# Patient Record
Sex: Male | Born: 1945 | Race: Black or African American | Hispanic: No | Marital: Married | Smoking: Former smoker
Health system: Southern US, Community
[De-identification: ages and names within clinical notes are randomized; demographics above are authoritative.]

## PROBLEM LIST (undated history)

## (undated) DIAGNOSIS — Z5189 Encounter for other specified aftercare: Secondary | ICD-10-CM

## (undated) DIAGNOSIS — Z8619 Personal history of other infectious and parasitic diseases: Secondary | ICD-10-CM

## (undated) DIAGNOSIS — Z940 Kidney transplant status: Secondary | ICD-10-CM

## (undated) DIAGNOSIS — Z7409 Other reduced mobility: Secondary | ICD-10-CM

## (undated) DIAGNOSIS — I219 Acute myocardial infarction, unspecified: Secondary | ICD-10-CM

## (undated) DIAGNOSIS — Z86718 Personal history of other venous thrombosis and embolism: Secondary | ICD-10-CM

## (undated) DIAGNOSIS — IMO0001 Reserved for inherently not codable concepts without codable children: Secondary | ICD-10-CM

## (undated) DIAGNOSIS — N186 End stage renal disease: Secondary | ICD-10-CM

## (undated) DIAGNOSIS — T86290 Cardiac allograft vasculopathy: Secondary | ICD-10-CM

## (undated) DIAGNOSIS — I255 Ischemic cardiomyopathy: Secondary | ICD-10-CM

## (undated) DIAGNOSIS — I739 Peripheral vascular disease, unspecified: Secondary | ICD-10-CM

## (undated) DIAGNOSIS — E119 Type 2 diabetes mellitus without complications: Secondary | ICD-10-CM

## (undated) DIAGNOSIS — D649 Anemia, unspecified: Secondary | ICD-10-CM

## (undated) DIAGNOSIS — Z941 Heart transplant status: Secondary | ICD-10-CM

## (undated) DIAGNOSIS — I10 Essential (primary) hypertension: Secondary | ICD-10-CM

## (undated) HISTORY — DX: Peripheral vascular disease, unspecified: I73.9

## (undated) HISTORY — PX: AV FISTULA REPAIR: SHX563

## (undated) HISTORY — PX: CORONARY ANGIOPLASTY WITH STENT PLACEMENT: SHX49

## (undated) HISTORY — PX: AV FISTULA PLACEMENT: SHX1204

---

## 1988-05-27 HISTORY — PX: CORONARY ARTERY BYPASS GRAFT: SHX141

## 1997-10-11 ENCOUNTER — Encounter: Admission: RE | Admit: 1997-10-11 | Discharge: 1998-01-09 | Payer: Self-pay | Admitting: Family Medicine

## 1997-11-08 ENCOUNTER — Emergency Department (HOSPITAL_COMMUNITY): Admission: EM | Admit: 1997-11-08 | Discharge: 1997-11-08 | Payer: Self-pay | Admitting: Emergency Medicine

## 1998-01-01 ENCOUNTER — Inpatient Hospital Stay (HOSPITAL_COMMUNITY): Admission: EM | Admit: 1998-01-01 | Discharge: 1998-01-02 | Payer: Self-pay | Admitting: Emergency Medicine

## 1998-05-16 ENCOUNTER — Encounter: Payer: Self-pay | Admitting: Emergency Medicine

## 1998-05-16 ENCOUNTER — Observation Stay (HOSPITAL_COMMUNITY): Admission: EM | Admit: 1998-05-16 | Discharge: 1998-05-17 | Payer: Self-pay | Admitting: Emergency Medicine

## 1998-08-16 ENCOUNTER — Encounter: Payer: Self-pay | Admitting: Emergency Medicine

## 1998-08-16 ENCOUNTER — Emergency Department (HOSPITAL_COMMUNITY): Admission: EM | Admit: 1998-08-16 | Discharge: 1998-08-16 | Payer: Self-pay | Admitting: Emergency Medicine

## 1998-10-23 ENCOUNTER — Observation Stay (HOSPITAL_COMMUNITY): Admission: EM | Admit: 1998-10-23 | Discharge: 1998-10-23 | Payer: Self-pay | Admitting: *Deleted

## 1998-10-23 ENCOUNTER — Encounter: Payer: Self-pay | Admitting: *Deleted

## 1998-11-06 ENCOUNTER — Ambulatory Visit (HOSPITAL_COMMUNITY): Admission: RE | Admit: 1998-11-06 | Discharge: 1998-11-06 | Payer: Self-pay | Admitting: Interventional Cardiology

## 1999-01-22 ENCOUNTER — Encounter: Payer: Self-pay | Admitting: *Deleted

## 1999-01-22 ENCOUNTER — Emergency Department (HOSPITAL_COMMUNITY): Admission: EM | Admit: 1999-01-22 | Discharge: 1999-01-22 | Payer: Self-pay | Admitting: Emergency Medicine

## 1999-06-28 HISTORY — PX: HEART TRANSPLANT: SHX268

## 1999-07-26 ENCOUNTER — Ambulatory Visit (HOSPITAL_COMMUNITY): Admission: RE | Admit: 1999-07-26 | Discharge: 1999-07-26 | Payer: Self-pay | Admitting: Interventional Cardiology

## 2003-02-02 ENCOUNTER — Emergency Department (HOSPITAL_COMMUNITY): Admission: EM | Admit: 2003-02-02 | Discharge: 2003-02-02 | Payer: Self-pay | Admitting: Emergency Medicine

## 2003-02-04 ENCOUNTER — Emergency Department (HOSPITAL_COMMUNITY): Admission: EM | Admit: 2003-02-04 | Discharge: 2003-02-04 | Payer: Self-pay | Admitting: Emergency Medicine

## 2003-02-04 ENCOUNTER — Encounter: Payer: Self-pay | Admitting: Emergency Medicine

## 2003-05-13 ENCOUNTER — Emergency Department (HOSPITAL_COMMUNITY): Admission: EM | Admit: 2003-05-13 | Discharge: 2003-05-13 | Payer: Self-pay | Admitting: Emergency Medicine

## 2003-05-14 ENCOUNTER — Inpatient Hospital Stay (HOSPITAL_COMMUNITY): Admission: EM | Admit: 2003-05-14 | Discharge: 2003-05-23 | Payer: Self-pay

## 2003-06-09 ENCOUNTER — Inpatient Hospital Stay (HOSPITAL_COMMUNITY): Admission: EM | Admit: 2003-06-09 | Discharge: 2003-06-14 | Payer: Self-pay | Admitting: Emergency Medicine

## 2003-06-16 ENCOUNTER — Encounter: Admission: RE | Admit: 2003-06-16 | Discharge: 2003-06-16 | Payer: Self-pay | Admitting: Family Medicine

## 2003-07-15 ENCOUNTER — Ambulatory Visit (HOSPITAL_COMMUNITY): Admission: RE | Admit: 2003-07-15 | Discharge: 2003-07-15 | Payer: Self-pay | Admitting: Nephrology

## 2003-07-21 ENCOUNTER — Emergency Department (HOSPITAL_COMMUNITY): Admission: EM | Admit: 2003-07-21 | Discharge: 2003-07-22 | Payer: Self-pay | Admitting: Emergency Medicine

## 2003-07-23 ENCOUNTER — Inpatient Hospital Stay (HOSPITAL_COMMUNITY): Admission: EM | Admit: 2003-07-23 | Discharge: 2003-07-25 | Payer: Self-pay | Admitting: Emergency Medicine

## 2003-08-17 ENCOUNTER — Ambulatory Visit (HOSPITAL_COMMUNITY): Admission: RE | Admit: 2003-08-17 | Discharge: 2003-08-17 | Payer: Self-pay | Admitting: Nephrology

## 2003-11-23 ENCOUNTER — Ambulatory Visit (HOSPITAL_COMMUNITY): Admission: RE | Admit: 2003-11-23 | Discharge: 2003-11-23 | Payer: Self-pay | Admitting: Nephrology

## 2003-11-29 ENCOUNTER — Ambulatory Visit (HOSPITAL_COMMUNITY): Admission: RE | Admit: 2003-11-29 | Discharge: 2003-11-29 | Payer: Self-pay | Admitting: Vascular Surgery

## 2004-05-16 ENCOUNTER — Ambulatory Visit (HOSPITAL_COMMUNITY): Admission: RE | Admit: 2004-05-16 | Discharge: 2004-05-16 | Payer: Self-pay | Admitting: Nephrology

## 2004-08-20 ENCOUNTER — Ambulatory Visit (HOSPITAL_COMMUNITY): Admission: RE | Admit: 2004-08-20 | Discharge: 2004-08-20 | Payer: Self-pay | Admitting: Nephrology

## 2004-09-12 ENCOUNTER — Ambulatory Visit (HOSPITAL_COMMUNITY): Admission: RE | Admit: 2004-09-12 | Discharge: 2004-09-12 | Payer: Self-pay | Admitting: Vascular Surgery

## 2004-09-12 ENCOUNTER — Emergency Department (HOSPITAL_COMMUNITY): Admission: EM | Admit: 2004-09-12 | Discharge: 2004-09-12 | Payer: Self-pay | Admitting: Emergency Medicine

## 2004-10-24 ENCOUNTER — Ambulatory Visit (HOSPITAL_COMMUNITY): Admission: RE | Admit: 2004-10-24 | Discharge: 2004-10-24 | Payer: Self-pay | Admitting: Nephrology

## 2004-11-23 ENCOUNTER — Ambulatory Visit (HOSPITAL_COMMUNITY): Admission: RE | Admit: 2004-11-23 | Discharge: 2004-11-23 | Payer: Self-pay | Admitting: Nephrology

## 2005-05-02 ENCOUNTER — Ambulatory Visit (HOSPITAL_COMMUNITY): Admission: RE | Admit: 2005-05-02 | Discharge: 2005-05-02 | Payer: Self-pay | Admitting: Nephrology

## 2005-09-17 ENCOUNTER — Ambulatory Visit (HOSPITAL_COMMUNITY): Admission: RE | Admit: 2005-09-17 | Discharge: 2005-09-17 | Payer: Self-pay | Admitting: *Deleted

## 2005-11-04 ENCOUNTER — Ambulatory Visit (HOSPITAL_COMMUNITY): Admission: RE | Admit: 2005-11-04 | Discharge: 2005-11-04 | Payer: Self-pay | Admitting: Nephrology

## 2006-05-21 ENCOUNTER — Emergency Department (HOSPITAL_COMMUNITY): Admission: EM | Admit: 2006-05-21 | Discharge: 2006-05-21 | Payer: Self-pay | Admitting: Emergency Medicine

## 2006-05-27 HISTORY — PX: NEPHRECTOMY TRANSPLANTED ORGAN: SUR880

## 2006-05-29 ENCOUNTER — Ambulatory Visit (HOSPITAL_COMMUNITY): Admission: RE | Admit: 2006-05-29 | Discharge: 2006-05-29 | Payer: Self-pay | Admitting: Nephrology

## 2006-11-18 ENCOUNTER — Ambulatory Visit: Payer: Self-pay | Admitting: Vascular Surgery

## 2006-11-18 ENCOUNTER — Ambulatory Visit: Admission: RE | Admit: 2006-11-18 | Discharge: 2006-11-18 | Payer: Self-pay | Admitting: Internal Medicine

## 2007-01-02 ENCOUNTER — Ambulatory Visit (HOSPITAL_COMMUNITY): Admission: RE | Admit: 2007-01-02 | Discharge: 2007-01-02 | Payer: Self-pay | Admitting: Internal Medicine

## 2007-05-18 ENCOUNTER — Emergency Department (HOSPITAL_COMMUNITY): Admission: EM | Admit: 2007-05-18 | Discharge: 2007-05-18 | Payer: Self-pay | Admitting: Emergency Medicine

## 2008-03-24 ENCOUNTER — Ambulatory Visit (HOSPITAL_COMMUNITY): Admission: RE | Admit: 2008-03-24 | Discharge: 2008-03-24 | Payer: Self-pay | Admitting: Internal Medicine

## 2008-06-02 ENCOUNTER — Ambulatory Visit: Payer: Self-pay | Admitting: Internal Medicine

## 2008-06-02 ENCOUNTER — Inpatient Hospital Stay (HOSPITAL_COMMUNITY): Admission: EM | Admit: 2008-06-02 | Discharge: 2008-06-04 | Payer: Self-pay | Admitting: Emergency Medicine

## 2008-08-27 ENCOUNTER — Emergency Department (HOSPITAL_COMMUNITY): Admission: EM | Admit: 2008-08-27 | Discharge: 2008-08-27 | Payer: Self-pay | Admitting: Emergency Medicine

## 2009-09-27 ENCOUNTER — Inpatient Hospital Stay (HOSPITAL_COMMUNITY): Admission: EM | Admit: 2009-09-27 | Discharge: 2009-10-04 | Payer: Self-pay | Admitting: Emergency Medicine

## 2009-09-27 ENCOUNTER — Ambulatory Visit: Payer: Self-pay | Admitting: Cardiology

## 2009-09-28 ENCOUNTER — Encounter (INDEPENDENT_AMBULATORY_CARE_PROVIDER_SITE_OTHER): Payer: Self-pay | Admitting: Internal Medicine

## 2009-09-29 ENCOUNTER — Ambulatory Visit: Payer: Self-pay | Admitting: Vascular Surgery

## 2010-02-15 ENCOUNTER — Ambulatory Visit: Payer: Self-pay | Admitting: Vascular Surgery

## 2010-02-21 ENCOUNTER — Ambulatory Visit: Payer: Self-pay | Admitting: Vascular Surgery

## 2010-02-21 ENCOUNTER — Ambulatory Visit (HOSPITAL_COMMUNITY): Admission: RE | Admit: 2010-02-21 | Discharge: 2010-02-21 | Payer: Self-pay | Admitting: Vascular Surgery

## 2010-03-08 ENCOUNTER — Ambulatory Visit: Payer: Self-pay | Admitting: Vascular Surgery

## 2010-06-14 ENCOUNTER — Ambulatory Visit
Admission: RE | Admit: 2010-06-14 | Discharge: 2010-06-14 | Payer: Self-pay | Source: Home / Self Care | Attending: Vascular Surgery | Admitting: Vascular Surgery

## 2010-06-14 ENCOUNTER — Ambulatory Visit: Admit: 2010-06-14 | Payer: Self-pay | Admitting: Vascular Surgery

## 2010-06-15 NOTE — Assessment & Plan Note (Signed)
OFFICE VISIT  Brandon Sharp, Brandon Sharp DOB:  01-17-1946                                       06/14/2010 [id number]#:[id number]  The patient returns for followup today.  He was last seen in October 2011.  He previously had ligation of the left upper arm graft for venous hypertension.  He had creation of a right brachiocephalic AV fistula with patch angioplasty of the cephalic vein on February 21, 2010.  He returns today for further followup.  He is currently not on dialysis. He denies any swelling or numbness or tingling in his right hand associated with the fistula.  REVIEW OF SYSTEMS:  He denies any shortness of breath or chest pain.  PHYSICAL EXAM:  Vital signs:  Blood pressure is 190/87 in the left arm, heart rate 68 and regular, respirations 18.  He has an easily palpable thrill in the right upper arm AV fistula.  It is dilated proximally but not as dilated distally.  There is an audible bruit throughout the fistula.  He had a graft duplex scan today which shows a high-grade stenosis with increased velocity, approximately 3-5 fingerbreadths above the antecubital fossa.  Velocities were 6.97 meters per second.  The fistula has increased in diameter throughout most of its course and is between 6-8 mm in diameter.  In summary, I believe the patient needs revision of his right arm AV fistula for a possible narrowing to prevent occlusion of this.  The fistula seems to be maturing throughout most of its course and hopefully by eliminating this stenosis, it would be a good usable access for him if he requires it in the future.  This would also be done to prevent occlusion of the fistula in the near term.  All this was explained to the patient today as well as risks, benefits, possible complications of procedure including but not limited to bleeding, infection, fistula thrombosis, non-maturation of the fistula.  He understands and agrees to proceed.    Janetta Hora.  Fields, MD Electronically Signed  CEF/MEDQ  D:  06/14/2010  T:  06/15/2010  Job:  [id number]  cc:   Garnetta Buddy, M.D.

## 2010-06-22 NOTE — Procedures (Unsigned)
VASCULAR LAB EXAM  INDICATION:  Follow up right arteriovenous fistula.  HISTORY: Diabetes:  No. Cardiac:  No. Hypertension:  Yes.  EXAM:  IMPRESSION: 1. Patent right brachiocephalic Cimino fistula with stenosis 3-5     fingers above the antecubital fossa, measuring 0.44 cm with     velocities at 6.97 m/s. 2. Multiple branches off the cephalic vein were visualized.  Discussed findings with Dr. Darrick Penna.  ___________________________________________ Janetta Hora Fields, MD  OD/MEDQ  D:  06/14/2010  T:  06/14/2010  Job:  [id number]

## 2010-07-02 ENCOUNTER — Ambulatory Visit (HOSPITAL_COMMUNITY): Admission: RE | Admit: 2010-07-02 | Payer: Self-pay | Source: Ambulatory Visit | Admitting: Vascular Surgery

## 2010-07-09 ENCOUNTER — Ambulatory Visit (HOSPITAL_COMMUNITY)
Admission: RE | Admit: 2010-07-09 | Discharge: 2010-07-09 | Disposition: A | Discharge status: Home / Self Care | Payer: Medicare Other | Source: Ambulatory Visit | Attending: Vascular Surgery | Admitting: Vascular Surgery

## 2010-07-09 DIAGNOSIS — N186 End stage renal disease: Secondary | ICD-10-CM

## 2010-07-09 DIAGNOSIS — T82898A Other specified complication of vascular prosthetic devices, implants and grafts, initial encounter: Secondary | ICD-10-CM

## 2010-07-09 DIAGNOSIS — Z794 Long term (current) use of insulin: Secondary | ICD-10-CM | POA: Insufficient documentation

## 2010-07-09 DIAGNOSIS — Y849 Medical procedure, unspecified as the cause of abnormal reaction of the patient, or of later complication, without mention of misadventure at the time of the procedure: Secondary | ICD-10-CM | POA: Insufficient documentation

## 2010-07-09 DIAGNOSIS — E119 Type 2 diabetes mellitus without complications: Secondary | ICD-10-CM | POA: Insufficient documentation

## 2010-07-09 DIAGNOSIS — I120 Hypertensive chronic kidney disease with stage 5 chronic kidney disease or end stage renal disease: Secondary | ICD-10-CM

## 2010-07-09 DIAGNOSIS — T82598A Other mechanical complication of other cardiac and vascular devices and implants, initial encounter: Secondary | ICD-10-CM | POA: Insufficient documentation

## 2010-07-09 LAB — GLUCOSE, CAPILLARY
Glucose-Capillary: 57 mg/dL — ABNORMAL LOW (ref 70–99)
Glucose-Capillary: 139 mg/dL — ABNORMAL HIGH (ref 70–99)

## 2010-07-09 LAB — POCT I-STAT 4, (NA,K, GLUC, HGB,HCT)
HCT: 31 % — ABNORMAL LOW (ref 39.0–52.0)
Hemoglobin: 10.5 g/dL — ABNORMAL LOW (ref 13.0–17.0)

## 2010-07-11 LAB — GLUCOSE, CAPILLARY: Glucose-Capillary: 87 mg/dL (ref 70–99)

## 2010-07-18 NOTE — Op Note (Signed)
NAME:  Brandon Sharp, Brandon Sharp              ACCOUNT NO.:  [id number]  MEDICAL RECORD NO.:  [id number]           PATIENT TYPE:  O  LOCATION:  [facility]                         FACILITY:  [facility]  PHYSICIAN:  Aariel Ems E. Zachory Mangual, MD  DATE OF BIRTH:  May 16, 1946  DATE OF PROCEDURE:  07/09/2010 DATE OF DISCHARGE:  07/09/2010                              OPERATIVE REPORT   PREOPERATIVE DIAGNOSIS:  Non-maturing right arm arteriovenous fistula.  POSTOPERATIVE DIAGNOSIS:  Non-maturing right arm arteriovenous fistula.  ANESTHESIA:  Local with IV sedation.  SURGEON:  Janetta Hora. Arayah Krouse, MD  ASSISTANT:  Pecola Leisure, PA-C.  PROCEDURE:  Patch angioplasty of right arm arteriovenous fistula with ligation of multiple side branches.  OPERATIVE FINDINGS:  Bovine pericardial patch.  OPERATIVE DETAILS:  After obtaining informed consent, the patient was taken to the operating room.  The patient was placed in supine position on the operating table.  After adequate sedation, the patient's entire right upper extremity was prepped and draped in the usual sterile fashion.  Local anesthesia was infiltrated at a preexisting longitudinal scar several centimeters above the antecubital crease.  Incision was reopened and carried down through the subcutaneous tissues down the level of a preexisting brachiocephalic AV fistula.  There had been a previous patch angioplasty of the more proximal segment of this.  This appeared to be widely patent and ultrasound also confirmed that there was a narrowing more distal to this.  Therefore, the incision was extended more longitudinally up the arm for several centimeters.  Total length of the incision was approximately 5 cm.  There was an additional narrowed spot at about 4 cm above the previous patch.  This appeared to be an area of previous needle stick or hypertrophied valve.  Vein was dissected free circumferentially at this level.  There were also 2 very large side branches  coming off the fistula and these were ligated and divided between silk ties.  The patient was given 5000 units of intravenous heparin.  The fistula was clamped proximally and distally above the area of narrowing.  A longitudinal opening was made in the narrowing.  There was thickening of the vein as well as a hypertrophied valve and some synechiae.  This segment of vein was resected approximately 1 cm.  The posterior wall was then repaired using a running 6-0 Prolene suture.  A bovine pericardial patch was then brought up in the operative field and sewn on the anterior wall as patch angioplasty using running 6-0 Prolene suture.  Just prior to completion of the anastomosis, everything was forebled, backbled, and thoroughly flushed.  Anastomosis was secured.  Clamps were released.  There was good palpable thrill in the fistula immediately.  Hemostasis was obtained with additional repair stitch.  The patient was also given 50 mg of protamine.  Subcutaneous tissues were reapproximated using running 3-0 Vicryl sutures.  Skin was closed with 4-0 Vicryl subcuticular stitch.  The patient tolerated the procedure well and there were no complications. Instrument, sponge, and needle counts were correct at the end of the case.  The patient was taken to the recovery room in stable condition.  Janetta Hora. Zenda Herskowitz, MD     CEF/MEDQ  D:  07/09/2010  T:  07/10/2010  Job:  [id number]  Electronically Signed by Fabienne Bruns MD on 07/18/2010 03:09:02 PM

## 2010-07-19 ENCOUNTER — Emergency Department (HOSPITAL_COMMUNITY): Payer: Medicare Other

## 2010-07-19 ENCOUNTER — Observation Stay (HOSPITAL_COMMUNITY)
Admission: EM | Admit: 2010-07-19 | Discharge: 2010-07-23 | Disposition: A | Discharge status: Home / Self Care | Payer: Medicare Other | Attending: Family Medicine | Admitting: Family Medicine

## 2010-07-19 DIAGNOSIS — A630 Anogenital (venereal) warts: Secondary | ICD-10-CM | POA: Insufficient documentation

## 2010-07-19 DIAGNOSIS — E1169 Type 2 diabetes mellitus with other specified complication: Principal | ICD-10-CM | POA: Insufficient documentation

## 2010-07-19 DIAGNOSIS — R4182 Altered mental status, unspecified: Secondary | ICD-10-CM | POA: Insufficient documentation

## 2010-07-19 DIAGNOSIS — I5022 Chronic systolic (congestive) heart failure: Secondary | ICD-10-CM | POA: Insufficient documentation

## 2010-07-19 DIAGNOSIS — X31XXXA Exposure to excessive natural cold, initial encounter: Secondary | ICD-10-CM | POA: Insufficient documentation

## 2010-07-19 DIAGNOSIS — I129 Hypertensive chronic kidney disease with stage 1 through stage 4 chronic kidney disease, or unspecified chronic kidney disease: Secondary | ICD-10-CM | POA: Insufficient documentation

## 2010-07-19 DIAGNOSIS — D696 Thrombocytopenia, unspecified: Secondary | ICD-10-CM | POA: Insufficient documentation

## 2010-07-19 DIAGNOSIS — Z941 Heart transplant status: Secondary | ICD-10-CM | POA: Insufficient documentation

## 2010-07-19 DIAGNOSIS — T68XXXA Hypothermia, initial encounter: Secondary | ICD-10-CM | POA: Insufficient documentation

## 2010-07-19 DIAGNOSIS — D649 Anemia, unspecified: Secondary | ICD-10-CM | POA: Insufficient documentation

## 2010-07-19 DIAGNOSIS — K5900 Constipation, unspecified: Secondary | ICD-10-CM | POA: Insufficient documentation

## 2010-07-19 DIAGNOSIS — Z794 Long term (current) use of insulin: Secondary | ICD-10-CM | POA: Insufficient documentation

## 2010-07-19 DIAGNOSIS — N184 Chronic kidney disease, stage 4 (severe): Secondary | ICD-10-CM | POA: Insufficient documentation

## 2010-07-19 LAB — POCT I-STAT, CHEM 8
Chloride: 110 mEq/L (ref 96–112)
Creatinine, Ser: 4.9 mg/dL — ABNORMAL HIGH (ref 0.4–1.5)
Glucose, Bld: 79 mg/dL (ref 70–99)
HCT: 31 % — ABNORMAL LOW (ref 39.0–52.0)
Hemoglobin: 10.5 g/dL — ABNORMAL LOW (ref 13.0–17.0)
Potassium: 4.1 mEq/L (ref 3.5–5.1)
Sodium: 145 mEq/L (ref 135–145)
TCO2: 27 mmol/L (ref 0–100)

## 2010-07-19 LAB — GLUCOSE, CAPILLARY
Glucose-Capillary: 162 mg/dL — ABNORMAL HIGH (ref 70–99)
Glucose-Capillary: 168 mg/dL — ABNORMAL HIGH (ref 70–99)
Glucose-Capillary: 187 mg/dL — ABNORMAL HIGH (ref 70–99)

## 2010-07-19 LAB — PROCALCITONIN: Procalcitonin: 0.12 ng/mL

## 2010-07-19 LAB — CBC
MCV: 99.4 fL (ref 78.0–100.0)
Platelets: 108 10*3/uL — ABNORMAL LOW (ref 150–400)

## 2010-07-19 LAB — LACTIC ACID, PLASMA: Lactic Acid, Venous: 0.9 mmol/L (ref 0.5–2.2)

## 2010-07-19 LAB — DIFFERENTIAL
Basophils Absolute: 0 10*3/uL (ref 0.0–0.1)
Basophils Relative: 0 % (ref 0–1)

## 2010-07-20 LAB — URINALYSIS, MICROSCOPIC ONLY
Hgb urine dipstick: NEGATIVE
Ketones, ur: NEGATIVE mg/dL
Protein, ur: 100 mg/dL — AB
Urobilinogen, UA: 0.2 mg/dL (ref 0.0–1.0)

## 2010-07-20 LAB — GLUCOSE, CAPILLARY
Glucose-Capillary: 45 mg/dL — ABNORMAL LOW (ref 70–99)
Glucose-Capillary: 77 mg/dL (ref 70–99)
Glucose-Capillary: 218 mg/dL — ABNORMAL HIGH (ref 70–99)

## 2010-07-21 LAB — BASIC METABOLIC PANEL
CO2: 27 mEq/L (ref 19–32)
Chloride: 107 mEq/L (ref 96–112)
GFR calc Af Amer: 16 mL/min — ABNORMAL LOW (ref >60)
Potassium: 3.8 mEq/L (ref 3.5–5.1)
Sodium: 144 mEq/L (ref 135–145)

## 2010-07-21 LAB — CBC
Hemoglobin: 10 g/dL — ABNORMAL LOW (ref 13.0–17.0)
MCH: 30.2 pg (ref 26.0–34.0)
RBC: 3.31 MIL/uL — ABNORMAL LOW (ref 4.22–5.81)
WBC: 4.3 10*3/uL (ref 4.0–10.5)

## 2010-07-21 LAB — GLUCOSE, CAPILLARY: Glucose-Capillary: 128 mg/dL — ABNORMAL HIGH (ref 70–99)

## 2010-07-22 LAB — URINE CULTURE: Colony Count: 10000

## 2010-07-22 LAB — BASIC METABOLIC PANEL
CO2: 28 mEq/L (ref 19–32)
Calcium: 8.5 mg/dL (ref 8.4–10.5)
Chloride: 104 mEq/L (ref 96–112)
Creatinine, Ser: 4.28 mg/dL — ABNORMAL HIGH (ref 0.4–1.5)
GFR calc Af Amer: 17 mL/min — ABNORMAL LOW (ref >60)
Sodium: 140 mEq/L (ref 135–145)

## 2010-07-22 LAB — GLUCOSE, CAPILLARY
Glucose-Capillary: 109 mg/dL — ABNORMAL HIGH (ref 70–99)
Glucose-Capillary: 241 mg/dL — ABNORMAL HIGH (ref 70–99)
Glucose-Capillary: 195 mg/dL — ABNORMAL HIGH (ref 70–99)

## 2010-07-23 LAB — BASIC METABOLIC PANEL
Calcium: 8.9 mg/dL (ref 8.4–10.5)
GFR calc Af Amer: 17 mL/min — ABNORMAL LOW (ref >60)
GFR calc non Af Amer: 14 mL/min — ABNORMAL LOW (ref >60)
Glucose, Bld: 125 mg/dL — ABNORMAL HIGH (ref 70–99)
Potassium: 4 mEq/L (ref 3.5–5.1)
Sodium: 144 mEq/L (ref 135–145)

## 2010-07-25 ENCOUNTER — Encounter (HOSPITAL_COMMUNITY): Payer: Medicare Other | Attending: Urology

## 2010-07-25 DIAGNOSIS — Z139 Encounter for screening, unspecified: Secondary | ICD-10-CM | POA: Insufficient documentation

## 2010-07-26 ENCOUNTER — Ambulatory Visit (INDEPENDENT_AMBULATORY_CARE_PROVIDER_SITE_OTHER): Payer: Medicare Other

## 2010-07-26 DIAGNOSIS — N186 End stage renal disease: Secondary | ICD-10-CM

## 2010-07-26 LAB — CULTURE, BLOOD (ROUTINE X 2)
Culture  Setup Time: 201202240124
Culture: NO GROWTH

## 2010-07-26 NOTE — Assessment & Plan Note (Signed)
OFFICE VISIT  Brandon Sharp, Brandon Sharp DOB:  April 28, 1946                                       07/26/2010 [id number]#:[id number]  DATE OF SURGERY:  07/09/2010.  The patient presents today for follow-up of patch angioplasty of right arm, AV fistula with ligation of multiple side branches.  Procedure was performed in order to aid with maturation of his right upper arm AV fistula.  The patient is doing well and is without complaint.  He denies signs and symptoms of steal.  The patient is not on dialysis at this time.  PHYSICAL EXAMINATION:  There are 2+ radial and ulnar pulses present in the right upper extremity.  There is a great thrill in the fistula.  The incision is clean, dry and intact and healing well.  Hand is warm.  Cap refill is good.  Motor and sensation are intact in the right upper extremity.  ASSESSMENT/PLAN:  Nonmaturing right arm AV fistula status post patch angioplasty with bovine pericardial patch and ligation of multiple side branches.  PLAN:  The patient is doing well.  The fistula has an excellent thrill but at this time has not matured fully.  The patient will follow up in 3 months with Dr. Darrick Penna with a graft ultrasound to reassess status.  The patient is not on hemodialysis, and if he were to require initiation prior to his follow-up, the nephrology office will contact us and a Diatek catheter will be placed at that time.  Pecola Leisure, PA  Charles E. Fields, MD Electronically Signed  AY/MEDQ  D:  07/26/2010  T:  07/26/2010  Job:  [id number]

## 2010-07-29 LAB — FUNGUS CULTURE, BLOOD: Culture: NO GROWTH

## 2010-08-02 ENCOUNTER — Ambulatory Visit (HOSPITAL_COMMUNITY)
Admission: RE | Admit: 2010-08-02 | Discharge: 2010-08-02 | Disposition: A | Discharge status: Home / Self Care | Payer: Medicare Other | Source: Ambulatory Visit | Attending: Urology | Admitting: Urology

## 2010-08-02 ENCOUNTER — Other Ambulatory Visit: Payer: Self-pay | Admitting: Urology

## 2010-08-02 DIAGNOSIS — N186 End stage renal disease: Secondary | ICD-10-CM | POA: Insufficient documentation

## 2010-08-02 DIAGNOSIS — E119 Type 2 diabetes mellitus without complications: Secondary | ICD-10-CM | POA: Insufficient documentation

## 2010-08-02 DIAGNOSIS — Z940 Kidney transplant status: Secondary | ICD-10-CM | POA: Insufficient documentation

## 2010-08-02 DIAGNOSIS — C632 Malignant neoplasm of scrotum: Secondary | ICD-10-CM | POA: Insufficient documentation

## 2010-08-02 DIAGNOSIS — A630 Anogenital (venereal) warts: Secondary | ICD-10-CM | POA: Insufficient documentation

## 2010-08-02 DIAGNOSIS — I120 Hypertensive chronic kidney disease with stage 5 chronic kidney disease or end stage renal disease: Secondary | ICD-10-CM | POA: Insufficient documentation

## 2010-08-02 DIAGNOSIS — Z941 Heart transplant status: Secondary | ICD-10-CM | POA: Insufficient documentation

## 2010-08-02 DIAGNOSIS — I252 Old myocardial infarction: Secondary | ICD-10-CM | POA: Insufficient documentation

## 2010-08-02 DIAGNOSIS — Z01812 Encounter for preprocedural laboratory examination: Secondary | ICD-10-CM | POA: Insufficient documentation

## 2010-08-02 LAB — BASIC METABOLIC PANEL
CO2: 28 mEq/L (ref 19–32)
Chloride: 105 mEq/L (ref 96–112)
GFR calc Af Amer: 14 mL/min — ABNORMAL LOW (ref >60)
Potassium: 4.2 mEq/L (ref 3.5–5.1)
Sodium: 142 mEq/L (ref 135–145)

## 2010-08-02 NOTE — Discharge Summary (Signed)
NAME:  Brandon Sharp, WEINREB NO.:  [id number]  MEDICAL RECORD NO.:  [id number]           PATIENT TYPE:  I  LOCATION:  4504                         FACILITY:  [facility]  PHYSICIAN:  Brendia Sacks, MD    DATE OF BIRTH:  April 11, 1946  DATE OF ADMISSION:  07/19/2010 DATE OF DISCHARGE:  07/23/2010                              DISCHARGE SUMMARY   PRIMARY CARE PHYSICIAN:  Dr. Bayard Beaver.  CONDITION ON DISCHARGE:  Improved.  DISCHARGE DIAGNOSES: 1. Hypoglycemia and hypothermia, resolved. 2. Constipation, resolved. 3. Diabetes mellitus, insulin requiring. 4. 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 5. Hypertension, stable. 6. Chronic anemia and thrombocytopenia, stable.  HISTORY OF PRESENT ILLNESS:  A 65 year old man, who presented to the emergency room with decreased responsiveness.  His wife found him to be hypoglycemic in the afternoon, and he was noted to be hypothermic. Given his history of kidney and heart transplant, he was admitted for an infectious workup and placed on empiric antibiotic therapy.  HOSPITAL COURSE: 1. Hypoglycemia and hypothermia.  This rapidly resolved.  Blood     cultures remain negative.  There was no evidence to suggest     infection.  He has no localizing symptoms.  His hypoglycemia is     resolved, and he is now stable for discharge. 2. Constipation.  This resolved with the usual treatment. 3. Diabetes mellitus, insulin requiring.  The patient has not required     insulin during his hospital stay here; however, he appears to be     quite insulin resistant.  He tells me that usually in the morning,     his blood sugars are between 60 and 70, and he takes his 50 units     of Lantus at that time.  He then covers himself with 2 units of     sliding scale in the morning.  He then uses a sliding scale for     lunch and dinner.  He reports his blood sugars usually run in the     low 100s.  I suspect at this point that he can resume  his Lantus     even though he has not been on it here.  He is likely quite insulin     resistant.  I have asked him to resume a half-dose tomorrow and     follow his  blood sugars and then titrate back up to his usual     dose.  He believes his hypoglycemic event was secondary to not     eating. 4. Genital warts.  The patient will be having this removed soon.  He     will be given a pain prescription to take in the interim. 5. 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  The patient follows     with Vascular Surgery and Nephrology.  His baseline creatinine last     data point was in September 2011, when his baseline creatinine was     approximately 3.5.  He has been as high as mid 5.  His creatinine     on admission was 4.9 and continues  to improve at 4.28 on discharge.     BUN continues to trend downwards.  He has been maintained on his     diuretic therapy during this hospitalization.  Urine output has     been excellent. 6. Hypertension.  This appears to be stable.  He continues on his     chronic medications.  I have reviewed the patient's records in e-     chart, and his blood pressure seems to be at baseline at this     point.  Therefore, we will make no changes. 7. Chronic anemia and thrombocytopenia.  This remained stable.  CONSULTATIONS:  None.  PROCEDURES:  None.  IMAGING: 1. Chest x-ray on July 19, 2010:  Stable cardiomegaly and postop     changes. 2. CT of the head on July 19, 2010:  No acute abnormality.  MICROBIOLOGY: 1. Blood cultures x2 on July 19, 2010; no growth to date. 2. Fungal blood cultures x2 on July 20, 2010:  No growth to date. 3. Urine culture on July 20, 2010:  10,000 colonies, multiple     bacterial morphotypes present, none predominant.  PERTINENT LABORATORY STUDIES: 1. CBC notable for hemoglobin 10.0 which is stable and platelet count     of 118, which is stable. 2. Capillary blood sugars stable without hypoglycemia. 3. BUN  78 on discharge, creatinine 4.28 on discharge.  On admission     creatinine was 4.9 and BUN was 96.  PHYSICAL EXAMINATION:  GENERAL:  On discharge, the patient is feeling well.  He has no complaints. VITAL SIGNS:  Temperature is 97.7, pulse 74, respirations 18, blood pressure 164/80, sat 92% on room air. CARDIOVASCULAR:  Regular rate and rhythm. RESPIRATORY:  Clear to auscultation bilaterally.  No wheezes, rales, or rhonchi. PSYCHIATRIC:  Affect is bright.  DISCHARGE INSTRUCTIONS:  The patient will be discharged home today. Diet is diabetic, heart-healthy diet.  Activity as tolerated.  Followup with Dr. Antony Haste in approximately 1 week.  DISCHARGE MEDICATIONS: 1. Lortab 5/325 one tablet every 4 hours as needed for pain, #20, no     refills. 2. Famotidine has been changed to 20 mg p.o. daily given the patient's     kidney function. 3. Atorvastatin 40 mg p.o. at bedtime. 4. Azathioprine 50 mg 1/2 tablet by mouth daily. 5. Carvedilol 25 mg 2 tablets p.o. b.i.d. 6. Doxazosin 4 mg p.o. b.i.d. 7. Iron sulfate 324 mg p.o. every evening. 8. Humalog 48 units subcutaneous 3 times a day, sliding scale as     directed. 9. Hydralazine 50 mg 2 tablets by mouth every 8 hours. 10.Isordil 20 mg 1 tablet every 8 hours. 11.Klor-Con 20 mEq 2 tablets p.o. b.i.d. 12.Lantus take 25 units subcu on July 24, 2010.  If blood sugar     remains high, increase to 35 units subcu on July 25, 2010.  If     blood sugar continues to remain high, resume usual dose of 50 units     daily.  If blood sugar remains low, call your primary care     physician. 13.Metolazone 5 mg for shortness of breath or 3 pounds weight gain for     24 hours daily as needed. 14.Multivitamin p.o. daily. 15.Prednisone 5 mg 2 tablets p.o. daily. 16.Prograf 1 capsule p.o. every 12 hours. 17.Sodium bicarbonate 650 mg p.o. b.i.d. 18.Torsemide 100 mg p.o. b.i.d. 19.WelChol 625 mg 6 tablets p.o. at bedtime. 20.Zemplar 1 mcg 1  capsule every Monday, Wednesday, and Friday.  TIME COORDINATING DISCHARGE:  Thirty minutes.     Brendia Sacks, MD     DG/MEDQ  D:  07/23/2010  T:  07/24/2010  Job:  [id number]  cc:   Cindee Lame Dr. Cyndia Bent  Electronically Signed by Brendia Sacks  on 08/01/2010 09:09:56 PM

## 2010-08-02 NOTE — H&P (Signed)
NAME:  Brandon Sharp, STANFORTH NO.:  [id number]  MEDICAL RECORD NO.:  [id number]           PATIENT TYPE:  E  LOCATION:  [facility]                         FACILITY:  [facility]  PHYSICIAN:  Calvert Cantor, M.D.     DATE OF BIRTH:  04-05-1946  DATE OF ADMISSION:  07/19/2010 DATE OF DISCHARGE:                             HISTORY & PHYSICAL   PRIMARY CARE PHYSICIAN:  Bayard Beaver, MD, [facility]  PRESENTING COMPLIANT:  Decreased responsiveness.  HISTORY OF PRESENT ILLNESS:  This is a 65 year old male who is status post cardiac transplant and renal transplant with chronic kidney disease, diabetes mellitus, hypertension, and chronic thrombocytopenia.  The patient usually takes Lantus in the morning and NovoLog with each meal.  He took his usual dose of Lantus 50 units this morning and took 2 units of NovoLog with his breakfast which is not uncommon for him.  His wife states he usually sleeps in the afternoon but she went to wake him up around 3 o'clock this afternoon and he was not waking.  He eventually woke up slightly and was mumbling and was confused.  He was found to be hypoglycemic.  EMS gave him glucagon.  He was given D50 in the ER and blood sugars have improved.  It was also noted that he was hypothermic on arrival with a temperature of 93.7 which is improved with [employer].  Wife states that it is not unusual for the patient to miss lunch occasionally and he has not gotten hypoglycemic in the past when he has missed lunch.  He had a normal breakfast this morning similar to his daily breakfast.  The patient has not felt any fever, chills, or sweats recently.  No upper respiratory tract infection.  No burning, micturition, dysuria, or increased frequency of micturition.  No nausea, vomiting, or diarrhea. He did have some mid abdominal pain earlier today but this has resolved. This is not uncommon for him.  No new rash.  I am unable to find  any other symptoms of possible infection.  PAST MEDICAL HISTORY: 1. Ischemic cardiomyopathy with a resultant heart transplant in 2001. 2. 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  For some reason, a fistula was placed in his     arm this past fall.  He had a revision of this fistula about a week     ago.  This is in anticipation of dialysis. 3. Diabetes mellitus, insulin requiring. 4. Hyperlipidemia. 5. Hypertension. 6. Chronic thrombocytopenia. 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 8. Immunosuppression. 9. The patient has chronic systolic heart failure with EF of 40-45%,     diffuse hypokinesis, mild-to-moderate aortic regurgitation, and     moderate mitral valve regurgitation.  ALLERGIES: 1. PENICILLIN causes a rash. 2. LISINOPRIL causes swelling of his tongue. 3. NORVASC causes itching.  MEDICATIONS: 1. Azathioprine 50 mg 1.5 tablet daily. 2. Carvedilol 25 mg 2 tablets twice a day. 3. Famotidine 20 mg 1 tablet every 12 hours. 4. Hydralazine 50 mg 2 tablets q.8 h. 5. Klor-Con M 20 mEq 2  tablets twice a day. 6. Prednisone 5 mg 2 tablets daily. 7. Prograf 1 mg 1 capsule every 12 hours. 8. Doxazosin 4 mg 1 tablet twice a day. 9. Welchol 625 mg 6 tablets every evening. 10.Isordil 40 mg 1 tablet q.8 h. 11.Torsemide 100 mg 1 tablet twice a day. 12.Atorvastatin 40 mg at bedtime. 13.Multivitamin 1 tablet daily. 14.Metolazone 5 mg daily as needed for shortness of breath or weight     gain. 15.Lantus 50 units every morning. 16.Humalog, he takes anywhere from 4-8 units based on a sliding scale     3 times a day with meals. 17.Ferrous sulfate 325 mg 1 tablet every evening. 18.Sodium bicarbonate 650 mg 1 tablet twice a day. 19.Zemplar 1 mcg every Monday, Wednesday, and Friday.  REVIEW OF SYSTEMS:  CONSTITUTIONAL:  No recent weight loss or weight gain.  HEENT:  He does have frequent headaches.  No sore  throat, sinus trouble, or earache.  RESPIRATORY:  No shortness of breath or cough.  No chest pain or palpitations.  EXTREMITIES:  He has chronic pedal edema in the right leg.  GI:  No nausea, vomiting, diarrhea, or constipation.  He occasionally has diarrhea and mid abdominal pain but has not had any diarrhea recently.  GU:  No dysuria, hematuria, or incontinence. HEMATOLOGIC:  No easy bruising.  SKIN:  He has a mole, no new rashes. NEUROLOGIC:  No focal numbness, weakness, or tingling.  No history of strokes or seizures.  PSYCHOLOGIC:  No anxiety or depression.  PHYSICAL EXAMINATION:  VITAL SIGNS:  Blood pressure 170/74, pulse 54, respiratory rate 18, temperature 93.7, and oxygen saturation 97% on room air. HEENT:  Pupils equal, round, and reactive to light.  Extraocular movements are intact.  Conjunctivae is pink.  No scleral icterus.  Oral mucosa is moist.  Oropharynx clear. NECK:  Supple.  No thyromegaly, lymphadenopathy, or carotid bruits. HEART:  Regular rate and rhythm.  No murmurs, rubs, or gallops. LUNGS:  Clear bilaterally.  Normal respiratory effort.  No use of accessory muscles. ABDOMEN:  Soft, nontender, and nondistended.  Bowel sounds positive.  No organomegaly. EXTREMITIES:  No cyanosis or clubbing.  There is 2+ pitting edema of the left leg. NEUROLOGIC:  Cranial nerves II-XII are intact.  Strength is intact in all 4 extremities. PSYCHOLOGIC:  Awake, alert, and oriented x3.  Mood and affect normal. SKIN:  Warm and dry.  No rash or bruising.  LABORATORY DATA:  Blood work:  Lactic acid is normal at 0.9. Procalcitonin is negative at 0.12.  CBC reveals anemia with hemoglobin of 10.1, hematocrit 32.3, and platelets are slightly low at 108. Metabolic panel reveals a BUN of 96 and a creatinine of 4.9 which is only mildly elevated from September 2011 when it was 61 and 3.41.  CT scan of the head without contrast does not reveal any acute abnormality.  Chest x-ray portable  reveals stable cardiomegaly.  ASSESSMENT AND PLAN: 1. Hypoglycemia.  It appears that this has corrected and I am unsure     of the inciting event.  There is a concern that he may have been     septic which is why he is on antibiotics.  I would agree with this     and would recommend following up his blood cultures.  Obviously, if     they are negative, antibiotics can be discontinued. 2. Hypothermia.  Again, possibly related to sepsis versus prolonged     hypoglycemia.  This has resolved as well. 3. Chronic kidney  disease, appears to be stage IV. 4. Insulin-dependent diabetes mellitus.  We will cut back on his     Lantus to 40 units in the morning and hold off on placing him on a     sliding scale.  We will continue carb-controlled diet however. 5. Chronic systolic heart failure. 6. Hypertension.  The patient wants to be a limited code.  He does want CPR but does not want intubation.  TIME ON ADMISSION:  60 minutes.     Calvert Cantor, M.D.     SR/MEDQ  D:  07/19/2010  T:  07/19/2010  Job:  [id number]  cc:   Bayard Beaver, M.D.  Electronically Signed by Calvert Cantor M.D. on 08/01/2010 12:26:09 PM

## 2010-08-09 LAB — CBC
Hemoglobin: 10.7 g/dL — ABNORMAL LOW (ref 13.0–17.0)
MCH: 29.5 pg (ref 26.0–34.0)
MCV: 93.4 fL (ref 78.0–100.0)
RBC: 3.63 MIL/uL — ABNORMAL LOW (ref 4.22–5.81)

## 2010-08-09 LAB — COMPREHENSIVE METABOLIC PANEL
AST: 18 U/L (ref 0–37)
BUN: 61 mg/dL — ABNORMAL HIGH (ref 6–23)
CO2: 23 mEq/L (ref 19–32)
Chloride: 111 mEq/L (ref 96–112)
Creatinine, Ser: 3.41 mg/dL — ABNORMAL HIGH (ref 0.4–1.5)
GFR calc Af Amer: 22 mL/min — ABNORMAL LOW (ref >60)
GFR calc non Af Amer: 18 mL/min — ABNORMAL LOW (ref >60)
Total Bilirubin: 0.6 mg/dL (ref 0.3–1.2)

## 2010-08-09 LAB — GLUCOSE, CAPILLARY: Glucose-Capillary: 94 mg/dL (ref 70–99)

## 2010-08-13 ENCOUNTER — Other Ambulatory Visit (HOSPITAL_COMMUNITY): Payer: Self-pay | Admitting: Urology

## 2010-08-13 DIAGNOSIS — A630 Anogenital (venereal) warts: Secondary | ICD-10-CM

## 2010-08-13 DIAGNOSIS — C632 Malignant neoplasm of scrotum: Secondary | ICD-10-CM

## 2010-08-13 NOTE — Op Note (Signed)
  NAME:  Brandon Sharp, GROESBECK              ACCOUNT NO.:  [id number]  MEDICAL RECORD NO.:  [id number]           PATIENT TYPE:  O  LOCATION:  DAYL                         FACILITY[facility]  PHYSICIAN:  Bettie Swavely I. Patsi Sears, M.D.DATE OF BIRTH:  07-05-1945  DATE OF PROCEDURE:  08/02/2010 DATE OF DISCHARGE:                              OPERATIVE REPORT   PREOPERATIVE DIAGNOSIS:  Multiple giant scrotal and inguinal condyloma.  POSTOPERATIVE DIAGNOSIS:  Multiple giant scrotal and inguinal condyloma.  OPERATION:  Excision of multiple giant scrotal and inguinal condyloma.  SURGEON:  Leeandre Nordling I. Patsi Sears, M.D.  ANESTHESIA:  General LMA.  PREPARATION:  After appropriate preanesthesia, the patient was brought to the operating room, placed on the operating room in dorsal supine position where general LMA anesthesia was introduced.  He was then replaced in dorsal lithotomy position where the pubis was prepped with Betadine solution and draped in usual fashion.  BRIEF HISTORY:  This 65 year old male was referred by Dr. Cyndia Bent for examination of large scrotal mass, which is fraying for 1 month.  He is status post heart and kidney transplant at [facility] and in [place].  The patient has chosen to be taken care of in [place] and is cared for by Dr. Daphine Deutscher wrap.  Note, has past history of hypoglycemia, diabetes, chronic kidney disease and hypertension.  DESCRIPTION OF PROCEDURE:  Examination of the scrotum and penis shows multiple warts identified, with giant condyloma noted in the left inguinal canal with multiple satellite lesions.  The small warts are cauterized, the medium warts are excised, and the giant condyloma are excised with using the electrosurgical unit.  All bleeding is electrocoagulated.  Double suction is used during the case to exhaust any wart fumes.  The wound is irrigated with double antibiotic irrigation and subcutaneous tissue was closed with 3-0 Vicryl suture.   A giant condyloma excision is closed in the left inguinal area with 3-0 running PDS suture and the smaller excisions are closed with 4-0 Monocryl suture.  The patient tolerated the procedure well.  Note that he had blood pressure issues during the surgery, which were corrected by anesthesia.  The patient was awakened and taken to recovery in good condition.     Raiden Haydu I. Patsi Sears, M.D.     SIT/MEDQ  D:  08/02/2010  T:  08/03/2010  Job:  [id number]  cc:   Dr. Glenna Fellows, M.D. Fax: [phone]  Electronically Signed by Jethro Bolus M.D. on 08/13/2010 12:35:32 PM

## 2010-08-14 LAB — BASIC METABOLIC PANEL WITH GFR
BUN: 47 mg/dL — ABNORMAL HIGH (ref 6–23)
BUN: 50 mg/dL — ABNORMAL HIGH (ref 6–23)
BUN: 52 mg/dL — ABNORMAL HIGH (ref 6–23)
CO2: 23 meq/L (ref 19–32)
CO2: 25 meq/L (ref 19–32)
CO2: 25 meq/L (ref 19–32)
Calcium: 8.5 mg/dL (ref 8.4–10.5)
Calcium: 8.5 mg/dL (ref 8.4–10.5)
Calcium: 8.8 mg/dL (ref 8.4–10.5)
Chloride: 109 meq/L (ref 96–112)
Chloride: 111 meq/L (ref 96–112)
Chloride: 112 meq/L (ref 96–112)
Creatinine, Ser: 3.45 mg/dL — ABNORMAL HIGH (ref 0.4–1.5)
Creatinine, Ser: 3.46 mg/dL — ABNORMAL HIGH (ref 0.4–1.5)
Creatinine, Ser: 3.48 mg/dL — ABNORMAL HIGH (ref 0.4–1.5)
GFR calc non Af Amer: 18 mL/min — ABNORMAL LOW
GFR calc non Af Amer: 18 mL/min — ABNORMAL LOW
GFR calc non Af Amer: 18 mL/min — ABNORMAL LOW
Glucose, Bld: 119 mg/dL — ABNORMAL HIGH (ref 70–99)
Glucose, Bld: 124 mg/dL — ABNORMAL HIGH (ref 70–99)
Glucose, Bld: 131 mg/dL — ABNORMAL HIGH (ref 70–99)
Potassium: 4.1 meq/L (ref 3.5–5.1)
Potassium: 4.3 meq/L (ref 3.5–5.1)
Potassium: 4.6 meq/L (ref 3.5–5.1)
Sodium: 141 meq/L (ref 135–145)
Sodium: 141 meq/L (ref 135–145)
Sodium: 142 meq/L (ref 135–145)

## 2010-08-14 LAB — CBC
HCT: 31.6 % — ABNORMAL LOW (ref 39.0–52.0)
HCT: 36.8 % — ABNORMAL LOW (ref 39.0–52.0)
Hemoglobin: 10.6 g/dL — ABNORMAL LOW (ref 13.0–17.0)
Hemoglobin: 12.5 g/dL — ABNORMAL LOW (ref 13.0–17.0)
MCHC: 33.5 g/dL (ref 30.0–36.0)
MCHC: 34.1 g/dL (ref 30.0–36.0)
MCV: 90 fL (ref 78.0–100.0)
MCV: 90.1 fL (ref 78.0–100.0)
Platelets: 100 10*3/uL — ABNORMAL LOW (ref 150–400)
RBC: 3.51 MIL/uL — ABNORMAL LOW (ref 4.22–5.81)
RDW: 16.6 % — ABNORMAL HIGH (ref 11.5–15.5)
RDW: 16.8 % — ABNORMAL HIGH (ref 11.5–15.5)
WBC: 4.7 10*3/uL (ref 4.0–10.5)

## 2010-08-14 LAB — BASIC METABOLIC PANEL
BUN: 42 mg/dL — ABNORMAL HIGH (ref 6–23)
BUN: 44 mg/dL — ABNORMAL HIGH (ref 6–23)
CO2: 24 mEq/L (ref 19–32)
CO2: 25 mEq/L (ref 19–32)
Calcium: 8.4 mg/dL (ref 8.4–10.5)
Calcium: 8.8 mg/dL (ref 8.4–10.5)
Calcium: 8.9 mg/dL (ref 8.4–10.5)
Chloride: 110 mEq/L (ref 96–112)
Chloride: 110 mEq/L (ref 96–112)
Creatinine, Ser: 3.3 mg/dL — ABNORMAL HIGH (ref 0.4–1.5)
GFR calc Af Amer: 22 mL/min — ABNORMAL LOW (ref >60)
GFR calc Af Amer: 24 mL/min — ABNORMAL LOW (ref >60)
GFR calc non Af Amer: 18 mL/min — ABNORMAL LOW (ref >60)
GFR calc non Af Amer: 19 mL/min — ABNORMAL LOW (ref >60)
Glucose, Bld: 83 mg/dL (ref 70–99)
Potassium: 3.9 mEq/L (ref 3.5–5.1)
Sodium: 139 mEq/L (ref 135–145)
Sodium: 143 mEq/L (ref 135–145)
Sodium: 144 mEq/L (ref 135–145)
Sodium: 144 mEq/L (ref 135–145)

## 2010-08-14 LAB — GLUCOSE, CAPILLARY
Glucose-Capillary: 101 mg/dL — ABNORMAL HIGH (ref 70–99)
Glucose-Capillary: 107 mg/dL — ABNORMAL HIGH (ref 70–99)
Glucose-Capillary: 108 mg/dL — ABNORMAL HIGH (ref 70–99)
Glucose-Capillary: 113 mg/dL — ABNORMAL HIGH (ref 70–99)
Glucose-Capillary: 114 mg/dL — ABNORMAL HIGH (ref 70–99)
Glucose-Capillary: 120 mg/dL — ABNORMAL HIGH (ref 70–99)
Glucose-Capillary: 122 mg/dL — ABNORMAL HIGH (ref 70–99)
Glucose-Capillary: 123 mg/dL — ABNORMAL HIGH (ref 70–99)
Glucose-Capillary: 138 mg/dL — ABNORMAL HIGH (ref 70–99)
Glucose-Capillary: 139 mg/dL — ABNORMAL HIGH (ref 70–99)
Glucose-Capillary: 149 mg/dL — ABNORMAL HIGH (ref 70–99)
Glucose-Capillary: 161 mg/dL — ABNORMAL HIGH (ref 70–99)
Glucose-Capillary: 169 mg/dL — ABNORMAL HIGH (ref 70–99)
Glucose-Capillary: 177 mg/dL — ABNORMAL HIGH (ref 70–99)
Glucose-Capillary: 178 mg/dL — ABNORMAL HIGH (ref 70–99)
Glucose-Capillary: 179 mg/dL — ABNORMAL HIGH (ref 70–99)
Glucose-Capillary: 197 mg/dL — ABNORMAL HIGH (ref 70–99)
Glucose-Capillary: 202 mg/dL — ABNORMAL HIGH (ref 70–99)
Glucose-Capillary: 207 mg/dL — ABNORMAL HIGH (ref 70–99)
Glucose-Capillary: 241 mg/dL — ABNORMAL HIGH (ref 70–99)
Glucose-Capillary: 84 mg/dL (ref 70–99)
Glucose-Capillary: 92 mg/dL (ref 70–99)

## 2010-08-14 LAB — CK TOTAL AND CKMB (NOT AT ARMC): Relative Index: 3.4 — ABNORMAL HIGH (ref 0.0–2.5)

## 2010-08-14 LAB — TACROLIMUS LEVEL: Tacrolimus (FK506) - LabCorp: 5.7 ng/mL

## 2010-08-14 LAB — CARDIAC PANEL(CRET KIN+CKTOT+MB+TROPI)
CK, MB: 4.8 ng/mL — ABNORMAL HIGH (ref 0.3–4.0)
Relative Index: 3.2 — ABNORMAL HIGH (ref 0.0–2.5)
Total CK: 133 U/L (ref 7–232)
Troponin I: 0.1 ng/mL — ABNORMAL HIGH (ref 0.00–0.06)

## 2010-08-14 LAB — URINALYSIS, ROUTINE W REFLEX MICROSCOPIC
Glucose, UA: NEGATIVE mg/dL
Hgb urine dipstick: NEGATIVE
Protein, ur: >300 mg/dL — AB
pH: 5.5 (ref 5.0–8.0)

## 2010-08-14 LAB — URINE CULTURE

## 2010-08-14 LAB — COMPREHENSIVE METABOLIC PANEL
BUN: 46 mg/dL — ABNORMAL HIGH (ref 6–23)
CO2: 24 mEq/L (ref 19–32)
Calcium: 9 mg/dL (ref 8.4–10.5)
GFR calc non Af Amer: 17 mL/min — ABNORMAL LOW (ref >60)
Glucose, Bld: 134 mg/dL — ABNORMAL HIGH (ref 70–99)
Total Protein: 6.6 g/dL (ref 6.0–8.3)

## 2010-08-14 LAB — POCT CARDIAC MARKERS
CKMB, poc: 3.5 ng/mL (ref 1.0–8.0)
Myoglobin, poc: 304 ng/mL (ref 12–200)
Troponin i, poc: <0.05 ng/mL (ref 0.00–0.09)

## 2010-08-14 LAB — POCT I-STAT, CHEM 8
Chloride: 115 mEq/L — ABNORMAL HIGH (ref 96–112)
Creatinine, Ser: 3.6 mg/dL — ABNORMAL HIGH (ref 0.4–1.5)
Glucose, Bld: 129 mg/dL — ABNORMAL HIGH (ref 70–99)
HCT: 40 % (ref 39.0–52.0)
Potassium: 4.7 mEq/L (ref 3.5–5.1)
Sodium: 145 mEq/L (ref 135–145)

## 2010-08-14 LAB — DIFFERENTIAL
Basophils Relative: 0 % (ref 0–1)
Lymphs Abs: 0.8 10*3/uL (ref 0.7–4.0)
Monocytes Relative: 5 % (ref 3–12)
Neutro Abs: 4.8 10*3/uL (ref 1.7–7.7)
Neutrophils Relative %: 82 % — ABNORMAL HIGH (ref 43–77)

## 2010-08-14 LAB — URINE MICROSCOPIC-ADD ON

## 2010-08-14 LAB — TROPONIN I
Troponin I: 0.09 ng/mL — ABNORMAL HIGH (ref 0.00–0.06)
Troponin I: 0.14 ng/mL — ABNORMAL HIGH (ref 0.00–0.06)

## 2010-08-17 ENCOUNTER — Ambulatory Visit (HOSPITAL_COMMUNITY): Payer: Medicare Other | Attending: Nephrology

## 2010-08-17 DIAGNOSIS — D509 Iron deficiency anemia, unspecified: Secondary | ICD-10-CM | POA: Insufficient documentation

## 2010-09-05 LAB — CBC
MCHC: 32.2 g/dL (ref 30.0–36.0)
MCV: 88.5 fL (ref 78.0–100.0)
Platelets: 178 10*3/uL (ref 150–400)
RDW: 15.9 % — ABNORMAL HIGH (ref 11.5–15.5)

## 2010-09-05 LAB — BASIC METABOLIC PANEL
BUN: 34 mg/dL — ABNORMAL HIGH (ref 6–23)
CO2: 30 mEq/L (ref 19–32)
Calcium: 9.2 mg/dL (ref 8.4–10.5)
Chloride: 107 mEq/L (ref 96–112)
Creatinine, Ser: 1.95 mg/dL — ABNORMAL HIGH (ref 0.4–1.5)
Glucose, Bld: 78 mg/dL (ref 70–99)

## 2010-09-05 LAB — DIFFERENTIAL
Basophils Absolute: 0 10*3/uL (ref 0.0–0.1)
Basophils Relative: 0 % (ref 0–1)
Eosinophils Absolute: 0 10*3/uL (ref 0.0–0.7)
Monocytes Relative: 13 % — ABNORMAL HIGH (ref 3–12)
Neutrophils Relative %: 74 % (ref 43–77)

## 2010-09-05 LAB — URINALYSIS, ROUTINE W REFLEX MICROSCOPIC
Hgb urine dipstick: NEGATIVE
Nitrite: NEGATIVE
Protein, ur: NEGATIVE mg/dL
Urobilinogen, UA: 0.2 mg/dL (ref 0.0–1.0)

## 2010-09-10 LAB — GLUCOSE, CAPILLARY
Glucose-Capillary: 104 mg/dL — ABNORMAL HIGH (ref 70–99)
Glucose-Capillary: 108 mg/dL — ABNORMAL HIGH (ref 70–99)
Glucose-Capillary: 176 mg/dL — ABNORMAL HIGH (ref 70–99)
Glucose-Capillary: 191 mg/dL — ABNORMAL HIGH (ref 70–99)

## 2010-09-10 LAB — CBC
MCHC: 31.8 g/dL (ref 30.0–36.0)
MCHC: 33.1 g/dL (ref 30.0–36.0)
MCV: 90.4 fL (ref 78.0–100.0)
MCV: 91.5 fL (ref 78.0–100.0)
MCV: 92.6 fL (ref 78.0–100.0)
Platelets: 170 10*3/uL (ref 150–400)
Platelets: 180 10*3/uL (ref 150–400)
Platelets: 213 10*3/uL (ref 150–400)
RBC: 2.8 MIL/uL — ABNORMAL LOW (ref 4.22–5.81)
RDW: 18.5 % — ABNORMAL HIGH (ref 11.5–15.5)
RDW: 18.9 % — ABNORMAL HIGH (ref 11.5–15.5)
WBC: 2.7 10*3/uL — ABNORMAL LOW (ref 4.0–10.5)

## 2010-09-10 LAB — DIFFERENTIAL
Basophils Absolute: 0 10*3/uL (ref 0.0–0.1)
Eosinophils Absolute: 0 10*3/uL (ref 0.0–0.7)
Eosinophils Relative: 0 % (ref 0–5)
Lymphs Abs: 0.2 10*3/uL — ABNORMAL LOW (ref 0.7–4.0)
Monocytes Absolute: 0.6 10*3/uL (ref 0.1–1.0)
Neutrophils Relative %: 69 % (ref 43–77)

## 2010-09-10 LAB — CLOSTRIDIUM DIFFICILE EIA
C difficile Toxins A+B, EIA: NEGATIVE
C difficile Toxins A+B, EIA: NEGATIVE

## 2010-09-10 LAB — COMPREHENSIVE METABOLIC PANEL
ALT: 14 U/L (ref 0–53)
AST: 20 U/L (ref 0–37)
Albumin: 2.8 g/dL — ABNORMAL LOW (ref 3.5–5.2)
Alkaline Phosphatase: 77 U/L (ref 39–117)
BUN: 50 mg/dL — ABNORMAL HIGH (ref 6–23)
BUN: 69 mg/dL — ABNORMAL HIGH (ref 6–23)
Calcium: 8.1 mg/dL — ABNORMAL LOW (ref 8.4–10.5)
Chloride: 102 mEq/L (ref 96–112)
Creatinine, Ser: 2.01 mg/dL — ABNORMAL HIGH (ref 0.4–1.5)
GFR calc Af Amer: 25 mL/min — ABNORMAL LOW (ref >60)
GFR calc Af Amer: 41 mL/min — ABNORMAL LOW (ref >60)
GFR calc non Af Amer: 21 mL/min — ABNORMAL LOW (ref >60)
Glucose, Bld: 130 mg/dL — ABNORMAL HIGH (ref 70–99)
Total Bilirubin: 0.9 mg/dL (ref 0.3–1.2)
Total Protein: 6 g/dL (ref 6.0–8.3)

## 2010-09-10 LAB — URINALYSIS, ROUTINE W REFLEX MICROSCOPIC
Bilirubin Urine: NEGATIVE
Glucose, UA: NEGATIVE mg/dL
Hgb urine dipstick: NEGATIVE
Ketones, ur: NEGATIVE mg/dL
Protein, ur: NEGATIVE mg/dL
pH: 5.5 (ref 5.0–8.0)

## 2010-09-10 LAB — BASIC METABOLIC PANEL
BUN: 37 mg/dL — ABNORMAL HIGH (ref 6–23)
CO2: 19 mEq/L (ref 19–32)
Calcium: 7.8 mg/dL — ABNORMAL LOW (ref 8.4–10.5)
Chloride: 110 mEq/L (ref 96–112)
Creatinine, Ser: 1.68 mg/dL — ABNORMAL HIGH (ref 0.4–1.5)
GFR calc Af Amer: 50 mL/min — ABNORMAL LOW (ref >60)

## 2010-09-10 LAB — STOOL CULTURE

## 2010-09-10 LAB — LIPASE, BLOOD: Lipase: 42 U/L (ref 11–59)

## 2010-09-27 ENCOUNTER — Other Ambulatory Visit (HOSPITAL_COMMUNITY): Payer: Self-pay | Admitting: Urology

## 2010-09-27 ENCOUNTER — Encounter (HOSPITAL_COMMUNITY)
Admission: RE | Admit: 2010-09-27 | Discharge: 2010-09-27 | Disposition: A | Discharge status: Home / Self Care | Payer: Medicare Other | Source: Ambulatory Visit | Attending: Urology | Admitting: Urology

## 2010-09-27 DIAGNOSIS — A630 Anogenital (venereal) warts: Secondary | ICD-10-CM

## 2010-09-27 DIAGNOSIS — C632 Malignant neoplasm of scrotum: Secondary | ICD-10-CM

## 2010-09-27 MED ORDER — FLUDEOXYGLUCOSE F - 18 (FDG) INJECTION
16.7000 | Freq: Once | INTRAVENOUS | Status: AC | PRN
  Administered 2010-09-27: 16.7 via INTRAVENOUS

## 2010-10-09 NOTE — Procedures (Signed)
CEPHALIC VEIN MAPPING   INDICATION:  Preop for an AV fistula placement.   HISTORY:  Chronic kidney disease, stage 4.   EXAM:  The right cephalic vein is mostly compressible with a mildly  occlusive chronic thrombus noted at a valve leaflet of the mid/distal  brachium level.  Diameter measurements range from 0.14 to 0.56 cm.   The right basilic vein is compressible with diameter measurements  ranging from 0.23 to 0.61 cm.   See attached worksheet for all measurements.   IMPRESSION:  Patent right cephalic and basilic veins, as described above  and on the attached work sheet.   ___________________________________________  Janetta Hora. Fields, MD   CH/MEDQ  D:  02/15/2010  T:  02/15/2010  Job:  [id number]

## 2010-10-09 NOTE — H&P (Signed)
NAME:  Brandon Sharp, Brandon Sharp NO.:  [id number]   MEDICAL RECORD NO.:  [id number]          PATIENT TYPE:  INP   LOCATION:  1839                         FACILITY:  [facility]   PHYSICIAN:  Vania Rea, M.D. DATE OF BIRTH:  February 06, 1946   DATE OF ADMISSION:  06/01/2008  DATE OF DISCHARGE:                              HISTORY & PHYSICAL   PRIMARY CARE PHYSICIAN:  Unassigned.   PULMONOLOGIST:  Dr. Gretel Acre at [facility].   INFECTIOUS DISEASE PHYSICIAN:  Dr. Lynnette Caffey at [facility].   NEPHROLOGIST:  [facility] at [facility].   CARDIOLOGIST:  [facility].   CHIEF COMPLAINT:  Diarrhea and weakness for 1-1/2 weeks.   HISTORY OF PRESENT ILLNESS:  This is a 65 year old African American  gentleman who is status post cardiac transplant, status post renal  transplant.  Has a history of hypertension, diabetes, and for the past  few months has been treated for Aspergillosis with Vfend, who 1-1/2  weeks ago has developed persistent watery diarrhea and has become  progressively weak, dehydrated, and fatigued.  Eventually, because of  feeling so sick and lousy, he called his primary physicians, who advised  him to come to the nearest emergency room, and he presented to [facility] for assistance.   Patient denies any fever.  He does have a chronic cough since the summer  when he caught Aspergillosis but has been improving on Vfend.  He has  also been experiencing progressive weight loss and dyspnea on exertion  related to this diarrhea.   PAST MEDICAL HISTORY:  1. Coronary artery disease, status post multiple MIs, until coronary      transplant in February, 2001.  2. End-stage renal disease related to cyclosporin toxicity, status      post renal transplant in 2008.  3. Diabetes type 2.  4. Hyperlipidemia.  5. Coronary artery disease.  6. Chronic immunosuppression.  7. Recent diagnosis of  Aspergillosis since summer.  Has been on Vfend      since November.   MEDICATIONS:  1. Allopurinol 100 mg daily.  2. Aspirin 81 mg daily.  3. BiDil 20/37.5 1 tablet 3 times daily.  4. CellCept 500 mg twice daily.  5. Coreg 25 mg twice daily.  6. Famotidine 20 mg every 12 hours.  7. Lantus 25 units twice daily.  8. Lipitor 40 mg at bedtime.  9. Loratadine, new since November, 10 mg daily.  10.Metolazone 5 mg 1 tablet daily p.r.n. for weight 3 pounds above dry      weight.  11.Multivitamin 1 daily.  12.Protonix 40 mg daily.  13.Potassium chloride 20 mEq 2 tablets twice daily.  14.Prednisone 10 mg twice daily.  15.Prograf liquid 0.3 mg twice daily.  16.Torsemide 100 mg 1 tablet twice daily.  17.Valcyte 450 mg daily.  18.Vfend 200 mg twice daily.   ALLERGIES:  1. PENICILLIN.  2. LISINOPRIL.  3. NIACIN.   SOCIAL HISTORY:  A retired Hydrographic surveyor.  No alcohol or illicit drug use.  Quit tobacco use in 1989.  Married with 2 children.   FAMILY  HISTORY:  Significant for mother and brother with heart disease.  Brother with diabetes.   REVIEW OF SYSTEMS:  Other than noted above, a 10-point review of systems  unremarkable.   PHYSICAL EXAMINATION:  A middle-aged African American gentleman lying in  the stretcher, ill-looking, coughing frequently.  VITALS:  Temperature 97.2, pulse 80, blood pressure 134/73, saturating  at 99% on 2 liters.  Pain level is 2/10.  Pupils are round and equal.  Mucous membranes pink, anicteric.  He is  dehydrated.  No cervical lymphadenopathy or thyromegaly.  CHEST:  He has bronchial breathing in the upper chest bilaterally.  CARDIOVASCULAR:  Regular rhythm.  No murmur heard.  ABDOMEN:  Somewhat distended but soft, nontender.  Hyperactive bowel  sounds.  No masses felt.  EXTREMITIES:  He has wasting of the muscles.  Skin is dry and scaly.  He  has no edema.  Dorsalis pedis pulses 1+ bilaterally.  He has a coarse  tremor of the feet.  CENTRAL NERVOUS  SYSTEM:  Cranial nerves II-XII are grossly intact.  He  has no lateralizing signs.   LABS:  White count is 2.7, hemoglobin 11, platelets 213.  His urinalysis  is unremarkable.  His serum chemistry significant for a potassium of  3.4, glucose 130, chloride 102, CO2 20, BUN 69, creatinine 3.04.  He  describes his baseline as 2.7.  His calcium is 9.3.  Total protein 6,  albumin 3.6.  Liver functions are unremarkable.  His lipase is 42.   CT scan of the abdomen and pelvis without IV contrast shows colitis  involving a short segment of the ascending colon.  No evidence of  involvement of the remainder of the colon.  Thickening of the wall of  the distal esophagus and gastric cardia.  Query reflux disease.  Atrophic kidneys.  No acute abnormalities in the pelvis.  Sigmoid  diverticulosis without diverticulitis.  Unremarkable appearance of the  right pelvic renal transplant.  Mild-to-moderate median lobe prostate  enlargement.   ASSESSMENT:  1. Subacute diarrhea.  2. Dehydration.  3. Hypokalemia.  4. History of Aspergillosis infection, on chronic antifungal.  5. Chronic immunosuppressive state.  6. Hypertension, controlled.  7. History of diabetes.  8. Status post cardiac and renal transplant.   PLAN:  1. Will admit this gentleman for hydration.  2. Will consult gastroenterologist for assistance with management of      his colitis.  3. Will go ahead and culture, although we doubt any serious infectious      process.  4. Will consider consulting infectious disease and nephrology service.  5. Other plans as per orders.      Vania Rea, M.D.  Electronically Signed     LC/MEDQ  D:  06/02/2008  T:  06/02/2008  Job:  [id number]   cc:   Dr. Gretel Acre, [facility]  Dr. Lynnette Caffey, [facility]  [facility], [facility]

## 2010-10-09 NOTE — Assessment & Plan Note (Signed)
OFFICE VISIT   ANTWION, CARPENTER  DOB:  May 10, 1946                                       02/15/2010  ZOXWR#:[id number]   The patient is referred for left upper extremity swelling in the face of  a left upper arm AV graft.  He states that the left forearm started to  swell approximately 2-3 weeks ago.  He has previously had an angioplasty  of his innominate vein in May 2011.  He has also previously had left  subclavian vein stenting.  He also previously had a pacemaker or  defibrillator in his left subclavian vein in the past which has been  removed.  He currently is not on dialysis, but is nearing need for that.  He was on dialysis in the past and received a kidney transplant.  His  kidney is now failing.  Of note, he has had a heart transplant in the  past as well.  This is functioning well.   REVIEW OF SYSTEMS:  He denies any fever or chills.  He has no shortness  of breath or chest pain.   PHYSICAL EXAM:  Blood pressure is 171/81 in the right arm, heart rate 79  regular.  Temperature is 97.6.  In the left upper extremity there is  swelling extending from the dorsum of the hand all the way up to level  of the shoulder.  There are collateral veins across the left chest wall.  There is a well-healed left infraclavicular scar.  He has a 2+ radial  and brachial pulse in the left hand.  Right upper extremity has a 2+  brachial and radial pulse and a palpable cephalic vein at the  antecubital level.   He had a vein mapping ultrasound today which I reviewed and interpreted  which shows the cephalic vein in the upper arm is between 30 and 70 mm  in diameter.  There is one area of narrowing in the proximal third of  the vein probably from the previous IV or previous thrombus that is  recanalized.  However, this did not appear to be exclusive of placing a  fistula.  Of note, the basilic vein was also of reasonable caliber, but  slightly smaller.   I reviewed the  most recent central venogram from [facility]  dated Sep 30, 2009.  This shows a long stent which is patent throughout  the entire left subclavian vein and the area of angioplasty was at the  innominate SVC junction.   Also the left upper arm graft has been revised on two prior occasions.   I believe the best option at this point for the patient would be  ligation of the left upper arm AV graft as I believe any angioplasty  stenting procedures would be of limited durability with early recurrence  of swelling.  The graft basically is going to have a limited durability  due to his venous outflow problems.  I believe the best option at this  point would be to place a right brachiocephalic AV fistula that  hopefully will be ready if he requires dialysis in the near future and  ligating his left upper arm AV graft for symptomatic relief of swelling  from central vein stenosis and venous hypertension.  I discussed all  this with the patient today and procedure details, risks, benefits were  discussed with the patient.  He understands, agrees to proceed.  This is  scheduled for Wednesday, February 21, 2010.     Janetta Hora. Fields, MD  Electronically Signed   CEF/MEDQ  D:  02/15/2010  T:  02/16/2010  Job:  [id number]   cc:   Garnetta Buddy, M.D.

## 2010-10-09 NOTE — Discharge Summary (Signed)
NAME:  Brandon Sharp, Brandon Sharp              ACCOUNT NO.:  [id number]   MEDICAL RECORD NO.:  [id number]          PATIENT TYPE:  INP   LOCATION:  4153                         FACILITY:  [facility]   PHYSICIAN:  Beckey Rutter, MD  DATE OF BIRTH:  [birth year]   DATE OF ADMISSION:  06/01/2008  DATE OF DISCHARGE:  06/04/2008                               DISCHARGE SUMMARY   PRIMARY CARE PHYSICIAN:  Unassigned [facility].   CHIEF COMPLAINT:  Diarrhea and weakness for 1 day prior to presentation.   HOSPITAL COURSE:  During hospital, the patient was found to have  subacute area.  The CT scan was showing colitis involving a short  segment of ascending colon.  No evidence of involvement of the reminder  of the colon.  The patient then was evaluated by Gastroenterology  Service, which felt the patient is stable for discharge today.  The  patient has no diarrhea for the last 24 hours.  He had one consistent  bowel movement.  No intervention felt necessary at this time.  Nevertheless, the patient was encouraged to follow up with his primary  physician for further management.   DISCHARGE MEDICATIONS:  The patient will be discharged on:  1. Allopurinol 100 mg daily.  2. Aspirin 81 mg daily.  3. BiDil 20/37.5 mg tablets three times a day.  4. CellCept 500 mg twice a day.  5. Coreg 25 mg twice a day.  6. Pepcid 20 mg q.12 h.  7. Lantus 25 units twice a day.  8. Lipitor 40 mg at bedtime.  9. Loratadine 10 mg daily.  10.Multivitamin daily.  11.Potassium chloride 20 mEq 2 tablets daily.  12.Prednisone 10 mg twice a day.  13.Prograf liquid 0.3 mg twice daily.  14.Torsemide 100 mg 1 tablet twice a day.  15.Valcyte 450 mg daily.  16.VFEND 200 mg twice a day.   HOSPITAL PROCEDURES:  CT scan was showing impression of short segment of  the ascending colon colitis.   HOSPITAL CONSULTATION:  Gastroenterology Services, [place].   LABORATORY DATA:  Recent lab test is showing stool for C. diff is  negative.   Stool culture is pending negative as of now.  White blood  count is 1.7, hemoglobin is 8.5, hematocrit is 25.6, and platelet count  is 180.  Sodium 136, potassium 3.5, chloride is 110, bicarb 19, glucose  132, BUN is 37, and creatinine 1.68.   Please note the patient came with creatinine of 3.04.  The patient's  renal function improved with hydration and the patient was not seen by  Nephrology Service as it felt not necessary.   DISCHARGE DIAGNOSES:  1. Ascending colitis.  2. Diarrhea.  3. Dehydration.  4. Chronic kidney disease with elevation of creatinine felt secondary      to severe dehydration.  5. Coronary artery disease, status post multiple myocardial      infarctions, the patient is status post a transplant in February      2001.  6. 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  7. Diabetes, type 2.  8. Hyperlipidemia.  9. Chronic immunosuppression.  10.Recent diagnosis with aspergillosis since 2009 summer.  The patient      has been on VFEND since November 2009.   DISCHARGE PLAN:  The patient is stable for discharge today.  He was  advised to follow up with his pulmonologist, with his Infectious Disease  doctor, with his primary nephrologist, and with the cardiologist, which  he will all see in [place] [address], [place], metropolitan area.  The  patient is aware and agreeable to the discharge plan.      Beckey Rutter, MD  Electronically Signed     EME/MEDQ  D:  06/04/2008  T:  06/04/2008  Job:  [id number]

## 2010-10-09 NOTE — Assessment & Plan Note (Signed)
OFFICE VISIT   FLOYD, WADE  DOB:  [birth year]                                       03/08/2010  EAVWU#:[id number]   Patient returns for follow-up today.  He had ligation of his left upper  arm AV graft for venous hypertension.  He had placement of a right  brachiocephalic AV fistula with patch angioplasty of the right cephalic  vein on September 28.  He returns today for further follow-up.  He  states that the swelling in his left arm has improved slightly but still  remains.  He has now developed new-onset swelling in the right upper  extremity.  This is not very debilitating to him, and although the arm  is edematous, it is not as tight as the left arm was.   On physical exam today, blood pressure is 176/111 in the left calf.  Heart rate is 82 and regular.  Temperature is 97.8.  He has an audible  bruit and easily palpable thrill in the right upper arm AV fistula.  He  does have edema throughout both upper extremities.  Right is slightly  worse than left.  There is even edema in the dorsal aspect of the right  hand.   Patient currently has a patent AV fistula.  It is not mature at this  point.  However, he fortunately is not on dialysis yet.  We will see him  back in January 2012 for further evaluation of his fistula.  We will  perform a duplex ultrasound at that time if the fistula is not mature to  see if there is anything we can do to improve this over time.  If his  arm edema does not improve, we may need further evaluation of this to  make sure he does not have a central vein stenosis on the right side as  well.     Janetta Hora. Fields, MD  Electronically Signed   CEF/MEDQ  D:  03/08/2010  T:  03/08/2010  Job:  [id number]   cc:   Garnetta Buddy, M.D.

## 2010-10-11 ENCOUNTER — Ambulatory Visit: Payer: Medicare Other | Admitting: Vascular Surgery

## 2010-10-12 NOTE — H&P (Signed)
NAME:  Brandon Sharp, Brandon Sharp                          ACCOUNT NO.:  [id number]   MEDICAL RECORD NO.:  [id number]                   PATIENT TYPE:  INP   LOCATION:  5508                                 FACILITY:  [facility]   PHYSICIAN:  Leighton Roach McDiarmid, M.D.             DATE OF BIRTH:  December 03, 1945   DATE OF ADMISSION:  06/09/2003  DATE OF DISCHARGE:  06/14/2003                                HISTORY & PHYSICAL   CHIEF COMPLAINT:  Two days of malaise, belly pain, poor appetite and nausea.   HISTORY OF PRESENT ILLNESS:  A 65 year old African-American male with  chronic renal insufficiency, baseline creatinine of 3.5 to 4, presents with  2-3 days of worsening malaise, weakness, belly pain, poor appetite and  chills.  The belly pain is periumbilical and is characterized as a constant,  dull, boring pain, not exacerbated by eating.  Patient denies any diarrhea  or vomiting.  He denies any bright red blood per rectum, melena, and has no  history of ulcers, etc.  He denies any dysuria, hematuria, or urgency.  He  denies cough, chest pain, shortness of breath, or pleuritic pain.  Patient  states that his chronic renal failure/insufficiency is secondary to  cyclosporin.  Patient reports that he has had multiple episodes like this  over the past six months.   PAST MEDICAL HISTORY:  1. Hypertension.  2. Chronic renal insufficiency with baseline creatinine of 3.5 to 4.  3. Coronary artery disease, status post heart transplant in February, 2001.  4. History of MI in 1986, 1989, and 1999.  5. Diabetes mellitus 2.  6. Hyperlipidemia.  7. Was recently admitted on May 14, 2003 for hypertensive emergency and     seizures secondary to hypertension.   ALLERGIES:  1. PENICILLIN.  2. LISINOPRIL.   MEDICATIONS:  1. Norvasc 10 mg p.o. q.d.  2. Clonidine 0.4 mg p.o. b.i.d.  3. Cyclosporin 50 mg p.o. b.i.d.  4. Lasix 40 mg p.o. q.d.  5. Hydralazine 50 mg p.o. q.i.d.  6. Amaryl 8 mg p.o. q.d.  7. Niacin  5 mg p.o. q.d.  8. Labetalol 200 mg p.o. b.i.d.  9. Prednisone 3 mg p.o. q.d.  10.      Rapamune 1 mg p.o. q.d.  11.      Aspirin 81 mg p.o. q.d.  12.      Os-Cal 500 mg p.o. b.i.d.  13.      Pravachol 20 mg p.o. q.h.s.  14.      K-Dur 20 mEq p.o. q.d.   REVIEW OF SYSTEMS:  Noncontributory.  See HPI for pertinent positives.   FAMILY HISTORY:  Noncontributory.   SOCIAL HISTORY:  Patient is married and has two grown children who live in  [place].  Is a retired Hydrographic surveyor.  Denies any previous smoking,  tobacco, any alcohol use, or also any illicit drug use.   PHYSICAL EXAMINATION:  VITAL SIGNS:  On  admission, temperature 98.1, blood  pressure 160/100, respiratory rate 16-18, satting at 99%, pulse 104 to 115.  GENERAL:  No apparent distress.  Alert and oriented x 3.  HEENT:  Head is normocephalic and atraumatic.  Eyes:  Pupils are equal,  round and reactive to light.  Extraocular movements are intact.  Nose:  No  rhinorrhea or congestion.  Throat:  Moist mucous membranes.  No erythema in  the posterior oropharynx.  No lymphadenopathy.  No thyromegaly.  NECK:  LUNGS:  Clear to auscultation bilaterally.  No wheezes, rhonchi, or  crackles.  HEART:  Regular rate and rhythm.  Normal S1 and S2.  No murmurs, rubs or  thrills.  ABDOMEN:  Soft, minimally tender to palpation, superior to the umbilicus.  Nondistended.  Positive bowel sounds.  No rebound or guarding.  NEUROLOGIC:  Cranial nerves II-XII are grossly intact.  Muscle strength is  5/5, equal and symmetric bilaterally.  Reflexes are 2+ at the patella and  brachioradialis.  No asterixis.  Positive resting tremor.   LABS:  Sodium 130, potassium 4, chloride 101, bicarb 25.4, BUN 41,  creatinine 5, glucose 176.  White count 4.3, hemoglobin 15.9, hematocrit  47.1,  platelet count 327.  RDW is 17.3, MCV 80.  Venous blood gas:  PH  7.433, pCO2 38, bicarb 25.4.   ASSESSMENT/PLAN:  A 65 year old African-American male with nausea,  malaise,  belly pain, poor appetite, and fatigue.  Differential diagnosis includes  uremia, acute-on-chronic renal failure, insufficiency, peptic ulcer disease,  gastroenteritis, rule out acute causes of abdominal pain, such as the  pancreas and gallbladder or a viral upper respiratory infection.  Problem #1:  Abdominal pain:  We will obtain complete metabolic panel,  including lipase and abdominal series.  Also a urinalysis.  Problem #2:  Cardiovascular:  Hypertension.  Resume home meds.  Norvasc,  clonidine, and labetalol.  Heart transplant:  Continue Rapamune, cyclosporin, and prednisone.  Problem #3:  Pulmonary:  Chest x-ray to evaluate for fluid overload and  evaluation for infectious process.  Problem #4:  Gastrointestinal:  Renal diet as tolerated.  Protonix for  prophylaxis.  Follow up on Hemoccult stools.  Problem #5:  Renal:  Will check calcium, magnesium, and phosphorus in  addition to the CMET.  We will consult with [name] with [facility] regarding possible dialysis.  Problem #6:  Fluids, electrolytes, nutrition:  Begin normal saline at 75  cc/hr for general rehydration.  Sodium 130, hyponatremia secondary to  nephrosis.  Renal diet.  Problem #7:  Hematology:  No acute issues.  Problem #8:  Endocrine:  Diabetes mellitus 2.  Continue Amaryl with q.a.c.  and q.h.s. CBGs.  Cover with insulin sliding scale.      Silas Sacramento, M.D.                         Etta Grandchild, M.D.    Jearld Pies  D:  06/14/2003  T:  06/14/2003  Job:  [id number]

## 2010-10-12 NOTE — Op Note (Signed)
NAME:  Brandon Sharp, Brandon Sharp                          ACCOUNT NO.:  [id number]   MEDICAL RECORD NO.:  [id number]                   PATIENT TYPE:  INP   LOCATION:  5508                                 FACILITY:  [facility]   PHYSICIAN:  Quita Skye. Hart Rochester, M.D.               DATE OF BIRTH:  1945-08-20   DATE OF PROCEDURE:  06/10/2003  DATE OF DISCHARGE:                                 OPERATIVE REPORT   PREOPERATIVE DIAGNOSIS:  End-stage renal disease.   POSTOPERATIVE DIAGNOSIS:  End-stage renal disease.   OPERATION:  1. Insertion of a left forearm AV Gore-Tex graft (4 mm - 7 mm stretch),     brachial artery to brachial vein.  2. Bilateral ultrasound localization of internal jugular veins.  3. Insertion Diatek catheter, right internal jugular vein.   SURGEON:  Quita Skye. Hart Rochester, M.D.   FIRST ASSISTANT:  Coral Ceo, P.A.   ANESTHESIA:  Local.   DESCRIPTION OF PROCEDURE:  The patient was taken to the operating room and  placed in the supine position, at which time the left upper extremity was  prepped with Betadine scrub and solution, draped in routine sterile manner.  After infiltration of 1% Xylocaine, transverse incision was made in the  antecubital area, antecubital vein explored.  Both the cephalic and the  basilic branches were small and sclerotic and not suitable for grating.  The  brachial artery was exposed beneath the fascia, encircled with Vesi-loops,  had an excellent pulse.  There was an adjacent brachial vein which was 4 mm  in size which was the largest vein available.  A loop-shape tunnel was  created in the forearm after infiltrating with 1% Xylocaine and 3000 units  of heparin given intravenously.  Brachial artery was occluded proximally and  distally with Vesi-loops.  Brachial artery was opened with the 15 blade,  extended with the 5 scissors, 4 mm end of the graft was anastomosed end-to-  side with 6-0 Prolene.  The 7 mm end of the graft was then anastomosed to  the  brachial vein with 6-0 Prolene.  Clamp is then released, and there was  an excellent pulse and thrill in the graft.  No protamine was given.  The  wound was irrigated with saline, closed in layers with Vicryl in a  subcuticular fashion with Steri-Strips and sterile dressing applied and  attention was turned to the upper chest and neck.  Both internal jugular  veins were then imaged using the B-mode ultrasound and both were noted to be  patent.  After prepping the upper chest and neck in a routine fashion, the  right internal jugular vein was entered using a supraclavicular approach,  guidewire passed into the right atrium.  The tract was appropriately  dilated, and a 24 cm Diatek catheter passed through a peel-away-sheath,  positioned in the right atrium.  The catheter was then tunneled peripherally  and secured with a nylon  suture and the wound closed with Vicryl in a  subcuticular fashion.  Sterile dressing applied.  The patient taken to the  recovery room in satisfactory condition.                                               Quita Skye Hart Rochester, M.D.    JDL/MEDQ  D:  06/10/2003  T:  06/11/2003  Job:  [id number]

## 2010-10-12 NOTE — Consult Note (Signed)
NAME:  Brandon Sharp, Brandon Sharp NO.:  [id number]   MEDICAL RECORD NO.:  [id number]                   PATIENT TYPE:  EMS   LOCATION:  MAJO                                 FACILITY:  [facility]   PHYSICIAN:  Abigail Miyamoto, M.D.              DATE OF BIRTH:  12/19/1945   DATE OF CONSULTATION:  02/04/2003  DATE OF DISCHARGE:  02/04/2003                                   CONSULTATION   CHIEF COMPLAINT:  Generalized malaise.   HISTORY OF PRESENT ILLNESS:  This is a 65 year old gentleman who is status  post a heart transplant approximately three years ago at [facility] who reports  that he did develop some mild periumbilical abdominal pain and generalized  malaise on Monday of this week.  He had some episode of diarrhea but since  then has had normal bowel movements.  He has had no nausea with this and has  had decreased appetite but has been able to eat.  He has had no emesis.  He  reports he did have some pain in his periumbilical area but none into the  right lower quadrant.  He is currently without pain right now.  He reports  today he is complaining with how fatigue he was very quickly.  He had no  fevers or chills.  He had no chest pain or shortness of breath.  He was last  seen at [facility] in March regarding his heart transplant and he has been doing  well according to him, but he has had no jaundice.  He has had no dysuria.   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  He has no previous  other surgical history.   MEDICATIONS:  Cyclosporin, Norvasc, Amaryl, prednisone, Os-Cal, and  vitamins.  He does have some chronic renal failure secondary to the use of  medications.  He also takes aspirin.   ALLERGIES:  PENICILLIN.   SOCIAL HISTORY:  He does not smoke, does not drink alcohol.   PHYSICAL EXAMINATION:  GENERAL:  Well-developed, well-nourished gentleman in  no acute distress.  VITAL  SIGNS:  Blood pressure 113/73, temperature 99.2, pulse 125,  respirations 20.  EYES:  He is anicteric.  Pupils are reactive bilaterally.  EARS, NOSE, MOUTH AND THROAT:  External ears and nose are normal.  Hearing  is normal.  Oropharynx is clear.  NECK:  Supple.  There is no cervical adenopathy.  There is no thyromegaly.  LUNGS:  Normal effort, clear to auscultation bilaterally.  CARDIAC:  Mildly tachycardic with regular rhythm.  ABDOMEN:  Soft.  There is a well-healed upper midline incision.  There is no  tenderness in any of the parts of the abdomen.  There is no guarding.  There  is a questionable small hernia in the lower part of the incision.  EXTREMITIES:  Well perfused.   LABORATORY DATA:  The patient has a normal white blood cell count at 10,  hemoglobin 16.5, platelets 454.  He has 72% neutrophils.  Amylase and lipase  are normal.  Urinalysis negative.  CT scan of the abdomen and pelvis was  performed with oral contrast and shows a small defect at his umbilicus  containing __________ with a small incisional hernia.  There is no bowel  contained in this.  There is a normal-appearing appendix.  However, there is  some question of some stranding in the right lower quadrant.   ASSESSMENT AND PLAN:  This is a patient with generalized malaise of  uncertain etiology.  Given his abdominal exam, the duration of his illness  and the CT scan findings, I seriously doubt appendicitis.  I do not suspect  that his chronic medications and steroids are suppressing his physical  findings.  I suspect he may have some form of gastroenteritis.  I also doubt  any chronic rejection.   At this point we discussed options including expected management with  hospital admission and IV fluids versus discharge home on oral antibiotics  and vigorous rehydration.  At this point he does not desire admission to the  hospital.  I am going to send him home on Cipro 500 mg p.o. b.i.d.  If he  feels any worsening  of his symptoms, he is to return to the emergency  department or he will call our office.  We may need to consider repeat CT  scan of may need to consult physicians at [facility] should his condition change.                                                Abigail Miyamoto, M.D.    DB/MEDQ  D:  02/04/2003  T:  02/06/2003  Job:  [id number]

## 2010-10-12 NOTE — Op Note (Signed)
NAME:  Brandon Sharp, Brandon Sharp              ACCOUNT NO.:  [id number]   MEDICAL RECORD NO.:  [id number]          PATIENT TYPE:  AMB   LOCATION:  SDS                          FACILITY:  [facility]   PHYSICIAN:  Janetta Hora. Fields, MD  DATE OF BIRTH:  13-Jul-1945   DATE OF PROCEDURE:  09/17/2005  DATE OF DISCHARGE:                                 OPERATIVE REPORT   PROCEDURE:  Thrombectomy and revision of left upper arm arteriovenous graft.   PREOPERATIVE DIAGNOSIS:  Thrombus, left upper arm arteriovenous graft.   POSTOPERATIVE DIAGNOSES:  Thrombus, left upper arm arteriovenous graft.   SURGEON:  Janetta Hora. Fields, MD   ASSISTANT:  Nurse.   ANESTHESIA:  Local with IV sedation.   OPERATIVE FINDINGS:  1.  Intimal thickening and hypertrophied valve of axillary vein.  2.  Conversion of end-to-side to end-to-end with 6-mm interposition graft.   OPERATIVE DETAILS:  After obtaining informed consent, the patient was taken  to the operating room.  The patient was placed in a supine position on the  operating table.  After adequate sedation, the patient's entire left upper  extremity was prepped and draped in the usual sterile fashion.  Local  anesthesia was infiltrated at a preexisting longitudinal scar up near the  axilla.  Incision was reopened and carried down through the subcutaneous  tissues down to the level of the graft.  Dissection was carried down to the  level of the graft to the venous anastomosis; this had been previously  placed end-to-side.  Dissection proceeded around the proximal portion of the  vein and this was dissected free circumferentially and ligated with 2-0 silk  tie.  The vein was dissected free above the level of the anastomosis and the  venous anastomosis was taken down.  There was vein thickening and  hypertrophy of a venous valve at the area of the anastomosis.  This was all  debrided away.  A #4 Fogarty catheter was passed up the more proximal vein  and all thrombotic  material was removed.  There were several areas of  thickening throughout the vein on withdrawing the Fogarty catheter.  There  was venous backbleeding, however.   The vein was spatulated.  Next, a 6-mm interposition graft was brought up on  the operative field.  The patient had been previously given 5000 units of  intravenous heparin.  The graft was beveled and sewn end-to-end to the vein  using a running 6-0 Prolene suture.  After completion of the anastomosis,  this was thoroughly flushed with heparinized saline and then clamped on the  level of the graft.  Next, a #4 Fogarty catheter was passed down the  arterial limb of the graft.  All thrombotic material was removed and there  was good arterial inflow.  Graft was then clamped proximally.  The new  interposition graft was cut to length and then sewn end-to-end to the old  graft using a 6-0 Prolene suture.  just prior to completion of the  anastomosis, this was fore-bled, back-bled and thoroughly flushed.  The  anastomosis was secured, clamps were released and there was  a palpable  thrill in the graft immediately.  The patient was given 30 mg of protamine.  Hemostasis was obtained with thrombin and Gelfoam.  Subcutaneous tissues  were reapproximated using a running 3-0 Vicryl suture.  Skin was closed with  a 4-0 Vicryl  subcuticular stitch.  The patient had a palpable radial pulse at the end of  the case.  The patient tolerated the procedure well and there were no  immediate complications.  Instrument, sponge and needle count was correct at  the end of the case.  The patient was taken to the recovery room in stable  condition.           ______________________________  Janetta Hora Fields, MD     CEF/MEDQ  D:  09/17/2005  T:  09/18/2005  Job:  [id number]

## 2010-10-12 NOTE — Consult Note (Signed)
Brandon Sharp, Brandon Sharp                          ACCOUNT NO.:  [id number]   MEDICAL RECORD NO.:  [id number]                   PATIENT TYPE:  INP   LOCATION:  2113                                 FACILITY:  [facility]   PHYSICIAN:  Maree Krabbe, M.D.             DATE OF BIRTH:  Feb 03, 1946   DATE OF CONSULTATION:  05/14/2003  DATE OF DISCHARGE:                                   CONSULTATION   REASON FOR ADMISSION:  Renal failure and metabolic acidosis.   HISTORY OF PRESENT ILLNESS:  This is a 65 year old African-American male  with a history of orthotopic heart transplant in 2001, also known chronic  renal failure with history of cyclosporin nephrotoxicity as well as long  standing hypertension, presenting with a 2-day illness characterized by  chills, nausea, vomiting, some abdominal pain off and on, and some diarrhea.  He denies any bloody stools, cough, shortness of breath, or measured fever  at home.  He has had a significant renal history including a baseline  creatinine followed at [facility] of around 3 to 3.5.  He has had at least two admissions at [facility] for acute renal  failure and the most recent one we have details of was in October of this  year (two months ago) where he was admitted with a creatinine of 5.2 and an  elevated cyclosporin level treated in reduction of cyclosporin dose and  intravenous fluids.  At discharge was 3.6 which was considered his baseline  at that time.  He was then referred to Dr. Elvis Coil of [facility] who did an MRA of his native renal arteries which was negative  for renal artery stenosis, and was treating him with Lasix for his nephrotic  syndrome and lower extremity edema.  The patient has been considered to have  progressive renal failure; his creatinine last week was up to the mid  4 range and now he presents with a creatinine of 5.7 as noted below.   PAST MEDICAL HISTORY:  1. 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  2. Chronic renal failure as below.  3. Hypertension.  4. Diabetes mellitus type 2.  5. Hyperlipidemia.  6. History of a seizure in the past.   MEDICATIONS:  1. Rapamune 1 mg daily.  2. Prednisone 3 mg daily.  3. Norvasc 10 daily.  4. Clonidine 0.2 b.i.d.  5. Pravachol 20 daily.  6. Aspirin 81 mg daily.  7. Amaryl 8 mg daily.  8. Cyclosporin 50 b.i.d.  9. Protonix 40 daily.   ALLERGIES:  PENICILLIN.   SOCIAL HISTORY:  The patient is married and is accompanied by his wife.  No  tobacco or alcohol use.  No drug use.   REVIEW OF SYSTEMS:  GENERAL:  Denies fever or weight loss.  He has been  having chills.  HEENT:  Denies hearing loss or change, no sore throat or  difficulty  swallowing.  He has had some mild headaches.  CARDIORESPIRATORY:  Denies anterior chest pain, orthopnea, paroxysmal nocturnal dyspnea, dyspnea  on exertion recently.  GASTROINTESTINAL:  As above.  GENITOURINARY:  Denies  dysuria, hematuria, foul odor to urine or change in voiding habits recently.  MUSCULOSKELETAL:  Denies arthralgia, myalgia, severe joint pain.  NEUROLOGIC:  Denies focal numbness or weakness, no history of stroke,  transient ischemic attack.  He does apparently have a history of a seizure  in the past.  PSYCHE:  Denies any hallucinations, symptoms of depression or anxiety.   LABORATORY DATA:  White blood count is 13,000, hemoglobin 16, platelets  139,000.  Sodium 137, potassium 4.0, CO2 is 11, chloride 102, anion gap is  24.  BUN 60, creatinine 5.7, AST 33, ALT less than 8, alkaline phosphatase  54, total bilirubin 0.6, CPK 6, albumin 2.5, calcium 9.3.  Portable chest x-  ray which is rotated shows an obscured right heart border, possible  infiltrate.  ABG is 7.24/22/76.  Urinalysis:  greater than 300 protein,  leukocyte esterase negative, nitrite negative, 3-6 rbc's, no white blood  cells.   IMPRESSION:  1. 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  Primary problem of late is     probably cyclosporin effect.  2. Severe metabolic acidosis with chills and elevated lactic acid levels     suggestive of sepsis of no clear source.  He may have a bacterial     pneumonia; urine does not look grossly infected on the urinalysis, the     abdomen is not impressive on exam and appears benign.  3. Orthotopic heart transplant with cyclosporin, Rapamune and prednisone as     immunosuppressants.  4. Diabetes mellitus type 2.  5. History of nephrotic syndrome in the recent past.  6. Uncontrolled hypertension.   RECOMMENDATIONS:  1. Intravenous sodium bicarbonate drip and follow urine output.  2. Treat presumed infection as you are doing.  3. He will likely need dialysis soon, will discuss with family and patient.                                               Maree Krabbe, M.D.    RDS/MEDQ  D:  05/14/2003  T:  05/16/2003  Job:  [id number]

## 2010-10-12 NOTE — Discharge Summary (Signed)
NAME:  Brandon Sharp, Brandon Sharp                          ACCOUNT NO.:  [id number]   MEDICAL RECORD NO.:  [id number]                   PATIENT TYPE:  INP   LOCATION:  5508                                 FACILITY:  [facility]   PHYSICIAN:  Rebecca L. Jolinda Croak, M.D.          DATE OF BIRTH:  04/23/1946   DATE OF ADMISSION:  06/09/2003  DATE OF DISCHARGE:  06/14/2003                                 DISCHARGE SUMMARY   FINAL DIAGNOSES:  1. Acute renal failure on chronic renal insufficiency.  2. Hypertension.  3. Diabetes mellitus type 2.  4. Coronary artery disease status post heart transplant in 2001.  5. Hyperlipidemia.  6. Anemia secondary to iron deficiency and chronic disease.   PHYSICIANS:  1. Primary care physician is [name] with [facility].  2. Consulting physician, [name] with [facility].  3. Consulting physician #2, Dr. Josephina Gip with [facility].   PROCEDURE:  1. Chest x-ray June 09, 2003 showed no air space disease.  2. Two-view of the abdomen June 09, 2003 showed normal bowel gas pattern.  3. Right internal jugular vein Port-A-Cath placement June 10, 2003.  4. Left forearm AV Gortex graft placement June 10, 2003.  5. Chest x-ray June 10, 2003 showed right central venous catheter     placement without pneumothorax.   LABORATORY DATA:  Admission labs:  Sodium 130, potassium 4, chloride 101,  bicarb 25.4, BUN 41, creatinine 5, glucose 176. White count 4.3, hemoglobin  15.9, hematocrit 47.1, platelets 227, RDW 17.3 MCV 80. Venous blood gas of  7.43, pCO2 38, bicarb 25.4.   prior to discharge labs:  Sodium 134, potassium 3.8, chloride 97, bicarb 28,  BUN 38, creatinine 4.1, glucose 56. White count 5.4, hemoglobin 10.3,  hematocrit 30.5, platelet count 133. Calcium 8.4. Phosphorous 5.8.   Iron studies during the hospital stay:  Iron 29 low, total iron binding  capacity 217, percent saturation 13, ferritin 220.   Parathyroid hormone is 250.9.  Hemoglobin A1c is 6.0.   HISTORY AND PHYSICAL:  Please see dictated H&P for more information. In  short, the patient is a 65 year old African-American male with chronic renal  insufficiency secondary to cyclosporin toxicity. Presented with two to three  days of worsening malaise, weakness, belly pain, decreased appetite, and  chills consistent with uremia picture. Admission BUN was 41 and creatinine  12.0, above his baseline of creatinine of 3.5.   HOSPITAL COURSE:  1. Acute renal failure in the face of chronic renal insufficiency. Hospital     day #2, the patient received a right internal jugular vein Port-A-Cath     and a left forearm AV graft. The patient began hemodialysis treatments in     house, completing his fourth day by the time of discharge.     Symptomatically, the patient improved throughout stay. BUN and creatinine     on discharge 38/4.1. Followup with [name] at the [facility] with hemodialysis planned for Tuesdays, Thursdays, and Saturdays.  2. Hypertension. The patient's blood pressure gradually improved throughout     hospital course secondary to hemodialysis treatment. He was gradually     titrated off his home medications, having decreased hydralazine to 25 mg     q.i.d. and discontinuing the clonidine entirely. Current medications on     discharge include Norvasc, hydralazine, and labetalol.  3. Diabetes mellitus type 2. Resume home medications, Amaryl 8 mg p.o. q.d.     The patient's CBGs were monitored q.a.c. and h.s. and covered with     sliding scale insulin throughout. Hemoglobin A1c obtained during     admission was 6.0 indicating the patient is under good control.  4. Anemia. Iron studies indicated iron-deficiency anemia in conjunction with     anemia of chronic disease. Begin Venofer 100 mg IV in his hemodialysate     on Tuesdays, Thursdays, and Saturdays.  5. Secondary hyperparathyroidism. A PTH obtained during the hospital stay     was  250.9, felt to be secondary to his chronic renal insufficiency. The     patient was started on Zemplar 4 mEq IV in his hemodialysate on Tuesdays,     Thursdays, and Saturdays for his treatment of hyperparathyroidism. Will     follow up with [name].  6. Coronary artery disease status post heart transplant 2001. Continue his     home immunosuppressant regimen including Rapamune, prednisone, and     cyclosporin. The patient was erroneously begun on prednisone 30 mg p.o.     q.d. His home dose is 3 mg p.o. q.d. The patient was discharged home on a     prednisone taper of 20 mg p.o. q.d. for 2 days, 10 mg p.o. q.d. for 3     days, 5 mg p.o. q.d. for 3 days, then resume his normal medication dose     of 3 mg p.o. q.d.   FOLLOW UP:  The patient was instructed to follow up with [facility] on Tuesdays, Thursdays, and Saturdays at 6:45 a.m. for hemodialysis  with [name].   DISCHARGE INSTRUCTIONS:  Resume normal activity as tolerated. Consume a  renal diet with low salt and fluid restriction.   WOUND CARE:  Keep surgical sites clean and dry for 48 hours, then wash with  water and pat dry. No submerging in water for up to two weeks.   DISCHARGE MEDICATIONS:  1. Norvasc 10 mg one p.o. q.d.  2. Aspirin 81 mg one p.o. q.d.  3. Calcium carbonate 500 mg one p.o. q.i.d.  4. Cyclosporin 50 mg one p.o. b.i.d.  5. Amaryl 8 mg one p.o. q.d.  6. Hydralazine 25 mg one p.o. q.i.d.  7. Labetalol 200 mg one p.o. b.i.d.  8. Protonix 40 mg one p.o. q.d.  9. Pravachol 20 mg one p.o. q.d.  10.      Nephro-Vite one p.o. q.d.  11.      Rapamune 1 mg p.o. q.d.  12.      Prednisone taper 20 mg q.d. for 2 days, 10 mg p.o. q.d. for 3 days,     5 mg p.o. q.d. for 3 days, then resume his normal prednisone dose of 3 mg     p.o. q.d. indefinitely.     Silas Sacramento, M.D.                         Randon Goldsmith. Jolinda Croak, M.D.  JG/MEDQ  D:  06/14/2003  T:  06/15/2003  Job:  [id number]

## 2010-10-12 NOTE — Consult Note (Signed)
NAMEROBEN, SCHLIEP                          ACCOUNT NO.:  [id number]   MEDICAL RECORD NO.:  [id number]                   PATIENT TYPE:  INP   LOCATION:  2113                                 FACILITY:  [facility]   PHYSICIAN:  Pramod P. Pearlean Brownie, MD                 DATE OF BIRTH:  [birth year]   DATE OF CONSULTATION:  DATE OF DISCHARGE:                                   CONSULTATION   REFERRING PHYSICIAN:  Dr. __________.   CLINICAL HISTORY, REASON FOR REFERRAL:  Seizure.   HISTORY OF PRESENT ILLNESS:  The patient is a young African-American  gentleman who was brought to the emergency room by his wife for evaluation  of possible seizure.  The patient is unable to provide history, which is  provided by his wife who is present at the bedside.  The patient apparently  was not feeling well yesterday and complained of some abdominal pain and  nausea.  He was seen in the emergency room here and thought to have a  urinary tract infection and discharged with a prescription of ciprofloxacin.  However, the patient did not take this.  This morning, they found him to be  confused and disoriented, stating that his right hand did not move well.  The wife was concerned and called EMS.  The EMS found him in the bed,  confused, with right-sided gaze preference and right-sided paralysis.  He  subsequently had a generalized tonic-clonic seizure.  His blood glucose was  found to be 230 mg percent.  His vital signs at that time revealed a heart  rate of 130 and blood pressure of 230/110.  The patient had an airway  inserted and was subsequently found to be combative and confused.  He has  subsequently calmed down since he has come to the emergency room.  His vital  signs have remained stable, and blood pressure has come down without  specific treatment.  He has no prior history of documented seizure activity  as per his wife.  He has no _________complaints or neurological illness.   PAST MEDICAL HISTORY:  1.  Congestive heart failure.  He has had cardiac transplantation and is     followed at [facility] for the same.  2. He has renal failure related to cyclosporine.  Cyclosporine dose has been     gradually reduced.  He sees [name], nephrologist, in [place].   CURRENT MEDICATIONS:  Rapamune.  Prednisone.  Norvasc.  Pravachol.  Amaryl.  Niaspan.  Clonidine.  Potassium.  Aspirin.   MEDICATION ALLERGIES:  PENICILLIN and LISINOPRIL.   SOCIAL HISTORY:  The patient is married, living with his wife in  [place].  He does not smoke or drink.   REVIEW OF SYSTEMS:  Significant for possible UTI, as stated earlier.  No  other chest pain, shortness of breath, cough, or diarrhea.   PHYSICAL EXAM:  GENERAL:  Young African-American gentleman who  is restless  in bed.  VITAL SIGNS:  He is febrile, with pulse rate of 110 per minute, blood  pressure now 150/65, respiratory rate of 20 per minute.  HEENT:  Head is nontraumatic.  Pupils are 4 mm, equal, reactive.  He had  some right gaze preference, but he is able to voluntarily look to the left  and has full range of horizontal eye movements.  He has subtle facial  asymmetry when he smiles, with some right nasolabial fold weakness.  Tongue  is midline.  Palate moves normally.  NECK:  Supple.  Without any stiffness.  NEUROLOGIC:  The patient is lethargic.  He can be aroused.  Follows commands  but goes back to sleep right away.  He moves all four extremities  spontaneously though he tends to move the right upper extremity less.  He  has some weakness of the right upper extremity, grade 3/5.  He is able to  hold up his right upper extremity against gravity, but it takes a lot of  prompting and effort on his part to do so.  Moves lower extremities both  symmetrically.  Right plantar is upgoing, left is downgoing.  Deep tendon  reflexes are 2+, symmetric.  _________.  He withdraws to noxious stimuli  with all extremities.  Gait and coordination were not  tested.   Laboratory data reviewed.  CT scan of the head noncontrast done today shows  bilateral generalized cerebral atrophy.  There is mild frontal ________  density collection in asymmetric fashion, most likely due to atrophy.  No  active subdural hematoma or infarction is noted.   Admission laboratories are pending.   IMPRESSION:  Young male with likely two episodes of partial-onset seizures  with ________with postictal confusion.  Etiology of the seizure perhaps  cyclosporine toxicity versus metabolic derangement from his renal failure.  However, given his immunocompromised state, structural ________ brain  lesions need to be ruled out.   PLAN:  I would recommend treating him symptomatically for his seizures with  IV Depacon 2 g loading dose now followed by 500 mg q.6h.  Check Depacon  level in the morning, and aim for a level of 80-100 mg percent.  If he has  any breakthrough seizures, check an immediate level, and may need additional  bolus doses to aim for a higher level.  I would also recommend MRI scan of  the brain with contrast to rule out any structural brain lesions.  Check  cyclosporine levels as well as UA and blood chemistries.  EEG also to look  for encephalopathy or _______ seizures.  I had a long discussion with the  patient's wife in regards to planed evaluation and treatment and answered  questions.  Kindly call for questions.                                               Pramod P. Pearlean Brownie, MD    PPS/MEDQ  D:  05/14/2003  T:  05/15/2003  Job:  [id number]

## 2010-10-12 NOTE — H&P (Signed)
NAME:  DEMARQUS, JOCSON                          ACCOUNT NO.:  [id number]   MEDICAL RECORD NO.:  [id number]                   PATIENT TYPE:  INP   LOCATION:  5524                                 FACILITY:  [facility]   PHYSICIAN:  Maree Krabbe, M.D.             DATE OF BIRTH:  1946-02-01   DATE OF ADMISSION:  07/22/2003  DATE OF DISCHARGE:                                HISTORY & PHYSICAL   CHIEF COMPLAINT:  Shortness of breath.   HISTORY OF PRESENT ILLNESS:  A 65 year old black male with past medical  history significant for hypertension, end-stage renal disease, coronary  artery disease, status post transplant in February of 2001, who comes to the  ER with increasing shortness of breath for about two to three days.  The  patient has been for regular dialysis, last one being in Thursday.  Also  complains of a cough with sputum production, not any particular color for  the past two to three days.  Also had one episode of vomiting in the ER  prior to admission.  No diarrhea, no abdominal pain, no sick contacts.   ALLERGIES:  PENICILLIN produces a rash.  LISINOPRIL causes swelling.   PAST MEDICAL HISTORY:  1. Hypertension.  2. Renal insufficiency secondary to diabetes.  3. History of acute on chronic renal failure secondary to cyclosporin     toxicity.  4. Coronary artery disease, status post heart transplant in February of 2001     at [facility] with grade II rejection.  5. Myocardial infarction in 1986, 1989, and 1999.  6. Diabetes type 2.  7. Hyperlipidemia.  8. History of seizures secondary to hypertension.  9. Anemia of chronic disease.  10.      Access; has a left forearm AV graft.  11.      History of ventricular tachycardia with automatic implantable     cardioverter-defibrillator placement in 1996 and then removal in 2001.  12.      Status post CABG in 1990.  13.      History of colon polyps.  14.      MRA of kidney which __________ of renal artery stenosis.   MEDICATIONS:  1. Norvasc 10 mg one p.o. daily q.h.s.  2. Labetalol 200 mg one p.o. q.h.s.  3. Phenergan q.6h p.r.n.  4. Prednisone 3 mg one p.o. daily.  5. Rapamune 1 mg p.o. daily.  6. Cyclosporin 50 mg one p.o. b.i.d.  7. Aspirin 81 mg one p.o. daily.  8. Nephro-Vite one p.o. daily.  9. Pravachol 20 mg one p.o. daily.  10.      Protonix 40 mg one p.o. daily.  11.      Amaryl 8 mg one p.o. daily.  12.      Calcium carbonate 500 mg one p.o. q.i.d.  13.      Epogen 4500 units q. hemodialysis.  14.      InFeD 50 mg IV  q. Thursday with dialysis.  15.      Calcitrol 1 mcg q. hemodialysis IV.   SOCIAL HISTORY:  He is married and has two children.  He is a retired  Hydrographic surveyor.  He does not drink alcohol or do any illicit drugs.  History of  tobacco use, quit in 1989.   FAMILY HISTORY:  Mother and brother had heart disease, brother with  diabetes.   REVIEW OF SYSTEMS:  No diarrhea, no loss of consciousness, no palpitations.  No left arm radiation of pain.   PHYSICAL EXAMINATION:  VITAL SIGNS:  Pulse 115 per minute, blood pressure  125/111, temperature 97, respiratory rate 20 per minute, O2 saturation 91%  on 2 liters.  CBG's 150, 139.  Weight 70.9 dropped to 68.1 after dialysis.  HEENT:  Normocephalic and atraumatic.  Pupils equal, round, and reactive to  light and accommodation.  Extraocular movements intact. Nose; normal nasal  turbinates.  Throat with no exudate.  NECK:  No JVD.  HEART:  Regular rhythm, no murmurs, rubs, or gallops.  Tachycardic.  LUNGS:  Clear to auscultation bilaterally.  Basal rhonchi present in the  right side.  ABDOMEN:  Soft.  Bowel sounds are heard.  EXTREMITIES:  No edema, cyanosis, or clubbing.  NEUROLOGY:  Intact.   LABORATORY DATA:  Sodium 133, potassium 4.8, chloride 91, bicarb 27, BUN 24,  creatinine 6.5, serum glucose of 130. Total bilirubin 0.9, alkaline  phosphatase 57, AST 21, ALT 11, total protein 6.6, albumin 2.1.  BNP greater  than 3200.  ABG's;  7.56/31/54/28/98%. White count 9.2, hemoglobin 13.9,  hematocrit 41.9, platelets 297.  CK 62, MB 3.0, troponin I 0.04.  Second set  28, 1.0, and 0.06.   Chest x-ray showed vascular congestion with questionable early consolidation  of right lower lobe.   ASSESSMENT:  1. Shortness of breath with chest x-ray showing mild congestive heart     failure and a questionable pneumonia.  Will go ahead and dialyze now and     start on p.o. Avelox 400 to cover wide spectrum as the patient is     immunosuppressed due to use of prednisone, cyclosporin, and Rapamune.     Will check sputum cultures.  If he spikes a temperature, we will also     check blood cultures.  2. End-stage renal disease.  Hemodialysis on Tuesday/Thursday/Saturday, will     dialyze now.  3. Hypertension.  Will put back on home blood pressure medications and     hemodialysis will probably decrease pressures further.  4. Diabetes.  Will continue on Amaryl 8 mg p.o. daily.  Check CBG's q.a.c.     and q.h.s. and use sliding scale insulin if needed.  5. Coronary artery disease.  Will check CK-MB, troponin I q.8h x3 and have     an EKG in the morning.  6. Hyperlipidemia.  Will put back on Pravachol.  7. Anemia.  Will put back on EPO and iron.     Adonis Housekeeper, M.D.                         Maree Krabbe, M.D.   TS/MEDQ  D:  07/23/2003  T:  07/23/2003  Job:  [id number]

## 2010-10-12 NOTE — Procedures (Signed)
ELECTROENCEPHALOGRAM:  #[id number].   PROCEDURE:  Electroencephalogram.   OPERATOR:  Catherine A. Orlin Hilding, M.D.   INDICATIONS FOR PROCEDURE:  [name] with a history of  hypertensive encephalopathy and Tourette's syndrome.   DESCRIPTION OF PROCEDURE:  This is a 17-channel electroencephalogram with  one channel devoted to electrocardiogram, utilizing the International 10-20  lead placement system.  The patient is described as clinically being awake  and asleep with some snoring noted.  Electrographically there is not much  detention.  The background consists of an admixture of theta, delta and  alpha activity with theta predominating.  Occasional alpha range rhythms are  seen, but they are not well-sustained.  No interhemispheric asymmetry is  identified, and no epileptiform discharges are seen.  This is a portable  study.  No activation procedures were performed.  The electrocardiogram monitor reveals a relatively regular rhythm with a  heart rate of 90 beats per minute.   CONCLUSION:  Abnormal electroencephalogram, demonstrating mild to moderate  generalized slowing without seizure activity or focal abnormalities seen  during the course of today's recording.  This is a nonspecific finding.  Would be compatible with a moderate degree of diffuse neuronal dysfunction,  such as is seen with toxic anoxic metabolic encephalopathies.  Clinical correlation is recommended.    Brandon Sharp, M.D.   OZH:YQMV  D:  20/04/2003 16:21:47  T:  20/04/2003 17:22:23  Job #:  [id number]

## 2010-10-12 NOTE — Op Note (Signed)
NAME:  Brandon Sharp, Brandon Sharp              ACCOUNT NO.:  [id number]   MEDICAL RECORD NO.:  [id number]          PATIENT TYPE:  OIB   LOCATION:  2899                         FACILITY:  [facility]   PHYSICIAN:  Janetta Hora. Fields, MD  DATE OF BIRTH:  1946-05-13   DATE OF PROCEDURE:  09/12/2004  DATE OF DISCHARGE:                                 OPERATIVE REPORT   PROCEDURE:  Left upper arm AV graft.   PREOPERATIVE DIAGNOSIS:  End-stage renal disease.   POSTOPERATIVE DIAGNOSIS:  End-stage renal disease.   SURGEON:  Janetta Hora. Darrick Penna, M.D.   ANESTHESIA:  Local with IV sedation.   ASSISTANT:  Vena Austria __________ , RNFA.   FINDINGS:  1.  Small axillary vein with bifurcated system 3-4 mm in diameter.  2.  6-mm PTFE graft end-to-side to vein.   PROCEDURE IN DETAIL:  After obtaining informed consent, the patient was  taken to the operating.  The patient was placed in the supine position on  the operating table.  After adequate sedation, the patient's left upper  extremity was prepped and draped in the usual sterile fashion.  Local  anesthesia was infiltrated just above the antecubital crease.  A  longitudinal incision was made in this location and carried down through the  subcutaneous tissues and an old segment of forearm graft was found in this  location which had been previously anastomosed to the deep brachial vein.  Approximately 4 cm of this was excised to assist in exposing the brachial  artery.  The brachial artery was then controlled proximally and distally  with vessel loops.  Next, attention was turned to the upper arm.  Local  anesthesia was infiltrated up near the axilla. A longitudinal incision was  made in this location and carried down through the subcutaneous tissues.  Median and ulnar nerves were identified and protected from harm's way.  The  axillary vein was dissected free just adjacent to the brachial artery.  It  had a duplicated system and was approximately 3-4 mm in  diameter.  A large  side branch was controlled with a vessel loop.  It was then controlled  proximally and distally with fine bulldog clamps.  A subcutaneous tunnel was  used to connect the lower incision to the upper arm incision and a 6-mm PTFE  graft was brought through this.  The patient was then given 5000 units of  intravenous heparin.  With vascular control of the axillary vein, a  longitudinal venotomy was made, the graft was beveled and sewn end of vein  to her end of graft to side of vein, using a running 6-0 Prolene suture.  At  completion of anastomosis, this was thoroughly flushed with heparinized  saline. The graft was then clamped just above the level of the anastomosis.  The graft was pulled taut to length and beveled on the arterial limb.  Vessel loops were used to control the artery.  A longitudinal arteriotomy  was made and the graft was sewn end to graft to side of artery, using a  running 6-0 Prolene suture.  Just prior completion of the anastomosis  __________ and thoroughly flushed.  The anastomosis was secured, clamps were  released and there was palpable thrill in the graft immediately.  Next,  hemostasis was obtained.  Deep layers of both incisions were then closed  with a running 3-0 Vicryl suture.  The sin of both incisions  was closed with a 4-0 Vicryl subcuticular stitch.  The patient tolerated the  procedure well and there were no complications.  Instrument, sponge, and  needle counts were correct at the end of the case.  The patient was taken to  recovery in stable condition.      CEF/MEDQ  D:  09/12/2004  T:  09/12/2004  Job:  [id number]

## 2010-10-12 NOTE — Op Note (Signed)
NAME:  Brandon Sharp, Brandon Sharp                          ACCOUNT NO.:  [id number]   MEDICAL RECORD NO.:  [id number]                   PATIENT TYPE:  OIB   LOCATION:  2854                                 FACILITY:  [facility]   PHYSICIAN:  Larina Earthly, M.D.                 DATE OF BIRTH:  09-09-1945   DATE OF PROCEDURE:  11/29/2003  DATE OF DISCHARGE:  11/29/2003                                 OPERATIVE REPORT   PREOPERATIVE DIAGNOSIS:  End-stage renal disease with occluded left forearm  loop arteriovenous Gore-Tex graft.   POSTOPERATIVE DIAGNOSIS:  End-stage renal disease with occluded left forearm  loop arteriovenous Gore-Tex graft.   PROCEDURE:  Thrombectomy and interposition jump graft revision of venous  anastomosis of left forearm loop arteriovenous Gore-Tex graft.   SURGEON:  Larina Earthly, M.D.   ASSISTANT:  Coral Ceo, P.A.   ANESTHESIA:  MAC.   COMPLICATIONS:  None.   DISPOSITION:  To recovery room stable.   PROCEDURE IN DETAIL:  The patient was taken to the operating room and placed  in the supine position, where the area of the left arm was prepped and  draped in the usual sterile fashion.  An incision made through the prior  incision at the antecubital space, carried down to isolate the arterial and  venous anastomosis.  The graft was opened near the venous anastomosis  transversely.  There was a moderate stenosis at the venous anastomosis.  The  catheter passed into the central system.  The graft itself was  thrombectomized, the arterialized plug was removed, and excellent inflow was  encountered.  This flushed up nicely and then reoccluded.  The vein was  exposed further proximally and the vein was occluded further proximally.  The old venous anastomosis was excised and a new interposition graft of 7 mm  Gore-Tex was brought onto the field, spatulated, and sewn end-to-end to the  vein with a running 6-0 Prolene suture and then end-to-end to the old graft  with a running  6-0 Prolene suture.  Clamps were removed and a thrill was  noted.  The patient had had a recent thrombolysis and angioplasty throughout  the brachial vein.  A long dilator had been passed with a 5 dilator meeting  no resistance throughout the brachial vein.  On removing the Fogarty  catheter, there was some evidence of sclerosis in the vein.  The wounds were  irrigated with saline and hemostased with electrocautery.  The wounds were  closed with 3-0 Vicryl in the subcutaneous and subcuticular tissue and  Benzoin and Steri-Strips were applied.                                               Larina Earthly, M.D.    TFE/MEDQ  D:  11/29/2003  T:  11/30/2003  Job:  [id number]

## 2010-10-12 NOTE — Discharge Summary (Signed)
NAME:  Brandon Sharp, Brandon Sharp                          ACCOUNT NO.:  [id number]   MEDICAL RECORD NO.:  [id number]                   PATIENT TYPE:  INP   LOCATION:  3027                                 FACILITY:  [facility]   PHYSICIAN:  Madaline Guthrie, M.D.                 DATE OF BIRTH:  04/21/1946   DATE OF ADMISSION:  05/14/2003  DATE OF DISCHARGE:  05/23/2003                                 DISCHARGE SUMMARY   DISCHARGE DIAGNOSES:  1. Hypertension with emergency with PRES syndrome.  2. Seizure secondary to hypertension.  3. Acute renal failure on chronic renal failure.  4. 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  5. Hypertension.  6. Diabetes type 2.  7. Dyslipidemia.  8. Diarrhea/thrombocytopenia.   DISCHARGE MEDICATIONS:  1. Norvasc 10 mg p.o. b.i.d.  2. Clonidine 0.4 mg p.o. b.i.d.  3. Cyclosporin 50 mg p.o. b.i.d.  4. Lasix 40 mg p.o. q.d.  5. Hydralazine 50 mg p.o. q.i.d.  6. Amaryl 8 mg p.o. q.d.  7. Niacin 500 mg p.o. q.d.  8. Labetalol 200 mg p.o. b.i.d.  9. Prednisone 30 mg p.o. q.d.  10.      Flagyl 500 mg p.o. t.i.d.  11.      Rapamune 1 mg p.o. q.d.  12.      Aspirin 81 mg p.o. q.d.  13.      OS-Cal 500 mg p.o. b.i.d.  14.      Pravachol 20 mg p.o. q.h.s.  15.      Potassium chloride 20 mg p.o. q.d.   DISPOSITION:  Improved.   PROCEDURE:  1. Head CT on May 14, 2003 showed negative for bleed or any acute     intracranial process with mild parenchymal atrophy.  2. Portable chest x-ray on May 14, 2003 showed mild right base     atelectasis.  3. Abdominal and pelvic CT on May 14, 2003 showed ascites, hepatic     cyst, thickening of the cecal wall possibly due to ischemic or infectious     colitis. Also thickening of the cecum, which may be due to infectious or     ischemic colitis.  4. On May 15, 2003, the patient had a catheter placement which in     followup chest x-ray showed no pneumothorax following central  line     insertion. Right lower lobe atelectasis, cardiomegaly, vascular     congestion status post coronary artery bypass grafting.  5. May 16, 2003 the patient had a portable chest x-ray which showed     pulmonary vascular congestion, extensive right lower lobe     atelectasis/consolidation with pleural effusion again noted without     improvement and the fact that there might have been some progression.  6. On May 16, 2003 MRI of the brain limited without contrast and MRA of     head without contrast showed bilateral relatively  symmetric temporal     occipital hyperintensity, which is mildly positive and diffuse weighted     images consistent with cyclosporine toxicity. They also saw 1 cm area of     T2 shortening in the right posterior terminal region. MRA of the     intracranial circulation states it is a limited study, grossly negative     MRA of the intracranial circulation.  7. May 18, 2003 a portable chest x-ray showed no interval change.  8. May 19, 2003 a portable chest x-ray showed mild pulmonary vascular     congestion. No overt congestive heart failure findings. Small pleural     effusion, larger on the right. Overall intervally improved aeration of     the lungs, however, right lower lobe atelectasis/consolidation with     effusion persists.  9. A 2-view chest x-ray done on May 19, 2003 which showed stable     appearance of the chest. This examination is not diagnostic and     eliminates the possibility of pulmonary embolus. A chest CT or a     ventilation perfusion lung scan or pulmonary angiography would be test of     choice for that indication.  10.      May 08, 2003 a chest x-ray showed bilateral effusions with     progressive bibasilar atelectasis.   CONSULTATIONS:  1. Delano Metz, M.D. Nephrology.  2. Delia Heady, M.D.  3. Marcelino Freestone, M.D.   HISTORY OF PRESENT ILLNESS:  Please see the full H&P in the patient's  medical  record but in short, Brandon Sharp is a 65 year old African-  American male with extensive coronary artery disease status post heart  transplant in February of 2001 who is here secondary to altered mental  status. The morning of admission, per his wife, he had been having some  abdominal pain x3 days. He came to the emergency department the night before  and was given Ciprofloxacin and Phenergan, which he never took. The morning  of admission, wife notes that the patient was confused. He was unable to  move the right side of his body. He was initially communicative with the  wife but later, was unable to move the right arm. Later, he was noted to  have generalized tonic-clonic seizure observed by EMS. Per EMS, he had some  right sided weakness noted first and then later, had this generalized tonic-  clonic activity. His blood pressure at that time was 230/130. His pulse was  140. His respiratory rate was 24.   PHYSICAL EXAMINATION:  VITAL SIGNS:  On admission I do not have his  temperature but his pulse was 132, respiratory rate 24, and his blood  pressure was 179/86. He was sating 98% on 2 liters of oxygen.  GENERAL:  He was unresponsive and confused.  HEENT:  Eyes, showed equally round and reactive to light and accommodation.  ENT noncontributory.  NECK:  Supple without lymphadenopathy.  RESPIRATORY:  No wheezes or rhonchi. Good air movement bilaterally.  CARDIOVASCULAR:  He was tachycardiac but no murmur, rub, or gallop noted.  GASTROINTESTINAL:  His stomach was flat with positive bowel sounds.  EXTREMITIES:  No clubbing, cyanosis, or edema. He was able to move all  extremities.  SKIN:  Warm with multiple scars on the chest and abdomen.  NEUROLOGIC:  He was confused and unresponsive. Mild weakness noted in the  right upper extremity and there was marked right facial asymmetry.   LABORATORY DATA:  Sodium 137, potassium  4, chloride 102, CO2 11, BUN 60, creatinine 5.7, glucose  317. White blood cell count was 13.6. Hemoglobin  16.5. Platelet count 139,000. ANC 10, MCV 79, anion gap was 24, bilirubin  0.6, AST 33, ALT less than 8. Protein 5.2, albumin 2.5, calcium 9.3. ABG  showed pH of 7.24, a CO2 of 22, a pO2 of 76, and bicarb of 9.5 with  saturation 93% on 2 liters. His PT was 12.6, INR 0.9, platelet count was 21.   HOSPITAL COURSE:  1. Hypertensive emergency:  The patient was admitted to the Intensive Care     Unit with blood pressure's in the 200's over 100's and an anion gap of     24, showing some end organ damage with a BUN and creatinine of 60 and 5.7     respectively. His blood pressure began to improve. The goal blood     pressure was 130's over 150's. The ideal regimen for his control was     Clonidine 0.4 mg b.i.d., Norvasc 10 mg b.i.d., Lasix 40 mg q.d.,     Labetalol 200 mg b.i.d. and Hydralazine 50 mg q.i.d. Neurology was     consulted and their recommendations were followed for the ideal systolic     blood pressure being between 130's to 150's. He was initially treated     with Clonidine 0.2 mg q. 4 hours p.r.n. in addition to the other blood     pressure regimen. However, over the hospital course, his need for p.r.n.     blood pressure medicines decreased. In 3 days before discharge, he was     not requiring any p.r.n. blood pressure medicines.   1. Seizure:  This was thought to be secondary to problem #1. On admission,     the patient was treated with Depacon. It was thought that his altered     mental status was secondary to his being post ictal confusion. After 1     day, his altered mental status resolved. The patient also had a metabolic     acidosis with a high lactic acid level and that was attributed to the     seizure that he had that was witnessed by EMS. All other causes for his     altered mental status were, however, ruled out. His head CT was done and     was noted to have the results mentioned above. His head MRI again showed      the results that were thought to be consistent with posterior reversible     encephalopathic syndrome that would correlate with problem #1. An     electroencephalogram was done which showed diffuse generalized slowing     without seizure that was recommended by neurology. His urine drug screen     was negative. His cyclosporin levels were all therapeutic without showing     any toxicity. Salicylate levels did not show signs of toxicity. His UA     did not show any signs of infection. His TSH was normal. His RPR and B12     were normal as well. It was recommended by neurology that he started     taking scheduled Depakote but on May 20, 2003 he developed diarrhea.     Because the diarrhea was listed as a side effect of the medication the     patient had not been observed to have any more seizures since admission     and with normal mentation, it was agreed by neurology  that the patient     could stop the Depakote and thus, this medication was discontinued. The    patient, prior to being discharged, was observed 2 days without the     Depakote and did not have any further seizures. It was recommended by     neurology that the patient had a followup MRI in 1 to 2 weeks post     discharge and it was agreed that Dr. Riley Kill would see him on post     discharge as well.   1. Acute renal failure on chronic renal insufficiency:  The patient was     admitted with a creatinine of 4.5 which peaked to 5.7 on May 14, 2003 and then slowly decreased back to baseline with creatinine of 3.3 at     discharge. Nephrology was consulted on admission. He was given a bicarb     drip for the acidosis. Over time, his renal function improved throughout     the course and it was found that he did not require hemodialysis for that     reason. Because his cyclosporin levels were continuously non-toxic, it     was thought that the cause of his acute renal failure was from a     different source. The  etiology of his acute renal failure is still     unknown at this time and it was thought that he would likely need a     biopsy in the future. [name] has agreed to see the patient for a     hospital followup to further investigate this problem.   1. Fever:  On admission, the patient was found to have a fever, lactic     acidosis, altered mental status and then was started on empiric Avelox     IV. During the course, the patient improved. However, resolution of all     of his symptoms including fever, lactic acidosis and altered mental     status very quickly. On May 19, 2003 the patient was found to be     hypoxic with O2 saturations in the 60's on room air. Chest x-ray was done     which read as bibasilar atelectasis. However, in reviewing with the team     showed that the chest x-ray seemed unchanged. This information plus the     patient's clinical improvement and oxygenation greater than 92% on room     air suggested that the patient was better. Therefore, given that he     showed no signs of infection and continued improvement, his Avelox was     stopped after 1 week of therapy.   1. Guaiac positive stools:  On admission the patient was found to have a     hemoglobin of 15.5. Throughout the hospital course, the patient's     hemoglobin slowly decreased with a hemoglobin on May 17, 2003 of     12.5 and progressively lowering and stabilizing to a hemoglobin of     approximately 12. On admission, a Hemoccult test did find the patient to     have guaiac positive stools. Because of the urgency of his other     problems, it was decided that the patient would probably benefit from an     outpatient workup of his anemia with the recommendation that this be     continued on an outpatient basis.   1. Immune suppression for heart transplant:  During the course the  patient's     regimen was held except for the steroids until May 19, 2003. It was     then decided that the  patient had improved markedly and therefore, his    immuno suppressive therapy should be resumed. The patient was placed back     on Rapamune 1 mg daily and Cyclosporin 50 mg daily. He tolerated this     treatment well.   1. Diarrhea:  The patient was felt to be C-difficile positive on April 20, 2003. Because he was having diarrhea, the patient was started on p.o.     Flagyl and was given a prescription for a total of 10 days. During     hospitalization, the patient's diarrhea symptoms began to resolve.   LABORATORY DATA:  On discharge his white blood cell count was 11.7.  Hemoglobin 11, hematocrit 32.5, platelet count 107,000. Sodium 134,  potassium 3.5, chloride 100, CO2 26, glucose 176, BUN 38, creatinine 3.3,  calcium 7.8.   On April 19, 2003 the patient was noted to have a decreased platelet  count of 97,000. This was found after the patient had resumed his  cyclosporin. Therefore, the patient's discharge was delayed for 1 day to see  if this thrombocytopenia would correct and it in fact did and he was  discharged with platelet count of 107,000 and therefore, it was not  concerning that he was suffering from any adverse effects after resuming his  immuno suppressive therapy.   FOLLOW UP:  1. The patient is to have a followup appointment with [name] on June 07, 2003.  2. The patient is to followup with myself on June 10, 2003.  3. Also the patient was instructed, and this was reiterated with him and his     wife that this was the most important part, that he be seen by his     transplant physician, Dr. Aundria Rud of [facility].   I have a hard time talking to anyone at the transplant center and the  patient stated that the main way to talk to anyone was to talk to his  transplant nurse, whose name is Misty Stanley. I encouraged him to do that. I  scheduled a hospital followup with him, just to make sure that he does in  fact, see his transplant  specialist. Basically, I would like to make sure  that he is on the correct immuno suppressive therapy and that the physician  is aware of the changes that we have made with his medications. I urged him  to see his transplant physician sooner, rather than later.   Again, the patient is to followup with Dr. Aundria Rud, [name], Dr. Pearlean Brownie and  myself, to make sure that all of his needs are taken care of.   Labs when I see him in the outpatient clinic, if not already done so by  either of these physicians. I would like a basic metabolic panel done to see  how these medicines are affecting his renal function as well as his  potassium. If either physician, who sees him first, has these labs done in  his office, I would appreciate a copy.      Foye Clock, MD                         Madaline Guthrie, M.D.    JH/MEDQ  D:  05/24/2003  T:  05/25/2003  Job:  [id number]   cc:   Vergia Alcon, M.D.   [facility]   Maree Krabbe, M.D.  Fax: [phone]   Pramod P. Pearlean Brownie, MD  Fax: [phone]  Gustavus Messing. Orlin Hilding, M.D.  [address]  [address]  [place]  [place] [address]  Fax: [phone]   Garnetta Buddy, M.D.  [address]  [place]  [place] [address]  Fax: [phone]   Dr. Gray Bernhardt NO# [phone]

## 2010-10-12 NOTE — Discharge Summary (Signed)
NAME:  Brandon Sharp, Brandon Sharp                          ACCOUNT NO.:  [id number]   MEDICAL RECORD NO.:  [id number]                   PATIENT TYPE:  INP   LOCATION:  5525                                 FACILITY:  [facility]   PHYSICIAN:  Adonis Housekeeper, M.D.                   DATE OF BIRTH:  1945-10-06   DATE OF ADMISSION:  07/22/2003  DATE OF DISCHARGE:  07/25/2003                                 DISCHARGE SUMMARY   DISCHARGE DIAGNOSES:  1. Shortness of breath secondary to pulmonary edema.  2. Hypertension.  3. Diabetes mellitus.  4. End-stage renal disease on hemodialysis on Tuesday, Thursday, and     Saturday at [facility].  5. Coronary artery disease.  6. Hyperlipidemia.  7. Anemia.  8. History of colon polyps.   DISCHARGE MEDICATIONS:  1. Prednisone 3 mg 1 p.o. q.d.  2. Norvasc 10 mg 1 p.o. q.h.s.  3. Labetalol 200 mg 1 p.o. q.h.s.  4. Rapamune 1 mg 1 p.o. q.d.  5. _____________ 50 mg 1 p.o. b.i.d.  6. Aspirin 81 mg 1 p.o. q.d.  7. Nephro-Vite 1 tablet 1 tablet 1 q.d.  8. Pravachol 20 mg 1 p.o. q.d.  9. Protonix 40 mg 1 p.o. q.d.  10.      Amaryl 8 mg 1 p.o. q.d.  11.      Calcium carbonate 500 mg 1 p.o. q.i.d.  12.      Phenergan 12.5 mg 1 p.o. q.6h. p.r.n.  13.      Epogen 4500 unit every hemodialysis.  14.      InFeD 50 mg IV every Thursday.  15.      Calcitrol 1 mcg IV every hemodialysis.   PROCEDURE:  Chest x-ray initial done on July 23, 2003 showed heart  failure with early ____________ pulmonary edema.  Later on that day showed  internal resolution asymmetric pulmonary edema on the right following  hemodialysis, no acute findings.   HISTORY OF PRESENT ILLNESS:  A 65 year old black male with past medical  history significant for hypertension, end-stage renal disease, coronary  disease, status post transplant in February.  He comes in to the ER with  increased shortness of breath for the past two to three days.  Also comes in  with some cough with some sputum production,  not any particular color.  Had  one episode of vomiting.  No diarrhea and no abdominal pain.  No sick  contacts.   ALLERGIES:  PENICILLIN causes rash.  LISINOPRIL causes swelling.   PAST MEDICAL HISTORY:  As discharge diagnoses above.   MEDICATIONS:  As medications above.   SOCIAL HISTORY:  He is married.  He has two children.  Retired Hydrographic surveyor.  No alcohol or illicit drug use.  History of tobacco use, though quit in  1989.   FAMILY HISTORY:  Mother and brother have heart disease and brother had  diabetes.   REVIEW OF SYMPTOMS:  No diarrhea, loss of consciousness, or palpitations.  No left arm radiation or pain.   PHYSICAL EXAMINATION:  VITAL SIGNS:  Pulse of 115 per minute, temperature  97, respiratory rate 20 per minute, blood pressure 125/111.  O2 saturation  91% on two liters.  CBGs of 150 to 139.  Weight 70.9 down 58.1 after  dialysis.  HEENT:  Head:  Normocephalic and atraumatic.  Eyes:  PERRLA.  Extraocular  movements intact.  Nose:  Normal nasal turbinates.  Throat:  No exudates.  NECK:  No JVD.  CARDIOVASCULAR:  Regular rate and rhythm.  Slightly tachycardic.  LUNGS:  Clear to auscultation bilaterally.  No wheezes.  Basilar rhonchi  present on the right side.  ABDOMEN:  Soft.  Bowel sounds are heard.  EXTREMITIES:  No clubbing, cyanosis, or edema.  NEUROLOGICAL:  Grossly intact.   LABORATORY DATA:  His labs on the day of admission showed a sodium 133,  potassium 4.5, chloride 91, bicarbonate 27, BUN 24, creatinine 6.5, and  serum glucose of 130.  BNP was greater than 3200.  Total bilirubin 0.9.  Alkaline phosphate 57, AST 21, ALT 11, total protein 6.6, and albumin 2.1.  Calcium of 10.  White count 9.2, hemoglobin 13.9, hematocrit 41.9, platelets  of 287,000.  CK 62, 3.0, 0.04.  Troponin I 28, 1.0, 0.06.   Chest x-ray shows vascular congestion with questionable early consolidation  of right lower lobe.   HOSPITAL COURSE:  Problem 1:  SHORTNESS OF BREATH WITH  INITIAL CHEST X-RAY  SHOWING PULMONARY EDEMA:  He was immediately dialyzed and with a  questionable pneumonia.  He was put on p.o. Avelox 100 mg once a day.  After  dialysis on the first day, he was again dialyzed on Monday, July 25, 2003, developed marked fluid loss.  His estimated dry weight was 89 to 67  kg.   As his repeat x-rays showed a decrease in the left lower lobe pneumonia,  Avelox was stopped and he would go home today with dialysis on July 26, 2003.   Problem 2:  END-STAGE RENAL DISEASE:  Hemodialysis on Tuesday, Thursday, and  Saturday.  Will dialyze tomorrow at [facility].   Problem 3:  HYPERTENSION:  Blood pressure was pretty well controlled on his  home medications.   Problem 4:  DIABETES:  Will continue monitoring in the hospital and his CBGs  were under good control.   Problem 5:  CORONARY ARTERY DISEASE:  CPK-MB and troponin I was checked x 3  which were all negative.  EKG showed no changes.   Problem 6:  STATUS POST HEART TRANSPLANT:  We continued his prednisone,  imipramine, and cyclosporin throughout his hospitalization.  Also a 2-D  echocardiogram was checked in house.  The patient will be followed up as an  outpatient.   Problem 7:  HYPERLIPIDEMIA:  He was put on Pravachol throughout his stay.   Problem 8:  ANEMIA:  Was on Epogen and iron with hemodialysis.   DISCHARGE LABORATORY DATA:  On the day of discharge, labs were sodium 134,  potassium 3.8, chloride 89, bicarbonate 25, BUN 27, creatinine 7.5, serum  glucose 55.  White count 7.1, hemoglobin 12.6, hematocrit 38.6, and  platelets of 276,000.  Albumin of 2.8, calcium 9.2, phosphorus 2.9.  These  labs were all drawn before dialysis.   DISPOSITION:  He was to go back tomorrow to [facility].  His  estimated dry weight is 67 kg.  A 2-D echocardiogram  was performed in hospital on July 25, 2003.  The results are not available at the time of  discharge.                                                 Adonis Housekeeper, M.D.    TS/MEDQ  D:  07/25/2003  T:  07/25/2003  Job:  [id number]   cc:   [facility]

## 2010-10-12 NOTE — Consult Note (Signed)
NAME:  Brandon Sharp, Brandon Sharp NO.:  [id number]   MEDICAL RECORD NO.:  [id number]                   PATIENT TYPE:  INP   LOCATION:  5508                                 FACILITY:  [facility]   PHYSICIAN:  Garnetta Buddy, M.D.                DATE OF BIRTH:  04-Oct-1945   DATE OF CONSULTATION:  06/09/2003  DATE OF DISCHARGE:                                   CONSULTATION   REFERRING PHYSICIAN:  Dr. Worthy Flank.   REASON FOR CONSULTATION:  [age]-year-old African American male, status  post orthotopic heart transplant for ischemic cardiomyopathy, February 2001.  Increased urine creatinine in April 2004, went to [facility] and  discharged with the same creatinine of 3.6, estimated creatinine clearance  of 20 mL per minute, 4 g proteinuria, history of diabetes mellitus, type 2,  x5 years.  He was admitted to [facility], May 04, 2003 to May 23, 2003, with hypertensive urgency.   He presents with nausea and vomiting x1 day, cold chills, weakness x48 hours  and increasing ankle edema and increasing blood pressure.  He complains of  dull central abdominal pain, grade 6/10, with no evidence of any radiation.  Denies any diarrhea.  He has been vomiting.  Denies any fever, chest pain,  shortness of breath, cough or diarrhea.   PAST MEDICAL HISTORY:  1. Status post orthotopic heart transplant, February 2001, [facility].  2. Ischemic cardiomyopathy.  3. History of hypertension.  4. History of coronary artery disease, status post myocardial infarction x3     in 1986, 1989 and 1999.  5. History of ventricular fibrillation, status post AICD placement in 1996     with removal of AICD in 2001.  6. Diabetes mellitus, type 2, x5 years.  7. Hyperlipidemia.  8. Status post CABG, June 1990.  9. History of colon polyps, status post colonoscopy 2 years ago.   MEDICATIONS:  1. Rapamune 1 mg daily.  2. Cyclosporine 50 mg b.i.d.  3.  Prednisone 3 mg daily.  4. Niaspan 500 mg daily.  5. Os-Cal D 500 mg daily.  6. Clonidine 0.4 mg b.i.d.  7. Norvasc 10 mg daily.  8. Amaryl 8 mg daily.  9. Pravachol 20 mg daily.  10.      Nephro-Vite 1 daily.  11.      Lasix 40 mg daily.  12.      Hydralazine 10 mg t.i.d.   ALLERGIES:  PENICILLIN and LISINOPRIL.   SOCIAL HISTORY:  Married and lives with wife Britta Mccreedy.  He has 1 son named  Gery Pray, 73.  He quit smoking in 1989.  He is a Fish farm manager.   FAMILY HISTORY:  His mother died at 29 of cardiovascular disease.  His  father died at 37 of COPD.  He has 3 brothers.   REVIEW OF SYSTEMS:  GENERAL:  He complains of  no fever/chills but just  states he feels cold.  Denies any night sweats.  Denies any weight loss.  EYES:  Denies visual complaints.  Retinal evaluation 1 month ago -- no  evidence for retinopathy.  Wears glasses.  EARS, NOSE AND THROAT:  No  hearing loss.  No sinusitis.  No cough.  No soreness in throat.  CARDIOVASCULAR:  Denies anginal chest pain, orthopnea, PND, palpitations, or  blackout spells.  RESPIRATORY:  Denies cough, wheezes, hemoptysis and is  currently a nonsmoker.  ABDOMINAL SYSTEM:  He admits to a 25-pound weight  loss and diminished appetite during episode of acute renal failure.  He has  had a 3-pound increase in weight when seen on April 14, 2003.  He states  he has abdominal pain and had undergone colonoscopy 2 years ago with  polypectomy at [facility].  He has nausea and vomiting.  Denies any hematochezia or bright red blood per stool.  UROGENITAL:  He  urinates 4 to 5 times per night.  Denies any difficulty.  He has no history  of urgency or dysuria.  Denies any hematuria.  Admits to swelling in his  legs over the past 1 month with foaminess and frothiness in his urine.  He  denies any history of renal calculi.  NEUROLOGIC:  He denies any history of  stroke or seizures.  Denies numbness, tingling, pins and needles or  weakness  in his upper and lower extremities.  Denies double vision, blurred vision,  difficulty with hearing, difficulty with speech, difficulty with swallowing.  ENDOCRINE:  He has a history of diabetes and familial hyperlipidemia and is  currently under therapy.  He has no history of obesity.  HEMATOLOGIC/ONCOLOGIC:  He denies any history of anemia.  No history of DVT.  No history of pulmonary embolus.  No history of cancer.  DERMATOLOGIC:  He  denies any history of skin rashes.  MUSCULOSKELETAL:  He denies muscle pain  or muscle weakness, any joint swelling.  He does not take over-the-counter  nonsteroidal anti-inflammatory drugs.   PHYSICAL EXAMINATION:  GENERAL:  Alert, pleasant gentleman in no obvious  distress, resting comfortably in bed.  VITAL SIGNS:  Blood pressure 160/100, pulse of 104, temperature 98.1.  HEAD AND EYES:  Normocephalic, atraumatic.  No icterus.  No pallor.  Extraocular movements are intact.  Pupils are round, equal and reactive.  EARS, NOSE, MOUTH AND THROAT:  External appearance was normal.  Oropharynx  was clear.  NECK:  Neck was supple with no thyromegaly, no adenopathy.  JVD not  elevated.  CARDIOVASCULAR:  Regular rate and rhythm.  No heaves, no thrills, no rubs,  no gallops.  RESPIRATORY:  Lung fields were clear to auscultation with no wheezes, no  rales.  Percussion note was resonant throughout.  ABDOMINAL SYSTEM:  Abdomen was soft and nontender.  Bowel sounds were  present.  No hepatosplenomegaly palpable.  Hernial orifices were normal.  GENITALIA:  He was uncircumcised with no evidence of testicular swelling.  RECTAL:  Deferred.  PERIPHERAL VASCULAR:  He had dorsalis pedis pulses and palpable femorals  with no femoral bruits.  NEUROLOGIC:  Cranial nerves II-XII were intact with normal reflex, 2+,  symmetric, upper and lower.  No clonus.  He had mild muscle twitching with no fasciculations.  Sensory:  He had normal sensation to vibration and light   touch in the lower extremities.  Cerebellar:  No evidence of cerebellar  ataxia.  EXTREMITIES:  No cyanosis or clubbing.  He had 3 to 4+ pitting edema to the  knees.   LABORATORY DATA:  Most recent labs:  WBC 4.3, hemoglobin 15.9, platelets  227,000.  Sodium 130, potassium 4, chloride 101, BUN 41, creatinine 5,  glucose 116.   ASSESSMENT AND PLAN:  1. Status post heart transplant, stable.  2. Questionable renal insufficiency secondary to diabetic nephropathy and     nephrotoxicity.  The patient appears uremic.  I will go ahead and rule     out infection with blood cultures x2, urinalysis, chest x-ray.  Discuss     with Dr. Balinda Quails.  Will make n.p.o. after midnight and Diatek     catheter in the morning.  3. Diabetes mellitus, type 2.  Sliding-scale coverage.  4. Hyperlipidemia.  Pravachol 20 mg daily.  5. Hypertension.  Add hydralazine 10 mg t.i.d. and Lasix 80 mg b.i.d.                                               Garnetta Buddy, M.D.    MWW/MEDQ  D:  06/09/2003  T:  06/10/2003  Job:  [id number]

## 2010-11-01 ENCOUNTER — Ambulatory Visit: Payer: Medicare Other | Admitting: Vascular Surgery

## 2010-11-14 ENCOUNTER — Encounter (INDEPENDENT_AMBULATORY_CARE_PROVIDER_SITE_OTHER): Payer: Medicare Other

## 2010-11-14 ENCOUNTER — Ambulatory Visit (INDEPENDENT_AMBULATORY_CARE_PROVIDER_SITE_OTHER): Payer: Medicare Other

## 2010-11-14 DIAGNOSIS — T82598A Other mechanical complication of other cardiac and vascular devices and implants, initial encounter: Secondary | ICD-10-CM

## 2010-11-14 DIAGNOSIS — N186 End stage renal disease: Secondary | ICD-10-CM

## 2010-11-14 NOTE — Assessment & Plan Note (Signed)
OFFICE VISIT  EREK, KOWAL DOB:  1945/10/24                                       11/14/2010 ZOXWR#:[id number]  CHIEF COMPLAINT:  Patient is a 65 year old gentleman who is not yet on hemodialysis who had a patch angioplasty of the right arteriovenous fistula with ligation of multiple side branches on 07/09/2010 for nonmaturing AV fistula.  Patient denies any pain, numbness, or tingling in his hand.  He can use his hand well with good grip and sensation.  He had a duplex scan of the fistula today which showed some elevated velocities in the distal portion, questionably due to changes in the vessel diameter.  He had a good thrill and a bruit.  The fistula was very easily palpable for 2/3 of the upper arm.  PHYSICAL EXAMINATION:  This is a well-developed, well-nourished gentleman in no acute distress.  He had good and equal strength in bilateral upper extremities.  He had a good thrill and bruit in the right upper arm AV fistula.  His wounds were well-healed.  His heart rate was 78.  His sats were 100%.  His respiratory rate was 12.  He had 2+ radial and ulnar pulses.  ASSESSMENT:  Maturing easily palpable right upper extremity arteriovenous fistula, brachiocephalic.  This fistula is ready to use at any time when needed for hemodialysis.  Della Goo, PA-C  Quita Skye. Hart Rochester, M.D. Electronically Signed  RR/MEDQ  D:  11/14/2010  T:  11/14/2010  Job:  [id number]

## 2010-11-23 NOTE — Procedures (Unsigned)
VASCULAR LAB EXAM  INDICATION:  Non-maturing fistula.  HISTORY:  EXAM:  Right brachiocephalic AV fistula duplex.  IMPRESSION: 1. Patent right brachiocephalic arteriovenous fistula with a velocity     of 763 cm/sec. noted in the distal brachium level outflow vein.     Marked tortuosity of the outflow vein noted at the right     antecubital fossa level. 2. Depth, diameter, and velocity measurements are noted at the     attached worksheet.  ___________________________________________ Janetta Hora. Fields, MD  CH/MEDQ  D:  11/15/2010  T:  11/15/2010  Job:  [id number]

## 2010-11-27 ENCOUNTER — Emergency Department (HOSPITAL_COMMUNITY)
Admission: EM | Admit: 2010-11-27 | Discharge: 2010-11-27 | Disposition: A | Discharge status: Home / Self Care | Payer: Medicare Other | Attending: Emergency Medicine | Admitting: Emergency Medicine

## 2010-11-27 DIAGNOSIS — I10 Essential (primary) hypertension: Secondary | ICD-10-CM | POA: Insufficient documentation

## 2010-11-27 DIAGNOSIS — E119 Type 2 diabetes mellitus without complications: Secondary | ICD-10-CM | POA: Insufficient documentation

## 2010-11-27 DIAGNOSIS — Z940 Kidney transplant status: Secondary | ICD-10-CM | POA: Insufficient documentation

## 2010-11-27 DIAGNOSIS — I824Z9 Acute embolism and thrombosis of unspecified deep veins of unspecified distal lower extremity: Secondary | ICD-10-CM | POA: Insufficient documentation

## 2010-11-27 DIAGNOSIS — I2510 Atherosclerotic heart disease of native coronary artery without angina pectoris: Secondary | ICD-10-CM | POA: Insufficient documentation

## 2010-11-27 DIAGNOSIS — M7989 Other specified soft tissue disorders: Secondary | ICD-10-CM

## 2011-03-01 LAB — POCT I-STAT 3, ART BLOOD GAS (G3+)
Bicarbonate: 23.4
O2 Saturation: 95
TCO2: 24
pCO2 arterial: 31.2 — ABNORMAL LOW
pH, Arterial: 7.483 — ABNORMAL HIGH
pO2, Arterial: 69 — ABNORMAL LOW

## 2011-03-01 LAB — URINALYSIS, ROUTINE W REFLEX MICROSCOPIC
Nitrite: NEGATIVE
Specific Gravity, Urine: 1.018
Urobilinogen, UA: 1
pH: 5.5

## 2011-03-01 LAB — I-STAT 8, (EC8 V) (CONVERTED LAB)
BUN: 53 — ABNORMAL HIGH
BUN: 54 — ABNORMAL HIGH
Bicarbonate: 25.6 — ABNORMAL HIGH
Chloride: 104
Glucose, Bld: 182 — ABNORMAL HIGH
HCT: 48
Hemoglobin: 15.6
Hemoglobin: 16.3
Operator id: 294511
Sodium: 136
Sodium: 137
TCO2: 27
pH, Ven: 7.405 — ABNORMAL HIGH

## 2011-03-01 LAB — DIFFERENTIAL
Basophils Relative: 1
Eosinophils Absolute: 0 — ABNORMAL LOW
Lymphs Abs: 0.2 — ABNORMAL LOW
Monocytes Absolute: 0.3
Monocytes Relative: 4
Neutrophils Relative %: 92 — ABNORMAL HIGH

## 2011-03-01 LAB — CBC
HCT: 41.8
Hemoglobin: 13.7
MCHC: 32.9
MCV: 91.4
RBC: 4.57
WBC: 7.2

## 2011-03-01 LAB — POCT CARDIAC MARKERS
CKMB, poc: 2.4
Myoglobin, poc: 344

## 2011-03-01 LAB — URINE MICROSCOPIC-ADD ON

## 2011-03-11 ENCOUNTER — Emergency Department (HOSPITAL_COMMUNITY)
Admission: EM | Admit: 2011-03-11 | Discharge: 2011-03-11 | Disposition: A | Discharge status: Home / Self Care | Payer: Medicare Other | Attending: Emergency Medicine | Admitting: Emergency Medicine

## 2011-03-11 DIAGNOSIS — K5641 Fecal impaction: Secondary | ICD-10-CM | POA: Insufficient documentation

## 2011-03-11 DIAGNOSIS — R10819 Abdominal tenderness, unspecified site: Secondary | ICD-10-CM | POA: Insufficient documentation

## 2011-03-11 DIAGNOSIS — Z794 Long term (current) use of insulin: Secondary | ICD-10-CM | POA: Insufficient documentation

## 2011-03-11 DIAGNOSIS — I2510 Atherosclerotic heart disease of native coronary artery without angina pectoris: Secondary | ICD-10-CM | POA: Insufficient documentation

## 2011-03-11 DIAGNOSIS — Z7982 Long term (current) use of aspirin: Secondary | ICD-10-CM | POA: Insufficient documentation

## 2011-03-11 DIAGNOSIS — Z940 Kidney transplant status: Secondary | ICD-10-CM | POA: Insufficient documentation

## 2011-03-11 DIAGNOSIS — Z79899 Other long term (current) drug therapy: Secondary | ICD-10-CM | POA: Insufficient documentation

## 2011-03-11 DIAGNOSIS — E119 Type 2 diabetes mellitus without complications: Secondary | ICD-10-CM | POA: Insufficient documentation

## 2011-03-11 DIAGNOSIS — I10 Essential (primary) hypertension: Secondary | ICD-10-CM | POA: Insufficient documentation

## 2011-04-11 ENCOUNTER — Encounter: Payer: Self-pay | Admitting: Nurse Practitioner

## 2011-04-11 ENCOUNTER — Other Ambulatory Visit: Payer: Self-pay

## 2011-04-11 ENCOUNTER — Emergency Department (HOSPITAL_COMMUNITY): Payer: Medicare Other

## 2011-04-11 ENCOUNTER — Emergency Department (HOSPITAL_COMMUNITY)
Admission: EM | Admit: 2011-04-11 | Discharge: 2011-04-11 | Disposition: A | Discharge status: Home / Self Care | Payer: Medicare Other | Attending: Emergency Medicine | Admitting: Emergency Medicine

## 2011-04-11 DIAGNOSIS — N189 Chronic kidney disease, unspecified: Secondary | ICD-10-CM

## 2011-04-11 DIAGNOSIS — R0602 Shortness of breath: Secondary | ICD-10-CM | POA: Insufficient documentation

## 2011-04-11 DIAGNOSIS — M79609 Pain in unspecified limb: Secondary | ICD-10-CM

## 2011-04-11 DIAGNOSIS — Z7982 Long term (current) use of aspirin: Secondary | ICD-10-CM | POA: Insufficient documentation

## 2011-04-11 DIAGNOSIS — E872 Acidosis, unspecified: Secondary | ICD-10-CM | POA: Insufficient documentation

## 2011-04-11 DIAGNOSIS — R609 Edema, unspecified: Secondary | ICD-10-CM | POA: Insufficient documentation

## 2011-04-11 DIAGNOSIS — R5381 Other malaise: Secondary | ICD-10-CM | POA: Insufficient documentation

## 2011-04-11 DIAGNOSIS — I129 Hypertensive chronic kidney disease with stage 1 through stage 4 chronic kidney disease, or unspecified chronic kidney disease: Secondary | ICD-10-CM | POA: Insufficient documentation

## 2011-04-11 DIAGNOSIS — Z7901 Long term (current) use of anticoagulants: Secondary | ICD-10-CM | POA: Insufficient documentation

## 2011-04-11 DIAGNOSIS — R630 Anorexia: Secondary | ICD-10-CM | POA: Insufficient documentation

## 2011-04-11 DIAGNOSIS — M7989 Other specified soft tissue disorders: Secondary | ICD-10-CM | POA: Insufficient documentation

## 2011-04-11 DIAGNOSIS — E119 Type 2 diabetes mellitus without complications: Secondary | ICD-10-CM | POA: Insufficient documentation

## 2011-04-11 DIAGNOSIS — Z79899 Other long term (current) drug therapy: Secondary | ICD-10-CM | POA: Insufficient documentation

## 2011-04-11 DIAGNOSIS — I252 Old myocardial infarction: Secondary | ICD-10-CM | POA: Insufficient documentation

## 2011-04-11 HISTORY — DX: Acute myocardial infarction, unspecified: I21.9

## 2011-04-11 LAB — COMPREHENSIVE METABOLIC PANEL
AST: 14 U/L (ref 0–37)
BUN: 87 mg/dL — ABNORMAL HIGH (ref 6–23)
CO2: 17 mEq/L — ABNORMAL LOW (ref 19–32)
Calcium: 8.8 mg/dL (ref 8.4–10.5)
Chloride: 115 mEq/L — ABNORMAL HIGH (ref 96–112)
Creatinine, Ser: 4.96 mg/dL — ABNORMAL HIGH (ref 0.50–1.35)
GFR calc Af Amer: 13 mL/min — ABNORMAL LOW (ref >90)
GFR calc non Af Amer: 11 mL/min — ABNORMAL LOW (ref >90)
Total Bilirubin: 0.3 mg/dL (ref 0.3–1.2)

## 2011-04-11 LAB — CBC
HCT: 28.3 % — ABNORMAL LOW (ref 39.0–52.0)
Hemoglobin: 8.9 g/dL — ABNORMAL LOW (ref 13.0–17.0)
MCHC: 31.4 g/dL (ref 30.0–36.0)
MCV: 99 fL (ref 78.0–100.0)
RDW: 16.8 % — ABNORMAL HIGH (ref 11.5–15.5)
WBC: 5.1 10*3/uL (ref 4.0–10.5)

## 2011-04-11 LAB — DIFFERENTIAL
Basophils Absolute: 0 10*3/uL (ref 0.0–0.1)
Eosinophils Relative: 1 % (ref 0–5)
Lymphocytes Relative: 12 % (ref 12–46)
Monocytes Absolute: 0.3 10*3/uL (ref 0.1–1.0)
Monocytes Relative: 6 % (ref 3–12)

## 2011-04-11 LAB — URINALYSIS, ROUTINE W REFLEX MICROSCOPIC
Bilirubin Urine: NEGATIVE
Ketones, ur: NEGATIVE mg/dL
Leukocytes, UA: NEGATIVE
Nitrite: NEGATIVE
Protein, ur: NEGATIVE mg/dL
Urobilinogen, UA: 0.2 mg/dL (ref 0.0–1.0)

## 2011-04-11 LAB — PROTIME-INR
INR: 3.93 — ABNORMAL HIGH (ref 0.00–1.49)
Prothrombin Time: 39 seconds — ABNORMAL HIGH (ref 11.6–15.2)

## 2011-04-11 MED ORDER — SODIUM CHLORIDE 0.9 % IJ SOLN: 3.0000 mL | Freq: Two times a day (BID) | INTRAMUSCULAR | Status: DC

## 2011-04-11 MED ORDER — SODIUM BICARBONATE 650 MG PO TABS: 1300.0000 mg | ORAL_TABLET | Freq: Two times a day (BID) | ORAL | Status: DC

## 2011-04-11 MED ORDER — SODIUM CHLORIDE 0.9 % IJ SOLN: 3.0000 mL | INTRAMUSCULAR | Status: DC | PRN

## 2011-04-11 MED ORDER — TORSEMIDE 100 MG PO TABS: 100.0000 mg | ORAL_TABLET | Freq: Three times a day (TID) | ORAL | Status: DC

## 2011-04-11 NOTE — ED Provider Notes (Signed)
History     CSN: [id number] Arrival date & time: 04/11/2011 11:13 AM   First MD Initiated Contact with Patient 04/11/11 1136      Chief Complaint  Patient presents with  . Weakness    (Consider location/radiation/quality/duration/timing/severity/associated sxs/prior treatment) HPI Patient presents to the emergency room with complaints of weakness, loss of appetite, and extremity swelling. He has also been having trouble with shortness of breath over the past week. Patient denies any pain anywhere. Is not having any chest pain, abdominal pain, extremity pain. He's not had any focal weakness. There has not been any trouble with his speech or balance. Patient states he has not had much of an appetite and is just been feeling overall weak. There has been no vomiting and he denies diarrhea although he has had some loose stools. Patient has significant history of cardiac and renal transplant. Past Medical History  Diagnosis Date  . Renal failure   . Myocardial infarction   . Diabetes mellitus   . Hypertension     Past Surgical History  Procedure Date  . Nephrectomy transplanted organ     History reviewed. No pertinent family history.  History  Substance Use Topics  . Smoking status: Former Smoker    Quit date: 05/27/1988  . Smokeless tobacco: Not on file  . Alcohol Use: No      Review of Systems  All other systems reviewed and are negative.    Allergies  Lisinopril; Norvasc; and Penicillins  Home Medications   Current Outpatient Rx  Name Route Sig Dispense Refill  . ASPIRIN EC 81 MG PO TBEC Oral Take 81 mg by mouth daily.      . ATORVASTATIN CALCIUM 40 MG PO TABS Oral Take 40 mg by mouth daily.      . AZATHIOPRINE 50 MG PO TABS Oral Take 75 mg by mouth daily.      Marland Kitchen VITAMIN D PO Oral Take 1 capsule by mouth daily.      . COLESEVELAM HCL 625 MG PO TABS Oral Take 3,750 mg by mouth daily. Takes 6 tabs daily     . DOXAZOSIN MESYLATE 8 MG PO TABS Oral Take 8 mg by  mouth 2 (two) times daily.      Marland Kitchen FAMOTIDINE 20 MG PO TABS Oral Take 20 mg by mouth daily.      Marland Kitchen HYDRALAZINE HCL 25 MG PO TABS Oral Take 75 mg by mouth 3 (three) times daily.      . ISOSORBIDE DINITRATE 40 MG PO TABS Oral Take 40 mg by mouth 3 (three) times daily.      Marland Kitchen PARICALCITOL 1 MCG PO CAPS Oral Take 1 mcg by mouth See admin instructions. Takes on Mon, Wed & Fri     . PREDNISONE 10 MG PO TABS Oral Take 10 mg by mouth daily.      . SODIUM BICARBONATE 650 MG PO TABS Oral Take 650 mg by mouth 2 (two) times daily.      Marland Kitchen TACROLIMUS 1 MG PO CAPS Oral Take 1 mg by mouth 2 (two) times daily.      . WARFARIN SODIUM 2.5 MG PO TABS Oral Take 2.5 mg by mouth daily.        BP 151/92  Pulse 70  Temp(Src) 97.6 F (36.4 C) (Oral)  Resp 15  SpO2 96%  Physical Exam  Nursing note and vitals reviewed. Constitutional: He appears well-developed and well-nourished. No distress.  HENT:  Head: Normocephalic and atraumatic.  Right Ear:  External ear normal.  Left Ear: External ear normal.  Eyes: Conjunctivae are normal. Right eye exhibits no discharge. Left eye exhibits no discharge. No scleral icterus.       No pallor  Neck: Neck supple. No tracheal deviation present.  Cardiovascular: Normal rate, regular rhythm and intact distal pulses.   Pulmonary/Chest: Effort normal and breath sounds normal. No stridor. No respiratory distress. He has no wheezes. He has no rales.  Abdominal: Soft. Bowel sounds are normal. He exhibits no distension. There is no tenderness. There is no rebound and no guarding.  Musculoskeletal: He exhibits edema. He exhibits no tenderness.       Edema left upper extremity, history of AV fistula that arm; edema of the left lower extremity greater than the right, no erythema or increased pain  Neurological: He is alert. He has normal strength. No sensory deficit. Cranial nerve deficit:  no gross defecits noted. He exhibits normal muscle tone. He displays no seizure activity.  Coordination normal.  Skin: Skin is warm and dry. No rash noted.  Psychiatric: He has a normal mood and affect.    ED Course  Procedures (including critical care time)  Date: 04/11/2011  Rate: 70  Rhythm: normal sinus rhythm  QRS Axis: left  Intervals: normal  ST/T Wave abnormalities: normal  Conduction Disutrbances:Left ventricular hypertrophy  Narrative Interpretation:   Old EKG Reviewed: unchanged   Labs Reviewed  CBC - Abnormal; Notable for the following:    RBC 2.86 (*)    Hemoglobin 8.9 (*)    HCT 28.3 (*)    RDW 16.8 (*)    Platelets 132 (*)    All other components within normal limits  DIFFERENTIAL - Abnormal; Notable for the following:    Neutrophils Relative 81 (*)    Lymphs Abs 0.6 (*)    All other components within normal limits  COMPREHENSIVE METABOLIC PANEL - Abnormal; Notable for the following:    Chloride 115 (*)    CO2 17 (*)    Glucose, Bld 64 (*)    BUN 87 (*)    Creatinine, Ser 4.96 (*)    Total Protein 5.4 (*)    Albumin 2.5 (*)    GFR calc non Af Amer 11 (*)    GFR calc Af Amer 13 (*)    All other components within normal limits  PROTIME-INR - Abnormal; Notable for the following:    Prothrombin Time 39.0 (*)    INR 3.93 (*)    All other components within normal limits  PRO B NATRIURETIC PEPTIDE - Abnormal; Notable for the following:    BNP, POC >70000.0 (*)    All other components within normal limits  URINALYSIS, ROUTINE W REFLEX MICROSCOPIC  I-STAT TROPONIN I   Dg Chest 2 View  04/11/2011  *RADIOLOGY REPORT*  Clinical Data: Weakness, shortness of breath  CHEST - 2 VIEW  Comparison: Portable chest x-ray of 07/19/2010  Findings: There are prominent interstitial markings with prominent pulmonary vascularity most consistent with interstitial edema in this patient with COPD.  Moderate cardiomegaly is present.  Median sternotomy sutures are noted from prior CABG.  No acute bony abnormality is seen.  IMPRESSION:   Probable interstitial edema in  patient with COPD.  Cardiomegaly.  Original Report Authenticated By: Juline Patch, M.D.     Diagnosis: Chronic renal failure #2 metabolic acidosis   MDM  2:21 PM Pt  with history renal failure presents with worsening metabolic acidosis. Patient also appears to have a component of  pulmonary edema. Patient has history of renal transplant. I'm concerned about the possibility of rejection.  I will consult nephrology at this point  2:36 PM I've spoken with Dr. Darrick Penna. He compared the patient's results today from old ones on file at the nephrology center and they actually are not too different than usual. He suspects that the patient is losing his ability to excrete hydrogen with his worsening renal insufficiency. Patient is supposed to be taking torsemide 100 mg by mouth twice a day. He recommends increasing up to 3 times a day. We will also increase his sodium bicarbonate 22 tablets twice a day. I've spoken to the patient and explained the results. He does feel comfortable going home.        Celene Kras, MD 04/11/11 [phone]

## 2011-04-11 NOTE — ED Notes (Signed)
Pt c/o weakness, loss of appetite, extremity swelling, occassional SOB over past week. No pain. A&Ox4, resp e/u

## 2011-04-11 NOTE — Progress Notes (Signed)
*  PRELIMINARY RESULTS*  Left Lower Extremity Venous Duplex has been performed. No obvious DVT seen in Left leg. Farrel Demark 04/11/2011, 1:14 PM

## 2011-04-11 NOTE — ED Notes (Signed)
Vascular lab tech at bedside to take pt for test.

## 2011-04-15 ENCOUNTER — Inpatient Hospital Stay (HOSPITAL_COMMUNITY): Payer: Medicare Other

## 2011-04-15 ENCOUNTER — Emergency Department (HOSPITAL_COMMUNITY): Payer: Medicare Other

## 2011-04-15 ENCOUNTER — Inpatient Hospital Stay (HOSPITAL_COMMUNITY)
Admission: EM | Admit: 2011-04-15 | Discharge: 2011-04-19 | DRG: 682 | Disposition: A | Discharge status: Home / Self Care | Payer: Medicare Other | Attending: Internal Medicine | Admitting: Internal Medicine

## 2011-04-15 ENCOUNTER — Encounter (HOSPITAL_COMMUNITY): Payer: Self-pay

## 2011-04-15 DIAGNOSIS — Z87891 Personal history of nicotine dependence: Secondary | ICD-10-CM

## 2011-04-15 DIAGNOSIS — E118 Type 2 diabetes mellitus with unspecified complications: Secondary | ICD-10-CM

## 2011-04-15 DIAGNOSIS — I5022 Chronic systolic (congestive) heart failure: Secondary | ICD-10-CM | POA: Diagnosis present

## 2011-04-15 DIAGNOSIS — I252 Old myocardial infarction: Secondary | ICD-10-CM

## 2011-04-15 DIAGNOSIS — N185 Chronic kidney disease, stage 5: Secondary | ICD-10-CM | POA: Diagnosis present

## 2011-04-15 DIAGNOSIS — Z7901 Long term (current) use of anticoagulants: Secondary | ICD-10-CM

## 2011-04-15 DIAGNOSIS — T8611 Kidney transplant rejection: Secondary | ICD-10-CM | POA: Diagnosis present

## 2011-04-15 DIAGNOSIS — D649 Anemia, unspecified: Secondary | ICD-10-CM | POA: Diagnosis present

## 2011-04-15 DIAGNOSIS — Z880 Allergy status to penicillin: Secondary | ICD-10-CM

## 2011-04-15 DIAGNOSIS — E785 Hyperlipidemia, unspecified: Secondary | ICD-10-CM | POA: Diagnosis present

## 2011-04-15 DIAGNOSIS — E1129 Type 2 diabetes mellitus with other diabetic kidney complication: Secondary | ICD-10-CM | POA: Diagnosis present

## 2011-04-15 DIAGNOSIS — Z23 Encounter for immunization: Secondary | ICD-10-CM

## 2011-04-15 DIAGNOSIS — Z79899 Other long term (current) drug therapy: Secondary | ICD-10-CM

## 2011-04-15 DIAGNOSIS — Z7982 Long term (current) use of aspirin: Secondary | ICD-10-CM

## 2011-04-15 DIAGNOSIS — I10 Essential (primary) hypertension: Secondary | ICD-10-CM | POA: Diagnosis present

## 2011-04-15 DIAGNOSIS — R791 Abnormal coagulation profile: Secondary | ICD-10-CM | POA: Diagnosis present

## 2011-04-15 DIAGNOSIS — Z794 Long term (current) use of insulin: Secondary | ICD-10-CM

## 2011-04-15 DIAGNOSIS — T45511A Poisoning by anticoagulants, accidental (unintentional), initial encounter: Secondary | ICD-10-CM

## 2011-04-15 DIAGNOSIS — Z940 Kidney transplant status: Secondary | ICD-10-CM

## 2011-04-15 DIAGNOSIS — I509 Heart failure, unspecified: Secondary | ICD-10-CM | POA: Diagnosis present

## 2011-04-15 DIAGNOSIS — T45515A Adverse effect of anticoagulants, initial encounter: Secondary | ICD-10-CM | POA: Diagnosis present

## 2011-04-15 DIAGNOSIS — N184 Chronic kidney disease, stage 4 (severe): Secondary | ICD-10-CM

## 2011-04-15 DIAGNOSIS — Z951 Presence of aortocoronary bypass graft: Secondary | ICD-10-CM

## 2011-04-15 DIAGNOSIS — Z941 Heart transplant status: Secondary | ICD-10-CM

## 2011-04-15 DIAGNOSIS — D689 Coagulation defect, unspecified: Secondary | ICD-10-CM | POA: Diagnosis present

## 2011-04-15 DIAGNOSIS — I82409 Acute embolism and thrombosis of unspecified deep veins of unspecified lower extremity: Secondary | ICD-10-CM | POA: Diagnosis present

## 2011-04-15 DIAGNOSIS — I4901 Ventricular fibrillation: Secondary | ICD-10-CM | POA: Insufficient documentation

## 2011-04-15 DIAGNOSIS — R609 Edema, unspecified: Secondary | ICD-10-CM | POA: Diagnosis present

## 2011-04-15 DIAGNOSIS — N186 End stage renal disease: Secondary | ICD-10-CM | POA: Diagnosis present

## 2011-04-15 DIAGNOSIS — I120 Hypertensive chronic kidney disease with stage 5 chronic kidney disease or end stage renal disease: Principal | ICD-10-CM | POA: Diagnosis present

## 2011-04-15 DIAGNOSIS — IMO0002 Reserved for concepts with insufficient information to code with codable children: Secondary | ICD-10-CM

## 2011-04-15 DIAGNOSIS — R5381 Other malaise: Secondary | ICD-10-CM | POA: Diagnosis present

## 2011-04-15 DIAGNOSIS — R509 Fever, unspecified: Secondary | ICD-10-CM | POA: Diagnosis present

## 2011-04-15 DIAGNOSIS — T8612 Kidney transplant failure: Secondary | ICD-10-CM

## 2011-04-15 DIAGNOSIS — N19 Unspecified kidney failure: Secondary | ICD-10-CM

## 2011-04-15 DIAGNOSIS — Z86718 Personal history of other venous thrombosis and embolism: Secondary | ICD-10-CM

## 2011-04-15 HISTORY — DX: Anemia, unspecified: D64.9

## 2011-04-15 HISTORY — DX: Encounter for other specified aftercare: Z51.89

## 2011-04-15 HISTORY — DX: Reserved for inherently not codable concepts without codable children: IMO0001

## 2011-04-15 LAB — CBC
HCT: 29.3 % — ABNORMAL LOW (ref 39.0–52.0)
Hemoglobin: 9.4 g/dL — ABNORMAL LOW (ref 13.0–17.0)
MCH: 32 pg (ref 26.0–34.0)
MCHC: 32.1 g/dL (ref 30.0–36.0)
MCV: 99.7 fL (ref 78.0–100.0)
RBC: 2.94 MIL/uL — ABNORMAL LOW (ref 4.22–5.81)

## 2011-04-15 LAB — BASIC METABOLIC PANEL
BUN: 80 mg/dL — ABNORMAL HIGH (ref 6–23)
CO2: 17 mEq/L — ABNORMAL LOW (ref 19–32)
Chloride: 115 mEq/L — ABNORMAL HIGH (ref 96–112)
Glucose, Bld: 90 mg/dL (ref 70–99)
Potassium: 4.2 mEq/L (ref 3.5–5.1)
Sodium: 146 mEq/L — ABNORMAL HIGH (ref 135–145)

## 2011-04-15 LAB — IRON AND TIBC
Saturation Ratios: 17 % — ABNORMAL LOW (ref 20–55)
TIBC: 196 ug/dL — ABNORMAL LOW (ref 215–435)
UIBC: 162 ug/dL (ref 125–400)

## 2011-04-15 MED ORDER — INSULIN ASPART 100 UNIT/ML ~~LOC~~ SOLN
0.0000 [IU] | Freq: Three times a day (TID) | SUBCUTANEOUS | Status: DC
  Administered 2011-04-16: 2 [IU] via SUBCUTANEOUS
  Administered 2011-04-17: 1 [IU] via SUBCUTANEOUS
  Administered 2011-04-18 (×2): 2 [IU] via SUBCUTANEOUS
  Filled 2011-04-15: qty 3

## 2011-04-15 MED ORDER — NEPRO/CARBSTEADY PO LIQD: 237.0000 mL | ORAL | Status: DC | PRN

## 2011-04-15 MED ORDER — AZATHIOPRINE 50 MG PO TABS
75.0000 mg | ORAL_TABLET | Freq: Every day | ORAL | Status: DC
  Administered 2011-04-16 – 2011-04-19 (×4): 75 mg via ORAL
  Filled 2011-04-15 (×6): qty 2

## 2011-04-15 MED ORDER — CARVEDILOL 25 MG PO TABS
50.0000 mg | ORAL_TABLET | Freq: Two times a day (BID) | ORAL | Status: DC
  Administered 2011-04-16 – 2011-04-19 (×8): 50 mg via ORAL
  Filled 2011-04-15 (×11): qty 2

## 2011-04-15 MED ORDER — TACROLIMUS 1 MG PO CAPS
1.0000 mg | ORAL_CAPSULE | Freq: Two times a day (BID) | ORAL | Status: DC
  Administered 2011-04-16 – 2011-04-19 (×8): 1 mg via ORAL
  Filled 2011-04-15 (×10): qty 1

## 2011-04-15 MED ORDER — FAMOTIDINE 20 MG PO TABS
20.0000 mg | ORAL_TABLET | Freq: Every day | ORAL | Status: DC
  Administered 2011-04-16 – 2011-04-19 (×5): 20 mg via ORAL
  Filled 2011-04-15 (×5): qty 1

## 2011-04-15 MED ORDER — DOXAZOSIN MESYLATE 8 MG PO TABS
8.0000 mg | ORAL_TABLET | Freq: Two times a day (BID) | ORAL | Status: DC
  Filled 2011-04-15 (×3): qty 1

## 2011-04-15 MED ORDER — PREDNISONE 10 MG PO TABS
10.0000 mg | ORAL_TABLET | Freq: Every day | ORAL | Status: DC
  Administered 2011-04-16 – 2011-04-18 (×3): 10 mg via ORAL
  Filled 2011-04-15 (×6): qty 1

## 2011-04-15 MED ORDER — AZATHIOPRINE 50 MG PO TABS
75.0000 mg | ORAL_TABLET | Freq: Every day | ORAL | Status: DC
  Administered 2011-04-16: 75 mg via ORAL
  Filled 2011-04-15 (×2): qty 2

## 2011-04-15 MED ORDER — INFLUENZA VIRUS VACC SPLIT PF IM SUSP
0.5000 mL | INTRAMUSCULAR | Status: AC
Start: 2011-04-16 — End: 2011-04-17
  Administered 2011-04-17: 0.5 mL via INTRAMUSCULAR
  Filled 2011-04-15: qty 0.5

## 2011-04-15 MED ORDER — HYDRALAZINE HCL 50 MG PO TABS
75.0000 mg | ORAL_TABLET | Freq: Three times a day (TID) | ORAL | Status: DC
  Administered 2011-04-16 (×2): 75 mg via ORAL
  Filled 2011-04-15 (×4): qty 1

## 2011-04-15 MED ORDER — INSULIN ASPART 100 UNIT/ML ~~LOC~~ SOLN: 0.0000 [IU] | Freq: Every day | SUBCUTANEOUS | Status: DC

## 2011-04-15 MED ORDER — ISOSORBIDE DINITRATE 20 MG PO TABS: 40.0000 mg | ORAL_TABLET | Freq: Three times a day (TID) | ORAL | Status: DC

## 2011-04-15 MED ORDER — PARICALCITOL 5 MCG/ML IV SOLN
INTRAVENOUS | Status: AC
  Administered 2011-04-15: 1 ug via INTRAVENOUS
  Filled 2011-04-15: qty 1

## 2011-04-15 MED ORDER — HEPARIN SODIUM (PORCINE) 1000 UNIT/ML DIALYSIS
1000.0000 [IU] | INTRAMUSCULAR | Status: DC | PRN
  Filled 2011-04-15: qty 1

## 2011-04-15 MED ORDER — TORSEMIDE 100 MG PO TABS
100.0000 mg | ORAL_TABLET | Freq: Three times a day (TID) | ORAL | Status: DC
  Administered 2011-04-16 – 2011-04-18 (×8): 100 mg via ORAL
  Filled 2011-04-15 (×14): qty 1

## 2011-04-15 MED ORDER — ALTEPLASE 2 MG IJ SOLR
2.0000 mg | Freq: Once | INTRAMUSCULAR | Status: AC | PRN
  Filled 2011-04-15: qty 2

## 2011-04-15 MED ORDER — TACROLIMUS 1 MG PO CAPS
1.0000 mg | ORAL_CAPSULE | Freq: Two times a day (BID) | ORAL | Status: DC
  Administered 2011-04-16: 1 mg via ORAL
  Filled 2011-04-15 (×3): qty 1

## 2011-04-15 MED ORDER — COLESEVELAM HCL 625 MG PO TABS
3750.0000 mg | ORAL_TABLET | Freq: Every day | ORAL | Status: DC
  Administered 2011-04-16 – 2011-04-19 (×4): 3750 mg via ORAL
  Filled 2011-04-15 (×5): qty 6

## 2011-04-15 MED ORDER — LIDOCAINE HCL (PF) 1 % IJ SOLN
5.0000 mL | INTRAMUSCULAR | Status: DC | PRN
  Filled 2011-04-15: qty 5

## 2011-04-15 MED ORDER — PREDNISONE 10 MG PO TABS
10.0000 mg | ORAL_TABLET | Freq: Every day | ORAL | Status: DC
  Administered 2011-04-16: 10 mg via ORAL
  Filled 2011-04-15 (×2): qty 1

## 2011-04-15 MED ORDER — ISOSORBIDE DINITRATE 20 MG PO TABS
40.0000 mg | ORAL_TABLET | Freq: Two times a day (BID) | ORAL | Status: DC
  Administered 2011-04-16 – 2011-04-19 (×8): 40 mg via ORAL
  Filled 2011-04-15 (×10): qty 2

## 2011-04-15 MED ORDER — PARICALCITOL 1 MCG PO CAPS
1.0000 ug | ORAL_CAPSULE | ORAL | Status: DC
  Filled 2011-04-15: qty 1

## 2011-04-15 MED ORDER — ASPIRIN 81 MG PO CHEW: 81.0000 mg | CHEWABLE_TABLET | Freq: Every day | ORAL | Status: DC

## 2011-04-15 MED ORDER — DOXAZOSIN MESYLATE 8 MG PO TABS
8.0000 mg | ORAL_TABLET | Freq: Two times a day (BID) | ORAL | Status: DC
  Administered 2011-04-16 – 2011-04-19 (×8): 8 mg via ORAL
  Filled 2011-04-15 (×9): qty 1

## 2011-04-15 MED ORDER — DARBEPOETIN ALFA-POLYSORBATE 100 MCG/0.5ML IJ SOLN
100.0000 ug | INTRAMUSCULAR | Status: DC
  Administered 2011-04-15: 100 ug via INTRAVENOUS
  Filled 2011-04-15: qty 0.5

## 2011-04-15 MED ORDER — SODIUM CHLORIDE 0.9 % IV SOLN
100.0000 mL | INTRAVENOUS | Status: DC | PRN
  Administered 2011-04-16: 100 mL via INTRAVENOUS

## 2011-04-15 MED ORDER — ROSUVASTATIN CALCIUM 40 MG PO TABS
40.0000 mg | ORAL_TABLET | Freq: Every day | ORAL | Status: DC
  Administered 2011-04-16: 40 mg via ORAL
  Filled 2011-04-15 (×2): qty 1

## 2011-04-15 MED ORDER — SIMVASTATIN 20 MG PO TABS
20.0000 mg | ORAL_TABLET | Freq: Every day | ORAL | Status: DC
  Administered 2011-04-16: 20 mg via ORAL
  Filled 2011-04-15 (×2): qty 1

## 2011-04-15 MED ORDER — LIDOCAINE-PRILOCAINE 2.5-2.5 % EX CREA: 1.0000 "application " | TOPICAL_CREAM | CUTANEOUS | Status: DC | PRN

## 2011-04-15 MED ORDER — CLONIDINE HCL 0.1 MG PO TABS
0.1000 mg | ORAL_TABLET | Freq: Three times a day (TID) | ORAL | Status: DC | PRN
  Filled 2011-04-15: qty 1

## 2011-04-15 MED ORDER — PARICALCITOL 5 MCG/ML IV SOLN
1.0000 ug | INTRAVENOUS | Status: DC
  Administered 2011-04-15 – 2011-04-19 (×3): 1 ug via INTRAVENOUS
  Filled 2011-04-15 (×3): qty 0.2

## 2011-04-15 MED ORDER — PENTAFLUOROPROP-TETRAFLUOROETH EX AERO
1.0000 "application " | INHALATION_SPRAY | CUTANEOUS | Status: DC | PRN
  Filled 2011-04-15: qty 103.5

## 2011-04-15 MED ORDER — PARICALCITOL 1 MCG PO CAPS: 1.0000 ug | ORAL_CAPSULE | ORAL | Status: DC

## 2011-04-15 MED ORDER — ASPIRIN EC 81 MG PO TBEC
81.0000 mg | DELAYED_RELEASE_TABLET | Freq: Every day | ORAL | Status: DC
  Administered 2011-04-16 – 2011-04-19 (×5): 81 mg via ORAL
  Filled 2011-04-15 (×5): qty 1

## 2011-04-15 MED ORDER — SODIUM BICARBONATE 650 MG PO TABS
1300.0000 mg | ORAL_TABLET | Freq: Two times a day (BID) | ORAL | Status: DC
  Administered 2011-04-16 (×3): 1300 mg via ORAL
  Filled 2011-04-15 (×6): qty 2

## 2011-04-15 MED ORDER — SODIUM CHLORIDE 0.9 % IV SOLN: 100.0000 mL | INTRAVENOUS | Status: DC | PRN

## 2011-04-15 MED ORDER — DARBEPOETIN ALFA-POLYSORBATE 100 MCG/0.5ML IJ SOLN
INTRAMUSCULAR | Status: AC
  Administered 2011-04-15: 100 ug via INTRAVENOUS
  Filled 2011-04-15: qty 0.5

## 2011-04-15 MED ORDER — INSULIN GLARGINE 100 UNIT/ML ~~LOC~~ SOLN
25.0000 [IU] | Freq: Every day | SUBCUTANEOUS | Status: DC
  Administered 2011-04-16 – 2011-04-19 (×4): 25 [IU] via SUBCUTANEOUS
  Filled 2011-04-15 (×2): qty 3

## 2011-04-15 NOTE — ED Notes (Signed)
Attempt to call report x2  

## 2011-04-15 NOTE — ED Notes (Signed)
[name] sent pt. To [place] for admission by the Hospitalist due to pt.s kidney failure,  Pt. Will need to begin dialysis. [name] would like pt. To be seen by [place] first pt. Denies any pain or discomfort. Pt. Reports that he is retaining fluid

## 2011-04-15 NOTE — ED Notes (Signed)
Patient had a kidney transplant years ago. Prior to that he was on dialysis and had 2 different fistulas that went bad on his left arm. He currently has a fistula to the right lower forearm which is positive for thrill/bruit. Pt states that for the past couple of days he has been having increasing generalized swelling and 2 plus pitting edema to the left upper arm. Breath sounds are clear and bowel sounds are present. Iv started and labs obtained. Pt is supposed to be seen by nephrology for possible initiation of dialysis once again. Alert and oriented. Nursing to continue to monitor.

## 2011-04-15 NOTE — ED Provider Notes (Addendum)
History     CSN: [id number] Arrival date & time: 04/15/2011 10:37 AM   First MD Initiated Contact with Patient 04/15/11 1041      Chief Complaint  Patient presents with  . Acute Renal Failure   chief complaint weak  (Consider location/radiation/quality/duration/timing/severity/associated sxs/prior treatment) HPI Complains of generalized weakness for several weeks with worsening peripheral edema in arms and leg patient sent here presumably to get dialysis treatment. Denies pain anywhere. Nothing makes symptoms better or worse. No Shortness of breath no chest pain. Past Medical History  Diagnosis Date  . Renal failure   . Myocardial infarction   . Diabetes mellitus   . Hypertension     Past Surgical History  Procedure Date  . Nephrectomy transplanted organ   . Av fistula repair     rt. a=fore arm fistula    No family history on file.  History  Substance Use Topics  . Smoking status: Former Smoker    Quit date: 05/27/1988  . Smokeless tobacco: Not on file  . Alcohol Use: No      Review of Systems  Constitutional: Positive for fatigue.  HENT: Negative.   Respiratory: Negative.   Cardiovascular: Positive for leg swelling.  Gastrointestinal: Negative.   Musculoskeletal: Negative.   Skin: Negative.   Neurological: Negative.   Hematological: Negative.   Psychiatric/Behavioral: Negative.   All other systems reviewed and are negative.    Allergies  Lisinopril; Norvasc; and Penicillins  Home Medications   Current Outpatient Rx  Name Route Sig Dispense Refill  . ASPIRIN EC 81 MG PO TBEC Oral Take 81 mg by mouth daily.      . ATORVASTATIN CALCIUM 40 MG PO TABS Oral Take 40 mg by mouth daily.      . AZATHIOPRINE 50 MG PO TABS Oral Take 75 mg by mouth daily.      Marland Kitchen VITAMIN D PO Oral Take 1 capsule by mouth daily.      . COLESEVELAM HCL 625 MG PO TABS Oral Take 3,750 mg by mouth daily. Takes 6 tabs daily    . DOXAZOSIN MESYLATE 8 MG PO TABS Oral Take 8 mg by  mouth 2 (two) times daily.      Marland Kitchen FAMOTIDINE 20 MG PO TABS Oral Take 20 mg by mouth daily.      Marland Kitchen HYDRALAZINE HCL 25 MG PO TABS Oral Take 75 mg by mouth 3 (three) times daily.      . ISOSORBIDE DINITRATE 40 MG PO TABS Oral Take 40 mg by mouth 3 (three) times daily.      Marland Kitchen PARICALCITOL 1 MCG PO CAPS Oral Take 1 mcg by mouth See admin instructions. Takes on Mon, Wed & Fri     . PREDNISONE 10 MG PO TABS Oral Take 10 mg by mouth daily.      . SODIUM BICARBONATE 650 MG PO TABS Oral Take 2 tablets (1,300 mg total) by mouth 2 (two) times daily. 30 tablet 0  . TACROLIMUS 1 MG PO CAPS Oral Take 1 mg by mouth 2 (two) times daily.      . TORSEMIDE 100 MG PO TABS Oral Take 1 tablet (100 mg total) by mouth 3 (three) times daily. 30 tablet 0  . WARFARIN SODIUM 2.5 MG PO TABS Oral Take 2.5 mg by mouth daily.       BP 175/83  Pulse 73  Temp(Src) 97.7 F (36.5 C) (Oral)  Resp 20  Ht 6' (1.829 m)  SpO2 97%  Physical Exam  Constitutional: He appears well-developed and well-nourished.  HENT:  Head: Normocephalic and atraumatic.  Eyes: Pupils are equal, round, and reactive to light.       Small cell conjunctival hemorrhage right eye  Neck: Neck supple. No tracheal deviation present. No thyromegaly present.  Cardiovascular: Normal rate and regular rhythm.   No murmur heard. Pulmonary/Chest: Effort normal and breath sounds normal.  Abdominal: Soft. Bowel sounds are normal. He exhibits no distension. There is no tenderness.  Musculoskeletal: Normal range of motion. He exhibits no edema and no tenderness.       Dialysis fistula right upper extremity with good thrill .all 4 extremities edematous  Neurological: He is alert. Coordination normal.  Skin: Skin is warm and dry. No rash noted.  Psychiatric: He has a normal mood and affect.    ED Course  Procedures (including critical care time)  Labs Reviewed - No data to display No results found.   No diagnosis found.  Spoke with Dr.Powell who states  the patient is to be admitted by [facility] hospitalist for further evaluation . They're to consult [facility] for hemodialysis Spoke with [facility] hospitalist plan admit telemetry MDM  Plan admit. Withhold Coumadin Diagnosis renal failure #2 weakness #3 peripheral edema  #4 Coumadin toxicity      Doug Sou, MD 04/15/11 1155  Doug Sou, MD 04/15/11 1330  Doug Sou, MD 04/15/11 [phone]

## 2011-04-15 NOTE — Consult Note (Addendum)
Reason for Consult:Renal failure Referring Physician: [name]    HPI: 65 year old male with a history of heart transplant and renal transplant followed by [name] referred to ER today due to worsening appetite, malaise, and difficulty sleeping. In addition patient has generalized weakness and fatigue going on for past few weeks. Patient saw [name] today and he recommended hospital admission for possible dialysis. Patient had renal transplant and his creatinine has stayed around 4.2 for the past 6 months. He has noticed gradual swelling of both lower extremities as well left upper extremity. He has AV fistula in both the arms. The right arm fistula has positive thrill/bruit and was placed earlier this year. Patient denies any nausea, vomiting, no shortness of breath no chest pain. Patient has been followed at [facility] for transplant management.  He recently was diagnosed with left lower extremity DVT and has been on Coumadin.   Past Medical History  Diagnosis Date  . Renal failure   . Diabetes mellitus   . Hypertension   . Myocardial infarction 1985; 1990  . Angina   . Coronary artery disease   . Dysrhythmia   . CHF (congestive heart failure)   . DVT (deep venous thrombosis) ~ 12/2010    LLE  . Anemia   . Blood transfusion 06/1999    post heart transplant    Past Surgical History  Procedure Date  . Nephrectomy transplanted organ   . Av fistula repair     rt. a=fore arm fistula  . Av fistula placement ~ 01/2010    "this was my 2nd fistula placement"  . Heart transplant 06/1999  . Coronary angioplasty with stent placement 1985; 1990  . Coronary artery bypass graft 1990    CABG X3    History reviewed. No pertinent family history.  Social History:  reports that he quit smoking about 22 years ago. His smoking use included Cigarettes. He has a 20 pack-year smoking history. He has never used smokeless tobacco. He reports that he drinks alcohol. He reports that he does not  use illicit drugs.  Allergies:  Allergies  Allergen Reactions  . Lisinopril Swelling    Lips and tongue swell  . Norvasc (Amlodipine Besylate) Rash    Flushing  . Penicillins Rash    Medications: I have reviewed the patient's current medications.  Results for orders placed during the hospital encounter of 04/15/11 (from the past 48 hour(s))  BASIC METABOLIC PANEL     Status: Abnormal   Collection Time   04/15/11 11:12 AM      Component Value Range Comment   Sodium 146 (*) 135 - 145 (mEq/L)    Potassium 4.2  3.5 - 5.1 (mEq/L)    Chloride 115 (*) 96 - 112 (mEq/L)    CO2 17 (*) 19 - 32 (mEq/L)    Glucose, Bld 90  70 - 99 (mg/dL)    BUN 80 (*) 6 - 23 (mg/dL)    Creatinine, Ser 1.61 (*) 0.50 - 1.35 (mg/dL)    Calcium 8.6  8.4 - 10.5 (mg/dL)    GFR calc non Af Amer 12 (*) >90 (mL/min)    GFR calc Af Amer 14 (*) >90 (mL/min)   CBC     Status: Abnormal   Collection Time   04/15/11 11:12 AM      Component Value Range Comment   WBC 3.6 (*) 4.0 - 10.5 (K/uL)    RBC 2.94 (*) 4.22 - 5.81 (MIL/uL)    Hemoglobin 9.4 (*) 13.0 -  17.0 (g/dL)    HCT 16.1 (*) 09.6 - 52.0 (%)    MCV 99.7  78.0 - 100.0 (fL)    MCH 32.0  26.0 - 34.0 (pg)    MCHC 32.1  30.0 - 36.0 (g/dL)    RDW 04.5 (*) 40.9 - 15.5 (%)    Platelets 135 (*) 150 - 400 (K/uL)   PROTIME-INR     Status: Abnormal   Collection Time   04/15/11 11:12 AM      Component Value Range Comment   Prothrombin Time 44.5 (*) 11.6 - 15.2 (seconds)    INR 4.65 (*) 0.00 - 1.49      Dg Chest 2 View  04/15/2011  *RADIOLOGY REPORT*  Clinical Data: Increasing weakness and  fatigue.  Crackles of the lung bases.  History of heart and renal transplants.  Lower extremity swelling.  CHEST - 2 VIEW  Comparison: 04/11/2011  Findings: Chronic cardiomegaly.  Pulmonary vascular congestion. Interstitial edema has improved.  Tiny bilateral effusions. Increased fluid along the fissures on the right as well as fluid loculated laterally at the right base.   IMPRESSION: Improved pulmonary edema.  However, the patient now has loculated fluid at the right base laterally and along the fissures.  Original Report Authenticated By: [name], M.D.    Review of Systems  Constitutional: Negative.   HENT: Negative.   Eyes: Negative.   Respiratory: Positive for shortness of breath.   Gastrointestinal: Negative.        Dark stools without melana  Genitourinary: Negative.   Musculoskeletal: Negative.   Skin: Negative.   Neurological: Negative.   Psychiatric/Behavioral: Negative.    Blood pressure 150/66, pulse 84, temperature 98.4 F (36.9 C), temperature source Oral, resp. rate 18, height 6' (1.829 m), SpO2 91.00%. Physical Exam  Constitutional: He is oriented to person, place, and time. He appears well-developed and well-nourished.  HENT:  Head: Normocephalic.  Eyes: Conjunctivae and EOM are normal. Pupils are equal, round, and reactive to light.  Neck: Normal range of motion.  Cardiovascular: Normal rate.        No rub  Respiratory: Effort normal. He has rales.       1/3 down bilateral  GI: Soft.  Musculoskeletal: He exhibits edema (2+).  Neurological: He is alert and oriented to person, place, and time.  Skin: Skin is warm.  Psychiatric: His behavior is normal.    Assessment: Uremic syndrome S/P Failed renal Transplant S/P Orthotopic KidneryTransplant Anemia Volume Overload DVT Coagulopathy Plan: Hemodialysis tonight, no heparin  Begin erythropoeitin  Manley Fason C 04/15/2011, 5:17 PM

## 2011-04-15 NOTE — H&P (Signed)
PCP:   Garnetta Buddy, MD, MD   Chief Complaint:  Generalized weakness  HPI: 65 year old male with a history of heart transplant, renal transplant, hypertension, diabetes mellitus who came to the hospital for worsening generalized weakness and fatigue going on for past few weeks. Patient says that today he visited [name] the nephrologist, at his office and he recommended hospital admission for possible dialysis. Patient had renal transplant and his creatinine has stayed around 4.2 for the past 6 months. He has noticed gradual swelling of both lower extremities as well left upper extremity. He has AV fistula in both the arms. The right arm fistula has positive thrill/bruit. Patient denies any nausea vomiting no shortness of breath no chest pain. He recently was diagnosed with left lower extremity DVT and has been on Coumadin.  Allergies:   Allergies  Allergen Reactions  . Lisinopril Swelling    Lips and tongue swell  . Norvasc (Amlodipine Besylate) Rash    Flushing  . Penicillins Rash      Past Medical History  Diagnosis Date  . Renal failure   . Myocardial infarction   . Diabetes mellitus   . Hypertension     Past Surgical History  Procedure Date  . Nephrectomy transplanted organ   . Av fistula repair     rt. a=fore arm fistula    Prior to Admission medications   Medication Sig Start Date End Date Taking? Authorizing Provider  aspirin EC 81 MG tablet Take 81 mg by mouth daily.     Yes Historical Provider, MD  atorvastatin (LIPITOR) 40 MG tablet Take 40 mg by mouth daily.     Yes Historical Provider, MD  azaTHIOprine (IMURAN) 50 MG tablet Take 75 mg by mouth daily.     Yes Historical Provider, MD  Cholecalciferol (VITAMIN D PO) Take 1 capsule by mouth daily.     Yes Historical Provider, MD  colesevelam (WELCHOL) 625 MG tablet Take 3,750 mg by mouth daily. Takes 6 tabs daily   Yes Historical Provider, MD  doxazosin (CARDURA) 8 MG tablet Take 8 mg by mouth 2 (two) times  daily.     Yes Historical Provider, MD  famotidine (PEPCID) 20 MG tablet Take 20 mg by mouth daily.     Yes Historical Provider, MD  hydrALAZINE (APRESOLINE) 25 MG tablet Take 75 mg by mouth 3 (three) times daily.     Yes Historical Provider, MD  isosorbide dinitrate (ISORDIL) 40 MG tablet Take 40 mg by mouth 3 (three) times daily.     Yes Historical Provider, MD  paricalcitol (ZEMPLAR) 1 MCG capsule Take 1 mcg by mouth See admin instructions. Takes on Mon, Wed & Fri    Yes Historical Provider, MD  predniSONE (DELTASONE) 10 MG tablet Take 10 mg by mouth daily.     Yes Historical Provider, MD  sodium bicarbonate 650 MG tablet Take 2 tablets (1,300 mg total) by mouth 2 (two) times daily. 04/11/11  Yes Celene Kras, MD  tacrolimus (PROGRAF) 1 MG capsule Take 1 mg by mouth 2 (two) times daily.     Yes Historical Provider, MD  torsemide (DEMADEX) 100 MG tablet Take 1 tablet (100 mg total) by mouth 3 (three) times daily. 04/11/11  Yes Celene Kras, MD  warfarin (COUMADIN) 2.5 MG tablet Take 2.5 mg by mouth daily.    Yes Historical Provider, MD    Social History:  reports that he quit smoking about 22 years ago. He does not have any smokeless tobacco  history on file. He reports that he does not drink alcohol or use illicit drugs.   Review of Systems:  Constitutional: Denies fever, chills, diaphoresis, appetite change and fatigue.  HEENT: Denies photophobia,  hearing loss, ear pain, congestion, sore throat, rhinorrhea, sneezing, mouth sores. Respiratory: Denies SOB, DOE, cough, chest tightness,  and wheezing.   Cardiovascular: Denies chest pain Gastrointestinal: Denies nausea, vomiting, abdominal pain, diarrhea, constipation, blood in stool and abdominal distention.  Genitourinary: Denies dysuria, urgency, frequency, Skin: Denies pallor, rash and wound.  Neurological: Denies dizziness, seizures, syncope, weakness, light-headedness, numbness and headaches.  Hematological: Patient on  coumadin  Physical Exam: Blood pressure 181/75, pulse 77, temperature 97.5 F (36.4 C), temperature source Oral, resp. rate 22, height 6' (1.829 m), SpO2 92.00%. Constitutional: Vital signs reviewed.  Patient is a well-developed, in no acute distress and cooperative with exam. Alert and oriented x3.  Head: Normocephalic and atraumatic Mouth: no erythema or exudates, MMM Eyes: PERRL, EOMI, Rt subconjunctival hemorrhage, No scleral icterus.  Neck: Supple, Trachea midline normal ROM, Cardiovascular: RRR, S1 normal, S2 normal, no MRG, pulses symmetric and intact bilaterally Pulmonary/Chest: Bilateral crackles Abdominal: Soft. Non-tender, non-distended, bowel sounds are normal, no masses, organomegaly, or guarding present.  GU: no CVA tenderness Musculoskeletal: No joint deformities, erythema, or stiffness, ROM full and no nontender.  Neurological: A&O x3, Strenght is normal and symmetric bilaterally, cranial nerve II-XII are grossly intact, no focal motor deficit, sensory intact to light touch bilaterally.  Extremities: 2+ edema in left lower extremity, and left upper extremity, 1+ edema in right lower extremity.  Psychiatric: Normal mood and affect. speech and behavior is normal. Judgment and thought content normal. Cognition and memory are normal.    Labs on Admission:  Results for orders placed during the hospital encounter of 04/15/11 (from the past 48 hour(s))  BASIC METABOLIC PANEL     Status: Abnormal   Collection Time   04/15/11 11:12 AM      Component Value Range Comment   Sodium 146 (*) 135 - 145 (mEq/L)    Potassium 4.2  3.5 - 5.1 (mEq/L)    Chloride 115 (*) 96 - 112 (mEq/L)    CO2 17 (*) 19 - 32 (mEq/L)    Glucose, Bld 90  70 - 99 (mg/dL)    BUN 80 (*) 6 - 23 (mg/dL)    Creatinine, Ser 9.14 (*) 0.50 - 1.35 (mg/dL)    Calcium 8.6  8.4 - 10.5 (mg/dL)    GFR calc non Af Amer 12 (*) >90 (mL/min)    GFR calc Af Amer 14 (*) >90 (mL/min)   CBC     Status: Abnormal   Collection  Time   04/15/11 11:12 AM      Component Value Range Comment   WBC 3.6 (*) 4.0 - 10.5 (K/uL)    RBC 2.94 (*) 4.22 - 5.81 (MIL/uL)    Hemoglobin 9.4 (*) 13.0 - 17.0 (g/dL)    HCT 78.2 (*) 95.6 - 52.0 (%)    MCV 99.7  78.0 - 100.0 (fL)    MCH 32.0  26.0 - 34.0 (pg)    MCHC 32.1  30.0 - 36.0 (g/dL)    RDW 21.3 (*) 08.6 - 15.5 (%)    Platelets 135 (*) 150 - 400 (K/uL)   PROTIME-INR     Status: Abnormal   Collection Time   04/15/11 11:12 AM      Component Value Range Comment   Prothrombin Time 44.5 (*) 11.6 - 15.2 (seconds)  INR 4.65 (*) 0.00 - 1.49        Assessment/Plan Active Problems:  Chronic kidney disease (CKD), stage IV (severe) Patient has C KD, and normal require hemodialysis due to fluid overload. Patient will be started on torsemide at his usual home dose. I have consulted Dr. Lowell Guitar, who will see the patient and adjust medications as needed. Patient is still making urine.  HTN (hypertension) Patient will be started on hydralazine and Cardura, I will also add clonidine 0.1 mg by mouth Q8 hours when necessary.  DVT (deep venous thrombosis) Patient has a recent diagnosis of DVT and has been on Coumadin  due to coagulopathy, I will hold the Coumadin at this time.  Coagulopathy Will hold the Coumadin at this time. Pharmacy will adjust the patient's PT/INR.  Heart transplanted Will obtain a 2-D echocardiogram as patient has a history of heart transplant and now he has generalized swelling.  Renal transplant failure and rejection Will continue the patient's medication for renal transplant including Imuran and prednisone. Further management as per nephrology. Diabetes mellitus We'll start the patient on sliding-scale insulin.   Time Spent on Admission: 50 minutes  Samaj Wessells S 04/15/2011, 2:27 PM

## 2011-04-15 NOTE — ED Notes (Signed)
Dr Lowell Guitar at bedside

## 2011-04-15 NOTE — ED Notes (Signed)
Patient states he has been experiencing increased weakness over the past few weeks. States was here in the ed a week ago and was referred to his nephrologist. States he saw them this morning and his renal function is poor so they sent him here to be admitted for dialysis. States the kidneys he has now are donated and they have not functioned "quite right" since the transplant. Denies pain . Room temp adjusted. Pt has finished eating lunch. Consult doctor has been in to do admission evaluation

## 2011-04-15 NOTE — Progress Notes (Signed)
ANTICOAGULATION CONSULT NOTE - Initial Consult  Pharmacy Consult for Coumadin Indication: subacute LLE DVT  Allergies  Allergen Reactions  . Lisinopril Swelling    Lips and tongue swell  . Norvasc (Amlodipine Besylate) Rash    Flushing  . Penicillins Rash    Patient Measurements: Height: 6' (182.9 cm) IBW/kg (Calculated) : 77.6    Vital Signs: Temp: 97.9 F (36.6 C) (11/19 1815) Temp src: Oral (11/19 1815) BP: 195/96 mmHg (11/19 1815) Pulse Rate: 82  (11/19 1815)  Labs:  Basename 04/15/11 1112  HGB 9.4*  HCT 29.3*  PLT 135*  APTT --  LABPROT 44.5*  INR 4.65*  HEPARINUNFRC --  CREATININE 4.68*  CKTOTAL --  CKMB --  TROPONINI --   The CrCl is unknown because both a height and weight (above a minimum accepted value) are required for this calculation.  Medical History: Past Medical History  Diagnosis Date  . Renal failure   . Diabetes mellitus   . Hypertension   . Myocardial infarction 1985; 1990  . Angina   . Coronary artery disease   . Dysrhythmia   . CHF (congestive heart failure)   . DVT (deep venous thrombosis) ~ 12/2010    LLE  . Anemia   . Blood transfusion 06/1999    post heart transplant    Medications:  Prescriptions prior to admission  Medication Sig Dispense Refill  . aspirin EC 81 MG tablet Take 81 mg by mouth daily.        Marland Kitchen atorvastatin (LIPITOR) 40 MG tablet Take 40 mg by mouth daily.        Marland Kitchen azaTHIOprine (IMURAN) 50 MG tablet Take 75 mg by mouth daily.        . Cholecalciferol (VITAMIN D PO) Take 1 capsule by mouth daily.        . colesevelam (WELCHOL) 625 MG tablet Take 3,750 mg by mouth daily. Takes 6 tabs daily      . doxazosin (CARDURA) 8 MG tablet Take 8 mg by mouth 2 (two) times daily.        . famotidine (PEPCID) 20 MG tablet Take 20 mg by mouth daily.        . hydrALAZINE (APRESOLINE) 25 MG tablet Take 75 mg by mouth 3 (three) times daily.        . isosorbide dinitrate (ISORDIL) 40 MG tablet Take 40 mg by mouth 3 (three)  times daily.        . paricalcitol (ZEMPLAR) 1 MCG capsule Take 1 mcg by mouth See admin instructions. Takes on Mon, Wed & Fri       . predniSONE (DELTASONE) 10 MG tablet Take 10 mg by mouth daily.        . sodium bicarbonate 650 MG tablet Take 2 tablets (1,300 mg total) by mouth 2 (two) times daily.  30 tablet  0  . tacrolimus (PROGRAF) 1 MG capsule Take 1 mg by mouth 2 (two) times daily.        Marland Kitchen torsemide (DEMADEX) 100 MG tablet Take 1 tablet (100 mg total) by mouth 3 (three) times daily.  30 tablet  0  . warfarin (COUMADIN) 2.5 MG tablet Take 2.5 mg by mouth daily.         Assessment: 65 y/o male patient on coumadin for h/o subacute LLE DVT. INR currently supratherapeutic, no bleeding reported.  Goal of Therapy:  INR 2-3   Plan:  No Coumadin today. F/u daily protime.  Verlene Mayer, PharmD, BCPS Pager [phone]  04/15/2011,7:15 PM

## 2011-04-16 ENCOUNTER — Inpatient Hospital Stay (HOSPITAL_COMMUNITY): Payer: Medicare Other

## 2011-04-16 DIAGNOSIS — N185 Chronic kidney disease, stage 5: Secondary | ICD-10-CM | POA: Diagnosis present

## 2011-04-16 DIAGNOSIS — R509 Fever, unspecified: Secondary | ICD-10-CM | POA: Clinically undetermined

## 2011-04-16 DIAGNOSIS — E1129 Type 2 diabetes mellitus with other diabetic kidney complication: Secondary | ICD-10-CM | POA: Diagnosis present

## 2011-04-16 DIAGNOSIS — I10 Essential (primary) hypertension: Secondary | ICD-10-CM | POA: Diagnosis present

## 2011-04-16 LAB — GLUCOSE, CAPILLARY
Glucose-Capillary: 128 mg/dL — ABNORMAL HIGH (ref 70–99)
Glucose-Capillary: 177 mg/dL — ABNORMAL HIGH (ref 70–99)
Glucose-Capillary: 109 mg/dL — ABNORMAL HIGH (ref 70–99)

## 2011-04-16 LAB — CBC
HCT: 28.5 % — ABNORMAL LOW (ref 39.0–52.0)
Hemoglobin: 9.1 g/dL — ABNORMAL LOW (ref 13.0–17.0)
MCH: 31.3 pg (ref 26.0–34.0)
MCHC: 31.9 g/dL (ref 30.0–36.0)
RDW: 17.1 % — ABNORMAL HIGH (ref 11.5–15.5)

## 2011-04-16 LAB — RENAL FUNCTION PANEL
BUN: 49 mg/dL — ABNORMAL HIGH (ref 6–23)
Calcium: 8.3 mg/dL — ABNORMAL LOW (ref 8.4–10.5)
Creatinine, Ser: 3.6 mg/dL — ABNORMAL HIGH (ref 0.50–1.35)
Glucose, Bld: 162 mg/dL — ABNORMAL HIGH (ref 70–99)
Phosphorus: 3.1 mg/dL (ref 2.3–4.6)

## 2011-04-16 LAB — HEPATITIS B SURFACE ANTIGEN: Hepatitis B Surface Ag: NEGATIVE

## 2011-04-16 LAB — HEPATITIS B CORE ANTIBODY, TOTAL: Hep B Core Total Ab: NEGATIVE

## 2011-04-16 MED ORDER — HYDRALAZINE HCL 25 MG PO TABS
25.0000 mg | ORAL_TABLET | Freq: Three times a day (TID) | ORAL | Status: DC
  Administered 2011-04-16 – 2011-04-18 (×6): 25 mg via ORAL
  Filled 2011-04-16 (×11): qty 1

## 2011-04-16 MED ORDER — ATORVASTATIN CALCIUM 40 MG PO TABS
40.0000 mg | ORAL_TABLET | Freq: Every day | ORAL | Status: DC
  Administered 2011-04-16 – 2011-04-18 (×3): 40 mg via ORAL
  Filled 2011-04-16 (×4): qty 1

## 2011-04-16 MED ORDER — DEXTROSE 50 % IV SOLN
INTRAVENOUS | Status: AC
  Administered 2011-04-16: 25 mL via INTRAVENOUS
  Filled 2011-04-16: qty 50

## 2011-04-16 NOTE — Progress Notes (Signed)
  Echocardiogram 2D Echocardiogram has been performed.  Brandon Sharp 04/16/2011, 11:24 AM

## 2011-04-16 NOTE — Progress Notes (Addendum)
Subjective: Feeling well today.  Breathing is improved.  No specific complaints.  Objective: Vital signs in last 24 hours: Filed Vitals:   04/16/11 0513 04/16/11 0926 04/16/11 0956 04/16/11 1400  BP: 172/76 167/74 195/76 170/82  Pulse: 96 98 89 84  Temp: 101.6 F (38.7 C)  98.6 F (37 C) 97.9 F (36.6 C)  TempSrc: Oral  Oral Oral  Resp: 19  20 20   Height:      Weight:      SpO2: 92%  93% 94%    Weight change:   Intake/Output Summary (Last 24 hours) at 04/16/11 1448 Last data filed at 04/16/11 1300  Gross per 24 hour  Intake    750 ml  Output   2512 ml  Net  -1762 ml   Physical Exam: General: Awake, Oriented, No acute distress. HEENT: EOMI. Neck: Supple CV: S1 and S2 Lungs: Clear to ascultation bilaterally Abdomen: Soft, Nontender, Nondistended, +bowel sounds. Ext: Good pulses. 2+ LE edema.  Lab Results:  [facility] 04/16/11 0920 04/15/11 1112  NA 141 146*  K 3.6 4.2  CL 109 115*  CO2 20 17*  GLUCOSE 162* 90  BUN 49* 80*  CREATININE 3.60* 4.68*  CALCIUM 8.3* 8.6  MG -- --  PHOS 3.1 --    Basename 04/16/11 0920 04/15/11 2001  AST -- --  ALT -- 9  ALKPHOS -- --  BILITOT -- --  PROT -- --  ALBUMIN 2.4* --   No results found for this basename: LIPASE:2,AMYLASE:2 in the last 72 hours  Basename 04/16/11 0920 04/15/11 1112  WBC 4.3 3.6*  NEUTROABS -- --  HGB 9.1* 9.4*  HCT 28.5* 29.3*  MCV 97.9 99.7  PLT 105* 135*   No results found for this basename: CKTOTAL:3,CKMB:3,CKMBINDEX:3,TROPONINI:3 in the last 72 hours No results found for this basename: POCBNP:3 in the last 72 hours No results found for this basename: DDIMER:2 in the last 72 hours No results found for this basename: HGBA1C:2 in the last 72 hours No results found for this basename: CHOL:2,HDL:2,LDLCALC:2,TRIG:2,CHOLHDL:2,LDLDIRECT:2 in the last 72 hours No results found for this basename: TSH,T4TOTAL,FREET3,T3FREE,THYROIDAB in the last 72 hours  Basename 04/15/11 2001  VITAMINB12 --    FOLATE --  FERRITIN --  TIBC 196*  IRON 34*  RETICCTPCT --    Micro Results: No results found for this or any previous visit (from the past 240 hour(s)).  Studies/Results: Dg Chest 2 View  04/15/2011  *RADIOLOGY REPORT*  Clinical Data: Increasing weakness and  fatigue.  Crackles of the lung bases.  History of heart and renal transplants.  Lower extremity swelling.  CHEST - 2 VIEW  Comparison: 04/11/2011  Findings: Chronic cardiomegaly.  Pulmonary vascular congestion. Interstitial edema has improved.  Tiny bilateral effusions. Increased fluid along the fissures on the right as well as fluid loculated laterally at the right base.  IMPRESSION: Improved pulmonary edema.  However, the patient now has loculated fluid at the right base laterally and along the fissures.  Original Report Authenticated By: [name], M.D.   Dg Chest 2 View  04/11/2011  *RADIOLOGY REPORT*  Clinical Data: Weakness, shortness of breath  CHEST - 2 VIEW  Comparison: Portable chest x-ray of 07/19/2010  Findings: There are prominent interstitial markings with prominent pulmonary vascularity most consistent with interstitial edema in this patient with COPD.  Moderate cardiomegaly is present.  Median sternotomy sutures are noted from prior CABG.  No acute bony abnormality is seen.  IMPRESSION:   Probable interstitial edema in patient  with COPD.  Cardiomegaly.  Original Report Authenticated By: Juline Patch, M.D.    Medications: I have reviewed the patient's current medications. Scheduled Meds:   . aspirin EC  81 mg Oral Daily  . atorvastatin  40 mg Oral q1800  . azaTHIOprine  75 mg Oral Daily  . carvedilol  50 mg Oral BID WC  . colesevelam  3,750 mg Oral Daily  . darbepoetin (ARANESP) injection - DIALYSIS  100 mcg Intravenous Q Mon-HD  . dextrose      . doxazosin  8 mg Oral Q12H  . famotidine  20 mg Oral Daily  . hydrALAZINE  25 mg Oral TID  . influenza  inactive virus vaccine  0.5 mL Intramuscular  Tomorrow-1000  . insulin aspart  0-5 Units Subcutaneous QHS  . insulin aspart  0-9 Units Subcutaneous TID WC  . insulin glargine  25 Units Subcutaneous Daily  . isosorbide dinitrate  40 mg Oral BID  . paricalcitol  1 mcg Intravenous 3 times weekly  . predniSONE  10 mg Oral QAC breakfast  . sodium bicarbonate  1,300 mg Oral BID  . tacrolimus  1 mg Oral BID  . torsemide  100 mg Oral TID  . DISCONTD: aspirin  81 mg Oral Daily  . DISCONTD: azaTHIOprine  75 mg Oral Daily  . DISCONTD: doxazosin  8 mg Oral BID  . DISCONTD: hydrALAZINE  75 mg Oral TID  . DISCONTD: isosorbide dinitrate  40 mg Oral TID  . DISCONTD: paricalcitol  1 mcg Oral Q M,W,F  . DISCONTD: paricalcitol  1 mcg Oral 3 times weekly  . DISCONTD: predniSONE  10 mg Oral Daily  . DISCONTD: rosuvastatin  40 mg Oral QHS  . DISCONTD: simvastatin  20 mg Oral q1800  . DISCONTD: tacrolimus  1 mg Oral BID   Continuous Infusions:  PRN Meds:.sodium chloride, sodium chloride, alteplase, cloNIDine, feeding supplement (NEPRO CARB STEADY), heparin, lidocaine, lidocaine-prilocaine, pentafluoroprop-tetrafluoroeth  Assessment/Plan: 1. 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  Started on dialysis by renal.  2. Status post heart transplant.  Continue home medications.  3. History of DVT.  Continue Coumadin INR therapeutic.  4. Generalized deconditioning.  Physical therapy consultation.  5. Fever, in immunocompromised patient.  Will send for a UA and an a repeat chest x-ray in the morning.  Hold off on starting the patient on antibiotics until infectious workup is completed.  If the patient has another episode of fever low threshold to start antibiotics.  Chest x-ray showed loculated fluid in the right base and along the fissure.  6. Dyslipidemia stable.  Continue the patient on atorvastatin.  7. Uncontrolled hypertension.  Monitor for now.  Patient is on dialysis.  Consider starting the  patient on clonidine scheduled if the patient continues to have elevated blood pressure.  Has an allergy to amlodipine and lisinopril.  8. Type 2 diabetes.  Continue insulin regimen and sliding scale.  9. Prophylaxis.  INR is therapeutic.  10. Disposition pending per renal with hemodialysis and improvement in fever.   LOS: 1 day  Brandon Sharp A, MD 04/16/2011, 2:48 PM  Addendum: 2-D echocardiogram from this admission is pending.  Based on 2-D echocardiogram results on May of 2012 patient had EF of 40-45%.  Patient is status post cardiac transplant.  Likely the patient has acute on chronic systolic heart failure on top of chronic kidney disease stage V (status post renal transplant failure and rejection).  Patient has allergy to ACE  inhibitors.

## 2011-04-16 NOTE — Progress Notes (Signed)
Subjective: Interval History: Dialyzed yesterday 1.2 liters off.  Feels less dyspneic.  .  Objective: Vital signs in last 24 hours: Temp:  [97.3 F (36.3 C)-101.6 F (38.7 C)] 98.6 F (37 C) (11/20 0956) Pulse Rate:  [81-98] 89  (11/20 0956) Resp:  [18-24] 20  (11/20 0956) BP: (139-195)/(66-98) 195/76 mmHg (11/20 0956) SpO2:  [91 %-96 %] 93 % (11/20 0956) Weight:  [79 kg (174 lb 2.6 oz)-79.1 kg (174 lb 6.1 oz)] 174 lb 2.6 oz (79 kg) (11/19 2226) Weight change:   Intake/Output from previous day: 11/19 0701 - 11/20 0700 In: 270 [P.O.:240; I.V.:30] Out: 2511  Intake/Output this shift: Total I/O In: 240 [P.O.:240] Out: 1 [Urine:1]  General appearance: alert Resp: clear to auscultation bilaterally Cardio: regular rate and rhythm, S1, S2 normal, no murmur, click, rub or gallop Extremities: edema left leg edematoue 3+, right leg -  Lab Results:  Basename 04/16/11 0920 04/15/11 1112  WBC 4.3 3.6*  HGB 9.1* 9.4*  HCT 28.5* 29.3*  PLT 105* 135*   BMET:  Basename 04/16/11 0920 04/15/11 1112  NA 141 146*  K 3.6 4.2  CL 109 115*  CO2 20 17*  GLUCOSE 162* 90  BUN 49* 80*  CREATININE 3.60* 4.68*  CALCIUM 8.3* 8.6   No results found for this basename: PTH:2 in the last 72 hours Iron Studies:  Basename 04/15/11 2001  IRON 34*  TIBC 196*  TRANSFERRIN --  FERRITIN --   Studies/Results: Dg Chest 2 View  04/15/2011  *RADIOLOGY REPORT*  Clinical Data: Increasing weakness and  fatigue.  Crackles of the lung bases.  History of heart and renal transplants.  Lower extremity swelling.  CHEST - 2 VIEW  Comparison: 04/11/2011  Findings: Chronic cardiomegaly.  Pulmonary vascular congestion. Interstitial edema has improved.  Tiny bilateral effusions. Increased fluid along the fissures on the right as well as fluid loculated laterally at the right base.  IMPRESSION: Improved pulmonary edema.  However, the patient now has loculated fluid at the right base laterally and along the fissures.   Original Report Authenticated By: [name], M.D.    I have reviewed the patient's current medications.  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

## 2011-04-16 NOTE — Progress Notes (Signed)
04/16/2011 Brandon Sharp [facility] Case Management Note [phone]       Utilization review completed.  

## 2011-04-16 NOTE — Clinical Documentation Improvement (Signed)
CHF DOCUMENTATION CLARIFICATION QUERY                                This QUERY is not a part of the permanent medical record.  TO RESPOND TO THE THIS QUERY, FOLLOW THE INSTRUCTIONS BELOW:  1. If needed, update documentation for the patient's encounter via the notes activity.  2. Access this query again and click edit on the [employer].  3. After updating, or not, click F2 to complete all highlighted (required) fields concerning your review. Select "additional documentation in the medical record" OR "no additional documentation provided". 4. Click Sign note button.  5. The deficiency will fall out of your InBasket *Please let us know if you are not able to compete this workflow by phone or e-mail (listed above).  Please update your documentation within the medical record to reflect your response to this query.                                                                                     04/16/11  Dear Dr. Betti Cruz / Associates,  In a better effort to capture your patient's severity of illness, reflect appropriate length of stay and utilization of resources, a review of the patient medical record has revealed the following indicators the diagnosis of Heart Failure.    Based on your clinical judgment, please clarify and document in a progress note and/or discharge summary the clinical condition associated with the following supporting information:  In responding to this query please exercise your independent judgment.  The fact that a query is asked, does not imply that any particular answer is desired or expected.  Possible Clinical Conditions?  Acute Systolic Congestive Heart Failure Acute Diastolic Congestive Heart Failure Acute Systolic & Diastolic Congestive Heart Failure Acute on Chronic Systolic Congestive Heart Failure Acute on Chronic Diastolic Congestive Heart Failure Acute on Chronic Systolic & Diastolic  Congestive Heart Failure Chronic Systolic Congestive Heart  Failure Chronic Diastolic Congestive Heart Failure Chronic Systolic & Diastolic Congestive Heart FailureOther Condition________________________________________ Cannot Clinically Determine  Supporting Information:  Risk Factors:(As per H&P) pt has "heart transplant " & "kidney transplant", "with failure & rejection"  Signs & Symptoms (As per consult note bu Dr. Lowell Guitar): "Uremic syndrome & Volume Overload"  Diagnostics: Lab: BUN: 04-15-11 = 80 CREATININE; 04-15-11 = 4.68 BNP:04-11-2011 = >70000.0 (H)   Echo results: AWAITING RESULTS OF NEW ECHO ORDERED 09-28-09 Study Conclusions - Left ventricle: The cavity size was normal. Wall thickness was increased in a pattern of moderate LVH. Systolic function was mildly to moderately reduced. The estimated ejection fraction was in the range of 40% to 45%. Diffuse hypokinesis. - Aortic valve: Possibly bicuspid; moderately calcified leaflets. There was mild stenosis. Mild to moderate regurgitation. Valve area: 1.61cm^2(VTI). Valve area: 1.43cm^2 (Vmax). - Mitral valve: Moderate regurgitation. - Left atrium: The atrium was moderately dilated. - Right atrium: The atrium was mildly dilated. Impressions: - Compared to 07/25/03 LV systolic function has improved. Mitral insuffciency and aortic insufficiency are worse.  Radiology:CXR ON 04-11-11 IMPRESSION: Probable interstitial edema in patient with COPD. Cardiomegaly. 04-15-11 IMPRESSION:Improved pulmonary edema. However,  the patient now has loculated fluid at the right base laterally and along the fissures.  Treatment:(AS PER DR POWELL'S NOTE ON 04-15-11) Plan: Hemodialysis tonight    Reviewed: additional documentation in the medical record  Thank You,  Sincerely, Joanette Gula Delk RN, BSN Clinical Documentation Specialist: [phone] Pager  Health Information Management [place]  Clinical Documentation Specialist: Pager    Health Information Management

## 2011-04-16 NOTE — Progress Notes (Signed)
ANTICOAGULATION CONSULT NOTE - Follow Up Consult  Pharmacy Consult for Warfarin Indication: History of recent LLE DVT  Allergies  Allergen Reactions  . Lisinopril Swelling    Lips and tongue swell  . Norvasc (Amlodipine Besylate) Rash    Flushing  . Penicillins Rash    Patient Measurements: Height: 6' (182.9 cm) Weight: 174 lb 2.6 oz (79 kg) IBW/kg (Calculated) : 77.6    Vital Signs: Temp: 98.6 F (37 C) (11/20 0956) Temp src: Oral (11/20 0956) BP: 195/76 mmHg (11/20 0956) Pulse Rate: 89  (11/20 0956)  Labs:  Basename 04/16/11 0920 04/16/11 0615 04/15/11 1112  HGB 9.1* -- 9.4*  HCT 28.5* -- 29.3*  PLT PENDING -- 135*  APTT -- -- --  LABPROT -- 39.1* 44.5*  INR -- 3.94* 4.65*  HEPARINUNFRC -- -- --  CREATININE -- -- 4.68*  CKTOTAL -- -- --  CKMB -- -- --  TROPONINI -- -- --   Estimated Creatinine Clearance: 17 ml/min (by C-G formula based on Cr of 4.68).   Medications:  Prescriptions prior to admission  Medication Sig Dispense Refill  . aspirin EC 81 MG tablet Take 81 mg by mouth daily.        Marland Kitchen atorvastatin (LIPITOR) 40 MG tablet Take 40 mg by mouth daily.        Marland Kitchen azaTHIOprine (IMURAN) 50 MG tablet Take 75 mg by mouth daily.        . Cholecalciferol (VITAMIN D PO) Take 1 capsule by mouth daily.        . colesevelam (WELCHOL) 625 MG tablet Take 3,750 mg by mouth daily. Takes 6 tabs daily      . doxazosin (CARDURA) 8 MG tablet Take 8 mg by mouth 2 (two) times daily.        . famotidine (PEPCID) 20 MG tablet Take 20 mg by mouth daily.        . hydrALAZINE (APRESOLINE) 25 MG tablet Take 75 mg by mouth 3 (three) times daily.        . isosorbide dinitrate (ISORDIL) 40 MG tablet Take 40 mg by mouth 3 (three) times daily.        . paricalcitol (ZEMPLAR) 1 MCG capsule Take 1 mcg by mouth See admin instructions. Takes on Mon, Wed & Fri       . predniSONE (DELTASONE) 10 MG tablet Take 10 mg by mouth daily.        . sodium bicarbonate 650 MG tablet Take 2 tablets  (1,300 mg total) by mouth 2 (two) times daily.  30 tablet  0  . tacrolimus (PROGRAF) 1 MG capsule Take 1 mg by mouth 2 (two) times daily.        Marland Kitchen torsemide (DEMADEX) 100 MG tablet Take 1 tablet (100 mg total) by mouth 3 (three) times daily.  30 tablet  0  . warfarin (COUMADIN) 2.5 MG tablet Take 2.5 mg by mouth daily.        Scheduled:    . aspirin EC  81 mg Oral Daily  . atorvastatin  40 mg Oral q1800  . azaTHIOprine  75 mg Oral Daily  . carvedilol  50 mg Oral BID WC  . colesevelam  3,750 mg Oral Daily  . darbepoetin (ARANESP) injection - DIALYSIS  100 mcg Intravenous Q Mon-HD  . dextrose      . doxazosin  8 mg Oral Q12H  . famotidine  20 mg Oral Daily  . hydrALAZINE  75 mg Oral TID  . influenza  inactive virus vaccine  0.5 mL Intramuscular Tomorrow-1000  . insulin aspart  0-5 Units Subcutaneous QHS  . insulin aspart  0-9 Units Subcutaneous TID WC  . insulin glargine  25 Units Subcutaneous Daily  . isosorbide dinitrate  40 mg Oral BID  . paricalcitol  1 mcg Intravenous 3 times weekly  . predniSONE  10 mg Oral QAC breakfast  . sodium bicarbonate  1,300 mg Oral BID  . tacrolimus  1 mg Oral BID  . torsemide  100 mg Oral TID  . DISCONTD: aspirin  81 mg Oral Daily  . DISCONTD: azaTHIOprine  75 mg Oral Daily  . DISCONTD: doxazosin  8 mg Oral BID  . DISCONTD: isosorbide dinitrate  40 mg Oral TID  . DISCONTD: paricalcitol  1 mcg Oral Q M,W,F  . DISCONTD: paricalcitol  1 mcg Oral 3 times weekly  . DISCONTD: predniSONE  10 mg Oral Daily  . DISCONTD: rosuvastatin  40 mg Oral QHS  . DISCONTD: simvastatin  20 mg Oral q1800  . DISCONTD: tacrolimus  1 mg Oral BID   Warfarin History: PTA dose--2.5 mg daily, admitted with INR of 4.65 11/19: No warfarin (INR 4.65 >> 3.94)  Assessment: 65 y.o. M on warfarin for recent hx/o LLE DVT with a SUPRAtherapeutic INR this a.m. though trending down. No s/sx of bleeding noted. Per home med reconciliation patient was taking warfarin 2.5 mg daily  PTA.  Goal of Therapy:  INR 2-3   Plan:  1. No coumadin today 2. Will continue to monitor for any signs/symptoms of bleeding and will follow up with PT/INR in the a.m.   Georgina Pillion Ann 04/16/2011,10:24 AM

## 2011-04-17 ENCOUNTER — Inpatient Hospital Stay (HOSPITAL_COMMUNITY): Payer: Medicare Other

## 2011-04-17 LAB — PROTIME-INR: Prothrombin Time: 32.8 seconds — ABNORMAL HIGH (ref 11.6–15.2)

## 2011-04-17 LAB — GLUCOSE, CAPILLARY

## 2011-04-17 LAB — URINALYSIS, ROUTINE W REFLEX MICROSCOPIC
Bilirubin Urine: NEGATIVE
Glucose, UA: NEGATIVE mg/dL
Hgb urine dipstick: NEGATIVE
Ketones, ur: NEGATIVE mg/dL
Leukocytes, UA: NEGATIVE
pH: 5 (ref 5.0–8.0)

## 2011-04-17 LAB — CBC
MCH: 31 pg (ref 26.0–34.0)
Platelets: 113 10*3/uL — ABNORMAL LOW (ref 150–400)
RBC: 3.06 MIL/uL — ABNORMAL LOW (ref 4.22–5.81)

## 2011-04-17 LAB — BASIC METABOLIC PANEL
CO2: 29 mEq/L (ref 19–32)
Calcium: 8.4 mg/dL (ref 8.4–10.5)
GFR calc non Af Amer: 21 mL/min — ABNORMAL LOW (ref >90)
Sodium: 141 mEq/L (ref 135–145)

## 2011-04-17 LAB — URINE MICROSCOPIC-ADD ON

## 2011-04-17 MED ORDER — WARFARIN SODIUM 1 MG PO TABS
1.0000 mg | ORAL_TABLET | Freq: Once | ORAL | Status: AC
  Administered 2011-04-17: 1 mg via ORAL
  Filled 2011-04-17: qty 1

## 2011-04-17 MED ORDER — PARICALCITOL 5 MCG/ML IV SOLN
INTRAVENOUS | Status: AC
  Filled 2011-04-17: qty 1

## 2011-04-17 MED ORDER — BIOTENE DRY MOUTH MT LIQD
15.0000 mL | Freq: Two times a day (BID) | OROMUCOSAL | Status: DC
  Administered 2011-04-17 – 2011-04-18 (×4): 15 mL via OROMUCOSAL

## 2011-04-17 NOTE — Progress Notes (Signed)
Patient ID: Brandon Sharp, male   DOB: [birth year], 65 y.o.   MRN: [id number]  S:Reports to have had a restful night after coming off dialysis last night   O: BP 152/68  Pulse 72  Temp(Src) 98.1 F (36.7 C) (Oral)  Resp 20  Ht 6' (1.829 m)  Wt 73.9 kg (162 lb 14.7 oz)  BMI 22.10 kg/m2  SpO2 98%  NWG:NFAOZH B/Fast in bed, comfortable YQM:VHQIO RRR, Normal S1 and S2 sounds Resp:Coarse BS bilaterally, No rales NGE:XBMW, Obese, NT, BS normal Ext:2+ upper extremity edema. 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      Marland Kitchen antiseptic oral rinse  15 mL Mouth Rinse BID  . aspirin EC  81 mg Oral Daily  . atorvastatin  40 mg Oral q1800  . azaTHIOprine  75 mg Oral Daily  . carvedilol  50 mg Oral BID WC  . colesevelam  3,750 mg Oral Daily  . darbepoetin (ARANESP) injection - DIALYSIS  100 mcg Intravenous Q Mon-HD  . doxazosin  8 mg Oral Q12H  . famotidine  20 mg Oral Daily  . hydrALAZINE  25 mg Oral TID  . influenza  inactive virus vaccine  0.5 mL Intramuscular Tomorrow-1000  . insulin aspart  0-5 Units Subcutaneous QHS  . insulin aspart  0-9 Units Subcutaneous TID WC  . insulin glargine  25 Units Subcutaneous Daily  . isosorbide dinitrate  40 mg Oral BID  . paricalcitol  1 mcg Intravenous 3 times weekly  . predniSONE  10 mg Oral QAC breakfast  . sodium bicarbonate  1,300 mg Oral BID  . tacrolimus  1 mg Oral BID  . torsemide  100 mg Oral TID  . DISCONTD: aspirin  81 mg Oral Daily  . DISCONTD: azaTHIOprine  75 mg Oral Daily  . DISCONTD: doxazosin  8 mg Oral BID  . DISCONTD: hydrALAZINE  75 mg Oral TID  . DISCONTD: paricalcitol  1 mcg Oral Q M,W,F  . DISCONTD: predniSONE  10 mg Oral Daily  . DISCONTD: rosuvastatin  40 mg Oral QHS  . DISCONTD: simvastatin  20 mg Oral q1800    BMET  Lab 04/16/11 0920 04/15/11 1112 04/11/11 1217  NA 141 146* 145  K 3.6 4.2 4.9  CL 109 115* 115*  CO2 20 17* 17*  GLUCOSE 162* 90 64*  BUN 49* 80* 87*  CREATININE 3.60* 4.68*  4.96*  ALB -- -- --  CALCIUM 8.3* 8.6 8.8  PHOS 3.1 -- --   CBC  Lab 04/16/11 0920 04/15/11 1112 04/11/11 1217  WBC 4.3 3.6* 5.1  NEUTROABS -- -- 4.1  HGB 9.1* 9.4* 8.9*  HCT 28.5* 29.3* 28.3*  MCV 97.9 99.7 99.0  PLT 105* 135* 132*    @IMGRELPRIORS @  Assessment/Plan: 1.Volume overload/Anasarca: Improving on dialysis. Plan for dialysis again today. 2.ESRD: Plan for HD today and began process for out-patient dialysis unit placement yesterday. Should likely hear back from placement office on Friday to allow DC then. No problems using RUA AVF. Will stop sodium bicarbonate. 3. Anemia:On Aranesp. No overt losses. 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 5. Nutrition:Monitor and start ONS if albumin <3.5. 6. Hypertension:Fair control, anticipate to improve with HD/UF.  Brandon Sharp K.

## 2011-04-17 NOTE — Progress Notes (Signed)
ANTICOAGULATION CONSULT NOTE - Follow Up Consult  Pharmacy Consult for Warfarin Indication: History of recent LLE DVT  Allergies  Allergen Reactions  . Lisinopril Swelling    Lips and tongue swell  . Norvasc (Amlodipine Besylate) Rash    Flushing  . Penicillins Rash    Patient Measurements: Height: 6' (182.9 cm) Weight: 163 lb 9.3 oz (74.2 kg) IBW/kg (Calculated) : 77.6    Vital Signs: Temp: 97.3 F (36.3 C) (11/21 1042) Temp src: Oral (11/21 1042) BP: 137/72 mmHg (11/21 1230) Pulse Rate: 66  (11/21 1230)  Labs:  Basename 04/17/11 0933 04/16/11 0920 04/16/11 0615 04/15/11 1112  HGB 9.5* 9.1* -- --  HCT 30.2* 28.5* -- 29.3*  PLT 113* 105* -- 135*  APTT -- -- -- --  LABPROT 32.8* -- 39.1* 44.5*  INR 3.15* -- 3.94* 4.65*  HEPARINUNFRC -- -- -- --  CREATININE 2.96* 3.60* -- 4.68*  CKTOTAL -- -- -- --  CKMB -- -- -- --  TROPONINI -- -- -- --   Estimated Creatinine Clearance: 25.8 ml/min (by C-G formula based on Cr of 2.96).   Medications:  Prescriptions prior to admission  Medication Sig Dispense Refill  . aspirin EC 81 MG tablet Take 81 mg by mouth daily.        Marland Kitchen atorvastatin (LIPITOR) 40 MG tablet Take 40 mg by mouth daily.        Marland Kitchen azaTHIOprine (IMURAN) 50 MG tablet Take 75 mg by mouth daily.        . Cholecalciferol (VITAMIN D PO) Take 1 capsule by mouth daily.        . colesevelam (WELCHOL) 625 MG tablet Take 3,750 mg by mouth daily. Takes 6 tabs daily      . doxazosin (CARDURA) 8 MG tablet Take 8 mg by mouth 2 (two) times daily.        . famotidine (PEPCID) 20 MG tablet Take 20 mg by mouth daily.        . hydrALAZINE (APRESOLINE) 25 MG tablet Take 75 mg by mouth 3 (three) times daily.        . isosorbide dinitrate (ISORDIL) 40 MG tablet Take 40 mg by mouth 3 (three) times daily.        . paricalcitol (ZEMPLAR) 1 MCG capsule Take 1 mcg by mouth See admin instructions. Takes on Mon, Wed & Fri       . predniSONE (DELTASONE) 10 MG tablet Take 10 mg by mouth  daily.        . sodium bicarbonate 650 MG tablet Take 2 tablets (1,300 mg total) by mouth 2 (two) times daily.  30 tablet  0  . tacrolimus (PROGRAF) 1 MG capsule Take 1 mg by mouth 2 (two) times daily.        Marland Kitchen torsemide (DEMADEX) 100 MG tablet Take 1 tablet (100 mg total) by mouth 3 (three) times daily.  30 tablet  0  . warfarin (COUMADIN) 2.5 MG tablet Take 2.5 mg by mouth daily.        Scheduled:     . antiseptic oral rinse  15 mL Mouth Rinse BID  . aspirin EC  81 mg Oral Daily  . atorvastatin  40 mg Oral q1800  . azaTHIOprine  75 mg Oral Daily  . carvedilol  50 mg Oral BID WC  . colesevelam  3,750 mg Oral Daily  . darbepoetin (ARANESP) injection - DIALYSIS  100 mcg Intravenous Q Mon-HD  . doxazosin  8 mg Oral Q12H  .  famotidine  20 mg Oral Daily  . hydrALAZINE  25 mg Oral TID  . influenza  inactive virus vaccine  0.5 mL Intramuscular Tomorrow-1000  . insulin aspart  0-5 Units Subcutaneous QHS  . insulin aspart  0-9 Units Subcutaneous TID WC  . insulin glargine  25 Units Subcutaneous Daily  . isosorbide dinitrate  40 mg Oral BID  . paricalcitol      . paricalcitol  1 mcg Intravenous 3 times weekly  . predniSONE  10 mg Oral QAC breakfast  . tacrolimus  1 mg Oral BID  . torsemide  100 mg Oral TID  . DISCONTD: hydrALAZINE  75 mg Oral TID  . DISCONTD: sodium bicarbonate  1,300 mg Oral BID   Warfarin History: PTA dose--2.5 mg daily, admitted with INR of 4.65 11/19: No warfarin (INR 4.65 >> 3.94) 11/20: No warfarin (INR 3.94 >> 3.15)  Assessment: 65 y.o. M on warfarin for recent hx/o LLE DVT with a slightly SUPRAtherapeutic INR this a.m. though trending down. No s/sx of bleeding noted. Per home med reconciliation patient was taking warfarin 2.5 mg daily PTA. Will resume low-dose of warfarin tonight.  Goal of Therapy:  INR 2-3   Plan:  1. Warfarin 1 mg x 1 dose at 1800 today 2. Will continue to monitor for any signs/symptoms of bleeding and will follow up with PT/INR in the  a.m.   Georgina Pillion Ann 04/17/2011,1:00 PM

## 2011-04-17 NOTE — Progress Notes (Signed)
Physical Therapy Treatment Patient Details Name: Brandon Sharp MRN: [id number] DOB: 07/05/1945 Today's Date: 04/17/2011  Pt still in HD.  Will F/U Friday.    Sunny Schlein, [place] [phone] 04/17/2011, 1:56 PM

## 2011-04-17 NOTE — Procedures (Signed)
Patient seen on Hemodialysis. Voices no complaints at this time.   Blood Flow rate 280  BP 143/74  UF goal 3.8L   Danasha Melman K.

## 2011-04-17 NOTE — Progress Notes (Signed)
Subjective: No current complaints.  Objective: Vital signs in last 24 hours: Temp:  [97.1 F (36.2 C)-98.5 F (36.9 C)] 97.3 F (36.3 C) (11/21 1042) Pulse Rate:  [67-84] 70  (11/21 1042) Resp:  [16-20] 18  (11/21 1042) BP: (149-180)/(68-85) 165/74 mmHg (11/21 1042) SpO2:  [94 %-100 %] 95 % (11/21 1042) Weight:  [73.9 kg (162 lb 14.7 oz)-76.2 kg (167 lb 15.9 oz)] 163 lb 9.3 oz (74.2 kg) (11/21 1042) Weight change: -2.9 kg (-6 lb 6.3 oz) Last BM Date: 04/15/11  Intake/Output from previous day: 11/20 0701 - 11/21 0700 In: 840 [P.O.:840] Out: 2001 [Urine:1] Total I/O In: 240 [P.O.:240] Out: 1 [Urine:1]   Physical Exam: General: Alert, awake, oriented x3, in no acute distress. HEENT: No bruits, no goiter. Heart: Regular rate and rhythm, without murmurs, rubs, gallops. Lungs: Clear to auscultation bilaterally. Abdomen: Soft, nontender, nondistended, positive bowel sounds. Extremities: 2+ upper extremity edema. Left leg lymphedema. Neuro: Grossly intact, nonfocal.    Lab Results: Basic Metabolic Panel:  Basename 04/17/11 0933 04/16/11 0920  NA 141 141  K 3.3* 3.6  CL 104 109  CO2 29 20  GLUCOSE 92 162*  BUN 31* 49*  CREATININE 2.96* 3.60*  CALCIUM 8.4 8.3*  MG -- --  PHOS -- 3.1   Liver Function Tests:  Basename 04/16/11 0920 04/15/11 2001  AST -- --  ALT -- 9  ALKPHOS -- --  BILITOT -- --  PROT -- --  ALBUMIN 2.4* --   CBC:  Basename 04/17/11 0933 04/16/11 0920  WBC 4.2 4.3  NEUTROABS -- --  HGB 9.5* 9.1*  HCT 30.2* 28.5*  MCV 98.7 97.9  PLT 113* 105*   CBG:  Basename 04/17/11 0756 04/16/11 2129 04/16/11 1630 04/16/11 1142 04/16/11 0752 04/16/11 0056  GLUCAP 76 109* 200* 177* 115* 128*   Anemia Panel:  Basename 04/15/11 2001  VITAMINB12 --  FOLATE --  FERRITIN --  TIBC 196*  IRON 34*  RETICCTPCT --   Coagulation:  Basename 04/17/11 0933 04/16/11 0615  LABPROT 32.8* 39.1*  INR 3.15* 3.94*    Studies/Results: Dg Chest 2  View  04/17/2011  *RADIOLOGY REPORT*  Clinical Data: Fever and loculated effusions.  CHEST - 2 VIEW  Comparison: 04/15/2011  Findings: Two views of the chest were obtained.   Heart size remains upper limits of normal.  Again noted is a vascular stents in the left upper chest.  There is decreased fluid in the lung fissures. There is decreased pleural based density or fluid along the lateral right lung base.  There is persistent blunting at the costophrenic angle suggesting pleural fluid or pleural thickening.  IMPRESSION: Decreased pleural fluid in the right lung.  Persistent pleural and parenchymal densities at the lung bases.  Original Report Authenticated By: Richarda Overlie, M.D.   Dg Chest 2 View  04/15/2011  *RADIOLOGY REPORT*  Clinical Data: Increasing weakness and  fatigue.  Crackles of the lung bases.  History of heart and renal transplants.  Lower extremity swelling.  CHEST - 2 VIEW  Comparison: 04/11/2011  Findings: Chronic cardiomegaly.  Pulmonary vascular congestion. Interstitial edema has improved.  Tiny bilateral effusions. Increased fluid along the fissures on the right as well as fluid loculated laterally at the right base.  IMPRESSION: Improved pulmonary edema.  However, the patient now has loculated fluid at the right base laterally and along the fissures.  Original Report Authenticated By: [name], M.D.    Medications: Scheduled Meds:   . antiseptic oral rinse  15 mL Mouth Rinse BID  . aspirin EC  81 mg Oral Daily  . atorvastatin  40 mg Oral q1800  . azaTHIOprine  75 mg Oral Daily  . carvedilol  50 mg Oral BID WC  . colesevelam  3,750 mg Oral Daily  . darbepoetin (ARANESP) injection - DIALYSIS  100 mcg Intravenous Q Mon-HD  . doxazosin  8 mg Oral Q12H  . famotidine  20 mg Oral Daily  . hydrALAZINE  25 mg Oral TID  . influenza  inactive virus vaccine  0.5 mL Intramuscular Tomorrow-1000  . insulin aspart  0-5 Units Subcutaneous QHS  . insulin aspart  0-9 Units Subcutaneous  TID WC  . insulin glargine  25 Units Subcutaneous Daily  . isosorbide dinitrate  40 mg Oral BID  . paricalcitol  1 mcg Intravenous 3 times weekly  . predniSONE  10 mg Oral QAC breakfast  . tacrolimus  1 mg Oral BID  . torsemide  100 mg Oral TID  . DISCONTD: hydrALAZINE  75 mg Oral TID  . DISCONTD: sodium bicarbonate  1,300 mg Oral BID   Continuous Infusions:  PRN Meds:.sodium chloride, sodium chloride, cloNIDine, feeding supplement (NEPRO CARB STEADY), heparin, lidocaine, lidocaine-prilocaine, pentafluoroprop-tetrafluoroeth  Assessment/Plan:  Principal Problem:  *Uremia syndrome Active Problems:  DVT (deep venous thrombosis)  Coagulopathy  Heart transplanted  Renal transplant failure and rejection  Dyslipidemia  Fever  HTN (hypertension), malignant  DM type 2 causing complication  CKD (chronic kidney disease), stage V  #1 chronic kidney disease stage V: Newly started this admission on hemodialysis having failed renal transplantation in 2008. Outpatient clipping process is in process. We can discharge home once this is complete.  #2 history of DVT: He maintains on chronic anticoagulation with Coumadin. His INR is supratherapeutic at this time. Pharmacy is dosing.  #3 heart transplant: Continue immune suppression drugs.  #4 fever: He is currently afebrile. Urine culture is still pending. Currently not on antibiotics.  #5 hypertension: This is improving with hemodialysis.  #6 type 2 diabetes mellitus: Well controlled.  #7 disposition: Discharge home once clipping process is complete.  #8 chronic systolic CHF: Ejection fraction 40-45%. He is status post a heart transplantation.   LOS: 2 days   HERNANDEZ ACOSTA,ESTELA 04/17/2011, 10:51 AM

## 2011-04-17 NOTE — Progress Notes (Deleted)
Patient ID: Brandon Sharp, male   DOB: [birth year], 65 y.o.   MRN: [id number]  Patient seen on Hemodialysis. Voices no complaints at this time. Blood Flow rate 280   BP 143/74 UF goal 3.8L  [name]

## 2011-04-17 NOTE — Progress Notes (Signed)
Physical Therapy Patient Details Name: Brandon Sharp MRN: [id number] DOB: [birth year] Today's Date: 04/17/2011  Pt currently at HD.  Will try another time.    Sunny Schlein, [place] [phone] 04/17/2011, 10:46 AM

## 2011-04-18 LAB — RENAL FUNCTION PANEL
Albumin: 2.4 g/dL — ABNORMAL LOW (ref 3.5–5.2)
CO2: 28 mEq/L (ref 19–32)
Calcium: 7.7 mg/dL — ABNORMAL LOW (ref 8.4–10.5)
Chloride: 101 mEq/L (ref 96–112)
GFR calc Af Amer: 28 mL/min — ABNORMAL LOW (ref >90)
GFR calc non Af Amer: 24 mL/min — ABNORMAL LOW (ref >90)
Sodium: 139 mEq/L (ref 135–145)

## 2011-04-18 LAB — GLUCOSE, CAPILLARY
Glucose-Capillary: 85 mg/dL (ref 70–99)
Glucose-Capillary: 171 mg/dL — ABNORMAL HIGH (ref 70–99)

## 2011-04-18 LAB — URINE CULTURE: Culture  Setup Time: 201211211007

## 2011-04-18 MED ORDER — WARFARIN SODIUM 2.5 MG PO TABS
2.5000 mg | ORAL_TABLET | Freq: Once | ORAL | Status: AC
  Administered 2011-04-18: 2.5 mg via ORAL
  Filled 2011-04-18: qty 1

## 2011-04-18 MED ORDER — PROMETHAZINE HCL 25 MG PO TABS: 12.5000 mg | ORAL_TABLET | Freq: Four times a day (QID) | ORAL | Status: DC | PRN

## 2011-04-18 MED ORDER — CAMPHOR-MENTHOL 0.5-0.5 % EX LOTN
1.0000 "application " | TOPICAL_LOTION | Freq: Three times a day (TID) | CUTANEOUS | Status: DC | PRN
  Filled 2011-04-18: qty 222

## 2011-04-18 MED ORDER — PROMETHAZINE HCL 25 MG RE SUPP: 25.0000 mg | Freq: Two times a day (BID) | RECTAL | Status: DC | PRN

## 2011-04-18 MED ORDER — CALCIUM CARBONATE 1250 MG/5ML PO SUSP: 500.0000 mg | Freq: Four times a day (QID) | ORAL | Status: DC | PRN

## 2011-04-18 MED ORDER — ACETAMINOPHEN 650 MG RE SUPP: 650.0000 mg | Freq: Four times a day (QID) | RECTAL | Status: DC | PRN

## 2011-04-18 MED ORDER — SORBITOL 70 % SOLN: 30.0000 mL | Freq: Every day | Status: DC | PRN

## 2011-04-18 MED ORDER — ZOLPIDEM TARTRATE 5 MG PO TABS: 5.0000 mg | ORAL_TABLET | Freq: Every evening | ORAL | Status: DC | PRN

## 2011-04-18 MED ORDER — DOCUSATE SODIUM 283 MG RE ENEM: 1.0000 | ENEMA | Freq: Every day | RECTAL | Status: DC | PRN

## 2011-04-18 MED ORDER — HEPARIN SODIUM (PORCINE) 1000 UNIT/ML DIALYSIS
40.0000 [IU]/kg | INTRAMUSCULAR | Status: DC | PRN
  Filled 2011-04-18: qty 3

## 2011-04-18 MED ORDER — HYDROXYZINE HCL 25 MG PO TABS: 25.0000 mg | ORAL_TABLET | Freq: Three times a day (TID) | ORAL | Status: DC | PRN

## 2011-04-18 MED ORDER — NEPRO/CARBSTEADY PO LIQD: 237.0000 mL | Freq: Three times a day (TID) | ORAL | Status: DC | PRN

## 2011-04-18 MED ORDER — ACETAMINOPHEN 325 MG PO TABS: 650.0000 mg | ORAL_TABLET | Freq: Four times a day (QID) | ORAL | Status: DC | PRN

## 2011-04-18 NOTE — Progress Notes (Signed)
Subjective: No current complaints.  Objective: Vital signs in last 24 hours: Temp:  [97 F (36.1 C)-98.6 F (37 C)] 97.4 F (36.3 C) (11/22 0945) Pulse Rate:  [66-74] 71  (11/22 0945) Resp:  [19-20] 20  (11/22 0945) BP: (128-165)/(70-82) 165/81 mmHg (11/22 0945) SpO2:  [92 %-98 %] 96 % (11/22 0945) Weight:  [71.1 kg (156 lb 12 oz)-72.3 kg (159 lb 6.3 oz)] 159 lb 6.3 oz (72.3 kg) (11/21 2140) Weight change: -2 kg (-4 lb 6.6 oz) Last BM Date: 04/15/11  Intake/Output from previous day: 11/21 0701 - 11/22 0700 In: 480 [P.O.:480] Out: 3451 [Urine:451] Total I/O In: 120 [P.O.:120] Out: -    Physical Exam: General: Alert, awake, oriented x3, in no acute distress. HEENT: No bruits, no goiter. Heart: Regular rate and rhythm, without murmurs, rubs, gallops. Lungs: Clear to auscultation bilaterally. Abdomen: Soft, nontender, nondistended, positive bowel sounds. Extremities: 1+ upper extremity edema. Neuro: Grossly intact, nonfocal.    Lab Results: Basic Metabolic Panel:  Basename 04/18/11 0535 04/17/11 0933 04/16/11 0920  NA 139 141 --  K 3.5 3.3* --  CL 101 104 --  CO2 28 29 --  GLUCOSE 88 92 --  BUN 21 31* --  CREATININE 2.61* 2.96* --  CALCIUM 7.7* 8.4 --  MG -- -- --  PHOS 2.5 -- 3.1   Liver Function Tests:  Basename 04/18/11 0535 04/16/11 0920 04/15/11 2001  AST -- -- --  ALT -- -- 9  ALKPHOS -- -- --  BILITOT -- -- --  PROT -- -- --  ALBUMIN 2.4* 2.4* --   CBC:  Basename 04/17/11 0933 04/16/11 0920  WBC 4.2 4.3  NEUTROABS -- --  HGB 9.5* 9.1*  HCT 30.2* 28.5*  MCV 98.7 97.9  PLT 113* 105*   CBG:  Basename 04/18/11 1216 04/18/11 0738 04/17/11 2127 04/17/11 1642 04/17/11 0756 04/16/11 2129  GLUCAP 167* 85 163* 147* 76 109*   Anemia Panel:  Basename 04/15/11 2001  VITAMINB12 --  FOLATE --  FERRITIN --  TIBC 196*  IRON 34*  RETICCTPCT --   Coagulation:  Basename 04/18/11 0535 04/17/11 0933  LABPROT 29.2* 32.8*  INR 2.71* 3.15*     Studies/Results: Dg Chest 2 View  04/17/2011  *RADIOLOGY REPORT*  Clinical Data: Fever and loculated effusions.  CHEST - 2 VIEW  Comparison: 04/15/2011  Findings: Two views of the chest were obtained.   Heart size remains upper limits of normal.  Again noted is a vascular stents in the left upper chest.  There is decreased fluid in the lung fissures. There is decreased pleural based density or fluid along the lateral right lung base.  There is persistent blunting at the costophrenic angle suggesting pleural fluid or pleural thickening.  IMPRESSION: Decreased pleural fluid in the right lung.  Persistent pleural and parenchymal densities at the lung bases.  Original Report Authenticated By: Richarda Overlie, M.D.    Medications: Scheduled Meds:    . antiseptic oral rinse  15 mL Mouth Rinse BID  . aspirin EC  81 mg Oral Daily  . atorvastatin  40 mg Oral q1800  . azaTHIOprine  75 mg Oral Daily  . carvedilol  50 mg Oral BID WC  . colesevelam  3,750 mg Oral Daily  . darbepoetin (ARANESP) injection - DIALYSIS  100 mcg Intravenous Q Mon-HD  . doxazosin  8 mg Oral Q12H  . famotidine  20 mg Oral Daily  . hydrALAZINE  25 mg Oral TID  . insulin aspart  0-5  Units Subcutaneous QHS  . insulin aspart  0-9 Units Subcutaneous TID WC  . insulin glargine  25 Units Subcutaneous Daily  . isosorbide dinitrate  40 mg Oral BID  . paricalcitol      . paricalcitol  1 mcg Intravenous 3 times weekly  . predniSONE  10 mg Oral QAC breakfast  . tacrolimus  1 mg Oral BID  . torsemide  100 mg Oral TID  . warfarin  1 mg Oral ONCE-1800   Continuous Infusions:  PRN Meds:.sodium chloride, sodium chloride, acetaminophen, acetaminophen, calcium carbonate (dosed in mg elemental calcium), camphor-menthol, cloNIDine, docusate sodium, feeding supplement (NEPRO CARB STEADY), feeding supplement (NEPRO CARB STEADY), heparin, heparin, hydrOXYzine, lidocaine, lidocaine-prilocaine, pentafluoroprop-tetrafluoroeth, promethazine,  promethazine, sorbitol, zolpidem  Assessment/Plan:  Principal Problem:  *Uremia syndrome Active Problems:  DVT (deep venous thrombosis)  Coagulopathy  Heart transplanted  Renal transplant failure and rejection  Dyslipidemia  Fever  HTN (hypertension), malignant  DM type 2 causing complication  CKD (chronic kidney disease), stage V  #1 chronic kidney disease stage V: Newly started this admission on hemodialysis having failed renal transplantation in 2008. Outpatient clipping process is in process. We can discharge home once this is complete. Nephrology is planning on dialysis again tomorrow.  #2 history of DVT: He maintains on chronic anticoagulation with Coumadin. His INR is supratherapeutic at this time. Pharmacy is dosing.  #3 heart transplant: Continue immune suppression drugs.  #4 fever: He is currently afebrile. Urine culture is still pending. Currently not on antibiotics.  #5 hypertension: This is improving with hemodialysis.  #6 type 2 diabetes mellitus: Well controlled.  #7 disposition: Discharge home once clipping process is complete.  #8 chronic systolic CHF: Ejection fraction 40-45%. He is status post a heart transplantation.   LOS: 3 days   HERNANDEZ ACOSTA,ESTELA 04/18/2011, 12:47 PM

## 2011-04-18 NOTE — Progress Notes (Signed)
Patient ID: Brandon Sharp, male   DOB: 12-06-1945, 65 y.o.   MRN: [id number]  S:Reports to be feeling better with improvement in pedal/arm swelling   O: BP 165/81  Pulse 71  Temp(Src) 97.4 F (36.3 C) (Oral)  Resp 20  Ht 6' (1.829 m)  Wt 72.3 kg (159 lb 6.3 oz)  BMI 21.62 kg/m2  SpO2 96%  NWG:NFAOZHYQMVH resting in bed (Laying flat) QIO:NGEXB RRR, S1 and S2 with ESM Resp:Decreased BS left base, otherwise CTA MWU:XLKG, flat, NT, BS normal MWN:UUVOZ pedal edema, 2+ UE edema      . antiseptic oral rinse  15 mL Mouth Rinse BID  . aspirin EC  81 mg Oral Daily  . atorvastatin  40 mg Oral q1800  . azaTHIOprine  75 mg Oral Daily  . carvedilol  50 mg Oral BID WC  . colesevelam  3,750 mg Oral Daily  . darbepoetin (ARANESP) injection - DIALYSIS  100 mcg Intravenous Q Mon-HD  . doxazosin  8 mg Oral Q12H  . famotidine  20 mg Oral Daily  . hydrALAZINE  25 mg Oral TID  . insulin aspart  0-5 Units Subcutaneous QHS  . insulin aspart  0-9 Units Subcutaneous TID WC  . insulin glargine  25 Units Subcutaneous Daily  . isosorbide dinitrate  40 mg Oral BID  . paricalcitol      . paricalcitol  1 mcg Intravenous 3 times weekly  . predniSONE  10 mg Oral QAC breakfast  . tacrolimus  1 mg Oral BID  . torsemide  100 mg Oral TID  . warfarin  1 mg Oral ONCE-1800    BMET  Lab 04/18/11 0535 04/17/11 0933 04/16/11 0920 04/15/11 1112 04/11/11 1217  NA 139 141 141 146* 145  K 3.5 3.3* 3.6 4.2 4.9  CL 101 104 109 115* 115*  CO2 28 29 20  17* 17*  GLUCOSE 88 92 162* 90 64*  BUN 21 31* 49* 80* 87*  CREATININE 2.61* 2.96* 3.60* 4.68* 4.96*  ALB -- -- -- -- --  CALCIUM 7.7* 8.4 8.3* 8.6 8.8  PHOS 2.5 -- 3.1 -- --   CBC  Lab 04/17/11 0933 04/16/11 0920 04/15/11 1112 04/11/11 1217  WBC 4.2 4.3 3.6* 5.1  NEUTROABS -- -- -- 4.1  HGB 9.5* 9.1* 9.4* 8.9*  HCT 30.2* 28.5* 29.3* 28.3*  MCV 98.7 97.9 99.7 99.0  PLT 113* 105* 135* 132*    @IMGRELPRIORS @  Assessment/Plan: 1.Volume  overload/Anasarca: Improving on dialysis. Plan for dialysis again tomorrow.  2.ESRD: Plan for HD tomorrow with anticipated DC thereafter if we know his dialysis schedule/placement. No problems using RUA AVF.  3. Anemia:On Aranesp. No overt losses.  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  5. Nutrition:Monitor and start ONS if albumin <3.5.  6. Hypertension:Fair control, anticipate to improve with HD/UF.  Dailah Opperman K.

## 2011-04-18 NOTE — Progress Notes (Signed)
ANTICOAGULATION CONSULT NOTE - Follow Up Consult  Pharmacy Consult for Warfarin Indication: History of recent LLE DVT  Allergies  Allergen Reactions  . Lisinopril Swelling    Lips and tongue swell  . Norvasc (Amlodipine Besylate) Rash    Flushing  . Penicillins Rash    Patient Measurements: Height: 6' (182.9 cm) Weight: 159 lb 6.3 oz (72.3 kg) IBW/kg (Calculated) : 77.6    Vital Signs: Temp: 97.4 F (36.3 C) (11/22 0945) Temp src: Oral (11/22 0945) BP: 165/81 mmHg (11/22 0945) Pulse Rate: 71  (11/22 0945)  Labs:  Basename 04/18/11 0535 04/17/11 0933 04/16/11 0920 04/16/11 0615  HGB -- 9.5* 9.1* --  HCT -- 30.2* 28.5* --  PLT -- 113* 105* --  APTT -- -- -- --  LABPROT 29.2* 32.8* -- 39.1*  INR 2.71* 3.15* -- 3.94*  HEPARINUNFRC -- -- -- --  CREATININE 2.61* 2.96* 3.60* --  CKTOTAL -- -- -- --  CKMB -- -- -- --  TROPONINI -- -- -- --   Estimated Creatinine Clearance: 28.5 ml/min (by C-G formula based on Cr of 2.61).   Medications:  Prescriptions prior to admission  Medication Sig Dispense Refill  . aspirin EC 81 MG tablet Take 81 mg by mouth daily.        Marland Kitchen atorvastatin (LIPITOR) 40 MG tablet Take 40 mg by mouth daily.        Marland Kitchen azaTHIOprine (IMURAN) 50 MG tablet Take 75 mg by mouth daily.        . Cholecalciferol (VITAMIN D PO) Take 1 capsule by mouth daily.        . colesevelam (WELCHOL) 625 MG tablet Take 3,750 mg by mouth daily. Takes 6 tabs daily      . doxazosin (CARDURA) 8 MG tablet Take 8 mg by mouth 2 (two) times daily.        . famotidine (PEPCID) 20 MG tablet Take 20 mg by mouth daily.        . hydrALAZINE (APRESOLINE) 25 MG tablet Take 75 mg by mouth 3 (three) times daily.        . isosorbide dinitrate (ISORDIL) 40 MG tablet Take 40 mg by mouth 3 (three) times daily.        . paricalcitol (ZEMPLAR) 1 MCG capsule Take 1 mcg by mouth See admin instructions. Takes on Mon, Wed & Fri       . predniSONE (DELTASONE) 10 MG tablet Take 10 mg by mouth daily.         . sodium bicarbonate 650 MG tablet Take 2 tablets (1,300 mg total) by mouth 2 (two) times daily.  30 tablet  0  . tacrolimus (PROGRAF) 1 MG capsule Take 1 mg by mouth 2 (two) times daily.        Marland Kitchen torsemide (DEMADEX) 100 MG tablet Take 1 tablet (100 mg total) by mouth 3 (three) times daily.  30 tablet  0  . warfarin (COUMADIN) 2.5 MG tablet Take 2.5 mg by mouth daily.        Scheduled:     . antiseptic oral rinse  15 mL Mouth Rinse BID  . aspirin EC  81 mg Oral Daily  . atorvastatin  40 mg Oral q1800  . azaTHIOprine  75 mg Oral Daily  . carvedilol  50 mg Oral BID WC  . colesevelam  3,750 mg Oral Daily  . darbepoetin (ARANESP) injection - DIALYSIS  100 mcg Intravenous Q Mon-HD  . doxazosin  8 mg Oral Q12H  .  famotidine  20 mg Oral Daily  . hydrALAZINE  25 mg Oral TID  . insulin aspart  0-5 Units Subcutaneous QHS  . insulin aspart  0-9 Units Subcutaneous TID WC  . insulin glargine  25 Units Subcutaneous Daily  . isosorbide dinitrate  40 mg Oral BID  . paricalcitol      . paricalcitol  1 mcg Intravenous 3 times weekly  . predniSONE  10 mg Oral QAC breakfast  . tacrolimus  1 mg Oral BID  . torsemide  100 mg Oral TID  . warfarin  1 mg Oral ONCE-1800   Warfarin History: PTA dose--2.5 mg daily, admitted with INR of 4.65 11/19: No warfarin (INR 4.65 >> 3.94) 11/20: No warfarin (INR 3.94 >> 3.15)  Assessment: 65 y.o. M on warfarin for recent hx/o LLE DVT with a therapeutic INR this a.m.  No s/sx of bleeding noted. Per home med reconciliation patient was taking warfarin 2.5 mg daily PTA. Will resume pt home dose of 2.5 mg.  Goal of Therapy:  INR 2-3   Plan:  1. Warfarin 2.5 mg x 1 dose at 1800 today 2. Will continue to monitor for any signs/symptoms of bleeding and will follow up with PT/INR in the a.m.   Eugene Garnet 04/18/2011,1:38 PM

## 2011-04-19 ENCOUNTER — Inpatient Hospital Stay (HOSPITAL_COMMUNITY): Payer: Medicare Other

## 2011-04-19 LAB — RENAL FUNCTION PANEL
Albumin: 2.4 g/dL — ABNORMAL LOW (ref 3.5–5.2)
BUN: 30 mg/dL — ABNORMAL HIGH (ref 6–23)
Chloride: 104 mEq/L (ref 96–112)
GFR calc Af Amer: 19 mL/min — ABNORMAL LOW (ref >90)
Phosphorus: 2.9 mg/dL (ref 2.3–4.6)
Potassium: 3.3 mEq/L — ABNORMAL LOW (ref 3.5–5.1)
Sodium: 138 mEq/L (ref 135–145)

## 2011-04-19 LAB — CBC
HCT: 27.3 % — ABNORMAL LOW (ref 39.0–52.0)
Hemoglobin: 8.6 g/dL — ABNORMAL LOW (ref 13.0–17.0)
RDW: 16.8 % — ABNORMAL HIGH (ref 11.5–15.5)
WBC: 3.6 10*3/uL — ABNORMAL LOW (ref 4.0–10.5)

## 2011-04-19 LAB — GLUCOSE, CAPILLARY
Glucose-Capillary: 101 mg/dL — ABNORMAL HIGH (ref 70–99)
Glucose-Capillary: 67 mg/dL — ABNORMAL LOW (ref 70–99)
Glucose-Capillary: 89 mg/dL (ref 70–99)

## 2011-04-19 MED ORDER — INSULIN GLARGINE 100 UNIT/ML ~~LOC~~ SOLN: 25.0000 [IU] | Freq: Every day | SUBCUTANEOUS | Status: DC

## 2011-04-19 MED ORDER — WARFARIN SODIUM 5 MG PO TABS
2.5000 mg | ORAL_TABLET | Freq: Once | ORAL | Status: DC
  Filled 2011-04-19: qty 1

## 2011-04-19 MED ORDER — PARICALCITOL 5 MCG/ML IV SOLN
INTRAVENOUS | Status: AC
  Filled 2011-04-19: qty 1

## 2011-04-19 MED ORDER — HEPARIN SODIUM (PORCINE) 1000 UNIT/ML DIALYSIS
20.0000 [IU]/kg | INTRAMUSCULAR | Status: DC | PRN
  Filled 2011-04-19: qty 2

## 2011-04-19 MED ORDER — CARVEDILOL 25 MG PO TABS: 50.0000 mg | ORAL_TABLET | Freq: Two times a day (BID) | ORAL | Status: DC

## 2011-04-19 NOTE — Progress Notes (Signed)
04/19/11 nsg. 1644 Pt cbg 65 after hd pt did not eat lunch except for juice and fruits cbg recheck 89. md notified she said it is ok for him to go home. Pt sent with two lantus pen per pharmacy he can take it home; pt claims he has the same pen at home and know how to give his lantus.

## 2011-04-19 NOTE — Progress Notes (Signed)
PT/OT/SLP Cancellation Note  Treatment cancelled today due to patient receiving procedure or test pt in dialysis.  Brandon Sharp L. Ronell Boldin DPT [phone]

## 2011-04-19 NOTE — Progress Notes (Signed)
ANTICOAGULATION CONSULT NOTE - Follow Up Consult  Pharmacy Consult for warfarin Indication: Recent DVT  Allergies  Allergen Reactions  . Lisinopril Swelling    Lips and tongue swell  . Norvasc (Amlodipine Besylate) Rash    Flushing  . Penicillins Rash    Patient Measurements: Height: 6' (182.9 cm) Weight: 164 lb 0.4 oz (74.4 kg) IBW/kg (Calculated) : 77.6  Vital Signs: Temp: 97.4 F (36.3 C) (11/23 0832) Temp src: Oral (11/23 0832) BP: 149/76 mmHg (11/23 1300) Pulse Rate: 75  (11/23 1300)  Labs:  Basename 04/19/11 1030 04/19/11 0930 04/18/11 0535 04/17/11 0933  HGB -- 8.6* -- 9.5*  HCT -- 27.3* -- 30.2*  PLT -- 115* -- 113*  APTT -- -- -- --  LABPROT 25.3* -- 29.2* 32.8*  INR 2.26* -- 2.71* 3.15*  HEPARINUNFRC -- -- -- --  CREATININE -- 3.50* 2.61* 2.96*  CKTOTAL -- -- -- --  CKMB -- -- -- --  TROPONINI -- -- -- --   Estimated Creatinine Clearance: 21.8 ml/min (by C-G formula based on Cr of 3.5).   Medications:  Prescriptions prior to admission  Medication Sig Dispense Refill  . aspirin EC 81 MG tablet Take 81 mg by mouth daily.        Marland Kitchen atorvastatin (LIPITOR) 40 MG tablet Take 40 mg by mouth daily.        Marland Kitchen azaTHIOprine (IMURAN) 50 MG tablet Take 75 mg by mouth daily.        . Cholecalciferol (VITAMIN D PO) Take 1 capsule by mouth daily.        . colesevelam (WELCHOL) 625 MG tablet Take 3,750 mg by mouth daily. Takes 6 tabs daily      . doxazosin (CARDURA) 8 MG tablet Take 8 mg by mouth 2 (two) times daily.        . famotidine (PEPCID) 20 MG tablet Take 20 mg by mouth daily.        . hydrALAZINE (APRESOLINE) 25 MG tablet Take 75 mg by mouth 3 (three) times daily.        . isosorbide dinitrate (ISORDIL) 40 MG tablet Take 40 mg by mouth 3 (three) times daily.        . paricalcitol (ZEMPLAR) 1 MCG capsule Take 1 mcg by mouth See admin instructions. Takes on Mon, Wed & Fri       . predniSONE (DELTASONE) 10 MG tablet Take 10 mg by mouth daily.        . sodium  bicarbonate 650 MG tablet Take 2 tablets (1,300 mg total) by mouth 2 (two) times daily.  30 tablet  0  . tacrolimus (PROGRAF) 1 MG capsule Take 1 mg by mouth 2 (two) times daily.        Marland Kitchen torsemide (DEMADEX) 100 MG tablet Take 1 tablet (100 mg total) by mouth 3 (three) times daily.  30 tablet  0  . warfarin (COUMADIN) 2.5 MG tablet Take 2.5 mg by mouth daily.        Scheduled:    . antiseptic oral rinse  15 mL Mouth Rinse BID  . aspirin EC  81 mg Oral Daily  . atorvastatin  40 mg Oral q1800  . azaTHIOprine  75 mg Oral Daily  . carvedilol  50 mg Oral BID WC  . colesevelam  3,750 mg Oral Daily  . darbepoetin (ARANESP) injection - DIALYSIS  100 mcg Intravenous Q Mon-HD  . doxazosin  8 mg Oral Q12H  . famotidine  20 mg Oral Daily  .  insulin aspart  0-5 Units Subcutaneous QHS  . insulin aspart  0-9 Units Subcutaneous TID WC  . insulin glargine  25 Units Subcutaneous Daily  . isosorbide dinitrate  40 mg Oral BID  . paricalcitol      . paricalcitol  1 mcg Intravenous 3 times weekly  . predniSONE  10 mg Oral QAC breakfast  . tacrolimus  1 mg Oral BID  . warfarin  2.5 mg Oral ONCE-1800  . DISCONTD: hydrALAZINE  25 mg Oral TID  . DISCONTD: torsemide  100 mg Oral TID    Assessment: 65 yo M admitted 11/19 with increasing weakness, failure of renal transplant with HD restart this admission.  He has a recently diagnoses LLE DVT treated with warfarin   Pharmacy Problem List: DVT: Warfarin 2.5 mg daily PTA, INR at goal Renal Transplant: Azathioprine, Tacrolimus CAD s/p CABG/HTN/HLD: ASA, ISORDIL, hydralazine, doxazosin, atorvastatin, coreg, clonidine, colesevelam DM: SSI, Lantus  Goal of Therapy:  INR 2-3   Plan:  1. Warfarin 2.5 mg x 1, INR in am.  Recommend 2.5 daily as discharge dose as was his home regimen PTA.  Lovenia Kim Pharm.D., BCPS 04/19/2011 1:43 PM [phone]    Duilio Heritage Merrily Brittle 04/19/2011,1:33 PM

## 2011-04-19 NOTE — Progress Notes (Signed)
Subjective: No current complaints.  Objective: Vital signs in last 24 hours: Temp:  [97.4 F (36.3 C)-98.7 F (37.1 C)] 97.4 F (36.3 C) (11/23 0832) Pulse Rate:  [64-80] 73  (11/23 1000) Resp:  [16-20] 18  (11/23 1000) BP: (137-174)/(70-90) 165/84 mmHg (11/23 1000) SpO2:  [91 %-97 %] 95 % (11/23 0832) Weight:  [72.8 kg (160 lb 7.9 oz)-74.4 kg (164 lb 0.4 oz)] 164 lb 0.4 oz (74.4 kg) (11/23 0832) Weight change: -1.4 kg (-3 lb 1.4 oz) Last BM Date: 04/15/11  Intake/Output from previous day: 11/22 0701 - 11/23 0700 In: 600 [P.O.:600] Out: -      Physical Exam: General: Alert, awake, oriented x3, in no acute distress. HEENT: No bruits, no goiter. Heart: Regular rate and rhythm, without murmurs, rubs, gallops. Lungs: Clear to auscultation bilaterally. Abdomen: Soft, nontender, nondistended, positive bowel sounds. Extremities: 1+ upper extremity edema. Neuro: Grossly intact, nonfocal.    Lab Results: Basic Metabolic Panel:  Basename 04/19/11 0930 04/18/11 0535  NA 138 139  K 3.3* 3.5  CL 104 101  CO2 25 28  GLUCOSE 138* 88  BUN 30* 21  CREATININE 3.50* 2.61*  CALCIUM 8.2* 7.7*  MG -- --  PHOS 2.9 2.5   Liver Function Tests:  Basename 04/19/11 0930 04/18/11 0535  AST -- --  ALT -- --  ALKPHOS -- --  BILITOT -- --  PROT -- --  ALBUMIN 2.4* 2.4*   CBC:  Basename 04/19/11 0930 04/17/11 0933  WBC 3.6* 4.2  NEUTROABS -- --  HGB 8.6* 9.5*  HCT 27.3* 30.2*  MCV 99.6 98.7  PLT 115* 113*   CBG:  Basename 04/19/11 0743 04/18/11 2136 04/18/11 1746 04/18/11 1216 04/18/11 0738 04/17/11 2127  GLUCAP 101* 171* 145* 167* 85 163*   Coagulation:  Basename 04/18/11 0535 04/17/11 0933  LABPROT 29.2* 32.8*  INR 2.71* 3.15*    Studies/Results: No results found.  Medications: Scheduled Meds:    . antiseptic oral rinse  15 mL Mouth Rinse BID  . aspirin EC  81 mg Oral Daily  . atorvastatin  40 mg Oral q1800  . azaTHIOprine  75 mg Oral Daily  . carvedilol   50 mg Oral BID WC  . colesevelam  3,750 mg Oral Daily  . darbepoetin (ARANESP) injection - DIALYSIS  100 mcg Intravenous Q Mon-HD  . doxazosin  8 mg Oral Q12H  . famotidine  20 mg Oral Daily  . insulin aspart  0-5 Units Subcutaneous QHS  . insulin aspart  0-9 Units Subcutaneous TID WC  . insulin glargine  25 Units Subcutaneous Daily  . isosorbide dinitrate  40 mg Oral BID  . paricalcitol      . paricalcitol  1 mcg Intravenous 3 times weekly  . predniSONE  10 mg Oral QAC breakfast  . tacrolimus  1 mg Oral BID  . warfarin  2.5 mg Oral ONCE-1800  . DISCONTD: hydrALAZINE  25 mg Oral TID  . DISCONTD: torsemide  100 mg Oral TID   Continuous Infusions:  PRN Meds:.sodium chloride, sodium chloride, acetaminophen, acetaminophen, calcium carbonate (dosed in mg elemental calcium), camphor-menthol, cloNIDine, docusate sodium, feeding supplement (NEPRO CARB STEADY), feeding supplement (NEPRO CARB STEADY), heparin, heparin, heparin, hydrOXYzine, lidocaine, lidocaine-prilocaine, pentafluoroprop-tetrafluoroeth, promethazine, promethazine, sorbitol, zolpidem  Assessment/Plan:  Principal Problem:  *Uremia syndrome Active Problems:  DVT (deep venous thrombosis)  Coagulopathy  Heart transplanted  Renal transplant failure and rejection  Dyslipidemia  Fever  HTN (hypertension), malignant  DM type 2 causing complication  CKD (chronic  kidney disease), stage V  #1 chronic kidney disease stage V: Newly started this admission on hemodialysis having failed renal transplantation in 2008. Outpatient clipping process complete. Per nephrology can discharge tomorrow after hemodialysis session in the morning.  #2 history of DVT: He maintains on chronic anticoagulation with Coumadin. His INR is therapeutic at this time. Pharmacy is dosing.  #3 heart transplant: Continue immune suppression drugs.  #4 fever: He is currently afebrile. Urine culture is negative. Currently not on antibiotics.  #5 hypertension:  This is improving with hemodialysis.  #6 type 2 diabetes mellitus: Well controlled.  #7 disposition: Discharge home in the morning after dialysis.  #8 chronic systolic CHF: Ejection fraction 40-45%. He is status post a heart transplantation.   LOS: 4 days   HERNANDEZ ACOSTA,ESTELA 04/19/2011, 10:53 AM

## 2011-04-19 NOTE — Discharge Summary (Signed)
Physician Discharge Summary  Patient ID: Brandon Sharp MRN: [id number] DOB/AGE: 1946-03-20 65 y.o.  Admit date: 04/15/2011 Discharge date: 04/19/2011  Primary Care Physician:  Eartha Inch, MD   Discharge Diagnoses:    Principal Problem:  *Uremia syndrome Active Problems:  DVT (deep venous thrombosis)  Coagulopathy  Heart transplanted  Renal transplant failure and rejection  Dyslipidemia  Fever  HTN (hypertension), malignant  DM type 2 causing complication  CKD (chronic kidney disease), stage V    Current Discharge Medication List    START taking these medications   Details  carvedilol (COREG) 25 MG tablet Take 2 tablets (50 mg total) by mouth 2 (two) times daily with a meal. Qty: 120 tablet, Refills: 1    insulin glargine (LANTUS) 100 UNIT/ML injection Inject 25 Units into the skin daily. Qty: 10 mL, Refills: 2      CONTINUE these medications which have NOT CHANGED   Details  aspirin EC 81 MG tablet Take 81 mg by mouth daily.      atorvastatin (LIPITOR) 40 MG tablet Take 40 mg by mouth daily.      azaTHIOprine (IMURAN) 50 MG tablet Take 75 mg by mouth daily.      Cholecalciferol (VITAMIN D PO) Take 1 capsule by mouth daily.      colesevelam (WELCHOL) 625 MG tablet Take 3,750 mg by mouth daily. Takes 6 tabs daily    doxazosin (CARDURA) 8 MG tablet Take 8 mg by mouth 2 (two) times daily.      famotidine (PEPCID) 20 MG tablet Take 20 mg by mouth daily.      hydrALAZINE (APRESOLINE) 25 MG tablet Take 75 mg by mouth 3 (three) times daily.      isosorbide dinitrate (ISORDIL) 40 MG tablet Take 40 mg by mouth 3 (three) times daily.      paricalcitol (ZEMPLAR) 1 MCG capsule Take 1 mcg by mouth See admin instructions. Takes on Mon, Wed & Fri     predniSONE (DELTASONE) 10 MG tablet Take 10 mg by mouth daily.      tacrolimus (PROGRAF) 1 MG capsule Take 1 mg by mouth 2 (two) times daily.      warfarin (COUMADIN) 2.5 MG tablet Take 2.5 mg by mouth daily.       STOP taking these medications     sodium bicarbonate 650 MG tablet      torsemide (DEMADEX) 100 MG tablet          Disposition and Follow-up:  Patient will be discharged home today in stable and improved condition. He has been started on hemodialysis and will be visiting the [facility] dialysis unit on Mondays, Wednesdays and Fridays.  Consults:  nephrology    Significant Diagnostic Studies:  Dg Chest 2 View  04/15/2011  *RADIOLOGY REPORT*  Clinical Data: Increasing weakness and  fatigue.  Crackles of the lung bases.  History of heart and renal transplants.  Lower extremity swelling.  CHEST - 2 VIEW  Comparison: 04/11/2011  Findings: Chronic cardiomegaly.  Pulmonary vascular congestion. Interstitial edema has improved.  Tiny bilateral effusions. Increased fluid along the fissures on the right as well as fluid loculated laterally at the right base.  IMPRESSION: Improved pulmonary edema.  However, the patient now has loculated fluid at the right base laterally and along the fissures.  Original Report Authenticated By: [name], M.D.   Brief H and P: For complete details please refer to admission H and P, but in brief Brandon Sharp is a very pleasant 65 year old  black male in who has a history of a heart and renal transplant hypertension and diabetes who came to the hospital after being seen at Dr. Marland Mcalpine office for generalized weakness and fatigue that have been worsening over the past few weeks, he also had increased lower extremity edema. [name] believe that it was probably time to go ahead and start dialysis. Because of this we are asked to admit him for further evaluation and management.    Hospital Course:  Principal Problem:  *Uremia syndrome Active Problems:  DVT (deep venous thrombosis)  Coagulopathy  Heart transplanted  Renal transplant failure and rejection  Dyslipidemia  Fever  HTN (hypertension), malignant  DM type 2 causing complication  CKD (chronic kidney  disease), stage V  #1 chronic kidney disease stage V now end-stage renal disease with hemodialysis on Mondays Wednesdays and Fridays. Of note patient had a renal transplant in 2008 that has failed and has been placed back on dialysis this admission. He has done well with dialysis and nephrology has been following him in the hospital. He has now been clipped and has a slot for outpatient dialysis.  #2 history of DVT: He has been maintained on chronic anticoagulation with Coumadin. His INR is therapeutic at time of discharge.  #3 heart transplant: Continue immune suppressant drugs.  #4 hypertension: This is improving with hemodialysis.  #5 diabetes mellitus: Well controlled.  #6 chronic systolic CHF: Ejection fraction of 40-45%. He is status post a heart transplantation in 2001.  Time spent on Discharge: Greater than 30 minutes.  SignedChaya Jan 04/19/2011, 2:11 PM

## 2011-04-19 NOTE — Progress Notes (Signed)
Received a call from [facility] they can start pt on Monday 04/22/11 at 1130am. Pt  Given instructions to be there. Pt verbalizes understanding. Call placed to Dr. Lowell Guitar to let him know they have a spot for him on M-W-F.Call placed to attending to let them know about this change.

## 2011-04-19 NOTE — Progress Notes (Signed)
S:Feels better O:BP 163/78  Pulse 74  Temp(Src) 97.4 F (36.3 C) (Oral)  Resp 20  Ht 6' (1.829 m)  Wt 74.4 kg (164 lb 0.4 oz)  BMI 22.25 kg/m2  SpO2 95%  Intake/Output Summary (Last 24 hours) at 04/19/11 0850 Last data filed at 04/18/11 1800  Gross per 24 hour  Intake    600 ml  Output      0 ml  Net    600 ml   Weight change: -1.4 kg (-3 lb 1.4 oz) ZOX:WRUEA better CVS:RRR Resp:clear VWU:JWJX BJY:NWG9+,FAO1    . antiseptic oral rinse  15 mL Mouth Rinse BID  . aspirin EC  81 mg Oral Daily  . atorvastatin  40 mg Oral q1800  . azaTHIOprine  75 mg Oral Daily  . carvedilol  50 mg Oral BID WC  . colesevelam  3,750 mg Oral Daily  . darbepoetin (ARANESP) injection - DIALYSIS  100 mcg Intravenous Q Mon-HD  . doxazosin  8 mg Oral Q12H  . famotidine  20 mg Oral Daily  . hydrALAZINE  25 mg Oral TID  . insulin aspart  0-5 Units Subcutaneous QHS  . insulin aspart  0-9 Units Subcutaneous TID WC  . insulin glargine  25 Units Subcutaneous Daily  . isosorbide dinitrate  40 mg Oral BID  . paricalcitol  1 mcg Intravenous 3 times weekly  . predniSONE  10 mg Oral QAC breakfast  . tacrolimus  1 mg Oral BID  . torsemide  100 mg Oral TID  . warfarin  2.5 mg Oral ONCE-1800   No results found. BMET  Lab 04/18/11 0535 04/17/11 0933 04/16/11 0920 04/15/11 1112  NA 139 141 141 146*  K 3.5 3.3* 3.6 4.2  CL 101 104 109 115*  CO2 28 29 20  17*  GLUCOSE 88 92 162* 90  BUN 21 31* 49* 80*  CREATININE 2.61* 2.96* 3.60* 4.68*  ALB -- -- -- --  CALCIUM 7.7* 8.4 8.3* 8.6  PHOS 2.5 -- 3.1 --   CBC  Lab 04/17/11 0933 04/16/11 0920 04/15/11 1112  WBC 4.2 4.3 3.6*  NEUTROABS -- -- --  HGB 9.5* 9.1* 9.4*  HCT 30.2* 28.5* 29.3*  MCV 98.7 97.9 99.7  PLT 113* 105* 135*  Assessment/Plan:  1.Volume overload/Anasarca: Improving on dialysis. Plan for dialysis again tomorrow.  2.ESRD:Hemodialysis in progress.  Need outpatient schedule regarding treatment. 3. Anemia:On Aranesp. No overt losses.    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  5.Hypoalbuminemic 6. Hypertension:Fair control, anticipate to improve with HD/UF 7.Orthopic Heart transplant 8. DVT  Plan:1)   I spoke with [facility] office today.  Out patient HD status=  Hemodialysis to be done at [facility] on TTS second shift. Arrive at 11 am for paper work 2 Stop Hydralazine 3 He will need to get inpatient hemodialysis early Sat and then discharge   Jordyan Hardiman C

## 2011-05-03 ENCOUNTER — Other Ambulatory Visit (HOSPITAL_COMMUNITY): Payer: Self-pay | Admitting: Nephrology

## 2011-05-03 DIAGNOSIS — N186 End stage renal disease: Secondary | ICD-10-CM

## 2011-05-04 ENCOUNTER — Other Ambulatory Visit (HOSPITAL_COMMUNITY): Payer: Self-pay | Admitting: Nephrology

## 2011-05-04 ENCOUNTER — Encounter (HOSPITAL_COMMUNITY): Payer: Self-pay

## 2011-05-04 ENCOUNTER — Ambulatory Visit (HOSPITAL_COMMUNITY)
Admission: RE | Admit: 2011-05-04 | Discharge: 2011-05-04 | Disposition: A | Discharge status: Home / Self Care | Payer: Medicare Other | Source: Ambulatory Visit | Attending: Nephrology | Admitting: Nephrology

## 2011-05-04 VITALS — BP 160/60 | HR 72 | Resp 14

## 2011-05-04 DIAGNOSIS — N186 End stage renal disease: Secondary | ICD-10-CM | POA: Insufficient documentation

## 2011-05-04 DIAGNOSIS — Z992 Dependence on renal dialysis: Secondary | ICD-10-CM | POA: Insufficient documentation

## 2011-05-04 DIAGNOSIS — E119 Type 2 diabetes mellitus without complications: Secondary | ICD-10-CM | POA: Insufficient documentation

## 2011-05-04 DIAGNOSIS — I2510 Atherosclerotic heart disease of native coronary artery without angina pectoris: Secondary | ICD-10-CM | POA: Insufficient documentation

## 2011-05-04 DIAGNOSIS — T82898A Other specified complication of vascular prosthetic devices, implants and grafts, initial encounter: Secondary | ICD-10-CM | POA: Insufficient documentation

## 2011-05-04 DIAGNOSIS — I509 Heart failure, unspecified: Secondary | ICD-10-CM | POA: Insufficient documentation

## 2011-05-04 DIAGNOSIS — I120 Hypertensive chronic kidney disease with stage 5 chronic kidney disease or end stage renal disease: Secondary | ICD-10-CM | POA: Insufficient documentation

## 2011-05-04 DIAGNOSIS — Y849 Medical procedure, unspecified as the cause of abnormal reaction of the patient, or of later complication, without mention of misadventure at the time of the procedure: Secondary | ICD-10-CM | POA: Insufficient documentation

## 2011-05-04 MED ORDER — MIDAZOLAM HCL 2 MG/2ML IJ SOLN
INTRAMUSCULAR | Status: AC
  Filled 2011-05-04: qty 4

## 2011-05-04 MED ORDER — MIDAZOLAM HCL 2 MG/2ML IJ SOLN
1.0000 mg | INTRAMUSCULAR | Status: DC | PRN
  Administered 2011-05-04: 2 mg via INTRAVENOUS
  Administered 2011-05-04: 0.5 mg via INTRAVENOUS
  Filled 2011-05-04: qty 10

## 2011-05-04 MED ORDER — FENTANYL CITRATE 0.05 MG/ML IJ SOLN
25.0000 ug | INTRAMUSCULAR | Status: DC | PRN
  Administered 2011-05-04: 25 ug via INTRAVENOUS
  Administered 2011-05-04: 100 ug via INTRAVENOUS
  Administered 2011-05-04: 25 ug via INTRAVENOUS

## 2011-05-04 MED ORDER — IOHEXOL 300 MG/ML  SOLN: 80.0000 mL | Freq: Once | INTRAMUSCULAR | Status: AC | PRN

## 2011-05-04 MED ORDER — FENTANYL CITRATE 0.05 MG/ML IJ SOLN
INTRAMUSCULAR | Status: AC
  Filled 2011-05-04: qty 4

## 2011-05-04 MED ORDER — HEPARIN SODIUM (PORCINE) 1000 UNIT/ML IJ SOLN
INTRAMUSCULAR | Status: AC
  Filled 2011-05-04: qty 1

## 2011-05-04 NOTE — H&P (Signed)
Brandon Sharp is an 65 y.o. male.   Chief Complaint: Right arm graft stenosis HPI: Rt arm graft angioplasty/stent placement With possible dialysis catheter placement  Past Medical History  Diagnosis Date  . Renal failure   . Diabetes mellitus   . Hypertension   . Myocardial infarction 1985; 1990  . Angina   . Coronary artery disease   . Dysrhythmia   . CHF (congestive heart failure)   . DVT (deep venous thrombosis) ~ 12/2010    LLE  . Anemia   . Blood transfusion 06/1999    post heart transplant    Past Surgical History  Procedure Date  . Nephrectomy transplanted organ   . Av fistula repair     rt. a=fore arm fistula  . Av fistula placement ~ 01/2010    "this was my 2nd fistula placement"  . Heart transplant 06/1999  . Coronary angioplasty with stent placement 1985; 1990  . Coronary artery bypass graft 1990    CABG X3    No family history on file. Social History:  reports that he quit smoking about 22 years ago. His smoking use included Cigarettes. He has a 20 pack-year smoking history. He has never used smokeless tobacco. He reports that he drinks alcohol. He reports that he does not use illicit drugs.  Allergies:  Allergies  Allergen Reactions  . Lisinopril Swelling    Lips and tongue swell  . Norvasc (Amlodipine Besylate) Rash    Flushing  . Penicillins Rash    Medications Prior to Admission  Medication Sig Dispense Refill  . aspirin EC 81 MG tablet Take 81 mg by mouth daily.        Marland Kitchen atorvastatin (LIPITOR) 40 MG tablet Take 40 mg by mouth daily.        Marland Kitchen azaTHIOprine (IMURAN) 50 MG tablet Take 75 mg by mouth daily.        . carvedilol (COREG) 25 MG tablet Take 2 tablets (50 mg total) by mouth 2 (two) times daily with a meal.  120 tablet  1  . Cholecalciferol (VITAMIN D PO) Take 1 capsule by mouth daily.        . colesevelam (WELCHOL) 625 MG tablet Take 3,750 mg by mouth daily. Takes 6 tabs daily      . doxazosin (CARDURA) 8 MG tablet Take 8 mg by mouth 2 (two)  times daily.        . famotidine (PEPCID) 20 MG tablet Take 20 mg by mouth daily.        . hydrALAZINE (APRESOLINE) 25 MG tablet Take 75 mg by mouth 3 (three) times daily.        . insulin glargine (LANTUS) 100 UNIT/ML injection Inject 25 Units into the skin daily.  10 mL  2  . isosorbide dinitrate (ISORDIL) 40 MG tablet Take 40 mg by mouth 3 (three) times daily.        . paricalcitol (ZEMPLAR) 1 MCG capsule Take 1 mcg by mouth See admin instructions. Takes on Mon, Wed & Fri       . predniSONE (DELTASONE) 10 MG tablet Take 10 mg by mouth daily.        . tacrolimus (PROGRAF) 1 MG capsule Take 1 mg by mouth 2 (two) times daily.        Marland Kitchen warfarin (COUMADIN) 2.5 MG tablet Take 2.5 mg by mouth daily.        No current facility-administered medications on file as of 05/04/2011.    No results found  for this or any previous visit (from the past 48 hour(s)). No results found.  Review of Systems  Constitutional: Negative for fever.  Respiratory: Negative for cough.   Cardiovascular: Negative for chest pain.  Gastrointestinal: Negative for nausea, vomiting, abdominal pain and diarrhea.  Neurological: Negative for headaches.    There were no vitals taken for this visit. Physical Exam  Constitutional: He is oriented to person, place, and time. He appears well-developed and well-nourished.  HENT:  Head: Normocephalic.  Eyes: EOM are normal.  Neck: Normal range of motion.  Cardiovascular: Normal rate, regular rhythm and normal heart sounds.   No murmur heard. Respiratory: Effort normal and breath sounds normal. He has no wheezes.  GI: Soft. Bowel sounds are normal. He exhibits no mass. There is no tenderness.  Musculoskeletal: Normal range of motion.       Uses cane   Neurological: He is alert and oriented to person, place, and time.  Skin: Skin is warm and dry.     Assessment/Plan Rt arm graft stenosis for pta/stent  And/or dialysis catheter placement Pt aware of procedure benefits and  risks and agreeable to proceed. Consent signed.  Dlisa Barnwell A 05/04/2011, 10:35 AM

## 2011-05-06 ENCOUNTER — Ambulatory Visit (HOSPITAL_COMMUNITY): Payer: Medicare Other

## 2011-06-19 ENCOUNTER — Other Ambulatory Visit: Payer: Self-pay | Admitting: Internal Medicine

## 2011-07-25 ENCOUNTER — Ambulatory Visit
Admission: RE | Admit: 2011-07-25 | Discharge: 2011-07-25 | Disposition: A | Discharge status: Home / Self Care | Payer: Medicare Other | Source: Ambulatory Visit | Attending: Nephrology | Admitting: Nephrology

## 2011-07-25 ENCOUNTER — Other Ambulatory Visit: Payer: Self-pay | Admitting: Nephrology

## 2011-07-25 DIAGNOSIS — R52 Pain, unspecified: Secondary | ICD-10-CM

## 2011-08-02 ENCOUNTER — Encounter (HOSPITAL_COMMUNITY): Payer: Self-pay | Admitting: Emergency Medicine

## 2011-08-02 ENCOUNTER — Emergency Department (HOSPITAL_COMMUNITY)
Admission: EM | Admit: 2011-08-02 | Discharge: 2011-08-02 | Disposition: A | Discharge status: Home / Self Care | Payer: Medicare Other | Attending: Emergency Medicine | Admitting: Emergency Medicine

## 2011-08-02 DIAGNOSIS — Z9861 Coronary angioplasty status: Secondary | ICD-10-CM | POA: Insufficient documentation

## 2011-08-02 DIAGNOSIS — I10 Essential (primary) hypertension: Secondary | ICD-10-CM | POA: Insufficient documentation

## 2011-08-02 DIAGNOSIS — R233 Spontaneous ecchymoses: Secondary | ICD-10-CM | POA: Insufficient documentation

## 2011-08-02 DIAGNOSIS — Z7901 Long term (current) use of anticoagulants: Secondary | ICD-10-CM | POA: Insufficient documentation

## 2011-08-02 DIAGNOSIS — Z87891 Personal history of nicotine dependence: Secondary | ICD-10-CM | POA: Insufficient documentation

## 2011-08-02 DIAGNOSIS — Y841 Kidney dialysis as the cause of abnormal reaction of the patient, or of later complication, without mention of misadventure at the time of the procedure: Secondary | ICD-10-CM | POA: Insufficient documentation

## 2011-08-02 DIAGNOSIS — Z940 Kidney transplant status: Secondary | ICD-10-CM | POA: Insufficient documentation

## 2011-08-02 DIAGNOSIS — I509 Heart failure, unspecified: Secondary | ICD-10-CM | POA: Insufficient documentation

## 2011-08-02 DIAGNOSIS — T792XXA Traumatic secondary and recurrent hemorrhage and seroma, initial encounter: Secondary | ICD-10-CM | POA: Insufficient documentation

## 2011-08-02 DIAGNOSIS — E119 Type 2 diabetes mellitus without complications: Secondary | ICD-10-CM | POA: Insufficient documentation

## 2011-08-02 DIAGNOSIS — I2510 Atherosclerotic heart disease of native coronary artery without angina pectoris: Secondary | ICD-10-CM | POA: Insufficient documentation

## 2011-08-02 DIAGNOSIS — T148XXA Other injury of unspecified body region, initial encounter: Secondary | ICD-10-CM

## 2011-08-02 DIAGNOSIS — Z794 Long term (current) use of insulin: Secondary | ICD-10-CM | POA: Insufficient documentation

## 2011-08-02 DIAGNOSIS — Z941 Heart transplant status: Secondary | ICD-10-CM | POA: Insufficient documentation

## 2011-08-02 NOTE — ED Notes (Addendum)
Pt sent to ER from dialysis center after right arm fistula would not stop bleeding today after dialysis. Venous site bleed for 1 hour, gel foam applied at 1635. Received full tx of dialysis.

## 2011-08-02 NOTE — ED Provider Notes (Signed)
History     CSN: [id number]  Arrival date & time 08/02/11  Brandon Sharp   First MD Initiated Contact with Patient 08/02/11 1835      Chief Complaint  Patient presents with  . Vascular Access Problem    (Consider location/radiation/quality/duration/timing/severity/associated sxs/prior treatment) HPI Comments: Patient presents today from diet his dialysis Center where he received his normal course of dialysis but had some persistent bleeding once they removed the needles.  Patient is not on Coumadin at this time.  He otherwise feels well and has no lightheadedness or dizziness.  He does note that he normally takes him about 20 minutes to stop bleeding.  The bleeding started at approximately 3:30.  They sent him over here approximately 6:30 and had put a new dressing in place at 5:30 with a pressure device.  Patient has not seen any new signs of bleeding since his transfer over here.  No other specific inciting or relieving factors.  Patient is a 66 y.o. male presenting with wound check. The history is provided by the patient. No language interpreter was used.  Wound Check     Past Medical History  Diagnosis Date  . Renal failure   . Diabetes mellitus   . Hypertension   . Myocardial infarction 1985; 1990  . Angina   . Coronary artery disease   . Dysrhythmia   . CHF (congestive heart failure)   . DVT (deep venous thrombosis) ~ 12/2010    LLE  . Anemia   . Blood transfusion 06/1999    post heart transplant    Past Surgical History  Procedure Date  . Nephrectomy transplanted organ   . Av fistula repair     rt. a=fore arm fistula  . Av fistula placement ~ 01/2010    "this was my 2nd fistula placement"  . Heart transplant 06/1999  . Coronary angioplasty with stent placement 1985; 1990  . Coronary artery bypass graft 1990    CABG X3    History reviewed. No pertinent family history.  History  Substance Use Topics  . Smoking status: Former Smoker -- 1.0 packs/day for 20 years   Types: Cigarettes    Quit date: 05/27/1988  . Smokeless tobacco: Never Used  . Alcohol Use: Yes     "Holidays"      Review of Systems  Constitutional: Negative.  Negative for fever and chills.  HENT: Negative.   Eyes: Negative.  Negative for discharge and redness.  Respiratory: Negative.  Negative for cough and shortness of breath.   Cardiovascular: Negative.  Negative for chest pain.  Gastrointestinal: Negative.  Negative for nausea, vomiting and abdominal pain.  Genitourinary: Negative.  Negative for hematuria.  Musculoskeletal: Negative.  Negative for back pain.  Skin: Negative.  Negative for color change and rash.  Neurological: Negative for syncope and headaches.  Hematological: Negative for adenopathy. Bruises/bleeds easily.  Psychiatric/Behavioral: Negative.  Negative for confusion.  All other systems reviewed and are negative.    Allergies  Lisinopril; Norvasc; and Penicillins  Home Medications   Current Outpatient Rx  Name Route Sig Dispense Refill  . ASPIRIN EC 81 MG PO TBEC Oral Take 81 mg by mouth daily.      . ATORVASTATIN CALCIUM 40 MG PO TABS Oral Take 40 mg by mouth daily.      . AZATHIOPRINE 50 MG PO TABS Oral Take 75 mg by mouth daily.      Marland Kitchen CARVEDILOL 25 MG PO TABS Oral Take 50 mg by mouth 2 (two) times  daily with a meal.    . VITAMIN D PO Oral Take 1 capsule by mouth daily.      . COLESEVELAM HCL 625 MG PO TABS Oral Take 3,750 mg by mouth daily. Takes 6 tabs daily    . FAMOTIDINE 20 MG PO TABS Oral Take 20 mg by mouth daily.      . INSULIN GLARGINE 100 UNIT/ML [place] SOLN Subcutaneous Inject 25 Units into the skin daily.    . ISOSORBIDE DINITRATE 40 MG PO TABS Oral Take 40 mg by mouth 3 (three) times daily.      Marland Kitchen PREDNISONE 10 MG PO TABS Oral Take 10 mg by mouth daily.      Marland Kitchen TACROLIMUS 1 MG PO CAPS Oral Take 1 mg by mouth 2 (two) times daily.        BP 136/62  Pulse 76  Temp(Src) 96.9 F (36.1 C) (Oral)  Resp 18  SpO2 100%  Physical Exam    Nursing note and vitals reviewed. Constitutional: He is oriented to person, place, and time. He appears well-developed and well-nourished.  Non-toxic appearance. He does not have a sickly appearance.  HENT:  Head: Normocephalic and atraumatic.  Eyes: Conjunctivae, EOM and lids are normal. Pupils are equal, round, and reactive to light.  Neck: Trachea normal, normal range of motion and full passive range of motion without pain. Neck supple.  Cardiovascular: Normal rate, regular rhythm and normal heart sounds.   Pulmonary/Chest: Effort normal and breath sounds normal. No respiratory distress.  Abdominal: Soft. Normal appearance. He exhibits no distension. There is no tenderness. There is no rebound and no CVA tenderness.  Musculoskeletal: Normal range of motion.       Right upper arm has a fistula present.  Palpable thrill present.  Dressings are in place with no signs of bleeding.  Palpable radial pulse.  Capillary refill in his fingertips less than 2 seconds.  Neurological: He is alert and oriented to person, place, and time. He has normal strength.  Skin: Skin is warm, dry and intact. No rash noted.  Psychiatric: He has a normal mood and affect. His behavior is normal. Judgment and thought content normal.    ED Course  Procedures (including critical care time)  Labs Reviewed - No data to display No results found.   1. Bleeding from wound       MDM  Patient with bleeding at his dialysis site that his remained controlled for over an hour here in the emergency department.  Patient feels comfortable going home at this point in time.  He understands to put pressure on the wound if he has any recurrent bleeding.  He knows to keep the dressing in place until tomorrow as well.        Nat Christen, MD 08/02/11 [phone]

## 2011-08-02 NOTE — Discharge Instructions (Signed)
Please continue to keep the dressing in place until tomorrow.  Return for any further worsening problems with bleeding.  If you experience bleeding at home your first action should be to apply pressure without stopping for least 15 minutes.

## 2011-08-02 NOTE — ED Notes (Signed)
Patient given discharge paperwork; went over discharge instructions with patient.  Patient instructed to follow up with Dr. Cyndia Bent as directed, to keep dressing in place for 24 hours, to apply pressure for 15 minutes if bleeding occurs again, and to return to the ED for new, worsening, or concerning symptoms.

## 2011-08-30 ENCOUNTER — Other Ambulatory Visit (HOSPITAL_COMMUNITY): Payer: Self-pay | Admitting: Nephrology

## 2011-08-30 DIAGNOSIS — N186 End stage renal disease: Secondary | ICD-10-CM

## 2011-09-02 ENCOUNTER — Encounter (HOSPITAL_COMMUNITY): Payer: Self-pay | Admitting: Pharmacy Technician

## 2011-09-03 ENCOUNTER — Ambulatory Visit (HOSPITAL_COMMUNITY)
Admission: RE | Admit: 2011-09-03 | Discharge: 2011-09-03 | Disposition: A | Discharge status: Home / Self Care | Payer: Medicare Other | Source: Ambulatory Visit | Attending: Nephrology | Admitting: Nephrology

## 2011-09-03 ENCOUNTER — Encounter (HOSPITAL_COMMUNITY): Payer: Self-pay

## 2011-09-03 ENCOUNTER — Other Ambulatory Visit (HOSPITAL_COMMUNITY): Payer: Self-pay | Admitting: Nephrology

## 2011-09-03 DIAGNOSIS — I509 Heart failure, unspecified: Secondary | ICD-10-CM | POA: Insufficient documentation

## 2011-09-03 DIAGNOSIS — E119 Type 2 diabetes mellitus without complications: Secondary | ICD-10-CM | POA: Insufficient documentation

## 2011-09-03 DIAGNOSIS — N186 End stage renal disease: Secondary | ICD-10-CM

## 2011-09-03 DIAGNOSIS — Z941 Heart transplant status: Secondary | ICD-10-CM | POA: Insufficient documentation

## 2011-09-03 DIAGNOSIS — I871 Compression of vein: Secondary | ICD-10-CM | POA: Insufficient documentation

## 2011-09-03 DIAGNOSIS — I129 Hypertensive chronic kidney disease with stage 1 through stage 4 chronic kidney disease, or unspecified chronic kidney disease: Secondary | ICD-10-CM | POA: Insufficient documentation

## 2011-09-03 DIAGNOSIS — Z86718 Personal history of other venous thrombosis and embolism: Secondary | ICD-10-CM | POA: Insufficient documentation

## 2011-09-03 DIAGNOSIS — N19 Unspecified kidney failure: Secondary | ICD-10-CM | POA: Insufficient documentation

## 2011-09-03 DIAGNOSIS — Y832 Surgical operation with anastomosis, bypass or graft as the cause of abnormal reaction of the patient, or of later complication, without mention of misadventure at the time of the procedure: Secondary | ICD-10-CM | POA: Insufficient documentation

## 2011-09-03 DIAGNOSIS — T82898A Other specified complication of vascular prosthetic devices, implants and grafts, initial encounter: Secondary | ICD-10-CM | POA: Insufficient documentation

## 2011-09-03 MED ORDER — IOHEXOL 300 MG/ML  SOLN
100.0000 mL | Freq: Once | INTRAMUSCULAR | Status: AC | PRN
  Administered 2011-09-03: 60 mL via INTRAVENOUS

## 2011-09-03 MED ORDER — HEPARIN SODIUM (PORCINE) 1000 UNIT/ML IJ SOLN
INTRAMUSCULAR | Status: AC
  Filled 2011-09-03: qty 1

## 2011-09-03 MED ORDER — MIDAZOLAM HCL 5 MG/5ML IJ SOLN
INTRAMUSCULAR | Status: DC | PRN
  Administered 2011-09-03 (×2): 1 mg via INTRAVENOUS

## 2011-09-03 MED ORDER — FENTANYL CITRATE 0.05 MG/ML IJ SOLN
INTRAMUSCULAR | Status: DC | PRN
  Administered 2011-09-03 (×2): 50 ug via INTRAVENOUS

## 2011-09-03 MED ORDER — FENTANYL CITRATE 0.05 MG/ML IJ SOLN
INTRAMUSCULAR | Status: AC
  Filled 2011-09-03: qty 2

## 2011-09-03 MED ORDER — MIDAZOLAM HCL 2 MG/2ML IJ SOLN
INTRAMUSCULAR | Status: AC
  Filled 2011-09-03: qty 2

## 2011-09-03 NOTE — H&P (Signed)
Brandon Sharp is an 66 y.o. male.   Chief Complaint: Rt arm graft stenosis per shuntogram HPI: scheduled now for graft angioplasty/stent placement. Possible dialysis catheter placement if needed.   Past Medical History  Diagnosis Date  . Renal failure   . Diabetes mellitus   . Hypertension   . Myocardial infarction 1985; 1990  . Angina   . Coronary artery disease   . Dysrhythmia   . CHF (congestive heart failure)   . DVT (deep venous thrombosis) ~ 12/2010    LLE  . Anemia   . Blood transfusion 06/1999    post heart transplant    Past Surgical History  Procedure Date  . Nephrectomy transplanted organ   . Av fistula repair     rt. a=fore arm fistula  . Av fistula placement ~ 01/2010    "this was my 2nd fistula placement"  . Heart transplant 06/1999  . Coronary angioplasty with stent placement 1985; 1990  . Coronary artery bypass graft 1990    CABG X3    No family history on file. Social History:  reports that he quit smoking about 23 years ago. His smoking use included Cigarettes. He has a 20 pack-year smoking history. He has never used smokeless tobacco. He reports that he drinks alcohol. He reports that he does not use illicit drugs.  Allergies:  Allergies  Allergen Reactions  . Lisinopril Swelling    Lips and tongue swell  . Norvasc (Amlodipine Besylate) Rash    Flushing  . Penicillins Rash    Medications Prior to Admission  Medication Sig Dispense Refill  . aspirin EC 81 MG tablet Take 81 mg by mouth daily.        Marland Kitchen atorvastatin (LIPITOR) 40 MG tablet Take 40 mg by mouth daily.        Marland Kitchen azaTHIOprine (IMURAN) 50 MG tablet Take 75 mg by mouth daily.        . carvedilol (COREG) 25 MG tablet Take 50 mg by mouth 2 (two) times daily with a meal.      . Cholecalciferol (VITAMIN D PO) Take 1 capsule by mouth daily.        . colesevelam (WELCHOL) 625 MG tablet Take 3,750 mg by mouth daily. Takes 6 tabs daily      . famotidine (PEPCID) 20 MG tablet Take 20 mg by mouth  daily.        . insulin glargine (LANTUS) 100 UNIT/ML injection Inject 25 Units into the skin daily.      . isosorbide dinitrate (ISORDIL) 40 MG tablet Take 40 mg by mouth 3 (three) times daily.        . predniSONE (DELTASONE) 10 MG tablet Take 10 mg by mouth daily.        . tacrolimus (PROGRAF) 1 MG capsule Take 1 mg by mouth 2 (two) times daily.        Marland Kitchen DISCONTD: doxazosin (CARDURA) 8 MG tablet Take 8 mg by mouth 2 (two) times daily.        Marland Kitchen DISCONTD: hydrALAZINE (APRESOLINE) 25 MG tablet Take 75 mg by mouth 3 (three) times daily.        Marland Kitchen DISCONTD: warfarin (COUMADIN) 2.5 MG tablet Take 2.5 mg by mouth daily.        No current facility-administered medications on file as of 09/03/2011.    No results found for this or any previous visit (from the past 48 hour(s)). No results found.  Review of Systems  Constitutional: Negative for  fever.  Cardiovascular: Negative for chest pain.  Gastrointestinal: Negative for nausea and vomiting.    There were no vitals taken for this visit. Physical Exam  Constitutional: He is oriented to person, place, and time. He appears well-developed.  Cardiovascular: Normal rate and regular rhythm.   Respiratory: Effort normal and breath sounds normal. He has no wheezes.  GI: Soft. Bowel sounds are normal. There is no tenderness.  Musculoskeletal: Normal range of motion.  Neurological: He is alert and oriented to person, place, and time.  Skin: Skin is warm.     Assessment/Plan Rt arm graft stenosis Scheduled for angioplasty/stent placement. Possible dialysis catheter placement if necessary.  Pt aware of procedure benefits and risks and agreeable to proceed. Consent signed.  Glorie Dowlen A 09/03/2011, 8:17 AM

## 2011-09-03 NOTE — Procedures (Signed)
PTA venous 7mm and 10 mm No comp

## 2011-09-04 ENCOUNTER — Telehealth (HOSPITAL_COMMUNITY): Payer: Self-pay | Admitting: *Deleted

## 2011-09-04 NOTE — Telephone Encounter (Signed)
Doing well no problems per wife.  At dialysis now.

## 2011-11-01 ENCOUNTER — Telehealth: Payer: Self-pay | Admitting: Oncology

## 2011-11-01 NOTE — Telephone Encounter (Signed)
called pts home lmomv to rtn call to schedule appt

## 2011-11-05 ENCOUNTER — Telehealth: Payer: Self-pay | Admitting: Oncology

## 2011-11-05 NOTE — Telephone Encounter (Signed)
called pt lmovm to rtn call to schedule appt 

## 2011-11-06 ENCOUNTER — Telehealth: Payer: Self-pay | Admitting: Oncology

## 2011-11-06 NOTE — Telephone Encounter (Signed)
S/w Irving Burton @ [facility]. Due to this office not being able to reach pt for new pt appt Irving Burton called while pt was in office. Pt can only come on Tuesday and Thursday because he has dialysis on Monday, Wednesday and Friday. The wait for HH's next Tuesday or Thursday appt was too long so pt was scheduled with FS for 6/20 @ 1:30 pm. Irving Burton will contact pt with this appt d/t/location, my name and direct phone number. Irving Burton will stress to the patient that if for any reason he cannot keep this appt he will call me to r/s ASAP.

## 2011-11-12 ENCOUNTER — Telehealth: Payer: Self-pay | Admitting: Oncology

## 2011-11-12 NOTE — Telephone Encounter (Signed)
Referred by Dr. Camille Bal Dx- Mild Thrombocytopenia

## 2011-11-13 ENCOUNTER — Other Ambulatory Visit: Payer: Self-pay | Admitting: Oncology

## 2011-11-13 DIAGNOSIS — D689 Coagulation defect, unspecified: Secondary | ICD-10-CM

## 2011-11-14 ENCOUNTER — Other Ambulatory Visit (HOSPITAL_BASED_OUTPATIENT_CLINIC_OR_DEPARTMENT_OTHER): Payer: Medicare Other | Admitting: Lab

## 2011-11-14 ENCOUNTER — Telehealth: Payer: Self-pay | Admitting: Oncology

## 2011-11-14 ENCOUNTER — Ambulatory Visit (HOSPITAL_BASED_OUTPATIENT_CLINIC_OR_DEPARTMENT_OTHER): Payer: Medicare Other | Admitting: Oncology

## 2011-11-14 ENCOUNTER — Ambulatory Visit: Payer: Medicare Other

## 2011-11-14 VITALS — BP 110/61 | HR 81 | Temp 97.0°F | Ht 72.0 in | Wt 159.0 lb

## 2011-11-14 DIAGNOSIS — D7589 Other specified diseases of blood and blood-forming organs: Secondary | ICD-10-CM

## 2011-11-14 DIAGNOSIS — D689 Coagulation defect, unspecified: Secondary | ICD-10-CM

## 2011-11-14 DIAGNOSIS — D696 Thrombocytopenia, unspecified: Secondary | ICD-10-CM

## 2011-11-14 LAB — COMPREHENSIVE METABOLIC PANEL
Albumin: 3.5 g/dL (ref 3.5–5.2)
Alkaline Phosphatase: 47 U/L (ref 39–117)
Calcium: 9.4 mg/dL (ref 8.4–10.5)
Chloride: 99 mEq/L (ref 96–112)
Glucose, Bld: 114 mg/dL — ABNORMAL HIGH (ref 70–99)
Potassium: 4.1 mEq/L (ref 3.5–5.3)
Sodium: 143 mEq/L (ref 135–145)
Total Protein: 5.9 g/dL — ABNORMAL LOW (ref 6.0–8.3)

## 2011-11-14 LAB — CBC WITH DIFFERENTIAL/PLATELET
Basophils Absolute: 0 10*3/uL (ref 0.0–0.1)
EOS%: 1.1 % (ref 0.0–7.0)
Eosinophils Absolute: 0.1 10*3/uL (ref 0.0–0.5)
HGB: 10.2 g/dL — ABNORMAL LOW (ref 13.0–17.1)
MCH: 33.7 pg — ABNORMAL HIGH (ref 27.2–33.4)
MONO#: 0.6 10*3/uL (ref 0.1–0.9)
NEUT#: 3.1 10*3/uL (ref 1.5–6.5)
RDW: 16.1 % — ABNORMAL HIGH (ref 11.0–14.6)
WBC: 4.8 10*3/uL (ref 4.0–10.3)
lymph#: 1 10*3/uL (ref 0.9–3.3)

## 2011-11-14 LAB — TECHNOLOGIST REVIEW

## 2011-11-14 NOTE — Progress Notes (Signed)
CC:   Jamey Reas, MD Duke Salvia Eliott Nine, M.D.  REASON FOR CONSULTATION:  Thrombocytopenia.  HISTORY OF PRESENT ILLNESS:  Mr. Barretta is a pleasant 66 year old African American gentleman currently of [place] but lived in multiple locations.  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  He is a gentleman with a significant history of coronary artery disease, status post myocardial infarctions that to heart failure.  Ended up receiving a heart transplant in 2001.  He had been on an immune rejections including Prograf as well as initially cyclosporine and also as long history of diabetes that has led into end-stage renal failure.  Four years ago he received a kidney transplant in [place], part of the [facility].  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  He is currently on both Imuran as well as tacrolimus for immune suppression.  He is also on prednisone.  He was noted after a dialysis session to have rather profuse bleeding at the dialysis site and required emergency department visit.  That was back in March of 2013.  Since that time, he has noticed some excessive mild bleeding after each dialysis session.  His laboratory data indicate that he had mild thrombocytopenia dating back at least 2008.  His platelet count at that time was around 125 and it fluctuated as high up as 213 in 2011, was 100 in May of 2011.  That went up to 135 in 2012, and then November of 2012 was as low was 105.  Patient referred to me for evaluation for his thrombocytopenia.  Clinically, otherwise he is asymptomatic.  He is still rather functional.  He is ambulating without any major difficulty although recently has twisted his ankle and he is using a cane.  He still drives.  He had not had any other bleeding,  has not had any epistaxis, has not had any GI or GU bleeding.  REVIEW OF SYSTEMS:  He does not report any headaches, blurry vision, double vision.  He does not report any motor or sensory neuropathy.  Did not report any alteration in mental status.  Did not report any psychiatric issues, depression.  Did not report any fever, chills, sweats.  He does not report any cough, hemoptysis, hematemesis.  No nausea or vomiting.  No abdominal pain, hematochezia, melena.  No genitourinary complaints.  Rest of the review of systems is unremarkable.  PAST MEDICAL HISTORY:  Significant for coronary artery disease, status post heart transplant.  He has history of hypertension, diabetes, end- stage renal disease.  He had a history of a deep vein thrombosis.  He was on Coumadin on but he is no longer on that.  MEDICATIONS:  He is on aspirin, atorvastatin, Imuran, carvedilol, vitamin D. Pepcid, insulin. Isosorbide dinitrate, prednisone and tacrolimus.  ALLERGIES:  Allergic to lisinopril, Norvasc and penicillin.  SOCIAL HISTORY:  He is married.  He has 3 children.  Has remote smoking history, currently quit.  Does not drink any alcohol.  FAMILY HISTORY:  Significant for coronary artery disease.  Father had lung troubles.  No history of cancer or any malignancy in his immediate family.  PHYSICAL EXAMINATION:  Alert,  awake gentleman.  Appeared in really no active distress today.  His blood pressure is 110/61, pulse 81, respiration 18, temperature is 97, weight is 159 pounds.  ECOG performance status is 1.  Head:  Normocephalic, atraumatic.  Pupils equal and round, reactive to light.  Oral mucosa moist and pink.  Neck: Supple without adenopathy.  Heart:  Regular rate, S1, S2.  Lungs:  Clear to auscultation.  No rhonchi, wheeze, dullness to percussion.  Abdomen: Soft, nontender.  No hepatosplenomegaly.  Extremities:  No clubbing, cyanosis, or edema.  Neurologic:  Intact motor, sensory and deep  tendon reflexes.  Did not appreciate any petechiae or any bruises.  LABORATORY DATA:  Hemoglobin of 10, white count of 4.8, platelet count of 92,000.  He had normal differential.  Peripheral smear was personally reviewed and showed some macrocytosis.  The platelets appeared abundant in number, enlarged in size.  There are few teardrop cells and 1 nucleated red cell.  ASSESSMENT AND PLAN:  This is a pleasant 66 year old gentleman with the following issues: 1. Thrombocytopenia.  The differential diagnosis today discussed in     detail with Mr. Moes.  I believe the most likely offending agent     at this time with the medication, he is on both immunosuppressive     agents that are known to cause hematological toxicity including     Imuran and tacrolimus.  Both carry a risk about 10% to 25% of     thrombocytopenia and given the mild nature of his platelet drop as     well as the chronic nature of his platelet drop makes me think that     this is most likely related to that.  Other etiologies including     other medication such as certainly his Pepcid, which could     definitely be replaced with other anti-acid medication,  certainly     would be helpful in this particular situation.  Other conditions     such as myelodysplastic syndrome are definitely a possibility given     his age, multiple comorbid conditions among other things.  He     certainly could have developed a myelodysplasia.  I did not really     see any evidence of the dysplastic neutrophils today on his     peripheral smear, but definitely that is something for [place] to     monitor down the line.  If he developed any other cytopenias or     dysplasias, then we can definitely do a bone marrow biopsy at that     time.  For the time being, I think conservative management and     followup at this time would be the way to go. 2. Macrocytosis.  Again, I doubt this is really in signs of     myelodysplasia, but definitely it is  something to keep an eye on.     Again, immunosuppressive agents, both Imuran and Prograf, are known     to cause macrocytosis, and that definitely could be less likely the     case.  I will check his counts again in 3 months.  I will review     his peripheral smear 1 more, and again I have discussed with him     the potential of getting a bone marrow biopsy if his counts show     any further drop or any signs of a dysplasia.    ______________________________ Benjiman Core, M.D. FNS/MEDQ  D:  11/14/2011  T:  11/14/2011  Job:  [id number]

## 2011-11-14 NOTE — Progress Notes (Signed)
Note dictated

## 2011-11-14 NOTE — Telephone Encounter (Signed)
appts made and printed for pt aom °

## 2011-11-27 ENCOUNTER — Other Ambulatory Visit (HOSPITAL_COMMUNITY): Payer: Self-pay | Admitting: Nephrology

## 2011-11-27 DIAGNOSIS — N186 End stage renal disease: Secondary | ICD-10-CM

## 2011-12-03 ENCOUNTER — Ambulatory Visit (HOSPITAL_COMMUNITY): Payer: Medicare Other

## 2011-12-05 ENCOUNTER — Other Ambulatory Visit (HOSPITAL_COMMUNITY): Payer: Self-pay | Admitting: Nephrology

## 2011-12-05 ENCOUNTER — Ambulatory Visit (HOSPITAL_COMMUNITY)
Admission: RE | Admit: 2011-12-05 | Discharge: 2011-12-05 | Disposition: A | Discharge status: Home / Self Care | Payer: Medicare Other | Source: Ambulatory Visit | Attending: Nephrology | Admitting: Nephrology

## 2011-12-05 DIAGNOSIS — I120 Hypertensive chronic kidney disease with stage 5 chronic kidney disease or end stage renal disease: Secondary | ICD-10-CM | POA: Insufficient documentation

## 2011-12-05 DIAGNOSIS — E119 Type 2 diabetes mellitus without complications: Secondary | ICD-10-CM | POA: Insufficient documentation

## 2011-12-05 DIAGNOSIS — Z86718 Personal history of other venous thrombosis and embolism: Secondary | ICD-10-CM | POA: Insufficient documentation

## 2011-12-05 DIAGNOSIS — T82898A Other specified complication of vascular prosthetic devices, implants and grafts, initial encounter: Secondary | ICD-10-CM | POA: Insufficient documentation

## 2011-12-05 DIAGNOSIS — Y832 Surgical operation with anastomosis, bypass or graft as the cause of abnormal reaction of the patient, or of later complication, without mention of misadventure at the time of the procedure: Secondary | ICD-10-CM | POA: Insufficient documentation

## 2011-12-05 DIAGNOSIS — I509 Heart failure, unspecified: Secondary | ICD-10-CM | POA: Insufficient documentation

## 2011-12-05 DIAGNOSIS — I871 Compression of vein: Secondary | ICD-10-CM | POA: Insufficient documentation

## 2011-12-05 DIAGNOSIS — N186 End stage renal disease: Secondary | ICD-10-CM

## 2011-12-05 DIAGNOSIS — Z992 Dependence on renal dialysis: Secondary | ICD-10-CM | POA: Insufficient documentation

## 2011-12-05 MED ORDER — IOHEXOL 300 MG/ML  SOLN
150.0000 mL | Freq: Once | INTRAMUSCULAR | Status: AC | PRN
  Administered 2011-12-05: 120 mL via INTRAVENOUS

## 2011-12-05 MED ORDER — FENTANYL CITRATE 0.05 MG/ML IJ SOLN
INTRAMUSCULAR | Status: AC
  Filled 2011-12-05: qty 4

## 2011-12-05 MED ORDER — FENTANYL CITRATE 0.05 MG/ML IJ SOLN
INTRAMUSCULAR | Status: AC | PRN
  Administered 2011-12-05: 50 ug via INTRAVENOUS

## 2011-12-05 MED ORDER — MIDAZOLAM HCL 2 MG/2ML IJ SOLN
INTRAMUSCULAR | Status: AC
  Filled 2011-12-05: qty 4

## 2011-12-05 MED ORDER — MIDAZOLAM HCL 5 MG/5ML IJ SOLN
INTRAMUSCULAR | Status: AC | PRN
  Administered 2011-12-05: 1 mg via INTRAVENOUS

## 2011-12-05 NOTE — ED Notes (Signed)
In nurses station. Tolerating po fluids. VS stable and no pain.

## 2011-12-05 NOTE — H&P (Signed)
Brandon Sharp is an 66 y.o. male.   Chief Complaint: Decreased flows at dialysis HPI: 66 y/o M with ESRD on HD, now with decreased flows at dialysis.  Initial shutogram demonstrates recurrent stenosis/near occlusion of the central aspect of the right innominate with high grade irregular narrowing of the cephalic arch, both amendable to repeat intervention with likely stent placement.  In baseline state of health.  No CP, SOB or fever/chills.  Last dialysis yesterday.   Past Medical History  Diagnosis Date  . Renal failure   . Diabetes mellitus   . Hypertension   . Myocardial infarction 1985; 1990  . Angina   . Coronary artery disease   . Dysrhythmia   . CHF (congestive heart failure)   . DVT (deep venous thrombosis) ~ 12/2010    LLE  . Anemia   . Blood transfusion 06/1999    post heart transplant    Past Surgical History  Procedure Date  . Nephrectomy transplanted organ   . Av fistula repair     rt. a=fore arm fistula  . Av fistula placement ~ 01/2010    "this was my 2nd fistula placement"  . Heart transplant 06/1999  . Coronary angioplasty with stent placement 1985; 1990  . Coronary artery bypass graft 1990    CABG X3    No family history on file. Social History:  reports that he quit smoking about 23 years ago. His smoking use included Cigarettes. He has a 20 pack-year smoking history. He has never used smokeless tobacco. He reports that he drinks alcohol. He reports that he does not use illicit drugs.  Allergies:  Allergies  Allergen Reactions  . Lisinopril Swelling    Lips and tongue swell  . Norvasc (Amlodipine Besylate) Rash    Flushing  . Penicillins Rash     (Not in a hospital admission)  No results found for this or any previous visit (from the past 48 hour(s)). No results found.  Review of Systems  Constitutional: Negative.   Respiratory: Negative.   Cardiovascular: Negative.     There were no vitals taken for this visit. Physical Exam    Constitutional: He appears well-developed and well-nourished.  Cardiovascular: Normal rate, regular rhythm and normal heart sounds.   Respiratory: Effort normal and breath sounds normal.  Psychiatric: He has a normal mood and affect.     Assessment/Plan 66 y/o M with ESRD on HD, now with decreased flows at dialysis.  Initial shutogram demonstrates recurrent stenosis/near occlusion of the central aspect of the right innominate with high grade irregular narrowing of the cephalic arch, both amendable to repeat intervention with likely stent placement.   Informed consent obtained after discussion of the benefits and risks (including but not limited to bleeding, fistula occlusion).  Patient is in agreement with purposed POC.  Simonne Come 12/05/2011, 1:46 PM

## 2011-12-05 NOTE — ED Notes (Signed)
Patient awake but states is comfortable.

## 2011-12-05 NOTE — ED Notes (Signed)
H and P done per Dr. Grace Isaac.

## 2011-12-05 NOTE — Procedures (Signed)
Technically successful fistulogram with angioplasty and overlapping right subclavian and innominate vein stent placement.  No immediate complications.

## 2012-02-13 ENCOUNTER — Other Ambulatory Visit (HOSPITAL_BASED_OUTPATIENT_CLINIC_OR_DEPARTMENT_OTHER): Payer: Medicare Other

## 2012-02-13 ENCOUNTER — Telehealth: Payer: Self-pay | Admitting: Oncology

## 2012-02-13 ENCOUNTER — Ambulatory Visit (HOSPITAL_BASED_OUTPATIENT_CLINIC_OR_DEPARTMENT_OTHER): Payer: Medicare Other | Admitting: Oncology

## 2012-02-13 VITALS — BP 129/58 | HR 77 | Temp 97.0°F | Resp 20 | Ht 72.0 in | Wt 160.2 lb

## 2012-02-13 DIAGNOSIS — D689 Coagulation defect, unspecified: Secondary | ICD-10-CM

## 2012-02-13 DIAGNOSIS — Z940 Kidney transplant status: Secondary | ICD-10-CM

## 2012-02-13 DIAGNOSIS — Z941 Heart transplant status: Secondary | ICD-10-CM

## 2012-02-13 DIAGNOSIS — D7589 Other specified diseases of blood and blood-forming organs: Secondary | ICD-10-CM

## 2012-02-13 DIAGNOSIS — D696 Thrombocytopenia, unspecified: Secondary | ICD-10-CM

## 2012-02-13 LAB — CBC WITH DIFFERENTIAL/PLATELET
BASO%: 0.4 % (ref 0.0–2.0)
EOS%: 2.1 % (ref 0.0–7.0)
HCT: 36.1 % — ABNORMAL LOW (ref 38.4–49.9)
LYMPH%: 17 % (ref 14.0–49.0)
MCH: 36.3 pg — ABNORMAL HIGH (ref 27.2–33.4)
MCHC: 32.3 g/dL (ref 32.0–36.0)
NEUT%: 67.6 % (ref 39.0–75.0)
Platelets: 76 10*3/uL — ABNORMAL LOW (ref 140–400)
lymph#: 0.8 10*3/uL — ABNORMAL LOW (ref 0.9–3.3)

## 2012-02-13 NOTE — Telephone Encounter (Signed)
gv pt appt schedule for December. °

## 2012-02-13 NOTE — Progress Notes (Signed)
Hematology and Oncology Follow Up Visit  Brandon Sharp [id number] 1945/07/13 66 y.o. 02/13/2012 1:40 PM   Principle Diagnosis: 66 year old with thrombocytopenia diagnosed in in 10/2011. Etiology likely related to immunosuppressive agents. Other etilogies are still under consideration.   Secondary diagnosis: He is S/P heart and kidney transplant. He on Imuran and Prograf for rejection.   Interim History:  66 year old man with above history presents for a follow up visit after I have seen him for the first time in 10/2011. Since his last visit, he is not reporting any problems. He has not noticed any bleeding. No recent illness or hospitalizations. Clinically, otherwise he is asymptomatic. He is still rather functional. He is ambulating without any major difficulty although recently has twisted his ankle and he is using a cane. He still drives. He had not had any other bleeding, has  not had any epistaxis, has not had any GI or GU bleeding.   Medications: I have reviewed the patient's current medications. Current outpatient prescriptions:aspirin EC 81 MG tablet, Take 81 mg by mouth daily.  , Disp: , Rfl: ;  atorvastatin (LIPITOR) 40 MG tablet, Take 40 mg by mouth daily.  , Disp: , Rfl: ;  azaTHIOprine (IMURAN) 50 MG tablet, Take 75 mg by mouth daily.  , Disp: , Rfl: ;  carvedilol (COREG) 25 MG tablet, Take 50 mg by mouth 2 (two) times daily with a meal. Takes 50mg  on Tue thurs sat and sun Takes 25 mg on mon wed and fri, Disp: , Rfl:  Cholecalciferol (VITAMIN D PO), Take 1 capsule by mouth daily.  , Disp: , Rfl: ;  colesevelam (WELCHOL) 625 MG tablet, Take 3,750 mg by mouth daily. Takes 6 tabs daily, Disp: , Rfl: ;  famotidine (PEPCID) 20 MG tablet, Take 20 mg by mouth daily.  , Disp: , Rfl: ;  insulin glargine (LANTUS) 100 UNIT/ML injection, Inject 25 Units into the skin daily., Disp: , Rfl:  isosorbide dinitrate (ISORDIL) 40 MG tablet, Take 40 mg by mouth 3 (three) times daily.  , Disp: , Rfl: ;   predniSONE (DELTASONE) 10 MG tablet, Take 10 mg by mouth daily.  , Disp: , Rfl: ;  tacrolimus (PROGRAF) 1 MG capsule, Take 1 mg by mouth 2 (two) times daily.  , Disp: , Rfl: ;  DISCONTD: doxazosin (CARDURA) 8 MG tablet, Take 8 mg by mouth 2 (two) times daily.  , Disp: , Rfl:  DISCONTD: hydrALAZINE (APRESOLINE) 25 MG tablet, Take 75 mg by mouth 3 (three) times daily.  , Disp: , Rfl: ;  DISCONTD: warfarin (COUMADIN) 2.5 MG tablet, Take 2.5 mg by mouth daily. , Disp: , Rfl:   Allergies:  Allergies  Allergen Reactions  . Lisinopril Swelling    Lips and tongue swell  . Norvasc (Amlodipine Besylate) Rash    Flushing  . Penicillins Rash    Past Medical History, Surgical history, Social history, and Family History were reviewed and updated.  Review of Systems: Constitutional:  Negative for fever, chills, night sweats, anorexia, weight loss, pain. Cardiovascular: no chest pain or dyspnea on exertion Respiratory: negative Neurological: negative Dermatological: negative ENT: negative Skin: Negative. Gastrointestinal: negative Genito-Urinary: negative Hematological and Lymphatic: negative Breast: negative Musculoskeletal: negative Remaining ROS negative. Physical Exam: Blood pressure 129/58, pulse 77, temperature 97 F (36.1 C), temperature source Oral, resp. rate 20, height 6' (1.829 m), weight 160 lb 3.2 oz (72.666 kg). ECOG: 1 General appearance: alert Head: Normocephalic, without obvious abnormality, atraumatic Neck: no  adenopathy, no carotid bruit, no JVD, supple, symmetrical, trachea midline and thyroid not enlarged, symmetric, no tenderness/mass/nodules Lymph nodes: Cervical, supraclavicular, and axillary nodes normal. Heart:regular rate and rhythm, S1, S2 normal, no murmur, click, rub or gallop Lung:chest clear, no wheezing, rales, normal symmetric air entry Abdomin: soft, non-tender, without masses or organomegaly EXT:no erythema, induration, or nodules   Lab Results: Lab  Results  Component Value Date   WBC 4.5 02/13/2012   HGB 11.6* 02/13/2012   HCT 36.1* 02/13/2012   MCV 112.6* 02/13/2012   PLT 76* 02/13/2012     Chemistry      Component Value Date/Time   NA 143 11/14/2011 1332   K 4.1 11/14/2011 1332   CL 99 11/14/2011 1332   CO2 30 11/14/2011 1332   BUN 30* 11/14/2011 1332   CREATININE 6.14* 11/14/2011 1332      Component Value Date/Time   CALCIUM 9.4 11/14/2011 1332   ALKPHOS 47 11/14/2011 1332   AST 17 11/14/2011 1332   ALT 9 11/14/2011 1332   BILITOT 0.5 11/14/2011 1332      Impression and Plan:  This is a pleasant 66 year old gentleman with the  following issues:  1. Thrombocytopenia. The differential diagnosis today discussed with Mr. Strey. Both immunosuppressive agents that are known to cause hematological toxicity including Imuran and tacrolimus. Both carry a risk about 10% to 25% of thrombocytopenia and given the mild nature of his platelet drop as  well as the chronic nature of his platelet drop makes me think it is most likely related to that. Other etiologies including  Other conditions  such as myelodysplastic syndrome are definitely a possibility given his age. If he developed any other cytopenias or dysplasias, then we can definitely do a bone marrow biopsy at that time. For the time being, I think conservative management and followup at this time would be the way to go.  No bleeding noted and he does not need any transfusion.   2. Macrocytosis. immunosuppressive agents, both Imuran and Prograf, are known to cause macrocytosis, and that definitely could be less likely the  case. We will continue to monitor.   Eli Hose, MD 9/19/20131:40 PM

## 2012-03-17 ENCOUNTER — Encounter (HOSPITAL_COMMUNITY): Payer: Self-pay | Admitting: Gastroenterology

## 2012-03-17 ENCOUNTER — Ambulatory Visit (HOSPITAL_COMMUNITY)
Admission: RE | Admit: 2012-03-17 | Discharge: 2012-03-17 | Disposition: A | Discharge status: Home / Self Care | Payer: Medicare Other | Source: Ambulatory Visit | Attending: Gastroenterology | Admitting: Gastroenterology

## 2012-03-17 ENCOUNTER — Encounter (HOSPITAL_COMMUNITY)
Admission: RE | Disposition: A | Discharge status: Home / Self Care | Payer: Self-pay | Source: Ambulatory Visit | Attending: Gastroenterology

## 2012-03-17 DIAGNOSIS — D126 Benign neoplasm of colon, unspecified: Secondary | ICD-10-CM | POA: Insufficient documentation

## 2012-03-17 DIAGNOSIS — Z1211 Encounter for screening for malignant neoplasm of colon: Secondary | ICD-10-CM | POA: Insufficient documentation

## 2012-03-17 HISTORY — PX: COLONOSCOPY: SHX5424

## 2012-03-17 SURGERY — COLONOSCOPY
Anesthesia: Moderate Sedation

## 2012-03-17 MED ORDER — MIDAZOLAM HCL 10 MG/2ML IJ SOLN
INTRAMUSCULAR | Status: AC
  Filled 2012-03-17: qty 2

## 2012-03-17 MED ORDER — FENTANYL CITRATE 0.05 MG/ML IJ SOLN
INTRAMUSCULAR | Status: AC
  Filled 2012-03-17: qty 2

## 2012-03-17 MED ORDER — SODIUM CHLORIDE 0.9 % IV SOLN
INTRAVENOUS | Status: DC
  Administered 2012-03-17: 250 mL via INTRAVENOUS

## 2012-03-17 MED ORDER — FENTANYL CITRATE 0.05 MG/ML IJ SOLN
INTRAMUSCULAR | Status: DC | PRN
  Administered 2012-03-17 (×3): 25 ug via INTRAVENOUS

## 2012-03-17 MED ORDER — MIDAZOLAM HCL 5 MG/5ML IJ SOLN
INTRAMUSCULAR | Status: DC | PRN
  Administered 2012-03-17: 1 mg via INTRAVENOUS
  Administered 2012-03-17 (×2): 2 mg via INTRAVENOUS

## 2012-03-17 MED ORDER — SODIUM CHLORIDE 0.9 % IV SOLN: INTRAVENOUS | Status: DC

## 2012-03-17 NOTE — Interval H&P Note (Signed)
History and Physical Interval Note:  03/17/2012 11:43 AM  Brandon Sharp  has presented today for surgery, with the diagnosis of screening  The various methods of treatment have been discussed with the patient and family. After consideration of risks, benefits and other options for treatment, the patient has consented to  Procedure(s) (LRB) with comments: COLONOSCOPY (N/A) as a surgical intervention .  The patient's history has been reviewed, patient examined, no change in status, stable for surgery.  I have reviewed the patient's chart and labs.  Questions were answered to the patient's satisfaction.     Kolbey Teichert C.

## 2012-03-17 NOTE — Op Note (Signed)
[facility] [address] [place], [address]   COLONOSCOPY PROCEDURE REPORT  PATIENT: Brandon Sharp, Brandon Sharp  MR#: [id number] BIRTHDATE: January 07, 1946 , 66  yrs. old GENDER: Male ENDOSCOPIST: Charlott Rakes, MD REFERRED [id number] Eliott Nine, M.D. PROCEDURE DATE:  03/17/2012 PROCEDURE:   Colonoscopy with snare polypectomy ASA CLASS:   Class III INDICATIONS:average risk patient for colon cancer. MEDICATIONS: Fentanyl 75 mcg IV and Versed 5 mg IV  DESCRIPTION OF PROCEDURE:   After the risks benefits and alternatives of the procedure were thoroughly explained, informed consent was obtained.  The Pentax Ped Colon [id number]  endoscope was introduced through the anus and advanced to the cecum, which was identified by both the appendix and ileocecal valve , limited by No adverse events experienced.   The quality of the prep was fair. . The instrument was then slowly withdrawn as the colon was fully examined.     FINDINGS:  Rectal exam unremarkable.  Pediatric colonoscope inserted into the colon and advanced to the cecum, where the appendiceal orifice and ileocecal valve were identified.  In the cecum there were 3 sessile polyps seen ranging from 3 mm to 5 mm in size. These polyps were nodular and initially they were biopsied with a cold biopsy forceps and then they were removed completely with snare cautery. upon further inspection of the cecum a 6 mm semi-sessile polyp was removed with snare cautery an 8 mm sessile polyp was removed with snare cautery. On careful withdrawal of the colonoscope a 1.2 cm pedunculated polyp was removed in the proximal ascending colon with snare cautery. A 6mm sessile polyp was removed from the ascending colon with snare cautery. Two sessile polyps ranging from 1cm to 1.2 cm were removed from the mid-ascending colon with snare cautery.   Multiple diverticulae were seen in sigmoid and descending colon. Retroflexion revealed small  internal hemorrhoids. Technical difficulties prevented photodocumentation of the rectum.  COMPLICATIONS: None  IMPRESSION:     1. Multiple (9) polyps removed as stated above 2. Left-sided diverticulosis 3. Internal hemorrhoids   RECOMMENDATIONS: F/U on path; No aspirin products for 2 weeks    ______________________________ eSigned:  Charlott Rakes, MD 03/17/2012 1:26 PM   [id number] Eliott Nine, MD  PATIENT NAME:  Brandon Sharp, Brandon Sharp MR#: [id number]

## 2012-03-17 NOTE — H&P (Signed)
  Date of Initial H&P: 03/11/12  History reviewed, patient examined, no change in status, stable for surgery. Screening colonoscopy.

## 2012-03-18 ENCOUNTER — Encounter (HOSPITAL_COMMUNITY): Payer: Self-pay

## 2012-03-18 ENCOUNTER — Encounter (HOSPITAL_COMMUNITY): Payer: Self-pay | Admitting: Gastroenterology

## 2012-03-23 ENCOUNTER — Other Ambulatory Visit (HOSPITAL_COMMUNITY): Payer: Self-pay | Admitting: Nephrology

## 2012-03-23 DIAGNOSIS — N186 End stage renal disease: Secondary | ICD-10-CM

## 2012-03-26 ENCOUNTER — Ambulatory Visit (HOSPITAL_COMMUNITY)
Admission: RE | Admit: 2012-03-26 | Discharge: 2012-03-26 | Disposition: A | Discharge status: Home / Self Care | Payer: Medicare Other | Source: Ambulatory Visit | Attending: Nephrology | Admitting: Nephrology

## 2012-03-26 ENCOUNTER — Other Ambulatory Visit (HOSPITAL_COMMUNITY): Payer: Self-pay | Admitting: Nephrology

## 2012-03-26 DIAGNOSIS — N186 End stage renal disease: Secondary | ICD-10-CM

## 2012-03-26 DIAGNOSIS — Z941 Heart transplant status: Secondary | ICD-10-CM | POA: Insufficient documentation

## 2012-03-26 DIAGNOSIS — I871 Compression of vein: Secondary | ICD-10-CM | POA: Insufficient documentation

## 2012-03-26 DIAGNOSIS — Z992 Dependence on renal dialysis: Secondary | ICD-10-CM | POA: Insufficient documentation

## 2012-03-26 DIAGNOSIS — Z940 Kidney transplant status: Secondary | ICD-10-CM | POA: Insufficient documentation

## 2012-03-26 DIAGNOSIS — Y832 Surgical operation with anastomosis, bypass or graft as the cause of abnormal reaction of the patient, or of later complication, without mention of misadventure at the time of the procedure: Secondary | ICD-10-CM | POA: Insufficient documentation

## 2012-03-26 DIAGNOSIS — T82898A Other specified complication of vascular prosthetic devices, implants and grafts, initial encounter: Secondary | ICD-10-CM | POA: Insufficient documentation

## 2012-03-26 DIAGNOSIS — E119 Type 2 diabetes mellitus without complications: Secondary | ICD-10-CM | POA: Insufficient documentation

## 2012-03-26 DIAGNOSIS — I120 Hypertensive chronic kidney disease with stage 5 chronic kidney disease or end stage renal disease: Secondary | ICD-10-CM | POA: Insufficient documentation

## 2012-03-26 MED ORDER — MIDAZOLAM HCL 2 MG/2ML IJ SOLN
INTRAMUSCULAR | Status: AC
  Filled 2012-03-26: qty 2

## 2012-03-26 MED ORDER — HEPARIN SODIUM (PORCINE) 1000 UNIT/ML IJ SOLN
INTRAMUSCULAR | Status: AC
  Filled 2012-03-26: qty 1

## 2012-03-26 MED ORDER — IOHEXOL 300 MG/ML  SOLN
100.0000 mL | Freq: Once | INTRAMUSCULAR | Status: AC | PRN
  Administered 2012-03-26: 60 mL via INTRAVENOUS

## 2012-03-26 MED ORDER — FENTANYL CITRATE 0.05 MG/ML IJ SOLN
INTRAMUSCULAR | Status: AC
  Filled 2012-03-26: qty 2

## 2012-03-26 MED ORDER — MIDAZOLAM HCL 2 MG/2ML IJ SOLN
INTRAMUSCULAR | Status: DC | PRN
  Administered 2012-03-26: 1 mg via INTRAVENOUS

## 2012-03-26 MED ORDER — FENTANYL CITRATE 0.05 MG/ML IJ SOLN
INTRAMUSCULAR | Status: DC | PRN
  Administered 2012-03-26: 50 ug via INTRAVENOUS

## 2012-03-26 NOTE — H&P (Signed)
Agree 

## 2012-03-26 NOTE — ED Notes (Signed)
O2 2L/ started prior to case 

## 2012-03-26 NOTE — Procedures (Signed)
Procedure:  Right fistulogram with venous angioplasty Cephalic and right subclavian vein angioplasty performed with good result.  See report.

## 2012-03-26 NOTE — H&P (Signed)
Brandon Sharp is an 66 y.o. male.   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  Past Medical History  Diagnosis Date  . Diabetes mellitus   . Hypertension   . Myocardial infarction 1985; 1990  . Angina   . Coronary artery disease   . Dysrhythmia   . CHF (congestive heart failure)   . DVT (deep venous thrombosis) ~ 12/2010    LLE  . Anemia   . Blood transfusion 06/1999    post heart transplant  . Renal failure     Hemodialysis MWF    Past Surgical History  Procedure Date  . Nephrectomy transplanted organ   . Av fistula repair     rt. a=fore arm fistula  . Av fistula placement ~ 01/2010    "this was my 2nd fistula placement"  . Heart transplant 06/1999  . Coronary angioplasty with stent placement 1985; 1990  . Coronary artery bypass graft 1990    CABG X3  . Colonoscopy 03/17/2012    Procedure: COLONOSCOPY;  Surgeon: Shirley Friar, MD;  Location: [facility] END[facility];  Service: Endoscopy;  Laterality: N/A;    No family history on file. Social History:  reports that he quit smoking about 23 years ago. His smoking use included Cigarettes. He has a 20 pack-year smoking history. He has never used smokeless tobacco. He reports that he drinks alcohol. He reports that he does not use illicit drugs.  Allergies:  Allergies  Allergen Reactions  . Lisinopril Swelling    Lips and tongue swell  . Norvasc (Amlodipine Besylate) Rash    Flushing  . Penicillins Rash    Current outpatient prescriptions:aspirin EC 81 MG tablet, Take 81 mg by mouth daily.  , Disp: , Rfl: ;  atorvastatin (LIPITOR) 40 MG tablet, Take 40 mg by mouth daily.  , Disp: , Rfl: ;  azaTHIOprine (IMURAN) 50 MG tablet, Take 75 mg by mouth daily.  , Disp: , Rfl: ;  carvedilol (COREG) 25 MG tablet, Take 50  mg by mouth 2 (two) times daily with a meal. Takes 50mg  on Tue thurs sat and sun Takes 25 mg on mon wed and fri, Disp: , Rfl:  Cholecalciferol (VITAMIN D PO), Take 1 capsule by mouth daily.  , Disp: , Rfl: ;  colesevelam (WELCHOL) 625 MG tablet, Take 3,750 mg by mouth daily. Takes 6 tabs daily, Disp: , Rfl: ;  famotidine (PEPCID) 20 MG tablet, Take 20 mg by mouth daily.  , Disp: , Rfl: ;  insulin glargine (LANTUS) 100 UNIT/ML injection, Inject 25 Units into the skin daily., Disp: , Rfl:  isosorbide dinitrate (ISORDIL) 40 MG tablet, Take 40 mg by mouth 3 (three) times daily.  , Disp: , Rfl: ;  predniSONE (DELTASONE) 10 MG tablet, Take 10 mg by mouth daily.  , Disp: , Rfl: ;  tacrolimus (PROGRAF) 1 MG capsule, Take 1 mg by mouth 2 (two) times daily.  , Disp: , Rfl: ;  DISCONTD: doxazosin (CARDURA) 8 MG tablet, Take 8 mg by mouth 2 (two) times daily.  , Disp: , Rfl:  DISCONTD: hydrALAZINE (APRESOLINE) 25 MG tablet, Take 75 mg by mouth 3 (three) times daily.  , Disp: , Rfl: ;  DISCONTD: warfarin (COUMADIN) 2.5 MG tablet, Take 2.5 mg by mouth daily. , Disp: , Rfl:  No results found for this or any previous visit (from the past 48 hour(s)). No results found.  Review of Systems  Constitutional: Negative for fever and chills.  Respiratory: Negative for cough and shortness of breath.   Cardiovascular: Negative for chest pain.  Gastrointestinal: Negative for nausea, vomiting and abdominal pain.  Musculoskeletal: Negative for back pain.  Neurological: Negative for headaches.  Endo/Heme/Allergies: Does not bruise/bleed easily.   Vitals: HR 63  O2 SATS 98%RA    BP132/64 Physical Exam  Constitutional: He is oriented to person, place, and time. He appears well-developed and well-nourished.  Cardiovascular: Normal rate and regular rhythm.        Rt arm AVF with positive thrill/bruit  Respiratory: Effort normal and breath sounds normal.  GI: Soft. Bowel sounds are normal.  Musculoskeletal: Normal range of  motion. He exhibits no edema.  Neurological: He is alert and oriented to person, place, and time.     Assessment/Plan Pt with ESRD and venous stenoses along right arm AVF . Plan is for angioplasty/possible stenting of stenoses or placement of new catheter if needed. Details of above d/w pt with his understanding and consent.  Lily Velasquez,D KEVIN 03/26/2012, 1:09 PM

## 2012-03-26 NOTE — ED Notes (Signed)
O2 d/c'd 

## 2012-03-26 NOTE — ED Notes (Signed)
Meds to be given by Dr Fredia Sorrow, no iv access.

## 2012-05-14 ENCOUNTER — Telehealth: Payer: Self-pay | Admitting: Oncology

## 2012-05-14 ENCOUNTER — Other Ambulatory Visit: Payer: Medicare Other | Admitting: Lab

## 2012-05-14 ENCOUNTER — Ambulatory Visit: Payer: Medicare Other | Admitting: Oncology

## 2012-05-14 NOTE — Telephone Encounter (Signed)
pt called and wanted to cancel todays appt and did not want to r/s any other...he stated that he will call to get an appt when he gets some thing together.Marland KitchenMarland KitchenMarland Kitchen

## 2012-09-28 ENCOUNTER — Other Ambulatory Visit (HOSPITAL_COMMUNITY): Payer: Self-pay | Admitting: Nephrology

## 2012-09-28 DIAGNOSIS — N186 End stage renal disease: Secondary | ICD-10-CM

## 2012-10-01 ENCOUNTER — Other Ambulatory Visit (HOSPITAL_COMMUNITY): Payer: Self-pay | Admitting: Nephrology

## 2012-10-01 ENCOUNTER — Encounter (HOSPITAL_COMMUNITY): Payer: Self-pay | Admitting: Nurse Practitioner

## 2012-10-01 ENCOUNTER — Emergency Department (HOSPITAL_COMMUNITY)
Admission: EM | Admit: 2012-10-01 | Discharge: 2012-10-01 | Disposition: A | Discharge status: Home / Self Care | Payer: Medicare Other | Attending: Emergency Medicine | Admitting: Emergency Medicine

## 2012-10-01 ENCOUNTER — Ambulatory Visit (HOSPITAL_COMMUNITY)
Admission: RE | Admit: 2012-10-01 | Discharge: 2012-10-01 | Disposition: A | Discharge status: Home / Self Care | Payer: Medicare Other | Source: Ambulatory Visit | Attending: Nephrology | Admitting: Nephrology

## 2012-10-01 ENCOUNTER — Encounter (HOSPITAL_COMMUNITY): Payer: Self-pay

## 2012-10-01 VITALS — BP 116/54 | HR 64 | Resp 18

## 2012-10-01 DIAGNOSIS — Z941 Heart transplant status: Secondary | ICD-10-CM | POA: Insufficient documentation

## 2012-10-01 DIAGNOSIS — Z880 Allergy status to penicillin: Secondary | ICD-10-CM | POA: Insufficient documentation

## 2012-10-01 DIAGNOSIS — Z940 Kidney transplant status: Secondary | ICD-10-CM | POA: Insufficient documentation

## 2012-10-01 DIAGNOSIS — I2510 Atherosclerotic heart disease of native coronary artery without angina pectoris: Secondary | ICD-10-CM | POA: Insufficient documentation

## 2012-10-01 DIAGNOSIS — M79609 Pain in unspecified limb: Secondary | ICD-10-CM

## 2012-10-01 DIAGNOSIS — N186 End stage renal disease: Secondary | ICD-10-CM

## 2012-10-01 DIAGNOSIS — Z87891 Personal history of nicotine dependence: Secondary | ICD-10-CM | POA: Insufficient documentation

## 2012-10-01 DIAGNOSIS — I252 Old myocardial infarction: Secondary | ICD-10-CM | POA: Insufficient documentation

## 2012-10-01 DIAGNOSIS — I209 Angina pectoris, unspecified: Secondary | ICD-10-CM | POA: Insufficient documentation

## 2012-10-01 DIAGNOSIS — Y832 Surgical operation with anastomosis, bypass or graft as the cause of abnormal reaction of the patient, or of later complication, without mention of misadventure at the time of the procedure: Secondary | ICD-10-CM | POA: Insufficient documentation

## 2012-10-01 DIAGNOSIS — E119 Type 2 diabetes mellitus without complications: Secondary | ICD-10-CM | POA: Insufficient documentation

## 2012-10-01 DIAGNOSIS — I120 Hypertensive chronic kidney disease with stage 5 chronic kidney disease or end stage renal disease: Secondary | ICD-10-CM | POA: Insufficient documentation

## 2012-10-01 DIAGNOSIS — Z9861 Coronary angioplasty status: Secondary | ICD-10-CM | POA: Insufficient documentation

## 2012-10-01 DIAGNOSIS — I509 Heart failure, unspecified: Secondary | ICD-10-CM | POA: Insufficient documentation

## 2012-10-01 DIAGNOSIS — Z888 Allergy status to other drugs, medicaments and biological substances status: Secondary | ICD-10-CM | POA: Insufficient documentation

## 2012-10-01 DIAGNOSIS — I871 Compression of vein: Secondary | ICD-10-CM | POA: Insufficient documentation

## 2012-10-01 DIAGNOSIS — Z862 Personal history of diseases of the blood and blood-forming organs and certain disorders involving the immune mechanism: Secondary | ICD-10-CM | POA: Insufficient documentation

## 2012-10-01 DIAGNOSIS — I70219 Atherosclerosis of native arteries of extremities with intermittent claudication, unspecified extremity: Secondary | ICD-10-CM | POA: Insufficient documentation

## 2012-10-01 DIAGNOSIS — I70212 Atherosclerosis of native arteries of extremities with intermittent claudication, left leg: Secondary | ICD-10-CM

## 2012-10-01 DIAGNOSIS — D649 Anemia, unspecified: Secondary | ICD-10-CM | POA: Insufficient documentation

## 2012-10-01 DIAGNOSIS — Z951 Presence of aortocoronary bypass graft: Secondary | ICD-10-CM | POA: Insufficient documentation

## 2012-10-01 DIAGNOSIS — Z992 Dependence on renal dialysis: Secondary | ICD-10-CM | POA: Insufficient documentation

## 2012-10-01 DIAGNOSIS — Z7982 Long term (current) use of aspirin: Secondary | ICD-10-CM | POA: Insufficient documentation

## 2012-10-01 DIAGNOSIS — Z794 Long term (current) use of insulin: Secondary | ICD-10-CM | POA: Insufficient documentation

## 2012-10-01 DIAGNOSIS — T82898A Other specified complication of vascular prosthetic devices, implants and grafts, initial encounter: Secondary | ICD-10-CM | POA: Insufficient documentation

## 2012-10-01 DIAGNOSIS — Z86718 Personal history of other venous thrombosis and embolism: Secondary | ICD-10-CM | POA: Insufficient documentation

## 2012-10-01 DIAGNOSIS — Z8679 Personal history of other diseases of the circulatory system: Secondary | ICD-10-CM | POA: Insufficient documentation

## 2012-10-01 DIAGNOSIS — Z79899 Other long term (current) drug therapy: Secondary | ICD-10-CM | POA: Insufficient documentation

## 2012-10-01 DIAGNOSIS — IMO0002 Reserved for concepts with insufficient information to code with codable children: Secondary | ICD-10-CM | POA: Insufficient documentation

## 2012-10-01 LAB — PROTIME-INR
INR: 1.05 (ref 0.00–1.49)
Prothrombin Time: 13.6 seconds (ref 11.6–15.2)

## 2012-10-01 LAB — POCT I-STAT, CHEM 8
Calcium, Ion: 1.16 mmol/L (ref 1.13–1.30)
Creatinine, Ser: 5 mg/dL — ABNORMAL HIGH (ref 0.50–1.35)
Glucose, Bld: 192 mg/dL — ABNORMAL HIGH (ref 70–99)
HCT: 33 % — ABNORMAL LOW (ref 39.0–52.0)
Hemoglobin: 11.2 g/dL — ABNORMAL LOW (ref 13.0–17.0)
Potassium: 3.7 mEq/L (ref 3.5–5.1)

## 2012-10-01 LAB — CBC WITH DIFFERENTIAL/PLATELET
Basophils Absolute: 0 10*3/uL (ref 0.0–0.1)
Eosinophils Absolute: 0 10*3/uL (ref 0.0–0.7)
Hemoglobin: 10.6 g/dL — ABNORMAL LOW (ref 13.0–17.0)
Lymphocytes Relative: 23 % (ref 12–46)
MCH: 34.8 pg — ABNORMAL HIGH (ref 26.0–34.0)
MCHC: 32.9 g/dL (ref 30.0–36.0)
Monocytes Absolute: 0.3 10*3/uL (ref 0.1–1.0)
Neutrophils Relative %: 69 % (ref 43–77)
Platelets: 78 10*3/uL — ABNORMAL LOW (ref 150–400)
RDW: 15.1 % (ref 11.5–15.5)

## 2012-10-01 MED ORDER — IOHEXOL 300 MG/ML  SOLN
100.0000 mL | Freq: Once | INTRAMUSCULAR | Status: AC | PRN
  Administered 2012-10-01: 50 mL via INTRAVENOUS

## 2012-10-01 MED ORDER — MIDAZOLAM HCL 2 MG/2ML IJ SOLN
INTRAMUSCULAR | Status: DC | PRN
  Administered 2012-10-01: 1 mg via INTRAVENOUS

## 2012-10-01 MED ORDER — MIDAZOLAM HCL 2 MG/2ML IJ SOLN
INTRAMUSCULAR | Status: AC
  Filled 2012-10-01: qty 4

## 2012-10-01 MED ORDER — HYDROCODONE-ACETAMINOPHEN 5-325 MG PO TABS: 1.0000 | ORAL_TABLET | ORAL | Status: DC | PRN

## 2012-10-01 MED ORDER — FENTANYL CITRATE 0.05 MG/ML IJ SOLN
INTRAMUSCULAR | Status: AC
  Filled 2012-10-01: qty 4

## 2012-10-01 MED ORDER — FENTANYL CITRATE 0.05 MG/ML IJ SOLN
INTRAMUSCULAR | Status: DC | PRN
  Administered 2012-10-01: 50 ug via INTRAVENOUS

## 2012-10-01 NOTE — ED Notes (Signed)
MD at bedside. Prepping pt.

## 2012-10-01 NOTE — H&P (Signed)
Brandon Sharp is an 67 y.o. male.   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 Diabetes mellitus   . Hypertension   . Myocardial infarction 1985; 1990  . Angina   . Coronary artery disease   . Dysrhythmia   . CHF (congestive heart failure)   . DVT (deep venous thrombosis) ~ 12/2010    LLE  . Anemia   . Blood transfusion 06/1999    post heart transplant  . Renal failure     Hemodialysis MWF    Past Surgical History  Procedure Laterality Date  . Nephrectomy transplanted organ    . Av fistula repair      rt. Sharp=fore arm fistula  . Av fistula placement  ~ 01/2010    "this was my 2nd fistula placement"  . Heart transplant  06/1999  . Coronary angioplasty with stent placement  1985; 1990  . Coronary artery bypass graft  1990    CABG X3  . Colonoscopy  03/17/2012    Procedure: COLONOSCOPY;  Surgeon: Shirley Friar, MD;  Location: [facility];  Service: Endoscopy;  Laterality: N/Sharp;    No family history on file. Social History:  reports that he quit smoking about 24 years ago. His smoking use included Cigarettes. He has Sharp 20 pack-year smoking history. He has never used smokeless tobacco. He reports that  drinks alcohol. He reports that he does not use illicit drugs.  Allergies:  Allergies  Allergen Reactions  . Lisinopril Swelling    Lips and tongue swell  . Norvasc (Amlodipine Besylate) Rash    Flushing  . Penicillins Rash     (Not in Sharp hospital admission)  No results found for this or any previous visit (from the past 48 hour(s)). No results found.  Review of Systems  Constitutional: Negative for fever.  Respiratory: Negative for shortness of breath.   Cardiovascular: Negative for chest pain.  Gastrointestinal: Negative for nausea and vomiting.  Musculoskeletal: Positive for joint pain.       Left leg pain today   Neurological: Positive for weakness.    Blood pressure 111/57, pulse 67, resp. rate 20, SpO2 99.00%. Physical Exam  Constitutional: He is oriented to person, place, and time. He appears well-developed and well-nourished.  Cardiovascular: Normal rate, regular rhythm and normal heart sounds.   No murmur heard. Respiratory: Effort normal and breath sounds normal. He has no wheezes.  GI: Soft. Bowel sounds are normal. There is no tenderness.  Musculoskeletal: Normal range of motion.  Left leg pain this am Using wc now Does use cane for unsteadiness normally  Neurological: He is alert and oriented to person, place, and time.  Psychiatric: He has Sharp normal mood and affect. His behavior is normal. Judgment and thought content normal.     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

## 2012-10-01 NOTE — ED Notes (Signed)
O2 2L/[place] started 

## 2012-10-01 NOTE — Progress Notes (Signed)
VASCULAR LAB PRELIMINARY  PRELIMINARY  PRELIMINARY  PRELIMINARY  Left lower extremity venous Doppler completed.    Preliminary report:  There is no DVT or SVT noted in the left lower extremity.  Vrinda Heckstall, RVT 10/01/2012, 12:12 PM

## 2012-10-01 NOTE — ED Notes (Addendum)
States his L leg started to hurt yesterday, pain is really bad in upper leg when he tries to walk. Has been using old crutches to ambulate due to pain. Denies any injuries, redness or swelling, but states "it feels like when i had a dvt."

## 2012-10-01 NOTE — H&P (Signed)
Agree 

## 2012-10-01 NOTE — ED Notes (Signed)
Pressure held by Dulce Sellar, RT.  Vpad used

## 2012-10-01 NOTE — ED Notes (Addendum)
Pt states he began having left leg pain yesterday. Pt states he only has pain with ambulation. Pt also states he has a hx of blood clots, and takes a baby aspirin daily. Pt A&O, denies any sob. Dr. Ignacia Palma at bedside.

## 2012-10-01 NOTE — ED Notes (Signed)
Pt reports glucose was 110 this am

## 2012-10-01 NOTE — ED Notes (Signed)
Discharge instructions reviewed. Pt verbalized understanding.  

## 2012-10-01 NOTE — Procedures (Signed)
Procedure:  Right arm fistulogram with cephalic and central venous angioplasty Cephalic stenosis in arm treated with 8 mm angioplasty.  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  Good results.

## 2012-10-01 NOTE — ED Provider Notes (Signed)
History     CSN: [id number]  Arrival date & time 10/01/12  1016   First MD Initiated Contact with Patient 10/01/12 1111      Chief Complaint  Patient presents with  . Leg Pain    (Consider location/radiation/quality/duration/timing/severity/associated sxs/prior treatment) HPI Comments: Patient is a 67 year old man who is on hemodialysis for end-stage renal failure. He had been seen earlier today in the interventional radiology suite to have his AV shunt dilated. After that, he came to the ED because he has had pain in his left side starting yesterday. He feels the pain in his left lateral thigh, knee and is worse when he tries to walk. This morning he could not walk. When he is at rest he is pain-free, but when he tries to walk the pain goes up to a 10. He has a prior history of deep venous thrombosis in this leg about 18 months ago.  Patient is a 67 y.o. male presenting with leg pain. The history is provided by the patient and medical records. No language interpreter was used.  Leg Pain Location:  Leg Time since incident:  1 day Injury: no   Leg location:  L upper leg Pain details:    Quality:  Aching   Radiates to:  Does not radiate   Severity:  Severe   Onset quality:  Gradual   Duration:  1 day   Timing:  Intermittent   Progression:  Worsening Chronicity:  New Dislocation: no   Foreign body present:  No foreign bodies Prior injury to area:  No Relieved by:  Nothing Worsened by:  Nothing tried Ineffective treatments:  None tried Associated symptoms: no fever     Past Medical History  Diagnosis Date  . Diabetes mellitus   . Hypertension   . Myocardial infarction 1985; 1990  . Angina   . Coronary artery disease   . Dysrhythmia   . CHF (congestive heart failure)   . DVT (deep venous thrombosis) ~ 12/2010    LLE  . Anemia   . Blood transfusion 06/1999    post heart transplant  . Renal failure     Hemodialysis MWF    Past Surgical History  Procedure Laterality  Date  . Nephrectomy transplanted organ    . Av fistula repair      rt. a=fore arm fistula  . Av fistula placement  ~ 01/2010    "this was my 2nd fistula placement"  . Heart transplant  06/1999  . Coronary angioplasty with stent placement  1985; 1990  . Coronary artery bypass graft  1990    CABG X3  . Colonoscopy  03/17/2012    Procedure: COLONOSCOPY;  Surgeon: Shirley Friar, MD;  Location: [facility];  Service: Endoscopy;  Laterality: N/A;    History reviewed. No pertinent family history.  History  Substance Use Topics  . Smoking status: Former Smoker -- 1.00 packs/day for 20 years    Types: Cigarettes    Quit date: 05/27/1988  . Smokeless tobacco: Never Used  . Alcohol Use: Yes     Comment: "Holidays"      Review of Systems  Constitutional: Negative for fever and chills.  HENT: Negative.   Eyes: Negative.   Respiratory: Negative.   Cardiovascular: Negative.   Gastrointestinal: Negative.   Genitourinary: Negative.   Musculoskeletal:       Pain in his left thigh.  Skin: Negative.   Neurological: Negative.   Psychiatric/Behavioral: Negative.     Allergies  Lisinopril; Norvasc;  and Penicillins  Home Medications   Current Outpatient Rx  Name  Route  Sig  Dispense  Refill  . aspirin EC 81 MG tablet   Oral   Take 81 mg by mouth daily.           Marland Kitchen atorvastatin (LIPITOR) 40 MG tablet   Oral   Take 40 mg by mouth daily.           Marland Kitchen azaTHIOprine (IMURAN) 50 MG tablet   Oral   Take 75 mg by mouth daily.           Marland Kitchen EXPIRED: carvedilol (COREG) 25 MG tablet   Oral   Take 25-50 mg by mouth 2 (two) times daily with a meal. Takes 50mg  on Tue thurs sat and sun Takes 25 mg on mon wed and fri         . carvedilol (COREG) 25 MG tablet   Oral   Take 25 mg by mouth 2 (two) times daily with a meal. 50 mg on Tues Thurs Sat and Sun.  Take 25 mg on Mon Wed and Fri.         . colesevelam (WELCHOL) 625 MG tablet   Oral   Take 3,750 mg by mouth daily. Takes  6 tabs daily         . EXPIRED: insulin glargine (LANTUS) 100 UNIT/ML injection   Subcutaneous   Inject 25 Units into the skin daily.         . insulin glargine (LANTUS) 100 UNIT/ML injection   Subcutaneous   Inject 20 Units into the skin at bedtime.         . isosorbide dinitrate (ISORDIL) 40 MG tablet   Oral   Take 40 mg by mouth 3 (three) times daily.           . predniSONE (DELTASONE) 10 MG tablet   Oral   Take 10 mg by mouth daily.           . tacrolimus (PROGRAF) 1 MG capsule   Oral   Take 1 mg by mouth 2 (two) times daily.             BP 108/45  Pulse 67  Temp(Src) 97.6 F (36.4 C) (Oral)  Resp 16  SpO2 100%  Physical Exam  Nursing note and vitals reviewed. Constitutional: He is oriented to person, place, and time. He appears well-developed and well-nourished. No distress.  Pleasant elderly man in no distress at rest.  HENT:  Head: Normocephalic and atraumatic.  Right Ear: External ear normal.  Left Ear: External ear normal.  Mouth/Throat: Oropharynx is clear and moist.  Eyes: Conjunctivae and EOM are normal. Pupils are equal, round, and reactive to light.  Neck: Normal range of motion.  Cardiovascular: Normal rate, regular rhythm and normal heart sounds.   Pulmonary/Chest: Effort normal and breath sounds normal.  Abdominal: Soft. Bowel sounds are normal.  Musculoskeletal:  He localizes pain to the lateral aspect of the left side. It runs down to the lateral aspect of his left knee. There is no palpable deformity. He has no tenderness along the course of the saphenous were deep femoral vein. There is no calf tenderness. I could feel a posterior part but could not feel a dorsalis pedis pulse. His lower legs are very thin and show muscle wasting. The skin on his feet is normal, with good skin color and warmth.  Neurological: He is alert and oriented to person, place, and time.  No sensory  or motor deficit.  Skin: Skin is warm and dry.  Psychiatric:  He has a normal mood and affect. His behavior is normal.    ED Course  Procedures (including critical care time)  Labs Reviewed  CBC WITH DIFFERENTIAL  URINALYSIS, ROUTINE W REFLEX MICROSCOPIC  PROTIME-INR  APTT   Ir Pta Venous Right  10/01/2012  *RADIOLOGY REPORT*  Clinical Data:  Decreased flow rates via right arm dialysis AV fistula.  Status post prior central venous stent placement and angioplasty procedures.  1.  DIALYSIS AV FISTULOGRAM 2.  ANGIOPLASTY OF RIGHT CEPHALIC VEIN 3.  ADDITIONAL CENTRAL VENOUS ANGIOPLASTY AT THE LEVEL OF THE RIGHT SUBCLAVIAN AND INNOMINATE VEINS  Comparison:  Most recent intervention is dated 03/26/2012.  Contrast:  50 ml Omnipaque-300  Fluoroscopy Time: 3 minutes and 36 seconds  Procedure:  The procedure, risks, benefits, and alternatives were explained to the patient.  Questions regarding the procedure were encouraged and answered.  The patient understands and consents to the procedure.  The right arm dialysis fistula was prepped with Betadine in a sterile fashion, and a sterile drape was applied covering the operative field.  A diagnostic fistulogram was performed via an 18 gauge angiocatheter introduced into venous outflow.  Venous drainage was assessed to the level of the central veins in the chest.  Proximal fistula was studied by reflux maneuver with temporary compression of venous outflow.  The angiocath was removed and replaced with a 7-French sheath over a guidewire.  Balloon angioplasty of the right cephalic vein was then performed with a 8 mm x 4 cm Mustang balloon.  Balloon angioplasty was followed by additional angiography.  Additional angioplasty was performed at the level of the right subclavian and innominate veins with a 10 mm x 4 cm Conquest balloon.  The sheath was removed and hemostasis obtained with manual compression.  Complications: None  Findings:  Previously treated segment of narrowing in the arm at the level of the cephalic vein shows no  recurrent stenosis.  There is a new area of significant stenosis higher up in the arm at the level of the cephalic vein with narrowing approaching 80% in estimated caliber.  The restenosis is also present of the lateral subclavian vein which extends into a previously placed stent at the level of the subclavian vein that extends into the innominate vein. Narrowing throughout this segment is 80% in maximal caliber.  The anastomosis between the brachial artery and cephalic vein remains normally patent.  Other central veins show stable and normal patency including the central aspect of the previously stented innominate vein and the entire SVC.  Right upper arm cephalic stenosis showed significant improvement after 8 mm angioplasty with mild residual 25 - 30% narrowing remaining.  Central venous stenosis responded well to 10 mm balloon angioplasty with no significant residual stenosis identified.  IMPRESSION: New area of cephalic vein stenosis in the upper arm treated with 8 mm balloon angioplasty.  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  This responded very well to 10 mm balloon angioplasty.  Access Management: The fistula and venous outflow remain amenable to percutaneous intervention.   Original Report Authenticated By: Irish Lack, M.D.    Ir Pta Venous Right  10/01/2012  *RADIOLOGY REPORT*  Clinical Data:  Decreased flow rates via right arm dialysis AV fistula.  Status post prior central venous stent placement and angioplasty procedures.  1.  DIALYSIS AV FISTULOGRAM 2.  ANGIOPLASTY OF RIGHT CEPHALIC VEIN 3.  ADDITIONAL  CENTRAL VENOUS ANGIOPLASTY AT THE LEVEL OF THE RIGHT SUBCLAVIAN AND INNOMINATE VEINS  Comparison:  Most recent intervention is dated 03/26/2012.  Contrast:  50 ml Omnipaque-300  Fluoroscopy Time: 3 minutes and 36 seconds  Procedure:  The procedure, risks, benefits, and alternatives were explained to the patient.  Questions regarding  the procedure were encouraged and answered.  The patient understands and consents to the procedure.  The right arm dialysis fistula was prepped with Betadine in a sterile fashion, and a sterile drape was applied covering the operative field.  A diagnostic fistulogram was performed via an 18 gauge angiocatheter introduced into venous outflow.  Venous drainage was assessed to the level of the central veins in the chest.  Proximal fistula was studied by reflux maneuver with temporary compression of venous outflow.  The angiocath was removed and replaced with a 7-French sheath over a guidewire.  Balloon angioplasty of the right cephalic vein was then performed with a 8 mm x 4 cm Mustang balloon.  Balloon angioplasty was followed by additional angiography.  Additional angioplasty was performed at the level of the right subclavian and innominate veins with a 10 mm x 4 cm Conquest balloon.  The sheath was removed and hemostasis obtained with manual compression.  Complications: None  Findings:  Previously treated segment of narrowing in the arm at the level of the cephalic vein shows no recurrent stenosis.  There is a new area of significant stenosis higher up in the arm at the level of the cephalic vein with narrowing approaching 80% in estimated caliber.  The restenosis is also present of the lateral subclavian vein which extends into a previously placed stent at the level of the subclavian vein that extends into the innominate vein. Narrowing throughout this segment is 80% in maximal caliber.  The anastomosis between the brachial artery and cephalic vein remains normally patent.  Other central veins show stable and normal patency including the central aspect of the previously stented innominate vein and the entire SVC.  Right upper arm cephalic stenosis showed significant improvement after 8 mm angioplasty with mild residual 25 - 30% narrowing remaining.  Central venous stenosis responded well to 10 mm balloon  angioplasty with no significant residual stenosis identified.  IMPRESSION: New area of cephalic vein stenosis in the upper arm treated with 8 mm balloon angioplasty.  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  This responded very well to 10 mm balloon angioplasty.  Access Management: The fistula and venous outflow remain amenable to percutaneous intervention.   Original Report Authenticated By: Irish Lack, M.D.    Ir Shuntogram/fistulagram Right  10/01/2012  *RADIOLOGY REPORT*  Clinical Data:  Decreased flow rates via right arm dialysis AV fistula.  Status post prior central venous stent placement and angioplasty procedures.  1.  DIALYSIS AV FISTULOGRAM 2.  ANGIOPLASTY OF RIGHT CEPHALIC VEIN 3.  ADDITIONAL CENTRAL VENOUS ANGIOPLASTY AT THE LEVEL OF THE RIGHT SUBCLAVIAN AND INNOMINATE VEINS  Comparison:  Most recent intervention is dated 03/26/2012.  Contrast:  50 ml Omnipaque-300  Fluoroscopy Time: 3 minutes and 36 seconds  Procedure:  The procedure, risks, benefits, and alternatives were explained to the patient.  Questions regarding the procedure were encouraged and answered.  The patient understands and consents to the procedure.  The right arm dialysis fistula was prepped with Betadine in a sterile fashion, and a sterile drape was applied covering the operative field.  A diagnostic fistulogram was performed via an 18 gauge angiocatheter  introduced into venous outflow.  Venous drainage was assessed to the level of the central veins in the chest.  Proximal fistula was studied by reflux maneuver with temporary compression of venous outflow.  The angiocath was removed and replaced with a 7-French sheath over a guidewire.  Balloon angioplasty of the right cephalic vein was then performed with a 8 mm x 4 cm Mustang balloon.  Balloon angioplasty was followed by additional angiography.  Additional angioplasty was performed at the level of the right  subclavian and innominate veins with a 10 mm x 4 cm Conquest balloon.  The sheath was removed and hemostasis obtained with manual compression.  Complications: None  Findings:  Previously treated segment of narrowing in the arm at the level of the cephalic vein shows no recurrent stenosis.  There is a new area of significant stenosis higher up in the arm at the level of the cephalic vein with narrowing approaching 80% in estimated caliber.  The restenosis is also present of the lateral subclavian vein which extends into a previously placed stent at the level of the subclavian vein that extends into the innominate vein. Narrowing throughout this segment is 80% in maximal caliber.  The anastomosis between the brachial artery and cephalic vein remains normally patent.  Other central veins show stable and normal patency including the central aspect of the previously stented innominate vein and the entire SVC.  Right upper arm cephalic stenosis showed significant improvement after 8 mm angioplasty with mild residual 25 - 30% narrowing remaining.  Central venous stenosis responded well to 10 mm balloon angioplasty with no significant residual stenosis identified.  IMPRESSION: New area of cephalic vein stenosis in the upper arm treated with 8 mm balloon angioplasty.  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  This responded very well to 10 mm balloon angioplasty.  Access Management: The fistula and venous outflow remain amenable to percutaneous intervention.   Original Report Authenticated By: Irish Lack, M.D.    11:36 AM Patient was seen and had physical exam. Laboratory tests, ABIs, and venous Doppler of the left leg were ordered. Patient did not need pain medicine at present his he rated his pain as a 0 at present.   1:09 PM Results for orders placed during the hospital encounter of 10/01/12  POCT I-STAT, CHEM 8      Result Value Range   Sodium 139   135 - 145 mEq/L   Potassium 3.7  3.5 - 5.1 mEq/L   Chloride 98  96 - 112 mEq/L   BUN 33 (*) 6 - 23 mg/dL   Creatinine, Ser 1.61 (*) 0.50 - 1.35 mg/dL   Glucose, Bld 096 (*) 70 - 99 mg/dL   Calcium, Ion 0.45  4.09 - 1.30 mmol/L   TCO2 32  0 - 100 mmol/L   Hemoglobin 11.2 (*) 13.0 - 17.0 g/dL   HCT 81.1 (*) 91.4 - 78.2 %   VASCULAR LAB  PRELIMINARY PRELIMINARY PRELIMINARY PRELIMINARY  VASCULAR LAB  PRELIMINARY  ARTERIAL  ABI completed: Adaquate blood flow. Calcified vessels noted.   RIGHT    LEFT     PRESSURE  WAVEFORM   PRESSURE  WAVEFORM   BRACHIAL  fistula   BRACHIAL  140  T   DP    DP     AT  215  DM  AT  91  M   PT  160  M  PT  213  M   PER  PER     GREAT TOE   NA  GREAT TOE   NA     RIGHT  LEFT   ABI  1.14  0.65   KANADY, CANDACE, RVT  10/01/2012, 12:15 PM  VASCULAR LAB  PRELIMINARY PRELIMINARY PRELIMINARY PRELIMINARY  Left lower extremity venous Doppler completed.  Preliminary report: There is no DVT or SVT noted in the left lower extremity.  KANADY, CANDACE, RVT  10/01/2012, 12:12 PM   Lab tests showed renal failure, mild hyperglycemia.  ABIs were 1.14 on right, and 0.65 on left.  There was no DVT.  Results reported to pt.   Call to Dr. Waverly Ferrari, vascular surgeon, who advised that pt could be seen in the office, he should call for an appointment.  1:59 PM Did trial of walking.  He was able to walk all right between me and the nurse tech.  Rx hydrocodone-acetaminophen for pain.  Advised to call the [facility] office to be checked for intermittent claudication in the left leg.   1. Atherosclerosis of native artery of left lower extremity with intermittent claudication         Carleene Cooper III, MD 10/02/12 [id number]

## 2012-10-01 NOTE — ED Notes (Signed)
O2 d/c'd 

## 2012-10-01 NOTE — Progress Notes (Addendum)
VASCULAR LAB PRELIMINARY  PRELIMINARY  PRELIMINARY  PRELIMINARY VASCULAR LAB PRELIMINARY  ARTERIAL  ABI completed: Adaquate blood flow.  Calcified vessels noted.     RIGHT    LEFT    PRESSURE WAVEFORM  PRESSURE WAVEFORM  BRACHIAL fistula  BRACHIAL 140 T  DP   DP    AT 215 DM AT 91 M  PT 160 M PT 213 M  PER   PER    GREAT TOE  NA GREAT TOE  NA    RIGHT LEFT  ABI 1.14 0.65     Skylie Hiott, RVT 10/01/2012, 12:15 PM  Rebbie Lauricella, RVT 10/01/2012, 12:13 PM

## 2012-11-20 ENCOUNTER — Encounter: Payer: Medicare Other | Admitting: Vascular Surgery

## 2012-11-23 ENCOUNTER — Encounter: Payer: Self-pay | Admitting: Vascular Surgery

## 2012-11-23 ENCOUNTER — Other Ambulatory Visit: Payer: Self-pay | Admitting: *Deleted

## 2012-11-23 DIAGNOSIS — I70219 Atherosclerosis of native arteries of extremities with intermittent claudication, unspecified extremity: Secondary | ICD-10-CM

## 2012-11-24 ENCOUNTER — Encounter (INDEPENDENT_AMBULATORY_CARE_PROVIDER_SITE_OTHER): Payer: Medicare Other | Admitting: *Deleted

## 2012-11-24 ENCOUNTER — Encounter: Payer: Self-pay | Admitting: Vascular Surgery

## 2012-11-24 ENCOUNTER — Ambulatory Visit (INDEPENDENT_AMBULATORY_CARE_PROVIDER_SITE_OTHER): Payer: Medicare Other | Admitting: Vascular Surgery

## 2012-11-24 VITALS — BP 94/60 | HR 76 | Resp 16 | Ht 72.0 in | Wt 162.0 lb

## 2012-11-24 DIAGNOSIS — I739 Peripheral vascular disease, unspecified: Secondary | ICD-10-CM

## 2012-11-24 DIAGNOSIS — I70219 Atherosclerosis of native arteries of extremities with intermittent claudication, unspecified extremity: Secondary | ICD-10-CM

## 2012-11-24 DIAGNOSIS — M79609 Pain in unspecified limb: Secondary | ICD-10-CM | POA: Insufficient documentation

## 2012-11-24 NOTE — Progress Notes (Signed)
Subjective:     Patient ID: Brandon Sharp, male   DOB: 1946/05/13, 67 y.o.   MRN: [id number]  HPI this 67 year old male is referred for left leg claudication. This patient has type 1 diabetes mellitus. 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 He is currently dialyzed through a right upper arm AV fistula. He denies rest pain or history of infection gangrene and nonhealing ulcers in the extremities. His left calf causes discomfort with short distances. He is able to ambulate with a cane. He has no symptoms in the right leg. His saphenous vein in the left has been previously removed for coronary artery bypass grafting prior to his heart transplant.  Past Medical History  Diagnosis Date  . Diabetes mellitus   . Hypertension   . Myocardial infarction 1985; 1990  . Angina   . Coronary artery disease   . Dysrhythmia   . CHF (congestive heart failure)   . DVT (deep venous thrombosis) ~ 12/2010    LLE  . Anemia   . Blood transfusion 06/1999    post heart transplant  . Renal failure     Hemodialysis MWF  . Peripheral vascular disease     History  Substance Use Topics  . Smoking status: Former Smoker -- 1.00 packs/day for 20 years    Types: Cigarettes    Quit date: 05/27/1988  . Smokeless tobacco: Never Used  . Alcohol Use: Yes     Comment: "Holidays"    Family History  Problem Relation Age of Onset  . Heart disease Mother   . Hyperlipidemia Mother   . Hypertension Mother   . Hyperlipidemia Father   . Hypertension Father   . Diabetes Brother   . Heart disease Brother     Heart Disease before age 57  . Hyperlipidemia Brother   . Hypertension Brother     Allergies  Allergen Reactions  . Lisinopril Swelling    Lips and tongue swell  . Norvasc (Amlodipine Besylate) Rash    Flushing  . Penicillins Rash    Current outpatient prescriptions:aspirin EC 81 MG tablet, Take 81 mg by mouth daily.  , Disp: , Rfl: ;   atorvastatin (LIPITOR) 40 MG tablet, Take 40 mg by mouth daily.  , Disp: , Rfl: ;  azaTHIOprine (IMURAN) 50 MG tablet, Take 75 mg by mouth daily.  , Disp: , Rfl: ;  calcium acetate (PHOSLO) 667 MG capsule, daily., Disp: , Rfl:  carvedilol (COREG) 25 MG tablet, Take 25 mg by mouth 2 (two) times daily with a meal. 50 mg on Tues Thurs Sat and Sun.  Take 25 mg on Mon Wed and Fri., Disp: , Rfl: ;  colesevelam (WELCHOL) 625 MG tablet, Take 3,750 mg by mouth daily. Takes 6 tabs daily, Disp: , Rfl: ;  HUMALOG KWIKPEN 100 UNIT/ML SOPN, , Disp: , Rfl: ;  insulin glargine (LANTUS) 100 UNIT/ML injection, Inject 20 Units into the skin at bedtime., Disp: , Rfl:  isosorbide dinitrate (ISORDIL) 40 MG tablet, Take 40 mg by mouth 3 (three) times daily.  , Disp: , Rfl: ;  predniSONE (DELTASONE) 10 MG tablet, Take 10 mg by mouth daily.  , Disp: , Rfl: ;  tacrolimus (PROGRAF) 1 MG capsule, Take 1.5 mg by mouth 2 (two) times daily. , Disp: , Rfl:  carvedilol (COREG) 25 MG tablet, Take 25-50 mg by mouth 2 (two) times daily with a meal. Takes 50mg  on Tue thurs sat and  sun Takes 25 mg on mon wed and fri, Disp: , Rfl: ;  HYDROcodone-acetaminophen (NORCO/VICODIN) 5-325 MG per tablet, Take 1 tablet by mouth every 4 (four) hours as needed for pain., Disp: 20 tablet, Rfl: 0;  ILEVRO 0.3 % SUSP, , Disp: , Rfl:  insulin glargine (LANTUS) 100 UNIT/ML injection, Inject 25 Units into the skin daily., Disp: , Rfl: ;  [DISCONTINUED] doxazosin (CARDURA) 8 MG tablet, Take 8 mg by mouth 2 (two) times daily.  , Disp: , Rfl: ;  [DISCONTINUED] hydrALAZINE (APRESOLINE) 25 MG tablet, Take 75 mg by mouth 3 (three) times daily.  , Disp: , Rfl: ;  [DISCONTINUED] warfarin (COUMADIN) 2.5 MG tablet, Take 2.5 mg by mouth daily. , Disp: , Rfl:   BP 94/60  Pulse 76  Resp 16  Ht 6' (1.829 m)  Wt 162 lb (73.483 kg)  BMI 21.97 kg/m2  SpO2 99%  Body mass index is 21.97 kg/(m^2).           Review of Systems denies active chest pain or dyspnea on  exertion. Does have weakness in his legs and arms and pain with walking short distances in the left leg as noted in history and physical exam present illness. Denies lateralizing weakness, aphasia, amaurosis fugax, diplopia, blurred vision, or syncope. Other systems negative and complete review of systems    Objective:   Physical Exam blood pressure 94/60 heart rate 76 respirations 16 Gen.-alert and oriented x3 in no apparent distress HEENT normal for age Lungs no rhonchi or wheezing Cardiovascular regular rhythm no murmurs carotid pulses 3+ palpable no bruits audible Abdomen soft nontender no palpable masses Musculoskeletal free of  major deformities Skin clear -no rashes Neurologic normal Lower extremities femoral pulses 2+ bilaterally. No popliteal or distal pulses palpable bilaterally. Both feet adequately perfused with no evidence of infection, gangrene, or fluctuance or drainage.  Recent ABIs checked at the hospital was 1.14 on the right and 0.65 on the left. Vessels were noted to be very calcified. Today I ordered a duplex scan of both lower extremities which are reviewed and interpreted. There is diffusely calcified vessels throughout both lower extremities       Assessment:     Severely calcified distal vessels with left leg claudication -- diabetes mellitus    Plan:     Would not consider lower extremity revascularization unless patient was in danger of limb loss-gangrene, cellulitis, nonhealing ulcer etc. Lower extremities are well-perfused at this time and he is tolerating situation well will see  again on when necessary basis

## 2013-07-21 ENCOUNTER — Other Ambulatory Visit: Payer: Self-pay | Admitting: Gastroenterology

## 2013-07-22 NOTE — Addendum Note (Signed)
Addended by: Carol Loftin on: 07/22/2013 01:17 PM   Modules accepted: Orders  

## 2013-07-24 ENCOUNTER — Ambulatory Visit (HOSPITAL_COMMUNITY): Payer: Medicare Other

## 2013-07-24 ENCOUNTER — Encounter (HOSPITAL_COMMUNITY): Payer: Self-pay | Admitting: Emergency Medicine

## 2013-07-24 ENCOUNTER — Inpatient Hospital Stay (HOSPITAL_COMMUNITY)
Admission: EM | Admit: 2013-07-24 | Discharge: 2013-07-29 | DRG: 193 | Disposition: A | Discharge status: Home Health | Payer: Medicare Other | Attending: Family Medicine | Admitting: Family Medicine

## 2013-07-24 DIAGNOSIS — Z880 Allergy status to penicillin: Secondary | ICD-10-CM

## 2013-07-24 DIAGNOSIS — M79609 Pain in unspecified limb: Secondary | ICD-10-CM

## 2013-07-24 DIAGNOSIS — Z79899 Other long term (current) drug therapy: Secondary | ICD-10-CM

## 2013-07-24 DIAGNOSIS — I739 Peripheral vascular disease, unspecified: Secondary | ICD-10-CM | POA: Diagnosis present

## 2013-07-24 DIAGNOSIS — D631 Anemia in chronic kidney disease: Secondary | ICD-10-CM | POA: Diagnosis present

## 2013-07-24 DIAGNOSIS — N039 Chronic nephritic syndrome with unspecified morphologic changes: Secondary | ICD-10-CM

## 2013-07-24 DIAGNOSIS — I498 Other specified cardiac arrhythmias: Secondary | ICD-10-CM | POA: Diagnosis present

## 2013-07-24 DIAGNOSIS — R197 Diarrhea, unspecified: Secondary | ICD-10-CM | POA: Diagnosis present

## 2013-07-24 DIAGNOSIS — E118 Type 2 diabetes mellitus with unspecified complications: Secondary | ICD-10-CM

## 2013-07-24 DIAGNOSIS — R0902 Hypoxemia: Secondary | ICD-10-CM | POA: Diagnosis present

## 2013-07-24 DIAGNOSIS — Z992 Dependence on renal dialysis: Secondary | ICD-10-CM

## 2013-07-24 DIAGNOSIS — I70219 Atherosclerosis of native arteries of extremities with intermittent claudication, unspecified extremity: Secondary | ICD-10-CM

## 2013-07-24 DIAGNOSIS — Z87891 Personal history of nicotine dependence: Secondary | ICD-10-CM

## 2013-07-24 DIAGNOSIS — E785 Hyperlipidemia, unspecified: Secondary | ICD-10-CM | POA: Diagnosis present

## 2013-07-24 DIAGNOSIS — Z8249 Family history of ischemic heart disease and other diseases of the circulatory system: Secondary | ICD-10-CM

## 2013-07-24 DIAGNOSIS — Z86718 Personal history of other venous thrombosis and embolism: Secondary | ICD-10-CM

## 2013-07-24 DIAGNOSIS — Z905 Acquired absence of kidney: Secondary | ICD-10-CM

## 2013-07-24 DIAGNOSIS — D689 Coagulation defect, unspecified: Secondary | ICD-10-CM

## 2013-07-24 DIAGNOSIS — J1100 Influenza due to unidentified influenza virus with unspecified type of pneumonia: Principal | ICD-10-CM | POA: Diagnosis present

## 2013-07-24 DIAGNOSIS — J09X2 Influenza due to identified novel influenza A virus with other respiratory manifestations: Secondary | ICD-10-CM

## 2013-07-24 DIAGNOSIS — Z951 Presence of aortocoronary bypass graft: Secondary | ICD-10-CM

## 2013-07-24 DIAGNOSIS — E119 Type 2 diabetes mellitus without complications: Secondary | ICD-10-CM | POA: Diagnosis present

## 2013-07-24 DIAGNOSIS — IMO0002 Reserved for concepts with insufficient information to code with codable children: Secondary | ICD-10-CM

## 2013-07-24 DIAGNOSIS — Z9861 Coronary angioplasty status: Secondary | ICD-10-CM

## 2013-07-24 DIAGNOSIS — K5730 Diverticulosis of large intestine without perforation or abscess without bleeding: Secondary | ICD-10-CM | POA: Diagnosis present

## 2013-07-24 DIAGNOSIS — N185 Chronic kidney disease, stage 5: Secondary | ICD-10-CM

## 2013-07-24 DIAGNOSIS — I959 Hypotension, unspecified: Secondary | ICD-10-CM | POA: Diagnosis present

## 2013-07-24 DIAGNOSIS — I252 Old myocardial infarction: Secondary | ICD-10-CM

## 2013-07-24 DIAGNOSIS — Z794 Long term (current) use of insulin: Secondary | ICD-10-CM

## 2013-07-24 DIAGNOSIS — Z7982 Long term (current) use of aspirin: Secondary | ICD-10-CM

## 2013-07-24 DIAGNOSIS — R531 Weakness: Secondary | ICD-10-CM | POA: Diagnosis present

## 2013-07-24 DIAGNOSIS — Z941 Heart transplant status: Secondary | ICD-10-CM

## 2013-07-24 DIAGNOSIS — I120 Hypertensive chronic kidney disease with stage 5 chronic kidney disease or end stage renal disease: Secondary | ICD-10-CM | POA: Diagnosis present

## 2013-07-24 DIAGNOSIS — N184 Chronic kidney disease, stage 4 (severe): Secondary | ICD-10-CM

## 2013-07-24 DIAGNOSIS — Z888 Allergy status to other drugs, medicaments and biological substances status: Secondary | ICD-10-CM

## 2013-07-24 DIAGNOSIS — J189 Pneumonia, unspecified organism: Secondary | ICD-10-CM | POA: Diagnosis present

## 2013-07-24 DIAGNOSIS — E860 Dehydration: Secondary | ICD-10-CM | POA: Diagnosis present

## 2013-07-24 DIAGNOSIS — A0472 Enterocolitis due to Clostridium difficile, not specified as recurrent: Secondary | ICD-10-CM

## 2013-07-24 DIAGNOSIS — E1129 Type 2 diabetes mellitus with other diabetic kidney complication: Secondary | ICD-10-CM | POA: Diagnosis present

## 2013-07-24 DIAGNOSIS — Z833 Family history of diabetes mellitus: Secondary | ICD-10-CM

## 2013-07-24 DIAGNOSIS — I509 Heart failure, unspecified: Secondary | ICD-10-CM | POA: Diagnosis present

## 2013-07-24 DIAGNOSIS — I2510 Atherosclerotic heart disease of native coronary artery without angina pectoris: Secondary | ICD-10-CM | POA: Diagnosis present

## 2013-07-24 DIAGNOSIS — J9819 Other pulmonary collapse: Secondary | ICD-10-CM | POA: Diagnosis present

## 2013-07-24 DIAGNOSIS — N186 End stage renal disease: Secondary | ICD-10-CM

## 2013-07-24 DIAGNOSIS — N2581 Secondary hyperparathyroidism of renal origin: Secondary | ICD-10-CM | POA: Diagnosis present

## 2013-07-24 LAB — CREATININE, SERUM
CREATININE: 5.32 mg/dL — AB (ref 0.50–1.35)
GFR calc Af Amer: 12 mL/min — ABNORMAL LOW (ref >90)
GFR, EST NON AFRICAN AMERICAN: 10 mL/min — AB (ref >90)

## 2013-07-24 LAB — GLUCOSE, CAPILLARY
Glucose-Capillary: 99 mg/dL (ref 70–99)
Glucose-Capillary: 145 mg/dL — ABNORMAL HIGH (ref 70–99)

## 2013-07-24 LAB — CBC
HCT: 34.5 % — ABNORMAL LOW (ref 39.0–52.0)
Hemoglobin: 11.2 g/dL — ABNORMAL LOW (ref 13.0–17.0)
MCH: 35.1 pg — AB (ref 26.0–34.0)
MCHC: 32.5 g/dL (ref 30.0–36.0)
MCV: 108.2 fL — ABNORMAL HIGH (ref 78.0–100.0)
PLATELETS: 57 10*3/uL — AB (ref 150–400)
RBC: 3.19 MIL/uL — AB (ref 4.22–5.81)
RDW: 15.2 % (ref 11.5–15.5)
WBC: 4.6 10*3/uL (ref 4.0–10.5)

## 2013-07-24 LAB — CBC WITH DIFFERENTIAL/PLATELET
BASOS PCT: 0 % (ref 0–1)
Basophils Absolute: 0 10*3/uL (ref 0.0–0.1)
EOS ABS: 0 10*3/uL (ref 0.0–0.7)
Eosinophils Relative: 0 % (ref 0–5)
HEMATOCRIT: 35.4 % — AB (ref 39.0–52.0)
HEMOGLOBIN: 11.2 g/dL — AB (ref 13.0–17.0)
Lymphocytes Relative: 20 % (ref 12–46)
Lymphs Abs: 1.1 10*3/uL (ref 0.7–4.0)
MCH: 34.5 pg — AB (ref 26.0–34.0)
MCHC: 31.6 g/dL (ref 30.0–36.0)
MCV: 108.9 fL — ABNORMAL HIGH (ref 78.0–100.0)
MONO ABS: 0.4 10*3/uL (ref 0.1–1.0)
MONOS PCT: 7 % (ref 3–12)
NEUTROS PCT: 73 % (ref 43–77)
Neutro Abs: 4.1 10*3/uL (ref 1.7–7.7)
Platelets: 64 10*3/uL — ABNORMAL LOW (ref 150–400)
RBC: 3.25 MIL/uL — ABNORMAL LOW (ref 4.22–5.81)
RDW: 15 % (ref 11.5–15.5)
WBC: 5.7 10*3/uL (ref 4.0–10.5)

## 2013-07-24 LAB — EXPECTORATED SPUTUM ASSESSMENT W REFEX TO RESP CULTURE

## 2013-07-24 LAB — BASIC METABOLIC PANEL
BUN: 18 mg/dL (ref 6–23)
CALCIUM: 9 mg/dL (ref 8.4–10.5)
CO2: 34 mEq/L — ABNORMAL HIGH (ref 19–32)
CREATININE: 4.64 mg/dL — AB (ref 0.50–1.35)
Chloride: 98 mEq/L (ref 96–112)
GFR, EST AFRICAN AMERICAN: 14 mL/min — AB (ref >90)
GFR, EST NON AFRICAN AMERICAN: 12 mL/min — AB (ref >90)
Glucose, Bld: 102 mg/dL — ABNORMAL HIGH (ref 70–99)
Potassium: 3.7 mEq/L (ref 3.7–5.3)
Sodium: 145 mEq/L (ref 137–147)

## 2013-07-24 LAB — MRSA PCR SCREENING: MRSA BY PCR: NEGATIVE

## 2013-07-24 LAB — LACTIC ACID, PLASMA: Lactic Acid, Venous: 2.6 mmol/L — ABNORMAL HIGH (ref 0.5–2.2)

## 2013-07-24 LAB — INFLUENZA PANEL BY PCR (TYPE A & B)
H1N1 flu by pcr: NOT DETECTED
INFLAPCR: POSITIVE — AB
Influenza B By PCR: NEGATIVE

## 2013-07-24 LAB — HEMOGLOBIN A1C
Hgb A1c MFr Bld: 6.4 % — ABNORMAL HIGH (ref <5.7)
Mean Plasma Glucose: 137 mg/dL — ABNORMAL HIGH (ref <117)

## 2013-07-24 MED ORDER — ASPIRIN EC 81 MG PO TBEC
81.0000 mg | DELAYED_RELEASE_TABLET | Freq: Every day | ORAL | Status: DC
  Administered 2013-07-24 – 2013-07-29 (×6): 81 mg via ORAL
  Filled 2013-07-24 (×6): qty 1

## 2013-07-24 MED ORDER — SACCHAROMYCES BOULARDII 250 MG PO CAPS
250.0000 mg | ORAL_CAPSULE | Freq: Every day | ORAL | Status: DC
  Administered 2013-07-24 – 2013-07-29 (×6): 250 mg via ORAL
  Filled 2013-07-24 (×6): qty 1

## 2013-07-24 MED ORDER — DEXTROSE 5 % IV SOLN: 2.0000 g | Freq: Three times a day (TID) | INTRAVENOUS | Status: DC

## 2013-07-24 MED ORDER — INSULIN GLARGINE 100 UNIT/ML ~~LOC~~ SOLN
20.0000 [IU] | Freq: Every day | SUBCUTANEOUS | Status: DC
  Administered 2013-07-24 – 2013-07-27 (×4): 20 [IU] via SUBCUTANEOUS
  Filled 2013-07-24 (×6): qty 0.2

## 2013-07-24 MED ORDER — DEXTROSE 5 % IV SOLN
2.0000 g | Freq: Once | INTRAVENOUS | Status: AC
  Administered 2013-07-24: 2 g via INTRAVENOUS
  Filled 2013-07-24: qty 2

## 2013-07-24 MED ORDER — INSULIN ASPART 100 UNIT/ML ~~LOC~~ SOLN: 0.0000 [IU] | Freq: Every day | SUBCUTANEOUS | Status: DC

## 2013-07-24 MED ORDER — GUAIFENESIN-CODEINE 100-10 MG/5ML PO SOLN
5.0000 mL | Freq: Four times a day (QID) | ORAL | Status: DC | PRN
  Administered 2013-07-24 – 2013-07-25 (×2): 5 mL via ORAL
  Filled 2013-07-24 (×2): qty 5

## 2013-07-24 MED ORDER — AZATHIOPRINE 50 MG PO TABS
75.0000 mg | ORAL_TABLET | Freq: Every day | ORAL | Status: DC
  Administered 2013-07-24 – 2013-07-29 (×6): 75 mg via ORAL
  Filled 2013-07-24 (×6): qty 2

## 2013-07-24 MED ORDER — CALCIUM ACETATE 667 MG PO CAPS
667.0000 mg | ORAL_CAPSULE | Freq: Three times a day (TID) | ORAL | Status: DC
  Administered 2013-07-24 – 2013-07-29 (×12): 667 mg via ORAL
  Filled 2013-07-24 (×17): qty 1

## 2013-07-24 MED ORDER — PNEUMOCOCCAL VAC POLYVALENT 25 MCG/0.5ML IJ INJ
0.5000 mL | INJECTION | INTRAMUSCULAR | Status: AC
  Administered 2013-07-25: 0.5 mL via INTRAMUSCULAR
  Filled 2013-07-24: qty 0.5

## 2013-07-24 MED ORDER — ALBUTEROL SULFATE (2.5 MG/3ML) 0.083% IN NEBU
5.0000 mg | INHALATION_SOLUTION | Freq: Once | RESPIRATORY_TRACT | Status: AC
  Administered 2013-07-24: 5 mg via RESPIRATORY_TRACT
  Filled 2013-07-24: qty 6

## 2013-07-24 MED ORDER — VANCOMYCIN HCL IN DEXTROSE 750-5 MG/150ML-% IV SOLN
750.0000 mg | INTRAVENOUS | Status: DC
  Administered 2013-07-26 – 2013-07-28 (×2): 750 mg via INTRAVENOUS
  Filled 2013-07-24 (×4): qty 150

## 2013-07-24 MED ORDER — ATORVASTATIN CALCIUM 40 MG PO TABS
40.0000 mg | ORAL_TABLET | Freq: Every day | ORAL | Status: DC
  Administered 2013-07-24 – 2013-07-29 (×6): 40 mg via ORAL
  Filled 2013-07-24 (×6): qty 1

## 2013-07-24 MED ORDER — VANCOMYCIN HCL IN DEXTROSE 1-5 GM/200ML-% IV SOLN
1000.0000 mg | Freq: Once | INTRAVENOUS | Status: AC
  Administered 2013-07-24: 1000 mg via INTRAVENOUS
  Filled 2013-07-24: qty 200

## 2013-07-24 MED ORDER — INSULIN ASPART 100 UNIT/ML ~~LOC~~ SOLN
0.0000 [IU] | Freq: Three times a day (TID) | SUBCUTANEOUS | Status: DC
  Administered 2013-07-25: 1 [IU] via SUBCUTANEOUS
  Administered 2013-07-26: 2 [IU] via SUBCUTANEOUS
  Administered 2013-07-27 – 2013-07-28 (×3): 1 [IU] via SUBCUTANEOUS

## 2013-07-24 MED ORDER — TACROLIMUS 1 MG PO CAPS
1.5000 mg | ORAL_CAPSULE | Freq: Two times a day (BID) | ORAL | Status: DC
  Administered 2013-07-24 – 2013-07-29 (×10): 1.5 mg via ORAL
  Filled 2013-07-24 (×11): qty 1

## 2013-07-24 MED ORDER — PREDNISONE 10 MG PO TABS
10.0000 mg | ORAL_TABLET | Freq: Every day | ORAL | Status: DC
  Administered 2013-07-24 – 2013-07-29 (×6): 10 mg via ORAL
  Filled 2013-07-24 (×6): qty 1

## 2013-07-24 MED ORDER — ENOXAPARIN SODIUM 30 MG/0.3ML ~~LOC~~ SOLN
30.0000 mg | SUBCUTANEOUS | Status: DC
  Administered 2013-07-24 – 2013-07-28 (×5): 30 mg via SUBCUTANEOUS
  Filled 2013-07-24 (×6): qty 0.3

## 2013-07-24 MED ORDER — VANCOMYCIN HCL 10 G IV SOLR: 1500.0000 mg | Freq: Once | INTRAVENOUS | Status: DC

## 2013-07-24 MED ORDER — AZTREONAM 1 G IJ SOLR
1.0000 g | INTRAMUSCULAR | Status: DC
  Administered 2013-07-24 – 2013-07-26 (×3): 1 g via INTRAVENOUS
  Filled 2013-07-24 (×6): qty 1

## 2013-07-24 MED ORDER — VANCOMYCIN HCL 10 G IV SOLR
1600.0000 mg | Freq: Once | INTRAVENOUS | Status: DC
  Filled 2013-07-24: qty 1600

## 2013-07-24 MED ORDER — IPRATROPIUM-ALBUTEROL 0.5-2.5 (3) MG/3ML IN SOLN
3.0000 mL | Freq: Four times a day (QID) | RESPIRATORY_TRACT | Status: DC
  Administered 2013-07-24 – 2013-07-27 (×9): 3 mL via RESPIRATORY_TRACT
  Filled 2013-07-24 (×11): qty 3

## 2013-07-24 NOTE — ED Notes (Signed)
Dialysis pt c/o congestion in his chest, productive cough, poor appetite, diarrhea over past 1 1/2 weeks off and on.  Reports worse over past week.  Was on z pak, took last pill yesterday Dialysis cath in right arm on M, W, F.  Last dialysis yesterday.  Currently feeling nausea, attempting to vomit.

## 2013-07-24 NOTE — Progress Notes (Addendum)
ANTIBIOTIC CONSULT NOTE - INITIAL  Pharmacy Consult for Vancomycin Indication: pneumonia  Allergies  Allergen Reactions  . Lisinopril Swelling    Lips and tongue swell  . Norvasc [Amlodipine Besylate] Rash    Flushing  . Penicillins Rash    Patient Measurements:  Patient weight per RN is 72.14kg Adjusted Body Weight:   Vital Signs: Temp: 97.6 F (36.4 C) (02/28 1355) Temp src: Oral (02/28 0444) BP: 104/47 mmHg (02/28 1355) Pulse Rate: 56 (02/28 1355) Intake/Output from previous day:   Intake/Output from this shift:    Labs:  Recent Labs  07/24/13 0830  WBC 5.7  HGB 11.2*  PLT 64*  CREATININE 4.64*   The CrCl is unknown because both a height and weight (above a minimum accepted value) are required for this calculation. No results found for this basename: VANCOTROUGH, VANCOPEAK, VANCORANDOM, GENTTROUGH, GENTPEAK, GENTRANDOM, TOBRATROUGH, TOBRAPEAK, TOBRARND, AMIKACINPEAK, AMIKACINTROU, AMIKACIN,  in the last 72 hours   Microbiology: No results found for this or any previous visit (from the past 720 hour(s)).  Medical History: Past Medical History  Diagnosis Date  . Diabetes mellitus   . Hypertension   . Myocardial infarction 1985; 1990  . Angina   . Coronary artery disease   . Dysrhythmia   . CHF (congestive heart failure)   . DVT (deep venous thrombosis) ~ 12/2010    LLE  . Anemia   . Blood transfusion 06/1999    post heart transplant  . Renal failure     Hemodialysis MWF  . Peripheral vascular disease     Medications:  Prescriptions prior to admission  Medication Sig Dispense Refill  . aspirin EC 81 MG tablet Take 81 mg by mouth daily.        Marland Kitchen atorvastatin (LIPITOR) 40 MG tablet Take 40 mg by mouth daily.        Marland Kitchen azaTHIOprine (IMURAN) 50 MG tablet Take 75 mg by mouth daily.        Marland Kitchen azithromycin (ZITHROMAX) 250 MG tablet Take 250-500 mg by mouth daily. Last dose was 07/23/13      . calcium acetate (PHOSLO) 667 MG capsule Take 667 mg by mouth 3  (three) times daily with meals.       . colesevelam (WELCHOL) 625 MG tablet Take 3,750 mg by mouth daily. Takes 6 tabs daily      . HUMALOG KWIKPEN 100 UNIT/ML SOPN Inject 2-9 Units into the skin 3 (three) times daily. Per sliding scale      . insulin glargine (LANTUS) 100 UNIT/ML injection Inject 20 Units into the skin every morning.       . isosorbide dinitrate (ISORDIL) 40 MG tablet Take 40 mg by mouth 3 (three) times daily.        . predniSONE (DELTASONE) 10 MG tablet Take 10 mg by mouth daily.        . tacrolimus (PROGRAF) 1 MG capsule Take 1.5 mg by mouth 2 (two) times daily.       . carvedilol (COREG) 25 MG tablet Take 25-50 mg by mouth 2 (two) times daily with a meal. Takes 50mg  on Tue thurs sat and sun Takes 25 mg on mon wed and fri      . carvedilol (COREG) 25 MG tablet Take 25 mg by mouth 2 (two) times daily with a meal. 50 mg on Tues Thurs Sat and Sun.  Take 25 mg on Mon Wed and Fri.       Assessment: 68 yo M with ESRD on  HD MWF and DM has been having increasing cough and congestion x 2 weeks. Patient completed Zithromax outpatient by his physician on Monday, 5 days ago but his symptoms have not been improving. Patient also has subjective fevers and myalgias. He also endorsed having diarrhea which started on Monday. He had one episode of vomiting today. Patient feels loss of appetite in increasing generalized weakness. He had his last dialysis yesterday on Friday. Chest x-ray in the ER showed COPD, bibasilar airspace opacifications reflecting atelectasis or pneumonia  Infectious Disease: PNA; afebrile, wbc wnl  Vanc 2/28>> Aztreonam 2/28 (rec 2g x1)>> -dose adjusted for aztreonam to 1g IV q24h post HD  2/28 bld cx:  Goal of Therapy:  Vancomycin trough level 15-20 mcg/ml  Plan:  Measure antibiotic drug levels at steady state -dose adjusted aztreonam for HD dosing.  Changed from 2g q8h to 1g q24h post-HD -Load Vanc 1500 mg IV x1 -Then Vanc 750 mg IV post-HD on MWF -Monitor  fever curve, WBC, resolution of infection, pre-HD Vanc levels as appropriate -F/up HD schedule/plans  Jeronimo Norma, PharmD Clinical Pharmacist Resident Pager: [phone]  Jeronimo Norma 07/24/2013,2:45 PM

## 2013-07-24 NOTE — ED Notes (Signed)
The pt has a cold  Coughing with diarrhea since  Sunday.  He was given a z-pac by his doctor on Monday.  Dialysis  Pt rt upper arm last dialyzed today.

## 2013-07-24 NOTE — Progress Notes (Signed)
Pt coughed up sputum sample.  Sample in sterile cup, labeled, and sent to the lab w/ requisition.

## 2013-07-24 NOTE — ED Provider Notes (Signed)
CSN: [id number]     Arrival date & time 07/24/13  1761 History   First MD Initiated Contact with Patient 07/24/13 [phone]     Chief Complaint  Patient presents with  . Diarrhea      HPI Patient presents emergency department with increasing cough and congestion over the past 2 weeks.  Patient is end-stage renal disease and dialyzes Monday Wednesday Friday.  He presents with several days of diarrhea.  He was started on azithromycin by his physicians for suspected pneumonia 5 days ago and was placed on azithromycin at that time.  The patient is now completed his antibiotics and continues to feel some shortness of breath.  Family reports that he has labored breathing.  No recent hospitalization.  He states his diarrhea was present on Monday and then again today.  No vomiting.  No rash.  Altered metal status.  Patient reports that at dialysis he has been taken back down to his dry weight.   Past Medical History  Diagnosis Date  . Diabetes mellitus   . Hypertension   . Myocardial infarction 1985; 1990  . Angina   . Coronary artery disease   . Dysrhythmia   . CHF (congestive heart failure)   . DVT (deep venous thrombosis) ~ 12/2010    LLE  . Anemia   . Blood transfusion 06/1999    post heart transplant  . Renal failure     Hemodialysis MWF  . Peripheral vascular disease    Past Surgical History  Procedure Laterality Date  . Nephrectomy transplanted organ    . Av fistula repair      rt. a=fore arm fistula  . Av fistula placement  ~ 01/2010    "this was my 2nd fistula placement"  . Heart transplant  06/1999  . Coronary angioplasty with stent placement  1985; 1990  . Colonoscopy  03/17/2012    Procedure: COLONOSCOPY;  Surgeon: Shirley Friar, MD;  Location: [facility];  Service: Endoscopy;  Laterality: N/A;  . Coronary artery bypass graft  1990    CABG X3   Family History  Problem Relation Age of Onset  . Heart disease Mother   . Hyperlipidemia Mother   . Hypertension Mother    . Hyperlipidemia Father   . Hypertension Father   . Diabetes Brother   . Heart disease Brother     Heart Disease before age 35  . Hyperlipidemia Brother   . Hypertension Brother    History  Substance Use Topics  . Smoking status: Former Smoker -- 1.00 packs/day for 20 years    Types: Cigarettes    Quit date: 05/27/1988  . Smokeless tobacco: Never Used  . Alcohol Use: Yes     Comment: "Holidays"    Review of Systems  All other systems reviewed and are negative.      Allergies  Lisinopril; Norvasc; and Penicillins  Home Medications   Current Outpatient Rx  Name  Route  Sig  Dispense  Refill  . aspirin EC 81 MG tablet   Oral   Take 81 mg by mouth daily.           Marland Kitchen atorvastatin (LIPITOR) 40 MG tablet   Oral   Take 40 mg by mouth daily.           Marland Kitchen azaTHIOprine (IMURAN) 50 MG tablet   Oral   Take 75 mg by mouth daily.           Marland Kitchen azithromycin (ZITHROMAX) 250 MG tablet  Oral   Take 250-500 mg by mouth daily. Last dose was 07/23/13         . calcium acetate (PHOSLO) 667 MG capsule   Oral   Take 667 mg by mouth 3 (three) times daily with meals.          . colesevelam (WELCHOL) 625 MG tablet   Oral   Take 3,750 mg by mouth daily. Takes 6 tabs daily         . HUMALOG KWIKPEN 100 UNIT/ML SOPN   Subcutaneous   Inject 2-9 Units into the skin 3 (three) times daily. Per sliding scale         . insulin glargine (LANTUS) 100 UNIT/ML injection   Subcutaneous   Inject 20 Units into the skin every morning.          . isosorbide dinitrate (ISORDIL) 40 MG tablet   Oral   Take 40 mg by mouth 3 (three) times daily.           . predniSONE (DELTASONE) 10 MG tablet   Oral   Take 10 mg by mouth daily.           . tacrolimus (PROGRAF) 1 MG capsule   Oral   Take 1.5 mg by mouth 2 (two) times daily.          Marland Kitchen EXPIRED: carvedilol (COREG) 25 MG tablet   Oral   Take 25-50 mg by mouth 2 (two) times daily with a meal. Takes 50mg  on Tue thurs sat and  sun Takes 25 mg on mon wed and fri         . carvedilol (COREG) 25 MG tablet   Oral   Take 25 mg by mouth 2 (two) times daily with a meal. 50 mg on Tues Thurs Sat and Sun.  Take 25 mg on Mon Wed and Fri.          BP 92/44  Pulse 62  Temp(Src) 97.6 F (36.4 C) (Oral)  Resp 18  SpO2 94% Physical Exam  Nursing note and vitals reviewed. Constitutional: He is oriented to person, place, and time. He appears well-developed and well-nourished.  HENT:  Head: Normocephalic and atraumatic.  Eyes: EOM are normal.  Neck: Normal range of motion.  Cardiovascular: Normal rate, regular rhythm, normal heart sounds and intact distal pulses.   Pulmonary/Chest: Effort normal. No respiratory distress.  Rhonchi bilateral bases  Abdominal: Soft. He exhibits no distension. There is no tenderness.  Musculoskeletal: Normal range of motion.  Neurological: He is alert and oriented to person, place, and time.  Skin: Skin is warm and dry.  Psychiatric: He has a normal mood and affect. Judgment normal.    ED Course  Procedures (including critical care time) Labs Review Labs Reviewed  CULTURE, BLOOD (ROUTINE X 2)  CULTURE, BLOOD (ROUTINE X 2)  CBC WITH DIFFERENTIAL  BASIC METABOLIC PANEL  LACTIC ACID, PLASMA   Imaging Review Dg Chest 2 View  07/24/2013   CLINICAL DATA:  Diarrhea for 3 days.  EXAM: CHEST  2 VIEW  COMPARISON:  None.  FINDINGS: The lungs are hyperexpanded, with flattening of the hemidiaphragms, compatible with COPD. Bibasilar airspace opacification may reflect atelectasis or pneumonia. No pleural effusion or pneumothorax is seen.  The heart is borderline normal in size. The patient is status post median sternotomy. There are fractures of the three superiormost sternal wires. No acute osseous abnormalities are seen.  IMPRESSION: Findings of COPD. Bibasilar airspace opacification may reflect atelectasis or pneumonia.  Electronically Signed   By: Garald Balding M.D.   On: 07/24/2013 05:44   I personally reviewed the imaging tests through PACS system I reviewed available ER/hospitalization records through the EMR    EKG Interpretation None      MDM   Final diagnoses:  None    Bibasilar pneumonia.  Patient be treated for healthcare associated pneumonia given his dialysis status as well as failed outpatient antibiotics with azithromycin.  The significant hypoxia this time.  Individual benefit from hospitalization.  Patient be placed on appropriate healthcare associated pneumonia antibiotics.    Hoy Morn, MD 07/24/13 [phone]

## 2013-07-24 NOTE — H&P (Signed)
History and Physical       Hospital Admission Note Date: 07/24/2013  Patient name: Brandon Sharp Medical record number: [id number] Date of birth: December 26, 1945 Age: 68 y.o. Gender: male  PCP: [name], MD    Chief Complaint:  Coughing with productive phlegm and diarrhea   HPI: Patient is a 68 year old male with ESRD on HD MWF, diabetes has been having increasing cough and congestion over the last 2 weeks. Patient reported that he was placed on Zithromax by his physician on Monday, 5 days ago but his symptoms have not been improving. Patient has completed the Zithromax, felt congested still, productive cough with yellowish to greenish sputum, subjective fevers and myalgias. He also endorsed having diarrhea which started on Monday. He had one episode of vomiting today. Patient feels loss of appetite in increasing generalized weakness. He had his last dialysis yesterday on Friday. Chest x-ray in the ER showed COPD, bibasilar airspace opacifications reflecting atelectasis or pneumonia  Review of Systems:  Constitutional: +fever, chills, diaphoresis, poor appetite and fatigue.  HEENT: Denies photophobia, eye pain, redness, hearing loss, ear pain, congestion, sore throat, rhinorrhea, sneezing, mouth sores, trouble swallowing, neck pain, neck stiffness and tinnitus.   Respiratory:  see history of present illness  Cardiovascular: Denies chest pain, palpitations and leg swelling.  Gastrointestinal:  see history of present illness  Genitourinary: Denies dysuria, urgency, frequency, hematuria, flank pain and difficulty urinating.  Musculoskeletal: + myalgias,  denies back pain, joint swelling, arthralgias and gait problem.  Skin: Denies pallor, rash and wound.  Neurological: Denies dizziness, seizures, syncope, + generalized weakness,  denies light-headedness, numbness and headaches.  Hematological: Denies adenopathy. Easy bruising,  personal or family bleeding history  Psychiatric/Behavioral: Denies suicidal ideation, mood changes, confusion, nervousness, sleep disturbance and agitation  Past Medical History: Past Medical History  Diagnosis Date  . Diabetes mellitus   . Hypertension   . Myocardial infarction 1985; 1990  . Angina   . Coronary artery disease   . Dysrhythmia   . CHF (congestive heart failure)   . DVT (deep venous thrombosis) ~ 12/2010    LLE  . Anemia   . Blood transfusion 06/1999    post heart transplant  . Renal failure     Hemodialysis MWF  . Peripheral vascular disease    Past Surgical History  Procedure Laterality Date  . Nephrectomy transplanted organ    . Av fistula repair      rt. a=fore arm fistula  . Av fistula placement  ~ 01/2010    "this was my 2nd fistula placement"  . Heart transplant  06/1999  . Coronary angioplasty with stent placement  1985; 1990  . Colonoscopy  03/17/2012    Procedure: COLONOSCOPY;  Surgeon: Lear Ng, MD;  Location: [facility];  Service: Endoscopy;  Laterality: N/A;  . Coronary artery bypass graft  1990    CABG X3    Medications: Prior to Admission medications   Medication Sig Start Date End Date Taking? Authorizing Provider  aspirin EC 81 MG tablet Take 81 mg by mouth daily.     Yes Historical Provider, MD  atorvastatin (LIPITOR) 40 MG tablet Take 40 mg by mouth daily.     Yes Historical Provider, MD  azaTHIOprine (IMURAN) 50 MG tablet Take 75 mg by mouth daily.     Yes Historical Provider, MD  azithromycin (ZITHROMAX) 250 MG tablet Take 250-500 mg by mouth daily. Last dose was 07/23/13   Yes Historical Provider, MD  calcium acetate (PHOSLO) 667  MG capsule Take 667 mg by mouth 3 (three) times daily with meals.  11/01/12  Yes Historical Provider, MD  colesevelam (WELCHOL) 625 MG tablet Take 3,750 mg by mouth daily. Takes 6 tabs daily   Yes Historical Provider, MD  HUMALOG KWIKPEN 100 UNIT/ML SOPN Inject 2-9 Units into the skin 3 (three) times  daily. Per sliding scale 11/17/12  Yes Historical Provider, MD  insulin glargine (LANTUS) 100 UNIT/ML injection Inject 20 Units into the skin every morning.    Yes Historical Provider, MD  isosorbide dinitrate (ISORDIL) 40 MG tablet Take 40 mg by mouth 3 (three) times daily.     Yes Historical Provider, MD  predniSONE (DELTASONE) 10 MG tablet Take 10 mg by mouth daily.     Yes Historical Provider, MD  tacrolimus (PROGRAF) 1 MG capsule Take 1.5 mg by mouth 2 (two) times daily.    Yes Historical Provider, MD  carvedilol (COREG) 25 MG tablet Take 25-50 mg by mouth 2 (two) times daily with a meal. Takes 50mg  on Tue thurs sat and sun Takes 25 mg on mon wed and fri 04/19/11 04/18/12  Erline Hau, MD  carvedilol (COREG) 25 MG tablet Take 25 mg by mouth 2 (two) times daily with a meal. 50 mg on Tues Thurs Sat and Sun.  Take 25 mg on Mon Wed and Fri.    Historical Provider, MD    Allergies:   Allergies  Allergen Reactions  . Lisinopril Swelling    Lips and tongue swell  . Norvasc [Amlodipine Besylate] Rash    Flushing  . Penicillins Rash    Social History:  reports that he quit smoking about 25 years ago. His smoking use included Cigarettes. He has a 20 pack-year smoking history. He has never used smokeless tobacco. He reports that he drinks alcohol. He reports that he does not use illicit drugs.  Family History: Family History  Problem Relation Age of Onset  . Heart disease Mother   . Hyperlipidemia Mother   . Hypertension Mother   . Hyperlipidemia Father   . Hypertension Father   . Diabetes Brother   . Heart disease Brother     Heart Disease before age 63  . Hyperlipidemia Brother   . Hypertension Brother     Physical Exam: Blood pressure 97/52, pulse 59, temperature 97.6 F (36.4 C), temperature source Oral, resp. rate 17, SpO2 95.00%. General: Alert, awake, oriented x3, in no acute distress. HEENT: normocephalic, atraumatic, anicteric sclera, pink conjunctiva,  pupils equal and reactive to light and accomodation, oropharynx clear Neck: supple, no masses or lymphadenopathy, no goiter, no bruits  Heart: Regular rate and rhythm, without murmurs, rubs or gallops. Lungs:  decreased breath sound at the bases with rhonchi  Abdomen: Soft, nontender, nondistended, positive bowel sounds, no masses. Extremities: No clubbing, cyanosis or edema with positive pedal pulses. Neuro: Grossly intact, no focal neurological deficits, strength 5/5 upper and lower extremities bilaterally Psych: alert and oriented x 3, normal mood and affect Skin: no rashes or lesions, warm and dry   LABS on Admission:  Basic Metabolic Panel:  Recent Labs Lab 07/24/13 0830  NA 145  K 3.7  CL 98  CO2 34*  GLUCOSE 102*  BUN 18  CREATININE 4.64*  CALCIUM 9.0   Liver Function Tests: No results found for this basename: AST, ALT, ALKPHOS, BILITOT, PROT, ALBUMIN,  in the last 168 hours No results found for this basename: LIPASE, AMYLASE,  in the last 168 hours No results found  for this basename: AMMONIA,  in the last 168 hours CBC:  Recent Labs Lab 07/24/13 0830  WBC 5.7  NEUTROABS 4.1  HGB 11.2*  HCT 35.4*  MCV 108.9*  PLT 64*   Cardiac Enzymes: No results found for this basename: CKTOTAL, CKMB, CKMBINDEX, TROPONINI,  in the last 168 hours BNP: No components found with this basename: POCBNP,  CBG: No results found for this basename: GLUCAP,  in the last 168 hours   Radiological Exams on Admission: Dg Chest 2 View  07/24/2013   CLINICAL DATA:  Diarrhea for 3 days.  EXAM: CHEST  2 VIEW  COMPARISON:  None.  FINDINGS: The lungs are hyperexpanded, with flattening of the hemidiaphragms, compatible with COPD. Bibasilar airspace opacification may reflect atelectasis or pneumonia. No pleural effusion or pneumothorax is seen.  The heart is borderline normal in size. The patient is status post median sternotomy. There are fractures of the three superiormost sternal wires. No  acute osseous abnormalities are seen.  IMPRESSION: Findings of COPD. Bibasilar airspace opacification may reflect atelectasis or pneumonia.   Electronically Signed   By: Garald Balding M.D.   On: 07/24/2013 05:44    Assessment/Plan Principal Problem:   HCAP (healthcare-associated pneumonia): Outpatient failure to treatment with Zithromax - Patient will be admitted for IV antibiotics, place on vancomycin and aztreonam, scheduled bronchodilators, O2 via nasal cannula,   supportive management  - Obtain flu PCR, urine legionella antigen, urine strep antigen, sputum culture, blood cultures   Active Problems:  Diarrhea and dehydration - Patient reports diarrhea prior to starting the Zithromax however we will still check C. difficile PCR and GI pathogen panel. Placed on probiotic. He states that is improving but appears to be dehydrated. - Will hold off on Coreg due to borderline hypotension    DM type 2 causing complication - Placed on sliding scale insulin     ESRD on hemodialysis MWF - Renal (Dr Jonnie Finner) has been notified, patient will need next hemodialysis on Monday     Generalized weakness - PTOT evaluation   DVT prophylaxis:  Lovenox   CODE STATUS:  full code   Family Communication: Admission, patients condition and plan of care including tests being ordered have been discussed with the patient who indicates understanding and agree with the plan and Code Status   Further plan will depend as patient's clinical course evolves and further radiologic and laboratory data become available.   Time Spent on Admission: 1 hour  Brenon Antosh M.D. [facility] 07/24/2013, 12:25 PM Pager: [phone]  If 7PM-7AM, please contact night-coverage [email or web address] Password [email or web address]

## 2013-07-24 NOTE — Progress Notes (Signed)
07/24/13 1300 nsg To unit 6E alert and oriented patient on contact for respiratory and r/o cdiff. Oriented to unit set up by Charge RN;  Needs attended to no skin issues noted. Continued to monitor.

## 2013-07-25 DIAGNOSIS — J09X2 Influenza due to identified novel influenza A virus with other respiratory manifestations: Secondary | ICD-10-CM

## 2013-07-25 DIAGNOSIS — E118 Type 2 diabetes mellitus with unspecified complications: Secondary | ICD-10-CM

## 2013-07-25 DIAGNOSIS — N186 End stage renal disease: Secondary | ICD-10-CM

## 2013-07-25 DIAGNOSIS — Z992 Dependence on renal dialysis: Secondary | ICD-10-CM

## 2013-07-25 DIAGNOSIS — A0472 Enterocolitis due to Clostridium difficile, not specified as recurrent: Secondary | ICD-10-CM

## 2013-07-25 LAB — BASIC METABOLIC PANEL
BUN: 29 mg/dL — AB (ref 6–23)
CO2: 27 mEq/L (ref 19–32)
Calcium: 8.6 mg/dL (ref 8.4–10.5)
Chloride: 96 mEq/L (ref 96–112)
Creatinine, Ser: 6.41 mg/dL — ABNORMAL HIGH (ref 0.50–1.35)
GFR calc non Af Amer: 8 mL/min — ABNORMAL LOW (ref >90)
GFR, EST AFRICAN AMERICAN: 9 mL/min — AB (ref >90)
Glucose, Bld: 73 mg/dL (ref 70–99)
POTASSIUM: 4.4 meq/L (ref 3.7–5.3)
Sodium: 140 mEq/L (ref 137–147)

## 2013-07-25 LAB — CBC
HCT: 34.9 % — ABNORMAL LOW (ref 39.0–52.0)
HEMOGLOBIN: 11.1 g/dL — AB (ref 13.0–17.0)
MCH: 33.9 pg (ref 26.0–34.0)
MCHC: 31.8 g/dL (ref 30.0–36.0)
MCV: 106.7 fL — ABNORMAL HIGH (ref 78.0–100.0)
Platelets: 69 10*3/uL — ABNORMAL LOW (ref 150–400)
RBC: 3.27 MIL/uL — ABNORMAL LOW (ref 4.22–5.81)
RDW: 15.2 % (ref 11.5–15.5)
WBC: 5 10*3/uL (ref 4.0–10.5)

## 2013-07-25 LAB — TROPONIN I: Troponin I: <0.3 ng/mL (ref <0.30)

## 2013-07-25 LAB — GLUCOSE, CAPILLARY
GLUCOSE-CAPILLARY: 145 mg/dL — AB (ref 70–99)
GLUCOSE-CAPILLARY: 150 mg/dL — AB (ref 70–99)
Glucose-Capillary: 73 mg/dL (ref 70–99)
Glucose-Capillary: 106 mg/dL — ABNORMAL HIGH (ref 70–99)

## 2013-07-25 LAB — CLOSTRIDIUM DIFFICILE BY PCR: CDIFFPCR: POSITIVE — AB

## 2013-07-25 MED ORDER — OSELTAMIVIR PHOSPHATE 75 MG PO CAPS: 75.0000 mg | ORAL_CAPSULE | Freq: Two times a day (BID) | ORAL | Status: DC

## 2013-07-25 MED ORDER — OSELTAMIVIR PHOSPHATE 30 MG PO CAPS
30.0000 mg | ORAL_CAPSULE | Freq: Once | ORAL | Status: AC
  Administered 2013-07-25: 30 mg via ORAL
  Filled 2013-07-25: qty 1

## 2013-07-25 MED ORDER — BUDESONIDE 0.25 MG/2ML IN SUSP
0.2500 mg | Freq: Two times a day (BID) | RESPIRATORY_TRACT | Status: DC
  Administered 2013-07-25 – 2013-07-29 (×8): 0.25 mg via RESPIRATORY_TRACT
  Filled 2013-07-25 (×11): qty 2

## 2013-07-25 MED ORDER — NEPRO/CARBSTEADY PO LIQD
237.0000 mL | Freq: Two times a day (BID) | ORAL | Status: DC
  Administered 2013-07-25: 237 mL via ORAL

## 2013-07-25 MED ORDER — OSELTAMIVIR PHOSPHATE 30 MG PO CAPS
30.0000 mg | ORAL_CAPSULE | ORAL | Status: DC
  Administered 2013-07-26 – 2013-07-28 (×2): 30 mg via ORAL
  Filled 2013-07-25 (×2): qty 1

## 2013-07-25 MED ORDER — VANCOMYCIN HCL 10 G IV SOLR
1500.0000 mg | Freq: Once | INTRAVENOUS | Status: AC
  Administered 2013-07-25: 1500 mg via INTRAVENOUS
  Filled 2013-07-25: qty 1500

## 2013-07-25 MED ORDER — ALBUTEROL SULFATE (2.5 MG/3ML) 0.083% IN NEBU
2.5000 mg | INHALATION_SOLUTION | RESPIRATORY_TRACT | Status: DC | PRN
  Administered 2013-07-25: 2.5 mg via RESPIRATORY_TRACT
  Filled 2013-07-25: qty 3

## 2013-07-25 MED ORDER — METRONIDAZOLE 500 MG PO TABS
500.0000 mg | ORAL_TABLET | Freq: Three times a day (TID) | ORAL | Status: DC
  Administered 2013-07-25 – 2013-07-29 (×12): 500 mg via ORAL
  Filled 2013-07-25 (×15): qty 1

## 2013-07-25 MED ORDER — ACETAMINOPHEN 325 MG PO TABS
650.0000 mg | ORAL_TABLET | Freq: Four times a day (QID) | ORAL | Status: DC | PRN
  Administered 2013-07-25: 650 mg via ORAL
  Filled 2013-07-25: qty 2

## 2013-07-25 MED ORDER — RENA-VITE PO TABS
1.0000 | ORAL_TABLET | Freq: Every day | ORAL | Status: DC
  Administered 2013-07-25: 21:00:00 via ORAL
  Administered 2013-07-26 – 2013-07-28 (×3): 1 via ORAL
  Filled 2013-07-25 (×5): qty 1

## 2013-07-25 NOTE — Progress Notes (Signed)
[facility] PROGRESS NOTE  Brandon Sharp Brandon Sharp [id number] DOB: 12-Feb-1946 DOA: 07/24/2013 PCP: Eartha InchBADGER,[name], MD  Assessment/Plan: 1-SOB/cough/fever: secondary to HCAP and influenza -will continue broad spectrum antibiotics -start tamiflu and pulmicort -continue supportive care -PRN antitussives and antipyretics   2-Sharp. Diff enteritis: -started on flagyl -patient on contact precautions  3-DM type 2: -last A1C 6.4 (2/28) -continue SSI and lantus  4-ESRD: renal service contacted to continue HD while inpatient. -will follow rec's  5-Generalized weakness and deconditioning: due to #1. -PT has evaluated patient and is recommending HHPT at discharge. Will follow progress and response; patient currently declining any services.  6-HLD: continue statin  7-CAD and bradycardia: status post PCI 2 months ago. -will repeat 2-d echo, cycle troponin, check TSH and hold coreg at this moment  DVT: lovenox  Code Status: Full Family Communication: no family at bedside  Disposition Plan: home when medically stable (currently declining HHPT)   Consultants:  Renal service  Procedures:  See below for x-ray reports  Antibiotics:  Aztreonam 2/28  Vancomycin 2/28  Flagyl 3/01  tamiflu 3/01  HPI/Subjective: Still with diarrhea and fever; feeling slightly better. Positive influenza A.  Objective: Filed Vitals:   07/25/13 1141  BP:   Pulse: 82  Temp:   Resp:     Intake/Output Summary (Last 24 hours) at 07/25/13 1348 Last data filed at 07/25/13 1300  Gross per 24 hour  Intake    360 ml  Output      0 ml  Net    360 ml   Filed Weights   07/24/13 1500  Weight: 72.14 kg (159 lb 0.6 oz)    Exam:   General:  Positive fever overnight, feeling slightly better; still with diarrhea  Cardiovascular:S1 and S2, no rubs or gallops  Respiratory: diffuse rhonchi, decrease air movement, mild exp wheezing   Abdomen: soft, ND, positive BS  Musculoskeletal: no joint  swelling or erythema  Data Reviewed: Basic Metabolic Panel:  Recent Labs Lab 07/24/13 0830 07/24/13 1700 07/25/13 0636  NA 145  --  140  K 3.7  --  4.4  CL 98  --  96  CO2 34*  --  27  GLUCOSE 102*  --  73  BUN 18  --  29*  CREATININE 4.64* 5.32* 6.41*  CALCIUM 9.0  --  8.6   CBC:  Recent Labs Lab 07/24/13 0830 07/24/13 1700 07/25/13 0636  WBC 5.7 4.6 5.0  NEUTROABS 4.1  --   --   HGB 11.2* 11.2* 11.1*  HCT 35.4* 34.5* 34.9*  MCV 108.9* 108.2* 106.7*  PLT 64* 57* 69*   Cardiac Enzymes:  Recent Labs Lab 07/25/13 1030  TROPONINI <0.30   CBG:  Recent Labs Lab 07/24/13 1712 07/24/13 2108 07/25/13 0832 07/25/13 1204  GLUCAP 99 145* 73 106*    Recent Results (from the past 240 hour(s))  CULTURE, BLOOD (ROUTINE X 2)     Status: None   Collection Time    07/24/13  8:40 AM      Result Value Ref Range Status   Specimen Description BLOOD LEFT ARM   Final   Special Requests BOTTLES DRAWN AEROBIC AND ANAEROBIC 10CC   Final   Culture  Setup Time     Final   Value: 07/24/2013 16:43     Performed at [facility]   Culture     Final   Value:        BLOOD CULTURE RECEIVED NO GROWTH TO DATE CULTURE WILL BE HELD  FOR 5 DAYS BEFORE ISSUING A FINAL NEGATIVE REPORT     Performed at [facility]   Report Status PENDING   Incomplete  CULTURE, EXPECTORATED SPUTUM-ASSESSMENT     Status: None   Collection Time    07/24/13  3:38 PM      Result Value Ref Range Status   Specimen Description SPUTUM   Final   Special Requests NONE   Final   Sputum evaluation     Final   Value: THIS SPECIMEN IS ACCEPTABLE. RESPIRATORY CULTURE REPORT TO FOLLOW.   Report Status 07/24/2013 FINAL   Final  CULTURE, RESPIRATORY (NON-EXPECTORATED)     Status: None   Collection Time    07/24/13  3:38 PM      Result Value Ref Range Status   Specimen Description SPUTUM   Final   Special Requests NONE   Final   Gram Stain     Final   Value: ABUNDANT WBC PRESENT, PREDOMINANTLY PMN      FEW SQUAMOUS EPITHELIAL CELLS PRESENT     ABUNDANT GRAM POSITIVE RODS     FEW GRAM POSITIVE COCCI IN PAIRS     IN CHAINS FEW GRAM NEGATIVE RODS   Culture     Final   Value: Culture reincubated for better growth     Performed at [facility]   Report Status PENDING   Incomplete  CULTURE, BLOOD (ROUTINE X 2)     Status: None   Collection Time    07/24/13  5:00 PM      Result Value Ref Range Status   Specimen Description BLOOD LEFT HAND   Final   Special Requests BOTTLES DRAWN AEROBIC AND ANAEROBIC 10CC   Final   Culture  Setup Time     Final   Value: 07/24/2013 21:30     Performed at [facility]   Culture     Final   Value:        BLOOD CULTURE RECEIVED NO GROWTH TO DATE CULTURE WILL BE HELD FOR 5 DAYS BEFORE ISSUING A FINAL NEGATIVE REPORT     Performed at [facility]   Report Status PENDING   Incomplete  CLOSTRIDIUM DIFFICILE BY PCR     Status: Abnormal   Collection Time    07/24/13  6:56 PM      Result Value Ref Range Status   Sharp difficile by pcr POSITIVE (*) NEGATIVE Final   Comment: CRITICAL RESULT CALLED TO, READ BACK BY AND VERIFIED WITH:     A LEONAR,RN 1105 07/25/13 SCALES H  MRSA PCR SCREENING     Status: None   Collection Time    07/24/13  8:25 PM      Result Value Ref Range Status   MRSA by PCR NEGATIVE  NEGATIVE Final   Comment:            The GeneXpert MRSA Assay (FDA     approved for NASAL specimens     only), is one component of a     comprehensive MRSA colonization     surveillance program. It is not     intended to diagnose MRSA     infection nor to guide or     monitor treatment for     MRSA infections.     Studies: Dg Chest 2 View  07/24/2013   CLINICAL DATA:  Diarrhea for 3 days.  EXAM: CHEST  2 VIEW  COMPARISON:  None.  FINDINGS: The lungs are hyperexpanded, with flattening  of the hemidiaphragms, compatible with COPD. Bibasilar airspace opacification may reflect atelectasis or pneumonia. No pleural effusion or pneumothorax is  seen.  The heart is borderline normal in size. The patient is status post median sternotomy. There are fractures of the three superiormost sternal wires. No acute osseous abnormalities are seen.  IMPRESSION: Findings of COPD. Bibasilar airspace opacification may reflect atelectasis or pneumonia.   Electronically Signed   By: Garald Balding M.D.   On: 07/24/2013 05:44    Scheduled Meds: . aspirin EC  81 mg Oral Daily  . atorvastatin  40 mg Oral Daily  . azaTHIOprine  75 mg Oral Daily  . aztreonam  1 g Intravenous Q24H  . budesonide (PULMICORT) nebulizer solution  0.25 mg Nebulization BID  . calcium acetate  667 mg Oral TID WC  . enoxaparin (LOVENOX) injection  30 mg Subcutaneous Q24H  . feeding supplement (NEPRO CARB STEADY)  237 mL Oral BID BM  . insulin aspart  0-5 Units Subcutaneous QHS  . insulin aspart  0-9 Units Subcutaneous TID WC  . insulin glargine  20 Units Subcutaneous Daily  . ipratropium-albuterol  3 mL Nebulization QID  . metroNIDAZOLE  500 mg Oral 3 times per day  . multivitamin  1 tablet Oral QHS  . [START ON 07/26/2013] oseltamivir  30 mg Oral Q M,W,F-HD  . predniSONE  10 mg Oral Daily  . saccharomyces boulardii  250 mg Oral Daily  . tacrolimus  1.5 mg Oral BID  . vancomycin  1,500 mg Intravenous Once  . [START ON 07/26/2013] vancomycin  750 mg Intravenous Q M,W,F-HD    Time spent: >30 minutes   Brandon Sharp  [facility] Pager [phone]. If 7PM-7AM, please contact night-coverage at [email or web address], password [facility] 07/25/2013, 1:48 PM  LOS: 1 day

## 2013-07-25 NOTE — Progress Notes (Signed)
00:30 patient's HR 20's-30's, O2 92-97% on ra, complain of hard to breath, lungs diminished and rhonchi.  MD notified. Order placed for prn breathing treatment.  Will continue to monitor.

## 2013-07-25 NOTE — Consult Note (Signed)
Indication for Consultation:  Management of ESRD/hemodialysis; anemia, hypertension/volume and secondary hyperparathyroidism  HPI: WADELL CRADDOCK is a 68 y.o. male who presented to the ED Friday night for increased SOB, chest congestion and productive cough. He receives HD MWF @ [facility], history of DM, HTN, CAD, Heart transplant. He began experiencing symptoms of sob, and cough about 2 weeks ago and was given a Zpak last Monday which he completed the course but had no improvement. Symptoms were progressing and he felt chills and was nauseous so he came to the ED. Also on Monday he began to develop diarrhea(which started before antibiotic) with loss of appetite, without abdominal pain, which has improved; though he is Cdiff positive. He was admitted for workup, will receive HD tomorrow.  Past Medical History  Diagnosis Date  . Diabetes mellitus   . Hypertension   . Myocardial infarction 1985; 1990  . Angina   . Coronary artery disease   . Dysrhythmia   . CHF (congestive heart failure)   . DVT (deep venous thrombosis) ~ 12/2010    LLE  . Anemia   . Blood transfusion 06/1999    post heart transplant  . Renal failure     Hemodialysis MWF  . Peripheral vascular disease    Past Surgical History  Procedure Laterality Date  . Nephrectomy transplanted organ    . Av fistula repair      rt. a=fore arm fistula  . Av fistula placement  ~ 01/2010    "this was my 2nd fistula placement"  . Heart transplant  06/1999  . Coronary angioplasty with stent placement  1985; 1990  . Colonoscopy  03/17/2012    Procedure: COLONOSCOPY;  Surgeon: Shirley Friar, MD;  Location: [facility];  Service: Endoscopy;  Laterality: N/A;  . Coronary artery bypass graft  1990    CABG X3   Family History  Problem Relation Age of Onset  . Heart disease Mother   . Hyperlipidemia Mother   . Hypertension Mother   . Hyperlipidemia Father   . Hypertension Father   . Diabetes Brother   . Heart disease Brother    Heart Disease before age 58  . Hyperlipidemia Brother   . Hypertension Brother    Social History:  reports that he quit smoking about 25 years ago. His smoking use included Cigarettes. He has a 20 pack-year smoking history. He has never used smokeless tobacco. He reports that he drinks alcohol. He reports that he does not use illicit drugs. Allergies  Allergen Reactions  . Lisinopril Swelling    Lips and tongue swell  . Norvasc [Amlodipine Besylate] Rash    Flushing  . Penicillins Rash   Prior to Admission medications   Medication Sig Start Date End Date Taking? Authorizing Provider  aspirin EC 81 MG tablet Take 81 mg by mouth daily.     Yes Historical Provider, MD  atorvastatin (LIPITOR) 40 MG tablet Take 40 mg by mouth daily.     Yes Historical Provider, MD  azaTHIOprine (IMURAN) 50 MG tablet Take 75 mg by mouth daily.     Yes Historical Provider, MD  azithromycin (ZITHROMAX) 250 MG tablet Take 250-500 mg by mouth daily. Last dose was 07/23/13   Yes Historical Provider, MD  calcium acetate (PHOSLO) 667 MG capsule Take 667 mg by mouth 3 (three) times daily with meals.  11/01/12  Yes Historical Provider, MD  colesevelam (WELCHOL) 625 MG tablet Take 3,750 mg by mouth daily. Takes 6 tabs daily   Yes  Historical Provider, MD  HUMALOG KWIKPEN 100 UNIT/ML SOPN Inject 2-9 Units into the skin 3 (three) times daily. Per sliding scale 11/17/12  Yes Historical Provider, MD  insulin glargine (LANTUS) 100 UNIT/ML injection Inject 20 Units into the skin every morning.    Yes Historical Provider, MD  isosorbide dinitrate (ISORDIL) 40 MG tablet Take 40 mg by mouth 3 (three) times daily.     Yes Historical Provider, MD  predniSONE (DELTASONE) 10 MG tablet Take 10 mg by mouth daily.     Yes Historical Provider, MD  tacrolimus (PROGRAF) 1 MG capsule Take 1.5 mg by mouth 2 (two) times daily.    Yes Historical Provider, MD  carvedilol (COREG) 25 MG tablet Take 25-50 mg by mouth 2 (two) times daily with a meal.  Takes 50mg  on Tue thurs sat and sun Takes 25 mg on mon wed and fri 04/19/11 04/18/12  Henderson CloudEstela Y Hernandez Acosta, MD  carvedilol (COREG) 25 MG tablet Take 25 mg by mouth 2 (two) times daily with a meal. 50 mg on Tues Thurs Sat and Sun.  Take 25 mg on Mon Wed and Fri.    Historical Provider, MD   Current Facility-Administered Medications  Medication Dose Route Frequency Provider Last Rate Last Dose  . acetaminophen (TYLENOL) tablet 650 mg  650 mg Oral Q6H PRN Vassie Lollarlos Madera, MD   650 mg at 07/25/13 1034  . albuterol (PROVENTIL) (2.5 MG/3ML) 0.083% nebulizer solution 2.5 mg  2.5 mg Nebulization Q4H PRN Rolan Lipahomas Michael Callahan, NP   2.5 mg at 07/25/13 0054  . aspirin EC tablet 81 mg  81 mg Oral Daily Ripudeep Jenna LuoK Rai, MD   81 mg at 07/25/13 1035  . atorvastatin (LIPITOR) tablet 40 mg  40 mg Oral Daily Ripudeep Jenna LuoK Rai, MD   40 mg at 07/25/13 1035  . azaTHIOprine (IMURAN) tablet 75 mg  75 mg Oral Daily Ripudeep Jenna LuoK Rai, MD   75 mg at 07/25/13 1039  . aztreonam (AZACTAM) 1 g in dextrose 5 % 50 mL IVPB  1 g Intravenous Q24H Anabel BeneSendra Yang, RPH   1 g at 07/24/13 1608  . budesonide (PULMICORT) nebulizer solution 0.25 mg  0.25 mg Nebulization BID Vassie Lollarlos Madera, MD   0.25 mg at 07/25/13 1020  . calcium acetate (PHOSLO) capsule 667 mg  667 mg Oral TID WC Ripudeep Jenna LuoK Rai, MD   667 mg at 07/25/13 0842  . enoxaparin (LOVENOX) injection 30 mg  30 mg Subcutaneous Q24H Ripudeep K Rai, MD   30 mg at 07/24/13 1620  . guaiFENesin-codeine 100-10 MG/5ML solution 5 mL  5 mL Oral Q6H PRN Ripudeep Jenna LuoK Rai, MD   5 mL at 07/24/13 2123  . insulin aspart (novoLOG) injection 0-5 Units  0-5 Units Subcutaneous QHS Ripudeep K Rai, MD      . insulin aspart (novoLOG) injection 0-9 Units  0-9 Units Subcutaneous TID WC Ripudeep K Rai, MD      . insulin glargine (LANTUS) injection 20 Units  20 Units Subcutaneous Daily Ripudeep Jenna LuoK Rai, MD   20 Units at 07/25/13 1042  . ipratropium-albuterol (DUONEB) 0.5-2.5 (3) MG/3ML nebulizer solution 3 mL  3 mL  Nebulization QID Ripudeep K Rai, MD   3 mL at 07/25/13 1020  . [START ON 07/26/2013] oseltamivir (TAMIFLU) capsule 30 mg  30 mg Oral Q M,W,F-HD Vassie Lollarlos Madera, MD      . predniSONE (DELTASONE) tablet 10 mg  10 mg Oral Daily Ripudeep Jenna LuoK Rai, MD   10 mg at 07/25/13 1037  .  saccharomyces boulardii (FLORASTOR) capsule 250 mg  250 mg Oral Daily Ripudeep K Rai, MD   250 mg at 07/25/13 1038  . tacrolimus (PROGRAF) capsule 1.5 mg  1.5 mg Oral BID Ripudeep K Rai, MD   1.5 mg at 07/25/13 1035  . vancomycin (VANCOCIN) 1,500 mg in sodium chloride 0.9 % 500 mL IVPB  1,500 mg Intravenous Once Jeronimo Norma, [facility]      . [START ON 07/26/2013] vancomycin (VANCOCIN) IVPB 750 mg/150 ml premix  750 mg Intravenous Q M,W,F-HD Jeronimo Norma, [facility]       Labs: Basic Metabolic Panel:  Recent Labs Lab 07/24/13 0830 07/24/13 1700 07/25/13 0636  NA 145  --  140  K 3.7  --  4.4  CL 98  --  96  CO2 34*  --  27  GLUCOSE 102*  --  73  BUN 18  --  29*  CREATININE 4.64* 5.32* 6.41*  CALCIUM 9.0  --  8.6   Liver Function Tests: No results found for this basename: AST, ALT, ALKPHOS, BILITOT, PROT, ALBUMIN,  in the last 168 hours No results found for this basename: LIPASE, AMYLASE,  in the last 168 hours No results found for this basename: AMMONIA,  in the last 168 hours CBC:  Recent Labs Lab 07/24/13 0830 07/24/13 1700 07/25/13 0636  WBC 5.7 4.6 5.0  NEUTROABS 4.1  --   --   HGB 11.2* 11.2* 11.1*  HCT 35.4* 34.5* 34.9*  MCV 108.9* 108.2* 106.7*  PLT 64* 57* 69*   Cardiac Enzymes: No results found for this basename: CKTOTAL, CKMB, CKMBINDEX, TROPONINI,  in the last 168 hours CBG:  Recent Labs Lab 07/24/13 1712 07/24/13 2108 07/25/13 0832  GLUCAP 99 145* 73   Iron Studies: No results found for this basename: IRON, TIBC, TRANSFERRIN, FERRITIN,  in the last 72 hours Studies/Results: Dg Chest 2 View  07/24/2013   CLINICAL DATA:  Diarrhea for 3 days.  EXAM: CHEST  2 VIEW  COMPARISON:  None.  FINDINGS: The lungs  are hyperexpanded, with flattening of the hemidiaphragms, compatible with COPD. Bibasilar airspace opacification may reflect atelectasis or pneumonia. No pleural effusion or pneumothorax is seen.  The heart is borderline normal in size. The patient is status post median sternotomy. There are fractures of the three superiormost sternal wires. No acute osseous abnormalities are seen.  IMPRESSION: Findings of COPD. Bibasilar airspace opacification may reflect atelectasis or pneumonia.   Electronically Signed   By: Garald Balding M.D.   On: 07/24/2013 05:44    Review of Systems: Gen: Feeling better now. Denies any fever, had chills but not anymore.  denies sweats, anorexia, fatigue, Reports some weakness, appetite improving, thinks he lost some weight HEENT: No visual complaints, No history of Retinopathy. Normal external appearance No Epistaxis or Sore throat. No sinusitis.   CV: Denies chest pain, angina, palpitations, syncope, orthopnea, PND, peripheral edema, and claudication. Resp: Reports dyspnea at rest, dyspnea with exercise, cough, sputum- symptoms have improved since admission. Denies wheezing, coughing up blood, and pleurisy. GI: Reports diarrhea- x1 today, denies abd pain. Denies vomiting blood, jaundice, and fecal incontinence.   Denies dysphagia or odynophagia. GU : Denies urinary burning, blood in urine, urinary frequency, urinary hesitancy, nocturnal urination, and urinary incontinence.  No renal calculi. MS: Denies joint pain, limitation of movement, and swelling, stiffness, low back pain, extremity pain. Denies muscle weakness, cramps, atrophy.  No use of non steroidal antiinflammatory drugs. Derm: Denies rash, itching, dry skin, hives, moles, warts, or  unhealing ulcers.  Psych: Denies depression, anxiety, memory loss, suicidal ideation, hallucinations, paranoia, and confusion. Heme: Denies bruising, bleeding, and enlarged lymph nodes. Neuro: No headache.  No diplopia. No dysarthria.  No  dysphasia.  No history of CVA.  No Seizures. No paresthesias.  No weakness. Endocrine  No Thyroid disease.  No Adrenal disease.  Physical Exam: Filed Vitals:   07/25/13 0019 07/25/13 0054 07/25/13 0425 07/25/13 1022  BP: 151/85  115/54 142/50  Pulse: 69  60 69  Temp: 97.8 F (36.6 C)  98 F (36.7 C) 97.4 F (36.3 C)  TempSrc: Tympanic  Oral Oral  Resp: 28  20 19   Height:      Weight:      SpO2: 93% 96% 93% 95%     General: Well developed, well nourished, in no acute distress. Head: Normocephalic, atraumatic, sclera non-icteric, mucus membranes are moist Neck: Supple. JVD not elevated. Lungs: Dim bases with rhonchi. Breathing is unlabored. Heart: RRR with S1 S2. No murmurs, rubs, or gallops appreciated. Abdomen: Soft, non-tender, non-distended with normoactive bowel sounds. No rebound/guarding. No obvious abdominal masses. M-S:  Strength and tone appear normal for age. Lower extremities:without edema or ischemic changes, no open wounds  Neuro: Alert and oriented X 3. Moves all extremities spontaneously. Psych:  Responds to questions appropriately with a normal affect. Dialysis Access: R AVF   Dialysis Orders: MWF @ [facility] 4 hr  74kg   2K/2.25Ca 3000Heparin 400/1.5  R AVF hectorol 17mcg IV/HD Epogen 3600 Units IV/HD  Venofer  50/wk    Assessment/Plan:  1.   SOB- work up per primary- was given zpak 2/23- failed treatment. chest xray- PNA; vanc per pharm.  Influenza A+ tamilfu. Echo results pending. WBC 5. Afebrile. Blood cultures pending, symptomatic treatment 2. Diarrhea- Cdiff +, per primary 3. ESRD -   MWF @[facility]  K+4.4. HD pending  for tomorrow. EDW is 74, current wt 72, will need dec at DC 4.  Hypertension/volume  - 142/50, watch BP restart coreg as needed 5.  Anemia  - hgb 11.1 watch CBC hold ESA and iron (last t sat 49) 6.  Metabolic bone disease -  Ca+ 8.6, cont hectorol. Phos 5.4 phoslo with meals. PTH 149 7.  Nutrition - Alb 3.2 renal diet, multivit, will add nepro 8. DM-  per primary 9. History of heart transplant in 2001- on imuran and prograf  Shelle Iron, NP [facility] [phone] 07/25/2013, 11:43 AM   Pt seen, examined and agree w A/P as above.  Kelly Splinter MD pager [phone]    cell [phone] 07/25/2013, 6:10 PM

## 2013-07-25 NOTE — Evaluation (Signed)
Physical Therapy Evaluation Patient Details Name: Brandon Sharp MRN: [id number] DOB: [birth year] Today's Date: 07/25/2013 Time: 0350-0938 PT Time Calculation (min): 13 min  PT Assessment / Plan / Recommendation History of Present Illness  Patient is a 68 year old male with ESRD on HD MWF, diabetes has been having increasing cough and congestion over the last 2 weeks. Admitted with HCAP, diarrhea, dehydration, and bradycardia.  Clinical Impression  Patient presents with problems listed below.  Will benefit from acute PT to maximize independence prior to discharge home with wife.  Patient with decreased balance in gait due to weakness - patient declining HHPT.  Recommend patient use RW for safety/balance.  Will need RW for home.    PT Assessment  Patient needs continued PT services    Follow Up Recommendations  Home health PT;Supervision/Assistance - 24 hour (Patient declining HHPT)    Does the patient have the potential to tolerate intense rehabilitation      Barriers to Discharge        Equipment Recommendations  Rolling walker with 5" wheels    Recommendations for Other Services     Frequency Min 3X/week    Precautions / Restrictions Precautions Precautions: Fall Restrictions Weight Bearing Restrictions: No   Pertinent Vitals/Pain HR at 65 prior to mobility, and increased to 82 during ambulation.  No bradycardia noted during session.      Mobility  Bed Mobility Overal bed mobility: Modified Independent General bed mobility comments: Patient able to move to sitting with use of bedrail.  Once upright, patient with good sitting balance. Transfers Overall transfer level: Needs assistance Equipment used: Straight cane Transfers: Sit to/from Stand Sit to Stand: Min guard General transfer comment: Assist for safety.  Stance slightly unsteady.  Patient with flexed posture with forward head. Ambulation/Gait Ambulation/Gait assistance: Min assist Ambulation Distance (Feet):  24 Feet Assistive device: Straight cane Gait Pattern/deviations: Step-through pattern;Decreased step length - right;Decreased step length - left;Decreased stride length;Shuffle;Trunk flexed Gait velocity: Slow gait speed Gait velocity interpretation: Below normal speed for age/gender General Gait Details: Patient demonstrates safe use of cane.  Balance decreased during gait - unsteady.  Patient with flexed posture with forward head, looking down at floor.  Verbal cues to stand upright and look up during gait.    Exercises     PT Diagnosis: Abnormality of gait;Difficulty walking;Generalized weakness  PT Problem List: Decreased strength;Decreased activity tolerance;Decreased balance;Decreased mobility;Cardiopulmonary status limiting activity PT Treatment Interventions: DME instruction;Gait training;Stair training;Functional mobility training;Balance training;Patient/family education     PT Goals(Current goals can be found in the care plan section) Acute Rehab PT Goals Patient Stated Goal: To go home PT Goal Formulation: With patient Time For Goal Achievement: 08/01/13 Potential to Achieve Goals: Good  Visit Information  Last PT Received On: 07/25/13 Assistance Needed: +1 History of Present Illness: Patient is a 68 year old male with ESRD on HD MWF, diabetes has been having increasing cough and congestion over the last 2 weeks. Admitted with HCAP, diarrhea, dehydration, and bradycardia.       Prior [place] expects to be discharged to:: Private residence Living Arrangements: Spouse/significant other Available Help at Discharge: Family;Available 24 hours/day Type of Home: House Home Access: Stairs to enter [employer] of Steps: 3 Entrance Stairs-Rails: None Home Layout: One level Home Equipment: Cane - single point;Shower seat Prior Function Level of Independence: Independent with assistive device(s) Comments: Patient drives himself to  HD. Communication Communication: No difficulties    Cognition  Cognition Arousal/Alertness: Awake/alert Behavior During Therapy:  WFL for tasks assessed/performed Overall Cognitive Status: Within Functional Limits for tasks assessed    Extremity/Trunk Assessment Upper Extremity Assessment Upper Extremity Assessment: Generalized weakness Lower Extremity Assessment Lower Extremity Assessment: Generalized weakness Cervical / Trunk Assessment Cervical / Trunk Assessment: Kyphotic   Balance Balance Overall balance assessment: Needs assistance Sitting-balance support: No upper extremity supported;Feet supported Sitting balance-Leahy Scale: Good Standing balance support: Single extremity supported Standing balance-Leahy Scale: Fair High level balance activites: Head turns High Level Balance Comments: Decreased balance when turning head to talk with PT.  End of Session PT - End of Session Equipment Utilized During Treatment: Gait belt Activity Tolerance: Patient limited by fatigue Patient left: in bed;with call bell/phone within reach Nurse Communication: Mobility status  GP     Despina Pole 07/25/2013, 12:57 PM Carita Pian. Sanjuana Kava, [place] Pager [phone]

## 2013-07-26 DIAGNOSIS — Z941 Heart transplant status: Secondary | ICD-10-CM

## 2013-07-26 DIAGNOSIS — E785 Hyperlipidemia, unspecified: Secondary | ICD-10-CM

## 2013-07-26 LAB — CBC
HCT: 31.6 % — ABNORMAL LOW (ref 39.0–52.0)
Hemoglobin: 10.4 g/dL — ABNORMAL LOW (ref 13.0–17.0)
MCH: 34.1 pg — ABNORMAL HIGH (ref 26.0–34.0)
MCHC: 32.9 g/dL (ref 30.0–36.0)
MCV: 103.6 fL — ABNORMAL HIGH (ref 78.0–100.0)
PLATELETS: 92 10*3/uL — AB (ref 150–400)
RBC: 3.05 MIL/uL — ABNORMAL LOW (ref 4.22–5.81)
RDW: 14.8 % (ref 11.5–15.5)
WBC: 4.5 10*3/uL (ref 4.0–10.5)

## 2013-07-26 LAB — RENAL FUNCTION PANEL
ALBUMIN: 2.3 g/dL — AB (ref 3.5–5.2)
BUN: 50 mg/dL — AB (ref 6–23)
CHLORIDE: 94 meq/L — AB (ref 96–112)
CO2: 25 mEq/L (ref 19–32)
CREATININE: 8.22 mg/dL — AB (ref 0.50–1.35)
Calcium: 8.8 mg/dL (ref 8.4–10.5)
GFR calc Af Amer: 7 mL/min — ABNORMAL LOW (ref >90)
GFR, EST NON AFRICAN AMERICAN: 6 mL/min — AB (ref >90)
Glucose, Bld: 69 mg/dL — ABNORMAL LOW (ref 70–99)
POTASSIUM: 3.7 meq/L (ref 3.7–5.3)
Phosphorus: 4.6 mg/dL (ref 2.3–4.6)
Sodium: 136 mEq/L — ABNORMAL LOW (ref 137–147)

## 2013-07-26 LAB — HEPATITIS B SURFACE ANTIGEN: HEP B S AG: NEGATIVE

## 2013-07-26 LAB — CULTURE, RESPIRATORY W GRAM STAIN: Culture: NORMAL

## 2013-07-26 LAB — CULTURE, RESPIRATORY

## 2013-07-26 LAB — GLUCOSE, CAPILLARY
GLUCOSE-CAPILLARY: 186 mg/dL — AB (ref 70–99)
Glucose-Capillary: 73 mg/dL (ref 70–99)
Glucose-Capillary: 147 mg/dL — ABNORMAL HIGH (ref 70–99)

## 2013-07-26 LAB — TSH: TSH: 2.189 u[IU]/mL (ref 0.350–4.500)

## 2013-07-26 MED ORDER — HEPARIN SODIUM (PORCINE) 1000 UNIT/ML DIALYSIS
1000.0000 [IU] | INTRAMUSCULAR | Status: DC | PRN
  Filled 2013-07-26: qty 1

## 2013-07-26 MED ORDER — NEPRO/CARBSTEADY PO LIQD: 237.0000 mL | ORAL | Status: DC | PRN

## 2013-07-26 MED ORDER — DOXERCALCIFEROL 4 MCG/2ML IV SOLN
2.0000 ug | INTRAVENOUS | Status: DC
  Administered 2013-07-26: 2 ug via INTRAVENOUS
  Filled 2013-07-26 (×2): qty 2

## 2013-07-26 MED ORDER — LIDOCAINE HCL (PF) 1 % IJ SOLN: 5.0000 mL | INTRAMUSCULAR | Status: DC | PRN

## 2013-07-26 MED ORDER — SODIUM CHLORIDE 0.9 % IV SOLN: 100.0000 mL | INTRAVENOUS | Status: DC | PRN

## 2013-07-26 MED ORDER — HEPARIN SODIUM (PORCINE) 1000 UNIT/ML DIALYSIS
3000.0000 [IU] | Freq: Once | INTRAMUSCULAR | Status: AC
  Administered 2013-07-26: 3000 [IU] via INTRAVENOUS_CENTRAL
  Filled 2013-07-26: qty 3

## 2013-07-26 MED ORDER — DOXERCALCIFEROL 4 MCG/2ML IV SOLN
INTRAVENOUS | Status: AC
  Administered 2013-07-26: 2 ug via INTRAVENOUS
  Filled 2013-07-26: qty 2

## 2013-07-26 MED ORDER — PENTAFLUOROPROP-TETRAFLUOROETH EX AERO: 1.0000 "application " | INHALATION_SPRAY | CUTANEOUS | Status: DC | PRN

## 2013-07-26 MED ORDER — ALTEPLASE 2 MG IJ SOLR
2.0000 mg | Freq: Once | INTRAMUSCULAR | Status: AC | PRN
  Filled 2013-07-26: qty 2

## 2013-07-26 MED ORDER — LIDOCAINE-PRILOCAINE 2.5-2.5 % EX CREA: 1.0000 "application " | TOPICAL_CREAM | CUTANEOUS | Status: DC | PRN

## 2013-07-26 NOTE — Progress Notes (Signed)
Physical Therapy Treatment Patient Details Name: Brandon Sharp MRN: [id number] DOB: 09/10/1945 Today's Date: 07/26/2013 Time: 7628-3151 PT Time Calculation (min): 26 min  PT Assessment / Plan / Recommendation  History of Present Illness Patient is a 68 year old male with ESRD on HD MWF, diabetes has been having increasing cough and congestion over the last 2 weeks. Admitted with HCAP, diarrhea, dehydration, and bradycardia.   PT Comments   Patient progressing with endurance with ambulation and able to maneuver with walker with cues.  Feel he will need walker due to weakness for safety and decreased fall risk.  Needs further education on fall risk reduction and walker safety.  Follow Up Recommendations  Home health PT;Supervision/Assistance - 24 hour           Equipment Recommendations  Rolling walker with 5" wheels    Recommendations for Other Services  OT  Frequency Min 3X/week   Progress towards PT Goals Progress towards PT goals: Progressing toward goals  Plan Current plan remains appropriate    Precautions / Restrictions Precautions Precautions: Fall Restrictions Weight Bearing Restrictions: No   Pertinent Vitals/Pain Denies pain; HR 99 with ambulation    Mobility  Bed Mobility Overal bed mobility: Modified Independent Transfers Overall transfer level: Needs assistance Equipment used: Rolling walker (2 wheeled);None Transfers: Sit to/from Stand Sit to Stand: Min assist General transfer comment: performed to walk and then x 5 for LE strength with legs braced on chair and increased assist due posterior bias Ambulation/Gait Ambulation/Gait assistance: Min guard Ambulation Distance (Feet): 100 Feet (and 80') Assistive device: Rolling walker (2 wheeled) Gait Pattern/deviations: Step-through pattern;Trunk flexed;Wide base of support;Decreased stride length General Gait Details: heavy reliance on walker and needs cues for safety negotiating with walker in room     Exercises General Exercises - Lower Extremity Long Arc Quad: AROM;Both;10 reps;Seated Hip Flexion/Marching: AROM;Both;10 reps;Seated Toe Raises: AROM;Both;10 reps;Seated Heel Raises: Both;10 reps;Seated     PT Goals (current goals can now be found in the care plan section)    Visit Information  Last PT Received On: 07/26/13 Assistance Needed: +1 History of Present Illness: Patient is a 68 year old male with ESRD on HD MWF, diabetes has been having increasing cough and congestion over the last 2 weeks. Admitted with HCAP, diarrhea, dehydration, and bradycardia.    Subjective Data      Cognition  Cognition Arousal/Alertness: Awake/alert Behavior During Therapy: WFL for tasks assessed/performed Overall Cognitive Status: Within Functional Limits for tasks assessed    Balance  Balance Standing balance-Leahy Scale: Poor High Level Balance Comments: heavy UE reliance  End of Session PT - End of Session Equipment Utilized During Treatment: Gait belt Activity Tolerance: Patient tolerated treatment well Patient left: in chair;with call bell/phone within reach   GP     [facility] 07/26/2013, 2:32 PM [place], [place] 07/26/2013

## 2013-07-26 NOTE — Procedures (Signed)
Patient was seen on dialysis and the procedure was supervised.  BFR 400  Via AVF BP is  132/66.   Patient appears to be tolerating treatment well  Prescilla Monger A 07/26/2013

## 2013-07-26 NOTE — Progress Notes (Signed)
[facility] PROGRESS NOTE  Brandon Sharp:[id number] DOB: February 18, 1946 DOA: 07/24/2013 PCP: [name], MD  Assessment/Plan: 1-SOB/cough/fever: secondary to HCAP and influenza -will continue broad spectrum antibiotics at least X 1-2 more days -continue tamiflu and pulmicort -continue supportive care -PRN antitussives and antipyretics  -repeat CXR in am  2-C. Diff enteritis: -continue on flagyl -patient on contact precautions -no significant diarrhea according to patient  3-DM type 2: -last A1C 6.4 (2/28) -continue SSI and lantus  4-ESRD: renal service contacted to continue HD while inpatient. -will follow rec's  5-Generalized weakness and deconditioning: due to #1. -PT has evaluated patient and is recommending HHPT at discharge. Will follow progress and response; patient currently declining any services.  6-HLD: continue statin  7-CAD and bradycardia: status post PCI 2 months ago. -will follow 2-d echo, troponin negative and TSH WNL  -will continue holding coreg at this moment -HR stable but borderline low  (initially experiencing bradycardia)  8-Hx of heart transplant: -continue prograf and tacrolimus   DVT: lovenox  Code Status: Full Family Communication: no family at bedside  Disposition Plan: home when medically stable (currently declining HHPT)   Consultants:  Renal service  Procedures:  See below for x-ray reports  Antibiotics:  Aztreonam 2/28  Vancomycin 2/28  Flagyl 3/01  tamiflu 3/01  HPI/Subjective: Feeling a lot better. Denies CP or significant SOB. Reports diarrhea is significant better (just 1 episode a day)  Objective: Filed Vitals:   07/26/13 1649  BP: 102/52  Pulse: 61  Temp: 98.7 F (37.1 C)  Resp: 20    Intake/Output Summary (Last 24 hours) at 07/26/13 1930 Last data filed at 07/26/13 1800  Gross per 24 hour  Intake    680 ml  Output   1301 ml  Net   -621 ml   Filed Weights   07/24/13 1500 07/25/13  2100 07/26/13 0810  Weight: 72.14 kg (159 lb 0.6 oz) 74.3 kg (163 lb 12.8 oz) 73.2 kg (161 lb 6 oz)    Exam:   General:  No fever in last 20 hours, feeling better; diarrhea significantly better.  Cardiovascular:S1 and S2, no rubs or gallops  Respiratory: improved air movement, scattered rhonchi, no exp wheezing   Abdomen: soft, ND, positive BS  Musculoskeletal: no joint swelling or erythema  Data Reviewed: Basic Metabolic Panel:  Recent Labs Lab 07/24/13 0830 07/24/13 1700 07/25/13 0636 07/26/13 0827  NA 145  --  140 136*  K 3.7  --  4.4 3.7  CL 98  --  96 94*  CO2 34*  --  27 25  GLUCOSE 102*  --  73 69*  BUN 18  --  29* 50*  CREATININE 4.64* 5.32* 6.41* 8.22*  CALCIUM 9.0  --  8.6 8.8  PHOS  --   --   --  4.6   CBC:  Recent Labs Lab 07/24/13 0830 07/24/13 1700 07/25/13 0636 07/26/13 0827  WBC 5.7 4.6 5.0 4.5  NEUTROABS 4.1  --   --   --   HGB 11.2* 11.2* 11.1* 10.4*  HCT 35.4* 34.5* 34.9* 31.6*  MCV 108.9* 108.2* 106.7* 103.6*  PLT 64* 57* 69* 92*   Cardiac Enzymes:  Recent Labs Lab 07/25/13 1030 07/25/13 1350 07/25/13 2005  TROPONINI <0.30 <0.30 <0.30   CBG:  Recent Labs Lab 07/25/13 1204 07/25/13 1649 07/25/13 2124 07/26/13 1236 07/26/13 1638  GLUCAP 106* 145* 150* 73 186*    Recent Results (from the past 240 hour(s))  CULTURE, BLOOD (ROUTINE X 2)  Status: None   Collection Time    07/24/13  8:40 AM      Result Value Ref Range Status   Specimen Description BLOOD LEFT ARM   Final   Special Requests BOTTLES DRAWN AEROBIC AND ANAEROBIC 10CC   Final   Culture  Setup Time     Final   Value: 07/24/2013 16:43     Performed at [facility]   Culture     Final   Value:        BLOOD CULTURE RECEIVED NO GROWTH TO DATE CULTURE WILL BE HELD FOR 5 DAYS BEFORE ISSUING A FINAL NEGATIVE REPORT     Performed at [facility]   Report Status PENDING   Incomplete  CULTURE, EXPECTORATED SPUTUM-ASSESSMENT     Status: None    Collection Time    07/24/13  3:38 PM      Result Value Ref Range Status   Specimen Description SPUTUM   Final   Special Requests NONE   Final   Sputum evaluation     Final   Value: THIS SPECIMEN IS ACCEPTABLE. RESPIRATORY CULTURE REPORT TO FOLLOW.   Report Status 07/24/2013 FINAL   Final  CULTURE, RESPIRATORY (NON-EXPECTORATED)     Status: None   Collection Time    07/24/13  3:38 PM      Result Value Ref Range Status   Specimen Description SPUTUM   Final   Special Requests NONE   Final   Gram Stain     Final   Value: ABUNDANT WBC PRESENT, PREDOMINANTLY PMN     FEW SQUAMOUS EPITHELIAL CELLS PRESENT     ABUNDANT GRAM POSITIVE RODS     FEW GRAM POSITIVE COCCI IN PAIRS     IN CHAINS FEW GRAM NEGATIVE RODS   Culture     Final   Value: NORMAL OROPHARYNGEAL FLORA     Performed at [facility]   Report Status 07/26/2013 FINAL   Final  CULTURE, BLOOD (ROUTINE X 2)     Status: None   Collection Time    07/24/13  5:00 PM      Result Value Ref Range Status   Specimen Description BLOOD LEFT HAND   Final   Special Requests BOTTLES DRAWN AEROBIC AND ANAEROBIC 10CC   Final   Culture  Setup Time     Final   Value: 07/24/2013 21:30     Performed at [facility]   Culture     Final   Value:        BLOOD CULTURE RECEIVED NO GROWTH TO DATE CULTURE WILL BE HELD FOR 5 DAYS BEFORE ISSUING A FINAL NEGATIVE REPORT     Performed at [facility]   Report Status PENDING   Incomplete  CLOSTRIDIUM DIFFICILE BY PCR     Status: Abnormal   Collection Time    07/24/13  6:56 PM      Result Value Ref Range Status   C difficile by pcr POSITIVE (*) NEGATIVE Final   Comment: CRITICAL RESULT CALLED TO, READ BACK BY AND VERIFIED WITH:     A LEONAR,RN 1105 07/25/13 SCALES H  MRSA PCR SCREENING     Status: None   Collection Time    07/24/13  8:25 PM      Result Value Ref Range Status   MRSA by PCR NEGATIVE  NEGATIVE Final   Comment:            The GeneXpert MRSA Assay (FDA  approved  for NASAL specimens     only), is one component of a     comprehensive MRSA colonization     surveillance program. It is not     intended to diagnose MRSA     infection nor to guide or     monitor treatment for     MRSA infections.     Studies: No results found.  Scheduled Meds: . aspirin EC  81 mg Oral Daily  . atorvastatin  40 mg Oral Daily  . azaTHIOprine  75 mg Oral Daily  . aztreonam  1 g Intravenous Q24H  . budesonide (PULMICORT) nebulizer solution  0.25 mg Nebulization BID  . calcium acetate  667 mg Oral TID WC  . doxercalciferol  2 mcg Intravenous Q M,W,F-HD  . enoxaparin (LOVENOX) injection  30 mg Subcutaneous Q24H  . feeding supplement (NEPRO CARB STEADY)  237 mL Oral BID BM  . insulin aspart  0-5 Units Subcutaneous QHS  . insulin aspart  0-9 Units Subcutaneous TID WC  . insulin glargine  20 Units Subcutaneous Daily  . ipratropium-albuterol  3 mL Nebulization QID  . metroNIDAZOLE  500 mg Oral 3 times per day  . multivitamin  1 tablet Oral QHS  . oseltamivir  30 mg Oral Q M,W,F-HD  . predniSONE  10 mg Oral Daily  . saccharomyces boulardii  250 mg Oral Daily  . tacrolimus  1.5 mg Oral BID  . vancomycin  750 mg Intravenous Q M,W,F-HD    Time spent: >30 minutes   Duaine Radin  [facility] Pager [phone]. If 7PM-7AM, please contact night-coverage at [email or web address], password [facility] 07/26/2013, 7:30 PM  LOS: 2 days

## 2013-07-26 NOTE — Progress Notes (Signed)
Subjective:  Seen on dialysis actually looks good- says he feels much better Objective Vital signs in last 24 hours: Filed Vitals:   07/26/13 0554 07/26/13 0810 07/26/13 0815 07/26/13 0852  BP: 109/57 132/66 122/61 132/66  Pulse: 71 62 67 67  Temp: 98.2 F (36.8 C) 98.3 F (36.8 C)    TempSrc: Oral Oral    Resp: 18 19    Height:      Weight:  73.2 kg (161 lb 6 oz)    SpO2: 98% 96%     Weight change: 2.16 kg (4 lb 12.2 oz)  Intake/Output Summary (Last 24 hours) at 07/26/13 0910 Last data filed at 07/25/13 1725  Gross per 24 hour  Intake    960 ml  Output      0 ml  Net    960 ml   Dialysis Orders: MWF @ [facility]  4 hr 74kg 2K/2.25Ca 3000Heparin 400/1.5 R AVF  hectorol 90mcg IV/HD Epogen 3600 Units IV/HD Venofer 50/wk     Assessment/ Plan: Pt is a 68 y.o. yo male who was admitted on 07/24/2013 with  Fever/SOB/cough  Assessment/Plan: 1. Fever/cough/PNA/flu +- much improved. On azactam /tamiflu/vanc and nebs- seems much improved, just a little cough- no O2 req 2. ESRD - normally MWF at [facility] via AVF- have kept on schedule 3. Anemia- hgb over 11, no ESA 4. Secondary hyperparathyroidism- on phoslo/hect 5. HTN/volume- seems fine, no BP meds 6. C diff- on flagyl- diarrhea controlled 7. S/p heart tx- on imuran/prograf and pred 8. Dispo- looking good, per primary team- possibly change to oral meds to look to discharge in 1-2 days    Teliah Buffalo A    Labs: Basic Metabolic Panel:  Recent Labs Lab 07/24/13 0830 07/24/13 1700 07/25/13 0636  NA 145  --  140  K 3.7  --  4.4  CL 98  --  96  CO2 34*  --  27  GLUCOSE 102*  --  73  BUN 18  --  29*  CREATININE 4.64* 5.32* 6.41*  CALCIUM 9.0  --  8.6   Liver Function Tests: No results found for this basename: AST, ALT, ALKPHOS, BILITOT, PROT, ALBUMIN,  in the last 168 hours No results found for this basename: LIPASE, AMYLASE,  in the last 168 hours No results found for this basename: AMMONIA,  in the last 168  hours CBC:  Recent Labs Lab 07/24/13 0830 07/24/13 1700 07/25/13 0636  WBC 5.7 4.6 5.0  NEUTROABS 4.1  --   --   HGB 11.2* 11.2* 11.1*  HCT 35.4* 34.5* 34.9*  MCV 108.9* 108.2* 106.7*  PLT 64* 57* 69*   Cardiac Enzymes:  Recent Labs Lab 07/25/13 1030 07/25/13 1350 07/25/13 2005  TROPONINI <0.30 <0.30 <0.30   CBG:  Recent Labs Lab 07/24/13 2108 07/25/13 0832 07/25/13 1204 07/25/13 1649 07/25/13 2124  GLUCAP 145* 73 106* 145* 150*    Iron Studies: No results found for this basename: IRON, TIBC, TRANSFERRIN, FERRITIN,  in the last 72 hours Studies/Results: No results found. Medications: Infusions:    Scheduled Medications: . aspirin EC  81 mg Oral Daily  . atorvastatin  40 mg Oral Daily  . azaTHIOprine  75 mg Oral Daily  . aztreonam  1 g Intravenous Q24H  . budesonide (PULMICORT) nebulizer solution  0.25 mg Nebulization BID  . calcium acetate  667 mg Oral TID WC  . doxercalciferol  2 mcg Intravenous Q M,W,F-HD  . enoxaparin (LOVENOX) injection  30 mg Subcutaneous Q24H  .  feeding supplement (NEPRO CARB STEADY)  237 mL Oral BID BM  . heparin  3,000 Units Dialysis Once in dialysis  . insulin aspart  0-5 Units Subcutaneous QHS  . insulin aspart  0-9 Units Subcutaneous TID WC  . insulin glargine  20 Units Subcutaneous Daily  . ipratropium-albuterol  3 mL Nebulization QID  . metroNIDAZOLE  500 mg Oral 3 times per day  . multivitamin  1 tablet Oral QHS  . oseltamivir  30 mg Oral Q M,W,F-HD  . predniSONE  10 mg Oral Daily  . saccharomyces boulardii  250 mg Oral Daily  . tacrolimus  1.5 mg Oral BID  . vancomycin  750 mg Intravenous Q M,W,F-HD    have reviewed scheduled and prn medications.  Physical Exam: General: looks good Heart: RRR Lungs: clear Abdomen: soft, non tender Extremities: no edema Dialysis Access:  R AVF accessed     07/26/2013,9:10 AM  LOS: 2 days

## 2013-07-27 ENCOUNTER — Encounter (HOSPITAL_COMMUNITY): Payer: Self-pay

## 2013-07-27 ENCOUNTER — Ambulatory Visit (HOSPITAL_COMMUNITY): Admit: 2013-07-27 | Payer: Medicare Other | Admitting: Gastroenterology

## 2013-07-27 DIAGNOSIS — R5381 Other malaise: Secondary | ICD-10-CM

## 2013-07-27 DIAGNOSIS — I359 Nonrheumatic aortic valve disorder, unspecified: Secondary | ICD-10-CM

## 2013-07-27 DIAGNOSIS — R5383 Other fatigue: Secondary | ICD-10-CM

## 2013-07-27 LAB — GLUCOSE, CAPILLARY
Glucose-Capillary: 66 mg/dL — ABNORMAL LOW (ref 70–99)
Glucose-Capillary: 70 mg/dL (ref 70–99)
Glucose-Capillary: 121 mg/dL — ABNORMAL HIGH (ref 70–99)
Glucose-Capillary: 131 mg/dL — ABNORMAL HIGH (ref 70–99)
Glucose-Capillary: 103 mg/dL — ABNORMAL HIGH (ref 70–99)

## 2013-07-27 SURGERY — COLONOSCOPY
Anesthesia: Moderate Sedation

## 2013-07-27 MED ORDER — CARVEDILOL 3.125 MG PO TABS
3.1250 mg | ORAL_TABLET | Freq: Two times a day (BID) | ORAL | Status: DC
  Administered 2013-07-27 – 2013-07-29 (×4): 3.125 mg via ORAL
  Filled 2013-07-27 (×6): qty 1

## 2013-07-27 MED ORDER — GUAIFENESIN ER 600 MG PO TB12
600.0000 mg | ORAL_TABLET | Freq: Two times a day (BID) | ORAL | Status: DC
  Administered 2013-07-27 – 2013-07-29 (×5): 600 mg via ORAL
  Filled 2013-07-27 (×6): qty 1

## 2013-07-27 MED ORDER — IPRATROPIUM-ALBUTEROL 0.5-2.5 (3) MG/3ML IN SOLN
3.0000 mL | Freq: Three times a day (TID) | RESPIRATORY_TRACT | Status: DC
  Administered 2013-07-27 – 2013-07-28 (×2): 3 mL via RESPIRATORY_TRACT
  Filled 2013-07-27 (×2): qty 3

## 2013-07-27 NOTE — Progress Notes (Signed)
Inpatient Diabetes Program Recommendations  AACE/ADA: New Consensus Statement on Inpatient Glycemic Control (2013)  Target Ranges:  Prepandial:   less than 140 mg/dL      Peak postprandial:   less than 180 mg/dL (1-2 hours)      Critically ill patients:  140 - 180 mg/dL     Results for TAAJ, HURLBUT (MRN [id number]) as of 07/27/2013 10:46  Ref. Range 07/27/2013 07:27 07/27/2013 08:25  Glucose-Capillary Latest Range: 70-99 mg/dL 66 (L) 70    **Mild hypoglycemia this morning after 20 units Lantus the day before.   **MD- Please consider decreasing Lantus to 15 units daily if patient continues to have early AM hypoglycemia     Will follow. Wyn Quaker RN, MSN, CDE Diabetes Coordinator Inpatient Diabetes Program Team Pager: [phone] (8a-10p)

## 2013-07-27 NOTE — Progress Notes (Signed)
Subjective:   Feeling much better, appetite improved. May go home tomorrow.  Objective Filed Vitals:   07/26/13 2000 07/26/13 2026 07/27/13 0550 07/27/13 0742  BP: 111/55  121/57   Pulse: 75  71   Temp: 98.1 F (36.7 C)  98.4 F (36.9 C)   TempSrc: Oral  Oral   Resp: 21  16   Height:      Weight:   71.9 kg (158 lb 8.2 oz)   SpO2: 92% 92% 98% 98%   Physical Exam General: alert and oriented. No acute distress. Heart: RRR no murmur Lungs: CTA. unlabored Abdomen: soft, nontender. +BS Extremities: No edema Dialysis Access:  R AVF +bruit/thrill   Dialysis Orders: MWF @ [facility]  4 hr 74kg 2K/2.25Ca 3000Heparin 400/1.5 R AVF  hectorol 21mcg IV/HD Epogen 3600 Units IV/HD Venofer 50/wk   Assessment/Plan:  1. SOB- work up per primary- was given zpak 2/23- failed treatment. chest xray- PNA; vanc per pharm. Influenza A+ tamilfu.  WBC 4.5. Afebrile. Blood cultures NGTD- still on azactam and vanc ? Would change to PO abx 2. Diarrhea- Cdiff +,flagyl- per primary 3. ESRD - MWF @[facility]  K+3.7 HD pending for tomorrow. EDW is 74, current wt 72, will need dec at DC 4. Hypertension/volume - 117/60, watch BP Coreg on hold  5. Anemia - hgb 10.4 watch CBC restart ESA, hold iron (last t sat 49)  6. Metabolic bone disease - Ca+ 8.8, cont hectorol. Phos 4.6 phoslo with meals. PTH 149 7. Nutrition - Alb 2.3 appetite improving renal diet, multivit, will add nepro 8. DM- per primary 9. History of heart transplant in 2001- on imuran and prograf   Shelle Iron, NP [place] [phone] 07/27/2013,9:13 AM  LOS: 3 days   Patient seen and examined, agree with above note with above modifications. He looks great, seems to think he is going home tomorrow after HD.  Will plan for first shift Corliss Parish, MD 07/27/2013      Additional Objective Labs: Basic Metabolic Panel:  Recent Labs Lab 07/24/13 0830 07/24/13 1700 07/25/13 0636 07/26/13 0827  NA 145  --  140 136*  K 3.7   --  4.4 3.7  CL 98  --  96 94*  CO2 34*  --  27 25  GLUCOSE 102*  --  73 69*  BUN 18  --  29* 50*  CREATININE 4.64* 5.32* 6.41* 8.22*  CALCIUM 9.0  --  8.6 8.8  PHOS  --   --   --  4.6   Liver Function Tests:  Recent Labs Lab 07/26/13 0827  ALBUMIN 2.3*   No results found for this basename: LIPASE, AMYLASE,  in the last 168 hours CBC:  Recent Labs Lab 07/24/13 0830 07/24/13 1700 07/25/13 0636 07/26/13 0827  WBC 5.7 4.6 5.0 4.5  NEUTROABS 4.1  --   --   --   HGB 11.2* 11.2* 11.1* 10.4*  HCT 35.4* 34.5* 34.9* 31.6*  MCV 108.9* 108.2* 106.7* 103.6*  PLT 64* 57* 69* 92*   Blood Culture    Component Value Date/Time   SDES BLOOD LEFT HAND 07/24/2013 1700   SPECREQUEST BOTTLES DRAWN AEROBIC AND ANAEROBIC 10CC 07/24/2013 1700   CULT  Value:        BLOOD CULTURE RECEIVED NO GROWTH TO DATE CULTURE WILL BE HELD FOR 5 DAYS BEFORE ISSUING A FINAL NEGATIVE REPORT Performed at [facility] 07/24/2013 1700   REPTSTATUS PENDING 07/24/2013 1700    Cardiac Enzymes:  Recent Labs Lab  07/25/13 1030 07/25/13 1350 07/25/13 2005  TROPONINI <0.30 <0.30 <0.30   CBG:  Recent Labs Lab 07/26/13 1236 07/26/13 1638 07/26/13 2124 07/27/13 0727 07/27/13 0825  GLUCAP 73 186* 147* 66* 70   Iron Studies: No results found for this basename: IRON, TIBC, TRANSFERRIN, FERRITIN,  in the last 72 hours @lablastinr3 @ Studies/Results: No results found. Medications:   . aspirin EC  81 mg Oral Daily  . atorvastatin  40 mg Oral Daily  . azaTHIOprine  75 mg Oral Daily  . aztreonam  1 g Intravenous Q24H  . budesonide (PULMICORT) nebulizer solution  0.25 mg Nebulization BID  . calcium acetate  667 mg Oral TID WC  . doxercalciferol  2 mcg Intravenous Q M,W,F-HD  . enoxaparin (LOVENOX) injection  30 mg Subcutaneous Q24H  . feeding supplement (NEPRO CARB STEADY)  237 mL Oral BID BM  . insulin aspart  0-5 Units Subcutaneous QHS  . insulin aspart  0-9 Units Subcutaneous TID WC  . insulin  glargine  20 Units Subcutaneous Daily  . ipratropium-albuterol  3 mL Nebulization TID  . metroNIDAZOLE  500 mg Oral 3 times per day  . multivitamin  1 tablet Oral QHS  . oseltamivir  30 mg Oral Q M,W,F-HD  . predniSONE  10 mg Oral Daily  . saccharomyces boulardii  250 mg Oral Daily  . tacrolimus  1.5 mg Oral BID  . vancomycin  750 mg Intravenous Q M,W,F-HD

## 2013-07-27 NOTE — Progress Notes (Signed)
ANTIBIOTIC CONSULT NOTE - FOLLOW UP  Pharmacy Consult for Vancomycin and Aztreonam Indication: pneumonia  Allergies  Allergen Reactions  . Lisinopril Swelling    Lips and tongue swell  . Norvasc [Amlodipine Besylate] Rash    Flushing  . Penicillins Rash    Patient Measurements: Height: 6' 0.05" (183 cm) Weight: 158 lb 8.2 oz (71.9 kg) IBW/kg (Calculated) : 77.72  Vital Signs: Temp: 98.2 F (36.8 C) (03/03 1415) Temp src: Oral (03/03 1415) BP: 110/54 mmHg (03/03 1415) Pulse Rate: 62 (03/03 1415) Intake/Output from previous day: 03/02 0701 - 03/03 0700 In: 680 [P.O.:480; IV Piggyback:200] Out: 1301  Intake/Output from this shift: Total I/O In: 480 [P.O.:480] Out: 0   Labs:  Recent Labs  07/24/13 1700 07/25/13 0636 07/26/13 0827  WBC 4.6 5.0 4.5  HGB 11.2* 11.1* 10.4*  PLT 57* 69* 92*  CREATININE 5.32* 6.41* 8.22*   Estimated Creatinine Clearance: 8.7 ml/min (by C-G formula based on Cr of 8.22). No results found for this basename: VANCOTROUGH, Corlis Leak, VANCORANDOM, [place], [place], [place], [place], TOBRAPEAK, TOBRARND, AMIKACINPEAK, AMIKACINTROU, AMIKACIN,  in the last 72 hours   Microbiology: Recent Results (from the past 720 hour(s))  CULTURE, BLOOD (ROUTINE X 2)     Status: None   Collection Time    07/24/13  8:40 AM      Result Value Ref Range Status   Specimen Description BLOOD LEFT ARM   Final   Special Requests BOTTLES DRAWN AEROBIC AND ANAEROBIC 10CC   Final   Culture  Setup Time     Final   Value: 07/24/2013 16:43     Performed at [facility]   Culture     Final   Value:        BLOOD CULTURE RECEIVED NO GROWTH TO DATE CULTURE WILL BE HELD FOR 5 DAYS BEFORE ISSUING A FINAL NEGATIVE REPORT     Performed at [facility]   Report Status PENDING   Incomplete  CULTURE, EXPECTORATED SPUTUM-ASSESSMENT     Status: None   Collection Time    07/24/13  3:38 PM      Result Value Ref Range Status   Specimen Description SPUTUM    Final   Special Requests NONE   Final   Sputum evaluation     Final   Value: THIS SPECIMEN IS ACCEPTABLE. RESPIRATORY CULTURE REPORT TO FOLLOW.   Report Status 07/24/2013 FINAL   Final  CULTURE, RESPIRATORY (NON-EXPECTORATED)     Status: None   Collection Time    07/24/13  3:38 PM      Result Value Ref Range Status   Specimen Description SPUTUM   Final   Special Requests NONE   Final   Gram Stain     Final   Value: ABUNDANT WBC PRESENT, PREDOMINANTLY PMN     FEW SQUAMOUS EPITHELIAL CELLS PRESENT     ABUNDANT GRAM POSITIVE RODS     FEW GRAM POSITIVE COCCI IN PAIRS     IN CHAINS FEW GRAM NEGATIVE RODS   Culture     Final   Value: NORMAL OROPHARYNGEAL FLORA     Performed at [facility]   Report Status 07/26/2013 FINAL   Final  CULTURE, BLOOD (ROUTINE X 2)     Status: None   Collection Time    07/24/13  5:00 PM      Result Value Ref Range Status   Specimen Description BLOOD LEFT HAND   Final   Special Requests BOTTLES DRAWN AEROBIC AND ANAEROBIC  10CC   Final   Culture  Setup Time     Final   Value: 07/24/2013 21:30     Performed at [facility]   Culture     Final   Value:        BLOOD CULTURE RECEIVED NO GROWTH TO DATE CULTURE WILL BE HELD FOR 5 DAYS BEFORE ISSUING A FINAL NEGATIVE REPORT     Performed at [facility]   Report Status PENDING   Incomplete  CLOSTRIDIUM DIFFICILE BY PCR     Status: Abnormal   Collection Time    07/24/13  6:56 PM      Result Value Ref Range Status   C difficile by pcr POSITIVE (*) NEGATIVE Final   Comment: CRITICAL RESULT CALLED TO, READ BACK BY AND VERIFIED WITH:     A LEONAR,RN 1105 07/25/13 SCALES H  MRSA PCR SCREENING     Status: None   Collection Time    07/24/13  8:25 PM      Result Value Ref Range Status   MRSA by PCR NEGATIVE  NEGATIVE Final   Comment:            The GeneXpert MRSA Assay (FDA     approved for NASAL specimens     only), is one component of a     comprehensive MRSA colonization      surveillance program. It is not     intended to diagnose MRSA     infection nor to guide or     monitor treatment for     MRSA infections.    Anti-infectives   Start     Dose/Rate Route Frequency Ordered Stop   07/26/13 1200  vancomycin (VANCOCIN) IVPB 750 mg/150 ml premix     750 mg 150 mL/hr over 60 Minutes Intravenous Every M-W-F (Hemodialysis) 07/24/13 1529     07/26/13 1200  oseltamivir (TAMIFLU) capsule 30 mg     30 mg Oral Every M-W-F (Hemodialysis) 07/25/13 0834 08/02/13 1159   07/25/13 1400  metroNIDAZOLE (FLAGYL) tablet 500 mg     500 mg Oral 3 times per day 07/25/13 1346     07/25/13 1245  vancomycin (VANCOCIN) 1,500 mg in sodium chloride 0.9 % 500 mL IVPB     1,500 mg 250 mL/hr over 120 Minutes Intravenous  Once 07/25/13 1238 07/25/13 1709   07/25/13 1000  oseltamivir (TAMIFLU) capsule 75 mg  Status:  Discontinued     75 mg Oral 2 times daily 07/25/13 0827 07/25/13 0833   07/25/13 0845  oseltamivir (TAMIFLU) capsule 30 mg     30 mg Oral  Once 07/25/13 0834 07/25/13 1034   07/24/13 1800  aztreonam (AZACTAM) 1 g in dextrose 5 % 50 mL IVPB     1 g 100 mL/hr over 30 Minutes Intravenous Every 24 hours 07/24/13 1455     07/24/13 1530  vancomycin (VANCOCIN) 1,600 mg in sodium chloride 0.9 % 500 mL IVPB  Status:  Discontinued     1,600 mg 250 mL/hr over 120 Minutes Intravenous  Once 07/24/13 1527 07/24/13 1534   07/24/13 1445  aztreonam (AZACTAM) 2 g in dextrose 5 % 50 mL IVPB  Status:  Discontinued     2 g 100 mL/hr over 30 Minutes Intravenous 3 times per day 07/24/13 1434 07/24/13 1455   07/24/13 1400  vancomycin (VANCOCIN) 1,500 mg in sodium chloride 0.9 % 500 mL IVPB  Status:  Discontinued     1,500 mg 250 mL/hr over 120  Minutes Intravenous  Once 07/24/13 1534 07/25/13 1238   07/24/13 0745  vancomycin (VANCOCIN) IVPB 1000 mg/200 mL premix     1,000 mg 200 mL/hr over 60 Minutes Intravenous  Once 07/24/13 0739 07/24/13 1117   07/24/13 0730  aztreonam (AZACTAM) 2 g in  dextrose 5 % 50 mL IVPB     2 g 100 mL/hr over 30 Minutes Intravenous  Once 07/24/13 0093 07/24/13 0955      Assessment: 68 yo M continues on day #4 of antibiotic therapy for presumed HCAP.  Pt also +Flu and +Cdiff for which he continues on Tamiflu and Metronidazole as well.  Blood cx ngtd, sputum cx nl flora.  Pt is afebrile, WBC wnl, clinically improving.  Noted possible plans for d/c home tomorrow.  Goal of Therapy:  Vancomycin pre-HD 15-25 mcg/ml  Plan:  Continue aztreonam 1g q24h and give post-HD on HD days MWF Continue Vanc 750 mg IV post-HD on MWF Follow up plans to change to oral antibiotics tomorrow?   [employer], Pharm.D., BCPS Clinical Pharmacist Pager [phone] 07/27/2013 3:40 PM

## 2013-07-27 NOTE — Progress Notes (Signed)
  Echocardiogram 2D Echocardiogram has been performed.  Brandon Sharp 07/27/2013, 3:18 PM

## 2013-07-27 NOTE — Progress Notes (Signed)
[facility] PROGRESS NOTE  Brandon Sharp EML:[id number] DOB: [birth year] DOA: 07/24/2013 PCP: Eartha Inch, MD  Assessment/Plan: 1-SOB/cough/fever: secondary to HCAP and influenza -will continue broad spectrum antibiotics at least X 1 more day -continue tamiflu and pulmicort -continue supportive care -PRN antitussives and antipyretics  -repeat CXR in am  2-C. Diff enteritis: -continue on flagyl -patient on contact precautions -no significant diarrhea according to patient  3-DM type 2: -last A1C 6.4 (2/28) -continue SSI and lantus  4-ESRD: renal service contacted to continue HD while inpatient. -will follow rec's -next HD tomorrow.  5-Generalized weakness and deconditioning: due to #1. -PT has evaluated patient and is recommending HHPT at discharge. Will follow progress and response; patient currently declining any services.  6-HLD: continue statin  7-CAD and bradycardia: status post PCI 2 months ago. -will follow 2-d echo, troponin negative and TSH WNL  -will resume coreg low dose -HR stable   8-Hx of heart transplant: -continue prograf and tacrolimus   DVT: lovenox  Code Status: Full Family Communication: no family at bedside  Disposition Plan: home when medically stable (currently declining HHPT)   Consultants:  Renal service  Procedures:  See below for x-ray reports  Antibiotics:  Aztreonam 2/28  Vancomycin 2/28  Flagyl 3/01  tamiflu 3/01  HPI/Subjective: Feeling a lot better. Denies CP or significant SOB. Reports diarrhea has stopped  Objective: Filed Vitals:   07/27/13 0915  BP: 117/60  Pulse: 74  Temp: 98.1 F (36.7 C)  Resp: 16    Intake/Output Summary (Last 24 hours) at 07/27/13 1221 Last data filed at 07/27/13 7121  Gross per 24 hour  Intake    770 ml  Output      0 ml  Net    770 ml   Filed Weights   07/25/13 2100 07/26/13 0810 07/27/13 0550  Weight: 74.3 kg (163 lb 12.8 oz) 73.2 kg (161 lb 6 oz) 71.9 kg (158  lb 8.2 oz)    Exam:   General:  Remains afebrile, feeling better; no diarrhea in last 24 hours.  Cardiovascular:S1 and S2, no rubs or gallops  Respiratory: improved air movement, scattered rhonchi, no exp wheezing   Abdomen: soft, ND, positive BS  Musculoskeletal: no joint swelling or erythema  Data Reviewed: Basic Metabolic Panel:  Recent Labs Lab 07/24/13 0830 07/24/13 1700 07/25/13 0636 07/26/13 0827  NA 145  --  140 136*  K 3.7  --  4.4 3.7  CL 98  --  96 94*  CO2 34*  --  27 25  GLUCOSE 102*  --  73 69*  BUN 18  --  29* 50*  CREATININE 4.64* 5.32* 6.41* 8.22*  CALCIUM 9.0  --  8.6 8.8  PHOS  --   --   --  4.6   CBC:  Recent Labs Lab 07/24/13 0830 07/24/13 1700 07/25/13 0636 07/26/13 0827  WBC 5.7 4.6 5.0 4.5  NEUTROABS 4.1  --   --   --   HGB 11.2* 11.2* 11.1* 10.4*  HCT 35.4* 34.5* 34.9* 31.6*  MCV 108.9* 108.2* 106.7* 103.6*  PLT 64* 57* 69* 92*   Cardiac Enzymes:  Recent Labs Lab 07/25/13 1030 07/25/13 1350 07/25/13 2005  TROPONINI <0.30 <0.30 <0.30   CBG:  Recent Labs Lab 07/26/13 1236 07/26/13 1638 07/26/13 2124 07/27/13 0727 07/27/13 0825  GLUCAP 73 186* 147* 66* 70    Recent Results (from the past 240 hour(s))  CULTURE, BLOOD (ROUTINE X 2)     Status: None  Collection Time    07/24/13  8:40 AM      Result Value Ref Range Status   Specimen Description BLOOD LEFT ARM   Final   Special Requests BOTTLES DRAWN AEROBIC AND ANAEROBIC 10CC   Final   Culture  Setup Time     Final   Value: 07/24/2013 16:43     Performed at [facility]   Culture     Final   Value:        BLOOD CULTURE RECEIVED NO GROWTH TO DATE CULTURE WILL BE HELD FOR 5 DAYS BEFORE ISSUING A FINAL NEGATIVE REPORT     Performed at [facility]   Report Status PENDING   Incomplete  CULTURE, EXPECTORATED SPUTUM-ASSESSMENT     Status: None   Collection Time    07/24/13  3:38 PM      Result Value Ref Range Status   Specimen Description SPUTUM    Final   Special Requests NONE   Final   Sputum evaluation     Final   Value: THIS SPECIMEN IS ACCEPTABLE. RESPIRATORY CULTURE REPORT TO FOLLOW.   Report Status 07/24/2013 FINAL   Final  CULTURE, RESPIRATORY (NON-EXPECTORATED)     Status: None   Collection Time    07/24/13  3:38 PM      Result Value Ref Range Status   Specimen Description SPUTUM   Final   Special Requests NONE   Final   Gram Stain     Final   Value: ABUNDANT WBC PRESENT, PREDOMINANTLY PMN     FEW SQUAMOUS EPITHELIAL CELLS PRESENT     ABUNDANT GRAM POSITIVE RODS     FEW GRAM POSITIVE COCCI IN PAIRS     IN CHAINS FEW GRAM NEGATIVE RODS   Culture     Final   Value: NORMAL OROPHARYNGEAL FLORA     Performed at [facility]   Report Status 07/26/2013 FINAL   Final  CULTURE, BLOOD (ROUTINE X 2)     Status: None   Collection Time    07/24/13  5:00 PM      Result Value Ref Range Status   Specimen Description BLOOD LEFT HAND   Final   Special Requests BOTTLES DRAWN AEROBIC AND ANAEROBIC 10CC   Final   Culture  Setup Time     Final   Value: 07/24/2013 21:30     Performed at [facility]   Culture     Final   Value:        BLOOD CULTURE RECEIVED NO GROWTH TO DATE CULTURE WILL BE HELD FOR 5 DAYS BEFORE ISSUING A FINAL NEGATIVE REPORT     Performed at [facility]   Report Status PENDING   Incomplete  CLOSTRIDIUM DIFFICILE BY PCR     Status: Abnormal   Collection Time    07/24/13  6:56 PM      Result Value Ref Range Status   C difficile by pcr POSITIVE (*) NEGATIVE Final   Comment: CRITICAL RESULT CALLED TO, READ BACK BY AND VERIFIED WITH:     A LEONAR,RN 1105 07/25/13 SCALES H  MRSA PCR SCREENING     Status: None   Collection Time    07/24/13  8:25 PM      Result Value Ref Range Status   MRSA by PCR NEGATIVE  NEGATIVE Final   Comment:            The GeneXpert MRSA Assay (FDA     approved for NASAL  specimens     only), is one component of a     comprehensive MRSA colonization     surveillance  program. It is not     intended to diagnose MRSA     infection nor to guide or     monitor treatment for     MRSA infections.     Studies: No results found.  Scheduled Meds: . aspirin EC  81 mg Oral Daily  . atorvastatin  40 mg Oral Daily  . azaTHIOprine  75 mg Oral Daily  . aztreonam  1 g Intravenous Q24H  . budesonide (PULMICORT) nebulizer solution  0.25 mg Nebulization BID  . calcium acetate  667 mg Oral TID WC  . doxercalciferol  2 mcg Intravenous Q M,W,F-HD  . enoxaparin (LOVENOX) injection  30 mg Subcutaneous Q24H  . feeding supplement (NEPRO CARB STEADY)  237 mL Oral BID BM  . insulin aspart  0-5 Units Subcutaneous QHS  . insulin aspart  0-9 Units Subcutaneous TID WC  . insulin glargine  20 Units Subcutaneous Daily  . ipratropium-albuterol  3 mL Nebulization TID  . metroNIDAZOLE  500 mg Oral 3 times per day  . multivitamin  1 tablet Oral QHS  . oseltamivir  30 mg Oral Q M,W,F-HD  . predniSONE  10 mg Oral Daily  . saccharomyces boulardii  250 mg Oral Daily  . tacrolimus  1.5 mg Oral BID  . vancomycin  750 mg Intravenous Q M,W,F-HD    Time spent: >30 minutes   Lasalle Abee  [facility] Pager [phone]. If 7PM-7AM, please contact night-coverage at [email or web address], password [facility] 07/27/2013, 12:21 PM  LOS: 3 days

## 2013-07-27 NOTE — Progress Notes (Signed)
Utilization review completed.  

## 2013-07-28 ENCOUNTER — Inpatient Hospital Stay (HOSPITAL_COMMUNITY): Payer: Medicare Other

## 2013-07-28 LAB — RENAL FUNCTION PANEL
Albumin: 2.3 g/dL — ABNORMAL LOW (ref 3.5–5.2)
BUN: 50 mg/dL — ABNORMAL HIGH (ref 6–23)
CO2: 24 meq/L (ref 19–32)
CREATININE: 7.99 mg/dL — AB (ref 0.50–1.35)
Calcium: 8.5 mg/dL (ref 8.4–10.5)
Chloride: 98 mEq/L (ref 96–112)
GFR calc Af Amer: 7 mL/min — ABNORMAL LOW (ref >90)
GFR calc non Af Amer: 6 mL/min — ABNORMAL LOW (ref >90)
GLUCOSE: 63 mg/dL — AB (ref 70–99)
Phosphorus: 3.6 mg/dL (ref 2.3–4.6)
Potassium: 4.5 mEq/L (ref 3.7–5.3)
Sodium: 140 mEq/L (ref 137–147)

## 2013-07-28 LAB — CBC
HEMATOCRIT: 36.6 % — AB (ref 39.0–52.0)
HEMOGLOBIN: 12.1 g/dL — AB (ref 13.0–17.0)
MCH: 34.3 pg — ABNORMAL HIGH (ref 26.0–34.0)
MCHC: 33.1 g/dL (ref 30.0–36.0)
MCV: 103.7 fL — AB (ref 78.0–100.0)
Platelets: 95 10*3/uL — ABNORMAL LOW (ref 150–400)
RBC: 3.53 MIL/uL — AB (ref 4.22–5.81)
RDW: 14.6 % (ref 11.5–15.5)
WBC: 4.3 10*3/uL (ref 4.0–10.5)

## 2013-07-28 LAB — GLUCOSE, CAPILLARY
GLUCOSE-CAPILLARY: 75 mg/dL (ref 70–99)
GLUCOSE-CAPILLARY: 146 mg/dL — AB (ref 70–99)
GLUCOSE-CAPILLARY: 155 mg/dL — AB (ref 70–99)
Glucose-Capillary: 58 mg/dL — ABNORMAL LOW (ref 70–99)

## 2013-07-28 MED ORDER — DOXERCALCIFEROL 4 MCG/2ML IV SOLN
INTRAVENOUS | Status: AC
  Administered 2013-07-28: 2 ug
  Filled 2013-07-28: qty 2

## 2013-07-28 MED ORDER — DARBEPOETIN ALFA-POLYSORBATE 40 MCG/0.4ML IJ SOLN
INTRAMUSCULAR | Status: AC
  Administered 2013-07-28: 40 ug
  Filled 2013-07-28: qty 0.4

## 2013-07-28 MED ORDER — DOXYCYCLINE HYCLATE 100 MG PO TABS
100.0000 mg | ORAL_TABLET | Freq: Two times a day (BID) | ORAL | Status: DC
  Administered 2013-07-28 – 2013-07-29 (×2): 100 mg via ORAL
  Filled 2013-07-28 (×3): qty 1

## 2013-07-28 MED ORDER — IPRATROPIUM-ALBUTEROL 0.5-2.5 (3) MG/3ML IN SOLN
3.0000 mL | Freq: Two times a day (BID) | RESPIRATORY_TRACT | Status: DC
  Administered 2013-07-29: 3 mL via RESPIRATORY_TRACT
  Filled 2013-07-28: qty 3

## 2013-07-28 MED ORDER — INSULIN GLARGINE 100 UNIT/ML ~~LOC~~ SOLN
10.0000 [IU] | Freq: Every day | SUBCUTANEOUS | Status: DC
  Administered 2013-07-28 – 2013-07-29 (×2): 10 [IU] via SUBCUTANEOUS
  Filled 2013-07-28 (×2): qty 0.1

## 2013-07-28 MED ORDER — PENTAFLUOROPROP-TETRAFLUOROETH EX AERO: 1.0000 "application " | INHALATION_SPRAY | CUTANEOUS | Status: DC | PRN

## 2013-07-28 MED ORDER — LIDOCAINE-PRILOCAINE 2.5-2.5 % EX CREA: 1.0000 "application " | TOPICAL_CREAM | CUTANEOUS | Status: DC | PRN

## 2013-07-28 MED ORDER — HEPARIN SODIUM (PORCINE) 1000 UNIT/ML DIALYSIS: 1000.0000 [IU] | INTRAMUSCULAR | Status: DC | PRN

## 2013-07-28 MED ORDER — ALTEPLASE 2 MG IJ SOLR
2.0000 mg | Freq: Once | INTRAMUSCULAR | Status: DC | PRN
  Filled 2013-07-28: qty 2

## 2013-07-28 MED ORDER — DARBEPOETIN ALFA-POLYSORBATE 40 MCG/0.4ML IJ SOLN
40.0000 ug | INTRAMUSCULAR | Status: DC
  Filled 2013-07-28: qty 0.4

## 2013-07-28 MED ORDER — SODIUM CHLORIDE 0.9 % IV SOLN
100.0000 mL | INTRAVENOUS | Status: DC | PRN
Start: 2013-07-28 — End: 2013-07-28

## 2013-07-28 MED ORDER — NEPRO/CARBSTEADY PO LIQD: 237.0000 mL | ORAL | Status: DC | PRN

## 2013-07-28 MED ORDER — LIDOCAINE HCL (PF) 1 % IJ SOLN: 5.0000 mL | INTRAMUSCULAR | Status: DC | PRN

## 2013-07-28 MED ORDER — SODIUM CHLORIDE 0.9 % IV SOLN: 100.0000 mL | INTRAVENOUS | Status: DC | PRN

## 2013-07-28 MED ORDER — HEPARIN SODIUM (PORCINE) 1000 UNIT/ML DIALYSIS
3000.0000 [IU] | Freq: Once | INTRAMUSCULAR | Status: AC
  Administered 2013-07-28: 3000 [IU] via INTRAVENOUS_CENTRAL
  Filled 2013-07-28: qty 3

## 2013-07-28 NOTE — Procedures (Signed)
Patient was seen on dialysis and the procedure was supervised.  BFR 400  Via AVF BP is  108/66.   Patient appears to be tolerating treatment well  Brandon Sharp A 07/28/2013

## 2013-07-28 NOTE — Progress Notes (Deleted)
Pt's status discussed with son Chalmers Cater and Dr. Verlon Au. Chalmers Cater wants to ensure that we are doing nothing to prolong his dad's life. Pt is currently on a nonrebreather mask, and decision was made between myself, Elbert and Dr. Verlon Au to transition pt from nonrebreather to a nasal canula. Chalmers Cater is in support of this decision.

## 2013-07-28 NOTE — Progress Notes (Signed)
Physical Therapy Treatment Patient Details Name: Brandon Sharp MRN: [id number] DOB: 19-Jun-1945 Today's Date: 07/28/2013 Time: 2951-8841 PT Time Calculation (min): 26 min  PT Assessment / Plan / Recommendation  History of Present Illness Patient is a 68 year old male with ESRD on HD MWF, diabetes has been having increasing cough and congestion over the last 2 weeks. Admitted with HCAP, diarrhea, dehydration, and bradycardia.   PT Comments   Session focused on increasing mobility and addressing stair management. Pt very hopeful to D/C home today. Requires min guard for stair management and cues for safety. Pt continues to fatigue quickly and required multiple sitting rest breaks during session. Cues for deep breathing throughout session. Will cont to follow per POC. HHPT recommended upon D/c.   Follow Up Recommendations  Home health PT;Supervision/Assistance - 24 hour     Does the patient have the potential to tolerate intense rehabilitation     Barriers to Discharge        Equipment Recommendations  Rolling walker with 5" wheels    Recommendations for Other Services    Frequency Min 3X/week   Progress towards PT Goals Progress towards PT goals: Progressing toward goals  Plan Current plan remains appropriate    Precautions / Restrictions Precautions Precautions: Fall Restrictions Weight Bearing Restrictions: No   Pertinent Vitals/Pain Denies any pain. Fatigued quickly.    Mobility  Bed Mobility General bed mobility comments: Pt sitting in chair and returned to chair  Transfers Overall transfer level: Needs assistance Equipment used: Straight cane Transfers: Sit to/from Stand Sit to Stand: Min guard General transfer comment: min guard intially for sit to stand from recliner; performed sit to stand x2 from chair in hallway at supervision for min cues for safety only; continues to have flexed posture with standing  Ambulation/Gait Ambulation/Gait assistance:  Supervision Ambulation Distance (Feet): 200 Feet (with 2 standing rest breaks ) Assistive device: Straight cane Gait Pattern/deviations: Decreased stance time - right;Decreased step length - left;Wide base of support;Trunk flexed;Drifts right/left Gait velocity: Slow gait speed Gait velocity interpretation: Below normal speed for age/gender General Gait Details: pt requesting to ambulate with SPC today; slightly unsteady with cane and fatigues quickly; cues for safe sequencing and upright posture; pt requried rest breaks due to fatigue and at end of session was reaching for support in other UE; encouraged to ambulate with RW to improve balance and help incr activity tolerance.  Stairs: Yes Stairs assistance: Min guard Stair Management: One rail Right;Step to pattern;Forwards Number of Stairs: 3 General stair comments: cues for sequencing and technique; pt relied heavily on handrail; reports he has "something" on his Rt hand side to support him with stair management; min guard to steady and for safety     Exercises General Exercises - Lower Extremity Ankle Circles/Pumps: AROM;Both;Strengthening;10 reps;Seated Long Arc Quad: AROM;Both;10 reps;Seated Hip ABduction/ADduction: AROM;Strengthening;Both;10 reps;Seated Hip Flexion/Marching: AROM;Both;10 reps;Seated;Strengthening   PT Diagnosis:    PT Problem List:   PT Treatment Interventions:     PT Goals (current goals can now be found in the care plan section) Acute Rehab PT Goals Patient Stated Goal: home today! PT Goal Formulation: With patient Time For Goal Achievement: 08/01/13 Potential to Achieve Goals: Good  Visit Information  Last PT Received On: 07/28/13 Assistance Needed: +1 History of Present Illness: Patient is a 68 year old male with ESRD on HD MWF, diabetes has been having increasing cough and congestion over the last 2 weeks. Admitted with HCAP, diarrhea, dehydration, and bradycardia.    Subjective Data  Subjective:  pt  sitting in chair "i thought i was going home" agreeable to attempt to ambulate steps today in case he is D/C home  Patient Stated Goal: home today!   Cognition  Cognition Arousal/Alertness: Awake/alert Behavior During Therapy: WFL for tasks assessed/performed Overall Cognitive Status: Within Functional Limits for tasks assessed    Balance  Balance Overall balance assessment: Needs assistance Sitting-balance support: Feet supported;No upper extremity supported Sitting balance-Leahy Scale: Good Standing balance support: Single extremity supported Standing balance-Leahy Scale: Fair Standing balance comment: has difficulty with weightshifting and balance activities with single UE supported   End of Session PT - End of Session Equipment Utilized During Treatment: Gait belt Activity Tolerance: Patient tolerated treatment well Patient left: in chair;with call bell/phone within reach Nurse Communication: Mobility status   GP     [name], [place] 07/28/2013, 5:03 PM

## 2013-07-28 NOTE — Progress Notes (Signed)
Hypoglycemic Event  CBG: 58  Treatment: 15 GM carbohydrate snack  Symptoms: None  Follow-up CBG: Time: 12:19 CBG Result: 75  Possible Reasons for Event: Inadequate meal intake  Comments/MD notified: MD Samtani notified    Tawni Melkonian L  Remember to initiate Hypoglycemia Order Set & complete

## 2013-07-28 NOTE — Progress Notes (Signed)
PT Cancellation Note  Patient Details Name: Brandon Sharp MRN: [id number] DOB: [birth year]   Cancelled Treatment:     Pt off floor at hemodialysis. Will re-attempt to see pt when he returns to the floor.    Elie Confer [place], [place] 07/28/2013, 9:31 AM

## 2013-07-28 NOTE — Progress Notes (Signed)
Subjective:   Feels great, ready to go home  Objective Filed Vitals:   07/28/13 0726 07/28/13 0800 07/28/13 0830 07/28/13 0900  BP: 135/73 132/70 113/65 120/65  Pulse: 70 78 74 76  Temp:      TempSrc:      Resp:      Height:      Weight:      SpO2:       Physical Exam General: alert and oriented. No acute distress. On HD Heart: RRR, no murmur Lungs: CTA, unlabored. Abdomen: soft, nontender. +BS Extremities: No Le edema Dialysis Access: R AVF  Dialysis Orders: MWF @ [facility]  4 hr 74kg 2K/2.25Ca 3000Heparin 400/1.5 R AVF  hectorol 61mcg IV/HD Epogen 3600 Units IV/HD Venofer 50/wk   Assessment/Plan:  1. SOB- work up per primary- was given zpak 2/23- failed treatment. chest xray- PNA; vanc per pharm. Influenza A+ tamilfu. WBC 4.5. Afebrile. Blood cultures NGTD-needs po antibx for DC 2. Diarrhea- Cdiff +,flagyl- per primary 3. ESRD - MWF @[facility]  K+4.5 HD today uf goal 69, old green. will need dec edw at DC 4. Hypertension/volume - 120/65,  Coreg restarted last night 5. Anemia - hgb 12.1 watch CBC, received ESA, hold iron (last t sat 49)  6. Metabolic bone disease - Ca+ 8.5, cont hectorol. Phos 4.6 phoslo with meals. PTH 149 7. Nutrition - Alb 2.3 appetite improving renal diet, multivit, will add nepro 8. DM- per primary 9. History of heart transplant in 2001- on imuran and prograf  Shelle Iron, NP [place] [phone] 07/28/2013,9:20 AM  LOS: 4 days   Patient seen and examined, agree with above note with above modifications.  Looks great- we think plan is for him to go home today- PO ABX per primary team- next HD will be at OP unit on Friday  Corliss Parish, MD 07/28/2013     Additional Objective Labs: Basic Metabolic Panel:  Recent Labs Lab 07/25/13 0636 07/26/13 0827 07/28/13 0730  NA 140 136* 140  K 4.4 3.7 4.5  CL 96 94* 98  CO2 27 25 24   GLUCOSE 73 69* 63*  BUN 29* 50* 50*  CREATININE 6.41* 8.22* 7.99*  CALCIUM 8.6 8.8 8.5  PHOS  --   4.6 3.6   Liver Function Tests:  Recent Labs Lab 07/26/13 0827 07/28/13 0730  ALBUMIN 2.3* 2.3*   No results found for this basename: LIPASE, AMYLASE,  in the last 168 hours CBC:  Recent Labs Lab 07/24/13 0830 07/24/13 1700 07/25/13 0636 07/26/13 0827 07/28/13 0730  WBC 5.7 4.6 5.0 4.5 4.3  NEUTROABS 4.1  --   --   --   --   HGB 11.2* 11.2* 11.1* 10.4* 12.1*  HCT 35.4* 34.5* 34.9* 31.6* 36.6*  MCV 108.9* 108.2* 106.7* 103.6* 103.7*  PLT 64* 57* 69* 92* 95*   Blood Culture    Component Value Date/Time   SDES BLOOD LEFT HAND 07/24/2013 1700   SPECREQUEST BOTTLES DRAWN AEROBIC AND ANAEROBIC 10CC 07/24/2013 1700   CULT  Value:        BLOOD CULTURE RECEIVED NO GROWTH TO DATE CULTURE WILL BE HELD FOR 5 DAYS BEFORE ISSUING A FINAL NEGATIVE REPORT Performed at [place] 07/24/2013 1700   REPTSTATUS PENDING 07/24/2013 1700    Cardiac Enzymes:  Recent Labs Lab 07/25/13 1030 07/25/13 1350 07/25/13 2005  TROPONINI <0.30 <0.30 <0.30   CBG:  Recent Labs Lab 07/27/13 0727 07/27/13 0825 07/27/13 1226 07/27/13 1654 07/27/13 2213  GLUCAP 66* 70 121* 131*  103*   Iron Studies: No results found for this basename: IRON, TIBC, TRANSFERRIN, FERRITIN,  in the last 72 hours @lablastinr3 @ Studies/Results: No results found. Medications:   . aspirin EC  81 mg Oral Daily  . atorvastatin  40 mg Oral Daily  . azaTHIOprine  75 mg Oral Daily  . aztreonam  1 g Intravenous Q24H  . budesonide (PULMICORT) nebulizer solution  0.25 mg Nebulization BID  . calcium acetate  667 mg Oral TID WC  . carvedilol  3.125 mg Oral BID WC  . darbepoetin (ARANESP) injection - DIALYSIS  40 mcg Intravenous Q Wed-HD  . doxercalciferol  2 mcg Intravenous Q M,W,F-HD  . enoxaparin (LOVENOX) injection  30 mg Subcutaneous Q24H  . feeding supplement (NEPRO CARB STEADY)  237 mL Oral BID BM  . guaiFENesin  600 mg Oral BID  . heparin  3,000 Units Dialysis Once in dialysis  . insulin aspart  0-5 Units  Subcutaneous QHS  . insulin aspart  0-9 Units Subcutaneous TID WC  . insulin glargine  20 Units Subcutaneous Daily  . ipratropium-albuterol  3 mL Nebulization TID  . metroNIDAZOLE  500 mg Oral 3 times per day  . multivitamin  1 tablet Oral QHS  . oseltamivir  30 mg Oral Q M,W,F-HD  . predniSONE  10 mg Oral Daily  . saccharomyces boulardii  250 mg Oral Daily  . tacrolimus  1.5 mg Oral BID  . vancomycin  750 mg Intravenous Q M,W,F-HD

## 2013-07-28 NOTE — Progress Notes (Signed)
[facility] PROGRESS NOTE  Brandon Sharp GNF:[id number] DOB: [birth year] DOA: 07/24/2013 PCP: [name], MD  Summary  68 y/o ?, known ESRD Sep 24, 2003, s/p Transplant [deceased 2007]then needing eventual M/W/F HD since Sep 24, 2006 [2/2 to cyclosporine for Heart transplant] @ [place] [facility], DM ty 2,CAD s/p Heart transplant 09-24-99 on Immunosuppressants, H/o DVT admitted with HCAP, h/o Mutiple [9] polpys c L sided Diverticulosis [Dr. Michail Sermon 03/17/12], LLE claudication vol Depletion on 07/24/13-Was recently place on PO abx 2/23 for Upper respiratory issues.  Was noted to have also diarrhea on admit that eventually was found to be Cdiff +.  Assessment/Plan: 1-SOB/cough/fever:  secondary to HCAP and influenza -Narrowed Aztreonam and Vanc 3/4-->doxycycline BID 100 -continue tamiflu till 3/5 -Cont pulmicort -PRN antitussives and antipyretics   2-C. Diff enteritis: -continue on flagyl 500 po tid-stop date=08/08/13 -patient on contact precautions  3-DM type 2: -last A1C 6.4 (2/28) -continue SSI and lantus  4-ESRD: renal service contacted to continue HD while inpatient. -will follow rec's -reports dry weight ~74 kg  5-Generalized weakness and deconditioning: due to #1. -PT has evaluated patient and is recommending HHPT at discharge +RW + 5" wheels -Will follow progress and response; patient currently declining any services.  6-HLD: continue statin  7-CAD and bradycardia: status post PCI 2 months ago. -Echo=EF 55-60%, mild concentric Hypertrophy - troponin negative and TSH WNL  -will continue holding coreg at this moment -HR stable but borderline low  (initially experiencing bradycardia)  8-Hx of heart transplant: -continue prograf and tacrolimus  -consider monitoring with levesl -needs longer course of abx due to realtive immunosuppresion  DVT: lovenox  Code Status: Full Family Communication: no family at bedside  Disposition Plan: home when medically stable  (currently declining HHPT)   Consultants:  Renal service  Procedures:  See below for x-ray reports  Antibiotics:  Aztreonam 2/28-3/4  Vancomycin 2/28-3/4  Flagyl 3/01---->08/08/13  tamiflu 3/01--->3/5  Doxycycline 07/28/13---> 08/02/13  HPI/Subjective: Feeling a lot better NO soft stool in over 36 hrs Tol diet and no n/v   Objective: Filed Vitals:   07/28/13 1154  BP: 119/63  Pulse: 92  Temp: 97.4 F (36.3 C)  Resp: 19    Intake/Output Summary (Last 24 hours) at 07/28/13 1533 Last data filed at 07/28/13 1121  Gross per 24 hour  Intake    240 ml  Output   2800 ml  Net  -2560 ml   Filed Weights   07/27/13 09/24/14 07/28/13 0700 07/28/13 1121  Weight: 72.6 kg (160 lb 0.9 oz) 74 kg (163 lb 2.3 oz) 71.2 kg (156 lb 15.5 oz)    Exam:   General:  No fever in last 20 hours, feeling better; diarrhea significantly better.  Cardiovascular:S1 and S2, no rubs or gallops  Respiratory: improved air movement, scattered rhonchi, no exp wheezing   Abdomen: soft, ND, positive BS  Musculoskeletal: no joint swelling or erythema  Data Reviewed: Basic Metabolic Panel:  Recent Labs Lab 07/24/13 0830 07/24/13 1700 07/25/13 0636 07/26/13 0827 07/28/13 0730  NA 145  --  140 136* 140  K 3.7  --  4.4 3.7 4.5  CL 98  --  96 94* 98  CO2 34*  --  27 25 24   GLUCOSE 102*  --  73 69* 63*  BUN 18  --  29* 50* 50*  CREATININE 4.64* 5.32* 6.41* 8.22* 7.99*  CALCIUM 9.0  --  8.6 8.8 8.5  PHOS  --   --   --  4.6 3.6   CBC:  Recent Labs Lab 07/24/13 0830 07/24/13 1700 07/25/13 0636 07/26/13 0827 07/28/13 0730  WBC 5.7 4.6 5.0 4.5 4.3  NEUTROABS 4.1  --   --   --   --   HGB 11.2* 11.2* 11.1* 10.4* 12.1*  HCT 35.4* 34.5* 34.9* 31.6* 36.6*  MCV 108.9* 108.2* 106.7* 103.6* 103.7*  PLT 64* 57* 69* 92* 95*   Cardiac Enzymes:  Recent Labs Lab 07/25/13 1030 07/25/13 1350 07/25/13 2005  TROPONINI <0.30 <0.30 <0.30   CBG:  Recent Labs Lab 07/27/13 1226  07/27/13 1654 07/27/13 2213 07/28/13 1150 07/28/13 1219  GLUCAP 121* 131* 103* 58* 75    Recent Results (from the past 240 hour(s))  CULTURE, BLOOD (ROUTINE X 2)     Status: None   Collection Time    07/24/13  8:40 AM      Result Value Ref Range Status   Specimen Description BLOOD LEFT ARM   Final   Special Requests BOTTLES DRAWN AEROBIC AND ANAEROBIC 10CC   Final   Culture  Setup Time     Final   Value: 07/24/2013 16:43     Performed at [facility]   Culture     Final   Value:        BLOOD CULTURE RECEIVED NO GROWTH TO DATE CULTURE WILL BE HELD FOR 5 DAYS BEFORE ISSUING A FINAL NEGATIVE REPORT     Performed at [facility]   Report Status PENDING   Incomplete  CULTURE, EXPECTORATED SPUTUM-ASSESSMENT     Status: None   Collection Time    07/24/13  3:38 PM      Result Value Ref Range Status   Specimen Description SPUTUM   Final   Special Requests NONE   Final   Sputum evaluation     Final   Value: THIS SPECIMEN IS ACCEPTABLE. RESPIRATORY CULTURE REPORT TO FOLLOW.   Report Status 07/24/2013 FINAL   Final  CULTURE, RESPIRATORY (NON-EXPECTORATED)     Status: None   Collection Time    07/24/13  3:38 PM      Result Value Ref Range Status   Specimen Description SPUTUM   Final   Special Requests NONE   Final   Gram Stain     Final   Value: ABUNDANT WBC PRESENT, PREDOMINANTLY PMN     FEW SQUAMOUS EPITHELIAL CELLS PRESENT     ABUNDANT GRAM POSITIVE RODS     FEW GRAM POSITIVE COCCI IN PAIRS     IN CHAINS FEW GRAM NEGATIVE RODS   Culture     Final   Value: NORMAL OROPHARYNGEAL FLORA     Performed at [facility]   Report Status 07/26/2013 FINAL   Final  CULTURE, BLOOD (ROUTINE X 2)     Status: None   Collection Time    07/24/13  5:00 PM      Result Value Ref Range Status   Specimen Description BLOOD LEFT HAND   Final   Special Requests BOTTLES DRAWN AEROBIC AND ANAEROBIC 10CC   Final   Culture  Setup Time     Final   Value: 07/24/2013 21:30      Performed at [facility]   Culture     Final   Value:        BLOOD CULTURE RECEIVED NO GROWTH TO DATE CULTURE WILL BE HELD FOR 5 DAYS BEFORE ISSUING A FINAL NEGATIVE REPORT     Performed at [facility]   Report Status PENDING   Incomplete  CLOSTRIDIUM DIFFICILE BY PCR     Status: Abnormal   Collection Time    07/24/13  6:56 PM      Result Value Ref Range Status   C difficile by pcr POSITIVE (*) NEGATIVE Final   Comment: CRITICAL RESULT CALLED TO, READ BACK BY AND VERIFIED WITH:     A LEONAR,RN 1105 07/25/13 SCALES H  MRSA PCR SCREENING     Status: None   Collection Time    07/24/13  8:25 PM      Result Value Ref Range Status   MRSA by PCR NEGATIVE  NEGATIVE Final   Comment:            The GeneXpert MRSA Assay (FDA     approved for NASAL specimens     only), is one component of a     comprehensive MRSA colonization     surveillance program. It is not     intended to diagnose MRSA     infection nor to guide or     monitor treatment for     MRSA infections.     Studies: No results found.  Scheduled Meds: . aspirin EC  81 mg Oral Daily  . atorvastatin  40 mg Oral Daily  . azaTHIOprine  75 mg Oral Daily  . aztreonam  1 g Intravenous Q24H  . budesonide (PULMICORT) nebulizer solution  0.25 mg Nebulization BID  . calcium acetate  667 mg Oral TID WC  . carvedilol  3.125 mg Oral BID WC  . enoxaparin (LOVENOX) injection  30 mg Subcutaneous Q24H  . feeding supplement (NEPRO CARB STEADY)  237 mL Oral BID BM  . guaiFENesin  600 mg Oral BID  . insulin aspart  0-9 Units Subcutaneous TID WC  . insulin glargine  10 Units Subcutaneous Daily  . ipratropium-albuterol  3 mL Nebulization TID  . metroNIDAZOLE  500 mg Oral 3 times per day  . multivitamin  1 tablet Oral QHS  . oseltamivir  30 mg Oral Q M,W,F-HD  . predniSONE  10 mg Oral Daily  . saccharomyces boulardii  250 mg Oral Daily  . tacrolimus  1.5 mg Oral BID  . vancomycin  750 mg Intravenous Q M,W,F-HD    Time  spent: >30 minutes   Verneita Griffes, MD [facility] [phone]

## 2013-07-29 DIAGNOSIS — M7989 Other specified soft tissue disorders: Secondary | ICD-10-CM

## 2013-07-29 LAB — CBC
HCT: 35.3 % — ABNORMAL LOW (ref 39.0–52.0)
Hemoglobin: 11.8 g/dL — ABNORMAL LOW (ref 13.0–17.0)
MCH: 35 pg — ABNORMAL HIGH (ref 26.0–34.0)
MCHC: 33.4 g/dL (ref 30.0–36.0)
MCV: 104.7 fL — ABNORMAL HIGH (ref 78.0–100.0)
Platelets: 141 10*3/uL — ABNORMAL LOW (ref 150–400)
RBC: 3.37 MIL/uL — AB (ref 4.22–5.81)
RDW: 14.9 % (ref 11.5–15.5)
WBC: 7 10*3/uL (ref 4.0–10.5)

## 2013-07-29 LAB — GLUCOSE, CAPILLARY: GLUCOSE-CAPILLARY: 73 mg/dL (ref 70–99)

## 2013-07-29 MED ORDER — CARVEDILOL 3.125 MG PO TABS: 25.0000 mg | ORAL_TABLET | Freq: Two times a day (BID) | ORAL | Status: DC

## 2013-07-29 MED ORDER — DOXYCYCLINE HYCLATE 100 MG PO TABS: 100.0000 mg | ORAL_TABLET | Freq: Two times a day (BID) | ORAL | Status: DC

## 2013-07-29 MED ORDER — INSULIN GLARGINE 100 UNIT/ML ~~LOC~~ SOLN: 10.0000 [IU] | Freq: Every morning | SUBCUTANEOUS | Status: DC

## 2013-07-29 MED ORDER — NEPRO/CARBSTEADY PO LIQD: 237.0000 mL | Freq: Two times a day (BID) | ORAL | Status: DC

## 2013-07-29 MED ORDER — METRONIDAZOLE 500 MG PO TABS: 500.0000 mg | ORAL_TABLET | Freq: Three times a day (TID) | ORAL | Status: DC

## 2013-07-29 NOTE — Discharge Summary (Signed)
Physician Discharge Summary  ZEDEKIAH KROENKE [id number] DOB: 11/18/1945 DOA: 07/24/2013  PCP: [name], MD  Admit date: 07/24/2013 Discharge date: 07/29/2013  Time spent: 40 minutes  Recommendations for Outpatient Follow-up:  1. Complete Doxycylcine and Flagyl as per below. 2. recommend OP follow up @ [facility] as had PCI 2 months ago-has h/o Cardiac transplant. 3. Follow with Dr. Meridee Score to be gettign another kidney transplant 4. Labs renal panel and CBC 1 week 5. Consider rpt ABI's and Colonoscopy as per PMD 6. Note forwarded to [facility] and PCP  Discharge Diagnoses:  Principal Problem:   HCAP (healthcare-associated pneumonia) Active Problems:   DM type 2 causing complication   ESRD on hemodialysis   Diarrhea   Generalized weakness   Discharge Condition: Good  Diet recommendation: Renal, diabetic  Filed Weights   07/28/13 0700 07/28/13 1121 07/28/13 2230  Weight: 74 kg (163 lb 2.3 oz) 71.2 kg (156 lb 15.5 oz) 72.2 kg (159 lb 2.8 oz)    History of present illness:  68 y/o ?, known ESRD 09-07-03, s/p Transplant [deceased 2007]then needing eventual M/W/F HD since 07-Sep-2006 [2/2 to cyclosporine for Heart transplant] @ [facility], DM ty 2,CAD s/p Heart transplant 07-Sep-1999 on Immunosuppressants, H/o DVT admitted with HCAP, h/o Mutiple [9] polpys c L sided Diverticulosis [Dr. Michail Sermon 03/17/12], LLE claudication vol Depletion on 07/24/13-Was recently place on PO abx 2/23 for Upper respiratory issues. Was noted to have also diarrhea on admit that eventually was found to be Cdiff +.   Antibiotics:  Aztreonam 2/28-3/4  Vancomycin 2/28-3/4  Flagyl 3/01---->08/08/13  tamiflu 3/01--->3/5  Doxycycline 07/28/13---> 08/02/13  Hospital Course:   1-SOB/cough/fever:  secondary to HCAP and influenza  -Narrowed Aztreonam and Vanc 3/4-->doxycycline BID 100 stop date 3/9 [total ABx duration was 10 days] -continue tamiflu till 3/5  -Cont pulmicort  2-C. Diff enteritis:   -continue on flagyl 500 po tid-stop date=08/08/13 MR:[id number] days] -patient on contact precautions  3-DM type 2:  -last A1C 6.4 (2/28)  -continue SSI and lantus  -was slighlty hypolycemic ?cut back lantus rito 10 U on d/c 4-ESRD: renal service contacted to continue HD while inpatient.  -will follow rec's  -reports dry weight ~74 kg  5-Generalized weakness and deconditioning: due to #1.  -PT has evaluated patient and is recommending HHPT at discharge +RW + 5" wheels  -Will follow progress and response; patient currently declining any services.  6-HLD: continue statin  7-CAD and bradycardia: status post PCI 2 months ago.  -Echo=EF 55-60%, mild concentric Hypertrophy  - troponin negative and TSH WNL  -will continue holding coreg at this moment-re-implemented on non-dialysis days for cardioprotection -HR stable but borderline low (initially experiencing bradycardia)  8-Hx of heart transplant:  -continue prograf and tacrolimus  -consider monitoring with levesl  -needs longer course of abx due to realtive immunosuppresion 9- Anemia of renal disease -received ESA this admit  Consultants:  Renal service Procedures:  See below for x-ray reports   Discharge Exam: Filed Vitals:   07/29/13 0700  BP:   Pulse:   Temp: 89 F (31.7 C)  Resp:    pleasant, EOMI, NO diarrheea, no chills or fever  General: see above  Cardiovascular: s1 s2 no m/r/g Respiratory: clear  Discharge Instructions  Discharge Orders   Future Orders Complete By Expires   Diet - low sodium heart healthy  As directed    Discharge instructions  As directed    Comments:     Complete doxycycline for Pneumonia 08/02/13 Complete Flagyl  for Cdiff diarrhea 08/08/13 Some changes have been made to your insulin-you willo nee less Lantus~10 units only.  Continue the kwik pen as is Your Coreg dosage has changed from 25 to 3.125 Good luck and god bless  You were cared for by a hospitalist during your hospital stay. If you have  any questions about your discharge medications or the care you received while you were in the hospital after you are discharged, you can call the unit and asked to speak with the hospitalist on call if the hospitalist that took care of you is not available. Once you are discharged, your primary care physician will handle any further medical issues. Please note that NO REFILLS for any discharge medications will be authorized once you are discharged, as it is imperative that you return to your primary care physician (or establish a relationship with a primary care physician if you do not have one) for your aftercare needs so that they can reassess your need for medications and monitor your lab values. If you do not have a primary care physician, you can call [phone] for a physician referral.   Increase activity slowly  As directed        Medication List    STOP taking these medications       azithromycin 250 MG tablet  Commonly known as:  ZITHROMAX      TAKE these medications       aspirin EC 81 MG tablet  Take 81 mg by mouth daily.     atorvastatin 40 MG tablet  Commonly known as:  LIPITOR  Take 40 mg by mouth daily.     azaTHIOprine 50 MG tablet  Commonly known as:  IMURAN  Take 75 mg by mouth daily.     calcium acetate 667 MG capsule  Commonly known as:  PHOSLO  Take 667 mg by mouth 3 (three) times daily with meals.     carvedilol 3.125 MG tablet  Commonly known as:  COREG  Take 8 tablets (25 mg total) by mouth 2 (two) times daily with a meal. 50 mg on Tues Thurs Sat and Sun.  Take 25 mg on Mon Wed and Fri.     colesevelam 625 MG tablet  Commonly known as:  WELCHOL  Take 3,750 mg by mouth daily. Takes 6 tabs daily     doxycycline 100 MG tablet  Commonly known as:  VIBRA-TABS  Take 1 tablet (100 mg total) by mouth every 12 (twelve) hours.     feeding supplement (NEPRO CARB STEADY) Liqd  Take 237 mLs by mouth 2 (two) times daily between meals.     HUMALOG KWIKPEN 100  UNIT/ML KiwkPen  Generic drug:  insulin lispro  Inject 2-9 Units into the skin 3 (three) times daily. Per sliding scale     insulin glargine 100 UNIT/ML injection  Commonly known as:  LANTUS  Inject 0.1 mLs (10 Units total) into the skin every morning.     isosorbide dinitrate 40 MG tablet  Commonly known as:  ISORDIL  Take 40 mg by mouth 3 (three) times daily.     metroNIDAZOLE 500 MG tablet  Commonly known as:  FLAGYL  Take 1 tablet (500 mg total) by mouth every 8 (eight) hours.     predniSONE 10 MG tablet  Commonly known as:  DELTASONE  Take 10 mg by mouth daily.     tacrolimus 1 MG capsule  Commonly known as:  PROGRAF  Take 1.5 mg by mouth 2 (  two) times daily.       Allergies  Allergen Reactions  . Lisinopril Swelling    Lips and tongue swell  . Norvasc [Amlodipine Besylate] Rash    Flushing  . Penicillins Rash       Follow-up Information   Follow up with [name],[name], MD In 1 week. ([facility] OP follow up in ~ 1 week)    Specialty:  Family Medicine   Contact information:   [place] [address] [phone]       Follow up with Sherril Croon, MD. (see him soon)    Specialty:  Nephrology   Contact information:   [place] [address] [phone]        The results of significant diagnostics from this hospitalization (including imaging, microbiology, ancillary and laboratory) are listed below for reference.    Significant Diagnostic Studies: Dg Chest 2 View  07/28/2013   CLINICAL DATA:  Pneumonia, followup  EXAM: CHEST  2 VIEW  COMPARISON:  Chest x-ray of 07/24/2013  FINDINGS: There are still prominent markings particularly at the left lung base suspicious for pneumonia possibly involving the left lower lobe and/or lingula. No definite effusion is seen. Mediastinal contours are stable. 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 No acute bony abnormality is seen.  IMPRESSION:  Prominent markings remain at the lung bases, left greater than right, suspicious for pneumonia. Recommend continued followup chest x-ray.   Electronically Signed   By: Ivar Drape M.D.   On: 07/28/2013 17:17   Dg Chest 2 View  07/24/2013   CLINICAL DATA:  Diarrhea for 3 days.  EXAM: CHEST  2 VIEW  COMPARISON:  None.  FINDINGS: The lungs are hyperexpanded, with flattening of the hemidiaphragms, compatible with COPD. Bibasilar airspace opacification may reflect atelectasis or pneumonia. No pleural effusion or pneumothorax is seen.  The heart is borderline normal in size. The patient is status post median sternotomy. There are fractures of the three superiormost sternal wires. No acute osseous abnormalities are seen.  IMPRESSION: Findings of COPD. Bibasilar airspace opacification may reflect atelectasis or pneumonia.   Electronically Signed   By: Garald Balding M.D.   On: 07/24/2013 05:44    Microbiology: Recent Results (from the past 240 hour(s))  CULTURE, BLOOD (ROUTINE X 2)     Status: None   Collection Time    07/24/13  8:40 AM      Result Value Ref Range Status   Specimen Description BLOOD LEFT ARM   Final   Special Requests BOTTLES DRAWN AEROBIC AND ANAEROBIC 10CC   Final   Culture  Setup Time     Final   Value: 07/24/2013 16:43     Performed at [facility]   Culture     Final   Value:        BLOOD CULTURE RECEIVED NO GROWTH TO DATE CULTURE WILL BE HELD FOR 5 DAYS BEFORE ISSUING A FINAL NEGATIVE REPORT     Performed at [facility]   Report Status PENDING   Incomplete  CULTURE, EXPECTORATED SPUTUM-ASSESSMENT     Status: None   Collection Time    07/24/13  3:38 PM      Result Value Ref Range Status   Specimen Description SPUTUM   Final   Special Requests NONE   Final   Sputum evaluation     Final   Value: THIS SPECIMEN IS ACCEPTABLE. RESPIRATORY CULTURE REPORT TO FOLLOW.  Report Status 07/24/2013 FINAL   Final  CULTURE, RESPIRATORY (NON-EXPECTORATED)     Status: None    Collection Time    07/24/13  3:38 PM      Result Value Ref Range Status   Specimen Description SPUTUM   Final   Special Requests NONE   Final   Gram Stain     Final   Value: ABUNDANT WBC PRESENT, PREDOMINANTLY PMN     FEW SQUAMOUS EPITHELIAL CELLS PRESENT     ABUNDANT GRAM POSITIVE RODS     FEW GRAM POSITIVE COCCI IN PAIRS     IN CHAINS FEW GRAM NEGATIVE RODS   Culture     Final   Value: NORMAL OROPHARYNGEAL FLORA     Performed at [facility]   Report Status 07/26/2013 FINAL   Final  CULTURE, BLOOD (ROUTINE X 2)     Status: None   Collection Time    07/24/13  5:00 PM      Result Value Ref Range Status   Specimen Description BLOOD LEFT HAND   Final   Special Requests BOTTLES DRAWN AEROBIC AND ANAEROBIC 10CC   Final   Culture  Setup Time     Final   Value: 07/24/2013 21:30     Performed at [facility]   Culture     Final   Value:        BLOOD CULTURE RECEIVED NO GROWTH TO DATE CULTURE WILL BE HELD FOR 5 DAYS BEFORE ISSUING A FINAL NEGATIVE REPORT     Performed at [facility]   Report Status PENDING   Incomplete  CLOSTRIDIUM DIFFICILE BY PCR     Status: Abnormal   Collection Time    07/24/13  6:56 PM      Result Value Ref Range Status   C difficile by pcr POSITIVE (*) NEGATIVE Final   Comment: CRITICAL RESULT CALLED TO, READ BACK BY AND VERIFIED WITH:     A LEONAR,RN 1105 07/25/13 SCALES H  MRSA PCR SCREENING     Status: None   Collection Time    07/24/13  8:25 PM      Result Value Ref Range Status   MRSA by PCR NEGATIVE  NEGATIVE Final   Comment:            The GeneXpert MRSA Assay (FDA     approved for NASAL specimens     only), is one component of a     comprehensive MRSA colonization     surveillance program. It is not     intended to diagnose MRSA     infection nor to guide or     monitor treatment for     MRSA infections.     Labs: Basic Metabolic Panel:  Recent Labs Lab 07/24/13 0830 07/24/13 1700 07/25/13 0636 07/26/13 0827  07/28/13 0730  NA 145  --  140 136* 140  K 3.7  --  4.4 3.7 4.5  CL 98  --  96 94* 98  CO2 34*  --  27 25 24   GLUCOSE 102*  --  73 69* 63*  BUN 18  --  29* 50* 50*  CREATININE 4.64* 5.32* 6.41* 8.22* 7.99*  CALCIUM 9.0  --  8.6 8.8 8.5  PHOS  --   --   --  4.6 3.6   Liver Function Tests:  Recent Labs Lab 07/26/13 0827 07/28/13 0730  ALBUMIN 2.3* 2.3*   No results found for this basename: LIPASE, AMYLASE,  in  the last 168 hours No results found for this basename: AMMONIA,  in the last 168 hours CBC:  Recent Labs Lab 07/24/13 0830 07/24/13 1700 07/25/13 0636 07/26/13 0827 07/28/13 0730  WBC 5.7 4.6 5.0 4.5 4.3  NEUTROABS 4.1  --   --   --   --   HGB 11.2* 11.2* 11.1* 10.4* 12.1*  HCT 35.4* 34.5* 34.9* 31.6* 36.6*  MCV 108.9* 108.2* 106.7* 103.6* 103.7*  PLT 64* 57* 69* 92* 95*   Cardiac Enzymes:  Recent Labs Lab 07/25/13 1030 07/25/13 1350 07/25/13 2005  TROPONINI <0.30 <0.30 <0.30   BNP: BNP (last 3 results) No results found for this basename: PROBNP,  in the last 8760 hours CBG:  Recent Labs Lab 07/28/13 1150 07/28/13 1219 07/28/13 1732 07/28/13 2222 07/29/13 0814  GLUCAP 58* 75 146* 155* 73       Signed:  Yan Okray, [id number]  [facility] 07/29/2013, 9:42 AM

## 2013-07-29 NOTE — Progress Notes (Signed)
Subjective:   Feeling well, on oral antibiotics now, if afebrile will be discharged. D/C  summary in the system  Objective Filed Vitals:   07/28/13 2230 07/29/13 0526 07/29/13 0700 07/29/13 0848  BP: 96/52 110/56    Pulse: 80 74    Temp: 98.7 F (37.1 C) 98.6 F (37 C) 89 F (31.7 C)   TempSrc: Oral Oral    Resp: 18 18    Height:      Weight: 72.2 kg (159 lb 2.8 oz)     SpO2: 97% 98%  96%   Physical Exam General: Alert and oriented. No acute distress.  Heart: RRR, no murmur Lungs: CTA, unlabored Abdomen: soft nontender +BS Extremities:No LE edema Dialysis Access: R AVF +bruit/thrill  Dialysis Orders: MWF @ [facility]  4 hr 74kg 2K/2.25Ca 3000Heparin 400/1.5 R AVF  hectorol 78mcg IV/HD Epogen 3600 Units IV/HD Venofer 50/wk  Assessment/Plan:  1. SOB- work up per primary- was given zpak 2/23- failed treatment. chest xray- PNA;  Influenza A+ tamilfu. WBC 4.5. Afebrile. Blood cultures NGTD-now on po doxy 2. Diarrhea- Cdiff +,flagyl- per primary 3. ESRD - MWF @[facility]  K+4.5 HD tomorrow at [place]- will need dec edw at [facility] 4. Hypertension/volume - 110/56, Coreg restarted last night 5. Anemia - hgb 12.1 watch CBC, received ESA, hold iron (last t sat 49)  6. Metabolic bone disease - Ca+ 8.5, cont hectorol. Phos 4.6 phoslo with meals. PTH 149 7. Nutrition - Alb 2.3 appetite improving renal diet, multivit, will add nepro 8. DM- per primary 9. History of heart transplant in 2001- on imuran and prograf   Shelle Iron, NP [place] [phone] 07/29/2013,9:26 AM  LOS: 5 days    Patient seen and examined, agree with above note with above modifications. Pt looking good , ready for discharge- on doxy, flagyl and tamiflu.  Next dialysis tomorrow at [place] with lower EDW  Corliss Parish, MD 07/29/2013     Additional Objective Labs: Basic Metabolic Panel:  Recent Labs Lab 07/25/13 0636 07/26/13 0827 07/28/13 0730  NA 140 136* 140  K 4.4 3.7 4.5  CL 96 94* 98  CO2 27 25  24   GLUCOSE 73 69* 63*  BUN 29* 50* 50*  CREATININE 6.41* 8.22* 7.99*  CALCIUM 8.6 8.8 8.5  PHOS  --  4.6 3.6   Liver Function Tests:  Recent Labs Lab 07/26/13 0827 07/28/13 0730  ALBUMIN 2.3* 2.3*   No results found for this basename: LIPASE, AMYLASE,  in the last 168 hours CBC:  Recent Labs Lab 07/24/13 0830 07/24/13 1700 07/25/13 0636 07/26/13 0827 07/28/13 0730  WBC 5.7 4.6 5.0 4.5 4.3  NEUTROABS 4.1  --   --   --   --   HGB 11.2* 11.2* 11.1* 10.4* 12.1*  HCT 35.4* 34.5* 34.9* 31.6* 36.6*  MCV 108.9* 108.2* 106.7* 103.6* 103.7*  PLT 64* 57* 69* 92* 95*   Blood Culture    Component Value Date/Time   SDES BLOOD LEFT HAND 07/24/2013 1700   SPECREQUEST BOTTLES DRAWN AEROBIC AND ANAEROBIC 10CC 07/24/2013 1700   CULT  Value:        BLOOD CULTURE RECEIVED NO GROWTH TO DATE CULTURE WILL BE HELD FOR 5 DAYS BEFORE ISSUING A FINAL NEGATIVE REPORT Performed at [place] 07/24/2013 1700   REPTSTATUS PENDING 07/24/2013 1700    Cardiac Enzymes:  Recent Labs Lab 07/25/13 1030 07/25/13 1350 07/25/13 2005  TROPONINI <0.30 <0.30 <0.30   CBG:  Recent Labs Lab 07/28/13 1150 07/28/13 1219  07/28/13 1732 07/28/13 2222 07/29/13 0814  GLUCAP 58* 75 146* 155* 73   Iron Studies: No results found for this basename: IRON, TIBC, TRANSFERRIN, FERRITIN,  in the last 72 hours @lablastinr3 @ Studies/Results: Dg Chest 2 View  07/28/2013   CLINICAL DATA:  Pneumonia, followup  EXAM: CHEST  2 VIEW  COMPARISON:  Chest x-ray of 07/24/2013  FINDINGS: There are still prominent markings particularly at the left lung base suspicious for pneumonia possibly involving the left lower lobe and/or lingula. No definite effusion is seen. Mediastinal contours are stable. 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 No acute bony abnormality is seen.  IMPRESSION: Prominent markings remain at the lung bases, left greater than right, suspicious for pneumonia.  Recommend continued followup chest x-ray.   Electronically Signed   By: Ivar Drape M.D.   On: 07/28/2013 17:17   Medications:   . aspirin EC  81 mg Oral Daily  . atorvastatin  40 mg Oral Daily  . azaTHIOprine  75 mg Oral Daily  . budesonide (PULMICORT) nebulizer solution  0.25 mg Nebulization BID  . calcium acetate  667 mg Oral TID WC  . carvedilol  3.125 mg Oral BID WC  . doxycycline  100 mg Oral Q12H  . enoxaparin (LOVENOX) injection  30 mg Subcutaneous Q24H  . feeding supplement (NEPRO CARB STEADY)  237 mL Oral BID BM  . guaiFENesin  600 mg Oral BID  . insulin aspart  0-9 Units Subcutaneous TID WC  . insulin glargine  10 Units Subcutaneous Daily  . ipratropium-albuterol  3 mL Nebulization BID  . metroNIDAZOLE  500 mg Oral 3 times per day  . multivitamin  1 tablet Oral QHS  . oseltamivir  30 mg Oral Q M,W,F-HD  . predniSONE  10 mg Oral Daily  . saccharomyces boulardii  250 mg Oral Daily  . tacrolimus  1.5 mg Oral BID

## 2013-07-29 NOTE — Care Management Note (Signed)
   CARE MANAGEMENT NOTE 07/29/2013  Patient:  Brandon Sharp, Brandon Sharp   Account Number:  [id number]  Date Initiated:  07/27/2013  Documentation initiated by:  Lizabeth Leyden  Subjective/Objective Assessment:   admitted with increasing SOB, COPD,  +Flu  medical hx: ESRD/ HD on MWF     Action/Plan:   PT evaluation  NCM to follow for discharge needs  3/5 referral for [facility], met with pt who selected [facility], pt declined rolling walker, [facility] notified.   Anticipated DC Date:  07/29/2013   Anticipated DC Plan:  [place]         Choice offered to / List presented to:          [facility] arranged  [place].   Status of service:  Completed, signed off Medicare Important Message given?   (If response is "NO", the following Medicare IM given date fields will be blank) Date Medicare IM given:   Date Additional Medicare IM given:    Discharge Disposition:  [place]  Per UR Regulation:    If discussed at Long Length of Stay Meetings, dates discussed:    Comments:

## 2013-07-29 NOTE — Progress Notes (Signed)
VASCULAR LAB PRELIMINARY  PRELIMINARY  PRELIMINARY  PRELIMINARY  Left upper extremity venous Doppler completed.    Preliminary report:  There is no obvious evidence of DVT noted in the visualized veins of the left upper extremity.  There is superficial thrombosis noted in the left basilic vein.  Morio Widen, RVT 07/29/2013, 9:05 AM

## 2013-07-30 LAB — CULTURE, BLOOD (ROUTINE X 2)
Culture: NO GROWTH
Culture: NO GROWTH

## 2013-11-23 ENCOUNTER — Encounter (HOSPITAL_COMMUNITY): Payer: Self-pay | Admitting: Pharmacy Technician

## 2013-12-06 ENCOUNTER — Encounter (HOSPITAL_COMMUNITY): Payer: Self-pay | Admitting: *Deleted

## 2013-12-10 ENCOUNTER — Other Ambulatory Visit: Payer: Self-pay | Admitting: Gastroenterology

## 2013-12-13 NOTE — Addendum Note (Signed)
Addended by: Wilford Corner on: 12/13/2013 06:43 AM   Modules accepted: Orders

## 2013-12-14 ENCOUNTER — Encounter (HOSPITAL_COMMUNITY): Payer: Medicare Other | Admitting: Anesthesiology

## 2013-12-14 ENCOUNTER — Encounter (HOSPITAL_COMMUNITY): Payer: Self-pay | Admitting: *Deleted

## 2013-12-14 ENCOUNTER — Ambulatory Visit (HOSPITAL_COMMUNITY)
Admission: RE | Admit: 2013-12-14 | Discharge: 2013-12-14 | Disposition: A | Discharge status: Home / Self Care | Payer: Medicare Other | Source: Ambulatory Visit | Attending: Gastroenterology | Admitting: Gastroenterology

## 2013-12-14 ENCOUNTER — Ambulatory Visit (HOSPITAL_COMMUNITY): Payer: Medicare Other | Admitting: Anesthesiology

## 2013-12-14 ENCOUNTER — Encounter (HOSPITAL_COMMUNITY)
Admission: RE | Disposition: A | Discharge status: Home / Self Care | Payer: Self-pay | Source: Ambulatory Visit | Attending: Gastroenterology

## 2013-12-14 DIAGNOSIS — Z1211 Encounter for screening for malignant neoplasm of colon: Secondary | ICD-10-CM | POA: Insufficient documentation

## 2013-12-14 DIAGNOSIS — I4901 Ventricular fibrillation: Secondary | ICD-10-CM | POA: Insufficient documentation

## 2013-12-14 DIAGNOSIS — Z860100 Personal history of colon polyps, unspecified: Secondary | ICD-10-CM | POA: Insufficient documentation

## 2013-12-14 DIAGNOSIS — K648 Other hemorrhoids: Secondary | ICD-10-CM | POA: Diagnosis not present

## 2013-12-14 DIAGNOSIS — I2510 Atherosclerotic heart disease of native coronary artery without angina pectoris: Secondary | ICD-10-CM | POA: Insufficient documentation

## 2013-12-14 DIAGNOSIS — E119 Type 2 diabetes mellitus without complications: Secondary | ICD-10-CM | POA: Diagnosis not present

## 2013-12-14 DIAGNOSIS — Z9861 Coronary angioplasty status: Secondary | ICD-10-CM | POA: Insufficient documentation

## 2013-12-14 DIAGNOSIS — Z794 Long term (current) use of insulin: Secondary | ICD-10-CM | POA: Insufficient documentation

## 2013-12-14 DIAGNOSIS — D126 Benign neoplasm of colon, unspecified: Secondary | ICD-10-CM | POA: Insufficient documentation

## 2013-12-14 DIAGNOSIS — Z87891 Personal history of nicotine dependence: Secondary | ICD-10-CM | POA: Insufficient documentation

## 2013-12-14 DIAGNOSIS — I252 Old myocardial infarction: Secondary | ICD-10-CM | POA: Insufficient documentation

## 2013-12-14 DIAGNOSIS — Z8701 Personal history of pneumonia (recurrent): Secondary | ICD-10-CM | POA: Diagnosis not present

## 2013-12-14 DIAGNOSIS — Z79899 Other long term (current) drug therapy: Secondary | ICD-10-CM | POA: Insufficient documentation

## 2013-12-14 DIAGNOSIS — N189 Chronic kidney disease, unspecified: Secondary | ICD-10-CM | POA: Diagnosis not present

## 2013-12-14 DIAGNOSIS — Z992 Dependence on renal dialysis: Secondary | ICD-10-CM | POA: Insufficient documentation

## 2013-12-14 DIAGNOSIS — K5730 Diverticulosis of large intestine without perforation or abscess without bleeding: Secondary | ICD-10-CM | POA: Insufficient documentation

## 2013-12-14 DIAGNOSIS — I129 Hypertensive chronic kidney disease with stage 1 through stage 4 chronic kidney disease, or unspecified chronic kidney disease: Secondary | ICD-10-CM | POA: Diagnosis not present

## 2013-12-14 DIAGNOSIS — I509 Heart failure, unspecified: Secondary | ICD-10-CM | POA: Diagnosis not present

## 2013-12-14 HISTORY — PX: COLONOSCOPY WITH PROPOFOL: SHX5780

## 2013-12-14 SURGERY — COLONOSCOPY WITH PROPOFOL
Anesthesia: Monitor Anesthesia Care

## 2013-12-14 MED ORDER — LACTATED RINGERS IV SOLN: INTRAVENOUS | Status: DC

## 2013-12-14 MED ORDER — PROPOFOL 10 MG/ML IV BOLUS
INTRAVENOUS | Status: AC
  Filled 2013-12-14: qty 20

## 2013-12-14 MED ORDER — EPHEDRINE SULFATE 50 MG/ML IJ SOLN
INTRAMUSCULAR | Status: DC | PRN
  Administered 2013-12-14: 10 mg via INTRAVENOUS

## 2013-12-14 MED ORDER — EPHEDRINE SULFATE 50 MG/ML IJ SOLN
INTRAMUSCULAR | Status: AC
Start: 2013-12-14 — End: 2013-12-14
  Filled 2013-12-14: qty 1

## 2013-12-14 MED ORDER — PHENYLEPHRINE HCL 10 MG/ML IJ SOLN
INTRAMUSCULAR | Status: DC | PRN
  Administered 2013-12-14 (×7): 40 ug via INTRAVENOUS

## 2013-12-14 MED ORDER — PROPOFOL INFUSION 10 MG/ML OPTIME
INTRAVENOUS | Status: DC | PRN
  Administered 2013-12-14: 300 ug/kg/min via INTRAVENOUS

## 2013-12-14 MED ORDER — PHENYLEPHRINE 40 MCG/ML (10ML) SYRINGE FOR IV PUSH (FOR BLOOD PRESSURE SUPPORT)
PREFILLED_SYRINGE | INTRAVENOUS | Status: AC
  Filled 2013-12-14: qty 10

## 2013-12-14 MED ORDER — SODIUM CHLORIDE 0.9 % IJ SOLN
INTRAMUSCULAR | Status: AC
  Filled 2013-12-14: qty 10

## 2013-12-14 MED ORDER — SODIUM CHLORIDE 0.9 % IV SOLN
INTRAVENOUS | Status: DC | PRN
  Administered 2013-12-14: 08:00:00 via INTRAVENOUS

## 2013-12-14 MED ORDER — PROMETHAZINE HCL 25 MG/ML IJ SOLN: 6.2500 mg | INTRAMUSCULAR | Status: DC | PRN

## 2013-12-14 SURGICAL SUPPLY — 22 items

## 2013-12-14 NOTE — Interval H&P Note (Signed)
History and Physical Interval Note:  12/14/2013 8:43 AM  Brandon Sharp  has presented today for surgery, with the diagnosis of hx. of colon polyp  The various methods of treatment have been discussed with the patient and family. After consideration of risks, benefits and other options for treatment, the patient has consented to  Procedure(s): COLONOSCOPY WITH PROPOFOL (N/A) as a surgical intervention .  The patient's history has been reviewed, patient examined, no change in status, stable for surgery.  I have reviewed the patient's chart and labs.  Questions were answered to the patient's satisfaction.     Garrison C.

## 2013-12-14 NOTE — Op Note (Signed)
[facility] [place], [address]   COLONOSCOPY PROCEDURE REPORT  PATIENT: Brandon Sharp, Brandon Sharp  MR#: [id number] BIRTHDATE: 22-May-1946 , 67  yrs. old GENDER: Male ENDOSCOPIST: Wilford Corner, MD REFERRED BY: PROCEDURE DATE:  12/14/2013 PROCEDURE:   Colonoscopy with snare polypectomy ASA CLASS:   Class IV INDICATIONS:personal history of adenomatous colon polyps. MEDICATIONS: See Anesthesia Report.  DESCRIPTION OF PROCEDURE:   After the risks benefits and alternatives of the procedure were thoroughly explained, informed consent was obtained.  The Pentax Ped Colon [id number]  endoscope was introduced through the anus and advanced to the cecum, which was identified by both the appendix and ileocecal valve , limited by No adverse events experienced.   The quality of the prep was good. . The instrument was then slowly withdrawn as the colon was fully examined.     FINDINGS:  Rectal exam unremarkable.  Pediatric colonoscope inserted into the colon and advanced to the cecum, where the appendiceal orifice and ileocecal valve were identified. Multiple cecal polyps were seen that range in size from 3 mm - 1 cm and were removed with snare cautery. A portion of the cecal base was nodular and a biopsy was taken to send for histology. On careful withdrawal of the colonoscope  2 sessile polyps were noted in the ascending colon that were 1.2 cm and 1.8 cm in size and were removed with a snare cautery. The largest of these polyps was retrieved with the Jabier Mutton net. Diffuse diverticulosis was noted.  Retroflexion revealed small internal hemorrhoids.  COMPLICATIONS: None  IMPRESSION:     Multiple polyps (10 polyps) removed -see above Diffuse diverticulosis Small internal hemorrhoids  RECOMMENDATIONS: F/U on path; Avoid aspirin and other NSAIDs for 2 weeks; High fiber diet    ______________________________ eSignedWilford Corner, MD 12/14/2013 10:25  AM   CC:  PATIENT NAME:  [name], [name] MR#: [id number]

## 2013-12-14 NOTE — Anesthesia Preprocedure Evaluation (Addendum)
Anesthesia Evaluation  Patient identified by MRN, date of birth, ID band Patient awake    Reviewed: Allergy & Precautions, H&P , NPO status , Patient's Chart, lab work & pertinent test results  Airway Mallampati: II TM Distance: >3 FB Neck ROM: Full    Dental  (+) Caps, Dental Advisory Given   Pulmonary pneumonia -, former smoker,  breath sounds clear to auscultation        Cardiovascular hypertension, Pt. on home beta blockers and Pt. on medications + angina + CAD, + Past MI, + Cardiac Stents, + Peripheral Vascular Disease and +CHF + dysrhythmias Ventricular Fibrillation Rhythm:Regular Rate:Normal  Cardiac transplant   Neuro/Psych negative neurological ROS  negative psych ROS   GI/Hepatic negative GI ROS, Neg liver ROS,   Endo/Other  diabetes, Type 1, Insulin Dependent  Renal/GU Dialysis and CRFRenal disease  negative genitourinary   Musculoskeletal negative musculoskeletal ROS (+)   Abdominal   Peds negative pediatric ROS (+)  Hematology negative hematology ROS (+) anemia ,   Anesthesia Other Findings   Reproductive/Obstetrics                         Anesthesia Physical Anesthesia Plan  ASA: IV  Anesthesia Plan: MAC   Post-op Pain Management:    Induction: Intravenous  Airway Management Planned: Simple Face Mask  Additional Equipment:   Intra-op Plan:   Post-operative Plan: Extubation in OR  Informed Consent: I have reviewed the patients History and Physical, chart, labs and discussed the procedure including the risks, benefits and alternatives for the proposed anesthesia with the patient or authorized representative who has indicated his/her understanding and acceptance.   Dental advisory given  Plan Discussed with: CRNA  Anesthesia Plan Comments:         Anesthesia Quick Evaluation

## 2013-12-14 NOTE — Discharge Instructions (Addendum)
NO aspirin or ibuprofen products for 2 weeks.   Colonoscopy, Care After These instructions give you information on caring for yourself after your procedure. Your doctor may also give you more specific instructions. Call your doctor if you have any problems or questions after your procedure. HOME CARE  Do not drive for 24 hours.  Do not sign important papers or use machinery for 24 hours.  You may shower.  You may go back to your usual activities, but go slower for the first 24 hours.  Take rest breaks often during the first 24 hours.  Walk around or use warm packs on your belly (abdomen) if you have belly cramping or gas.  Drink enough fluids to keep your pee (urine) clear or pale yellow.  Resume your normal diet. Avoid heavy or fried foods.  Avoid drinking alcohol for 24 hours or as told by your doctor.  Only take medicines as told by your doctor. If a tissue sample (biopsy) was taken during the procedure:   Do not take aspirin or blood thinners for 7 days, or as told by your doctor.  Do not drink alcohol for 7 days, or as told by your doctor.  Eat soft foods for the first 24 hours. GET HELP IF: You still have a small amount of blood in your poop (stool) 2-3 days after the procedure. GET HELP RIGHT AWAY IF:  You have more than a small amount of blood in your poop.  You see clumps of tissue (blood clots) in your poop.  Your belly is puffy (swollen).  You feel sick to your stomach (nauseous) or throw up (vomit).  You have a fever.  You have belly pain that gets worse and medicine does not help. MAKE SURE YOU:  Understand these instructions.  Will watch your condition.  Will get help right away if you are not doing well or get worse. Document Released: 06/15/2010 Document Revised: 05/18/2013 Document Reviewed: 01/18/2013 [facility] Patient Information 2015 [place], [place]. This information is not intended to replace advice given to you by your health care provider.  Make sure you discuss any questions you have with your health care provider.   Monitored Anesthesia Care  Monitored anesthesia care is an anesthesia service for a medical procedure. Anesthesia is the loss of the ability to feel pain. It is produced by medications called anesthetics. It may affect a small area of your body (local anesthesia), a large area of your body (regional anesthesia), or your entire body (general anesthesia). The need for monitored anesthesia care depends your procedure, your condition, and the potential need for regional or general anesthesia. It is often provided during procedures where:   General anesthesia may be needed if there are complications. This is because you need special care when you are under general anesthesia.   You will be under local or regional anesthesia. This is so that you are able to have higher levels of anesthesia if needed.   You will receive calming medications (sedatives). This is especially the case if sedatives are given to put you in a semi-conscious state of relaxation (deep sedation). This is because the amount of sedative needed to produce this state can be hard to predict. Too much of a sedative can produce general anesthesia. Monitored anesthesia care is performed by one or more caregivers who have special training in all types of anesthesia. You will need to meet with these caregivers before your procedure. During this meeting, they will ask you about your medical history.  They will also give you instructions to follow. (For example, you will need to stop eating and drinking before your procedure. You may also need to stop or change medications you are taking.) During your procedure, your caregivers will stay with you. They will:   Watch your condition. This includes watching you blood pressure, breathing, and level of pain.   Diagnose and treat problems that occur.   Give medications if they are needed. These may include calming  medications (sedatives) and anesthetics.   Make sure you are comfortable.  Having monitored anesthesia care does not necessarily mean that you will be under anesthesia. It does mean that your caregivers will be able to manage anesthesia if you need it or if it occurs. It also means that you will be able to have a different type of anesthesia than you are having if you need it. When your procedure is complete, your caregivers will continue to watch your condition. They will make sure any medications wear off before you are allowed to go home.  Document Released: 02/06/2005 Document Revised: 09/07/2012 Document Reviewed: 06/24/2012 [facility] Patient Information 2015 [place], [place]. This information is not intended to replace advice given to you by your health care provider. Make sure you discuss any questions you have with your health care provider.

## 2013-12-14 NOTE — Anesthesia Postprocedure Evaluation (Signed)
Anesthesia Post Note  Patient: Brandon Sharp  Procedure(s) Performed: Procedure(s) (LRB): COLONOSCOPY WITH PROPOFOL (N/A)  Anesthesia type: MAC  Patient location: PACU  Post pain: Pain level controlled  Post assessment: Post-op Vital signs reviewed  Last Vitals:  Filed Vitals:   12/14/13 1034  BP: 129/47  Pulse:   Temp:   Resp: 97    Post vital signs: Reviewed  Level of consciousness: sedated  Complications: No apparent anesthesia complications

## 2013-12-14 NOTE — H&P (Signed)
  Date of Initial H&P: 12/07/13  History reviewed, patient examined, no change in status, stable for surgery.

## 2013-12-14 NOTE — Transfer of Care (Signed)
Immediate Anesthesia Transfer of Care Note  Patient: Brandon Sharp  Procedure(s) Performed: Procedure(s): COLONOSCOPY WITH PROPOFOL (N/A)  Patient Location: PACU and Endoscopy Unit  Anesthesia Type:MAC  Level of Consciousness: awake and patient cooperative  Airway & Oxygen Therapy: Patient Spontanous Breathing and Patient connected to face mask oxygen  Post-op Assessment: Report given to PACU RN and Post -op Vital signs reviewed and stable  Post vital signs: Reviewed and stable  Complications: No apparent anesthesia complications

## 2013-12-15 ENCOUNTER — Encounter (HOSPITAL_COMMUNITY): Payer: Self-pay | Admitting: Gastroenterology

## 2013-12-15 LAB — POCT I-STAT 4, (NA,K, GLUC, HGB,HCT)
Glucose, Bld: 76 mg/dL (ref 70–99)
HCT: 33 % — ABNORMAL LOW (ref 39.0–52.0)
HEMOGLOBIN: 11.2 g/dL — AB (ref 13.0–17.0)
POTASSIUM: 4.5 meq/L (ref 3.7–5.3)
Sodium: 138 mEq/L (ref 137–147)

## 2014-03-27 ENCOUNTER — Inpatient Hospital Stay (HOSPITAL_COMMUNITY)
Admission: EM | Admit: 2014-03-27 | Discharge: 2014-03-31 | DRG: 871 | Disposition: A | Discharge status: Home / Self Care | Payer: Medicare Other | Attending: Internal Medicine | Admitting: Internal Medicine

## 2014-03-27 ENCOUNTER — Encounter (HOSPITAL_COMMUNITY): Payer: Self-pay

## 2014-03-27 ENCOUNTER — Emergency Department (HOSPITAL_COMMUNITY): Payer: Medicare Other

## 2014-03-27 DIAGNOSIS — Z86718 Personal history of other venous thrombosis and embolism: Secondary | ICD-10-CM | POA: Diagnosis not present

## 2014-03-27 DIAGNOSIS — A419 Sepsis, unspecified organism: Secondary | ICD-10-CM | POA: Diagnosis present

## 2014-03-27 DIAGNOSIS — Z79899 Other long term (current) drug therapy: Secondary | ICD-10-CM

## 2014-03-27 DIAGNOSIS — D6959 Other secondary thrombocytopenia: Secondary | ICD-10-CM | POA: Diagnosis present

## 2014-03-27 DIAGNOSIS — N2581 Secondary hyperparathyroidism of renal origin: Secondary | ICD-10-CM | POA: Diagnosis present

## 2014-03-27 DIAGNOSIS — R509 Fever, unspecified: Secondary | ICD-10-CM | POA: Diagnosis present

## 2014-03-27 DIAGNOSIS — I252 Old myocardial infarction: Secondary | ICD-10-CM

## 2014-03-27 DIAGNOSIS — T451X5A Adverse effect of antineoplastic and immunosuppressive drugs, initial encounter: Secondary | ICD-10-CM | POA: Diagnosis present

## 2014-03-27 DIAGNOSIS — Z941 Heart transplant status: Secondary | ICD-10-CM

## 2014-03-27 DIAGNOSIS — E118 Type 2 diabetes mellitus with unspecified complications: Secondary | ICD-10-CM

## 2014-03-27 DIAGNOSIS — Z992 Dependence on renal dialysis: Secondary | ICD-10-CM | POA: Diagnosis not present

## 2014-03-27 DIAGNOSIS — I120 Hypertensive chronic kidney disease with stage 5 chronic kidney disease or end stage renal disease: Secondary | ICD-10-CM | POA: Diagnosis present

## 2014-03-27 DIAGNOSIS — D631 Anemia in chronic kidney disease: Secondary | ICD-10-CM | POA: Diagnosis present

## 2014-03-27 DIAGNOSIS — J189 Pneumonia, unspecified organism: Secondary | ICD-10-CM | POA: Diagnosis present

## 2014-03-27 DIAGNOSIS — Z794 Long term (current) use of insulin: Secondary | ICD-10-CM | POA: Diagnosis not present

## 2014-03-27 DIAGNOSIS — J181 Lobar pneumonia, unspecified organism: Secondary | ICD-10-CM

## 2014-03-27 DIAGNOSIS — N184 Chronic kidney disease, stage 4 (severe): Secondary | ICD-10-CM

## 2014-03-27 DIAGNOSIS — N186 End stage renal disease: Secondary | ICD-10-CM | POA: Diagnosis present

## 2014-03-27 DIAGNOSIS — E119 Type 2 diabetes mellitus without complications: Secondary | ICD-10-CM | POA: Diagnosis present

## 2014-03-27 DIAGNOSIS — T8612 Kidney transplant failure: Secondary | ICD-10-CM

## 2014-03-27 DIAGNOSIS — T8611 Kidney transplant rejection: Secondary | ICD-10-CM

## 2014-03-27 DIAGNOSIS — N185 Chronic kidney disease, stage 5: Secondary | ICD-10-CM

## 2014-03-27 DIAGNOSIS — D689 Coagulation defect, unspecified: Secondary | ICD-10-CM

## 2014-03-27 DIAGNOSIS — R531 Weakness: Secondary | ICD-10-CM

## 2014-03-27 DIAGNOSIS — I4901 Ventricular fibrillation: Secondary | ICD-10-CM

## 2014-03-27 DIAGNOSIS — I10 Essential (primary) hypertension: Secondary | ICD-10-CM

## 2014-03-27 DIAGNOSIS — E785 Hyperlipidemia, unspecified: Secondary | ICD-10-CM | POA: Diagnosis present

## 2014-03-27 DIAGNOSIS — Z87891 Personal history of nicotine dependence: Secondary | ICD-10-CM

## 2014-03-27 DIAGNOSIS — N19 Unspecified kidney failure: Secondary | ICD-10-CM

## 2014-03-27 DIAGNOSIS — Z7952 Long term (current) use of systemic steroids: Secondary | ICD-10-CM

## 2014-03-27 DIAGNOSIS — R197 Diarrhea, unspecified: Secondary | ICD-10-CM

## 2014-03-27 DIAGNOSIS — Z951 Presence of aortocoronary bypass graft: Secondary | ICD-10-CM

## 2014-03-27 DIAGNOSIS — Z7982 Long term (current) use of aspirin: Secondary | ICD-10-CM

## 2014-03-27 LAB — I-STAT TROPONIN, ED: Troponin i, poc: 0.1 ng/mL (ref 0.00–0.08)

## 2014-03-27 LAB — BASIC METABOLIC PANEL
Anion gap: 19 — ABNORMAL HIGH (ref 5–15)
BUN: 39 mg/dL — AB (ref 6–23)
CHLORIDE: 94 meq/L — AB (ref 96–112)
CO2: 25 meq/L (ref 19–32)
CREATININE: 8.5 mg/dL — AB (ref 0.50–1.35)
Calcium: 9.4 mg/dL (ref 8.4–10.5)
GFR calc Af Amer: 7 mL/min — ABNORMAL LOW (ref >90)
GFR calc non Af Amer: 6 mL/min — ABNORMAL LOW (ref >90)
GLUCOSE: 116 mg/dL — AB (ref 70–99)
Potassium: 3.8 mEq/L (ref 3.7–5.3)
Sodium: 138 mEq/L (ref 137–147)

## 2014-03-27 LAB — I-STAT CHEM 8, ED
BUN: 38 mg/dL — ABNORMAL HIGH (ref 6–23)
Calcium, Ion: 1.07 mmol/L — ABNORMAL LOW (ref 1.13–1.30)
Chloride: 98 mEq/L (ref 96–112)
Creatinine, Ser: 9.2 mg/dL — ABNORMAL HIGH (ref 0.50–1.35)
GLUCOSE: 115 mg/dL — AB (ref 70–99)
HEMATOCRIT: 28 % — AB (ref 39.0–52.0)
HEMOGLOBIN: 9.5 g/dL — AB (ref 13.0–17.0)
POTASSIUM: 3.6 meq/L — AB (ref 3.7–5.3)
Sodium: 134 mEq/L — ABNORMAL LOW (ref 137–147)
TCO2: 26 mmol/L (ref 0–100)

## 2014-03-27 LAB — PRO B NATRIURETIC PEPTIDE: PRO B NATRI PEPTIDE: 37853 pg/mL — AB (ref 0–125)

## 2014-03-27 LAB — HEPATIC FUNCTION PANEL
ALT: 10 U/L (ref 0–53)
AST: 23 U/L (ref 0–37)
Albumin: 2.8 g/dL — ABNORMAL LOW (ref 3.5–5.2)
Alkaline Phosphatase: 54 U/L (ref 39–117)
BILIRUBIN TOTAL: 0.3 mg/dL (ref 0.3–1.2)
Bilirubin, Direct: <0.2 mg/dL (ref 0.0–0.3)
Total Protein: 6.4 g/dL (ref 6.0–8.3)

## 2014-03-27 LAB — CBC
HEMATOCRIT: 28.1 % — AB (ref 39.0–52.0)
HEMOGLOBIN: 9.2 g/dL — AB (ref 13.0–17.0)
MCH: 32.4 pg (ref 26.0–34.0)
MCHC: 32.7 g/dL (ref 30.0–36.0)
MCV: 98.9 fL (ref 78.0–100.0)
Platelets: 121 10*3/uL — ABNORMAL LOW (ref 150–400)
RBC: 2.84 MIL/uL — ABNORMAL LOW (ref 4.22–5.81)
RDW: 14.6 % (ref 11.5–15.5)
WBC: 6.7 10*3/uL (ref 4.0–10.5)

## 2014-03-27 LAB — PROTIME-INR
INR: 1.01 (ref 0.00–1.49)
Prothrombin Time: 13.4 seconds (ref 11.6–15.2)

## 2014-03-27 LAB — DIFFERENTIAL
BASOS PCT: 0 % (ref 0–1)
Basophils Absolute: 0 10*3/uL (ref 0.0–0.1)
Eosinophils Absolute: 0 10*3/uL (ref 0.0–0.7)
Eosinophils Relative: 0 % (ref 0–5)
Lymphocytes Relative: 21 % (ref 12–46)
Lymphs Abs: 1.4 10*3/uL (ref 0.7–4.0)
MONO ABS: 0.5 10*3/uL (ref 0.1–1.0)
Monocytes Relative: 7 % (ref 3–12)
Neutro Abs: 4.8 10*3/uL (ref 1.7–7.7)
Neutrophils Relative %: 71 % (ref 43–77)

## 2014-03-27 LAB — TROPONIN I: Troponin I: <0.3 ng/mL (ref <0.30)

## 2014-03-27 LAB — GLUCOSE, CAPILLARY: GLUCOSE-CAPILLARY: 204 mg/dL — AB (ref 70–99)

## 2014-03-27 LAB — I-STAT CG4 LACTIC ACID, ED: Lactic Acid, Venous: 2.06 mmol/L (ref 0.5–2.2)

## 2014-03-27 MED ORDER — TACROLIMUS 1 MG PO CAPS
1.5000 mg | ORAL_CAPSULE | Freq: Two times a day (BID) | ORAL | Status: DC
  Filled 2014-03-27 (×3): qty 1

## 2014-03-27 MED ORDER — SIROLIMUS 1 MG PO TABS
2.0000 mg | ORAL_TABLET | Freq: Every day | ORAL | Status: DC
  Administered 2014-03-28 – 2014-03-31 (×4): 2 mg via ORAL
  Filled 2014-03-27 (×4): qty 2

## 2014-03-27 MED ORDER — INSULIN ASPART 100 UNIT/ML ~~LOC~~ SOLN: 0.0000 [IU] | Freq: Every day | SUBCUTANEOUS | Status: DC

## 2014-03-27 MED ORDER — CARVEDILOL 25 MG PO TABS
25.0000 mg | ORAL_TABLET | Freq: Every day | ORAL | Status: DC
  Administered 2014-03-27 – 2014-03-29 (×3): 25 mg via ORAL
  Filled 2014-03-27 (×4): qty 1

## 2014-03-27 MED ORDER — SODIUM CHLORIDE 0.9 % IV SOLN: 250.0000 mL | INTRAVENOUS | Status: DC | PRN

## 2014-03-27 MED ORDER — INSULIN GLARGINE 100 UNIT/ML ~~LOC~~ SOLN
20.0000 [IU] | Freq: Every morning | SUBCUTANEOUS | Status: DC
  Administered 2014-03-28 – 2014-03-31 (×4): 20 [IU] via SUBCUTANEOUS
  Filled 2014-03-27 (×4): qty 0.2

## 2014-03-27 MED ORDER — INSULIN ASPART 100 UNIT/ML ~~LOC~~ SOLN
0.0000 [IU] | Freq: Three times a day (TID) | SUBCUTANEOUS | Status: DC
  Administered 2014-03-28 (×2): 1 [IU] via SUBCUTANEOUS
  Administered 2014-03-29: 3 [IU] via SUBCUTANEOUS
  Administered 2014-03-29: 2 [IU] via SUBCUTANEOUS
  Administered 2014-03-31: 3 [IU] via SUBCUTANEOUS

## 2014-03-27 MED ORDER — ALBUTEROL SULFATE (2.5 MG/3ML) 0.083% IN NEBU
2.5000 mg | INHALATION_SOLUTION | Freq: Four times a day (QID) | RESPIRATORY_TRACT | Status: DC
Start: 2014-03-27 — End: 2014-03-28
  Administered 2014-03-27: 2.5 mg via RESPIRATORY_TRACT
  Filled 2014-03-27: qty 3

## 2014-03-27 MED ORDER — ASPIRIN EC 81 MG PO TBEC
81.0000 mg | DELAYED_RELEASE_TABLET | Freq: Every day | ORAL | Status: DC
  Administered 2014-03-28 – 2014-03-31 (×4): 81 mg via ORAL
  Filled 2014-03-27 (×4): qty 1

## 2014-03-27 MED ORDER — MORPHINE SULFATE 2 MG/ML IJ SOLN: 2.0000 mg | INTRAMUSCULAR | Status: DC | PRN

## 2014-03-27 MED ORDER — CALCIUM ACETATE 667 MG PO CAPS
667.0000 mg | ORAL_CAPSULE | Freq: Three times a day (TID) | ORAL | Status: DC
  Administered 2014-03-28: 667 mg via ORAL
  Filled 2014-03-27 (×4): qty 1

## 2014-03-27 MED ORDER — VANCOMYCIN HCL IN DEXTROSE 750-5 MG/150ML-% IV SOLN
750.0000 mg | INTRAVENOUS | Status: DC
  Administered 2014-03-28 – 2014-03-30 (×2): 750 mg via INTRAVENOUS
  Filled 2014-03-27 (×3): qty 150

## 2014-03-27 MED ORDER — ISOSORBIDE DINITRATE 20 MG PO TABS
40.0000 mg | ORAL_TABLET | Freq: Three times a day (TID) | ORAL | Status: DC
  Administered 2014-03-27 – 2014-03-30 (×8): 40 mg via ORAL
  Filled 2014-03-27 (×11): qty 2

## 2014-03-27 MED ORDER — COLESEVELAM HCL 625 MG PO TABS
3750.0000 mg | ORAL_TABLET | Freq: Every day | ORAL | Status: DC
  Administered 2014-03-28 – 2014-03-31 (×4): 3750 mg via ORAL
  Filled 2014-03-27 (×4): qty 6

## 2014-03-27 MED ORDER — DEXTROSE 5 % IV SOLN
2.0000 g | Freq: Once | INTRAVENOUS | Status: AC
  Administered 2014-03-27: 2 g via INTRAVENOUS
  Filled 2014-03-27: qty 2

## 2014-03-27 MED ORDER — HEPARIN SODIUM (PORCINE) 5000 UNIT/ML IJ SOLN
5000.0000 [IU] | Freq: Three times a day (TID) | INTRAMUSCULAR | Status: DC
  Administered 2014-03-27 – 2014-03-30 (×7): 5000 [IU] via SUBCUTANEOUS
  Filled 2014-03-27 (×13): qty 1

## 2014-03-27 MED ORDER — ACETAMINOPHEN 325 MG PO TABS
650.0000 mg | ORAL_TABLET | Freq: Once | ORAL | Status: AC
  Administered 2014-03-27: 650 mg via ORAL
  Filled 2014-03-27: qty 2

## 2014-03-27 MED ORDER — ALBUTEROL SULFATE (2.5 MG/3ML) 0.083% IN NEBU: 2.5000 mg | INHALATION_SOLUTION | RESPIRATORY_TRACT | Status: DC | PRN

## 2014-03-27 MED ORDER — ACETAMINOPHEN 325 MG PO TABS
650.0000 mg | ORAL_TABLET | Freq: Four times a day (QID) | ORAL | Status: DC | PRN
  Administered 2014-03-30: 650 mg via ORAL
  Filled 2014-03-27: qty 2

## 2014-03-27 MED ORDER — SODIUM CHLORIDE 0.9 % IV BOLUS (SEPSIS)
250.0000 mL | Freq: Once | INTRAVENOUS | Status: AC
  Administered 2014-03-27: 250 mL via INTRAVENOUS

## 2014-03-27 MED ORDER — SODIUM CHLORIDE 0.9 % IJ SOLN: 3.0000 mL | INTRAMUSCULAR | Status: DC | PRN

## 2014-03-27 MED ORDER — CEFEPIME HCL 2 G IJ SOLR
2.0000 g | INTRAMUSCULAR | Status: DC
  Filled 2014-03-27 (×2): qty 2

## 2014-03-27 MED ORDER — HYDROCODONE-ACETAMINOPHEN 5-325 MG PO TABS: 1.0000 | ORAL_TABLET | ORAL | Status: DC | PRN

## 2014-03-27 MED ORDER — TACROLIMUS 1 MG PO CAPS: 1.5000 mg | ORAL_CAPSULE | Freq: Two times a day (BID) | ORAL | Status: DC

## 2014-03-27 MED ORDER — AZATHIOPRINE 50 MG PO TABS
75.0000 mg | ORAL_TABLET | Freq: Every morning | ORAL | Status: DC
  Administered 2014-03-28 – 2014-03-31 (×4): 75 mg via ORAL
  Filled 2014-03-27 (×4): qty 2

## 2014-03-27 MED ORDER — ATORVASTATIN CALCIUM 40 MG PO TABS
40.0000 mg | ORAL_TABLET | Freq: Every day | ORAL | Status: DC
  Administered 2014-03-28 – 2014-03-31 (×4): 40 mg via ORAL
  Filled 2014-03-27 (×4): qty 1

## 2014-03-27 MED ORDER — SODIUM CHLORIDE 0.9 % IJ SOLN
3.0000 mL | Freq: Two times a day (BID) | INTRAMUSCULAR | Status: DC
  Administered 2014-03-28 – 2014-03-31 (×7): 3 mL via INTRAVENOUS

## 2014-03-27 MED ORDER — ACETAMINOPHEN 650 MG RE SUPP: 650.0000 mg | Freq: Four times a day (QID) | RECTAL | Status: DC | PRN

## 2014-03-27 MED ORDER — VANCOMYCIN HCL 10 G IV SOLR
1500.0000 mg | Freq: Once | INTRAVENOUS | Status: AC
  Administered 2014-03-27: 1500 mg via INTRAVENOUS
  Filled 2014-03-27: qty 1500

## 2014-03-27 MED ORDER — PREDNISONE 10 MG PO TABS
10.0000 mg | ORAL_TABLET | Freq: Every day | ORAL | Status: DC
  Administered 2014-03-28 – 2014-03-31 (×4): 10 mg via ORAL
  Filled 2014-03-27 (×5): qty 1

## 2014-03-27 MED ORDER — IPRATROPIUM BROMIDE 0.02 % IN SOLN
0.5000 mg | Freq: Four times a day (QID) | RESPIRATORY_TRACT | Status: DC
  Administered 2014-03-27: 0.5 mg via RESPIRATORY_TRACT
  Filled 2014-03-27: qty 2.5

## 2014-03-27 NOTE — ED Notes (Signed)
Report attempted 

## 2014-03-27 NOTE — ED Notes (Addendum)
Pt reports generalized weakness and fatigue x 2 weeks. States he hasn't had an appetite and "no energy to do anything." Denies any CP or SOB. Denies fever/chills or cough. Pt is AO x4. Neuro intact. VSS. Hx: heart and kidney transplant. Dialysis MWF

## 2014-03-27 NOTE — ED Provider Notes (Addendum)
TIME SEEN: 5:47 PM  CHIEF COMPLAINT: generalized weakness, fever, chills  HPI: Patient is a 68 year old male with a history of hypertension, diabetes, coronary artery disease, prior DVT, history of CHF requiring a heart transplant at [facility], kidney transplant after being on cyclosporine in 2008 in [place] with subsequent renal failure now back on hemodialysis on Monday, Wednesday and Friday with his last dialysis on Friday 2 days ago who presents emergency department with 2-3 weeks of generalized weakness, fatigue and decreased appetite. He is also had occasional vomiting and a dry cough. He noted chills today and is febrile in the emergency department. Reports his wife recently had a sinus infection but no other sick contacts or recent travel. Has not missed any of his immunosuppressants. He denies any headache currently, neck pain or neck stiffness, chest pain or shortness of breath, abdominal pain, diarrhea, rash. He has had an influenza vaccination.  ROS: See HPI Constitutional:  fever  Eyes: no drainage  ENT: no runny nose   Cardiovascular:  no chest pain  Resp: no SOB  GI: vomiting GU: no dysuria Integumentary: no rash  Allergy: no hives  Musculoskeletal: no leg swelling  Neurological: no slurred speech ROS otherwise negative  PAST MEDICAL HISTORY/PAST SURGICAL HISTORY:  Past Medical History  Diagnosis Date  . Diabetes mellitus   . Hypertension   . Myocardial infarction 1985; 1990  . Angina   . Coronary artery disease   . Dysrhythmia   . DVT (deep venous thrombosis) ~ 12/2010    LLE  . Anemia   . Blood transfusion 06/1999    post heart transplant  . Peripheral vascular disease   . CHF (congestive heart failure) 2001    beffore transplant  . Renal failure     Hemodialysis MWF last 2 years, sse dr [name] nephrology, goes to [facility]    MEDICATIONS:  Prior to Admission medications   Medication Sig Start Date End Date Taking? Authorizing  Provider  aspirin EC 81 MG tablet Take 81 mg by mouth daily.      Historical Provider, MD  atorvastatin (LIPITOR) 40 MG tablet Take 40 mg by mouth daily.      Historical Provider, MD  azaTHIOprine (IMURAN) 50 MG tablet Take 75 mg by mouth every morning.     Historical Provider, MD  calcium acetate (PHOSLO) 667 MG capsule Take 667 mg by mouth 3 (three) times daily with meals.  11/01/12   Historical Provider, MD  carvedilol (COREG) 25 MG tablet Take 25 mg by mouth at bedtime.    Historical Provider, MD  colesevelam (WELCHOL) 625 MG tablet Take 3,750 mg by mouth daily. Takes 6 tabs daily    Historical Provider, MD  HUMALOG KWIKPEN 100 UNIT/ML SOPN Inject 2-8 Units into the skin 2 (two) times daily. Per sliding scale 11/17/12   Historical Provider, MD  insulin glargine (LANTUS) 100 UNIT/ML injection Inject 20 Units into the skin every morning.    Historical Provider, MD  isosorbide dinitrate (ISORDIL) 40 MG tablet Take 40 mg by mouth 3 (three) times daily.      Historical Provider, MD  predniSONE (DELTASONE) 10 MG tablet Take 10 mg by mouth daily.      Historical Provider, MD  tacrolimus (PROGRAF) 0.5 MG capsule Take 1.5 mg by mouth 2 (two) times daily.    Historical Provider, MD  tacrolimus (PROGRAF) 1 MG capsule Take 1.5 mg by mouth 2 (two) times daily.    Historical Provider, MD  ALLERGIES:  Allergies  Allergen Reactions  . Lisinopril Swelling    Lips and tongue swell  . Norvasc [Amlodipine Besylate] Rash    Flushing  . Penicillins Rash    SOCIAL HISTORY:  History  Substance Use Topics  . Smoking status: Former Smoker -- 1.00 packs/day for 20 years    Types: Cigarettes    Quit date: 05/27/1988  . Smokeless tobacco: Never Used  . Alcohol Use: Yes     Comment: "Holidays"    FAMILY HISTORY: Family History  Problem Relation Age of Onset  . Heart disease Mother   . Hyperlipidemia Mother   . Hypertension Mother   . Hyperlipidemia Father   . Hypertension Father   . Diabetes Brother    . Heart disease Brother     Heart Disease before age 42  . Hyperlipidemia Brother   . Hypertension Brother     EXAM: BP 126/60 mmHg  Pulse 88  Temp(Src) 100.1 F (37.8 C) (Oral)  Resp 18  SpO2 95% CONSTITUTIONAL: Alert and oriented and responds appropriately to questions. Well-appearing; well-nourished HEAD: Normocephalic EYES: Conjunctivae clear, PERRL ENT: normal nose; no rhinorrhea; moist mucous membranes; pharynx without lesions noted NECK: Supple, no meningismus, no LAD  CARD: RRR; S1 and S2 appreciated; no murmurs, no clicks, no rubs, no gallops RESP: Normal chest excursion without splinting or tachypnea; breath sounds clear and equal bilaterally; no wheezes, no rhonchi, no rales,  ABD/GI: Normal bowel sounds; non-distended; soft, non-tender, no rebound, no guarding BACK:  The back appears normal and is non-tender to palpation, there is no CVA tenderness EXT: Normal ROM in all joints; non-tender to palpation; no edema; normal capillary refill; no cyanosis; dialysis graft in right upper extremity with good thrill and bruit with no erythema or warmth or tenderness    SKIN: Normal color for age and race; warm NEURO: Moves all extremities equally; sensation to light touch intact diffusely, cranial nerves II through XII intact PSYCH: The patient's mood and manner are appropriate. Grooming and personal hygiene are appropriate.  MEDICAL DECISION MAKING: Patient here with fever and intermittent tachycardia. He is otherwise hemodynamically stable and in no distress. He is on immunosuppressants and has a history of heart and kidney transplant. He is also history of dialysis with last dialysis 2 days ago. No sign of volume overload currently. He has not missed dialysis. Concern for possible sepsis. Will give broad-spectrum antibiotics and obtain cultures, chest x-ray. He no longer makes urine.  Patient will need admission. Discuss this with patient and wife are comfortable with this plan.  PCP is Dr. Anastasia Pall.  ED PROGRESS: Patient's labs show slightly elevated troponin but this is in the setting of end-stage renal disease and possible sepsis. Lactate normal. BNP is 37,000 but this is less than previous. Chest x-ray shows no edema or infiltrate.  EKG shows no significant changes.  Blood cultures pending. Concern for possible bacteremia. We'll discuss with medicine for admission.   7:57 PM  Discussed with Dr. Laren Everts for admission to medical, inpt bed.     EKG Interpretation  Date/Time:  Sunday March 27 2014 17:10:57 EST Ventricular Rate:  95 PR Interval:  162 QRS Duration: 158 QT Interval:  416 QTC Calculation: 522 R Axis:   -60 Text Interpretation:  Normal sinus rhythm Right atrial enlargement Right bundle branch block Left anterior fascicular block Left ventricular hypertrophy with repolarization abnormality Abnormal ECG Artifact Confirmed by Tanessa Tidd,  DO, Mateja Dier ([id number]) on 03/27/2014 5:47:32 PM  CRITICAL CARE Performed by: Nyra Jabs   Total critical care time: 30 minutes  Critical care time was exclusive of separately billable procedures and treating other patients.  Critical care was necessary to treat or prevent imminent or life-threatening deterioration.  Critical care was time spent personally by me on the following activities: development of treatment plan with patient and/or surrogate as well as nursing, discussions with consultants, evaluation of patient's response to treatment, examination of patient, obtaining history from patient or surrogate, ordering and performing treatments and interventions, ordering and review of laboratory studies, ordering and review of radiographic studies, pulse oximetry and re-evaluation of patient's condition.    [place], DO 03/27/14 [place], DO 03/27/14 1958

## 2014-03-27 NOTE — Progress Notes (Signed)
ANTIBIOTIC CONSULT NOTE - INITIAL  Pharmacy Consult for cefepime and vancomycin Indication: rule out sepsis  Allergies  Allergen Reactions  . Lisinopril Swelling    Lips and tongue swell  . Norvasc [Amlodipine Besylate] Rash    Flushing  . Penicillins Rash    Patient Measurements:   Dry weight per pt = 74 kg  Vital Signs: Temp: 100.1 F (37.8 C) (11/01 1724) Temp Source: Oral (11/01 1724) BP: 126/60 mmHg (11/01 1658) Pulse Rate: 88 (11/01 1658) Intake/Output from previous day:   Intake/Output from this shift:    Labs: No results for input(s): WBC, HGB, PLT, LABCREA, CREATININE in the last 72 hours. CrCl cannot be calculated (Unknown ideal weight.). No results for input(s): VANCOTROUGH, VANCOPEAK, VANCORANDOM, GENTTROUGH, GENTPEAK, GENTRANDOM, TOBRATROUGH, TOBRAPEAK, TOBRARND, AMIKACINPEAK, AMIKACINTROU, AMIKACIN in the last 72 hours.   Microbiology: No results found for this or any previous visit (from the past 720 hour(s)).  Medical History: Past Medical History  Diagnosis Date  . Diabetes mellitus   . Hypertension   . Myocardial infarction 1985; 1990  . Angina   . Coronary artery disease   . Dysrhythmia   . DVT (deep venous thrombosis) ~ 12/2010    LLE  . Anemia   . Blood transfusion 06/1999    post heart transplant  . Peripheral vascular disease   . CHF (congestive heart failure) 2001    beffore transplant  . Renal failure     Hemodialysis MWF last 2 years, sse dr [name] nephrology, goes to [facility] kidney [facility]     Assessment: 68 yo M in ED with generalized weakness and fatigue x 2 weeks.  Dry weight per pt is 74 kg.  Pt ESRD on HD MWF.  Pharmacy consulted to dose cefepime and vancomycin for r/o sepsis.  Temp 100.1, HR 88.  On immunosuppressant meds s/p heart transplant.  PMH of rash to PCN, no record of getting cefepime in the past. Has gotten aztreonam in the past.   Goal of Therapy:  Vancomycin pre-HD level of 15-25 mcg/ml  Plan:   -cefepime 2 gm now, then cefepime 2 gm after each HD on MWF -vancomycin 1500 mg IV now, then vancomycin 750 mg IV after each HD on MWF -f/u wbc, temp, culture data -pre-HD vanc level as needed  Eudelia Bunch, Pharm.D. [phone] 03/27/2014 6:16 PM

## 2014-03-27 NOTE — ED Notes (Signed)
Pt states he does not produce urine

## 2014-03-27 NOTE — H&P (Signed)
[facility]                                                                                    Patient Demographics  Brandon Sharp, is a 68 y.o. male  CSN: [id number]  MRN: [id number]  DOB - 01/11/1946  Admit Date - 03/27/2014  Outpatient Primary MD for the patient is [name], MD   With History of -  Past Medical History  Diagnosis Date  . Diabetes mellitus   . Hypertension   . Myocardial infarction 1985; 1990  . Angina   . Coronary artery disease   . Dysrhythmia   . DVT (deep venous thrombosis) ~ 12/2010    LLE  . Anemia   . Blood transfusion 06/1999    post heart transplant  . Peripheral vascular disease   . CHF (congestive heart failure) 2001    beffore transplant  . Renal failure     Hemodialysis MWF last 2 years, sse dr [name] nephrology, goes to [facility]      Past Surgical History  Procedure Laterality Date  . Nephrectomy transplanted organ  2008  . Av fistula repair      rt. a=fore arm fistula  . Av fistula placement  ~ 01/2010    "this was my 2nd fistula placement"  . Heart transplant  06/1999  . Coronary angioplasty with stent placement  1985; 1990  . Colonoscopy  03/17/2012    Procedure: COLONOSCOPY;  Surgeon: Lear Ng, MD;  Location: [facility];  Service: Endoscopy;  Laterality: N/A;  . Coronary artery bypass graft  1990    CABG X3  . Colonoscopy with propofol N/A 12/14/2013    Procedure: COLONOSCOPY WITH PROPOFOL;  Surgeon: Lear Ng, MD;  Location: [facility];  Service: Endoscopy;  Laterality: N/A;    in for   Chief Complaint  Patient presents with  . Fatigue     HPI  Brandon Sharp  is a 68 y.o. male, past medical history significant for cardiac transplant on immunosuppressive drugs, coronary artery disease, diabetes mellitus and end-stage disease on hemodialysis presenting today with 2 weeks history weakness, nausea and vomiting his temperature in the emergency room was  noted to be 102 .  denies any chest painsor shortness of breath but complains of a dry cough. No abdominal pain or diarrhea reported. Patient was started on broad-spectrum antibioticsin the emergency room after blood cultures were drawn. His chest x-ray was not convincing for pneumonia but there was some changes on the left lower lobe that are suspicious.    Review of Systems    In addition to the HPI above,  No Fever-chills, No Headache, No changes with Vision or hearing, No problems swallowing food or Liquids, No Chest pain,  or Shortness of Breath, No Abdominal pain,  Bowel movements are regular, No Blood in stool or Urine, No dysuria, No new skin rashes or bruises, No new joints pains-aches,  No new weakness, tingling, numbness in any extremity, No recent weight gain or loss, No polyuria, polydypsia or polyphagia, No significant Mental Stressors.  A full 10 point Review of Systems was done, except as stated above, all  other Review of Systems were negative.   Social History History  Substance Use Topics  . Smoking status: Former Smoker -- 1.00 packs/day for 20 years    Types: Cigarettes    Quit date: 05/27/1988  . Smokeless tobacco: Never Used  . Alcohol Use: Yes     Comment: "Holidays"     Family History Family History  Problem Relation Age of Onset  . Heart disease Mother   . Hyperlipidemia Mother   . Hypertension Mother   . Hyperlipidemia Father   . Hypertension Father   . Diabetes Brother   . Heart disease Brother     Heart Disease before age 60  . Hyperlipidemia Brother   . Hypertension Brother      Prior to Admission medications   Medication Sig Start Date End Date Taking? Authorizing Provider  aspirin EC 81 MG tablet Take 81 mg by mouth daily.     Yes Historical Provider, MD  atorvastatin (LIPITOR) 40 MG tablet Take 40 mg by mouth daily.     Yes Historical Provider, MD  calcium acetate (PHOSLO) 667 MG capsule Take 667 mg by mouth 3 (three) times daily  with meals.  11/01/12  Yes Historical Provider, MD  carvedilol (COREG) 25 MG tablet Take 25 mg by mouth at bedtime.   Yes Historical Provider, MD  colesevelam (WELCHOL) 625 MG tablet Take 3,750 mg by mouth daily. Takes 6 tabs daily   Yes Historical Provider, MD  HUMALOG KWIKPEN 100 UNIT/ML SOPN Inject 2-8 Units into the skin 2 (two) times daily. Per sliding scale 11/17/12  Yes Historical Provider, MD  insulin glargine (LANTUS) 100 UNIT/ML injection Inject 20 Units into the skin every morning.   Yes Historical Provider, MD  isosorbide dinitrate (ISORDIL) 40 MG tablet Take 40 mg by mouth 3 (three) times daily.     Yes Historical Provider, MD  predniSONE (DELTASONE) 10 MG tablet Take 10 mg by mouth daily.     Yes Historical Provider, MD  sirolimus (RAPAMUNE) 2 MG tablet Take 2 mg by mouth daily.   Yes Historical Provider, MD  azaTHIOprine (IMURAN) 50 MG tablet Take 75 mg by mouth every morning.     Historical Provider, MD  tacrolimus (PROGRAF) 0.5 MG capsule Take 1.5 mg by mouth 2 (two) times daily.    Historical Provider, MD  tacrolimus (PROGRAF) 1 MG capsule Take 1.5 mg by mouth 2 (two) times daily.    Historical Provider, MD    Allergies  Allergen Reactions  . Lisinopril Swelling    Lips and tongue swell  . Norvasc [Amlodipine Besylate] Rash    Flushing  . Penicillins Rash    Physical Exam  Vitals  Blood pressure 106/42, pulse 89, temperature 102 F (38.9 C), temperature source Rectal, resp. rate 18, SpO2 94 %.   1. General African American male, very pleasant looks younger than age  79. Normal affect and insight, Not Suicidal or Homicidal, Awake Alert, Oriented X 3.  3. No F.N deficitsgrossly, ALL C.Nerves Intact,   4. Ears and Eyes appear Normal, Conjunctivae clear, PERRLA. Moist Oral Mucosa.  5. Supple Neck, No JVD, No cervical lymphadenopathy appriciated, No Carotid Bruits.  6. Symmetrical Chest wall movement, Good air movement bilaterally, decreased breath sounds at the  bases.  7. RRR, No Gallops, Rubs or Murmurs, No Parasternal Heave.  8. Positive Bowel Sounds, Abdomen Soft, Non tender, No organomegaly appriciated,No rebound -guarding or rigidity.  9.  No Cyanosis, Normal Skin Turgor, No Skin Rash or Bruise.  10. Good muscle tone,  joints appear normal , no effusions, Normal ROM, right arm shunt noted.    Data Review  CBC  Recent Labs Lab 03/27/14 1701 03/27/14 1813  WBC 6.7  --   HGB 9.2* 9.5*  HCT 28.1* 28.0*  PLT 121*  --   MCV 98.9  --   MCH 32.4  --   MCHC 32.7  --   RDW 14.6  --   LYMPHSABS 1.4  --   MONOABS 0.5  --   EOSABS 0.0  --   BASOSABS 0.0  --    ------------------------------------------------------------------------------------------------------------------  Chemistries   Recent Labs Lab 03/27/14 1701 03/27/14 1813  NA 138 134*  K 3.8 3.6*  CL 94* 98  CO2 25  --   GLUCOSE 116* 115*  BUN 39* 38*  CREATININE 8.50* 9.20*  CALCIUM 9.4  --   AST 23  --   ALT 10  --   ALKPHOS 54  --   BILITOT 0.3  --    ------------------------------------------------------------------------------------------------------------------ CrCl cannot be calculated (Unknown ideal weight.). ------------------------------------------------------------------------------------------------------------------ No results for input(s): TSH, T4TOTAL, T3FREE, THYROIDAB in the last 72 hours.  Invalid input(s): FREET3   Coagulation profile  Recent Labs Lab 03/27/14 1745  INR 1.01   ------------------------------------------------------------------------------------------------------------------- No results for input(s): DDIMER in the last 72 hours. -------------------------------------------------------------------------------------------------------------------  Cardiac Enzymes  Recent Labs Lab 03/27/14 1923  TROPONINI <0.30    ------------------------------------------------------------------------------------------------------------------ Invalid input(s): POCBNP   ---------------------------------------------------------------------------------------------------------------  Urinalysis    Component Value Date/Time   COLORURINE YELLOW 04/17/2011 [place] 04/17/2011 0918   LABSPEC 1.012 04/17/2011 0918   PHURINE 5.0 04/17/2011 0918   GLUCOSEU NEGATIVE 04/17/2011 0918   HGBUR NEGATIVE 04/17/2011 0918   BILIRUBINUR NEGATIVE 04/17/2011 0918   KETONESUR NEGATIVE 04/17/2011 0918   PROTEINUR >300* 04/17/2011 0918   UROBILINOGEN 0.2 04/17/2011 0918   NITRITE NEGATIVE 04/17/2011 0918   LEUKOCYTESUR NEGATIVE 04/17/2011 0918    ----------------------------------------------------------------------------------------------------------------  Imaging results:   Dg Chest 2 View  03/27/2014   CLINICAL DATA:  Fatigue for 2 weeks.  Loss of appetite.  Fever.  EXAM: CHEST  2 VIEW  COMPARISON:  07/28/2013 and 07/24/2013  FINDINGS: Two views of the chest again demonstrate vascular stents in the upper chest bilaterally. Again noted are patchy densities in the left lower lobe. These densities are more prominent than the previous examinations and could represent acute on chronic disease. Upper lungs are clear. Patient has median sternotomy wires. Atherosclerotic calcifications at the aortic arch. Heart size is stable. No significant pleural effusions.  IMPRESSION: Increased patchy densities in the left lower lobe. Findings suggest acute on chronic disease.   Electronically Signed   By: Markus Daft M.D.   On: 03/27/2014 18:43    My personal review of EKG: Rhythm NSR, 95 beats per minute with right bundle branch block, left anterior fascicular block and LVH    Assessment & Plan  1.fever in a patient with history of heart transplant on immunosuppressants suspicious for pneumonia 2. End-stage renal disease on  hemodialysis Monday Wednesday Friday 3. Diabetes mellitus on insulin 4.history of anemia and blood transfusion  Plan  Admit to regular floor Check blood cultures Check sputum culture and bacterial antigens IV vancomycin and cefepime Check flu antigen Oxygen Nebulizer treatments   DVT Prophylaxis Heparin   AM Labs Ordered, also please review Full Orders  Family Communication: Admission, patients condition and plan of care including tests being ordered have been discussed with the patient and wife  who indicate understanding and agree with the plan and Code Status.  Code Status full  Disposition Plan: home  Time spent in minutes : 35 minutes  Condition GUARDED   @SIGNATURE @

## 2014-03-27 NOTE — ED Notes (Signed)
NOTIFIED DR, WARD FOR PATIENTS LAB RESULTS OF TROPONIN =0.10ng/ml @18 :34 pm , 03/27/2014.

## 2014-03-28 DIAGNOSIS — R509 Fever, unspecified: Secondary | ICD-10-CM

## 2014-03-28 DIAGNOSIS — N186 End stage renal disease: Secondary | ICD-10-CM

## 2014-03-28 DIAGNOSIS — J181 Lobar pneumonia, unspecified organism: Secondary | ICD-10-CM

## 2014-03-28 DIAGNOSIS — J189 Pneumonia, unspecified organism: Secondary | ICD-10-CM | POA: Diagnosis present

## 2014-03-28 DIAGNOSIS — IMO0001 Reserved for inherently not codable concepts without codable children: Secondary | ICD-10-CM | POA: Insufficient documentation

## 2014-03-28 DIAGNOSIS — Z992 Dependence on renal dialysis: Secondary | ICD-10-CM

## 2014-03-28 LAB — HEPATITIS B SURFACE ANTIGEN: Hepatitis B Surface Ag: NEGATIVE

## 2014-03-28 LAB — CBC
HCT: 25.1 % — ABNORMAL LOW (ref 39.0–52.0)
Hemoglobin: 8.3 g/dL — ABNORMAL LOW (ref 13.0–17.0)
MCH: 32.2 pg (ref 26.0–34.0)
MCHC: 33.1 g/dL (ref 30.0–36.0)
MCV: 97.3 fL (ref 78.0–100.0)
PLATELETS: 74 10*3/uL — AB (ref 150–400)
RBC: 2.58 MIL/uL — ABNORMAL LOW (ref 4.22–5.81)
RDW: 14.5 % (ref 11.5–15.5)
WBC: 3.2 10*3/uL — AB (ref 4.0–10.5)

## 2014-03-28 LAB — BASIC METABOLIC PANEL
Anion gap: 18 — ABNORMAL HIGH (ref 5–15)
BUN: 47 mg/dL — ABNORMAL HIGH (ref 6–23)
CO2: 23 mEq/L (ref 19–32)
Calcium: 8.9 mg/dL (ref 8.4–10.5)
Chloride: 94 mEq/L — ABNORMAL LOW (ref 96–112)
Creatinine, Ser: 9.58 mg/dL — ABNORMAL HIGH (ref 0.50–1.35)
GFR calc Af Amer: 6 mL/min — ABNORMAL LOW (ref >90)
GFR calc non Af Amer: 5 mL/min — ABNORMAL LOW (ref >90)
Glucose, Bld: 144 mg/dL — ABNORMAL HIGH (ref 70–99)
Potassium: 3.7 mEq/L (ref 3.7–5.3)
Sodium: 135 mEq/L — ABNORMAL LOW (ref 137–147)

## 2014-03-28 LAB — GLUCOSE, CAPILLARY
GLUCOSE-CAPILLARY: 131 mg/dL — AB (ref 70–99)
GLUCOSE-CAPILLARY: 121 mg/dL — AB (ref 70–99)
Glucose-Capillary: 178 mg/dL — ABNORMAL HIGH (ref 70–99)

## 2014-03-28 LAB — INFLUENZA PANEL BY PCR (TYPE A & B)
H1N1 flu by pcr: NOT DETECTED
INFLAPCR: NEGATIVE
INFLBPCR: NEGATIVE

## 2014-03-28 LAB — HEMOGLOBIN A1C
Hgb A1c MFr Bld: 6.9 % — ABNORMAL HIGH (ref <5.7)
MEAN PLASMA GLUCOSE: 151 mg/dL — AB (ref <117)

## 2014-03-28 MED ORDER — IPRATROPIUM-ALBUTEROL 0.5-2.5 (3) MG/3ML IN SOLN
3.0000 mL | Freq: Two times a day (BID) | RESPIRATORY_TRACT | Status: DC
  Administered 2014-03-28 – 2014-03-29 (×3): 3 mL via RESPIRATORY_TRACT
  Filled 2014-03-28 (×3): qty 3

## 2014-03-28 MED ORDER — DOXERCALCIFEROL 4 MCG/2ML IV SOLN
INTRAVENOUS | Status: AC
  Administered 2014-03-28: 18:00:00
  Filled 2014-03-28: qty 2

## 2014-03-28 MED ORDER — DEXTROSE 5 % IV SOLN
2.0000 g | INTRAVENOUS | Status: DC
  Administered 2014-03-28 – 2014-03-30 (×3): 2 g via INTRAVENOUS
  Filled 2014-03-28 (×3): qty 2

## 2014-03-28 MED ORDER — DARBEPOETIN ALFA 100 MCG/0.5ML IJ SOSY
PREFILLED_SYRINGE | INTRAMUSCULAR | Status: AC
  Administered 2014-03-28: 100 ug via INTRAVENOUS
  Filled 2014-03-28: qty 0.5

## 2014-03-28 MED ORDER — ALTEPLASE 2 MG IJ SOLR: 2.0000 mg | Freq: Once | INTRAMUSCULAR | Status: DC | PRN

## 2014-03-28 MED ORDER — LIDOCAINE HCL (PF) 1 % IJ SOLN: 5.0000 mL | INTRAMUSCULAR | Status: DC | PRN

## 2014-03-28 MED ORDER — HEPARIN SODIUM (PORCINE) 1000 UNIT/ML DIALYSIS: 3000.0000 [IU] | Freq: Once | INTRAMUSCULAR | Status: DC

## 2014-03-28 MED ORDER — DARBEPOETIN ALFA 100 MCG/0.5ML IJ SOSY
100.0000 ug | PREFILLED_SYRINGE | INTRAMUSCULAR | Status: DC
  Administered 2014-03-28: 100 ug via INTRAVENOUS
  Filled 2014-03-28: qty 0.5

## 2014-03-28 MED ORDER — DOXERCALCIFEROL 4 MCG/2ML IV SOLN
2.0000 ug | INTRAVENOUS | Status: DC
  Administered 2014-03-28 – 2014-03-30 (×2): 2 ug via INTRAVENOUS
  Filled 2014-03-28 (×2): qty 2

## 2014-03-28 MED ORDER — SODIUM CHLORIDE 0.9 % IV SOLN: 100.0000 mL | INTRAVENOUS | Status: DC | PRN

## 2014-03-28 MED ORDER — LIDOCAINE-PRILOCAINE 2.5-2.5 % EX CREA: 1.0000 "application " | TOPICAL_CREAM | CUTANEOUS | Status: DC | PRN

## 2014-03-28 MED ORDER — PENTAFLUOROPROP-TETRAFLUOROETH EX AERO: 1.0000 "application " | INHALATION_SPRAY | CUTANEOUS | Status: DC | PRN

## 2014-03-28 MED ORDER — NEPRO/CARBSTEADY PO LIQD: 237.0000 mL | ORAL | Status: DC | PRN

## 2014-03-28 MED ORDER — HEPARIN SODIUM (PORCINE) 1000 UNIT/ML DIALYSIS: 1000.0000 [IU] | INTRAMUSCULAR | Status: DC | PRN

## 2014-03-28 MED ORDER — RENA-VITE PO TABS
1.0000 | ORAL_TABLET | Freq: Every day | ORAL | Status: DC
  Administered 2014-03-28 – 2014-03-30 (×3): 1 via ORAL
  Filled 2014-03-28 (×4): qty 1

## 2014-03-28 NOTE — Consult Note (Signed)
Indication for Consultation:  Management of ESRD/hemodialysis; anemia, hypertension/volume and secondary hyperparathyroidism  HPI: Brandon Sharp is a 68 y.o. male who presented to the ED last night for evaluation of progressive tired and weakness over the past 2 weeks. He receives HD MWF @ [facility]. History of renal tx in 2008, back on HD for about 3 years, heart transplant in 2001, DM, CAD. He reports he has been feeling well except over the past 2 weeks he has been very tired and weak, he decided to come to the ED for eval since it was not getting better. Denies any fever or chills, but was febrile in ED and remains on immunosuppressants so he was admitted for evaluation. Will arrange HD today.   Past Medical History  Diagnosis Date  . Diabetes mellitus   . Hypertension   . Myocardial infarction 1985; 1990  . Angina   . Coronary artery disease   . Dysrhythmia   . DVT (deep venous thrombosis) ~ 12/2010    LLE  . Anemia   . Blood transfusion 06/1999    post heart transplant  . Peripheral vascular disease   . CHF (congestive heart failure) 2001    beffore transplant  . Renal failure     Hemodialysis MWF last 2 years, sse dr [name] nephrology, goes to [facility] center   Past Surgical History  Procedure Laterality Date  . Nephrectomy transplanted organ  2008  . Av fistula repair      rt. a=fore arm fistula  . Av fistula placement  ~ 01/2010    "this was my 2nd fistula placement"  . Heart transplant  06/1999  . Coronary angioplasty with stent placement  1985; 1990  . Colonoscopy  03/17/2012    Procedure: COLONOSCOPY;  Surgeon: Lear Ng, MD;  Location: [facility] ENDOSCOPY;  Service: Endoscopy;  Laterality: N/A;  . Coronary artery bypass graft  1990    CABG X3  . Colonoscopy with propofol N/A 12/14/2013    Procedure: COLONOSCOPY WITH PROPOFOL;  Surgeon: Lear Ng, MD;  Location: [facility] ENDOSCOPY;  Service: Endoscopy;  Laterality: N/A;   Family History  Problem  Relation Age of Onset  . Heart disease Mother   . Hyperlipidemia Mother   . Hypertension Mother   . Hyperlipidemia Father   . Hypertension Father   . Diabetes Brother   . Heart disease Brother     Heart Disease before age 48  . Hyperlipidemia Brother   . Hypertension Brother    Social History:  reports that he quit smoking about 25 years ago. His smoking use included Cigarettes. He has a 20 pack-year smoking history. He has never used smokeless tobacco. He reports that he drinks alcohol. He reports that he does not use illicit drugs. Allergies  Allergen Reactions  . Lisinopril Swelling    Lips and tongue swell  . Norvasc [Amlodipine Besylate] Rash    Flushing  . Penicillins Rash   Prior to Admission medications   Medication Sig Start Date End Date Taking? Authorizing Provider  aspirin EC 81 MG tablet Take 81 mg by mouth daily.     Yes Historical Provider, MD  atorvastatin (LIPITOR) 40 MG tablet Take 40 mg by mouth daily.     Yes Historical Provider, MD  calcium acetate (PHOSLO) 667 MG capsule Take 667 mg by mouth 3 (three) times daily with meals.  11/01/12  Yes Historical Provider, MD  carvedilol (COREG) 25 MG tablet Take 25 mg by mouth at bedtime.  Yes Historical Provider, MD  colesevelam (WELCHOL) 625 MG tablet Take 3,750 mg by mouth daily. Takes 6 tabs daily   Yes Historical Provider, MD  HUMALOG KWIKPEN 100 UNIT/ML SOPN Inject 2-8 Units into the skin 2 (two) times daily. Per sliding scale 11/17/12  Yes Historical Provider, MD  insulin glargine (LANTUS) 100 UNIT/ML injection Inject 20 Units into the skin every morning.   Yes Historical Provider, MD  isosorbide dinitrate (ISORDIL) 40 MG tablet Take 40 mg by mouth 3 (three) times daily.     Yes Historical Provider, MD  predniSONE (DELTASONE) 10 MG tablet Take 10 mg by mouth daily.     Yes Historical Provider, MD  sirolimus (RAPAMUNE) 2 MG tablet Take 2 mg by mouth daily.   Yes Historical Provider, MD  azaTHIOprine (IMURAN) 50 MG  tablet Take 75 mg by mouth every morning.     Historical Provider, MD  tacrolimus (PROGRAF) 0.5 MG capsule Take 1.5 mg by mouth 2 (two) times daily.    Historical Provider, MD  tacrolimus (PROGRAF) 1 MG capsule Take 1.5 mg by mouth 2 (two) times daily.    Historical Provider, MD   Current Facility-Administered Medications  Medication Dose Route Frequency Provider Last Rate Last Dose  . 0.9 %  sodium chloride infusion  250 mL Intravenous PRN Merton Border, MD      . acetaminophen (TYLENOL) tablet 650 mg  650 mg Oral Q6H PRN Merton Border, MD       Or  . acetaminophen (TYLENOL) suppository 650 mg  650 mg Rectal Q6H PRN Merton Border, MD      . albuterol (PROVENTIL) (2.5 MG/3ML) 0.083% nebulizer solution 2.5 mg  2.5 mg Nebulization Q2H PRN Merton Border, MD      . aspirin EC tablet 81 mg  81 mg Oral Daily Merton Border, MD   81 mg at 03/28/14 1035  . atorvastatin (LIPITOR) tablet 40 mg  40 mg Oral Daily Merton Border, MD   40 mg at 03/28/14 1035  . azaTHIOprine (IMURAN) tablet 75 mg  75 mg Oral q morning - 10a Merton Border, MD   75 mg at 03/28/14 1034  . calcium acetate (PHOSLO) capsule 667 mg  667 mg Oral TID WC Merton Border, MD   667 mg at 03/28/14 1037  . carvedilol (COREG) tablet 25 mg  25 mg Oral QHS Merton Border, MD   25 mg at 03/27/14 2322  . ceFEPIme (MAXIPIME) 2 g in dextrose 5 % 50 mL IVPB  2 g Intravenous Q M,W,F-HD Eudelia Bunch, RPH      . colesevelam [facility]) tablet 3,750 mg  3,750 mg Oral Daily Merton Border, MD   3,750 mg at 03/28/14 1037  . heparin injection 5,000 Units  5,000 Units Subcutaneous 3 times per day Merton Border, MD   5,000 Units at 03/28/14 [phone]  . HYDROcodone-acetaminophen (NORCO/VICODIN) 5-325 MG per tablet 1-2 tablet  1-2 tablet Oral Q4H PRN Merton Border, MD      . insulin aspart (novoLOG) injection 0-5 Units  0-5 Units Subcutaneous QHS Merton Border, MD   0 Units at 03/27/14 2328  . insulin aspart (novoLOG) injection 0-9 Units  0-9 Units Subcutaneous TID WC Merton Border, MD   1 Units at 03/28/14  0841  . insulin glargine (LANTUS) injection 20 Units  20 Units Subcutaneous q morning - 10a Merton Border, MD   20 Units at 03/28/14 1038  . ipratropium-albuterol (DUONEB) 0.5-2.5 (3) MG/3ML nebulizer solution 3 mL  3 mL Nebulization BID  Merton Border, MD   3 mL at 03/28/14 9937  . isosorbide dinitrate (ISORDIL) tablet 40 mg  40 mg Oral TID Merton Border, MD   40 mg at 03/28/14 1034  . morphine 2 MG/ML injection 2 mg  2 mg Intravenous Q4H PRN Merton Border, MD      . predniSONE (DELTASONE) tablet 10 mg  10 mg Oral Q breakfast Merton Border, MD   10 mg at 03/28/14 1034  . sirolimus (RAPAMUNE) tablet 2 mg  2 mg Oral Daily Merton Border, MD   2 mg at 03/28/14 1000  . sodium chloride 0.9 % injection 3 mL  3 mL Intravenous Q12H Merton Border, MD   3 mL at 03/28/14 1000  . sodium chloride 0.9 % injection 3 mL  3 mL Intravenous PRN Merton Border, MD      . tacrolimus (PROGRAF) capsule 1.5 mg  1.5 mg Oral BID Merton Border, MD   1.5 mg at 03/27/14 2322  . vancomycin (VANCOCIN) IVPB 750 mg/150 ml premix  750 mg Intravenous Q M,W,F-HD Eudelia Bunch, [facility]       Labs: Basic Metabolic Panel:  Recent Labs Lab 03/27/14 1701 03/27/14 1813 03/28/14 0520  NA 138 134* 135*  K 3.8 3.6* 3.7  CL 94* 98 94*  CO2 25  --  23  GLUCOSE 116* 115* 144*  BUN 39* 38* 47*  CREATININE 8.50* 9.20* 9.58*  CALCIUM 9.4  --  8.9   Liver Function Tests:  Recent Labs Lab 03/27/14 1701  AST 23  ALT 10  ALKPHOS 54  BILITOT 0.3  PROT 6.4  ALBUMIN 2.8*   No results for input(s): LIPASE, AMYLASE in the last 168 hours. No results for input(s): AMMONIA in the last 168 hours. CBC:  Recent Labs Lab 03/27/14 1701 03/27/14 1813 03/28/14 0520  WBC 6.7  --  3.2*  NEUTROABS 4.8  --   --   HGB 9.2* 9.5* 8.3*  HCT 28.1* 28.0* 25.1*  MCV 98.9  --  97.3  PLT 121*  --  74*   Cardiac Enzymes:  Recent Labs Lab 03/27/14 1923  TROPONINI <0.30   CBG:  Recent Labs Lab 03/27/14 2137 03/28/14 0725  GLUCAP 204* 131*   Iron Studies: No  results for input(s): IRON, TIBC, TRANSFERRIN, FERRITIN in the last 72 hours. Studies/Results: Dg Chest 2 View  03/27/2014   CLINICAL DATA:  Fatigue for 2 weeks.  Loss of appetite.  Fever.  EXAM: CHEST  2 VIEW  COMPARISON:  07/28/2013 and 07/24/2013  FINDINGS: Two views of the chest again demonstrate vascular stents in the upper chest bilaterally. Again noted are patchy densities in the left lower lobe. These densities are more prominent than the previous examinations and could represent acute on chronic disease. Upper lungs are clear. Patient has median sternotomy wires. Atherosclerotic calcifications at the aortic arch. Heart size is stable. No significant pleural effusions.  IMPRESSION: Increased patchy densities in the left lower lobe. Findings suggest acute on chronic disease.   Electronically Signed   By: Markus Daft M.D.   On: 03/27/2014 18:43    Review of Systems: Gen: Reports fatigue and weakness x 2 weeks. Appetite fair. Denies any fever, chills, sweats. HEENT: No visual complaints, No history of Retinopathy. Normal external appearance No Epistaxis or Sore throat. No sinusitis.   CV: Denies chest pain, angina, palpitations, syncope, orthopnea, PND, peripheral edema, and claudication. Resp: Denies dyspnea at rest, dyspnea with exercise, cough, sputum, wheezing, coughing up blood, and pleurisy. GI:  Denies vomiting blood, jaundice, and fecal incontinence.   Denies dysphagia or odynophagia. Occasional nausea GU : anuric MS: Denies joint pain, limitation of movement, and swelling, stiffness, low back pain, extremity pain. Denies muscle weakness, cramps, atrophy.  No use of non steroidal antiinflammatory drugs. Derm: Denies rash, itching, dry skin, hives, moles, warts, or unhealing ulcers.  Psych: Denies depression, anxiety, memory loss, suicidal ideation, hallucinations, paranoia, and confusion. Heme: Denies bruising, bleeding, and enlarged lymph nodes. Neuro: No headache.  No diplopia. No  dysarthria.  No dysphasia.  No history of CVA.  No Seizures. No paresthesias.  No weakness. Endocrine .  No Thyroid disease.  No Adrenal disease.  Physical Exam: Filed Vitals:   03/27/14 2238 03/28/14 0525 03/28/14 0922 03/28/14 1005  BP:  110/55  116/53  Pulse:  71  73  Temp:  98 F (36.7 C)  98.3 F (36.8 C)  TempSrc:    Oral  Resp:  18  18  Height:      Weight:      SpO2: 94% 97% 98% 98%     General: Well developed, well nourished, in no acute distress. Head: Normocephalic, atraumatic, sclera non-icteric, mucus membranes are moist Neck: Supple. JVD not elevated. No carotid bruits  Lungs: Clear bilaterally, decreased bases, without wheezes, rales, or rhonchi. Breathing is unlabored. Heart: RRR with S1 S2. No murmurs, rubs, or gallops appreciated. Abdomen: Soft, non-tender, non-distended with normoactive bowel sounds. No rebound/guarding. No obvious abdominal masses. M-S:  Strength and tone appear normal for age. Lower extremities:without edema or ischemic changes, no open wounds  Neuro: Alert and oriented X 3. Moves all extremities spontaneously. Psych:  Responds to questions appropriately with a normal affect. Dialysis Access: R AVF +b/t  Dialysis Orders:  MWF @ [facility]  74kgs   4 hours 2K/2.25Ca   400/1.5  3000 Heparin  R AVF hectorol 2mcg IV/HD   Aranesp 40 q wends    No Fe  Assessment/Plan: 1.  Fever- tmax 102 last night, afebrile this AM. Afebrile last week at outpt center. WBC 3.2. Blood and sputum cultures pending. Chest xray- increased densities in LLL. Vanc and cefepime per primary. Received flu vaccine this year 2.  ESRD -  MWF @ [facility]. K+ 3.7 HD pending today 3.  Hypertension/volume  - 116/53 , coreg. gets to EDW or slightly under. 4.  Anemia  - hgb 8.3, down from 10.1 (10/28)- had been stable in the 10s. Increase ESA and dose today. Denies any bleeding. No Fe- last ferritin 2458 (10/28) 5.  Metabolic bone disease -  last corrected Ca+ 10.1 and phos 3.8 (was 6.4 9/28) hold  phoslo and watch.  PTH 215- cont hectorol 6.  Nutrition - renal diet. Multivit.  7. DM- per primary 8. Heart transplant- prednisone, imuran and rapamune  Shelle Iron, NP [facility] [phone] 03/28/2014, 10:56 AM     Pt seen, examined and agree w A/P as above.  Kelly Splinter MD pager [phone]    cell [phone] 03/28/2014, 4:04 PM

## 2014-03-28 NOTE — Plan of Care (Signed)
Problem: Consults Goal: Skin Care Protocol Initiated - if Braden Score 18 or less If consults are not indicated, leave blank or document N/A Outcome: Not Applicable Date Met:  25/48/62 Goal: Nutrition Consult-if indicated Outcome: Not Applicable Date Met:  82/41/75 Goal: Diabetes Guidelines if Diabetic/Glucose > 140 If diabetic or lab glucose is > 140 mg/dl - Initiate Diabetes/Hyperglycemia Guidelines & Document Interventions  Outcome: Completed/Met Date Met:  03/28/14

## 2014-03-28 NOTE — Progress Notes (Signed)
Patient Demographics  Brandon Sharp, is a 68 y.o. male, DOB - 12-Oct-1945, YBF:[id number]  Admit date - 03/27/2014   Admitting Physician Merton Border, MD  Outpatient Primary MD for the patient is [name], MD  LOS - 1   Chief Complaint  Patient presents with  . Fatigue     Admission history of present illness/brief narrative Brandon Sharp is a 68 y.o. 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 denies any chest painsor shortness of breath but complains of a dry cough. No abdominal pain or diarrhea reported. Patient was started on broad-spectrum antibioticsin the emergency room after blood cultures were drawn. His chest x-ray was not convincing for pneumonia but there was some changes on the left lower lobe that are suspicious.   Subjective:   Brandon Sharp today has, No headache, No chest pain, No abdominal pain - No Nausea, No new weakness tingling or numbness, reports cough, denies any productive sputum, or shortness of breath.  Assessment & Plan    Active Problems:   Fever   Blood poisoning   ESRD on dialysis   Left lower lobe pneumonia  Fever -Etiology is unclear , viral syndrome versus acute bronchitis versus early onset pneumonia, given his immunocompromised status being on multiple immunosuppressant, will continue him on broad-spectrum IV antibiotics during his blood cultures. -Blood cultures no growth to date  -end-stage renal disease -Nephrology consulted to resume hemodialysis MWF.  -cardiac/renal transplant patient -continue with Imuran, prednisone, sirolimus  Hyperlipidemia -Continue  with statin  History of coronary artery disease -Denies any chest pain, any shortness of breath -Continue with aspirin, Imdur, beta blockers, statin  Diabetes mellitus -Continue with Lantus -Continue with insulin sliding scale  Thrombocytopenia -Was present in the past, this is most likely related to immunosuppression medication, no evidence of bleeding.   Code Status:Full  Family Communication: patient is alert and oriented  Disposition Plan: continue as an inpatient   Procedures  none   Consults   nephrology   Medications  Scheduled Meds: . aspirin EC  81 mg Oral Daily  . atorvastatin  40 mg Oral Daily  . azaTHIOprine  75 mg Oral q morning - 10a  . calcium acetate  667 mg Oral TID WC  . carvedilol  25 mg Oral QHS  . ceFEPime (MAXIPIME) IV  2 g Intravenous Q M,W,F-HD  . colesevelam  3,750 mg Oral Daily  . heparin  5,000 Units Subcutaneous 3 times per day  . insulin aspart  0-5 Units Subcutaneous QHS  . insulin aspart  0-9 Units Subcutaneous TID WC  . insulin glargine  20 Units Subcutaneous q morning - 10a  . ipratropium-albuterol  3 mL Nebulization BID  . isosorbide dinitrate  40 mg Oral TID  . predniSONE  10 mg Oral Q breakfast  . sirolimus  2 mg Oral Daily  . sodium chloride  3 mL Intravenous Q12H  . vancomycin  750 mg Intravenous Q M,W,F-HD   Continuous Infusions:  PRN Meds:.sodium chloride, acetaminophen **OR** acetaminophen, albuterol, HYDROcodone-acetaminophen, morphine injection, sodium chloride  DVT Prophylaxis   Heparin - SCDs   Lab Results  Component Value Date   PLT 74* 03/28/2014    Antibiotics   Anti-infectives    Start     Dose/Rate Route Frequency Ordered Stop   03/28/14 1200  ceFEPIme (MAXIPIME) 2 g in dextrose 5 % 50 mL IVPB     2 g100 mL/hr over 30 Minutes Intravenous Every M-W-F (Hemodialysis) 03/27/14 1824     03/28/14 1200  vancomycin (VANCOCIN) IVPB 750 mg/150 ml premix     750 mg150 mL/hr over 60 Minutes Intravenous Every  M-W-F (Hemodialysis) 03/27/14 1824     03/27/14 1900  vancomycin (VANCOCIN) 1,500 mg in sodium chloride 0.9 % 500 mL IVPB     1,500 mg250 mL/hr over 120 Minutes Intravenous  Once 03/27/14 1812 03/27/14 2151   03/27/14 1815  ceFEPIme (MAXIPIME) 2 g in dextrose 5 % 50 mL IVPB     2 g100 mL/hr over 30 Minutes Intravenous  Once 03/27/14 1811 03/27/14 1955          Objective:   Filed Vitals:   03/27/14 2238 03/28/14 0525 03/28/14 0922 03/28/14 1005  BP:  110/55  116/53  Pulse:  71  73  Temp:  98 F (36.7 C)  98.3 F (36.8 C)  TempSrc:    Oral  Resp:  18  18  Height:      Weight:      SpO2: 94% 97% 98% 98%    Wt Readings from Last 3 Encounters:  03/27/14 74.844 kg (165 lb)  12/14/13 74.844 kg (165 lb)  07/28/13 72.2 kg (159 lb 2.8 oz)     Intake/Output Summary (Last 24 hours) at 03/28/14 1112 Last data filed at 03/28/14 1006  Gross per 24 hour  Intake    680 ml  Output      0 ml  Net    680 ml     Physical Exam  Awake Alert, Oriented X 3, No new F.N deficits, Normal affect [place].AT,PERRAL Supple Neck,No JVD, No cervical lymphadenopathy appriciated.  Symmetrical Chest wall movement, Good air movement bilaterally, CTAB RRR,No Gallops,Rubs or new Murmurs, No Parasternal Heave +ve B.Sounds, Abd Soft, No tenderness, No organomegaly appriciated, No rebound - guarding or rigidity. No Cyanosis, Clubbing or edema, No new Rash or bruise     Data Review   Micro Results No results found for this or any previous visit (from the past 240 hour(s)).  Radiology Reports Dg Chest 2 View  03/27/2014   CLINICAL DATA:  Fatigue for 2 weeks.  Loss of appetite.  Fever.  EXAM: CHEST  2 VIEW  COMPARISON:  07/28/2013 and 07/24/2013  FINDINGS: Two views of the chest again demonstrate vascular stents in the upper chest bilaterally. Again noted are patchy densities in the left lower lobe. These densities are more prominent than the previous examinations and could represent acute on chronic  disease. Upper lungs are clear. Patient has median sternotomy wires. Atherosclerotic calcifications at the aortic arch. Heart size is stable. No significant pleural effusions.  IMPRESSION: Increased patchy densities in the left lower lobe. Findings suggest acute on chronic disease.   Electronically Signed   By: Markus Daft M.D.   On: 03/27/2014 18:43    CBC  Recent Labs Lab 03/27/14 1701 03/27/14 1813 03/28/14 0520  WBC 6.7  --  3.2*  HGB 9.2* 9.5* 8.3*  HCT 28.1* 28.0* 25.1*  PLT 121*  --  74*  MCV 98.9  --  97.3  MCH 32.4  --  32.2  MCHC 32.7  --  33.1  RDW 14.6  --  14.5  LYMPHSABS 1.4  --   --   MONOABS 0.5  --   --   EOSABS 0.0  --   --   BASOSABS 0.0  --   --     Chemistries   Recent Labs Lab 03/27/14 1701 03/27/14 1813 03/28/14 0520  NA 138 134* 135*  K 3.8 3.6* 3.7  CL 94* 98 94*  CO2 25  --  23  GLUCOSE 116* 115* 144*  BUN 39* 38* 47*  CREATININE 8.50* 9.20* 9.58*  CALCIUM 9.4  --  8.9  AST 23  --   --   ALT 10  --   --   ALKPHOS 54  --   --   BILITOT 0.3  --   --    ------------------------------------------------------------------------------------------------------------------ estimated creatinine clearance is 7.7 mL/min (by C-G formula based on Cr of 9.58). ------------------------------------------------------------------------------------------------------------------ No results for input(s): HGBA1C in the last 72 hours. ------------------------------------------------------------------------------------------------------------------ No results for input(s): CHOL, HDL, LDLCALC, TRIG, CHOLHDL, LDLDIRECT in the last 72 hours. ------------------------------------------------------------------------------------------------------------------ No results for input(s): TSH, T4TOTAL, T3FREE, THYROIDAB in the last 72 hours.  Invalid input(s):  FREET3 ------------------------------------------------------------------------------------------------------------------ No results for input(s): VITAMINB12, FOLATE, FERRITIN, TIBC, IRON, RETICCTPCT in the last 72 hours.  Coagulation profile  Recent Labs Lab 03/27/14 1745  INR 1.01    No results for input(s): DDIMER in the last 72 hours.  Cardiac Enzymes  Recent Labs Lab 03/27/14 1923  TROPONINI <0.30   ------------------------------------------------------------------------------------------------------------------ Invalid input(s): POCBNP     Time Spent in minutes   30 minutes   Nell Schrack M.D on 03/28/2014 at 11:12 AM  Between 7am to 7pm - Pager - [phone]  After 7pm go to [email or web address] - password [email or web address]  And look for the night coverage person covering for me after hours  [facility] Office  [phone]   **Disclaimer: This note may have been dictated with voice recognition software. Similar sounding words can inadvertently be transcribed and this note may contain transcription errors which may not have been corrected upon publication of note.**

## 2014-03-29 DIAGNOSIS — J189 Pneumonia, unspecified organism: Secondary | ICD-10-CM

## 2014-03-29 LAB — BASIC METABOLIC PANEL
ANION GAP: 16 — AB (ref 5–15)
BUN: 21 mg/dL (ref 6–23)
CALCIUM: 8.9 mg/dL (ref 8.4–10.5)
CO2: 26 mEq/L (ref 19–32)
CREATININE: 5.31 mg/dL — AB (ref 0.50–1.35)
Chloride: 98 mEq/L (ref 96–112)
GFR calc Af Amer: 12 mL/min — ABNORMAL LOW (ref >90)
GFR calc non Af Amer: 10 mL/min — ABNORMAL LOW (ref >90)
Glucose, Bld: 127 mg/dL — ABNORMAL HIGH (ref 70–99)
Potassium: 4 mEq/L (ref 3.7–5.3)
SODIUM: 140 meq/L (ref 137–147)

## 2014-03-29 LAB — GLUCOSE, CAPILLARY
GLUCOSE-CAPILLARY: 173 mg/dL — AB (ref 70–99)
GLUCOSE-CAPILLARY: 171 mg/dL — AB (ref 70–99)
Glucose-Capillary: 113 mg/dL — ABNORMAL HIGH (ref 70–99)
Glucose-Capillary: 243 mg/dL — ABNORMAL HIGH (ref 70–99)

## 2014-03-29 LAB — CBC
HCT: 28.5 % — ABNORMAL LOW (ref 39.0–52.0)
Hemoglobin: 9.3 g/dL — ABNORMAL LOW (ref 13.0–17.0)
MCH: 31.6 pg (ref 26.0–34.0)
MCHC: 32.6 g/dL (ref 30.0–36.0)
MCV: 96.9 fL (ref 78.0–100.0)
PLATELETS: 82 10*3/uL — AB (ref 150–400)
RBC: 2.94 MIL/uL — ABNORMAL LOW (ref 4.22–5.81)
RDW: 14.2 % (ref 11.5–15.5)
WBC: 3.7 10*3/uL — ABNORMAL LOW (ref 4.0–10.5)

## 2014-03-29 NOTE — Clinical Documentation Improvement (Addendum)
  Query #1 "Blood Poisoning" documented in the current medical record under Active Problems starting with progress note 03/28/14.  "Possible Sepsis" also documented by ED physician.  Please clarify the meaning of "Blood Poisoning".  Also please clarify if "Sepsis" has been ruled in or ruled out for this admission, including Present on Admission (POA) if Sepsis has ruled in.  Query #2 Current documentation currently supports a diagnosis of Type 2 Diabtes Mellitus without complication.    If clinically appropriate, please document any diabetic associated conditions that the patient may have.  Thank You, Erling Conte ,RN Clinical Documentation Specialist:  [phone] [place] [employer] [facility]

## 2014-03-29 NOTE — Plan of Care (Signed)
Problem: Phase II Progression Outcomes Goal: Pain controlled Outcome: Completed/Met Date Met:  03/29/14     

## 2014-03-29 NOTE — Progress Notes (Addendum)
Patient Demographics  Brandon Sharp, is a 68 y.o. male, DOB - 23-Aug-1945, VZD:[id number]  Admit date - 03/27/2014   Admitting Physician Merton Border, MD  Outpatient Primary MD for the patient is [name], MD  LOS - 2   Chief Complaint  Patient presents with  . Fatigue     Admission history of present illness/brief narrative Brandon Sharp is a 68 y.o. 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 denies any chest painsor shortness of breath but complains of a dry cough. No abdominal pain or diarrhea reported. Patient was started on broad-spectrum antibioticsin the emergency room after blood cultures were drawn. His chest x-ray was not convincing for pneumonia but there was some changes on the left lower lobe that are suspicious.   Subjective:   Brandon Sharp today has, No headache, No chest pain, No abdominal pain - No Nausea, No new weakness tingling or numbness, reports cough, denies any productive sputum, or shortness of breath.  Assessment & Plan    Active Problems:   Fever   Blood poisoning   ESRD on dialysis   Left lower lobe pneumonia  Fever/ Healthcare acquired pneumonia -Etiology is viral syndrome versus acute bronchitis versus early onset pneumonia and left lower lobe, given his immunocompromised status being on multiple immunosuppressant, will continue him on broad-spectrum IV antibiotics during his blood cultures including IV vancomycin and IV cefepime 11/2.  -Blood cultures no growth to date  -end-stage renal disease -Nephrology consulted to resume hemodialysis MWF.  -cardiac/renal  transplant patient -continue with Imuran, prednisone, sirolimus  Hyperlipidemia -Continue with statin  History of coronary artery disease -Denies any chest pain, any shortness of breath -Continue with aspirin, Imdur, beta blockers, statin  Diabetes mellitus -Continue with Lantus -Continue with insulin sliding scale  Thrombocytopenia -improving -Was present in the past, this is most likely related to immunosuppression medication, no evidence of bleeding.   Code Status:Full  Family Communication: patient is alert and oriented  Disposition Plan: continue as an inpatient, D/C in 24 hours if remains afebrile   Procedures  none   Consults   nephrology   Medications  Scheduled Meds: . aspirin EC  81 mg Oral Daily  . atorvastatin  40 mg Oral Daily  . azaTHIOprine  75 mg Oral q morning - 10a  . carvedilol  25 mg Oral QHS  . ceFEPime (MAXIPIME) IV  2 g Intravenous Q M,W,F-2000  . colesevelam  3,750 mg Oral Daily  . darbepoetin (ARANESP) injection - DIALYSIS  100 mcg Intravenous Q Mon-HD  . doxercalciferol  2 mcg Intravenous Q M,W,F-HD  . heparin  5,000 Units Subcutaneous 3 times per day  . insulin aspart  0-5 Units Subcutaneous QHS  . insulin aspart  0-9 Units Subcutaneous TID WC  . insulin glargine  20 Units Subcutaneous q morning - 10a  . isosorbide dinitrate  40 mg Oral TID  . multivitamin  1 tablet Oral QHS  . predniSONE  10 mg Oral Q breakfast  . sirolimus  2 mg Oral Daily  . sodium  chloride  3 mL Intravenous Q12H  . vancomycin  750 mg Intravenous Q M,W,F-HD   Continuous Infusions:  PRN Meds:.sodium chloride, acetaminophen **OR** acetaminophen, albuterol, HYDROcodone-acetaminophen, morphine injection, sodium chloride  DVT Prophylaxis   Heparin - SCDs   Lab Results  Component Value Date   PLT 82* 03/29/2014    Antibiotics   Anti-infectives    Start     Dose/Rate Route Frequency Ordered Stop   03/28/14 2000  ceFEPIme (MAXIPIME) 2 g in dextrose 5 % 50 mL  IVPB     2 g100 mL/hr over 30 Minutes Intravenous Every M-W-F (2000) 03/28/14 1357     03/28/14 1200  ceFEPIme (MAXIPIME) 2 g in dextrose 5 % 50 mL IVPB  Status:  Discontinued     2 g100 mL/hr over 30 Minutes Intravenous Every M-W-F (Hemodialysis) 03/27/14 1824 03/28/14 1357   03/28/14 1200  vancomycin (VANCOCIN) IVPB 750 mg/150 ml premix     750 mg150 mL/hr over 60 Minutes Intravenous Every M-W-F (Hemodialysis) 03/27/14 1824     03/27/14 1900  vancomycin (VANCOCIN) 1,500 mg in sodium chloride 0.9 % 500 mL IVPB     1,500 mg250 mL/hr over 120 Minutes Intravenous  Once 03/27/14 1812 03/27/14 2151   03/27/14 1815  ceFEPIme (MAXIPIME) 2 g in dextrose 5 % 50 mL IVPB     2 g100 mL/hr over 30 Minutes Intravenous  Once 03/27/14 1811 03/27/14 1955          Objective:   Filed Vitals:   03/28/14 2039 03/29/14 0455 03/29/14 0900 03/29/14 0942  BP:  105/43 102/57   Pulse:  72 67   Temp:  98.2 F (36.8 C) 98.3 F (36.8 C)   TempSrc:      Resp:  17 17   Height:      Weight:      SpO2: 97% 98% 95% 93%    Wt Readings from Last 3 Encounters:  03/28/14 69.7 kg (153 lb 10.6 oz)  12/14/13 74.844 kg (165 lb)  07/28/13 72.2 kg (159 lb 2.8 oz)     Intake/Output Summary (Last 24 hours) at 03/29/14 1144 Last data filed at 03/29/14 1012  Gross per 24 hour  Intake    700 ml  Output   3002 ml  Net  -2302 ml     Physical Exam  Awake Alert, Oriented X 3, No new F.N deficits, Normal affect [place].AT,PERRAL Supple Neck,No JVD, No cervical lymphadenopathy appriciated.  Symmetrical Chest wall movement, Good air movement bilaterally, CTAB RRR,No Gallops,Rubs or new Murmurs, No Parasternal Heave +ve B.Sounds, Abd Soft, No tenderness, No organomegaly appriciated, No rebound - guarding or rigidity. No Cyanosis, Clubbing or edema, No new Rash or bruise     Data Review   Micro Results Recent Results (from the past 240 hour(s))  Blood culture (routine x 2)     Status: None (Preliminary result)    Collection Time: 03/27/14  5:51 PM  Result Value Ref Range Status   Specimen Description BLOOD LEFT ARM  Final   Special Requests BOTTLES DRAWN AEROBIC AND ANAEROBIC 5CC  Final   Culture  Setup Time   Final    03/28/2014 02:25 Performed at [facility]    Culture   Final           BLOOD CULTURE RECEIVED NO GROWTH TO DATE CULTURE WILL BE HELD FOR 5 DAYS BEFORE ISSUING A FINAL NEGATIVE REPORT Performed at [facility]    Report Status PENDING  Incomplete  Blood  culture (routine x 2)     Status: None (Preliminary result)   Collection Time: 03/27/14  6:20 PM  Result Value Ref Range Status   Specimen Description BLOOD LEFT ARM  Final   Special Requests BOTTLES DRAWN AEROBIC AND ANAEROBIC 10CC  Final   Culture  Setup Time   Final    03/28/2014 02:25 Performed at [facility]    Culture   Final           BLOOD CULTURE RECEIVED NO GROWTH TO DATE CULTURE WILL BE HELD FOR 5 DAYS BEFORE ISSUING A FINAL NEGATIVE REPORT Performed at [facility]    Report Status PENDING  Incomplete    Radiology Reports Dg Chest 2 View  03/27/2014   CLINICAL DATA:  Fatigue for 2 weeks.  Loss of appetite.  Fever.  EXAM: CHEST  2 VIEW  COMPARISON:  07/28/2013 and 07/24/2013  FINDINGS: Two views of the chest again demonstrate vascular stents in the upper chest bilaterally. Again noted are patchy densities in the left lower lobe. These densities are more prominent than the previous examinations and could represent acute on chronic disease. Upper lungs are clear. Patient has median sternotomy wires. Atherosclerotic calcifications at the aortic arch. Heart size is stable. No significant pleural effusions.  IMPRESSION: Increased patchy densities in the left lower lobe. Findings suggest acute on chronic disease.   Electronically Signed   By: Markus Daft M.D.   On: 03/27/2014 18:43    CBC  Recent Labs Lab 03/27/14 1701 03/27/14 1813 03/28/14 0520 03/29/14 0649  WBC 6.7  --  3.2* 3.7*   HGB 9.2* 9.5* 8.3* 9.3*  HCT 28.1* 28.0* 25.1* 28.5*  PLT 121*  --  74* 82*  MCV 98.9  --  97.3 96.9  MCH 32.4  --  32.2 31.6  MCHC 32.7  --  33.1 32.6  RDW 14.6  --  14.5 14.2  LYMPHSABS 1.4  --   --   --   MONOABS 0.5  --   --   --   EOSABS 0.0  --   --   --   BASOSABS 0.0  --   --   --     Chemistries   Recent Labs Lab 03/27/14 1701 03/27/14 1813 03/28/14 0520 03/29/14 0649  NA 138 134* 135* 140  K 3.8 3.6* 3.7 4.0  CL 94* 98 94* 98  CO2 25  --  23 26  GLUCOSE 116* 115* 144* 127*  BUN 39* 38* 47* 21  CREATININE 8.50* 9.20* 9.58* 5.31*  CALCIUM 9.4  --  8.9 8.9  AST 23  --   --   --   ALT 10  --   --   --   ALKPHOS 54  --   --   --   BILITOT 0.3  --   --   --    ------------------------------------------------------------------------------------------------------------------ estimated creatinine clearance is 12.9 mL/min (by C-G formula based on Cr of 5.31). ------------------------------------------------------------------------------------------------------------------  Recent Labs  03/27/14 1701  HGBA1C 6.9*   ------------------------------------------------------------------------------------------------------------------ No results for input(s): CHOL, HDL, LDLCALC, TRIG, CHOLHDL, LDLDIRECT in the last 72 hours. ------------------------------------------------------------------------------------------------------------------ No results for input(s): TSH, T4TOTAL, T3FREE, THYROIDAB in the last 72 hours.  Invalid input(s): FREET3 ------------------------------------------------------------------------------------------------------------------ No results for input(s): VITAMINB12, FOLATE, FERRITIN, TIBC, IRON, RETICCTPCT in the last 72 hours.  Coagulation profile  Recent Labs Lab 03/27/14 1745  INR 1.01    No results for input(s): DDIMER in the last 72 hours.  Cardiac Enzymes  Recent Labs Lab 03/27/14 1923  TROPONINI <0.30    ------------------------------------------------------------------------------------------------------------------ Invalid input(s): POCBNP     Time Spent in minutes   30 minutes   Akeelah Seppala M.D on 03/29/2014 at 11:44 AM  Between 7am to 7pm - Pager - [phone]  After 7pm go to [email or web address] - password [email or web address]  And look for the night coverage person covering for me after hours  [facility] Office  [phone]   **Disclaimer: This note may have been dictated with voice recognition software. Similar sounding words can inadvertently be transcribed and this note may contain transcription errors which may not have been corrected upon publication of note.**

## 2014-03-29 NOTE — Plan of Care (Signed)
Problem: Phase I Progression Outcomes Goal: OOB as tolerated unless otherwise ordered Outcome: Completed/Met Date Met:  03/29/14     

## 2014-03-29 NOTE — Plan of Care (Signed)
Problem: Phase II Progression Outcomes Goal: Progress activity as tolerated unless otherwise ordered Outcome: Completed/Met Date Met:  03/29/14 Goal: Discharge plan established Outcome: Completed/Met Date Met:  03/29/14 Goal: Tolerating diet Outcome: Completed/Met Date Met:  03/29/14  Problem: Phase III Progression Outcomes Goal: O2 sats > or equal to 93% on room air Outcome: Completed/Met Date Met:  03/29/14 Goal: Activity at appropriate level-compared to baseline (UP IN CHAIR FOR HEMODIALYSIS)  Outcome: Completed/Met Date Met:  03/29/14 Goal: Tolerating diet Outcome: Completed/Met Date Met:  03/29/14

## 2014-03-29 NOTE — Progress Notes (Signed)
Subjective:   Feels well, not as tired or weak. Hopes to go home tomorrow.   Objective Filed Vitals:   03/28/14 1620 03/28/14 1827 03/28/14 2039 03/29/14 0455  BP: 98/57 115/61  105/43  Pulse: 76 92  72  Temp: 97.6 F (36.4 C) 98 F (36.7 C)  98.2 F (36.8 C)  TempSrc: Oral Oral    Resp: 13 15  17   Height:      Weight: 69.7 kg (153 lb 10.6 oz)     SpO2: 98% 94% 97% 98%   Physical Exam General: alert and oriented.  Heart: RRR Lungs: CTA. Unlabored.  Abdomen: soft, nontender +BS Extremities: no edema Dialysis Access:  R AVF +b/t  Dialysis Orders: MWF @ [facility]  74kgs 4 hours 2K/2.25Ca 400/1.5 3000 Heparin R AVF hectorol 84mcg IV/HD Aranesp 40 q wends No Fe  Assessment/Plan: 1. Fever- afebrile x 24 hours. WBC 3.7. Blood  Cultures no growth to date. Chest xray- increased densities in LLL. Vanc and cefepime per primary. Received flu vaccine this year 2. ESRD - MWF @ [facility]. K+ 4. Hd tomorrow, first shift.  3. Hypertension/volume - 105/43 , coreg. 4kgs under edw per wts here 4. Anemia - hgb 9.3, down from 10.1 (10/28)- had been stable in the 10s. Increase ESA and dose today. Denies any bleeding. No Fe- last ferritin 2458 (10/28) 5. Metabolic bone disease - last corrected Ca+ 10.1 and phos 3.8 (was 6.4 9/28) hold phoslo and watch. PTH 215- cont hectorol 6. Nutrition - renal diet. Multivit.  7. DM- per primary 8. Heart transplant- prednisone, imuran and rapamune 9. Hx aspergillosis 2009 - treated by [place], NP [place] [phone] 03/29/2014,9:42 AM  LOS: 2 days   Pt seen, examined and agree w A/P as above. Unclear cause of febrile syndrome, but getting better. Has low plts but this is chronic. Blood cx's negative. LFT's ok. Will check CMV PCR if not done already.   Kelly Splinter MD pager [phone]    cell [phone] 03/29/2014, 11:26 AM    Additional Objective Labs: Basic Metabolic Panel:  Recent Labs Lab  03/27/14 1701 03/27/14 1813 03/28/14 0520 03/29/14 0649  NA 138 134* 135* 140  K 3.8 3.6* 3.7 4.0  CL 94* 98 94* 98  CO2 25  --  23 26  GLUCOSE 116* 115* 144* 127*  BUN 39* 38* 47* 21  CREATININE 8.50* 9.20* 9.58* 5.31*  CALCIUM 9.4  --  8.9 8.9   Liver Function Tests:  Recent Labs Lab 03/27/14 1701  AST 23  ALT 10  ALKPHOS 54  BILITOT 0.3  PROT 6.4  ALBUMIN 2.8*   No results for input(s): LIPASE, AMYLASE in the last 168 hours. CBC:  Recent Labs Lab 03/27/14 1701 03/27/14 1813 03/28/14 0520 03/29/14 0649  WBC 6.7  --  3.2* 3.7*  NEUTROABS 4.8  --   --   --   HGB 9.2* 9.5* 8.3* 9.3*  HCT 28.1* 28.0* 25.1* 28.5*  MCV 98.9  --  97.3 96.9  PLT 121*  --  74* 82*   Blood Culture    Component Value Date/Time   SDES BLOOD LEFT ARM 03/27/2014 1820   SPECREQUEST BOTTLES DRAWN AEROBIC AND ANAEROBIC 10CC 03/27/2014 1820   CULT  03/27/2014 1820           BLOOD CULTURE RECEIVED NO GROWTH TO DATE CULTURE WILL BE HELD FOR 5 DAYS BEFORE ISSUING A FINAL NEGATIVE REPORT Performed at [facility]  REPTSTATUS PENDING 03/27/2014 1820    Cardiac Enzymes:  Recent Labs Lab 03/27/14 1923  TROPONINI <0.30   CBG:  Recent Labs Lab 03/27/14 2137 03/28/14 0725 03/28/14 1717 03/28/14 2019  GLUCAP 204* 131* 121* 178*   Iron Studies: No results for input(s): IRON, TIBC, TRANSFERRIN, FERRITIN in the last 72 hours. @lablastinr3 @ Studies/Results: Dg Chest 2 View  03/27/2014   CLINICAL DATA:  Fatigue for 2 weeks.  Loss of appetite.  Fever.  EXAM: CHEST  2 VIEW  COMPARISON:  07/28/2013 and 07/24/2013  FINDINGS: Two views of the chest again demonstrate vascular stents in the upper chest bilaterally. Again noted are patchy densities in the left lower lobe. These densities are more prominent than the previous examinations and could represent acute on chronic disease. Upper lungs are clear. Patient has median sternotomy wires. Atherosclerotic calcifications at the aortic  arch. Heart size is stable. No significant pleural effusions.  IMPRESSION: Increased patchy densities in the left lower lobe. Findings suggest acute on chronic disease.   Electronically Signed   By: Markus Daft M.D.   On: 03/27/2014 18:43   Medications:   . aspirin EC  81 mg Oral Daily  . atorvastatin  40 mg Oral Daily  . azaTHIOprine  75 mg Oral q morning - 10a  . carvedilol  25 mg Oral QHS  . ceFEPime (MAXIPIME) IV  2 g Intravenous Q M,W,F-2000  . colesevelam  3,750 mg Oral Daily  . darbepoetin (ARANESP) injection - DIALYSIS  100 mcg Intravenous Q Mon-HD  . doxercalciferol  2 mcg Intravenous Q M,W,F-HD  . heparin  5,000 Units Subcutaneous 3 times per day  . insulin aspart  0-5 Units Subcutaneous QHS  . insulin aspart  0-9 Units Subcutaneous TID WC  . insulin glargine  20 Units Subcutaneous q morning - 10a  . ipratropium-albuterol  3 mL Nebulization BID  . isosorbide dinitrate  40 mg Oral TID  . multivitamin  1 tablet Oral QHS  . predniSONE  10 mg Oral Q breakfast  . sirolimus  2 mg Oral Daily  . sodium chloride  3 mL Intravenous Q12H  . vancomycin  750 mg Intravenous Q M,W,F-HD

## 2014-03-29 NOTE — Plan of Care (Signed)
Problem: Phase I Progression Outcomes Goal: Pain controlled with appropriate interventions Outcome: Completed/Met Date Met:  03/29/14     

## 2014-03-29 NOTE — Plan of Care (Signed)
Problem: Phase I Progression Outcomes Goal: Voiding-avoid urinary catheter unless indicated Outcome: Completed/Met Date Met:  03/29/14

## 2014-03-29 NOTE — Plan of Care (Signed)
Problem: Phase II Progression Outcomes Goal: Wean O2 if indicated Outcome: Completed/Met Date Met:  03/29/14

## 2014-03-29 NOTE — Plan of Care (Signed)
Problem: Phase I Progression Outcomes Goal: Dyspnea controlled at rest Outcome: Completed/Met Date Met:  03/29/14     

## 2014-03-30 DIAGNOSIS — A419 Sepsis, unspecified organism: Principal | ICD-10-CM | POA: Diagnosis present

## 2014-03-30 LAB — BASIC METABOLIC PANEL
Anion gap: 21 — ABNORMAL HIGH (ref 5–15)
BUN: 37 mg/dL — AB (ref 6–23)
CO2: 22 meq/L (ref 19–32)
Calcium: 8.9 mg/dL (ref 8.4–10.5)
Chloride: 98 mEq/L (ref 96–112)
Creatinine, Ser: 7.8 mg/dL — ABNORMAL HIGH (ref 0.50–1.35)
GFR calc Af Amer: 7 mL/min — ABNORMAL LOW (ref >90)
GFR, EST NON AFRICAN AMERICAN: 6 mL/min — AB (ref >90)
Glucose, Bld: 101 mg/dL — ABNORMAL HIGH (ref 70–99)
Potassium: 3.7 mEq/L (ref 3.7–5.3)
Sodium: 141 mEq/L (ref 137–147)

## 2014-03-30 LAB — CBC
HEMATOCRIT: 26.1 % — AB (ref 39.0–52.0)
HEMOGLOBIN: 8.4 g/dL — AB (ref 13.0–17.0)
MCH: 31 pg (ref 26.0–34.0)
MCHC: 32.2 g/dL (ref 30.0–36.0)
MCV: 96.3 fL (ref 78.0–100.0)
Platelets: 74 10*3/uL — ABNORMAL LOW (ref 150–400)
RBC: 2.71 MIL/uL — AB (ref 4.22–5.81)
RDW: 14.3 % (ref 11.5–15.5)
WBC: 3.1 10*3/uL — ABNORMAL LOW (ref 4.0–10.5)

## 2014-03-30 LAB — GLUCOSE, CAPILLARY
GLUCOSE-CAPILLARY: 98 mg/dL (ref 70–99)
GLUCOSE-CAPILLARY: 86 mg/dL (ref 70–99)
Glucose-Capillary: 93 mg/dL (ref 70–99)

## 2014-03-30 MED ORDER — HEPARIN SODIUM (PORCINE) 1000 UNIT/ML DIALYSIS: 3000.0000 [IU] | Freq: Once | INTRAMUSCULAR | Status: DC

## 2014-03-30 MED ORDER — NEPRO/CARBSTEADY PO LIQD
237.0000 mL | ORAL | Status: DC | PRN
  Filled 2014-03-30: qty 237

## 2014-03-30 MED ORDER — CEFUROXIME AXETIL 500 MG PO TABS: 500.0000 mg | ORAL_TABLET | Freq: Every day | ORAL | Status: DC

## 2014-03-30 MED ORDER — SODIUM CHLORIDE 0.9 % IV SOLN: 100.0000 mL | INTRAVENOUS | Status: DC | PRN

## 2014-03-30 MED ORDER — ALTEPLASE 2 MG IJ SOLR
2.0000 mg | Freq: Once | INTRAMUSCULAR | Status: DC | PRN
  Filled 2014-03-30: qty 2

## 2014-03-30 MED ORDER — DOXYCYCLINE HYCLATE 100 MG PO CAPS: 100.0000 mg | ORAL_CAPSULE | Freq: Two times a day (BID) | ORAL | Status: DC

## 2014-03-30 MED ORDER — ONDANSETRON HCL 4 MG/2ML IJ SOLN: 4.0000 mg | Freq: Once | INTRAMUSCULAR | Status: DC

## 2014-03-30 MED ORDER — DOXERCALCIFEROL 4 MCG/2ML IV SOLN
INTRAVENOUS | Status: AC
  Filled 2014-03-30: qty 2

## 2014-03-30 MED ORDER — LIDOCAINE HCL (PF) 1 % IJ SOLN: 5.0000 mL | INTRAMUSCULAR | Status: DC | PRN

## 2014-03-30 MED ORDER — HEPARIN SODIUM (PORCINE) 1000 UNIT/ML DIALYSIS: 1000.0000 [IU] | INTRAMUSCULAR | Status: DC | PRN

## 2014-03-30 MED ORDER — LIDOCAINE-PRILOCAINE 2.5-2.5 % EX CREA
1.0000 "application " | TOPICAL_CREAM | CUTANEOUS | Status: DC | PRN
  Filled 2014-03-30: qty 5

## 2014-03-30 MED ORDER — PENTAFLUOROPROP-TETRAFLUOROETH EX AERO: 1.0000 "application " | INHALATION_SPRAY | CUTANEOUS | Status: DC | PRN

## 2014-03-30 MED ORDER — SODIUM CHLORIDE 0.9 % IV BOLUS (SEPSIS)
250.0000 mL | Freq: Once | INTRAVENOUS | Status: AC
  Administered 2014-03-30: 250 mL via INTRAVENOUS

## 2014-03-30 NOTE — Procedures (Signed)
I was present at this dialysis session, have reviewed the session itself and made  appropriate changes  Kelly Splinter MD (pgr) [phone]    ([phone] 03/30/2014, 10:43 AM

## 2014-03-30 NOTE — Discharge Summary (Signed)
Physician Discharge Summary  Patient ID: JUNO BOZARD MRN: [id number] DOB/AGE: May 03, 1946 68 y.o.  Admit date: 03/27/2014 Discharge date: 03/30/2014  Primary Care Physician:  [name], MD  Discharge Diagnoses:    . Fever . ESRD on dialysis . Left lower lobe pneumonia . Sepsis  Consults: Nephrology  Recommendations for Outpatient Follow-up:    Allergies:   Allergies  Allergen Reactions  . Lisinopril Swelling    Lips and tongue swell  . Norvasc [Amlodipine Besylate] Rash    Flushing  . Penicillins Rash     Discharge Medications:   Medication List    STOP taking these medications        tacrolimus 0.5 MG capsule  Commonly known as:  PROGRAF     tacrolimus 1 MG capsule  Commonly known as:  PROGRAF      TAKE these medications        aspirin EC 81 MG tablet  Take 81 mg by mouth daily.     atorvastatin 40 MG tablet  Commonly known as:  LIPITOR  Take 40 mg by mouth daily.     azaTHIOprine 50 MG tablet  Commonly known as:  IMURAN  Take 75 mg by mouth every morning.     calcium acetate 667 MG capsule  Commonly known as:  PHOSLO  Take 667 mg by mouth 3 (three) times daily with meals.     carvedilol 25 MG tablet  Commonly known as:  COREG  Take 25 mg by mouth at bedtime.     cefUROXime 500 MG tablet  Commonly known as:  CEFTIN  Take 1 tablet (500 mg total) by mouth daily. X 7days     colesevelam 625 MG tablet  Commonly known as:  WELCHOL  Take 3,750 mg by mouth daily. Takes 6 tabs daily     doxycycline 100 MG capsule  Commonly known as:  VIBRAMYCIN  Take 1 capsule (100 mg total) by mouth 2 (two) times daily. X 7days     HUMALOG KWIKPEN 100 UNIT/ML KiwkPen  Generic drug:  insulin lispro  Inject 2-8 Units into the skin 2 (two) times daily. Per sliding scale     insulin glargine 100 UNIT/ML injection  Commonly known as:  LANTUS  Inject 20 Units into the skin every morning.     isosorbide dinitrate 40 MG tablet  Commonly known as:   ISORDIL  Take 40 mg by mouth 3 (three) times daily.     predniSONE 10 MG tablet  Commonly known as:  DELTASONE  Take 10 mg by mouth daily.     sirolimus 2 MG tablet  Commonly known as:  RAPAMUNE  Take 2 mg by mouth daily.         Brief H and P: For complete details please refer to admission H and P, but in brief patient is a 68 year old male with history of cardiac transplant on immunosuppressive medications, CAD, and the diabetes, ESRD on hemodialysis presented with 2 weeks history of weakness, nausea and vomiting. Patient had a fever of 102 in the ED. He otherwise denied any chest pain or shortness of breath, complained of a dry cough.  Hospital Course:  Fever with left lung pneumonia, sepsis/viral syndrome Patient was admitted, given his immunocompromise status, patient was placed on broad-spectrum IV antibiotics. Blood cultures had shown no growth. Patient is allergic to penicillins and QTC is in 500's, hence patient was placed on doxycycline and Ceftin for 7 more days. He has remained afebrile and no leukocytosis.  ESRD Nephrology was consulted and patient was continued on hemodialysis MWF, patient is receiving hemodialysis prior to DC.  Cardiac/renal transplant patient He was continued on immunosuppressants  Hyperlipidemia: patient was continued on statin  Thrombocytopenia Most likely related to immunosuppressant medications, no evidence of bleeding   Day of Discharge BP 139/63 mmHg  Pulse 97  Temp(Src) 97.8 F (36.6 C) (Oral)  Resp 16  Ht 6' (1.829 m)  Wt 69.8 kg (153 lb 14.1 oz)  BMI 20.87 kg/m2  SpO2 94%  Physical Exam: General: Alert and awake oriented x3 not in any acute distress. CVS: S1-S2 clear no murmur rubs or gallops Chest: clear to auscultation bilaterally, no wheezing rales or rhonchi Abdomen: soft nontender, nondistended, normal bowel sounds Extremities: no cyanosis, clubbing or edema noted bilaterally Neuro: Cranial nerves II-XII intact, no  focal neurological deficits   The results of significant diagnostics from this hospitalization (including imaging, microbiology, ancillary and laboratory) are listed below for reference.    LAB RESULTS: Basic Metabolic Panel:  Recent Labs Lab 03/29/14 0649 03/30/14 0500  NA 140 141  K 4.0 3.7  CL 98 98  CO2 26 22  GLUCOSE 127* 101*  BUN 21 37*  CREATININE 5.31* 7.80*  CALCIUM 8.9 8.9   Liver Function Tests:  Recent Labs Lab 03/27/14 1701  AST 23  ALT 10  ALKPHOS 54  BILITOT 0.3  PROT 6.4  ALBUMIN 2.8*   No results for input(s): LIPASE, AMYLASE in the last 168 hours. No results for input(s): AMMONIA in the last 168 hours. CBC:  Recent Labs Lab 03/27/14 1701  03/29/14 0649 03/30/14 0500  WBC 6.7  < > 3.7* 3.1*  NEUTROABS 4.8  --   --   --   HGB 9.2*  < > 9.3* 8.4*  HCT 28.1*  < > 28.5* 26.1*  MCV 98.9  < > 96.9 96.3  PLT 121*  < > 82* 74*  < > = values in this interval not displayed. Cardiac Enzymes:  Recent Labs Lab 03/27/14 1923  TROPONINI <0.30   BNP: Invalid input(s): POCBNP CBG:  Recent Labs Lab 03/29/14 1619 03/29/14 2112  GLUCAP 173* 171*    Significant Diagnostic Studies:  Dg Chest 2 View  03/27/2014   CLINICAL DATA:  Fatigue for 2 weeks.  Loss of appetite.  Fever.  EXAM: CHEST  2 VIEW  COMPARISON:  07/28/2013 and 07/24/2013  FINDINGS: Two views of the chest again demonstrate vascular stents in the upper chest bilaterally. Again noted are patchy densities in the left lower lobe. These densities are more prominent than the previous examinations and could represent acute on chronic disease. Upper lungs are clear. Patient has median sternotomy wires. Atherosclerotic calcifications at the aortic arch. Heart size is stable. No significant pleural effusions.  IMPRESSION: Increased patchy densities in the left lower lobe. Findings suggest acute on chronic disease.   Electronically Signed   By: Markus Daft M.D.   On: 03/27/2014 18:43        Disposition and Follow-up: Discharge Instructions    Diet Carb Modified    Complete by:  As directed      Increase activity slowly    Complete by:  As directed             DISPOSITIONhome  DIET: carb modified diet    DISCHARGE FOLLOW-UP Follow-up Information    Follow up with [name],[name], MD. Schedule an appointment as soon as possible for a visit in 10 days.   Specialty:  Family  Medicine   Why:  for hospital follow-up   Contact information:   [place] [address] [phone]       Time spent on Discharge: 45 mins  Signed:   RAI,RIPUDEEP M.D. [facility] 03/30/2014, 11:54 AM Pager: [phone]

## 2014-03-30 NOTE — Plan of Care (Signed)
Problem: Phase III Progression Outcomes Goal: Pain controlled on oral analgesia Outcome: Progressing     

## 2014-03-30 NOTE — Plan of Care (Signed)
Problem: Phase I Progression Outcomes Goal: Initial discharge plan identified Outcome: Completed/Met Date Met:  03/30/14     

## 2014-03-31 LAB — BASIC METABOLIC PANEL
Anion gap: 18 — ABNORMAL HIGH (ref 5–15)
BUN: 22 mg/dL (ref 6–23)
CALCIUM: 9.3 mg/dL (ref 8.4–10.5)
CO2: 25 meq/L (ref 19–32)
CREATININE: 5.23 mg/dL — AB (ref 0.50–1.35)
Chloride: 97 mEq/L (ref 96–112)
GFR, EST AFRICAN AMERICAN: 12 mL/min — AB (ref >90)
GFR, EST NON AFRICAN AMERICAN: 10 mL/min — AB (ref >90)
Glucose, Bld: 77 mg/dL (ref 70–99)
Potassium: 4.2 mEq/L (ref 3.7–5.3)
SODIUM: 140 meq/L (ref 137–147)

## 2014-03-31 LAB — GLUCOSE, CAPILLARY
GLUCOSE-CAPILLARY: 233 mg/dL — AB (ref 70–99)
Glucose-Capillary: 63 mg/dL — ABNORMAL LOW (ref 70–99)
Glucose-Capillary: 90 mg/dL (ref 70–99)

## 2014-03-31 LAB — CBC
HCT: 30 % — ABNORMAL LOW (ref 39.0–52.0)
Hemoglobin: 9.7 g/dL — ABNORMAL LOW (ref 13.0–17.0)
MCH: 31.5 pg (ref 26.0–34.0)
MCHC: 32.3 g/dL (ref 30.0–36.0)
MCV: 97.4 fL (ref 78.0–100.0)
PLATELETS: 101 10*3/uL — AB (ref 150–400)
RBC: 3.08 MIL/uL — ABNORMAL LOW (ref 4.22–5.81)
RDW: 14.6 % (ref 11.5–15.5)
WBC: 5.5 10*3/uL (ref 4.0–10.5)

## 2014-03-31 MED ORDER — DEXTROSE 5 % IV SOLN: 2.0000 g | INTRAVENOUS | Status: DC

## 2014-03-31 MED ORDER — VANCOMYCIN HCL IN DEXTROSE 750-5 MG/150ML-% IV SOLN: 750.0000 mg | INTRAVENOUS | Status: DC

## 2014-03-31 NOTE — Discharge Summary (Signed)
Physician Discharge Summary  Patient ID: Brandon Sharp MRN: [id number] DOB/AGE: [birth year] 68 y.o.  Admit date: 03/27/2014 Discharge date: 03/31/2014  Primary Care Physician:  [name], MD  Discharge Diagnoses:    . Fever . ESRD on dialysis . Left lower lobe pneumonia . Sepsis  Consults: Nephrology  Recommendations for Outpatient Follow-up:    Allergies:   Allergies  Allergen Reactions  . Lisinopril Swelling    Lips and tongue swell  . Norvasc [Amlodipine Besylate] Rash    Flushing  . Penicillins Rash     Discharge Medications:   Medication List    STOP taking these medications        tacrolimus 0.5 MG capsule  Commonly known as:  PROGRAF     tacrolimus 1 MG capsule  Commonly known as:  PROGRAF      TAKE these medications        aspirin EC 81 MG tablet  Take 81 mg by mouth daily.     atorvastatin 40 MG tablet  Commonly known as:  LIPITOR  Take 40 mg by mouth daily.     azaTHIOprine 50 MG tablet  Commonly known as:  IMURAN  Take 75 mg by mouth every morning.     calcium acetate 667 MG capsule  Commonly known as:  PHOSLO  Take 667 mg by mouth 3 (three) times daily with meals.     carvedilol 25 MG tablet  Commonly known as:  COREG  Take 25 mg by mouth at bedtime.     cefTAZidime 2 g in dextrose 5 % 50 mL  Inject 2 g into the vein every Monday, Wednesday, and Friday with hemodialysis. X 1 week  Start taking on:  04/01/2014     colesevelam 625 MG tablet  Commonly known as:  WELCHOL  Take 3,750 mg by mouth daily. Takes 6 tabs daily     HUMALOG KWIKPEN 100 UNIT/ML KiwkPen  Generic drug:  insulin lispro  Inject 2-8 Units into the skin 2 (two) times daily. Per sliding scale     insulin glargine 100 UNIT/ML injection  Commonly known as:  LANTUS  Inject 20 Units into the skin every morning.     isosorbide dinitrate 40 MG tablet  Commonly known as:  ISORDIL  Take 40 mg by mouth 3 (three) times daily.     predniSONE 10 MG tablet   Commonly known as:  DELTASONE  Take 10 mg by mouth daily.     sirolimus 2 MG tablet  Commonly known as:  RAPAMUNE  Take 2 mg by mouth daily.     Vancomycin 750 MG/150ML Soln  Commonly known as:  VANCOCIN  Inject 150 mLs (750 mg total) into the vein every Monday, Wednesday, and Friday with hemodialysis. 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 Patient had a fever of 102 in the ED. He otherwise denied any chest pain or shortness of breath, complained of a dry cough.  Hospital Course:  Fever with left lung pneumonia, sepsis/viral syndrome Patient was admitted, given his immunocompromise status, patient was placed on broad-spectrum IV antibiotics. Blood cultures had shown no growth. Patient was initially planning to be discharged on 11/4 however after hemodialysis, patient spiked fever  102. Blood cultures were sent, has been negative so far. After discussion with nephrology, patient will continue IV vancomycin and Fortaz for 1 week with dialysis after discharge, nephrology will follow up on blood cultures. Patient feels back to his baseline and wants to be discharged home.  ESRD Nephrology was consulted and patient was continued on hemodialysis MWF, patient will continue hemodialysis and antibiotics  Cardiac/renal transplant patient He was continued on immunosuppressants  Hyperlipidemia: patient was continued on statin  Thrombocytopenia Most likely related to immunosuppressant medications, no evidence of bleeding   Day of Discharge BP 94/51 mmHg  Pulse 83  Temp(Src) 98.3 F (36.8 C) (Oral)  Resp 20  Ht 6' (1.829 m)  Wt 69.8 kg (153 lb 14.1 oz)  BMI 20.87 kg/m2  SpO2 99%  Physical Exam: General: Alert and  awake oriented x3 not in any acute distress. CVS: S1-S2 clear no murmur rubs or gallops Chest: clear to auscultation bilaterally, no wheezing rales or rhonchi Abdomen: soft nontender, nondistended, normal bowel sounds Extremities: no cyanosis, clubbing or edema noted bilaterally Neuro: Cranial nerves II-XII intact, no focal neurological deficits   The results of significant diagnostics from this hospitalization (including imaging, microbiology, ancillary and laboratory) are listed below for reference.    LAB RESULTS: Basic Metabolic Panel:  Recent Labs Lab 03/30/14 0500 03/31/14 0500  NA 141 140  K 3.7 4.2  CL 98 97  CO2 22 25  GLUCOSE 101* 77  BUN 37* 22  CREATININE 7.80* 5.23*  CALCIUM 8.9 9.3   Liver Function Tests:  Recent Labs Lab 03/27/14 1701  AST 23  ALT 10  ALKPHOS 54  BILITOT 0.3  PROT 6.4  ALBUMIN 2.8*   No results for input(s): LIPASE, AMYLASE in the last 168 hours. No results for input(s): AMMONIA in the last 168 hours. CBC:  Recent Labs Lab 03/27/14 1701  03/30/14 0500 03/31/14 0500  WBC 6.7  < > 3.1* 5.5  NEUTROABS 4.8  --   --   --   HGB 9.2*  < > 8.4* 9.7*  HCT 28.1*  < > 26.1* 30.0*  MCV 98.9  < > 96.3 97.4  PLT 121*  < > 74* 101*  < > = values in this interval not displayed. Cardiac Enzymes:  Recent Labs Lab 03/27/14 1923  TROPONINI <0.30   BNP: Invalid input(s): POCBNP CBG:  Recent Labs Lab 03/31/14 0904 03/31/14 1137  GLUCAP 90 233*    Significant Diagnostic Studies:  Dg Chest 2 View  03/27/2014   CLINICAL DATA:  Fatigue for 2 weeks.  Loss of appetite.  Fever.  EXAM: CHEST  2 VIEW  COMPARISON:  07/28/2013 and 07/24/2013  FINDINGS: Two views of the chest again demonstrate vascular stents in the upper chest bilaterally. Again noted are patchy densities in the left lower lobe. These densities are more prominent than the previous examinations and could represent acute on chronic disease. Upper lungs are clear. Patient has  median sternotomy wires. Atherosclerotic calcifications at the aortic arch. Heart size is stable. No significant pleural effusions.  IMPRESSION: Increased patchy densities in the left lower lobe. Findings suggest acute on chronic disease.   Electronically Signed   By: Markus Daft M.D.   On: 03/27/2014 18:43       Disposition and Follow-up: Discharge Instructions    Diet Carb Modified    Complete by:  As directed      Increase activity slowly    Complete by:  As directed  DISPOSITIONhome  DIET: carb modified diet    DISCHARGE FOLLOW-UP Follow-up Information    Follow up with [name],[name], MD. Schedule an appointment as soon as possible for a visit in 10 days.   Specialty:  Family Medicine   Why:  for hospital follow-up   Contact information:   [place] [address] [phone]       Time spent on Discharge: 28 mins  Signed:   RAI,RIPUDEEP M.D. [facility] 03/31/2014, 12:01 PM Pager: [phone]

## 2014-03-31 NOTE — Progress Notes (Signed)
Patient's CBG was 63 while eating breakfast. He was asymptomatic. Rechecked 15 minutes later and CBG was 90. Will continue to monitor. Tyna Jaksch, RN

## 2014-03-31 NOTE — Progress Notes (Signed)
ANTIBIOTIC CONSULT NOTE -follow up Pharmacy Consult for ceftazidime and vancomycin Indication: rule out sepsis  Allergies  Allergen Reactions  . Lisinopril Swelling    Lips and tongue swell  . Norvasc [Amlodipine Besylate] Rash    Flushing  . Penicillins Rash    Patient Measurements: Height: 6' (182.9 cm) Weight: 153 lb 14.1 oz (69.8 kg) (standing) IBW/kg (Calculated) : 77.6 Dry weight per pt = 74 kg  Vital Signs: Temp: 98.3 F (36.8 C) (11/05 0604) Temp Source: Oral (11/05 0604) BP: 94/51 mmHg (11/05 0604) Pulse Rate: 83 (11/05 0604) Intake/Output from previous day: 11/04 0701 - 11/05 0700 In: 360 [P.O.:360] Out: 2317  Intake/Output from this shift:    Labs:  Recent Labs  03/29/14 0649 03/30/14 0500 03/31/14 0500  WBC 3.7* 3.1* 5.5  HGB 9.3* 8.4* 9.7*  PLT 82* 74* 101*  CREATININE 5.31* 7.80* 5.23*   Estimated Creatinine Clearance: 13.2 mL/min (by C-G formula based on Cr of 5.23). No results for input(s): VANCOTROUGH, VANCOPEAK, VANCORANDOM, GENTTROUGH, GENTPEAK, GENTRANDOM, TOBRATROUGH, TOBRAPEAK, TOBRARND, AMIKACINPEAK, AMIKACINTROU, AMIKACIN in the last 72 hours.   Microbiology: Recent Results (from the past 720 hour(s))  Blood culture (routine x 2)     Status: None (Preliminary result)   Collection Time: 03/27/14  5:51 PM  Result Value Ref Range Status   Specimen Description BLOOD LEFT ARM  Final   Special Requests BOTTLES DRAWN AEROBIC AND ANAEROBIC 5CC  Final   Culture  Setup Time   Final    03/28/2014 02:25 Performed at [facility]    Culture   Final           BLOOD CULTURE RECEIVED NO GROWTH TO DATE CULTURE WILL BE HELD FOR 5 DAYS BEFORE ISSUING A FINAL NEGATIVE REPORT Performed at [facility]    Report Status PENDING  Incomplete  Blood culture (routine x 2)     Status: None (Preliminary result)   Collection Time: 03/27/14  6:20 PM  Result Value Ref Range Status   Specimen Description BLOOD LEFT ARM  Final   Special  Requests BOTTLES DRAWN AEROBIC AND ANAEROBIC 10CC  Final   Culture  Setup Time   Final    03/28/2014 02:25 Performed at [facility]    Culture   Final           BLOOD CULTURE RECEIVED NO GROWTH TO DATE CULTURE WILL BE HELD FOR 5 DAYS BEFORE ISSUING A FINAL NEGATIVE REPORT Performed at [facility]    Report Status PENDING  Incomplete  Culture, blood (routine x 2)     Status: None (Preliminary result)   Collection Time: 03/30/14  5:00 PM  Result Value Ref Range Status   Specimen Description BLOOD LEFT HAND  Final   Special Requests BOTTLES DRAWN AEROBIC ONLY 2CC  Final   Culture  Setup Time   Final    03/30/2014 22:26 Performed at [facility]    Culture   Final           BLOOD CULTURE RECEIVED NO GROWTH TO DATE CULTURE WILL BE HELD FOR 5 DAYS BEFORE ISSUING A FINAL NEGATIVE REPORT Performed at [facility]    Report Status PENDING  Incomplete  Culture, blood (routine x 2)     Status: None (Preliminary result)   Collection Time: 03/30/14  5:06 PM  Result Value Ref Range Status   Specimen Description BLOOD LEFT ARM  Final   Special Requests BOTTLES DRAWN AEROBIC ONLY 3.5CC  Final  Culture  Setup Time   Final    03/30/2014 22:20 Performed at [facility]    Culture   Final           BLOOD CULTURE RECEIVED NO GROWTH TO DATE CULTURE WILL BE HELD FOR 5 DAYS BEFORE ISSUING A FINAL NEGATIVE REPORT Performed at [facility]    Report Status PENDING  Incomplete      Assessment: 68 yo M in ED with generalized weakness and fatigue x 2 weeks.  Dry weight per pt is 74 kg.  Pt ESRD on HD MWF.  Pharmacy consulted to dose cefepime and vancomycin for r/o sepsis on 11/1.  Now to transition to ceftazidime and continue vancomycin at HD center for LLL PNA.   Goal of Therapy:  Vancomycin pre-HD level of 15-25 mcg/ml  Plan:  -dc cefepime -ceftazidime 2 gm after each HD on MWF -continue vancomycin 750 mg IV after each HD on MWF  Eudelia Bunch, Pharm.D. [phone] 03/31/2014 10:41 AM

## 2014-04-04 LAB — CULTURE, BLOOD (ROUTINE X 2)
CULTURE: NO GROWTH
CULTURE: NO GROWTH

## 2014-04-05 LAB — CULTURE, BLOOD (ROUTINE X 2)
CULTURE: NO GROWTH
Culture: NO GROWTH

## 2014-04-06 ENCOUNTER — Emergency Department (HOSPITAL_COMMUNITY): Payer: Medicare Other

## 2014-04-06 ENCOUNTER — Encounter (HOSPITAL_COMMUNITY): Payer: Self-pay

## 2014-04-06 ENCOUNTER — Inpatient Hospital Stay (HOSPITAL_COMMUNITY)
Admission: EM | Admit: 2014-04-06 | Discharge: 2014-04-10 | DRG: 193 | Disposition: A | Discharge status: Home / Self Care | Payer: Medicare Other | Attending: Internal Medicine | Admitting: Internal Medicine

## 2014-04-06 DIAGNOSIS — Z992 Dependence on renal dialysis: Secondary | ICD-10-CM | POA: Diagnosis not present

## 2014-04-06 DIAGNOSIS — Z951 Presence of aortocoronary bypass graft: Secondary | ICD-10-CM

## 2014-04-06 DIAGNOSIS — Z87891 Personal history of nicotine dependence: Secondary | ICD-10-CM | POA: Diagnosis not present

## 2014-04-06 DIAGNOSIS — D649 Anemia, unspecified: Secondary | ICD-10-CM | POA: Diagnosis present

## 2014-04-06 DIAGNOSIS — E785 Hyperlipidemia, unspecified: Secondary | ICD-10-CM | POA: Diagnosis present

## 2014-04-06 DIAGNOSIS — Z86718 Personal history of other venous thrombosis and embolism: Secondary | ICD-10-CM | POA: Diagnosis not present

## 2014-04-06 DIAGNOSIS — I739 Peripheral vascular disease, unspecified: Secondary | ICD-10-CM | POA: Diagnosis present

## 2014-04-06 DIAGNOSIS — Z7952 Long term (current) use of systemic steroids: Secondary | ICD-10-CM | POA: Diagnosis not present

## 2014-04-06 DIAGNOSIS — N185 Chronic kidney disease, stage 5: Secondary | ICD-10-CM | POA: Diagnosis present

## 2014-04-06 DIAGNOSIS — I120 Hypertensive chronic kidney disease with stage 5 chronic kidney disease or end stage renal disease: Secondary | ICD-10-CM | POA: Diagnosis present

## 2014-04-06 DIAGNOSIS — N186 End stage renal disease: Secondary | ICD-10-CM | POA: Diagnosis present

## 2014-04-06 DIAGNOSIS — J189 Pneumonia, unspecified organism: Principal | ICD-10-CM | POA: Diagnosis present

## 2014-04-06 DIAGNOSIS — E2740 Unspecified adrenocortical insufficiency: Secondary | ICD-10-CM | POA: Diagnosis present

## 2014-04-06 DIAGNOSIS — Z941 Heart transplant status: Secondary | ICD-10-CM

## 2014-04-06 DIAGNOSIS — I25811 Atherosclerosis of native coronary artery of transplanted heart without angina pectoris: Secondary | ICD-10-CM | POA: Diagnosis present

## 2014-04-06 DIAGNOSIS — T8612 Kidney transplant failure: Secondary | ICD-10-CM | POA: Insufficient documentation

## 2014-04-06 DIAGNOSIS — R509 Fever, unspecified: Secondary | ICD-10-CM | POA: Diagnosis present

## 2014-04-06 DIAGNOSIS — Z7982 Long term (current) use of aspirin: Secondary | ICD-10-CM

## 2014-04-06 DIAGNOSIS — H43399 Other vitreous opacities, unspecified eye: Secondary | ICD-10-CM | POA: Insufficient documentation

## 2014-04-06 DIAGNOSIS — Z955 Presence of coronary angioplasty implant and graft: Secondary | ICD-10-CM

## 2014-04-06 DIAGNOSIS — E876 Hypokalemia: Secondary | ICD-10-CM | POA: Diagnosis not present

## 2014-04-06 DIAGNOSIS — Z794 Long term (current) use of insulin: Secondary | ICD-10-CM | POA: Diagnosis not present

## 2014-04-06 DIAGNOSIS — N2581 Secondary hyperparathyroidism of renal origin: Secondary | ICD-10-CM | POA: Diagnosis present

## 2014-04-06 DIAGNOSIS — E119 Type 2 diabetes mellitus without complications: Secondary | ICD-10-CM | POA: Diagnosis present

## 2014-04-06 DIAGNOSIS — D899 Disorder involving the immune mechanism, unspecified: Secondary | ICD-10-CM | POA: Diagnosis present

## 2014-04-06 DIAGNOSIS — E1129 Type 2 diabetes mellitus with other diabetic kidney complication: Secondary | ICD-10-CM | POA: Diagnosis present

## 2014-04-06 DIAGNOSIS — Y830 Surgical operation with transplant of whole organ as the cause of abnormal reaction of the patient, or of later complication, without mention of misadventure at the time of the procedure: Secondary | ICD-10-CM | POA: Diagnosis present

## 2014-04-06 DIAGNOSIS — D849 Immunodeficiency, unspecified: Secondary | ICD-10-CM

## 2014-04-06 DIAGNOSIS — I6529 Occlusion and stenosis of unspecified carotid artery: Secondary | ICD-10-CM | POA: Diagnosis present

## 2014-04-06 LAB — I-STAT CG4 LACTIC ACID, ED: Lactic Acid, Venous: 3.34 mmol/L — ABNORMAL HIGH (ref 0.5–2.2)

## 2014-04-06 LAB — BASIC METABOLIC PANEL
ANION GAP: 21 — AB (ref 5–15)
BUN: 16 mg/dL (ref 6–23)
CHLORIDE: 90 meq/L — AB (ref 96–112)
CO2: 28 mEq/L (ref 19–32)
Calcium: 9.3 mg/dL (ref 8.4–10.5)
Creatinine, Ser: 3.9 mg/dL — ABNORMAL HIGH (ref 0.50–1.35)
GFR calc Af Amer: 17 mL/min — ABNORMAL LOW (ref >90)
GFR calc non Af Amer: 14 mL/min — ABNORMAL LOW (ref >90)
Glucose, Bld: 75 mg/dL (ref 70–99)
Potassium: 3.5 mEq/L — ABNORMAL LOW (ref 3.7–5.3)
Sodium: 139 mEq/L (ref 137–147)

## 2014-04-06 LAB — CBC
HCT: 31.4 % — ABNORMAL LOW (ref 39.0–52.0)
Hemoglobin: 10.3 g/dL — ABNORMAL LOW (ref 13.0–17.0)
MCH: 32 pg (ref 26.0–34.0)
MCHC: 32.8 g/dL (ref 30.0–36.0)
MCV: 97.5 fL (ref 78.0–100.0)
Platelets: 125 10*3/uL — ABNORMAL LOW (ref 150–400)
RBC: 3.22 MIL/uL — ABNORMAL LOW (ref 4.22–5.81)
RDW: 14.9 % (ref 11.5–15.5)
WBC: 5.2 10*3/uL (ref 4.0–10.5)

## 2014-04-06 LAB — HEPATIC FUNCTION PANEL
ALT: 17 U/L (ref 0–53)
AST: 47 U/L — ABNORMAL HIGH (ref 0–37)
Albumin: 2.6 g/dL — ABNORMAL LOW (ref 3.5–5.2)
Alkaline Phosphatase: 62 U/L (ref 39–117)
Total Bilirubin: 0.3 mg/dL (ref 0.3–1.2)
Total Protein: 7.1 g/dL (ref 6.0–8.3)

## 2014-04-06 LAB — CBG MONITORING, ED: Glucose-Capillary: 69 mg/dL — ABNORMAL LOW (ref 70–99)

## 2014-04-06 LAB — I-STAT TROPONIN, ED: Troponin i, poc: 0.14 ng/mL (ref 0.00–0.08)

## 2014-04-06 LAB — TROPONIN I: Troponin I: <0.3 ng/mL (ref <0.30)

## 2014-04-06 MED ORDER — VANCOMYCIN HCL IN DEXTROSE 1-5 GM/200ML-% IV SOLN
1000.0000 mg | Freq: Once | INTRAVENOUS | Status: AC
  Administered 2014-04-06: 1000 mg via INTRAVENOUS
  Filled 2014-04-06: qty 200

## 2014-04-06 MED ORDER — ACETAMINOPHEN 325 MG PO TABS
650.0000 mg | ORAL_TABLET | Freq: Four times a day (QID) | ORAL | Status: DC | PRN
  Administered 2014-04-06: 650 mg via ORAL
  Filled 2014-04-06: qty 2

## 2014-04-06 MED ORDER — SODIUM CHLORIDE 0.9 % IV BOLUS (SEPSIS)
500.0000 mL | Freq: Once | INTRAVENOUS | Status: AC
  Administered 2014-04-06: 500 mL via INTRAVENOUS

## 2014-04-06 MED ORDER — PIPERACILLIN-TAZOBACTAM 3.375 G IVPB 30 MIN
3.3750 g | Freq: Once | INTRAVENOUS | Status: AC
  Administered 2014-04-06: 3.375 g via INTRAVENOUS
  Filled 2014-04-06: qty 50

## 2014-04-06 NOTE — ED Notes (Signed)
MD made aware of patient's temperature, HR, and Altered Mental Status. MD made aware that the patient was triaged as a code sepsis. EKG given to MD immediately.

## 2014-04-06 NOTE — ED Notes (Signed)
Admitting Physician at the bedside.  

## 2014-04-06 NOTE — H&P (Signed)
[facility] History and Physical  Brandon Sharp XBM:[id number] DOB: 1946-02-08    PCP:   [name], MD   Chief Complaint:  Weakness and confusion.  HPI: Brandon Sharp is an 68 y.o. 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  Wife stated that he has had low grade fever since a month ago, Tx with Z pak, then IV van/Zosyn in the hospital.  He has little coughs, no HA or diarrhea, no distant travel and no ill contact.  His most complaint was that of generalized fatigue and lost of appetite.  Evalaution in the ER showed no leukocytosis, but he had fever of 101. And CXR showed LLL persistent infiltrate.  BC was obtained and he was given again Van/Zosyn, and hospitalist was asked to admit him for HCAP, again. His Cr was 3.9 and his K was 3.5.  Rewiew of Systems:  Constitutional:  No significant weight loss or weight gain Eyes: Negative for eye pain, redness and discharge, diplopia, visual changes, or flashes of light. ENMT: Negative for ear pain, hoarseness, nasal congestion, sinus pressure and sore throat. No headaches; tinnitus, drooling, or problem swallowing. Cardiovascular: Negative for chest pain, palpitations, diaphoresis, dyspnea and peripheral edema. ; No orthopnea, PND Respiratory: Negative for cough, hemoptysis, wheezing and stridor. No pleuritic chestpain. Gastrointestinal: Negative for nausea, vomiting, diarrhea, constipation, abdominal pain, melena, blood in stool, hematemesis, jaundice and rectal bleeding.    Genitourinary: Negative for frequency, dysuria, incontinence,flank pain and  hematuria; Musculoskeletal: Negative for back pain and neck pain. Negative for swelling and trauma.;  Skin: . Negative for pruritus, rash, abrasions, bruising and skin lesion.; ulcerations Neuro: Negative for headache, lightheadedness and neck stiffness. Negative for weakness, altered level of consciousness , altered mental status, extremity weakness, burning feet, involuntary movement, seizure and syncope.  Psych: negative for anxiety, depression, insomnia, tearfulness, panic attacks, hallucinations, paranoia, suicidal or homicidal ideation    Past Medical History  Diagnosis Date  . Diabetes mellitus   . Hypertension   . Myocardial infarction 1985; 1990  . Angina   . Coronary artery disease   . Dysrhythmia   . DVT (deep venous thrombosis) ~ 12/2010    LLE  . Anemia   . Blood transfusion 06/1999    post heart transplant  . Peripheral vascular disease   . CHF (congestive heart failure) 2001    beffore transplant  . Renal failure     Hemodialysis MWF last 2 years, sse dr [name] nephrology, goes to [facility]    Past Surgical History  Procedure Laterality Date  . Nephrectomy transplanted organ  2008  . Av fistula repair      rt. a=fore arm fistula  . Av fistula placement  ~ 01/2010    "this was my 2nd fistula placement"  . Heart transplant  06/1999  . Coronary angioplasty with stent placement  1985; 1990  . Colonoscopy  03/17/2012    Procedure: COLONOSCOPY;  Surgeon: Lear Ng, MD;  Location: [facility];  Service: Endoscopy;  Laterality: N/A;  . Coronary artery bypass graft  1990    CABG X3  . Colonoscopy with propofol  N/A 12/14/2013    Procedure: COLONOSCOPY WITH PROPOFOL;  Surgeon: Lear Ng, MD;  Location: [facility];  Service: Endoscopy;  Laterality: N/A;    Medications:  HOME MEDS: Prior to Admission medications   Medication Sig Start Date End Date Taking? Authorizing Provider  aspirin EC 81 MG tablet Take 81 mg by mouth  daily.     Yes Historical Provider, MD  atorvastatin (LIPITOR) 40 MG tablet Take 40 mg by mouth daily.     Yes Historical Provider, MD  azaTHIOprine (IMURAN) 50 MG tablet Take 75 mg by mouth every morning.    Yes Historical Provider, MD  b complex-vitamin c-folic acid (NEPHRO-VITE) 0.8 MG TABS tablet Take 1 tablet by mouth at bedtime.   Yes Historical Provider, MD  calcium acetate (PHOSLO) 667 MG capsule Take 667 mg by mouth 3 (three) times daily with meals.  11/01/12  Yes Historical Provider, MD  carvedilol (COREG) 25 MG tablet Take 25 mg by mouth at bedtime.   Yes Historical Provider, MD  carvedilol (COREG) 25 MG tablet Take 50 mg by mouth 2 (two) times daily with a meal.   Yes Historical Provider, MD  colesevelam (WELCHOL) 625 MG tablet Take 3,750 mg by mouth daily. Takes 6 tabs daily   Yes Historical Provider, MD  famotidine (PEPCID AC) 10 MG chewable tablet Chew 10 mg by mouth daily as needed for heartburn.   Yes Historical Provider, MD  HUMALOG KWIKPEN 100 UNIT/ML SOPN Inject 6-10 Units into the skin 2 (two) times daily. Per sliding scale 11/17/12  Yes Historical Provider, MD  insulin glargine (LANTUS) 100 UNIT/ML injection Inject 20 Units into the skin every morning.   Yes Historical Provider, MD  isosorbide dinitrate (ISORDIL) 40 MG tablet Take 40 mg by mouth 3 (three) times daily.     Yes Historical Provider, MD  predniSONE (DELTASONE) 10 MG tablet Take 10 mg by mouth daily.     Yes Historical Provider, MD  sirolimus (RAPAMUNE) 2 MG tablet Take 2 mg by mouth daily.   Yes Historical Provider, MD  tacrolimus (PROGRAF) 0.5 MG capsule Take 0.5 mg by mouth 2 (two) times daily. Take with 1 mg to equal a total dose of 1.5 mg twice daily   Yes Historical Provider, MD  tacrolimus (PROGRAF) 1 MG capsule Take 1 mg by mouth 2 (two) times daily. Patient to take with 0.5 mg to equal a total dose of 1.5 mg twice daily   Yes Historical Provider, MD  cefTAZidime 2 g in dextrose 5 % 50 mL Inject 2 g into the  vein every Monday, Wednesday, and Friday with hemodialysis. X 1 week 04/01/14   Ripudeep Krystal Eaton, MD  Vancomycin (VANCOCIN) 750 MG/150ML SOLN Inject 150 mLs (750 mg total) into the vein every Monday, Wednesday, and Friday with hemodialysis. X 1 week 03/31/14   Ripudeep Krystal Eaton, MD     Allergies:  Allergies  Allergen Reactions  . Lisinopril Swelling    Lips and tongue swell  . Norvasc [Amlodipine Besylate] Rash    Flushing  . Penicillins Rash    Social History:   reports that he quit smoking about 25 years ago. His smoking use included Cigarettes. He has a 20 pack-year smoking history. He has never used smokeless tobacco. He reports that he drinks alcohol. He reports that he does not use illicit drugs.  Family History: Family History  Problem Relation Age of Onset  . Heart disease Mother   . Hyperlipidemia Mother   . Hypertension  Mother   . Hyperlipidemia Father   . Hypertension Father   . Diabetes Brother   . Heart disease Brother     Heart Disease before age 83  . Hyperlipidemia Brother   . Hypertension Brother      Physical Exam: Filed Vitals:   04/06/14 2224 04/06/14 2230 04/06/14 2300 04/06/14 2315  BP:  115/54 104/48 96/44  Pulse:  85 87   Temp: 99.2 F (37.3 C)     TempSrc: Oral     Resp:  18 20 20   Height:      SpO2:   98%    Blood pressure 96/44, pulse 87, temperature 99.2 F (37.3 C), temperature source Oral, resp. rate 20, height 6' (1.829 m), SpO2 98 %.  GEN:  Pleasant  patient lying in the stretcher in no acute distress; cooperative with exam. PSYCH:  alert and oriented x4; does not appear anxious or depressed; affect is appropriate. HEENT: Mucous membranes pink and anicteric; PERRLA; EOM intact; no cervical lymphadenopathy nor thyromegaly or carotid bruit; no JVD; There were no stridor. Neck is very supple. Breasts:: Not examined CHEST WALL: No tenderness CHEST: Normal respiration, clear to auscultation bilaterally.  HEART: Regular rate and rhythm.  There  are no murmur, rub, or gallops.   BACK: No kyphosis or scoliosis; no CVA tenderness ABDOMEN: soft and non-tender; no masses, no organomegaly, normal abdominal bowel sounds; no pannus; no intertriginous candida. There is no rebound and no distention. Rectal Exam: Not done EXTREMITIES: No bone or joint deformity; age-appropriate arthropathy of the hands and knees; no edema; no ulcerations.  There is no calf tenderness. Fistula with thrill on right upper arm. Genitalia: not examined PULSES: 2+ and symmetric SKIN: Normal hydration no rash or ulceration CNS: Cranial nerves 2-12 grossly intact no focal lateralizing neurologic deficit.  Speech is fluent; uvula elevated with phonation, facial symmetry and tongue midline. DTR are normal bilaterally, cerebella exam is intact, barbinski is negative and strengths are equaled bilaterally.  No sensory loss.   Labs on Admission:  Basic Metabolic Panel:  Recent Labs Lab 03/31/14 0500 04/06/14 1900  NA 140 139  K 4.2 3.5*  CL 97 90*  CO2 25 28  GLUCOSE 77 75  BUN 22 16  CREATININE 5.23* 3.90*  CALCIUM 9.3 9.3   Liver Function Tests:  Recent Labs Lab 04/06/14 1855  AST 47*  ALT 17  ALKPHOS 62  BILITOT 0.3  PROT 7.1  ALBUMIN 2.6*   CBC:  Recent Labs Lab 03/31/14 0500 04/06/14 1900  WBC 5.5 5.2  HGB 9.7* 10.3*  HCT 30.0* 31.4*  MCV 97.4 97.5  PLT 101* 125*   Cardiac Enzymes:  Recent Labs Lab 04/06/14 1855  TROPONINI <0.30    CBG:  Recent Labs Lab 03/31/14 0835 03/31/14 0904 03/31/14 1137 04/06/14 1849  GLUCAP 63* 90 233* 69*     Radiological Exams on Admission: Dg Chest 2 View  04/06/2014   CLINICAL DATA:  Fever for 1 week.  EXAM: CHEST  2 VIEW  COMPARISON:  03/27/2014  FINDINGS: Dense airspace disease at the left base. In the setting of fever this is most consistent with pneumonia. No effusion or cavitation. There may also be mild airspace opacity at the peripheral right base. There is cardiomegaly which is  stable from previous. Negative aortic and hilar contours. Status post central venous stenting.  IMPRESSION: Left lower lobe pneumonia.   Electronically Signed   By: Jorje Guild M.D.   On: 04/06/2014 20:14   Ct  Head Wo Contrast  04/06/2014   CLINICAL DATA:  Headache beginning today.  Fever.  EXAM: CT HEAD WITHOUT CONTRAST  TECHNIQUE: Contiguous axial images were obtained from the base of the skull through the vertex without intravenous contrast.  COMPARISON:  Head CT scan 07/19/2010.  FINDINGS: There is some cortical atrophy which is unchanged in appearance. No evidence of acute intracranial abnormality including infarct, hemorrhage, mass lesion, mass effect, midline shift or abnormal extra-axial fluid collection is identified. There is no hydrocephalus or pneumocephalus. The calvarium is intact. There is near complete opacification of left maxillary sinus. Ethmoid air cell disease is also seen on the left. Carotid atherosclerosis is noted.  IMPRESSION: No acute intracranial abnormality.  Near-complete opacification of the left maxillary sinus and mild left ethmoid air cell disease.   Electronically Signed   By: Inge Rise M.D.   On: 04/06/2014 21:07    Assessment/Plan Present on Admission:  . Fever, unknown origin . DM type 2 causing complication . Dyslipidemia . CKD (chronic kidney disease), stage V . Fever of undetermined origin  PLAN:  Will admit this patient for persistent fever of unclear source.  I don't think HCAP is the sole culprit as he had an infiltrate on his LLL area, and the Xray may be chronic as well (dated back since 3/15).  Given that he had "solid organ transplant", I would also be concerned about atypical infections, like CMV, EBV, nocardia, fungal infection etc... For now, will obtain [facility], and continue Van/Cefepime.  Will check CMV PCR and HIV.  Please consult infectious disease tomorrow for further recommendation.  I will continue his suppressive meds, including  steroids, and will give stress doses with IV solucortef x 3.   He will need HD on Friday, so please consult nephrologist as well.  Thank you.  Other plans as per orders.  Code Status: FULL Haskel Khan, MD. [facility] Pager [phone] 7pm to 7am.  04/06/2014, 11:39 PM

## 2014-04-06 NOTE — ED Notes (Signed)
MD at the bedside placing EJ. RNs attempted IV x4. MD successful.

## 2014-04-06 NOTE — ED Notes (Signed)
Dr Colin Rhein given a copy of lactic acid results 3.34

## 2014-04-06 NOTE — ED Notes (Signed)
Per EMS, Patient came from home with feverish and sick feeling for about a week. Patient has been taking Tylenol to control the fever. Patient reports starting of abdominal pain during transport. EMS noted diminished right sided lung sounds. Patient is a dialysis patient and did not complete treatment today due to fever. Patient alert and oriented x4. Vitals per EMS: 128/66, 109 HR, 18 RR, 73 CBG. 4/10 Abdominal Pain.

## 2014-04-06 NOTE — ED Notes (Signed)
Dr Colin Rhein given result of troponin .[place]

## 2014-04-06 NOTE — ED Provider Notes (Signed)
CSN: [id number]     Arrival date & time 04/06/14  1842 History   First MD Initiated Contact with Patient 04/06/14 1843     Chief Complaint  Patient presents with  . Fever  . Abdominal Pain     (Consider location/radiation/quality/duration/timing/severity/associated sxs/prior Treatment) Patient is a 68 y.o. male presenting with altered mental status.  Altered Mental Status Presenting symptoms: confusion   Severity:  Severe Most recent episode:  Today Episode history:  Continuous Duration:  1 day Timing:  Constant Progression:  Unchanged Chronicity:  New Context: not alcohol use   Context comment:  ESRD, recent DC for PNA Associated symptoms: no abdominal pain, no fever, no headaches, no light-headedness, no nausea, no rash and no vomiting     Past Medical History  Diagnosis Date  . Diabetes mellitus   . Hypertension   . Myocardial infarction 1985; 1990  . Angina   . Coronary artery disease   . Dysrhythmia   . DVT (deep venous thrombosis) ~ 12/2010    LLE  . Anemia   . Blood transfusion 06/1999    post heart transplant  . Peripheral vascular disease   . CHF (congestive heart failure) 2001    beffore transplant  . Renal failure     Hemodialysis MWF last 2 years, sse dr [name] nephrology, goes to [facility]  . Renal insufficiency    Past Surgical History  Procedure Laterality Date  . Nephrectomy transplanted organ  2008  . Av fistula repair      rt. a=fore arm fistula  . Av fistula placement  ~ 01/2010    "this was my 2nd fistula placement"  . Heart transplant  06/1999  . Coronary angioplasty with stent placement  1985; 1990  . Colonoscopy  03/17/2012    Procedure: COLONOSCOPY;  Surgeon: Lear Ng, MD;  Location: [facility];  Service: Endoscopy;  Laterality: N/A;  . Coronary artery bypass graft  1990    CABG X3  . Colonoscopy with propofol N/A 12/14/2013    Procedure: COLONOSCOPY WITH PROPOFOL;  Surgeon: Lear Ng, MD;   Location: [facility];  Service: Endoscopy;  Laterality: N/A;   Family History  Problem Relation Age of Onset  . Heart disease Mother   . Hyperlipidemia Mother   . Hypertension Mother   . Hyperlipidemia Father   . Hypertension Father   . Diabetes Brother   . Heart disease Brother     Heart Disease before age 48  . Hyperlipidemia Brother   . Hypertension Brother    History  Substance Use Topics  . Smoking status: Former Smoker -- 1.00 packs/day for 20 years    Types: Cigarettes    Quit date: 05/27/1988  . Smokeless tobacco: Never Used  . Alcohol Use: Yes     Comment: "Holidays"    Review of Systems  Constitutional: Negative for fever.  Gastrointestinal: Negative for nausea, vomiting and abdominal pain.  Skin: Negative for rash.  Neurological: Negative for light-headedness and headaches.  Psychiatric/Behavioral: Positive for confusion.  All other systems reviewed and are negative.     Allergies  Lisinopril; Norvasc; and Penicillins  Home Medications   Prior to Admission medications   Medication Sig Start Date End Date Taking? Authorizing Provider  aspirin EC 81 MG tablet Take 81 mg by mouth daily.     Yes Historical Provider, MD  atorvastatin (LIPITOR) 40 MG tablet Take 40 mg by mouth daily.     Yes Historical Provider, MD  azaTHIOprine (  IMURAN) 50 MG tablet Take 75 mg by mouth every morning.    Yes Historical Provider, MD  b complex-vitamin c-folic acid (NEPHRO-VITE) 0.8 MG TABS tablet Take 1 tablet by mouth at bedtime.   Yes Historical Provider, MD  calcium acetate (PHOSLO) 667 MG capsule Take 667 mg by mouth 3 (three) times daily with meals.  11/01/12  Yes Historical Provider, MD  carvedilol (COREG) 25 MG tablet Take 25 mg by mouth at bedtime.   Yes Historical Provider, MD  colesevelam (WELCHOL) 625 MG tablet Take 3,750 mg by mouth daily. Takes 6 tabs daily   Yes Historical Provider, MD  famotidine (PEPCID AC) 10 MG chewable tablet Chew 10 mg by mouth at bedtime.     Yes Historical Provider, MD  HUMALOG KWIKPEN 100 UNIT/ML SOPN Inject 6-10 Units into the skin 2 (two) times daily. Per sliding scale 11/17/12  Yes Historical Provider, MD  insulin glargine (LANTUS) 100 UNIT/ML injection Inject 20 Units into the skin every morning.   Yes Historical Provider, MD  isosorbide dinitrate (ISORDIL) 40 MG tablet Take 40 mg by mouth 3 (three) times daily.     Yes Historical Provider, MD  predniSONE (DELTASONE) 1 MG tablet Take 2 mg by mouth daily with breakfast.   Yes Historical Provider, MD  sirolimus (RAPAMUNE) 2 MG tablet Take 2 mg by mouth daily.   Yes Historical Provider, MD  cefTAZidime 2 g in dextrose 5 % 50 mL Inject 2 g into the vein every Monday, Wednesday, and Friday with hemodialysis. X 1 week 04/01/14   Ripudeep Krystal Eaton, MD  Vancomycin (VANCOCIN) 750 MG/150ML SOLN Inject 150 mLs (750 mg total) into the vein every Monday, Wednesday, and Friday with hemodialysis. X 1 week 03/31/14   Ripudeep K Rai, MD   BP 116/54 mmHg  Pulse 77  Temp(Src) 97.9 F (36.6 C) (Oral)  Resp 12  Ht 6' (1.829 m)  Wt 152 lb 5.4 oz (69.1 kg)  BMI 20.66 kg/m2  SpO2 96% Physical Exam  Constitutional: He is oriented to person, place, and time. He appears well-developed and well-nourished.  HENT:  Head: Normocephalic and atraumatic.  Eyes: Conjunctivae and EOM are normal.  Neck: Normal range of motion. Neck supple.  Cardiovascular: Normal rate, regular rhythm and normal heart sounds.   Pulmonary/Chest: Effort normal and breath sounds normal. No respiratory distress.  Abdominal: He exhibits no distension. There is no tenderness. There is no rebound and no guarding.  Musculoskeletal: Normal range of motion.  Neurological: He is alert and oriented to person, place, and time. GCS eye subscore is 4. GCS verbal subscore is 4. GCS motor subscore is 6.  No focal neuro deficits  Skin: Skin is warm and dry.  Vitals reviewed.   ED Course  Procedures (including critical care time) Labs  Review Labs Reviewed  CBC - Abnormal; Notable for the following:    RBC 3.22 (*)    Hemoglobin 10.3 (*)    HCT 31.4 (*)    Platelets 125 (*)    All other components within normal limits  BASIC METABOLIC PANEL - Abnormal; Notable for the following:    Potassium 3.5 (*)    Chloride 90 (*)    Creatinine, Ser 3.90 (*)    GFR calc non Af Amer 14 (*)    GFR calc Af Amer 17 (*)    Anion gap 21 (*)    All other components within normal limits  HEPATIC FUNCTION PANEL - Abnormal; Notable for the following:  Albumin 2.6 (*)    AST 47 (*)    All other components within normal limits  GLUCOSE, CAPILLARY - Abnormal; Notable for the following:    Glucose-Capillary 63 (*)    All other components within normal limits  CBC - Abnormal; Notable for the following:    RBC 2.83 (*)    Hemoglobin 8.8 (*)    HCT 28.1 (*)    Platelets 109 (*)    All other components within normal limits  CREATININE, SERUM - Abnormal; Notable for the following:    Creatinine, Ser 5.03 (*)    GFR calc non Af Amer 11 (*)    GFR calc Af Amer 12 (*)    All other components within normal limits  EBV AB TO VIRAL CAPSID AG PNL, IGG+IGM - Abnormal; Notable for the following:    EBV VCA IgG 191.0 (*)    All other components within normal limits  GLUCOSE, CAPILLARY - Abnormal; Notable for the following:    Glucose-Capillary 132 (*)    All other components within normal limits  GLUCOSE, CAPILLARY - Abnormal; Notable for the following:    Glucose-Capillary 351 (*)    All other components within normal limits  GLUCOSE, CAPILLARY - Abnormal; Notable for the following:    Glucose-Capillary 322 (*)    All other components within normal limits  GLUCOSE, CAPILLARY - Abnormal; Notable for the following:    Glucose-Capillary 199 (*)    All other components within normal limits  CBG MONITORING, ED - Abnormal; Notable for the following:    Glucose-Capillary 69 (*)    All other components within normal limits  I-STAT CG4 LACTIC  ACID, ED - Abnormal; Notable for the following:    Lactic Acid, Venous 3.34 (*)    All other components within normal limits  I-STAT TROPOININ, ED - Abnormal; Notable for the following:    Troponin i, poc 0.14 (*)    All other components within normal limits  I-STAT TROPOININ, ED - Abnormal; Notable for the following:    Troponin i, poc 0.32 (*)    All other components within normal limits  MRSA PCR SCREENING  CULTURE, BLOOD (ROUTINE X 2)  CULTURE, BLOOD (ROUTINE X 2)  TROPONIN I  TSH  HIV ANTIBODY (ROUTINE TESTING)  CMV (CYTOMEGALOVIRUS) DNA ULTRAQUANT, PCR  CBC  BASIC METABOLIC PANEL  LEGIONELLA ANTIGEN, URINE  HISTOPLASMA ANTIGEN, URINE  LACTIC ACID, PLASMA  CRYPTOCOCCAL ANTIGEN  RPR  HEPATITIS PANEL, ACUTE  SEDIMENTATION RATE  C-REACTIVE PROTEIN    Imaging Review Dg Chest 2 View  04/06/2014   CLINICAL DATA:  Fever for 1 week.  EXAM: CHEST  2 VIEW  COMPARISON:  03/27/2014  FINDINGS: Dense airspace disease at the left base. In the setting of fever this is most consistent with pneumonia. No effusion or cavitation. There may also be mild airspace opacity at the peripheral right base. There is cardiomegaly which is stable from previous. Negative aortic and hilar contours. Status post central venous stenting.  IMPRESSION: Left lower lobe pneumonia.   Electronically Signed   By: Jorje Guild M.D.   On: 04/06/2014 20:14   Ct Head Wo Contrast  04/06/2014   CLINICAL DATA:  Headache beginning today.  Fever.  EXAM: CT HEAD WITHOUT CONTRAST  TECHNIQUE: Contiguous axial images were obtained from the base of the skull through the vertex without intravenous contrast.  COMPARISON:  Head CT scan 07/19/2010.  FINDINGS: There is some cortical atrophy which is unchanged in appearance. No evidence of  acute intracranial abnormality including infarct, hemorrhage, mass lesion, mass effect, midline shift or abnormal extra-axial fluid collection is identified. There is no hydrocephalus or  pneumocephalus. The calvarium is intact. There is near complete opacification of left maxillary sinus. Ethmoid air cell disease is also seen on the left. Carotid atherosclerosis is noted.  IMPRESSION: No acute intracranial abnormality.  Near-complete opacification of the left maxillary sinus and mild left ethmoid air cell disease.   Electronically Signed   By: Inge Rise M.D.   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  EXAM: CT CHEST, ABDOMEN, AND PELVIS WITH CONTRAST  TECHNIQUE: Multidetector CT imaging of the chest, abdomen and pelvis was performed following the standard protocol during bolus administration of intravenous contrast. Sagittal and coronal MPR images reconstructed from axial data set.  CONTRAST:  26mL OMNIPAQUE IOHEXOL 300 MG/ML SOLN IV. Dilute oral contrast.  COMPARISON:  PET-CT 09/27/2010, CT abdomen and pelvis 06/01/2008, CT chest 03/24/2008  FINDINGS: CT CHEST FINDINGS  Extensive atherosclerotic calcification of the aorta and coronary arteries with evidence of prior CABG and coronary stenting.  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  Enlargement of cardiac chambers.  No thoracic adenopathy.  Pulmonary infiltrates are identified within the lingula and BILATERAL lower lobes question pneumonia.  Remaining lungs clear.  No definite pleural effusion or pneumothorax.  Diffuse osseous demineralization.  CT ABDOMEN AND PELVIS FINDINGS  Atrophic polycystic appearing kidneys.  Intermediate attenuation cyst at inferior pole LEFT kidney image 83, 14 x 12 mm.  Several hepatic cysts up to 2.2 x 2.1 cm.  Low-attenuation lesion posterior lateral upper to mid spleen, 2.2 x 1.6 cm image 65, new.   Remainder of liver, spleen, pancreas, and adrenal glands normal.  Transplant kidney RIGHT iliac fossa without gross mass or hydronephrosis.  Scattered colonic diverticulosis without evidence of diverticulitis.  Stomach and bowel loops otherwise normal appearance.  No mass, adenopathy, free fluid or inflammatory process.  Scattered atherosclerotic calcifications.  No acute osseous findings.  IMPRESSION: Extensive atherosclerotic disease with aneurysmal dilatation of the ascending thoracic aorta 4.2 x 4.3 cm.  Post CABG and coronary artery stenting.  BILATERAL lower lobe and lingular infiltrates question pneumonia.  Cystic disease of the liver and native kidneys with unremarkable transplant kidney at RIGHT iliac fossa.  Colonic diverticulosis.  Nonspecific 2.2 x 1.6 cm diameter low-attenuation lesion within the spleen; this has a peripheral base and fills in with contrast on incomplete delayed imaging.  Differential diagnosis would include perfusion artifact, hemangioma, and mass lesion.  Remainder of exam unremarkable without identification of an acute inflammatory process.   Electronically Signed   By: Lavonia Dana M.D.   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  EXAM: CT CHEST, ABDOMEN, AND PELVIS WITH CONTRAST  TECHNIQUE: Multidetector CT imaging of the chest, abdomen and pelvis was performed following the standard protocol during bolus administration of intravenous contrast. Sagittal and coronal MPR images reconstructed from axial data set.  CONTRAST:  22mL OMNIPAQUE IOHEXOL 300 MG/ML  SOLN IV. Dilute oral contrast.  COMPARISON:  PET-CT 09/27/2010, CT abdomen and pelvis 06/01/2008, CT chest 03/24/2008  FINDINGS: CT CHEST FINDINGS   Extensive atherosclerotic calcification of the aorta and coronary arteries with evidence of prior CABG and coronary stenting.  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  Enlargement of cardiac chambers.  No thoracic adenopathy.  Pulmonary infiltrates are identified within the lingula and BILATERAL lower lobes question pneumonia.  Remaining lungs clear.  No definite pleural effusion or pneumothorax.  Diffuse osseous demineralization.  CT ABDOMEN AND PELVIS FINDINGS  Atrophic polycystic appearing kidneys.  Intermediate attenuation cyst at inferior pole LEFT kidney image 83, 14 x 12 mm.  Several hepatic cysts up to 2.2 x 2.1 cm.  Low-attenuation lesion posterior lateral upper to mid spleen, 2.2 x 1.6 cm image 65, new.  Remainder of liver, spleen, pancreas, and adrenal glands normal.  Transplant kidney RIGHT iliac fossa without gross mass or hydronephrosis.  Scattered colonic diverticulosis without evidence of diverticulitis.  Stomach and bowel loops otherwise normal appearance.  No mass, adenopathy, free fluid or inflammatory process.  Scattered atherosclerotic calcifications.  No acute osseous findings.  IMPRESSION: Extensive atherosclerotic disease with aneurysmal dilatation of the ascending thoracic aorta 4.2 x 4.3 cm.  Post CABG and coronary artery stenting.  BILATERAL lower lobe and lingular infiltrates question pneumonia.  Cystic disease of the liver and native kidneys with unremarkable transplant kidney at RIGHT iliac fossa.  Colonic diverticulosis.  Nonspecific 2.2 x 1.6 cm diameter low-attenuation lesion within the spleen; this has a peripheral base and fills in with contrast on incomplete delayed imaging.  Differential diagnosis would include perfusion artifact, hemangioma, and mass lesion.  Remainder of exam unremarkable without identification of an acute inflammatory process.   Electronically Signed   By: Lavonia Dana M.D.   On: 04/07/2014 17:32     EKG  Interpretation   Date/Time:  Wednesday April 06 2014 18:47:04 EST Ventricular Rate:  111 PR Interval:  120 QRS Duration: 158 QT Interval:  415 QTC Calculation: 564 R Axis:   -67 Text Interpretation:  Sinus or ectopic atrial tachycardia Right bundle  branch block LVH with IVCD and secondary repol abnrm Probable inferior  infarct, acute No significant change since last tracing Confirmed by  Debby Freiberg [id number]) on 04/06/2014 6:52:59 PM      MDM   Final diagnoses:  Fever    68 y.o. male with pertinent PMH of ESRD on dialysis MWF, recent PNA, prior heart and lung transplant presents with altered mental status as described above.  Pt had partial dialysis today, stopped for malaise.  On arrival vitals and physical exam as above.  Pt febrile.  No focal neuro deficits, oriented to name alone.  No abd tenderness.  Labs and imaging as above obtained.  Continued LLL opacity.  Vanc/zosyn given.   Initially withheld fluids secondary to normal pressure and ESRD status.  Pt then became hypotensive.  He responded to 500cc.  Initial trop positive, but identical to prior.  Likely stress given fever and overall status without chest pain, so no heparin started.  Consulted hospitalist for stepdown admission.   1. Fever   2. Immunosuppressed status         Debby Freiberg, MD 04/07/14 [phone]

## 2014-04-07 ENCOUNTER — Encounter (HOSPITAL_COMMUNITY): Payer: Self-pay | Admitting: *Deleted

## 2014-04-07 ENCOUNTER — Inpatient Hospital Stay (HOSPITAL_COMMUNITY): Payer: Medicare Other

## 2014-04-07 DIAGNOSIS — E118 Type 2 diabetes mellitus with unspecified complications: Secondary | ICD-10-CM

## 2014-04-07 DIAGNOSIS — N185 Chronic kidney disease, stage 5: Secondary | ICD-10-CM

## 2014-04-07 DIAGNOSIS — Z992 Dependence on renal dialysis: Secondary | ICD-10-CM

## 2014-04-07 DIAGNOSIS — T8612 Kidney transplant failure: Secondary | ICD-10-CM | POA: Insufficient documentation

## 2014-04-07 DIAGNOSIS — D849 Immunodeficiency, unspecified: Secondary | ICD-10-CM | POA: Insufficient documentation

## 2014-04-07 DIAGNOSIS — E785 Hyperlipidemia, unspecified: Secondary | ICD-10-CM

## 2014-04-07 DIAGNOSIS — H43399 Other vitreous opacities, unspecified eye: Secondary | ICD-10-CM | POA: Insufficient documentation

## 2014-04-07 DIAGNOSIS — N186 End stage renal disease: Secondary | ICD-10-CM

## 2014-04-07 DIAGNOSIS — Z941 Heart transplant status: Secondary | ICD-10-CM

## 2014-04-07 DIAGNOSIS — D899 Disorder involving the immune mechanism, unspecified: Secondary | ICD-10-CM

## 2014-04-07 LAB — GLUCOSE, CAPILLARY
GLUCOSE-CAPILLARY: 132 mg/dL — AB (ref 70–99)
GLUCOSE-CAPILLARY: 182 mg/dL — AB (ref 70–99)
Glucose-Capillary: 63 mg/dL — ABNORMAL LOW (ref 70–99)
Glucose-Capillary: 351 mg/dL — ABNORMAL HIGH (ref 70–99)
Glucose-Capillary: 322 mg/dL — ABNORMAL HIGH (ref 70–99)
Glucose-Capillary: 199 mg/dL — ABNORMAL HIGH (ref 70–99)

## 2014-04-07 LAB — TSH: TSH: 1.96 u[IU]/mL (ref 0.350–4.500)

## 2014-04-07 LAB — MRSA PCR SCREENING: MRSA by PCR: NEGATIVE

## 2014-04-07 LAB — CBC
HCT: 28.1 % — ABNORMAL LOW (ref 39.0–52.0)
Hemoglobin: 8.8 g/dL — ABNORMAL LOW (ref 13.0–17.0)
MCH: 31.1 pg (ref 26.0–34.0)
MCHC: 31.3 g/dL (ref 30.0–36.0)
MCV: 99.3 fL (ref 78.0–100.0)
Platelets: 109 10*3/uL — ABNORMAL LOW (ref 150–400)
RBC: 2.83 MIL/uL — ABNORMAL LOW (ref 4.22–5.81)
RDW: 14.9 % (ref 11.5–15.5)
WBC: 4.9 10*3/uL (ref 4.0–10.5)

## 2014-04-07 LAB — I-STAT TROPONIN, ED: Troponin i, poc: 0.32 ng/mL (ref 0.00–0.08)

## 2014-04-07 LAB — CREATININE, SERUM
Creatinine, Ser: 5.03 mg/dL — ABNORMAL HIGH (ref 0.50–1.35)
GFR calc Af Amer: 12 mL/min — ABNORMAL LOW (ref >90)
GFR calc non Af Amer: 11 mL/min — ABNORMAL LOW (ref >90)

## 2014-04-07 LAB — EBV AB TO VIRAL CAPSID AG PNL, IGG+IGM: EBV VCA IgG: 191 U/mL — ABNORMAL HIGH (ref <18.0)

## 2014-04-07 LAB — HIV ANTIBODY (ROUTINE TESTING W REFLEX): HIV 1&2 Ab, 4th Generation: NONREACTIVE

## 2014-04-07 MED ORDER — AZATHIOPRINE 50 MG PO TABS
75.0000 mg | ORAL_TABLET | Freq: Every morning | ORAL | Status: DC
  Administered 2014-04-07 – 2014-04-10 (×4): 75 mg via ORAL
  Filled 2014-04-07 (×4): qty 2

## 2014-04-07 MED ORDER — PREDNISONE 10 MG PO TABS
10.0000 mg | ORAL_TABLET | Freq: Every day | ORAL | Status: DC
  Administered 2014-04-08 – 2014-04-10 (×3): 10 mg via ORAL
  Filled 2014-04-07 (×3): qty 1

## 2014-04-07 MED ORDER — DARBEPOETIN ALFA 100 MCG/0.5ML IJ SOSY: 100.0000 ug | PREFILLED_SYRINGE | INTRAMUSCULAR | Status: DC

## 2014-04-07 MED ORDER — FAMOTIDINE 10 MG PO TABS
10.0000 mg | ORAL_TABLET | Freq: Every day | ORAL | Status: DC | PRN
  Filled 2014-04-07: qty 1

## 2014-04-07 MED ORDER — IOHEXOL 300 MG/ML  SOLN
25.0000 mL | INTRAMUSCULAR | Status: AC
  Administered 2014-04-07 (×2): 25 mL via ORAL

## 2014-04-07 MED ORDER — SODIUM CHLORIDE 0.9 % IJ SOLN
3.0000 mL | Freq: Two times a day (BID) | INTRAMUSCULAR | Status: DC
  Administered 2014-04-07 – 2014-04-09 (×7): 3 mL via INTRAVENOUS

## 2014-04-07 MED ORDER — INSULIN ASPART 100 UNIT/ML ~~LOC~~ SOLN
0.0000 [IU] | Freq: Three times a day (TID) | SUBCUTANEOUS | Status: DC
  Administered 2014-04-07: 3 [IU] via SUBCUTANEOUS
  Administered 2014-04-07: 15 [IU] via SUBCUTANEOUS
  Administered 2014-04-08: 2 [IU] via SUBCUTANEOUS
  Administered 2014-04-08 – 2014-04-09 (×3): 3 [IU] via SUBCUTANEOUS

## 2014-04-07 MED ORDER — HEPARIN SODIUM (PORCINE) 5000 UNIT/ML IJ SOLN
5000.0000 [IU] | Freq: Three times a day (TID) | INTRAMUSCULAR | Status: DC
  Administered 2014-04-07 – 2014-04-10 (×10): 5000 [IU] via SUBCUTANEOUS
  Filled 2014-04-07 (×13): qty 1

## 2014-04-07 MED ORDER — TACROLIMUS 1 MG PO CAPS: 1.0000 mg | ORAL_CAPSULE | Freq: Two times a day (BID) | ORAL | Status: DC

## 2014-04-07 MED ORDER — INSULIN GLARGINE 100 UNIT/ML ~~LOC~~ SOLN
20.0000 [IU] | Freq: Every morning | SUBCUTANEOUS | Status: DC
  Administered 2014-04-07 – 2014-04-10 (×4): 20 [IU] via SUBCUTANEOUS
  Filled 2014-04-07 (×4): qty 0.2

## 2014-04-07 MED ORDER — VANCOMYCIN HCL IN DEXTROSE 750-5 MG/150ML-% IV SOLN
750.0000 mg | INTRAVENOUS | Status: AC
  Administered 2014-04-08: 750 mg via INTRAVENOUS
  Filled 2014-04-07 (×2): qty 150

## 2014-04-07 MED ORDER — DOXERCALCIFEROL 4 MCG/2ML IV SOLN
2.0000 ug | INTRAVENOUS | Status: DC
  Administered 2014-04-08: 2 ug via INTRAVENOUS

## 2014-04-07 MED ORDER — ASPIRIN EC 81 MG PO TBEC
81.0000 mg | DELAYED_RELEASE_TABLET | Freq: Every day | ORAL | Status: DC
  Administered 2014-04-07 – 2014-04-10 (×4): 81 mg via ORAL
  Filled 2014-04-07 (×4): qty 1

## 2014-04-07 MED ORDER — INSULIN ASPART 100 UNIT/ML ~~LOC~~ SOLN
0.0000 [IU] | Freq: Every day | SUBCUTANEOUS | Status: DC
  Administered 2014-04-09: 2 [IU] via SUBCUTANEOUS

## 2014-04-07 MED ORDER — HYDROCORTISONE NA SUCCINATE PF 100 MG IJ SOLR
50.0000 mg | Freq: Three times a day (TID) | INTRAMUSCULAR | Status: AC
  Administered 2014-04-07 (×3): 50 mg via INTRAVENOUS
  Filled 2014-04-07: qty 1
  Filled 2014-04-07: qty 2
  Filled 2014-04-07: qty 1

## 2014-04-07 MED ORDER — SIROLIMUS 1 MG PO TABS
2.0000 mg | ORAL_TABLET | Freq: Every day | ORAL | Status: DC
  Administered 2014-04-07 – 2014-04-10 (×4): 2 mg via ORAL
  Filled 2014-04-07 (×4): qty 2

## 2014-04-07 MED ORDER — RENA-VITE PO TABS
1.0000 | ORAL_TABLET | Freq: Every day | ORAL | Status: DC
  Administered 2014-04-07 – 2014-04-09 (×3): 1 via ORAL
  Filled 2014-04-07 (×4): qty 1

## 2014-04-07 MED ORDER — IOHEXOL 300 MG/ML  SOLN
80.0000 mL | Freq: Once | INTRAMUSCULAR | Status: AC | PRN
  Administered 2014-04-07: 80 mL via INTRAVENOUS

## 2014-04-07 MED ORDER — VANCOMYCIN HCL 500 MG IV SOLR
500.0000 mg | Freq: Once | INTRAVENOUS | Status: DC
  Filled 2014-04-07: qty 500

## 2014-04-07 MED ORDER — DEXTROSE 5 % IV SOLN
2.0000 g | INTRAVENOUS | Status: AC
  Administered 2014-04-08: 2 g via INTRAVENOUS
  Filled 2014-04-07 (×2): qty 2

## 2014-04-07 MED ORDER — ATORVASTATIN CALCIUM 40 MG PO TABS
40.0000 mg | ORAL_TABLET | Freq: Every day | ORAL | Status: DC
  Administered 2014-04-07 – 2014-04-10 (×4): 40 mg via ORAL
  Filled 2014-04-07 (×4): qty 1

## 2014-04-07 MED ORDER — DEXTROSE 5 % IV SOLN: 1.0000 g | Freq: Two times a day (BID) | INTRAVENOUS | Status: DC

## 2014-04-07 MED ORDER — CALCIUM ACETATE 667 MG PO CAPS
667.0000 mg | ORAL_CAPSULE | Freq: Three times a day (TID) | ORAL | Status: DC
  Administered 2014-04-07 – 2014-04-10 (×10): 667 mg via ORAL
  Filled 2014-04-07 (×13): qty 1

## 2014-04-07 MED ORDER — COLESEVELAM HCL 625 MG PO TABS
3750.0000 mg | ORAL_TABLET | Freq: Every day | ORAL | Status: DC
  Administered 2014-04-07 – 2014-04-10 (×4): 3750 mg via ORAL
  Filled 2014-04-07 (×4): qty 6

## 2014-04-07 NOTE — Progress Notes (Addendum)
PROGRESS NOTE  Brandon Sharp GGY:[id number] DOB: 02-Dec-1945 DOA: 04/06/2014 PCP: [name], MD  Brandon Sharp is an 68 y.o. 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 Wife stated that he has had low grade fever since a month ago, Tx with Z pak, then IV van/Zosyn in the hospital. He has little coughs, no HA or diarrhea, no distant travel and no ill contact. His most complaint was that of generalized fatigue and lost of appetite. Evalaution in the ER showed no leukocytosis, but he had fever of 101. And CXR showed LLL persistent infiltrate. BC was obtained and he was given again Van/Zosyn, and hospitalist was asked to admit him for HCAP, again. His Cr was 3.9 and his K was 3.5   Assessment/Plan: Fever- ? Source- recent PNA and given abx at d/c 1 week ago: ID consult, blood cultures pending: concerned about atypical infections, like CMV, EBV, nocardia, fungal infection   ESRD: nephrology consult M/W/F   Sepsis- monitor cultures, ID consult, repeat lactic acid -central line placed in ER  s/p heart transplant- will ask pharmacy to verify immunosupressants  DM- SSI  -spoke with Dr. Tommy Medal who will see that patient- would like CT scan chest/abd/pelvis    Code Status: full Family Communication: patient Disposition Plan: admit   Consultants:  Renal  ID  Procedures:     HPI/Subjective: Feeling better since being admitted No overnight events  Objective: Filed Vitals:   04/07/14 0700  BP:   Pulse:   Temp: 98.6 F (37 C)  Resp:    No intake or output data in the 24 hours ending  04/07/14 0806 Filed Weights   04/07/14 0101  Weight: 69.1 kg (152 lb 5.4 oz)    Exam:   General:  A+Ox3, NAD  Cardiovascular: rrr  Respiratory: clear  Abdomen: +BS, soft  Musculoskeletal: no edema  Data Reviewed: Basic Metabolic Panel:  Recent Labs Lab 04/06/14 1900 04/07/14 0300  NA 139  --   K 3.5*  --   CL 90*  --   CO2 28  --   GLUCOSE 75  --   BUN 16  --   CREATININE 3.90* 5.03*  CALCIUM 9.3  --    Liver Function Tests:  Recent Labs Lab 04/06/14 1855  AST 47*  ALT 17  ALKPHOS 62  BILITOT 0.3  PROT 7.1  ALBUMIN 2.6*   No results for input(s): LIPASE, AMYLASE in the last 168 hours. No results for input(s): AMMONIA in the last 168 hours. CBC:  Recent Labs Lab 04/06/14 1900 04/07/14 0300  WBC 5.2 4.9  HGB 10.3* 8.8*  HCT 31.4* 28.1*  MCV 97.5 99.3  PLT 125* 109*   Cardiac Enzymes:  Recent Labs Lab 04/06/14 1855  TROPONINI <0.30   BNP (last 3 results)  Recent Labs  03/27/14 1747  PROBNP 37853.0*   CBG:  Recent Labs Lab 03/31/14 0904 03/31/14 1137 04/06/14 1849 04/07/14 0106 04/07/14 0159  GLUCAP 90 233* 69* 63* 132*    Recent Results (from the past 240 hour(s))  Culture, blood (routine x 2)     Status: None   Collection Time:  03/30/14  5:00 PM  Result Value Ref Range Status   Specimen Description BLOOD LEFT HAND  Final   Special Requests BOTTLES DRAWN AEROBIC ONLY 2CC  Final   Culture  Setup Time   Final    03/30/2014 22:26 Performed at [place]   Final    NO GROWTH 5 DAYS Performed at [facility]    Report Status 04/05/2014 FINAL  Final  Culture, blood (routine x 2)     Status: None   Collection Time: 03/30/14  5:06 PM  Result Value Ref Range Status   Specimen Description BLOOD LEFT ARM  Final   Special Requests BOTTLES DRAWN AEROBIC ONLY 3.5CC  Final   Culture  Setup Time   Final    03/30/2014 22:20 Performed at [place]   Final    NO GROWTH 5  DAYS Performed at [facility]    Report Status 04/05/2014 FINAL  Final     Studies: Dg Chest 2 View  04/06/2014   CLINICAL DATA:  Fever for 1 week.  EXAM: CHEST  2 VIEW  COMPARISON:  03/27/2014  FINDINGS: Dense airspace disease at the left base. In the setting of fever this is most consistent with pneumonia. No effusion or cavitation. There may also be mild airspace opacity at the peripheral right base. There is cardiomegaly which is stable from previous. Negative aortic and hilar contours. Status post central venous stenting.  IMPRESSION: Left lower lobe pneumonia.   Electronically Signed   By: Jorje Guild M.D.   On: 04/06/2014 20:14   Ct Head Wo Contrast  04/06/2014   CLINICAL DATA:  Headache beginning today.  Fever.  EXAM: CT HEAD WITHOUT CONTRAST  TECHNIQUE: Contiguous axial images were obtained from the base of the skull through the vertex without intravenous contrast.  COMPARISON:  Head CT scan 07/19/2010.  FINDINGS: There is some cortical atrophy which is unchanged in appearance. No evidence of acute intracranial abnormality including infarct, hemorrhage, mass lesion, mass effect, midline shift or abnormal extra-axial fluid collection is identified. There is no hydrocephalus or pneumocephalus. The calvarium is intact. There is near complete opacification of left maxillary sinus. Ethmoid air cell disease is also seen on the left. Carotid atherosclerosis is noted.  IMPRESSION: No acute intracranial abnormality.  Near-complete opacification of the left maxillary sinus and mild left ethmoid air cell disease.   Electronically Signed   By: Inge Rise M.D.   On: 04/06/2014 21:07    Scheduled Meds: . aspirin EC  81 mg Oral Daily  . atorvastatin  40 mg Oral Daily  . azaTHIOprine  75 mg Oral q morning - 10a  . calcium acetate  667 mg Oral TID WC  . [START ON 04/08/2014] ceftAZIDime (FORTAZ) IVPB 2 gram/50 mL D5W (Pyxis)  2 g Intravenous Q M,W,F-HD  . colesevelam  3,750 mg Oral  Daily  . heparin  5,000 Units Subcutaneous 3 times per day  . hydrocortisone sod succinate (SOLU-CORTEF) inj  50 mg Intravenous Q8H  . multivitamin  1 tablet Oral QHS  . [START ON 04/08/2014] predniSONE  10 mg Oral Daily  . sodium chloride  3 mL Intravenous Q12H  . tacrolimus  1 mg Oral BID  . [START ON 04/08/2014] Vancomycin  750 mg Intravenous Q M,W,F-HD   Continuous Infusions:  Antibiotics Given (last 72 hours)    None      Principal Problem:   Fever, unknown origin Active Problems:  Heart transplanted   Dyslipidemia   DM type 2 causing complication   CKD (chronic kidney disease), stage V   ESRD on dialysis   Fever of undetermined origin    Time spent: 35 min    Armiyah Capron DO  [facility] Pager [phone]. If 7PM-7AM, please contact night-coverage at [email or web address], password [facility] 04/07/2014, 8:06 AM  LOS: 1 day

## 2014-04-07 NOTE — ED Notes (Signed)
Patient taken upstairs by Starleen Blue, RN

## 2014-04-07 NOTE — ED Notes (Signed)
Dr Betsey Holiday given a copy of troponin results .[place]

## 2014-04-07 NOTE — Consult Note (Signed)
[facility] Renal Consultation Note  Indication for Consultation:  Management of ESRD/hemodialysis; anemia, hypertension/volume and secondary hyperparathyroidism  HPI: Brandon Sharp is a 68 y.o. male admitted with LL PNA. He attended HD yesterday [facility] on M[facility] schedule/ seen by Dr. Lorrene Reid  With EDW ^ , (had prior admit with PNA [facility] nov 1- Mar 31 2014 dc was on op HD Vanco/ [place] pending BC drawn at [facility]) drove himself home felt weak/confused EMS called. In er febrile/ Persistent infiltrate on cxr admitted with ID consult planned. 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 Now feels better this am.      Past Medical History  Diagnosis Date  . Diabetes mellitus   . Hypertension   . Myocardial infarction 1985; 1990  . Angina   . Coronary artery disease   . Dysrhythmia   . DVT (deep venous thrombosis) ~ 12/2010    LLE  . Anemia   . Blood transfusion 06/1999    post heart transplant  . Peripheral vascular disease   . CHF (congestive heart failure) 2001    beffore transplant  . Renal failure     Hemodialysis MWF last 2 years, sse dr [name] nephrology, goes to [facility] center    Past Surgical History  Procedure Laterality Date  . Nephrectomy transplanted organ  2008  . Av fistula repair      rt. a=fore arm fistula  . Av fistula placement  ~ 01/2010    "this was my 2nd fistula placement"  . Heart transplant  06/1999  . Coronary angioplasty with stent placement  1985; 1990  . Colonoscopy  03/17/2012    Procedure: COLONOSCOPY;  Surgeon: Lear Ng, MD;  Location: [facility];  Service: Endoscopy;  Laterality: N/A;  . Coronary artery bypass graft  1990    CABG X3  . Colonoscopy with propofol N/A 12/14/2013    Procedure: COLONOSCOPY WITH PROPOFOL;  Surgeon: Lear Ng, MD;  Location: [facility];  Service: Endoscopy;  Laterality: N/A;      Family History  Problem Relation Age of Onset  . Heart disease Mother   . Hyperlipidemia Mother   . Hypertension Mother   . Hyperlipidemia Father   . Hypertension Father   . Diabetes Brother   . Heart disease Brother     Heart Disease before age 99  . Hyperlipidemia Brother   . Hypertension Brother   social = lives at home at wife    reports that he quit smoking about 25 years ago. His smoking use included Cigarettes. He has a 20 pack-year smoking history. He has never used smokeless tobacco. He reports that he drinks alcohol. He reports that he does not use illicit drugs.   Allergies  Allergen Reactions  . Lisinopril Swelling    Lips and tongue swell  . Norvasc [Amlodipine Besylate] Rash    Flushing  . Penicillins Rash    Prior to Admission medications   Medication Sig Start Date End Date Taking? Authorizing Provider  aspirin EC 81 MG tablet Take 81 mg by mouth daily.     Yes Historical Provider, MD  atorvastatin (LIPITOR) 40 MG tablet Take 40 mg by mouth daily.     Yes Historical Provider, MD  azaTHIOprine (IMURAN) 50 MG tablet Take 75 mg by  mouth every morning.    Yes Historical Provider, MD  b complex-vitamin c-folic acid (NEPHRO-VITE) 0.8 MG TABS tablet Take 1 tablet by mouth at bedtime.   Yes Historical Provider, MD  calcium acetate (PHOSLO) 667 MG capsule Take 667 mg by mouth 3 (three) times daily with meals.  11/01/12  Yes Historical Provider, MD  carvedilol (COREG) 25 MG tablet Take 25 mg by mouth at bedtime.   Yes Historical Provider, MD  colesevelam (WELCHOL) 625 MG tablet Take 3,750 mg by mouth daily. Takes 6 tabs daily   Yes Historical Provider, MD  famotidine (PEPCID AC) 10 MG chewable tablet Chew 10 mg by mouth at bedtime.    Yes Historical Provider, MD  HUMALOG KWIKPEN 100 UNIT/ML SOPN Inject 6-10 Units into the skin 2 (two) times daily. Per sliding scale 11/17/12  Yes Historical  Provider, MD  insulin glargine (LANTUS) 100 UNIT/ML injection Inject 20 Units into the skin every morning.   Yes Historical Provider, MD  isosorbide dinitrate (ISORDIL) 40 MG tablet Take 40 mg by mouth 3 (three) times daily.     Yes Historical Provider, MD  predniSONE (DELTASONE) 1 MG tablet Take 2 mg by mouth daily with breakfast.   Yes Historical Provider, MD  sirolimus (RAPAMUNE) 2 MG tablet Take 2 mg by mouth daily.   Yes Historical Provider, MD  cefTAZidime 2 g in dextrose 5 % 50 mL Inject 2 g into the vein every Monday, Wednesday, and Friday with hemodialysis. X 1 week 04/01/14   Ripudeep Krystal Eaton, MD  Vancomycin (VANCOCIN) 750 MG/150ML SOLN Inject 150 mLs (750 mg total) into the vein every Monday, Wednesday, and Friday with hemodialysis. X 1 week 03/31/14   Ripudeep Krystal Eaton, MD     Anti-infectives    Start     Dose/Rate Route Frequency Ordered Stop   04/08/14 1200  cefTAZidime (FORTAZ) 2 g in dextrose 5 % 50 mL IVPB     2 g100 mL/hr over 30 Minutes Intravenous Every M-W-F (Hemodialysis) 04/07/14 0123     04/08/14 1200  vancomycin (VANCOCIN) IVPB 750 mg/150 ml premix     750 mg150 mL/hr over 60 Minutes Intravenous Every M-W-F (Hemodialysis) 04/07/14 0123     04/07/14 0145  vancomycin (VANCOCIN) 500 mg in sodium chloride 0.9 % 100 mL IVPB  Status:  Discontinued     500 mg100 mL/hr over 60 Minutes Intravenous  Once 04/07/14 0132 04/07/14 0133   04/07/14 0123  ceFEPIme (MAXIPIME) 1 g in dextrose 5 % 50 mL IVPB  Status:  Discontinued     1 g100 mL/hr over 30 Minutes Intravenous Every 12 hours 04/07/14 0123 04/07/14 0138   04/06/14 2015  vancomycin (VANCOCIN) IVPB 1000 mg/200 mL premix     1,000 mg200 mL/hr over 60 Minutes Intravenous  Once 04/06/14 2011 04/06/14 2204   04/06/14 2015  piperacillin-tazobactam (ZOSYN) IVPB 3.375 g     3.375 g100 mL/hr over 30 Minutes Intravenous  Once 04/06/14 2011 04/06/14 2105      Results for orders placed or performed during the hospital encounter of 04/06/14  (from the past 48 hour(s))  CBG monitoring, ED     Status: Abnormal   Collection Time: 04/06/14  6:49 PM  Result Value Ref Range   Glucose-Capillary 69 (L) 70 - 99 mg/dL   Comment 1 Notify RN   Hepatic function panel     Status: Abnormal   Collection Time: 04/06/14  6:55 PM  Result Value Ref Range   Total Protein 7.1 6.0 -  8.3 g/dL   Albumin 2.6 (L) 3.5 - 5.2 g/dL   AST 47 (H) 0 - 37 U/L    Comment: HEMOLYSIS AT THIS LEVEL MAY AFFECT RESULT   ALT 17 0 - 53 U/L    Comment: HEMOLYSIS AT THIS LEVEL MAY AFFECT RESULT   Alkaline Phosphatase 62 39 - 117 U/L   Total Bilirubin 0.3 0.3 - 1.2 mg/dL   Bilirubin, Direct <0.2 0.0 - 0.3 mg/dL   Indirect Bilirubin NOT CALCULATED 0.3 - 0.9 mg/dL  Troponin I     Status: None   Collection Time: 04/06/14  6:55 PM  Result Value Ref Range   Troponin I <0.30 <0.30 ng/mL    Comment:        Due to the release kinetics of cTnI, a negative result within the first hours of the onset of symptoms does not rule out myocardial infarction with certainty. If myocardial infarction is still suspected, repeat the test at appropriate intervals.   CBC     Status: Abnormal   Collection Time: 04/06/14  7:00 PM  Result Value Ref Range   WBC 5.2 4.0 - 10.5 K/uL   RBC 3.22 (L) 4.22 - 5.81 MIL/uL   Hemoglobin 10.3 (L) 13.0 - 17.0 g/dL   HCT 31.4 (L) 39.0 - 52.0 %   MCV 97.5 78.0 - 100.0 fL   MCH 32.0 26.0 - 34.0 pg   MCHC 32.8 30.0 - 36.0 g/dL   RDW 14.9 11.5 - 15.5 %   Platelets 125 (L) 150 - 400 K/uL  Basic metabolic panel     Status: Abnormal   Collection Time: 04/06/14  7:00 PM  Result Value Ref Range   Sodium 139 137 - 147 mEq/L   Potassium 3.5 (L) 3.7 - 5.3 mEq/L    Comment: HEMOLYSIS AT THIS LEVEL MAY AFFECT RESULT   Chloride 90 (L) 96 - 112 mEq/L   CO2 28 19 - 32 mEq/L   Glucose, Bld 75 70 - 99 mg/dL   BUN 16 6 - 23 mg/dL   Creatinine, Ser 3.90 (H) 0.50 - 1.35 mg/dL   Calcium 9.3 8.4 - 10.5 mg/dL   GFR calc non Af Amer 14 (L) >90 mL/min   GFR  calc Af Amer 17 (L) >90 mL/min    Comment: (NOTE) The eGFR has been calculated using the CKD EPI equation. This calculation has not been validated in all clinical situations. eGFR's persistently <90 mL/min signify possible Chronic Kidney Disease.    Anion gap 21 (H) 5 - 15  I-Stat CG4 Lactic Acid, ED     Status: Abnormal   Collection Time: 04/06/14  7:06 PM  Result Value Ref Range   Lactic Acid, Venous 3.34 (H) 0.5 - 2.2 mmol/L  I-stat troponin, ED (not at [facility])     Status: Abnormal   Collection Time: 04/06/14  7:22 PM  Result Value Ref Range   Troponin i, poc 0.14 (HH) 0.00 - 0.08 ng/mL   Comment NOTIFIED PHYSICIAN    Comment 3            Comment: Due to the release kinetics of cTnI, a negative result within the first hours of the onset of symptoms does not rule out myocardial infarction with certainty. If myocardial infarction is still suspected, repeat the test at appropriate intervals.   I-stat troponin, ED     Status: Abnormal   Collection Time: 04/07/14 12:30 AM  Result Value Ref Range   Troponin i, poc 0.32 (HH) 0.00 - 0.08  ng/mL   Comment NOTIFIED PHYSICIAN    Comment 3            Comment: Due to the release kinetics of cTnI, a negative result within the first hours of the onset of symptoms does not rule out myocardial infarction with certainty. If myocardial infarction is still suspected, repeat the test at appropriate intervals.   Glucose, capillary     Status: Abnormal   Collection Time: 04/07/14  1:06 AM  Result Value Ref Range   Glucose-Capillary 63 (L) 70 - 99 mg/dL  Glucose, capillary     Status: Abnormal   Collection Time: 04/07/14  1:59 AM  Result Value Ref Range   Glucose-Capillary 132 (H) 70 - 99 mg/dL  CBC     Status: Abnormal   Collection Time: 04/07/14  3:00 AM  Result Value Ref Range   WBC 4.9 4.0 - 10.5 K/uL   RBC 2.83 (L) 4.22 - 5.81 MIL/uL   Hemoglobin 8.8 (L) 13.0 - 17.0 g/dL   HCT 28.1 (L) 39.0 - 52.0 %   MCV 99.3 78.0 - 100.0 fL   MCH  31.1 26.0 - 34.0 pg   MCHC 31.3 30.0 - 36.0 g/dL   RDW 14.9 11.5 - 15.5 %   Platelets 109 (L) 150 - 400 K/uL    Comment: REPEATED TO VERIFY SPECIMEN CHECKED FOR CLOTS PLATELET COUNT CONFIRMED BY SMEAR   Creatinine, serum     Status: Abnormal   Collection Time: 04/07/14  3:00 AM  Result Value Ref Range   Creatinine, Ser 5.03 (H) 0.50 - 1.35 mg/dL   GFR calc non Af Amer 11 (L) >90 mL/min   GFR calc Af Amer 12 (L) >90 mL/min    Comment: (NOTE) The eGFR has been calculated using the CKD EPI equation. This calculation has not been validated in all clinical situations. eGFR's persistently <90 mL/min signify possible Chronic Kidney Disease.   TSH     Status: None   Collection Time: 04/07/14  3:00 AM  Result Value Ref Range   TSH 1.960 0.350 - 4.500 uIU/mL  MRSA PCR Screening     Status: None   Collection Time: 04/07/14  7:02 AM  Result Value Ref Range   MRSA by PCR NEGATIVE NEGATIVE    Comment:        The GeneXpert MRSA Assay (FDA approved for NASAL specimens only), is one component of a comprehensive MRSA colonization surveillance program. It is not intended to diagnose MRSA infection nor to guide or monitor treatment for MRSA infections.   Glucose, capillary     Status: Abnormal   Collection Time: 04/07/14  7:52 AM  Result Value Ref Range   Glucose-Capillary 351 (H) 70 - 99 mg/dL   Comment 1 Documented in Chart    Comment 2 Notify RN      ROS: See hpi for positives  Physical Exam: Filed Vitals:   04/07/14 0750  BP: 112/54  Pulse:   Temp:   Resp: 12     General: Alert pleasant adult  BM NAD Appropriate/ OX 4 now HEENT: Laplace nonicteric, MMM Neck: no jvd Heart: RRR no rub, mur , or gallop Lungs: CTA nonlabored breathing  Abdomen: BS + , SOFT, nt, nd Extremities: no pedal edema Skin: no overt rash or pedal ulcers Neuro: Alert/ OX 4, moves all extrem , No acute focal gross deficits Dialysis Access: Pos bruit RUA AVF  Dialysis Orders: [facility]: [facility]  on MWF . EDW  71.5 kg  HD [employer]  2.0 k, 2.25 ca  Time 4hr Heparin 3000. Access  RUA AVF BFR 400 DFR 800    Hectorol 2 mcg IV/HD  Aranesp 58mg   Units IV/HD  Venofer  0  Other  Ipth 215/ ca 10.1/phos 3.8 op labs  03/23/14 hgb 9.8 03/30/14  Assessment/Plan 1. Recurrent PNA in Cardiac Tx / Esrd pt= admit team rx on Vanc/ Zosyn/ fortaz / BC on admit    ID to see 2. ESRD -  Continue MWF HD 3. Hypertension/volume  - Stable bp  112/54/  Yesterday  edw ^ from prior admit no excess vol on exam /  4. Anemia  - contiue esa hgb8.8  Was 625m op will ^ 100 5. Metabolic bone disease -  Phos lo binders / iv vit d on hd 6. Nutrition - renal vit / renal carb mod diet 7. Cardiac TX (2001)- stable  Continue  meds 8. IDDM- per admit  DaErnest HaberPA-C [id number]/04/2014, 10:07 AM

## 2014-04-07 NOTE — Progress Notes (Signed)
Utilization review completed.  

## 2014-04-07 NOTE — Progress Notes (Signed)
Results for THEOPHILE, HARVIE (MRN [id number]) as of 04/07/2014 09:29  Ref. Range 04/07/2014 01:06 04/07/2014 01:59 04/07/2014 03:00 04/07/2014 07:02 04/07/2014 07:52  Glucose-Capillary Latest Range: 70-99 mg/dL 63 (L) 132 (H)   351 (H)  CBGs elevated this am.  Recommend starting Lantus 10-15 units daily (Lantus 20 units daily is patient's home dose) and Novolog SENSITIVE correction scale TID & HS.  Will continue to follow while in hospital.  Harvel Ricks RN BSN CDE

## 2014-04-07 NOTE — Plan of Care (Signed)
Problem: Phase I Progression Outcomes Goal: Dyspnea controlled at rest Outcome: Completed/Met Date Met:  04/07/14 Goal: Pain controlled with appropriate interventions Outcome: Completed/Met Date Met:  04/07/14 Goal: Confirm chest x-ray completed Outcome: Completed/Met Date Met:  04/07/14 Goal: Hemodynamically stable Outcome: Completed/Met Date Met:  04/07/14

## 2014-04-07 NOTE — Progress Notes (Signed)
Hypoglycemic Event  CBG: 63  Treatment: 15 GM carbohydrate snack  Symptoms: Hungry, confusion  Follow-up CBG: Time:0200 CBG Result: 132  Possible Reasons for Event: Inadequate meal intake  Comments/MD notified:    Lizzette Carbonell  Remember to initiate Hypoglycemia Order Set & complete

## 2014-04-07 NOTE — Consult Note (Signed)
[facility] for Infectious Disease    Date of Admission:  04/06/2014  Date of Consult:  04/07/2014  Reason for Consult: fever of unknown origin Referring Physician: Dr. Eliseo Squires   HPI: Brandon Sharp is an 68 y.o. male whose past medical history is significant for heart transplantation in 2001, failed kidney transplant in 2008 on chronic immunosuppressive drugs with a past history of complications of his transplant including invasive aspergillosis of the lung in 2009 was treated with voriconazole.  The patient had been admitted on November 1 with symptoms of a weakness that been going on for 2 weeks as well as nausea vomiting and a fever with a temperature up to 102. He had blood cultures taken and a chest x-ray performed which it showed a few patchy opacities of the left lower lobe. He had received already azithromycin from his primary care physician but was continued to feel worse. After mission on the first he was treated with vancomycin and cefepime from November 1 through November 5. Interestingly towards the end of his hospital course he became febrile again to 101.9 and 102.1 on November 4. NO set of blood cultures were performed that day the fourth before his discharge on the fifth. He was then changed over to vancomycin and cefepime to vancomycin and Ceptaz edema which she continued on hemodialysis. Patient states that he did not feel well enough to go home when he did go home. He then improved slightly but then again began to feel poorly. Specifically again began to complain of severe fatigue particularly during dialysis along with subjective fevers he has had a light nonproductive cough but is unchanged in nature he denies abdominal pain nausea vomiting myalgias joint pains. He makes minimal urine and has had no suprapubic pain.  He denies diarrhea he denies trouble swallowing.  He denies headaches but has noted transient spots in his left visual field superiorly and left temporal  field x 3 weeks. He that yesterday during dialysis he said he just felt really really bad" he had hemodialysis terminated early by 40 minutes and also he came to the hospital. Chest x-ray done in the [facility] department showed him to have a left lower lobe and persistent infiltrate. Blood cultures were drawn and the patient has again been given vancomycin and Zosyn, now returned to vancomycin and cefepime.       Past Medical History  Diagnosis Date  . Diabetes mellitus   . Hypertension   . Myocardial infarction 1985; 1990  . Angina   . Coronary artery disease   . Dysrhythmia   . DVT (deep venous thrombosis) ~ 12/2010    LLE  . Anemia   . Blood transfusion 06/1999    post heart transplant  . Peripheral vascular disease   . CHF (congestive heart failure) 2001    beffore transplant  . Renal failure     Hemodialysis MWF last 2 years, sse dr [name] nephrology, goes to [facility]    Past Surgical History  Procedure Laterality Date  . Nephrectomy transplanted organ  2008  . Av fistula repair      rt. a=fore arm fistula  . Av fistula placement  ~ 01/2010    "this was my 2nd fistula placement"  . Heart transplant  06/1999  . Coronary angioplasty with stent placement  1985; 1990  . Colonoscopy  03/17/2012    Procedure: COLONOSCOPY;  Surgeon: Lear Ng, MD;  Location: [facility];  Service: Endoscopy;  Laterality:  N/A;  . Coronary artery bypass graft  1990    CABG X3  . Colonoscopy with propofol N/A 12/14/2013    Procedure: COLONOSCOPY WITH PROPOFOL;  Surgeon: Lear Ng, MD;  Location: [facility];  Service: Endoscopy;  Laterality: N/A;  ergies:   Allergies  Allergen Reactions  . Lisinopril Swelling    Lips and tongue swell  . Norvasc [Amlodipine Besylate] Rash    Flushing  . Penicillins Rash     Medications: I have reviewed patients current medications as documented in Epic Anti-infectives    Start     Dose/Rate Route Frequency Ordered  Stop   04/08/14 1200  cefTAZidime (FORTAZ) 2 g in dextrose 5 % 50 mL IVPB     2 g100 mL/hr over 30 Minutes Intravenous Every M-W-F (Hemodialysis) 04/07/14 0123     04/08/14 1200  vancomycin (VANCOCIN) IVPB 750 mg/150 ml premix     750 mg150 mL/hr over 60 Minutes Intravenous Every M-W-F (Hemodialysis) 04/07/14 0123     04/07/14 0145  vancomycin (VANCOCIN) 500 mg in sodium chloride 0.9 % 100 mL IVPB  Status:  Discontinued     500 mg100 mL/hr over 60 Minutes Intravenous  Once 04/07/14 0132 04/07/14 0133   04/07/14 0123  ceFEPIme (MAXIPIME) 1 g in dextrose 5 % 50 mL IVPB  Status:  Discontinued     1 g100 mL/hr over 30 Minutes Intravenous Every 12 hours 04/07/14 0123 04/07/14 0138   04/06/14 2015  vancomycin (VANCOCIN) IVPB 1000 mg/200 mL premix     1,000 mg200 mL/hr over 60 Minutes Intravenous  Once 04/06/14 2011 04/06/14 2204   04/06/14 2015  piperacillin-tazobactam (ZOSYN) IVPB 3.375 g     3.375 g100 mL/hr over 30 Minutes Intravenous  Once 04/06/14 2011 04/06/14 2105      Social History:  reports that he quit smoking about 25 years ago. His smoking use included Cigarettes. He has a 20 pack-year smoking history. He has never used smokeless tobacco. He reports that he drinks alcohol. He reports that he does not use illicit drugs.  Family History  Problem Relation Age of Onset  . Heart disease Mother   . Hyperlipidemia Mother   . Hypertension Mother   . Hyperlipidemia Father   . Hypertension Father   . Diabetes Brother   . Heart disease Brother     Heart Disease before age 77  . Hyperlipidemia Brother   . Hypertension Brother     As in HPI and primary teams notes otherwise 12 point review of systems is negative  Blood pressure 112/54, pulse 83, temperature 98 F (36.7 C), temperature source Oral, resp. rate 12, height 6' (1.829 m), weight 152 lb 5.4 oz (69.1 kg), SpO2 96 %. General: Alert and awake, oriented x3, not in any acute distress. HEENT: anicteric sclera, pupils reactive to  light and accommodation, EOMI, oropharynx clear and without exudate no thrush CVS regular rate, normal r,  no murmur rubs or gallops Chest: Crackles at the bases bilaterally but otherwise clearly clear Abdomen: soft nontender, nondistended, normal bowel sounds, Extremities: His AV fistula site is clean and without any evidence of purulence or erythema thrills palpable Skin: no rashes Neuro: nonfocal, strength and sensation intact   Results for orders placed or performed during the hospital encounter of 04/06/14 (from the past 48 hour(s))  CBG monitoring, ED     Status: Abnormal   Collection Time: 04/06/14  6:49 PM  Result Value Ref Range   Glucose-Capillary 69 (L) 70 - 99 mg/dL  Comment 1 Notify RN   Hepatic function panel     Status: Abnormal   Collection Time: 04/06/14  6:55 PM  Result Value Ref Range   Total Protein 7.1 6.0 - 8.3 g/dL   Albumin 2.6 (L) 3.5 - 5.2 g/dL   AST 47 (H) 0 - 37 U/L    Comment: HEMOLYSIS AT THIS LEVEL MAY AFFECT RESULT   ALT 17 0 - 53 U/L    Comment: HEMOLYSIS AT THIS LEVEL MAY AFFECT RESULT   Alkaline Phosphatase 62 39 - 117 U/L   Total Bilirubin 0.3 0.3 - 1.2 mg/dL   Bilirubin, Direct <0.2 0.0 - 0.3 mg/dL   Indirect Bilirubin NOT CALCULATED 0.3 - 0.9 mg/dL  Troponin I     Status: None   Collection Time: 04/06/14  6:55 PM  Result Value Ref Range   Troponin I <0.30 <0.30 ng/mL    Comment:        Due to the release kinetics of cTnI, a negative result within the first hours of the onset of symptoms does not rule out myocardial infarction with certainty. If myocardial infarction is still suspected, repeat the test at appropriate intervals.   CBC     Status: Abnormal   Collection Time: 04/06/14  7:00 PM  Result Value Ref Range   WBC 5.2 4.0 - 10.5 K/uL   RBC 3.22 (L) 4.22 - 5.81 MIL/uL   Hemoglobin 10.3 (L) 13.0 - 17.0 g/dL   HCT 31.4 (L) 39.0 - 52.0 %   MCV 97.5 78.0 - 100.0 fL   MCH 32.0 26.0 - 34.0 pg   MCHC 32.8 30.0 - 36.0 g/dL   RDW  14.9 11.5 - 15.5 %   Platelets 125 (L) 150 - 400 K/uL  Basic metabolic panel     Status: Abnormal   Collection Time: 04/06/14  7:00 PM  Result Value Ref Range   Sodium 139 137 - 147 mEq/L   Potassium 3.5 (L) 3.7 - 5.3 mEq/L    Comment: HEMOLYSIS AT THIS LEVEL MAY AFFECT RESULT   Chloride 90 (L) 96 - 112 mEq/L   CO2 28 19 - 32 mEq/L   Glucose, Bld 75 70 - 99 mg/dL   BUN 16 6 - 23 mg/dL   Creatinine, Ser 3.90 (H) 0.50 - 1.35 mg/dL   Calcium 9.3 8.4 - 10.5 mg/dL   GFR calc non Af Amer 14 (L) >90 mL/min   GFR calc Af Amer 17 (L) >90 mL/min    Comment: (NOTE) The eGFR has been calculated using the CKD EPI equation. This calculation has not been validated in all clinical situations. eGFR's persistently <90 mL/min signify possible Chronic Kidney Disease.    Anion gap 21 (H) 5 - 15  I-Stat CG4 Lactic Acid, ED     Status: Abnormal   Collection Time: 04/06/14  7:06 PM  Result Value Ref Range   Lactic Acid, Venous 3.34 (H) 0.5 - 2.2 mmol/L  I-stat troponin, ED (not at [facility])     Status: Abnormal   Collection Time: 04/06/14  7:22 PM  Result Value Ref Range   Troponin i, poc 0.14 (HH) 0.00 - 0.08 ng/mL   Comment NOTIFIED PHYSICIAN    Comment 3            Comment: Due to the release kinetics of cTnI, a negative result within the first hours of the onset of symptoms does not rule out myocardial infarction with certainty. If myocardial infarction is still suspected, repeat the test at  appropriate intervals.   I-stat troponin, ED     Status: Abnormal   Collection Time: 04/07/14 12:30 AM  Result Value Ref Range   Troponin i, poc 0.32 (HH) 0.00 - 0.08 ng/mL   Comment NOTIFIED PHYSICIAN    Comment 3            Comment: Due to the release kinetics of cTnI, a negative result within the first hours of the onset of symptoms does not rule out myocardial infarction with certainty. If myocardial infarction is still suspected, repeat the test at appropriate intervals.   Glucose, capillary      Status: Abnormal   Collection Time: 04/07/14  1:06 AM  Result Value Ref Range   Glucose-Capillary 63 (L) 70 - 99 mg/dL  Glucose, capillary     Status: Abnormal   Collection Time: 04/07/14  1:59 AM  Result Value Ref Range   Glucose-Capillary 132 (H) 70 - 99 mg/dL  CBC     Status: Abnormal   Collection Time: 04/07/14  3:00 AM  Result Value Ref Range   WBC 4.9 4.0 - 10.5 K/uL   RBC 2.83 (L) 4.22 - 5.81 MIL/uL   Hemoglobin 8.8 (L) 13.0 - 17.0 g/dL   HCT 28.1 (L) 39.0 - 52.0 %   MCV 99.3 78.0 - 100.0 fL   MCH 31.1 26.0 - 34.0 pg   MCHC 31.3 30.0 - 36.0 g/dL   RDW 14.9 11.5 - 15.5 %   Platelets 109 (L) 150 - 400 K/uL    Comment: REPEATED TO VERIFY SPECIMEN CHECKED FOR CLOTS PLATELET COUNT CONFIRMED BY SMEAR   Creatinine, serum     Status: Abnormal   Collection Time: 04/07/14  3:00 AM  Result Value Ref Range   Creatinine, Ser 5.03 (H) 0.50 - 1.35 mg/dL   GFR calc non Af Amer 11 (L) >90 mL/min   GFR calc Af Amer 12 (L) >90 mL/min    Comment: (NOTE) The eGFR has been calculated using the CKD EPI equation. This calculation has not been validated in all clinical situations. eGFR's persistently <90 mL/min signify possible Chronic Kidney Disease.   TSH     Status: None   Collection Time: 04/07/14  3:00 AM  Result Value Ref Range   TSH 1.960 0.350 - 4.500 uIU/mL  HIV antibody     Status: None   Collection Time: 04/07/14  3:00 AM  Result Value Ref Range   HIV 1&2 Ab, 4th Generation NONREACTIVE NONREACTIVE    Comment: (NOTE) A NONREACTIVE HIV Ag/Ab result does not exclude HIV infection since the time frame for seroconversion is variable. If acute HIV infection is suspected, a HIV-1 RNA Qualitative TMA test is recommended. HIV-1/2 Antibody Diff         Not indicated. HIV-1 RNA, Qual TMA           Not indicated. PLEASE NOTE: This information has been disclosed to you from records whose confidentiality may be protected by state law. If your state requires such protection, then the  state law prohibits you from making any further disclosure of the information without the specific written consent of the person to whom it pertains, or as otherwise permitted by law. A general authorization for the release of medical or other information is NOT sufficient for this purpose. The performance of this assay has not been clinically validated in patients less than 30 years old. Performed at [facility]   EBV ab to viral capsid ag pnl, IgG+IgM  Status: Abnormal   Collection Time: 04/07/14  3:00 AM  Result Value Ref Range   EBV VCA IgG 191.0 (H) <18.0 U/mL    Comment: (NOTE) Reference Range:       <18.0 U/mL = Negative                   18.0-21.9 U/mL = Equivocal                      >=22.0 U/mL = Positive    EBV VCA IgM <10.0 <36.0 U/mL    Comment: (NOTE) Reference Range:       <36.0 U/mL = Negative                   36.0-43.9 U/mL = Equivocal                      >=44.0 U/mL = Positive       Clinical Stage            VCA IgG   VCA IgM      EA    EBV NA       Susceptibility               -         -         -       -       Very Early Infection        +/-       +/-        -       -       Established Infection        +         +        +/-      -       Recent Infection             +         +        +/-     +/-       Past Infection               +         -        +/-      +                                                                             +/- means positive or negative (not weak) High persisting antibody levels may be present in Burkitt's lymphoma and nasopharyngeal carcinoma. Performed at [place] PCR Screening     Status: None   Collection Time: 04/07/14  7:02 AM  Result Value Ref Range   MRSA by PCR NEGATIVE NEGATIVE    Comment:        The GeneXpert MRSA Assay (FDA approved for NASAL specimens only), is one component of a comprehensive MRSA colonization surveillance program. It is not intended to diagnose MRSA infection  nor to guide or monitor treatment for MRSA infections.   Glucose, capillary  Status: Abnormal   Collection Time: 04/07/14  7:52 AM  Result Value Ref Range   Glucose-Capillary 351 (H) 70 - 99 mg/dL   Comment 1 Documented in Chart    Comment 2 Notify RN   Glucose, capillary     Status: Abnormal   Collection Time: 04/07/14 11:59 AM  Result Value Ref Range   Glucose-Capillary 322 (H) 70 - 99 mg/dL   Comment 1 Documented in Chart    Comment 2 Notify RN    @[email or web address] (sdes,specrequest,cult,reptstatus)   ) Recent Results (from the past 720 hour(s))  Blood culture (routine x 2)     Status: None   Collection Time: 03/27/14  5:51 PM  Result Value Ref Range Status   Specimen Description BLOOD LEFT ARM  Final   Special Requests BOTTLES DRAWN AEROBIC AND ANAEROBIC 5CC  Final   Culture  Setup Time   Final    03/28/2014 02:25 Performed at [place]   Final    NO GROWTH 5 DAYS Performed at [facility]    Report Status 04/04/2014 FINAL  Final  Blood culture (routine x 2)     Status: None   Collection Time: 03/27/14  6:20 PM  Result Value Ref Range Status   Specimen Description BLOOD LEFT ARM  Final   Special Requests BOTTLES DRAWN AEROBIC AND ANAEROBIC 10CC  Final   Culture  Setup Time   Final    03/28/2014 02:25 Performed at [place]   Final    NO GROWTH 5 DAYS Performed at [facility]    Report Status 04/04/2014 FINAL  Final  Culture, blood (routine x 2)     Status: None   Collection Time: 03/30/14  5:00 PM  Result Value Ref Range Status   Specimen Description BLOOD LEFT HAND  Final   Special Requests BOTTLES DRAWN AEROBIC ONLY 2CC  Final   Culture  Setup Time   Final    03/30/2014 22:26 Performed at [facility]    Culture   Final    NO GROWTH 5 DAYS Performed at [facility]    Report Status 04/05/2014 FINAL  Final  Culture, blood (routine x 2)     Status: None   Collection Time:  03/30/14  5:06 PM  Result Value Ref Range Status   Specimen Description BLOOD LEFT ARM  Final   Special Requests BOTTLES DRAWN AEROBIC ONLY 3.5CC  Final   Culture  Setup Time   Final    03/30/2014 22:20 Performed at [place]   Final    NO GROWTH 5 DAYS Performed at [facility]    Report Status 04/05/2014 FINAL  Final  MRSA PCR Screening     Status: None   Collection Time: 04/07/14  7:02 AM  Result Value Ref Range Status   MRSA by PCR NEGATIVE NEGATIVE Final    Comment:        The GeneXpert MRSA Assay (FDA approved for NASAL specimens only), is one component of a comprehensive MRSA colonization surveillance program. It is not intended to diagnose MRSA infection nor to guide or monitor treatment for MRSA infections.      Impression/Recommendation  Principal Problem:   Fever, unknown origin Active Problems:   Heart transplanted   Dyslipidemia   DM type 2 causing complication   CKD (chronic kidney disease), stage V   ESRD on dialysis   Fever of undetermined  origin   SENAY SISTRUNK is a 68 y.o. male with history of heart transplantation in 2001 kidney transplant in 2008 that failed currently on hemodialysis Monday Wednesday Friday with continued immunosuppressive therapy with sirolimus, azathioprine, and prednisone who returns to the hospital with fever and malaise and similar symptoms once he presented on November 1 despite being treated with fairly broad-spectrum healthcare associated pneumonia antibiotics in the form of vancomycin and ceftazidime. His influenza PCR was negative on the 1st.   #1 Fever of unknown origin in immunocompromised patient:  --CT chest abdomen and pelvis with oral and IV Address as you're doing to look for occult infection as well as other potential causes of fever including transplant rejection --Follow-up blood cultures --Check CMV quantitative PCR in blood --if he does have significant pulmonary disease he  will likely need a bronchoscopy from Critical care medicine with deep cultures sent for fungal cultures, cultures for Nocardia as well as nontuberculous mycobacteria and Legionella and bacterial cultures and a respiratory virus panel  --we can check a serum cryptococcal antigen --If he has enough urine we can check a urine Legionella antigen along with a urine histoplasma antigen  #2 Floaters in visual fields:  ---Given the fact that he is severely immunocompromised and that no one has examined his eyes I would recommend a formal ophthalmologic evaluation to ensure he does not have CMV retinitis for example     04/07/2014, 4:00 PM   Thank you so much for this interesting consult  [facility] [place] [phone] (pager) [phone] (office) 04/07/2014, 4:00 PM  [place] 04/07/2014, 4:00 PM

## 2014-04-07 NOTE — Progress Notes (Signed)
ANTIBIOTIC CONSULT NOTE - INITIAL  Pharmacy Consult for vancomycin Indication: pneumonia  Allergies  Allergen Reactions  . Lisinopril Swelling    Lips and tongue swell  . Norvasc [Amlodipine Besylate] Rash    Flushing  . Penicillins Rash    Patient Measurements: Height: 6' (182.9 cm) Weight: 152 lb 5.4 oz (69.1 kg) IBW/kg (Calculated) : 77.6  Vital Signs: Temp: 98.7 F (37.1 C) (11/12 0101) Temp Source: Oral (11/12 0101) BP: 145/67 mmHg (11/12 0101) Pulse Rate: 91 (11/12 0015)  Labs:  Recent Labs  04/06/14 1900  WBC 5.2  HGB 10.3*  PLT 125*  CREATININE 3.90*   Estimated Creatinine Clearance: 17.5 mL/min (by C-G formula based on Cr of 3.9).   Microbiology: Recent Results (from the past 720 hour(s))  Blood culture (routine x 2)     Status: None   Collection Time: 03/27/14  5:51 PM  Result Value Ref Range Status   Specimen Description BLOOD LEFT ARM  Final   Special Requests BOTTLES DRAWN AEROBIC AND ANAEROBIC 5CC  Final   Culture  Setup Time   Final    03/28/2014 02:25 Performed at [facility]    Culture   Final    NO GROWTH 5 DAYS Performed at [facility]    Report Status 04/04/2014 FINAL  Final  Blood culture (routine x 2)     Status: None   Collection Time: 03/27/14  6:20 PM  Result Value Ref Range Status   Specimen Description BLOOD LEFT ARM  Final   Special Requests BOTTLES DRAWN AEROBIC AND ANAEROBIC 10CC  Final   Culture  Setup Time   Final    03/28/2014 02:25 Performed at [facility]   Final    NO GROWTH 5 DAYS Performed at [facility]    Report Status 04/04/2014 FINAL  Final  Culture, blood (routine x 2)     Status: None   Collection Time: 03/30/14  5:00 PM  Result Value Ref Range Status   Specimen Description BLOOD LEFT HAND  Final   Special Requests BOTTLES DRAWN AEROBIC ONLY 2CC  Final   Culture  Setup Time   Final    03/30/2014 22:26 Performed at [facility]    Culture    Final    NO GROWTH 5 DAYS Performed at [facility]    Report Status 04/05/2014 FINAL  Final  Culture, blood (routine x 2)     Status: None   Collection Time: 03/30/14  5:06 PM  Result Value Ref Range Status   Specimen Description BLOOD LEFT ARM  Final   Special Requests BOTTLES DRAWN AEROBIC ONLY 3.5CC  Final   Culture  Setup Time   Final    03/30/2014 22:20 Performed at [facility]   Final    NO GROWTH 5 DAYS Performed at [facility]    Report Status 04/05/2014 FINAL  Final    Medical History: Past Medical History  Diagnosis Date  . Diabetes mellitus   . Hypertension   . Myocardial infarction 1985; 1990  . Angina   . Coronary artery disease   . Dysrhythmia   . DVT (deep venous thrombosis) ~ 12/2010    LLE  . Anemia   . Blood transfusion 06/1999    post heart transplant  . Peripheral vascular disease   . CHF (congestive heart failure) 2001    beffore transplant  . Renal failure  Hemodialysis MWF last 2 years, sse dr [name] nephrology, [facility] to [facility]    Medications:  Prescriptions prior to admission  Medication Sig Dispense Refill Last Dose  . aspirin EC 81 MG tablet Take 81 mg by mouth daily.     04/05/2014 at Unknown time  . atorvastatin (LIPITOR) 40 MG tablet Take 40 mg by mouth daily.     04/05/2014 at Unknown time  . azaTHIOprine (IMURAN) 50 MG tablet Take 75 mg by mouth every morning.    04/05/2014 at Unknown time  . b complex-vitamin c-folic acid (NEPHRO-VITE) 0.8 MG TABS tablet Take 1 tablet by mouth at bedtime.   04/05/2014 at Unknown time  . calcium acetate (PHOSLO) 667 MG capsule Take 667 mg by mouth 3 (three) times daily with meals.    04/05/2014 at Unknown time  . carvedilol (COREG) 25 MG tablet Take 25 mg by mouth at bedtime.   04/05/2014 at 7a  . carvedilol (COREG) 25 MG tablet Take 50 mg by mouth 2 (two) times daily with a meal.   04/05/2014 at Unknown time  . colesevelam (WELCHOL) 625  MG tablet Take 3,750 mg by mouth daily. Takes 6 tabs daily   04/05/2014 at Unknown time  . famotidine (PEPCID AC) 10 MG chewable tablet Chew 10 mg by mouth daily as needed for heartburn.   04/05/2014 at Unknown time  . HUMALOG KWIKPEN 100 UNIT/ML SOPN Inject 6-10 Units into the skin 2 (two) times daily. Per sliding scale   04/06/2014 at Unknown time  . insulin glargine (LANTUS) 100 UNIT/ML injection Inject 20 Units into the skin every morning.   04/06/2014 at Unknown time  . isosorbide dinitrate (ISORDIL) 40 MG tablet Take 40 mg by mouth 3 (three) times daily.     04/05/2014 at Unknown time  . predniSONE (DELTASONE) 10 MG tablet Take 10 mg by mouth daily.     04/05/2014 at Unknown time  . sirolimus (RAPAMUNE) 2 MG tablet Take 2 mg by mouth daily.   04/05/2014 at Unknown time  . tacrolimus (PROGRAF) 0.5 MG capsule Take 0.5 mg by mouth 2 (two) times daily. Take with 1 mg to equal a total dose of 1.5 mg twice daily   04/05/2014 at Unknown time  . tacrolimus (PROGRAF) 1 MG capsule Take 1 mg by mouth 2 (two) times daily. Patient to take with 0.5 mg to equal a total dose of 1.5 mg twice daily   04/05/2014 at Unknown time  . cefTAZidime 2 g in dextrose 5 % 50 mL Inject 2 g into the vein every Monday, Wednesday, and Friday with hemodialysis. X 1 week     . Vancomycin (VANCOCIN) 750 MG/150ML SOLN Inject 150 mLs (750 mg total) into the vein every Monday, Wednesday, and Friday with hemodialysis. X 1 week      Scheduled:  . aspirin EC  81 mg Oral Daily  . atorvastatin  40 mg Oral Daily  . azaTHIOprine  75 mg Oral q morning - 10a  . calcium acetate  667 mg Oral TID WC  . ceFEPime (MAXIPIME) IV  1 g Intravenous Q12H  . [START ON 04/08/2014] ceftAZIDime (FORTAZ) IVPB 2 gram/50 mL D5W (Pyxis)  2 g Intravenous Q M,W,F-HD  . colesevelam  3,750 mg Oral Daily  . heparin  5,000 Units Subcutaneous 3 times per day  . hydrocortisone sod succinate (SOLU-CORTEF) inj  50 mg Intravenous Q8H  . multivitamin  1 tablet Oral  QHS  . predniSONE  10 mg Oral  Daily  . sodium chloride  3 mL Intravenous Q12H  . tacrolimus  1 mg Oral BID  . [START ON 04/08/2014] Vancomycin  750 mg Intravenous Q M,W,F-HD    Assessment: 68yo male c/o fevers and feeling ill x1wk, could not finish HD Wed for fever, CXR shows PNA, already on IV ABX as outpt at HD, to continue while admitted.  Goal of Therapy:  Pre-HD vanc level 15-25  Plan:  Rec's vanc 750mg  after each HD though unclear whether pt rec'd at HD on Wed 2/2 shortened session, also got vanc 1g in ED; will continue with vancomycin 750mg  IV after each HD and monitor CBC, Cx, levels prn.  Wynona Neat, PharmD, BCPS  04/07/2014,1:30 AM

## 2014-04-07 NOTE — Progress Notes (Signed)
Pt transferred from the ED around 0100, admitted to Rm/3s03. Pt comes from home with spouse who is at bedside. He is alert and oriented x2-3, delayed responses. No skin breakdown noted. Placed on telemetry, currently NSR. Oriented to room, instructed to call for assistance before getting out of bed. Resting comfortably at this time, will continue to monitor

## 2014-04-08 DIAGNOSIS — T8612 Kidney transplant failure: Secondary | ICD-10-CM

## 2014-04-08 LAB — CBC
HEMATOCRIT: 24.2 % — AB (ref 39.0–52.0)
Hemoglobin: 8 g/dL — ABNORMAL LOW (ref 13.0–17.0)
MCH: 31.3 pg (ref 26.0–34.0)
MCHC: 33.1 g/dL (ref 30.0–36.0)
MCV: 94.5 fL (ref 78.0–100.0)
Platelets: 98 10*3/uL — ABNORMAL LOW (ref 150–400)
RBC: 2.56 MIL/uL — ABNORMAL LOW (ref 4.22–5.81)
RDW: 14.5 % (ref 11.5–15.5)
WBC: 3.7 10*3/uL — ABNORMAL LOW (ref 4.0–10.5)

## 2014-04-08 LAB — RPR

## 2014-04-08 LAB — GLUCOSE, CAPILLARY
GLUCOSE-CAPILLARY: 137 mg/dL — AB (ref 70–99)
GLUCOSE-CAPILLARY: 170 mg/dL — AB (ref 70–99)
Glucose-Capillary: 116 mg/dL — ABNORMAL HIGH (ref 70–99)
Glucose-Capillary: 157 mg/dL — ABNORMAL HIGH (ref 70–99)

## 2014-04-08 LAB — BASIC METABOLIC PANEL
Anion gap: 19 — ABNORMAL HIGH (ref 5–15)
BUN: 40 mg/dL — AB (ref 6–23)
CO2: 25 mEq/L (ref 19–32)
CREATININE: 6.95 mg/dL — AB (ref 0.50–1.35)
Calcium: 8.7 mg/dL (ref 8.4–10.5)
Chloride: 87 mEq/L — ABNORMAL LOW (ref 96–112)
GFR, EST AFRICAN AMERICAN: 8 mL/min — AB (ref >90)
GFR, EST NON AFRICAN AMERICAN: 7 mL/min — AB (ref >90)
Glucose, Bld: 132 mg/dL — ABNORMAL HIGH (ref 70–99)
Potassium: 3.3 mEq/L — ABNORMAL LOW (ref 3.7–5.3)
Sodium: 131 mEq/L — ABNORMAL LOW (ref 137–147)

## 2014-04-08 LAB — CRYPTOCOCCAL ANTIGEN: Crypto Ag: NEGATIVE

## 2014-04-08 LAB — C-REACTIVE PROTEIN: CRP: 19.4 mg/dL — AB (ref <0.60)

## 2014-04-08 LAB — HEPATITIS PANEL, ACUTE
HCV Ab: NEGATIVE
HEP B C IGM: NONREACTIVE
Hep A IgM: NONREACTIVE
Hepatitis B Surface Ag: NEGATIVE

## 2014-04-08 LAB — SEDIMENTATION RATE: Sed Rate: 110 mm/hr — ABNORMAL HIGH (ref 0–16)

## 2014-04-08 LAB — LACTIC ACID, PLASMA: Lactic Acid, Venous: 1.6 mmol/L (ref 0.5–2.2)

## 2014-04-08 MED ORDER — DOXERCALCIFEROL 4 MCG/2ML IV SOLN
INTRAVENOUS | Status: AC
  Administered 2014-04-08: 2 ug via INTRAVENOUS
  Filled 2014-04-08: qty 2

## 2014-04-08 NOTE — Clinical Documentation Improvement (Addendum)
"  Sepsis" documented in hospitalist progress note 11.12.15.  Please document is the diagnosis of "Sepsis" was:   - Present on Admission (POA), including clinical indicators   - Not Present on Admission   - The diagnosis of Sepsis is not applicable to this admission   - Unable to Clinically Determine  Thank You, Erling Conte ,RN Clinical Documentation Specialist:  [facility]

## 2014-04-08 NOTE — Progress Notes (Signed)
Report called pt will be transferring to 6E04 once done lunch

## 2014-04-08 NOTE — Progress Notes (Signed)
[facility] for Infectious Disease    Subjective: Feels better   Antibiotics:  Anti-infectives    Start     Dose/Rate Route Frequency Ordered Stop   04/08/14 1200  cefTAZidime (FORTAZ) 2 g in dextrose 5 % 50 mL IVPB     2 g100 mL/hr over 30 Minutes Intravenous Every M-W-F (Hemodialysis) 04/07/14 0123     04/08/14 1200  vancomycin (VANCOCIN) IVPB 750 mg/150 ml premix     750 mg150 mL/hr over 60 Minutes Intravenous Every M-W-F (Hemodialysis) 04/07/14 0123     04/07/14 0145  vancomycin (VANCOCIN) 500 mg in sodium chloride 0.9 % 100 mL IVPB  Status:  Discontinued     500 mg100 mL/hr over 60 Minutes Intravenous  Once 04/07/14 0132 04/07/14 0133   04/07/14 0123  ceFEPIme (MAXIPIME) 1 g in dextrose 5 % 50 mL IVPB  Status:  Discontinued     1 g100 mL/hr over 30 Minutes Intravenous Every 12 hours 04/07/14 0123 04/07/14 0138   04/06/14 2015  vancomycin (VANCOCIN) IVPB 1000 mg/200 mL premix     1,000 mg200 mL/hr over 60 Minutes Intravenous  Once 04/06/14 2011 04/06/14 2204   04/06/14 2015  piperacillin-tazobactam (ZOSYN) IVPB 3.375 g     3.375 g100 mL/hr over 30 Minutes Intravenous  Once 04/06/14 2011 04/06/14 2105      Medications: Scheduled Meds: . aspirin EC  81 mg Oral Daily  . atorvastatin  40 mg Oral Daily  . azaTHIOprine  75 mg Oral q morning - 10a  . calcium acetate  667 mg Oral TID WC  . ceftAZIDime (FORTAZ) IVPB 2 gram/50 mL D5W (Pyxis)  2 g Intravenous Q M,W,F-HD  . colesevelam  3,750 mg Oral Daily  . [START ON 04/13/2014] darbepoetin (ARANESP) injection - DIALYSIS  100 mcg Intravenous Q Wed-HD  . doxercalciferol      . doxercalciferol  2 mcg Intravenous Q M,W,F-HD  . heparin  5,000 Units Subcutaneous 3 times per day  . insulin aspart  0-15 Units Subcutaneous TID WC  . insulin aspart  0-5 Units Subcutaneous QHS  . insulin glargine  20 Units Subcutaneous q morning - 10a  . multivitamin  1 tablet Oral QHS  . predniSONE  10 mg Oral Daily  . sirolimus  2 mg Oral Daily    . sodium chloride  3 mL Intravenous Q12H  . Vancomycin  750 mg Intravenous Q M,W,F-HD   Continuous Infusions:  PRN Meds:.acetaminophen, famotidine    Objective: Weight change:   Intake/Output Summary (Last 24 hours) at 04/08/14 1536 Last data filed at 04/08/14 1200  Gross per 24 hour  Intake    480 ml  Output      0 ml  Net    480 ml   Blood pressure 131/64, pulse 75, temperature 97.9 F (36.6 C), temperature source Oral, resp. rate 16, height 6' (1.829 m), weight 160 lb 7.9 oz (72.8 kg), SpO2 95 %. Temp:  [97.9 F (36.6 C)-98.1 F (36.7 C)] 97.9 F (36.6 C) (11/13 1349) Pulse Rate:  [70-77] 75 (11/13 1500) Resp:  [15-22] 16 (11/13 1358) BP: (111-139)/(50-73) 131/64 mmHg (11/13 1500) SpO2:  [92 %-95 %] 95 % (11/13 1358) Weight:  [160 lb 7.9 oz (72.8 kg)] 160 lb 7.9 oz (72.8 kg) (11/13 1349)  Physical Exam: General: Alert and awake, oriented x3, not in any acute distress. HEENT: anicteric sclera, pupils reactive to light and accommodation, EOMI, oropharynx clear and without exudate no thrush CVS regular rate, normal r, no  murmur rubs or gallops Chest: Crackles at the bases bilaterally but otherwise clearly clear Abdomen: soft nontender, nondistended, normal bowel sounds, Extremities: His AV fistula site is clean and without any evidence of purulence or erythema thrills palpable Skin: no rashes Neuro: nonfocal, strength and sensation intact  CBC:  Recent Labs Lab 04/06/14 1900 04/07/14 0300 04/08/14 0326  HGB 10.3* 8.8* 8.0*  HCT 31.4* 28.1* 24.2*  PLT 125* 109* 98*     BMET  Recent Labs  04/06/14 1900 04/07/14 0300 04/08/14 0326  NA 139  --  131*  K 3.5*  --  3.3*  CL 90*  --  87*  CO2 28  --  25  GLUCOSE 75  --  132*  BUN 16  --  40*  CREATININE 3.90* 5.03* 6.95*  CALCIUM 9.3  --  8.7     Liver Panel   Recent Labs  04/06/14 1855  PROT 7.1  ALBUMIN 2.6*  AST 47*  ALT 17  ALKPHOS 62  BILITOT 0.3  BILIDIR <0.2  IBILI NOT CALCULATED        Sedimentation Rate  Recent Labs  04/08/14 0326  ESRSEDRATE 110*   C-Reactive Protein  Recent Labs  04/08/14 0326  CRP 19.4*    Micro Results: Recent Results (from the past 720 hour(s))  Blood culture (routine x 2)     Status: None   Collection Time: 03/27/14  5:51 PM  Result Value Ref Range Status   Specimen Description BLOOD LEFT ARM  Final   Special Requests BOTTLES DRAWN AEROBIC AND ANAEROBIC 5CC  Final   Culture  Setup Time   Final    03/28/2014 02:25 Performed at [facility]   Final    NO GROWTH 5 DAYS Performed at [facility]    Report Status 04/04/2014 FINAL  Final  Blood culture (routine x 2)     Status: None   Collection Time: 03/27/14  6:20 PM  Result Value Ref Range Status   Specimen Description BLOOD LEFT ARM  Final   Special Requests BOTTLES DRAWN AEROBIC AND ANAEROBIC 10CC  Final   Culture  Setup Time   Final    03/28/2014 02:25 Performed at [facility]   Final    NO GROWTH 5 DAYS Performed at [facility]    Report Status 04/04/2014 FINAL  Final  Culture, blood (routine x 2)     Status: None   Collection Time: 03/30/14  5:00 PM  Result Value Ref Range Status   Specimen Description BLOOD LEFT HAND  Final   Special Requests BOTTLES DRAWN AEROBIC ONLY 2CC  Final   Culture  Setup Time   Final    03/30/2014 22:26 Performed at [facility]   Final    NO GROWTH 5 DAYS Performed at [facility]    Report Status 04/05/2014 FINAL  Final  Culture, blood (routine x 2)     Status: None   Collection Time: 03/30/14  5:06 PM  Result Value Ref Range Status   Specimen Description BLOOD LEFT ARM  Final   Special Requests BOTTLES DRAWN AEROBIC ONLY 3.5CC  Final   Culture  Setup Time   Final    03/30/2014 22:20 Performed at [facility]   Final    NO GROWTH 5 DAYS Performed at [facility]    Report Status 04/05/2014 FINAL  Final  Blood culture (routine x 2)     Status: None (Preliminary result)   Collection Time: 04/06/14  6:55 PM  Result Value Ref Range Status   Specimen Description BLOOD RIGHT ARM  Final   Special Requests BOTTLES DRAWN AEROBIC AND ANAEROBIC 5ML  Final   Culture  Setup Time   Final    04/07/2014 01:07 Performed at [facility]    Culture   Final           BLOOD CULTURE RECEIVED NO GROWTH TO DATE CULTURE WILL BE HELD FOR 5 DAYS BEFORE ISSUING A FINAL NEGATIVE REPORT Performed at [facility]    Report Status PENDING  Incomplete  Blood culture (routine x 2)     Status: None (Preliminary result)   Collection Time: 04/06/14  7:12 PM  Result Value Ref Range Status   Specimen Description BLOOD RIGHT ARM  Final   Special Requests BOTTLES DRAWN AEROBIC AND ANAEROBIC 5ML  Final   Culture  Setup Time   Final    04/07/2014 01:07 Performed at [facility]    Culture   Final           BLOOD CULTURE RECEIVED NO GROWTH TO DATE CULTURE WILL BE HELD FOR 5 DAYS BEFORE ISSUING A FINAL NEGATIVE REPORT Performed at [facility]    Report Status PENDING  Incomplete  MRSA PCR Screening     Status: None   Collection Time: 04/07/14  7:02 AM  Result Value Ref Range Status   MRSA by PCR NEGATIVE NEGATIVE Final    Comment:        The GeneXpert MRSA Assay (FDA approved for NASAL specimens only), is one component of a comprehensive MRSA colonization surveillance program. It is not intended to diagnose MRSA infection nor to guide or monitor treatment for MRSA infections.     Studies/Results: Dg Chest 2 View  04/06/2014   CLINICAL DATA:  Fever for 1 week.  EXAM: CHEST  2 VIEW  COMPARISON:  03/27/2014  FINDINGS: Dense airspace disease at the left base. In the setting of fever this is most consistent with pneumonia. No effusion or cavitation. There may also be mild airspace opacity at the peripheral right base. There is cardiomegaly which is stable from previous. Negative  aortic and hilar contours. Status post central venous stenting.  IMPRESSION: Left lower lobe pneumonia.   Electronically Signed   By: Jorje Guild M.D.   On: 04/06/2014 20:14   Ct Head Wo Contrast  04/06/2014   CLINICAL DATA:  Headache beginning today.  Fever.  EXAM: CT HEAD WITHOUT CONTRAST  TECHNIQUE: Contiguous axial images were obtained from the base of the skull through the vertex without intravenous contrast.  COMPARISON:  Head CT scan 07/19/2010.  FINDINGS: There is some cortical atrophy which is unchanged in appearance. No evidence of acute intracranial abnormality including infarct, hemorrhage, mass lesion, mass effect, midline shift or abnormal extra-axial fluid collection is identified. There is no hydrocephalus or pneumocephalus. The calvarium is intact. There is near complete opacification of left maxillary sinus. Ethmoid air cell disease is also seen on the left. Carotid atherosclerosis is noted.  IMPRESSION: No acute intracranial abnormality.  Near-complete opacification of the left maxillary sinus and mild left ethmoid air cell disease.   Electronically Signed   By: Inge Rise M.D.   On: 04/06/2014 21:07   Ct Chest W Contrast  04/07/2014   CLINICAL DATA:  Fever of unknown origin with fatigue for several weeks, immunosuppressed status,  personal history of diabetes mellitus, hypertension, coronary artery disease post MI and CABG, CHF, renal failure post renal transplant and transplant rejection.  EXAM: CT CHEST, ABDOMEN, AND PELVIS WITH CONTRAST  TECHNIQUE: Multidetector CT imaging of the chest, abdomen and pelvis was performed following the standard protocol during bolus administration of intravenous contrast. Sagittal and coronal MPR images reconstructed from axial data set.  CONTRAST:  40mL OMNIPAQUE IOHEXOL 300 MG/ML SOLN IV. Dilute oral contrast.  COMPARISON:  PET-CT 09/27/2010, CT abdomen and pelvis 06/01/2008, CT chest 03/24/2008  FINDINGS: CT CHEST FINDINGS  Extensive  atherosclerotic calcification of the aorta and coronary arteries with evidence of prior CABG and coronary stenting.  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  Enlargement of cardiac chambers.  No thoracic adenopathy.  Pulmonary infiltrates are identified within the lingula and BILATERAL lower lobes question pneumonia.  Remaining lungs clear.  No definite pleural effusion or pneumothorax.  Diffuse osseous demineralization.  CT ABDOMEN AND PELVIS FINDINGS  Atrophic polycystic appearing kidneys.  Intermediate attenuation cyst at inferior pole LEFT kidney image 83, 14 x 12 mm.  Several hepatic cysts up to 2.2 x 2.1 cm.  Low-attenuation lesion posterior lateral upper to mid spleen, 2.2 x 1.6 cm image 65, new.  Remainder of liver, spleen, pancreas, and adrenal glands normal.  Transplant kidney RIGHT iliac fossa without gross mass or hydronephrosis.  Scattered colonic diverticulosis without evidence of diverticulitis.  Stomach and bowel loops otherwise normal appearance.  No mass, adenopathy, free fluid or inflammatory process.  Scattered atherosclerotic calcifications.  No acute osseous findings.  IMPRESSION: Extensive atherosclerotic disease with aneurysmal dilatation of the ascending thoracic aorta 4.2 x 4.3 cm.  Post CABG and coronary artery stenting.  BILATERAL lower lobe and lingular infiltrates question pneumonia.  Cystic disease of the liver and native kidneys with unremarkable transplant kidney at RIGHT iliac fossa.  Colonic diverticulosis.  Nonspecific 2.2 x 1.6 cm diameter low-attenuation lesion within the spleen; this has a peripheral base and fills in with contrast on incomplete delayed imaging.  Differential diagnosis would include perfusion artifact, hemangioma, and mass lesion.  Remainder of exam unremarkable without identification of an acute inflammatory process.   Electronically Signed   By: Lavonia Dana M.D.   On: 04/07/2014 17:32   Ct Abdomen Pelvis W  Contrast  04/07/2014   CLINICAL DATA:  Fever of unknown origin with fatigue for several weeks, immunosuppressed status, personal history of diabetes mellitus, hypertension, coronary artery disease post MI and CABG, CHF, renal failure post renal transplant and transplant rejection.  EXAM: CT CHEST, ABDOMEN, AND PELVIS WITH CONTRAST  TECHNIQUE: Multidetector CT imaging of the chest, abdomen and pelvis was performed following the standard protocol during bolus administration of intravenous contrast. Sagittal and coronal MPR images reconstructed from axial data set.  CONTRAST:  75mL OMNIPAQUE IOHEXOL 300 MG/ML SOLN IV. Dilute oral contrast.  COMPARISON:  PET-CT 09/27/2010, CT abdomen and pelvis 06/01/2008, CT chest 03/24/2008  FINDINGS: CT CHEST FINDINGS  Extensive atherosclerotic calcification of the aorta and coronary arteries with evidence of prior CABG and coronary stenting.  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  Enlargement of cardiac chambers.  No thoracic adenopathy.  Pulmonary infiltrates are identified within the lingula and BILATERAL lower lobes question pneumonia.  Remaining lungs clear.  No definite pleural effusion or pneumothorax.  Diffuse osseous demineralization.  CT ABDOMEN AND PELVIS FINDINGS  Atrophic polycystic appearing kidneys.  Intermediate attenuation cyst at inferior pole LEFT kidney image 83,  14 x 12 mm.  Several hepatic cysts up to 2.2 x 2.1 cm.  Low-attenuation lesion posterior lateral upper to mid spleen, 2.2 x 1.6 cm image 65, new.  Remainder of liver, spleen, pancreas, and adrenal glands normal.  Transplant kidney RIGHT iliac fossa without gross mass or hydronephrosis.  Scattered colonic diverticulosis without evidence of diverticulitis.  Stomach and bowel loops otherwise normal appearance.  No mass, adenopathy, free fluid or inflammatory process.  Scattered atherosclerotic calcifications.  No acute osseous findings.  IMPRESSION: Extensive atherosclerotic  disease with aneurysmal dilatation of the ascending thoracic aorta 4.2 x 4.3 cm.  Post CABG and coronary artery stenting.  BILATERAL lower lobe and lingular infiltrates question pneumonia.  Cystic disease of the liver and native kidneys with unremarkable transplant kidney at RIGHT iliac fossa.  Colonic diverticulosis.  Nonspecific 2.2 x 1.6 cm diameter low-attenuation lesion within the spleen; this has a peripheral base and fills in with contrast on incomplete delayed imaging.  Differential diagnosis would include perfusion artifact, hemangioma, and mass lesion.  Remainder of exam unremarkable without identification of an acute inflammatory process.   Electronically Signed   By: Lavonia Dana M.D.   On: 04/07/2014 17:32      Assessment/Plan:  Principal Problem:   Fever, unknown origin Active Problems:   Heart transplanted   Dyslipidemia   DM type 2 causing complication   CKD (chronic kidney disease), stage V   ESRD on dialysis   Fever of undetermined origin   Immunosuppressed status   Kidney transplant failure   Floaters in visual field    Brandon Sharp is a 68 y.o. male history of heart transplantation in 2001 kidney transplant in 2008 that failed currently on hemodialysis Monday Wednesday Friday with continued immunosuppressive therapy with sirolimus, azathioprine, and prednisone who returns to the hospital with fever and malaise and similar symptoms once he presented on November 1 despite being treated with fairly broad-spectrum healthcare associated pneumonia antibiotics in the form of vancomycin and ceftazidim which is currently still on. CT chest abdomen and pelvis failed to show any new clear-cut pathology would indicate a different infection than the one he was already being treated for. HIV, Hep Panel negative, CMV VL in blood pending.  #1 Fever of unknown origin in immunocompromised patient:  CT scan showed evidence of pneumonia in the lower lobes and lingula which I suspect was  the pneumonia that we are already treating and not representative of a new infection  There is a mass in the spleen but its significance is unclear and it does not seem to be an infectious source  Overnight he has continued to do well and is afebrile and feeling better  I wonder if a component of his feeling bad is due to his adrenal insufficiency. He apparently does not know to increase his corticosteroid dose when he is suffering from infection or other stressor  I will stop his vancomycin and Ceftazidime after todays doses (which will last through next HD)  I would observe him OFF antibiotics and follow him very closely clinicallly  Follow-up CMV PCR blood,    #2 Floaters in visual fields:  ---Given the fact that he is severely immunocompromised and that no one has examined his eyes I would recommend a formal ophthalmologic evaluation to ensure he does not have CMV retinitis for example. Since he seems stable could do this as outpatient  I will sign off for now please call us back if he becomes febrile again or there are other  questions.    LOS: 2 days   Alcide Evener 04/08/2014, 3:36 PM

## 2014-04-08 NOTE — Procedures (Signed)
Pt seen on HD.  Ap 90 Vp 150  BFR 400.  SBP 135.  Tolerating HD well so far.

## 2014-04-08 NOTE — Progress Notes (Signed)
Called wife to let her know of new room number [id number] and that pt going to dialysis at this time

## 2014-04-08 NOTE — Progress Notes (Signed)
Admission note:  Arrival Method: Patient transferred from 3S and came to unit from dialysis. Mental Orientation:  Alert and oriented x 4. Telemetry: Not on Telemetry. Assessment: See doc flow sheets. Skin: Warm, dry and intact. IV:  IV left Upper arm, saline locked. Pain: Denies any pain. Fall Prevention Safety Plan: Patient educated on fall prevention safety plan, understood and acknowledged. Admission Screening: Completed. 6700 Orientation: Patient has been oriented to the unit, staff and to the room.

## 2014-04-08 NOTE — Plan of Care (Signed)
Problem: Phase I Progression Outcomes Goal: Consider Infectious Disease Consult Outcome: Completed/Met Date Met:  04/08/14 Goal: Voiding-avoid urinary catheter unless indicated Outcome: Completed/Met Date Met:  04/08/14  Problem: Phase II Progression Outcomes Goal: Encourage coughing & deep breathing Outcome: Progressing Goal: Wean O2 if indicated Outcome: Completed/Met Date Met:  04/08/14 Goal: Pain controlled Outcome: Completed/Met Date Met:  04/08/14 Goal: Progress activity as tolerated unless otherwise ordered Outcome: Progressing Goal: Tolerating diet Outcome: Completed/Met Date Met:  04/08/14  Problem: Phase III Progression Outcomes Goal: O2 sats > or equal to 93% on room air Outcome: Completed/Met Date Met:  04/08/14 Goal: Pain controlled on oral analgesia Outcome: Completed/Met Date Met:  04/08/14 Goal: Activity at appropriate level-compared to baseline (UP IN CHAIR FOR HEMODIALYSIS)  Outcome: Progressing Goal: Tolerating diet Outcome: Completed/Met Date Met:  04/08/14

## 2014-04-08 NOTE — Progress Notes (Signed)
PROGRESS NOTE  Brandon Sharp IHK:[id number] DOB: [birth year] DOA: 04/06/2014 PCP: [name], MD  Brandon Sharp is an 68 y.o. 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 Wife stated that he has had low grade fever since a month ago, Tx with Z pak, then IV van/Zosyn in the hospital. He has little coughs, no HA or diarrhea, no distant travel and no ill contact. His most complaint was that of generalized fatigue and lost of appetite. Evalaution in the ER showed no leukocytosis, but he had fever of 101. And CXR showed LLL persistent infiltrate. BC was obtained and he was given again Van/Zosyn, and hospitalist was asked to admit him for HCAP, again. His Cr was 3.9 and his K was 3.5   Assessment/Plan: Fever- ? Source- recent PNA and given abx at d/c 1 week ago: ID consult, blood cultures pending -much improved yest/today from admission Chest CT shows b/l pna - had eye exam in June- goes to [facility] on [address]  ESRD: nephrology consult M/W/F   Sepsis- monitor cultures -lactic acid resolved -central line placed in ER  s/p heart transplant-  - immunosupressants  DM- SSI      Code Status: full Family Communication: patient Disposition Plan: tx to med surge floor   Consultants:  Renal  ID  Procedures:     HPI/Subjective: Up brushing teeth No SOB, no CP Thinks his illness was related to his blood sugars   Objective: Filed Vitals:   04/08/14 0700  BP: 114/53  Pulse:   Temp:   Resp: 15    Intake/Output Summary (Last 24 hours) at 04/08/14 0817 Last data filed at  04/07/14 1530  Gross per 24 hour  Intake   1200 ml  Output      0 ml  Net   1200 ml   Filed Weights   04/07/14 0101  Weight: 69.1 kg (152 lb 5.4 oz)    Exam:   General:  A+Ox3, NAD  Cardiovascular: rrr  Respiratory: clear, mild decrease at bases  Abdomen: +BS, soft  Musculoskeletal: no edema  Data Reviewed: Basic Metabolic Panel:  Recent Labs Lab 04/06/14 1900 04/07/14 0300 04/08/14 0326  NA 139  --  131*  K 3.5*  --  3.3*  CL 90*  --  87*  CO2 28  --  25  GLUCOSE 75  --  132*  BUN 16  --  40*  CREATININE 3.90* 5.03* 6.95*  CALCIUM 9.3  --  8.7   Liver Function Tests:  Recent Labs Lab 04/06/14 1855  AST 47*  ALT 17  ALKPHOS 62  BILITOT 0.3  PROT 7.1  ALBUMIN 2.6*   No results for input(s): LIPASE, AMYLASE in the last 168 hours. No results for input(s): AMMONIA in the last 168 hours. CBC:  Recent Labs Lab 04/06/14 1900 04/07/14 0300 04/08/14 0326  WBC 5.2 4.9 3.7*  HGB 10.3* 8.8* 8.0*  HCT 31.4* 28.1* 24.2*  MCV 97.5 99.3 94.5  PLT 125* 109* 98*   Cardiac Enzymes:  Recent Labs Lab 04/06/14 1855  TROPONINI <0.30   BNP (last 3 results)  Recent Labs  03/27/14  [place] [address].0*   CBG:  Recent Labs Lab 04/07/14 0752 04/07/14 1159 04/07/14 1559 04/07/14 2204 04/08/14 0806  GLUCAP 351* 322* 199* 182* 116*    Recent Results (from the past 240 hour(s))  Culture, blood (routine x 2)     Status: None   Collection Time: 03/30/14  5:00 PM  Result Value Ref Range Status   Specimen Description BLOOD LEFT HAND  Final   Special Requests BOTTLES DRAWN AEROBIC ONLY 2CC  Final   Culture  Setup Time   Final    03/30/2014 22:26 Performed at [facility]    Culture   Final    NO GROWTH 5 DAYS Performed at [facility]    Report Status 04/05/2014 FINAL  Final  Culture, blood (routine x 2)     Status: None   Collection Time: 03/30/14  5:06 PM  Result Value Ref Range Status   Specimen Description BLOOD LEFT ARM   Final   Special Requests BOTTLES DRAWN AEROBIC ONLY 3.5CC  Final   Culture  Setup Time   Final    03/30/2014 22:20 Performed at [facility]    Culture   Final    NO GROWTH 5 DAYS Performed at [facility]    Report Status 04/05/2014 FINAL  Final  Blood culture (routine x 2)     Status: None (Preliminary result)   Collection Time: 04/06/14  6:55 PM  Result Value Ref Range Status   Specimen Description BLOOD RIGHT ARM  Final   Special Requests BOTTLES DRAWN AEROBIC AND ANAEROBIC 5ML  Final   Culture  Setup Time   Final    04/07/2014 01:07 Performed at [facility]    Culture   Final           BLOOD CULTURE RECEIVED NO GROWTH TO DATE CULTURE WILL BE HELD FOR 5 DAYS BEFORE ISSUING A FINAL NEGATIVE REPORT Performed at [facility]    Report Status PENDING  Incomplete  Blood culture (routine x 2)     Status: None (Preliminary result)   Collection Time: 04/06/14  7:12 PM  Result Value Ref Range Status   Specimen Description BLOOD RIGHT ARM  Final   Special Requests BOTTLES DRAWN AEROBIC AND ANAEROBIC 5ML  Final   Culture  Setup Time   Final    04/07/2014 01:07 Performed at [facility]    Culture   Final           BLOOD CULTURE RECEIVED NO GROWTH TO DATE CULTURE WILL BE HELD FOR 5 DAYS BEFORE ISSUING A FINAL NEGATIVE REPORT Performed at [facility]    Report Status PENDING  Incomplete  MRSA PCR Screening     Status: None   Collection Time: 04/07/14  7:02 AM  Result Value Ref Range Status   MRSA by PCR NEGATIVE NEGATIVE Final    Comment:        The GeneXpert MRSA Assay (FDA approved for NASAL specimens only), is one component of a comprehensive MRSA colonization surveillance program. It is not intended to diagnose MRSA infection nor to guide or monitor treatment for MRSA infections.      Studies: Dg Chest 2 View  04/06/2014   CLINICAL DATA:  Fever for 1 week.  EXAM: CHEST  2 VIEW  COMPARISON:  03/27/2014  FINDINGS:  Dense airspace disease at the left base. In the setting of fever this is most consistent with pneumonia. No effusion or cavitation. There may also  be mild airspace opacity at the peripheral right base. There is cardiomegaly which is stable from previous. Negative aortic and hilar contours. Status post central venous stenting.  IMPRESSION: Left lower lobe pneumonia.   Electronically Signed   By: Jorje Guild M.D.   On: 04/06/2014 20:14   Ct Head Wo Contrast  04/06/2014   CLINICAL DATA:  Headache beginning today.  Fever.  EXAM: CT HEAD WITHOUT CONTRAST  TECHNIQUE: Contiguous axial images were obtained from the base of the skull through the vertex without intravenous contrast.  COMPARISON:  Head CT scan 07/19/2010.  FINDINGS: There is some cortical atrophy which is unchanged in appearance. No evidence of acute intracranial abnormality including infarct, hemorrhage, mass lesion, mass effect, midline shift or abnormal extra-axial fluid collection is identified. There is no hydrocephalus or pneumocephalus. The calvarium is intact. There is near complete opacification of left maxillary sinus. Ethmoid air cell disease is also seen on the left. Carotid atherosclerosis is noted.  IMPRESSION: No acute intracranial abnormality.  Near-complete opacification of the left maxillary sinus and mild left ethmoid air cell disease.   Electronically Signed   By: Inge Rise M.D.   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  EXAM: CT CHEST, ABDOMEN, AND PELVIS WITH CONTRAST  TECHNIQUE: Multidetector CT imaging of the chest, abdomen and pelvis was performed following the standard protocol during bolus administration of intravenous contrast. Sagittal and  coronal MPR images reconstructed from axial data set.  CONTRAST:  19mL OMNIPAQUE IOHEXOL 300 MG/ML SOLN IV. Dilute oral contrast.  COMPARISON:  PET-CT 09/27/2010, CT abdomen and pelvis 06/01/2008, CT chest 03/24/2008  FINDINGS: CT CHEST FINDINGS  Extensive atherosclerotic calcification of the aorta and coronary arteries with evidence of prior CABG and coronary stenting.  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  Enlargement of cardiac chambers.  No thoracic adenopathy.  Pulmonary infiltrates are identified within the lingula and BILATERAL lower lobes question pneumonia.  Remaining lungs clear.  No definite pleural effusion or pneumothorax.  Diffuse osseous demineralization.  CT ABDOMEN AND PELVIS FINDINGS  Atrophic polycystic appearing kidneys.  Intermediate attenuation cyst at inferior pole LEFT kidney image 83, 14 x 12 mm.  Several hepatic cysts up to 2.2 x 2.1 cm.  Low-attenuation lesion posterior lateral upper to mid spleen, 2.2 x 1.6 cm image 65, new.  Remainder of liver, spleen, pancreas, and adrenal glands normal.  Transplant kidney RIGHT iliac fossa without gross mass or hydronephrosis.  Scattered colonic diverticulosis without evidence of diverticulitis.  Stomach and bowel loops otherwise normal appearance.  No mass, adenopathy, free fluid or inflammatory process.  Scattered atherosclerotic calcifications.  No acute osseous findings.  IMPRESSION: Extensive atherosclerotic disease with aneurysmal dilatation of the ascending thoracic aorta 4.2 x 4.3 cm.  Post CABG and coronary artery stenting.  BILATERAL lower lobe and lingular infiltrates question pneumonia.  Cystic disease of the liver and native kidneys with unremarkable transplant kidney at RIGHT iliac fossa.  Colonic diverticulosis.  Nonspecific 2.2 x 1.6 cm diameter low-attenuation lesion within the spleen; this has a peripheral base and fills in with contrast on incomplete delayed imaging.  Differential diagnosis would  include perfusion artifact, hemangioma, and mass lesion.  Remainder of exam unremarkable without identification of an acute inflammatory  process.   Electronically Signed   By: Lavonia Dana M.D.   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  EXAM: CT CHEST, ABDOMEN, AND PELVIS WITH CONTRAST  TECHNIQUE: Multidetector CT imaging of the chest, abdomen and pelvis was performed following the standard protocol during bolus administration of intravenous contrast. Sagittal and coronal MPR images reconstructed from axial data set.  CONTRAST:  48mL OMNIPAQUE IOHEXOL 300 MG/ML SOLN IV. Dilute oral contrast.  COMPARISON:  PET-CT 09/27/2010, CT abdomen and pelvis 06/01/2008, CT chest 03/24/2008  FINDINGS: CT CHEST FINDINGS  Extensive atherosclerotic calcification of the aorta and coronary arteries with evidence of prior CABG and coronary stenting.  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  Enlargement of cardiac chambers.  No thoracic adenopathy.  Pulmonary infiltrates are identified within the lingula and BILATERAL lower lobes question pneumonia.  Remaining lungs clear.  No definite pleural effusion or pneumothorax.  Diffuse osseous demineralization.  CT ABDOMEN AND PELVIS FINDINGS  Atrophic polycystic appearing kidneys.  Intermediate attenuation cyst at inferior pole LEFT kidney image 83, 14 x 12 mm.  Several hepatic cysts up to 2.2 x 2.1 cm.  Low-attenuation lesion posterior lateral upper to mid spleen, 2.2 x 1.6 cm image 65, new.  Remainder of liver, spleen, pancreas, and adrenal glands normal.  Transplant kidney RIGHT iliac fossa without gross mass or hydronephrosis.  Scattered colonic diverticulosis without evidence  of diverticulitis.  Stomach and bowel loops otherwise normal appearance.  No mass, adenopathy, free fluid or inflammatory process.  Scattered atherosclerotic calcifications.  No acute osseous findings.  IMPRESSION: Extensive atherosclerotic disease with aneurysmal dilatation of the ascending thoracic aorta 4.2 x 4.3 cm.  Post CABG and coronary artery stenting.  BILATERAL lower lobe and lingular infiltrates question pneumonia.  Cystic disease of the liver and native kidneys with unremarkable transplant kidney at RIGHT iliac fossa.  Colonic diverticulosis.  Nonspecific 2.2 x 1.6 cm diameter low-attenuation lesion within the spleen; this has a peripheral base and fills in with contrast on incomplete delayed imaging.  Differential diagnosis would include perfusion artifact, hemangioma, and mass lesion.  Remainder of exam unremarkable without identification of an acute inflammatory process.   Electronically Signed   By: Lavonia Dana M.D.   On: 04/07/2014 17:32    Scheduled Meds: . aspirin EC  81 mg Oral Daily  . atorvastatin  40 mg Oral Daily  . azaTHIOprine  75 mg Oral q morning - 10a  . calcium acetate  667 mg Oral TID WC  . ceftAZIDime (FORTAZ) IVPB 2 gram/50 mL D5W (Pyxis)  2 g Intravenous Q M,W,F-HD  . colesevelam  3,750 mg Oral Daily  . [START ON 04/13/2014] darbepoetin (ARANESP) injection - DIALYSIS  100 mcg Intravenous Q Wed-HD  . doxercalciferol  2 mcg Intravenous Q M,W,F-HD  . heparin  5,000 Units Subcutaneous 3 times per day  . insulin aspart  0-15 Units Subcutaneous TID WC  . insulin aspart  0-5 Units Subcutaneous QHS  . insulin glargine  20 Units Subcutaneous q morning - 10a  . multivitamin  1 tablet Oral QHS  . predniSONE  10 mg Oral Daily  . sirolimus  2 mg Oral Daily  . sodium chloride  3 mL Intravenous Q12H  . Vancomycin  750 mg Intravenous Q M,W,F-HD   Continuous Infusions:  Antibiotics Given (last 72 hours)    None      Principal Problem:   Fever, unknown origin Active  Problems:   Heart transplanted   Dyslipidemia   DM type 2 causing complication   CKD (chronic kidney disease), stage V   ESRD on dialysis   Fever of undetermined origin   Immunosuppressed status   Kidney transplant failure   Floaters in visual field    Time spent: 35 min    Said Rueb DO  [facility] Pager [phone]. If 7PM-7AM, please contact night-coverage at [email or web address], password [email or web address] 04/08/2014, 8:17 AM  LOS: 2 days

## 2014-04-08 NOTE — Plan of Care (Signed)
Problem: Phase I Progression Outcomes Goal: Initial discharge plan identified Outcome: Completed/Met Date Met:  04/08/14

## 2014-04-08 NOTE — Progress Notes (Signed)
S: Feels well.  No specific CO.  Appetite good O:BP 125/52 mmHg  Pulse 77  Temp(Src) 98 F (36.7 C) (Oral)  Resp 17  Ht 6' (1.829 m)  Wt 69.1 kg (152 lb 5.4 oz)  BMI 20.66 kg/m2  SpO2 92%  Intake/Output Summary (Last 24 hours) at 04/08/14 0744 Last data filed at 04/07/14 1530  Gross per 24 hour  Intake   1200 ml  Output      0 ml  Net   1200 ml   Weight change:  KDT:OIZTI and alert CVS:RRR Resp:decreased BS Lt base Abd:+ BS NTND Ext:no edema  Rt AVF + bruit NEURO:CNI OX3 no asterixis   . aspirin EC  81 mg Oral Daily  . atorvastatin  40 mg Oral Daily  . azaTHIOprine  75 mg Oral q morning - 10a  . calcium acetate  667 mg Oral TID WC  . ceftAZIDime (FORTAZ) IVPB 2 gram/50 mL D5W (Pyxis)  2 g Intravenous Q M,W,F-HD  . colesevelam  3,750 mg Oral Daily  . [START ON 04/13/2014] darbepoetin (ARANESP) injection - DIALYSIS  100 mcg Intravenous Q Wed-HD  . doxercalciferol  2 mcg Intravenous Q M,W,F-HD  . heparin  5,000 Units Subcutaneous 3 times per day  . insulin aspart  0-15 Units Subcutaneous TID WC  . insulin aspart  0-5 Units Subcutaneous QHS  . insulin glargine  20 Units Subcutaneous q morning - 10a  . multivitamin  1 tablet Oral QHS  . predniSONE  10 mg Oral Daily  . sirolimus  2 mg Oral Daily  . sodium chloride  3 mL Intravenous Q12H  . Vancomycin  750 mg Intravenous Q M,W,F-HD   Dg Chest 2 View  04/06/2014   CLINICAL DATA:  Fever for 1 week.  EXAM: CHEST  2 VIEW  COMPARISON:  03/27/2014  FINDINGS: Dense airspace disease at the left base. In the setting of fever this is most consistent with pneumonia. No effusion or cavitation. There may also be mild airspace opacity at the peripheral right base. There is cardiomegaly which is stable from previous. Negative aortic and hilar contours. Status post central venous stenting.  IMPRESSION: Left lower lobe pneumonia.   Electronically Signed   By: Jorje Guild M.D.   On: 04/06/2014 20:14   Ct Head Wo Contrast  04/06/2014    CLINICAL DATA:  Headache beginning today.  Fever.  EXAM: CT HEAD WITHOUT CONTRAST  TECHNIQUE: Contiguous axial images were obtained from the base of the skull through the vertex without intravenous contrast.  COMPARISON:  Head CT scan 07/19/2010.  FINDINGS: There is some cortical atrophy which is unchanged in appearance. No evidence of acute intracranial abnormality including infarct, hemorrhage, mass lesion, mass effect, midline shift or abnormal extra-axial fluid collection is identified. There is no hydrocephalus or pneumocephalus. The calvarium is intact. There is near complete opacification of left maxillary sinus. Ethmoid air cell disease is also seen on the left. Carotid atherosclerosis is noted.  IMPRESSION: No acute intracranial abnormality.  Near-complete opacification of the left maxillary sinus and mild left ethmoid air cell disease.   Electronically Signed   By: Inge Rise M.D.   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  EXAM: CT CHEST, ABDOMEN, AND PELVIS WITH CONTRAST  TECHNIQUE: Multidetector CT imaging of the chest, abdomen and pelvis was performed following the standard protocol during bolus administration of intravenous contrast. Sagittal and coronal MPR images reconstructed from axial data set.  CONTRAST:  54mL OMNIPAQUE IOHEXOL 300 MG/ML SOLN IV. Dilute oral contrast.  COMPARISON:  PET-CT 09/27/2010, CT abdomen and pelvis 06/01/2008, CT chest 03/24/2008  FINDINGS: CT CHEST FINDINGS  Extensive atherosclerotic calcification of the aorta and coronary arteries with evidence of prior CABG and coronary stenting.  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  Enlargement of cardiac chambers.  No  thoracic adenopathy.  Pulmonary infiltrates are identified within the lingula and BILATERAL lower lobes question pneumonia.  Remaining lungs clear.  No definite pleural effusion or pneumothorax.  Diffuse osseous demineralization.  CT ABDOMEN AND PELVIS FINDINGS  Atrophic polycystic appearing kidneys.  Intermediate attenuation cyst at inferior pole LEFT kidney image 83, 14 x 12 mm.  Several hepatic cysts up to 2.2 x 2.1 cm.  Low-attenuation lesion posterior lateral upper to mid spleen, 2.2 x 1.6 cm image 65, new.  Remainder of liver, spleen, pancreas, and adrenal glands normal.  Transplant kidney RIGHT iliac fossa without gross mass or hydronephrosis.  Scattered colonic diverticulosis without evidence of diverticulitis.  Stomach and bowel loops otherwise normal appearance.  No mass, adenopathy, free fluid or inflammatory process.  Scattered atherosclerotic calcifications.  No acute osseous findings.  IMPRESSION: Extensive atherosclerotic disease with aneurysmal dilatation of the ascending thoracic aorta 4.2 x 4.3 cm.  Post CABG and coronary artery stenting.  BILATERAL lower lobe and lingular infiltrates question pneumonia.  Cystic disease of the liver and native kidneys with unremarkable transplant kidney at RIGHT iliac fossa.  Colonic diverticulosis.  Nonspecific 2.2 x 1.6 cm diameter low-attenuation lesion within the spleen; this has a peripheral base and fills in with contrast on incomplete delayed imaging.  Differential diagnosis would include perfusion artifact, hemangioma, and mass lesion.  Remainder of exam unremarkable without identification of an acute inflammatory process.   Electronically Signed   By: Lavonia Dana M.D.   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  EXAM: CT CHEST, ABDOMEN, AND PELVIS WITH CONTRAST  TECHNIQUE: Multidetector CT imaging of the chest, abdomen and pelvis was performed following the standard protocol during bolus administration of intravenous contrast. Sagittal and coronal MPR images reconstructed from axial data set.  CONTRAST:  52mL OMNIPAQUE IOHEXOL 300 MG/ML SOLN IV. Dilute oral contrast.  COMPARISON:  PET-CT 09/27/2010, CT abdomen and pelvis 06/01/2008, CT chest 03/24/2008  FINDINGS: CT CHEST FINDINGS  Extensive atherosclerotic calcification of the aorta and coronary arteries with evidence of prior CABG and coronary stenting.  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  Enlargement of cardiac chambers.  No thoracic adenopathy.  Pulmonary infiltrates are identified within the lingula and BILATERAL lower lobes question pneumonia.  Remaining lungs clear.  No definite pleural effusion or pneumothorax.  Diffuse osseous demineralization.  CT ABDOMEN AND PELVIS FINDINGS  Atrophic polycystic appearing kidneys.  Intermediate attenuation cyst at inferior pole LEFT kidney image 83, 14 x 12 mm.  Several hepatic cysts up to 2.2 x 2.1 cm.  Low-attenuation lesion posterior lateral upper to mid spleen, 2.2 x 1.6 cm image 65, new.  Remainder  of liver, spleen, pancreas, and adrenal glands normal.  Transplant kidney RIGHT iliac fossa without gross mass or hydronephrosis.  Scattered colonic diverticulosis without evidence of diverticulitis.  Stomach and bowel loops otherwise normal appearance.  No mass, adenopathy, free fluid or inflammatory process.  Scattered atherosclerotic calcifications.  No acute osseous findings.  IMPRESSION: Extensive atherosclerotic disease with aneurysmal dilatation of the ascending thoracic aorta 4.2 x 4.3 cm.  Post CABG and coronary artery stenting.  BILATERAL lower lobe and lingular infiltrates question pneumonia.  Cystic disease of the liver and native kidneys with  unremarkable transplant kidney at RIGHT iliac fossa.  Colonic diverticulosis.  Nonspecific 2.2 x 1.6 cm diameter low-attenuation lesion within the spleen; this has a peripheral base and fills in with contrast on incomplete delayed imaging.  Differential diagnosis would include perfusion artifact, hemangioma, and mass lesion.  Remainder of exam unremarkable without identification of an acute inflammatory process.   Electronically Signed   By: Lavonia Dana M.D.   On: 04/07/2014 17:32   BMET    Component Value Date/Time   NA 131* 04/08/2014 0326   K 3.3* 04/08/2014 0326   CL 87* 04/08/2014 0326   CO2 25 04/08/2014 0326   GLUCOSE 132* 04/08/2014 0326   BUN 40* 04/08/2014 0326   CREATININE 6.95* 04/08/2014 0326   CALCIUM 8.7 04/08/2014 0326   GFRNONAA 7* 04/08/2014 0326   GFRAA 8* 04/08/2014 0326   CBC    Component Value Date/Time   WBC 3.7* 04/08/2014 0326   WBC 4.5 02/13/2012 1314   RBC 2.56* 04/08/2014 0326   RBC 3.21* 02/13/2012 1314   HGB 8.0* 04/08/2014 0326   HGB 11.6* 02/13/2012 1314   HCT 24.2* 04/08/2014 0326   HCT 36.1* 02/13/2012 1314   PLT 98* 04/08/2014 0326   PLT 76* 02/13/2012 1314   MCV 94.5 04/08/2014 0326   MCV 112.6* 02/13/2012 1314   MCH 31.3 04/08/2014 0326   MCH 36.3* 02/13/2012 1314   MCHC 33.1 04/08/2014 0326   MCHC 32.3 02/13/2012 1314   RDW 14.5 04/08/2014 0326   RDW 15.7* 02/13/2012 1314   LYMPHSABS 1.4 03/27/2014 1701   LYMPHSABS 0.8* 02/13/2012 1314   MONOABS 0.5 03/27/2014 1701   MONOABS 0.6 02/13/2012 1314   EOSABS 0.0 03/27/2014 1701   EOSABS 0.1 02/13/2012 1314   BASOSABS 0.0 03/27/2014 1701   BASOSABS 0.0 02/13/2012 1314     Assessment: 1. Fever with lower lobe infiltrates, currently afebrile 2. Anemia on aranesp 3. Sec HPTH on hectorol 4. DM 5. Cardiac transplant  Plan: 1. HD today 2.  Presently he looks too good to have an untreated opportunistic infection.  Fever seem to have    Brandon Sharp T

## 2014-04-09 DIAGNOSIS — D899 Disorder involving the immune mechanism, unspecified: Secondary | ICD-10-CM

## 2014-04-09 LAB — GLUCOSE, CAPILLARY
GLUCOSE-CAPILLARY: 103 mg/dL — AB (ref 70–99)
GLUCOSE-CAPILLARY: 190 mg/dL — AB (ref 70–99)
Glucose-Capillary: 176 mg/dL — ABNORMAL HIGH (ref 70–99)
Glucose-Capillary: 214 mg/dL — ABNORMAL HIGH (ref 70–99)

## 2014-04-09 NOTE — Progress Notes (Addendum)
PROGRESS NOTE  Brandon Sharp IRW:[id number] DOB: 26-Jun-1945 DOA: 04/06/2014 PCP: [name], MD  Brandon Sharp is an 68 y.o. 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 Wife stated that he has had low grade fever since a month ago, Tx with Z pak, then IV van/Zosyn in the hospital. He has little coughs, no HA or diarrhea, no distant travel and no ill contact. His most complaint was that of generalized fatigue and lost of appetite. Evalaution in the ER showed no leukocytosis, but he had fever of 101. And CXR showed LLL persistent infiltrate. BC was obtained and he was given again Van/Zosyn, and hospitalist was asked to admit him for HCAP  Seen by ID, who thinks a component of his feeling bad is due to his adrenal insufficiency. He apparently does not know to increase his corticosteroid dose when he is suffering from infection or other stressor.     Assessment/Plan: Fever (resolved)- ? Source- recent PNA and given abx at d/c 1 week ago: ID consult, blood cultures NGTD -much improved from admission Chest CT shows b/l pna - had eye exam in June- goes to [facility] on [address]- will need to be evaluated there again for possible CMV retinitis --- ABX d/c'd 11/13-- will watch for 24-48 hours off for fever re-occurrence -patient anxious to go home  ESRD: nephrology consult M/W/F   Sepsis- monitor cultures -lactic acid resolved -central line placed in ER  s/p heart transplant-  - immunosupressants  DM- SSI      Code Status: full Family Communication: patient Disposition Plan: if afebrile,  suspect d/c in AM   Consultants:  Renal  ID  Procedures:     HPI/Subjective: Anxious to go home No CP, no SOB   Objective: Filed Vitals:   04/08/14 2046  BP: 139/71  Pulse: 78  Temp: 98.5 F (36.9 C)  Resp: 16    Intake/Output Summary (Last 24 hours) at 04/09/14 0910 Last data filed at 04/09/14 0542  Gross per 24 hour  Intake    480 ml  Output   1575 ml  Net  -1095 ml   Filed Weights   04/08/14 1349 04/08/14 1750 04/08/14 2046  Weight: 72.8 kg (160 lb 7.9 oz) 71.5 kg (157 lb 10.1 oz) 72.349 kg (159 lb 8 oz)    Exam:   General:  A+Ox3, NAD  Cardiovascular: rrr  Respiratory: clear, mild decrease at bases  Abdomen: +BS, soft  Musculoskeletal: no edema  Data Reviewed: Basic Metabolic Panel:  Recent Labs Lab 04/06/14 1900 04/07/14 0300 04/08/14 0326  NA 139  --  131*  K 3.5*  --  3.3*  CL 90*  --  87*  CO2 28  --  25  GLUCOSE 75  --  132*  BUN 16  --  40*  CREATININE 3.90* 5.03* 6.95*  CALCIUM 9.3  --  8.7   Liver Function Tests:  Recent Labs Lab 04/06/14 1855  AST 47*  ALT 17  ALKPHOS 62  BILITOT 0.3  PROT 7.1  ALBUMIN 2.6*   No results for input(s): LIPASE, AMYLASE in the last 168 hours. No results for  input(s): AMMONIA in the last 168 hours. CBC:  Recent Labs Lab 04/06/14 1900 04/07/14 0300 04/08/14 0326  WBC 5.2 4.9 3.7*  HGB 10.3* 8.8* 8.0*  HCT 31.4* 28.1* 24.2*  MCV 97.5 99.3 94.5  PLT 125* 109* 98*   Cardiac Enzymes:  Recent Labs Lab 04/06/14 1855  TROPONINI <0.30   BNP (last 3 results)  Recent Labs  03/27/14 1747  PROBNP 37853.0*   CBG:  Recent Labs Lab 04/08/14 0806 04/08/14 1156 04/08/14 1839 04/08/14 2046 04/09/14 0755  GLUCAP 116* 157* 137* 170* 103*    Recent Results (from the past 240 hour(s))  Culture, blood (routine x 2)     Status: None   Collection Time: 03/30/14  5:00 PM  Result Value Ref Range Status   Specimen Description BLOOD LEFT HAND  Final   Special Requests BOTTLES  DRAWN AEROBIC ONLY 2CC  Final   Culture  Setup Time   Final    03/30/2014 22:26 Performed at [facility]   Final    NO GROWTH 5 DAYS Performed at [facility]    Report Status 04/05/2014 FINAL  Final  Culture, blood (routine x 2)     Status: None   Collection Time: 03/30/14  5:06 PM  Result Value Ref Range Status   Specimen Description BLOOD LEFT ARM  Final   Special Requests BOTTLES DRAWN AEROBIC ONLY 3.5CC  Final   Culture  Setup Time   Final    03/30/2014 22:20 Performed at [facility]    Culture   Final    NO GROWTH 5 DAYS Performed at [facility]    Report Status 04/05/2014 FINAL  Final  Blood culture (routine x 2)     Status: None (Preliminary result)   Collection Time: 04/06/14  6:55 PM  Result Value Ref Range Status   Specimen Description BLOOD RIGHT ARM  Final   Special Requests BOTTLES DRAWN AEROBIC AND ANAEROBIC 5ML  Final   Culture  Setup Time   Final    04/07/2014 01:07 Performed at [facility]    Culture   Final           BLOOD CULTURE RECEIVED NO GROWTH TO DATE CULTURE WILL BE HELD FOR 5 DAYS BEFORE ISSUING A FINAL NEGATIVE REPORT Performed at [facility]    Report Status PENDING  Incomplete  Blood culture (routine x 2)     Status: None (Preliminary result)   Collection Time: 04/06/14  7:12 PM  Result Value Ref Range Status   Specimen Description BLOOD RIGHT ARM  Final   Special Requests BOTTLES DRAWN AEROBIC AND ANAEROBIC 5ML  Final   Culture  Setup Time   Final    04/07/2014 01:07 Performed at [facility]    Culture   Final           BLOOD CULTURE RECEIVED NO GROWTH TO DATE CULTURE WILL BE HELD FOR 5 DAYS BEFORE ISSUING A FINAL NEGATIVE REPORT Performed at [facility]    Report Status PENDING  Incomplete  MRSA PCR Screening     Status: None   Collection Time: 04/07/14  7:02 AM  Result Value Ref Range Status   MRSA by PCR NEGATIVE NEGATIVE Final    Comment:         The GeneXpert MRSA Assay (FDA approved for NASAL specimens only), is one component of a comprehensive MRSA colonization surveillance program. It is not intended to diagnose MRSA infection  nor to guide or monitor treatment for MRSA infections.      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  EXAM: CT CHEST, ABDOMEN, AND PELVIS WITH CONTRAST  TECHNIQUE: Multidetector CT imaging of the chest, abdomen and pelvis was performed following the standard protocol during bolus administration of intravenous contrast. Sagittal and coronal MPR images reconstructed from axial data set.  CONTRAST:  61mL OMNIPAQUE IOHEXOL 300 MG/ML SOLN IV. Dilute oral contrast.  COMPARISON:  PET-CT 09/27/2010, CT abdomen and pelvis 06/01/2008, CT chest 03/24/2008  FINDINGS: CT CHEST FINDINGS  Extensive atherosclerotic calcification of the aorta and coronary arteries with evidence of prior CABG and coronary stenting.  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  Enlargement of cardiac chambers.  No thoracic adenopathy.  Pulmonary infiltrates are identified within the lingula and BILATERAL lower lobes question pneumonia.  Remaining lungs clear.  No definite pleural effusion or pneumothorax.  Diffuse osseous demineralization.  CT ABDOMEN AND PELVIS FINDINGS  Atrophic polycystic appearing kidneys.  Intermediate attenuation cyst at inferior pole LEFT kidney image 83, 14 x 12 mm.  Several hepatic cysts up to 2.2 x 2.1 cm.  Low-attenuation lesion posterior lateral upper to mid spleen, 2.2 x 1.6 cm image 65, new.  Remainder of liver, spleen, pancreas, and adrenal glands normal.  Transplant kidney RIGHT iliac fossa without gross mass or hydronephrosis.   Scattered colonic diverticulosis without evidence of diverticulitis.  Stomach and bowel loops otherwise normal appearance.  No mass, adenopathy, free fluid or inflammatory process.  Scattered atherosclerotic calcifications.  No acute osseous findings.  IMPRESSION: Extensive atherosclerotic disease with aneurysmal dilatation of the ascending thoracic aorta 4.2 x 4.3 cm.  Post CABG and coronary artery stenting.  BILATERAL lower lobe and lingular infiltrates question pneumonia.  Cystic disease of the liver and native kidneys with unremarkable transplant kidney at RIGHT iliac fossa.  Colonic diverticulosis.  Nonspecific 2.2 x 1.6 cm diameter low-attenuation lesion within the spleen; this has a peripheral base and fills in with contrast on incomplete delayed imaging.  Differential diagnosis would include perfusion artifact, hemangioma, and mass lesion.  Remainder of exam unremarkable without identification of an acute inflammatory process.   Electronically Signed   By: Lavonia Dana M.D.   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  EXAM: CT CHEST, ABDOMEN, AND PELVIS WITH CONTRAST  TECHNIQUE: Multidetector CT imaging of the chest, abdomen and pelvis was performed following the standard protocol during bolus administration of intravenous contrast. Sagittal and coronal MPR images reconstructed from axial data set.  CONTRAST:  27mL OMNIPAQUE IOHEXOL 300 MG/ML SOLN IV. Dilute oral contrast.  COMPARISON:  PET-CT 09/27/2010, CT abdomen and pelvis 06/01/2008, CT chest 03/24/2008  FINDINGS: CT CHEST FINDINGS  Extensive atherosclerotic calcification of the aorta and coronary arteries with evidence of prior CABG and coronary stenting.  Aneurysmal  dilatation ascending thoracic aorta 4.2 x 4.3 cm image 32.  Enlargement of cardiac chambers.  No thoracic adenopathy.  Pulmonary infiltrates are identified within the lingula and BILATERAL lower lobes question pneumonia.  Remaining lungs clear.  No definite pleural effusion or pneumothorax.  Diffuse osseous demineralization.  CT ABDOMEN AND PELVIS FINDINGS  Atrophic polycystic appearing kidneys.  Intermediate attenuation cyst at inferior pole LEFT kidney image 83, 14 x 12 mm.  Several hepatic cysts up to 2.2 x 2.1 cm.  Low-attenuation lesion posterior lateral upper to mid spleen, 2.2 x 1.6 cm image 65, new.  Remainder of liver, spleen, pancreas, and adrenal glands normal.  Transplant kidney RIGHT iliac fossa without gross mass or hydronephrosis.  Scattered colonic diverticulosis without evidence of diverticulitis.  Stomach and bowel loops otherwise normal appearance.  No mass, adenopathy, free fluid or inflammatory process.  Scattered atherosclerotic calcifications.  No acute osseous findings.  IMPRESSION: Extensive atherosclerotic disease with aneurysmal dilatation of the ascending thoracic aorta 4.2 x 4.3 cm.  Post CABG and coronary artery stenting.  BILATERAL lower lobe and lingular infiltrates question pneumonia.  Cystic disease of the liver and native kidneys with unremarkable transplant kidney at RIGHT iliac fossa.  Colonic diverticulosis.  Nonspecific 2.2 x 1.6 cm diameter low-attenuation lesion within the spleen; this has a peripheral base and fills in with contrast on incomplete delayed imaging.  Differential diagnosis would include perfusion artifact, hemangioma, and mass lesion.  Remainder of exam unremarkable without identification of an acute inflammatory process.   Electronically Signed   By: Lavonia Dana M.D.   On: 04/07/2014 17:32    Scheduled Meds: . aspirin EC  81 mg Oral Daily  . atorvastatin  40 mg Oral Daily  . azaTHIOprine  75 mg Oral q morning - 10a  . calcium acetate  667 mg Oral TID WC   . colesevelam  3,750 mg Oral Daily  . [START ON 04/13/2014] darbepoetin (ARANESP) injection - DIALYSIS  100 mcg Intravenous Q Wed-HD  . doxercalciferol  2 mcg Intravenous Q M,W,F-HD  . heparin  5,000 Units Subcutaneous 3 times per day  . insulin aspart  0-15 Units Subcutaneous TID WC  . insulin aspart  0-5 Units Subcutaneous QHS  . insulin glargine  20 Units Subcutaneous q morning - 10a  . multivitamin  1 tablet Oral QHS  . predniSONE  10 mg Oral Daily  . sirolimus  2 mg Oral Daily  . sodium chloride  3 mL Intravenous Q12H   Continuous Infusions:  Antibiotics Given (last 72 hours)    Date/Time Action Medication Dose Rate   04/08/14 1633 Given   vancomycin (VANCOCIN) IVPB 750 mg/150 ml premix 750 mg 150 mL/hr   04/08/14 1739 Given   cefTAZidime (FORTAZ) 2 g in dextrose 5 % 50 mL IVPB 2 g 100 mL/hr      Principal Problem:   Fever, unknown origin Active Problems:   Heart transplanted   Dyslipidemia   DM type 2 causing complication   CKD (chronic kidney disease), stage V   ESRD on dialysis   Fever of undetermined origin   Immunosuppressed status   Kidney transplant failure   Floaters in visual field    Time spent: 25 min    Jazzelle Zhang DO  [facility] Pager [phone]. If 7PM-7AM, please contact night-coverage at [email or web address], password [facility] 04/09/2014, 9:10 AM  LOS: 3 days

## 2014-04-09 NOTE — Progress Notes (Signed)
Subjective:  No complaints, feeling much better, no dyspnea, no cough  Objective: Vital signs in last 24 hours: Temp:  [97.9 F (36.6 C)-98.5 F (36.9 C)] 98.5 F (36.9 C) (11/13 2046) Pulse Rate:  [70-78] 78 (11/13 2046) Resp:  [16-17] 16 (11/13 2046) BP: (108-139)/(60-73) 139/71 mmHg (11/13 2046) SpO2:  [94 %-98 %] 98 % (11/13 2046) Weight:  [71.5 kg (157 lb 10.1 oz)-72.8 kg (160 lb 7.9 oz)] 72.349 kg (159 lb 8 oz) (11/13 2046) Weight change:   Intake/Output from previous day: 11/13 0701 - 11/14 0700 In: 480 [P.O.:480] Out: 1575  Intake/Output this shift:   EXAM: General appearance: Alert, in no apparent distress Resp:  Decreased BS at bases; otherwise, clear Cardio:  RRR without murmur or rub GI:  + BS, soft and nontender Extremities:  No edema Access:  AVF @ LUA with + bruit  Lab Results:  Recent Labs  04/07/14 0300 04/08/14 0326  WBC 4.9 3.7*  HGB 8.8* 8.0*  HCT 28.1* 24.2*  PLT 109* 98*   BMET:  Recent Labs  04/06/14 1855  04/06/14 1900 04/07/14 0300 04/08/14 0326  NA  --   --  139  --  131*  K  --   --  3.5*  --  3.3*  CL  --   --  90*  --  87*  CO2  --   --  28  --  25  GLUCOSE  --   --  75  --  132*  BUN  --   --  16  --  40*  CREATININE  --   < > 3.90* 5.03* 6.95*  CALCIUM  --   --  9.3  --  8.7  ALBUMIN 2.6*  --   --   --   --   < > = values in this interval not displayed. No results for input(s): PTH in the last 72 hours. Iron Studies: No results for input(s): IRON, TIBC, TRANSFERRIN, FERRITIN in the last 72 hours.  Studies/Results: No results found.   Dialysis Orders: Center: [facility] on MWF . EDW 71.5 kg HD Bath 2.0 k, 2.25 ca Time 4hr Heparin 3000. Access RUA AVF BFR 400 DFR 800  Hectorol 2 mcg IV/HD Aranesp 50mcg Units IV/HD Venofer 0   Assessment/Plan: 1. Fever - likely PNA with B LL infiltrates per CXR, s/p antibiotics. 2. ESRD - HD on MWF @ [facility], K 3.3.  Next HD 11/16. 3. HTN/Volume - BP 139/71, no meds; wt 72.4 kg s/p net  UF 1.6 L yesterday. 4. Anemia - Hgb down to 8, Aranesp 100 mcg on Wed. 5. Sec HPT - Ca 8.7; Hectorol 2 mcg, Phoslo with meals. 6. Nutrition - carb-mod renal diet, vitamin. 7. Hx cardiac transplant - in 2001, Sirolimus & Prednisone. 8. IDDM - insulin per primary.    LOS: 3 days   LYLES,CHARLES 04/09/2014,7:14 AM  I have seen and examined this patient and agree with plan per Ramiro Harvest.  Feels well, Afebrile.  Off AB.  He would like to go in Am if no further fevers. Kierrah Kilbride T,MD 04/09/2014 8:47 AM

## 2014-04-10 LAB — CBC
HEMATOCRIT: 27.7 % — AB (ref 39.0–52.0)
Hemoglobin: 8.9 g/dL — ABNORMAL LOW (ref 13.0–17.0)
MCH: 31.9 pg (ref 26.0–34.0)
MCHC: 32.1 g/dL (ref 30.0–36.0)
MCV: 99.3 fL (ref 78.0–100.0)
PLATELETS: 138 10*3/uL — AB (ref 150–400)
RBC: 2.79 MIL/uL — ABNORMAL LOW (ref 4.22–5.81)
RDW: 14.5 % (ref 11.5–15.5)
WBC: 3.4 10*3/uL — ABNORMAL LOW (ref 4.0–10.5)

## 2014-04-10 LAB — GLUCOSE, CAPILLARY: Glucose-Capillary: 104 mg/dL — ABNORMAL HIGH (ref 70–99)

## 2014-04-10 NOTE — Progress Notes (Signed)
Discharge instructions and medications discussed with patient.  All questions answered.  

## 2014-04-10 NOTE — Progress Notes (Signed)
Subjective:  No complaints, hoping for discharge today.  Objective: Vital signs in last 24 hours: Temp:  [97.9 F (36.6 C)-98.8 F (37.1 C)] 98 F (36.7 C) (11/15 0516) Pulse Rate:  [75-79] 77 (11/15 0516) Resp:  [16-18] 18 (11/15 0516) BP: (118-163)/(55-65) 163/65 mmHg (11/15 0516) SpO2:  [96 %-98 %] 97 % (11/15 0516) Weight:  [72.984 kg (160 lb 14.4 oz)] 72.984 kg (160 lb 14.4 oz) (11/14 2134) Weight change: 0.184 kg (6.5 oz)  Intake/Output from previous day: 11/14 0701 - 11/15 0700 In: 920 [P.O.:920] Out: -  Intake/Output this shift:   Lab Results:  Recent Labs  04/08/14 0326  WBC 3.7*  HGB 8.0*  HCT 24.2*  PLT 98*   BMET:  Recent Labs  04/08/14 0326  NA 131*  K 3.3*  CL 87*  CO2 25  GLUCOSE 132*  BUN 40*  CREATININE 6.95*  CALCIUM 8.7   No results for input(s): PTH in the last 72 hours. Iron Studies: No results for input(s): IRON, TIBC, TRANSFERRIN, FERRITIN in the last 72 hours.  Studies/Results: No results found.   EXAM: General appearance: Alert, in no apparent distress Resp: Decreased BS at bases; otherwise, clear Cardio: RRR without murmur or rub GI: + BS, soft and nontender Extremities: No edema Access: AVF @ LUA with + bruit  Dialysis Orders: Center: [facility] on MWF . EDW 71.5 kg HD Bath 2.0 k, 2.25 ca Time 4hr Heparin 3000. Access RUA AVF BFR 400 DFR 800  Hectorol 2 mcg IV/HD Aranesp 77mcg Units IV/HD Venofer 0   Assessment/Plan: 1. Fever - likely PNA with B LL infiltrates per CXR, s/p antibiotics. 2. ESRD - HD on MWF @ [facility], K 3.3. Next HD tomorrow. 3. HTN/Volume - BP 139/71, no meds; wt 73 kg with no signs or symptoms of fluid overload.. 4. Anemia - Hgb down to 8, Aranesp 100 mcg on Wed. 5. Sec HPT - Ca 8.7; Hectorol 2 mcg, Phoslo with meals. 6. Nutrition - carb-mod renal diet, vitamin. 7. Hx cardiac transplant - in 2001, Sirolimus & Prednisone. 8. IDDM - insulin per primary.   LOS: 4 days    LYLES,CHARLES 04/10/2014,7:49 AM I have seen and examined this patient and agree with plan per Ramiro Harvest.   Remains afebrile.  Hopefully home today. Latiffany Harwick T,M 04/10/2014 8:51 AM

## 2014-04-10 NOTE — Discharge Summary (Addendum)
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 Wife stated that he has had low grade fever since a month ago, Tx with Z pak, then IV van/Zosyn in the hospital. He has little coughs, no HA or diarrhea, no distant travel and no ill contact. His most complaint was that of generalized fatigue and lost of appetite. Evalaution in the ER showed no leukocytosis, but he had fever of 101. And CXR showed LLL persistent infiltrate. BC was obtained and he was given again Van/Zosyn, and hospitalist was asked to admit him for HCAP, again. His Cr was 3.9 and his K was 3.5.   Hospital Course:  Empiric IV antibiotics continued. ID and nephrology consulted. Persistent infiltrate not felt to be new pneumonia. Cultures, UA, HIV, EBV, cryptococcal ag, acute hepatitis panel negative.  CT  abd pelvis unrevealing. Antibiotics stopped and prednisone transiently increased to 10 mg daily. No further fevers, so stable for discharge. Malaise may have been related to relative adrenal insufficiency.  Procedures: none  Consultations:  ID   nephrology  Discharge Exam: Filed Vitals:   04/10/14 0516  BP: 163/65  Pulse: 77  Temp: 98 F (36.7 C)  Resp: 18    General: nontoxic Cardiovascular: RRR Respiratory: S, NT, ND   Discharge Instructions    Discharge instructions    Complete by:  As directed   Stop IV antibiotics at dialysis (cefepime and vancomycin).  Double prednisone whenever you are ill.  Renal diet     Increase activity slowly    Complete by:  As directed           Current Discharge Medication List    CONTINUE these medications which have NOT CHANGED   Details  aspirin EC 81 MG tablet Take 81 mg by mouth daily.      atorvastatin (LIPITOR) 40 MG tablet Take 40 mg by mouth daily.      azaTHIOprine (IMURAN) 50 MG tablet Take 75 mg by mouth every morning.     b complex-vitamin c-folic acid (NEPHRO-VITE) 0.8 MG TABS tablet Take 1 tablet by mouth at bedtime.    calcium acetate (PHOSLO) 667 MG capsule Take 667 mg by mouth 3 (three) times daily with meals.     carvedilol (COREG) 25 MG tablet Take 25 mg by mouth at bedtime.    colesevelam (WELCHOL) 625 MG  tablet Take 3,750 mg by mouth daily. Takes 6 tabs daily    famotidine (PEPCID AC) 10 MG chewable tablet Chew 10 mg by mouth at bedtime.     HUMALOG KWIKPEN 100 UNIT/ML SOPN Inject 6-10 Units into the skin 2 (two) times daily. Per sliding scale    insulin glargine (LANTUS) 100 UNIT/ML injection Inject 20 Units into the skin every morning.    isosorbide dinitrate (ISORDIL) 40 MG tablet Take 40 mg by mouth 3 (three) times daily.      predniSONE (DELTASONE) 1 MG tablet Take 2 mg by mouth daily with breakfast.    sirolimus (RAPAMUNE) 2 MG tablet Take 2 mg by mouth daily.      STOP taking these  medications     cefTAZidime 2 g in dextrose 5 % 50 mL      Vancomycin (VANCOCIN) 750 MG/150ML SOLN        Allergies  Allergen Reactions  . Lisinopril Swelling    Lips and tongue swell  . Norvasc [Amlodipine Besylate] Rash    Flushing  . Penicillins Rash   Follow-up Information    Follow up with [name], MD.   Specialty:  Family Medicine   Why:  If symptoms worsen   Contact information:   [place] [id number] [phone]        The results of significant diagnostics from this hospitalization (including imaging, microbiology, ancillary and laboratory) are listed below for reference.    Significant Diagnostic Studies: Dg Chest 2 View  04/06/2014   CLINICAL DATA:  Fever for 1 week.  EXAM: CHEST  2 VIEW  COMPARISON:  03/27/2014  FINDINGS: Dense airspace disease at the left base. In the setting of fever this is most consistent with pneumonia. No effusion or cavitation. There may also be mild airspace opacity at the peripheral right base. There is cardiomegaly which is stable from previous. Negative aortic and hilar contours. Status post central venous stenting.  IMPRESSION: Left lower lobe pneumonia.   Electronically Signed   By: Jorje Guild M.D.   On: 04/06/2014 20:14   Dg Chest 2 View  03/27/2014   CLINICAL DATA:  Fatigue for 2 weeks.  Loss of appetite.  Fever.  EXAM: CHEST  2 VIEW  COMPARISON:  07/28/2013 and 07/24/2013  FINDINGS: Two views of the chest again demonstrate vascular stents in the upper chest bilaterally. Again noted are patchy densities in the left lower lobe. These densities are more prominent than the previous examinations and could represent acute on chronic disease. Upper lungs are clear. Patient has median sternotomy wires. Atherosclerotic calcifications at the aortic arch. Heart size is stable. No significant pleural effusions.  IMPRESSION: Increased patchy densities in the left lower lobe. Findings suggest acute on chronic  disease.   Electronically Signed   By: Markus Daft M.D.   On: 03/27/2014 18:43   Ct Head Wo Contrast  04/06/2014   CLINICAL DATA:  Headache beginning today.  Fever.  EXAM: CT HEAD WITHOUT CONTRAST  TECHNIQUE: Contiguous axial images were obtained from the base of the skull through the vertex without intravenous contrast.  COMPARISON:  Head CT scan 07/19/2010.  FINDINGS: There is some cortical atrophy which is unchanged in appearance. No evidence of acute intracranial abnormality including infarct, hemorrhage, mass lesion, mass effect, midline shift or abnormal extra-axial fluid collection is identified. There is no hydrocephalus or pneumocephalus. The calvarium is intact. There is near complete opacification of left maxillary sinus. Ethmoid air cell disease is  also seen on the left. Carotid atherosclerosis is noted.  IMPRESSION: No acute intracranial abnormality.  Near-complete opacification of the left maxillary sinus and mild left ethmoid air cell disease.   Electronically Signed   By: Inge Rise M.D.   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  EXAM: CT CHEST, ABDOMEN, AND PELVIS WITH CONTRAST  TECHNIQUE: Multidetector CT imaging of the chest, abdomen and pelvis was performed following the standard protocol during bolus administration of intravenous contrast. Sagittal and coronal MPR images reconstructed from axial data set.  CONTRAST:  66mL OMNIPAQUE IOHEXOL 300 MG/ML SOLN IV. Dilute oral contrast.  COMPARISON:  PET-CT 09/27/2010, CT abdomen and pelvis 06/01/2008, CT chest 03/24/2008  FINDINGS: CT CHEST FINDINGS  Extensive atherosclerotic calcification of the aorta and coronary arteries with evidence of prior CABG and coronary  stenting.  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  Enlargement of cardiac chambers.  No thoracic adenopathy.  Pulmonary infiltrates are identified within the lingula and BILATERAL lower lobes question pneumonia.  Remaining lungs clear.  No definite pleural effusion or pneumothorax.  Diffuse osseous demineralization.  CT ABDOMEN AND PELVIS FINDINGS  Atrophic polycystic appearing kidneys.  Intermediate attenuation cyst at inferior pole LEFT kidney image 83, 14 x 12 mm.  Several hepatic cysts up to 2.2 x 2.1 cm.  Low-attenuation lesion posterior lateral upper to mid spleen, 2.2 x 1.6 cm image 65, new.  Remainder of liver, spleen, pancreas, and adrenal glands normal.  Transplant kidney RIGHT iliac fossa without gross mass or hydronephrosis.  Scattered colonic diverticulosis without evidence of diverticulitis.  Stomach and bowel loops otherwise normal appearance.  No mass, adenopathy, free fluid or inflammatory process.  Scattered atherosclerotic calcifications.  No acute osseous findings.  IMPRESSION: Extensive atherosclerotic disease with aneurysmal dilatation of the ascending thoracic aorta 4.2 x 4.3 cm.  Post CABG and coronary artery stenting.  BILATERAL lower lobe and lingular infiltrates question pneumonia.  Cystic disease of the liver and native kidneys with unremarkable transplant kidney at RIGHT iliac fossa.  Colonic diverticulosis.  Nonspecific 2.2 x 1.6 cm diameter low-attenuation lesion within the spleen; this has a peripheral base and fills in with contrast on incomplete delayed imaging.  Differential diagnosis would include perfusion artifact, hemangioma, and mass lesion.  Remainder of exam unremarkable without identification of an acute inflammatory process.   Electronically Signed   By: Lavonia Dana M.D.   On: 04/07/2014 17:32   Ct Abdomen Pelvis W Contrast  04/07/2014   CLINICAL DATA:  Fever of unknown origin with fatigue for several weeks, immunosuppressed status,  personal history of diabetes mellitus, hypertension, coronary artery disease post MI and CABG, CHF, renal failure post renal transplant and transplant rejection.  EXAM: CT CHEST, ABDOMEN, AND PELVIS WITH CONTRAST  TECHNIQUE: Multidetector CT imaging of the chest, abdomen and pelvis was performed following the standard protocol during bolus administration of intravenous contrast. Sagittal and coronal MPR images reconstructed from axial data set.  CONTRAST:  3mL OMNIPAQUE IOHEXOL 300 MG/ML SOLN IV. Dilute oral contrast.  COMPARISON:  PET-CT 09/27/2010, CT abdomen and pelvis 06/01/2008, CT chest 03/24/2008  FINDINGS: CT CHEST FINDINGS  Extensive atherosclerotic calcification of the aorta and coronary arteries with evidence of prior CABG and coronary stenting.  Aneurysmal  dilatation ascending thoracic aorta 4.2 x 4.3 cm image 32.  Enlargement of cardiac chambers.  No thoracic adenopathy.  Pulmonary infiltrates are identified within the lingula and BILATERAL lower lobes question pneumonia.  Remaining lungs clear.  No definite pleural effusion or pneumothorax.  Diffuse osseous demineralization.  CT ABDOMEN AND PELVIS FINDINGS  Atrophic polycystic appearing kidneys.  Intermediate attenuation cyst at inferior pole LEFT kidney image 83, 14 x 12 mm.  Several hepatic cysts up to 2.2 x 2.1 cm.  Low-attenuation lesion posterior lateral upper to mid spleen, 2.2 x 1.6 cm image 65, new.  Remainder of liver, spleen, pancreas, and adrenal glands normal.  Transplant kidney RIGHT iliac fossa without gross mass or hydronephrosis.  Scattered colonic diverticulosis without evidence of diverticulitis.  Stomach and bowel loops otherwise normal appearance.  No mass, adenopathy, free fluid or inflammatory process.  Scattered atherosclerotic calcifications.  No acute osseous findings.  IMPRESSION: Extensive atherosclerotic disease with aneurysmal dilatation of the ascending thoracic aorta 4.2 x 4.3 cm.  Post CABG and coronary artery stenting.   BILATERAL lower lobe and lingular infiltrates question pneumonia.  Cystic disease of the liver and native kidneys with unremarkable transplant kidney at RIGHT iliac fossa.  Colonic diverticulosis.  Nonspecific 2.2 x 1.6 cm diameter low-attenuation lesion within the spleen; this has a peripheral base and fills in with contrast on incomplete delayed imaging.  Differential diagnosis would include perfusion artifact, hemangioma, and mass lesion.  Remainder of exam unremarkable without identification of an acute inflammatory process.   Electronically Signed   By: Lavonia Dana M.D.   On: 04/07/2014 17:32    Microbiology: Recent Results (from the past 240 hour(s))  Blood culture (routine x 2)     Status: None (Preliminary result)   Collection Time: 04/06/14  6:55 PM  Result Value Ref Range Status   Specimen Description BLOOD RIGHT ARM  Final   Special Requests BOTTLES DRAWN AEROBIC AND ANAEROBIC 5ML  Final   Culture  Setup Time   Final    04/07/2014 01:07 Performed at [facility]    Culture   Final           BLOOD CULTURE RECEIVED NO GROWTH TO DATE CULTURE WILL BE HELD FOR 5 DAYS BEFORE ISSUING A FINAL NEGATIVE REPORT Performed at [facility]    Report Status PENDING  Incomplete  Blood culture (routine x 2)     Status: None (Preliminary result)   Collection Time: 04/06/14  7:12 PM  Result Value Ref Range Status   Specimen Description BLOOD RIGHT ARM  Final   Special Requests BOTTLES DRAWN AEROBIC AND ANAEROBIC 5ML  Final   Culture  Setup Time   Final    04/07/2014 01:07 Performed at [facility]    Culture   Final           BLOOD CULTURE RECEIVED NO GROWTH TO DATE CULTURE WILL BE HELD FOR 5 DAYS BEFORE ISSUING A FINAL NEGATIVE REPORT Performed at [facility]    Report Status PENDING  Incomplete  MRSA PCR Screening     Status: None   Collection Time: 04/07/14  7:02 AM  Result Value Ref Range Status   MRSA by PCR NEGATIVE NEGATIVE Final    Comment:         The GeneXpert MRSA Assay (FDA approved for NASAL specimens only), is one component of a comprehensive MRSA colonization surveillance program. It is not intended to diagnose MRSA infection nor to guide or monitor treatment for  MRSA infections.      Labs:   Sodium  139 131        Potassium  3.5 3.3       Chloride  90 87       CO2  28 25       BUN  16 40       Creatinine, Ser  5.03 6.95       Calcium  9.3 8.7       GFR calc non Af Amer  11 7       GFR calc Af Amer  12 8       Glucose, Bld  75 132       Anion gap  21 19       Alkaline Phosphatase  62        Albumin  2.6        AST  47        ALT  17        Total Protein  7.1        Bilirubin, Direct  <0.2        Indirect Bilirubin  NOT CA...        Total Bilirubin  0.3        CARDIAC PROFILE   Troponin I  <0.30        Troponin i, poc  0.32        OTHER CHEM   CRP   19.4       Lactic Acid, Venous  3.34 1.6       CBC   WBC  4.9 3.7   3.4    RBC  2.83 2.56   2.79    Hemoglobin  8.8 8.0   8.9    HCT  28.1 24.2   27.7    MCV  99.3 94.5   99.3    MCH  31.1 31.3   31.9    MCHC  31.3 33.1   32.1    RDW  14.9 14.5   14.5    Platelets  109 98   138    OTHER HEMATOLOGY   Sed Rate   110       DIABETES   Glucose, Bld  75 132       THYROID   TSH  1.960        BACTERIAL TESTS   RPR   NON REAC       VIRAL TESTS   EBV VCA IgG  =22.0 U/mL = Positive " style="width: 50px; text-align: right; display: inline-block;">191.0=22.0 U/mL = Positive " style="border: currentColor; width: 5px; height: 10px;" [email or web address]">        EBV VCA IgM  =44.0 U/mL = Positive Clinical Stage VCA IgG VCA IgM EA EBV NA Susceptibility - - - - Very Early Infection +/- +/- - - Established Infection + + +/- - Recent Infection + + +/- +/- Pas..." style="width: 50px; text-align: right; display: inline-block;"><10.0=44.0 U/mL = Positive Clinical  Stage VCA IgG VCA IgM EA EBV NA Susceptibility - - - - Very Early Infection +/- +/- - - Established Infection + + +/- - Recent Infection + + +/- +/- Pas..." style="border: currentColor; width: 5px; height: 10px;" src="[email or web address]        HEPATITIS A   Hep A IgM   NON RE...       HEPATITIS B   Hepatitis B Surface Ag   NEGATIVE       Hep B C IgM   NON RE.Marland KitchenMarland Kitchen  HEPATITIS C   HCV Ab   NEGATIVE       FUNGAL TESTS   Crypto Ag   NEGATIVE       Cryptococcal Ag Titer   NOT IN...       HIV TESTS   HIV 1&2 Ab, 4th Generation  NONREA      Signed:  Jeremie Abdelaziz L  [facility] 04/10/2014, 8:36 AM

## 2014-04-11 ENCOUNTER — Inpatient Hospital Stay (HOSPITAL_COMMUNITY)
Admission: EM | Admit: 2014-04-11 | Discharge: 2014-04-15 | DRG: 166 | Disposition: A | Discharge status: Home / Self Care | Payer: Medicare Other | Attending: Internal Medicine | Admitting: Internal Medicine

## 2014-04-11 ENCOUNTER — Encounter (HOSPITAL_COMMUNITY): Payer: Self-pay | Admitting: Adult Health

## 2014-04-11 ENCOUNTER — Emergency Department (HOSPITAL_COMMUNITY): Payer: Medicare Other

## 2014-04-11 DIAGNOSIS — Z7982 Long term (current) use of aspirin: Secondary | ICD-10-CM | POA: Diagnosis not present

## 2014-04-11 DIAGNOSIS — Y95 Nosocomial condition: Secondary | ICD-10-CM | POA: Diagnosis present

## 2014-04-11 DIAGNOSIS — I4901 Ventricular fibrillation: Secondary | ICD-10-CM

## 2014-04-11 DIAGNOSIS — J188 Other pneumonia, unspecified organism: Secondary | ICD-10-CM | POA: Diagnosis present

## 2014-04-11 DIAGNOSIS — J159 Unspecified bacterial pneumonia: Principal | ICD-10-CM | POA: Diagnosis present

## 2014-04-11 DIAGNOSIS — T8612 Kidney transplant failure: Secondary | ICD-10-CM

## 2014-04-11 DIAGNOSIS — Z880 Allergy status to penicillin: Secondary | ICD-10-CM

## 2014-04-11 DIAGNOSIS — D631 Anemia in chronic kidney disease: Secondary | ICD-10-CM | POA: Diagnosis present

## 2014-04-11 DIAGNOSIS — K5790 Diverticulosis of intestine, part unspecified, without perforation or abscess without bleeding: Secondary | ICD-10-CM | POA: Diagnosis present

## 2014-04-11 DIAGNOSIS — I2510 Atherosclerotic heart disease of native coronary artery without angina pectoris: Secondary | ICD-10-CM | POA: Diagnosis present

## 2014-04-11 DIAGNOSIS — Z8249 Family history of ischemic heart disease and other diseases of the circulatory system: Secondary | ICD-10-CM

## 2014-04-11 DIAGNOSIS — I252 Old myocardial infarction: Secondary | ICD-10-CM | POA: Diagnosis not present

## 2014-04-11 DIAGNOSIS — T8611 Kidney transplant rejection: Secondary | ICD-10-CM

## 2014-04-11 DIAGNOSIS — Z87891 Personal history of nicotine dependence: Secondary | ICD-10-CM | POA: Diagnosis not present

## 2014-04-11 DIAGNOSIS — Z951 Presence of aortocoronary bypass graft: Secondary | ICD-10-CM

## 2014-04-11 DIAGNOSIS — R197 Diarrhea, unspecified: Secondary | ICD-10-CM

## 2014-04-11 DIAGNOSIS — J189 Pneumonia, unspecified organism: Secondary | ICD-10-CM

## 2014-04-11 DIAGNOSIS — N185 Chronic kidney disease, stage 5: Secondary | ICD-10-CM

## 2014-04-11 DIAGNOSIS — J181 Lobar pneumonia, unspecified organism: Secondary | ICD-10-CM

## 2014-04-11 DIAGNOSIS — E1129 Type 2 diabetes mellitus with other diabetic kidney complication: Secondary | ICD-10-CM | POA: Diagnosis present

## 2014-04-11 DIAGNOSIS — I120 Hypertensive chronic kidney disease with stage 5 chronic kidney disease or end stage renal disease: Secondary | ICD-10-CM | POA: Diagnosis present

## 2014-04-11 DIAGNOSIS — R531 Weakness: Secondary | ICD-10-CM

## 2014-04-11 DIAGNOSIS — Z833 Family history of diabetes mellitus: Secondary | ICD-10-CM | POA: Diagnosis not present

## 2014-04-11 DIAGNOSIS — Z888 Allergy status to other drugs, medicaments and biological substances status: Secondary | ICD-10-CM

## 2014-04-11 DIAGNOSIS — R509 Fever, unspecified: Secondary | ICD-10-CM

## 2014-04-11 DIAGNOSIS — I739 Peripheral vascular disease, unspecified: Secondary | ICD-10-CM | POA: Diagnosis present

## 2014-04-11 DIAGNOSIS — N186 End stage renal disease: Secondary | ICD-10-CM | POA: Diagnosis present

## 2014-04-11 DIAGNOSIS — Z941 Heart transplant status: Secondary | ICD-10-CM | POA: Diagnosis not present

## 2014-04-11 DIAGNOSIS — E2740 Unspecified adrenocortical insufficiency: Secondary | ICD-10-CM | POA: Diagnosis present

## 2014-04-11 DIAGNOSIS — E8889 Other specified metabolic disorders: Secondary | ICD-10-CM | POA: Diagnosis present

## 2014-04-11 DIAGNOSIS — E785 Hyperlipidemia, unspecified: Secondary | ICD-10-CM | POA: Diagnosis present

## 2014-04-11 DIAGNOSIS — N289 Disorder of kidney and ureter, unspecified: Secondary | ICD-10-CM | POA: Diagnosis present

## 2014-04-11 DIAGNOSIS — Z9225 Personal history of immunosupression therapy: Secondary | ICD-10-CM

## 2014-04-11 DIAGNOSIS — Z992 Dependence on renal dialysis: Secondary | ICD-10-CM | POA: Diagnosis not present

## 2014-04-11 DIAGNOSIS — Z9889 Other specified postprocedural states: Secondary | ICD-10-CM | POA: Insufficient documentation

## 2014-04-11 DIAGNOSIS — Z955 Presence of coronary angioplasty implant and graft: Secondary | ICD-10-CM | POA: Diagnosis not present

## 2014-04-11 DIAGNOSIS — D849 Immunodeficiency, unspecified: Secondary | ICD-10-CM | POA: Diagnosis present

## 2014-04-11 DIAGNOSIS — D899 Disorder involving the immune mechanism, unspecified: Secondary | ICD-10-CM

## 2014-04-11 DIAGNOSIS — I10 Essential (primary) hypertension: Secondary | ICD-10-CM

## 2014-04-11 DIAGNOSIS — D689 Coagulation defect, unspecified: Secondary | ICD-10-CM

## 2014-04-11 LAB — I-STAT ARTERIAL BLOOD GAS, ED
Acid-Base Excess: 9 mmol/L — ABNORMAL HIGH (ref 0.0–2.0)
Bicarbonate: 30.3 mEq/L — ABNORMAL HIGH (ref 20.0–24.0)
O2 Saturation: 96 %
PCO2 ART: 28.6 mmHg — AB (ref 35.0–45.0)
PO2 ART: 69 mmHg — AB (ref 80.0–100.0)
Patient temperature: 100.1
TCO2: 31 mmol/L (ref 0–100)
pH, Arterial: 7.636 (ref 7.350–7.450)

## 2014-04-11 LAB — COMPREHENSIVE METABOLIC PANEL
ALT: 21 U/L (ref 0–53)
AST: 31 U/L (ref 0–37)
Albumin: 2.3 g/dL — ABNORMAL LOW (ref 3.5–5.2)
Alkaline Phosphatase: 54 U/L (ref 39–117)
Anion gap: 17 — ABNORMAL HIGH (ref 5–15)
BUN: 16 mg/dL (ref 6–23)
CO2: 27 mEq/L (ref 19–32)
Calcium: 8.7 mg/dL (ref 8.4–10.5)
Chloride: 93 mEq/L — ABNORMAL LOW (ref 96–112)
Creatinine, Ser: 3.84 mg/dL — ABNORMAL HIGH (ref 0.50–1.35)
GFR calc Af Amer: 17 mL/min — ABNORMAL LOW (ref >90)
GFR calc non Af Amer: 15 mL/min — ABNORMAL LOW (ref >90)
Glucose, Bld: 62 mg/dL — ABNORMAL LOW (ref 70–99)
Potassium: 3.3 mEq/L — ABNORMAL LOW (ref 3.7–5.3)
Sodium: 137 mEq/L (ref 137–147)
Total Bilirubin: 0.3 mg/dL (ref 0.3–1.2)
Total Protein: 6.4 g/dL (ref 6.0–8.3)

## 2014-04-11 LAB — CBC WITH DIFFERENTIAL/PLATELET
Basophils Absolute: 0 10*3/uL (ref 0.0–0.1)
Basophils Relative: 0 % (ref 0–1)
Eosinophils Absolute: 0 10*3/uL (ref 0.0–0.7)
Eosinophils Relative: 0 % (ref 0–5)
HCT: 27.1 % — ABNORMAL LOW (ref 39.0–52.0)
Hemoglobin: 8.6 g/dL — ABNORMAL LOW (ref 13.0–17.0)
Lymphocytes Relative: 20 % (ref 12–46)
Lymphs Abs: 1.1 10*3/uL (ref 0.7–4.0)
MCH: 30.4 pg (ref 26.0–34.0)
MCHC: 31.7 g/dL (ref 30.0–36.0)
MCV: 95.8 fL (ref 78.0–100.0)
Monocytes Absolute: 0.8 10*3/uL (ref 0.1–1.0)
Monocytes Relative: 14 % — ABNORMAL HIGH (ref 3–12)
Neutro Abs: 3.7 10*3/uL (ref 1.7–7.7)
Neutrophils Relative %: 66 % (ref 43–77)
Platelets: 140 10*3/uL — ABNORMAL LOW (ref 150–400)
RBC: 2.83 MIL/uL — ABNORMAL LOW (ref 4.22–5.81)
RDW: 15 % (ref 11.5–15.5)
WBC: 5.6 10*3/uL (ref 4.0–10.5)

## 2014-04-11 LAB — I-STAT CG4 LACTIC ACID, ED: Lactic Acid, Venous: 2.04 mmol/L (ref 0.5–2.2)

## 2014-04-11 MED ORDER — ACETAMINOPHEN 325 MG PO TABS
650.0000 mg | ORAL_TABLET | Freq: Four times a day (QID) | ORAL | Status: DC | PRN
  Administered 2014-04-11: 650 mg via ORAL
  Filled 2014-04-11: qty 2

## 2014-04-11 MED ORDER — RENA-VITE PO TABS
1.0000 | ORAL_TABLET | Freq: Every day | ORAL | Status: DC
  Administered 2014-04-12 – 2014-04-14 (×4): 1 via ORAL
  Filled 2014-04-11 (×5): qty 1

## 2014-04-11 MED ORDER — HEPARIN SODIUM (PORCINE) 5000 UNIT/ML IJ SOLN
5000.0000 [IU] | Freq: Three times a day (TID) | INTRAMUSCULAR | Status: DC
  Administered 2014-04-12 – 2014-04-15 (×9): 5000 [IU] via SUBCUTANEOUS
  Filled 2014-04-11 (×13): qty 1

## 2014-04-11 MED ORDER — INSULIN GLARGINE 100 UNIT/ML ~~LOC~~ SOLN
20.0000 [IU] | Freq: Every morning | SUBCUTANEOUS | Status: DC
  Administered 2014-04-12 – 2014-04-15 (×4): 20 [IU] via SUBCUTANEOUS
  Filled 2014-04-11 (×4): qty 0.2

## 2014-04-11 MED ORDER — VANCOMYCIN HCL IN DEXTROSE 750-5 MG/150ML-% IV SOLN
750.0000 mg | INTRAVENOUS | Status: DC
  Administered 2014-04-13: 750 mg via INTRAVENOUS
  Filled 2014-04-11: qty 150

## 2014-04-11 MED ORDER — ISOSORBIDE DINITRATE 20 MG PO TABS
40.0000 mg | ORAL_TABLET | Freq: Three times a day (TID) | ORAL | Status: DC
  Administered 2014-04-12 – 2014-04-15 (×10): 40 mg via ORAL
  Filled 2014-04-11 (×13): qty 2

## 2014-04-11 MED ORDER — VANCOMYCIN HCL 10 G IV SOLR
1500.0000 mg | Freq: Once | INTRAVENOUS | Status: DC
  Filled 2014-04-11: qty 1500

## 2014-04-11 MED ORDER — SODIUM CHLORIDE 0.9 % IV BOLUS (SEPSIS)
500.0000 mL | Freq: Once | INTRAVENOUS | Status: AC
Start: 2014-04-11 — End: 2014-04-11
  Administered 2014-04-11: 500 mL via INTRAVENOUS

## 2014-04-11 MED ORDER — COLESEVELAM HCL 625 MG PO TABS
3750.0000 mg | ORAL_TABLET | Freq: Every day | ORAL | Status: DC
  Administered 2014-04-12 – 2014-04-15 (×4): 3750 mg via ORAL
  Filled 2014-04-11 (×4): qty 6

## 2014-04-11 MED ORDER — DEXTROSE 5 % IV SOLN
2.0000 g | INTRAVENOUS | Status: DC
  Filled 2014-04-11: qty 2

## 2014-04-11 MED ORDER — DEXTROSE 5 % IV SOLN
2.0000 g | Freq: Once | INTRAVENOUS | Status: AC
  Administered 2014-04-11: 2 g via INTRAVENOUS
  Filled 2014-04-11: qty 2

## 2014-04-11 MED ORDER — VANCOMYCIN HCL 10 G IV SOLR
1500.0000 mg | Freq: Once | INTRAVENOUS | Status: AC
  Administered 2014-04-11: 1500 mg via INTRAVENOUS
  Filled 2014-04-11: qty 1500

## 2014-04-11 MED ORDER — AZATHIOPRINE 50 MG PO TABS
75.0000 mg | ORAL_TABLET | Freq: Every morning | ORAL | Status: DC
  Administered 2014-04-12 – 2014-04-15 (×4): 75 mg via ORAL
  Filled 2014-04-11 (×4): qty 2

## 2014-04-11 MED ORDER — FAMOTIDINE 10 MG PO TABS
10.0000 mg | ORAL_TABLET | Freq: Every day | ORAL | Status: DC
  Administered 2014-04-12 – 2014-04-14 (×4): 10 mg via ORAL
  Filled 2014-04-11 (×6): qty 1

## 2014-04-11 MED ORDER — ATORVASTATIN CALCIUM 40 MG PO TABS
40.0000 mg | ORAL_TABLET | Freq: Every day | ORAL | Status: DC
  Administered 2014-04-12 – 2014-04-15 (×4): 40 mg via ORAL
  Filled 2014-04-11 (×4): qty 1

## 2014-04-11 MED ORDER — INSULIN ASPART 100 UNIT/ML ~~LOC~~ SOLN
0.0000 [IU] | SUBCUTANEOUS | Status: DC
  Administered 2014-04-12: 2 [IU] via SUBCUTANEOUS
  Administered 2014-04-12: 11 [IU] via SUBCUTANEOUS
  Administered 2014-04-13: 2 [IU] via SUBCUTANEOUS
  Administered 2014-04-13: 3 [IU] via SUBCUTANEOUS
  Administered 2014-04-13: 5 [IU] via SUBCUTANEOUS
  Administered 2014-04-14: 2 [IU] via SUBCUTANEOUS
  Administered 2014-04-14 – 2014-04-15 (×2): 3 [IU] via SUBCUTANEOUS

## 2014-04-11 MED ORDER — PREDNISONE 50 MG PO TABS
50.0000 mg | ORAL_TABLET | Freq: Every day | ORAL | Status: DC
  Administered 2014-04-12: 50 mg via ORAL
  Filled 2014-04-11 (×3): qty 1

## 2014-04-11 MED ORDER — SIROLIMUS 1 MG PO TABS
2.0000 mg | ORAL_TABLET | Freq: Every day | ORAL | Status: DC
  Administered 2014-04-12 – 2014-04-15 (×4): 2 mg via ORAL
  Filled 2014-04-11 (×4): qty 2

## 2014-04-11 MED ORDER — DEXTROSE 5 % IV SOLN
1.0000 g | Freq: Three times a day (TID) | INTRAVENOUS | Status: DC
  Filled 2014-04-11: qty 1

## 2014-04-11 MED ORDER — ASPIRIN EC 81 MG PO TBEC
81.0000 mg | DELAYED_RELEASE_TABLET | Freq: Every day | ORAL | Status: DC
  Administered 2014-04-12 – 2014-04-15 (×4): 81 mg via ORAL
  Filled 2014-04-11 (×4): qty 1

## 2014-04-11 NOTE — ED Notes (Signed)
I Stat Lactic Acid results shown to Dr. Benita Stabile

## 2014-04-11 NOTE — ED Provider Notes (Signed)
CSN: [id number]     Arrival date & time 04/11/14  2008 History   First MD Initiated Contact with Patient 04/11/14 2016     Chief Complaint  Patient presents with  . Fever     (Consider location/radiation/quality/duration/timing/severity/associated sxs/prior Treatment) Patient is a 68 y.o. male presenting with fever. The history is provided by the patient. No language interpreter was used.  Fever Max temp prior to arrival:  101.8 Temp source:  Oral Severity:  Moderate Onset quality:  Gradual Duration:  5 days Timing:  Constant Progression:  Worsening Chronicity:  New Relieved by:  Acetaminophen Worsened by:  Nothing tried Ineffective treatments:  None tried Associated symptoms: confusion   Associated symptoms: no chest pain, no congestion, no cough, no diarrhea, no dysuria, no headaches, no myalgias, no nausea, no rash, no rhinorrhea, no sore throat and no vomiting   Risk factors: immunosuppression (kidney (2008 [facility]) and heart (2001 [facility]))     Past Medical History  Diagnosis Date  . Diabetes mellitus   . Hypertension   . Myocardial infarction 1985; 1990  . Angina   . Coronary artery disease   . Dysrhythmia   . DVT (deep venous thrombosis) ~ 12/2010    LLE  . Anemia   . Blood transfusion 06/1999    post heart transplant  . Peripheral vascular disease   . CHF (congestive heart failure) 2001    beffore transplant  . Renal failure     Hemodialysis MWF last 2 years, sse dr [name] nephrology, goes to [facility]  . Renal insufficiency    Past Surgical History  Procedure Laterality Date  . Nephrectomy transplanted organ  2008  . Av fistula repair      rt. a=fore arm fistula  . Av fistula placement  ~ 01/2010    "this was my 2nd fistula placement"  . Heart transplant  06/1999  . Coronary angioplasty with stent placement  1985; 1990  . Colonoscopy  03/17/2012    Procedure: COLONOSCOPY;  Surgeon: Lear Ng, MD;  Location: [facility];   Service: Endoscopy;  Laterality: N/A;  . Coronary artery bypass graft  1990    CABG X3  . Colonoscopy with propofol N/A 12/14/2013    Procedure: COLONOSCOPY WITH PROPOFOL;  Surgeon: Lear Ng, MD;  Location: [facility];  Service: Endoscopy;  Laterality: N/A;   Family History  Problem Relation Age of Onset  . Heart disease Mother   . Hyperlipidemia Mother   . Hypertension Mother   . Hyperlipidemia Father   . Hypertension Father   . Diabetes Brother   . Heart disease Brother     Heart Disease before age 78  . Hyperlipidemia Brother   . Hypertension Brother    History  Substance Use Topics  . Smoking status: Former Smoker -- 1.00 packs/day for 20 years    Types: Cigarettes    Quit date: 05/27/1988  . Smokeless tobacco: Never Used  . Alcohol Use: Yes     Comment: "Holidays"    Review of Systems  Constitutional: Positive for fever.  HENT: Negative for congestion, rhinorrhea and sore throat.   Respiratory: Negative for cough and shortness of breath.   Cardiovascular: Negative for chest pain.  Gastrointestinal: Negative for nausea, vomiting, abdominal pain and diarrhea.  Genitourinary: Negative for dysuria and hematuria.  Musculoskeletal: Negative for myalgias.  Skin: Negative for rash.  Neurological: Negative for syncope, light-headedness and headaches.  Psychiatric/Behavioral: Positive for confusion.  All other systems reviewed and are  negative.     Allergies  Lisinopril; Norvasc; and Penicillins  Home Medications   Prior to Admission medications   Medication Sig Start Date End Date Taking? Authorizing Provider  aspirin EC 81 MG tablet Take 81 mg by mouth daily.      Historical Provider, MD  atorvastatin (LIPITOR) 40 MG tablet Take 40 mg by mouth daily.      Historical Provider, MD  azaTHIOprine (IMURAN) 50 MG tablet Take 75 mg by mouth every morning.     Historical Provider, MD  b complex-vitamin c-folic acid (NEPHRO-VITE) 0.8 MG TABS tablet Take 1 tablet  by mouth at bedtime.    Historical Provider, MD  calcium acetate (PHOSLO) 667 MG capsule Take 667 mg by mouth 3 (three) times daily with meals.  11/01/12   Historical Provider, MD  carvedilol (COREG) 25 MG tablet Take 25 mg by mouth at bedtime.    Historical Provider, MD  colesevelam (WELCHOL) 625 MG tablet Take 3,750 mg by mouth daily. Takes 6 tabs daily    Historical Provider, MD  famotidine (PEPCID AC) 10 MG chewable tablet Chew 10 mg by mouth at bedtime.     Historical Provider, MD  HUMALOG KWIKPEN 100 UNIT/ML SOPN Inject 6-10 Units into the skin 2 (two) times daily. Per sliding scale 11/17/12   Historical Provider, MD  insulin glargine (LANTUS) 100 UNIT/ML injection Inject 20 Units into the skin every morning.    Historical Provider, MD  isosorbide dinitrate (ISORDIL) 40 MG tablet Take 40 mg by mouth 3 (three) times daily.      Historical Provider, MD  predniSONE (DELTASONE) 1 MG tablet Take 2 mg by mouth daily with breakfast.    Historical Provider, MD  sirolimus (RAPAMUNE) 2 MG tablet Take 2 mg by mouth daily.    Historical Provider, MD   Pulse 106  Temp(Src) 100.1 F (37.8 C) (Oral)  Resp 22  SpO2 98% Physical Exam  ED Course  Procedures (including critical care time) Labs Review Labs Reviewed  CBC WITH DIFFERENTIAL - Abnormal; Notable for the following:    RBC 2.83 (*)    Hemoglobin 8.6 (*)    HCT 27.1 (*)    Platelets 140 (*)    Monocytes Relative 14 (*)    All other components within normal limits  COMPREHENSIVE METABOLIC PANEL - Abnormal; Notable for the following:    Potassium 3.3 (*)    Chloride 93 (*)    Glucose, Bld 62 (*)    Creatinine, Ser 3.84 (*)    Albumin 2.3 (*)    GFR calc non Af Amer 15 (*)    GFR calc Af Amer 17 (*)    Anion gap 17 (*)    All other components within normal limits  I-STAT ARTERIAL BLOOD GAS, ED - Abnormal; Notable for the following:    pH, Arterial 7.636 (*)    pCO2 arterial 28.6 (*)    pO2, Arterial 69.0 (*)    Bicarbonate 30.3 (*)     Acid-Base Excess 9.0 (*)    All other components within normal limits  CULTURE, BLOOD (ROUTINE X 2)  CULTURE, BLOOD (ROUTINE X 2)  CULTURE, EXPECTORATED SPUTUM-ASSESSMENT  GRAM STAIN  GLUCOSE, CAPILLARY  BLOOD GAS, ARTERIAL  LEGIONELLA ANTIGEN, URINE  STREP PNEUMONIAE URINARY ANTIGEN  CBC WITH DIFFERENTIAL  COMPREHENSIVE METABOLIC PANEL  HEMOGLOBIN A1C  HIV ANTIBODY (ROUTINE TESTING)  PRO B NATRIURETIC PEPTIDE  I-STAT CG4 LACTIC ACID, ED    Imaging Review Dg Chest Port 1 View  04/11/2014  CLINICAL DATA:  Fever and chills.  EXAM: PORTABLE CHEST - 1 VIEW  COMPARISON:  Chest CT, 04/07/2014.  Chest radiographs, 04/06/2014.  FINDINGS: Patchy areas of left mid and lower lung consolidation and milder right lung base consolidation, this similar to the prior studies allowing for differences in technique and inspiratory volumes. No new areas of lung consolidation. No convincing pulmonary edema. No pleural effusion or pneumothorax.  Changes from cardiac surgery are stable. Cardiac silhouette is mildly enlarged. No mediastinal or hilar masses. Stable right subclavian vein stent.  IMPRESSION: Persist consolidation in the left mid and lower lung and in the right lung base similar to the prior studies. This is most suggestive of multifocal pneumonia. No new abnormalities.   Electronically Signed   By: Lajean Manes M.D.   On: 04/11/2014 20:46     EKG Interpretation None      MDM   Final diagnoses:  Fever  Immunosuppressed status  Healthcare associated bacterial pneumonia    8:18 PM Pt is a 68 y.o. male with pertinent PMHX of DM, HTN, previous MI, CAD, CHF who presents to the ED with fever mild confusion. Recent admission for pneumonia. Fever as high as 101.8 today. No symptoms. Denies cough, shortness of breath. No urinary symptoms no sick contacts. Heart transplant at [facility] in 2001. Had kidney transplant 2008 at [place]. Is immunosuppressed on prednisone, azathioprine and sirolimus.    On exam: mild confusion, febrile, diminished globally.  Abdomen soft, NT, ND does not make urine. Concern for pneumonia or other infectious symptoms. Plan for CBC, CMP, lactic acid, CXR AP portable. Blood cultures  CXr AP portable for fever showed multifocal pneumonia. Will start Vanc and Cefepime. Patient has pcn allergy   Review of labs: CBC: no leukocytosis, H&H 8.6/27.1 CMP: hypokalemia, hypochloremia. No elevated LFTs. Cr 3.84 ABg: 7.636/28.6/69.0/30.3 istat lactic acid: 2.04  Plan for admission to hospitalist for multifocal pneumonia. Plan for admission to [facility]. Mental status improving. Safe for floor and telemetry  Labs, EKG and imaging reviewed by myself and considered in medical decision making if ordered.  Imaging interpreted by radiology. Pt was discussed with my attending, Dr. Walden Field, MD 04/12/14 1517  Mariea Clonts, MD 04/13/14 [phone]

## 2014-04-11 NOTE — ED Notes (Signed)
Kidney and heart transplant recipient, recently D/C from hospital with fever. Developed fever today of 101.8, told to come back. Pt is shivering, denies cough, nausea, vomiting and pain. Slightly confused

## 2014-04-11 NOTE — ED Notes (Signed)
Pt does not make urine.

## 2014-04-11 NOTE — Progress Notes (Signed)
ANTIBIOTIC CONSULT NOTE - INITIAL  Pharmacy Consult for Vancomycin/Cefepime Indication: rule out sepsis  Allergies  Allergen Reactions  . Lisinopril Swelling    Lips and tongue swell  . Norvasc [Amlodipine Besylate] Rash    Flushing  . Penicillins Rash    Patient Measurements:  Actual body weight: 73 kg  Vital Signs: Temp: 100.1 F (37.8 C) (11/16 2013) Temp Source: Oral (11/16 2013) BP: 115/56 mmHg (11/16 2100) Pulse Rate: 105 (11/16 2100) Intake/Output from previous day:   Intake/Output from this shift:    Labs:  Recent Labs  04/10/14 0840 04/11/14 2108  WBC 3.4* 5.6  HGB 8.9* 8.6*  PLT 138* 140*   Estimated Creatinine Clearance: 10.4 mL/min (by C-G formula based on Cr of 6.95). No results for input(s): VANCOTROUGH, VANCOPEAK, VANCORANDOM, GENTTROUGH, GENTPEAK, GENTRANDOM, TOBRATROUGH, TOBRAPEAK, TOBRARND, AMIKACINPEAK, AMIKACINTROU, AMIKACIN in the last 72 hours.   Microbiology: Recent Results (from the past 720 hour(s))  Blood culture (routine x 2)     Status: None   Collection Time: 03/27/14  5:51 PM  Result Value Ref Range Status   Specimen Description BLOOD LEFT ARM  Final   Special Requests BOTTLES DRAWN AEROBIC AND ANAEROBIC 5CC  Final   Culture  Setup Time   Final    03/28/2014 02:25 Performed at [facility]    Culture   Final    NO GROWTH 5 DAYS Performed at [facility]    Report Status 04/04/2014 FINAL  Final  Blood culture (routine x 2)     Status: None   Collection Time: 03/27/14  6:20 PM  Result Value Ref Range Status   Specimen Description BLOOD LEFT ARM  Final   Special Requests BOTTLES DRAWN AEROBIC AND ANAEROBIC 10CC  Final   Culture  Setup Time   Final    03/28/2014 02:25 Performed at [place]   Final    NO GROWTH 5 DAYS Performed at [facility]    Report Status 04/04/2014 FINAL  Final  Culture, blood (routine x 2)     Status: None   Collection Time: 03/30/14  5:00 PM  Result  Value Ref Range Status   Specimen Description BLOOD LEFT HAND  Final   Special Requests BOTTLES DRAWN AEROBIC ONLY 2CC  Final   Culture  Setup Time   Final    03/30/2014 22:26 Performed at [place]   Final    NO GROWTH 5 DAYS Performed at [facility]    Report Status 04/05/2014 FINAL  Final  Culture, blood (routine x 2)     Status: None   Collection Time: 03/30/14  5:06 PM  Result Value Ref Range Status   Specimen Description BLOOD LEFT ARM  Final   Special Requests BOTTLES DRAWN AEROBIC ONLY 3.5CC  Final   Culture  Setup Time   Final    03/30/2014 22:20 Performed at [place]   Final    NO GROWTH 5 DAYS Performed at [facility]    Report Status 04/05/2014 FINAL  Final  Blood culture (routine x 2)     Status: None (Preliminary result)   Collection Time: 04/06/14  6:55 PM  Result Value Ref Range Status   Specimen Description BLOOD RIGHT ARM  Final   Special Requests BOTTLES DRAWN AEROBIC AND ANAEROBIC 5ML  Final   Culture  Setup Time   Final    04/07/2014 01:07 Performed at  [facility]   Final           BLOOD CULTURE RECEIVED NO GROWTH TO DATE CULTURE WILL BE HELD FOR 5 DAYS BEFORE ISSUING A FINAL NEGATIVE REPORT Performed at [facility]    Report Status PENDING  Incomplete  Blood culture (routine x 2)     Status: None (Preliminary result)   Collection Time: 04/06/14  7:12 PM  Result Value Ref Range Status   Specimen Description BLOOD RIGHT ARM  Final   Special Requests BOTTLES DRAWN AEROBIC AND ANAEROBIC 5ML  Final   Culture  Setup Time   Final    04/07/2014 01:07 Performed at [facility]    Culture   Final           BLOOD CULTURE RECEIVED NO GROWTH TO DATE CULTURE WILL BE HELD FOR 5 DAYS BEFORE ISSUING A FINAL NEGATIVE REPORT Performed at [facility]    Report Status PENDING  Incomplete  MRSA PCR Screening     Status: None   Collection Time: 04/07/14   7:02 AM  Result Value Ref Range Status   MRSA by PCR NEGATIVE NEGATIVE Final    Comment:        The GeneXpert MRSA Assay (FDA approved for NASAL specimens only), is one component of a comprehensive MRSA colonization surveillance program. It is not intended to diagnose MRSA infection nor to guide or monitor treatment for MRSA infections.     Medical History: Past Medical History  Diagnosis Date  . Diabetes mellitus   . Hypertension   . Myocardial infarction 1985; 1990  . Angina   . Coronary artery disease   . Dysrhythmia   . DVT (deep venous thrombosis) ~ 12/2010    LLE  . Anemia   . Blood transfusion 06/1999    post heart transplant  . Peripheral vascular disease   . CHF (congestive heart failure) 2001    beffore transplant  . Renal failure     Hemodialysis MWF last 2 years, sse dr [name] nephrology, goes to [facility]  . Renal insufficiency     Medications:  Scheduled:  . [START ON 04/13/2014] vancomycin  750 mg Intravenous Q M,W,F-HD   Assessment: 68 yo m who presented to the ED on 11/16 after being discharged yesterday for fever.  Patient is a heart and kidney transplant on HD MWF.  Patient has an allergy to penicillins, but cefepime was given during recent hospital admission with no reaction, so MD wanted to give cefepime instead of aztreonam.  Wbc 3.4, tmax 100.1, CrCl ~10 ml/min.  Goal of Therapy:  Pre-HD level 15-25 mcg/ml Post-HD vancomycin level 5-15 mcg/ml  Plan:  Vancomycin 1500 mg IV x 1 Followed by vancomycin 750 mg IV qMWF with HD Cefepime 2gm IV x 1 Followed by cefepime 2gm IV qMWF  Monitor CBC, temperature curve, C&S, clinical course  Cassie L. Nicole Kindred, PharmD Clinical Pharmacy Resident Pager: [phone] 04/11/2014 9:45 PM

## 2014-04-11 NOTE — Progress Notes (Signed)
New Admission Note:   Arrival Method: Stretcher from ED Mental Orientation: alert and oriented to person and place Telemetry: N/A Assessment: Completed Skin: Intact IV: L shoulder Pain: Denies Tubes: N/A Safety Measures: Safety Fall Prevention Plan has been given, discussed. Admission: Completed [facility] [place] Orientation: Patient has been orientated to the room, unit and staff.  Family: Spouse at bedside  Orders have been reviewed and implemented. Will continue to monitor the patient. Call light has been placed within reach and bed alarm has been activated.   [employer], RN-BC Phone number: [phone]

## 2014-04-11 NOTE — H&P (Addendum)
Hospitalist Admission History and Physical  Patient name: Brandon Sharp Medical record number: [id number] Date of birth: October 25, 1945 Age: 68 y.o. Gender: male  Primary Care Provider: [name], MD  Chief Complaint: recurrent HCAP  History of Present Illness:This is a 68 y.o. 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 Pt noted to have been admitted multiple times over past 1-2 months including 11/11-11/15/2015, 11/1-11/09/2013 for HCAP.  ID consulted. Workup including blood and resp cultures, HIV, EBV, cryptococcal ag, acute hepatitis panel. C abd and pel negative. Abx were stopped and prednisone increased to 10 mg. Per the wife, pt has been having intermittent malaise, fatigue, weakness at home. Went to HD today. Had temp during HD that required ? Abx. Pt was sent home with instructions that if fever returned to call EMS. Per wife, pt had temp around 101-102 at home and EMS was subsequently called.  On presentation to ER, tmax 100.1, HR 90s-100s, resp 10s-20s, BP 100s, satting in mid 90s on RA, desats to upper 80s on movement/ambulation. WBC 5.6, hgb 8.6, Cr 3.84, K 3.3, glu 62. CXR shows persistent consolidation in the left mid and lower lung and in the right lung base similar to the prior studies. Pt started on vanc and cefepime for empiric coverage. Lactate WNL. ABG pending.  Wife does report remote hx/o fungal PNA in the past.  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  Assessment and Plan: Brandon Sharp is a 68 y.o. year old male presenting with recurrent HCAP     Active Problems:   HCAP (healthcare-associated pneumonia)   Healthcare associated bacterial pneumonia   1- Recurrent HCAP  -vanc and cefepime per pharmacy  -blood, sputum cultures -urine strep and legionella  -recent extensive w/u by ID  -ID consult in am  -may benefit from bronchoscopy via pulm as this has been a recurrent issue in setting of immunosuppression and ? Hx/o fungal PNA in the past.  -supplemental O2 prn  -follow   2- ESRD  - s/p HD today  -renal consult in am   3- CAD s/p CABG to heart transplant/HTN -no active CP currently  -tele bed  -check 2D ECHO r/o cardiac component of sxs though less likely  -noted LLN BPs in setting of above  -s/p HD -hold BB  -stress dose prednisne   4- immunosuppression/adrenal insufficiency  -cont immunosuppresant meds  -stress dose steroids given above  -prednisone 10-->60 mg daily    5- IDDM -cont lantus  -q4 CBGs -SSI -A1C  -follow   FEN/GI: renal/carb modified diet  Prophylaxis: sub q heparin  Disposition: pending further evaluation  Code Status:Full Code    Patient Active Problem List   Diagnosis Date Noted  . Healthcare associated bacterial pneumonia 04/11/2014  . Immunosuppressed status   . Kidney transplant failure   . Floaters in visual field   . Fever, unknown origin 04/06/2014  . Fever of undetermined origin 04/06/2014  . Sepsis 03/30/2014  . Blood poisoning   . ESRD on dialysis   . Left lower lobe pneumonia   . Personal history of colonic polyps 12/14/2013  . HCAP (healthcare-associated pneumonia) 07/24/2013  . ESRD on hemodialysis 07/24/2013  .  Diarrhea 07/24/2013  . Generalized weakness 07/24/2013  . Pain in limb- Left leg 11/24/2012  . Atherosclerosis of native arteries of the extremities with intermittent claudication 11/24/2012  . Fever 04/16/2011  . HTN (hypertension), malignant 04/16/2011  . DM type 2 causing complication 34/19/3790  . CKD (chronic kidney disease), stage V 04/16/2011  .  Chronic kidney disease (CKD), stage IV (severe) 04/15/2011  . DVT (deep venous thrombosis) 04/15/2011  . Coagulopathy 04/15/2011  . Heart transplanted 04/15/2011  . Renal transplant failure and rejection 04/15/2011  . Ventricular fibrillation 04/15/2011    Class: History of  . S/P CABG (coronary artery bypass graft) 04/15/2011    Class: History of  . Dyslipidemia 04/15/2011  . Uremia syndrome 04/15/2011    Class: Present on Admission   Past Medical History: Past Medical History  Diagnosis Date  . Diabetes mellitus   . Hypertension   . Myocardial infarction 1985; 1990  . Angina   . Coronary artery disease   . Dysrhythmia   . DVT (deep venous thrombosis) ~ 12/2010    LLE  . Anemia   . Blood transfusion 06/1999    post heart transplant  . Peripheral vascular disease   . CHF (congestive heart failure) 2001    beffore transplant  . Renal failure     Hemodialysis MWF last 2 years, sse dr [name] nephrology, goes to [place] [facility]  . Renal insufficiency     Past Surgical History: Past Surgical History  Procedure Laterality Date  . Nephrectomy transplanted organ  2008  . Av fistula repair      rt. a=fore arm fistula  . Av fistula placement  ~ 01/2010    "this was my 2nd fistula placement"  . Heart transplant  06/1999  . Coronary angioplasty with stent placement  1985; 1990  . Colonoscopy  03/17/2012    Procedure: COLONOSCOPY;  Surgeon: Lear Ng, MD;  Location: [facility];  Service: Endoscopy;  Laterality: N/A;  . Coronary artery bypass graft  1990    CABG X3  . Colonoscopy with propofol N/A 12/14/2013    Procedure: COLONOSCOPY WITH PROPOFOL;  Surgeon: Lear Ng, MD;  Location: [facility];  Service: Endoscopy;  Laterality: N/A;    Social History: History   Social History  . Marital Status: Married    Spouse Name: N/A    Number of Children: N/A  . Years of Education: N/A   Social History Main Topics  . Smoking status: Former  Smoker -- 1.00 packs/day for 20 years    Types: Cigarettes    Quit date: 05/27/1988  . Smokeless tobacco: Never Used  . Alcohol Use: Yes     Comment: "Holidays"  . Drug Use: No  . Sexual Activity: None   Other Topics Concern  . None   Social History Narrative    Family History: Family History  Problem Relation Age of Onset  . Heart disease Mother   . Hyperlipidemia Mother   . Hypertension Mother   . Hyperlipidemia Father   . Hypertension Father   . Diabetes Brother   . Heart disease Brother     Heart Disease before age 69  . Hyperlipidemia Brother   . Hypertension Brother     Allergies: Allergies  Allergen Reactions  . Lisinopril Swelling    Lips and tongue swell  . Niacin And Related Other (See Comments)    unknown  . Norvasc [Amlodipine Besylate] Rash    Flushing  . Penicillins Rash  Current Facility-Administered Medications  Medication Dose Route Frequency Provider Last Rate Last Dose  . acetaminophen (TYLENOL) tablet 650 mg  650 mg Oral Q6H PRN Joanell Rising, MD   650 mg at 04/11/14 2041  . [START ON 04/12/2014] aspirin EC tablet 81 mg  81 mg Oral Daily Shanda Howells, MD      . Derrill Memo ON 04/12/2014] atorvastatin (LIPITOR) tablet 40 mg  40 mg Oral Daily Shanda Howells, MD      . Derrill Memo ON 04/12/2014] azaTHIOprine (IMURAN) tablet 75 mg  75 mg Oral q morning - 10a Shanda Howells, MD      . ceFEPIme (MAXIPIME) 1 g in dextrose 5 % 50 mL IVPB  1 g Intravenous 3 times per day Shanda Howells, MD      . Derrill Memo ON 04/13/2014] ceFEPIme (MAXIPIME) 2 g in dextrose 5 % 50 mL IVPB  2 g Intravenous Q M,W,F-1800 Cassie Jodean Lima, RPH      . [START ON 04/12/2014] colesevelam [facility]) tablet 3,750 mg  3,750 mg Oral Daily Shanda Howells, MD      . famotidine (PEPCID AC) chewable tablet 10 mg  10 mg Oral QHS Shanda Howells, MD      . heparin injection 5,000 Units  5,000 Units Subcutaneous 3 times per day Shanda Howells, MD      . Derrill Memo ON 04/12/2014] insulin glargine (LANTUS)  injection 20 Units  20 Units Subcutaneous q morning - 10a Shanda Howells, MD      . isosorbide dinitrate (ISORDIL) tablet 40 mg  40 mg Oral TID Shanda Howells, MD      . multivitamin (RENA-VIT) tablet 1 tablet  1 tablet Oral QHS Shanda Howells, MD      . Derrill Memo ON 04/12/2014] sirolimus (RAPAMUNE) tablet 2 mg  2 mg Oral Daily Shanda Howells, MD      . vancomycin (VANCOCIN) 1,500 mg in sodium chloride 0.9 % 500 mL IVPB  1,500 mg Intravenous Once Cassie Jodean Lima, RPH 250 mL/hr at 04/11/14 2242 1,500 mg at 04/11/14 2242   Followed by  . [START ON 04/13/2014] vancomycin (VANCOCIN) IVPB 750 mg/150 ml premix  750 mg Intravenous Q M,W,F-HD Horatio Pel, [facility]       Current Outpatient Prescriptions  Medication Sig Dispense Refill  . aspirin EC 81 MG tablet Take 81 mg by mouth daily.      Marland Kitchen atorvastatin (LIPITOR) 40 MG tablet Take 40 mg by mouth daily.      Marland Kitchen azaTHIOprine (IMURAN) 50 MG tablet Take 75 mg by mouth every morning.     Marland Kitchen b complex-vitamin c-folic acid (NEPHRO-VITE) 0.8 MG TABS tablet Take 1 tablet by mouth at bedtime.    . calcium acetate (PHOSLO) 667 MG capsule Take 667 mg by mouth 3 (three) times daily with meals.     . carvedilol (COREG) 25 MG tablet Take 25 mg by mouth at bedtime.    . colesevelam (WELCHOL) 625 MG tablet Take 3,750 mg by mouth daily. Takes 6 tabs daily    . famotidine (PEPCID AC) 10 MG chewable tablet Chew 10 mg by mouth at bedtime.     Marland Kitchen HUMALOG KWIKPEN 100 UNIT/ML SOPN Inject 6-10 Units into the skin 2 (two) times daily. Per sliding scale    . insulin glargine (LANTUS) 100 UNIT/ML injection Inject 20 Units into the skin every morning.    . isosorbide dinitrate (ISORDIL) 40 MG tablet Take 40 mg by mouth 3 (three) times daily.      . predniSONE (  DELTASONE) 1 MG tablet Take 2 mg by mouth daily with breakfast.    . sirolimus (RAPAMUNE) 2 MG tablet Take 2 mg by mouth daily.    . [DISCONTINUED] doxazosin (CARDURA) 8 MG tablet Take 8 mg by mouth 2 (two) times daily.      .  [DISCONTINUED] hydrALAZINE (APRESOLINE) 25 MG tablet Take 75 mg by mouth 3 (three) times daily.      . [DISCONTINUED] warfarin (COUMADIN) 2.5 MG tablet Take 2.5 mg by mouth daily.      Review Of Systems: 12 point ROS negative except as noted above in HPI.  Physical Exam: Filed Vitals:   04/11/14 2230  BP: 115/99  Pulse: 101  Temp:   Resp: 12    General: alert and cooperative HEENT: PERRLA and extra ocular movement intact Heart: S1, S2 normal, no murmur, rub or gallop, regular rate and rhythm Lungs: clear to auscultation, no wheezes or rales and unlabored breathing Abdomen: abdomen is soft without significant tenderness, masses, organomegaly or guarding Extremities: extremities normal, atraumatic, no cyanosis or edema Skin:no rashes Neurology: normal without focal findings  Labs and Imaging: Lab Results  Component Value Date/Time   NA 137 04/11/2014 09:08 PM   K 3.3* 04/11/2014 09:08 PM   CL 93* 04/11/2014 09:08 PM   CO2 27 04/11/2014 09:08 PM   BUN 16 04/11/2014 09:08 PM   CREATININE 3.84* 04/11/2014 09:08 PM   GLUCOSE 62* 04/11/2014 09:08 PM   Lab Results  Component Value Date   WBC 5.6 04/11/2014   HGB 8.6* 04/11/2014   HCT 27.1* 04/11/2014   MCV 95.8 04/11/2014   PLT 140* 04/11/2014    Dg Chest Port 1 View  04/11/2014   CLINICAL DATA:  Fever and chills.  EXAM: PORTABLE CHEST - 1 VIEW  COMPARISON:  Chest CT, 04/07/2014.  Chest radiographs, 04/06/2014.  FINDINGS: Patchy areas of left mid and lower lung consolidation and milder right lung base consolidation, this similar to the prior studies allowing for differences in technique and inspiratory volumes. No new areas of lung consolidation. No convincing pulmonary edema. No pleural effusion or pneumothorax.  Changes from cardiac surgery are stable. Cardiac silhouette is mildly enlarged. No mediastinal or hilar masses. Stable right subclavian vein stent.  IMPRESSION: Persist consolidation in the left mid and lower lung and  in the right lung base similar to the prior studies. This is most suggestive of multifocal pneumonia. No new abnormalities.   Electronically Signed   By: Lajean Manes M.D.   On: 04/11/2014 20:46           Shanda Howells MD  Pager: [phone]

## 2014-04-12 ENCOUNTER — Encounter (HOSPITAL_COMMUNITY): Payer: Self-pay | Admitting: *Deleted

## 2014-04-12 DIAGNOSIS — D899 Disorder involving the immune mechanism, unspecified: Secondary | ICD-10-CM

## 2014-04-12 DIAGNOSIS — J159 Unspecified bacterial pneumonia: Principal | ICD-10-CM

## 2014-04-12 DIAGNOSIS — T8612 Kidney transplant failure: Secondary | ICD-10-CM

## 2014-04-12 DIAGNOSIS — E1129 Type 2 diabetes mellitus with other diabetic kidney complication: Secondary | ICD-10-CM

## 2014-04-12 DIAGNOSIS — Z941 Heart transplant status: Secondary | ICD-10-CM

## 2014-04-12 DIAGNOSIS — Z992 Dependence on renal dialysis: Secondary | ICD-10-CM

## 2014-04-12 DIAGNOSIS — R509 Fever, unspecified: Secondary | ICD-10-CM | POA: Insufficient documentation

## 2014-04-12 LAB — COMPREHENSIVE METABOLIC PANEL
ALK PHOS: 48 U/L (ref 39–117)
ALT: 18 U/L (ref 0–53)
AST: 26 U/L (ref 0–37)
Albumin: 2.2 g/dL — ABNORMAL LOW (ref 3.5–5.2)
Anion gap: 16 — ABNORMAL HIGH (ref 5–15)
BUN: 20 mg/dL (ref 6–23)
CO2: 28 mEq/L (ref 19–32)
Calcium: 8.1 mg/dL — ABNORMAL LOW (ref 8.4–10.5)
Chloride: 96 mEq/L (ref 96–112)
Creatinine, Ser: 4.31 mg/dL — ABNORMAL HIGH (ref 0.50–1.35)
GFR, EST AFRICAN AMERICAN: 15 mL/min — AB (ref >90)
GFR, EST NON AFRICAN AMERICAN: 13 mL/min — AB (ref >90)
GLUCOSE: 88 mg/dL (ref 70–99)
POTASSIUM: 3.5 meq/L — AB (ref 3.7–5.3)
Sodium: 140 mEq/L (ref 137–147)
Total Bilirubin: 0.3 mg/dL (ref 0.3–1.2)
Total Protein: 5.7 g/dL — ABNORMAL LOW (ref 6.0–8.3)

## 2014-04-12 LAB — GLUCOSE, CAPILLARY
Glucose-Capillary: 81 mg/dL (ref 70–99)
Glucose-Capillary: 91 mg/dL (ref 70–99)
Glucose-Capillary: 80 mg/dL (ref 70–99)
Glucose-Capillary: 138 mg/dL — ABNORMAL HIGH (ref 70–99)
Glucose-Capillary: 261 mg/dL — ABNORMAL HIGH (ref 70–99)
Glucose-Capillary: 318 mg/dL — ABNORMAL HIGH (ref 70–99)

## 2014-04-12 LAB — CBC WITH DIFFERENTIAL/PLATELET
Basophils Absolute: 0 10*3/uL (ref 0.0–0.1)
Basophils Relative: 0 % (ref 0–1)
EOS PCT: 1 % (ref 0–5)
Eosinophils Absolute: 0 10*3/uL (ref 0.0–0.7)
HCT: 25.7 % — ABNORMAL LOW (ref 39.0–52.0)
Hemoglobin: 8.1 g/dL — ABNORMAL LOW (ref 13.0–17.0)
LYMPHS ABS: 1.1 10*3/uL (ref 0.7–4.0)
LYMPHS PCT: 22 % (ref 12–46)
MCH: 30.3 pg (ref 26.0–34.0)
MCHC: 31.5 g/dL (ref 30.0–36.0)
MCV: 96.3 fL (ref 78.0–100.0)
Monocytes Absolute: 0.7 10*3/uL (ref 0.1–1.0)
Monocytes Relative: 14 % — ABNORMAL HIGH (ref 3–12)
Neutro Abs: 3.1 10*3/uL (ref 1.7–7.7)
Neutrophils Relative %: 63 % (ref 43–77)
PLATELETS: 127 10*3/uL — AB (ref 150–400)
RBC: 2.67 MIL/uL — AB (ref 4.22–5.81)
RDW: 15.1 % (ref 11.5–15.5)
WBC: 4.9 10*3/uL (ref 4.0–10.5)

## 2014-04-12 LAB — HEMOGLOBIN A1C
HEMOGLOBIN A1C: 7.2 % — AB (ref <5.7)
Mean Plasma Glucose: 160 mg/dL — ABNORMAL HIGH (ref <117)

## 2014-04-12 LAB — PRO B NATRIURETIC PEPTIDE: Pro B Natriuretic peptide (BNP): 45045 pg/mL — ABNORMAL HIGH (ref 0–125)

## 2014-04-12 LAB — HIV ANTIBODY (ROUTINE TESTING W REFLEX): HIV 1&2 Ab, 4th Generation: NONREACTIVE

## 2014-04-12 LAB — SEDIMENTATION RATE: SED RATE: 130 mm/h — AB (ref 0–16)

## 2014-04-12 MED ORDER — DOXERCALCIFEROL 4 MCG/2ML IV SOLN
2.0000 ug | INTRAVENOUS | Status: DC
  Administered 2014-04-13 – 2014-04-15 (×2): 2 ug via INTRAVENOUS
  Filled 2014-04-12 (×2): qty 2

## 2014-04-12 MED ORDER — CALCIUM ACETATE 667 MG PO CAPS
667.0000 mg | ORAL_CAPSULE | Freq: Three times a day (TID) | ORAL | Status: DC
  Administered 2014-04-12 – 2014-04-15 (×5): 667 mg via ORAL
  Filled 2014-04-12 (×11): qty 1

## 2014-04-12 MED ORDER — DARBEPOETIN ALFA 100 MCG/0.5ML IJ SOSY
100.0000 ug | PREFILLED_SYRINGE | INTRAMUSCULAR | Status: DC
  Administered 2014-04-13: 100 ug via INTRAVENOUS
  Filled 2014-04-12: qty 0.5

## 2014-04-12 MED ORDER — PREDNISONE 10 MG PO TABS
10.0000 mg | ORAL_TABLET | Freq: Every day | ORAL | Status: DC
  Administered 2014-04-13 – 2014-04-15 (×3): 10 mg via ORAL
  Filled 2014-04-12 (×4): qty 1

## 2014-04-12 MED ORDER — CARVEDILOL 6.25 MG PO TABS
6.2500 mg | ORAL_TABLET | Freq: Every day | ORAL | Status: DC
  Administered 2014-04-12 – 2014-04-14 (×3): 6.25 mg via ORAL
  Filled 2014-04-12 (×4): qty 1

## 2014-04-12 NOTE — Consult Note (Signed)
Indication for Consultation:  Management of ESRD/hemodialysis; anemia, hypertension/volume and secondary hyperparathyroidism  HPI: Brandon Sharp is a 68 y.o. male who presented to the ED last night for eval of fever and weakness. He receives HD MWF @ [facility]. History of renal tx in 2008, back on HD for about 3 years, heart transplant in 2001, DM, CAD.  He as recently admitted twice in the last month 11/2-11/5 and 11/12-11/15 with fever and PNA. He was at HD yesterday and began having chills, blood cultures were drawn and pt was given vanc and fortaz. When he got home he had temp of 101 so he came to the ED for evaluation. He has otherwise been feeling well with no other complaints.   Past Medical History  Diagnosis Date  . Diabetes mellitus   . Hypertension   . Myocardial infarction 1985; 1990  . Angina   . Coronary artery disease   . Dysrhythmia   . DVT (deep venous thrombosis) ~ 12/2010    LLE  . Anemia   . Blood transfusion 06/1999    post heart transplant  . Peripheral vascular disease   . CHF (congestive heart failure) 2001    beffore transplant  . Renal failure     Hemodialysis MWF last 2 years, sse dr [name] nephrology, goes to [place] [facility]  . Renal insufficiency    Past Surgical History  Procedure Laterality Date  . Nephrectomy transplanted organ  2008  . Av fistula repair      rt. a=fore arm fistula  . Av fistula placement  ~ 01/2010    "this was my 2nd fistula placement"  . Heart transplant  06/1999  . Coronary angioplasty with stent placement  1985; 1990  . Colonoscopy  03/17/2012    Procedure: COLONOSCOPY;  Surgeon: Lear Ng, MD;  Location: [facility];  Service: Endoscopy;  Laterality: N/A;  . Coronary artery bypass graft  1990    CABG X3  . Colonoscopy with propofol N/A 12/14/2013    Procedure: COLONOSCOPY WITH PROPOFOL;  Surgeon: Lear Ng, MD;  Location: [facility];  Service: Endoscopy;  Laterality: N/A;   Family History   Problem Relation Age of Onset  . Heart disease Mother   . Hyperlipidemia Mother   . Hypertension Mother   . Hyperlipidemia Father   . Hypertension Father   . Diabetes Brother   . Heart disease Brother     Heart Disease before age 24  . Hyperlipidemia Brother   . Hypertension Brother    Social History:  reports that he quit smoking about 25 years ago. His smoking use included Cigarettes. He has a 20 pack-year smoking history. He has never used smokeless tobacco. He reports that he drinks alcohol. He reports that he does not use illicit drugs. Allergies  Allergen Reactions  . Lisinopril Swelling    Lips and tongue swell  . Niacin And Related Other (See Comments)    unknown  . Norvasc [Amlodipine Besylate] Rash    Flushing  . Penicillins Rash   Prior to Admission medications   Medication Sig Start Date End Date Taking? Authorizing Provider  aspirin EC 81 MG tablet Take 81 mg by mouth daily.     Yes Historical Provider, MD  atorvastatin (LIPITOR) 40 MG tablet Take 40 mg by mouth daily.     Yes Historical Provider, MD  azaTHIOprine (IMURAN) 50 MG tablet Take 75 mg by mouth every morning.    Yes Historical Provider, MD  b complex-vitamin  c-folic acid (NEPHRO-VITE) 0.8 MG TABS tablet Take 1 tablet by mouth at bedtime.   Yes Historical Provider, MD  calcium acetate (PHOSLO) 667 MG capsule Take 667 mg by mouth 3 (three) times daily with meals.  11/01/12  Yes Historical Provider, MD  carvedilol (COREG) 25 MG tablet Take 25 mg by mouth at bedtime.   Yes Historical Provider, MD  colesevelam (WELCHOL) 625 MG tablet Take 3,750 mg by mouth daily. Takes 6 tabs daily   Yes Historical Provider, MD  famotidine (PEPCID AC) 10 MG chewable tablet Chew 10 mg by mouth at bedtime.    Yes Historical Provider, MD  HUMALOG KWIKPEN 100 UNIT/ML SOPN Inject 6-10 Units into the skin 2 (two) times daily. Per sliding scale 11/17/12  Yes Historical Provider, MD  insulin glargine (LANTUS) 100 UNIT/ML injection Inject  20 Units into the skin every morning.   Yes Historical Provider, MD  isosorbide dinitrate (ISORDIL) 40 MG tablet Take 40 mg by mouth 3 (three) times daily.     Yes Historical Provider, MD  predniSONE (DELTASONE) 1 MG tablet Take 2 mg by mouth daily with breakfast.   Yes Historical Provider, MD  sirolimus (RAPAMUNE) 2 MG tablet Take 2 mg by mouth daily.   Yes Historical Provider, MD   Current Facility-Administered Medications  Medication Dose Route Frequency Provider Last Rate Last Dose  . acetaminophen (TYLENOL) tablet 650 mg  650 mg Oral Q6H PRN Joanell Rising, MD   650 mg at 04/11/14 2041  . aspirin EC tablet 81 mg  81 mg Oral Daily Shanda Howells, MD   81 mg at 04/12/14 0957  . atorvastatin (LIPITOR) tablet 40 mg  40 mg Oral Daily Shanda Howells, MD   40 mg at 04/12/14 0957  . azaTHIOprine (IMURAN) tablet 75 mg  75 mg Oral q morning - 10a Shanda Howells, MD   75 mg at 04/12/14 0957  . [START ON 04/13/2014] ceFEPIme (MAXIPIME) 2 g in dextrose 5 % 50 mL IVPB  2 g Intravenous Q M,W,F-1800 Cassie L Stewart, RPH      . colesevelam [facility]) tablet 3,750 mg  3,750 mg Oral Daily Shanda Howells, MD   3,750 mg at 04/12/14 0958  . famotidine (PEPCID) tablet 10 mg  10 mg Oral QHS Shanda Howells, MD   10 mg at 04/12/14 0112  . heparin injection 5,000 Units  5,000 Units Subcutaneous 3 times per day Shanda Howells, MD   5,000 Units at 04/12/14 [phone]  . insulin aspart (novoLOG) injection 0-15 Units  0-15 Units Subcutaneous 6 times per day Shanda Howells, MD   0 Units at 04/12/14 0113  . insulin glargine (LANTUS) injection 20 Units  20 Units Subcutaneous q morning - 10a Shanda Howells, MD   20 Units at 04/12/14 [phone]  . isosorbide dinitrate (ISORDIL) tablet 40 mg  40 mg Oral TID Shanda Howells, MD   40 mg at 04/12/14 0958  . multivitamin (RENA-VIT) tablet 1 tablet  1 tablet Oral QHS Shanda Howells, MD   1 tablet at 04/12/14 0115  . predniSONE (DELTASONE) tablet 50 mg  50 mg Oral Q breakfast Shanda Howells, MD   50 mg at  04/12/14 0957  . sirolimus (RAPAMUNE) tablet 2 mg  2 mg Oral Daily Shanda Howells, MD   2 mg at 04/12/14 0958  . [START ON 04/13/2014] vancomycin (VANCOCIN) IVPB 750 mg/150 ml premix  750 mg Intravenous Q M,W,F-HD Horatio Pel, [facility]       Labs: Basic Metabolic Panel:  Recent  Labs Lab 04/08/14 0326 04/11/14 2108 04/12/14 0105  NA 131* 137 140  K 3.3* 3.3* 3.5*  CL 87* 93* 96  CO2 25 27 28   GLUCOSE 132* 62* 88  BUN 40* 16 20  CREATININE 6.95* 3.84* 4.31*  CALCIUM 8.7 8.7 8.1*   Liver Function Tests:  Recent Labs Lab 04/06/14 1855 04/11/14 2108 04/12/14 0105  AST 47* 31 26  ALT 17 21 18   ALKPHOS 62 54 48  BILITOT 0.3 0.3 0.3  PROT 7.1 6.4 5.7*  ALBUMIN 2.6* 2.3* 2.2*   No results for input(s): LIPASE, AMYLASE in the last 168 hours. No results for input(s): AMMONIA in the last 168 hours. CBC:  Recent Labs Lab 04/07/14 0300 04/08/14 0326 04/10/14 0840 04/11/14 2108 04/12/14 0105  WBC 4.9 3.7* 3.4* 5.6 4.9  NEUTROABS  --   --   --  3.7 3.1  HGB 8.8* 8.0* 8.9* 8.6* 8.1*  HCT 28.1* 24.2* 27.7* 27.1* 25.7*  MCV 99.3 94.5 99.3 95.8 96.3  PLT 109* 98* 138* 140* 127*   Cardiac Enzymes:  Recent Labs Lab 04/06/14 1855  TROPONINI <0.30   CBG:  Recent Labs Lab 04/09/14 2133 04/10/14 0801 04/11/14 2326 04/12/14 0412 04/12/14 0744  GLUCAP 214* 104* 81 91 80   Iron Studies: No results for input(s): IRON, TIBC, TRANSFERRIN, FERRITIN in the last 72 hours. Studies/Results: Dg Chest Port 1 View  04/11/2014   CLINICAL DATA:  Fever and chills.  EXAM: PORTABLE CHEST - 1 VIEW  COMPARISON:  Chest CT, 04/07/2014.  Chest radiographs, 04/06/2014.  FINDINGS: Patchy areas of left mid and lower lung consolidation and milder right lung base consolidation, this similar to the prior studies allowing for differences in technique and inspiratory volumes. No new areas of lung consolidation. No convincing pulmonary edema. No pleural effusion or pneumothorax.  Changes from cardiac  surgery are stable. Cardiac silhouette is mildly enlarged. No mediastinal or hilar masses. Stable right subclavian vein stent.  IMPRESSION: Persist consolidation in the left mid and lower lung and in the right lung base similar to the prior studies. This is most suggestive of multifocal pneumonia. No new abnormalities.   Electronically Signed   By: Lajean Manes M.D.   On: 04/11/2014 20:46     Review of Systems: Gen: Positive fever, chills and weakness HEENT: No visual complaints, No history of Retinopathy. Normal external appearance No Epistaxis or Sore throat. No sinusitis.   CV: Denies chest pain, angina, palpitations, syncope, orthopnea, PND, peripheral edema, and claudication. Resp: Denies dyspnea at rest, dyspnea with exercise, cough, sputum, wheezing, coughing up blood, and pleurisy. GI: Denies vomiting blood, jaundice, and fecal incontinence.   Denies dysphagia or odynophagia. Eating well  GU : anuric MS: Denies joint pain, limitation of movement, and swelling, stiffness, low back pain, extremity pain. Denies muscle weakness, cramps, atrophy.  No use of non steroidal antiinflammatory drugs. Derm: Denies rash, itching, dry skin, hives, moles, warts, or unhealing ulcers.  Psych: Denies depression, anxiety, memory loss, suicidal ideation, hallucinations, paranoia, and confusion. Heme: Denies bruising, bleeding, and enlarged lymph nodes. Neuro: No headache.  No diplopia. No dysarthria.  No dysphasia.  No history of CVA.  No Seizures. No paresthesias.  No weakness. Endocrine No DM.  No Thyroid disease.  No Adrenal disease.  Physical Exam: Filed Vitals:   04/11/14 2230 04/11/14 2328 04/12/14 0413 04/12/14 0927  BP: 115/99 141/63 134/59 132/74  Pulse: 101 99 103 101  Temp:  99.2 F (37.3 C) 100 F (37.8 C) 100.4  F (38 C)  TempSrc:  Oral Oral Oral  Resp: 12 14 14 18   Height:  6' (1.829 m)    Weight:  72.235 kg (159 lb 4 oz)    SpO2: 96% 100% 99% 97%     General: Well developed, well  nourished, in no acute distress. Head: Normocephalic, atraumatic, sclera non-icteric, mucus membranes are moist Neck: Supple. JVD not elevated. Lungs: Clear, decreased bases bilaterally to auscultation without wheezes, rales, or rhonchi. Breathing is unlabored. Heart: RRR with S1 S2. No murmurs, rubs, or gallops appreciated. Abdomen: Soft, non-tender, non-distended with normoactive bowel sounds. No rebound/guarding. No obvious abdominal masses. M-S:  Strength and tone appear normal for age. Lower extremities:without edema or ischemic changes, no open wounds  Neuro: Alert and oriented X 3. Moves all extremities spontaneously. Psych:  Responds to questions appropriately with a normal affect. Dialysis Access: R AVF +bruit  Dialysis Orders:  MWF @ [facility] 4 hr  71.5kgs 2K/2.25ca   400/1.5   3000 Heparin   R AVF hectorol 27mcg IV/HD   Aranesp 100   Venofer- none   Assessment/Plan: 1.  Recurrent PNA- tmax 100.4 LML/LLL consolidation. blood cultures drawn 11/16 at outpt center and in ED- pending. Vanc and fortaz given outpt. Vanc and cefepime started. ID to follow. Sputum and urine cx pending 2.  ESRD -  MWF @ [facility]. K+ 3.5. HD tomorrow.  3.  Hypertension/volume  - 132/74. Home coreg on hold 4.  Anemia  - hgb 8.1 cont Aranesp 100. No Fe, last tsat 29 5.  Metabolic bone disease -  Ca+ 8.1 cont hectorol and phoslo. Last PTH 215. 6.  Nutrition - alb 2.2. Renal diet. multivit 7. DM- per primary 8. Heart transplant- imuran, Rapamune, prednisone  Shelle Iron, NP [employer] [phone] 04/12/2014, 10:26 AM   Pt seen, examined and agree w A/P as above. ESRD patient with hx failed renal transplant, active heart transplant on IS medication, and takes HD on MWF, readmitted for recurrent fevers with persistent pulm infiltrates. Antibiotics resumed, primary team evaluating.  Stable from renal standpoint, no vol excess, lytes stable. Plan HD tomorrow Kelly Splinter MD pager [phone]    cell  [phone] 04/12/2014, 1:12 PM

## 2014-04-12 NOTE — Consult Note (Signed)
[facility] for Infectious Disease  Total days of antibiotics 2        Day 2 vanco        Day 2 cefepime               Reason for Consult: FUO in immunocompromised host    Referring Physician: Conley Canal  Active Problems:   HCAP (healthcare-associated pneumonia)   Healthcare associated bacterial pneumonia    HPI: Brandon Sharp is a 68 y.o. 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 Past hx of treatment with voriconazole for invasive pulmonary aspergillus. He and his wife report that he has been feeling poorly over the last 6 wks with symptoms of fever and cough treated with azithromycin in HD, then intermittent fever, malaise, and encephalopathy He has had repeated admission in the last month for fever, possible HCAP.   The patient had been admitted on November 1-5th with symptoms of a weakness x 2 weeks as well as nausea vomiting and a fever of 102. He had blood cultures taken and a chest x-ray performed which it showed a few patchy opacities of the left lower lobe. he was treated with vancomycin and cefepime from November 1 through November 5, but still had intermittent fevers while on antibiotics up to 101.9 and 102.1 on November 4 but wife remembers patient being without food for 2 meals, dialyzed ? hypoglycemic. He discharged on 11/5 to receive 1 addn week of over to  vancomycin and Ceftaz at hemodialysis for LLL PNA.   He was readmitted from 11/11-11/15, Patient states that he did not feel well again, with complain of severe fatigue particularly during dialysis along with subjective chills. He was brought to the ED since his wife found him acute altered, did not know how to call his wife, outside of house unable to go inside, requiring neighbor to assist him. He recalls not eating his breakfast that morning, going to dialysis and terminating early due to feeling poorly ?  Hypoglycemia, drove home ? Then was found altered by his neighbor. He was febrile when she saw him thus brought in on the 11/11 for evaluation. He was admitted started on vancomycin and cefepime again. He had ID consultation for FUO work -up,    Past Medical History  Diagnosis Date  . Diabetes mellitus   . Hypertension   . Myocardial infarction 1985; 1990  . Angina   . Coronary artery disease   . Dysrhythmia   . DVT (deep venous thrombosis) ~ 12/2010    LLE  . Anemia   . Blood transfusion 06/1999    post heart transplant  . Peripheral vascular disease   . CHF (congestive heart failure) 2001    beffore transplant  . Renal failure     Hemodialysis MWF last 2 years, [facility], [facility]  . Renal insufficiency     Allergies:  Allergies  Allergen Reactions  . Lisinopril Swelling    Lips and tongue swell  . Niacin And Related Other (See Comments)    unknown  . Norvasc [Amlodipine Besylate] Rash    Flushing  . Penicillins Rash    MEDICATIONS: . aspirin EC  81 mg Oral Daily  . atorvastatin  40 mg Oral Daily  . azaTHIOprine  75 mg Oral q morning - 10a  . [START ON 04/13/2014] ceFEPime (  MAXIPIME) IV  2 g Intravenous Q M,W,F-1800  . colesevelam  3,750 mg Oral Daily  . [START ON 04/13/2014] darbepoetin (ARANESP) injection - DIALYSIS  100 mcg Intravenous Q Wed-HD  . [START ON 04/13/2014] doxercalciferol  2 mcg Intravenous Q M,W,F-HD  . famotidine  10 mg Oral QHS  . heparin  5,000 Units Subcutaneous 3 times per day  . insulin aspart  0-15 Units Subcutaneous 6 times per day  . insulin glargine  20 Units Subcutaneous q morning - 10a  . isosorbide dinitrate  40 mg Oral TID  . multivitamin  1 tablet Oral QHS  . predniSONE  50 mg Oral Q breakfast  . sirolimus  2 mg Oral Daily  . [START ON 04/13/2014] vancomycin  750 mg Intravenous Q M,W,F-HD    History  Substance Use Topics  . Smoking status: Former Smoker -- 1.00 packs/day for 20 years     Types: Cigarettes    Quit date: 05/27/1988  . Smokeless tobacco: Never Used  . Alcohol Use: Yes     Comment: "Holidays"    Family History  Problem Relation Age of Onset  . Heart disease Mother   . Hyperlipidemia Mother   . Hypertension Mother   . Hyperlipidemia Father   . Hypertension Father   . Diabetes Brother   . Heart disease Brother     Heart Disease before age 24  . Hyperlipidemia Brother   . Hypertension Brother    Review of Systems  Constitutional: positive for fever, chills, diaphoresis, activity change, appetite change, fatigue and unexpected weight change.  HENT: Negative for congestion, sore throat, rhinorrhea, sneezing, trouble swallowing and sinus pressure.  Eyes: Negative for photophobia and visual disturbance.  Respiratory: Negative for cough, chest tightness, shortness of breath, wheezing and stridor.  Cardiovascular: Negative for chest pain, palpitations and leg swelling.  Gastrointestinal: Negative for nausea, vomiting, abdominal pain, diarrhea, constipation, blood in stool, abdominal distention and anal bleeding.  Genitourinary: Negative for dysuria, hematuria, flank pain and difficulty urinating.  Musculoskeletal: Negative for myalgias, back pain, joint swelling, arthralgias and gait problem.  Skin: Negative for color change, pallor, rash and wound.  Neurological: Negative for dizziness, tremors, weakness and light-headedness.  Hematological: Negative for adenopathy. Does not bruise/bleed easily.  Psychiatric/Behavioral: Negative for behavioral problems, confusion, sleep disturbance, dysphoric mood, decreased concentration and agitation.      OBJECTIVE: Temp:  [99.2 F (37.3 C)-100.4 F (38 C)] 100.4 F (38 C) (11/17 0927) Pulse Rate:  [95-119] 101 (11/17 0927) Resp:  [12-22] 18 (11/17 0927) BP: (110-141)/(47-99) 132/74 mmHg (11/17 0927) SpO2:  [77 %-100 %] 97 % (11/17 0927) Weight:  [159 lb 4 oz (72.235 kg)] 159 lb 4 oz (72.235 kg) (11/16  2328) Physical Exam  Constitutional: He is oriented to person, place, and time. He appears well-developed and well-nourished. No distress.  HENT:  Mouth/Throat: Oropharynx is clear and moist. No oropharyngeal exudate.  Cardiovascular: Normal rate, regular rhythm and normal heart sounds. Exam reveals no gallop and no friction rub.  No murmur heard.  Pulmonary/Chest: Effort normal and breath sounds normal. No respiratory distress. He has no wheezes.  Abdominal: Soft. Bowel sounds are normal. He exhibits no distension. There is no tenderness.  Lymphadenopathy:  He has no cervical adenopathy.  Neurological: He is alert and oriented to person, place, and time.  Skin: Skin is warm and dry. No rash noted. No erythema.  Psychiatric: He has a normal mood and affect. His behavior is normal.    LABS: Results for orders  placed or performed during the hospital encounter of 04/11/14 (from the past 48 hour(s))  I-Stat CG4 Lactic Acid, ED     Status: None   Collection Time: 04/11/14  8:45 PM  Result Value Ref Range   Lactic Acid, Venous 2.04 0.5 - 2.2 mmol/L  CBC WITH DIFFERENTIAL     Status: Abnormal   Collection Time: 04/11/14  9:08 PM  Result Value Ref Range   WBC 5.6 4.0 - 10.5 K/uL   RBC 2.83 (L) 4.22 - 5.81 MIL/uL   Hemoglobin 8.6 (L) 13.0 - 17.0 g/dL   HCT 27.1 (L) 39.0 - 52.0 %   MCV 95.8 78.0 - 100.0 fL   MCH 30.4 26.0 - 34.0 pg   MCHC 31.7 30.0 - 36.0 g/dL   RDW 15.0 11.5 - 15.5 %   Platelets 140 (L) 150 - 400 K/uL   Neutrophils Relative % 66 43 - 77 %   Neutro Abs 3.7 1.7 - 7.7 K/uL   Lymphocytes Relative 20 12 - 46 %   Lymphs Abs 1.1 0.7 - 4.0 K/uL   Monocytes Relative 14 (H) 3 - 12 %   Monocytes Absolute 0.8 0.1 - 1.0 K/uL   Eosinophils Relative 0 0 - 5 %   Eosinophils Absolute 0.0 0.0 - 0.7 K/uL   Basophils Relative 0 0 - 1 %   Basophils Absolute 0.0 0.0 - 0.1 K/uL  Comprehensive metabolic panel     Status: Abnormal   Collection Time: 04/11/14  9:08 PM  Result Value Ref  Range   Sodium 137 137 - 147 mEq/L   Potassium 3.3 (L) 3.7 - 5.3 mEq/L   Chloride 93 (L) 96 - 112 mEq/L   CO2 27 19 - 32 mEq/L   Glucose, Bld 62 (L) 70 - 99 mg/dL   BUN 16 6 - 23 mg/dL   Creatinine, Ser 3.84 (H) 0.50 - 1.35 mg/dL   Calcium 8.7 8.4 - 10.5 mg/dL   Total Protein 6.4 6.0 - 8.3 g/dL   Albumin 2.3 (L) 3.5 - 5.2 g/dL   AST 31 0 - 37 U/L   ALT 21 0 - 53 U/L   Alkaline Phosphatase 54 39 - 117 U/L   Total Bilirubin 0.3 0.3 - 1.2 mg/dL   GFR calc non Af Amer 15 (L) >90 mL/min   GFR calc Af Amer 17 (L) >90 mL/min    Comment: (NOTE) The eGFR has been calculated using the CKD EPI equation. This calculation has not been validated in all clinical situations. eGFR's persistently <90 mL/min signify possible Chronic Kidney Disease.    Anion gap 17 (H) 5 - 15  I-Stat arterial blood gas, ED     Status: Abnormal   Collection Time: 04/11/14 11:00 PM  Result Value Ref Range   pH, Arterial 7.636 (HH) 7.350 - 7.450   pCO2 arterial 28.6 (L) 35.0 - 45.0 mmHg   pO2, Arterial 69.0 (L) 80.0 - 100.0 mmHg   Bicarbonate 30.3 (H) 20.0 - 24.0 mEq/L   TCO2 31 0 - 100 mmol/L   O2 Saturation 96.0 %   Acid-Base Excess 9.0 (H) 0.0 - 2.0 mmol/L   Patient temperature 100.1 F    Collection site RADIAL, ALLEN'S TEST ACCEPTABLE    Drawn by RT    Sample type ARTERIAL    Comment NOTIFIED PHYSICIAN   Glucose, capillary     Status: None   Collection Time: 04/11/14 11:26 PM  Result Value Ref Range   Glucose-Capillary 81 70 - 99 mg/dL  CBC WITH DIFFERENTIAL     Status: Abnormal   Collection Time: 04/12/14  1:05 AM  Result Value Ref Range   WBC 4.9 4.0 - 10.5 K/uL   RBC 2.67 (L) 4.22 - 5.81 MIL/uL   Hemoglobin 8.1 (L) 13.0 - 17.0 g/dL   HCT 25.7 (L) 39.0 - 52.0 %   MCV 96.3 78.0 - 100.0 fL   MCH 30.3 26.0 - 34.0 pg   MCHC 31.5 30.0 - 36.0 g/dL   RDW 15.1 11.5 - 15.5 %   Platelets 127 (L) 150 - 400 K/uL   Neutrophils Relative % 63 43 - 77 %   Neutro Abs 3.1 1.7 - 7.7 K/uL   Lymphocytes Relative  22 12 - 46 %   Lymphs Abs 1.1 0.7 - 4.0 K/uL   Monocytes Relative 14 (H) 3 - 12 %   Monocytes Absolute 0.7 0.1 - 1.0 K/uL   Eosinophils Relative 1 0 - 5 %   Eosinophils Absolute 0.0 0.0 - 0.7 K/uL   Basophils Relative 0 0 - 1 %   Basophils Absolute 0.0 0.0 - 0.1 K/uL  Comprehensive metabolic panel     Status: Abnormal   Collection Time: 04/12/14  1:05 AM  Result Value Ref Range   Sodium 140 137 - 147 mEq/L   Potassium 3.5 (L) 3.7 - 5.3 mEq/L   Chloride 96 96 - 112 mEq/L   CO2 28 19 - 32 mEq/L   Glucose, Bld 88 70 - 99 mg/dL   BUN 20 6 - 23 mg/dL   Creatinine, Ser 4.31 (H) 0.50 - 1.35 mg/dL   Calcium 8.1 (L) 8.4 - 10.5 mg/dL   Total Protein 5.7 (L) 6.0 - 8.3 g/dL   Albumin 2.2 (L) 3.5 - 5.2 g/dL   AST 26 0 - 37 U/L   ALT 18 0 - 53 U/L   Alkaline Phosphatase 48 39 - 117 U/L   Total Bilirubin 0.3 0.3 - 1.2 mg/dL   GFR calc non Af Amer 13 (L) >90 mL/min   GFR calc Af Amer 15 (L) >90 mL/min    Comment: (NOTE) The eGFR has been calculated using the CKD EPI equation. This calculation has not been validated in all clinical situations. eGFR's persistently <90 mL/min signify possible Chronic Kidney Disease.    Anion gap 16 (H) 5 - 15  Hemoglobin A1c     Status: Abnormal   Collection Time: 04/12/14  1:05 AM  Result Value Ref Range   Hgb A1c MFr Bld 7.2 (H) <5.7 %    Comment: (NOTE)                                                                       According to the ADA Clinical Practice Recommendations for 2011, when HbA1c is used as a screening test:  >=6.5%   Diagnostic of Diabetes Mellitus           (if abnormal result is confirmed) 5.7-6.4%   Increased risk of developing Diabetes Mellitus References:Diagnosis and Classification of Diabetes Mellitus,Diabetes QAST,4196,22(WLNLG 1):S62-S69 and Standards of Medical Care in         Diabetes - 2011,Diabetes Care,2011,34 (Suppl 1):S11-S61.    Mean Plasma Glucose 160 (H) <117 mg/dL    Comment: Performed  at [facility]   HIV antibody     Status: None   Collection Time: 04/12/14  1:05 AM  Result Value Ref Range   HIV 1&2 Ab, 4th Generation NONREACTIVE NONREACTIVE    Comment: (NOTE) A NONREACTIVE HIV Ag/Ab result does not exclude HIV infection since the time frame for seroconversion is variable. If acute HIV infection is suspected, a HIV-1 RNA Qualitative TMA test is recommended. HIV-1/2 Antibody Diff         Not indicated. HIV-1 RNA, Qual TMA           Not indicated. PLEASE NOTE: This information has been disclosed to you from records whose confidentiality may be protected by state law. If your state requires such protection, then the state law prohibits you from making any further disclosure of the information without the specific written consent of the person to whom it pertains, or as otherwise permitted by law. A general authorization for the release of medical or other information is NOT sufficient for this purpose. The performance of this assay has not been clinically validated in patients less than 68 years old. Performed at [facility] b natriuretic peptide     Status: Abnormal   Collection Time: 04/12/14  1:05 AM  Result Value Ref Range   Pro B Natriuretic peptide (BNP) 45045.0 (H) 0 - 125 pg/mL  Glucose, capillary     Status: None   Collection Time: 04/12/14  4:12 AM  Result Value Ref Range   Glucose-Capillary 91 70 - 99 mg/dL  Glucose, capillary     Status: None   Collection Time: 04/12/14  7:44 AM  Result Value Ref Range   Glucose-Capillary 80 70 - 99 mg/dL  Glucose, capillary     Status: Abnormal   Collection Time: 04/12/14 11:17 AM  Result Value Ref Range   Glucose-Capillary 138 (H) 70 - 99 mg/dL    MICRO: 11/16 blood cx ngtd IMAGING: Dg Chest Port 1 View  04/11/2014   CLINICAL DATA:  Fever and chills.  EXAM: PORTABLE CHEST - 1 VIEW  COMPARISON:  Chest CT, 04/07/2014.  Chest radiographs, 04/06/2014.  FINDINGS: Patchy areas of left mid and lower lung  consolidation and milder right lung base consolidation, this similar to the prior studies allowing for differences in technique and inspiratory volumes. No new areas of lung consolidation. No convincing pulmonary edema. No pleural effusion or pneumothorax.  Changes from cardiac surgery are stable. Cardiac silhouette is mildly enlarged. No mediastinal or hilar masses. Stable right subclavian vein stent.  IMPRESSION: Persist consolidation in the left mid and lower lung and in the right lung base similar to the prior studies. This is most suggestive of multifocal pneumonia. No new abnormalities.   Electronically Signed   By: Lajean Manes M.D.   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  Post CABG and coronary artery stenting.  BILATERAL lower lobe and lingular infiltrates question pneumonia.  Cystic disease of the liver and native kidneys with unremarkable transplant kidney at RIGHT iliac fossa.  Colonic diverticulosis.  Nonspecific 2.2 x 1.6 cm diameter low-attenuation lesion within the spleen; this has a peripheral base and fills in with contrast on incomplete delayed imaging.  Differential diagnosis would include  perfusion artifact, hemangioma, and mass lesion.  Remainder of exam unremarkable without identification of an acute inflammatory process.  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 [place] [facility] [phone]

## 2014-04-12 NOTE — Plan of Care (Signed)
Problem: Phase I Progression Outcomes Goal: Dyspnea controlled at rest Outcome: Completed/Met Date Met:  04/12/14

## 2014-04-12 NOTE — Progress Notes (Signed)
Page infectious disease MD, to see if pt. needs to be in a negative pressure room.  Paged MD again ~18:15.  Dr. Baxter Flattery returned the page.  Patient does not need to be in a negative pressure room.   Jillyn Ledger, MBA, BS, RN

## 2014-04-12 NOTE — Progress Notes (Signed)
[facility] PROGRESS NOTE  Brandon Sharp TDH:[id number] DOB: Aug 29, 1945 DOA: 04/11/2014 PCP: [name], MD  Assessment/Plan:  Principal Problem:   Fever of unknown origin (FUO) Active Problems:   Heart transplanted   DM (diabetes mellitus), type 2 with renal complications   HCAP (healthcare-associated pneumonia): treated. Doubt causing continued fever   ESRD on dialysis   Immunosuppressed status   Healthcare associated bacterial pneumonia  Consult ID. Continue current abx for now.   Code Status:  full Family Communication:   Disposition Plan:  home  HPI/Subjective: Feels better. No change in cough. No n/v/d, rash, myalgia  Objective: Filed Vitals:   04/12/14 1836  BP: 109/59  Pulse: 99  Temp: 98 F (36.7 C)  Resp: 17    Intake/Output Summary (Last 24 hours) at 04/12/14 1855 Last data filed at 04/12/14 1700  Gross per 24 hour  Intake    840 ml  Output      0 ml  Net    840 ml   Filed Weights   04/11/14 2328  Weight: 72.235 kg (159 lb 4 oz)    Exam:   General:  Nontoxic, comfortable in chair. A and o  Cardiovascular: RRR without MGR  Respiratory: CTA without MGR  Abdomen: S, nt, nd  Ext: no CCE  Basic Metabolic Panel:  Recent Labs Lab 04/06/14 1900 04/07/14 0300 04/08/14 0326 04/11/14 2108 04/12/14 0105  NA 139  --  131* 137 140  K 3.5*  --  3.3* 3.3* 3.5*  CL 90*  --  87* 93* 96  CO2 28  --  25 27 28   GLUCOSE 75  --  132* 62* 88  BUN 16  --  40* 16 20  CREATININE 3.90* 5.03* 6.95* 3.84* 4.31*  CALCIUM 9.3  --  8.7 8.7 8.1*   Liver Function Tests:  Recent Labs Lab 04/06/14 1855 04/11/14 2108 04/12/14 0105  AST 47* 31 26  ALT 17 21 18   ALKPHOS 62 54 48  BILITOT 0.3 0.3 0.3  PROT 7.1 6.4 5.7*  ALBUMIN 2.6* 2.3* 2.2*   No results for input(s): LIPASE, AMYLASE in the last 168 hours. No results for input(s): AMMONIA in the last 168 hours. CBC:  Recent Labs Lab 04/07/14 0300 04/08/14 0326 04/10/14 0840  04/11/14 2108 04/12/14 0105  WBC 4.9 3.7* 3.4* 5.6 4.9  NEUTROABS  --   --   --  3.7 3.1  HGB 8.8* 8.0* 8.9* 8.6* 8.1*  HCT 28.1* 24.2* 27.7* 27.1* 25.7*  MCV 99.3 94.5 99.3 95.8 96.3  PLT 109* 98* 138* 140* 127*   Cardiac Enzymes:  Recent Labs Lab 04/06/14 1855  TROPONINI <0.30   BNP (last 3 results)  Recent Labs  03/27/14 1747 04/12/14 0105  PROBNP 37853.0* 45045.0*   CBG:  Recent Labs Lab 04/11/14 2326 04/12/14 0412 04/12/14 0744 04/12/14 1117 04/12/14 1705  GLUCAP 81 91 80 138* 261*    Recent Results (from the past 240 hour(s))  Blood culture (routine x 2)     Status: None (Preliminary result)   Collection Time: 04/06/14  6:55 PM  Result Value Ref Range Status   Specimen Description BLOOD RIGHT ARM  Final   Special Requests BOTTLES DRAWN AEROBIC AND ANAEROBIC 5ML  Final   Culture  Setup Time   Final    04/07/2014 01:07 Performed at [facility]    Culture   Final           BLOOD CULTURE RECEIVED NO GROWTH TO DATE CULTURE  WILL BE HELD FOR 5 DAYS BEFORE ISSUING A FINAL NEGATIVE REPORT Performed at [facility]    Report Status PENDING  Incomplete  Blood culture (routine x 2)     Status: None (Preliminary result)   Collection Time: 04/06/14  7:12 PM  Result Value Ref Range Status   Specimen Description BLOOD RIGHT ARM  Final   Special Requests BOTTLES DRAWN AEROBIC AND ANAEROBIC 5ML  Final   Culture  Setup Time   Final    04/07/2014 01:07 Performed at [facility]    Culture   Final           BLOOD CULTURE RECEIVED NO GROWTH TO DATE CULTURE WILL BE HELD FOR 5 DAYS BEFORE ISSUING A FINAL NEGATIVE REPORT Performed at [facility]    Report Status PENDING  Incomplete  MRSA PCR Screening     Status: None   Collection Time: 04/07/14  7:02 AM  Result Value Ref Range Status   MRSA by PCR NEGATIVE NEGATIVE Final    Comment:        The GeneXpert MRSA Assay (FDA approved for NASAL specimens only), is one component of  a comprehensive MRSA colonization surveillance program. It is not intended to diagnose MRSA infection nor to guide or monitor treatment for MRSA infections.      Studies: Dg Chest Port 1 View  04/11/2014   CLINICAL DATA:  Fever and chills.  EXAM: PORTABLE CHEST - 1 VIEW  COMPARISON:  Chest CT, 04/07/2014.  Chest radiographs, 04/06/2014.  FINDINGS: Patchy areas of left mid and lower lung consolidation and milder right lung base consolidation, this similar to the prior studies allowing for differences in technique and inspiratory volumes. No new areas of lung consolidation. No convincing pulmonary edema. No pleural effusion or pneumothorax.  Changes from cardiac surgery are stable. Cardiac silhouette is mildly enlarged. No mediastinal or hilar masses. Stable right subclavian vein stent.  IMPRESSION: Persist consolidation in the left mid and lower lung and in the right lung base similar to the prior studies. This is most suggestive of multifocal pneumonia. No new abnormalities.   Electronically Signed   By: Lajean Manes M.D.   On: 04/11/2014 20:46    Scheduled Meds: . aspirin EC  81 mg Oral Daily  . atorvastatin  40 mg Oral Daily  . azaTHIOprine  75 mg Oral q morning - 10a  . [START ON 04/13/2014] ceFEPime (MAXIPIME) IV  2 g Intravenous Q M,W,F-1800  . colesevelam  3,750 mg Oral Daily  . [START ON 04/13/2014] darbepoetin (ARANESP) injection - DIALYSIS  100 mcg Intravenous Q Wed-HD  . [START ON 04/13/2014] doxercalciferol  2 mcg Intravenous Q M,W,F-HD  . famotidine  10 mg Oral QHS  . heparin  5,000 Units Subcutaneous 3 times per day  . insulin aspart  0-15 Units Subcutaneous 6 times per day  . insulin glargine  20 Units Subcutaneous q morning - 10a  . isosorbide dinitrate  40 mg Oral TID  . multivitamin  1 tablet Oral QHS  . predniSONE  50 mg Oral Q breakfast  . sirolimus  2 mg Oral Daily  . [START ON 04/13/2014] vancomycin  750 mg Intravenous Q M,W,F-HD   Continuous Infusions:    Time spent: 25 minutes  [facility] Pager [phone]. If 7PM-7AM, please contact night-coverage at [email or web address], password [facility] 04/12/2014, 6:55 PM  LOS: 1 day

## 2014-04-12 NOTE — Plan of Care (Signed)
Problem: Phase I Progression Outcomes Goal: First antibiotic given within 6hrs of admit Outcome: Completed/Met Date Met:  04/12/14 Goal: Confirm chest x-ray completed Outcome: Completed/Met Date Met:  04/12/14

## 2014-04-13 ENCOUNTER — Encounter (HOSPITAL_COMMUNITY): Payer: Self-pay

## 2014-04-13 ENCOUNTER — Inpatient Hospital Stay (HOSPITAL_COMMUNITY): Payer: Medicare Other

## 2014-04-13 DIAGNOSIS — E1122 Type 2 diabetes mellitus with diabetic chronic kidney disease: Secondary | ICD-10-CM

## 2014-04-13 DIAGNOSIS — N189 Chronic kidney disease, unspecified: Secondary | ICD-10-CM

## 2014-04-13 LAB — CBC
HCT: 20.9 % — ABNORMAL LOW (ref 39.0–52.0)
Hemoglobin: 6.8 g/dL — CL (ref 13.0–17.0)
MCH: 30.6 pg (ref 26.0–34.0)
MCHC: 32.5 g/dL (ref 30.0–36.0)
MCV: 94.1 fL (ref 78.0–100.0)
Platelets: 128 K/uL — ABNORMAL LOW (ref 150–400)
RBC: 2.22 MIL/uL — ABNORMAL LOW (ref 4.22–5.81)
RDW: 14.8 % (ref 11.5–15.5)
WBC: 3.7 K/uL — ABNORMAL LOW (ref 4.0–10.5)

## 2014-04-13 LAB — CULTURE, BLOOD (ROUTINE X 2)
Culture: NO GROWTH
Culture: NO GROWTH

## 2014-04-13 LAB — RENAL FUNCTION PANEL
Albumin: 2.1 g/dL — ABNORMAL LOW (ref 3.5–5.2)
Anion gap: 19 — ABNORMAL HIGH (ref 5–15)
BUN: 44 mg/dL — ABNORMAL HIGH (ref 6–23)
CO2: 23 meq/L (ref 19–32)
Calcium: 8.6 mg/dL (ref 8.4–10.5)
Chloride: 95 meq/L — ABNORMAL LOW (ref 96–112)
Creatinine, Ser: 7.08 mg/dL — ABNORMAL HIGH (ref 0.50–1.35)
GFR calc Af Amer: 8 mL/min — ABNORMAL LOW
GFR calc non Af Amer: 7 mL/min — ABNORMAL LOW
Glucose, Bld: 120 mg/dL — ABNORMAL HIGH (ref 70–99)
Phosphorus: 4.4 mg/dL (ref 2.3–4.6)
Potassium: 3.7 meq/L (ref 3.7–5.3)
Sodium: 137 meq/L (ref 137–147)

## 2014-04-13 LAB — GLUCOSE, CAPILLARY
GLUCOSE-CAPILLARY: 210 mg/dL — AB (ref 70–99)
Glucose-Capillary: 159 mg/dL — ABNORMAL HIGH (ref 70–99)
Glucose-Capillary: 107 mg/dL — ABNORMAL HIGH (ref 70–99)
Glucose-Capillary: 143 mg/dL — ABNORMAL HIGH (ref 70–99)
Glucose-Capillary: 210 mg/dL — ABNORMAL HIGH (ref 70–99)

## 2014-04-13 LAB — ANA: ANA: NEGATIVE

## 2014-04-13 LAB — RHEUMATOID FACTOR: RHEUMATOID FACTOR: 17 [IU]/mL — AB (ref <=14)

## 2014-04-13 LAB — ABO/RH: ABO/RH(D): O POS

## 2014-04-13 LAB — PREPARE RBC (CROSSMATCH)

## 2014-04-13 LAB — C-REACTIVE PROTEIN: CRP: 18.3 mg/dL — ABNORMAL HIGH

## 2014-04-13 MED ORDER — DARBEPOETIN ALFA 100 MCG/0.5ML IJ SOSY
PREFILLED_SYRINGE | INTRAMUSCULAR | Status: AC
  Filled 2014-04-13: qty 0.5

## 2014-04-13 MED ORDER — SODIUM CHLORIDE 0.9 % IV SOLN: Freq: Once | INTRAVENOUS | Status: DC

## 2014-04-13 MED ORDER — DIPHENHYDRAMINE HCL 25 MG PO CAPS
25.0000 mg | ORAL_CAPSULE | Freq: Once | ORAL | Status: AC
  Administered 2014-04-13: 25 mg via ORAL

## 2014-04-13 MED ORDER — DOXERCALCIFEROL 4 MCG/2ML IV SOLN
INTRAVENOUS | Status: AC
  Filled 2014-04-13: qty 2

## 2014-04-13 MED ORDER — DIPHENHYDRAMINE HCL 25 MG PO CAPS
ORAL_CAPSULE | ORAL | Status: AC
  Filled 2014-04-13: qty 1

## 2014-04-13 MED ORDER — ACETAMINOPHEN 325 MG PO TABS
ORAL_TABLET | ORAL | Status: AC
  Filled 2014-04-13: qty 2

## 2014-04-13 MED ORDER — ACETAMINOPHEN 325 MG PO TABS
650.0000 mg | ORAL_TABLET | Freq: Once | ORAL | Status: AC
  Administered 2014-04-13: 650 mg via ORAL

## 2014-04-13 NOTE — Plan of Care (Signed)
Problem: Phase I Progression Outcomes Goal: OOB as tolerated unless otherwise ordered Outcome: Completed/Met Date Met:  04/13/14 Goal: Code status addressed with pt/family Outcome: Completed/Met Date Met:  04/13/14 Goal: Initial discharge plan identified Outcome: Completed/Met Date Met:  04/13/14 Goal: Voiding-avoid urinary catheter unless indicated Outcome: Completed/Met Date Met:  04/13/14 Goal: Hemodynamically stable Outcome: Completed/Met Date Met:  04/13/14 Goal: Other Phase I Outcomes/Goals Outcome: Not Applicable Date Met:  76/19/50  Problem: Phase II Progression Outcomes Goal: Pain controlled Outcome: Completed/Met Date Met:  04/13/14 Goal: Tolerating diet Outcome: Completed/Met Date Met:  04/13/14  Problem: Phase III Progression Outcomes Goal: O2 sats > or equal to 93% on room air Outcome: Completed/Met Date Met:  04/13/14 Goal: Pain controlled on oral analgesia Outcome: Completed/Met Date Met:  04/13/14 Goal: Tolerating diet Outcome: Completed/Met Date Met:  04/13/14

## 2014-04-13 NOTE — Progress Notes (Signed)
Subjective:   Feeling a little better. Dizzy/lightheaded when he stands up  Objective Filed Vitals:   04/13/14 0830 04/13/14 0900 04/13/14 0930 04/13/14 1000  BP: 111/56 123/59 121/63 131/66  Pulse: 77 75 75 73  Temp:      TempSrc:      Resp: 13 12 13 14   Height:      Weight:      SpO2:       Physical Exam General: alert and oriented, no acute distress on HD. Heart: RRR Lungs: CTA, unlabored.  Abdomen: soft, nontender +BS  Extremities: no edema  Dialysis Access: R AVF patent on HD  Dialysis Orders: MWF @ [facility] 4 hr 71.5kgs 2K/2.25ca 400/1.5 3000 Heparin R AVF hectorol 45mcg IV/HD Aranesp 100 Venofer- none  Assessment/Plan: 1. Recurrent PNA / pulm infiltrates / fever- immunocompromised. Afebrile x24 hours. LML/LLL nodular infiltrates. blood cultures drawn 11/16 at outpt center and in ED- no growth. Vanc and cefepime.  ID to follow. Sputum and urine cx pending 2. ESRD - MWF @ [facility]. K+ 3.7. HD today 3. Hypertension/volume - 115/59. Home coreg  4. Anemia - hgb 8.6-> 8.1->6.8 cont Aranesp 100. Transfuse 2u RBC today. Stool cards pending. No Fe, last tsat 29 5. Metabolic bone disease - Ca+ 8.6 cont hectorol and phoslo. Last PTH 215. 6. Nutrition - alb 2.1. Renal diet. multivit 7. DM- per primary 8. Heart transplant - imuran, Rapamune, prednisone  Brandon Iron, NP [place] [phone] 04/13/2014,10:16 AM  LOS: 2 days   Pt seen, examined and agree w A/P as above.  Kelly Splinter MD pager [phone]    cell [phone] 04/13/2014, 11:23 AM    Additional Objective Labs: Basic Metabolic Panel:  Recent Labs Lab 04/11/14 2108 04/12/14 0105 04/13/14 0720  NA 137 140 137  K 3.3* 3.5* 3.7  CL 93* 96 95*  CO2 27 28 23   GLUCOSE 62* 88 120*  BUN 16 20 44*  CREATININE 3.84* 4.31* 7.08*  CALCIUM 8.7 8.1* 8.6  PHOS  --   --  4.4   Liver Function Tests:  Recent Labs Lab 04/06/14 1855 04/11/14 2108 04/12/14 0105 04/13/14 0720  AST 47*  31 26  --   ALT 17 21 18   --   ALKPHOS 62 54 48  --   BILITOT 0.3 0.3 0.3  --   PROT 7.1 6.4 5.7*  --   ALBUMIN 2.6* 2.3* 2.2* 2.1*   No results for input(s): LIPASE, AMYLASE in the last 168 hours. CBC:  Recent Labs Lab 04/08/14 0326 04/10/14 0840 04/11/14 2108 04/12/14 0105 04/13/14 0720  WBC 3.7* 3.4* 5.6 4.9 3.7*  NEUTROABS  --   --  3.7 3.1  --   HGB 8.0* 8.9* 8.6* 8.1* 6.8*  HCT 24.2* 27.7* 27.1* 25.7* 20.9*  MCV 94.5 99.3 95.8 96.3 94.1  PLT 98* 138* 140* 127* 128*   Blood Culture    Component Value Date/Time   SDES BLOOD PORTA CATH 04/11/2014 2107   SPECREQUEST BOTTLES DRAWN AEROBIC ONLY 3CC 04/11/2014 2107   CULT  04/11/2014 2107           BLOOD CULTURE RECEIVED NO GROWTH TO DATE CULTURE WILL BE HELD FOR 5 DAYS BEFORE ISSUING A FINAL NEGATIVE REPORT Performed at [facility] PENDING 04/11/2014 2107    Cardiac Enzymes:  Recent Labs Lab 04/06/14 1855  TROPONINI <0.30   CBG:  Recent Labs Lab 04/12/14 1117 04/12/14 1705 04/12/14 1955 04/13/14 0005 04/13/14 0421  GLUCAP  138* 261* 318* 210* 159*   Sharp Studies: No results for input(s): Sharp, TIBC, TRANSFERRIN, FERRITIN in the last 72 hours. @lablastinr3 @  Medications:   . sodium chloride   Intravenous Once  . acetaminophen      . aspirin EC  81 mg Oral Daily  . atorvastatin  40 mg Oral Daily  . azaTHIOprine  75 mg Oral q morning - 10a  . calcium acetate  667 mg Oral TID WC  . carvedilol  6.25 mg Oral QHS  . ceFEPime (MAXIPIME) IV  2 g Intravenous Q M,W,F-1800  . colesevelam  3,750 mg Oral Daily  . Darbepoetin Alfa      . darbepoetin (ARANESP) injection - DIALYSIS  100 mcg Intravenous Q Wed-HD  . diphenhydrAMINE      . doxercalciferol      . doxercalciferol  2 mcg Intravenous Q M,W,F-HD  . famotidine  10 mg Oral QHS  . heparin  5,000 Units Subcutaneous 3 times per day  . insulin aspart  0-15 Units Subcutaneous 6 times per day  . insulin glargine  20 Units Subcutaneous  q morning - 10a  . isosorbide dinitrate  40 mg Oral TID  . multivitamin  1 tablet Oral QHS  . predniSONE  10 mg Oral Q breakfast  . sirolimus  2 mg Oral Daily  . vancomycin  750 mg Intravenous Q M,W,F-HD

## 2014-04-13 NOTE — Plan of Care (Signed)
Problem: Phase II Progression Outcomes Goal: Wean O2 if indicated Outcome: Not Applicable Date Met:  10/40/45

## 2014-04-13 NOTE — Progress Notes (Signed)
Inpatient Diabetes Program Recommendations  AACE/ADA: New Consensus Statement on Inpatient Glycemic Control (2013)  Target Ranges:  Prepandial:   less than 140 mg/dL      Peak postprandial:   less than 180 mg/dL (1-2 hours)      Critically ill patients:  140 - 180 mg/dL   Results for Brandon Sharp, Brandon Sharp (MRN [id number]) as of 04/13/2014 13:13  Ref. Range 04/12/2014 04:12 04/12/2014 07:44 04/12/2014 11:17 04/12/2014 17:05 04/12/2014 19:55 04/13/2014 00:05 04/13/2014 04:21  Glucose-Capillary Latest Range: 70-99 mg/dL 91 80 138 (H) 261 (H) 318 (H) 210 (H) 159 (H)   Outpatient Diabetes medications: Lantus 20 units QAM, Humalog 6-10 units BID with meals Current orders for Inpatient glycemic control: Lantus 20 units QAM, Novolog 0-15 units Q4H  Inpatient Diabetes Program Recommendations Correction (SSI): In reviewing chart, noted patient refused Novolog correction at 00:40 today. NURSING: please encourage patient to take insulin as prescribed. If patient is eating, MD may want to consider changing frequency of CBGs and Novolog to ACHS. Insulin - Meal Coverage: Please consider ordering Novolog 4 units TID with meals for meal coverage if patient is eating at least 50% of meal.   Thanks, Barnie Alderman, RN, MSN, CCRN, CDE Diabetes Coordinator Inpatient Diabetes Program [phone] (Team Pager) [phone] ([facility] office) [phone] [facility] office)

## 2014-04-13 NOTE — Consult Note (Signed)
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  Has had multiple admits in last 6 months for fever and presumed HCAP.  Work-up has been negative thus far and PCCM called to assist and ?FOB.    SIGNIFICANT EVENTS  07/24/13 - 07/29/13: Admit for HCAP/Influenza A - dc with doxy and flagyl 03/27/14 - 03/30/14: Admit for fever, sepsis, LLL HCAP and ESRD on dialysis - d/c with doxycycline and ceftin 04/06/14 - 04/10/14 : AMS, weakness, fever of unknown origin, LLL HCAP - Abx d/c and increased prednisone for AI 04/11/14: Admitted for fever of unknown origin in immunocompromised pt, recurrent/persistant HCAP  STUDIES:  CT chest 11/18>>> persistent bilat LL consolidation but overall improved lung volumes.     HISTORY OF PRESENT ILLNESS:  Brandon Sharp is a 68 y/o male with past medical history significant for heart transplant (2001) with CAD and s/p CABG, failed kidney transplant for ESRD, currently on HD MWF, insulin-dependent DM, HTN, CHF, and chronic immunosuppressive meds for transplants including prednisone. Pt has been admitted several times in the past 6 months, with a noted history of LLL infiltrate/HCAP dating back to march 2015. Most recent admissions are 11/1 - 11/5 for HCAP, fever, weakness, nausea and vomiting, 11/11 - 11/15 for HCAP, fevers, fatigue and acute encephalopathy, and pt returned to ED 11/16 with increased fever after admission. ID has consulted on past and current admissions and to date, all work-ups have had negative results for flu, Hep A, B, C, crypto Ag, HIV, RPR, currently pending is urine strep Ag, Urine legionella, AFB, sputum culture, fungal culture and BC x2.    Per pt - biggest  c/o has been fatigue, weakness and fever.  Denies SOB, chest pain, cough, purulent sputum, hemoptysis.  Former smoker - quit 25 years ago.  Worked as Environmental education officer.    PAST MEDICAL HISTORY :   has a past medical history of Diabetes mellitus; Hypertension; Myocardial infarction (1985; 1990); Angina; Coronary artery disease; Dysrhythmia; DVT (deep venous thrombosis) (~ 12/2010); Anemia; Blood transfusion (06/1999); Peripheral vascular disease; CHF (congestive heart failure) (2001); Renal failure; and Renal insufficiency.  has past surgical history that includes Nephrectomy transplanted organ (2008); AV fistula repair; AV fistula placement (~ 01/2010); Heart transplant (06/1999); Coronary angioplasty with stent (1985; 1990); Colonoscopy (03/17/2012); Coronary artery bypass graft (1990); and Colonoscopy with propofol (N/A, 12/14/2013). Prior to Admission medications   Medication Sig Start Date End Date Taking? Authorizing Provider  aspirin EC 81 MG tablet Take 81 mg by mouth daily.     Yes Historical Provider, MD  atorvastatin (LIPITOR) 40 MG tablet Take 40 mg by mouth daily.     Yes Historical Provider, MD  azaTHIOprine (IMURAN) 50 MG tablet Take 75 mg by mouth every morning.    Yes Historical Provider, MD  b complex-vitamin c-folic acid (NEPHRO-VITE) 0.8 MG TABS tablet Take 1 tablet by mouth at bedtime.   Yes Historical Provider, MD  calcium acetate (PHOSLO) 667 MG capsule Take 667 mg by mouth 3 (three) times daily with meals.  11/01/12  Yes Historical Provider, MD  carvedilol (COREG) 25 MG tablet Take 25 mg by mouth at bedtime.   Yes Historical Provider, MD  colesevelam (WELCHOL) 625 MG tablet Take  3,750 mg by mouth daily. Takes 6 tabs daily   Yes Historical Provider, MD  famotidine (PEPCID AC) 10 MG chewable tablet Chew 10 mg by mouth at bedtime.    Yes Historical Provider, MD  HUMALOG KWIKPEN 100 UNIT/ML SOPN Inject 6-10 Units into the skin 2 (two) times daily. Per sliding scale 11/17/12  Yes Historical  Provider, MD  insulin glargine (LANTUS) 100 UNIT/ML injection Inject 20 Units into the skin every morning.   Yes Historical Provider, MD  isosorbide dinitrate (ISORDIL) 40 MG tablet Take 40 mg by mouth 3 (three) times daily.     Yes Historical Provider, MD  predniSONE (DELTASONE) 1 MG tablet Take 2 mg by mouth daily with breakfast.   Yes Historical Provider, MD  sirolimus (RAPAMUNE) 2 MG tablet Take 2 mg by mouth daily.   Yes Historical Provider, MD   Allergies  Allergen Reactions  . Lisinopril Swelling    Lips and tongue swell  . Niacin And Related Other (See Comments)    unknown  . Norvasc [Amlodipine Besylate] Rash    Flushing  . Penicillins Rash    FAMILY HISTORY:  family history includes Diabetes in his brother; Heart disease in his brother and mother; Hyperlipidemia in his brother, father, and mother; Hypertension in his brother, father, and mother. SOCIAL HISTORY:  reports that he quit smoking about 25 years ago. His smoking use included Cigarettes. He has a 20 pack-year smoking history. He has never used smokeless tobacco. He reports that he drinks alcohol. He reports that he does not use illicit drugs.  REVIEW OF SYSTEMS:   As per HPI - All other systems reviewed and were neg.    SUBJECTIVE:   VITAL SIGNS: Temp:  [97.2 F (36.2 C)-98.9 F (37.2 C)] 97.5 F (36.4 C) (11/18 1144) Pulse Rate:  [72-99] 75 (11/18 1144) Resp:  [12-18] 16 (11/18 1144) BP: (109-171)/(56-78) 171/65 mmHg (11/18 1144) SpO2:  [95 %-100 %] 100 % (11/18 1144) Weight:  [158 lb 4.6 oz (71.8 kg)-161 lb 13.1 oz (73.4 kg)] 158 lb 4.6 oz (71.8 kg) (11/18 1126)  PHYSICAL EXAMINATION: General:  Pleasant male, NAD on RA Neuro:  Awake, alert, appropriate, MAE  HEENT:  Mm moist, no JVD  Cardiovascular:  s1s2 rrr Lungs:  resps even, non labored, few scattered rhonchi L>R  Abdomen:  Soft, +bs Musculoskeletal:  Warm and dry, no edema   Recent Labs Lab 04/11/14 2108 04/12/14 0105 04/13/14 0720  NA  137 140 137  K 3.3* 3.5* 3.7  CL 93* 96 95*  CO2 27 28 23   BUN 16 20 44*  CREATININE 3.84* 4.31* 7.08*  GLUCOSE 62* 88 120*    Recent Labs Lab 04/11/14 2108 04/12/14 0105 04/13/14 0720  HGB 8.6* 8.1* 6.8*  HCT 27.1* 25.7* 20.9*  WBC 5.6 4.9 3.7*  PLT 140* 127* 128*   Ct Chest Wo Contrast  04/13/2014   CLINICAL DATA:  68 year old immunocompromised male with intermittent fever. Initial encounter. Current history of renal failure status post renal transplant and transplant project.  EXAM: CT CHEST WITHOUT CONTRAST  TECHNIQUE: Multidetector CT imaging of the chest was performed following the standard protocol without IV contrast.  COMPARISON:  Chest radiograph 04/11/2014 and earlier. Chest CT 04/07/2014  FINDINGS: Improved lung volumes. Decreased dependent atelectasis in both lungs, however, confluent peribronchovascular opacity persists in both lower lobes in the lingula, with central air bronchograms. See series 3. The right middle lobe is spared. The right upper lobe is spared. The superior aspect of the  left upper lobe is spared.  Only trace associated left pleural fluid. No pericardial effusion. Stable cardiomegaly. No hilar or mediastinal lymphadenopathy. No axillary lymphadenopathy.  Gynecomastia. Stable visualized upper abdominal viscera. Aortic ectasia and calcified atherosclerosis re- identified. Coronary artery stents and atherosclerosis. Sequelae of median sternotomy. Right subclavian venous vascular stent re- identified.  No acute osseous abnormality identified.  IMPRESSION: 1. Improved lung volumes with resolved dependent atelectasis, but persistent confluent bilateral lower lobe and lingula peribronchovascular consolidation. Favor bronchopneumonia in this setting. Noninfectious considerations such as post transplant lymphoproliferative disorder are felt less likely. 2. Trace left pleural fluid. 3. Otherwise stable chest and upper abdominal viscera compared to 04/07/2014.    Electronically Signed   By: Lars Pinks M.D.   On: 04/13/2014 06:00   Dg Chest Port 1 View  04/11/2014   CLINICAL DATA:  Fever and chills.  EXAM: PORTABLE CHEST - 1 VIEW  COMPARISON:  Chest CT, 04/07/2014.  Chest radiographs, 04/06/2014.  FINDINGS: Patchy areas of left mid and lower lung consolidation and milder right lung base consolidation, this similar to the prior studies allowing for differences in technique and inspiratory volumes. No new areas of lung consolidation. No convincing pulmonary edema. No pleural effusion or pneumothorax.  Changes from cardiac surgery are stable. Cardiac silhouette is mildly enlarged. No mediastinal or hilar masses. Stable right subclavian vein stent.  IMPRESSION: Persist consolidation in the left mid and lower lung and in the right lung base similar to the prior studies. This is most suggestive of multifocal pneumonia. No new abnormalities.   Electronically Signed   By: Lajean Manes M.D.   On: 04/11/2014 20:46    ASSESSMENT / PLAN:  Recurrent/Persistent (?) HCAP - persistent infiltrates, fever.  -  Multiple possible etiologies in immunocompromised pt with persistent infiltrates/HCAP including atypical infection, PCP, MAC/MAI, Fungal, TB, etc. Has hx fungal PNA in past.  Previous w/u (by ID) has been negative for HIV, EBV, CMV, RPR, flu, Hepatitis, crytpo. Also need to consider non-infectious causes of persistent infiltrates such as autoimmune (especially in the setting of elevated sed rate with chronic steroid use), pneumonitis and malignancy.   PLAN -  Urine strep, legionella pending ANA, RF pending  Continue current abx  Consider addition full RVP (flu PCR neg)  Plan for FOB 11/19 at 11am for BAL and possible biopsy  NPO after MN May need to consider further w/u for other etiologies - ie ?f/u 2D echo, etc with continued fever and relative lack of respiratory s/s  Nickolas Madrid, NP 04/13/2014  3:55 PM Pager: [phone] or [phone]  Attending  Note I have examined pt and reviewed his films and labs. I agree with K Whiteheart's plans and note as amended above.  He has persistent B LL and lingular infiltrates on 11/18 that have not cleared fully - they are somewhat better than 04/07/14 CT scan. Must consider a post-transplant lymphoproliferative disorder and all atypical infections. I will plan for FOB this am with cx data, TBBx's for pathology.   Baltazar Apo, MD, PhD 04/14/2014, 8:51 AM [facility] [phone] or if no answer [phone]

## 2014-04-13 NOTE — Consult Note (Signed)
Name: Brandon Sharp MRN: [id number] DOB: May 10, 1946    ADMISSION DATE:  04/11/2014 CONSULTATION DATE:  04/13/2014  REFERRING MD :  Conley Canal ([facility]) CHIEF COMPLAINT:  Recurrent/persistent HCAP?  BRIEF PATIENT DESCRIPTION:   68 y/o male with history of heart transplant, CAD with CABG, failed kidney transplant, ESRD on MWF dialysis, and chronic immunosuppression.  Multiple admits since March 2015 with persistent fever, malaise, and progressive weakness with persisting lower lobe infiltrates with negative workups thus far.  PCCM was consulted to assist.  SIGNIFICANT EVENTS:  07/24/13 - 07/29/13:  Admit for HCAP/Influenza A - dc with doxy and flagyl 03/27/14 - 03/30/14:  Admit for fever, sepsis, LLL HCAP and ESRD on dialysis - d/c with doxycycline and ceftin 04/06/14 - 04/10/14:  AMS, weakness, fever of unknown origin, LLL HCAP - Abx d/c and increased prednisone for AI 04/11/14:  Admitted for fever of unknown origin in immunocompromised pt, recurrent/persistant HCAP  STUDIES:   2/28 CXR - bibasilar airspace opacification 3/4 CXR - bibasilar opacification/prominent markings L>R 11/1 CXR - increased patchy density LLL 11/12 CT chest - bilateral lower lobe and lingular infiltrates 11/18 CT chest - improved lung volumes and decreased dependent atelectasis bilaterally with persistent bilateral LL and lingula opacity with central air bronchograms   HISTORY OF PRESENT ILLNESS:    Brandon Sharp is a 68 y/o male with past medical history significant for heart transplant (2001) with CAD and s/p CABG, failed kidney transplant for ESRD, currently on HD MWF, insulin-dependent DM, HTN, CHF, and chronic immunosuppressive meds for transplants including prednisone.  Pt has been admitted several times in the past 6 months, with a noted history of LLL infiltrate/HCAP dating back to march 2015.  Most recent admissions are 11/1 - 11/5 for HCAP, fever, weakness, nausea and vomiting, 11/11 - 11/15 for HCAP,  fevers, fatigue and acute encephalopathy.  ID has consulted on past and current admissions and to date, all work-ups have had negative results, and last discharge antibiotics were discontinued. Pt returned to ED 11/16 with increased fever after discharge and persistent symptoms of malaise, fatigue and weakness, and no associated chest pain, congestion, cough or shortness of breath. Pt was admitted and PCCM was consulted for recurrent HCAP/pulmonary infiltrates.  PAST MEDICAL HISTORY :   has a past medical history of Diabetes mellitus; Hypertension; Myocardial infarction (1985; 1990); Angina; Coronary artery disease; Dysrhythmia; DVT (deep venous thrombosis) (~ 12/2010); Anemia; Blood transfusion (06/1999); Peripheral vascular disease; CHF (congestive heart failure) (2001); Renal failure; and Renal insufficiency.  has past surgical history that includes Nephrectomy transplanted organ (2008); AV fistula repair; AV fistula placement (~ 01/2010); Heart transplant (06/1999); Coronary angioplasty with stent (1985; 1990); Colonoscopy (03/17/2012); Coronary artery bypass graft (1990); and Colonoscopy with propofol (N/A, 12/14/2013). Prior to Admission medications   Medication Sig Start Date End Date Taking? Authorizing Provider  aspirin EC 81 MG tablet Take 81 mg by mouth daily.     Yes Historical Provider, MD  atorvastatin (LIPITOR) 40 MG tablet Take 40 mg by mouth daily.     Yes Historical Provider, MD  azaTHIOprine (IMURAN) 50 MG tablet Take 75 mg by mouth every morning.    Yes Historical Provider, MD  b complex-vitamin c-folic acid (NEPHRO-VITE) 0.8 MG TABS tablet Take 1 tablet by mouth at bedtime.   Yes Historical Provider, MD  calcium acetate (PHOSLO) 667 MG capsule Take 667 mg by mouth 3 (three) times daily with meals.  11/01/12  Yes Historical Provider, MD  carvedilol (COREG) 25 MG tablet Take  25 mg by mouth at bedtime.   Yes Historical Provider, MD  colesevelam (WELCHOL) 625 MG tablet Take 3,750 mg by mouth  daily. Takes 6 tabs daily   Yes Historical Provider, MD  famotidine (PEPCID AC) 10 MG chewable tablet Chew 10 mg by mouth at bedtime.    Yes Historical Provider, MD  HUMALOG KWIKPEN 100 UNIT/ML SOPN Inject 6-10 Units into the skin 2 (two) times daily. Per sliding scale 11/17/12  Yes Historical Provider, MD  insulin glargine (LANTUS) 100 UNIT/ML injection Inject 20 Units into the skin every morning.   Yes Historical Provider, MD  isosorbide dinitrate (ISORDIL) 40 MG tablet Take 40 mg by mouth 3 (three) times daily.     Yes Historical Provider, MD  predniSONE (DELTASONE) 1 MG tablet Take 2 mg by mouth daily with breakfast.   Yes Historical Provider, MD  sirolimus (RAPAMUNE) 2 MG tablet Take 2 mg by mouth daily.   Yes Historical Provider, MD   Allergies  Allergen Reactions  . Lisinopril Swelling    Lips and tongue swell  . Niacin And Related Other (See Comments)    unknown  . Norvasc [Amlodipine Besylate] Rash    Flushing  . Penicillins Rash    FAMILY HISTORY:  family history includes Diabetes in his brother; Heart disease in his brother and mother; Hyperlipidemia in his brother, father, and mother; Hypertension in his brother, father, and mother. SOCIAL HISTORY:  reports that he quit smoking about 25 years ago. His smoking use included Cigarettes. He has a 20 pack-year smoking history. He has never used smokeless tobacco. He reports that he drinks alcohol. He reports that he does not use illicit drugs.  REVIEW OF SYSTEMS:    Constitutional: Fever, malaise, fatigue, anorexia, Negative for chills and diaphoresis.  HENT: Negative for hearing loss, ear pain, nosebleeds, congestion, sore throat, neck pain, tinnitus and ear discharge.   Eyes: Negative for blurred vision, double vision, photophobia, pain, discharge and redness.  Respiratory: Negative for cough, hemoptysis, sputum production, shortness of breath, wheezing and stridor.   Cardiovascular: Negative for chest pain, palpitations,  orthopnea, claudication, leg swelling and PND.  Gastrointestinal: Negative for heartburn, nausea, vomiting, abdominal pain, diarrhea, constipation, blood in stool and melena.  Genitourinary: Negative for dysuria, urgency, frequency, hematuria and flank pain.  Musculoskeletal: Negative for myalgias, back pain, joint pain and falls.  Skin: Negative for itching and rash.  Neurological:  Generalized LE weakness, Negative for dizziness, tingling, tremors, sensory change, speech change, seizures, loss of consciousness, no loss of bladder/bowel function Endo/Heme/Allergies: Negative for environmental allergies and polydipsia. Does not bruise/bleed easily.  SUBJECTIVE:   VITAL SIGNS: Temp:  [97.2 F (36.2 C)-98.9 F (37.2 C)] 97.5 F (36.4 C) (11/18 1144) Pulse Rate:  [72-99] 75 (11/18 1144) Resp:  [12-18] 16 (11/18 1144) BP: (109-171)/(56-78) 171/65 mmHg (11/18 1144) SpO2:  [95 %-100 %] 100 % (11/18 1144) Weight:  [158 lb 4.6 oz (71.8 kg)-161 lb 13.1 oz (73.4 kg)] 158 lb 4.6 oz (71.8 kg) (11/18 1126)  PHYSICAL EXAMINATION: General:  Non-toxic and undernourished male, in nad, comfortable in bed Neuro:  A&O x3, grossly intact HEENT:  NCAT,  Cardiovascular:  RRR Lungs:  Good bs bilaterally with crackles at the bases, no wheeze or rhonchi Abdomen:  Soft, non-tender, non-distended Musculoskeletal:  Bilateral LE atrophy R>L   Skin: warm, dry, no pitting edema   Recent Labs Lab 04/11/14 2108 04/12/14 0105 04/13/14 0720  NA 137 140 137  K 3.3* 3.5* 3.7  CL 93* 96  95*  CO2 27 28 23   BUN 16 20 44*  CREATININE 3.84* 4.31* 7.08*  GLUCOSE 62* 88 120*    Recent Labs Lab 04/11/14 2108 04/12/14 0105 04/13/14 0720  HGB 8.6* 8.1* 6.8*  HCT 27.1* 25.7* 20.9*  WBC 5.6 4.9 3.7*  PLT 140* 127* 128*   Ct Chest Wo Contrast  04/13/2014   CLINICAL DATA:  68 year old immunocompromised male with intermittent fever. Initial encounter. Current history of renal failure status post renal  transplant and transplant project.  EXAM: CT CHEST WITHOUT CONTRAST  TECHNIQUE: Multidetector CT imaging of the chest was performed following the standard protocol without IV contrast.  COMPARISON:  Chest radiograph 04/11/2014 and earlier. Chest CT 04/07/2014  FINDINGS: Improved lung volumes. Decreased dependent atelectasis in both lungs, however, confluent peribronchovascular opacity persists in both lower lobes in the lingula, with central air bronchograms. See series 3. The right middle lobe is spared. The right upper lobe is spared. The superior aspect of the left upper lobe is spared.  Only trace associated left pleural fluid. No pericardial effusion. Stable cardiomegaly. No hilar or mediastinal lymphadenopathy. No axillary lymphadenopathy.  Gynecomastia. Stable visualized upper abdominal viscera. Aortic ectasia and calcified atherosclerosis re- identified. Coronary artery stents and atherosclerosis. Sequelae of median sternotomy. Right subclavian venous vascular stent re- identified.  No acute osseous abnormality identified.  IMPRESSION: 1. Improved lung volumes with resolved dependent atelectasis, but persistent confluent bilateral lower lobe and lingula peribronchovascular consolidation. Favor bronchopneumonia in this setting. Noninfectious considerations such as post transplant lymphoproliferative disorder are felt less likely. 2. Trace left pleural fluid. 3. Otherwise stable chest and upper abdominal viscera compared to 04/07/2014.   Electronically Signed   By: Lars Pinks M.D.   On: 04/13/2014 06:00   Dg Chest Port 1 View  04/11/2014   CLINICAL DATA:  Fever and chills.  EXAM: PORTABLE CHEST - 1 VIEW  COMPARISON:  Chest CT, 04/07/2014.  Chest radiographs, 04/06/2014.  FINDINGS: Patchy areas of left mid and lower lung consolidation and milder right lung base consolidation, this similar to the prior studies allowing for differences in technique and inspiratory volumes. No new areas of lung consolidation.  No convincing pulmonary edema. No pleural effusion or pneumothorax.  Changes from cardiac surgery are stable. Cardiac silhouette is mildly enlarged. No mediastinal or hilar masses. Stable right subclavian vein stent.  IMPRESSION: Persist consolidation in the left mid and lower lung and in the right lung base similar to the prior studies. This is most suggestive of multifocal pneumonia. No new abnormalities.   Electronically Signed   By: Lajean Manes M.D.   On: 04/11/2014 20:46    ASSESSMENT / PLAN:  Recurrent/Persistent (?) HCAP - Bilateral lobe and lingula infiltrate - improved lung volumes, atelectasis and areas of infiltrate (11/18 CT Chest vs 11/12)  Possible sources of infection in immunocompromised pt with persistent infiltrates/HCAP consider etiologies including CAP microbes, atypicals (legionella), pseudomonas, PCP, CMV, MAC, Fungal, TB, etc.  Also in the Ddx is non-infectious causes of persistent infiltrates, such as pulmonary edema, autoimmune (especially in the setting of elevated sed rate with chronic steroid use), pneumonitis and malignancy.    Continue current antibiotic therapy and agree with current pending workup and may consider adding full respiratory viral panel (Flu PCR was neg 03/27/14), LDH for PCP and CMV PCR.  Plan for Bronch 11/19 for BAL with possible biopsy.  Depending on results of bronch, and in the setting of continued fever without pulmonary symptoms (inspiratory chest pain, cough, SOB, dyspnea), consider  further workup for other infectious etiologies.  Delsa Grana PA-S2  Student note for educational purposes only - please see PCCM rounding note.  04/13/2014, 1:55 PM

## 2014-04-13 NOTE — Plan of Care (Signed)
Problem: Phase I Progression Outcomes Goal: Pain controlled with appropriate interventions Outcome: Completed/Met Date Met:  23-Sep-2013

## 2014-04-13 NOTE — Clinical Documentation Improvement (Signed)
Please clarify anemia. Thank you.  . Documentation of Anemia should include the type of anemia: --Nutritional --Hemolytic --Due to blood loss --Other (please specify) . Include in documentation if Anemia is due to nutrition or mineral deficits, resulting in a nutritional anemia . Document any "cause-and-effect" relationship between the intervention and the blood or immune disorder . Document the specific drug if anemia is drug-induced . Link any laboratory findings to a related diagnosis (if appropriate) . Document any associated diagnoses/conditions  Supporting Information:  History of Anemia and renal failure Indication for Consultation: Management of ESRD/hemodialysis; anemia, hypertension/volume and secondary hyperparathyroidism   Component     Latest Ref Rng 04/11/2014 04/12/2014 04/13/2014            Hemoglobin     13.0 - 17.0 g/dL 8.6 (L) 8.1 (L) 6.8 (LL)  HCT     39.0 - 52.0 % 27.1 (L) 25.7 (L) 20.9 (L)   Treatment 11/18 Transfuse 2 units PRBC Pre and post HD CBC Aranesp 152mcg every Wednesday during HD  Thank You, Jakeia Carreras T. Pricilla Handler, MSN, MBA/MHA Clinical Documentation Specialist [email or web address] Office # [phone]

## 2014-04-13 NOTE — Progress Notes (Signed)
Discussed with Drs Baxter Flattery and [facility]   [facility] PROGRESS NOTE  Brandon Sharp KZS:[id number] DOB: 12/22/1945 DOA: 04/11/2014 PCP: [name], MD  Assessment/Plan:  Principal Problem:   Fever of unknown origin (FUO): appreciate ID. Have consulted pulmonary for bronch. Agree with monitoring off abx. RF slightly elevated. Clinical significance not clear, but has done better each time prednisone increased transiently Active Problems:   Heart transplanted   DM (diabetes mellitus), type 2 with renal complications   ESRD on dialysis   Immunosuppressed status  Code Status:  full Family Communication:   Disposition Plan:  home  HPI/Subjective: No complaints  Objective: Filed Vitals:   04/13/14 1144  BP: 171/65  Pulse: 75  Temp: 97.5 F (36.4 C)  Resp: 16    Intake/Output Summary (Last 24 hours) at 04/13/14 1746 Last data filed at 04/13/14 1300  Gross per 24 hour  Intake    720 ml  Output   1900 ml  Net  -1180 ml   Filed Weights   04/12/14 1949 04/13/14 0708 04/13/14 1126  Weight: 72.235 kg (159 lb 4 oz) 73.4 kg (161 lb 13.1 oz) 71.8 kg (158 lb 4.6 oz)    Exam:   General:  Nontoxic, comfortable in chair. A and o  Cardiovascular: RRR without MGR  Respiratory: CTA without MGR  Abdomen: S, nt, nd  Ext: no CCE  Basic Metabolic Panel:  Recent Labs Lab 04/06/14 1900 04/07/14 0300 04/08/14 0326 04/11/14 2108 04/12/14 0105 04/13/14 0720  NA 139  --  131* 137 140 137  K 3.5*  --  3.3* 3.3* 3.5* 3.7  CL 90*  --  87* 93* 96 95*  CO2 28  --  25 27 28 23   GLUCOSE 75  --  132* 62* 88 120*  BUN 16  --  40* 16 20 44*  CREATININE 3.90* 5.03* 6.95* 3.84* 4.31* 7.08*  CALCIUM 9.3  --  8.7 8.7 8.1* 8.6  PHOS  --   --   --   --   --  4.4   Liver Function Tests:  Recent Labs Lab 04/06/14 1855 04/11/14 2108 04/12/14 0105 04/13/14 0720  AST 47* 31 26  --   ALT 17 21 18   --   ALKPHOS 62 54 48  --   BILITOT 0.3 0.3 0.3  --   PROT 7.1 6.4 5.7*   --   ALBUMIN 2.6* 2.3* 2.2* 2.1*   No results for input(s): LIPASE, AMYLASE in the last 168 hours. No results for input(s): AMMONIA in the last 168 hours. CBC:  Recent Labs Lab 04/08/14 0326 04/10/14 0840 04/11/14 2108 04/12/14 0105 04/13/14 0720  WBC 3.7* 3.4* 5.6 4.9 3.7*  NEUTROABS  --   --  3.7 3.1  --   HGB 8.0* 8.9* 8.6* 8.1* 6.8*  HCT 24.2* 27.7* 27.1* 25.7* 20.9*  MCV 94.5 99.3 95.8 96.3 94.1  PLT 98* 138* 140* 127* 128*   Cardiac Enzymes:  Recent Labs Lab 04/06/14 1855  TROPONINI <0.30   BNP (last 3 results)  Recent Labs  03/27/14 1747 04/12/14 0105  PROBNP 37853.0* 45045.0*   CBG:  Recent Labs Lab 04/12/14 1955 04/13/14 0005 04/13/14 0421 04/13/14 1314 04/13/14 1631  GLUCAP 318* 210* 159* 107* 143*    Recent Results (from the past 240 hour(s))  Blood culture (routine x 2)     Status: None   Collection Time: 04/06/14  6:55 PM  Result Value Ref Range Status   Specimen Description BLOOD RIGHT ARM  Final   Special Requests BOTTLES DRAWN AEROBIC AND ANAEROBIC 5ML  Final   Culture  Setup Time   Final    04/07/2014 01:07 Performed at [facility]   Final    NO GROWTH 5 DAYS Performed at [facility]    Report Status 04/13/2014 FINAL  Final  Blood culture (routine x 2)     Status: None   Collection Time: 04/06/14  7:12 PM  Result Value Ref Range Status   Specimen Description BLOOD RIGHT ARM  Final   Special Requests BOTTLES DRAWN AEROBIC AND ANAEROBIC 5ML  Final   Culture  Setup Time   Final    04/07/2014 01:07 Performed at [facility]    Culture   Final    NO GROWTH 5 DAYS Performed at [facility]    Report Status 04/13/2014 FINAL  Final  MRSA PCR Screening     Status: None   Collection Time: 04/07/14  7:02 AM  Result Value Ref Range Status   MRSA by PCR NEGATIVE NEGATIVE Final    Comment:        The GeneXpert MRSA Assay (FDA approved for NASAL specimens only), is one component of  a comprehensive MRSA colonization surveillance program. It is not intended to diagnose MRSA infection nor to guide or monitor treatment for MRSA infections.   Culture, blood (routine x 2)     Status: None (Preliminary result)   Collection Time: 04/11/14  9:01 PM  Result Value Ref Range Status   Specimen Description BLOOD LEFT ARM  Final   Special Requests BOTTLES DRAWN AEROBIC AND ANAEROBIC 10CC EACH  Final   Culture  Setup Time   Final    04/12/2014 01:15 Performed at [facility]    Culture   Final           BLOOD CULTURE RECEIVED NO GROWTH TO DATE CULTURE WILL BE HELD FOR 5 DAYS BEFORE ISSUING A FINAL NEGATIVE REPORT Performed at [facility]    Report Status PENDING  Incomplete  Fungus culture, blood     Status: None (Preliminary result)   Collection Time: 04/11/14  9:01 PM  Result Value Ref Range Status   Specimen Description BLOOD LEFT ARM  Final   Special Requests   Final    BOTTLES DRAWN AEROBIC AND ANAEROBIC 10CC ADDED ON 04/12/14 AT 1700   Culture   Final    NO FUNGUS ISOLATED;CULTURE IN PROGRESS FOR 7 DAYS Performed at [facility]    Report Status PENDING  Incomplete  Culture, blood (routine x 2)     Status: None (Preliminary result)   Collection Time: 04/11/14  9:07 PM  Result Value Ref Range Status   Specimen Description BLOOD PORTA CATH  Final   Special Requests BOTTLES DRAWN AEROBIC ONLY 3CC  Final   Culture  Setup Time   Final    04/12/2014 01:15 Performed at [facility]    Culture   Final           BLOOD CULTURE RECEIVED NO GROWTH TO DATE CULTURE WILL BE HELD FOR 5 DAYS BEFORE ISSUING A FINAL NEGATIVE REPORT Performed at [facility]    Report Status PENDING  Incomplete     Studies: Ct Chest Wo Contrast  04/13/2014   CLINICAL DATA:  68 year old immunocompromised male with intermittent fever. Initial encounter. Current history of renal failure status post renal transplant and transplant project.  EXAM: CT  CHEST WITHOUT  CONTRAST  TECHNIQUE: Multidetector CT imaging of the chest was performed following the standard protocol without IV contrast.  COMPARISON:  Chest radiograph 04/11/2014 and earlier. Chest CT 04/07/2014  FINDINGS: Improved lung volumes. Decreased dependent atelectasis in both lungs, however, confluent peribronchovascular opacity persists in both lower lobes in the lingula, with central air bronchograms. See series 3. The right middle lobe is spared. The right upper lobe is spared. The superior aspect of the left upper lobe is spared.  Only trace associated left pleural fluid. No pericardial effusion. Stable cardiomegaly. No hilar or mediastinal lymphadenopathy. No axillary lymphadenopathy.  Gynecomastia. Stable visualized upper abdominal viscera. Aortic ectasia and calcified atherosclerosis re- identified. Coronary artery stents and atherosclerosis. Sequelae of median sternotomy. Right subclavian venous vascular stent re- identified.  No acute osseous abnormality identified.  IMPRESSION: 1. Improved lung volumes with resolved dependent atelectasis, but persistent confluent bilateral lower lobe and lingula peribronchovascular consolidation. Favor bronchopneumonia in this setting. Noninfectious considerations such as post transplant lymphoproliferative disorder are felt less likely. 2. Trace left pleural fluid. 3. Otherwise stable chest and upper abdominal viscera compared to 04/07/2014.   Electronically Signed   By: Lars Pinks M.D.   On: 04/13/2014 06:00   Dg Chest Port 1 View  04/11/2014   CLINICAL DATA:  Fever and chills.  EXAM: PORTABLE CHEST - 1 VIEW  COMPARISON:  Chest CT, 04/07/2014.  Chest radiographs, 04/06/2014.  FINDINGS: Patchy areas of left mid and lower lung consolidation and milder right lung base consolidation, this similar to the prior studies allowing for differences in technique and inspiratory volumes. No new areas of lung consolidation. No convincing pulmonary edema. No pleural  effusion or pneumothorax.  Changes from cardiac surgery are stable. Cardiac silhouette is mildly enlarged. No mediastinal or hilar masses. Stable right subclavian vein stent.  IMPRESSION: Persist consolidation in the left mid and lower lung and in the right lung base similar to the prior studies. This is most suggestive of multifocal pneumonia. No new abnormalities.   Electronically Signed   By: Lajean Manes M.D.   On: 04/11/2014 20:46    Scheduled Meds: . sodium chloride   Intravenous Once  . aspirin EC  81 mg Oral Daily  . atorvastatin  40 mg Oral Daily  . azaTHIOprine  75 mg Oral q morning - 10a  . calcium acetate  667 mg Oral TID WC  . carvedilol  6.25 mg Oral QHS  . colesevelam  3,750 mg Oral Daily  . darbepoetin (ARANESP) injection - DIALYSIS  100 mcg Intravenous Q Wed-HD  . doxercalciferol  2 mcg Intravenous Q M,W,F-HD  . famotidine  10 mg Oral QHS  . heparin  5,000 Units Subcutaneous 3 times per day  . insulin aspart  0-15 Units Subcutaneous 6 times per day  . insulin glargine  20 Units Subcutaneous q morning - 10a  . isosorbide dinitrate  40 mg Oral TID  . multivitamin  1 tablet Oral QHS  . predniSONE  10 mg Oral Q breakfast  . sirolimus  2 mg Oral Daily   Continuous Infusions:   Time spent: 25 minutes  [facility] Pager [phone]. If 7PM-7AM, please contact night-coverage at [email or web address], password [facility] 04/13/2014, 5:46 PM  LOS: 2 days

## 2014-04-13 NOTE — Procedures (Signed)
I was present at this dialysis session, have reviewed the session itself and made  appropriate changes  Kelly Splinter MD (pgr) [phone]    ([phone] 04/13/2014, 11:23 AM

## 2014-04-14 ENCOUNTER — Encounter (HOSPITAL_COMMUNITY)
Admission: EM | Disposition: A | Discharge status: Home / Self Care | Payer: Self-pay | Source: Home / Self Care | Attending: Internal Medicine

## 2014-04-14 ENCOUNTER — Inpatient Hospital Stay (HOSPITAL_COMMUNITY): Payer: Medicare Other

## 2014-04-14 HISTORY — PX: VIDEO BRONCHOSCOPY: SHX5072

## 2014-04-14 LAB — TYPE AND SCREEN
ABO/RH(D): O POS
Antibody Screen: NEGATIVE
UNIT DIVISION: 0
Unit division: 0

## 2014-04-14 LAB — PROTEIN ELECTROPHORESIS, SERUM
ALPHA-1-GLOBULIN: 10.2 % — AB (ref 2.9–4.9)
ALPHA-2-GLOBULIN: 19.1 % — AB (ref 7.1–11.8)
Albumin ELP: 46.5 % — ABNORMAL LOW (ref 55.8–66.1)
Beta 2: 11.4 % — ABNORMAL HIGH (ref 3.2–6.5)
Beta Globulin: 4.8 % (ref 4.7–7.2)
GAMMA GLOBULIN: 8 % — AB (ref 11.1–18.8)
M-SPIKE, %: 0.25 g/dL
Total Protein ELP: 5.3 g/dL — ABNORMAL LOW (ref 6.0–8.3)

## 2014-04-14 LAB — QUANTIFERON TB GOLD ASSAY (BLOOD)
Interferon Gamma Release Assay: NEGATIVE
MITOGEN VALUE: 1.16 [IU]/mL
Quantiferon Nil Value: 0.09 IU/mL
TB AG VALUE: 0.09 [IU]/mL
TB Antigen Minus Nil Value: 0 IU/mL

## 2014-04-14 LAB — CBC
HEMATOCRIT: 30.2 % — AB (ref 39.0–52.0)
HEMOGLOBIN: 9.7 g/dL — AB (ref 13.0–17.0)
MCH: 30.2 pg (ref 26.0–34.0)
MCHC: 32.1 g/dL (ref 30.0–36.0)
MCV: 94.1 fL (ref 78.0–100.0)
Platelets: 133 10*3/uL — ABNORMAL LOW (ref 150–400)
RBC: 3.21 MIL/uL — ABNORMAL LOW (ref 4.22–5.81)
RDW: 17.9 % — ABNORMAL HIGH (ref 11.5–15.5)
WBC: 3 10*3/uL — ABNORMAL LOW (ref 4.0–10.5)

## 2014-04-14 LAB — GLUCOSE, CAPILLARY
GLUCOSE-CAPILLARY: 85 mg/dL (ref 70–99)
GLUCOSE-CAPILLARY: 120 mg/dL — AB (ref 70–99)
Glucose-Capillary: 122 mg/dL — ABNORMAL HIGH (ref 70–99)
Glucose-Capillary: 98 mg/dL (ref 70–99)
Glucose-Capillary: 110 mg/dL — ABNORMAL HIGH (ref 70–99)

## 2014-04-14 LAB — CYCLIC CITRUL PEPTIDE ANTIBODY, IGG

## 2014-04-14 SURGERY — BRONCHOSCOPY, WITH FLUOROSCOPY
Anesthesia: Moderate Sedation | Laterality: Bilateral

## 2014-04-14 MED ORDER — LIDOCAINE HCL 2 % EX GEL
CUTANEOUS | Status: DC | PRN
  Administered 2014-04-14: 1

## 2014-04-14 MED ORDER — FENTANYL CITRATE 0.05 MG/ML IJ SOLN
INTRAMUSCULAR | Status: AC
  Filled 2014-04-14: qty 4

## 2014-04-14 MED ORDER — MIDAZOLAM HCL 10 MG/2ML IJ SOLN
INTRAMUSCULAR | Status: DC | PRN
  Administered 2014-04-14: 3 mg via INTRAVENOUS
  Administered 2014-04-14: 1 mg via INTRAVENOUS

## 2014-04-14 MED ORDER — HYDRALAZINE HCL 20 MG/ML IJ SOLN
INTRAMUSCULAR | Status: AC
  Filled 2014-04-14: qty 1

## 2014-04-14 MED ORDER — MIDAZOLAM HCL 5 MG/ML IJ SOLN
INTRAMUSCULAR | Status: AC
  Filled 2014-04-14: qty 2

## 2014-04-14 MED ORDER — PHENYLEPHRINE HCL 0.25 % NA SOLN
NASAL | Status: DC | PRN
  Administered 2014-04-14: 2 via NASAL

## 2014-04-14 MED ORDER — SODIUM CHLORIDE 0.9 % IV SOLN
Freq: Once | INTRAVENOUS | Status: AC
  Administered 2014-04-14: 10:00:00 via INTRAVENOUS

## 2014-04-14 MED ORDER — FENTANYL CITRATE 0.05 MG/ML IJ SOLN
INTRAMUSCULAR | Status: DC | PRN
  Administered 2014-04-14 (×3): 50 ug via INTRAVENOUS

## 2014-04-14 MED ORDER — LIDOCAINE HCL (PF) 1 % IJ SOLN
INTRAMUSCULAR | Status: DC | PRN
  Administered 2014-04-14: 6 mL

## 2014-04-14 MED ORDER — HYDRALAZINE HCL 20 MG/ML IJ SOLN
20.0000 mg | Freq: Once | INTRAMUSCULAR | Status: AC
  Administered 2014-04-14: 20 mg via INTRAVENOUS

## 2014-04-14 NOTE — Progress Notes (Signed)
Video bronchoscopy with washing intervention, biospy intervention. Report was called back to [place], at 1245, pt. Going to MRI for testing. Pt. Did well thru procedure.  Baltazar Apo, MD, PhD 04/15/2014, 1:22 PM [facility] [phone] or if no answer [phone]

## 2014-04-14 NOTE — Progress Notes (Signed)
Subjective:  Feeling stronger / Today for 10 am Bronchoscopy/ tolerated HD yesterday No shaking chills or rigors as when I saw him at OP HD wed  Objective Vital signs in last 24 hours: Filed Vitals:   04/13/14 1126 04/13/14 1144 04/13/14 2137 04/14/14 0416  BP: 157/76 171/65 112/56 126/59  Pulse: 72 75 80 70  Temp: 97.2 F (36.2 C) 97.5 F (36.4 C) 98.5 F (36.9 C) 98.2 F (36.8 C)  TempSrc: Oral Oral Oral Oral  Resp: 14 16 16 16   Height:   6' (1.829 m)   Weight: 71.8 kg (158 lb 4.6 oz)  72 kg (158 lb 11.7 oz)   SpO2: 100% 100% 94% 97%   Weight change: 1.165 kg (2 lb 9.1 oz)  Physical Exam: General: Alert, NAD OX3, Appropriate Heart: RRR Lungs: CTA bilat. Abdomen: BS =, soft , NT, ND Extremities: No pedal edema Dialysis Access: Pos bruit R UA AVF  Dialysis Orders: MWF @ [facility] 4 hr 71.5kgs 2K/2.25ca 400/1.5 3000 Heparin R AVF hectorol 59mcg IV/HD Aranesp 100 Venofer- none  Problem/Plan: 1. Recurrent PNA / pulm infiltrates / fever-  Immunocompromised on Meds for Heart TX/  48 hr Afebrile LML/LLL nodular infiltrates.n ID and Pulm  Following = For Bronch. OP  Bld cul. At [facility] cent.04/11/14  Neg . So far this am and in ED- no growth.  Currently offVanc and cefepime  2. ESRD - MWF @ [facility]. K+ 3.7. HD on schedule 3. Hypertension/volume -  Am bp =126/59. On coreg 6.25mg  hs 4. Anemia - hgb 8.6-> 8.1->6.8yesterday cont Aranesp 100. Transfused 2u RBC yesterday. Stool cards pending. No Fe, last tsat 29 5. Metabolic bone disease - EV+03.5 corrected/phos 4.4 ,continue phoslo and  Hold 31mcg hectorol  On hd with sl ^ ca . Last PTH 215. 6. Nutrition - alb 2.1. Renal diet. multivit 7. DM- per primary 8. Heart transplant - imuran, Rapamune, prednisone  Ernest Haber, PA-C [facility] Beeper [phone] 04/14/2014,8:23 AM  LOS: 3 days    Pt seen, examined and agree w A/P as above. Had bronchoscopy this am with biopsies, cultures and cytology sent.  Stable from  renal standpoint.  HD tomorrow.  Kelly Splinter MD pager [phone]    cell [phone] 04/14/2014, 1:40 PM    Labs: Basic Metabolic Panel:  Recent Labs Lab 04/11/14 2108 04/12/14 0105 04/13/14 0720  NA 137 140 137  K 3.3* 3.5* 3.7  CL 93* 96 95*  CO2 27 28 23   GLUCOSE 62* 88 120*  BUN 16 20 44*  CREATININE 3.84* 4.31* 7.08*  CALCIUM 8.7 8.1* 8.6  PHOS  --   --  4.4   Liver Function Tests:  Recent Labs Lab 04/11/14 2108 04/12/14 0105 04/13/14 0720  AST 31 26  --   ALT 21 18  --   ALKPHOS 54 48  --   BILITOT 0.3 0.3  --   PROT 6.4 5.7*  --   ALBUMIN 2.3* 2.2* 2.1*   No results for input(s): LIPASE, AMYLASE in the last 168 hours. No results for input(s): AMMONIA in the last 168 hours. CBC:  Recent Labs Lab 04/08/14 0326 04/10/14 0840 04/11/14 2108 04/12/14 0105 04/13/14 0720  WBC 3.7* 3.4* 5.6 4.9 3.7*  NEUTROABS  --   --  3.7 3.1  --   HGB 8.0* 8.9* 8.6* 8.1* 6.8*  HCT 24.2* 27.7* 27.1* 25.7* 20.9*  MCV 94.5 99.3 95.8 96.3 94.1  PLT 98* 138* 140* 127* 128*   CBG:  Recent  Labs Lab 04/13/14 1314 04/13/14 1631 04/13/14 2003 04/14/14 0006 04/14/14 0412  GLUCAP 107* 143* 210* 122* 98   Medications:   . sodium chloride   Intravenous Once  . aspirin EC  81 mg Oral Daily  . atorvastatin  40 mg Oral Daily  . azaTHIOprine  75 mg Oral q morning - 10a  . calcium acetate  667 mg Oral TID WC  . carvedilol  6.25 mg Oral QHS  . colesevelam  3,750 mg Oral Daily  . darbepoetin (ARANESP) injection - DIALYSIS  100 mcg Intravenous Q Wed-HD  . doxercalciferol  2 mcg Intravenous Q M,W,F-HD  . famotidine  10 mg Oral QHS  . heparin  5,000 Units Subcutaneous 3 times per day  . insulin aspart  0-15 Units Subcutaneous 6 times per day  . insulin glargine  20 Units Subcutaneous q morning - 10a  . isosorbide dinitrate  40 mg Oral TID  . multivitamin  1 tablet Oral QHS  . predniSONE  10 mg Oral Q breakfast  . sirolimus  2 mg Oral Daily

## 2014-04-14 NOTE — Op Note (Signed)
Video Bronchoscopy Procedure Note  Date of Operation: 04/14/2014  Pre-op Diagnosis: B infiltrates  Post-op Diagnosis: Same  Surgeon: Baltazar Apo  Assistants: none  Anesthesia: conscious sedation, moderate sedation  Meds Given: fentanyl 153mcg, versed 4mg  in divided doses, 1% lidocaine 20cc total  Operation: Flexible video fiberoptic bronchoscopy and biopsies.  Estimated Blood Loss: 73-73GK  Complications: none noted  Indications and History: Brandon Sharp is 68 y.o. with history of ESRD, cardiac transplant with immunosuppression noted to have B LL infiltrates.  Recommendation was to perform video fiberoptic bronchoscopy with BAL and biopsies. The risks, benefits, complications, treatment options and expected outcomes were discussed with the patient.  The possibilities of pneumothorax, pneumonia, reaction to medication, pulmonary aspiration, perforation of a viscus, bleeding, failure to diagnose a condition and creating a complication requiring transfusion or operation were discussed with the patient who freely signed the consent.    Description of Procedure: The patient was seen in the Preoperative Area, was examined and was deemed appropriate to proceed.  The patient was taken to [facility], identified as Brandon Sharp and the procedure verified as Flexible Video Fiberoptic Bronchoscopy.  A Time Out was held and the above information confirmed.   Conscious sedation was initiated as indicated above. The video fiberoptic bronchoscope was introduced via the L nare and a general inspection was performed which showed normal cords, normal trachea, normal main carina. The R sided airways were inspected and showed normal RUL, BI, RML and RLL. The L side was then inspected. The Lingular and LUL airways were normal. The LLL airways were erythematous and edematous. There was a small hypopigmented area of edematous mucosa in the basilar segment that was biopsied. Under fluoroscopic  guidance transbronchial biopsies were collected from the LLL for pathology. Finally, BAL was performed in the lingula for culture and cytology. There was some oozing from the endobronchial biopsy site in the LLL with good hemostasis by the end of the case. The patient tolerated the procedure well. The bronchoscope was removed. There were no obvious complications.   Samples: 1. Endobronchial biopsies from LLL basilar segment 2. Transbronchial biopsies from the LLL 3. Bronchial washings from Lingula  Plans:  We will review the cytology, pathology and microbiology results with the patient when they become available.  Outpatient followup can be with Dr Lamonte Sakai.    Baltazar Apo, MD, PhD 04/14/2014, 11:53 AM [facility] [phone] or if no answer [phone]

## 2014-04-14 NOTE — Progress Notes (Addendum)
[facility] PROGRESS NOTE  Brandon Sharp LFY:[id number] DOB: 1945/08/05 DOA: 04/11/2014 PCP: [name], MD Summary 68 yo male with heart transplant, ESRD, DM treated for hcap with 3 admissions this month for persistent fevers. last admission Extensively worked up with Cultures, UA, HIV, EBV, cryptococcal ag, acute hepatitis panel  CT abd pelvis \  Assessment/Plan:  Principal Problem:   Fever of unknown origin (FUO): appreciate ID, pulmonary. S/P bronch/bx, washings. RF slightly elevated. Clinical significance not clear, but has done better each time prednisone increased transiently. 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 Suspect yesterday's hgb of 6.8 erroneous, as after 1 unit prbc, up to 9.7 today  Code Status:  full Family Communication:   Disposition Plan:  home  HPI/Subjective: No complaints  Objective: Filed Vitals:   04/14/14 1700  BP: 140/60  Pulse: 80  Temp: 98.2 F (36.8 C)  Resp: 20    Intake/Output Summary (Last 24 hours) at 04/14/14 1807 Last data filed at 04/14/14 1300  Gross per 24 hour  Intake      0 ml  Output      0 ml  Net      0 ml   Filed Weights   04/13/14 0708 04/13/14 1126 04/13/14 2137  Weight: 73.4 kg (161 lb 13.1 oz) 71.8 kg (158 lb 4.6 oz) 72 kg (158 lb 11.7 oz)    Exam:   General:  Nontoxic, comfortable in chair. A and o  Cardiovascular: RRR without MGR  Respiratory: CTA without MGR  Abdomen: S, nt, nd  Ext: no CCE  Basic Metabolic Panel:  Recent Labs Lab 04/08/14 0326 04/11/14 2108 04/12/14 0105 04/13/14 0720  NA 131* 137 140 137  K 3.3* 3.3* 3.5* 3.7  CL 87* 93* 96 95*  CO2 25 27 28 23   GLUCOSE 132* 62* 88 120*  BUN 40* 16 20 44*  CREATININE 6.95* 3.84* 4.31* 7.08*  CALCIUM 8.7 8.7 8.1* 8.6  PHOS  --   --   --  4.4    Liver Function Tests:  Recent Labs Lab 04/11/14 2108 04/12/14 0105 04/13/14 0720  AST 31 26  --   ALT 21 18  --   ALKPHOS 54 48  --   BILITOT 0.3 0.3  --   PROT 6.4 5.7*  --   ALBUMIN 2.3* 2.2* 2.1*   No results for input(s): LIPASE, AMYLASE in the last 168 hours. No results for input(s): AMMONIA in the last 168 hours. CBC:  Recent Labs Lab 04/10/14 0840 04/11/14 2108 04/12/14 0105 04/13/14 0720 04/14/14 0850  WBC 3.4* 5.6 4.9 3.7* 3.0*  NEUTROABS  --  3.7 3.1  --   --   HGB 8.9* 8.6* 8.1* 6.8* 9.7*  HCT 27.7* 27.1* 25.7* 20.9* 30.2*  MCV 99.3 95.8 96.3 94.1 94.1  PLT 138* 140* 127* 128* 133*   Cardiac Enzymes: No results for input(s): CKTOTAL, CKMB, CKMBINDEX, TROPONINI in the last 168 hours. BNP (last 3 results)  Recent Labs  03/27/14 1747 04/12/14 0105  PROBNP 37853.0* 45045.0*   CBG:  Recent Labs Lab 04/14/14 0006 04/14/14 0412 04/14/14 0808 04/14/14 1434 04/14/14 1713  GLUCAP 122* 98 110* 85 120*    Recent Results (from the past 240 hour(s))  Blood culture (routine x 2)     Status: None   Collection Time: 04/06/14  6:55 PM  Result Value Ref Range Status   Specimen Description BLOOD RIGHT ARM  Final   Special Requests BOTTLES DRAWN AEROBIC AND ANAEROBIC 5ML  Final   Culture  Setup Time   Final    04/07/2014 01:07 Performed at [place]   Final    NO GROWTH 5 DAYS Performed at [facility]    Report Status 04/13/2014 FINAL  Final  Blood culture (routine x 2)     Status: None   Collection Time: 04/06/14  7:12 PM  Result Value Ref Range Status   Specimen Description BLOOD RIGHT ARM  Final   Special Requests BOTTLES DRAWN AEROBIC AND ANAEROBIC 5ML  Final   Culture  Setup Time   Final    04/07/2014 01:07 Performed at [facility]    Culture   Final    NO GROWTH 5 DAYS Performed at [facility]    Report Status 04/13/2014 FINAL  Final  MRSA PCR Screening     Status: None   Collection  Time: 04/07/14  7:02 AM  Result Value Ref Range Status   MRSA by PCR NEGATIVE NEGATIVE Final    Comment:        The GeneXpert MRSA Assay (FDA approved for NASAL specimens only), is one component of a comprehensive MRSA colonization surveillance program. It is not intended to diagnose MRSA infection nor to guide or monitor treatment for MRSA infections.   Culture, blood (routine x 2)     Status: None (Preliminary result)   Collection Time: 04/11/14  9:01 PM  Result Value Ref Range Status   Specimen Description BLOOD LEFT ARM  Final   Special Requests BOTTLES DRAWN AEROBIC AND ANAEROBIC 10CC EACH  Final   Culture  Setup Time   Final    04/12/2014 01:15 Performed at [facility]    Culture   Final           BLOOD CULTURE RECEIVED NO GROWTH TO DATE CULTURE WILL BE HELD FOR 5 DAYS BEFORE ISSUING A FINAL NEGATIVE REPORT Performed at [facility]    Report Status PENDING  Incomplete  Fungus culture, blood     Status: None (Preliminary result)   Collection Time: 04/11/14  9:01 PM  Result Value Ref Range Status   Specimen Description BLOOD LEFT ARM  Final   Special Requests   Final    BOTTLES DRAWN AEROBIC AND ANAEROBIC 10CC ADDED ON 04/12/14 AT 1700   Culture   Final    NO FUNGUS ISOLATED;CULTURE IN PROGRESS FOR 7 DAYS Performed at [facility]    Report Status PENDING  Incomplete  Culture, blood (routine x 2)     Status: None (Preliminary result)   Collection Time: 04/11/14  9:07 PM  Result Value Ref Range Status   Specimen Description BLOOD PORTA CATH  Final   Special Requests BOTTLES DRAWN AEROBIC ONLY 3CC  Final   Culture  Setup Time   Final    04/12/2014 01:15 Performed at [facility]    Culture   Final           BLOOD CULTURE RECEIVED NO GROWTH TO DATE CULTURE WILL BE HELD FOR 5 DAYS BEFORE ISSUING A FINAL NEGATIVE REPORT Performed at [facility]    Report Status PENDING  Incomplete     Studies: Ct Chest Wo  Contrast  04/13/2014   CLINICAL DATA:  68 year old immunocompromised male with intermittent fever. Initial encounter. Current  history of renal failure status post renal transplant and transplant project.  EXAM: CT CHEST WITHOUT CONTRAST  TECHNIQUE: Multidetector CT imaging of the chest was performed following the standard protocol without IV contrast.  COMPARISON:  Chest radiograph 04/11/2014 and earlier. Chest CT 04/07/2014  FINDINGS: Improved lung volumes. Decreased dependent atelectasis in both lungs, however, confluent peribronchovascular opacity persists in both lower lobes in the lingula, with central air bronchograms. See series 3. The right middle lobe is spared. The right upper lobe is spared. The superior aspect of the left upper lobe is spared.  Only trace associated left pleural fluid. No pericardial effusion. Stable cardiomegaly. No hilar or mediastinal lymphadenopathy. No axillary lymphadenopathy.  Gynecomastia. Stable visualized upper abdominal viscera. Aortic ectasia and calcified atherosclerosis re- identified. Coronary artery stents and atherosclerosis. Sequelae of median sternotomy. Right subclavian venous vascular stent re- identified.  No acute osseous abnormality identified.  IMPRESSION: 1. Improved lung volumes with resolved dependent atelectasis, but persistent confluent bilateral lower lobe and lingula peribronchovascular consolidation. Favor bronchopneumonia in this setting. Noninfectious considerations such as post transplant lymphoproliferative disorder are felt less likely. 2. Trace left pleural fluid. 3. Otherwise stable chest and upper abdominal viscera compared to 04/07/2014.   Electronically Signed   By: Lars Pinks M.D.   On: 04/13/2014 06:00   Mr Brain Wo Contrast  04/14/2014   CLINICAL DATA:  68 year old male with immunosuppression following organ transplantation presents with symptoms of fever, malaise, and encephalopathy. Initial encounter.  EXAM: MRI HEAD WITHOUT CONTRAST   TECHNIQUE: Multiplanar, multiecho pulse sequences of the brain and surrounding structures were obtained without intravenous contrast.  COMPARISON:  CT head 04/06/2014.  MR head 05/15/2003.  FINDINGS: There is a 1 cm sized area of diffusion restriction in the RIGHT occipital subcortical white matter with slight associated T2 and FLAIR prolongation. No other similar lesions. No acute hemorrhage is present, although is small focus chronic hemorrhage was noted in 2004 in the RIGHT posterior temporal subcortical white matter, with additional tiny foci of chronic hemorrhage in the RIGHT temporal and LEFT frontal cortex.  Generalized atrophy. Multiple BILATERAL chronic cerebellar infarcts. BILATERAL deep nuclei chronic lacunar infarcts. Minor changes of subcortical and periventricular white matter signal abnormality, likely chronic microvascular ischemic change.  Normal pituitary and cerebellar tonsils. Advanced cervical spondylosis with suspected slight cord compression.  Flow voids are maintained in the carotid, basilar, and vertebral arteries.  Abnormalities of the extracranial soft tissues include significant fluid accumulation with probable retention cyst formation the LEFT maxillary sinus causing expansion, and BILATERAL T2 hyperintense fluid in the mastoid air cells, favored to represent benign effusions. No sinus air-fluid level. Negative orbits. Extracranial soft tissues unremarkable.  No contrast was given in this patient renal failure, but there are no obvious features of pachymeningeal thickening or sulcal FLAIR hyperintensity to suggest meningitis.  IMPRESSION: Nonspecific 1 cm focus of restricted diffusion in the RIGHT occipital subcortical white matter. The patient has evidence for multiple remote areas of infarction and certainly this is a leading differential consideration.  In the setting of immunosuppression, with the patient on Imuran, neurotoxicity secondary to this drug is associated with abnormal  diffusion signal in the posterior circulation, including occipital cortex and white matter. Similar findings were seen on prior MR from 2004, but were more extensive at that time.  Cerebritis or brain abscess, well also associated with restricted diffusion, is not favored based on lesion morphology and lack of surrounding vasogenic edema. Intracranial infection is not favored as a cause of the patient's  fever. In addition, LEFT maxillary sinus disease and BILATERAL mastoid effusions are likely non-worrisome.   Electronically Signed   By: Rolla Flatten M.D.   On: 04/14/2014 15:44   Dg Chest Port 1 View  04/14/2014   CLINICAL DATA:  Status post left bronchoscopy lung biopsy.  EXAM: PORTABLE CHEST - 1 VIEW  COMPARISON:  04/11/2014  FINDINGS: Persistent bibasilar pulmonary airspace opacity again seen, left side greater than right. No evidence of pneumothorax. Cardiomegaly is stable. Bilateral subclavian stents again seen in place. Patient has undergone previous median sternotomy.  IMPRESSION: No significant change in bibasilar airspace disease and cardiomegaly. No pneumothorax identified.   Electronically Signed   By: Earle Gell M.D.   On: 04/14/2014 12:32    Scheduled Meds: . sodium chloride   Intravenous Once  . aspirin EC  81 mg Oral Daily  . atorvastatin  40 mg Oral Daily  . azaTHIOprine  75 mg Oral q morning - 10a  . calcium acetate  667 mg Oral TID WC  . carvedilol  6.25 mg Oral QHS  . colesevelam  3,750 mg Oral Daily  . darbepoetin (ARANESP) injection - DIALYSIS  100 mcg Intravenous Q Wed-HD  . doxercalciferol  2 mcg Intravenous Q M,W,F-HD  . famotidine  10 mg Oral QHS  . heparin  5,000 Units Subcutaneous 3 times per day  . insulin aspart  0-15 Units Subcutaneous 6 times per day  . insulin glargine  20 Units Subcutaneous q morning - 10a  . isosorbide dinitrate  40 mg Oral TID  . multivitamin  1 tablet Oral QHS  . predniSONE  10 mg Oral Q breakfast  . sirolimus  2 mg Oral Daily    Continuous Infusions:   Time spent: 25 minutes  [facility] Pager [phone]. If 7PM-7AM, please contact night-coverage at [email or web address], password [facility] 04/14/2014, 6:07 PM  LOS: 3 days

## 2014-04-14 NOTE — Progress Notes (Signed)
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  Has had multiple admits in last 6 months for fever and presumed HCAP.  Work-up has been negative thus far and PCCM called to assist and ?FOB.    SIGNIFICANT EVENTS  07/24/13 - 07/29/13: Admit for HCAP/Influenza A - dc with doxy and flagyl 03/27/14 - 03/30/14: Admit for fever, sepsis, LLL HCAP and ESRD on dialysis - d/c with doxycycline and ceftin 04/06/14 - 04/10/14 : AMS, weakness, fever of unknown origin, LLL HCAP - Abx d/c and increased prednisone for AI 04/11/14: Admitted for fever of unknown origin in immunocompromised pt, recurrent/persistant HCAP  STUDIES:  CT chest 11/18>>> persistent bilat LL consolidation but overall improved lung volumes.     SUBJECTIVE:   VITAL SIGNS: Temp:  [97.2 F (36.2 C)-98.5 F (36.9 C)] 98.2 F (36.8 C) (11/19 0416) Pulse Rate:  [70-80] 70 (11/19 0416) Resp:  [12-16] 16 (11/19 0416) BP: (111-171)/(56-78) 126/59 mmHg (11/19 0416) SpO2:  [94 %-100 %] 97 % (11/19 0416) Weight:  [71.8 kg (158 lb 4.6 oz)-72 kg (158 lb 11.7 oz)] 72 kg (158 lb 11.7 oz) (11/18 2137)  PHYSICAL EXAMINATION: General:  Pleasant male, NAD on RA Neuro:  Awake, alert, appropriate, MAE  HEENT:  Mm moist, no JVD  Cardiovascular:  s1s2 rrr Lungs:  resps even, non labored, few scattered rhonchi L>R  Abdomen:  Soft, +bs Musculoskeletal:  Warm and dry, no edema   Recent Labs Lab 04/11/14 2108 04/12/14 0105 04/13/14 0720  NA 137 140 137  K 3.3* 3.5* 3.7  CL 93* 96 95*  CO2 27 28 23   BUN 16 20 44*  CREATININE 3.84* 4.31* 7.08*  GLUCOSE 62* 88 120*    Recent Labs Lab 04/11/14 2108 04/12/14 0105 04/13/14 0720  HGB 8.6* 8.1*  6.8*  HCT 27.1* 25.7* 20.9*  WBC 5.6 4.9 3.7*  PLT 140* 127* 128*   Ct Chest Wo Contrast  04/13/2014   CLINICAL DATA:  68 year old immunocompromised male with intermittent fever. Initial encounter. Current history of renal failure status post renal transplant and transplant project.  EXAM: CT CHEST WITHOUT CONTRAST  TECHNIQUE: Multidetector CT imaging of the chest was performed following the standard protocol without IV contrast.  COMPARISON:  Chest radiograph 04/11/2014 and earlier. Chest CT 04/07/2014  FINDINGS: Improved lung volumes. Decreased dependent atelectasis in both lungs, however, confluent peribronchovascular opacity persists in both lower lobes in the lingula, with central air bronchograms. See series 3. The right middle lobe is spared. The right upper lobe is spared. The superior aspect of the left upper lobe is spared.  Only trace associated left pleural fluid. No pericardial effusion. Stable cardiomegaly. No hilar or mediastinal lymphadenopathy. No axillary lymphadenopathy.  Gynecomastia. Stable visualized upper abdominal viscera. Aortic ectasia and calcified atherosclerosis re- identified. Coronary artery stents and atherosclerosis. Sequelae of median sternotomy. Right subclavian venous vascular stent re- identified.  No acute osseous abnormality identified.  IMPRESSION: 1. Improved lung volumes with resolved dependent atelectasis, but persistent confluent bilateral lower lobe and lingula peribronchovascular consolidation. Favor bronchopneumonia in this setting. Noninfectious considerations such as post transplant lymphoproliferative disorder are felt less likely. 2. Trace left pleural fluid. 3. Otherwise stable chest and upper abdominal viscera compared to  04/07/2014.   Electronically Signed   By: Lars Pinks M.D.   On: 04/13/2014 06:00    ASSESSMENT / PLAN:  Recurrent/Persistent (?) HCAP - persistent infiltrates, fever.  -  Multiple possible etiologies in immunocompromised pt with  persistent infiltrates/HCAP including atypical infection, PCP, MAC/MAI, Fungal, TB, etc. Has hx fungal PNA in past.  Previous w/u (by ID) has been negative for HIV, EBV, CMV, RPR, flu, Hepatitis, crytpo. Also need to consider non-infectious causes of persistent infiltrates such as autoimmune (especially in the setting of elevated sed rate with chronic steroid use), pneumonitis and malignancy.  >ANA negative >RF 17 (elevated) >CRP elevated   PLAN -  Urine strep, legionella pending ANA, RF pending  Consider addition full RVP (flu PCR neg)  Plan for FOB today at 11am for BAL and possible biopsy  May need to consider further w/u for other etiologies - ie ?f/u 2D echo, etc with continued fever and relative lack of respiratory s/s   Baltazar Apo, MD, PhD 04/14/2014, 8:59 AM [facility] [phone] or if no answer [phone]

## 2014-04-15 ENCOUNTER — Encounter (HOSPITAL_COMMUNITY): Payer: Self-pay | Admitting: Emergency Medicine

## 2014-04-15 DIAGNOSIS — Z940 Kidney transplant status: Secondary | ICD-10-CM

## 2014-04-15 DIAGNOSIS — Z9889 Other specified postprocedural states: Secondary | ICD-10-CM | POA: Insufficient documentation

## 2014-04-15 DIAGNOSIS — N184 Chronic kidney disease, stage 4 (severe): Secondary | ICD-10-CM

## 2014-04-15 LAB — GLUCOSE, CAPILLARY
GLUCOSE-CAPILLARY: 70 mg/dL (ref 70–99)
Glucose-Capillary: 114 mg/dL — ABNORMAL HIGH (ref 70–99)
Glucose-Capillary: 166 mg/dL — ABNORMAL HIGH (ref 70–99)
Glucose-Capillary: 180 mg/dL — ABNORMAL HIGH (ref 70–99)

## 2014-04-15 LAB — RENAL FUNCTION PANEL
ALBUMIN: 2.1 g/dL — AB (ref 3.5–5.2)
Anion gap: 16 — ABNORMAL HIGH (ref 5–15)
BUN: 29 mg/dL — AB (ref 6–23)
CO2: 23 meq/L (ref 19–32)
CREATININE: 6.03 mg/dL — AB (ref 0.50–1.35)
Calcium: 8.8 mg/dL (ref 8.4–10.5)
Chloride: 99 mEq/L (ref 96–112)
GFR calc Af Amer: 10 mL/min — ABNORMAL LOW (ref >90)
GFR calc non Af Amer: 9 mL/min — ABNORMAL LOW (ref >90)
Glucose, Bld: 67 mg/dL — ABNORMAL LOW (ref 70–99)
Phosphorus: 2.3 mg/dL (ref 2.3–4.6)
Potassium: 4.1 mEq/L (ref 3.7–5.3)
Sodium: 138 mEq/L (ref 137–147)

## 2014-04-15 LAB — CBC
HCT: 29.6 % — ABNORMAL LOW (ref 39.0–52.0)
Hemoglobin: 9.4 g/dL — ABNORMAL LOW (ref 13.0–17.0)
MCH: 29.2 pg (ref 26.0–34.0)
MCHC: 31.8 g/dL (ref 30.0–36.0)
MCV: 91.9 fL (ref 78.0–100.0)
Platelets: 163 10*3/uL (ref 150–400)
RBC: 3.22 MIL/uL — ABNORMAL LOW (ref 4.22–5.81)
RDW: 17.2 % — AB (ref 11.5–15.5)
WBC: 4 10*3/uL (ref 4.0–10.5)

## 2014-04-15 MED ORDER — PREDNISONE 10 MG PO TABS: 10.0000 mg | ORAL_TABLET | Freq: Every day | ORAL | Status: DC

## 2014-04-15 MED ORDER — DOXERCALCIFEROL 4 MCG/2ML IV SOLN
INTRAVENOUS | Status: AC
  Filled 2014-04-15: qty 2

## 2014-04-15 MED ORDER — CARVEDILOL 6.25 MG PO TABS: 6.2500 mg | ORAL_TABLET | Freq: Every day | ORAL | Status: AC

## 2014-04-15 NOTE — Care Management Note (Signed)
CARE MANAGEMENT NOTE 04/15/2014  Patient:  Brandon Sharp, Brandon Sharp   Account Number:  [id number]  Date Initiated:  04/15/2014  Documentation initiated by:  Alejo Beamer  Subjective/Objective Assessment:   CM following for progression and d/c planning. Pt lives at home with his wife     Action/Plan:   04/15/2014 Await bronch results, MRI done.   Anticipated DC Date:  04/20/2014   Anticipated DC Plan:  [place]         Choice offered to / List presented to:             Status of service:  In process, will continue to follow Medicare Important Message given?  YES (If response is "NO", the following Medicare IM given date fields will be blank) Date Medicare IM given:  04/15/2014 Medicare IM given by:  Keturah Yerby Date Additional Medicare IM given:   Additional Medicare IM given by:    Discharge Disposition:    Per UR Regulation:    If discussed at Long Length of Stay Meetings, dates discussed:    Comments:

## 2014-04-15 NOTE — Progress Notes (Signed)
Subjective:   Feels well, wants to go home.   Objective Filed Vitals:   04/15/14 0800 04/15/14 0830 04/15/14 0900 04/15/14 1000  BP: 154/60 130/98 120/43 132/47  Pulse: 85 79 83 79  Temp:      TempSrc:      Resp: 13 14 13 12   Height:      Weight:      SpO2:       Physical Exam General: alert and oriented. No acute distress. On HD Heart: RRR Lungs: CTA, unlabored Abdomen: soft, nontender +BS  Extremities: no edema  Dialysis Access: R AVF patent on HD  Dialysis Orders: MWF @ [facility] 4 hr 71.5kgs 2K/2.25ca 400/1.5 3000 Heparin R AVF hectorol 59mcg IV/HD Aranesp 100 Venofer- none  Assessment/Plan: 1. Recurrent PNA / pulm infiltrates / fever- Immunocompromised on Meds for Heart TX/ Afebrile LML/LLL nodular infiltrates. ID and Pulm Following. Bronch 11/19- biopsy pending. OP Bld cul. At [facility] cent.04/11/14 Neg. and in ED- no growth. Currently off Vanc and cefepime  2. ESRD - MWF @ [facility]. K+ 4.1 HD on schedule 3. Hypertension/volume - Am bp =132/47. On coreg 6.25mg  hs 4. Anemia - hgb 9.4 cont Aranesp 100. Transfused 2u RBC 11/18 (hgb 6.8). Stool cards pending. No Fe, last tsat 29 5. Metabolic bone disease - BW+62.0 corrected/phos 4.4 ,continue phoslo and Hold 54mcg hectorol . Last PTH 215. 6. Nutrition - alb 2.1. Renal diet. multivit 7. DM- per primary 8. Heart transplant - imuran, [place]amune, prednisone  Shelle Iron, NP [facility] Beeper [phone] 04/15/2014,10:24 AM  LOS: 4 days   Pt seen, examined and agree w A/P as above.  Kelly Splinter MD pager [phone]    cell [phone] 04/15/2014, 11:40 AM    Additional Objective Labs: Basic Metabolic Panel:  Recent Labs Lab 04/12/14 0105 04/13/14 0720 04/15/14 0750  NA 140 137 138  K 3.5* 3.7 4.1  CL 96 95* 99  CO2 28 23 23   GLUCOSE 88 120* 67*  BUN 20 44* 29*  CREATININE 4.31* 7.08* 6.03*  CALCIUM 8.1* 8.6 8.8  PHOS  --  4.4 2.3   Liver Function Tests:  Recent Labs Lab  04/11/14 2108 04/12/14 0105 04/13/14 0720 04/15/14 0750  AST 31 26  --   --   ALT 21 18  --   --   ALKPHOS 54 48  --   --   BILITOT 0.3 0.3  --   --   PROT 6.4 5.7*  --   --   ALBUMIN 2.3* 2.2* 2.1* 2.1*   No results for input(s): LIPASE, AMYLASE in the last 168 hours. CBC:  Recent Labs Lab 04/11/14 2108 04/12/14 0105 04/13/14 0720 04/14/14 0850 04/15/14 0750  WBC 5.6 4.9 3.7* 3.0* 4.0  NEUTROABS 3.7 3.1  --   --   --   HGB 8.6* 8.1* 6.8* 9.7* 9.4*  HCT 27.1* 25.7* 20.9* 30.2* 29.6*  MCV 95.8 96.3 94.1 94.1 91.9  PLT 140* 127* 128* 133* 163   Blood Culture    Component Value Date/Time   SDES BRONCHIAL ALVEOLAR LAVAGE 04/14/2014 1201   SPECREQUEST Immunocompromised 04/14/2014 1201   CULT  04/14/2014 1201    Culture reincubated for better growth Performed at [facility] PENDING 04/14/2014 1201    Cardiac Enzymes: No results for input(s): CKTOTAL, CKMB, CKMBINDEX, TROPONINI in the last 168 hours. CBG:  Recent Labs Lab 04/14/14 1434 04/14/14 1713 04/14/14 2021 04/15/14 0016 04/15/14 0418  GLUCAP 85 120* 166* 180*  114*   Iron Studies: No results for input(s): IRON, TIBC, TRANSFERRIN, FERRITIN in the last 72 hours. @lablastinr3 @ Studies/Results: Mr Brain Wo Contrast  04/14/2014   CLINICAL DATA:  68 year old male with immunosuppression following organ transplantation presents with symptoms of fever, malaise, and encephalopathy. Initial encounter.  EXAM: MRI HEAD WITHOUT CONTRAST  TECHNIQUE: Multiplanar, multiecho pulse sequences of the brain and surrounding structures were obtained without intravenous contrast.  COMPARISON:  CT head 04/06/2014.  MR head 05/15/2003.  FINDINGS: There is a 1 cm sized area of diffusion restriction in the RIGHT occipital subcortical white matter with slight associated T2 and FLAIR prolongation. No other similar lesions. No acute hemorrhage is present, although is small focus chronic hemorrhage was noted in 2004 in  the RIGHT posterior temporal subcortical white matter, with additional tiny foci of chronic hemorrhage in the RIGHT temporal and LEFT frontal cortex.  Generalized atrophy. Multiple BILATERAL chronic cerebellar infarcts. BILATERAL deep nuclei chronic lacunar infarcts. Minor changes of subcortical and periventricular white matter signal abnormality, likely chronic microvascular ischemic change.  Normal pituitary and cerebellar tonsils. Advanced cervical spondylosis with suspected slight cord compression.  Flow voids are maintained in the carotid, basilar, and vertebral arteries.  Abnormalities of the extracranial soft tissues include significant fluid accumulation with probable retention cyst formation the LEFT maxillary sinus causing expansion, and BILATERAL T2 hyperintense fluid in the mastoid air cells, favored to represent benign effusions. No sinus air-fluid level. Negative orbits. Extracranial soft tissues unremarkable.  No contrast was given in this patient renal failure, but there are no obvious features of pachymeningeal thickening or sulcal FLAIR hyperintensity to suggest meningitis.  IMPRESSION: Nonspecific 1 cm focus of restricted diffusion in the RIGHT occipital subcortical white matter. The patient has evidence for multiple remote areas of infarction and certainly this is a leading differential consideration.  In the setting of immunosuppression, with the patient on Imuran, neurotoxicity secondary to this drug is associated with abnormal diffusion signal in the posterior circulation, including occipital cortex and white matter. Similar findings were seen on prior MR from 2004, but were more extensive at that time.  Cerebritis or brain abscess, well also associated with restricted diffusion, is not favored based on lesion morphology and lack of surrounding vasogenic edema. Intracranial infection is not favored as a cause of the patient's fever. In addition, LEFT maxillary sinus disease and BILATERAL  mastoid effusions are likely non-worrisome.   Electronically Signed   By: Rolla Flatten M.D.   On: 04/14/2014 15:44   Dg Chest Port 1 View  04/14/2014   CLINICAL DATA:  Status post left bronchoscopy lung biopsy.  EXAM: PORTABLE CHEST - 1 VIEW  COMPARISON:  04/11/2014  FINDINGS: Persistent bibasilar pulmonary airspace opacity again seen, left side greater than right. No evidence of pneumothorax. Cardiomegaly is stable. Bilateral subclavian stents again seen in place. Patient has undergone previous median sternotomy.  IMPRESSION: No significant change in bibasilar airspace disease and cardiomegaly. No pneumothorax identified.   Electronically Signed   By: Earle Gell M.D.   On: 04/14/2014 12:32   Dg C-arm Bronchoscopy  04/14/2014   CLINICAL DATA:    C-ARM BRONCHOSCOPY  Fluoroscopy was utilized by the requesting physician.  No radiographic  interpretation.    Medications:   . sodium chloride   Intravenous Once  . aspirin EC  81 mg Oral Daily  . atorvastatin  40 mg Oral Daily  . azaTHIOprine  75 mg Oral q morning - 10a  . calcium acetate  667 mg Oral TID  WC  . carvedilol  6.25 mg Oral QHS  . colesevelam  3,750 mg Oral Daily  . darbepoetin (ARANESP) injection - DIALYSIS  100 mcg Intravenous Q Wed-HD  . doxercalciferol  2 mcg Intravenous Q M,W,F-HD  . famotidine  10 mg Oral QHS  . heparin  5,000 Units Subcutaneous 3 times per day  . insulin aspart  0-15 Units Subcutaneous 6 times per day  . insulin glargine  20 Units Subcutaneous q morning - 10a  . isosorbide dinitrate  40 mg Oral TID  . multivitamin  1 tablet Oral QHS  . predniSONE  10 mg Oral Q breakfast  . sirolimus  2 mg Oral Daily

## 2014-04-15 NOTE — Progress Notes (Signed)
PCCM Interval Note  Pt s/p FOB, BAL and biopsies. Studies are all pending.  Will follow up with him on 12//1/15 at 12:00  Baltazar Apo, MD, PhD 04/15/2014, 1:27 PM [facility] [phone] or if no answer [phone]

## 2014-04-15 NOTE — Progress Notes (Signed)
[facility]    Date of Admission:  04/11/2014   Total days of antibiotics 4           ID: Brandon [facility] is a 68 y.o. 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 With FUO, repeated hospital admissions treated for HCAP. Chest CT showing persistent confluent bilateral lower lobe and lingula consolidation  Principal Problem:   Fever of unknown origin (FUO) Active Problems:   Heart transplanted   DM (diabetes mellitus), type 2 with renal complications   HCAP (healthcare-associated pneumonia)   ESRD on dialysis   Immunosuppressed status   Healthcare associated bacterial pneumonia   Fever    Subjective: Afebrile x 3 days, initially started on vanco and cefepime. Underwent bronchoscopy and brain mri yesterday  Medications:  . sodium chloride   Intravenous Once  . aspirin EC  81 mg Oral Daily  . atorvastatin  40 mg Oral Daily  . azaTHIOprine  75 mg Oral q morning - 10a  . calcium acetate  667 mg Oral TID WC  . carvedilol  6.25 mg Oral QHS  . colesevelam  3,750 mg Oral Daily  . darbepoetin (ARANESP) injection - DIALYSIS  100 mcg Intravenous Q Wed-HD  . doxercalciferol  2 mcg Intravenous Q M,W,F-HD  . famotidine  10 mg Oral QHS  . heparin  5,000 Units Subcutaneous 3 times per day  . insulin aspart  0-15 Units Subcutaneous 6 times per day  . insulin glargine  20 Units Subcutaneous q morning - 10a  . isosorbide dinitrate  40 mg Oral TID  . multivitamin  1 tablet Oral QHS  . predniSONE  10 mg Oral Q breakfast  . sirolimus  2 mg Oral Daily    Objective: Vital signs in last 24 hours: Temp:  [97.4 F (36.3 C)-99.8 F (37.7 C)] 97.9 F (36.6 C) (11/20 1208) Pulse Rate:  [76-90] 86 (11/20 1208) Resp:  [10-20] 13 (11/20 1208) BP: (106-154)/(42-98) 139/63 mmHg (11/20 1208) SpO2:  [95 %-98 %] 98 % (11/20 1208) Weight:  [157 lb 3 oz (71.3  kg)-162 lb 11.2 oz (73.8 kg)] 157 lb 3 oz (71.3 kg) (11/20 1208) Did not examine  Lab Results  Recent Labs  04/13/14 0720 04/14/14 0850 04/15/14 0750  WBC 3.7* 3.0* 4.0  HGB 6.8* 9.7* 9.4*  HCT 20.9* 30.2* 29.6*  NA 137  --  138  K 3.7  --  4.1  CL 95*  --  99  CO2 23  --  23  BUN 44*  --  29*  CREATININE 7.08*  --  6.03*   Liver Panel  Recent Labs  04/13/14 0720 04/15/14 0750  ALBUMIN 2.1* 2.1*   Sedimentation Rate  Recent Labs  04/12/14 1710  ESRSEDRATE 130*   C-Reactive Protein  Recent Labs  04/12/14 1710  CRP 18.3*    Microbiology: 11/16 blood cx ngtd 11/19 BAL cx pending  Studies/Results: Mr Brain Wo Contrast  04/14/2014   CLINICAL DATA:  68 year old male with immunosuppression following organ transplantation presents with symptoms of fever, malaise, and encephalopathy. Initial encounter.  EXAM: MRI HEAD WITHOUT CONTRAST  TECHNIQUE: Multiplanar, multiecho pulse sequences of the brain and surrounding structures were obtained without intravenous contrast.  COMPARISON:  CT head 04/06/2014.  MR head 05/15/2003.  FINDINGS: There is a 1 cm sized area of diffusion restriction  in the RIGHT occipital subcortical white matter with slight associated T2 and FLAIR prolongation. No other similar lesions. No acute hemorrhage is present, although is small focus chronic hemorrhage was noted in 2004 in the RIGHT posterior temporal subcortical white matter, with additional tiny foci of chronic hemorrhage in the RIGHT temporal and LEFT frontal cortex.  Generalized atrophy. Multiple BILATERAL chronic cerebellar infarcts. BILATERAL deep nuclei chronic lacunar infarcts. Minor changes of subcortical and periventricular white matter signal abnormality, likely chronic microvascular ischemic change.  Normal pituitary and cerebellar tonsils. Advanced cervical spondylosis with suspected slight cord compression.  Flow voids are maintained in the carotid, basilar, and vertebral arteries.   Abnormalities of the extracranial soft tissues include significant fluid accumulation with probable retention cyst formation the LEFT maxillary sinus causing expansion, and BILATERAL T2 hyperintense fluid in the mastoid air cells, favored to represent benign effusions. No sinus air-fluid level. Negative orbits. Extracranial soft tissues unremarkable.  No contrast was given in this patient renal failure, but there are no obvious features of pachymeningeal thickening or sulcal FLAIR hyperintensity to suggest meningitis.  IMPRESSION: Nonspecific 1 cm focus of restricted diffusion in the RIGHT occipital subcortical white matter. The patient has evidence for multiple remote areas of infarction and certainly this is a leading differential consideration.  In the setting of immunosuppression, with the patient on Imuran, neurotoxicity secondary to this drug is associated with abnormal diffusion signal in the posterior circulation, including occipital cortex and white matter. Similar findings were seen on prior MR from 2004, but were more extensive at that time.  Cerebritis or brain abscess, well also associated with restricted diffusion, is not favored based on lesion morphology and lack of surrounding vasogenic edema. Intracranial infection is not favored as a cause of the patient's fever. In addition, LEFT maxillary sinus disease and BILATERAL mastoid effusions are likely non-worrisome.   Electronically Signed   By: Rolla Flatten M.D.   On: 04/14/2014 15:44   Dg Chest Port 1 View  04/14/2014   CLINICAL DATA:  Status post left bronchoscopy lung biopsy.  EXAM: PORTABLE CHEST - 1 VIEW  COMPARISON:  04/11/2014  FINDINGS: Persistent bibasilar pulmonary airspace opacity again seen, left side greater than right. No evidence of pneumothorax. Cardiomegaly is stable. Bilateral subclavian stents again seen in place. Patient has undergone previous median sternotomy.  IMPRESSION: No significant change in bibasilar airspace disease  and cardiomegaly. No pneumothorax identified.   Electronically Signed   By: Earle Gell M.D.   On: 04/14/2014 12:32   Dg C-arm Bronchoscopy  04/14/2014   CLINICAL DATA:    C-ARM BRONCHOSCOPY  Fluoroscopy was utilized by the requesting physician.  No radiographic  interpretation.      Assessment/Plan: 68yo M immunocompromised host due to heart transplant and kidney transplant, failed now on HD with intermittent fevers. He was initially placed on vancomycin and cefepime which have been discontinued and still remains afebrile  - found to have elevated inflammatory markers, prelim auto immune work up is non revealing - brain mri found occipital changes that could be consistent with with matter toxicity associated with imuran vs. PML, which would need CSF analysis for JC virus. Not necessarily think that PML is associated with fever - underwent bronch but studies still pending. - agree with primary team to discharge home and have him follow up in pulmonary and ID in 2 wk   [place], [facility] Cell: [phone] Pager: [phone]  04/15/2014, 1:05 PM

## 2014-04-15 NOTE — Discharge Summary (Addendum)
Physician Discharge Summary  Brandon Sharp NUU:[id number] DOB: 12-09-1945 DOA: 04/11/2014  PCP: Brandon Noon, MD  Admit date: 04/11/2014 Discharge date: 04/15/2014  Time spent: 35 minutes  Recommendations for Outpatient Follow-up:  1. Follow up for bronch results 2. D/c'd on higher steroid dose-- taper as tolerated  Discharge Diagnoses:  Principal Problem:   Fever of unknown origin (FUO) Active Problems:   Heart transplanted   DM (diabetes mellitus), type 2 with renal complications   HCAP (healthcare-associated pneumonia)   ESRD on dialysis   Immunosuppressed status   Healthcare associated bacterial pneumonia   Fever   S/P bronchoscopy with biopsy   Discharge Condition: improved  Diet recommendation: cardiac  Filed Weights   04/14/14 2024 04/15/14 0745 04/15/14 1208  Weight: 73.8 kg (162 lb 11.2 oz) 73 kg (160 lb 15 oz) 71.3 kg (157 lb 3 oz)    History of present illness:  This is a 68 y.o. 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 Pt noted to have been admitted multiple times over past 1-2 months including 11/11-11/15/2015, 11/1-11/09/2013 for HCAP. ID consulted. Workup including blood and resp cultures, HIV, EBV, cryptococcal ag, acute hepatitis panel. C abd and pel negative. Abx were stopped and prednisone increased to 10 mg. Per the wife, pt has been having intermittent malaise, fatigue, weakness at home. Went to HD today. Had temp during HD that required ? Abx. Pt was sent home with instructions that if fever returned to call EMS. Per wife, pt had temp around 101-102 at home and EMS was subsequently called.  On presentation to ER, tmax 100.1, HR 90s-100s, resp 10s-20s, BP 100s, satting in mid 90s on RA, desats to upper 80s on movement/ambulation. WBC 5.6,  hgb 8.6, Cr 3.84, K 3.3, glu 62. CXR shows persistent consolidation in the left mid and lower lung and in the right lung base similar to the prior studies. Pt started on vanc and cefepime for empiric coverage. Lactate WNL. ABG pending.  Wife does report remote hx/o fungal PNA in the past.  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  Hospital Course:  Fever of unknown origin (FUO): appreciate ID, pulmonary. S/P bronch/bx, washings. RF slightly elevated. Clinical significance not clear, but has done better each time prednisone increased transiently- will increase to 10 mg. 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 Suspect yesterday's hgb of 6.8 erroneous, as after 1 unit prbc, up to 9.7 today  Procedures:  bronchoscopy  Consultations:  pulm  ID  Discharge Exam: Filed Vitals:   04/15/14 1208  BP: 139/63  Pulse: 86  Temp: 97.9 F (36.6 C)  Resp: 13    General: A+Ox3, NAD Cardiovascular: rrr Respiratory: clear  Discharge Instructions You were cared for by a hospitalist during your hospital stay. If you have any questions about your discharge medications or the care you received while you were in the hospital after you are discharged, you can call the unit and asked to speak with the hospitalist on call if the hospitalist that took care of  you is not available. Once you are discharged, your primary care physician will handle any further medical issues. Please note that NO REFILLS for any discharge medications will be authorized once you are discharged, as it is imperative that you return to your primary care physician (or establish  a relationship with a primary care physician if you do not have one) for your aftercare needs so that they can reassess your need for medications and monitor your lab values.  Discharge Instructions    Diet - low sodium heart healthy    Complete by:  As directed      Diet Carb Modified    Complete by:  As directed      Increase activity slowly    Complete by:  As directed           Current Discharge Medication List    CONTINUE these medications which have CHANGED   Details  carvedilol (COREG) 6.25 MG tablet Take 1 tablet (6.25 mg total) by mouth at bedtime. Qty: 30 tablet, Refills: 0    predniSONE (DELTASONE) 10 MG tablet Take 1 tablet (10 mg total) by mouth daily with breakfast. Qty: 30 tablet, Refills: 0      CONTINUE these medications which have NOT CHANGED   Details  aspirin EC 81 MG tablet Take 81 mg by mouth daily.      atorvastatin (LIPITOR) 40 MG tablet Take 40 mg by mouth daily.      azaTHIOprine (IMURAN) 50 MG tablet Take 75 mg by mouth every morning.     b complex-vitamin c-folic acid (NEPHRO-VITE) 0.8 MG TABS tablet Take 1 tablet by mouth at bedtime.    calcium acetate (PHOSLO) 667 MG capsule Take 667 mg by mouth 3 (three) times daily with meals.     colesevelam (WELCHOL) 625 MG tablet Take 3,750 mg by mouth daily. Takes 6 tabs daily    famotidine (PEPCID AC) 10 MG chewable tablet Chew 10 mg by mouth at bedtime.     HUMALOG KWIKPEN 100 UNIT/ML SOPN Inject 6-10 Units into the skin 2 (two) times daily. Per sliding scale    insulin glargine (LANTUS) 100 UNIT/ML injection Inject 20 Units into the skin every morning.    isosorbide dinitrate (ISORDIL) 40 MG tablet Take 40 mg by mouth 3 (three) times daily.      sirolimus (RAPAMUNE) 2 MG tablet Take 2 mg by mouth daily.       Allergies  Allergen Reactions  . Lisinopril Swelling    Lips and tongue swell  . Niacin And Related Other (See Comments)    unknown  . Norvasc [Amlodipine Besylate] Rash    Flushing   . Penicillins Rash   Follow-up Information    Follow up with [name],[name], MD In 1 week.   Specialty:  Family Medicine   Contact information:   [place] [address] [phone]       Please follow up.   Why:  HD M/W/F      Follow up with Carlyle Basques, MD In 2 weeks.   Specialty:  Infectious Diseases   Contact information:   [address] [place] [address] [phone]       Follow up with Collene Gobble., MD On 04/26/2014.   Specialty:  Pulmonary Disease   Why:  12:00 pm   Contact information:   [address] [address] [phone]        The results of significant diagnostics from  this hospitalization (including imaging, microbiology, ancillary and laboratory) are listed below for reference.    Significant Diagnostic Studies: Dg Chest 2 View  04/06/2014   CLINICAL DATA:  Fever for 1 week.  EXAM: CHEST  2 VIEW  COMPARISON:  03/27/2014  FINDINGS: Dense airspace disease at the left base. In the setting of fever this is most consistent with pneumonia. No effusion or cavitation. There may also be mild airspace opacity at the peripheral right base. There is cardiomegaly which is stable from previous. Negative aortic and hilar contours. Status post central venous stenting.  IMPRESSION: Left lower lobe pneumonia.   Electronically Signed   By: Jorje Guild M.D.   On: 04/06/2014 20:14   Dg Chest 2 View  03/27/2014   CLINICAL DATA:  Fatigue for 2 weeks.  Loss of appetite.  Fever.  EXAM: CHEST  2 VIEW  COMPARISON:  07/28/2013 and 07/24/2013  FINDINGS: Two views of the chest again demonstrate vascular stents in the upper chest bilaterally. Again noted are patchy densities in the left lower lobe. These densities are more prominent than the previous examinations and could represent acute on chronic disease. Upper lungs are clear. Patient has median sternotomy wires. Atherosclerotic calcifications at the aortic arch. Heart  size is stable. No significant pleural effusions.  IMPRESSION: Increased patchy densities in the left lower lobe. Findings suggest acute on chronic disease.   Electronically Signed   By: Markus Daft M.D.   On: 03/27/2014 18:43   Ct Head Wo Contrast  04/06/2014   CLINICAL DATA:  Headache beginning today.  Fever.  EXAM: CT HEAD WITHOUT CONTRAST  TECHNIQUE: Contiguous axial images were obtained from the base of the skull through the vertex without intravenous contrast.  COMPARISON:  Head CT scan 07/19/2010.  FINDINGS: There is some cortical atrophy which is unchanged in appearance. No evidence of acute intracranial abnormality including infarct, hemorrhage, mass lesion, mass effect, midline shift or abnormal extra-axial fluid collection is identified. There is no hydrocephalus or pneumocephalus. The calvarium is intact. There is near complete opacification of left maxillary sinus. Ethmoid air cell disease is also seen on the left. Carotid atherosclerosis is noted.  IMPRESSION: No acute intracranial abnormality.  Near-complete opacification of the left maxillary sinus and mild left ethmoid air cell disease.   Electronically Signed   By: Inge Rise M.D.   On: 04/06/2014 21:07   Ct Chest Wo Contrast  04/13/2014   CLINICAL DATA:  68 year old immunocompromised male with intermittent fever. Initial encounter. Current history of renal failure status post renal transplant and transplant project.  EXAM: CT CHEST WITHOUT CONTRAST  TECHNIQUE: Multidetector CT imaging of the chest was performed following the standard protocol without IV contrast.  COMPARISON:  Chest radiograph 04/11/2014 and earlier. Chest CT 04/07/2014  FINDINGS: Improved lung volumes. Decreased dependent atelectasis in both lungs, however, confluent peribronchovascular opacity persists in both lower lobes in the lingula, with central air bronchograms. See series 3. The right middle lobe is spared. The right upper lobe is spared. The superior aspect  of the left upper lobe is spared.  Only trace associated left pleural fluid. No pericardial effusion. Stable cardiomegaly. No hilar or mediastinal lymphadenopathy. No axillary lymphadenopathy.  Gynecomastia. Stable visualized upper abdominal viscera. Aortic ectasia and calcified atherosclerosis re- identified. Coronary artery stents and atherosclerosis. Sequelae of median sternotomy. Right subclavian venous vascular stent re- identified.  No acute osseous abnormality identified.  IMPRESSION: 1. Improved lung volumes with resolved dependent atelectasis, but persistent confluent bilateral lower lobe and  lingula peribronchovascular consolidation. Favor bronchopneumonia in this setting. Noninfectious considerations such as post transplant lymphoproliferative disorder are felt less likely. 2. Trace left pleural fluid. 3. Otherwise stable chest and upper abdominal viscera compared to 04/07/2014.   Electronically Signed   By: Lars Pinks M.D.   On: 04/13/2014 06:00   Ct Chest W Contrast  04/07/2014   CLINICAL DATA:  Fever of unknown origin with fatigue for several weeks, immunosuppressed status, personal history of diabetes mellitus, hypertension, coronary artery disease post MI and CABG, CHF, renal failure post renal transplant and transplant rejection.  EXAM: CT CHEST, ABDOMEN, AND PELVIS WITH CONTRAST  TECHNIQUE: Multidetector CT imaging of the chest, abdomen and pelvis was performed following the standard protocol during bolus administration of intravenous contrast. Sagittal and coronal MPR images reconstructed from axial data set.  CONTRAST:  10mL OMNIPAQUE IOHEXOL 300 MG/ML SOLN IV. Dilute oral contrast.  COMPARISON:  PET-CT 09/27/2010, CT abdomen and pelvis 06/01/2008, CT chest 03/24/2008  FINDINGS: CT CHEST FINDINGS  Extensive atherosclerotic calcification of the aorta and coronary arteries with evidence of prior CABG and coronary stenting.  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   Enlargement of cardiac chambers.  No thoracic adenopathy.  Pulmonary infiltrates are identified within the lingula and BILATERAL lower lobes question pneumonia.  Remaining lungs clear.  No definite pleural effusion or pneumothorax.  Diffuse osseous demineralization.  CT ABDOMEN AND PELVIS FINDINGS  Atrophic polycystic appearing kidneys.  Intermediate attenuation cyst at inferior pole LEFT kidney image 83, 14 x 12 mm.  Several hepatic cysts up to 2.2 x 2.1 cm.  Low-attenuation lesion posterior lateral upper to mid spleen, 2.2 x 1.6 cm image 65, new.  Remainder of liver, spleen, pancreas, and adrenal glands normal.  Transplant kidney RIGHT iliac fossa without gross mass or hydronephrosis.  Scattered colonic diverticulosis without evidence of diverticulitis.  Stomach and bowel loops otherwise normal appearance.  No mass, adenopathy, free fluid or inflammatory process.  Scattered atherosclerotic calcifications.  No acute osseous findings.  IMPRESSION: Extensive atherosclerotic disease with aneurysmal dilatation of the ascending thoracic aorta 4.2 x 4.3 cm.  Post CABG and coronary artery stenting.  BILATERAL lower lobe and lingular infiltrates question pneumonia.  Cystic disease of the liver and native kidneys with unremarkable transplant kidney at RIGHT iliac fossa.  Colonic diverticulosis.  Nonspecific 2.2 x 1.6 cm diameter low-attenuation lesion within the spleen; this has a peripheral base and fills in with contrast on incomplete delayed imaging.  Differential diagnosis would include perfusion artifact, hemangioma, and mass lesion.  Remainder of exam unremarkable without identification of an acute inflammatory process.   Electronically Signed   By: Lavonia Dana M.D.   On: 04/07/2014 17:32   Mr Brain Wo Contrast  04/14/2014   CLINICAL DATA:  68 year old male with immunosuppression following organ transplantation presents with symptoms of fever, malaise, and encephalopathy. Initial encounter.  EXAM: MRI HEAD WITHOUT  CONTRAST  TECHNIQUE: Multiplanar, multiecho pulse sequences of the brain and surrounding structures were obtained without intravenous contrast.  COMPARISON:  CT head 04/06/2014.  MR head 05/15/2003.  FINDINGS: There is a 1 cm sized area of diffusion restriction in the RIGHT occipital subcortical white matter with slight associated T2 and FLAIR prolongation. No other similar lesions. No acute hemorrhage is present, although is small focus chronic hemorrhage was noted in 2004 in the RIGHT posterior temporal subcortical white matter, with additional tiny foci of chronic hemorrhage in the RIGHT temporal and LEFT frontal cortex.  Generalized atrophy.  Multiple BILATERAL chronic cerebellar infarcts. BILATERAL deep nuclei chronic lacunar infarcts. Minor changes of subcortical and periventricular white matter signal abnormality, likely chronic microvascular ischemic change.  Normal pituitary and cerebellar tonsils. Advanced cervical spondylosis with suspected slight cord compression.  Flow voids are maintained in the carotid, basilar, and vertebral arteries.  Abnormalities of the extracranial soft tissues include significant fluid accumulation with probable retention cyst formation the LEFT maxillary sinus causing expansion, and BILATERAL T2 hyperintense fluid in the mastoid air cells, favored to represent benign effusions. No sinus air-fluid level. Negative orbits. Extracranial soft tissues unremarkable.  No contrast was given in this patient renal failure, but there are no obvious features of pachymeningeal thickening or sulcal FLAIR hyperintensity to suggest meningitis.  IMPRESSION: Nonspecific 1 cm focus of restricted diffusion in the RIGHT occipital subcortical white matter. The patient has evidence for multiple remote areas of infarction and certainly this is a leading differential consideration.  In the setting of immunosuppression, with the patient on Imuran, neurotoxicity secondary to this drug is associated with  abnormal diffusion signal in the posterior circulation, including occipital cortex and white matter. Similar findings were seen on prior MR from 2004, but were more extensive at that time.  Cerebritis or brain abscess, well also associated with restricted diffusion, is not favored based on lesion morphology and lack of surrounding vasogenic edema. Intracranial infection is not favored as a cause of the patient's fever. In addition, LEFT maxillary sinus disease and BILATERAL mastoid effusions are likely non-worrisome.   Electronically Signed   By: Rolla Flatten M.D.   On: 04/14/2014 15:44   Ct Abdomen Pelvis W Contrast  04/07/2014   CLINICAL DATA:  Fever of unknown origin with fatigue for several weeks, immunosuppressed status, personal history of diabetes mellitus, hypertension, coronary artery disease post MI and CABG, CHF, renal failure post renal transplant and transplant rejection.  EXAM: CT CHEST, ABDOMEN, AND PELVIS WITH CONTRAST  TECHNIQUE: Multidetector CT imaging of the chest, abdomen and pelvis was performed following the standard protocol during bolus administration of intravenous contrast. Sagittal and coronal MPR images reconstructed from axial data set.  CONTRAST:  17mL OMNIPAQUE IOHEXOL 300 MG/ML SOLN IV. Dilute oral contrast.  COMPARISON:  PET-CT 09/27/2010, CT abdomen and pelvis 06/01/2008, CT chest 03/24/2008  FINDINGS: CT CHEST FINDINGS  Extensive atherosclerotic calcification of the aorta and coronary arteries with evidence of prior CABG and coronary stenting.  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  Enlargement of cardiac chambers.  No thoracic adenopathy.  Pulmonary infiltrates are identified within the lingula and BILATERAL lower lobes question pneumonia.  Remaining lungs clear.  No definite pleural effusion or pneumothorax.  Diffuse osseous demineralization.  CT ABDOMEN AND PELVIS FINDINGS  Atrophic polycystic appearing kidneys.  Intermediate attenuation cyst  at inferior pole LEFT kidney image 83, 14 x 12 mm.  Several hepatic cysts up to 2.2 x 2.1 cm.  Low-attenuation lesion posterior lateral upper to mid spleen, 2.2 x 1.6 cm image 65, new.  Remainder of liver, spleen, pancreas, and adrenal glands normal.  Transplant kidney RIGHT iliac fossa without gross mass or hydronephrosis.  Scattered colonic diverticulosis without evidence of diverticulitis.  Stomach and bowel loops otherwise normal appearance.  No mass, adenopathy, free fluid or inflammatory process.  Scattered atherosclerotic calcifications.  No acute osseous findings.  IMPRESSION: Extensive atherosclerotic disease with aneurysmal dilatation of the ascending thoracic aorta 4.2 x 4.3 cm.  Post CABG and coronary artery stenting.  BILATERAL lower lobe and lingular infiltrates  question pneumonia.  Cystic disease of the liver and native kidneys with unremarkable transplant kidney at RIGHT iliac fossa.  Colonic diverticulosis.  Nonspecific 2.2 x 1.6 cm diameter low-attenuation lesion within the spleen; this has a peripheral base and fills in with contrast on incomplete delayed imaging.  Differential diagnosis would include perfusion artifact, hemangioma, and mass lesion.  Remainder of exam unremarkable without identification of an acute inflammatory process.   Electronically Signed   By: Lavonia Dana M.D.   On: 04/07/2014 17:32   Dg Chest Port 1 View  04/14/2014   CLINICAL DATA:  Status post left bronchoscopy lung biopsy.  EXAM: PORTABLE CHEST - 1 VIEW  COMPARISON:  04/11/2014  FINDINGS: Persistent bibasilar pulmonary airspace opacity again seen, left side greater than right. No evidence of pneumothorax. Cardiomegaly is stable. Bilateral subclavian stents again seen in place. Patient has undergone previous median sternotomy.  IMPRESSION: No significant change in bibasilar airspace disease and cardiomegaly. No pneumothorax identified.   Electronically Signed   By: Earle Gell M.D.   On: 04/14/2014 12:32   Dg Chest  Port 1 View  04/11/2014   CLINICAL DATA:  Fever and chills.  EXAM: PORTABLE CHEST - 1 VIEW  COMPARISON:  Chest CT, 04/07/2014.  Chest radiographs, 04/06/2014.  FINDINGS: Patchy areas of left mid and lower lung consolidation and milder right lung base consolidation, this similar to the prior studies allowing for differences in technique and inspiratory volumes. No new areas of lung consolidation. No convincing pulmonary edema. No pleural effusion or pneumothorax.  Changes from cardiac surgery are stable. Cardiac silhouette is mildly enlarged. No mediastinal or hilar masses. Stable right subclavian vein stent.  IMPRESSION: Persist consolidation in the left mid and lower lung and in the right lung base similar to the prior studies. This is most suggestive of multifocal pneumonia. No new abnormalities.   Electronically Signed   By: Lajean Manes M.D.   On: 04/11/2014 20:46   Dg C-arm Bronchoscopy  04/14/2014   CLINICAL DATA:    C-ARM BRONCHOSCOPY  Fluoroscopy was utilized by the requesting physician.  No radiographic  interpretation.     Microbiology: Recent Results (from the past 240 hour(s))  Blood culture (routine x 2)     Status: None   Collection Time: 04/06/14  6:55 PM  Result Value Ref Range Status   Specimen Description BLOOD RIGHT ARM  Final   Special Requests BOTTLES DRAWN AEROBIC AND ANAEROBIC 5ML  Final   Culture  Setup Time   Final    04/07/2014 01:07 Performed at [facility]   Final    NO GROWTH 5 DAYS Performed at [facility]    Report Status 04/13/2014 FINAL  Final  Blood culture (routine x 2)     Status: None   Collection Time: 04/06/14  7:12 PM  Result Value Ref Range Status   Specimen Description BLOOD RIGHT ARM  Final   Special Requests BOTTLES DRAWN AEROBIC AND ANAEROBIC 5ML  Final   Culture  Setup Time   Final    04/07/2014 01:07 Performed at [facility]   Final    NO GROWTH 5 DAYS Performed at [facility]    Report Status 04/13/2014 FINAL  Final  MRSA PCR Screening     Status: None   Collection Time: 04/07/14  7:02 AM  Result Value Ref Range Status   MRSA by PCR NEGATIVE NEGATIVE Final    Comment:  The GeneXpert MRSA Assay (FDA approved for NASAL specimens only), is one component of a comprehensive MRSA colonization surveillance program. It is not intended to diagnose MRSA infection nor to guide or monitor treatment for MRSA infections.   Culture, blood (routine x 2)     Status: None (Preliminary result)   Collection Time: 04/11/14  9:01 PM  Result Value Ref Range Status   Specimen Description BLOOD LEFT ARM  Final   Special Requests BOTTLES DRAWN AEROBIC AND ANAEROBIC 10CC EACH  Final   Culture  Setup Time   Final    04/12/2014 01:15 Performed at [facility]    Culture   Final           BLOOD CULTURE RECEIVED NO GROWTH TO DATE CULTURE WILL BE HELD FOR 5 DAYS BEFORE ISSUING A FINAL NEGATIVE REPORT Performed at [facility]    Report Status PENDING  Incomplete  Fungus culture, blood     Status: None (Preliminary result)   Collection Time: 04/11/14  9:01 PM  Result Value Ref Range Status   Specimen Description BLOOD LEFT ARM  Final   Special Requests   Final    BOTTLES DRAWN AEROBIC AND ANAEROBIC 10CC ADDED ON 04/12/14 AT 1700   Culture   Final    NO FUNGUS ISOLATED;CULTURE IN PROGRESS FOR 7 DAYS Performed at [facility]    Report Status PENDING  Incomplete  Culture, blood (routine x 2)     Status: None (Preliminary result)   Collection Time: 04/11/14  9:07 PM  Result Value Ref Range Status   Specimen Description BLOOD PORTA CATH  Final   Special Requests BOTTLES DRAWN AEROBIC ONLY 3CC  Final   Culture  Setup Time   Final    04/12/2014 01:15 Performed at [facility]    Culture   Final           BLOOD CULTURE RECEIVED NO GROWTH TO DATE CULTURE WILL BE HELD FOR 5 DAYS BEFORE ISSUING A FINAL NEGATIVE REPORT Performed at  [facility]    Report Status PENDING  Incomplete  Culture, bal-quantitative     Status: None (Preliminary result)   Collection Time: 04/14/14 12:01 PM  Result Value Ref Range Status   Specimen Description BRONCHIAL ALVEOLAR LAVAGE  Final   Special Requests Immunocompromised  Final   Gram Stain   Final    FEW WBC PRESENT, PREDOMINANTLY PMN RARE SQUAMOUS EPITHELIAL CELLS PRESENT RARE GRAM POSITIVE COCCI IN PAIRS Performed at [facility] PENDING  Incomplete   Culture   Final    Culture reincubated for better growth Performed at [facility]    Report Status PENDING  Incomplete  Fungus Culture with Smear     Status: None (Preliminary result)   Collection Time: 04/14/14 12:01 PM  Result Value Ref Range Status   Specimen Description BRONCHIAL ALVEOLAR LAVAGE  Final   Special Requests Immunocompromised  Final   Fungal Smear   Final    NO YEAST OR FUNGAL ELEMENTS SEEN Performed at [facility]    Culture   Final    CULTURE IN PROGRESS FOR FOUR WEEKS Performed at [facility]    Report Status PENDING  Incomplete     Labs: Basic Metabolic Panel:  Recent Labs Lab 04/11/14 2108 04/12/14 0105 04/13/14 0720 04/15/14 0750  NA 137 140 137 138  K 3.3* 3.5* 3.7 4.1  CL 93* 96 95* 99  CO2 27  28 23 23   GLUCOSE 62* 88 120* 67*  BUN 16 20 44* 29*  CREATININE 3.84* 4.31* 7.08* 6.03*  CALCIUM 8.7 8.1* 8.6 8.8  PHOS  --   --  4.4 2.3   Liver Function Tests:  Recent Labs Lab 04/11/14 2108 04/12/14 0105 04/13/14 0720 04/15/14 0750  AST 31 26  --   --   ALT 21 18  --   --   ALKPHOS 54 48  --   --   BILITOT 0.3 0.3  --   --   PROT 6.4 5.7*  --   --   ALBUMIN 2.3* 2.2* 2.1* 2.1*   No results for input(s): LIPASE, AMYLASE in the last 168 hours. No results for input(s): AMMONIA in the last 168 hours. CBC:  Recent Labs Lab 04/11/14 2108 04/12/14 0105 04/13/14 0720 04/14/14 0850 04/15/14 0750  WBC 5.6 4.9 3.7*  3.0* 4.0  NEUTROABS 3.7 3.1  --   --   --   HGB 8.6* 8.1* 6.8* 9.7* 9.4*  HCT 27.1* 25.7* 20.9* 30.2* 29.6*  MCV 95.8 96.3 94.1 94.1 91.9  PLT 140* 127* 128* 133* 163   Cardiac Enzymes: No results for input(s): CKTOTAL, CKMB, CKMBINDEX, TROPONINI in the last 168 hours. BNP: BNP (last 3 results)  Recent Labs  03/27/14 1747 04/12/14 0105  PROBNP 37853.0* 45045.0*   CBG:  Recent Labs Lab 04/14/14 1713 04/14/14 2021 04/15/14 0016 04/15/14 0418 04/15/14 1303  GLUCAP 120* 166* 180* 114* 70       Signed:  Zaneta Lightcap  [facility] 04/15/2014, 1:41 PM

## 2014-04-15 NOTE — Progress Notes (Signed)
Patient discharge teaching given, including activity, diet, follow-up appoints, and medications. Patient verbalized understanding of all discharge instructions. IV access was d/c'd. Vitals are stable. Skin is intact except as charted in most recent assessments. Pt to be escorted out by RN, to be driven home by family.  Latina Frank, MBA, BS, RN 

## 2014-04-16 LAB — CULTURE, BAL-QUANTITATIVE

## 2014-04-16 LAB — CMV (CYTOMEGALOVIRUS) DNA ULTRAQUANT, PCR: CMV DNA Quant: <200 copies/mL (ref <200)

## 2014-04-16 LAB — CULTURE, BAL-QUANTITATIVE W GRAM STAIN: Colony Count: >=100000

## 2014-04-18 LAB — CULTURE, BLOOD (ROUTINE X 2)
Culture: NO GROWTH
Culture: NO GROWTH

## 2014-04-20 LAB — FUNGUS CULTURE, BLOOD: Culture: NO GROWTH

## 2014-04-26 ENCOUNTER — Ambulatory Visit (INDEPENDENT_AMBULATORY_CARE_PROVIDER_SITE_OTHER): Payer: Medicare Other | Admitting: Emergency Medicine

## 2014-04-26 ENCOUNTER — Encounter: Payer: Self-pay | Admitting: Emergency Medicine

## 2014-04-26 VITALS — BP 106/58 | HR 105 | Ht 72.0 in | Wt 163.0 lb

## 2014-04-26 DIAGNOSIS — I70219 Atherosclerosis of native arteries of extremities with intermittent claudication, unspecified extremity: Secondary | ICD-10-CM

## 2014-04-26 DIAGNOSIS — J189 Pneumonia, unspecified organism: Secondary | ICD-10-CM

## 2014-04-26 NOTE — Progress Notes (Signed)
HPI:  Mr. 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 Pt has been admitted several times in the past 6 months, with a noted history of LLL infiltrate/HCAP dating back to march 2015. Most recent admissions are 11/1 - 11/5 for HCAP, fever, weakness, nausea and vomiting, 11/11 - 11/15 for HCAP, fevers, fatigue and acute encephalopathy, and pt returned to ED 11/16 with increased fever after admission. ID has consulted on past and current admissions and to date, all work-ups have had negative results for flu, Hep A, B, C, crypto Ag, HIV, RPR, currently pending is urine strep Ag, Urine legionella, AFB, sputum culture, fungal culture and BC x2.   We performed FOB on 03/14/14 to evaluate his infiltrates.  Results > AFB, fungal negative, bacterial normal flora, biopsies negative, cytology negative  He feels better, starting to recover form the hospitalization. He has had fever - 99.8-100.8. He is cold, has chills. He is going to HD per usual schedule. His transplant meds are managed at [facility].   Past Medical History  Diagnosis Date  . Diabetes mellitus   . Hypertension   . Myocardial infarction 1985; 1990  . Angina   . Coronary artery disease   . Dysrhythmia   . DVT (deep venous thrombosis) ~ 12/2010    LLE  . Anemia   . Blood transfusion 06/1999    post heart transplant  . Peripheral vascular disease   . CHF (congestive heart failure) 2001    beffore transplant  . Renal failure     Hemodialysis MWF last 2 years, sse dr [name] nephrology, goes to [facility]  . Renal insufficiency      Family History  Problem Relation Age of Onset  . Heart disease Mother   . Hyperlipidemia Mother   . Hypertension Mother   . Hyperlipidemia Father   .  Hypertension Father   . Diabetes Brother   . Heart disease Brother     Heart Disease before age 16  . Hyperlipidemia Brother   . Hypertension Brother      History   Social History  . Marital Status: Married    Spouse Name: N/A    Number of Children: N/A  . Years of Education: N/A   Occupational History  . Not on file.   Social History Main Topics  . Smoking status: Former Smoker -- 1.00 packs/day for 20 years    Types: Cigarettes    Quit date: 05/27/1988  . Smokeless tobacco: Never Used  . Alcohol Use: Yes     Comment: "Holidays"  . Drug Use: No  . Sexual Activity: Not on file   Other Topics Concern  . Not on file   Social History Narrative     Allergies  Allergen Reactions  . Lisinopril Swelling    Lips and tongue swell  . Niacin And Related Other (See Comments)    unknown  . Norvasc [Amlodipine Besylate] Rash    Flushing  . Penicillins Rash     Outpatient Prescriptions Prior to Visit  Medication Sig Dispense Refill  . aspirin EC 81 MG tablet Take 81 mg by mouth daily.      Marland Kitchen atorvastatin (LIPITOR) 40 MG tablet Take 40 mg by mouth daily.      Marland Kitchen azaTHIOprine (IMURAN) 50 MG tablet Take  75 mg by mouth every morning.     Marland Kitchen b complex-vitamin c-folic acid (NEPHRO-VITE) 0.8 MG TABS tablet Take 1 tablet by mouth at bedtime.    . calcium acetate (PHOSLO) 667 MG capsule Take 667 mg by mouth 3 (three) times daily with meals.     . carvedilol (COREG) 6.25 MG tablet Take 1 tablet (6.25 mg total) by mouth at bedtime. 30 tablet 0  . colesevelam (WELCHOL) 625 MG tablet Take 3,750 mg by mouth daily. Takes 6 tabs daily    . famotidine (PEPCID AC) 10 MG chewable tablet Chew 10 mg by mouth at bedtime.     Marland Kitchen HUMALOG KWIKPEN 100 UNIT/ML SOPN Inject 6-10 Units into the skin 2 (two) times daily. Per sliding scale    . insulin glargine (LANTUS) 100 UNIT/ML injection Inject 20 Units into the skin every morning.    . isosorbide dinitrate (ISORDIL) 40 MG tablet Take 40 mg by mouth 3  (three) times daily.      . predniSONE (DELTASONE) 10 MG tablet Take 1 tablet (10 mg total) by mouth daily with breakfast. 30 tablet 0  . sirolimus (RAPAMUNE) 2 MG tablet Take 2 mg by mouth daily.     No facility-administered medications prior to visit.    Filed Vitals:   04/26/14 1212  BP: 106/58  Pulse: 105  Height: 6' (1.829 m)  Weight: 163 lb (73.936 kg)  SpO2: 90%   Gen: Pleasant, well-nourished, in no distress,  normal affect  ENT: No lesions,  mouth clear,  oropharynx clear, no postnasal drip  Neck: No JVD, no TMG, no carotid bruits  Lungs: No use of accessory muscles, no dullness to percussion, clear without rales or rhonchi  Cardiovascular: RRR, heart sounds normal, no murmur or gallops, no peripheral edema  Abdomen: soft and NT, no HSM,  BS normal  Musculoskeletal: No deformities, no cyanosis or clubbing  Neuro: alert, non focal  Skin: Warm, no lesions or rashes   CT chest 04/13/14 --  COMPARISON: Chest radiograph 04/11/2014 and earlier. Chest CT 04/07/2014  FINDINGS: Improved lung volumes. Decreased dependent atelectasis in both lungs, however, confluent peribronchovascular opacity persists in both lower lobes in the lingula, with central air bronchograms. See series 3. The right middle lobe is spared. The right upper lobe is spared. The superior aspect of the left upper lobe is spared.  Only trace associated left pleural fluid. No pericardial effusion. Stable cardiomegaly. No hilar or mediastinal lymphadenopathy. No axillary lymphadenopathy.  Gynecomastia. Stable visualized upper abdominal viscera. Aortic ectasia and calcified atherosclerosis re- identified. Coronary artery stents and atherosclerosis. Sequelae of median sternotomy. Right subclavian venous vascular stent re- identified.  No acute osseous abnormality identified.  IMPRESSION: 1. Improved lung volumes with resolved dependent atelectasis, but persistent confluent bilateral lower  lobe and lingula peribronchovascular consolidation. Favor bronchopneumonia in this setting. Noninfectious considerations such as post transplant lymphoproliferative disorder are felt less likely. 2. Trace left pleural fluid. 3. Otherwise stable chest and upper abdominal viscera compared to 04/07/2014.   HCAP (healthcare-associated pneumonia) Bilateral infiltrates on hospitalization November 2015 felt to be bacterial pneumonia in an immunosuppressed patient. Bronchoscopy negative on micro-and pathology. We will follow these to completion Plan to repeat his CT scan of the chest in January to look for interval improvement.

## 2014-04-26 NOTE — Patient Instructions (Signed)
We will plan to repeat your CT scan of the chest in January 2016 Follow with Dr. Lamonte Sakai in January after the CT to review

## 2014-04-26 NOTE — Assessment & Plan Note (Addendum)
Bilateral infiltrates on hospitalization November 2015 felt to be bacterial pneumonia in an immunosuppressed patient. Bronchoscopy negative on micro-and pathology. We will follow these to completion Plan to repeat his CT scan of the chest in January to look for interval improvement.

## 2014-05-03 ENCOUNTER — Encounter: Payer: Self-pay | Admitting: Internal Medicine

## 2014-05-03 ENCOUNTER — Ambulatory Visit (INDEPENDENT_AMBULATORY_CARE_PROVIDER_SITE_OTHER): Payer: Medicare Other | Admitting: Internal Medicine

## 2014-05-03 VITALS — BP 137/73 | HR 108 | Temp 98.8°F | Ht 72.0 in | Wt 158.5 lb

## 2014-05-03 DIAGNOSIS — J189 Pneumonia, unspecified organism: Secondary | ICD-10-CM

## 2014-05-03 DIAGNOSIS — I70219 Atherosclerosis of native arteries of extremities with intermittent claudication, unspecified extremity: Secondary | ICD-10-CM

## 2014-05-03 NOTE — Progress Notes (Signed)
Patient ID: Brandon Sharp, male   DOB: January 02, 1946, 68 y.o.   MRN: [id number]         [facility]  Patient Active Problem List   Diagnosis Date Noted  . S/P bronchoscopy with biopsy   . Fever   . Healthcare associated bacterial pneumonia 04/11/2014  . Immunosuppressed status   . Kidney transplant failure   . Floaters in visual field   . Fever, unknown origin 04/06/2014  . Fever of undetermined origin 04/06/2014  . Sepsis 03/30/2014  . Blood poisoning   . ESRD on dialysis   . Left lower lobe pneumonia   . Personal history of colonic polyps 12/14/2013  . HCAP (healthcare-associated pneumonia) 07/24/2013  . ESRD on hemodialysis 07/24/2013  . Diarrhea 07/24/2013  . Generalized weakness 07/24/2013  . Pain in limb- Left leg 11/24/2012  . Atherosclerosis of native arteries of the extremities with intermittent claudication 11/24/2012  . Fever of unknown origin (FUO) 04/16/2011  . HTN (hypertension), malignant 04/16/2011  . DM (diabetes mellitus), type 2 with renal complications 93/81/0175  . CKD (chronic kidney disease), stage V 04/16/2011  . DVT (deep venous thrombosis) 04/15/2011  . Coagulopathy 04/15/2011  . Heart transplanted 04/15/2011  . Renal transplant failure and rejection 04/15/2011  . Ventricular fibrillation 04/15/2011    Class: History of  . S/P CABG (coronary artery bypass graft) 04/15/2011    Class: History of  . Dyslipidemia 04/15/2011    Patient's Medications  New Prescriptions   No medications on file  Previous Medications   ASPIRIN EC 81 MG TABLET    Take 81 mg by mouth daily.     ATORVASTATIN (LIPITOR) 40 MG TABLET    Take 40 mg by mouth daily.     AZATHIOPRINE (IMURAN) 50 MG TABLET    Take 75 mg by mouth every morning.    B COMPLEX-VITAMIN C-FOLIC ACID (NEPHRO-VITE) 0.8 MG TABS TABLET    Take 1 tablet by mouth at bedtime.   CALCIUM ACETATE (PHOSLO) 667 MG CAPSULE    Take 667 mg by mouth 3 (three) times daily with meals.    CARVEDILOL (COREG) 6.25 MG TABLET    Take 1 tablet (6.25 mg total) by mouth at bedtime.   COLESEVELAM (WELCHOL) 625 MG TABLET    Take 3,750 mg by mouth daily. Takes 6 tabs daily   FAMOTIDINE (PEPCID AC) 10 MG CHEWABLE TABLET    Chew 10 mg by mouth at bedtime.    HUMALOG KWIKPEN 100 UNIT/ML SOPN    Inject 6-10 Units into the skin 2 (two) times daily. Per sliding scale   INSULIN GLARGINE (LANTUS) 100 UNIT/ML INJECTION    Inject 20 Units into the skin every morning.   ISOSORBIDE DINITRATE (ISORDIL) 40 MG TABLET    Take 40 mg by mouth 3 (three) times daily.     PREDNISONE (DELTASONE) 10 MG TABLET    Take 1 tablet (10 mg total) by mouth daily with breakfast.   SIROLIMUS (RAPAMUNE) 2 MG TABLET    Take 2 mg by mouth daily.  Modified Medications   No medications on file  Discontinued Medications   No medications on file    Subjective: Brandon Sharp is in with his wife today for his hospital follow-up visit. He has a history of a heart transplant in 2001 and is on prednisone 10 mg daily, Imuran 75 mg daily and sirolimus 2 mg daily. He has end-stage renal disease and is on hemodialysis. He was hospitalized 3 times recently  with presumed HCAP and treated with empiric broad-spectrum antibiotics. He underwent bronchoscopy on November 19. Gram stain revealed gram-positive cocci in pairs but his culture grew only normal oral flora. AFB and fungal smears were negative and AFB and fungal cultures are negative to date. His cytology was negative. CMV DNA PCR was negative. His immune gamma release assay for latent tuberculosis was negative. HIV antibody was negative as well.  He defervesced during his last hospitalization in mid November and a CT scan at that time showed his bibasilar infiltrates were improving. He denies having any fever since discharge but his wife thinks that he had some fever shortly after discharge that resolved spontaneously. He was not discharged on any antibiotics. He states that he has a little  more energy recently. He was very sedentary at baseline before entering the hospital. He is due to start physical therapy this week.  Review of Systems: Pertinent items are noted in HPI.  Past Medical History  Diagnosis Date  . Diabetes mellitus   . Hypertension   . Myocardial infarction 1985; 1990  . Angina   . Coronary artery disease   . Dysrhythmia   . DVT (deep venous thrombosis) ~ 12/2010    LLE  . Anemia   . Blood transfusion 06/1999    post heart transplant  . Peripheral vascular disease   . CHF (congestive heart failure) 2001    beffore transplant  . Renal failure     Hemodialysis MWF last 2 years, sse dr [name] nephrology, goes to [facility]  . Renal insufficiency     History  Substance Use Topics  . Smoking status: Former Smoker -- 1.00 packs/day for 20 years    Types: Cigarettes    Quit date: 05/27/1988  . Smokeless tobacco: Never Used  . Alcohol Use: Yes     Comment: "Holidays"    Family History  Problem Relation Age of Onset  . Heart disease Mother   . Hyperlipidemia Mother   . Hypertension Mother   . Hyperlipidemia Father   . Hypertension Father   . Diabetes Brother   . Heart disease Brother     Heart Disease before age 60  . Hyperlipidemia Brother   . Hypertension Brother     Allergies  Allergen Reactions  . Lisinopril Swelling    Lips and tongue swell  . Niacin And Related Other (See Comments)    unknown  . Norvasc [Amlodipine Besylate] Rash    Flushing  . Penicillins Rash    Objective: Temp: 98.8 F (37.1 C) (12/08 1405) Temp Source: Oral (12/08 1405) BP: 137/73 mmHg (12/08 1405) Pulse Rate: 108 (12/08 1405)  General: He is pleasant and in no distress seated in his wheelchair Skin: No rash Lungs: Clear Cor: Tachycardic but regular S1 and S2 with a early 1/6 systolic murmur   Assessment: He appears to be improving slowly after therapy for HCAP. He is scheduled for follow-up CT scan in early  January.  Plan: 1. Continue observation off of antibiotics 2. Follow-up in 4 weeks   Michel Bickers, MD [facility] for [place] [phone] pager   [phone] cell 05/03/2014, 2:38 PM

## 2014-05-05 ENCOUNTER — Emergency Department (HOSPITAL_COMMUNITY): Payer: Medicare Other

## 2014-05-05 ENCOUNTER — Encounter (HOSPITAL_COMMUNITY): Payer: Self-pay | Admitting: Cardiology

## 2014-05-05 ENCOUNTER — Inpatient Hospital Stay (HOSPITAL_COMMUNITY)
Admission: EM | Admit: 2014-05-05 | Discharge: 2014-05-12 | DRG: 252 | Disposition: A | Discharge status: Home / Self Care | Payer: Medicare Other | Attending: Internal Medicine | Admitting: Internal Medicine

## 2014-05-05 DIAGNOSIS — E1121 Type 2 diabetes mellitus with diabetic nephropathy: Secondary | ICD-10-CM

## 2014-05-05 DIAGNOSIS — I25811 Atherosclerosis of native coronary artery of transplanted heart without angina pectoris: Secondary | ICD-10-CM | POA: Diagnosis present

## 2014-05-05 DIAGNOSIS — I871 Compression of vein: Secondary | ICD-10-CM | POA: Diagnosis present

## 2014-05-05 DIAGNOSIS — Z880 Allergy status to penicillin: Secondary | ICD-10-CM

## 2014-05-05 DIAGNOSIS — Y830 Surgical operation with transplant of whole organ as the cause of abnormal reaction of the patient, or of later complication, without mention of misadventure at the time of the procedure: Secondary | ICD-10-CM | POA: Diagnosis present

## 2014-05-05 DIAGNOSIS — Z86718 Personal history of other venous thrombosis and embolism: Secondary | ICD-10-CM | POA: Diagnosis not present

## 2014-05-05 DIAGNOSIS — R05 Cough: Secondary | ICD-10-CM

## 2014-05-05 DIAGNOSIS — M7989 Other specified soft tissue disorders: Secondary | ICD-10-CM | POA: Diagnosis present

## 2014-05-05 DIAGNOSIS — D631 Anemia in chronic kidney disease: Secondary | ICD-10-CM | POA: Diagnosis present

## 2014-05-05 DIAGNOSIS — D696 Thrombocytopenia, unspecified: Secondary | ICD-10-CM

## 2014-05-05 DIAGNOSIS — R0902 Hypoxemia: Secondary | ICD-10-CM

## 2014-05-05 DIAGNOSIS — Z955 Presence of coronary angioplasty implant and graft: Secondary | ICD-10-CM

## 2014-05-05 DIAGNOSIS — I739 Peripheral vascular disease, unspecified: Secondary | ICD-10-CM | POA: Diagnosis present

## 2014-05-05 DIAGNOSIS — I809 Phlebitis and thrombophlebitis of unspecified site: Secondary | ICD-10-CM | POA: Diagnosis present

## 2014-05-05 DIAGNOSIS — Z87891 Personal history of nicotine dependence: Secondary | ICD-10-CM

## 2014-05-05 DIAGNOSIS — E43 Unspecified severe protein-calorie malnutrition: Secondary | ICD-10-CM | POA: Diagnosis present

## 2014-05-05 DIAGNOSIS — E1129 Type 2 diabetes mellitus with other diabetic kidney complication: Secondary | ICD-10-CM

## 2014-05-05 DIAGNOSIS — E785 Hyperlipidemia, unspecified: Secondary | ICD-10-CM | POA: Diagnosis present

## 2014-05-05 DIAGNOSIS — Z992 Dependence on renal dialysis: Secondary | ICD-10-CM | POA: Diagnosis not present

## 2014-05-05 DIAGNOSIS — T8612 Kidney transplant failure: Secondary | ICD-10-CM | POA: Diagnosis present

## 2014-05-05 DIAGNOSIS — R911 Solitary pulmonary nodule: Secondary | ICD-10-CM | POA: Diagnosis present

## 2014-05-05 DIAGNOSIS — G9340 Encephalopathy, unspecified: Secondary | ICD-10-CM | POA: Diagnosis present

## 2014-05-05 DIAGNOSIS — R509 Fever, unspecified: Secondary | ICD-10-CM

## 2014-05-05 DIAGNOSIS — I252 Old myocardial infarction: Secondary | ICD-10-CM | POA: Diagnosis not present

## 2014-05-05 DIAGNOSIS — R059 Cough, unspecified: Secondary | ICD-10-CM

## 2014-05-05 DIAGNOSIS — N2581 Secondary hyperparathyroidism of renal origin: Secondary | ICD-10-CM | POA: Diagnosis present

## 2014-05-05 DIAGNOSIS — Z888 Allergy status to other drugs, medicaments and biological substances status: Secondary | ICD-10-CM

## 2014-05-05 DIAGNOSIS — Z79899 Other long term (current) drug therapy: Secondary | ICD-10-CM | POA: Diagnosis not present

## 2014-05-05 DIAGNOSIS — Z7982 Long term (current) use of aspirin: Secondary | ICD-10-CM | POA: Diagnosis not present

## 2014-05-05 DIAGNOSIS — N186 End stage renal disease: Secondary | ICD-10-CM

## 2014-05-05 DIAGNOSIS — Z941 Heart transplant status: Secondary | ICD-10-CM | POA: Diagnosis not present

## 2014-05-05 DIAGNOSIS — E1122 Type 2 diabetes mellitus with diabetic chronic kidney disease: Secondary | ICD-10-CM | POA: Diagnosis present

## 2014-05-05 DIAGNOSIS — M79629 Pain in unspecified upper arm: Secondary | ICD-10-CM

## 2014-05-05 DIAGNOSIS — Z951 Presence of aortocoronary bypass graft: Secondary | ICD-10-CM

## 2014-05-05 DIAGNOSIS — J159 Unspecified bacterial pneumonia: Secondary | ICD-10-CM | POA: Diagnosis present

## 2014-05-05 DIAGNOSIS — T82858A Stenosis of vascular prosthetic devices, implants and grafts, initial encounter: Secondary | ICD-10-CM | POA: Diagnosis present

## 2014-05-05 DIAGNOSIS — M79606 Pain in leg, unspecified: Secondary | ICD-10-CM

## 2014-05-05 DIAGNOSIS — I120 Hypertensive chronic kidney disease with stage 5 chronic kidney disease or end stage renal disease: Secondary | ICD-10-CM | POA: Diagnosis present

## 2014-05-05 DIAGNOSIS — R410 Disorientation, unspecified: Secondary | ICD-10-CM

## 2014-05-05 DIAGNOSIS — I82C11 Acute embolism and thrombosis of right internal jugular vein: Secondary | ICD-10-CM | POA: Diagnosis present

## 2014-05-05 LAB — CBC WITH DIFFERENTIAL/PLATELET
Basophils Absolute: 0 10*3/uL (ref 0.0–0.1)
Basophils Relative: 0 % (ref 0–1)
Eosinophils Absolute: 0.1 10*3/uL (ref 0.0–0.7)
Eosinophils Relative: 2 % (ref 0–5)
HCT: 29.8 % — ABNORMAL LOW (ref 39.0–52.0)
HEMOGLOBIN: 9.1 g/dL — AB (ref 13.0–17.0)
Lymphocytes Relative: 32 % (ref 12–46)
Lymphs Abs: 1.4 10*3/uL (ref 0.7–4.0)
MCH: 28.3 pg (ref 26.0–34.0)
MCHC: 30.5 g/dL (ref 30.0–36.0)
MCV: 92.5 fL (ref 78.0–100.0)
MONOS PCT: 8 % (ref 3–12)
Monocytes Absolute: 0.3 10*3/uL (ref 0.1–1.0)
NEUTROS ABS: 2.6 10*3/uL (ref 1.7–7.7)
NEUTROS PCT: 58 % (ref 43–77)
Platelets: 121 10*3/uL — ABNORMAL LOW (ref 150–400)
RBC: 3.22 MIL/uL — AB (ref 4.22–5.81)
RDW: 16.5 % — ABNORMAL HIGH (ref 11.5–15.5)
WBC: 4.5 10*3/uL (ref 4.0–10.5)

## 2014-05-05 LAB — COMPREHENSIVE METABOLIC PANEL
ALT: 9 U/L (ref 0–53)
ANION GAP: 14 (ref 5–15)
AST: 28 U/L (ref 0–37)
Albumin: 2.3 g/dL — ABNORMAL LOW (ref 3.5–5.2)
Alkaline Phosphatase: 56 U/L (ref 39–117)
BILIRUBIN TOTAL: 0.4 mg/dL (ref 0.3–1.2)
BUN: 17 mg/dL (ref 6–23)
CO2: 30 mEq/L (ref 19–32)
Calcium: 8.6 mg/dL (ref 8.4–10.5)
Chloride: 93 mEq/L — ABNORMAL LOW (ref 96–112)
Creatinine, Ser: 4.92 mg/dL — ABNORMAL HIGH (ref 0.50–1.35)
GFR calc Af Amer: 13 mL/min — ABNORMAL LOW (ref >90)
GFR calc non Af Amer: 11 mL/min — ABNORMAL LOW (ref >90)
GLUCOSE: 143 mg/dL — AB (ref 70–99)
Potassium: 2.9 mEq/L — CL (ref 3.7–5.3)
Sodium: 137 mEq/L (ref 137–147)
Total Protein: 5.9 g/dL — ABNORMAL LOW (ref 6.0–8.3)

## 2014-05-05 LAB — I-STAT CG4 LACTIC ACID, ED: LACTIC ACID, VENOUS: 2.24 mmol/L — AB (ref 0.5–2.2)

## 2014-05-05 MED ORDER — AZATHIOPRINE 50 MG PO TABS
75.0000 mg | ORAL_TABLET | Freq: Every morning | ORAL | Status: DC
  Administered 2014-05-06 – 2014-05-12 (×7): 75 mg via ORAL
  Filled 2014-05-05 (×7): qty 2

## 2014-05-05 MED ORDER — ATORVASTATIN CALCIUM 40 MG PO TABS
40.0000 mg | ORAL_TABLET | Freq: Every day | ORAL | Status: DC
  Administered 2014-05-06 – 2014-05-12 (×7): 40 mg via ORAL
  Filled 2014-05-05 (×7): qty 1

## 2014-05-05 MED ORDER — CALCIUM ACETATE 667 MG PO CAPS
667.0000 mg | ORAL_CAPSULE | Freq: Three times a day (TID) | ORAL | Status: DC
  Administered 2014-05-06 – 2014-05-12 (×15): 667 mg via ORAL
  Filled 2014-05-05 (×23): qty 1

## 2014-05-05 MED ORDER — CARVEDILOL 6.25 MG PO TABS
6.2500 mg | ORAL_TABLET | Freq: Every day | ORAL | Status: DC
  Administered 2014-05-06 – 2014-05-11 (×7): 6.25 mg via ORAL
  Filled 2014-05-05 (×9): qty 1

## 2014-05-05 MED ORDER — SIROLIMUS 1 MG PO TABS
2.0000 mg | ORAL_TABLET | Freq: Every day | ORAL | Status: DC
  Administered 2014-05-06 – 2014-05-12 (×7): 2 mg via ORAL
  Filled 2014-05-05 (×7): qty 2

## 2014-05-05 MED ORDER — PREDNISONE 10 MG PO TABS
10.0000 mg | ORAL_TABLET | Freq: Every day | ORAL | Status: DC
  Administered 2014-05-06 – 2014-05-12 (×7): 10 mg via ORAL
  Filled 2014-05-05 (×10): qty 1

## 2014-05-05 MED ORDER — POTASSIUM CHLORIDE CRYS ER 20 MEQ PO TBCR
40.0000 meq | EXTENDED_RELEASE_TABLET | Freq: Once | ORAL | Status: AC
  Administered 2014-05-06: 40 meq via ORAL
  Filled 2014-05-05: qty 2

## 2014-05-05 MED ORDER — ACETAMINOPHEN 325 MG PO TABS
650.0000 mg | ORAL_TABLET | Freq: Four times a day (QID) | ORAL | Status: DC | PRN
  Administered 2014-05-06: 650 mg via ORAL
  Filled 2014-05-05 (×2): qty 2

## 2014-05-05 MED ORDER — ACETAMINOPHEN 650 MG RE SUPP: 650.0000 mg | Freq: Four times a day (QID) | RECTAL | Status: DC | PRN

## 2014-05-05 MED ORDER — ASPIRIN EC 81 MG PO TBEC
81.0000 mg | DELAYED_RELEASE_TABLET | Freq: Every day | ORAL | Status: DC
Start: 2014-05-06 — End: 2014-05-12
  Administered 2014-05-06 – 2014-05-12 (×7): 81 mg via ORAL
  Filled 2014-05-05 (×7): qty 1

## 2014-05-05 MED ORDER — HEPARIN SODIUM (PORCINE) 5000 UNIT/ML IJ SOLN
5000.0000 [IU] | Freq: Three times a day (TID) | INTRAMUSCULAR | Status: DC
  Filled 2014-05-05 (×4): qty 1

## 2014-05-05 MED ORDER — FAMOTIDINE 10 MG PO TABS
10.0000 mg | ORAL_TABLET | Freq: Every day | ORAL | Status: DC
  Administered 2014-05-06 – 2014-05-11 (×7): 10 mg via ORAL
  Filled 2014-05-05 (×9): qty 1

## 2014-05-05 MED ORDER — ISOSORBIDE DINITRATE 20 MG PO TABS
40.0000 mg | ORAL_TABLET | Freq: Three times a day (TID) | ORAL | Status: DC
Start: 2014-05-06 — End: 2014-05-12
  Administered 2014-05-06 – 2014-05-12 (×15): 40 mg via ORAL
  Filled 2014-05-05 (×23): qty 2

## 2014-05-05 MED ORDER — RENA-VITE PO TABS
1.0000 | ORAL_TABLET | Freq: Every day | ORAL | Status: DC
  Administered 2014-05-06 – 2014-05-11 (×7): 1 via ORAL
  Filled 2014-05-05 (×9): qty 1

## 2014-05-05 MED ORDER — COLESEVELAM HCL 625 MG PO TABS
1875.0000 mg | ORAL_TABLET | Freq: Two times a day (BID) | ORAL | Status: DC
  Administered 2014-05-06 – 2014-05-12 (×12): 1875 mg via ORAL
  Filled 2014-05-05 (×17): qty 3

## 2014-05-05 NOTE — H&P (Signed)
[facility] History and Physical  Brandon Sharp TKP:[id number] DOB: Sep 20, 1945    PCP:   [name], MD   Chief Complaint: Another spike of temperature of 102.  HPI: Brandon Sharp is an 68 y.o. 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  His prior work up also included blood and resp cultures, HIV, EBV, cryptococcal ag, acute hepatitis panel. C.  Work up [employer] included no leukocytosis, CXR with persistent though improved infiltrates, and K of 2.9 with Cr of 4.9.  Hospitalist was asked to admit him for persistent FUO.  Rewiew of Systems:  Constitutional: Negative for malaise, fever and chills. No significant weight loss or weight gain Eyes: Negative for eye pain, redness and discharge, diplopia, visual changes, or flashes of light. ENMT: Negative for ear pain, hoarseness, nasal congestion, sinus pressure and sore throat. No headaches; tinnitus, drooling, or problem swallowing. Cardiovascular: Negative for chest pain, palpitations, diaphoresis, dyspnea and peripheral edema. ; No orthopnea, PND Respiratory: Negative for cough, hemoptysis, wheezing and stridor. No pleuritic chestpain. Gastrointestinal: Negative for nausea, vomiting, diarrhea, constipation, abdominal pain, melena, blood in stool, hematemesis, jaundice and rectal bleeding.    Genitourinary: Negative for frequency, dysuria, incontinence,flank pain and hematuria; Musculoskeletal: Negative for back pain and neck pain. Negative for swelling and trauma.;  Skin: . Negative for pruritus, rash, abrasions,  bruising and skin lesion.; ulcerations Neuro: Negative for headache, lightheadedness and neck stiffness. Negative for weakness, altered level of consciousness , altered mental status, extremity weakness, burning feet, involuntary movement, seizure and syncope.  Psych: negative for anxiety, depression, insomnia, tearfulness, panic attacks, hallucinations, paranoia, suicidal or homicidal ideation    Past Medical History  Diagnosis Date  . Diabetes mellitus   . Hypertension   . Myocardial infarction 1985; 1990  . Angina   . Coronary artery disease   . Dysrhythmia   . DVT (deep venous thrombosis) ~ 12/2010    LLE  . Anemia   . Blood transfusion 06/1999    post heart transplant  . Peripheral vascular disease   . CHF (congestive heart failure) 2001    beffore transplant  . Renal failure     Hemodialysis MWF last 2 years, sse dr [name] nephrology, goes to [facility]  . Renal insufficiency     Past Surgical History  Procedure Laterality Date  . Nephrectomy transplanted organ  2008  . Av fistula repair      rt. a=fore arm fistula  . Av fistula placement  ~ 01/2010    "this was my 2nd fistula placement"  . Heart transplant  06/1999  . Coronary angioplasty with stent placement  1985; 1990  . Colonoscopy  03/17/2012    Procedure: COLONOSCOPY;  Surgeon: Lear Ng, MD;  Location: [facility];  Service: Endoscopy;  Laterality: N/A;  . Coronary artery bypass graft  1990    CABG X3  . Colonoscopy with propofol N/A 12/14/2013    Procedure: COLONOSCOPY WITH PROPOFOL;  Surgeon: Lear Ng, MD;  Location: [facility];  Service: Endoscopy;  Laterality: N/A;  . Video bronchoscopy Bilateral 04/14/2014  Procedure: VIDEO BRONCHOSCOPY WITH FLUORO;  Surgeon: Collene Gobble, MD;  Location: [facility];  Service: Cardiopulmonary;  Laterality: Bilateral;    Medications:  HOME MEDS: Prior to Admission medications   Medication Sig Start Date End Date Taking?  Authorizing Provider  aspirin EC 81 MG tablet Take 81 mg by mouth daily.     Yes Historical Provider, MD  atorvastatin (LIPITOR) 40 MG tablet Take 40 mg by mouth daily.     Yes Historical Provider, MD  azaTHIOprine (IMURAN) 50 MG tablet Take 75 mg by mouth every morning.    Yes Historical Provider, MD  b complex-vitamin c-folic acid (NEPHRO-VITE) 0.8 MG TABS tablet Take 1 tablet by mouth at bedtime.   Yes Historical Provider, MD  calcium acetate (PHOSLO) 667 MG capsule Take 667 mg by mouth 3 (three) times daily with meals.  11/01/12  Yes Historical Provider, MD  carvedilol (COREG) 6.25 MG tablet Take 1 tablet (6.25 mg total) by mouth at bedtime. 04/15/14  Yes Geradine Girt, DO  colesevelam (WELCHOL) 625 MG tablet Take 3,750 mg by mouth daily. Takes 6 tabs daily   Yes Historical Provider, MD  famotidine (PEPCID AC) 10 MG chewable tablet Chew 10 mg by mouth at bedtime.    Yes Historical Provider, MD  HUMALOG KWIKPEN 100 UNIT/ML SOPN Inject 6-10 Units into the skin 2 (two) times daily. Per sliding scale 11/17/12  Yes Historical Provider, MD  insulin glargine (LANTUS) 100 UNIT/ML injection Inject 20 Units into the skin every morning.   Yes Historical Provider, MD  isosorbide dinitrate (ISORDIL) 40 MG tablet Take 40 mg by mouth 3 (three) times daily.     Yes Historical Provider, MD  predniSONE (DELTASONE) 10 MG tablet Take 1 tablet (10 mg total) by mouth daily with breakfast. 04/15/14  Yes Geradine Girt, DO  sirolimus (RAPAMUNE) 2 MG tablet Take 2 mg by mouth daily.   Yes Historical Provider, MD     Allergies:  Allergies  Allergen Reactions  . Lisinopril Swelling    Lips and tongue swell  . Niacin And Related Other (See Comments)    unknown  . Norvasc [Amlodipine Besylate] Rash    Flushing  . Penicillins Rash    Social History:   reports that he quit smoking about 25 years ago. His smoking use included Cigarettes. He has a 20 pack-year smoking history. He has never used smokeless tobacco. He  reports that he drinks alcohol. He reports that he does not use illicit drugs.  Family History: Family History  Problem Relation Age of Onset  . Heart disease Mother   . Hyperlipidemia Mother   . Hypertension Mother   . Hyperlipidemia Father   . Hypertension Father   . Diabetes Brother   . Heart disease Brother     Heart Disease before age 41  . Hyperlipidemia Brother   . Hypertension Brother      Physical Exam: Filed Vitals:   05/05/14 2015 05/05/14 2043 05/05/14 2045 05/05/14 2130  BP: 147/63 139/62 139/62 131/50  Pulse: 95 92 92 92  Temp:      TempSrc:      Resp: 16 12 13 16   Height:      Weight:      SpO2: 97% 100% 98% 99%   Blood pressure 131/50, pulse 92, temperature 100.5 F (38.1 C), temperature source Oral, resp. rate 16, height 6' (1.829 m), weight 74.844 kg (165 lb), SpO2 99 %.  GEN:  Pleasant  patient lying in the stretcher  in no acute distress; cooperative with exam. PSYCH:  alert and oriented x4; does not appear anxious or depressed; affect is appropriate. HEENT: Mucous membranes pink and anicteric; PERRLA; EOM intact; no cervical lymphadenopathy nor thyromegaly or carotid bruit; no JVD; There were no stridor. Neck is very supple. Breasts:: Not examined CHEST WALL: No tenderness CHEST: Normal respiration, clear to auscultation bilaterally.  HEART: Regular rate and rhythm.  There are no murmur, rub, or gallops.   BACK: No kyphosis or scoliosis; no CVA tenderness ABDOMEN: soft and non-tender; no masses, no organomegaly, normal abdominal bowel sounds; no pannus; no intertriginous candida. There is no rebound and no distention. Rectal Exam: Not done EXTREMITIES: No bone or joint deformity; age-appropriate arthropathy of the hands and knees; no edema; no ulcerations.  There is no calf tenderness. Fistula on right arm. Genitalia: not examined PULSES: 2+ and symmetric SKIN: Normal hydration no rash or ulceration CNS: Cranial nerves 2-12 grossly intact no focal  lateralizing neurologic deficit.  Speech is fluent; uvula elevated with phonation, facial symmetry and tongue midline. DTR are normal bilaterally, cerebella exam is intact, barbinski is negative and strengths are equaled bilaterally.  No sensory loss.   Labs on Admission:  Basic Metabolic Panel:  Recent Labs Lab 05/05/14 1726  NA 137  K 2.9*  CL 93*  CO2 30  GLUCOSE 143*  BUN 17  CREATININE 4.92*  CALCIUM 8.6   Liver Function Tests:  Recent Labs Lab 05/05/14 1726  AST 28  ALT 9  ALKPHOS 56  BILITOT 0.4  PROT 5.9*  ALBUMIN 2.3*    Recent Labs Lab 05/05/14 1726  WBC 4.5  NEUTROABS 2.6  HGB 9.1*  HCT 29.8*  MCV 92.5  PLT 121*    Radiological Exams on Admission: Dg Chest 2 View  05/05/2014   CLINICAL DATA:  Fever of 102. Vomiting started today. Cough for 3 months. History of heart transplant in 2001. Diabetes, hypertension, CHF. Dialysis. CABG x3.  EXAM: CHEST  2 VIEW  COMPARISON:  04/14/2014  FINDINGS: Status post median sternotomy. Heart is enlarged. Stents are identified in the upper chest. There has been some improvement in aeration of the lung bases. Patchy densities persist however. No definite pleural effusions. No pulmonary edema.  IMPRESSION: Cardiomegaly. Persistent bilateral airspace filling opacities with some improvement in aeration since prior studies.   Electronically Signed   By: Shon Hale M.D.   On: 05/05/2014 21:00   Assessment/Plan Present on Admission:  . Fever of unknown origin (FUO) . DM (diabetes mellitus), type 2 with renal complications . Healthcare associated bacterial pneumonia . FUO (fever of unknown origin)  PLAN:  Will admit him for further investigation of FUO.  His prior work up was extensive and was negative.  It is possible that his fever is autoimmune rather than infectious (adult Still's disease) or atypical infections though CMV and fungal tests were negative.  Since he is stable and has no specific symptoms, will hold off on any  antibiotics tonight.  I will obtain CT of the chest with contrast and CT of the abd/pelvic with contrast (looking for inflammed bowels or micro-abscesses).  Will obtain legionella antigen, repeat blood and urine culture.  Check ferritin.  Please consult ID tomorrow.  He will also need dialysis tomorrow as well.  Thank you for allowing me to participate in his care.   Other plans as per orders.  Code Status: FULL Haskel Khan, MD. [facility] Pager [phone] 7pm to 7am.  05/05/2014, 10:07 PM

## 2014-05-05 NOTE — ED Notes (Signed)
Pt placed on 2L O2 

## 2014-05-05 NOTE — ED Notes (Signed)
MD at bedside. 

## 2014-05-05 NOTE — ED Notes (Signed)
Floor nurse inquiring about pts flu shot, states he has had his flu vaccine this year.

## 2014-05-05 NOTE — ED Provider Notes (Signed)
CSN: [id number]     Arrival date & time 05/05/14  1709 History   First MD Initiated Contact with Patient 05/05/14 1846     Chief Complaint  Patient presents with  . Fever     (Consider location/radiation/quality/duration/timing/severity/associated sxs/prior Treatment) HPI  68 year old male with a history of end-stage renal disease on hemodialysis, as well as heart transplant at [facility] in 2001 who presents complaining of fever. He has had several admissions recently for fever, and a recent treatment for pneumonia. He has had ongoing workup from pulmonology including bronchoscopy and biopsy, concern for fungal pneumonia, but all studies negative to date. He has also been seeing infectious disease as well.  He states that he has been feeling ill for some time, and been running low-grade fevers for several days, but no fever greater than 100.1 until today. This morning he spiked a fever to 102, but denies any coughing, does report some mild nausea, but no abdominal pain. He describes chills and muscle aches. He does not produce any urine. He has had no sputum production. He has been taking Tylenol which has reduced his fever and makes him feel a little bit better.  Past Medical History  Diagnosis Date  . Diabetes mellitus   . Hypertension   . Myocardial infarction 1985; 1990  . Angina   . Coronary artery disease   . Dysrhythmia   . DVT (deep venous thrombosis) ~ 12/2010    LLE  . Anemia   . Blood transfusion 06/1999    post heart transplant  . Peripheral vascular disease   . CHF (congestive heart failure) 2001    beffore transplant  . Renal failure     Hemodialysis MWF last 2 years, sse dr [name] nephrology, goes to [facility]  . Renal insufficiency    Past Surgical History  Procedure Laterality Date  . Nephrectomy transplanted organ  2008  . Av fistula repair      rt. a=fore arm fistula  . Av fistula placement  ~ 01/2010    "this was my 2nd fistula placement"   . Heart transplant  06/1999  . Coronary angioplasty with stent placement  1985; 1990  . Colonoscopy  03/17/2012    Procedure: COLONOSCOPY;  Surgeon: Lear Ng, MD;  Location: [facility];  Service: Endoscopy;  Laterality: N/A;  . Coronary artery bypass graft  1990    CABG X3  . Colonoscopy with propofol N/A 12/14/2013    Procedure: COLONOSCOPY WITH PROPOFOL;  Surgeon: Lear Ng, MD;  Location: [facility];  Service: Endoscopy;  Laterality: N/A;  . Video bronchoscopy Bilateral 04/14/2014    Procedure: VIDEO BRONCHOSCOPY WITH FLUORO;  Surgeon: Collene Gobble, MD;  Location: [facility];  Service: Cardiopulmonary;  Laterality: Bilateral;   Family History  Problem Relation Age of Onset  . Heart disease Mother   . Hyperlipidemia Mother   . Hypertension Mother   . Hyperlipidemia Father   . Hypertension Father   . Diabetes Brother   . Heart disease Brother     Heart Disease before age 20  . Hyperlipidemia Brother   . Hypertension Brother    History  Substance Use Topics  . Smoking status: Former Smoker -- 1.00 packs/day for 20 years    Types: Cigarettes    Quit date: 05/27/1988  . Smokeless tobacco: Never Used  . Alcohol Use: Yes     Comment: "Holidays"    Review of Systems  Respiratory: Positive for cough. Negative for shortness of  breath.   Gastrointestinal: Positive for nausea. Negative for vomiting, abdominal pain and diarrhea.  All other systems reviewed and are negative.     Allergies  Lisinopril; Niacin and related; Norvasc; and Penicillins  Home Medications   Prior to Admission medications   Medication Sig Start Date End Date Taking? Authorizing Provider  aspirin EC 81 MG tablet Take 81 mg by mouth daily.     Yes Historical Provider, MD  atorvastatin (LIPITOR) 40 MG tablet Take 40 mg by mouth daily.     Yes Historical Provider, MD  azaTHIOprine (IMURAN) 50 MG tablet Take 75 mg by mouth every morning.    Yes Historical Provider, MD  b  complex-vitamin c-folic acid (NEPHRO-VITE) 0.8 MG TABS tablet Take 1 tablet by mouth at bedtime.   Yes Historical Provider, MD  calcium acetate (PHOSLO) 667 MG capsule Take 667 mg by mouth 3 (three) times daily with meals.  11/01/12  Yes Historical Provider, MD  carvedilol (COREG) 6.25 MG tablet Take 1 tablet (6.25 mg total) by mouth at bedtime. 04/15/14  Yes Geradine Girt, DO  colesevelam (WELCHOL) 625 MG tablet Take 3,750 mg by mouth daily. Takes 6 tabs daily   Yes Historical Provider, MD  famotidine (PEPCID AC) 10 MG chewable tablet Chew 10 mg by mouth at bedtime.    Yes Historical Provider, MD  HUMALOG KWIKPEN 100 UNIT/ML SOPN Inject 6-10 Units into the skin 2 (two) times daily. Per sliding scale 11/17/12  Yes Historical Provider, MD  insulin glargine (LANTUS) 100 UNIT/ML injection Inject 20 Units into the skin every morning.   Yes Historical Provider, MD  isosorbide dinitrate (ISORDIL) 40 MG tablet Take 40 mg by mouth 3 (three) times daily.     Yes Historical Provider, MD  predniSONE (DELTASONE) 10 MG tablet Take 1 tablet (10 mg total) by mouth daily with breakfast. 04/15/14  Yes Geradine Girt, DO  sirolimus (RAPAMUNE) 2 MG tablet Take 2 mg by mouth daily.   Yes Historical Provider, MD   BP 148/63 mmHg  Pulse 93  Temp(Src) 100.5 F (38.1 C) (Oral)  Resp 15  Ht 6' (1.829 m)  Wt 165 lb (74.844 kg)  BMI 22.37 kg/m2  SpO2 97% Physical Exam  Constitutional: He is oriented to person, place, and time. He appears well-developed and well-nourished. No distress.  HENT:  Head: Normocephalic.  Eyes: Pupils are equal, round, and reactive to light.  Neck: Normal range of motion. Neck supple. No JVD present.  Cardiovascular: Normal rate and regular rhythm.  Exam reveals gallop.   Murmur heard. Pulmonary/Chest: Effort normal and breath sounds normal. No respiratory distress. He has no wheezes.  Abdominal: Soft. Bowel sounds are normal. He exhibits no distension. There is no tenderness.   Musculoskeletal:  AV fistula and right arm with no evidence of induration or erythema, palpable thrill  Neurological: He is alert and oriented to person, place, and time.  Skin: Skin is warm and dry.  No signs of breakdown or infection  Nursing note and vitals reviewed.   ED Course  Procedures (including critical care time) Labs Review Labs Reviewed  CBC WITH DIFFERENTIAL - Abnormal; Notable for the following:    RBC 3.22 (*)    Hemoglobin 9.1 (*)    HCT 29.8 (*)    RDW 16.5 (*)    Platelets 121 (*)    All other components within normal limits  COMPREHENSIVE METABOLIC PANEL - Abnormal; Notable for the following:    Potassium 2.9 (*)  Chloride 93 (*)    Glucose, Bld 143 (*)    Creatinine, Ser 4.92 (*)    Total Protein 5.9 (*)    Albumin 2.3 (*)    GFR calc non Af Amer 11 (*)    GFR calc Af Amer 13 (*)    All other components within normal limits  I-STAT CG4 LACTIC ACID, ED - Abnormal; Notable for the following:    Lactic Acid, Venous 2.24 (*)    All other components within normal limits    Imaging Review No results found.   EKG Interpretation None      MDM   Final diagnoses:  Cough   Patient with significant comorbidities including chronic immunosuppression status post heart transplant, as well as hemodialysis, presenting with fever. He is in no acute distress, alert and oriented with no difficulty breathing on his arrival. History and physical exam as above. Considering the patient has had multiple hospitalizations and has had pneumonia recently, high suspicion for recurrent pneumonia. Chest x-ray shows chronic bilateral interstitial edema, but improving opacities. He has a mild elevation of his lactic acid, but in the context of his renal failure and pulmonary edema, no IV fluids were given.  He is anemic, but is hemoglobin appears to be at baseline.  No identifiable source of infection on exam, but will send blood cultures, and admit for observation and  infectious disease consultation, he may benefit from broad-spectrum antibiotics should he begin to clinically worsen.  Leata Mouse, MD 05/06/14 [id number]  Blanchie Dessert, MD 05/09/14 [phone]

## 2014-05-05 NOTE — ED Notes (Signed)
Pt does dialysis MWF

## 2014-05-05 NOTE — ED Notes (Signed)
Gave pt a sprite. 

## 2014-05-05 NOTE — ED Notes (Signed)
Pt had heart transplant in 2001, on immunosuppressants.  Today pt states he felt flu like at home, checked temp at home, 102. Pt denies pain at this time, reports productive cough since September, took antibiotics for cough then was in hospital last month a few times with dx of possible pneumonia.

## 2014-05-05 NOTE — Progress Notes (Signed)
Called for report, RN states she will call to give report. Will follow up.

## 2014-05-05 NOTE — ED Notes (Signed)
Pt reports a fever at home and a fever of 102 at home. Pt reports that he has just not felt well. Pt reports some prior vomiting.

## 2014-05-06 ENCOUNTER — Inpatient Hospital Stay (HOSPITAL_COMMUNITY): Payer: Medicare Other

## 2014-05-06 DIAGNOSIS — T8612 Kidney transplant failure: Secondary | ICD-10-CM

## 2014-05-06 DIAGNOSIS — R609 Edema, unspecified: Secondary | ICD-10-CM

## 2014-05-06 DIAGNOSIS — M79609 Pain in unspecified limb: Secondary | ICD-10-CM

## 2014-05-06 DIAGNOSIS — Z940 Kidney transplant status: Secondary | ICD-10-CM

## 2014-05-06 DIAGNOSIS — E43 Unspecified severe protein-calorie malnutrition: Secondary | ICD-10-CM | POA: Insufficient documentation

## 2014-05-06 LAB — CBC
HEMATOCRIT: 28 % — AB (ref 39.0–52.0)
Hemoglobin: 8.8 g/dL — ABNORMAL LOW (ref 13.0–17.0)
MCH: 29.4 pg (ref 26.0–34.0)
MCHC: 31.4 g/dL (ref 30.0–36.0)
MCV: 93.6 fL (ref 78.0–100.0)
Platelets: 110 10*3/uL — ABNORMAL LOW (ref 150–400)
RBC: 2.99 MIL/uL — AB (ref 4.22–5.81)
RDW: 16.6 % — ABNORMAL HIGH (ref 11.5–15.5)
WBC: 4.8 10*3/uL (ref 4.0–10.5)

## 2014-05-06 LAB — RENAL FUNCTION PANEL
Albumin: 2 g/dL — ABNORMAL LOW (ref 3.5–5.2)
Anion gap: 15 (ref 5–15)
BUN: 24 mg/dL — ABNORMAL HIGH (ref 6–23)
CO2: 26 mEq/L (ref 19–32)
Calcium: 8.5 mg/dL (ref 8.4–10.5)
Chloride: 94 mEq/L — ABNORMAL LOW (ref 96–112)
Creatinine, Ser: 6.6 mg/dL — ABNORMAL HIGH (ref 0.50–1.35)
GFR calc Af Amer: 9 mL/min — ABNORMAL LOW (ref >90)
GFR calc non Af Amer: 8 mL/min — ABNORMAL LOW (ref >90)
Glucose, Bld: 169 mg/dL — ABNORMAL HIGH (ref 70–99)
Phosphorus: 2.3 mg/dL (ref 2.3–4.6)
Potassium: 3.9 mEq/L (ref 3.7–5.3)
Sodium: 135 mEq/L — ABNORMAL LOW (ref 137–147)

## 2014-05-06 LAB — APTT: aPTT: 48 seconds — ABNORMAL HIGH (ref 24–37)

## 2014-05-06 LAB — FERRITIN: Ferritin: 2490 ng/mL — ABNORMAL HIGH (ref 22–322)

## 2014-05-06 LAB — GLUCOSE, CAPILLARY
GLUCOSE-CAPILLARY: 154 mg/dL — AB (ref 70–99)
Glucose-Capillary: 169 mg/dL — ABNORMAL HIGH (ref 70–99)
Glucose-Capillary: 99 mg/dL (ref 70–99)

## 2014-05-06 LAB — PROTIME-INR
INR: 1.13 (ref 0.00–1.49)
Prothrombin Time: 14.7 seconds (ref 11.6–15.2)

## 2014-05-06 LAB — MRSA PCR SCREENING: MRSA BY PCR: NEGATIVE

## 2014-05-06 MED ORDER — INSULIN ASPART 100 UNIT/ML ~~LOC~~ SOLN
0.0000 [IU] | Freq: Three times a day (TID) | SUBCUTANEOUS | Status: DC
  Administered 2014-05-06: 2 [IU] via SUBCUTANEOUS
  Administered 2014-05-07: 1 [IU] via SUBCUTANEOUS
  Administered 2014-05-07 – 2014-05-10 (×4): 2 [IU] via SUBCUTANEOUS
  Administered 2014-05-10: 3 [IU] via SUBCUTANEOUS

## 2014-05-06 MED ORDER — DEXTROSE 5 % IV SOLN
2.0000 g | INTRAVENOUS | Status: DC
  Administered 2014-05-06 – 2014-05-09 (×2): 2 g via INTRAVENOUS
  Filled 2014-05-06 (×2): qty 2

## 2014-05-06 MED ORDER — IOHEXOL 300 MG/ML  SOLN
80.0000 mL | Freq: Once | INTRAMUSCULAR | Status: AC | PRN
  Administered 2014-05-06: 80 mL via INTRAVENOUS

## 2014-05-06 MED ORDER — DOXERCALCIFEROL 4 MCG/2ML IV SOLN
INTRAVENOUS | Status: AC
  Administered 2014-05-06: 1 ug via INTRAVENOUS
  Filled 2014-05-06: qty 2

## 2014-05-06 MED ORDER — HEPARIN (PORCINE) IN NACL 100-0.45 UNIT/ML-% IJ SOLN
1100.0000 [IU]/h | INTRAMUSCULAR | Status: DC
  Administered 2014-05-06 – 2014-05-07 (×2): 1100 [IU]/h via INTRAVENOUS
  Filled 2014-05-06 (×4): qty 250

## 2014-05-06 MED ORDER — DOXERCALCIFEROL 4 MCG/2ML IV SOLN
1.0000 ug | INTRAVENOUS | Status: DC
  Administered 2014-05-06 – 2014-05-11 (×3): 1 ug via INTRAVENOUS
  Filled 2014-05-06 (×2): qty 2

## 2014-05-06 MED ORDER — SODIUM CHLORIDE 0.9 % IV SOLN
125.0000 mg | Freq: Once | INTRAVENOUS | Status: AC
  Administered 2014-05-06: 125 mg via INTRAVENOUS
  Filled 2014-05-06 (×2): qty 10

## 2014-05-06 MED ORDER — DARBEPOETIN ALFA 60 MCG/0.3ML IJ SOSY: 60.0000 ug | PREFILLED_SYRINGE | INTRAMUSCULAR | Status: DC

## 2014-05-06 MED ORDER — VANCOMYCIN HCL 1000 MG IV SOLR
750.0000 mg | INTRAVENOUS | Status: DC
  Administered 2014-05-09: 750 mg via INTRAVENOUS
  Filled 2014-05-06: qty 750

## 2014-05-06 MED ORDER — VANCOMYCIN HCL 10 G IV SOLR
1250.0000 mg | Freq: Once | INTRAVENOUS | Status: AC
  Administered 2014-05-06: 1250 mg via INTRAVENOUS
  Filled 2014-05-06: qty 1250

## 2014-05-06 NOTE — Progress Notes (Signed)
New Admission Note:  Arrival Method: via stretcher with nurse Tech, transported with oxygen Mental Orientation: Alert and orientedx4 Telemetry: N/A Assessment: Completed Skin: clean, dry and intact Pain: Denies any pain Tubes:n/a Safety Measures: Safety Fall Prevention Plan was given, discussed and signed. Admission: initiated [facility] Orientation: Patient has been orientated to the room, unit and the staff. Family: None at bedside  Orders have been reviewed and implemented. Will continue to monitor the patient. Call light has been placed within reach and bed alarm has been activated.   Leandro Reasoner BSN, RN  Phone Number: [phone] [facility]

## 2014-05-06 NOTE — Progress Notes (Signed)
Patient returned from vascular lab - right upper extremity venous duplex noted to have an occlusive DVT in his right IJ vein. Dr. Candiss Norse notified. New orders received.  Joellen Jersey, RN.

## 2014-05-06 NOTE — Progress Notes (Signed)
INITIAL NUTRITION ASSESSMENT  Pt meets criteria for SEVERE MALNUTRITION in the context of chronic illness as evidenced by severe fat and muscle mass loss.  DOCUMENTATION CODES Per approved criteria  -Severe malnutrition in the context of chronic illness   INTERVENTION: Provide nourishment snack (turkey/ham sandwich). Ordered.  Encourage adequate PO intake.   NUTRITION DIAGNOSIS: Increased nutrient needs related to chronic illness as evidenced by estimated nutrition needs.   Goal: Pt to meet >/= 90% of their estimated nutrition needs   Monitor:  PO intake, weight trends, labs, I/O's  Reason for Assessment: MST  68 y.o. 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  Pt reports a great appetite currently and PTA at home eating 3 full meals a day. Meal completion is however 0-50%. Pt reports weight has been stable. Noted pt with severe fat and muscle mass loss. Pt declined all oral supplements, stating he has tried them all and does not like them. Pt was educated to try protein powder at home or dried milk powder to help aid in calorie and protein needs. RD to order nourishment snack (turkey/ham sandwich) as pt with inadequate PO intake. Pt was encouraged to eat his food at meals.   Nutrition Focused Physical Exam:  Subcutaneous Fat:  Orbital Region: N/A Upper Arm Region: WNL Thoracic and Lumbar Region: Severe depletion  Muscle:  Temple Region: N/A Clavicle Bone Region: WNL Clavicle and Acromion Bone Region: WNL Scapular Bone Region: N/A Dorsal Hand: WNL Patellar Region: Severe depletion Anterior Thigh Region: Severe depletion Posterior Calf  Region: Severe depletion  Edema: +1 UE edema  Labs: Low sodium, chloride, GFR. High BUN, creatinine.  Height: Ht Readings from Last 1 Encounters:  05/05/14 6' (1.829 m)    Weight: Wt Readings from Last 1 Encounters:  05/05/14 158 lb 14.4 oz (72.077 kg)    Ideal Body Weight: 178 lbs  % Ideal Body Weight: 89%  Wt Readings from Last 10 Encounters:  05/05/14 158 lb 14.4 oz (72.077 kg)  05/03/14 158 lb 8 oz (71.895 kg)  04/26/14 163 lb (73.936 kg)  04/15/14 157 lb 3 oz (71.3 kg)  04/09/14 160 lb 14.4 oz (72.984 kg)  03/30/14 153 lb 14.1 oz (69.8 kg)  12/14/13 165 lb (74.844 kg)  07/28/13 159 lb 2.8 oz (72.2 kg)  11/24/12 162 lb (73.483 kg)  03/17/12 165 lb (74.844 kg)    Usual Body Weight: 160 lbs  % Usual Body Weight: 99%  BMI:  Body mass index is 21.55 kg/(m^2).  Estimated Nutritional Needs: Kcal: 2000-2200 Protein: 95-105 grams Fluid: 1.2 L/day  Skin: +1 UE edema  Diet Order: Diet renal/carb modified with 1200 ml fluid restriction  EDUCATION NEEDS: -Education needs addressed.   Intake/Output Summary (Last 24 hours) at 05/06/14 0931 Last data filed at 05/06/14 0998  Gross per 24 hour  Intake    240 ml  Output      0 ml  Net    240 ml    Last BM: 12/10  Labs:   Recent Labs Lab 05/05/14 1726  NA 137  K 2.9*  CL 93*  CO2 30  BUN 17  CREATININE 4.92*  CALCIUM 8.6  GLUCOSE  143*    CBG (last 3)   Recent Labs  05/05/14 2327  GLUCAP 154*    Scheduled Meds: . aspirin EC  81 mg Oral Daily  . atorvastatin  40 mg Oral Daily  . azaTHIOprine  75 mg Oral q morning - 10a  . calcium acetate  667 mg Oral TID WC  . carvedilol  6.25 mg Oral QHS  . colesevelam  1,875 mg Oral BID WC  . famotidine  10 mg Oral QHS  . heparin  5,000 Units Subcutaneous 3 times per day  . insulin aspart  0-9 Units Subcutaneous TID WC  . isosorbide dinitrate  40 mg Oral TID WC  . multivitamin  1 tablet Oral QHS  . predniSONE  10 mg Oral Q breakfast  . sirolimus  2  mg Oral Daily    Continuous Infusions:   Past Medical History  Diagnosis Date  . Diabetes mellitus   . Hypertension   . Myocardial infarction 1985; 1990  . Angina   . Coronary artery disease   . Dysrhythmia   . DVT (deep venous thrombosis) ~ 12/2010    LLE  . Anemia   . Blood transfusion 06/1999    post heart transplant  . Peripheral vascular disease   . CHF (congestive heart failure) 2001    beffore transplant  . Renal failure     Hemodialysis MWF last 2 years, sse dr [name] nephrology, goes to [facility]  . Renal insufficiency     Past Surgical History  Procedure Laterality Date  . Nephrectomy transplanted organ  2008  . Av fistula repair      rt. a=fore arm fistula  . Av fistula placement  ~ 01/2010    "this was my 2nd fistula placement"  . Heart transplant  06/1999  . Coronary angioplasty with stent placement  1985; 1990  . Colonoscopy  03/17/2012    Procedure: COLONOSCOPY;  Surgeon: Lear Ng, MD;  Location: [facility];  Service: Endoscopy;  Laterality: N/A;  . Coronary artery bypass graft  1990    CABG X3  . Colonoscopy with propofol N/A 12/14/2013    Procedure: COLONOSCOPY WITH PROPOFOL;  Surgeon: Lear Ng, MD;  Location: [facility];  Service: Endoscopy;  Laterality: N/A;  . Video bronchoscopy Bilateral 04/14/2014    Procedure: VIDEO BRONCHOSCOPY WITH FLUORO;  Surgeon: Collene Gobble, MD;  Location: [place];  Service: Cardiopulmonary;  Laterality: Bilateral;    Kallie Locks, MS, RD, LDN Pager # [phone] After hours/ weekend pager # [phone]

## 2014-05-06 NOTE — Consult Note (Signed)
Indication for Consultation:  Management of ESRD/hemodialysis; anemia, hypertension/volume and secondary hyperparathyroidism  HPI: Brandon Sharp is a 68 y.o. male who presented to the ED yesterday with complaints of fever, temp 102 at home. He receives HD MWF @ [facility]. History of heart transplant, failed kidney transplant, CAD s/p CABG, HTN and DM. He remains on immunosuppression for heart transplant. He has had 2 recent admissions for fever last 11/16-20, had extensive workup and bronchoscopy 11/19 - negative TB, negative CMV, AFB and fungal smears were negative and AFB and fungal cultures are negative to date. He saw Dr. Megan Salon 12/8 and plan was to continue observing pt off antibiotics. He had been feeling well until yesterday when he had the fever. He was admitted for workup of recurrent fever.   Past Medical History  Diagnosis Date  . Diabetes mellitus   . Hypertension   . Myocardial infarction 1985; 1990  . Angina   . Coronary artery disease   . Dysrhythmia   . DVT (deep venous thrombosis) ~ 12/2010    LLE  . Anemia   . Blood transfusion 06/1999    post heart transplant  . Peripheral vascular disease   . CHF (congestive heart failure) 2001    beffore transplant  . Renal failure     Hemodialysis MWF last 2 years, sse dr [name] nephrology, goes to [place] [facility]  . Renal insufficiency    Past Surgical History  Procedure Laterality Date  . Nephrectomy transplanted organ  2008  . Av fistula repair      rt. a=fore arm fistula  . Av fistula placement  ~ 01/2010    "this was my 2nd fistula placement"  . Heart transplant  06/1999  . Coronary angioplasty with stent placement  1985; 1990  . Colonoscopy  03/17/2012    Procedure: COLONOSCOPY;  Surgeon: Lear Ng, MD;  Location: [facility];  Service: Endoscopy;  Laterality: N/A;  . Coronary artery bypass graft  1990    CABG X3  . Colonoscopy with propofol N/A 12/14/2013    Procedure: COLONOSCOPY WITH PROPOFOL;   Surgeon: Lear Ng, MD;  Location: [facility];  Service: Endoscopy;  Laterality: N/A;  . Video bronchoscopy Bilateral 04/14/2014    Procedure: VIDEO BRONCHOSCOPY WITH FLUORO;  Surgeon: Collene Gobble, MD;  Location: [place];  Service: Cardiopulmonary;  Laterality: Bilateral;   Family History  Problem Relation Age of Onset  . Heart disease Mother   . Hyperlipidemia Mother   . Hypertension Mother   . Hyperlipidemia Father   . Hypertension Father   . Diabetes Brother   . Heart disease Brother     Heart Disease before age 60  . Hyperlipidemia Brother   . Hypertension Brother    Social History:  reports that he quit smoking about 25 years ago. His smoking use included Cigarettes. He has a 20 pack-year smoking history. He has never used smokeless tobacco. He reports that he drinks alcohol. He reports that he does not use illicit drugs. Allergies  Allergen Reactions  . Lisinopril Swelling    Lips and tongue swell  . Niacin And Related Other (See Comments)    unknown  . Norvasc [Amlodipine Besylate] Rash    Flushing  . Penicillins Rash   Prior to Admission medications   Medication Sig Start Date End Date Taking? Authorizing Provider  aspirin EC 81 MG tablet Take 81 mg by mouth daily.     Yes Historical Provider, MD  atorvastatin (LIPITOR) 40 MG  tablet Take 40 mg by mouth daily.     Yes Historical Provider, MD  azaTHIOprine (IMURAN) 50 MG tablet Take 75 mg by mouth every morning.    Yes Historical Provider, MD  b complex-vitamin c-folic acid (NEPHRO-VITE) 0.8 MG TABS tablet Take 1 tablet by mouth at bedtime.   Yes Historical Provider, MD  calcium acetate (PHOSLO) 667 MG capsule Take 667 mg by mouth 3 (three) times daily with meals.  11/01/12  Yes Historical Provider, MD  carvedilol (COREG) 6.25 MG tablet Take 1 tablet (6.25 mg total) by mouth at bedtime. 04/15/14  Yes Geradine Girt, DO  colesevelam (WELCHOL) 625 MG tablet Take 3,750 mg by mouth daily. Takes 6 tabs daily    Yes Historical Provider, MD  famotidine (PEPCID AC) 10 MG chewable tablet Chew 10 mg by mouth at bedtime.    Yes Historical Provider, MD  HUMALOG KWIKPEN 100 UNIT/ML SOPN Inject 6-10 Units into the skin 2 (two) times daily. Per sliding scale 11/17/12  Yes Historical Provider, MD  insulin glargine (LANTUS) 100 UNIT/ML injection Inject 20 Units into the skin every morning.   Yes Historical Provider, MD  isosorbide dinitrate (ISORDIL) 40 MG tablet Take 40 mg by mouth 3 (three) times daily.     Yes Historical Provider, MD  predniSONE (DELTASONE) 10 MG tablet Take 1 tablet (10 mg total) by mouth daily with breakfast. 04/15/14  Yes Geradine Girt, DO  sirolimus (RAPAMUNE) 2 MG tablet Take 2 mg by mouth daily.   Yes Historical Provider, MD   Current Facility-Administered Medications  Medication Dose Route Frequency Provider Last Rate Last Dose  . acetaminophen (TYLENOL) tablet 650 mg  650 mg Oral Q6H PRN Orvan Falconer, MD   650 mg at 05/06/14 0500   Or  . acetaminophen (TYLENOL) suppository 650 mg  650 mg Rectal Q6H PRN Orvan Falconer, MD      . aspirin EC tablet 81 mg  81 mg Oral Daily Orvan Falconer, MD      . atorvastatin (LIPITOR) tablet 40 mg  40 mg Oral Daily Orvan Falconer, MD      . azaTHIOprine [facility]) tablet 75 mg  75 mg Oral q morning - 10a Orvan Falconer, MD      . calcium acetate (PHOSLO) capsule 667 mg  667 mg Oral TID WC Orvan Falconer, MD   667 mg at 05/06/14 0825  . carvedilol (COREG) tablet 6.25 mg  6.25 mg Oral QHS Orvan Falconer, MD   6.25 mg at 05/06/14 0004  . colesevelam [facility]) tablet 1,875 mg  1,875 mg Oral BID WC Orvan Falconer, MD   1,875 mg at 05/06/14 0825  . famotidine (PEPCID) tablet 10 mg  10 mg Oral QHS Orvan Falconer, MD   10 mg at 05/06/14 0004  . heparin injection 5,000 Units  5,000 Units Subcutaneous 3 times per day Orvan Falconer, MD      . insulin aspart (novoLOG) injection 0-9 Units  0-9 Units Subcutaneous TID WC Thurnell Lose, MD      . isosorbide dinitrate (ISORDIL) tablet 40 mg  40 mg Oral TID WC Orvan Falconer, MD    40 mg at 05/06/14 0827  . multivitamin (RENA-VIT) tablet 1 tablet  1 tablet Oral QHS Orvan Falconer, MD   1 tablet at 05/06/14 0003  . predniSONE (DELTASONE) tablet 10 mg  10 mg Oral Q breakfast Orvan Falconer, MD   10 mg at 05/06/14 0825  . sirolimus (RAPAMUNE) tablet 2 mg  2 mg Oral Daily  Orvan Falconer, MD       Labs: Basic Metabolic Panel:  Recent Labs Lab 05/05/14 1726  NA 137  K 2.9*  CL 93*  CO2 30  GLUCOSE 143*  BUN 17  CREATININE 4.92*  CALCIUM 8.6   Liver Function Tests:  Recent Labs Lab 05/05/14 1726  AST 28  ALT 9  ALKPHOS 56  BILITOT 0.4  PROT 5.9*  ALBUMIN 2.3*   No results for input(s): LIPASE, AMYLASE in the last 168 hours. No results for input(s): AMMONIA in the last 168 hours. CBC:  Recent Labs Lab 05/05/14 1726  WBC 4.5  NEUTROABS 2.6  HGB 9.1*  HCT 29.8*  MCV 92.5  PLT 121*   Cardiac Enzymes: No results for input(s): CKTOTAL, CKMB, CKMBINDEX, TROPONINI in the last 168 hours. CBG:  Recent Labs Lab 05/05/14 2327  GLUCAP 154*   Iron Studies:  Recent Labs  05/06/14  FERRITIN 2490*   Studies/Results: Dg Chest 2 View  05/05/2014   CLINICAL DATA:  Fever of 102. Vomiting started today. Cough for 3 months. History of heart transplant in 2001. Diabetes, hypertension, CHF. Dialysis. CABG x3.  EXAM: CHEST  2 VIEW  COMPARISON:  04/14/2014  FINDINGS: Status post median sternotomy. Heart is enlarged. Stents are identified in the upper chest. There has been some improvement in aeration of the lung bases. Patchy densities persist however. No definite pleural effusions. No pulmonary edema.  IMPRESSION: Cardiomegaly. Persistent bilateral airspace filling opacities with some improvement in aeration since prior studies.   Electronically Signed   By: Shon Hale M.D.   On: 05/05/2014 21:00   Ct Chest W Contrast  05/06/2014   CLINICAL DATA:  68 year old male with fever of unknown origin  EXAM: CT CHEST, ABDOMEN, AND PELVIS WITH CONTRAST  TECHNIQUE: Multidetector CT  imaging of the chest, abdomen and pelvis was performed following the standard protocol during bolus administration of intravenous contrast.  CONTRAST:  66mL OMNIPAQUE IOHEXOL 300 MG/ML  SOLN  COMPARISON:  04/13/2014  FINDINGS: CT CHEST FINDINGS  Heart/mediastinum: Median sternotomy wires are present. The heart is mildly enlarged. Scattered atherosclerotic aortic and coronary artery calcifications are present. There is no pericardial effusion. There is mild aneurysmal dilatation of the ascending aorta measured 3.9 cm. There is no aortic dissection. Several collateral vessels are noted within the soft tissues of the left lateral chest. An unchanged 7 mm left upper lobe pulmonary nodule is present (series 2, image 32).  There are no pathologically axillary lymph nodes.  Chest: Upper lobe predominant emphysematous changes are present. Similar appearing bibasilar and linear patchy areas of airspace consolidation is noted. Interstitial thickening is also present. Small bilateral pleural effusions are present. There is no pneumothorax upper lobe predominant centrilobular and paraseptal emphysematous changes are present. No suspicious pulmonary nodules or masses are identified. A few scattered calcified pulmonary nodules are noted.  Gynecomastia is noted.  CT ABDOMEN AND PELVIS FINDINGS  Liver: A 2.2 cm right and left hepatic lobe cysts are noted. Additional subcentimeter hypodense renal lesions are too small to fully characterize.  Gallbladder: There is no cholelithiasis or biliary sludge. There is no pericholecystic fluid. There is no abnormal gallbladder wall thickening. There is no intrahepatic or extrahepatic biliary ductal dilatation.  Spleen: Unremarkable.  Pancreas: Unremarkable.  Adrenal glands: Unremarkable.  Kidneys: Multiple hypodense lesions of varying sizes are seen noted throughout the kidneys, compatible with cysts. Renal cortical thinning is also present bilaterally. There is no hydronephrosis. Several tiny  nonobstructing calcific densities are noted bilaterally.  A right lower quadrant renal transplant is present.  Bowel/gastrointestinal tract: There is colonic diverticulosis without definite evidence for acute diverticulitis. There is no abnormal bowel wall thickening. There is no evidence of bowel obstruction  Pelvis: There is no pelvic mass. The urinary bladder is decompressed. The prostate gland is mildly enlarged and measures approximately 5.1 x 2.5 cm.  Miscellaneous: Extensive vascular calcifications are present. There is mild ectasia of the abdominal aorta.  Osseous structures: Multilevel degenerative changes of the spine are present. Degenerative changes of the hips are also noted. No acute osseous abnormality is identified.  IMPRESSION: 1. Findings remain concerning for multifocal pneumonia. 2. Colonic diverticulosis without definite evidence for acute diverticulitis. 3. Chronic medical renal disease. 4. Prostatic enlargement. 5. Aneurysmal dilatation of the ascending aorta. 6. Cardiomegaly. 7. 7 mm left upper lobe pulmonary nodule. If the patient is at high risk for bronchogenic carcinoma, follow-up chest CT at 3-50months is recommended. If the patient is at low risk for bronchogenic carcinoma, follow-up chest CT at 6-12 months is recommended. This recommendation follows the consensus statement: Guidelines for Management of Small Pulmonary Nodules Detected on CT Scans: A Statement from the [place] as published in Radiology 2005; 237:395-400. 8. Probable superimposed mild interstitial edema and trace bilateral pleural effusions.   Electronically Signed   By: Rosemarie Ax   On: 05/06/2014 08:39   Ct Abdomen Pelvis W Contrast  05/06/2014   CLINICAL DATA:  68 year old male with fever of unknown origin  EXAM: CT CHEST, ABDOMEN, AND PELVIS WITH CONTRAST  TECHNIQUE: Multidetector CT imaging of the chest, abdomen and pelvis was performed following the standard protocol during bolus administration of  intravenous contrast.  CONTRAST:  34mL OMNIPAQUE IOHEXOL 300 MG/ML  SOLN  COMPARISON:  04/13/2014  FINDINGS: CT CHEST FINDINGS  Heart/mediastinum: Median sternotomy wires are present. The heart is mildly enlarged. Scattered atherosclerotic aortic and coronary artery calcifications are present. There is no pericardial effusion. There is mild aneurysmal dilatation of the ascending aorta measured 3.9 cm. There is no aortic dissection. Several collateral vessels are noted within the soft tissues of the left lateral chest. An unchanged 7 mm left upper lobe pulmonary nodule is present (series 2, image 32).  There are no pathologically axillary lymph nodes.  Chest: Upper lobe predominant emphysematous changes are present. Similar appearing bibasilar and linear patchy areas of airspace consolidation is noted. Interstitial thickening is also present. Small bilateral pleural effusions are present. There is no pneumothorax upper lobe predominant centrilobular and paraseptal emphysematous changes are present. No suspicious pulmonary nodules or masses are identified. A few scattered calcified pulmonary nodules are noted.  Gynecomastia is noted.  CT ABDOMEN AND PELVIS FINDINGS  Liver: A 2.2 cm right and left hepatic lobe cysts are noted. Additional subcentimeter hypodense renal lesions are too small to fully characterize.  Gallbladder: There is no cholelithiasis or biliary sludge. There is no pericholecystic fluid. There is no abnormal gallbladder wall thickening. There is no intrahepatic or extrahepatic biliary ductal dilatation.  Spleen: Unremarkable.  Pancreas: Unremarkable.  Adrenal glands: Unremarkable.  Kidneys: Multiple hypodense lesions of varying sizes are seen noted throughout the kidneys, compatible with cysts. Renal cortical thinning is also present bilaterally. There is no hydronephrosis. Several tiny nonobstructing calcific densities are noted bilaterally. A right lower quadrant renal transplant is present.   Bowel/gastrointestinal tract: There is colonic diverticulosis without definite evidence for acute diverticulitis. There is no abnormal bowel wall thickening. There is no evidence of bowel obstruction  Pelvis: There is no pelvic  mass. The urinary bladder is decompressed. The prostate gland is mildly enlarged and measures approximately 5.1 x 2.5 cm.  Miscellaneous: Extensive vascular calcifications are present. There is mild ectasia of the abdominal aorta.  Osseous structures: Multilevel degenerative changes of the spine are present. Degenerative changes of the hips are also noted. No acute osseous abnormality is identified.  IMPRESSION: 1. Findings remain concerning for multifocal pneumonia. 2. Colonic diverticulosis without definite evidence for acute diverticulitis. 3. Chronic medical renal disease. 4. Prostatic enlargement. 5. Aneurysmal dilatation of the ascending aorta. 6. Cardiomegaly. 7. 7 mm left upper lobe pulmonary nodule. If the patient is at high risk for bronchogenic carcinoma, follow-up chest CT at 3-49months is recommended. If the patient is at low risk for bronchogenic carcinoma, follow-up chest CT at 6-12 months is recommended. This recommendation follows the consensus statement: Guidelines for Management of Small Pulmonary Nodules Detected on CT Scans: A Statement from the [place] as published in Radiology 2005; 237:395-400. 8. Probable superimposed mild interstitial edema and trace bilateral pleural effusions.   Electronically Signed   By: Rosemarie Ax   On: 05/06/2014 08:39    Review of Systems: Negative except for fever and mild fatigue and weakness.   Physical Exam: Filed Vitals:   05/05/14 2333 05/06/14 0454 05/06/14 0716 05/06/14 0921  BP: 130/51 132/45  115/51  Pulse: 90 106  89  Temp: 100.4 F (38 C) 103.1 F (39.5 C) 99.4 F (37.4 C) 99 F (37.2 C)  TempSrc: Oral Oral Oral Oral  Resp: 15 16  20   Height: 6' (1.829 m)     Weight: 72.077 kg (158 lb 14.4 oz)      SpO2: 100% 93%  80%     General: Well developed, well nourished, in no acute distress. Head: Normocephalic, atraumatic, sclera non-icteric, mucus membranes are moist Neck: Supple. JVD not elevated. No carotid bruit Lungs: Clear bilaterally to auscultation without wheezes, rales, or rhonchi. Breathing is unlabored. Heart: RRR with S1 S2. No murmurs, rubs, or gallops appreciated. Abdomen: Soft, non-tender, non-distended with normoactive bowel sounds. No rebound/guarding. No obvious abdominal masses. M-S:  Strength and tone appear normal for age. Lower extremities:without edema or ischemic changes, no open wounds  Neuro: Alert and oriented X 3. Moves all extremities spontaneously. Psych:  Responds to questions appropriately with a normal affect. Dialysis Access: R AVF +b/t. R arm with pitting edema. AF stable   Dialysis Orders:  MWF [facility] 71.5 kgs  2K/2.25ca  4hr 3000 Heparin  R AVF    400/1.5    hectorol 1  mcg IV/HD Aranesp 60 q wends  Venofer 100 x5- stop 12/11   Assessment/Plan: 1.  Recurrent fever- Tmax 103.1 extensive workup during previous admissions. Following with Dr. Campbell/Infection disease. Blood andur urine cultures pending. CT- concerning for PNA. LUL pulmonary nodule. LE dopplers pending- will get R arm doppler d/t swelling 2.  ESRD -  MWF @ [facility]. HD pending today K+2.9- was replaced 3.  Hypertension/volume  - 115/51. Getting to edw outpt. Cont coreg 4.  Anemia  - hgb 9.1 cont ESA on wends and last dose of Fe today. 5.  Metabolic bone disease -  Ca+ 8.6. Cont hectorol and phoslo. Last pth 10/28 215  Nutrition - alb 2.3 renal/carb diet  6. Heart transplant- on immunosuppressive therapy 7. DM per primary  Shelle Iron, NP [facility] [phone] 05/06/2014, 11:03 AM    Renal Agree with evaluation as articulated above. Romond Pipkins C

## 2014-05-06 NOTE — Procedures (Signed)
Tolerating hemodialysis without hemodynamic instability. Khadir Roam C

## 2014-05-06 NOTE — Plan of Care (Signed)
Problem: Phase I Progression Outcomes Goal: Pain controlled with appropriate interventions Outcome: Completed/Met Date Met:  05/06/14     

## 2014-05-06 NOTE — Progress Notes (Signed)
Patient going for CT of chest with contrast and CT of pelvis/abdomen with contrast. Patient started taking in contrast solution 05:20 am. Patient educated not to eat anything until procedure is complete. Will pass information on to morning RN. Will continue to monitor patient.

## 2014-05-06 NOTE — Progress Notes (Signed)
*  PRELIMINARY RESULTS* Vascular Ultrasound Right upper extremity venous duplex = Occlusive DVT involving the right internal jugular vein.   Bilateral lower extremity venous duplex = no evidence of DVT.    Landry Mellow, RDMS, RVT  05/06/2014, 5:24 PM

## 2014-05-06 NOTE — Consult Note (Signed)
[facility]  Date of Admission:  05/05/2014  Date of Consult:  05/06/2014  Reason for Consult: FUO Referring Physician: Candiss Norse  Impression/Recommendation FUO Cardiac Transplant Failed renal txp, ESRD  BCx BCx afb CMV blood EBV blood  fungal panel Lung BX? PTLD? fistulogram? Start vanco/cefepime  Comment- Very perplexing case. I am not sure as to the source of his fever and he underwent extensive w/u last month. Would expect if he had PTLD that his EBV would be positive. He has no localizing si/sx.   Thank you so much for this interesting consult,   Bobby Rumpf (pager) [phone] [email or web address]  Brandon Sharp is an 68 y.o. male.  HPI: 69 yo M with hx of cardiac transplant 2001 (prednisone 61m, imuran, sirolimus) who has been evaluated for FUO over the last 2 months. He was hospitalized in November with presumed pneumonia. His bronchoscopy, BAL and biopsy were all negative. He was treated with cefepime and vanco, d/c home on 11/20 on no anbx.  Quantiferon gold, HIV/influenza/CMV/hepatitis panel/ANA (-) 03-2014.  He returns 12-10 with return of his fever 102. He had CT chest showing multifocal pneumonia and a 769mLUL pulmonary nodule.  See ROS   Past Medical History  Diagnosis Date  . Diabetes mellitus   . Hypertension   . Myocardial infarction 1985; 1990  . Angina   . Coronary artery disease   . Dysrhythmia   . DVT (deep venous thrombosis) ~ 12/2010    LLE  . Anemia   . Blood transfusion 06/1999    post heart transplant  . Peripheral vascular disease   . CHF (congestive heart failure) 2001    beffore transplant  . Renal failure     Hemodialysis MWF last 2 years, [facility], [facility]  . Renal insufficiency     Past Surgical History  Procedure Laterality Date  . Nephrectomy transplanted organ  2008  . Av fistula repair      rt. a=fore arm fistula  . Av fistula  placement  ~ 01/2010    "this was my 2nd fistula placement"  . Heart transplant  06/1999  . Coronary angioplasty with stent placement  1985; 1990  . Colonoscopy  03/17/2012    Procedure: COLONOSCOPY;  Surgeon: ViLear NgMD;  Location: [facility];  Service: Endoscopy;  Laterality: N/A;  . Coronary artery bypass graft  1990    CABG X3  . Colonoscopy with propofol N/A 12/14/2013    Procedure: COLONOSCOPY WITH PROPOFOL;  Surgeon: ViLear NgMD;  Location: [facility];  Service: Endoscopy;  Laterality: N/A;  . Video bronchoscopy Bilateral 04/14/2014    Procedure: VIDEO BRONCHOSCOPY WITH FLUORO;  Surgeon: RoCollene GobbleMD;  [facility] Service: Card[facility]ulmonary;  Laterality: Bilateral;     Allergies  Allergen Reactions  . Lisinopril Swelling    Lips and tongue swell  . Niacin And Related Other (See Comments)    unknown  . Norvasc [Amlodipine Besylate] Rash    Flushing  . Penicillins Rash    Medications:  Scheduled: . aspirin EC  81 mg Oral Daily  . atorvastatin  40 mg Oral Daily  . azaTHIOprine  75 mg Oral q morning - 10a  . calcium acetate  667 mg Oral TID WC  . carvedilol  6.25 mg Oral QHS  . colesevelam  1,875 mg Oral BID WC  . [START ON 05/11/2014] darbepoetin (ARANESP) injection - DIALYSIS  60 mcg  Intravenous Q Wed-HD  . doxercalciferol  1 mcg Intravenous Q M,W,F-HD  . famotidine  10 mg Oral QHS  . heparin  5,000 Units Subcutaneous 3 times per day  . insulin aspart  0-9 Units Subcutaneous TID WC  . isosorbide dinitrate  40 mg Oral TID WC  . multivitamin  1 tablet Oral QHS  . predniSONE  10 mg Oral Q breakfast  . sirolimus  2 mg Oral Daily    Abtx:  Anti-infectives    None      Total days of antibiotics 0          Social History:  reports that he quit smoking about 25 years ago. His smoking use included Cigarettes. He has a 20 pack-year smoking history. He has never used smokeless tobacco. He reports that he drinks alcohol. He  reports that he does not use illicit drugs.  Family History  Problem Relation Age of Onset  . Heart disease Mother   . Hyperlipidemia Mother   . Hypertension Mother   . Hyperlipidemia Father   . Hypertension Father   . Diabetes Brother   . Heart disease Brother     Heart Disease before age 60  . Hyperlipidemia Brother   . Hypertension Brother     General ROS: no change in vision. no dysphagia. no problems with HD fistula. no diarrhea. no urine. no pain in calves. no new pets or animal exposures. see HPI.   Blood pressure 141/66, pulse 80, temperature 100.4 F (38 C), temperature source Oral, resp. rate 18, height 6' (1.829 m), weight 72.7 kg (160 lb 4.4 oz), SpO2 92 %. General appearance: alert, cooperative, mild distress and having chills on HD Eyes: negative findings: pupils equal, round, reactive to light and accomodation Throat: normal findings: oropharynx pink & moist without lesions or evidence of thrush Neck: no adenopathy and supple, symmetrical, trachea midline Lungs: clear to auscultation bilaterally Heart: regular rate and rhythm Abdomen: normal findings: bowel sounds normal and soft, non-tender Extremities: edema none, no LE cordis or tenderness.  and RUE AVF is +bruit.    Results for orders placed or performed during the hospital encounter of 05/05/14 (from the past 48 hour(s))  CBC with Differential     Status: Abnormal   Collection Time: 05/05/14  5:26 PM  Result Value Ref Range   WBC 4.5 4.0 - 10.5 K/uL   RBC 3.22 (L) 4.22 - 5.81 MIL/uL   Hemoglobin 9.1 (L) 13.0 - 17.0 g/dL   HCT 29.8 (L) 39.0 - 52.0 %   MCV 92.5 78.0 - 100.0 fL   MCH 28.3 26.0 - 34.0 pg   MCHC 30.5 30.0 - 36.0 g/dL   RDW 16.5 (H) 11.5 - 15.5 %   Platelets 121 (L) 150 - 400 K/uL   Neutrophils Relative % 58 43 - 77 %   Neutro Abs 2.6 1.7 - 7.7 K/uL   Lymphocytes Relative 32 12 - 46 %   Lymphs Abs 1.4 0.7 - 4.0 K/uL   Monocytes Relative 8 3 - 12 %   Monocytes Absolute 0.3 0.1 - 1.0 K/uL     Eosinophils Relative 2 0 - 5 %   Eosinophils Absolute 0.1 0.0 - 0.7 K/uL   Basophils Relative 0 0 - 1 %   Basophils Absolute 0.0 0.0 - 0.1 K/uL  Comprehensive metabolic panel     Status: Abnormal   Collection Time: 05/05/14  5:26 PM  Result Value Ref Range   Sodium 137 137 - 147 mEq/L  Potassium 2.9 (LL) 3.7 - 5.3 mEq/L    Comment: CRITICAL RESULT CALLED TO, READ BACK BY AND VERIFIED WITH: B CHANDLER,RN 1814 05/05/14 D BRADLEY    Chloride 93 (L) 96 - 112 mEq/L   CO2 30 19 - 32 mEq/L   Glucose, Bld 143 (H) 70 - 99 mg/dL   BUN 17 6 - 23 mg/dL   Creatinine, Ser 4.92 (H) 0.50 - 1.35 mg/dL   Calcium 8.6 8.4 - 10.5 mg/dL   Total Protein 5.9 (L) 6.0 - 8.3 g/dL   Albumin 2.3 (L) 3.5 - 5.2 g/dL   AST 28 0 - 37 U/L   ALT 9 0 - 53 U/L   Alkaline Phosphatase 56 39 - 117 U/L   Total Bilirubin 0.4 0.3 - 1.2 mg/dL   GFR calc non Af Amer 11 (L) >90 mL/min   GFR calc Af Amer 13 (L) >90 mL/min    Comment: (NOTE) The eGFR has been calculated using the CKD EPI equation. This calculation has not been validated in all clinical situations. eGFR's persistently <90 mL/min signify possible Chronic Kidney Disease.    Anion gap 14 5 - 15  I-Stat CG4 Lactic Acid, ED     Status: Abnormal   Collection Time: 05/05/14  5:44 PM  Result Value Ref Range   Lactic Acid, Venous 2.24 (H) 0.5 - 2.2 mmol/L  Glucose, capillary     Status: Abnormal   Collection Time: 05/05/14 11:27 PM  Result Value Ref Range   Glucose-Capillary 154 (H) 70 - 99 mg/dL  Ferritin     Status: Abnormal   Collection Time: 05/06/14 12:00 AM  Result Value Ref Range   Ferritin 2490 (H) 22 - 322 ng/mL    Comment: Result confirmed by automatic dilution. Performed at [facility] PCR Screening     Status: None   Collection Time: 05/06/14 12:19 AM  Result Value Ref Range   MRSA by PCR NEGATIVE NEGATIVE    Comment:        The GeneXpert MRSA Assay (FDA approved for NASAL specimens only), is one component of  a comprehensive MRSA colonization surveillance program. It is not intended to diagnose MRSA infection nor to guide or monitor treatment for MRSA infections.   Glucose, capillary     Status: Abnormal   Collection Time: 05/06/14 11:31 AM  Result Value Ref Range   Glucose-Capillary 169 (H) 70 - 99 mg/dL  Renal function panel     Status: Abnormal   Collection Time: 05/06/14 12:05 PM  Result Value Ref Range   Sodium 135 (L) 137 - 147 mEq/L   Potassium 3.9 3.7 - 5.3 mEq/L   Chloride 94 (L) 96 - 112 mEq/L   CO2 26 19 - 32 mEq/L   Glucose, Bld 169 (H) 70 - 99 mg/dL   BUN 24 (H) 6 - 23 mg/dL   Creatinine, Ser 6.60 (H) 0.50 - 1.35 mg/dL   Calcium 8.5 8.4 - 10.5 mg/dL   Phosphorus 2.3 2.3 - 4.6 mg/dL   Albumin 2.0 (L) 3.5 - 5.2 g/dL   GFR calc non Af Amer 8 (L) >90 mL/min   GFR calc Af Amer 9 (L) >90 mL/min    Comment: (NOTE) The eGFR has been calculated using the CKD EPI equation. This calculation has not been validated in all clinical situations. eGFR's persistently <90 mL/min signify possible Chronic Kidney Disease.    Anion gap 15 5 - 15      Component Value Date/Time  SDES BRONCHIAL ALVEOLAR LAVAGE 04/14/2014 1201   SDES BRONCHIAL ALVEOLAR LAVAGE 04/14/2014 1201   SDES BRONCHIAL ALVEOLAR LAVAGE 04/14/2014 1201   SPECREQUEST Immunocompromised 04/14/2014 1201   SPECREQUEST Immunocompromised 04/14/2014 1201   SPECREQUEST Immunocompromised 04/14/2014 1201   CULT  04/14/2014 1201    CULTURE WILL BE EXAMINED FOR 6 WEEKS BEFORE ISSUING A FINAL REPORT Performed at [place]  04/14/2014 1201    Non-Pathogenic Oropharyngeal-type [place]ora Isolated. Performed at [place]  04/14/2014 [place] Performed at [facility] PENDING 04/14/2014 1201   REPTSTATUS 04/16/2014 FINAL 04/14/2014 1201   REPTSTATUS PENDING 04/14/2014 1201   Dg Chest 2 View  05/05/2014   CLINICAL DATA:  Fever of 102.  Vomiting started today. Cough for 3 months. History of heart transplant in 2001. Diabetes, hypertension, CHF. Dialysis. CABG x3.  EXAM: CHEST  2 VIEW  COMPARISON:  04/14/2014  FINDINGS: Status post median sternotomy. Heart is enlarged. Stents are identified in the upper chest. There has been some improvement in aeration of the lung bases. Patchy densities persist however. No definite pleural effusions. No pulmonary edema.  IMPRESSION: Cardiomegaly. Persistent bilateral airspace filling opacities with some improvement in aeration since prior studies.   Electronically Signed   By: Shon Hale M.D.   On: 05/05/2014 21:00   Ct Chest W Contrast  05/06/2014   CLINICAL DATA:  68 year old male with fever of unknown origin  EXAM: CT CHEST, ABDOMEN, AND PELVIS WITH CONTRAST  TECHNIQUE: Multidetector CT imaging of the chest, abdomen and pelvis was performed following the standard protocol during bolus administration of intravenous contrast.  CONTRAST:  33m OMNIPAQUE IOHEXOL 300 MG/ML  SOLN  COMPARISON:  04/13/2014  FINDINGS: CT CHEST FINDINGS  Heart/mediastinum: Median sternotomy wires are present. The heart is mildly enlarged. Scattered atherosclerotic aortic and coronary artery calcifications are present. There is no pericardial effusion. There is mild aneurysmal dilatation of the ascending aorta measured 3.9 cm. There is no aortic dissection. Several collateral vessels are noted within the soft tissues of the left lateral chest. An unchanged 7 mm left upper lobe pulmonary nodule is present (series 2, image 32).  There are no pathologically axillary lymph nodes.  Chest: Upper lobe predominant emphysematous changes are present. Similar appearing bibasilar and linear patchy areas of airspace consolidation is noted. Interstitial thickening is also present. Small bilateral pleural effusions are present. There is no pneumothorax upper lobe predominant centrilobular and paraseptal emphysematous changes are present. No  suspicious pulmonary nodules or masses are identified. A few scattered calcified pulmonary nodules are noted.  Gynecomastia is noted.  CT ABDOMEN AND PELVIS FINDINGS  Liver: A 2.2 cm right and left hepatic lobe cysts are noted. Additional subcentimeter hypodense renal lesions are too small to fully characterize.  Gallbladder: There is no cholelithiasis or biliary sludge. There is no pericholecystic fluid. There is no abnormal gallbladder wall thickening. There is no intrahepatic or extrahepatic biliary ductal dilatation.  Spleen: Unremarkable.  Pancreas: Unremarkable.  Adrenal glands: Unremarkable.  Kidneys: Multiple hypodense lesions of varying sizes are seen noted throughout the kidneys, compatible with cysts. Renal cortical thinning is also present bilaterally. There is no hydronephrosis. Several tiny nonobstructing calcific densities are noted bilaterally. A right lower quadrant renal transplant is present.  Bowel/gastrointestinal tract: There is colonic diverticulosis without definite evidence for acute diverticulitis. There is no abnormal bowel wall thickening. There is no evidence of bowel  obstruction  Pelvis: There is no pelvic mass. The urinary bladder is decompressed. The prostate gland is mildly enlarged and measures approximately 5.1 x 2.5 cm.  Miscellaneous: Extensive vascular calcifications are present. There is mild ectasia of the abdominal aorta.  Osseous structures: Multilevel degenerative changes of the spine are present. Degenerative changes of the hips are also noted. No acute osseous abnormality is identified.  IMPRESSION: 1. Findings remain concerning for multifocal pneumonia. 2. Colonic diverticulosis without definite evidence for acute diverticulitis. 3. Chronic medical renal disease. 4. Prostatic enlargement. 5. Aneurysmal dilatation of the ascending aorta. 6. Cardiomegaly. 7. 7 mm left upper lobe pulmonary nodule. If the patient is at high risk for bronchogenic carcinoma, follow-up chest CT  at 3-72month is recommended. If the patient is at low risk for bronchogenic carcinoma, follow-up chest CT at 6-12 months is recommended. This recommendation follows the consensus statement: Guidelines for Management of Small Pulmonary Nodules Detected on CT Scans: A Statement from the [employer] published in Radiology 2005; 237:395-400. 8. Probable superimposed mild interstitial edema and trace bilateral pleural effusions.   Electronically Signed   By: KRosemarie Ax  On: 05/06/2014 08:39   Ct Abdomen Pelvis W Contrast  05/06/2014   CLINICAL DATA:  68year old male with fever of unknown origin  EXAM: CT CHEST, ABDOMEN, AND PELVIS WITH CONTRAST  TECHNIQUE: Multidetector CT imaging of the chest, abdomen and pelvis was performed following the standard protocol during bolus administration of intravenous contrast.  CONTRAST:  832mOMNIPAQUE IOHEXOL 300 MG/ML  SOLN  COMPARISON:  04/13/2014  FINDINGS: CT CHEST FINDINGS  Heart/mediastinum: Median sternotomy wires are present. The heart is mildly enlarged. Scattered atherosclerotic aortic and coronary artery calcifications are present. There is no pericardial effusion. There is mild aneurysmal dilatation of the ascending aorta measured 3.9 cm. There is no aortic dissection. Several collateral vessels are noted within the soft tissues of the left lateral chest. An unchanged 7 mm left upper lobe pulmonary nodule is present (series 2, image 32).  There are no pathologically axillary lymph nodes.  Chest: Upper lobe predominant emphysematous changes are present. Similar appearing bibasilar and linear patchy areas of airspace consolidation is noted. Interstitial thickening is also present. Small bilateral pleural effusions are present. There is no pneumothorax upper lobe predominant centrilobular and paraseptal emphysematous changes are present. No suspicious pulmonary nodules or masses are identified. A few scattered calcified pulmonary nodules are noted.   Gynecomastia is noted.  CT ABDOMEN AND PELVIS FINDINGS  Liver: A 2.2 cm right and left hepatic lobe cysts are noted. Additional subcentimeter hypodense renal lesions are too small to fully characterize.  Gallbladder: There is no cholelithiasis or biliary sludge. There is no pericholecystic fluid. There is no abnormal gallbladder wall thickening. There is no intrahepatic or extrahepatic biliary ductal dilatation.  Spleen: Unremarkable.  Pancreas: Unremarkable.  Adrenal glands: Unremarkable.  Kidneys: Multiple hypodense lesions of varying sizes are seen noted throughout the kidneys, compatible with cysts. Renal cortical thinning is also present bilaterally. There is no hydronephrosis. Several tiny nonobstructing calcific densities are noted bilaterally. A right lower quadrant renal transplant is present.  Bowel/gastrointestinal tract: There is colonic diverticulosis without definite evidence for acute diverticulitis. There is no abnormal bowel wall thickening. There is no evidence of bowel obstruction  Pelvis: There is no pelvic mass. The urinary bladder is decompressed. The prostate gland is mildly enlarged and measures approximately 5.1 x 2.5 cm.  Miscellaneous: Extensive vascular calcifications are present. There is mild ectasia of the abdominal aorta.  Osseous structures: Multilevel degenerative changes of the spine are present. Degenerative changes of the hips are also noted. No acute osseous abnormality is identified.  IMPRESSION: 1. Findings remain concerning for multifocal pneumonia. 2. Colonic diverticulosis without definite evidence for acute diverticulitis. 3. Chronic medical renal disease. 4. Prostatic enlargement. 5. Aneurysmal dilatation of the ascending aorta. 6. Cardiomegaly. 7. 7 mm left upper lobe pulmonary nodule. If the patient is at high risk for bronchogenic carcinoma, follow-up chest CT at 3-25month is recommended. If the patient is at low risk for bronchogenic carcinoma, follow-up chest CT at  6-12 months is recommended. This recommendation follows the consensus statement: Guidelines for Management of Small Pulmonary Nodules Detected on CT Scans: A Statement from the [employer] published in Radiology 2005; 237:395-400. 8. Probable superimposed mild interstitial edema and trace bilateral pleural effusions.   Electronically Signed   By: KRosemarie Ax  On: 05/06/2014 08:39   Recent Results (from the past 240 hour(s))  MRSA PCR Screening     Status: None   Collection Time: 05/06/14 12:19 AM  Result Value Ref Range Status   MRSA by PCR NEGATIVE NEGATIVE Final    Comment:        The GeneXpert MRSA Assay (FDA approved for NASAL specimens only), is one component of a comprehensive MRSA colonization surveillance program. It is not intended to diagnose MRSA infection nor to guide or monitor treatment for MRSA infections.       05/06/2014, 3:15 PM     LOS: 1 day

## 2014-05-06 NOTE — Progress Notes (Signed)
Patient Demographics  Brandon Sharp, is a 68 y.o. male, DOB - 03-31-1946, TDD:[id number]  Admit date - 05/05/2014   Admitting Physician Orvan Falconer, MD  Outpatient Primary MD for the patient is [name], MD  LOS - 1   Chief Complaint  Patient presents with  . Fever        Subjective:   Brandon Sharp today has, No headache, No chest pain, No abdominal pain - No Nausea, No new weakness tingling or numbness, No Cough - SOB.    Assessment & Plan    1. Fever of unknown origin (FUO) - ongoing for 4-6 weeks, so far extensive workup including blood cultures, urine cultures, bronchial with negative AFB and fungal smears so far, question multifocal pneumonia on CT scan, have requested ID to evaluate and comment. Will defer to ID for the management of this problem, will check venous duplex to rule out DVT as a cause of fevers.   2. Heart transplant, failed kidney transplant. Continue home immunosuppressants. Follow with transplant clinic post discharge.   3. ESRD. On Monday Wednesday Friday schedule. Renal consulted.   4. DM type II. On sliding scale   Lab Results  Component Value Date   HGBA1C 7.2* 04/12/2014    CBG (last 3)   Recent Labs  05/05/14 2327  GLUCAP 154*     5. Dyslipidemia. Continue home dose statin.    6. CAD. No acute issues. On Imdur continue.      Code Status: Full  Family Communication: None present  Disposition Plan: Home   Procedures CT scan chest abdomen pelvis, lower extremity venous duplex   Consults  ID, Renal   Medications  Scheduled Meds: . aspirin EC  81 mg Oral Daily  . atorvastatin  40 mg Oral Daily  . azaTHIOprine  75 mg Oral q morning - 10a  . calcium acetate  667 mg Oral TID WC  . carvedilol  6.25 mg Oral QHS  .  colesevelam  1,875 mg Oral BID WC  . famotidine  10 mg Oral QHS  . heparin  5,000 Units Subcutaneous 3 times per day  . insulin aspart  0-9 Units Subcutaneous TID WC  . isosorbide dinitrate  40 mg Oral TID WC  . multivitamin  1 tablet Oral QHS  . predniSONE  10 mg Oral Q breakfast  . sirolimus  2 mg Oral Daily   Continuous Infusions:  PRN Meds:.acetaminophen **OR** acetaminophen  DVT Prophylaxis    Heparin    Lab Results  Component Value Date   PLT 121* 05/05/2014    Antibiotics     Anti-infectives    None          Objective:   Filed Vitals:   05/05/14 2333 05/06/14 0454 05/06/14 0716 05/06/14 0921  BP: 130/51 132/45  115/51  Pulse: 90 106  89  Temp: 100.4 F (38 C) 103.1 F (39.5 C) 99.4 F (37.4 C) 99 F (37.2 C)  TempSrc: Oral Oral Oral Oral  Resp: 15 16  20   Height: 6' (1.829 m)     Weight: 72.077 kg (158 lb 14.4 oz)     SpO2: 100% 93%  80%    Wt Readings from Last 3 Encounters:  05/05/14 72.077 kg (158  lb 14.4 oz)  05/03/14 71.895 kg (158 lb 8 oz)  04/26/14 73.936 kg (163 lb)     Intake/Output Summary (Last 24 hours) at 05/06/14 1046 Last data filed at 05/06/14 5188  Gross per 24 hour  Intake    240 ml  Output      0 ml  Net    240 ml     Physical Exam  Awake Alert, Oriented X 3, No new F.N deficits, Normal affect [place].AT,PERRAL Supple Neck,No JVD, No cervical lymphadenopathy appriciated.  Symmetrical Chest wall movement, Good air movement bilaterally, CTAB RRR,No Gallops,Rubs , positive systolic Murmurs, No Parasternal Heave +ve B.Sounds, Abd Soft, No tenderness, No organomegaly appriciated, No rebound - guarding or rigidity. No Cyanosis, Clubbing or edema, No new Rash or bruise      Data Review   Micro Results Recent Results (from the past 240 hour(s))  MRSA PCR Screening     Status: None   Collection Time: 05/06/14 12:19 AM  Result Value Ref Range Status   MRSA by PCR NEGATIVE NEGATIVE Final    Comment:        The GeneXpert MRSA  Assay (FDA approved for NASAL specimens only), is one component of a comprehensive MRSA colonization surveillance program. It is not intended to diagnose MRSA infection nor to guide or monitor treatment for MRSA infections.     Radiology Reports Dg Chest 2 View  05/05/2014   CLINICAL DATA:  Fever of 102. Vomiting started today. Cough for 3 months. History of heart transplant in 2001. Diabetes, hypertension, CHF. Dialysis. CABG x3.  EXAM: CHEST  2 VIEW  COMPARISON:  04/14/2014  FINDINGS: Status post median sternotomy. Heart is enlarged. Stents are identified in the upper chest. There has been some improvement in aeration of the lung bases. Patchy densities persist however. No definite pleural effusions. No pulmonary edema.  IMPRESSION: Cardiomegaly. Persistent bilateral airspace filling opacities with some improvement in aeration since prior studies.   Electronically Signed   By: Shon Hale M.D.   On: 05/05/2014 21:00     Ct Chest W Contrast  05/06/2014   CLINICAL DATA:  68 year old male with fever of unknown origin  EXAM: CT CHEST, ABDOMEN, AND PELVIS WITH CONTRAST  TECHNIQUE: Multidetector CT imaging of the chest, abdomen and pelvis was performed following the standard protocol during bolus administration of intravenous contrast.  CONTRAST:  42mL OMNIPAQUE IOHEXOL 300 MG/ML  SOLN  COMPARISON:  04/13/2014  FINDINGS: CT CHEST FINDINGS  Heart/mediastinum: Median sternotomy wires are present. The heart is mildly enlarged. Scattered atherosclerotic aortic and coronary artery calcifications are present. There is no pericardial effusion. There is mild aneurysmal dilatation of the ascending aorta measured 3.9 cm. There is no aortic dissection. Several collateral vessels are noted within the soft tissues of the left lateral chest. An unchanged 7 mm left upper lobe pulmonary nodule is present (series 2, image 32).  There are no pathologically axillary lymph nodes.  Chest: Upper lobe predominant  emphysematous changes are present. Similar appearing bibasilar and linear patchy areas of airspace consolidation is noted. Interstitial thickening is also present. Small bilateral pleural effusions are present. There is no pneumothorax upper lobe predominant centrilobular and paraseptal emphysematous changes are present. No suspicious pulmonary nodules or masses are identified. A few scattered calcified pulmonary nodules are noted.  Gynecomastia is noted.  CT ABDOMEN AND PELVIS FINDINGS  Liver: A 2.2 cm right and left hepatic lobe cysts are noted. Additional subcentimeter hypodense renal lesions are too small to fully characterize.  Gallbladder: There is no cholelithiasis or biliary sludge. There is no pericholecystic fluid. There is no abnormal gallbladder wall thickening. There is no intrahepatic or extrahepatic biliary ductal dilatation.  Spleen: Unremarkable.  Pancreas: Unremarkable.  Adrenal glands: Unremarkable.  Kidneys: Multiple hypodense lesions of varying sizes are seen noted throughout the kidneys, compatible with cysts. Renal cortical thinning is also present bilaterally. There is no hydronephrosis. Several tiny nonobstructing calcific densities are noted bilaterally. A right lower quadrant renal transplant is present.  Bowel/gastrointestinal tract: There is colonic diverticulosis without definite evidence for acute diverticulitis. There is no abnormal bowel wall thickening. There is no evidence of bowel obstruction  Pelvis: There is no pelvic mass. The urinary bladder is decompressed. The prostate gland is mildly enlarged and measures approximately 5.1 x 2.5 cm.  Miscellaneous: Extensive vascular calcifications are present. There is mild ectasia of the abdominal aorta.  Osseous structures: Multilevel degenerative changes of the spine are present. Degenerative changes of the hips are also noted. No acute osseous abnormality is identified.  IMPRESSION: 1. Findings remain concerning for multifocal  pneumonia. 2. Colonic diverticulosis without definite evidence for acute diverticulitis. 3. Chronic medical renal disease. 4. Prostatic enlargement. 5. Aneurysmal dilatation of the ascending aorta. 6. Cardiomegaly. 7. 7 mm left upper lobe pulmonary nodule. If the patient is at high risk for bronchogenic carcinoma, follow-up chest CT at 3-54months is recommended. If the patient is at low risk for bronchogenic carcinoma, follow-up chest CT at 6-12 months is recommended. This recommendation follows the consensus statement: Guidelines for Management of Small Pulmonary Nodules Detected on CT Scans: A Statement from the [employer] as published in Radiology 2005; 237:395-400. 8. Probable superimposed mild interstitial edema and trace bilateral pleural effusions.   Electronically Signed   By: Rosemarie Ax   On: 05/06/2014 08:39         Ct Abdomen Pelvis W Contrast  05/06/2014   CLINICAL DATA:  68 year old male with fever of unknown origin  EXAM: CT CHEST, ABDOMEN, AND PELVIS WITH CONTRAST  TECHNIQUE: Multidetector CT imaging of the chest, abdomen and pelvis was performed following the standard protocol during bolus administration of intravenous contrast.  CONTRAST:  32mL OMNIPAQUE IOHEXOL 300 MG/ML  SOLN  COMPARISON:  04/13/2014  FINDINGS: CT CHEST FINDINGS  Heart/mediastinum: Median sternotomy wires are present. The heart is mildly enlarged. Scattered atherosclerotic aortic and coronary artery calcifications are present. There is no pericardial effusion. There is mild aneurysmal dilatation of the ascending aorta measured 3.9 cm. There is no aortic dissection. Several collateral vessels are noted within the soft tissues of the left lateral chest. An unchanged 7 mm left upper lobe pulmonary nodule is present (series 2, image 32).  There are no pathologically axillary lymph nodes.  Chest: Upper lobe predominant emphysematous changes are present. Similar appearing bibasilar and linear patchy areas of airspace  consolidation is noted. Interstitial thickening is also present. Small bilateral pleural effusions are present. There is no pneumothorax upper lobe predominant centrilobular and paraseptal emphysematous changes are present. No suspicious pulmonary nodules or masses are identified. A few scattered calcified pulmonary nodules are noted.  Gynecomastia is noted.  CT ABDOMEN AND PELVIS FINDINGS  Liver: A 2.2 cm right and left hepatic lobe cysts are noted. Additional subcentimeter hypodense renal lesions are too small to fully characterize.  Gallbladder: There is no cholelithiasis or biliary sludge. There is no pericholecystic fluid. There is no abnormal gallbladder wall thickening. There is no intrahepatic or extrahepatic biliary ductal dilatation.  Spleen: Unremarkable.  Pancreas: Unremarkable.  Adrenal glands: Unremarkable.  Kidneys: Multiple hypodense lesions of varying sizes are seen noted throughout the kidneys, compatible with cysts. Renal cortical thinning is also present bilaterally. There is no hydronephrosis. Several tiny nonobstructing calcific densities are noted bilaterally. A right lower quadrant renal transplant is present.  Bowel/gastrointestinal tract: There is colonic diverticulosis without definite evidence for acute diverticulitis. There is no abnormal bowel wall thickening. There is no evidence of bowel obstruction  Pelvis: There is no pelvic mass. The urinary bladder is decompressed. The prostate gland is mildly enlarged and measures approximately 5.1 x 2.5 cm.  Miscellaneous: Extensive vascular calcifications are present. There is mild ectasia of the abdominal aorta.  Osseous structures: Multilevel degenerative changes of the spine are present. Degenerative changes of the hips are also noted. No acute osseous abnormality is identified.  IMPRESSION: 1. Findings remain concerning for multifocal pneumonia. 2. Colonic diverticulosis without definite evidence for acute diverticulitis. 3. Chronic medical  renal disease. 4. Prostatic enlargement. 5. Aneurysmal dilatation of the ascending aorta. 6. Cardiomegaly. 7. 7 mm left upper lobe pulmonary nodule. If the patient is at high risk for bronchogenic carcinoma, follow-up chest CT at 3-52months is recommended. If the patient is at low risk for bronchogenic carcinoma, follow-up chest CT at 6-12 months is recommended. This recommendation follows the consensus statement: Guidelines for Management of Small Pulmonary Nodules Detected on CT Scans: A Statement from the [place] as published in Radiology 2005; 237:395-400. 8. Probable superimposed mild interstitial edema and trace bilateral pleural effusions.   Electronically Signed   By: Rosemarie Ax   On: 05/06/2014 08:39      Dg Chest Port 1 View  04/14/2014   CLINICAL DATA:  Status post left bronchoscopy lung biopsy.  EXAM: PORTABLE CHEST - 1 VIEW  COMPARISON:  04/11/2014  FINDINGS: Persistent bibasilar pulmonary airspace opacity again seen, left side greater than right. No evidence of pneumothorax. Cardiomegaly is stable. Bilateral subclavian stents again seen in place. Patient has undergone previous median sternotomy.  IMPRESSION: No significant change in bibasilar airspace disease and cardiomegaly. No pneumothorax identified.   Electronically Signed   By: Earle Gell M.D.   On: 04/14/2014 12:32   Dg Chest Port 1 View  04/11/2014   CLINICAL DATA:  Fever and chills.  EXAM: PORTABLE CHEST - 1 VIEW  COMPARISON:  Chest CT, 04/07/2014.  Chest radiographs, 04/06/2014.  FINDINGS: Patchy areas of left mid and lower lung consolidation and milder right lung base consolidation, this similar to the prior studies allowing for differences in technique and inspiratory volumes. No new areas of lung consolidation. No convincing pulmonary edema. No pleural effusion or pneumothorax.  Changes from cardiac surgery are stable. Cardiac silhouette is mildly enlarged. No mediastinal or hilar masses. Stable right subclavian  vein stent.  IMPRESSION: Persist consolidation in the left mid and lower lung and in the right lung base similar to the prior studies. This is most suggestive of multifocal pneumonia. No new abnormalities.   Electronically Signed   By: Lajean Manes M.D.   On: 04/11/2014 20:46   Dg C-arm Bronchoscopy  04/14/2014   CLINICAL DATA:    C-ARM BRONCHOSCOPY  Fluoroscopy was utilized by the requesting physician.  No radiographic  interpretation.      CBC  Recent Labs Lab 05/05/14 1726  WBC 4.5  HGB 9.1*  HCT 29.8*  PLT 121*  MCV 92.5  MCH 28.3  MCHC 30.5  RDW 16.5*  LYMPHSABS 1.4  MONOABS 0.3  EOSABS 0.1  BASOSABS 0.0  Chemistries   Recent Labs Lab 05/05/14 1726  NA 137  K 2.9*  CL 93*  CO2 30  GLUCOSE 143*  BUN 17  CREATININE 4.92*  CALCIUM 8.6  AST 28  ALT 9  ALKPHOS 56  BILITOT 0.4   ------------------------------------------------------------------------------------------------------------------ estimated creatinine clearance is 14.5 mL/min (by C-G formula based on Cr of 4.92). ------------------------------------------------------------------------------------------------------------------ No results for input(s): HGBA1C in the last 72 hours. ------------------------------------------------------------------------------------------------------------------ No results for input(s): CHOL, HDL, LDLCALC, TRIG, CHOLHDL, LDLDIRECT in the last 72 hours. ------------------------------------------------------------------------------------------------------------------ No results for input(s): TSH, T4TOTAL, T3FREE, THYROIDAB in the last 72 hours.  Invalid input(s): FREET3 ------------------------------------------------------------------------------------------------------------------  Recent Labs  05/06/14  FERRITIN 2490*    Coagulation profile No results for input(s): INR, PROTIME in the last 168 hours.  No results for input(s): DDIMER in the last 72  hours.  Cardiac Enzymes No results for input(s): CKMB, TROPONINI, MYOGLOBIN in the last 168 hours.  Invalid input(s): CK ------------------------------------------------------------------------------------------------------------------ Invalid input(s): POCBNP     Time Spent in minutes  35   SINGH,PRASHANT K M.D on 05/06/2014 at 10:46 AM  Between 7am to 7pm - Pager - [phone]  After 7pm go to [email or web address] - [facility] Office  [phone]

## 2014-05-06 NOTE — Consult Note (Signed)
PHARMACY CONSULT NOTE  Pharmacy Consult :  Heparin Indication : New Occlusive DVT of R-internal jugular vein  Allergies: Allergies  Allergen Reactions  . Lisinopril Swelling    Lips and tongue swell  . Niacin And Related Other (See Comments)    unknown  . Norvasc [Amlodipine Besylate] Rash    Flushing  . Penicillins Rash    Heparin Dosing weight : 71.5 kg  Vital Signs: BP 122/56 mmHg  Pulse 82  Temp(Src) 98.4 F (36.9 C) (Oral)  Resp 20  Ht 6' (1.829 m)  Wt 157 lb 10.1 oz (71.5 kg)  BMI 21.37 kg/m2  SpO2 96%  Active Problems: Principal Problem:   Fever of unknown origin (FUO) Active Problems:   Heart transplanted   DM (diabetes mellitus), type 2 with renal complications   ESRD on hemodialysis   Healthcare associated bacterial pneumonia   FUO (fever of unknown origin)   Protein-calorie malnutrition, severe   Labs:  Recent Labs  05/05/14 1726 05/06/14 1205 05/06/14 1830  HGB 9.1*  --  8.8*  HCT 29.8*  --  28.0*  PLT 121*  --  110*  APTT  --   --  48*  LABPROT  --   --  14.7  INR  --   --  1.13  CREATININE 4.92* 6.60*  --    Estimated Creatinine Clearance: 10.7 mL/min (by C-G formula based on Cr of 6.6).  Medical / Surgical History: Past Medical History  Diagnosis Date  . Diabetes mellitus   . Hypertension   . Myocardial infarction 1985; 1990  . Angina   . Coronary artery disease   . Dysrhythmia   . DVT (deep venous thrombosis) ~ 12/2010    LLE  . Anemia   . Blood transfusion 06/1999    post heart transplant  . Peripheral vascular disease   . CHF (congestive heart failure) 2001    beffore transplant  . Renal failure     Hemodialysis MWF last 2 years, sse dr [name] nephrology, goes to [facility]  . Renal insufficiency    Past Surgical History  Procedure Laterality Date  . Nephrectomy transplanted organ  2008  . Av fistula repair      rt. a=fore arm fistula  . Av fistula placement  ~ 01/2010    "this was my 2nd fistula  placement"  . Heart transplant  06/1999  . Coronary angioplasty with stent placement  1985; 1990  . Colonoscopy  03/17/2012    Procedure: COLONOSCOPY;  Surgeon: Lear Ng, MD;  Location: [facility];  Service: Endoscopy;  Laterality: N/A;  . Coronary artery bypass graft  1990    CABG X3  . Colonoscopy with propofol N/A 12/14/2013    Procedure: COLONOSCOPY WITH PROPOFOL;  Surgeon: Lear Ng, MD;  Location: [facility];  Service: Endoscopy;  Laterality: N/A;  . Video bronchoscopy Bilateral 04/14/2014    Procedure: VIDEO BRONCHOSCOPY WITH FLUORO;  Surgeon: Collene Gobble, MD;  Location: [facility];  Service: Cardiopulmonary;  Laterality: Bilateral;    Current Medication[s] Include: Medication PTA: Prescriptions prior to admission  Medication Sig Dispense Refill Last Dose  . aspirin EC 81 MG tablet Take 81 mg by mouth daily.     05/05/2014 at Unknown time  . atorvastatin (LIPITOR) 40 MG tablet Take 40 mg by mouth daily.     05/05/2014 at Unknown time  . azaTHIOprine (IMURAN) 50 MG tablet Take 75 mg by mouth every morning.    05/05/2014 at Unknown  time  . b complex-vitamin c-folic acid (NEPHRO-VITE) 0.8 MG TABS tablet Take 1 tablet by mouth at bedtime.   05/05/2014 at Unknown time  . calcium acetate (PHOSLO) 667 MG capsule Take 667 mg by mouth 3 (three) times daily with meals.    05/05/2014 at Unknown time  . carvedilol (COREG) 6.25 MG tablet Take 1 tablet (6.25 mg total) by mouth at bedtime. 30 tablet 0 05/04/2014 at 700 pm  . colesevelam (WELCHOL) 625 MG tablet Take 3,750 mg by mouth daily. Takes 6 tabs daily   05/05/2014 at Unknown time  . famotidine (PEPCID AC) 10 MG chewable tablet Chew 10 mg by mouth at bedtime.    05/04/2014 at Unknown time  . HUMALOG KWIKPEN 100 UNIT/ML SOPN Inject 6-10 Units into the skin 2 (two) times daily. Per sliding scale   05/05/2014 at Unknown time  . insulin glargine (LANTUS) 100 UNIT/ML injection Inject 20 Units into the skin every morning.    05/05/2014 at Unknown time  . isosorbide dinitrate (ISORDIL) 40 MG tablet Take 40 mg by mouth 3 (three) times daily.     05/05/2014 at Unknown time  . predniSONE (DELTASONE) 10 MG tablet Take 1 tablet (10 mg total) by mouth daily with breakfast. 30 tablet 0 05/04/2014 at Unknown time  . sirolimus (RAPAMUNE) 2 MG tablet Take 2 mg by mouth daily.   05/05/2014 at Unknown time    Scheduled:  Scheduled:  . aspirin EC  81 mg Oral Daily  . atorvastatin  40 mg Oral Daily  . azaTHIOprine  75 mg Oral q morning - 10a  . calcium acetate  667 mg Oral TID WC  . carvedilol  6.25 mg Oral QHS  . ceFEPIme (MAXIPIME) 2 GM IVP  2 g Intravenous Q M,W,F-2000  . colesevelam  1,875 mg Oral BID WC  . [START ON 05/11/2014] darbepoetin (ARANESP) injection - DIALYSIS  60 mcg Intravenous Q Wed-HD  . doxercalciferol  1 mcg Intravenous Q M,W,F-HD  . famotidine  10 mg Oral QHS  . insulin aspart  0-9 Units Subcutaneous TID WC  . isosorbide dinitrate  40 mg Oral TID WC  . multivitamin  1 tablet Oral QHS  . predniSONE  10 mg Oral Q breakfast  . sirolimus  2 mg Oral Daily  . [START ON 05/09/2014] vancomycin (VANCOCIN) 750 mg IVPB (Hemodialysis)  750 mg Intravenous Q M,W,F-HD   Infusion[s]: Infusions:   Antibiotic[s]: Anti-infectives    Start     Dose/Rate Route Frequency Ordered Stop   05/09/14 1200  vancomycin (VANCOCIN) 750 mg in sodium chloride 0.9 % 150 mL IVPB     750 mg150 mL/hr over 60 Minutes Intravenous Every M-W-F (Hemodialysis) 05/06/14 1639     05/06/14 2000  ceFEPIme (MAXIPIME) 2 g in dextrose 5 % 50 mL IVPB     2 g100 mL/hr over 30 Minutes Intravenous Every M-W-F (2000) 05/06/14 1641     05/06/14 1645  vancomycin (VANCOCIN) 1,250 mg in sodium chloride 0.9 % 250 mL IVPB     1,250 mg166.7 mL/hr over 90 Minutes Intravenous  Once 05/06/14 1639 05/06/14 1901      Assessment:  68 y.o.male with heart transplant, failed renal transplant who is on HD for ESRD who is admitted for FUO.  He has been started on  Cefepime and Vancomycin.  Vascular ultrasound of R-upper extremity indicates a new occlusive DVT involving the R-internal jugular vein.  Baseline coags significant for an elevated PTT of 48 seconds [likely due to just finished  HD session], Hgb of 8.8, and Platelets of 110.  Heparin ordered per pharmacy consult.  Goal of Therapy:  Heparin goal is Heparin level 0.3-0.7 units/ml.      Plan:  1. Begin Heparin infusion, No Load, at 1100 units/hr 2. Next Heparin level with the AM labs [8 hour level] 3. Daily Heparin Levels, Platelet counts, CBC.  Monitor for bleeding complications    Estelle June,  Pharm.D.. 05/06/2014,  8:10 PM

## 2014-05-06 NOTE — Consult Note (Signed)
PHARMACY CONSULT NOTE - INITIAL  Pharmacy Consult for :   Vancomycin, Cefepime Indication:  FUO in patient with heart transplant, failed renal transplant who is on HD for ESRD  Hospital Problems: Principal Problem:   Fever of unknown origin (FUO) Active Problems:   Heart transplanted   DM (diabetes mellitus), type 2 with renal complications   ESRD on hemodialysis   Healthcare associated bacterial pneumonia   FUO (fever of unknown origin)   Allergies: Allergies  Allergen Reactions  . Lisinopril Swelling    Lips and tongue swell  . Niacin And Related Other (See Comments)    unknown  . Norvasc [Amlodipine Besylate] Rash    Flushing  . Penicillins Rash    Patient Measurements: Height: 6' (182.9 cm) Weight: 157 lb 10.1 oz (71.5 kg) IBW/kg (Calculated) : 77.6  Vital Signs: BP 122/56 mmHg  Pulse 82  Temp(Src) 98.4 F (36.9 C) (Oral)  Resp 20  Ht 6' (1.829 m)  Wt 157 lb 10.1 oz (71.5 kg)  BMI 21.37 kg/m2  SpO2 96%  Labs:  Recent Labs  05/05/14 1726 05/06/14 1205  WBC 4.5  --   HGB 9.1*  --   PLT 121*  --   CREATININE 4.92* 6.60*   Estimated Creatinine Clearance: 10.7 mL/min (by C-G formula based on Cr of 6.6).   Microbiology: Recent Results (from the past 720 hour(s))  Blood culture (routine x 2)     Status: None   Collection Time: 04/06/14  6:55 PM  Result Value Ref Range Status   Specimen Description BLOOD RIGHT ARM  Final   Special Requests BOTTLES DRAWN AEROBIC AND ANAEROBIC 5ML  Final   Culture  Setup Time   Final    04/07/2014 01:07 Performed at [facility]    Culture   Final    NO GROWTH 5 DAYS Performed at [facility]    Report Status 04/13/2014 FINAL  Final  Blood culture (routine x 2)     Status: None   Collection Time: 04/06/14  7:12 PM  Result Value Ref Range Status   Specimen Description BLOOD RIGHT ARM  Final   Special Requests BOTTLES DRAWN AEROBIC AND ANAEROBIC 5ML  Final   Culture  Setup Time   Final     04/07/2014 01:07 Performed at [place]   Final    NO GROWTH 5 DAYS Performed at [facility]    Report Status 04/13/2014 FINAL  Final  MRSA PCR Screening     Status: None   Collection Time: 04/07/14  7:02 AM  Result Value Ref Range Status   MRSA by PCR NEGATIVE NEGATIVE Final    Comment:        The GeneXpert MRSA Assay (FDA approved for NASAL specimens only), is one component of a comprehensive MRSA colonization surveillance program. It is not intended to diagnose MRSA infection nor to guide or monitor treatment for MRSA infections.   Culture, blood (routine x 2)     Status: None   Collection Time: 04/11/14  9:01 PM  Result Value Ref Range Status   Specimen Description BLOOD LEFT ARM  Final   Special Requests BOTTLES DRAWN AEROBIC AND ANAEROBIC 10CC EACH  Final   Culture  Setup Time   Final    04/12/2014 01:15 Performed at [facility]   Final    NO GROWTH 5 DAYS Performed at [facility]    Report Status 04/18/2014 FINAL  Final  Fungus culture, blood     Status: None   Collection Time: 04/11/14  9:01 PM  Result Value Ref Range Status   Specimen Description BLOOD LEFT ARM  Final   Special Requests   Final    BOTTLES DRAWN AEROBIC AND ANAEROBIC 10CC ADDED ON 04/12/14 AT 1700   Culture   Final    NO GROWTH 7 DAYS Performed at [facility]    Report Status 04/20/2014 FINAL  Final  Culture, blood (routine x 2)     Status: None   Collection Time: 04/11/14  9:07 PM  Result Value Ref Range Status   Specimen Description BLOOD PORTA CATH  Final   Special Requests BOTTLES DRAWN AEROBIC ONLY 3CC  Final   Culture  Setup Time   Final    04/12/2014 01:15 Performed at [facility]    Culture   Final    NO GROWTH 5 DAYS Performed at [facility]    Report Status 04/18/2014 FINAL  Final  AFB culture with smear     Status: None (Preliminary result)   Collection Time: 04/14/14 12:01 PM   Result Value Ref Range Status   Specimen Description BRONCHIAL ALVEOLAR LAVAGE  Final   Special Requests Immunocompromised  Final   Acid Fast Smear   Final    NO ACID FAST BACILLI SEEN Performed at [facility]    Culture   Final    CULTURE WILL BE EXAMINED FOR 6 WEEKS BEFORE ISSUING A FINAL REPORT Performed at [facility]    Report Status PENDING  Incomplete  Culture, bal-quantitative     Status: None   Collection Time: 04/14/14 12:01 PM  Result Value Ref Range Status   Specimen Description BRONCHIAL ALVEOLAR LAVAGE  Final   Special Requests Immunocompromised  Final   Gram Stain   Final    FEW WBC PRESENT, PREDOMINANTLY PMN RARE SQUAMOUS EPITHELIAL CELLS PRESENT RARE GRAM POSITIVE COCCI IN PAIRS Performed at [place]   Final    >=100,000 COLONIES/ML Performed at [facility]    Culture   Final    Non-Pathogenic Oropharyngeal-type Flora Isolated. Performed at [facility]    Report Status 04/16/2014 FINAL  Final  Fungus Culture with Smear     Status: None (Preliminary result)   Collection Time: 04/14/14 12:01 PM  Result Value Ref Range Status   Specimen Description BRONCHIAL ALVEOLAR LAVAGE  Final   Special Requests Immunocompromised  Final   Fungal Smear   Final    NO YEAST OR FUNGAL ELEMENTS SEEN Performed at [facility]    Culture   Final    CULTURE IN PROGRESS FOR FOUR WEEKS Performed at [facility]    Report Status PENDING  Incomplete  MRSA PCR Screening     Status: None   Collection Time: 05/06/14 12:19 AM  Result Value Ref Range Status   MRSA by PCR NEGATIVE NEGATIVE Final    Medical/Surgical History: Past Medical History  Diagnosis Date  . Diabetes mellitus   . Hypertension   . Myocardial infarction 1985; 1990  . Angina   . Coronary artery disease   . Dysrhythmia   . DVT (deep venous thrombosis) ~ 12/2010    LLE  . Anemia   . Blood transfusion 06/1999    post heart  transplant  . Peripheral vascular disease   . CHF (congestive heart failure) 2001    beffore transplant  .  Renal failure     Hemodialysis MWF last 2 years, sse dr [name] nephrology, goes to [facility]  . Renal insufficiency    Past Surgical History  Procedure Laterality Date  . Nephrectomy transplanted organ  2008  . Av fistula repair      rt. a=fore arm fistula  . Av fistula placement  ~ 01/2010    "this was my 2nd fistula placement"  . Heart transplant  06/1999  . Coronary angioplasty with stent placement  1985; 1990  . Colonoscopy  03/17/2012    Procedure: COLONOSCOPY;  Surgeon: Lear Ng, MD;  Location: [facility];  Service: Endoscopy;  Laterality: N/A;  . Coronary artery bypass graft  1990    CABG X3  . Colonoscopy with propofol N/A 12/14/2013    Procedure: COLONOSCOPY WITH PROPOFOL;  Surgeon: Lear Ng, MD;  Location: [facility];  Service: Endoscopy;  Laterality: N/A;  . Video bronchoscopy Bilateral 04/14/2014    Procedure: VIDEO BRONCHOSCOPY WITH FLUORO;  Surgeon: Collene Gobble, MD;  Location: [facility];  Service: Cardiopulmonary;  Laterality: Bilateral;    Current Medication[s] Include: Medication PTA: Prescriptions prior to admission  Medication Sig Dispense Refill Last Dose  . aspirin EC 81 MG tablet Take 81 mg by mouth daily.     05/05/2014 at Unknown time  . atorvastatin (LIPITOR) 40 MG tablet Take 40 mg by mouth daily.     05/05/2014 at Unknown time  . azaTHIOprine (IMURAN) 50 MG tablet Take 75 mg by mouth every morning.    05/05/2014 at Unknown time  . b complex-vitamin c-folic acid (NEPHRO-VITE) 0.8 MG TABS tablet Take 1 tablet by mouth at bedtime.   05/05/2014 at Unknown time  . calcium acetate (PHOSLO) 667 MG capsule Take 667 mg by mouth 3 (three) times daily with meals.    05/05/2014 at Unknown time  . carvedilol (COREG) 6.25 MG tablet Take 1 tablet (6.25 mg total) by mouth at bedtime. 30 tablet 0 05/04/2014 at 700 pm   . colesevelam (WELCHOL) 625 MG tablet Take 3,750 mg by mouth daily. Takes 6 tabs daily   05/05/2014 at Unknown time  . famotidine (PEPCID AC) 10 MG chewable tablet Chew 10 mg by mouth at bedtime.    05/04/2014 at Unknown time  . HUMALOG KWIKPEN 100 UNIT/ML SOPN Inject 6-10 Units into the skin 2 (two) times daily. Per sliding scale   05/05/2014 at Unknown time  . insulin glargine (LANTUS) 100 UNIT/ML injection Inject 20 Units into the skin every morning.   05/05/2014 at Unknown time  . isosorbide dinitrate (ISORDIL) 40 MG tablet Take 40 mg by mouth 3 (three) times daily.     05/05/2014 at Unknown time  . predniSONE (DELTASONE) 10 MG tablet Take 1 tablet (10 mg total) by mouth daily with breakfast. 30 tablet 0 05/04/2014 at Unknown time  . sirolimus (RAPAMUNE) 2 MG tablet Take 2 mg by mouth daily.   05/05/2014 at Unknown time     Scheduled:  Scheduled:  . aspirin EC  81 mg Oral Daily  . atorvastatin  40 mg Oral Daily  . azaTHIOprine  75 mg Oral q morning - 10a  . calcium acetate  667 mg Oral TID WC  . carvedilol  6.25 mg Oral QHS  . ceFEPIme (MAXIPIME) 2 GM IVP  2 g Intravenous Q M,W,F-2000  . colesevelam  1,875 mg Oral BID WC  . [START ON 05/11/2014] darbepoetin (ARANESP) injection - DIALYSIS  60 mcg Intravenous Q Wed-HD  .  doxercalciferol  1 mcg Intravenous Q M,W,F-HD  . famotidine  10 mg Oral QHS  . heparin  5,000 Units Subcutaneous 3 times per day  . insulin aspart  0-9 Units Subcutaneous TID WC  . isosorbide dinitrate  40 mg Oral TID WC  . multivitamin  1 tablet Oral QHS  . predniSONE  10 mg Oral Q breakfast  . sirolimus  2 mg Oral Daily  . vancomycin (VANCOCIN) 1250 mg IVPB  1,250 mg Intravenous Once  . [START ON 05/09/2014] vancomycin (VANCOCIN) 750 mg IVPB (Hemodialysis)  750 mg Intravenous Q M,W,F-HD    Infusion[s]: Infusions:    Antibiotic[s]: Anti-infectives    Start     Dose/Rate Route Frequency Ordered Stop   05/09/14 1200  vancomycin (VANCOCIN) 750 mg in sodium  chloride 0.9 % 150 mL IVPB     750 mg150 mL/hr over 60 Minutes Intravenous Every M-W-F (Hemodialysis) 05/06/14 1639     05/06/14 2000  ceFEPIme (MAXIPIME) 2 g in dextrose 5 % 50 mL IVPB     2 g100 mL/hr over 30 Minutes Intravenous Every M-W-F (2000) 05/06/14 1641     05/06/14 1645  vancomycin (VANCOCIN) 1,250 mg in sodium chloride 0.9 % 250 mL IVPB     1,250 mg166.7 mL/hr over 90 Minutes Intravenous  Once 05/06/14 1639       Assessment:  68 yo M with hx of cardiac transplant 2001 (prednisone 10mg , imuran, sirolimus) with FUO over the last 2 months. S/P recent hospitalization in November with presumed pneumonia. Bronchoscopy, BAL and biopsy were all negative. He was treated with cefepime and vanco, d/c home on 11/20 on no anbx.   Now admitted with fever to 102.   CT chest showing multifocal pneumonia and a 28mm LUL pulmonary nodule  Pharmacy consulted to restart Vancomycin and Cefepime.  Patient has completed his HD session today  Goal of Therapy:   Pre-Dialysis level 15 - 25 mcg/ml Cefepime selected for infection and adjusted for renal function and dialysis schedule.   Plan: 1. Vancomycin 1250 mg IV x 1 today, then 2. Vancomycin 750 mg IV q HD on M-W-F. 3. Cefepime 2 gm IV q M-W-F.  4. Will adjust dosing pending any additional HD sessions other than usual MWF schedule. 5. Follow up HD schedule, cultures, Levels, clinical course and adjust as clinically indicated.   Avneet Ashmore, Craig Guess,  Pharm.D,    12/11/20154:42 PM

## 2014-05-07 DIAGNOSIS — E46 Unspecified protein-calorie malnutrition: Secondary | ICD-10-CM

## 2014-05-07 DIAGNOSIS — J159 Unspecified bacterial pneumonia: Secondary | ICD-10-CM

## 2014-05-07 DIAGNOSIS — N186 End stage renal disease: Secondary | ICD-10-CM

## 2014-05-07 LAB — GLUCOSE, CAPILLARY
GLUCOSE-CAPILLARY: 123 mg/dL — AB (ref 70–99)
GLUCOSE-CAPILLARY: 187 mg/dL — AB (ref 70–99)
GLUCOSE-CAPILLARY: 177 mg/dL — AB (ref 70–99)
Glucose-Capillary: 127 mg/dL — ABNORMAL HIGH (ref 70–99)
Glucose-Capillary: 106 mg/dL — ABNORMAL HIGH (ref 70–99)

## 2014-05-07 LAB — CBC
HCT: 26.9 % — ABNORMAL LOW (ref 39.0–52.0)
Hemoglobin: 8.4 g/dL — ABNORMAL LOW (ref 13.0–17.0)
MCH: 28.9 pg (ref 26.0–34.0)
MCHC: 31.2 g/dL (ref 30.0–36.0)
MCV: 92.4 fL (ref 78.0–100.0)
Platelets: 99 10*3/uL — ABNORMAL LOW (ref 150–400)
RBC: 2.91 MIL/uL — AB (ref 4.22–5.81)
RDW: 16.6 % — ABNORMAL HIGH (ref 11.5–15.5)
WBC: 4 10*3/uL (ref 4.0–10.5)

## 2014-05-07 LAB — HEPARIN LEVEL (UNFRACTIONATED)
Heparin Unfractionated: 0.47 IU/mL (ref 0.30–0.70)
Heparin Unfractionated: 0.58 IU/mL (ref 0.30–0.70)

## 2014-05-07 NOTE — Progress Notes (Signed)
Subjective:   Feeling better, glad he has some answers  Objective Filed Vitals:   05/06/14 1622 05/06/14 1641 05/06/14 2113 05/07/14 0410  BP: 138/67 122/56 124/56 114/57  Pulse: 82 82 85 84  Temp: 97.4 F (36.3 C) 98.4 F (36.9 C) 98.9 F (37.2 C) 99.2 F (37.3 C)  TempSrc: Oral Oral Oral Oral  Resp: 20 20 18 16   Height:      Weight: 71.5 kg (157 lb 10.1 oz)  71.487 kg (157 lb 9.6 oz)   SpO2: 98% 96% 99% 100%   Physical Exam General: alert and oriented. No acute distress. Sitting in chair Heart: RRR Lungs: CTA, unlabored Abdomen: soft, nontender +BS  Extremities: no LE edema Dialysis Access: R AVF +b/t R arm pitting edema   Dialysis Orders: MWF [facility] 71.5 kgs 2K/2.25ca 4hr 3000 Heparin R AVF 400/1.5  hectorol 1 mcg IV/HD Aranesp 60 q wends Venofer 100 x5- stop 12/11  Assessment/Plan: 1. Recurrent fever- Tmax 100.4  extensive workup during previous admissions. Following with Dr. Campbell/Infection disease. Blood culture no growth to date CT- concerning for PNA. LUL pulmonary nodule. No LE DVT. RUA occlusive DVT. Vanc and cefepime started 2. RUA DVT- heparin gtt 3. ESRD - MWF @ [facility]. HD pending today K+3.9- was replaced 4. Hypertension/volume - 114/57. Getting to edw outpt. Cont coreg 5. Anemia - hgb 8.4 cont ESA on wends and last dose of Fe today.- watch CBC/bleeding on hepatin gtt 6. 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 Cont hectorol and phoslo. Last pth 10/28 215 Nutrition - alb 2 renal/carb diet  6. Heart transplant- on immunosuppressive therapy 7. DM per primary  Shelle Iron, NP [place] [phone] 05/07/2014,8:59 AM  LOS: 2 days    Renal Attending: Stable. Fever wu in progress.  HD MWF [facility]    Additional Objective Labs: Basic Metabolic Panel:  Recent Labs Lab 05/05/14 1726 05/06/14 1205  NA 137 135*  K 2.9* 3.9  CL 93* 94*  CO2 30 26  GLUCOSE 143* 169*  BUN 17 24*  CREATININE 4.92* 6.60*  CALCIUM  8.6 8.5  PHOS  --  2.3   Liver Function Tests:  Recent Labs Lab 05/05/14 1726 05/06/14 1205  AST 28  --   ALT 9  --   ALKPHOS 56  --   BILITOT 0.4  --   PROT 5.9*  --   ALBUMIN 2.3* 2.0*   No results for input(s): LIPASE, AMYLASE in the last 168 hours. CBC:  Recent Labs Lab 05/05/14 1726 05/06/14 1830 05/07/14 0442  WBC 4.5 4.8 4.0  NEUTROABS 2.6  --   --   HGB 9.1* 8.8* 8.4*  HCT 29.8* 28.0* 26.9*  MCV 92.5 93.6 92.4  PLT 121* 110* 99*   Blood Culture    Component Value Date/Time   SDES BLOOD LEFT ARM 05/06/2014 1650   SPECREQUEST BOTTLES DRAWN AEROBIC AND ANAEROBIC 10CC 05/06/2014 1650   CULT  05/06/2014 1650           BLOOD CULTURE RECEIVED NO GROWTH TO DATE CULTURE WILL BE HELD FOR 5 DAYS BEFORE ISSUING A FINAL NEGATIVE REPORT Performed at [facility] PENDING 05/06/2014 1650    Cardiac Enzymes: No results for input(s): CKTOTAL, CKMB, CKMBINDEX, TROPONINI in the last 168 hours. CBG:  Recent Labs Lab 05/05/14 2327 05/06/14 1131 05/06/14 1640 05/06/14 2116 05/07/14 0749  GLUCAP 154* 169* 99 127* 106*   Iron Studies:  Recent Labs  05/06/14  FERRITIN 2490*   @  lablastinr3@ Studies/Results: Dg Chest 2 View  05/05/2014   CLINICAL DATA:  Fever of 102. Vomiting started today. Cough for 3 months. History of heart transplant in 2001. Diabetes, hypertension, CHF. Dialysis. CABG x3.  EXAM: CHEST  2 VIEW  COMPARISON:  04/14/2014  FINDINGS: Status post median sternotomy. Heart is enlarged. Stents are identified in the upper chest. There has been some improvement in aeration of the lung bases. Patchy densities persist however. No definite pleural effusions. No pulmonary edema.  IMPRESSION: Cardiomegaly. Persistent bilateral airspace filling opacities with some improvement in aeration since prior studies.   Electronically Signed   By: Shon Hale M.D.   On: 05/05/2014 21:00   Ct Chest W Contrast  05/06/2014   CLINICAL DATA:  68 year old  male with fever of unknown origin  EXAM: CT CHEST, ABDOMEN, AND PELVIS WITH CONTRAST  TECHNIQUE: Multidetector CT imaging of the chest, abdomen and pelvis was performed following the standard protocol during bolus administration of intravenous contrast.  CONTRAST:  69mL OMNIPAQUE IOHEXOL 300 MG/ML  SOLN  COMPARISON:  04/13/2014  FINDINGS: CT CHEST FINDINGS  Heart/mediastinum: Median sternotomy wires are present. The heart is mildly enlarged. Scattered atherosclerotic aortic and coronary artery calcifications are present. There is no pericardial effusion. There is mild aneurysmal dilatation of the ascending aorta measured 3.9 cm. There is no aortic dissection. Several collateral vessels are noted within the soft tissues of the left lateral chest. An unchanged 7 mm left upper lobe pulmonary nodule is present (series 2, image 32).  There are no pathologically axillary lymph nodes.  Chest: Upper lobe predominant emphysematous changes are present. Similar appearing bibasilar and linear patchy areas of airspace consolidation is noted. Interstitial thickening is also present. Small bilateral pleural effusions are present. There is no pneumothorax upper lobe predominant centrilobular and paraseptal emphysematous changes are present. No suspicious pulmonary nodules or masses are identified. A few scattered calcified pulmonary nodules are noted.  Gynecomastia is noted.  CT ABDOMEN AND PELVIS FINDINGS  Liver: A 2.2 cm right and left hepatic lobe cysts are noted. Additional subcentimeter hypodense renal lesions are too small to fully characterize.  Gallbladder: There is no cholelithiasis or biliary sludge. There is no pericholecystic fluid. There is no abnormal gallbladder wall thickening. There is no intrahepatic or extrahepatic biliary ductal dilatation.  Spleen: Unremarkable.  Pancreas: Unremarkable.  Adrenal glands: Unremarkable.  Kidneys: Multiple hypodense lesions of varying sizes are seen noted throughout the kidneys,  compatible with cysts. Renal cortical thinning is also present bilaterally. There is no hydronephrosis. Several tiny nonobstructing calcific densities are noted bilaterally. A right lower quadrant renal transplant is present.  Bowel/gastrointestinal tract: There is colonic diverticulosis without definite evidence for acute diverticulitis. There is no abnormal bowel wall thickening. There is no evidence of bowel obstruction  Pelvis: There is no pelvic mass. The urinary bladder is decompressed. The prostate gland is mildly enlarged and measures approximately 5.1 x 2.5 cm.  Miscellaneous: Extensive vascular calcifications are present. There is mild ectasia of the abdominal aorta.  Osseous structures: Multilevel degenerative changes of the spine are present. Degenerative changes of the hips are also noted. No acute osseous abnormality is identified.  IMPRESSION: 1. Findings remain concerning for multifocal pneumonia. 2. Colonic diverticulosis without definite evidence for acute diverticulitis. 3. Chronic medical renal disease. 4. Prostatic enlargement. 5. Aneurysmal dilatation of the ascending aorta. 6. Cardiomegaly. 7. 7 mm left upper lobe pulmonary nodule. If the patient is at high risk for bronchogenic carcinoma, follow-up chest CT at 3-33months is recommended.  If the patient is at low risk for bronchogenic carcinoma, follow-up chest CT at 6-12 months is recommended. This recommendation follows the consensus statement: Guidelines for Management of Small Pulmonary Nodules Detected on CT Scans: A Statement from the [place] as published in Radiology 2005; 237:395-400. 8. Probable superimposed mild interstitial edema and trace bilateral pleural effusions.   Electronically Signed   By: Rosemarie Ax   On: 05/06/2014 08:39   Ct Abdomen Pelvis W Contrast  05/06/2014   CLINICAL DATA:  68 year old male with fever of unknown origin  EXAM: CT CHEST, ABDOMEN, AND PELVIS WITH CONTRAST  TECHNIQUE: Multidetector CT  imaging of the chest, abdomen and pelvis was performed following the standard protocol during bolus administration of intravenous contrast.  CONTRAST:  92mL OMNIPAQUE IOHEXOL 300 MG/ML  SOLN  COMPARISON:  04/13/2014  FINDINGS: CT CHEST FINDINGS  Heart/mediastinum: Median sternotomy wires are present. The heart is mildly enlarged. Scattered atherosclerotic aortic and coronary artery calcifications are present. There is no pericardial effusion. There is mild aneurysmal dilatation of the ascending aorta measured 3.9 cm. There is no aortic dissection. Several collateral vessels are noted within the soft tissues of the left lateral chest. An unchanged 7 mm left upper lobe pulmonary nodule is present (series 2, image 32).  There are no pathologically axillary lymph nodes.  Chest: Upper lobe predominant emphysematous changes are present. Similar appearing bibasilar and linear patchy areas of airspace consolidation is noted. Interstitial thickening is also present. Small bilateral pleural effusions are present. There is no pneumothorax upper lobe predominant centrilobular and paraseptal emphysematous changes are present. No suspicious pulmonary nodules or masses are identified. A few scattered calcified pulmonary nodules are noted.  Gynecomastia is noted.  CT ABDOMEN AND PELVIS FINDINGS  Liver: A 2.2 cm right and left hepatic lobe cysts are noted. Additional subcentimeter hypodense renal lesions are too small to fully characterize.  Gallbladder: There is no cholelithiasis or biliary sludge. There is no pericholecystic fluid. There is no abnormal gallbladder wall thickening. There is no intrahepatic or extrahepatic biliary ductal dilatation.  Spleen: Unremarkable.  Pancreas: Unremarkable.  Adrenal glands: Unremarkable.  Kidneys: Multiple hypodense lesions of varying sizes are seen noted throughout the kidneys, compatible with cysts. Renal cortical thinning is also present bilaterally. There is no hydronephrosis. Several tiny  nonobstructing calcific densities are noted bilaterally. A right lower quadrant renal transplant is present.  Bowel/gastrointestinal tract: There is colonic diverticulosis without definite evidence for acute diverticulitis. There is no abnormal bowel wall thickening. There is no evidence of bowel obstruction  Pelvis: There is no pelvic mass. The urinary bladder is decompressed. The prostate gland is mildly enlarged and measures approximately 5.1 x 2.5 cm.  Miscellaneous: Extensive vascular calcifications are present. There is mild ectasia of the abdominal aorta.  Osseous structures: Multilevel degenerative changes of the spine are present. Degenerative changes of the hips are also noted. No acute osseous abnormality is identified.  IMPRESSION: 1. Findings remain concerning for multifocal pneumonia. 2. Colonic diverticulosis without definite evidence for acute diverticulitis. 3. Chronic medical renal disease. 4. Prostatic enlargement. 5. Aneurysmal dilatation of the ascending aorta. 6. Cardiomegaly. 7. 7 mm left upper lobe pulmonary nodule. If the patient is at high risk for bronchogenic carcinoma, follow-up chest CT at 3-63months is recommended. If the patient is at low risk for bronchogenic carcinoma, follow-up chest CT at 6-12 months is recommended. This recommendation follows the consensus statement: Guidelines for Management of Small Pulmonary Nodules Detected on CT Scans: A Statement from the [place]  as published in Radiology 2005; 237:395-400. 8. Probable superimposed mild interstitial edema and trace bilateral pleural effusions.   Electronically Signed   By: Rosemarie Ax   On: 05/06/2014 08:39   Medications: . heparin 1,100 Units/hr (05/06/14 2109)   . aspirin EC  81 mg Oral Daily  . atorvastatin  40 mg Oral Daily  . azaTHIOprine  75 mg Oral q morning - 10a  . calcium acetate  667 mg Oral TID WC  . carvedilol  6.25 mg Oral QHS  . ceFEPIme (MAXIPIME) 2 GM IVP  2 g Intravenous Q  M,W,F-2000  . colesevelam  1,875 mg Oral BID WC  . [START ON 05/11/2014] darbepoetin (ARANESP) injection - DIALYSIS  60 mcg Intravenous Q Wed-HD  . doxercalciferol  1 mcg Intravenous Q M,W,F-HD  . famotidine  10 mg Oral QHS  . insulin aspart  0-9 Units Subcutaneous TID WC  . isosorbide dinitrate  40 mg Oral TID WC  . multivitamin  1 tablet Oral QHS  . predniSONE  10 mg Oral Q breakfast  . sirolimus  2 mg Oral Daily  . [START ON 05/09/2014] vancomycin (VANCOCIN) 750 mg IVPB (Hemodialysis)  750 mg Intravenous Q M,W,F-HD

## 2014-05-07 NOTE — Progress Notes (Signed)
ANTICOAGULATION CONSULT NOTE - Follow Up Consult  Pharmacy Consult for Heparin  Indication: DVT, right internal jugular  Allergies  Allergen Reactions  . Lisinopril Swelling    Lips and tongue swell  . Niacin And Related Other (See Comments)    unknown  . Norvasc [Amlodipine Besylate] Rash    Flushing  . Penicillins Rash   Patient Measurements: Height: 6' (182.9 cm) Weight: 157 lb 9.6 oz (71.487 kg) IBW/kg (Calculated) : 77.6  Vital Signs: Temp: 99.2 F (37.3 C) (12/12 0410) Temp Source: Oral (12/12 0410) BP: 114/57 mmHg (12/12 0410) Pulse Rate: 84 (12/12 0410)  Labs:  Recent Labs  05/05/14 1726 05/06/14 1205 05/06/14 1830 05/07/14 0442  HGB 9.1*  --  8.8* 8.4*  HCT 29.8*  --  28.0* 26.9*  PLT 121*  --  110* 99*  APTT  --   --  48*  --   LABPROT  --   --  14.7  --   INR  --   --  1.13  --   HEPARINUNFRC  --   --   --  0.47  CREATININE 4.92* 6.60*  --   --     Assessment: Therapeutic heparin level x 1  Goal of Therapy:  Heparin level 0.3-0.7 units/ml Monitor platelets by anticoagulation protocol: Yes   Plan:  -Continue heparin at 1100 units/hr -1300 HL -Daily CBC/HL -Monitor for bleeding  Narda Bonds 05/07/2014,5:43 AM

## 2014-05-07 NOTE — Progress Notes (Signed)
Heparin drip started at 1100 units/hr.

## 2014-05-07 NOTE — Progress Notes (Signed)
ANTICOAGULATION CONSULT NOTE - Follow Up Consult  Pharmacy Consult for Heparin  Indication: DVT, right internal jugular  Allergies  Allergen Reactions  . Lisinopril Swelling    Lips and tongue swell  . Niacin And Related Other (See Comments)    unknown  . Norvasc [Amlodipine Besylate] Rash    Flushing  . Penicillins Rash   Patient Measurements: Height: 6' (182.9 cm) Weight: 157 lb 9.6 oz (71.487 kg) IBW/kg (Calculated) : 77.6  Vital Signs: Temp: 98.2 F (36.8 C) (12/12 0955) Temp Source: Oral (12/12 0955) BP: 151/66 mmHg (12/12 0955) Pulse Rate: 101 (12/12 0955)  Labs:  Recent Labs  05/05/14 1726 05/06/14 1205 05/06/14 1830 05/07/14 0442 05/07/14 1243  HGB 9.1*  --  8.8* 8.4*  --   HCT 29.8*  --  28.0* 26.9*  --   PLT 121*  --  110* 99*  --   APTT  --   --  48*  --   --   LABPROT  --   --  14.7  --   --   INR  --   --  1.13  --   --   HEPARINUNFRC  --   --   --  0.47 0.58  CREATININE 4.92* 6.60*  --   --   --    Medications: . heparin 1,100 Units/hr (05/06/14 2109)    Assessment: 68 y/o male with ESRD on a heparin drip for R IJ vein DVT. Heparin level is therapeutic at 0.58 on 1100 units/hr. No bleeding noted, Hb is stable, platelets have trended down to 99 - will watch closely.  Goal of Therapy:  Heparin level 0.3-0.7 units/ml Monitor platelets by anticoagulation protocol: Yes   Plan:  -Continue heparin drip at 1100 units/hr -Daily CBC/HL -Monitor for bleeding -F/u plan for long term anticoagulant  [facility], Pharm.D., BCPS Clinical Pharmacist Pager: [phone] 05/07/2014 2:21 PM

## 2014-05-07 NOTE — Progress Notes (Signed)
Patient ID: Brandon Sharp, male   DOB: [birth year], 68 y.o.   MRN: [id number]         [facility]    Date of Admission:  05/05/2014           Day 2 vancomycin         Day 2 cefepime  Principal Problem:   Fever of unknown origin (FUO) Active Problems:   Heart transplanted   DM (diabetes mellitus), type 2 with renal complications   ESRD on hemodialysis   Healthcare associated bacterial pneumonia   FUO (fever of unknown origin)   Protein-calorie malnutrition, severe   . aspirin EC  81 mg Oral Daily  . atorvastatin  40 mg Oral Daily  . azaTHIOprine  75 mg Oral q morning - 10a  . calcium acetate  667 mg Oral TID WC  . carvedilol  6.25 mg Oral QHS  . ceFEPIme (MAXIPIME) 2 GM IVP  2 g Intravenous Q M,W,F-2000  . colesevelam  1,875 mg Oral BID WC  . [START ON 05/11/2014] darbepoetin (ARANESP) injection - DIALYSIS  60 mcg Intravenous Q Wed-HD  . doxercalciferol  1 mcg Intravenous Q M,W,F-HD  . famotidine  10 mg Oral QHS  . insulin aspart  0-9 Units Subcutaneous TID WC  . isosorbide dinitrate  40 mg Oral TID WC  . multivitamin  1 tablet Oral QHS  . predniSONE  10 mg Oral Q breakfast  . sirolimus  2 mg Oral Daily  . [START ON 05/09/2014] vancomycin (VANCOCIN) 750 mg IVPB (Hemodialysis)  750 mg Intravenous Q M,W,F-HD    Subjective: He states that his fevers came back after he saw me in clinic on 05/02/2014. He states that he has not had any chills, sweats, new cough or shortness of breath. He has noted swelling of his right arm for the past few days.  Review of Systems: Pertinent items are noted in HPI.  Past Medical History  Diagnosis Date  . Diabetes mellitus   . Hypertension   . Myocardial infarction 1985; 1990  . Angina   . Coronary artery disease   . Dysrhythmia   . DVT (deep venous thrombosis) ~ 12/2010    LLE  . Anemia   . Blood transfusion 06/1999    post heart transplant  . Peripheral vascular disease   . CHF (congestive heart  failure) 2001    beffore transplant  . Renal failure     Hemodialysis MWF last 2 years, sse dr [name] nephrology, goes to [place] [facility]  . Renal insufficiency     History  Substance Use Topics  . Smoking status: Former Smoker -- 1.00 packs/day for 20 years    Types: Cigarettes    Quit date: 05/27/1988  . Smokeless tobacco: Never Used  . Alcohol Use: Yes     Comment: "Holidays"    Family History  Problem Relation Age of Onset  . Heart disease Mother   . Hyperlipidemia Mother   . Hypertension Mother   . Hyperlipidemia Father   . Hypertension Father   . Diabetes Brother   . Heart disease Brother     Heart Disease before age 24  . Hyperlipidemia Brother   . Hypertension Brother    Allergies  Allergen Reactions  . Lisinopril Swelling    Lips and tongue swell  . Niacin And Related Other (See Comments)    unknown  . Norvasc [Amlodipine Besylate] Rash    Flushing  . Penicillins Rash  OBJECTIVE: Blood pressure 151/66, pulse 101, temperature 98.2 F (36.8 C), temperature source Oral, resp. rate 18, height 6' (1.829 m), weight 157 lb 9.6 oz (71.487 kg), SpO2 91 %. General: He is in no distress watching television  Skin: No rash  Lungs: Clear  Cor: Regular S1 and S2 with early 1/6 systolic murmur  Abdomen: Soft and nontender right upper arm fistula appears normal. His arm is swollen   Lab Results Lab Results  Component Value Date   WBC 4.0 05/07/2014   HGB 8.4* 05/07/2014   HCT 26.9* 05/07/2014   MCV 92.4 05/07/2014   PLT 99* 05/07/2014    Lab Results  Component Value Date   CREATININE 6.60* 05/06/2014   BUN 24* 05/06/2014   NA 135* 05/06/2014   K 3.9 05/06/2014   CL 94* 05/06/2014   CO2 26 05/06/2014    Lab Results  Component Value Date   ALT 9 05/05/2014   AST 28 05/05/2014   ALKPHOS 56 05/05/2014   BILITOT 0.4 05/05/2014     Microbiology: Recent Results (from the past 240 hour(s))  Culture, blood (routine x 2)     Status: None  (Preliminary result)   Collection Time: 05/05/14  8:44 PM  Result Value Ref Range Status   Specimen Description BLOOD ARM LEFT  Final   Special Requests BOTTLES DRAWN AEROBIC AND ANAEROBIC 10CC  Final   Culture  Setup Time   Final    05/06/2014 00:38 Performed at [facility]    Culture   Final           BLOOD CULTURE RECEIVED NO GROWTH TO DATE CULTURE WILL BE HELD FOR 5 DAYS BEFORE ISSUING A FINAL NEGATIVE REPORT Performed at [facility]    Report Status PENDING  Incomplete  Culture, blood (routine x 2)     Status: None (Preliminary result)   Collection Time: 05/05/14  8:51 PM  Result Value Ref Range Status   Specimen Description BLOOD WRIST LEFT  Final   Special Requests BOTTLES DRAWN AEROBIC AND ANAEROBIC 5CC  Final   Culture  Setup Time   Final    05/06/2014 00:39 Performed at [facility]    Culture   Final           BLOOD CULTURE RECEIVED NO GROWTH TO DATE CULTURE WILL BE HELD FOR 5 DAYS BEFORE ISSUING A FINAL NEGATIVE REPORT Performed at [facility]    Report Status PENDING  Incomplete  MRSA PCR Screening     Status: None   Collection Time: 05/06/14 12:19 AM  Result Value Ref Range Status   MRSA by PCR NEGATIVE NEGATIVE Final    Comment:        The GeneXpert MRSA Assay (FDA approved for NASAL specimens only), is one component of a comprehensive MRSA colonization surveillance program. It is not intended to diagnose MRSA infection nor to guide or monitor treatment for MRSA infections.   Culture, blood (routine x 2)     Status: None (Preliminary result)   Collection Time: 05/06/14  4:50 PM  Result Value Ref Range Status   Specimen Description BLOOD LEFT ARM  Final   Special Requests BOTTLES DRAWN AEROBIC AND ANAEROBIC 10CC  Final   Culture  Setup Time   Final    05/06/2014 22:41 Performed at [facility]    Culture   Final           BLOOD CULTURE RECEIVED NO GROWTH TO DATE CULTURE WILL BE HELD  FOR 5 DAYS BEFORE ISSUING  A FINAL NEGATIVE REPORT Performed at [facility]    Report Status PENDING  Incomplete    Assessment: Mr. Ekholm is now been hospitalized 4 times over the past 6 weeks with fever. He seems to improve while on antibiotics. His most recent chest CT seems to show some slight worsening of his bibasilar infiltrates. His fever may be due to persistent pneumonia. However he's also been found to have an acute right internal jugular vein thrombosis. He could also have septic thrombophlebitis. His temperature seems to be down since empiric antibiotics were restarted.   Plan: Continue vancomycin and cefepime  Await final blood culture results  Michel Bickers, MD [facility] [place] [phone] pager   [phone] cell 05/07/2014, 1:08 PM

## 2014-05-07 NOTE — Consult Note (Signed)
[facility] Specialist of [facility]  Patient name: Brandon Sharp MRN: [id number] DOB: June 12, 1945 Sex: male  REASON FOR CONSULT: Right upper extremity swelling and fever  HPI: Brandon Sharp is a 68 y.o. male who was admitted on 05/05/2014 with a fever. Of note, he is on chronic immunosuppressive therapy as he has undergone a heart transplant in 2001. He has a failed kidney transplant and dialyzes using a right upper arm fistula on Monday Wednesdays and Fridays. He was noted to have swelling in his right upper extremity. A duplex was obtained. He was noted to have acute clot in the right internal jugular vein. The axillary and subclavian veins and the right did not have clot. He states that the right upper arm fistula, which is a brachial cephalic fistula, was placed approximately 3 years ago.  Currently he has no significant pain or paresthesias in the right upper extremity. He has also noted some swelling in the left upper extremity.  Past Medical History  Diagnosis Date  . Diabetes mellitus   . Hypertension   . Myocardial infarction 1985; 1990  . Angina   . Coronary artery disease   . Dysrhythmia   . DVT (deep venous thrombosis) ~ 12/2010    LLE  . Anemia   . Blood transfusion 06/1999    post heart transplant  . Peripheral vascular disease   . CHF (congestive heart failure) 2001    beffore transplant  . Renal failure     Hemodialysis MWF last 2 years, sse dr [name] nephrology, goes to [facility]  . Renal insufficiency    Family History  Problem Relation Age of Onset  . Heart disease Mother   . Hyperlipidemia Mother   . Hypertension Mother   . Hyperlipidemia Father   . Hypertension Father   . Diabetes Brother   . Heart disease Brother     Heart Disease before age 35  . Hyperlipidemia Brother   . Hypertension Brother    SOCIAL HISTORY: History  Substance Use Topics  . Smoking status: Former Smoker -- 1.00 packs/day for 20 years   Types: Cigarettes    Quit date: 05/27/1988  . Smokeless tobacco: Never Used  . Alcohol Use: Yes     Comment: "Holidays"   Allergies  Allergen Reactions  . Lisinopril Swelling    Lips and tongue swell  . Niacin And Related Other (See Comments)    unknown  . Norvasc [Amlodipine Besylate] Rash    Flushing  . Penicillins Rash   REVIEW OF SYSTEMS: Valu.Nieves ] denotes positive finding; [  ] denotes negative finding CARDIOVASCULAR:  [ ]  chest pain   [ ]  chest pressure   [ ]  palpitations   [ ]  orthopnea   [ ]  dyspnea on exertion   [ ]  claudication   [ ]  rest pain   [ ]  DVT   [ ]  phlebitis PULMONARY:   [ ]  productive cough   [ ]  asthma   [ ]  wheezing NEUROLOGIC:   [ ]  weakness  [ ]  paresthesias  [ ]  aphasia  [ ]  amaurosis  [ ]  dizziness HEMATOLOGIC:   [ ]  bleeding problems   [ ]  clotting disorders MUSCULOSKELETAL:  [ ]  joint pain   [ ]  joint swelling Valu.Nieves ] arm swelling GASTROINTESTINAL: [ ]   blood in stool  [ ]   hematemesis GENITOURINARY:  [ ]   dysuria  [ ]   hematuria PSYCHIATRIC:  [ ]  history of major depression INTEGUMENTARY:  [ ]   rashes  [ ]  ulcers CONSTITUTIONAL:  Valu.Nieves ] fever   [ ]  chills  PHYSICAL EXAM: Filed Vitals:   05/06/14 1641 05/06/14 2113 05/07/14 0410 05/07/14 0955  BP: 122/56 124/56 114/57 151/66  Pulse: 82 85 84 101  Temp: 98.4 F (36.9 C) 98.9 F (37.2 C) 99.2 F (37.3 C) 98.2 F (36.8 C)  TempSrc: Oral Oral Oral Oral  Resp: 20 18 16 18   Height:      Weight:  157 lb 9.6 oz (71.487 kg)    SpO2: 96% 99% 100% 91%   Body mass index is 21.37 kg/(m^2). GENERAL: The patient is a well-nourished male, in no acute distress. The vital signs are documented above. CARDIOVASCULAR: There is a regular rate and rhythm. He has moderate right upper extremity swelling. He has radial pulses bilaterally. He has mild left upper extremity swelling. His right upper arm brachiocephalic fistula is pulsatile. PULMONARY: There is good air exchange bilaterally without wheezing or rales. ABDOMEN:  Soft and non-tender with normal pitched bowel sounds.  MUSCULOSKELETAL: There are no major deformities or cyanosis. NEUROLOGIC: No focal weakness or paresthesias are detected. SKIN: There are no ulcers or rashes noted. PSYCHIATRIC: The patient has a normal affect.  DATA:  Lab Results  Component Value Date   WBC 4.0 05/07/2014   HGB 8.4* 05/07/2014   HCT 26.9* 05/07/2014   MCV 92.4 05/07/2014   PLT 99* 05/07/2014   Lab Results  Component Value Date   NA 135* 05/06/2014   K 3.9 05/06/2014   CL 94* 05/06/2014   CO2 26 05/06/2014   Lab Results  Component Value Date   CREATININE 6.60* 05/06/2014   Lab Results  Component Value Date   INR 1.13 05/06/2014   INR 1.01 03/27/2014   INR 1.05 10/01/2012   Lab Results  Component Value Date   HGBA1C 7.2* 04/12/2014   CBG (last 3)   Recent Labs  05/06/14 1640 05/06/14 2116 05/07/14 0749  GLUCAP 99 127* 106*   VENOUS DUPLEX: He has clot which appears to be acute in the right internal jugular vein. There is no clot in the subclavian or axillary vein on the right. He has no lower extremity DVT.  MEDICAL ISSUES:  RIGHT UPPER EXTREMITY SWELLING AND FEVER: He is currently afebrile. His upper arm access appears to be a fistula and I do not think that he has any prosthetic material in the right arm that I'm aware of. Given the swelling however, I have recommended that we proceed with a fistulogram to look for a outflow stenosis area the right upper arm fistula is pulsatile. I will schedule this for early Monday afternoon I have spoken with the dialysis center and they  Will dialyze him first thing Monday morning so that he can undergo the procedure after dialysis.  [facility] [place] Beeper: [phone]

## 2014-05-07 NOTE — Progress Notes (Signed)
Patient Demographics  Brandon Sharp, is a 68 y.o. male, DOB - 1946-04-25, VQM:[id number]  Admit date - 05/05/2014   Admitting Physician Orvan Falconer, MD  Outpatient Primary MD for the patient is [name], MD  LOS - 2   Chief Complaint  Patient presents with  . 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  He appeared nontoxic and cultures thus far negative, his right arm was swollen and be ordered venous ultrasound to rule out DVT as a source of fevers. He does have a right IJ clot. ID, renal following here. I have consulted Dr. Scot Dock vascular over the phone. For now heparin drip and monitor.    Subjective:   Con [place] today has, No headache, No chest pain, No abdominal pain - No Nausea, No new weakness tingling or numbness, No Cough - SOB.    Assessment & Plan    1. Fever of unknown origin (FUO) - ongoing for 4-6 weeks, so far extensive workup including blood cultures, urine cultures, bronchial with negative AFB and fungal smears so far, question multifocal pneumonia on CT scan, have requested ID to evaluate and comment. Will defer to ID for the management of this problem, ? If RIJ clot causing fevers. See below.   2. Heart transplant, failed kidney transplant. Continue home immunosuppressants. Follow with transplant clinic post discharge.   3. ESRD. On Monday Wednesday Friday schedule. Renal consulted.   4. DM type II. On sliding scale   Lab Results  Component Value Date   HGBA1C 7.2* 04/12/2014      CBG (last 3)   Recent Labs  05/06/14 1640 05/06/14 2116 05/07/14 0749  GLUCAP 99 127* 106*     5. Dyslipidemia. Continue home dose statin.    6. CAD. No acute issues. On Imdur continue.   7. RIJ clot - discussed with vascular surgeon Dr. Scot Dock on 05/07/2014, he will do AV fistulogram on Monday, agrees with IV heparin for now, question if he had a catheter in his right IJ in the past and question if this clot is chronic. However for now IV heparin to look at the response. If fevers subside this could be the source of his fever of unknown origin.      Code Status: Full  Family Communication: None present  Disposition Plan: Home   Procedures CT scan chest abdomen pelvis, RU extremity venous duplex   Consults  ID, Renal, Vascular Dr Scot Dock   Medications  Scheduled Meds: . aspirin EC  81 mg Oral Daily  . atorvastatin  40 mg Oral Daily  . azaTHIOprine  75 mg Oral q morning - 10a  . calcium acetate  667 mg Oral TID WC  . carvedilol  6.25 mg Oral QHS  . ceFEPIme (MAXIPIME) 2 GM IVP  2 g Intravenous Q M,W,F-2000  . colesevelam  1,875 mg Oral BID WC  . [START ON 05/11/2014] darbepoetin (ARANESP) injection - DIALYSIS  60 mcg Intravenous Q Wed-HD  . doxercalciferol  1 mcg Intravenous  Q M,W,F-HD  . famotidine  10 mg Oral QHS  . insulin aspart  0-9 Units Subcutaneous TID WC  . isosorbide dinitrate  40 mg Oral TID WC  . multivitamin  1 tablet Oral QHS  . predniSONE  10 mg Oral Q breakfast  . sirolimus  2 mg Oral Daily  . [START ON 05/09/2014] vancomycin (VANCOCIN) 750 mg IVPB (Hemodialysis)  750 mg Intravenous Q M,W,F-HD   Continuous Infusions: . heparin 1,100 Units/hr (05/06/14 2109)   PRN Meds:.acetaminophen **OR** acetaminophen  DVT Prophylaxis    Heparin    Lab Results  Component Value Date   PLT 99* 05/07/2014    Antibiotics     Anti-infectives    Start     Dose/Rate Route Frequency Ordered Stop   05/09/14 1200  vancomycin (VANCOCIN) 750 mg in  sodium chloride 0.9 % 150 mL IVPB     750 mg150 mL/hr over 60 Minutes Intravenous Every M-W-F (Hemodialysis) 05/06/14 1639     05/06/14 2000  ceFEPIme (MAXIPIME) 2 g in dextrose 5 % 50 mL IVPB     2 g100 mL/hr over 30 Minutes Intravenous Every M-W-F (2000) 05/06/14 1641     05/06/14 1645  vancomycin (VANCOCIN) 1,250 mg in sodium chloride 0.9 % 250 mL IVPB     1,250 mg166.7 mL/hr over 90 Minutes Intravenous  Once 05/06/14 1639 05/06/14 1901          Objective:   Filed Vitals:   05/06/14 1622 05/06/14 1641 05/06/14 2113 05/07/14 0410  BP: 138/67 122/56 124/56 114/57  Pulse: 82 82 85 84  Temp: 97.4 F (36.3 C) 98.4 F (36.9 C) 98.9 F (37.2 C) 99.2 F (37.3 C)  TempSrc: Oral Oral Oral Oral  Resp: 20 20 18 16   Height:      Weight: 71.5 kg (157 lb 10.1 oz)  71.487 kg (157 lb 9.6 oz)   SpO2: 98% 96% 99% 100%    Wt Readings from Last 3 Encounters:  05/06/14 71.487 kg (157 lb 9.6 oz)  05/03/14 71.895 kg (158 lb 8 oz)  04/26/14 73.936 kg (163 lb)     Intake/Output Summary (Last 24 hours) at 05/07/14 0934 Last data filed at 05/07/14 0900  Gross per 24 hour  Intake 910.35 ml  Output   1200 ml  Net -289.65 ml     Physical Exam  Awake Alert, Oriented X 3, No new F.N deficits, Normal affect [place].AT,PERRAL Supple Neck,No JVD, No cervical lymphadenopathy appriciated.  Symmetrical Chest wall movement, Good air movement bilaterally, CTAB RRR,No Gallops,Rubs , positive systolic Murmurs, No Parasternal Heave +ve B.Sounds, Abd Soft, No tenderness, No organomegaly appriciated, No rebound - guarding or rigidity. No Cyanosis, Clubbing or edema, No new Rash or bruise   , R Arm swollen   Data Review   Micro Results Recent Results (from the past 240 hour(s))  Culture, blood (routine x 2)     Status: None (Preliminary result)   Collection Time: 05/05/14  8:44 PM  Result Value Ref Range Status   Specimen Description BLOOD ARM LEFT  Final   Special Requests BOTTLES DRAWN AEROBIC AND  ANAEROBIC 10CC  Final   Culture  Setup Time   Final    05/06/2014 00:38 Performed at [facility]    Culture   Final           BLOOD CULTURE RECEIVED NO GROWTH TO DATE CULTURE WILL BE HELD FOR 5 DAYS BEFORE ISSUING A FINAL NEGATIVE REPORT Performed at [facility]  Report Status PENDING  Incomplete  Culture, blood (routine x 2)     Status: None (Preliminary result)   Collection Time: 05/05/14  8:51 PM  Result Value Ref Range Status   Specimen Description BLOOD WRIST LEFT  Final   Special Requests BOTTLES DRAWN AEROBIC AND ANAEROBIC 5CC  Final   Culture  Setup Time   Final    05/06/2014 00:39 Performed at [facility]    Culture   Final           BLOOD CULTURE RECEIVED NO GROWTH TO DATE CULTURE WILL BE HELD FOR 5 DAYS BEFORE ISSUING A FINAL NEGATIVE REPORT Performed at [facility]    Report Status PENDING  Incomplete  MRSA PCR Screening     Status: None   Collection Time: 05/06/14 12:19 AM  Result Value Ref Range Status   MRSA by PCR NEGATIVE NEGATIVE Final    Comment:        The GeneXpert MRSA Assay (FDA approved for NASAL specimens only), is one component of a comprehensive MRSA colonization surveillance program. It is not intended to diagnose MRSA infection nor to guide or monitor treatment for MRSA infections.   Culture, blood (routine x 2)     Status: None (Preliminary result)   Collection Time: 05/06/14  4:50 PM  Result Value Ref Range Status   Specimen Description BLOOD LEFT ARM  Final   Special Requests BOTTLES DRAWN AEROBIC AND ANAEROBIC 10CC  Final   Culture  Setup Time   Final    05/06/2014 22:41 Performed at [facility]    Culture   Final           BLOOD CULTURE RECEIVED NO GROWTH TO DATE CULTURE WILL BE HELD FOR 5 DAYS BEFORE ISSUING A FINAL NEGATIVE REPORT Performed at [facility]    Report Status PENDING  Incomplete    Radiology Reports Dg Chest 2 View  05/05/2014   CLINICAL DATA:  Fever of  102. Vomiting started today. Cough for 3 months. History of heart transplant in 2001. Diabetes, hypertension, CHF. Dialysis. CABG x3.  EXAM: CHEST  2 VIEW  COMPARISON:  04/14/2014  FINDINGS: Status post median sternotomy. Heart is enlarged. Stents are identified in the upper chest. There has been some improvement in aeration of the lung bases. Patchy densities persist however. No definite pleural effusions. No pulmonary edema.  IMPRESSION: Cardiomegaly. Persistent bilateral airspace filling opacities with some improvement in aeration since prior studies.   Electronically Signed   By: Shon Hale M.D.   On: 05/05/2014 21:00     Ct Chest W Contrast  05/06/2014   CLINICAL DATA:  68 year old male with fever of unknown origin  EXAM: CT CHEST, ABDOMEN, AND PELVIS WITH CONTRAST  TECHNIQUE: Multidetector CT imaging of the chest, abdomen and pelvis was performed following the standard protocol during bolus administration of intravenous contrast.  CONTRAST:  21mL OMNIPAQUE IOHEXOL 300 MG/ML  SOLN  COMPARISON:  04/13/2014  FINDINGS: CT CHEST FINDINGS  Heart/mediastinum: Median sternotomy wires are present. The heart is mildly enlarged. Scattered atherosclerotic aortic and coronary artery calcifications are present. There is no pericardial effusion. There is mild aneurysmal dilatation of the ascending aorta measured 3.9 cm. There is no aortic dissection. Several collateral vessels are noted within the soft tissues of the left lateral chest. An unchanged 7 mm left upper lobe pulmonary nodule is present (series 2, image 32).  There are no pathologically axillary lymph nodes.  Chest: Upper lobe predominant emphysematous changes  are present. Similar appearing bibasilar and linear patchy areas of airspace consolidation is noted. Interstitial thickening is also present. Small bilateral pleural effusions are present. There is no pneumothorax upper lobe predominant centrilobular and paraseptal emphysematous changes are present. No  suspicious pulmonary nodules or masses are identified. A few scattered calcified pulmonary nodules are noted.  Gynecomastia is noted.  CT ABDOMEN AND PELVIS FINDINGS  Liver: A 2.2 cm right and left hepatic lobe cysts are noted. Additional subcentimeter hypodense renal lesions are too small to fully characterize.  Gallbladder: There is no cholelithiasis or biliary sludge. There is no pericholecystic fluid. There is no abnormal gallbladder wall thickening. There is no intrahepatic or extrahepatic biliary ductal dilatation.  Spleen: Unremarkable.  Pancreas: Unremarkable.  Adrenal glands: Unremarkable.  Kidneys: Multiple hypodense lesions of varying sizes are seen noted throughout the kidneys, compatible with cysts. Renal cortical thinning is also present bilaterally. There is no hydronephrosis. Several tiny nonobstructing calcific densities are noted bilaterally. A right lower quadrant renal transplant is present.  Bowel/gastrointestinal tract: There is colonic diverticulosis without definite evidence for acute diverticulitis. There is no abnormal bowel wall thickening. There is no evidence of bowel obstruction  Pelvis: There is no pelvic mass. The urinary bladder is decompressed. The prostate gland is mildly enlarged and measures approximately 5.1 x 2.5 cm.  Miscellaneous: Extensive vascular calcifications are present. There is mild ectasia of the abdominal aorta.  Osseous structures: Multilevel degenerative changes of the spine are present. Degenerative changes of the hips are also noted. No acute osseous abnormality is identified.  IMPRESSION: 1. Findings remain concerning for multifocal pneumonia. 2. Colonic diverticulosis without definite evidence for acute diverticulitis. 3. Chronic medical renal disease. 4. Prostatic enlargement. 5. Aneurysmal dilatation of the ascending aorta. 6. Cardiomegaly. 7. 7 mm left upper lobe pulmonary nodule. If the patient is at high risk for bronchogenic carcinoma, follow-up chest CT  at 3-51months is recommended. If the patient is at low risk for bronchogenic carcinoma, follow-up chest CT at 6-12 months is recommended. This recommendation follows the consensus statement: Guidelines for Management of Small Pulmonary Nodules Detected on CT Scans: A Statement from the [place] as published in Radiology 2005; 237:395-400. 8. Probable superimposed mild interstitial edema and trace bilateral pleural effusions.   Electronically Signed   By: Rosemarie Ax   On: 05/06/2014 08:39         Ct Abdomen Pelvis W Contrast  05/06/2014   CLINICAL DATA:  68 year old male with fever of unknown origin  EXAM: CT CHEST, ABDOMEN, AND PELVIS WITH CONTRAST  TECHNIQUE: Multidetector CT imaging of the chest, abdomen and pelvis was performed following the standard protocol during bolus administration of intravenous contrast.  CONTRAST:  36mL OMNIPAQUE IOHEXOL 300 MG/ML  SOLN  COMPARISON:  04/13/2014  FINDINGS: CT CHEST FINDINGS  Heart/mediastinum: Median sternotomy wires are present. The heart is mildly enlarged. Scattered atherosclerotic aortic and coronary artery calcifications are present. There is no pericardial effusion. There is mild aneurysmal dilatation of the ascending aorta measured 3.9 cm. There is no aortic dissection. Several collateral vessels are noted within the soft tissues of the left lateral chest. An unchanged 7 mm left upper lobe pulmonary nodule is present (series 2, image 32).  There are no pathologically axillary lymph nodes.  Chest: Upper lobe predominant emphysematous changes are present. Similar appearing bibasilar and linear patchy areas of airspace consolidation is noted. Interstitial thickening is also present. Small bilateral pleural effusions are present. There is no pneumothorax upper lobe predominant centrilobular and paraseptal  emphysematous changes are present. No suspicious pulmonary nodules or masses are identified. A few scattered calcified pulmonary nodules are  noted.  Gynecomastia is noted.  CT ABDOMEN AND PELVIS FINDINGS  Liver: A 2.2 cm right and left hepatic lobe cysts are noted. Additional subcentimeter hypodense renal lesions are too small to fully characterize.  Gallbladder: There is no cholelithiasis or biliary sludge. There is no pericholecystic fluid. There is no abnormal gallbladder wall thickening. There is no intrahepatic or extrahepatic biliary ductal dilatation.  Spleen: Unremarkable.  Pancreas: Unremarkable.  Adrenal glands: Unremarkable.  Kidneys: Multiple hypodense lesions of varying sizes are seen noted throughout the kidneys, compatible with cysts. Renal cortical thinning is also present bilaterally. There is no hydronephrosis. Several tiny nonobstructing calcific densities are noted bilaterally. A right lower quadrant renal transplant is present.  Bowel/gastrointestinal tract: There is colonic diverticulosis without definite evidence for acute diverticulitis. There is no abnormal bowel wall thickening. There is no evidence of bowel obstruction  Pelvis: There is no pelvic mass. The urinary bladder is decompressed. The prostate gland is mildly enlarged and measures approximately 5.1 x 2.5 cm.  Miscellaneous: Extensive vascular calcifications are present. There is mild ectasia of the abdominal aorta.  Osseous structures: Multilevel degenerative changes of the spine are present. Degenerative changes of the hips are also noted. No acute osseous abnormality is identified.  IMPRESSION: 1. Findings remain concerning for multifocal pneumonia. 2. Colonic diverticulosis without definite evidence for acute diverticulitis. 3. Chronic medical renal disease. 4. Prostatic enlargement. 5. Aneurysmal dilatation of the ascending aorta. 6. Cardiomegaly. 7. 7 mm left upper lobe pulmonary nodule. If the patient is at high risk for bronchogenic carcinoma, follow-up chest CT at 3-24months is recommended. If the patient is at low risk for bronchogenic carcinoma, follow-up chest  CT at 6-12 months is recommended. This recommendation follows the consensus statement: Guidelines for Management of Small Pulmonary Nodules Detected on CT Scans: A Statement from the [place] as published in Radiology 2005; 237:395-400. 8. Probable superimposed mild interstitial edema and trace bilateral pleural effusions.   Electronically Signed   By: Rosemarie Ax   On: 05/06/2014 08:39      Dg Chest Port 1 View  04/14/2014   CLINICAL DATA:  Status post left bronchoscopy lung biopsy.  EXAM: PORTABLE CHEST - 1 VIEW  COMPARISON:  04/11/2014  FINDINGS: Persistent bibasilar pulmonary airspace opacity again seen, left side greater than right. No evidence of pneumothorax. Cardiomegaly is stable. Bilateral subclavian stents again seen in place. Patient has undergone previous median sternotomy.  IMPRESSION: No significant change in bibasilar airspace disease and cardiomegaly. No pneumothorax identified.   Electronically Signed   By: Earle Gell M.D.   On: 04/14/2014 12:32   Dg Chest Port 1 View  04/11/2014   CLINICAL DATA:  Fever and chills.  EXAM: PORTABLE CHEST - 1 VIEW  COMPARISON:  Chest CT, 04/07/2014.  Chest radiographs, 04/06/2014.  FINDINGS: Patchy areas of left mid and lower lung consolidation and milder right lung base consolidation, this similar to the prior studies allowing for differences in technique and inspiratory volumes. No new areas of lung consolidation. No convincing pulmonary edema. No pleural effusion or pneumothorax.  Changes from cardiac surgery are stable. Cardiac silhouette is mildly enlarged. No mediastinal or hilar masses. Stable right subclavian vein stent.  IMPRESSION: Persist consolidation in the left mid and lower lung and in the right lung base similar to the prior studies. This is most suggestive of multifocal pneumonia. No new abnormalities.   Electronically  Signed   By: Lajean Manes M.D.   On: 04/11/2014 20:46   Dg C-arm Bronchoscopy  04/14/2014   CLINICAL  DATA:    C-ARM BRONCHOSCOPY  Fluoroscopy was utilized by the requesting physician.  No radiographic  interpretation.      CBC  Recent Labs Lab 05/05/14 1726 05/06/14 1830 05/07/14 0442  WBC 4.5 4.8 4.0  HGB 9.1* 8.8* 8.4*  HCT 29.8* 28.0* 26.9*  PLT 121* 110* 99*  MCV 92.5 93.6 92.4  MCH 28.3 29.4 28.9  MCHC 30.5 31.4 31.2  RDW 16.5* 16.6* 16.6*  LYMPHSABS 1.4  --   --   MONOABS 0.3  --   --   EOSABS 0.1  --   --   BASOSABS 0.0  --   --     Chemistries   Recent Labs Lab 05/05/14 1726 05/06/14 1205  NA 137 135*  K 2.9* 3.9  CL 93* 94*  CO2 30 26  GLUCOSE 143* 169*  BUN 17 24*  CREATININE 4.92* 6.60*  CALCIUM 8.6 8.5  AST 28  --   ALT 9  --   ALKPHOS 56  --   BILITOT 0.4  --    ------------------------------------------------------------------------------------------------------------------ estimated creatinine clearance is 10.7 mL/min (by C-G formula based on Cr of 6.6). ------------------------------------------------------------------------------------------------------------------ No results for input(s): HGBA1C in the last 72 hours. ------------------------------------------------------------------------------------------------------------------ No results for input(s): CHOL, HDL, LDLCALC, TRIG, CHOLHDL, LDLDIRECT in the last 72 hours. ------------------------------------------------------------------------------------------------------------------ No results for input(s): TSH, T4TOTAL, T3FREE, THYROIDAB in the last 72 hours.  Invalid input(s): FREET3 ------------------------------------------------------------------------------------------------------------------  Recent Labs  05/06/14  FERRITIN 2490*    Coagulation profile  Recent Labs Lab 05/06/14 1830  INR 1.13    No results for input(s): DDIMER in the last 72 hours.  Cardiac Enzymes No results for input(s): CKMB, TROPONINI, MYOGLOBIN in the last 168 hours.  Invalid input(s):  CK ------------------------------------------------------------------------------------------------------------------ Invalid input(s): POCBNP     Time Spent in minutes  35   Jasneet Schobert K M.D on 05/07/2014 at 9:34 AM  Between 7am to 7pm - Pager - [phone]  After 7pm go to [email or web address] - [facility] Office  [phone]

## 2014-05-08 ENCOUNTER — Inpatient Hospital Stay (HOSPITAL_COMMUNITY): Payer: Medicare Other

## 2014-05-08 LAB — CBC
HCT: 24.5 % — ABNORMAL LOW (ref 39.0–52.0)
Hemoglobin: 7.6 g/dL — ABNORMAL LOW (ref 13.0–17.0)
MCH: 28.4 pg (ref 26.0–34.0)
MCHC: 31 g/dL (ref 30.0–36.0)
MCV: 91.4 fL (ref 78.0–100.0)
PLATELETS: 96 10*3/uL — AB (ref 150–400)
RBC: 2.68 MIL/uL — ABNORMAL LOW (ref 4.22–5.81)
RDW: 16.5 % — ABNORMAL HIGH (ref 11.5–15.5)
WBC: 2.8 10*3/uL — ABNORMAL LOW (ref 4.0–10.5)

## 2014-05-08 LAB — GLUCOSE, CAPILLARY
GLUCOSE-CAPILLARY: 120 mg/dL — AB (ref 70–99)
Glucose-Capillary: 117 mg/dL — ABNORMAL HIGH (ref 70–99)
Glucose-Capillary: 170 mg/dL — ABNORMAL HIGH (ref 70–99)
Glucose-Capillary: 145 mg/dL — ABNORMAL HIGH (ref 70–99)

## 2014-05-08 LAB — HEPARIN LEVEL (UNFRACTIONATED): Heparin Unfractionated: 0.6 IU/mL (ref 0.30–0.70)

## 2014-05-08 MED ORDER — DARBEPOETIN ALFA 200 MCG/0.4ML IJ SOSY
200.0000 ug | PREFILLED_SYRINGE | INTRAMUSCULAR | Status: DC
  Administered 2014-05-09: 200 ug via INTRAVENOUS
  Filled 2014-05-08: qty 0.4

## 2014-05-08 MED ORDER — DARBEPOETIN ALFA 150 MCG/0.3ML IJ SOSY: 150.0000 ug | PREFILLED_SYRINGE | INTRAMUSCULAR | Status: DC

## 2014-05-08 MED ORDER — HEPARIN (PORCINE) IN NACL 100-0.45 UNIT/ML-% IJ SOLN
1050.0000 [IU]/h | INTRAMUSCULAR | Status: DC
  Administered 2014-05-08: 1050 [IU]/h via INTRAVENOUS

## 2014-05-08 NOTE — Progress Notes (Signed)
   VASCULAR SURGERY ASSESSMENT & PLAN:  * I have scheduled him for a fistulogram of his right upper extremity AV fistula tomorrow and possible venoplasty if he is found to have a outflow stenosis. This is scheduled with Dr. Adele Barthel. I have discussed the procedure and potential complications with the patient who is agreeable to proceed.  SUBJECTIVE: he notes that the right upper extremity swelling has improved.  PHYSICAL EXAM: Filed Vitals:   05/07/14 0955 05/07/14 1737 05/07/14 2103 05/08/14 0435  BP: 151/66 112/60 117/54 108/51  Pulse: 101 87 84 80  Temp: 98.2 F (36.8 C) 98.7 F (37.1 C) 98.3 F (36.8 C) 98.2 F (36.8 C)  TempSrc: Oral Oral Oral Oral  Resp: 18 20 17 16   Height:      Weight:   163 lb (73.936 kg)   SpO2: 91% 96% 100% 100%   Right brachiocephalic AV fistula is pulsatile. Right upper extremity swelling is slightly improved.  LABS: Lab Results  Component Value Date   WBC 2.8* 05/08/2014   HGB 7.6* 05/08/2014   HCT 24.5* 05/08/2014   MCV 91.4 05/08/2014   PLT 96* 05/08/2014   Lab Results  Component Value Date   CREATININE 6.60* 05/06/2014   Lab Results  Component Value Date   INR 1.13 05/06/2014   CBG (last 3)   Recent Labs  05/07/14 1621 05/07/14 2103 05/08/14 0736  GLUCAP 187* 177* 117*    Principal Problem:   Fever of unknown origin (FUO) Active Problems:   Heart transplanted   DM (diabetes mellitus), type 2 with renal complications   ESRD on hemodialysis   Healthcare associated bacterial pneumonia   FUO (fever of unknown origin)   Protein-calorie malnutrition, severe   Gae Gallop Beeper: [phone] 05/08/2014

## 2014-05-08 NOTE — Progress Notes (Signed)
ANTICOAGULATION CONSULT NOTE - Follow Up Consult  Pharmacy Consult:  Heparin  Indication: DVT, right internal jugular  Allergies  Allergen Reactions  . Lisinopril Swelling    Lips and tongue swell  . Niacin And Related Other (See Comments)    unknown  . Norvasc [Amlodipine Besylate] Rash    Flushing  . Penicillins Rash   Patient Measurements: Height: 6' (182.9 cm) Weight: 163 lb (73.936 kg) IBW/kg (Calculated) : 77.6  Vital Signs: Temp: 97.5 F (36.4 C) (12/13 1026) Temp Source: Oral (12/13 1026) BP: 129/55 mmHg (12/13 1026) Pulse Rate: 84 (12/13 1026)  Labs:  Recent Labs  05/05/14 1726 05/06/14 1205 05/06/14 1830 05/07/14 0442 05/07/14 1243 05/08/14 0500  HGB 9.1*  --  8.8* 8.4*  --  7.6*  HCT 29.8*  --  28.0* 26.9*  --  24.5*  PLT 121*  --  110* 99*  --  96*  APTT  --   --  48*  --   --   --   LABPROT  --   --  14.7  --   --   --   INR  --   --  1.13  --   --   --   HEPARINUNFRC  --   --   --  0.47 0.58 0.60  CREATININE 4.92* 6.60*  --   --   --   --       Assessment: 68 y/o male with ESRD on a heparin drip for right IJ vein DVT.  Heparin level is therapeutic.  No bleeding reported but patient's hemoglobin and platelet count are drifting down.     Goal of Therapy:  Heparin level 0.3-0.7 units/ml Monitor platelets by anticoagulation protocol: Yes    Plan:  - Decrease heparin gtt slightly to 1050 units/hr - Daily CBC / HL - F/U plan for long term anticoagulant - Continue vanc 750mg  IV q-HD MWF and Cefepime 2gm IV q-HD MWF   Eden Toohey D. Mina Marble, PharmD, BCPS Pager:  [phone] 05/08/2014, 2:35 PM

## 2014-05-08 NOTE — Progress Notes (Signed)
Patient ID: Brandon Sharp, male   DOB: 09-23-1945, 68 y.o.   MRN: [id number]         [place] [facility]    Date of Admission:  05/05/2014           Day 3 vancomycin         Day 3 cefepime  Principal Problem:   Fever of unknown origin (FUO) Active Problems:   Heart transplanted   DM (diabetes mellitus), type 2 with renal complications   ESRD on hemodialysis   Healthcare associated bacterial pneumonia   FUO (fever of unknown origin)   Protein-calorie malnutrition, severe   . aspirin EC  81 mg Oral Daily  . atorvastatin  40 mg Oral Daily  . azaTHIOprine  75 mg Oral q morning - 10a  . calcium acetate  667 mg Oral TID WC  . carvedilol  6.25 mg Oral QHS  . ceFEPIme (MAXIPIME) 2 GM IVP  2 g Intravenous Q M,W,F-2000  . colesevelam  1,875 mg Oral BID WC  . [START ON 05/09/2014] darbepoetin (ARANESP) injection - DIALYSIS  200 mcg Intravenous Q Mon-HD  . doxercalciferol  1 mcg Intravenous Q M,W,F-HD  . famotidine  10 mg Oral QHS  . insulin aspart  0-9 Units Subcutaneous TID WC  . isosorbide dinitrate  40 mg Oral TID WC  . multivitamin  1 tablet Oral QHS  . predniSONE  10 mg Oral Q breakfast  . sirolimus  2 mg Oral Daily  . [START ON 05/09/2014] vancomycin (VANCOCIN) 750 mg IVPB (Hemodialysis)  750 mg Intravenous Q M,W,F-HD    Subjective: He is feeling much better.  Review of Systems: Pertinent items are noted in HPI.  Past Medical History  Diagnosis Date  . Diabetes mellitus   . Hypertension   . Myocardial infarction 1985; 1990  . Angina   . Coronary artery disease   . Dysrhythmia   . DVT (deep venous thrombosis) ~ 12/2010    LLE  . Anemia   . Blood transfusion 06/1999    post heart transplant  . Peripheral vascular disease   . CHF (congestive heart failure) 2001    beffore transplant  . Renal failure     Hemodialysis MWF last 2 years, sse dr [name] nephrology, goes to [facility]  . Renal insufficiency     History    Substance Use Topics  . Smoking status: Former Smoker -- 1.00 packs/day for 20 years    Types: Cigarettes    Quit date: 05/27/1988  . Smokeless tobacco: Never Used  . Alcohol Use: Yes     Comment: "Holidays"    Family History  Problem Relation Age of Onset  . Heart disease Mother   . Hyperlipidemia Mother   . Hypertension Mother   . Hyperlipidemia Father   . Hypertension Father   . Diabetes Brother   . Heart disease Brother     Heart Disease before age 87  . Hyperlipidemia Brother   . Hypertension Brother    Allergies  Allergen Reactions  . Lisinopril Swelling    Lips and tongue swell  . Niacin And Related Other (See Comments)    unknown  . Norvasc [Amlodipine Besylate] Rash    Flushing  . Penicillins Rash    OBJECTIVE: Blood pressure 129/55, pulse 84, temperature 97.5 F (36.4 C), temperature source Oral, resp. rate 17, height 6' (1.829 m), weight 163 lb (73.936 kg), SpO2 96 %. General: He is up in a chair  watching television  Skin: No rash  Lungs: Clear  Cor: Regular S1 and S2 with early 1/6 systolic murmur  Abdomen: Soft and nontender His arm is swollen   Lab Results Lab Results  Component Value Date   WBC 2.8* 05/08/2014   HGB 7.6* 05/08/2014   HCT 24.5* 05/08/2014   MCV 91.4 05/08/2014   PLT 96* 05/08/2014    Lab Results  Component Value Date   CREATININE 6.60* 05/06/2014   BUN 24* 05/06/2014   NA 135* 05/06/2014   K 3.9 05/06/2014   CL 94* 05/06/2014   CO2 26 05/06/2014    Lab Results  Component Value Date   ALT 9 05/05/2014   AST 28 05/05/2014   ALKPHOS 56 05/05/2014   BILITOT 0.4 05/05/2014     Microbiology: Recent Results (from the past 240 hour(s))  Culture, blood (routine x 2)     Status: None (Preliminary result)   Collection Time: 05/05/14  8:44 PM  Result Value Ref Range Status   Specimen Description BLOOD ARM LEFT  Final   Special Requests BOTTLES DRAWN AEROBIC AND ANAEROBIC 10CC  Final   Culture  Setup Time   Final     05/06/2014 00:38 Performed at [facility]    Culture   Final           BLOOD CULTURE RECEIVED NO GROWTH TO DATE CULTURE WILL BE HELD FOR 5 DAYS BEFORE ISSUING A FINAL NEGATIVE REPORT Performed at [facility]    Report Status PENDING  Incomplete  Culture, blood (routine x 2)     Status: None (Preliminary result)   Collection Time: 05/05/14  8:51 PM  Result Value Ref Range Status   Specimen Description BLOOD WRIST LEFT  Final   Special Requests BOTTLES DRAWN AEROBIC AND ANAEROBIC 5CC  Final   Culture  Setup Time   Final    05/06/2014 00:39 Performed at [facility]    Culture   Final           BLOOD CULTURE RECEIVED NO GROWTH TO DATE CULTURE WILL BE HELD FOR 5 DAYS BEFORE ISSUING A FINAL NEGATIVE REPORT Performed at [facility]    Report Status PENDING  Incomplete  MRSA PCR Screening     Status: None   Collection Time: 05/06/14 12:19 AM  Result Value Ref Range Status   MRSA by PCR NEGATIVE NEGATIVE Final    Comment:        The GeneXpert MRSA Assay (FDA approved for NASAL specimens only), is one component of a comprehensive MRSA colonization surveillance program. It is not intended to diagnose MRSA infection nor to guide or monitor treatment for MRSA infections.   Culture, blood (routine x 2)     Status: None (Preliminary result)   Collection Time: 05/06/14  4:50 PM  Result Value Ref Range Status   Specimen Description BLOOD LEFT ARM  Final   Special Requests BOTTLES DRAWN AEROBIC AND ANAEROBIC 10CC  Final   Culture  Setup Time   Final    05/06/2014 22:41 Performed at [facility]    Culture   Final           BLOOD CULTURE RECEIVED NO GROWTH TO DATE CULTURE WILL BE HELD FOR 5 DAYS BEFORE ISSUING A FINAL NEGATIVE REPORT Performed at [facility]    Report Status PENDING  Incomplete    Assessment: He has defervesced again on empiric antibiotics. I suspect that he may have septic thrombophlebitis causing his  recurrent  fevers and right arm swelling.  Plan: Continue vancomycin and cefepime    Michel Bickers, MD [facility] for [place] [phone] pager   [phone] cell 05/08/2014, 2:03 PM

## 2014-05-08 NOTE — Progress Notes (Signed)
Subjective:   Felling better, wants to go home- needs oral anticoagulant before he can be discharged.   Objective Filed Vitals:   05/07/14 0955 05/07/14 1737 05/07/14 2103 05/08/14 0435  BP: 151/66 112/60 117/54 108/51  Pulse: 101 87 84 80  Temp: 98.2 F (36.8 C) 98.7 F (37.1 C) 98.3 F (36.8 C) 98.2 F (36.8 C)  TempSrc: Oral Oral Oral Oral  Resp: 18 20 17 16   Height:      Weight:   73.936 kg (163 lb)   SpO2: 91% 96% 100% 100%   Physical Exam General: alert and oriented. No acute distress.  Heart: RRR  Lungs: faint bibasilar rhonchi, unlabored.  Abdomen:soft, nontender. +BS Extremities: no LE edema Dialysis Access: R AVF +b/t R arm pitting edema  Dialysis Orders: MWF [facility] 71.5 kgs 2K/2.25ca 4hr 3000 Heparin R AVF 400/1.5  hectorol 1 mcg IV/HD Aranesp 60 q wends Venofer 100 x5- stop 12/11  Assessment/Plan: 1. Recurrent fever- afebrile. extensive workup during previous admissions. Following with Dr. Campbell/Infection disease. Blood culture no growth to date CT- concerning for PNA. LUL pulmonary nodule. No LE DVT. RUA occlusive DVT. Vanc and cefepime started 2. RUA DVT- heparin gtt- needs oral anticoagulation. Also for fistulogram Monday w [facility] 3. ESRD - MWF @ [facility]. HD monday 4. Hypertension/volume - 108/51. Getting to [facility] outpt. Cont coreg 5. Anemia - hgb 7.6 change ESA to Monday with increased dose (273mcg) given drop in hgb- and last dose of Fe friday.- watch CBC/bleeding on hepatin gtt 6. 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 Cont hectorol and phoslo. Last pth 10/28 215 Nutrition - alb 2 renal/carb diet  6. Heart transplant- on immunosuppressive therapy 7. DM per primary  Shelle Iron, NP [place] [phone] 05/08/2014,10:17 AM  LOS: 3 days   Renal Attending: He has some dyspnea still and apperas SOB.  CXR has some infiltrates bilat and exam rales, weights around EDW so maybe need to lower.  Will plan to remove more fluid in AM  to optimize volume/pulmonary status. Hgb falling. Lynann Demetrius C   Additional Objective Labs: Basic Metabolic Panel:  Recent Labs Lab 05/05/14 1726 05/06/14 1205  NA 137 135*  K 2.9* 3.9  CL 93* 94*  CO2 30 26  GLUCOSE 143* 169*  BUN 17 24*  CREATININE 4.92* 6.60*  CALCIUM 8.6 8.5  PHOS  --  2.3   Liver Function Tests:  Recent Labs Lab 05/05/14 1726 05/06/14 1205  AST 28  --   ALT 9  --   ALKPHOS 56  --   BILITOT 0.4  --   PROT 5.9*  --   ALBUMIN 2.3* 2.0*   No results for input(s): LIPASE, AMYLASE in the last 168 hours. CBC:  Recent Labs Lab 05/05/14 1726 05/06/14 1830 05/07/14 0442 05/08/14 0500  WBC 4.5 4.8 4.0 2.8*  NEUTROABS 2.6  --   --   --   HGB 9.1* 8.8* 8.4* 7.6*  HCT 29.8* 28.0* 26.9* 24.5*  MCV 92.5 93.6 92.4 91.4  PLT 121* 110* 99* 96*   Blood Culture    Component Value Date/Time   SDES BLOOD LEFT ARM 05/06/2014 1650   SPECREQUEST BOTTLES DRAWN AEROBIC AND ANAEROBIC 10CC 05/06/2014 1650   CULT  05/06/2014 1650           BLOOD CULTURE RECEIVED NO GROWTH TO DATE CULTURE WILL BE HELD FOR 5 DAYS BEFORE ISSUING A FINAL NEGATIVE REPORT Performed at [facility] PENDING 05/06/2014 1650  Cardiac Enzymes: No results for input(s): CKTOTAL, CKMB, CKMBINDEX, TROPONINI in the last 168 hours. CBG:  Recent Labs Lab 05/07/14 0749 05/07/14 1117 05/07/14 1621 05/07/14 2103 05/08/14 0736  GLUCAP 106* 123* 187* 177* 117*   Iron Studies:  Recent Labs  05/06/14  FERRITIN 2490*   @[email or web address] @ Studies/Results: No results found. Medications: . heparin Stopped (05/08/14 0440)   . aspirin EC  81 mg Oral Daily  . atorvastatin  40 mg Oral Daily  . azaTHIOprine  75 mg Oral q morning - 10a  . calcium acetate  667 mg Oral TID WC  . carvedilol  6.25 mg Oral QHS  . ceFEPIme (MAXIPIME) 2 GM IVP  2 g Intravenous Q M,W,F-2000  . colesevelam  1,875 mg Oral BID WC  . [START ON 05/11/2014] darbepoetin (ARANESP) injection -  DIALYSIS  60 mcg Intravenous Q Wed-HD  . doxercalciferol  1 mcg Intravenous Q M,W,F-HD  . famotidine  10 mg Oral QHS  . insulin aspart  0-9 Units Subcutaneous TID WC  . isosorbide dinitrate  40 mg Oral TID WC  . multivitamin  1 tablet Oral QHS  . predniSONE  10 mg Oral Q breakfast  . sirolimus  2 mg Oral Daily  . [START ON 05/09/2014] vancomycin (VANCOCIN) 750 mg IVPB (Hemodialysis)  750 mg Intravenous Q M,W,F-HD

## 2014-05-08 NOTE — Progress Notes (Signed)
PATIENT DETAILS Name: Brandon Sharp Age: 68 y.o. Sex: male Date of Birth: 1946-01-29 Admit Date: 05/05/2014 Admitting Physician Orvan Falconer, MD WUJ:[id number],NWGNFAO C, MD  Subjective: Denies any major complaints this morning.  Assessment/Plan: Principal Problem:   Fever of unknown origin (FUO):ongoing for 4-6 weeks, so far extensive workup including blood cultures, urine cultures, bronchial with negative AFB and fungal smears so far, question multifocal pneumonia on CT scan. Further workup has demonstrated a right IJ DVT and also has significant swelling of his right arm. Fevers either from ongoing pneumonia or septic thrombophlebitis. Infectious disease following, remains on empiric antibiotics as outlined below. He remains on IV heparin for right IJ DVT.  Active Problems:  Right IJ DVT: Currently on IV heparin, given swelling of his right upper extremity-seen by vascular surgery for a fistulogram of his right upper extremity tomorrow.  Heart transplant, failed kidney transplant: Continue home immunosuppressants. Follow with transplant clinic post discharge.   ESRD: On Monday Wednesday Friday schedule. Renal consulted.   DM (diabetes mellitus), type 2 with renal complications: CBGs stable, continue with SSI.   Hypertension: Continue with Coreg, Imdur   Dyslipidemia: Continue with statin   Protein-calorie malnutrition, severe: Continue with supplements.  Disposition: Remain inpatient  Antibiotics:  See below   Anti-infectives    Start     Dose/Rate Route Frequency Ordered Stop   05/09/14 1200  vancomycin (VANCOCIN) 750 mg in sodium chloride 0.9 % 150 mL IVPB     750 mg150 mL/hr over 60 Minutes Intravenous Every M-W-F (Hemodialysis) 05/06/14 1639     05/06/14 2000  ceFEPIme (MAXIPIME) 2 g in dextrose 5 % 50 mL IVPB     2 g100 mL/hr over 30 Minutes Intravenous Every M-W-F (2000) 05/06/14 1641     05/06/14 1645  vancomycin (VANCOCIN) 1,250 mg in sodium chloride 0.9 %  250 mL IVPB     1,250 mg166.7 mL/hr over 90 Minutes Intravenous  Once 05/06/14 1639 05/06/14 1901      DVT Prophylaxis: IV Heparin  Code Status: Full code   Family Communication None at bedside  Procedures:  None  CONSULTS:  ID, nephrology and vascular surgery  Time spent 40 minutes-which includes 50% of the time with face-to-face with patient/ family and coordinating care related to the above assessment and plan.  MEDICATIONS: Scheduled Meds: . aspirin EC  81 mg Oral Daily  . atorvastatin  40 mg Oral Daily  . azaTHIOprine  75 mg Oral q morning - 10a  . calcium acetate  667 mg Oral TID WC  . carvedilol  6.25 mg Oral QHS  . ceFEPIme (MAXIPIME) 2 GM IVP  2 g Intravenous Q M,W,F-2000  . colesevelam  1,875 mg Oral BID WC  . [START ON 05/09/2014] darbepoetin (ARANESP) injection - DIALYSIS  200 mcg Intravenous Q Mon-HD  . doxercalciferol  1 mcg Intravenous Q M,W,F-HD  . famotidine  10 mg Oral QHS  . insulin aspart  0-9 Units Subcutaneous TID WC  . isosorbide dinitrate  40 mg Oral TID WC  . multivitamin  1 tablet Oral QHS  . predniSONE  10 mg Oral Q breakfast  . sirolimus  2 mg Oral Daily  . [START ON 05/09/2014] vancomycin (VANCOCIN) 750 mg IVPB (Hemodialysis)  750 mg Intravenous Q M,W,F-HD   Continuous Infusions: . heparin 1,050 Units/hr (05/08/14 1450)   PRN Meds:.acetaminophen **OR** acetaminophen    PHYSICAL EXAM: Vital signs in last 24 hours: Filed Vitals:   05/07/14 2103  05/08/14 0435 05/08/14 1026 05/08/14 1110  BP: 117/54 108/51 129/55   Pulse: 84 80 84   Temp: 98.3 F (36.8 C) 98.2 F (36.8 C) 97.5 F (36.4 C)   TempSrc: Oral Oral Oral   Resp: 17 16 17    Height:      Weight: 73.936 kg (163 lb)     SpO2: 100% 100% 87% 96%    Weight change: 1.236 kg (2 lb 11.6 oz) Filed Weights   05/06/14 1622 05/06/14 2113 05/07/14 2103  Weight: 71.5 kg (157 lb 10.1 oz) 71.487 kg (157 lb 9.6 oz) 73.936 kg (163 lb)   Body mass index is 22.1 kg/(m^2).   Gen  Exam: Awake and alert with clear speech.   Neck: Supple, No JVD.   Chest: B/L Clear.   CVS: S1 S2 Regular, no murmurs.  Abdomen: soft, BS +, non tender, non distended.  Extremities: no edema, lower extremities warm to touch.RUE swelling Neurologic: Non Focal.   Skin: No Rash.   Wounds: N/A.    Intake/Output from previous day:  Intake/Output Summary (Last 24 hours) at 05/08/14 1609 Last data filed at 05/08/14 0900  Gross per 24 hour  Intake 576.33 ml  Output      0 ml  Net 576.33 ml     LAB RESULTS: CBC  Recent Labs Lab 05/05/14 1726 05/06/14 1830 05/07/14 0442 05/08/14 0500  WBC 4.5 4.8 4.0 2.8*  HGB 9.1* 8.8* 8.4* 7.6*  HCT 29.8* 28.0* 26.9* 24.5*  PLT 121* 110* 99* 96*  MCV 92.5 93.6 92.4 91.4  MCH 28.3 29.4 28.9 28.4  MCHC 30.5 31.4 31.2 31.0  RDW 16.5* 16.6* 16.6* 16.5*  LYMPHSABS 1.4  --   --   --   MONOABS 0.3  --   --   --   EOSABS 0.1  --   --   --   BASOSABS 0.0  --   --   --     Chemistries   Recent Labs Lab 05/05/14 1726 05/06/14 1205  NA 137 135*  K 2.9* 3.9  CL 93* 94*  CO2 30 26  GLUCOSE 143* 169*  BUN 17 24*  CREATININE 4.92* 6.60*  CALCIUM 8.6 8.5    CBG:  Recent Labs Lab 05/07/14 1117 05/07/14 1621 05/07/14 2103 05/08/14 0736 05/08/14 1152  GLUCAP 123* 187* 177* 117* 120*    GFR Estimated Creatinine Clearance: 11 mL/min (by C-G formula based on Cr of 6.6).  Coagulation profile  Recent Labs Lab 05/06/14 1830  INR 1.13    Cardiac Enzymes No results for input(s): CKMB, TROPONINI, MYOGLOBIN in the last 168 hours.  Invalid input(s): CK  Invalid input(s): POCBNP No results for input(s): DDIMER in the last 72 hours. No results for input(s): HGBA1C in the last 72 hours. No results for input(s): CHOL, HDL, LDLCALC, TRIG, CHOLHDL, LDLDIRECT in the last 72 hours. No results for input(s): TSH, T4TOTAL, T3FREE, THYROIDAB in the last 72 hours.  Invalid input(s): FREET3  Recent Labs  05/06/14  FERRITIN 2490*   No  results for input(s): LIPASE, AMYLASE in the last 72 hours.  Urine Studies No results for input(s): UHGB, CRYS in the last 72 hours.  Invalid input(s): UACOL, UAPR, USPG, UPH, UTP, UGL, UKET, UBIL, UNIT, UROB, ULEU, UEPI, UWBC, URBC, UBAC, CAST, UCOM, BILUA  MICROBIOLOGY: Recent Results (from the past 240 hour(s))  Culture, blood (routine x 2)     Status: None (Preliminary result)   Collection Time: 05/05/14  8:44 PM  Result Value  Ref Range Status   Specimen Description BLOOD ARM LEFT  Final   Special Requests BOTTLES DRAWN AEROBIC AND ANAEROBIC 10CC  Final   Culture  Setup Time   Final    05/06/2014 00:38 Performed at [facility]    Culture   Final           BLOOD CULTURE RECEIVED NO GROWTH TO DATE CULTURE WILL BE HELD FOR 5 DAYS BEFORE ISSUING A FINAL NEGATIVE REPORT Performed at [facility]    Report Status PENDING  Incomplete  Culture, blood (routine x 2)     Status: None (Preliminary result)   Collection Time: 05/05/14  8:51 PM  Result Value Ref Range Status   Specimen Description BLOOD WRIST LEFT  Final   Special Requests BOTTLES DRAWN AEROBIC AND ANAEROBIC 5CC  Final   Culture  Setup Time   Final    05/06/2014 00:39 Performed at [facility]    Culture   Final           BLOOD CULTURE RECEIVED NO GROWTH TO DATE CULTURE WILL BE HELD FOR 5 DAYS BEFORE ISSUING A FINAL NEGATIVE REPORT Performed at [facility]    Report Status PENDING  Incomplete  MRSA PCR Screening     Status: None   Collection Time: 05/06/14 12:19 AM  Result Value Ref Range Status   MRSA by PCR NEGATIVE NEGATIVE Final    Comment:        The GeneXpert MRSA Assay (FDA approved for NASAL specimens only), is one component of a comprehensive MRSA colonization surveillance program. It is not intended to diagnose MRSA infection nor to guide or monitor treatment for MRSA infections.   Culture, blood (routine x 2)     Status: None (Preliminary result)   Collection  Time: 05/06/14  4:50 PM  Result Value Ref Range Status   Specimen Description BLOOD LEFT ARM  Final   Special Requests BOTTLES DRAWN AEROBIC AND ANAEROBIC 10CC  Final   Culture  Setup Time   Final    05/06/2014 22:41 Performed at [facility]    Culture   Final           BLOOD CULTURE RECEIVED NO GROWTH TO DATE CULTURE WILL BE HELD FOR 5 DAYS BEFORE ISSUING A FINAL NEGATIVE REPORT Performed at [facility]    Report Status PENDING  Incomplete    RADIOLOGY STUDIES/RESULTS: Dg Chest 2 View  05/05/2014   CLINICAL DATA:  Fever of 102. Vomiting started today. Cough for 3 months. History of heart transplant in 2001. Diabetes, hypertension, CHF. Dialysis. CABG x3.  EXAM: CHEST  2 VIEW  COMPARISON:  04/14/2014  FINDINGS: Status post median sternotomy. Heart is enlarged. Stents are identified in the upper chest. There has been some improvement in aeration of the lung bases. Patchy densities persist however. No definite pleural effusions. No pulmonary edema.  IMPRESSION: Cardiomegaly. Persistent bilateral airspace filling opacities with some improvement in aeration since prior studies.   Electronically Signed   By: Shon Hale M.D.   On: 05/05/2014 21:00   Ct Chest Wo Contrast  04/13/2014   CLINICAL DATA:  68 year old immunocompromised male with intermittent fever. Initial encounter. Current history of renal failure status post renal transplant and transplant project.  EXAM: CT CHEST WITHOUT CONTRAST  TECHNIQUE: Multidetector CT imaging of the chest was performed following the standard protocol without IV contrast.  COMPARISON:  Chest radiograph 04/11/2014 and earlier. Chest CT 04/07/2014  FINDINGS: Improved lung volumes.  Decreased dependent atelectasis in both lungs, however, confluent peribronchovascular opacity persists in both lower lobes in the lingula, with central air bronchograms. See series 3. The right middle lobe is spared. The right upper lobe is spared. The superior aspect of  the left upper lobe is spared.  Only trace associated left pleural fluid. No pericardial effusion. Stable cardiomegaly. No hilar or mediastinal lymphadenopathy. No axillary lymphadenopathy.  Gynecomastia. Stable visualized upper abdominal viscera. Aortic ectasia and calcified atherosclerosis re- identified. Coronary artery stents and atherosclerosis. Sequelae of median sternotomy. Right subclavian venous vascular stent re- identified.  No acute osseous abnormality identified.  IMPRESSION: 1. Improved lung volumes with resolved dependent atelectasis, but persistent confluent bilateral lower lobe and lingula peribronchovascular consolidation. Favor bronchopneumonia in this setting. Noninfectious considerations such as post transplant lymphoproliferative disorder are felt less likely. 2. Trace left pleural fluid. 3. Otherwise stable chest and upper abdominal viscera compared to 04/07/2014.   Electronically Signed   By: Lars Pinks M.D.   On: 04/13/2014 06:00   Ct Chest W Contrast  05/06/2014   CLINICAL DATA:  68 year old male with fever of unknown origin  EXAM: CT CHEST, ABDOMEN, AND PELVIS WITH CONTRAST  TECHNIQUE: Multidetector CT imaging of the chest, abdomen and pelvis was performed following the standard protocol during bolus administration of intravenous contrast.  CONTRAST:  34mL OMNIPAQUE IOHEXOL 300 MG/ML  SOLN  COMPARISON:  04/13/2014  FINDINGS: CT CHEST FINDINGS  Heart/mediastinum: Median sternotomy wires are present. The heart is mildly enlarged. Scattered atherosclerotic aortic and coronary artery calcifications are present. There is no pericardial effusion. There is mild aneurysmal dilatation of the ascending aorta measured 3.9 cm. There is no aortic dissection. Several collateral vessels are noted within the soft tissues of the left lateral chest. An unchanged 7 mm left upper lobe pulmonary nodule is present (series 2, image 32).  There are no pathologically axillary lymph nodes.  Chest: Upper lobe  predominant emphysematous changes are present. Similar appearing bibasilar and linear patchy areas of airspace consolidation is noted. Interstitial thickening is also present. Small bilateral pleural effusions are present. There is no pneumothorax upper lobe predominant centrilobular and paraseptal emphysematous changes are present. No suspicious pulmonary nodules or masses are identified. A few scattered calcified pulmonary nodules are noted.  Gynecomastia is noted.  CT ABDOMEN AND PELVIS FINDINGS  Liver: A 2.2 cm right and left hepatic lobe cysts are noted. Additional subcentimeter hypodense renal lesions are too small to fully characterize.  Gallbladder: There is no cholelithiasis or biliary sludge. There is no pericholecystic fluid. There is no abnormal gallbladder wall thickening. There is no intrahepatic or extrahepatic biliary ductal dilatation.  Spleen: Unremarkable.  Pancreas: Unremarkable.  Adrenal glands: Unremarkable.  Kidneys: Multiple hypodense lesions of varying sizes are seen noted throughout the kidneys, compatible with cysts. Renal cortical thinning is also present bilaterally. There is no hydronephrosis. Several tiny nonobstructing calcific densities are noted bilaterally. A right lower quadrant renal transplant is present.  Bowel/gastrointestinal tract: There is colonic diverticulosis without definite evidence for acute diverticulitis. There is no abnormal bowel wall thickening. There is no evidence of bowel obstruction  Pelvis: There is no pelvic mass. The urinary bladder is decompressed. The prostate gland is mildly enlarged and measures approximately 5.1 x 2.5 cm.  Miscellaneous: Extensive vascular calcifications are present. There is mild ectasia of the abdominal aorta.  Osseous structures: Multilevel degenerative changes of the spine are present. Degenerative changes of the hips are also noted. No acute osseous abnormality is identified.  IMPRESSION: 1.  Findings remain concerning for  multifocal pneumonia. 2. Colonic diverticulosis without definite evidence for acute diverticulitis. 3. Chronic medical renal disease. 4. Prostatic enlargement. 5. Aneurysmal dilatation of the ascending aorta. 6. Cardiomegaly. 7. 7 mm left upper lobe pulmonary nodule. If the patient is at high risk for bronchogenic carcinoma, follow-up chest CT at 3-76months is recommended. If the patient is at low risk for bronchogenic carcinoma, follow-up chest CT at 6-12 months is recommended. This recommendation follows the consensus statement: Guidelines for Management of Small Pulmonary Nodules Detected on CT Scans: A Statement from the [place] as published in Radiology 2005; 237:395-400. 8. Probable superimposed mild interstitial edema and trace bilateral pleural effusions.   Electronically Signed   By: Rosemarie Ax   On: 05/06/2014 08:39   Mr Brain Wo Contrast  04/14/2014   CLINICAL DATA:  68 year old male with immunosuppression following organ transplantation presents with symptoms of fever, malaise, and encephalopathy. Initial encounter.  EXAM: MRI HEAD WITHOUT CONTRAST  TECHNIQUE: Multiplanar, multiecho pulse sequences of the brain and surrounding structures were obtained without intravenous contrast.  COMPARISON:  CT head 04/06/2014.  MR head 05/15/2003.  FINDINGS: There is a 1 cm sized area of diffusion restriction in the RIGHT occipital subcortical white matter with slight associated T2 and FLAIR prolongation. No other similar lesions. No acute hemorrhage is present, although is small focus chronic hemorrhage was noted in 2004 in the RIGHT posterior temporal subcortical white matter, with additional tiny foci of chronic hemorrhage in the RIGHT temporal and LEFT frontal cortex.  Generalized atrophy. Multiple BILATERAL chronic cerebellar infarcts. BILATERAL deep nuclei chronic lacunar infarcts. Minor changes of subcortical and periventricular white matter signal abnormality, likely chronic microvascular  ischemic change.  Normal pituitary and cerebellar tonsils. Advanced cervical spondylosis with suspected slight cord compression.  Flow voids are maintained in the carotid, basilar, and vertebral arteries.  Abnormalities of the extracranial soft tissues include significant fluid accumulation with probable retention cyst formation the LEFT maxillary sinus causing expansion, and BILATERAL T2 hyperintense fluid in the mastoid air cells, favored to represent benign effusions. No sinus air-fluid level. Negative orbits. Extracranial soft tissues unremarkable.  No contrast was given in this patient renal failure, but there are no obvious features of pachymeningeal thickening or sulcal FLAIR hyperintensity to suggest meningitis.  IMPRESSION: Nonspecific 1 cm focus of restricted diffusion in the RIGHT occipital subcortical white matter. The patient has evidence for multiple remote areas of infarction and certainly this is a leading differential consideration.  In the setting of immunosuppression, with the patient on Imuran, neurotoxicity secondary to this drug is associated with abnormal diffusion signal in the posterior circulation, including occipital cortex and white matter. Similar findings were seen on prior MR from 2004, but were more extensive at that time.  Cerebritis or brain abscess, well also associated with restricted diffusion, is not favored based on lesion morphology and lack of surrounding vasogenic edema. Intracranial infection is not favored as a cause of the patient's fever. In addition, LEFT maxillary sinus disease and BILATERAL mastoid effusions are likely non-worrisome.   Electronically Signed   By: Rolla Flatten M.D.   On: 04/14/2014 15:44   Ct Abdomen Pelvis W Contrast  05/06/2014   CLINICAL DATA:  68 year old male with fever of unknown origin  EXAM: CT CHEST, ABDOMEN, AND PELVIS WITH CONTRAST  TECHNIQUE: Multidetector CT imaging of the chest, abdomen and pelvis was performed following the standard  protocol during bolus administration of intravenous contrast.  CONTRAST:  22mL OMNIPAQUE IOHEXOL 300 MG/ML  SOLN  COMPARISON:  04/13/2014  FINDINGS: CT CHEST FINDINGS  Heart/mediastinum: Median sternotomy wires are present. The heart is mildly enlarged. Scattered atherosclerotic aortic and coronary artery calcifications are present. There is no pericardial effusion. There is mild aneurysmal dilatation of the ascending aorta measured 3.9 cm. There is no aortic dissection. Several collateral vessels are noted within the soft tissues of the left lateral chest. An unchanged 7 mm left upper lobe pulmonary nodule is present (series 2, image 32).  There are no pathologically axillary lymph nodes.  Chest: Upper lobe predominant emphysematous changes are present. Similar appearing bibasilar and linear patchy areas of airspace consolidation is noted. Interstitial thickening is also present. Small bilateral pleural effusions are present. There is no pneumothorax upper lobe predominant centrilobular and paraseptal emphysematous changes are present. No suspicious pulmonary nodules or masses are identified. A few scattered calcified pulmonary nodules are noted.  Gynecomastia is noted.  CT ABDOMEN AND PELVIS FINDINGS  Liver: A 2.2 cm right and left hepatic lobe cysts are noted. Additional subcentimeter hypodense renal lesions are too small to fully characterize.  Gallbladder: There is no cholelithiasis or biliary sludge. There is no pericholecystic fluid. There is no abnormal gallbladder wall thickening. There is no intrahepatic or extrahepatic biliary ductal dilatation.  Spleen: Unremarkable.  Pancreas: Unremarkable.  Adrenal glands: Unremarkable.  Kidneys: Multiple hypodense lesions of varying sizes are seen noted throughout the kidneys, compatible with cysts. Renal cortical thinning is also present bilaterally. There is no hydronephrosis. Several tiny nonobstructing calcific densities are noted bilaterally. A right lower  quadrant renal transplant is present.  Bowel/gastrointestinal tract: There is colonic diverticulosis without definite evidence for acute diverticulitis. There is no abnormal bowel wall thickening. There is no evidence of bowel obstruction  Pelvis: There is no pelvic mass. The urinary bladder is decompressed. The prostate gland is mildly enlarged and measures approximately 5.1 x 2.5 cm.  Miscellaneous: Extensive vascular calcifications are present. There is mild ectasia of the abdominal aorta.  Osseous structures: Multilevel degenerative changes of the spine are present. Degenerative changes of the hips are also noted. No acute osseous abnormality is identified.  IMPRESSION: 1. Findings remain concerning for multifocal pneumonia. 2. Colonic diverticulosis without definite evidence for acute diverticulitis. 3. Chronic medical renal disease. 4. Prostatic enlargement. 5. Aneurysmal dilatation of the ascending aorta. 6. Cardiomegaly. 7. 7 mm left upper lobe pulmonary nodule. If the patient is at high risk for bronchogenic carcinoma, follow-up chest CT at 3-52months is recommended. If the patient is at low risk for bronchogenic carcinoma, follow-up chest CT at 6-12 months is recommended. This recommendation follows the consensus statement: Guidelines for Management of Small Pulmonary Nodules Detected on CT Scans: A Statement from the [place] as published in Radiology 2005; 237:395-400. 8. Probable superimposed mild interstitial edema and trace bilateral pleural effusions.   Electronically Signed   By: Rosemarie Ax   On: 05/06/2014 08:39   Dg Chest Port 1 View  04/14/2014   CLINICAL DATA:  Status post left bronchoscopy lung biopsy.  EXAM: PORTABLE CHEST - 1 VIEW  COMPARISON:  04/11/2014  FINDINGS: Persistent bibasilar pulmonary airspace opacity again seen, left side greater than right. No evidence of pneumothorax. Cardiomegaly is stable. Bilateral subclavian stents again seen in place. Patient has  undergone previous median sternotomy.  IMPRESSION: No significant change in bibasilar airspace disease and cardiomegaly. No pneumothorax identified.   Electronically Signed   By: Earle Gell M.D.   On: 04/14/2014 12:32   Dg Chest [facility]  04/11/2014   CLINICAL DATA:  Fever and chills.  EXAM: PORTABLE CHEST - 1 VIEW  COMPARISON:  Chest CT, 04/07/2014.  Chest radiographs, 04/06/2014.  FINDINGS: Patchy areas of left mid and lower lung consolidation and milder right lung base consolidation, this similar to the prior studies allowing for differences in technique and inspiratory volumes. No new areas of lung consolidation. No convincing pulmonary edema. No pleural effusion or pneumothorax.  Changes from cardiac surgery are stable. Cardiac silhouette is mildly enlarged. No mediastinal or hilar masses. Stable right subclavian vein stent.  IMPRESSION: Persist consolidation in the left mid and lower lung and in the right lung base similar to the prior studies. This is most suggestive of multifocal pneumonia. No new abnormalities.   Electronically Signed   By: Lajean Manes M.D.   On: 04/11/2014 20:46   Dg C-arm Bronchoscopy  04/14/2014   CLINICAL DATA:    C-ARM BRONCHOSCOPY  Fluoroscopy was utilized by the requesting physician.  No radiographic  interpretation.     Oren Binet, MD  [facility] Pager:[phone]  If 7PM-7AM, please contact night-coverage [email or web address] Password [email or web address] 05/08/2014, 4:09 PM   LOS: 3 days

## 2014-05-09 ENCOUNTER — Encounter (HOSPITAL_COMMUNITY)
Admission: EM | Disposition: A | Discharge status: Home / Self Care | Payer: Self-pay | Source: Home / Self Care | Attending: Internal Medicine

## 2014-05-09 ENCOUNTER — Encounter (HOSPITAL_COMMUNITY): Payer: Self-pay | Admitting: Nephrology

## 2014-05-09 DIAGNOSIS — I871 Compression of vein: Secondary | ICD-10-CM

## 2014-05-09 DIAGNOSIS — J189 Pneumonia, unspecified organism: Secondary | ICD-10-CM

## 2014-05-09 DIAGNOSIS — I82C11 Acute embolism and thrombosis of right internal jugular vein: Principal | ICD-10-CM

## 2014-05-09 HISTORY — PX: SHUNTOGRAM: SHX5510

## 2014-05-09 LAB — BASIC METABOLIC PANEL
Anion gap: 16 — ABNORMAL HIGH (ref 5–15)
BUN: 39 mg/dL — ABNORMAL HIGH (ref 6–23)
CO2: 22 mEq/L (ref 19–32)
CREATININE: 6.88 mg/dL — AB (ref 0.50–1.35)
Calcium: 8.6 mg/dL (ref 8.4–10.5)
Chloride: 99 mEq/L (ref 96–112)
GFR calc non Af Amer: 7 mL/min — ABNORMAL LOW (ref >90)
GFR, EST AFRICAN AMERICAN: 8 mL/min — AB (ref >90)
Glucose, Bld: 142 mg/dL — ABNORMAL HIGH (ref 70–99)
Potassium: 4.2 mEq/L (ref 3.7–5.3)
Sodium: 137 mEq/L (ref 137–147)

## 2014-05-09 LAB — CBC
HCT: 24.2 % — ABNORMAL LOW (ref 39.0–52.0)
Hemoglobin: 7.5 g/dL — ABNORMAL LOW (ref 13.0–17.0)
MCH: 28.1 pg (ref 26.0–34.0)
MCHC: 31 g/dL (ref 30.0–36.0)
MCV: 90.6 fL (ref 78.0–100.0)
PLATELETS: 100 10*3/uL — AB (ref 150–400)
RBC: 2.67 MIL/uL — ABNORMAL LOW (ref 4.22–5.81)
RDW: 16.3 % — AB (ref 11.5–15.5)
WBC: 3 10*3/uL — ABNORMAL LOW (ref 4.0–10.5)

## 2014-05-09 LAB — HEPARIN LEVEL (UNFRACTIONATED)
Heparin Unfractionated: 0.78 IU/mL — ABNORMAL HIGH (ref 0.30–0.70)
Heparin Unfractionated: 1.13 IU/mL — ABNORMAL HIGH (ref 0.30–0.70)

## 2014-05-09 LAB — GLUCOSE, CAPILLARY
GLUCOSE-CAPILLARY: 110 mg/dL — AB (ref 70–99)
GLUCOSE-CAPILLARY: 166 mg/dL — AB (ref 70–99)
Glucose-Capillary: 178 mg/dL — ABNORMAL HIGH (ref 70–99)

## 2014-05-09 LAB — RENAL FUNCTION PANEL
ALBUMIN: 2.1 g/dL — AB (ref 3.5–5.2)
Anion gap: 15 (ref 5–15)
BUN: 40 mg/dL — AB (ref 6–23)
CHLORIDE: 98 meq/L (ref 96–112)
CO2: 22 mEq/L (ref 19–32)
CREATININE: 6.93 mg/dL — AB (ref 0.50–1.35)
Calcium: 8.7 mg/dL (ref 8.4–10.5)
GFR calc Af Amer: 8 mL/min — ABNORMAL LOW (ref >90)
GFR calc non Af Amer: 7 mL/min — ABNORMAL LOW (ref >90)
Glucose, Bld: 141 mg/dL — ABNORMAL HIGH (ref 70–99)
Phosphorus: 2.9 mg/dL (ref 2.3–4.6)
Potassium: 4.2 mEq/L (ref 3.7–5.3)
Sodium: 135 mEq/L — ABNORMAL LOW (ref 137–147)

## 2014-05-09 SURGERY — SHUNTOGRAM
Anesthesia: LOCAL | Laterality: Right

## 2014-05-09 MED ORDER — ACETAMINOPHEN 325 MG PO TABS: 650.0000 mg | ORAL_TABLET | ORAL | Status: DC | PRN

## 2014-05-09 MED ORDER — PENTAFLUOROPROP-TETRAFLUOROETH EX AERO
INHALATION_SPRAY | CUTANEOUS | Status: AC
  Filled 2014-05-09: qty 103.5

## 2014-05-09 MED ORDER — SODIUM CHLORIDE 0.9 % IV SOLN: 250.0000 mL | INTRAVENOUS | Status: DC | PRN

## 2014-05-09 MED ORDER — DARBEPOETIN ALFA 200 MCG/0.4ML IJ SOSY
PREFILLED_SYRINGE | INTRAMUSCULAR | Status: AC
  Administered 2014-05-09: 200 ug via INTRAVENOUS
  Filled 2014-05-09: qty 0.4

## 2014-05-09 MED ORDER — HEPARIN SODIUM (PORCINE) 1000 UNIT/ML IJ SOLN
INTRAMUSCULAR | Status: AC
  Filled 2014-05-09: qty 1

## 2014-05-09 MED ORDER — HEPARIN (PORCINE) IN NACL 2-0.9 UNIT/ML-% IJ SOLN
INTRAMUSCULAR | Status: AC
  Filled 2014-05-09: qty 1000

## 2014-05-09 MED ORDER — DOXERCALCIFEROL 4 MCG/2ML IV SOLN
INTRAVENOUS | Status: AC
Start: 2014-05-09 — End: 2014-05-09
  Administered 2014-05-09: 1 ug via INTRAVENOUS
  Filled 2014-05-09: qty 2

## 2014-05-09 MED ORDER — SODIUM CHLORIDE 0.9 % IJ SOLN
3.0000 mL | INTRAMUSCULAR | Status: DC | PRN
Start: 2014-05-09 — End: 2014-05-12

## 2014-05-09 MED ORDER — HEPARIN (PORCINE) IN NACL 100-0.45 UNIT/ML-% IJ SOLN
950.0000 [IU]/h | INTRAMUSCULAR | Status: DC
  Administered 2014-05-09 – 2014-05-10 (×2): 950 [IU]/h via INTRAVENOUS
  Filled 2014-05-09 (×2): qty 250

## 2014-05-09 MED ORDER — SODIUM CHLORIDE 0.9 % IJ SOLN
3.0000 mL | Freq: Two times a day (BID) | INTRAMUSCULAR | Status: DC
  Administered 2014-05-11 – 2014-05-12 (×2): 3 mL via INTRAVENOUS

## 2014-05-09 MED ORDER — ONDANSETRON HCL 4 MG/2ML IJ SOLN: 4.0000 mg | Freq: Four times a day (QID) | INTRAMUSCULAR | Status: DC | PRN

## 2014-05-09 MED ORDER — LIDOCAINE HCL (PF) 1 % IJ SOLN
INTRAMUSCULAR | Status: AC
  Filled 2014-05-09: qty 30

## 2014-05-09 NOTE — Procedures (Signed)
I was present at this dialysis session, have reviewed the session itself and made  appropriate changes  Kelly Splinter MD (pgr) [phone]    ([phone] 05/09/2014, 9:44 AM

## 2014-05-09 NOTE — Interval H&P Note (Signed)
ASA 4.  Mallampati 3.  Adele Barthel, MD [facility] Office: [phone] Pager: [phone]  05/09/2014, 2:35 PM

## 2014-05-09 NOTE — H&P (View-Only) (Signed)
[facility]  Patient name: Brandon Sharp MRN: [id number] DOB: 1945/09/19 Sex: male  REASON FOR CONSULT: Right upper extremity swelling and fever  HPI: Brandon Sharp is a 68 y.o. male who was admitted on 05/05/2014 with a fever. Of note, he is on chronic immunosuppressive therapy as he has undergone a heart transplant in 2001. He has a failed kidney transplant and dialyzes using a right upper arm fistula on Monday Wednesdays and Fridays. He was noted to have swelling in his right upper extremity. A duplex was obtained. He was noted to have acute clot in the right internal jugular vein. The axillary and subclavian veins and the right did not have clot. He states that the right upper arm fistula, which is a brachial cephalic fistula, was placed approximately 3 years ago.  Currently he has no significant pain or paresthesias in the right upper extremity. He has also noted some swelling in the left upper extremity.  Past Medical History  Diagnosis Date  . Diabetes mellitus   . Hypertension   . Myocardial infarction 1985; 1990  . Angina   . Coronary artery disease   . Dysrhythmia   . DVT (deep venous thrombosis) ~ 12/2010    LLE  . Anemia   . Blood transfusion 06/1999    post heart transplant  . Peripheral vascular disease   . CHF (congestive heart failure) 2001    beffore transplant  . Renal failure     Hemodialysis MWF last 2 years, sse dr [name] nephrology, goes to [facility]  . Renal insufficiency    Family History  Problem Relation Age of Onset  . Heart disease Mother   . Hyperlipidemia Mother   . Hypertension Mother   . Hyperlipidemia Father   . Hypertension Father   . Diabetes Brother   . Heart disease Brother     Heart Disease before age 84  . Hyperlipidemia Brother   . Hypertension Brother    SOCIAL HISTORY: History  Substance Use Topics  . Smoking status: Former Smoker -- 1.00 packs/day for 20 years   Types: Cigarettes    Quit date: 05/27/1988  . Smokeless tobacco: Never Used  . Alcohol Use: Yes     Comment: "Holidays"   Allergies  Allergen Reactions  . Lisinopril Swelling    Lips and tongue swell  . Niacin And Related Other (See Comments)    unknown  . Norvasc [Amlodipine Besylate] Rash    Flushing  . Penicillins Rash   REVIEW OF SYSTEMS: Valu.Nieves ] denotes positive finding; [  ] denotes negative finding CARDIOVASCULAR:  [ ]  chest pain   [ ]  chest pressure   [ ]  palpitations   [ ]  orthopnea   [ ]  dyspnea on exertion   [ ]  claudication   [ ]  rest pain   [ ]  DVT   [ ]  phlebitis PULMONARY:   [ ]  productive cough   [ ]  asthma   [ ]  wheezing NEUROLOGIC:   [ ]  weakness  [ ]  paresthesias  [ ]  aphasia  [ ]  amaurosis  [ ]  dizziness HEMATOLOGIC:   [ ]  bleeding problems   [ ]  clotting disorders MUSCULOSKELETAL:  [ ]  joint pain   [ ]  joint swelling Valu.Nieves ] arm swelling GASTROINTESTINAL: [ ]   blood in stool  [ ]   hematemesis GENITOURINARY:  [ ]   dysuria  [ ]   hematuria PSYCHIATRIC:  [ ]  history of major depression INTEGUMENTARY:  [ ]   rashes  [ ]  ulcers CONSTITUTIONAL:  Valu.Nieves ] fever   [ ]  chills  PHYSICAL EXAM: Filed Vitals:   05/06/14 1641 05/06/14 2113 05/07/14 0410 05/07/14 0955  BP: 122/56 124/56 114/57 151/66  Pulse: 82 85 84 101  Temp: 98.4 F (36.9 C) 98.9 F (37.2 C) 99.2 F (37.3 C) 98.2 F (36.8 C)  TempSrc: Oral Oral Oral Oral  Resp: 20 18 16 18   Height:      Weight:  157 lb 9.6 oz (71.487 kg)    SpO2: 96% 99% 100% 91%   Body mass index is 21.37 kg/(m^2). GENERAL: The patient is a well-nourished male, in no acute distress. The vital signs are documented above. CARDIOVASCULAR: There is a regular rate and rhythm. He has moderate right upper extremity swelling. He has radial pulses bilaterally. He has mild left upper extremity swelling. His right upper arm brachiocephalic fistula is pulsatile. PULMONARY: There is good air exchange bilaterally without wheezing or rales. ABDOMEN:  Soft and non-tender with normal pitched bowel sounds.  MUSCULOSKELETAL: There are no major deformities or cyanosis. NEUROLOGIC: No focal weakness or paresthesias are detected. SKIN: There are no ulcers or rashes noted. PSYCHIATRIC: The patient has a normal affect.  DATA:  Lab Results  Component Value Date   WBC 4.0 05/07/2014   HGB 8.4* 05/07/2014   HCT 26.9* 05/07/2014   MCV 92.4 05/07/2014   PLT 99* 05/07/2014   Lab Results  Component Value Date   NA 135* 05/06/2014   K 3.9 05/06/2014   CL 94* 05/06/2014   CO2 26 05/06/2014   Lab Results  Component Value Date   CREATININE 6.60* 05/06/2014   Lab Results  Component Value Date   INR 1.13 05/06/2014   INR 1.01 03/27/2014   INR 1.05 10/01/2012   Lab Results  Component Value Date   HGBA1C 7.2* 04/12/2014   CBG (last 3)   Recent Labs  05/06/14 1640 05/06/14 2116 05/07/14 0749  GLUCAP 99 127* 106*   VENOUS DUPLEX: He has clot which appears to be acute in the right internal jugular vein. There is no clot in the subclavian or axillary vein on the right. He has no lower extremity DVT.  MEDICAL ISSUES:  RIGHT UPPER EXTREMITY SWELLING AND FEVER: He is currently afebrile. His upper arm access appears to be a fistula and I do not think that he has any prosthetic material in the right arm that I'm aware of. Given the swelling however, I have recommended that we proceed with a fistulogram to look for a outflow stenosis area the right upper arm fistula is pulsatile. I will schedule this for early Monday afternoon I have spoken with the dialysis center and they  Will dialyze him first thing Monday morning so that he can undergo the procedure after dialysis.  [place] [facility] [place] Beeper: [phone]

## 2014-05-09 NOTE — Op Note (Signed)
OPERATIVE NOTE   PROCEDURE: 1.  right brachiocephalic arteriovenous fistula cannulation under ultrasound guidance 2.  right arm fistulogram 3.  Venoplasty of right subclavian vein and right innominate vein x 3 (6 mm x 80 mm, 8 mm x 80 mm, 10 mm x 40 mm)  PRE-OPERATIVE DIAGNOSIS: right central venous stenosis  POST-OPERATIVE DIAGNOSIS: same as above   SURGEON: Adele Barthel, MD  ANESTHESIA: local  ESTIMATED BLOOD LOSS: 5 cc  FINDING(S): 1. Patent right brachiocephalic arteriovenous fistula with in-stent stenosis in right subclavian and innominate vein: <30% residual in-stent stenosis 2. ~50% residual stenosis in right subclavian vein at level of clavicle: suspicious for thoracic outlet  SPECIMEN(S):  None  CONTRAST: 50 cc  INDICATIONS: Brandon Sharp is a 68 y.o. male who presents with right arm swelling suspicious for central venous stenosis in the setting of a patent right brachiocephalic arteriovenous fistula.  The patient is scheduled for right arm fistulogram, possible intervention.  The patient is aware the risks include but are not limited to: bleeding, infection, thrombosis of the cannulated access, and possible anaphylactic reaction to the contrast.  The patient is aware of the risks of the procedure and elects to proceed forward.  DESCRIPTION: After full informed written consent was obtained, the patient was brought back to the angiography suite and placed supine upon the angiography table.  The patient was connected to monitoring equipment.  The right arm was prepped and draped in the standard fashion for a percutaneous access intervention.  Under ultrasound guidance, the right brachiocephalic arteriovenous fistula was cannulated with a micropuncture needle.  The microwire was advanced into the fistula and the needle was exchanged for the a microsheath, which was lodged 2 cm into the access.  The wire was removed and the sheath was connected to the IV extension tubing.   Hand injections were completed to image the access from the antecubitum up to the level of axilla.  The central venous structures were also imaged by hand injections.  Based on the images, this patient will need: venoplasty of the stent and right subclavian vein.  A Benson wire was advanced into the axillary vein and the sheath was exchanged for a short 6-Fr sheath.  Based on the the imaging, a 6 mm x 80 mm angioplasty balloon was selected.  The balloon was centered around the stent and inflated to 18 atm for 2 minutes.  The stent was clearly larger than the balloon so, at this point, the balloon was exchanged for a 80 mm x 80 mm angioplasty balloon.  The balloon was centered around the stenosis and inflated to 18 atm for 2 minutes.  On completion imaging, a >30% residual stenosis was present.  At this point, I placed a 10 mm x 40 mm balloon and centered it on the in-stent stenosis.  It was inflated to 10 atm for 2 minutes.  There was a clear waist which resolved.  I then deflated the balloon and inflated it centered on the segment under the clavicle to 10 atm for 2 minutes.  The completion injection demonstrated <30% residual stenosis in the stent but ~50% residual stenosis the right subclavian vein at the level of clavicle.  The location is suspicious for possible thoracic outlet compression of the right subclavian vein, so further venoplasty and stenting is unlikely resolve this stenosis completely.  Regardless, the prior pulse in this fistula was resolved at this point with an improved thrill.  Based on the completion imaging, no further  intervention is necessary.  The wire and balloon were removed from the sheath.  A 4-0 Monocryl purse-string suture was sewn around the sheath.  The sheath was removed while tying down the suture.  A sterile bandage was applied to the puncture site.  COMPLICATIONS: none  CONDITION: stable  Adele Barthel, MD [facility] [place] Office:  [phone] Pager: [phone]  05/09/2014 4:25 PM

## 2014-05-09 NOTE — Progress Notes (Signed)
[facility] Progress Note   Subjective: doing better, no fevers last night, dry cough no change. No abd pain, diarhea, no neck pain. +swollen arms R > L   Filed Vitals:   05/09/14 0730 05/09/14 0800 05/09/14 0830 05/09/14 0900  BP: 145/70 133/63 136/62 118/61  Pulse: 81 80 80 86  Temp:      TempSrc:      Resp:      Height:      Weight:      SpO2:       Exam: Alert, calm No jvd Chest clear bilat RRR no MRG Abd soft, NTND, no mass or HSM No ulcer, gangrene Bilat UE edema R > L , no LE edema Neuro is alert, ox 3, nf R AVF +bruit  HD: MWF NW 71.5 kgs 2K/2.25ca 4hr 3000 Heparin R AVF 400/1.5  hectorol 1 mcg IV/HD Aranesp 60 q wends Venofer 100 x5- stop 12/11       Assessment: 1. Recurrent fevers- afebrile. extensive workup during previous admissions. Following with Dr. Campbell/Infection disease. Blood culture no growth to date CT- concerning for PNA. LUL pulmonary nodule. No LE DVT. RUA occlusive DVT. Vanc and cefepime started 2. RUE DVT- heparin gtt, oral anticoagulation. Also for fistulogram Monday w [facility] 3. ESRD - MWF HD 4. Hypertension/volume - BP's normal, close to dry wt. Cont coreg 5. Anemia of CKD - aranesp ^'d 200/wk, just finished Fe load 6. 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 Cont hectorol and phoslo. Last pth 10/28 215 7. Heart transplant- on immunosuppressive therapy 8. DM per primary 9. CAD - hx MI, on imdur, BB, asa, statin   Plan- HD today    Kelly Splinter MD  pager [phone]    cell [phone]  05/09/2014, 9:38 AM     Recent Labs Lab 05/05/14 1726 05/06/14 1205 05/09/14 0432  NA 137 135* 137  K 2.9* 3.9 4.2  CL 93* 94* 99  CO2 30 26 22   GLUCOSE 143* 169* 142*  BUN 17 24* 39*  CREATININE 4.92* 6.60* 6.88*  CALCIUM 8.6 8.5 8.6  PHOS  --  2.3  --     Recent Labs Lab 05/05/14 1726 05/06/14 1205  AST 28  --   ALT 9  --   ALKPHOS 56  --   BILITOT 0.4  --   PROT 5.9*  --   ALBUMIN 2.3* 2.0*    Recent  Labs Lab 05/05/14 1726  05/07/14 0442 05/08/14 0500 05/09/14 0500  WBC 4.5  < > 4.0 2.8* 3.0*  NEUTROABS 2.6  --   --   --   --   HGB 9.1*  < > 8.4* 7.6* 7.5*  HCT 29.8*  < > 26.9* 24.5* 24.2*  MCV 92.5  < > 92.4 91.4 90.6  PLT 121*  < > 99* 96* 100*  < > = values in this interval not displayed. Marland Kitchen aspirin EC  81 mg Oral Daily  . atorvastatin  40 mg Oral Daily  . azaTHIOprine  75 mg Oral q morning - 10a  . calcium acetate  667 mg Oral TID WC  . carvedilol  6.25 mg Oral QHS  . ceFEPIme (MAXIPIME) 2 GM IVP  2 g Intravenous Q M,W,F-2000  . colesevelam  1,875 mg Oral BID WC  . darbepoetin (ARANESP) injection - DIALYSIS  200 mcg Intravenous Q Mon-HD  . doxercalciferol  1 mcg Intravenous Q M,W,F-HD  . famotidine  10 mg Oral QHS  . insulin aspart  0-9 Units  Subcutaneous TID WC  . isosorbide dinitrate  40 mg Oral TID WC  . multivitamin  1 tablet Oral QHS  . pentafluoroprop-tetrafluoroeth      . predniSONE  10 mg Oral Q breakfast  . sirolimus  2 mg Oral Daily  . vancomycin (VANCOCIN) 750 mg IVPB (Hemodialysis)  750 mg Intravenous Q M,W,F-HD   . heparin 950 Units/hr (05/09/14 0903)   acetaminophen **OR** acetaminophen

## 2014-05-09 NOTE — Interval H&P Note (Signed)
[facility] [place]  History and Physical Update  The patient was interviewed and re-examined.  The patient's previous History and Physical has been reviewed and is unchanged from Dr. Nicole Cella consult.  There is no change in the plan of care: R arm fistulogram, possible intervention.  I discussed with the patient the nature of angiographic procedures, especially the limited patencies of any endovascular intervention.  The patient is aware of that the risks of an angiographic procedure include but are not limited to: bleeding, infection, access site complications, renal failure, embolization, rupture of vessel, dissection, possible need for emergent surgical intervention, possible need for surgical procedures to treat the patient's pathology, anaphylactic reaction to contrast, and stroke and death.  The patient is aware of the risks and agrees to proceed.   Adele Barthel, MD [facility] [place] Office: [phone] Pager: [phone]  05/09/2014, 2:29 PM

## 2014-05-09 NOTE — Progress Notes (Addendum)
PATIENT DETAILS Name: Brandon Sharp Age: 68 y.o. Sex: male Date of Birth: 10-18-1945 Admit Date: 05/05/2014 Admitting Physician Orvan Falconer, MD [facility]:[id number],GEZMOQH C, MD  Subjective: Denies any major complaints-anxious to go home  Assessment/Plan: Principal Problem:   Fever of unknown origin (FUO):ongoing for 4-6 weeks-has had 2 recent hospitalizations for the same issue, so far extensive workup including blood cultures, urine cultures, bronchial with negative AFB and fungal smears so far, question multifocal pneumonia on CT scan. Further workup has demonstrated a right IJ DVT and also has significant swelling of his right arm. Fevers either from ongoing pneumonia or septic thrombophlebitis. Infectious disease following, remains on empiric antibiotics as outlined below. He remains on IV heparin for right IJ DVT.  Active Problems:  Right IJ DVT: Currently on IV heparin, given swelling of his right upper extremity-seen by vascular surgery for a fistulogram of his right upper extremity today, following which will start coumadin.Interestingly he has had a hx of prior DVT, suspect he may need life long anticoagulation  Heart transplant, failed kidney transplant: Continue home immunosuppressants. Follow with transplant clinic post discharge.  Anemia:suspect secondary to acute illness on chronic anemia. Follow CBC and transfuse prn.  Thrombocytopenia:suspet secondary to acute illness-watch closely as on Heparin, if drops significantly will send out HIT panel.   ESRD: On Monday Wednesday Friday schedule. Renal consulted.   DM (diabetes mellitus), type 2 with renal complications: CBGs stable, continue with SSI.   Hypertension: Continue with Coreg, Imdur   Dyslipidemia: Continue with statin   Protein-calorie malnutrition, severe: Continue with supplements.  Disposition: Remain inpatient  Antibiotics:  See below   Anti-infectives    Start     Dose/Rate Route Frequency Ordered  Stop   05/09/14 1200  vancomycin (VANCOCIN) 750 mg in sodium chloride 0.9 % 150 mL IVPB     750 mg150 mL/hr over 60 Minutes Intravenous Every M-W-F (Hemodialysis) 05/06/14 1639     05/06/14 2000  ceFEPIme (MAXIPIME) 2 g in dextrose 5 % 50 mL IVPB     2 g100 mL/hr over 30 Minutes Intravenous Every M-W-F (2000) 05/06/14 1641     05/06/14 1645  vancomycin (VANCOCIN) 1,250 mg in sodium chloride 0.9 % 250 mL IVPB     1,250 mg166.7 mL/hr over 90 Minutes Intravenous  Once 05/06/14 1639 05/06/14 1901      DVT Prophylaxis: IV Heparin  Code Status: Full code   Family Communication None at bedside  Procedures:  None  CONSULTS:  ID, nephrology and vascular surgery  MEDICATIONS: Scheduled Meds: . aspirin EC  81 mg Oral Daily  . atorvastatin  40 mg Oral Daily  . azaTHIOprine  75 mg Oral q morning - 10a  . calcium acetate  667 mg Oral TID WC  . carvedilol  6.25 mg Oral QHS  . ceFEPIme (MAXIPIME) 2 GM IVP  2 g Intravenous Q M,W,F-2000  . colesevelam  1,875 mg Oral BID WC  . darbepoetin (ARANESP) injection - DIALYSIS  200 mcg Intravenous Q Mon-HD  . doxercalciferol  1 mcg Intravenous Q M,W,F-HD  . famotidine  10 mg Oral QHS  . insulin aspart  0-9 Units Subcutaneous TID WC  . isosorbide dinitrate  40 mg Oral TID WC  . multivitamin  1 tablet Oral QHS  . pentafluoroprop-tetrafluoroeth      . predniSONE  10 mg Oral Q breakfast  . sirolimus  2 mg Oral Daily  . vancomycin (VANCOCIN) 750 mg IVPB (Hemodialysis)  750  mg Intravenous Q M,W,F-HD   Continuous Infusions: . heparin 950 Units/hr (05/09/14 0903)   PRN Meds:.acetaminophen **OR** acetaminophen    PHYSICAL EXAM: Vital signs in last 24 hours: Filed Vitals:   05/09/14 0900 05/09/14 0930 05/09/14 1000 05/09/14 1102  BP: 118/61 123/61 136/62 141/56  Pulse: 86 81 82 82  Temp:   97.7 F (36.5 C) 98 F (36.7 C)  TempSrc:   Oral Oral  Resp:    18  Height:      Weight:   70.7 kg (155 lb 13.8 oz)   SpO2:   100% 98%    Weight  change: 0.964 kg (2 lb 2 oz) Filed Weights   05/08/14 2111 05/09/14 0621 05/09/14 1000  Weight: 74.9 kg (165 lb 2 oz) 73.7 kg (162 lb 7.7 oz) 70.7 kg (155 lb 13.8 oz)   Body mass index is 21.13 kg/(m^2).   Gen Exam: Awake and alert with clear speech.   Neck: Supple, No JVD.   Chest: B/L Clear.   CVS: S1 S2 Regular, no murmurs.  Abdomen: soft, BS +, non tender, non distended.  Extremities: no edema, lower extremities warm to touch.RUE swelling Neurologic: Non Focal.   Skin: No Rash.   Wounds: N/A.    Intake/Output from previous day:  Intake/Output Summary (Last 24 hours) at 05/09/14 1331 Last data filed at 05/09/14 1000  Gross per 24 hour  Intake    120 ml  Output   3004 ml  Net  -2884 ml     LAB RESULTS: CBC  Recent Labs Lab 05/05/14 1726 05/06/14 1830 05/07/14 0442 05/08/14 0500 05/09/14 0500  WBC 4.5 4.8 4.0 2.8* 3.0*  HGB 9.1* 8.8* 8.4* 7.6* 7.5*  HCT 29.8* 28.0* 26.9* 24.5* 24.2*  PLT 121* 110* 99* 96* 100*  MCV 92.5 93.6 92.4 91.4 90.6  MCH 28.3 29.4 28.9 28.4 28.1  MCHC 30.5 31.4 31.2 31.0 31.0  RDW 16.5* 16.6* 16.6* 16.5* 16.3*  LYMPHSABS 1.4  --   --   --   --   MONOABS 0.3  --   --   --   --   EOSABS 0.1  --   --   --   --   BASOSABS 0.0  --   --   --   --     Chemistries   Recent Labs Lab 05/05/14 1726 05/06/14 1205 05/09/14 0432 05/09/14 0644  NA 137 135* 137 135*  K 2.9* 3.9 4.2 4.2  CL 93* 94* 99 98  CO2 30 26 22 22   GLUCOSE 143* 169* 142* 141*  BUN 17 24* 39* 40*  CREATININE 4.92* 6.60* 6.88* 6.93*  CALCIUM 8.6 8.5 8.6 8.7    CBG:  Recent Labs Lab 05/08/14 0736 05/08/14 1152 05/08/14 1648 05/08/14 2109 05/09/14 1104  GLUCAP 117* 120* 170* 145* 110*    GFR Estimated Creatinine Clearance: 10.1 mL/min (by C-G formula based on Cr of 6.93).  Coagulation profile  Recent Labs Lab 05/06/14 1830  INR 1.13    Cardiac Enzymes No results for input(s): CKMB, TROPONINI, MYOGLOBIN in the last 168 hours.  Invalid input(s):  CK  Invalid input(s): POCBNP No results for input(s): DDIMER in the last 72 hours. No results for input(s): HGBA1C in the last 72 hours. No results for input(s): CHOL, HDL, LDLCALC, TRIG, CHOLHDL, LDLDIRECT in the last 72 hours. No results for input(s): TSH, T4TOTAL, T3FREE, THYROIDAB in the last 72 hours.  Invalid input(s): FREET3 No results for input(s): VITAMINB12, FOLATE, FERRITIN, TIBC, IRON,  RETICCTPCT in the last 72 hours. No results for input(s): LIPASE, AMYLASE in the last 72 hours.  Urine Studies No results for input(s): UHGB, CRYS in the last 72 hours.  Invalid input(s): UACOL, UAPR, USPG, UPH, UTP, UGL, UKET, UBIL, UNIT, UROB, ULEU, UEPI, UWBC, URBC, UBAC, CAST, UCOM, BILUA  MICROBIOLOGY: Recent Results (from the past 240 hour(s))  Culture, blood (routine x 2)     Status: None (Preliminary result)   Collection Time: 05/05/14  8:44 PM  Result Value Ref Range Status   Specimen Description BLOOD ARM LEFT  Final   Special Requests BOTTLES DRAWN AEROBIC AND ANAEROBIC 10CC  Final   Culture  Setup Time   Final    05/06/2014 00:38 Performed at [facility]    Culture   Final           BLOOD CULTURE RECEIVED NO GROWTH TO DATE CULTURE WILL BE HELD FOR 5 DAYS BEFORE ISSUING A FINAL NEGATIVE REPORT Performed at [facility]    Report Status PENDING  Incomplete  Culture, blood (routine x 2)     Status: None (Preliminary result)   Collection Time: 05/05/14  8:51 PM  Result Value Ref Range Status   Specimen Description BLOOD WRIST LEFT  Final   Special Requests BOTTLES DRAWN AEROBIC AND ANAEROBIC 5CC  Final   Culture  Setup Time   Final    05/06/2014 00:39 Performed at [facility]    Culture   Final           BLOOD CULTURE RECEIVED NO GROWTH TO DATE CULTURE WILL BE HELD FOR 5 DAYS BEFORE ISSUING A FINAL NEGATIVE REPORT Performed at [facility]    Report Status PENDING  Incomplete  MRSA PCR Screening     Status: None   Collection Time:  05/06/14 12:19 AM  Result Value Ref Range Status   MRSA by PCR NEGATIVE NEGATIVE Final    Comment:        The GeneXpert MRSA Assay (FDA approved for NASAL specimens only), is one component of a comprehensive MRSA colonization surveillance program. It is not intended to diagnose MRSA infection nor to guide or monitor treatment for MRSA infections.   Culture, blood (routine x 2)     Status: None (Preliminary result)   Collection Time: 05/06/14  4:50 PM  Result Value Ref Range Status   Specimen Description BLOOD LEFT ARM  Final   Special Requests BOTTLES DRAWN AEROBIC AND ANAEROBIC 10CC  Final   Culture  Setup Time   Final    05/06/2014 22:41 Performed at [facility]    Culture   Final           BLOOD CULTURE RECEIVED NO GROWTH TO DATE CULTURE WILL BE HELD FOR 5 DAYS BEFORE ISSUING A FINAL NEGATIVE REPORT Performed at [facility]    Report Status PENDING  Incomplete    RADIOLOGY STUDIES/RESULTS: Dg Chest 2 View  05/05/2014   CLINICAL DATA:  Fever of 102. Vomiting started today. Cough for 3 months. History of heart transplant in 2001. Diabetes, hypertension, CHF. Dialysis. CABG x3.  EXAM: CHEST  2 VIEW  COMPARISON:  04/14/2014  FINDINGS: Status post median sternotomy. Heart is enlarged. Stents are identified in the upper chest. There has been some improvement in aeration of the lung bases. Patchy densities persist however. No definite pleural effusions. No pulmonary edema.  IMPRESSION: Cardiomegaly. Persistent bilateral airspace filling opacities with some improvement in aeration since prior studies.  Electronically Signed   By: Shon Hale M.D.   On: 05/05/2014 21:00   Ct Chest Wo Contrast  04/13/2014   CLINICAL DATA:  68 year old immunocompromised male with intermittent fever. Initial encounter. Current history of renal failure status post renal transplant and transplant project.  EXAM: CT CHEST WITHOUT CONTRAST  TECHNIQUE: Multidetector CT imaging of the chest  was performed following the standard protocol without IV contrast.  COMPARISON:  Chest radiograph 04/11/2014 and earlier. Chest CT 04/07/2014  FINDINGS: Improved lung volumes. Decreased dependent atelectasis in both lungs, however, confluent peribronchovascular opacity persists in both lower lobes in the lingula, with central air bronchograms. See series 3. The right middle lobe is spared. The right upper lobe is spared. The superior aspect of the left upper lobe is spared.  Only trace associated left pleural fluid. No pericardial effusion. Stable cardiomegaly. No hilar or mediastinal lymphadenopathy. No axillary lymphadenopathy.  Gynecomastia. Stable visualized upper abdominal viscera. Aortic ectasia and calcified atherosclerosis re- identified. Coronary artery stents and atherosclerosis. Sequelae of median sternotomy. Right subclavian venous vascular stent re- identified.  No acute osseous abnormality identified.  IMPRESSION: 1. Improved lung volumes with resolved dependent atelectasis, but persistent confluent bilateral lower lobe and lingula peribronchovascular consolidation. Favor bronchopneumonia in this setting. Noninfectious considerations such as post transplant lymphoproliferative disorder are felt less likely. 2. Trace left pleural fluid. 3. Otherwise stable chest and upper abdominal viscera compared to 04/07/2014.   Electronically Signed   By: Lars Pinks M.D.   On: 04/13/2014 06:00   Ct Chest W Contrast  05/06/2014   CLINICAL DATA:  68 year old male with fever of unknown origin  EXAM: CT CHEST, ABDOMEN, AND PELVIS WITH CONTRAST  TECHNIQUE: Multidetector CT imaging of the chest, abdomen and pelvis was performed following the standard protocol during bolus administration of intravenous contrast.  CONTRAST:  68mL OMNIPAQUE IOHEXOL 300 MG/ML  SOLN  COMPARISON:  04/13/2014  FINDINGS: CT CHEST FINDINGS  Heart/mediastinum: Median sternotomy wires are present. The heart is mildly enlarged. Scattered  atherosclerotic aortic and coronary artery calcifications are present. There is no pericardial effusion. There is mild aneurysmal dilatation of the ascending aorta measured 3.9 cm. There is no aortic dissection. Several collateral vessels are noted within the soft tissues of the left lateral chest. An unchanged 7 mm left upper lobe pulmonary nodule is present (series 2, image 32).  There are no pathologically axillary lymph nodes.  Chest: Upper lobe predominant emphysematous changes are present. Similar appearing bibasilar and linear patchy areas of airspace consolidation is noted. Interstitial thickening is also present. Small bilateral pleural effusions are present. There is no pneumothorax upper lobe predominant centrilobular and paraseptal emphysematous changes are present. No suspicious pulmonary nodules or masses are identified. A few scattered calcified pulmonary nodules are noted.  Gynecomastia is noted.  CT ABDOMEN AND PELVIS FINDINGS  Liver: A 2.2 cm right and left hepatic lobe cysts are noted. Additional subcentimeter hypodense renal lesions are too small to fully characterize.  Gallbladder: There is no cholelithiasis or biliary sludge. There is no pericholecystic fluid. There is no abnormal gallbladder wall thickening. There is no intrahepatic or extrahepatic biliary ductal dilatation.  Spleen: Unremarkable.  Pancreas: Unremarkable.  Adrenal glands: Unremarkable.  Kidneys: Multiple hypodense lesions of varying sizes are seen noted throughout the kidneys, compatible with cysts. Renal cortical thinning is also present bilaterally. There is no hydronephrosis. Several tiny nonobstructing calcific densities are noted bilaterally. A right lower quadrant renal transplant is present.  Bowel/gastrointestinal tract: There is colonic diverticulosis without definite  evidence for acute diverticulitis. There is no abnormal bowel wall thickening. There is no evidence of bowel obstruction  Pelvis: There is no pelvic  mass. The urinary bladder is decompressed. The prostate gland is mildly enlarged and measures approximately 5.1 x 2.5 cm.  Miscellaneous: Extensive vascular calcifications are present. There is mild ectasia of the abdominal aorta.  Osseous structures: Multilevel degenerative changes of the spine are present. Degenerative changes of the hips are also noted. No acute osseous abnormality is identified.  IMPRESSION: 1. Findings remain concerning for multifocal pneumonia. 2. Colonic diverticulosis without definite evidence for acute diverticulitis. 3. Chronic medical renal disease. 4. Prostatic enlargement. 5. Aneurysmal dilatation of the ascending aorta. 6. Cardiomegaly. 7. 7 mm left upper lobe pulmonary nodule. If the patient is at high risk for bronchogenic carcinoma, follow-up chest CT at 3-16months is recommended. If the patient is at low risk for bronchogenic carcinoma, follow-up chest CT at 6-12 months is recommended. This recommendation follows the consensus statement: Guidelines for Management of Small Pulmonary Nodules Detected on CT Scans: A Statement from the [place] as published in Radiology 2005; 237:395-400. 8. Probable superimposed mild interstitial edema and trace bilateral pleural effusions.   Electronically Signed   By: Rosemarie Ax   On: 05/06/2014 08:39   Mr Brain Wo Contrast  04/14/2014   CLINICAL DATA:  68 year old male with immunosuppression following organ transplantation presents with symptoms of fever, malaise, and encephalopathy. Initial encounter.  EXAM: MRI HEAD WITHOUT CONTRAST  TECHNIQUE: Multiplanar, multiecho pulse sequences of the brain and surrounding structures were obtained without intravenous contrast.  COMPARISON:  CT head 04/06/2014.  MR head 05/15/2003.  FINDINGS: There is a 1 cm sized area of diffusion restriction in the RIGHT occipital subcortical white matter with slight associated T2 and FLAIR prolongation. No other similar lesions. No acute hemorrhage is  present, although is small focus chronic hemorrhage was noted in 2004 in the RIGHT posterior temporal subcortical white matter, with additional tiny foci of chronic hemorrhage in the RIGHT temporal and LEFT frontal cortex.  Generalized atrophy. Multiple BILATERAL chronic cerebellar infarcts. BILATERAL deep nuclei chronic lacunar infarcts. Minor changes of subcortical and periventricular white matter signal abnormality, likely chronic microvascular ischemic change.  Normal pituitary and cerebellar tonsils. Advanced cervical spondylosis with suspected slight cord compression.  Flow voids are maintained in the carotid, basilar, and vertebral arteries.  Abnormalities of the extracranial soft tissues include significant fluid accumulation with probable retention cyst formation the LEFT maxillary sinus causing expansion, and BILATERAL T2 hyperintense fluid in the mastoid air cells, favored to represent benign effusions. No sinus air-fluid level. Negative orbits. Extracranial soft tissues unremarkable.  No contrast was given in this patient renal failure, but there are no obvious features of pachymeningeal thickening or sulcal FLAIR hyperintensity to suggest meningitis.  IMPRESSION: Nonspecific 1 cm focus of restricted diffusion in the RIGHT occipital subcortical white matter. The patient has evidence for multiple remote areas of infarction and certainly this is a leading differential consideration.  In the setting of immunosuppression, with the patient on Imuran, neurotoxicity secondary to this drug is associated with abnormal diffusion signal in the posterior circulation, including occipital cortex and white matter. Similar findings were seen on prior MR from 2004, but were more extensive at that time.  Cerebritis or brain abscess, well also associated with restricted diffusion, is not favored based on lesion morphology and lack of surrounding vasogenic edema. Intracranial infection is not favored as a cause of the  patient's fever. In addition, LEFT maxillary  sinus disease and BILATERAL mastoid effusions are likely non-worrisome.   Electronically Signed   By: Rolla Flatten M.D.   On: 04/14/2014 15:44   Ct Abdomen Pelvis W Contrast  05/06/2014   CLINICAL DATA:  68 year old male with fever of unknown origin  EXAM: CT CHEST, ABDOMEN, AND PELVIS WITH CONTRAST  TECHNIQUE: Multidetector CT imaging of the chest, abdomen and pelvis was performed following the standard protocol during bolus administration of intravenous contrast.  CONTRAST:  8mL OMNIPAQUE IOHEXOL 300 MG/ML  SOLN  COMPARISON:  04/13/2014  FINDINGS: CT CHEST FINDINGS  Heart/mediastinum: Median sternotomy wires are present. The heart is mildly enlarged. Scattered atherosclerotic aortic and coronary artery calcifications are present. There is no pericardial effusion. There is mild aneurysmal dilatation of the ascending aorta measured 3.9 cm. There is no aortic dissection. Several collateral vessels are noted within the soft tissues of the left lateral chest. An unchanged 7 mm left upper lobe pulmonary nodule is present (series 2, image 32).  There are no pathologically axillary lymph nodes.  Chest: Upper lobe predominant emphysematous changes are present. Similar appearing bibasilar and linear patchy areas of airspace consolidation is noted. Interstitial thickening is also present. Small bilateral pleural effusions are present. There is no pneumothorax upper lobe predominant centrilobular and paraseptal emphysematous changes are present. No suspicious pulmonary nodules or masses are identified. A few scattered calcified pulmonary nodules are noted.  Gynecomastia is noted.  CT ABDOMEN AND PELVIS FINDINGS  Liver: A 2.2 cm right and left hepatic lobe cysts are noted. Additional subcentimeter hypodense renal lesions are too small to fully characterize.  Gallbladder: There is no cholelithiasis or biliary sludge. There is no pericholecystic fluid. There is no abnormal  gallbladder wall thickening. There is no intrahepatic or extrahepatic biliary ductal dilatation.  Spleen: Unremarkable.  Pancreas: Unremarkable.  Adrenal glands: Unremarkable.  Kidneys: Multiple hypodense lesions of varying sizes are seen noted throughout the kidneys, compatible with cysts. Renal cortical thinning is also present bilaterally. There is no hydronephrosis. Several tiny nonobstructing calcific densities are noted bilaterally. A right lower quadrant renal transplant is present.  Bowel/gastrointestinal tract: There is colonic diverticulosis without definite evidence for acute diverticulitis. There is no abnormal bowel wall thickening. There is no evidence of bowel obstruction  Pelvis: There is no pelvic mass. The urinary bladder is decompressed. The prostate gland is mildly enlarged and measures approximately 5.1 x 2.5 cm.  Miscellaneous: Extensive vascular calcifications are present. There is mild ectasia of the abdominal aorta.  Osseous structures: Multilevel degenerative changes of the spine are present. Degenerative changes of the hips are also noted. No acute osseous abnormality is identified.  IMPRESSION: 1. Findings remain concerning for multifocal pneumonia. 2. Colonic diverticulosis without definite evidence for acute diverticulitis. 3. Chronic medical renal disease. 4. Prostatic enlargement. 5. Aneurysmal dilatation of the ascending aorta. 6. Cardiomegaly. 7. 7 mm left upper lobe pulmonary nodule. If the patient is at high risk for bronchogenic carcinoma, follow-up chest CT at 3-66months is recommended. If the patient is at low risk for bronchogenic carcinoma, follow-up chest CT at 6-12 months is recommended. This recommendation follows the consensus statement: Guidelines for Management of Small Pulmonary Nodules Detected on CT Scans: A Statement from the [place] as published in Radiology 2005; 237:395-400. 8. Probable superimposed mild interstitial edema and trace bilateral pleural  effusions.   Electronically Signed   By: Rosemarie Ax   On: 05/06/2014 08:39   Dg Chest Port 1 View  05/08/2014   CLINICAL DATA:  Oxygen desaturation.  EXAM: PORTABLE CHEST - 1 VIEW  COMPARISON:  May 05, 2014.  FINDINGS: Stable cardiomegaly. Sternotomy wires are again noted. Bilateral subclavian vascular stents are again noted. No pneumothorax is noted. Increased bibasilar opacities are noted concerning for worsening edema or pneumonia. Minimal bilateral pleural effusions are noted. Bony thorax is intact.  IMPRESSION: Increased bibasilar opacities are noted concerning for worsening edema or possibly pneumonia. Minimal bilateral pleural effusions are noted as well.   Electronically Signed   By: Sabino Dick M.D.   On: 05/08/2014 18:06   Dg Chest Port 1 View  04/14/2014   CLINICAL DATA:  Status post left bronchoscopy lung biopsy.  EXAM: PORTABLE CHEST - 1 VIEW  COMPARISON:  04/11/2014  FINDINGS: Persistent bibasilar pulmonary airspace opacity again seen, left side greater than right. No evidence of pneumothorax. Cardiomegaly is stable. Bilateral subclavian stents again seen in place. Patient has undergone previous median sternotomy.  IMPRESSION: No significant change in bibasilar airspace disease and cardiomegaly. No pneumothorax identified.   Electronically Signed   By: Earle Gell M.D.   On: 04/14/2014 12:32   Dg Chest Port 1 View  04/11/2014   CLINICAL DATA:  Fever and chills.  EXAM: PORTABLE CHEST - 1 VIEW  COMPARISON:  Chest CT, 04/07/2014.  Chest radiographs, 04/06/2014.  FINDINGS: Patchy areas of left mid and lower lung consolidation and milder right lung base consolidation, this similar to the prior studies allowing for differences in technique and inspiratory volumes. No new areas of lung consolidation. No convincing pulmonary edema. No pleural effusion or pneumothorax.  Changes from cardiac surgery are stable. Cardiac silhouette is mildly enlarged. No mediastinal or hilar masses. Stable  right subclavian vein stent.  IMPRESSION: Persist consolidation in the left mid and lower lung and in the right lung base similar to the prior studies. This is most suggestive of multifocal pneumonia. No new abnormalities.   Electronically Signed   By: Lajean Manes M.D.   On: 04/11/2014 20:46   Dg C-arm Bronchoscopy  04/14/2014   CLINICAL DATA:    C-ARM BRONCHOSCOPY  Fluoroscopy was utilized by the requesting physician.  No radiographic  interpretation.     Oren Binet, MD  [facility] Pager:[phone]  If 7PM-7AM, please contact night-coverage [email or web address] Password [email or web address] 05/09/2014, 1:31 PM   LOS: 4 days

## 2014-05-09 NOTE — Progress Notes (Signed)
ANTICOAGULATION CONSULT NOTE - Follow Up Consult  Pharmacy Consult:  Heparin  Indication: DVT, right internal jugular  Allergies  Allergen Reactions  . Lisinopril Swelling    Lips and tongue swell  . Niacin And Related Other (See Comments)    unknown  . Norvasc [Amlodipine Besylate] Rash    Flushing  . Penicillins Rash   Patient Measurements: Height: 6' (182.9 cm) Weight: 162 lb 7.7 oz (73.7 kg) IBW/kg (Calculated) : 77.6  Vital Signs: Temp: 97.7 F (36.5 C) (12/14 0621) Temp Source: Oral (12/14 0621) BP: 136/62 mmHg (12/14 0830) Pulse Rate: 80 (12/14 0830)  Labs:  Recent Labs  05/06/14 1205  05/06/14 1830  05/07/14 0442 05/07/14 1243 05/08/14 0500 05/09/14 0432 05/09/14 0500  HGB  --   < > 8.8*  --  8.4*  --  7.6*  --  7.5*  HCT  --   < > 28.0*  --  26.9*  --  24.5*  --  24.2*  PLT  --   < > 110*  --  99*  --  96*  --  100*  APTT  --   --  48*  --   --   --   --   --   --   LABPROT  --   --  14.7  --   --   --   --   --   --   INR  --   --  1.13  --   --   --   --   --   --   HEPARINUNFRC  --   --   --   < > 0.47 0.58 0.60  --  0.78*  CREATININE 6.60*  --   --   --   --   --   --  6.88*  --   < > = values in this interval not displayed.    Assessment: 68 y/o male with ESRD on a heparin drip for right IJ vein DVT.  Heparin level is above goal today.  No bleeding reported but patient's hemoglobin is drifting down,  Pltc is 100K , up slightly from yesterday.   Goal of Therapy:  Heparin level 0.3-0.7 units/ml Monitor platelets by anticoagulation protocol: Yes    Plan:  - Decrease heparin gtt to 950 units/hr. - Check heparin level in 6 hours. - Daily CBC / HL - F/U plan for long term anticoagulant - Continue vanc 750mg  IV q-HD MWF and Cefepime 2gm IV q-HD MWF   Nicole Cella, RPh Clinical Pharmacist Pager: [phone] Pager:  [phone] 05/09/2014, 8:51 AM

## 2014-05-09 NOTE — Progress Notes (Signed)
[facility]  Patient Status: Active (receiving services up to time of hospitalization)  [facility] is providing the following services: PT  If patient discharges after hours, please call [phone].   Brandon Sharp 05/09/2014, 8:01 PM

## 2014-05-09 NOTE — Progress Notes (Signed)
INFECTIOUS DISEASE PROGRESS NOTE  ID: TEMITAYO COVALT is a 68 y.o. male with  Principal Problem:   Fever of unknown origin (FUO) Active Problems:   Heart transplanted   DM (diabetes mellitus), type 2 with renal complications   ESRD on hemodialysis   Healthcare associated bacterial pneumonia   FUO (fever of unknown origin)   Protein-calorie malnutrition, severe  Subjective: Without complaints  Abtx:  Anti-infectives    Start     Dose/Rate Route Frequency Ordered Stop   05/09/14 1200  vancomycin (VANCOCIN) 750 mg in sodium chloride 0.9 % 150 mL IVPB     750 mg150 mL/hr over 60 Minutes Intravenous Every M-W-F (Hemodialysis) 05/06/14 1639     05/06/14 2000  ceFEPIme (MAXIPIME) 2 g in dextrose 5 % 50 mL IVPB     2 g100 mL/hr over 30 Minutes Intravenous Every M-W-F (2000) 05/06/14 1641     05/06/14 1645  vancomycin (VANCOCIN) 1,250 mg in sodium chloride 0.9 % 250 mL IVPB     1,250 mg166.7 mL/hr over 90 Minutes Intravenous  Once 05/06/14 1639 05/06/14 1901      Medications:  Scheduled: . aspirin EC  81 mg Oral Daily  . atorvastatin  40 mg Oral Daily  . azaTHIOprine  75 mg Oral q morning - 10a  . calcium acetate  667 mg Oral TID WC  . carvedilol  6.25 mg Oral QHS  . ceFEPIme (MAXIPIME) 2 GM IVP  2 g Intravenous Q M,W,F-2000  . colesevelam  1,875 mg Oral BID WC  . darbepoetin (ARANESP) injection - DIALYSIS  200 mcg Intravenous Q Mon-HD  . doxercalciferol  1 mcg Intravenous Q M,W,F-HD  . famotidine  10 mg Oral QHS  . insulin aspart  0-9 Units Subcutaneous TID WC  . isosorbide dinitrate  40 mg Oral TID WC  . multivitamin  1 tablet Oral QHS  . pentafluoroprop-tetrafluoroeth      . predniSONE  10 mg Oral Q breakfast  . sirolimus  2 mg Oral Daily  . sodium chloride  3 mL Intravenous Q12H  . vancomycin (VANCOCIN) 750 mg IVPB (Hemodialysis)  750 mg Intravenous Q M,W,F-HD    Objective: Vital signs in last 24 hours: Temp:  [97.7 F (36.5 C)-98.7 F (37.1 C)] 98.1 F (36.7 C)  (12/14 1547) Pulse Rate:  [77-89] 89 (12/14 1547) Resp:  [18] 18 (12/14 1547) BP: (116-156)/(48-78) 155/53 mmHg (12/14 1547) SpO2:  [92 %-100 %] 92 % (12/14 1547) Weight:  [70.7 kg (155 lb 13.8 oz)-74.9 kg (165 lb 2 oz)] 70.7 kg (155 lb 13.8 oz) (12/14 1000)   General appearance: alert, cooperative and no distress Resp: clear to auscultation bilaterally Cardio: regular rate and rhythm GI: normal findings: bowel sounds normal and soft, non-tender Extremities: RUE AVG +bruit, non-tender  Lab Results  Recent Labs  05/08/14 0500 05/09/14 0432 05/09/14 0500 05/09/14 0644  WBC 2.8*  --  3.0*  --   HGB 7.6*  --  7.5*  --   HCT 24.5*  --  24.2*  --   NA  --  137  --  135*  K  --  4.2  --  4.2  CL  --  99  --  98  CO2  --  22  --  22  BUN  --  39*  --  40*  CREATININE  --  6.88*  --  6.93*   Liver Panel  Recent Labs  05/09/14 0644  ALBUMIN 2.1*   Sedimentation Rate No  results for input(s): ESRSEDRATE in the last 72 hours. C-Reactive Protein No results for input(s): CRP in the last 72 hours.  Microbiology: Recent Results (from the past 240 hour(s))  Culture, blood (routine x 2)     Status: None (Preliminary result)   Collection Time: 05/05/14  8:44 PM  Result Value Ref Range Status   Specimen Description BLOOD ARM LEFT  Final   Special Requests BOTTLES DRAWN AEROBIC AND ANAEROBIC 10CC  Final   Culture  Setup Time   Final    05/06/2014 00:38 Performed at [facility]    Culture   Final           BLOOD CULTURE RECEIVED NO GROWTH TO DATE CULTURE WILL BE HELD FOR 5 DAYS BEFORE ISSUING A FINAL NEGATIVE REPORT Performed at [facility]    Report Status PENDING  Incomplete  Culture, blood (routine x 2)     Status: None (Preliminary result)   Collection Time: 05/05/14  8:51 PM  Result Value Ref Range Status   Specimen Description BLOOD WRIST LEFT  Final   Special Requests BOTTLES DRAWN AEROBIC AND ANAEROBIC 5CC  Final   Culture  Setup Time   Final     05/06/2014 00:39 Performed at [facility]    Culture   Final           BLOOD CULTURE RECEIVED NO GROWTH TO DATE CULTURE WILL BE HELD FOR 5 DAYS BEFORE ISSUING A FINAL NEGATIVE REPORT Performed at [facility]    Report Status PENDING  Incomplete  MRSA PCR Screening     Status: None   Collection Time: 05/06/14 12:19 AM  Result Value Ref Range Status   MRSA by PCR NEGATIVE NEGATIVE Final    Comment:        The GeneXpert MRSA Assay (FDA approved for NASAL specimens only), is one component of a comprehensive MRSA colonization surveillance program. It is not intended to diagnose MRSA infection nor to guide or monitor treatment for MRSA infections.   Culture, blood (routine x 2)     Status: None (Preliminary result)   Collection Time: 05/06/14  4:50 PM  Result Value Ref Range Status   Specimen Description BLOOD LEFT ARM  Final   Special Requests BOTTLES DRAWN AEROBIC AND ANAEROBIC 10CC  Final   Culture  Setup Time   Final    05/06/2014 22:41 Performed at [facility]    Culture   Final           BLOOD CULTURE RECEIVED NO GROWTH TO DATE CULTURE WILL BE HELD FOR 5 DAYS BEFORE ISSUING A FINAL NEGATIVE REPORT Performed at [facility]    Report Status PENDING  Incomplete    Studies/Results: Dg Chest Port 1 View  05/08/2014   CLINICAL DATA:  Oxygen desaturation.  EXAM: PORTABLE CHEST - 1 VIEW  COMPARISON:  May 05, 2014.  FINDINGS: Stable cardiomegaly. Sternotomy wires are again noted. Bilateral subclavian vascular stents are again noted. No pneumothorax is noted. Increased bibasilar opacities are noted concerning for worsening edema or pneumonia. Minimal bilateral pleural effusions are noted. Bony thorax is intact.  IMPRESSION: Increased bibasilar opacities are noted concerning for worsening edema or possibly pneumonia. Minimal bilateral pleural effusions are noted as well.   Electronically Signed   By: Sabino Dick M.D.   On: 05/08/2014 18:06      Assessment/Plan: R IJ DVT FUO  For fistulagram- pt states he had this today and had clot removed Will add  anaerobic coverage (? [facility]) Doing well.   Total days of antibiotics : 4 vanco/cefepime            Bobby Rumpf Infectious Diseases (pager) [phone] [email or web address] 05/09/2014, 4:29 PM  LOS: 4 days

## 2014-05-09 NOTE — Consult Note (Signed)
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 Lisinopril Swelling    Lips and tongue swell  . Niacin And Related Other (See Comments)    unknown  . Norvasc [Amlodipine Besylate] Rash    Flushing  . Penicillins Rash    Patient Measurements: Height: 6' (182.9 cm) Weight: 162 lb 7.7 oz (73.7 kg) IBW/kg (Calculated) : 77.6  Vital Signs: BP 118/61 mmHg  Pulse 86  Temp(Src) 97.7 F (36.5 C) (Oral)  Resp 18  Ht 6' (1.829 m)  Wt 162 lb 7.7 oz (73.7 kg)  BMI 22.03 kg/m2  SpO2 98%  Labs:  Recent Labs  05/06/14 1205  05/07/14 0442 05/08/14 0500 05/09/14 0432 05/09/14 0500  WBC  --   < > 4.0 2.8*  --  3.0*  HGB  --   < > 8.4* 7.6*  --  7.5*  PLT  --   < > 99* 96*  --  100*  CREATININE 6.60*  --   --   --  6.88*  --   < > = values in this interval not displayed. Estimated Creatinine Clearance: 10.6 mL/min (by C-G formula based on Cr of 6.88).   Microbiology: Recent Results (from the past 720 hour(s))  Blood culture (routine x 2)     Status: None   Collection Time: 04/06/14  6:55 PM  Result Value Ref Range Status   Specimen Description BLOOD RIGHT ARM  Final   Special Requests BOTTLES DRAWN AEROBIC AND ANAEROBIC 5ML  Final   Culture  Setup Time   Final    04/07/2014 01:07 Performed at [facility]    Culture   Final    NO GROWTH 5 DAYS Performed at [facility]    Report Status 04/13/2014 FINAL  Final  Blood culture (routine x 2)     Status: None   Collection  Time: 04/06/14  7:12 PM  Result Value Ref Range Status   Specimen Description BLOOD RIGHT ARM  Final   Special Requests BOTTLES DRAWN AEROBIC AND ANAEROBIC 5ML  Final   Culture  Setup Time   Final    04/07/2014 01:07 Performed at [place]   Final    NO GROWTH 5 DAYS Performed at [facility]    Report Status 04/13/2014 FINAL  Final  MRSA PCR Screening     Status: None   Collection Time: 04/07/14  7:02 AM  Result Value Ref Range Status   MRSA by PCR NEGATIVE NEGATIVE Final    Comment:        The GeneXpert MRSA Assay (FDA approved for NASAL specimens only), is one component of a comprehensive MRSA colonization surveillance program. It is not intended to diagnose MRSA infection nor to guide or monitor treatment for MRSA infections.   Culture, blood (routine x 2)     Status: None   Collection Time: 04/11/14  9:01 PM  Result Value Ref Range Status   Specimen Description BLOOD LEFT ARM  Final  Special Requests BOTTLES DRAWN AEROBIC AND ANAEROBIC 10CC EACH  Final   Culture  Setup Time   Final    04/12/2014 01:15 Performed at [facility]   Final    NO GROWTH 5 DAYS Performed at [facility]    Report Status 04/18/2014 FINAL  Final  Fungus culture, blood     Status: None   Collection Time: 04/11/14  9:01 PM  Result Value Ref Range Status   Specimen Description BLOOD LEFT ARM  Final   Special Requests   Final    BOTTLES DRAWN AEROBIC AND ANAEROBIC 10CC ADDED ON 04/12/14 AT 1700   Culture   Final    NO GROWTH 7 DAYS Performed at [facility]    Report Status 04/20/2014 FINAL  Final  Culture, blood (routine x 2)     Status: None   Collection Time: 04/11/14  9:07 PM  Result Value Ref Range Status   Specimen Description BLOOD PORTA CATH  Final   Special Requests BOTTLES DRAWN AEROBIC ONLY 3CC  Final   Culture  Setup Time   Final    04/12/2014 01:15 Performed at [facility]    Culture   Final     NO GROWTH 5 DAYS Performed at [facility]    Report Status 04/18/2014 FINAL  Final  AFB culture with smear     Status: None (Preliminary result)   Collection Time: 04/14/14 12:01 PM  Result Value Ref Range Status   Specimen Description BRONCHIAL ALVEOLAR LAVAGE  Final   Special Requests Immunocompromised  Final   Acid Fast Smear   Final    NO ACID FAST BACILLI SEEN Performed at [facility]    Culture   Final    CULTURE WILL BE EXAMINED FOR 6 WEEKS BEFORE ISSUING A FINAL REPORT Performed at [facility]    Report Status PENDING  Incomplete  Culture, bal-quantitative     Status: None   Collection Time: 04/14/14 12:01 PM  Result Value Ref Range Status   Specimen Description BRONCHIAL ALVEOLAR LAVAGE  Final   Special Requests Immunocompromised  Final   Gram Stain   Final    FEW WBC PRESENT, PREDOMINANTLY PMN RARE SQUAMOUS EPITHELIAL CELLS PRESENT RARE GRAM POSITIVE COCCI IN PAIRS Performed at [place]   Final    >=100,000 COLONIES/ML Performed at [facility]    Culture   Final    Non-Pathogenic Oropharyngeal-type Flora Isolated. Performed at [facility]    Report Status 04/16/2014 FINAL  Final  Fungus Culture with Smear     Status: None (Preliminary result)   Collection Time: 04/14/14 12:01 PM  Result Value Ref Range Status   Specimen Description BRONCHIAL ALVEOLAR LAVAGE  Final   Special Requests Immunocompromised  Final   Fungal Smear   Final    NO YEAST OR FUNGAL ELEMENTS SEEN Performed at [facility]    Culture   Final    CULTURE IN PROGRESS FOR FOUR WEEKS Performed at [facility]    Report Status PENDING  Incomplete  MRSA PCR Screening     Status: None   Collection Time: 05/06/14 12:19 AM  Result Value Ref Range Status   MRSA by PCR NEGATIVE NEGATIVE Final    Medical/Surgical History: Past Medical History  Diagnosis Date  . Diabetes mellitus   . Hypertension    . Myocardial infarction 1985; 1990  . Angina   .  Coronary artery disease   . Dysrhythmia   . DVT (deep venous thrombosis) ~ 12/2010    LLE  . Anemia   . Blood transfusion 06/1999    post heart transplant  . Peripheral vascular disease   . CHF (congestive heart failure) 2001    beffore transplant  . Renal failure     Hemodialysis MWF last 2 years, sse dr [name] nephrology, goes to [facility] kidney center   Past Surgical History  Procedure Laterality Date  . Nephrectomy transplanted organ  2008  . Av fistula repair      rt. a=fore arm fistula  . Av fistula placement  ~ 01/2010    "this was my 2nd fistula placement"  . Heart transplant  06/1999  . Coronary angioplasty with stent placement  1985; 1990  . Colonoscopy  03/17/2012    Procedure: COLONOSCOPY;  Surgeon: Lear Ng, MD;  Location: [facility];  Service: Endoscopy;  Laterality: N/A;  . Coronary artery bypass graft  1990    CABG X3  . Colonoscopy with propofol N/A 12/14/2013    Procedure: COLONOSCOPY WITH PROPOFOL;  Surgeon: Lear Ng, MD;  Location: [facility];  Service: Endoscopy;  Laterality: N/A;  . Video bronchoscopy Bilateral 04/14/2014    Procedure: VIDEO BRONCHOSCOPY WITH FLUORO;  Surgeon: Collene Gobble, MD;  Location: [place];  Service: Cardiopulmonary;  Laterality: Bilateral;    Current Medication[s] Include: Medication PTA: Prescriptions prior to admission  Medication Sig Dispense Refill Last Dose  . aspirin EC 81 MG tablet Take 81 mg by mouth daily.     05/05/2014 at Unknown time  . atorvastatin (LIPITOR) 40 MG tablet Take 40 mg by mouth daily.     05/05/2014 at Unknown time  . azaTHIOprine (IMURAN) 50 MG tablet Take 75 mg by mouth every morning.    05/05/2014 at Unknown time  . b complex-vitamin c-folic acid (NEPHRO-VITE) 0.8 MG TABS tablet Take 1 tablet by mouth at bedtime.   05/05/2014 at Unknown time  . calcium acetate (PHOSLO) 667 MG capsule Take 667 mg by mouth 3 (three)  times daily with meals.    05/05/2014 at Unknown time  . carvedilol (COREG) 6.25 MG tablet Take 1 tablet (6.25 mg total) by mouth at bedtime. 30 tablet 0 05/04/2014 at 700 pm  . colesevelam (WELCHOL) 625 MG tablet Take 3,750 mg by mouth daily. Takes 6 tabs daily   05/05/2014 at Unknown time  . famotidine (PEPCID AC) 10 MG chewable tablet Chew 10 mg by mouth at bedtime.    05/04/2014 at Unknown time  . HUMALOG KWIKPEN 100 UNIT/ML SOPN Inject 6-10 Units into the skin 2 (two) times daily. Per sliding scale   05/05/2014 at Unknown time  . insulin glargine (LANTUS) 100 UNIT/ML injection Inject 20 Units into the skin every morning.   05/05/2014 at Unknown time  . isosorbide dinitrate (ISORDIL) 40 MG tablet Take 40 mg by mouth 3 (three) times daily.     05/05/2014 at Unknown time  . predniSONE (DELTASONE) 10 MG tablet Take 1 tablet (10 mg total) by mouth daily with breakfast. 30 tablet 0 05/04/2014 at Unknown time  . sirolimus (RAPAMUNE) 2 MG tablet Take 2 mg by mouth daily.   05/05/2014 at Unknown time     Scheduled:  Scheduled:  . aspirin EC  81 mg Oral Daily  . atorvastatin  40 mg Oral Daily  . azaTHIOprine  75 mg Oral q morning - 10a  . calcium acetate  667 mg  Oral TID WC  . carvedilol  6.25 mg Oral QHS  . ceFEPIme (MAXIPIME) 2 GM IVP  2 g Intravenous Q M,W,F-2000  . colesevelam  1,875 mg Oral BID WC  . darbepoetin (ARANESP) injection - DIALYSIS  200 mcg Intravenous Q Mon-HD  . doxercalciferol  1 mcg Intravenous Q M,W,F-HD  . famotidine  10 mg Oral QHS  . insulin aspart  0-9 Units Subcutaneous TID WC  . isosorbide dinitrate  40 mg Oral TID WC  . multivitamin  1 tablet Oral QHS  . pentafluoroprop-tetrafluoroeth      . predniSONE  10 mg Oral Q breakfast  . sirolimus  2 mg Oral Daily  . vancomycin (VANCOCIN) 750 mg IVPB (Hemodialysis)  750 mg Intravenous Q M,W,F-HD    Infusion[s]: Infusions:  . heparin 950 Units/hr (05/09/14 0903)    Antibiotic[s]: Anti-infectives    Start     Dose/Rate  Route Frequency Ordered Stop   05/09/14 1200  vancomycin (VANCOCIN) 750 mg in sodium chloride 0.9 % 150 mL IVPB     750 mg150 mL/hr over 60 Minutes Intravenous Every M-W-F (Hemodialysis) 05/06/14 1639     05/06/14 2000  ceFEPIme (MAXIPIME) 2 g in dextrose 5 % 50 mL IVPB     2 g100 mL/hr over 30 Minutes Intravenous Every M-W-F (2000) 05/06/14 1641     05/06/14 1645  vancomycin (VANCOCIN) 1,250 mg in sodium chloride 0.9 % 250 mL IVPB     1,250 mg166.7 mL/hr over 90 Minutes Intravenous  Once 05/06/14 1639 05/06/14 1901     Assessment: Day # 4 vancomycin and cefepime started for fever of unknown origin in this 68 y.o male with hx of cardiac transplant 2001. Extensive workup including blood cultures no growth to date, and bronchial with negative AFB and fungal smears so far. Question multifocal pneumonia on CT scan. Further workup showed a right IJ DVT and also has significant swelling of his right arm. Fevers suspected due to either ongoing pneumonia or septic thrombophlebitis  Patient is undergoing hemodialysis now.  Vanc and Cefepime due today at end of HD.  Previously dialyzed on 12/11, received Vanc 1250mg  & cefepime 2gm IV after HD on 12/11.     Goal of Therapy:   Pre-Dialysis level 15 - 25 mcg/ml Cefepime selected for infection and adjusted for renal function and dialysis schedule.   Plan: Continue vancomycin 750 mg IV qHD on MWF and cefepime 2 gm IV qHD MWF.   Follow up HD schedule, cultures, Levels, clinical course and adjust as clinically indicated.   Nicole Cella, RPh Clinical Pharmacist Pager: [phone]   12/14/201510:15 AM

## 2014-05-10 ENCOUNTER — Encounter (HOSPITAL_COMMUNITY): Payer: Self-pay | Admitting: Vascular Surgery

## 2014-05-10 DIAGNOSIS — E1121 Type 2 diabetes mellitus with diabetic nephropathy: Secondary | ICD-10-CM

## 2014-05-10 LAB — GLUCOSE, CAPILLARY
GLUCOSE-CAPILLARY: 180 mg/dL — AB (ref 70–99)
GLUCOSE-CAPILLARY: 204 mg/dL — AB (ref 70–99)
Glucose-Capillary: 98 mg/dL (ref 70–99)
Glucose-Capillary: 249 mg/dL — ABNORMAL HIGH (ref 70–99)

## 2014-05-10 LAB — CBC
HCT: 26.2 % — ABNORMAL LOW (ref 39.0–52.0)
Hemoglobin: 8.1 g/dL — ABNORMAL LOW (ref 13.0–17.0)
MCH: 28.5 pg (ref 26.0–34.0)
MCHC: 30.9 g/dL (ref 30.0–36.0)
MCV: 92.3 fL (ref 78.0–100.0)
PLATELETS: 118 10*3/uL — AB (ref 150–400)
RBC: 2.84 MIL/uL — AB (ref 4.22–5.81)
RDW: 16.6 % — AB (ref 11.5–15.5)
WBC: 3.3 10*3/uL — ABNORMAL LOW (ref 4.0–10.5)

## 2014-05-10 LAB — HEPARIN LEVEL (UNFRACTIONATED)
Heparin Unfractionated: 0.41 IU/mL (ref 0.30–0.70)
Heparin Unfractionated: 0.65 IU/mL (ref 0.30–0.70)

## 2014-05-10 LAB — HEPATITIS B SURFACE ANTIGEN: HEP B S AG: NEGATIVE

## 2014-05-10 MED ORDER — WARFARIN VIDEO: Freq: Once | Status: DC

## 2014-05-10 MED ORDER — WARFARIN SODIUM 5 MG PO TABS
5.0000 mg | ORAL_TABLET | Freq: Once | ORAL | Status: AC
  Administered 2014-05-10: 5 mg via ORAL
  Filled 2014-05-10: qty 1

## 2014-05-10 MED ORDER — COUMADIN BOOK
Freq: Once | Status: AC
Start: 2014-05-10 — End: 2014-05-10
  Administered 2014-05-10: 13:00:00
  Filled 2014-05-10: qty 1

## 2014-05-10 MED ORDER — INSULIN GLARGINE 100 UNIT/ML ~~LOC~~ SOLN
10.0000 [IU] | Freq: Every morning | SUBCUTANEOUS | Status: DC
  Administered 2014-05-10 – 2014-05-12 (×3): 10 [IU] via SUBCUTANEOUS
  Filled 2014-05-10 (×3): qty 0.1

## 2014-05-10 MED ORDER — COUMADIN BOOK
Freq: Once | Status: AC
Start: 2014-05-10 — End: 2014-05-10
  Administered 2014-05-10: 08:00:00
  Filled 2014-05-10: qty 1

## 2014-05-10 MED ORDER — WARFARIN - PHARMACIST DOSING INPATIENT
Freq: Every day | Status: DC
  Administered 2014-05-10: 18:00:00

## 2014-05-10 MED ORDER — ENOXAPARIN SODIUM 40 MG/0.4ML ~~LOC~~ SOLN: 70.0000 mg | SUBCUTANEOUS | Status: DC

## 2014-05-10 NOTE — Progress Notes (Signed)
ANTICOAGULATION CONSULT NOTE - Initial Consult  Pharmacy Consult for Warfarin  Indication: DVT, right IJ  Allergies  Allergen Reactions  . Lisinopril Swelling    Lips and tongue swell  . Niacin And Related Other (See Comments)    unknown  . Norvasc [Amlodipine Besylate] Rash    Flushing  . Penicillins Rash    Patient Measurements: Height: 6' (182.9 cm) Weight: 157 lb 6.4 oz (71.396 kg) IBW/kg (Calculated) : 77.6  Vital Signs: Temp: 98.9 F (37.2 C) (12/15 0500) Temp Source: Oral (12/15 0500) BP: 145/53 mmHg (12/15 0500) Pulse Rate: 84 (12/15 0500)  Labs:  Recent Labs  05/08/14 0500 05/09/14 0432 05/09/14 0500 05/09/14 0644 05/09/14 1624 05/10/14 0534 05/10/14 0539  HGB 7.6*  --  7.5*  --   --  8.1*  --   HCT 24.5*  --  24.2*  --   --  26.2*  --   PLT 96*  --  100*  --   --  118*  --   HEPARINUNFRC 0.60  --  0.78*  --  1.13*  --  0.41  CREATININE  --  6.88*  --  6.93*  --   --   --     Estimated Creatinine Clearance: 10.2 mL/min (by C-G formula based on Cr of 6.93).   Medical History: Past Medical History  Diagnosis Date  . Diabetes mellitus   . Hypertension   . Myocardial infarction 1985; 1990  . Angina   . Coronary artery disease   . Dysrhythmia   . DVT (deep venous thrombosis) ~ 12/2010    LLE  . Anemia   . Blood transfusion 06/1999    post heart transplant  . Peripheral vascular disease   . CHF (congestive heart failure) 2001    beffore transplant  . Renal failure     Hemodialysis MWF last 2 years, sse dr [name] nephrology, goes to [facility] kidney center   Assessment: Already on heparin for right IJ DVT, starting warfarin today. Baseline INR is 1.13, Hgb/Plts low and stable for several days, other labs as above.   Goal of Therapy:  INR 2-3 Monitor platelets by anticoagulation protocol: Yes   Plan:  -Warfarin 5mg  PO x 1 tonight -Daily PT/INR -Monitor for bleeding  Narda Bonds 05/10/2014,7:57 AM

## 2014-05-10 NOTE — Progress Notes (Signed)
ANTICOAGULATION CONSULT NOTE - Initial Consult  Pharmacy Consult for Warfarin  Indication: DVT, right IJ  Allergies  Allergen Reactions  . Lisinopril Swelling    Lips and tongue swell  . Niacin And Related Other (See Comments)    unknown  . Norvasc [Amlodipine Besylate] Rash    Flushing  . Penicillins Rash    Patient Measurements: Height: 6' (182.9 cm) Weight: 157 lb 6.4 oz (71.396 kg) IBW/kg (Calculated) : 77.6  Vital Signs: Temp: 98.7 F (37.1 C) (12/15 0935) Temp Source: Oral (12/15 0935) BP: 153/65 mmHg (12/15 0935) Pulse Rate: 84 (12/15 0935)  Labs:  Recent Labs  05/08/14 0500 05/09/14 0432 05/09/14 0500 05/09/14 0644 05/09/14 1624 05/10/14 0534 05/10/14 0539 05/10/14 1414  HGB 7.6*  --  7.5*  --   --  8.1*  --   --   HCT 24.5*  --  24.2*  --   --  26.2*  --   --   PLT 96*  --  100*  --   --  118*  --   --   HEPARINUNFRC 0.60  --  0.78*  --  1.13*  --  0.41 0.65  CREATININE  --  6.88*  --  6.93*  --   --   --   --     Estimated Creatinine Clearance: 10.2 mL/min (by C-G formula based on Cr of 6.93).   Medical History: Past Medical History  Diagnosis Date  . Diabetes mellitus   . Hypertension   . Myocardial infarction 1985; 1990  . Angina   . Coronary artery disease   . Dysrhythmia   . DVT (deep venous thrombosis) ~ 12/2010    LLE  . Anemia   . Blood transfusion 06/1999    post heart transplant  . Peripheral vascular disease   . CHF (congestive heart failure) 2001    beffore transplant  . Renal failure     Hemodialysis MWF last 2 years, sse dr [name] nephrology, goes to [facility] kidney [facility]   Assessment: Already on heparin for right IJ DVT, starting warfarin today. Baseline INR is 1.13, hgb/plts low and stable for several days, other labs as above. Heparin level therapeutic.  Goal of Therapy:  INR 2-3 Monitor platelets by anticoagulation protocol: Yes   Plan:  -Warfarin 5mg  PO x 1 tonight -Daily PT/INR -Monitor for  bleeding -Continue heparin at 950 units/hr  -Daily HL, CBC  Evamae Rowen, Jake Church 05/10/2014,3:24 PM

## 2014-05-10 NOTE — Progress Notes (Signed)
PATIENT DETAILS Name: Brandon Sharp Age: 68 y.o. Sex: male Date of Birth: 07-10-1945 Admit Date: 05/05/2014 Admitting Physician Orvan Falconer, MD TKZ:[id number] C, MD  Subjective: Denies any major complaints-anxious to go home. Has been on coumadin before,tells me he will be able to inject Lovenox  Assessment/Plan: Principal Problem:   Fever of unknown origin (FUO):ongoing for 4-6 weeks-has had 2 recent hospitalizations for the same issue, so far extensive workup including blood cultures, urine cultures, bronchial with negative AFB and fungal smears so far, question multifocal pneumonia on CT scan. Further workup has demonstrated a right IJ DVT and also has significant swelling of his right arm. Fevers either from ongoing pneumonia or septic thrombophlebitis. Infectious disease following, remains on empiric antibiotics as outlined below. He remains on IV heparin for right IJ DVT.  Active Problems:  Right IJ DVT: Currently on IV heparin, given swelling of his right upper extremity-seen by vascular surgery, underwent for a fistulogram of his right upper extremity with venoplasty of right subclavian vein and right innominate vein on 05/09/14. Some decrease in the right arm swelling. Start Coumadin, have asked CM to see if insurance will cover Lovenox-if so-will start Lovenox and begin Lovenox teaching. Interestingly he has had a hx of prior DVT, suspect he may need life long anticoagulation  Heart transplant, failed kidney transplant: Continue home immunosuppressants. Follow with transplant clinic post discharge.  Anemia:suspect secondary to acute illness on chronic anemia. Follow CBC and transfuse prn.  Thrombocytopenia:suspet secondary to acute illness-watch closely as on Heparin, if drops significantly will send out HIT panel.Platelets low but slowly increasing.   ESRD: On Monday Wednesday Friday schedule. Renal consulted.   DM (diabetes mellitus), type 2 with renal  complications: CBGs stable, continue with SSI.Restart low dose Lantus   Hypertension: Continue with Coreg, Imdur   Dyslipidemia: Continue with statin   Protein-calorie malnutrition, severe: Continue with supplements.  Disposition: Remain inpatient-Home 12/16   Antibiotics: See below   Anti-infectives    Start     Dose/Rate Route Frequency Ordered Stop   05/09/14 1200  vancomycin (VANCOCIN) 750 mg in sodium chloride 0.9 % 150 mL IVPB     750 mg150 mL/hr over 60 Minutes Intravenous Every M-W-F (Hemodialysis) 05/06/14 1639     05/06/14 2000  ceFEPIme (MAXIPIME) 2 g in dextrose 5 % 50 mL IVPB     2 g100 mL/hr over 30 Minutes Intravenous Every M-W-F (2000) 05/06/14 1641     05/06/14 1645  vancomycin (VANCOCIN) 1,250 mg in sodium chloride 0.9 % 250 mL IVPB     1,250 mg166.7 mL/hr over 90 Minutes Intravenous  Once 05/06/14 1639 05/06/14 1901      DVT Prophylaxis: IV Heparin  Code Status: Full code   Family Communication None at bedside  Procedures:  None  CONSULTS:  ID, nephrology and vascular surgery  MEDICATIONS: Scheduled Meds: . aspirin EC  81 mg Oral Daily  . atorvastatin  40 mg Oral Daily  . azaTHIOprine  75 mg Oral q morning - 10a  . calcium acetate  667 mg Oral TID WC  . carvedilol  6.25 mg Oral QHS  . ceFEPIme (MAXIPIME) 2 GM IVP  2 g Intravenous Q M,W,F-2000  . colesevelam  1,875 mg Oral BID WC  . coumadin book   Does not apply Once  . darbepoetin (ARANESP) injection - DIALYSIS  200 mcg Intravenous Q Mon-HD  . doxercalciferol  1 mcg Intravenous Q M,W,F-HD  . famotidine  10  mg Oral QHS  . insulin aspart  0-9 Units Subcutaneous TID WC  . isosorbide dinitrate  40 mg Oral TID WC  . multivitamin  1 tablet Oral QHS  . predniSONE  10 mg Oral Q breakfast  . sirolimus  2 mg Oral Daily  . sodium chloride  3 mL Intravenous Q12H  . vancomycin (VANCOCIN) 750 mg IVPB (Hemodialysis)  750 mg Intravenous Q M,W,F-HD  . warfarin  5 mg Oral ONCE-1800  . warfarin   Does  not apply Once  . Warfarin - Pharmacist Dosing Inpatient   Does not apply q1800   Continuous Infusions: . heparin 950 Units/hr (05/10/14 0440)   PRN Meds:.sodium chloride, [DISCONTINUED] acetaminophen **OR** acetaminophen, acetaminophen, ondansetron (ZOFRAN) IV, sodium chloride    PHYSICAL EXAM: Vital signs in last 24 hours: Filed Vitals:   05/09/14 1724 05/09/14 2045 05/10/14 0500 05/10/14 0935  BP: 135/54 137/75 145/53 153/65  Pulse: 150 92 84 84  Temp: 98.5 F (36.9 C) 97.9 F (36.6 C) 98.9 F (37.2 C) 98.7 F (37.1 C)  TempSrc: Oral Oral Oral Oral  Resp: 22 20 20 20   Height:  6' (1.829 m)    Weight:  71.396 kg (157 lb 6.4 oz)    SpO2: 90% 94% 93% 95%    Weight change: -4.2 kg (-9 lb 4.2 oz) Filed Weights   05/09/14 0621 05/09/14 1000 05/09/14 2045  Weight: 73.7 kg (162 lb 7.7 oz) 70.7 kg (155 lb 13.8 oz) 71.396 kg (157 lb 6.4 oz)   Body mass index is 21.34 kg/(m^2).   Gen Exam: Awake and alert with clear speech.   Neck: Supple, No JVD.   Chest: B/L Clear. No rales or rhonchi  CVS: S1 S2 Regular, no murmurs.  Abdomen: soft, BS +, non tender, non distended.  Extremities: no edema, lower extremities warm to touch.RUE swelling Neurologic: Non Focal.   Skin: No Rash.   Wounds: N/A.    Intake/Output from previous day:  Intake/Output Summary (Last 24 hours) at 05/10/14 1217 Last data filed at 05/10/14 0900  Gross per 24 hour  Intake    240 ml  Output      0 ml  Net    240 ml     LAB RESULTS: CBC  Recent Labs Lab 05/05/14 1726 05/06/14 1830 05/07/14 0442 05/08/14 0500 05/09/14 0500 05/10/14 0534  WBC 4.5 4.8 4.0 2.8* 3.0* 3.3*  HGB 9.1* 8.8* 8.4* 7.6* 7.5* 8.1*  HCT 29.8* 28.0* 26.9* 24.5* 24.2* 26.2*  PLT 121* 110* 99* 96* 100* 118*  MCV 92.5 93.6 92.4 91.4 90.6 92.3  MCH 28.3 29.4 28.9 28.4 28.1 28.5  MCHC 30.5 31.4 31.2 31.0 31.0 30.9  RDW 16.5* 16.6* 16.6* 16.5* 16.3* 16.6*  LYMPHSABS 1.4  --   --   --   --   --   MONOABS 0.3  --   --   --    --   --   EOSABS 0.1  --   --   --   --   --   BASOSABS 0.0  --   --   --   --   --     Chemistries   Recent Labs Lab 05/05/14 1726 05/06/14 1205 05/09/14 0432 05/09/14 0644  NA 137 135* 137 135*  K 2.9* 3.9 4.2 4.2  CL 93* 94* 99 98  CO2 30 26 22 22   GLUCOSE 143* 169* 142* 141*  BUN 17 24* 39* 40*  CREATININE 4.92* 6.60* 6.88* 6.93*  CALCIUM  8.6 8.5 8.6 8.7    CBG:  Recent Labs Lab 05/08/14 2109 05/09/14 1104 05/09/14 1722 05/09/14 2043 05/10/14 0838  GLUCAP 145* 110* 166* 178* 98    GFR Estimated Creatinine Clearance: 10.2 mL/min (by C-G formula based on Cr of 6.93).  Coagulation profile  Recent Labs Lab 05/06/14 1830  INR 1.13    Cardiac Enzymes No results for input(s): CKMB, TROPONINI, MYOGLOBIN in the last 168 hours.  Invalid input(s): CK  Invalid input(s): POCBNP No results for input(s): DDIMER in the last 72 hours. No results for input(s): HGBA1C in the last 72 hours. No results for input(s): CHOL, HDL, LDLCALC, TRIG, CHOLHDL, LDLDIRECT in the last 72 hours. No results for input(s): TSH, T4TOTAL, T3FREE, THYROIDAB in the last 72 hours.  Invalid input(s): FREET3 No results for input(s): VITAMINB12, FOLATE, FERRITIN, TIBC, IRON, RETICCTPCT in the last 72 hours. No results for input(s): LIPASE, AMYLASE in the last 72 hours.  Urine Studies No results for input(s): UHGB, CRYS in the last 72 hours.  Invalid input(s): UACOL, UAPR, USPG, UPH, UTP, UGL, UKET, UBIL, UNIT, UROB, ULEU, UEPI, UWBC, URBC, UBAC, CAST, UCOM, BILUA  MICROBIOLOGY: Recent Results (from the past 240 hour(s))  Culture, blood (routine x 2)     Status: None (Preliminary result)   Collection Time: 05/05/14  8:44 PM  Result Value Ref Range Status   Specimen Description BLOOD ARM LEFT  Final   Special Requests BOTTLES DRAWN AEROBIC AND ANAEROBIC 10CC  Final   Culture  Setup Time   Final    05/06/2014 00:38 Performed at [facility]    Culture   Final           BLOOD  CULTURE RECEIVED NO GROWTH TO DATE CULTURE WILL BE HELD FOR 5 DAYS BEFORE ISSUING A FINAL NEGATIVE REPORT Performed at [facility]    Report Status PENDING  Incomplete  Culture, blood (routine x 2)     Status: None (Preliminary result)   Collection Time: 05/05/14  8:51 PM  Result Value Ref Range Status   Specimen Description BLOOD WRIST LEFT  Final   Special Requests BOTTLES DRAWN AEROBIC AND ANAEROBIC 5CC  Final   Culture  Setup Time   Final    05/06/2014 00:39 Performed at [facility]    Culture   Final           BLOOD CULTURE RECEIVED NO GROWTH TO DATE CULTURE WILL BE HELD FOR 5 DAYS BEFORE ISSUING A FINAL NEGATIVE REPORT Performed at [facility]    Report Status PENDING  Incomplete  MRSA PCR Screening     Status: None   Collection Time: 05/06/14 12:19 AM  Result Value Ref Range Status   MRSA by PCR NEGATIVE NEGATIVE Final    Comment:        The GeneXpert MRSA Assay (FDA approved for NASAL specimens only), is one component of a comprehensive MRSA colonization surveillance program. It is not intended to diagnose MRSA infection nor to guide or monitor treatment for MRSA infections.   Culture, blood (routine x 2)     Status: None (Preliminary result)   Collection Time: 05/06/14  4:50 PM  Result Value Ref Range Status   Specimen Description BLOOD LEFT ARM  Final   Special Requests BOTTLES DRAWN AEROBIC AND ANAEROBIC 10CC  Final   Culture  Setup Time   Final    05/06/2014 22:41 Performed at [facility]   Final  BLOOD CULTURE RECEIVED NO GROWTH TO DATE CULTURE WILL BE HELD FOR 5 DAYS BEFORE ISSUING A FINAL NEGATIVE REPORT Performed at [facility]    Report Status PENDING  Incomplete    RADIOLOGY STUDIES/RESULTS: Dg Chest 2 View  05/05/2014   CLINICAL DATA:  Fever of 102. Vomiting started today. Cough for 3 months. History of heart transplant in 2001. Diabetes, hypertension, CHF. Dialysis. CABG x3.  EXAM:  CHEST  2 VIEW  COMPARISON:  04/14/2014  FINDINGS: Status post median sternotomy. Heart is enlarged. Stents are identified in the upper chest. There has been some improvement in aeration of the lung bases. Patchy densities persist however. No definite pleural effusions. No pulmonary edema.  IMPRESSION: Cardiomegaly. Persistent bilateral airspace filling opacities with some improvement in aeration since prior studies.   Electronically Signed   By: Shon Hale M.D.   On: 05/05/2014 21:00   Ct Chest Wo Contrast  04/13/2014   CLINICAL DATA:  68 year old immunocompromised male with intermittent fever. Initial encounter. Current history of renal failure status post renal transplant and transplant project.  EXAM: CT CHEST WITHOUT CONTRAST  TECHNIQUE: Multidetector CT imaging of the chest was performed following the standard protocol without IV contrast.  COMPARISON:  Chest radiograph 04/11/2014 and earlier. Chest CT 04/07/2014  FINDINGS: Improved lung volumes. Decreased dependent atelectasis in both lungs, however, confluent peribronchovascular opacity persists in both lower lobes in the lingula, with central air bronchograms. See series 3. The right middle lobe is spared. The right upper lobe is spared. The superior aspect of the left upper lobe is spared.  Only trace associated left pleural fluid. No pericardial effusion. Stable cardiomegaly. No hilar or mediastinal lymphadenopathy. No axillary lymphadenopathy.  Gynecomastia. Stable visualized upper abdominal viscera. Aortic ectasia and calcified atherosclerosis re- identified. Coronary artery stents and atherosclerosis. Sequelae of median sternotomy. Right subclavian venous vascular stent re- identified.  No acute osseous abnormality identified.  IMPRESSION: 1. Improved lung volumes with resolved dependent atelectasis, but persistent confluent bilateral lower lobe and lingula peribronchovascular consolidation. Favor bronchopneumonia in this setting. Noninfectious  considerations such as post transplant lymphoproliferative disorder are felt less likely. 2. Trace left pleural fluid. 3. Otherwise stable chest and upper abdominal viscera compared to 04/07/2014.   Electronically Signed   By: Lars Pinks M.D.   On: 04/13/2014 06:00   Ct Chest W Contrast  05/06/2014   CLINICAL DATA:  68 year old male with fever of unknown origin  EXAM: CT CHEST, ABDOMEN, AND PELVIS WITH CONTRAST  TECHNIQUE: Multidetector CT imaging of the chest, abdomen and pelvis was performed following the standard protocol during bolus administration of intravenous contrast.  CONTRAST:  29mL OMNIPAQUE IOHEXOL 300 MG/ML  SOLN  COMPARISON:  04/13/2014  FINDINGS: CT CHEST FINDINGS  Heart/mediastinum: Median sternotomy wires are present. The heart is mildly enlarged. Scattered atherosclerotic aortic and coronary artery calcifications are present. There is no pericardial effusion. There is mild aneurysmal dilatation of the ascending aorta measured 3.9 cm. There is no aortic dissection. Several collateral vessels are noted within the soft tissues of the left lateral chest. An unchanged 7 mm left upper lobe pulmonary nodule is present (series 2, image 32).  There are no pathologically axillary lymph nodes.  Chest: Upper lobe predominant emphysematous changes are present. Similar appearing bibasilar and linear patchy areas of airspace consolidation is noted. Interstitial thickening is also present. Small bilateral pleural effusions are present. There is no pneumothorax upper lobe predominant centrilobular and paraseptal emphysematous changes are present. No suspicious pulmonary nodules or masses  are identified. A few scattered calcified pulmonary nodules are noted.  Gynecomastia is noted.  CT ABDOMEN AND PELVIS FINDINGS  Liver: A 2.2 cm right and left hepatic lobe cysts are noted. Additional subcentimeter hypodense renal lesions are too small to fully characterize.  Gallbladder: There is no cholelithiasis or biliary  sludge. There is no pericholecystic fluid. There is no abnormal gallbladder wall thickening. There is no intrahepatic or extrahepatic biliary ductal dilatation.  Spleen: Unremarkable.  Pancreas: Unremarkable.  Adrenal glands: Unremarkable.  Kidneys: Multiple hypodense lesions of varying sizes are seen noted throughout the kidneys, compatible with cysts. Renal cortical thinning is also present bilaterally. There is no hydronephrosis. Several tiny nonobstructing calcific densities are noted bilaterally. A right lower quadrant renal transplant is present.  Bowel/gastrointestinal tract: There is colonic diverticulosis without definite evidence for acute diverticulitis. There is no abnormal bowel wall thickening. There is no evidence of bowel obstruction  Pelvis: There is no pelvic mass. The urinary bladder is decompressed. The prostate gland is mildly enlarged and measures approximately 5.1 x 2.5 cm.  Miscellaneous: Extensive vascular calcifications are present. There is mild ectasia of the abdominal aorta.  Osseous structures: Multilevel degenerative changes of the spine are present. Degenerative changes of the hips are also noted. No acute osseous abnormality is identified.  IMPRESSION: 1. Findings remain concerning for multifocal pneumonia. 2. Colonic diverticulosis without definite evidence for acute diverticulitis. 3. Chronic medical renal disease. 4. Prostatic enlargement. 5. Aneurysmal dilatation of the ascending aorta. 6. Cardiomegaly. 7. 7 mm left upper lobe pulmonary nodule. If the patient is at high risk for bronchogenic carcinoma, follow-up chest CT at 3-45months is recommended. If the patient is at low risk for bronchogenic carcinoma, follow-up chest CT at 6-12 months is recommended. This recommendation follows the consensus statement: Guidelines for Management of Small Pulmonary Nodules Detected on CT Scans: A Statement from the [place] as published in Radiology 2005; 237:395-400. 8. Probable  superimposed mild interstitial edema and trace bilateral pleural effusions.   Electronically Signed   By: Rosemarie Ax   On: 05/06/2014 08:39   Mr Brain Wo Contrast  04/14/2014   CLINICAL DATA:  68 year old male with immunosuppression following organ transplantation presents with symptoms of fever, malaise, and encephalopathy. Initial encounter.  EXAM: MRI HEAD WITHOUT CONTRAST  TECHNIQUE: Multiplanar, multiecho pulse sequences of the brain and surrounding structures were obtained without intravenous contrast.  COMPARISON:  CT head 04/06/2014.  MR head 05/15/2003.  FINDINGS: There is a 1 cm sized area of diffusion restriction in the RIGHT occipital subcortical white matter with slight associated T2 and FLAIR prolongation. No other similar lesions. No acute hemorrhage is present, although is small focus chronic hemorrhage was noted in 2004 in the RIGHT posterior temporal subcortical white matter, with additional tiny foci of chronic hemorrhage in the RIGHT temporal and LEFT frontal cortex.  Generalized atrophy. Multiple BILATERAL chronic cerebellar infarcts. BILATERAL deep nuclei chronic lacunar infarcts. Minor changes of subcortical and periventricular white matter signal abnormality, likely chronic microvascular ischemic change.  Normal pituitary and cerebellar tonsils. Advanced cervical spondylosis with suspected slight cord compression.  Flow voids are maintained in the carotid, basilar, and vertebral arteries.  Abnormalities of the extracranial soft tissues include significant fluid accumulation with probable retention cyst formation the LEFT maxillary sinus causing expansion, and BILATERAL T2 hyperintense fluid in the mastoid air cells, favored to represent benign effusions. No sinus air-fluid level. Negative orbits. Extracranial soft tissues unremarkable.  No contrast was given in this patient renal failure, but there  are no obvious features of pachymeningeal thickening or sulcal FLAIR hyperintensity to  suggest meningitis.  IMPRESSION: Nonspecific 1 cm focus of restricted diffusion in the RIGHT occipital subcortical white matter. The patient has evidence for multiple remote areas of infarction and certainly this is a leading differential consideration.  In the setting of immunosuppression, with the patient on Imuran, neurotoxicity secondary to this drug is associated with abnormal diffusion signal in the posterior circulation, including occipital cortex and white matter. Similar findings were seen on prior MR from 2004, but were more extensive at that time.  Cerebritis or brain abscess, well also associated with restricted diffusion, is not favored based on lesion morphology and lack of surrounding vasogenic edema. Intracranial infection is not favored as a cause of the patient's fever. In addition, LEFT maxillary sinus disease and BILATERAL mastoid effusions are likely non-worrisome.   Electronically Signed   By: Rolla Flatten M.D.   On: 04/14/2014 15:44   Ct Abdomen Pelvis W Contrast  05/06/2014   CLINICAL DATA:  68 year old male with fever of unknown origin  EXAM: CT CHEST, ABDOMEN, AND PELVIS WITH CONTRAST  TECHNIQUE: Multidetector CT imaging of the chest, abdomen and pelvis was performed following the standard protocol during bolus administration of intravenous contrast.  CONTRAST:  85mL OMNIPAQUE IOHEXOL 300 MG/ML  SOLN  COMPARISON:  04/13/2014  FINDINGS: CT CHEST FINDINGS  Heart/mediastinum: Median sternotomy wires are present. The heart is mildly enlarged. Scattered atherosclerotic aortic and coronary artery calcifications are present. There is no pericardial effusion. There is mild aneurysmal dilatation of the ascending aorta measured 3.9 cm. There is no aortic dissection. Several collateral vessels are noted within the soft tissues of the left lateral chest. An unchanged 7 mm left upper lobe pulmonary nodule is present (series 2, image 32).  There are no pathologically axillary lymph nodes.  Chest:  Upper lobe predominant emphysematous changes are present. Similar appearing bibasilar and linear patchy areas of airspace consolidation is noted. Interstitial thickening is also present. Small bilateral pleural effusions are present. There is no pneumothorax upper lobe predominant centrilobular and paraseptal emphysematous changes are present. No suspicious pulmonary nodules or masses are identified. A few scattered calcified pulmonary nodules are noted.  Gynecomastia is noted.  CT ABDOMEN AND PELVIS FINDINGS  Liver: A 2.2 cm right and left hepatic lobe cysts are noted. Additional subcentimeter hypodense renal lesions are too small to fully characterize.  Gallbladder: There is no cholelithiasis or biliary sludge. There is no pericholecystic fluid. There is no abnormal gallbladder wall thickening. There is no intrahepatic or extrahepatic biliary ductal dilatation.  Spleen: Unremarkable.  Pancreas: Unremarkable.  Adrenal glands: Unremarkable.  Kidneys: Multiple hypodense lesions of varying sizes are seen noted throughout the kidneys, compatible with cysts. Renal cortical thinning is also present bilaterally. There is no hydronephrosis. Several tiny nonobstructing calcific densities are noted bilaterally. A right lower quadrant renal transplant is present.  Bowel/gastrointestinal tract: There is colonic diverticulosis without definite evidence for acute diverticulitis. There is no abnormal bowel wall thickening. There is no evidence of bowel obstruction  Pelvis: There is no pelvic mass. The urinary bladder is decompressed. The prostate gland is mildly enlarged and measures approximately 5.1 x 2.5 cm.  Miscellaneous: Extensive vascular calcifications are present. There is mild ectasia of the abdominal aorta.  Osseous structures: Multilevel degenerative changes of the spine are present. Degenerative changes of the hips are also noted. No acute osseous abnormality is identified.  IMPRESSION: 1. Findings remain concerning  for multifocal pneumonia. 2. Colonic diverticulosis  without definite evidence for acute diverticulitis. 3. Chronic medical renal disease. 4. Prostatic enlargement. 5. Aneurysmal dilatation of the ascending aorta. 6. Cardiomegaly. 7. 7 mm left upper lobe pulmonary nodule. If the patient is at high risk for bronchogenic carcinoma, follow-up chest CT at 3-74months is recommended. If the patient is at low risk for bronchogenic carcinoma, follow-up chest CT at 6-12 months is recommended. This recommendation follows the consensus statement: Guidelines for Management of Small Pulmonary Nodules Detected on CT Scans: A Statement from the [place] as published in Radiology 2005; 237:395-400. 8. Probable superimposed mild interstitial edema and trace bilateral pleural effusions.   Electronically Signed   By: Rosemarie Ax   On: 05/06/2014 08:39   Dg Chest Port 1 View  05/08/2014   CLINICAL DATA:  Oxygen desaturation.  EXAM: PORTABLE CHEST - 1 VIEW  COMPARISON:  May 05, 2014.  FINDINGS: Stable cardiomegaly. Sternotomy wires are again noted. Bilateral subclavian vascular stents are again noted. No pneumothorax is noted. Increased bibasilar opacities are noted concerning for worsening edema or pneumonia. Minimal bilateral pleural effusions are noted. Bony thorax is intact.  IMPRESSION: Increased bibasilar opacities are noted concerning for worsening edema or possibly pneumonia. Minimal bilateral pleural effusions are noted as well.   Electronically Signed   By: Sabino Dick M.D.   On: 05/08/2014 18:06   Dg Chest Port 1 View  04/14/2014   CLINICAL DATA:  Status post left bronchoscopy lung biopsy.  EXAM: PORTABLE CHEST - 1 VIEW  COMPARISON:  04/11/2014  FINDINGS: Persistent bibasilar pulmonary airspace opacity again seen, left side greater than right. No evidence of pneumothorax. Cardiomegaly is stable. Bilateral subclavian stents again seen in place. Patient has undergone previous median sternotomy.   IMPRESSION: No significant change in bibasilar airspace disease and cardiomegaly. No pneumothorax identified.   Electronically Signed   By: Earle Gell M.D.   On: 04/14/2014 12:32   Dg Chest Port 1 View  04/11/2014   CLINICAL DATA:  Fever and chills.  EXAM: PORTABLE CHEST - 1 VIEW  COMPARISON:  Chest CT, 04/07/2014.  Chest radiographs, 04/06/2014.  FINDINGS: Patchy areas of left mid and lower lung consolidation and milder right lung base consolidation, this similar to the prior studies allowing for differences in technique and inspiratory volumes. No new areas of lung consolidation. No convincing pulmonary edema. No pleural effusion or pneumothorax.  Changes from cardiac surgery are stable. Cardiac silhouette is mildly enlarged. No mediastinal or hilar masses. Stable right subclavian vein stent.  IMPRESSION: Persist consolidation in the left mid and lower lung and in the right lung base similar to the prior studies. This is most suggestive of multifocal pneumonia. No new abnormalities.   Electronically Signed   By: Lajean Manes M.D.   On: 04/11/2014 20:46   Dg C-arm Bronchoscopy  04/14/2014   CLINICAL DATA:    C-ARM BRONCHOSCOPY  Fluoroscopy was utilized by the requesting physician.  No radiographic  interpretation.     Oren Binet, MD  [facility] Pager:[phone]  If 7PM-7AM, please contact night-coverage [email or web address] Password [email or web address] 05/10/2014, 12:17 PM   LOS: 5 days

## 2014-05-10 NOTE — Progress Notes (Signed)
Subjective:  No complaints, no recent fever, hoping for discharge today  Objective: Vital signs in last 24 hours: Temp:  [97.7 F (36.5 C)-98.9 F (37.2 C)] 98.9 F (37.2 C) (12/15 0500) Pulse Rate:  [80-150] 84 (12/15 0500) Resp:  [18-22] 20 (12/15 0500) BP: (118-155)/(53-75) 145/53 mmHg (12/15 0500) SpO2:  [90 %-100 %] 93 % (12/15 0500) Weight:  [70.7 kg (155 lb 13.8 oz)-71.396 kg (157 lb 6.4 oz)] 71.396 kg (157 lb 6.4 oz) (12/14 2045) Weight change: -4.2 kg (-9 lb 4.2 oz)  Intake/Output from previous day: 12/14 0701 - 12/15 0700 In: -  Out: 3004  Intake/Output this shift:   Lab Results:  Recent Labs  05/09/14 0500 05/10/14 0534  WBC 3.0* 3.3*  HGB 7.5* 8.1*  HCT 24.2* 26.2*  PLT 100* 118*   BMET:  Recent Labs  05/09/14 0432 05/09/14 0644  NA 137 135*  K 4.2 4.2  CL 99 98  CO2 22 22  GLUCOSE 142* 141*  BUN 39* 40*  CREATININE 6.88* 6.93*  CALCIUM 8.6 8.7  ALBUMIN  --  2.1*   No results for input(s): PTH in the last 72 hours. Iron Studies: No results for input(s): IRON, TIBC, TRANSFERRIN, FERRITIN in the last 72 hours.  Studies/Results: No results found.  EXAM: General appearance: Alert, in no apparent distress Resp:  CTA without rales, rhonchi, or wheezes Cardio:  RRR without murmur or rub GI:  + BS, soft and nontender Extremities:  UE edema (R > L), no LE edema Access:  AVF @ RUA with + bruit  HD: MWF [facility] 71.5 kgs 2K/2.25ca 4hr 3000 Heparin R AVF 400/1.5  hectorol 1 mcg IV/HD Aranesp 60 q wends Venofer 100 x5- stop 12/11   Assessment/Plan: 1. Recurrent fevers - now afebrile, extensive workup during previous admissions, followed by ID, BCs no growth to date, CT concerning for PNA with LUL pulmonary nodule, RUA occlusive DVT; Vancomycin & Cefepime started 12/11.  Fevers resolved on IV abx.  ID suspecting possible septic thrombophlebitis as cause of fevers.   2. RUE DVT - Heparin gtt, oral anticoagulation, s/p R arm fistulogram with  venoplasty of R subclavian & R innominate veins per Dr. Bridgett Larsson yesterday. 3. ESRD - HD on MWF @ [facility],  4. HTN/Volume - BP 145/53 on Carvedilol; wt 71.4 kg, @ EDW. 5. Anemia - Hgb 8.1, Aranesp 200 mcg on Mon. 6. 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 7. HX heart transplant - on immunosuppressive therapy. 8. DM - per primary. 9. CAD - Hx MI, on Imdur, BB, ASA, statin 10. Dispo - poss dc tomorrow after HD if he can get home lovenox    LOS: 5 days   Brandon Sharp,Brandon Sharp 05/10/2014,7:38 AM  Pt seen, examined, agree w assess/plan as above with additions as indicated.  Kelly Splinter MD pager [phone]    cell [phone] 05/10/2014, 11:05 AM

## 2014-05-10 NOTE — Progress Notes (Signed)
   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   *  Vascular surgery will be available as needed.   SUBJECTIVE: No complaints  PHYSICAL EXAM: Filed Vitals:   05/09/14 1724 05/09/14 2045 05/10/14 0500 05/10/14 0935  BP: 135/54 137/75 145/53 153/65  Pulse: 150 92 84 84  Temp: 98.5 F (36.9 C) 97.9 F (36.6 C) 98.9 F (37.2 C) 98.7 F (37.1 C)  TempSrc: Oral Oral Oral Oral  Resp: 22 20 20 20   Height:  6' (1.829 m)    Weight:  157 lb 6.4 oz (71.396 kg)    SpO2: 90% 94% 93% 95%   AVF still somewhat pulsatile.   LABS: Lab Results  Component Value Date   WBC 3.3* 05/10/2014   HGB 8.1* 05/10/2014   HCT 26.2* 05/10/2014   MCV 92.3 05/10/2014   PLT 118* 05/10/2014   Lab Results  Component Value Date   CREATININE 6.93* 05/09/2014   Lab Results  Component Value Date   INR 1.13 05/06/2014   CBG (last 3)   Recent Labs  05/09/14 1722 05/09/14 2043 05/10/14 0838  GLUCAP 166* 178* 98    Principal Problem:   Fever of unknown origin (FUO) Active Problems:   Heart transplanted   DM (diabetes mellitus), type 2 with renal complications   ESRD on hemodialysis   Healthcare associated bacterial pneumonia   FUO (fever of unknown origin)   Protein-calorie malnutrition, severe   Gae Gallop Beeper: [phone] 05/10/2014

## 2014-05-10 NOTE — Care Management Note (Signed)
  CARE MANAGEMENT NOTE 05/10/2014  Patient:  Brandon, Sharp   Account Number:  [id number]  Date Initiated:  05/10/2014  Documentation initiated by:  Adelyne Marchese  Subjective/Objective Assessment:   CM following for progression and d/c planning.     Action/Plan:   05/10/2014 Met with pt and discussed cost of Lovenox, awaiting copay cost. Pt will return to home with [facility] for [place] followup.   Anticipated DC Date:  05/10/2014   Anticipated DC Plan:  [place]         [facility] Choice  HOME HEALTH   Choice offered to / List presented to:  C-1 Patient        [place] arranged  HH-1 RN  [place].   Status of service:  Completed, signed off Medicare Important Message given?  YES (If response is "NO", the following Medicare IM given date fields will be blank) Date Medicare IM given:  05/10/2014 Medicare IM given by:  Isiah Scheel Date Additional Medicare IM given:   Additional Medicare IM given by:    Discharge Disposition:    Per UR Regulation:    If discussed at Long Length of Stay Meetings, dates discussed:    Comments:

## 2014-05-10 NOTE — Progress Notes (Signed)
INFECTIOUS DISEASE PROGRESS NOTE  ID: Brandon Sharp is a 68 y.o. male with  Principal Problem:   Fever of unknown origin (FUO) Active Problems:   Heart transplanted   DM (diabetes mellitus), type 2 with renal complications   ESRD on hemodialysis   Healthcare associated bacterial pneumonia   FUO (fever of unknown origin)   Protein-calorie malnutrition, severe  Subjective: Without complaints  Abtx:  Anti-infectives    Start     Dose/Rate Route Frequency Ordered Stop   05/09/14 1200  vancomycin (VANCOCIN) 750 mg in sodium chloride 0.9 % 150 mL IVPB     750 mg150 mL/hr over 60 Minutes Intravenous Every M-W-F (Hemodialysis) 05/06/14 1639     05/06/14 2000  ceFEPIme (MAXIPIME) 2 g in dextrose 5 % 50 mL IVPB     2 g100 mL/hr over 30 Minutes Intravenous Every M-W-F (2000) 05/06/14 1641     05/06/14 1645  vancomycin (VANCOCIN) 1,250 mg in sodium chloride 0.9 % 250 mL IVPB     1,250 mg166.7 mL/hr over 90 Minutes Intravenous  Once 05/06/14 1639 05/06/14 1901      Medications:  Scheduled: . aspirin EC  81 mg Oral Daily  . atorvastatin  40 mg Oral Daily  . azaTHIOprine  75 mg Oral q morning - 10a  . calcium acetate  667 mg Oral TID WC  . carvedilol  6.25 mg Oral QHS  . ceFEPIme (MAXIPIME) 2 GM IVP  2 g Intravenous Q M,W,F-2000  . colesevelam  1,875 mg Oral BID WC  . darbepoetin (ARANESP) injection - DIALYSIS  200 mcg Intravenous Q Mon-HD  . doxercalciferol  1 mcg Intravenous Q M,W,F-HD  . famotidine  10 mg Oral QHS  . insulin aspart  0-9 Units Subcutaneous TID WC  . insulin glargine  10 Units Subcutaneous q morning - 10a  . isosorbide dinitrate  40 mg Oral TID WC  . multivitamin  1 tablet Oral QHS  . predniSONE  10 mg Oral Q breakfast  . sirolimus  2 mg Oral Daily  . sodium chloride  3 mL Intravenous Q12H  . vancomycin (VANCOCIN) 750 mg IVPB (Hemodialysis)  750 mg Intravenous Q M,W,F-HD  . warfarin  5 mg Oral ONCE-1800  . warfarin   Does not apply Once  . Warfarin -  Pharmacist Dosing Inpatient   Does not apply q1800    Objective: Vital signs in last 24 hours: Temp:  [97.9 F (36.6 C)-98.9 F (37.2 C)] 98.7 F (37.1 C) (12/15 0935) Pulse Rate:  [84-150] 84 (12/15 0935) Resp:  [20-22] 20 (12/15 0935) BP: (135-153)/(53-75) 153/65 mmHg (12/15 0935) SpO2:  [90 %-95 %] 95 % (12/15 0935) Weight:  [71.396 kg (157 lb 6.4 oz)] 71.396 kg (157 lb 6.4 oz) (12/14 2045)   General appearance: alert, cooperative and no distress Resp: clear to auscultation bilaterally Cardio: regular rate and rhythm GI: normal findings: bowel sounds normal and soft, non-tender  Lab Results  Recent Labs  05/09/14 0432 05/09/14 0500 05/09/14 0644 05/10/14 0534  WBC  --  3.0*  --  3.3*  HGB  --  7.5*  --  8.1*  HCT  --  24.2*  --  26.2*  NA 137  --  135*  --   K 4.2  --  4.2  --   CL 99  --  98  --   CO2 22  --  22  --   BUN 39*  --  40*  --   CREATININE 6.88*  --  6.93*  --    Liver Panel  Recent Labs  05/09/14 0644  ALBUMIN 2.1*   Sedimentation Rate No results for input(s): ESRSEDRATE in the last 72 hours. C-Reactive Protein No results for input(s): CRP in the last 72 hours.  Microbiology: Recent Results (from the past 240 hour(s))  Culture, blood (routine x 2)     Status: None (Preliminary result)   Collection Time: 05/05/14  8:44 PM  Result Value Ref Range Status   Specimen Description BLOOD ARM LEFT  Final   Special Requests BOTTLES DRAWN AEROBIC AND ANAEROBIC 10CC  Final   Culture  Setup Time   Final    05/06/2014 00:38 Performed at [facility]    Culture   Final           BLOOD CULTURE RECEIVED NO GROWTH TO DATE CULTURE WILL BE HELD FOR 5 DAYS BEFORE ISSUING A FINAL NEGATIVE REPORT Performed at [facility]    Report Status PENDING  Incomplete  Culture, blood (routine x 2)     Status: None (Preliminary result)   Collection Time: 05/05/14  8:51 PM  Result Value Ref Range Status   Specimen Description BLOOD WRIST LEFT  Final    Special Requests BOTTLES DRAWN AEROBIC AND ANAEROBIC 5CC  Final   Culture  Setup Time   Final    05/06/2014 00:39 Performed at [facility]    Culture   Final           BLOOD CULTURE RECEIVED NO GROWTH TO DATE CULTURE WILL BE HELD FOR 5 DAYS BEFORE ISSUING A FINAL NEGATIVE REPORT Performed at [facility]    Report Status PENDING  Incomplete  MRSA PCR Screening     Status: None   Collection Time: 05/06/14 12:19 AM  Result Value Ref Range Status   MRSA by PCR NEGATIVE NEGATIVE Final    Comment:        The GeneXpert MRSA Assay (FDA approved for NASAL specimens only), is one component of a comprehensive MRSA colonization surveillance program. It is not intended to diagnose MRSA infection nor to guide or monitor treatment for MRSA infections.   Culture, blood (routine x 2)     Status: None (Preliminary result)   Collection Time: 05/06/14  4:50 PM  Result Value Ref Range Status   Specimen Description BLOOD LEFT ARM  Final   Special Requests BOTTLES DRAWN AEROBIC AND ANAEROBIC 10CC  Final   Culture  Setup Time   Final    05/06/2014 22:41 Performed at [facility]    Culture   Final           BLOOD CULTURE RECEIVED NO GROWTH TO DATE CULTURE WILL BE HELD FOR 5 DAYS BEFORE ISSUING A FINAL NEGATIVE REPORT Performed at [facility]    Report Status PENDING  Incomplete    Studies/Results: Dg Chest Port 1 View  05/08/2014   CLINICAL DATA:  Oxygen desaturation.  EXAM: PORTABLE CHEST - 1 VIEW  COMPARISON:  May 05, 2014.  FINDINGS: Stable cardiomegaly. Sternotomy wires are again noted. Bilateral subclavian vascular stents are again noted. No pneumothorax is noted. Increased bibasilar opacities are noted concerning for worsening edema or pneumonia. Minimal bilateral pleural effusions are noted. Bony thorax is intact.  IMPRESSION: Increased bibasilar opacities are noted concerning for worsening edema or possibly pneumonia. Minimal bilateral pleural  effusions are noted as well.   Electronically Signed   By: Sabino Dick M.D.   On: 05/08/2014 18:06  Assessment/Plan: R IJ DVT FUO "Multifocal pneumonia" on CT scan  Total days of antibiotics 5 vanco/cefepime  Will change cefepime to invanz due to concern over septic thrombophlebitis. Stop vanco as no evidence of staph.  Home with augmentin? Will continue to watch temp curve On heparin Dr Linus Salmons available if questions after 12-16.         Bobby Rumpf Infectious Diseases (pager) [phone] [email or web address] 05/10/2014, 5:12 PM  LOS: 5 days

## 2014-05-10 NOTE — Progress Notes (Signed)
UR Completed Jermy Couper Graves-Bigelow, RN,BSN [phone]  

## 2014-05-11 ENCOUNTER — Inpatient Hospital Stay (HOSPITAL_COMMUNITY): Payer: Medicare Other

## 2014-05-11 DIAGNOSIS — N189 Chronic kidney disease, unspecified: Secondary | ICD-10-CM

## 2014-05-11 DIAGNOSIS — E1122 Type 2 diabetes mellitus with diabetic chronic kidney disease: Secondary | ICD-10-CM

## 2014-05-11 DIAGNOSIS — Z941 Heart transplant status: Secondary | ICD-10-CM

## 2014-05-11 LAB — FUNGUS CULTURE W SMEAR: FUNGAL SMEAR: NONE SEEN

## 2014-05-11 LAB — GLUCOSE, CAPILLARY: Glucose-Capillary: 80 mg/dL (ref 70–99)

## 2014-05-11 LAB — RENAL FUNCTION PANEL
ALBUMIN: 2.2 g/dL — AB (ref 3.5–5.2)
Anion gap: 16 — ABNORMAL HIGH (ref 5–15)
BUN: 27 mg/dL — ABNORMAL HIGH (ref 6–23)
CO2: 23 meq/L (ref 19–32)
CREATININE: 6.27 mg/dL — AB (ref 0.50–1.35)
Calcium: 8.9 mg/dL (ref 8.4–10.5)
Chloride: 99 mEq/L (ref 96–112)
GFR, EST AFRICAN AMERICAN: 9 mL/min — AB (ref >90)
GFR, EST NON AFRICAN AMERICAN: 8 mL/min — AB (ref >90)
Glucose, Bld: 87 mg/dL (ref 70–99)
PHOSPHORUS: 2.3 mg/dL (ref 2.3–4.6)
Potassium: 3.3 mEq/L — ABNORMAL LOW (ref 3.7–5.3)
SODIUM: 138 meq/L (ref 137–147)

## 2014-05-11 LAB — CBC
HCT: 24.8 % — ABNORMAL LOW (ref 39.0–52.0)
HEMOGLOBIN: 7.9 g/dL — AB (ref 13.0–17.0)
MCH: 29.6 pg (ref 26.0–34.0)
MCHC: 31.9 g/dL (ref 30.0–36.0)
MCV: 92.9 fL (ref 78.0–100.0)
Platelets: 114 10*3/uL — ABNORMAL LOW (ref 150–400)
RBC: 2.67 MIL/uL — AB (ref 4.22–5.81)
RDW: 16.8 % — ABNORMAL HIGH (ref 11.5–15.5)
WBC: 3 10*3/uL — ABNORMAL LOW (ref 4.0–10.5)

## 2014-05-11 LAB — PROTIME-INR
INR: 1.12 (ref 0.00–1.49)
PROTHROMBIN TIME: 14.5 s (ref 11.6–15.2)

## 2014-05-11 LAB — HEPARIN LEVEL (UNFRACTIONATED): HEPARIN UNFRACTIONATED: 0.59 [IU]/mL (ref 0.30–0.70)

## 2014-05-11 LAB — EPSTEIN BARR VRS(EBV DNA BY PCR): EBV DNA QN by PCR: <200 copies/mL

## 2014-05-11 MED ORDER — ALTEPLASE 2 MG IJ SOLR
2.0000 mg | Freq: Once | INTRAMUSCULAR | Status: AC | PRN
  Filled 2014-05-11: qty 2

## 2014-05-11 MED ORDER — LIDOCAINE-PRILOCAINE 2.5-2.5 % EX CREA: 1.0000 "application " | TOPICAL_CREAM | CUTANEOUS | Status: DC | PRN

## 2014-05-11 MED ORDER — NEPRO/CARBSTEADY PO LIQD: 237.0000 mL | ORAL | Status: DC | PRN

## 2014-05-11 MED ORDER — ENOXAPARIN SODIUM 40 MG/0.4ML ~~LOC~~ SOLN: 70.0000 mg | SUBCUTANEOUS | Status: DC

## 2014-05-11 MED ORDER — NEPRO/CARBSTEADY PO LIQD
237.0000 mL | ORAL | Status: DC | PRN
Start: 2014-05-11 — End: 2014-08-29

## 2014-05-11 MED ORDER — WARFARIN SODIUM 5 MG PO TABS: 5.0000 mg | ORAL_TABLET | Freq: Every day | ORAL | Status: DC

## 2014-05-11 MED ORDER — ENOXAPARIN SODIUM 80 MG/0.8ML ~~LOC~~ SOLN
70.0000 mg | SUBCUTANEOUS | Status: DC
  Administered 2014-05-11 – 2014-05-12 (×2): 70 mg via SUBCUTANEOUS
  Filled 2014-05-11 (×2): qty 0.8

## 2014-05-11 MED ORDER — HEPARIN SODIUM (PORCINE) 1000 UNIT/ML DIALYSIS
1000.0000 [IU] | INTRAMUSCULAR | Status: DC | PRN
  Filled 2014-05-11: qty 1

## 2014-05-11 MED ORDER — CEPHALEXIN 250 MG PO CAPS: 250.0000 mg | ORAL_CAPSULE | Freq: Two times a day (BID) | ORAL | Status: DC

## 2014-05-11 MED ORDER — WARFARIN SODIUM 5 MG PO TABS
5.0000 mg | ORAL_TABLET | Freq: Once | ORAL | Status: AC
  Administered 2014-05-11: 5 mg via ORAL
  Filled 2014-05-11: qty 1

## 2014-05-11 MED ORDER — DOXERCALCIFEROL 4 MCG/2ML IV SOLN
INTRAVENOUS | Status: AC
  Administered 2014-05-11: 1 ug via INTRAVENOUS
  Filled 2014-05-11: qty 2

## 2014-05-11 MED ORDER — CEPHALEXIN 250 MG PO CAPS
250.0000 mg | ORAL_CAPSULE | Freq: Two times a day (BID) | ORAL | Status: DC
  Administered 2014-05-11 – 2014-05-12 (×3): 250 mg via ORAL
  Filled 2014-05-11 (×4): qty 1

## 2014-05-11 MED ORDER — PENTAFLUOROPROP-TETRAFLUOROETH EX AERO: 1.0000 "application " | INHALATION_SPRAY | CUTANEOUS | Status: DC | PRN

## 2014-05-11 MED ORDER — LIDOCAINE HCL (PF) 1 % IJ SOLN: 5.0000 mL | INTRAMUSCULAR | Status: DC | PRN

## 2014-05-11 MED ORDER — SODIUM CHLORIDE 0.9 % IV SOLN: 100.0000 mL | INTRAVENOUS | Status: DC | PRN

## 2014-05-11 MED ORDER — ENOXAPARIN SODIUM 80 MG/0.8ML ~~LOC~~ SOLN: 70.0000 mg | SUBCUTANEOUS | Status: DC

## 2014-05-11 NOTE — Progress Notes (Signed)
Patient somewhat confused post HD. On exam-answers some questions appropriately-able to tell me his name, wife's name and the president's name, but does not know where he is. Speech clear, strength 5/5 all over.Vitals-low grade temp, slightly tachycardic, otherwise-exam remains essentially unchanged. Since on anticoagulation-will check CT head. Repeat temp in a while to make sure he has not developed more of a fever. Will continue to monitor, hold discharge for now.

## 2014-05-11 NOTE — Care Management Note (Signed)
CARE MANAGEMENT NOTE 05/11/2014  Patient:  Brandon Sharp, Brandon Sharp   Account Number:  [id number]  Date Initiated:  05/10/2014  Documentation initiated by:  Jeanne Terrance  Subjective/Objective Assessment:   CM following for progression and d/c planning.     Action/Plan:   05/10/2014 Met with pt and discussed cost of Lovenox, awaiting copay cost. Pt will return to home with [facility] for [place] followup.   Anticipated DC Date:  05/11/2014   Anticipated DC Plan:  [place]         [facility] Choice  HOME HEALTH   Choice offered to / List presented to:  C-1 Patient        [place] arranged  HH-1 RN  [place].   Status of service:  Completed, signed off Medicare Important Message given?  YES (If response is "NO", the following Medicare IM given date fields will be blank) Date Medicare IM given:  05/10/2014 Medicare IM given by:  March Steyer Date Additional Medicare IM given:   Additional Medicare IM given by:    Discharge Disposition:    Per UR Regulation:    If discussed at Long Length of Stay Meetings, dates discussed:    Comments:  05/11/2014 Able to determine from pt pharmacy [facility] on [address], his Lovenox will cost the pt $7.53 out of pocket.   CRoyal RN MPH case manager, [phone]

## 2014-05-11 NOTE — Progress Notes (Signed)
On follow up-CT head neg. Somewhat better, will monitor overnight. If remains confused in am, will consider further work up. Follow clincally.

## 2014-05-11 NOTE — Discharge Summary (Addendum)
PATIENT DETAILS Name: Brandon Sharp Age: 68 y.o. Sex: male Date of Birth: [birth year] MRN: [id number]. Admitting Physician: Orvan Falconer, MD YBO:[id number],HENIDPO C, MD  Admit Date: 05/05/2014 Discharge date: 05/12/2014  Recommendations for Outpatient Follow-up:  1. Started on Coumadin/Lovenox-please continue Lovenox till INR therapeutic 2. Continue Keflex for 2 more weeks from 12/16 per recommendations of Inf Disease MD 3. This is his second episode of DVT, may need life long anticoagulation-will defer to PCP  4. Nephrology to draw INR at HD, and fax results to PCP for adjustment of coumadin dose-being discharged on 5 mg of coumadin started on 05/10/14.  PRIMARY DISCHARGE DIAGNOSIS:  Principal Problem:   Fever of unknown origin (FUO) Active Problems:   Heart transplanted   DM (diabetes mellitus), type 2 with renal complications   ESRD on hemodialysis   Healthcare associated bacterial pneumonia   FUO (fever of unknown origin)   Protein-calorie malnutrition, severe      PAST MEDICAL HISTORY: Past Medical History  Diagnosis Date  . Diabetes mellitus   . Hypertension   . Myocardial infarction 1985; 1990  . Angina   . Coronary artery disease   . Dysrhythmia   . DVT (deep venous thrombosis) ~ 12/2010    LLE  . Anemia   . Blood transfusion 06/1999    post heart transplant  . Peripheral vascular disease   . CHF (congestive heart failure) 2001    beffore transplant  . Renal failure     Hemodialysis MWF last 2 years, sse dr [name] nephrology, goes to [facility]    DISCHARGE MEDICATIONS: Current Discharge Medication List    START taking these medications   Details  cephALEXin (KEFLEX) 250 MG capsule Take 1 capsule (250 mg total) by mouth every 12 (twelve) hours. 14 more days from 05/11/14 Qty: 28 capsule, Refills: 0    enoxaparin (LOVENOX) 40 MG/0.4ML injection Inject 0.7 mLs (70 mg total) into the skin daily. Qty: 6 Syringe, Refills: 0      Nutritional Supplements (FEEDING SUPPLEMENT, NEPRO CARB STEADY,) LIQD Take 237 mLs by mouth as needed (missed meal during dialysis.). Qty: 30 Can, Refills: 0    warfarin (COUMADIN) 5 MG tablet Take 1 tablet (5 mg total) by mouth daily. Qty: 30 tablet, Refills: 0      CONTINUE these medications which have NOT CHANGED   Details  aspirin EC 81 MG tablet Take 81 mg by mouth daily.      atorvastatin (LIPITOR) 40 MG tablet Take 40 mg by mouth daily.      azaTHIOprine (IMURAN) 50 MG tablet Take 75 mg by mouth every morning.     b complex-vitamin c-folic acid (NEPHRO-VITE) 0.8 MG TABS tablet Take 1 tablet by mouth at bedtime.    calcium acetate (PHOSLO) 667 MG capsule Take 667 mg by mouth 3 (three) times daily with meals.     carvedilol (COREG) 6.25 MG tablet Take 1 tablet (6.25 mg total) by mouth at bedtime. Qty: 30 tablet, Refills: 0    colesevelam (WELCHOL) 625 MG tablet Take 3,750 mg by mouth daily. Takes 6 tabs daily    famotidine (PEPCID AC) 10 MG chewable tablet Chew 10 mg by mouth at bedtime.     HUMALOG KWIKPEN 100 UNIT/ML SOPN Inject 6-10 Units into the skin 2 (two) times daily. Per sliding scale    insulin glargine (LANTUS) 100 UNIT/ML injection Inject 20 Units into the skin every morning.    isosorbide dinitrate (ISORDIL) 40 MG tablet Take 40  mg by mouth 3 (three) times daily.      predniSONE (DELTASONE) 10 MG tablet Take 1 tablet (10 mg total) by mouth daily with breakfast. Qty: 30 tablet, Refills: 0    sirolimus (RAPAMUNE) 2 MG tablet Take 2 mg by mouth daily.        ALLERGIES:   Allergies  Allergen Reactions  . Lisinopril Swelling    Lips and tongue swell  . Niacin And Related Other (See Comments)    unknown  . Norvasc [Amlodipine Besylate] Rash    Flushing  . 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 He was admitted on 12/10 for recurrent fever.  CONSULTATIONS:   ID, nephrology and vascular surgery  PERTINENT RADIOLOGIC STUDIES: Dg Chest 2 View  05/05/2014   CLINICAL DATA:  Fever of 102. Vomiting started today. Cough for 3 months. History of heart transplant in 2001. Diabetes, hypertension, CHF. Dialysis. CABG x3.  EXAM: CHEST  2 VIEW  COMPARISON:  04/14/2014  FINDINGS: Status post median sternotomy. Heart is enlarged. Stents are identified in the upper chest. There has been some improvement in aeration of the lung bases. Patchy densities persist however. No definite pleural effusions. No pulmonary edema.  IMPRESSION: Cardiomegaly. Persistent bilateral airspace filling opacities with some improvement in aeration since prior studies.   Electronically Signed   By: Shon Hale M.D.   On: 05/05/2014 21:00   Ct Chest Wo Contrast  04/13/2014   CLINICAL DATA:  68 year old immunocompromised male with intermittent fever. Initial encounter. Current history of renal failure status post renal transplant and transplant project.  EXAM: CT CHEST WITHOUT CONTRAST  TECHNIQUE: Multidetector CT imaging of the chest was performed following the standard protocol without IV contrast.  COMPARISON:  Chest radiograph 04/11/2014 and earlier. Chest CT 04/07/2014  FINDINGS: Improved lung volumes. Decreased dependent atelectasis in both lungs, however, confluent peribronchovascular opacity persists in both lower lobes in the lingula, with central air bronchograms. See series 3. The right middle lobe is spared. The right upper lobe is spared. The superior aspect of the left upper lobe is spared.  Only trace associated left pleural fluid. No pericardial effusion. Stable cardiomegaly. No hilar or mediastinal lymphadenopathy. No axillary lymphadenopathy.  Gynecomastia. Stable visualized upper abdominal viscera.  Aortic ectasia and calcified atherosclerosis re- identified. Coronary artery stents and atherosclerosis. Sequelae of median sternotomy. Right subclavian venous vascular stent re- identified.  No acute osseous abnormality identified.  IMPRESSION: 1. Improved lung volumes with resolved dependent atelectasis, but persistent confluent bilateral lower lobe and lingula peribronchovascular consolidation. Favor bronchopneumonia in this setting. Noninfectious considerations such as post transplant lymphoproliferative disorder are felt less likely. 2. Trace left pleural fluid. 3. Otherwise stable chest and upper abdominal viscera compared to 04/07/2014.   Electronically Signed   By: Lars Pinks M.D.   On: 04/13/2014 06:00   Ct Chest W Contrast  05/06/2014   CLINICAL DATA:  68 year old male with fever of unknown origin  EXAM: CT CHEST, ABDOMEN, AND PELVIS WITH CONTRAST  TECHNIQUE: Multidetector CT imaging of the chest, abdomen and pelvis was performed following the standard protocol during bolus administration of intravenous contrast.  CONTRAST:  41mL OMNIPAQUE IOHEXOL 300 MG/ML  SOLN  COMPARISON:  04/13/2014  FINDINGS: CT CHEST FINDINGS  Heart/mediastinum: Median sternotomy  wires are present. The heart is mildly enlarged. Scattered atherosclerotic aortic and coronary artery calcifications are present. There is no pericardial effusion. There is mild aneurysmal dilatation of the ascending aorta measured 3.9 cm. There is no aortic dissection. Several collateral vessels are noted within the soft tissues of the left lateral chest. An unchanged 7 mm left upper lobe pulmonary nodule is present (series 2, image 32).  There are no pathologically axillary lymph nodes.  Chest: Upper lobe predominant emphysematous changes are present. Similar appearing bibasilar and linear patchy areas of airspace consolidation is noted. Interstitial thickening is also present. Small bilateral pleural effusions are present. There is no pneumothorax  upper lobe predominant centrilobular and paraseptal emphysematous changes are present. No suspicious pulmonary nodules or masses are identified. A few scattered calcified pulmonary nodules are noted.  Gynecomastia is noted.  CT ABDOMEN AND PELVIS FINDINGS  Liver: A 2.2 cm right and left hepatic lobe cysts are noted. Additional subcentimeter hypodense renal lesions are too small to fully characterize.  Gallbladder: There is no cholelithiasis or biliary sludge. There is no pericholecystic fluid. There is no abnormal gallbladder wall thickening. There is no intrahepatic or extrahepatic biliary ductal dilatation.  Spleen: Unremarkable.  Pancreas: Unremarkable.  Adrenal glands: Unremarkable.  Kidneys: Multiple hypodense lesions of varying sizes are seen noted throughout the kidneys, compatible with cysts. Renal cortical thinning is also present bilaterally. There is no hydronephrosis. Several tiny nonobstructing calcific densities are noted bilaterally. A right lower quadrant renal transplant is present.  Bowel/gastrointestinal tract: There is colonic diverticulosis without definite evidence for acute diverticulitis. There is no abnormal bowel wall thickening. There is no evidence of bowel obstruction  Pelvis: There is no pelvic mass. The urinary bladder is decompressed. The prostate gland is mildly enlarged and measures approximately 5.1 x 2.5 cm.  Miscellaneous: Extensive vascular calcifications are present. There is mild ectasia of the abdominal aorta.  Osseous structures: Multilevel degenerative changes of the spine are present. Degenerative changes of the hips are also noted. No acute osseous abnormality is identified.  IMPRESSION: 1. Findings remain concerning for multifocal pneumonia. 2. Colonic diverticulosis without definite evidence for acute diverticulitis. 3. Chronic medical renal disease. 4. Prostatic enlargement. 5. Aneurysmal dilatation of the ascending aorta. 6. Cardiomegaly. 7. 7 mm left upper lobe  pulmonary nodule. If the patient is at high risk for bronchogenic carcinoma, follow-up chest CT at 3-45months is recommended. If the patient is at low risk for bronchogenic carcinoma, follow-up chest CT at 6-12 months is recommended. This recommendation follows the consensus statement: Guidelines for Management of Small Pulmonary Nodules Detected on CT Scans: A Statement from the [place] as published in Radiology 2005; 237:395-400. 8. Probable superimposed mild interstitial edema and trace bilateral pleural effusions.   Electronically Signed   By: Rosemarie Ax   On: 05/06/2014 08:39   Mr Brain Wo Contrast  04/14/2014   CLINICAL DATA:  68 year old male with immunosuppression following organ transplantation presents with symptoms of fever, malaise, and encephalopathy. Initial encounter.  EXAM: MRI HEAD WITHOUT CONTRAST  TECHNIQUE: Multiplanar, multiecho pulse sequences of the brain and surrounding structures were obtained without intravenous contrast.  COMPARISON:  CT head 04/06/2014.  MR head 05/15/2003.  FINDINGS: There is a 1 cm sized area of diffusion restriction in the RIGHT occipital subcortical white matter with slight associated T2 and FLAIR prolongation. No other similar lesions. No acute hemorrhage is present, although is small focus chronic hemorrhage was noted in 2004 in the RIGHT posterior temporal subcortical white matter, with  additional tiny foci of chronic hemorrhage in the RIGHT temporal and LEFT frontal cortex.  Generalized atrophy. Multiple BILATERAL chronic cerebellar infarcts. BILATERAL deep nuclei chronic lacunar infarcts. Minor changes of subcortical and periventricular white matter signal abnormality, likely chronic microvascular ischemic change.  Normal pituitary and cerebellar tonsils. Advanced cervical spondylosis with suspected slight cord compression.  Flow voids are maintained in the carotid, basilar, and vertebral arteries.  Abnormalities of the extracranial soft  tissues include significant fluid accumulation with probable retention cyst formation the LEFT maxillary sinus causing expansion, and BILATERAL T2 hyperintense fluid in the mastoid air cells, favored to represent benign effusions. No sinus air-fluid level. Negative orbits. Extracranial soft tissues unremarkable.  No contrast was given in this patient renal failure, but there are no obvious features of pachymeningeal thickening or sulcal FLAIR hyperintensity to suggest meningitis.  IMPRESSION: Nonspecific 1 cm focus of restricted diffusion in the RIGHT occipital subcortical white matter. The patient has evidence for multiple remote areas of infarction and certainly this is a leading differential consideration.  In the setting of immunosuppression, with the patient on Imuran, neurotoxicity secondary to this drug is associated with abnormal diffusion signal in the posterior circulation, including occipital cortex and white matter. Similar findings were seen on prior MR from 2004, but were more extensive at that time.  Cerebritis or brain abscess, well also associated with restricted diffusion, is not favored based on lesion morphology and lack of surrounding vasogenic edema. Intracranial infection is not favored as a cause of the patient's fever. In addition, LEFT maxillary sinus disease and BILATERAL mastoid effusions are likely non-worrisome.   Electronically Signed   By: Rolla Flatten M.D.   On: 04/14/2014 15:44   Ct Abdomen Pelvis W Contrast  05/06/2014   CLINICAL DATA:  68 year old male with fever of unknown origin  EXAM: CT CHEST, ABDOMEN, AND PELVIS WITH CONTRAST  TECHNIQUE: Multidetector CT imaging of the chest, abdomen and pelvis was performed following the standard protocol during bolus administration of intravenous contrast.  CONTRAST:  4mL OMNIPAQUE IOHEXOL 300 MG/ML  SOLN  COMPARISON:  04/13/2014  FINDINGS: CT CHEST FINDINGS  Heart/mediastinum: Median sternotomy wires are present. The heart is mildly  enlarged. Scattered atherosclerotic aortic and coronary artery calcifications are present. There is no pericardial effusion. There is mild aneurysmal dilatation of the ascending aorta measured 3.9 cm. There is no aortic dissection. Several collateral vessels are noted within the soft tissues of the left lateral chest. An unchanged 7 mm left upper lobe pulmonary nodule is present (series 2, image 32).  There are no pathologically axillary lymph nodes.  Chest: Upper lobe predominant emphysematous changes are present. Similar appearing bibasilar and linear patchy areas of airspace consolidation is noted. Interstitial thickening is also present. Small bilateral pleural effusions are present. There is no pneumothorax upper lobe predominant centrilobular and paraseptal emphysematous changes are present. No suspicious pulmonary nodules or masses are identified. A few scattered calcified pulmonary nodules are noted.  Gynecomastia is noted.  CT ABDOMEN AND PELVIS FINDINGS  Liver: A 2.2 cm right and left hepatic lobe cysts are noted. Additional subcentimeter hypodense renal lesions are too small to fully characterize.  Gallbladder: There is no cholelithiasis or biliary sludge. There is no pericholecystic fluid. There is no abnormal gallbladder wall thickening. There is no intrahepatic or extrahepatic biliary ductal dilatation.  Spleen: Unremarkable.  Pancreas: Unremarkable.  Adrenal glands: Unremarkable.  Kidneys: Multiple hypodense lesions of varying sizes are seen noted throughout the kidneys, compatible with cysts. Renal cortical thinning is  also present bilaterally. There is no hydronephrosis. Several tiny nonobstructing calcific densities are noted bilaterally. A right lower quadrant renal transplant is present.  Bowel/gastrointestinal tract: There is colonic diverticulosis without definite evidence for acute diverticulitis. There is no abnormal bowel wall thickening. There is no evidence of bowel obstruction  Pelvis:  There is no pelvic mass. The urinary bladder is decompressed. The prostate gland is mildly enlarged and measures approximately 5.1 x 2.5 cm.  Miscellaneous: Extensive vascular calcifications are present. There is mild ectasia of the abdominal aorta.  Osseous structures: Multilevel degenerative changes of the spine are present. Degenerative changes of the hips are also noted. No acute osseous abnormality is identified.  IMPRESSION: 1. Findings remain concerning for multifocal pneumonia. 2. Colonic diverticulosis without definite evidence for acute diverticulitis. 3. Chronic medical renal disease. 4. Prostatic enlargement. 5. Aneurysmal dilatation of the ascending aorta. 6. Cardiomegaly. 7. 7 mm left upper lobe pulmonary nodule. If the patient is at high risk for bronchogenic carcinoma, follow-up chest CT at 3-61months is recommended. If the patient is at low risk for bronchogenic carcinoma, follow-up chest CT at 6-12 months is recommended. This recommendation follows the consensus statement: Guidelines for Management of Small Pulmonary Nodules Detected on CT Scans: A Statement from the [place] as published in Radiology 2005; 237:395-400. 8. Probable superimposed mild interstitial edema and trace bilateral pleural effusions.   Electronically Signed   By: Rosemarie Ax   On: 05/06/2014 08:39   Dg Chest Port 1 View  05/08/2014   CLINICAL DATA:  Oxygen desaturation.  EXAM: PORTABLE CHEST - 1 VIEW  COMPARISON:  May 05, 2014.  FINDINGS: Stable cardiomegaly. Sternotomy wires are again noted. Bilateral subclavian vascular stents are again noted. No pneumothorax is noted. Increased bibasilar opacities are noted concerning for worsening edema or pneumonia. Minimal bilateral pleural effusions are noted. Bony thorax is intact.  IMPRESSION: Increased bibasilar opacities are noted concerning for worsening edema or possibly pneumonia. Minimal bilateral pleural effusions are noted as well.   Electronically  Signed   By: Sabino Dick M.D.   On: 05/08/2014 18:06   Dg Chest Port 1 View  04/14/2014   CLINICAL DATA:  Status post left bronchoscopy lung biopsy.  EXAM: PORTABLE CHEST - 1 VIEW  COMPARISON:  04/11/2014  FINDINGS: Persistent bibasilar pulmonary airspace opacity again seen, left side greater than right. No evidence of pneumothorax. Cardiomegaly is stable. Bilateral subclavian stents again seen in place. Patient has undergone previous median sternotomy.  IMPRESSION: No significant change in bibasilar airspace disease and cardiomegaly. No pneumothorax identified.   Electronically Signed   By: Earle Gell M.D.   On: 04/14/2014 12:32   Dg Chest Port 1 View  04/11/2014   CLINICAL DATA:  Fever and chills.  EXAM: PORTABLE CHEST - 1 VIEW  COMPARISON:  Chest CT, 04/07/2014.  Chest radiographs, 04/06/2014.  FINDINGS: Patchy areas of left mid and lower lung consolidation and milder right lung base consolidation, this similar to the prior studies allowing for differences in technique and inspiratory volumes. No new areas of lung consolidation. No convincing pulmonary edema. No pleural effusion or pneumothorax.  Changes from cardiac surgery are stable. Cardiac silhouette is mildly enlarged. No mediastinal or hilar masses. Stable right subclavian vein stent.  IMPRESSION: Persist consolidation in the left mid and lower lung and in the right lung base similar to the prior studies. This is most suggestive of multifocal pneumonia. No new abnormalities.   Electronically Signed   By: Dedra Skeens.D.  On: 04/11/2014 20:46   Dg C-arm Bronchoscopy  04/14/2014   CLINICAL DATA:    C-ARM BRONCHOSCOPY  Fluoroscopy was utilized by the requesting physician.  No radiographic  interpretation.      PERTINENT LAB RESULTS: CBC:  Recent Labs  05/10/14 0534 05/11/14 0600  WBC 3.3* 3.0*  HGB 8.1* 7.9*  HCT 26.2* 24.8*  PLT 118* 114*   CMET CMP     Component Value Date/Time   NA 138 05/11/2014 0823   K 3.3*  05/11/2014 0823   CL 99 05/11/2014 0823   CO2 23 05/11/2014 0823   GLUCOSE 87 05/11/2014 0823   BUN 27* 05/11/2014 0823   CREATININE 6.27* 05/11/2014 0823   CALCIUM 8.9 05/11/2014 0823   PROT 5.9* 05/05/2014 1726   ALBUMIN 2.2* 05/11/2014 0823   AST 28 05/05/2014 1726   ALT 9 05/05/2014 1726   ALKPHOS 56 05/05/2014 1726   BILITOT 0.4 05/05/2014 1726   GFRNONAA 8* 05/11/2014 0823   GFRAA 9* 05/11/2014 0823    GFR Estimated Creatinine Clearance: 11.3 mL/min (by C-G formula based on Cr of 6.27). No results for input(s): LIPASE, AMYLASE in the last 72 hours. No results for input(s): CKTOTAL, CKMB, CKMBINDEX, TROPONINI in the last 72 hours. Invalid input(s): POCBNP No results for input(s): DDIMER in the last 72 hours. No results for input(s): HGBA1C in the last 72 hours. No results for input(s): CHOL, HDL, LDLCALC, TRIG, CHOLHDL, LDLDIRECT in the last 72 hours. No results for input(s): TSH, T4TOTAL, T3FREE, THYROIDAB in the last 72 hours.  Invalid input(s): FREET3 No results for input(s): VITAMINB12, FOLATE, FERRITIN, TIBC, IRON, RETICCTPCT in the last 72 hours. Coags:  Recent Labs  05/11/14 0820  INR 1.12   Microbiology: Recent Results (from the past 240 hour(s))  Culture, blood (routine x 2)     Status: None (Preliminary result)   Collection Time: 05/05/14  8:44 PM  Result Value Ref Range Status   Specimen Description BLOOD ARM LEFT  Final   Special Requests BOTTLES DRAWN AEROBIC AND ANAEROBIC 10CC  Final   Culture  Setup Time   Final    05/06/2014 00:38 Performed at [facility]    Culture   Final           BLOOD CULTURE RECEIVED NO GROWTH TO DATE CULTURE WILL BE HELD FOR 5 DAYS BEFORE ISSUING A FINAL NEGATIVE REPORT Performed at [facility]    Report Status PENDING  Incomplete  Culture, blood (routine x 2)     Status: None (Preliminary result)   Collection Time: 05/05/14  8:51 PM  Result Value Ref Range Status   Specimen Description BLOOD WRIST  LEFT  Final   Special Requests BOTTLES DRAWN AEROBIC AND ANAEROBIC 5CC  Final   Culture  Setup Time   Final    05/06/2014 00:39 Performed at [facility]    Culture   Final           BLOOD CULTURE RECEIVED NO GROWTH TO DATE CULTURE WILL BE HELD FOR 5 DAYS BEFORE ISSUING A FINAL NEGATIVE REPORT Performed at [facility]    Report Status PENDING  Incomplete  MRSA PCR Screening     Status: None   Collection Time: 05/06/14 12:19 AM  Result Value Ref Range Status   MRSA by PCR NEGATIVE NEGATIVE Final    Comment:        The GeneXpert MRSA Assay (FDA approved for NASAL specimens only), is one component of a comprehensive MRSA  colonization surveillance program. It is not intended to diagnose MRSA infection nor to guide or monitor treatment for MRSA infections.   Culture, blood (routine x 2)     Status: None (Preliminary result)   Collection Time: 05/06/14  4:50 PM  Result Value Ref Range Status   Specimen Description BLOOD LEFT ARM  Final   Special Requests BOTTLES DRAWN AEROBIC AND ANAEROBIC 10CC  Final   Culture  Setup Time   Final    05/06/2014 22:41 Performed at [facility]    Culture   Final           BLOOD CULTURE RECEIVED NO GROWTH TO DATE CULTURE WILL BE HELD FOR 5 DAYS BEFORE ISSUING A FINAL NEGATIVE REPORT Performed at [facility]    Report Status PENDING  Incomplete     BRIEF HOSPITAL COURSE:  Fever of unknown origin (FUO):ongoing for 4-6 weeks-has had 2 recent hospitalizations for the same issue, so far extensive workup including blood cultures, urine cultures, bronchial with negative AFB and fungal smears so far, question multifocal pneumonia on CT scan. Further workup has demonstrated a right IJ DVT and also has significant swelling of his right arm. Fevers now suspected to be from septic thrombophlebitis. Infectious disease was consulted, patient was maintained on intravenous cefepime and vancomycin during his hospital stay. Case  discussed with Dr. Emmit Alexanders MD on 12/16 over the phone who reviewed the patient's chart, and recommended to transition to oral Keflex for 2 weeks from 12/16. Patient was started on intravenous heparin  right IJ DVT, on discharge she is being transitioned to Lovenox/Coumadin.  Active Problems: Right IJ DVT: Was maintained on IV heparin, given swelling of his right upper extremity-seen by vascular surgery, underwent for a fistulogram of his right upper extremity with venoplasty of right subclavian vein and right innominate vein on 05/09/14. Some decrease in the right arm swelling. Started Coumadin, and transitioned to Lovenox on discharge. Lovenox teaching completed by RN, per Case Management, not a significant copay for the patient as well. Goal INR is 2-3, he will need coumadin dose adjustment, nephrology will draw INR during HD, and fax results to PCP's office. Interestingly he has had a hx of prior DVT, suspect he may need life long anticoagulation-I will defer this decision to patient's PCP  Addendum: Please note this M.D. spoke with patient at length, educated regarding importance of Lovenox overlap for only 5-6 days or till INR therapeutic. Patient was also informed that after speaking with the renal team, INR will be drawn at dialysis, results will be faxed over to his PCPs office. I explained the importance of keeping the INR between 2 and 3. Please note, patient has been on Coumadin in the past for prior lower extremity DVT.  Transient Encephalopathy: Occurred post dialysis on 12/16-planned discharge on 12/16 was held. Very low-grade fever yesterday, but no fever since then. Encephalopathy has completely resolved, patient is back to baseline today. CT of the head was negative. Patient attributes prior similar spells if she he did not eat prior to dialysis. I am not sure what exactly happened to this patient yesterday that caused encephalopathy, however he is back to his usual baseline without any  significant treatment. He's had an extensive workup for FUO during this admission and past hospitalization as well. I suspect he is stable to be discharged home today for further close monitoring to be done in the outpatient setting.  Heart transplant, failed kidney transplant: Continue home immunosuppressants. Follow with transplant clinic  post discharge.  Anemia:suspect secondary to acute illness on chronic anemia. Follow CBC closely as outpatient, no overt bleeding.  Thrombocytopenia:suspet secondary to acute febrile illness.Platelets low but slowly increasing.Last platelet count at 114 K at time of discharge  ESRD: On Monday Wednesday Friday schedule. Renal consulted during this hospital stay.Patient will follow at HD clinic post discharge at his usual schedule  DM (diabetes mellitus), type 2 with renal complications: CBGs stable, continue Lantus on discharge  Hypertension: Continue with Coreg, Imdur  Dyslipidemia: Continue with statin  Protein-calorie malnutrition, severe: Continue with supplements.   TODAY-DAY OF DISCHARGE:  Subjective:   Brandon Sharp today has no headache,no chest abdominal pain,no new weakness tingling or numbness, feels much better wants to go home today.   Objective:   Blood pressure 131/65, pulse 92, temperature 98.2 F (36.8 C), temperature source Oral, resp. rate 18, height 6' (1.829 m), weight 71.8 kg (158 lb 4.6 oz), SpO2 92 %.  Intake/Output Summary (Last 24 hours) at 05/11/14 1124 Last data filed at 05/10/14 1800  Gross per 24 hour  Intake  454.5 ml  Output      0 ml  Net  454.5 ml   Filed Weights   05/09/14 2045 05/10/14 2108 05/11/14 0800  Weight: 71.396 kg (157 lb 6.4 oz) 71.668 kg (158 lb) 71.8 kg (158 lb 4.6 oz)    Exam Awake Alert, Oriented *3, No new F.N deficits, Normal affect [place].AT,PERRAL Supple Neck,No JVD, No cervical lymphadenopathy appriciated.  Symmetrical Chest wall movement, Good air movement bilaterally,  CTAB RRR,No Gallops,Rubs or new Murmurs, No Parasternal Heave +ve B.Sounds, Abd Soft, Non tender, No organomegaly appriciated, No rebound -guarding or rigidity. No Cyanosis, Clubbing or edema, No new Rash or bruise  DISCHARGE CONDITION: Stable  DISPOSITION: Home  DISCHARGE INSTRUCTIONS:    Activity:  As tolerated   Diet recommendation: Diabetic Diet Heart Healthy diet  Discharge Instructions    Call MD for:  redness, tenderness, or signs of infection (pain, swelling, redness, odor or green/yellow discharge around incision site)    Complete by:  As directed      Call MD for:  temperature >100.4    Complete by:  As directed      Diet - low sodium heart healthy    Complete by:  As directed      Diet Carb Modified    Complete by:  As directed      Increase activity slowly    Complete by:  As directed            Follow-up Information    Follow up with [name],[name], MD. Schedule an appointment as soon as possible for a visit in 3 days.   Specialty:  Family Medicine   Why:  for further dosing of coumadin. Please note Hemodialysis center will draw INR and fax it to Dr Fayrene Fearing office.    Contact information:   [place] [phone]       Please follow up.   Contact information:   HD clinic at your usual schedule      Please follow up.   Contact information:   Primary Cardiologist at [facility] call and make appointment in 2 weeks      Total Time spent on discharge equals 45 minutes.  SignedOren Binet 05/11/2014 11:24 AM

## 2014-05-11 NOTE — Progress Notes (Signed)
[facility] Progress Note  Assessment/Plan: 1. FUO- now afebrile, extensive workup during previous admissions, followed by ID, BCs no growth to date, CT concerning for multifocal PNA with LUL pulmonary nodule, RUA occlusive DVT; Vancomycin & Cefepime started 12/11. Fevers resolved on IV abx. ID suspecting possible septic thrombophlebitis as cause of fevers.Per pharm noted 12/15 Vanc stopped by ID and Cefepime changed to invanz with comment to d/c on Augmentin- nothing on med list this am- asked pharmacy to clarify this 2. RUE DVT - Heparin gtt/warfarin per pharm, oral anticoagulation, s/p R arm fistulogram with venoplasty of R subclavian & R innominate veins per Dr. Bridgett Larsson 3. ESRD - HD on MWF @ [facility],  4. HTN/Volume - BP 145/53 on Carvedilol; goal 2.5 today BP 140s 5. Anemia - Hgb 7.9 , Aranesp 200 mcg on Mon. 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 7. HX heart transplant - on immunosuppressive therapy. 8. DM - per primary. 9. CAD - Hx MI, on Imdur, BB, ASA, statin 10. Thrombocytopenia -mild 110s 11. Dispo - poss dc after HD if he can get home lovenox- need to clarify who will monitor- PCP is Anastasia Pall  Myriam Jacobson, PA-C [place] 05/11/2014,9:15 AM  LOS: 6 days   Pt seen, examined and agree w A/P as above.  Kelly Splinter MD pager [phone]    cell [phone] 05/11/2014, 2:21 PM    Subjective:   No c/o Objective Filed Vitals:   05/11/14 0630 05/11/14 0800 05/11/14 0815 05/11/14 0843  BP: 124/54 155/71 138/67 134/64  Pulse: 84 95 83 80  Temp: 97.2 F (36.2 C) 98.2 F (36.8 C)    TempSrc:  Oral    Resp: 18 18    Height:      Weight:  71.8 kg (158 lb 4.6 oz)    SpO2: 95% 92%     Physical Exam General: NAD Heart: RRR Lungs: no rales/wheezes Abdomen: soft NT Extremities: no LE edema; right upper arm mild swelling Dialysis Access: right AVF  Dialysis Orders: MWF [facility] 71.5 kgs 2K/2.25ca 4hr  3000 Heparin R AVF 400/1.5  hectorol 1 mcg IV/HD Aranesp 60 q wends Venofer 100 x5- stop 12/11  Additional Objective Labs: Lab Results  Component Value Date   INR 1.12 05/11/2014   INR 1.13 05/06/2014   INR 1.01 30/86/5784   Basic Metabolic Panel:  Recent Labs Lab 05/06/14 1205 05/09/14 0432 05/09/14 0644 05/11/14 0823  NA 135* 137 135* 138  K 3.9 4.2 4.2 3.3*  CL 94* 99 98 99  CO2 26 22 22 23   GLUCOSE 169* 142* 141* 87  BUN 24* 39* 40* 27*  CREATININE 6.60* 6.88* 6.93* 6.27*  CALCIUM 8.5 8.6 8.7 8.9  PHOS 2.3  --  2.9 2.3   Liver Function Tests:  Recent Labs Lab 05/05/14 1726 05/06/14 1205 05/09/14 0644 05/11/14 0823  AST 28  --   --   --   ALT 9  --   --   --   ALKPHOS 56  --   --   --   BILITOT 0.4  --   --   --   PROT 5.9*  --   --   --   ALBUMIN 2.3* 2.0* 2.1* 2.2*   CBC:  Recent Labs Lab 05/05/14 1726  05/07/14 0442 05/08/14 0500 05/09/14 0500 05/10/14 0534 05/11/14 0600  WBC 4.5  < > 4.0 2.8* 3.0* 3.3* 3.0*  NEUTROABS 2.6  --   --   --   --   --   --  HGB 9.1*  < > 8.4* 7.6* 7.5* 8.1* 7.9*  HCT 29.8*  < > 26.9* 24.5* 24.2* 26.2* 24.8*  MCV 92.5  < > 92.4 91.4 90.6 92.3 92.9  PLT 121*  < > 99* 96* 100* 118* 114*  < > = values in this interval not displayed. Blood Culture    Component Value Date/Time   SDES BLOOD LEFT ARM 05/06/2014 1650   SPECREQUEST BOTTLES DRAWN AEROBIC AND ANAEROBIC 10CC 05/06/2014 1650   CULT  05/06/2014 1650           BLOOD CULTURE RECEIVED NO GROWTH TO DATE CULTURE WILL BE HELD FOR 5 DAYS BEFORE ISSUING A FINAL NEGATIVE REPORT Performed at [place] PENDING 05/06/2014 1650   CBG:  Recent Labs Lab 05/09/14 2043 05/10/14 0838 05/10/14 1219 05/10/14 1650 05/10/14 2110  GLUCAP 178* 98 180* 249* 204*  Medications: . heparin 950 Units/hr (05/10/14 0440)   . aspirin EC  81 mg Oral Daily  . atorvastatin  40 mg Oral Daily  . azaTHIOprine  75 mg Oral q morning - 10a  . calcium  acetate  667 mg Oral TID WC  . carvedilol  6.25 mg Oral QHS  . colesevelam  1,875 mg Oral BID WC  . darbepoetin (ARANESP) injection - DIALYSIS  200 mcg Intravenous Q Mon-HD  . doxercalciferol  1 mcg Intravenous Q M,W,F-HD  . famotidine  10 mg Oral QHS  . insulin aspart  0-9 Units Subcutaneous TID WC  . insulin glargine  10 Units Subcutaneous q morning - 10a  . isosorbide dinitrate  40 mg Oral TID WC  . multivitamin  1 tablet Oral QHS  . predniSONE  10 mg Oral Q breakfast  . sirolimus  2 mg Oral Daily  . sodium chloride  3 mL Intravenous Q12H  . warfarin   Does not apply Once  . Warfarin - Pharmacist Dosing Inpatient   Does not apply [phone]

## 2014-05-11 NOTE — Discharge Instructions (Signed)

## 2014-05-11 NOTE — Progress Notes (Signed)
ANTICOAGULATION CONSULT NOTE - Followup Consult  Pharmacy Consult for Warfarin/Heparin  Indication: DVT, right IJ  Allergies  Allergen Reactions  . Lisinopril Swelling    Lips and tongue swell  . Niacin And Related Other (See Comments)    unknown  . Norvasc [Amlodipine Besylate] Rash    Flushing  . Penicillins Rash    Patient Measurements: Height: 6' (182.9 cm) Weight: 158 lb 4.6 oz (71.8 kg) (Standing) IBW/kg (Calculated) : 77.6  Vital Signs: Temp: 98.2 F (36.8 C) (12/16 0800) Temp Source: Oral (12/16 0800) BP: 146/70 mmHg (12/16 1015) Pulse Rate: 84 (12/16 1015)  Labs:  Recent Labs  05/09/14 0432  05/09/14 0500 05/09/14 0644  05/10/14 0534 05/10/14 0539 05/10/14 1414 05/11/14 0600 05/11/14 0820 05/11/14 0823  HGB  --   < > 7.5*  --   --  8.1*  --   --  7.9*  --   --   HCT  --   --  24.2*  --   --  26.2*  --   --  24.8*  --   --   PLT  --   --  100*  --   --  118*  --   --  114*  --   --   LABPROT  --   --   --   --   --   --   --   --   --  14.5  --   INR  --   --   --   --   --   --   --   --   --  1.12  --   HEPARINUNFRC  --   --  0.78*  --   < >  --  0.41 0.65  --  0.59  --   CREATININE 6.88*  --   --  6.93*  --   --   --   --   --   --  6.27*  < > = values in this interval not displayed.  Estimated Creatinine Clearance: 11.3 mL/min (by C-G formula based on Cr of 6.27).   Medical History: Past Medical History  Diagnosis Date  . Diabetes mellitus   . Hypertension   . Myocardial infarction 1985; 1990  . Angina   . Coronary artery disease   . Dysrhythmia   . DVT (deep venous thrombosis) ~ 12/2010    LLE  . Anemia   . Blood transfusion 06/1999    post heart transplant  . Peripheral vascular disease   . CHF (congestive heart failure) 2001    beffore transplant  . Renal failure     Hemodialysis MWF last 2 years, sse dr [name] nephrology, goes to [facility]   Assessment: Pt on heparin for right IJ DVT, started warfarin on  12/15 and rec 5mg  x1. Heparin has been therapeutic (HL 0.59 on 12/16). Pt's insurance will cover Lovenox. Will DC heparin today, pt to be discharged on Lovenox bridge and warfarin. Baseline INR is 1.13, hgb/plt low and stable for several days.   Goal of Therapy:  INR 2-3 Monitor platelets by anticoagulation protocol: Yes   Plan:  -Start Lovenox 70mg  sq daily -Warfarin 5mg  PO x 1 tonight -Daily INR -Monitor for bleeding  Megan E. Supple, Pharm.D Clinical Pharmacy Resident Pager: [phone] 05/11/2014 10:24 AM

## 2014-05-12 LAB — FUNGAL ANTIBODIES PANEL, ID-BLOOD
Aspergillus Flavus Antibodies: NEGATIVE
Aspergillus Niger Antibodies: NEGATIVE
Aspergillus fumigatus: NEGATIVE
Blastomyces Abs, Qn, DID: NEGATIVE
COCCIDIOIDES ANTIBODY ID: NEGATIVE
Histoplasma Ab, Immunodiffusion: NEGATIVE

## 2014-05-12 LAB — RENAL FUNCTION PANEL
ALBUMIN: 2.2 g/dL — AB (ref 3.5–5.2)
Anion gap: 12 (ref 5–15)
BUN: 12 mg/dL (ref 6–23)
CHLORIDE: 101 meq/L (ref 96–112)
CO2: 28 meq/L (ref 19–32)
Calcium: 9 mg/dL (ref 8.4–10.5)
Creatinine, Ser: 3.99 mg/dL — ABNORMAL HIGH (ref 0.50–1.35)
GFR, EST AFRICAN AMERICAN: 16 mL/min — AB (ref >90)
GFR, EST NON AFRICAN AMERICAN: 14 mL/min — AB (ref >90)
Glucose, Bld: 71 mg/dL (ref 70–99)
POTASSIUM: 4 meq/L (ref 3.7–5.3)
Phosphorus: 2 mg/dL — ABNORMAL LOW (ref 2.3–4.6)
SODIUM: 141 meq/L (ref 137–147)

## 2014-05-12 LAB — CULTURE, BLOOD (ROUTINE X 2)
CULTURE: NO GROWTH
CULTURE: NO GROWTH
Culture: NO GROWTH

## 2014-05-12 LAB — PROTIME-INR
INR: 1.17 (ref 0.00–1.49)
PROTHROMBIN TIME: 15 s (ref 11.6–15.2)

## 2014-05-12 LAB — GLUCOSE, CAPILLARY
GLUCOSE-CAPILLARY: 109 mg/dL — AB (ref 70–99)
Glucose-Capillary: 109 mg/dL — ABNORMAL HIGH (ref 70–99)
Glucose-Capillary: 66 mg/dL — ABNORMAL LOW (ref 70–99)
Glucose-Capillary: 68 mg/dL — ABNORMAL LOW (ref 70–99)
Glucose-Capillary: 162 mg/dL — ABNORMAL HIGH (ref 70–99)

## 2014-05-12 LAB — CBC
HCT: 27.8 % — ABNORMAL LOW (ref 39.0–52.0)
Hemoglobin: 8.4 g/dL — ABNORMAL LOW (ref 13.0–17.0)
MCH: 28.3 pg (ref 26.0–34.0)
MCHC: 30.2 g/dL (ref 30.0–36.0)
MCV: 93.6 fL (ref 78.0–100.0)
PLATELETS: 135 10*3/uL — AB (ref 150–400)
RBC: 2.97 MIL/uL — AB (ref 4.22–5.81)
RDW: 17.3 % — ABNORMAL HIGH (ref 11.5–15.5)
WBC: 3.4 10*3/uL — ABNORMAL LOW (ref 4.0–10.5)

## 2014-05-12 NOTE — Progress Notes (Signed)
Patient Discharge: Disposition: Patient discharged home with the neighbor taking him home. Education: Patient educated about coumadin and Lovenox injection.  Pharmacist talked to him about Coumadin, patient got the coumadin guide book.  Patient able to tell the signs of bleeding, therapeutic INR value, when to take the medicine and what type of diet he should be on.  Patient was also educated on Lovenox injection, how to take it...patient already knew to take insulin injections.  Educated upon prescriptions, medications, diet, follow-up appointment and also discharge instructions. IV: Discontinued IV before discharge. Transportation: Patient transported in w/c with the volunteer accompanying till his ride. Belongings: patient took all his belongings with him.

## 2014-05-12 NOTE — Progress Notes (Signed)
[facility] Progress Note  Assessment/Plan: 1. FUO- DID have a fever spike to 99.8 yesterday afternoon-? Relation to transient confusion yesterday, extensive workup during previous admissions, followed by ID, BCs no growth to date, CT chest concerning for multifocal PNA with LUL pulmonary nodule, RUA occlusive DVT; Vancomycin & Cefepime started 12/11. Fevers resolved on IV abx. ID suspecting possible septic thrombophlebitis as cause of fevers.Per pharm noted 12/15 Vanc stopped by ID and Cefepime changed to Kelfex yesterday; afebrile this am 2. AMS post HD 12/16 - Head CT neg acute; fever spike yesterday - no cultures done- afebrile this am and back to baseline; he attributes to possible low BS, but that doesn't explain ^ temp; BP and O2 sats were ok yesterday 3. RUE DVT - Heparin gtt/warfarin per pharm, oral anticoagulation, s/p R arm fistulogram with venoplasty of R subclavian & R innominate veins per Dr. Bridgett Larsson; discussed timing of lovenox bINR 1.17 - slow responder to coumain 4. ESRD - HD on MWF @ [facility] - his HD center has been notified that he will be there tomorrow 5. HTN/Volume - BP 120s on Carvedilol;  6. Anemia - Hgb 7.9 Wed up to 8.4 today post HD, Aranesp 200 mcg on Mon. 7. Sec HPT - Ca 9 P 2 - suspect low due to food in hospital corrected Ca 10.6; Hectorol 1 mcg, Phoslo with meals.- no change for d/c 8. HX heart transplant - on immunosuppressive therapy. 9. DM - per primary. 10. CAD - Hx MI, on Imdur, BB, ASA, statin 11. Thrombocytopenia -mild 110s improved to 130s today 12. Dispo - plan d/c today; HD to draw INR Friday and fax to  PCP/ Anastasia Pall- due to holiday dialysis schedule - it may make fore sense for him to get INRs drawn at Dr. Fayrene Fearing office on Monday (he will have HD Sunday);will ask his HD center to sort this out tomorrow  Myriam Jacobson, PA-C [place] [phone] 05/12/2014,10:16 AM  LOS: 7 days    Pt seen, examined and agree  w A/P as above.  Kelly Splinter MD pager [phone]    cell [phone] 05/12/2014, 11:40 AM     Subjective:   Worried about remembering to take Lovenox after d/c; doesn't remember much of yesterday except that he was confused; contributes to missing breakfast yesterday and then didn't eat much the rest of the day; initiatialy woke up this am and thought it was his HD day; fell back to sleep and then awoke and was well oriented and at feeling fine;  Objective Filed Vitals:   05/11/14 1827 05/11/14 2122 05/12/14 0423 05/12/14 1002  BP: 124/58 125/56 120/46 131/55  Pulse: 105 98 87 92  Temp: 99.2 F (37.3 C) 98.4 F (36.9 C) 98.5 F (36.9 C) 98.1 F (36.7 C)  TempSrc: Oral Oral Oral Oral  Resp:  19 17 18   Height:      Weight:      SpO2: 96% 95% 97% 96%   Physical Exam General: NAD Heart: RRR Lungs: no rales Abdomen: soft Extremities: no edema Dialysis Access:  Right AVF  Dialysis Orders: MWF [facility] 71.5 kgs 2K/2.25ca 4hr 3000 Heparin R AVF 400/1.5  hectorol 1 mcg IV/HD Aranesp 60 q wends Venofer 100 x5- stop 12/11   Additional Objective Labs: Lab Results  Component Value Date   INR 1.17 05/12/2014   INR 1.12 05/11/2014   INR 1.13 14/97/0263    Basic Metabolic Panel:  Recent Labs Lab 05/09/14 0644 05/11/14 0823 05/12/14 0525  NA 135* 138 141  K 4.2 3.3* 4.0  CL 98 99 101  CO2 22 23 28   GLUCOSE 141* 87 71  BUN 40* 27* 12  CREATININE 6.93* 6.27* 3.99*  CALCIUM 8.7 8.9 9.0  PHOS 2.9 2.3 2.0*   Liver Function Tests:  Recent Labs Lab 05/05/14 1726  05/09/14 0644 05/11/14 0823 05/12/14 0525  AST 28  --   --   --   --   ALT 9  --   --   --   --   ALKPHOS 56  --   --   --   --   BILITOT 0.4  --   --   --   --   PROT 5.9*  --   --   --   --   ALBUMIN 2.3*  < > 2.1* 2.2* 2.2*  < > = values in this interval not displayed. No results for input(s): LIPASE, AMYLASE in the last 168 hours. CBC:  Recent Labs Lab 05/05/14 1726  05/08/14 0500  05/09/14 0500 05/10/14 0534 05/11/14 0600 05/12/14 0525  WBC 4.5  < > 2.8* 3.0* 3.3* 3.0* 3.4*  NEUTROABS 2.6  --   --   --   --   --   --   HGB 9.1*  < > 7.6* 7.5* 8.1* 7.9* 8.4*  HCT 29.8*  < > 24.5* 24.2* 26.2* 24.8* 27.8*  MCV 92.5  < > 91.4 90.6 92.3 92.9 93.6  PLT 121*  < > 96* 100* 118* 114* 135*  < > = values in this interval not displayed. Blood Culture    Component Value Date/Time   SDES BLOOD LEFT ARM 05/06/2014 1650   SPECREQUEST BOTTLES DRAWN AEROBIC AND ANAEROBIC 10CC 05/06/2014 1650   CULT  05/06/2014 1650    NO GROWTH 5 DAYS Performed at [facility] 05/12/2014 FINAL 05/06/2014 1650   CBG:  Recent Labs Lab 05/11/14 1326 05/11/14 1701 05/11/14 2118 05/12/14 0732 05/12/14 0748  GLUCAP 80 109* 109* 66* 68*   Studies/Results: Ct Head Wo Contrast  05/11/2014   CLINICAL DATA:  Confusion after hemodialysis. Patient is on anticoagulation.  EXAM: CT HEAD WITHOUT CONTRAST  TECHNIQUE: Contiguous axial images were obtained from the base of the skull through the vertex without contrast.  COMPARISON:  MRI 04/14/2014 and head CT 04/06/2014  FINDINGS: Old insult or infarct in the left cerebellar hemisphere. Stable cerebral atrophy. No evidence for acute hemorrhage, mass lesion, midline shift, hydrocephalus or large new infarct. Vertebral artery atherosclerotic calcifications. Again noted is chronic opacification of the left maxillary sinus. No acute bone abnormality. Mild mucosal disease in the right maxillary sinus. Nasal septal deviation towards the right side. There is mucosal disease in the left anterior ethmoid air cells.  IMPRESSION: No acute intracranial abnormality.  Stable atrophy.  Old left cerebellar infarct.  Chronic sinus disease, particularly in the left maxillary sinus.   Electronically Signed   By: Markus Daft M.D.   On: 05/11/2014 16:48   Medications:   . aspirin EC  81 mg Oral Daily  . atorvastatin  40 mg Oral Daily  . azaTHIOprine  75  mg Oral q morning - 10a  . calcium acetate  667 mg Oral TID WC  . carvedilol  6.25 mg Oral QHS  . cephALEXin  250 mg Oral Q12H  . colesevelam  1,875 mg Oral BID WC  . darbepoetin (ARANESP) injection - DIALYSIS  200 mcg Intravenous Q Mon-HD  . doxercalciferol  1 mcg Intravenous Q M,W,F-HD  . enoxaparin (LOVENOX) injection  70 mg Subcutaneous Q24H  . famotidine  10 mg Oral QHS  . insulin aspart  0-9 Units Subcutaneous TID WC  . insulin glargine  10 Units Subcutaneous q morning - 10a  . isosorbide dinitrate  40 mg Oral TID WC  . multivitamin  1 tablet Oral QHS  . predniSONE  10 mg Oral Q breakfast  . sirolimus  2 mg Oral Daily  . sodium chloride  3 mL Intravenous Q12H  . warfarin   Does not apply Once  . Warfarin - Pharmacist Dosing Inpatient   Does not apply [phone]

## 2014-05-12 NOTE — Progress Notes (Signed)
PATIENT DETAILS Name: Brandon Sharp Age: 68 y.o. Sex: male Date of Birth: 1946-05-24 Admit Date: 05/05/2014 Admitting Physician Orvan Falconer, MD ONG:[id number],XLKGMWN C, MD  Subjective: Discharge held yesterday as patient developed transient confusion-completely awake and alert this morning. He is anxious to go home. No major events overnight.  Assessment/Plan: Principal Problem:   Fever of unknown origin (FUO):ongoing for 4-6 weeks-has had 2 recent hospitalizations for the same issue, so far extensive workup including blood cultures, urine cultures, bronchial with negative AFB and fungal smears so far, question multifocal pneumonia on CT scan. Further workup has demonstrated a right IJ DVT and also has significant swelling of his right arm. Fevers suspected to be from septic thrombophlebitis. Infectious disease -now recommending (spoke with Dr Linus Salmons over the phone on 12/16) recommended transition to Keflex on discharge. He remains on on anticoagulation with overlapping Lovenox/Coumadin for right IJ DVT. Please see discharge summary for details  Active Problems:  Right IJ DVT: Currently on IV heparin, given swelling of his right upper extremity-seen by vascular surgery, underwent for a fistulogram of his right upper extremity with venoplasty of right subclavian vein and right innominate vein on 05/09/14. Some decrease in the right arm swelling. Remains on overlapping Lovenox/Coumadin, and has completed Lovenox teaching, Coumadin teaching also provided. Patient has been on Coumadin in the past and seems very familiar with it.Interestingly he has had a hx of prior DVT, suspect he may need life long anticoagulation  Heart transplant, failed kidney transplant: Continue home immunosuppressants. Follow with transplant clinic post discharge.  Anemia:suspect secondary to acute illness on chronic anemia. Follow CBC   Thrombocytopenia:suspet secondary to acute illness-watch closely as on Heparin,  if drops significantly will send out HIT panel.Platelets low but slowly increasing.   ESRD: On Monday Wednesday Friday schedule. Renal consulted.   DM (diabetes mellitus), type 2 with renal complications: CBGs stable, continue with SSI.Restart low dose Lantus   Hypertension: Continue with Coreg, Imdur   Dyslipidemia: Continue with statin   Protein-calorie malnutrition, severe: Continue with supplements.  Disposition: Remain inpatient-Home today.  Antibiotics: See below   Anti-infectives    Start     Dose/Rate Route Frequency Ordered Stop   05/11/14 1100  cephALEXin (KEFLEX) capsule 250 mg     250 mg Oral Every 12 hours 05/11/14 1012     05/11/14 0000  cephALEXin (KEFLEX) 250 MG capsule     250 mg Oral Every 12 hours 05/11/14 1112     05/09/14 1200  vancomycin (VANCOCIN) 750 mg in sodium chloride 0.9 % 150 mL IVPB  Status:  Discontinued     750 mg150 mL/hr over 60 Minutes Intravenous Every M-W-F (Hemodialysis) 05/06/14 1639 05/10/14 1721   05/06/14 2000  ceFEPIme (MAXIPIME) 2 g in dextrose 5 % 50 mL IVPB  Status:  Discontinued     2 g100 mL/hr over 30 Minutes Intravenous Every M-W-F (2000) 05/06/14 1641 05/10/14 1721   05/06/14 1645  vancomycin (VANCOCIN) 1,250 mg in sodium chloride 0.9 % 250 mL IVPB     1,250 mg166.7 mL/hr over 90 Minutes Intravenous  Once 05/06/14 1639 05/06/14 1901      DVT Prophylaxis: IV Heparin  Code Status: Full code   Family Communication None at bedside  Procedures:  None  CONSULTS:  ID, nephrology and vascular surgery  MEDICATIONS: Scheduled Meds: . aspirin EC  81 mg Oral Daily  . atorvastatin  40 mg Oral Daily  . azaTHIOprine  75 mg Oral q  morning - 10a  . calcium acetate  667 mg Oral TID WC  . carvedilol  6.25 mg Oral QHS  . cephALEXin  250 mg Oral Q12H  . colesevelam  1,875 mg Oral BID WC  . darbepoetin (ARANESP) injection - DIALYSIS  200 mcg Intravenous Q Mon-HD  . doxercalciferol  1 mcg Intravenous Q M,W,F-HD  . enoxaparin  (LOVENOX) injection  70 mg Subcutaneous Q24H  . famotidine  10 mg Oral QHS  . insulin aspart  0-9 Units Subcutaneous TID WC  . insulin glargine  10 Units Subcutaneous q morning - 10a  . isosorbide dinitrate  40 mg Oral TID WC  . multivitamin  1 tablet Oral QHS  . predniSONE  10 mg Oral Q breakfast  . sirolimus  2 mg Oral Daily  . sodium chloride  3 mL Intravenous Q12H  . warfarin   Does not apply Once  . Warfarin - Pharmacist Dosing Inpatient   Does not apply q1800   Continuous Infusions:   PRN Meds:.sodium chloride, sodium chloride, sodium chloride, [DISCONTINUED] acetaminophen **OR** acetaminophen, acetaminophen, feeding supplement (NEPRO CARB STEADY), heparin, lidocaine (PF), lidocaine-prilocaine, ondansetron (ZOFRAN) IV, pentafluoroprop-tetrafluoroeth, sodium chloride    PHYSICAL EXAM: Vital signs in last 24 hours: Filed Vitals:   05/11/14 1827 05/11/14 2122 05/12/14 0423 05/12/14 1002  BP: 124/58 125/56 120/46 131/55  Pulse: 105 98 87 92  Temp: 99.2 F (37.3 C) 98.4 F (36.9 C) 98.5 F (36.9 C) 98.1 F (36.7 C)  TempSrc: Oral Oral Oral Oral  Resp:  19 17 18   Height:      Weight:      SpO2: 96% 95% 97% 96%    Weight change: 0.132 kg (4.6 oz) Filed Weights   05/10/14 2108 05/11/14 0800 05/11/14 1211  Weight: 71.668 kg (158 lb) 71.8 kg (158 lb 4.6 oz) 69.9 kg (154 lb 1.6 oz)   Body mass index is 20.9 kg/(m^2).   Gen Exam: Awake and alert with clear speech.   Neck: Supple, No JVD.   Chest: B/L Clear. No rales or rhonchi  CVS: S1 S2 Regular, no murmurs.  Abdomen: soft, BS +, non tender, non distended.  Extremities: no edema, lower extremities warm to touch.decrease in RUE swelling Neurologic: Non Focal.   Skin: No Rash.   Wounds: N/A.    Intake/Output from previous day:  Intake/Output Summary (Last 24 hours) at 05/12/14 1103 Last data filed at 05/12/14 1003  Gross per 24 hour  Intake    303 ml  Output   1872 ml  Net  -1569 ml     LAB  RESULTS: CBC  Recent Labs Lab 05/05/14 1726  05/08/14 0500 05/09/14 0500 05/10/14 0534 05/11/14 0600 05/12/14 0525  WBC 4.5  < > 2.8* 3.0* 3.3* 3.0* 3.4*  HGB 9.1*  < > 7.6* 7.5* 8.1* 7.9* 8.4*  HCT 29.8*  < > 24.5* 24.2* 26.2* 24.8* 27.8*  PLT 121*  < > 96* 100* 118* 114* 135*  MCV 92.5  < > 91.4 90.6 92.3 92.9 93.6  MCH 28.3  < > 28.4 28.1 28.5 29.6 28.3  MCHC 30.5  < > 31.0 31.0 30.9 31.9 30.2  RDW 16.5*  < > 16.5* 16.3* 16.6* 16.8* 17.3*  LYMPHSABS 1.4  --   --   --   --   --   --   MONOABS 0.3  --   --   --   --   --   --   EOSABS 0.1  --   --   --   --   --   --  BASOSABS 0.0  --   --   --   --   --   --   < > = values in this interval not displayed.  Chemistries   Recent Labs Lab 05/06/14 1205 05/09/14 0432 05/09/14 0644 05/11/14 0823 05/12/14 0525  NA 135* 137 135* 138 141  K 3.9 4.2 4.2 3.3* 4.0  CL 94* 99 98 99 101  CO2 26 22 22 23 28   GLUCOSE 169* 142* 141* 87 71  BUN 24* 39* 40* 27* 12  CREATININE 6.60* 6.88* 6.93* 6.27* 3.99*  CALCIUM 8.5 8.6 8.7 8.9 9.0    CBG:  Recent Labs Lab 05/11/14 1326 05/11/14 1701 05/11/14 2118 05/12/14 0732 05/12/14 0748  GLUCAP 80 109* 109* 66* 68*    GFR Estimated Creatinine Clearance: 17.3 mL/min (by C-G formula based on Cr of 3.99).  Coagulation profile  Recent Labs Lab 05/06/14 1830 05/11/14 0820 05/12/14 0525  INR 1.13 1.12 1.17    Cardiac Enzymes No results for input(s): CKMB, TROPONINI, MYOGLOBIN in the last 168 hours.  Invalid input(s): CK  Invalid input(s): POCBNP No results for input(s): DDIMER in the last 72 hours. No results for input(s): HGBA1C in the last 72 hours. No results for input(s): CHOL, HDL, LDLCALC, TRIG, CHOLHDL, LDLDIRECT in the last 72 hours. No results for input(s): TSH, T4TOTAL, T3FREE, THYROIDAB in the last 72 hours.  Invalid input(s): FREET3 No results for input(s): VITAMINB12, FOLATE, FERRITIN, TIBC, IRON, RETICCTPCT in the last 72 hours. No results for input(s):  LIPASE, AMYLASE in the last 72 hours.  Urine Studies No results for input(s): UHGB, CRYS in the last 72 hours.  Invalid input(s): UACOL, UAPR, USPG, UPH, UTP, UGL, UKET, UBIL, UNIT, UROB, ULEU, UEPI, UWBC, URBC, UBAC, CAST, UCOM, BILUA  MICROBIOLOGY: Recent Results (from the past 240 hour(s))  Culture, blood (routine x 2)     Status: None   Collection Time: 05/05/14  8:44 PM  Result Value Ref Range Status   Specimen Description BLOOD ARM LEFT  Final   Special Requests BOTTLES DRAWN AEROBIC AND ANAEROBIC 10CC  Final   Culture  Setup Time   Final    05/06/2014 00:38 Performed at [place]   Final    NO GROWTH 5 DAYS Performed at [facility]    Report Status 05/12/2014 FINAL  Final  Culture, blood (routine x 2)     Status: None   Collection Time: 05/05/14  8:51 PM  Result Value Ref Range Status   Specimen Description BLOOD WRIST LEFT  Final   Special Requests BOTTLES DRAWN AEROBIC AND ANAEROBIC 5CC  Final   Culture  Setup Time   Final    05/06/2014 00:39 Performed at [place]   Final    NO GROWTH 5 DAYS Performed at [facility]    Report Status 05/12/2014 FINAL  Final  MRSA PCR Screening     Status: None   Collection Time: 05/06/14 12:19 AM  Result Value Ref Range Status   MRSA by PCR NEGATIVE NEGATIVE Final    Comment:        The GeneXpert MRSA Assay (FDA approved for NASAL specimens only), is one component of a comprehensive MRSA colonization surveillance program. It is not intended to diagnose MRSA infection nor to guide or monitor treatment for MRSA infections.   Culture, blood (routine x 2)     Status: None   Collection Time: 05/06/14  4:50 PM  Result Value Ref Range Status   Specimen Description BLOOD LEFT ARM  Final   Special Requests BOTTLES DRAWN AEROBIC AND ANAEROBIC 10CC  Final   Culture  Setup Time   Final    05/06/2014 22:41 Performed at [place]   Final    NO  GROWTH 5 DAYS Performed at [facility]    Report Status 05/12/2014 FINAL  Final    RADIOLOGY STUDIES/RESULTS: Dg Chest 2 View  05/05/2014   CLINICAL DATA:  Fever of 102. Vomiting started today. Cough for 3 months. History of heart transplant in 2001. Diabetes, hypertension, CHF. Dialysis. CABG x3.  EXAM: CHEST  2 VIEW  COMPARISON:  04/14/2014  FINDINGS: Status post median sternotomy. Heart is enlarged. Stents are identified in the upper chest. There has been some improvement in aeration of the lung bases. Patchy densities persist however. No definite pleural effusions. No pulmonary edema.  IMPRESSION: Cardiomegaly. Persistent bilateral airspace filling opacities with some improvement in aeration since prior studies.   Electronically Signed   By: Shon Hale M.D.   On: 05/05/2014 21:00   Ct Head Wo Contrast  05/11/2014   CLINICAL DATA:  Confusion after hemodialysis. Patient is on anticoagulation.  EXAM: CT HEAD WITHOUT CONTRAST  TECHNIQUE: Contiguous axial images were obtained from the base of the skull through the vertex without contrast.  COMPARISON:  MRI 04/14/2014 and head CT 04/06/2014  FINDINGS: Old insult or infarct in the left cerebellar hemisphere. Stable cerebral atrophy. No evidence for acute hemorrhage, mass lesion, midline shift, hydrocephalus or large new infarct. Vertebral artery atherosclerotic calcifications. Again noted is chronic opacification of the left maxillary sinus. No acute bone abnormality. Mild mucosal disease in the right maxillary sinus. Nasal septal deviation towards the right side. There is mucosal disease in the left anterior ethmoid air cells.  IMPRESSION: No acute intracranial abnormality.  Stable atrophy.  Old left cerebellar infarct.  Chronic sinus disease, particularly in the left maxillary sinus.   Electronically Signed   By: Markus Daft M.D.   On: 05/11/2014 16:48   Ct Chest Wo Contrast  04/13/2014   CLINICAL DATA:  68 year old immunocompromised male  with intermittent fever. Initial encounter. Current history of renal failure status post renal transplant and transplant project.  EXAM: CT CHEST WITHOUT CONTRAST  TECHNIQUE: Multidetector CT imaging of the chest was performed following the standard protocol without IV contrast.  COMPARISON:  Chest radiograph 04/11/2014 and earlier. Chest CT 04/07/2014  FINDINGS: Improved lung volumes. Decreased dependent atelectasis in both lungs, however, confluent peribronchovascular opacity persists in both lower lobes in the lingula, with central air bronchograms. See series 3. The right middle lobe is spared. The right upper lobe is spared. The superior aspect of the left upper lobe is spared.  Only trace associated left pleural fluid. No pericardial effusion. Stable cardiomegaly. No hilar or mediastinal lymphadenopathy. No axillary lymphadenopathy.  Gynecomastia. Stable visualized upper abdominal viscera. Aortic ectasia and calcified atherosclerosis re- identified. Coronary artery stents and atherosclerosis. Sequelae of median sternotomy. Right subclavian venous vascular stent re- identified.  No acute osseous abnormality identified.  IMPRESSION: 1. Improved lung volumes with resolved dependent atelectasis, but persistent confluent bilateral lower lobe and lingula peribronchovascular consolidation. Favor bronchopneumonia in this setting. Noninfectious considerations such as post transplant lymphoproliferative disorder are felt less likely. 2. Trace left pleural fluid. 3. Otherwise stable chest and upper abdominal viscera compared to 04/07/2014.   Electronically Signed   By: Lars Pinks M.D.   On:  04/13/2014 06:00   Ct Chest W Contrast  05/06/2014   CLINICAL DATA:  68 year old male with fever of unknown origin  EXAM: CT CHEST, ABDOMEN, AND PELVIS WITH CONTRAST  TECHNIQUE: Multidetector CT imaging of the chest, abdomen and pelvis was performed following the standard protocol during bolus administration of intravenous  contrast.  CONTRAST:  61mL OMNIPAQUE IOHEXOL 300 MG/ML  SOLN  COMPARISON:  04/13/2014  FINDINGS: CT CHEST FINDINGS  Heart/mediastinum: Median sternotomy wires are present. The heart is mildly enlarged. Scattered atherosclerotic aortic and coronary artery calcifications are present. There is no pericardial effusion. There is mild aneurysmal dilatation of the ascending aorta measured 3.9 cm. There is no aortic dissection. Several collateral vessels are noted within the soft tissues of the left lateral chest. An unchanged 7 mm left upper lobe pulmonary nodule is present (series 2, image 32).  There are no pathologically axillary lymph nodes.  Chest: Upper lobe predominant emphysematous changes are present. Similar appearing bibasilar and linear patchy areas of airspace consolidation is noted. Interstitial thickening is also present. Small bilateral pleural effusions are present. There is no pneumothorax upper lobe predominant centrilobular and paraseptal emphysematous changes are present. No suspicious pulmonary nodules or masses are identified. A few scattered calcified pulmonary nodules are noted.  Gynecomastia is noted.  CT ABDOMEN AND PELVIS FINDINGS  Liver: A 2.2 cm right and left hepatic lobe cysts are noted. Additional subcentimeter hypodense renal lesions are too small to fully characterize.  Gallbladder: There is no cholelithiasis or biliary sludge. There is no pericholecystic fluid. There is no abnormal gallbladder wall thickening. There is no intrahepatic or extrahepatic biliary ductal dilatation.  Spleen: Unremarkable.  Pancreas: Unremarkable.  Adrenal glands: Unremarkable.  Kidneys: Multiple hypodense lesions of varying sizes are seen noted throughout the kidneys, compatible with cysts. Renal cortical thinning is also present bilaterally. There is no hydronephrosis. Several tiny nonobstructing calcific densities are noted bilaterally. A right lower quadrant renal transplant is present.   Bowel/gastrointestinal tract: There is colonic diverticulosis without definite evidence for acute diverticulitis. There is no abnormal bowel wall thickening. There is no evidence of bowel obstruction  Pelvis: There is no pelvic mass. The urinary bladder is decompressed. The prostate gland is mildly enlarged and measures approximately 5.1 x 2.5 cm.  Miscellaneous: Extensive vascular calcifications are present. There is mild ectasia of the abdominal aorta.  Osseous structures: Multilevel degenerative changes of the spine are present. Degenerative changes of the hips are also noted. No acute osseous abnormality is identified.  IMPRESSION: 1. Findings remain concerning for multifocal pneumonia. 2. Colonic diverticulosis without definite evidence for acute diverticulitis. 3. Chronic medical renal disease. 4. Prostatic enlargement. 5. Aneurysmal dilatation of the ascending aorta. 6. Cardiomegaly. 7. 7 mm left upper lobe pulmonary nodule. If the patient is at high risk for bronchogenic carcinoma, follow-up chest CT at 3-67months is recommended. If the patient is at low risk for bronchogenic carcinoma, follow-up chest CT at 6-12 months is recommended. This recommendation follows the consensus statement: Guidelines for Management of Small Pulmonary Nodules Detected on CT Scans: A Statement from the [place] as published in Radiology 2005; 237:395-400. 8. Probable superimposed mild interstitial edema and trace bilateral pleural effusions.   Electronically Signed   By: Rosemarie Ax   On: 05/06/2014 08:39   Mr Brain Wo Contrast  04/14/2014   CLINICAL DATA:  68 year old male with immunosuppression following organ transplantation presents with symptoms of fever, malaise, and encephalopathy. Initial encounter.  EXAM: MRI HEAD WITHOUT CONTRAST  TECHNIQUE: Multiplanar, multiecho  pulse sequences of the brain and surrounding structures were obtained without intravenous contrast.  COMPARISON:  CT head 04/06/2014.  MR  head 05/15/2003.  FINDINGS: There is a 1 cm sized area of diffusion restriction in the RIGHT occipital subcortical white matter with slight associated T2 and FLAIR prolongation. No other similar lesions. No acute hemorrhage is present, although is small focus chronic hemorrhage was noted in 2004 in the RIGHT posterior temporal subcortical white matter, with additional tiny foci of chronic hemorrhage in the RIGHT temporal and LEFT frontal cortex.  Generalized atrophy. Multiple BILATERAL chronic cerebellar infarcts. BILATERAL deep nuclei chronic lacunar infarcts. Minor changes of subcortical and periventricular white matter signal abnormality, likely chronic microvascular ischemic change.  Normal pituitary and cerebellar tonsils. Advanced cervical spondylosis with suspected slight cord compression.  Flow voids are maintained in the carotid, basilar, and vertebral arteries.  Abnormalities of the extracranial soft tissues include significant fluid accumulation with probable retention cyst formation the LEFT maxillary sinus causing expansion, and BILATERAL T2 hyperintense fluid in the mastoid air cells, favored to represent benign effusions. No sinus air-fluid level. Negative orbits. Extracranial soft tissues unremarkable.  No contrast was given in this patient renal failure, but there are no obvious features of pachymeningeal thickening or sulcal FLAIR hyperintensity to suggest meningitis.  IMPRESSION: Nonspecific 1 cm focus of restricted diffusion in the RIGHT occipital subcortical white matter. The patient has evidence for multiple remote areas of infarction and certainly this is a leading differential consideration.  In the setting of immunosuppression, with the patient on Imuran, neurotoxicity secondary to this drug is associated with abnormal diffusion signal in the posterior circulation, including occipital cortex and white matter. Similar findings were seen on prior MR from 2004, but were more extensive at that  time.  Cerebritis or brain abscess, well also associated with restricted diffusion, is not favored based on lesion morphology and lack of surrounding vasogenic edema. Intracranial infection is not favored as a cause of the patient's fever. In addition, LEFT maxillary sinus disease and BILATERAL mastoid effusions are likely non-worrisome.   Electronically Signed   By: Rolla Flatten M.D.   On: 04/14/2014 15:44   Ct Abdomen Pelvis W Contrast  05/06/2014   CLINICAL DATA:  68 year old male with fever of unknown origin  EXAM: CT CHEST, ABDOMEN, AND PELVIS WITH CONTRAST  TECHNIQUE: Multidetector CT imaging of the chest, abdomen and pelvis was performed following the standard protocol during bolus administration of intravenous contrast.  CONTRAST:  18mL OMNIPAQUE IOHEXOL 300 MG/ML  SOLN  COMPARISON:  04/13/2014  FINDINGS: CT CHEST FINDINGS  Heart/mediastinum: Median sternotomy wires are present. The heart is mildly enlarged. Scattered atherosclerotic aortic and coronary artery calcifications are present. There is no pericardial effusion. There is mild aneurysmal dilatation of the ascending aorta measured 3.9 cm. There is no aortic dissection. Several collateral vessels are noted within the soft tissues of the left lateral chest. An unchanged 7 mm left upper lobe pulmonary nodule is present (series 2, image 32).  There are no pathologically axillary lymph nodes.  Chest: Upper lobe predominant emphysematous changes are present. Similar appearing bibasilar and linear patchy areas of airspace consolidation is noted. Interstitial thickening is also present. Small bilateral pleural effusions are present. There is no pneumothorax upper lobe predominant centrilobular and paraseptal emphysematous changes are present. No suspicious pulmonary nodules or masses are identified. A few scattered calcified pulmonary nodules are noted.  Gynecomastia is noted.  CT ABDOMEN AND PELVIS FINDINGS  Liver: A 2.2 cm right and left  hepatic lobe  cysts are noted. Additional subcentimeter hypodense renal lesions are too small to fully characterize.  Gallbladder: There is no cholelithiasis or biliary sludge. There is no pericholecystic fluid. There is no abnormal gallbladder wall thickening. There is no intrahepatic or extrahepatic biliary ductal dilatation.  Spleen: Unremarkable.  Pancreas: Unremarkable.  Adrenal glands: Unremarkable.  Kidneys: Multiple hypodense lesions of varying sizes are seen noted throughout the kidneys, compatible with cysts. Renal cortical thinning is also present bilaterally. There is no hydronephrosis. Several tiny nonobstructing calcific densities are noted bilaterally. A right lower quadrant renal transplant is present.  Bowel/gastrointestinal tract: There is colonic diverticulosis without definite evidence for acute diverticulitis. There is no abnormal bowel wall thickening. There is no evidence of bowel obstruction  Pelvis: There is no pelvic mass. The urinary bladder is decompressed. The prostate gland is mildly enlarged and measures approximately 5.1 x 2.5 cm.  Miscellaneous: Extensive vascular calcifications are present. There is mild ectasia of the abdominal aorta.  Osseous structures: Multilevel degenerative changes of the spine are present. Degenerative changes of the hips are also noted. No acute osseous abnormality is identified.  IMPRESSION: 1. Findings remain concerning for multifocal pneumonia. 2. Colonic diverticulosis without definite evidence for acute diverticulitis. 3. Chronic medical renal disease. 4. Prostatic enlargement. 5. Aneurysmal dilatation of the ascending aorta. 6. Cardiomegaly. 7. 7 mm left upper lobe pulmonary nodule. If the patient is at high risk for bronchogenic carcinoma, follow-up chest CT at 3-67months is recommended. If the patient is at low risk for bronchogenic carcinoma, follow-up chest CT at 6-12 months is recommended. This recommendation follows the consensus statement: Guidelines for  Management of Small Pulmonary Nodules Detected on CT Scans: A Statement from the [place] as published in Radiology 2005; 237:395-400. 8. Probable superimposed mild interstitial edema and trace bilateral pleural effusions.   Electronically Signed   By: Rosemarie Ax   On: 05/06/2014 08:39   Dg Chest Port 1 View  05/08/2014   CLINICAL DATA:  Oxygen desaturation.  EXAM: PORTABLE CHEST - 1 VIEW  COMPARISON:  May 05, 2014.  FINDINGS: Stable cardiomegaly. Sternotomy wires are again noted. Bilateral subclavian vascular stents are again noted. No pneumothorax is noted. Increased bibasilar opacities are noted concerning for worsening edema or pneumonia. Minimal bilateral pleural effusions are noted. Bony thorax is intact.  IMPRESSION: Increased bibasilar opacities are noted concerning for worsening edema or possibly pneumonia. Minimal bilateral pleural effusions are noted as well.   Electronically Signed   By: Sabino Dick M.D.   On: 05/08/2014 18:06   Dg Chest Port 1 View  04/14/2014   CLINICAL DATA:  Status post left bronchoscopy lung biopsy.  EXAM: PORTABLE CHEST - 1 VIEW  COMPARISON:  04/11/2014  FINDINGS: Persistent bibasilar pulmonary airspace opacity again seen, left side greater than right. No evidence of pneumothorax. Cardiomegaly is stable. Bilateral subclavian stents again seen in place. Patient has undergone previous median sternotomy.  IMPRESSION: No significant change in bibasilar airspace disease and cardiomegaly. No pneumothorax identified.   Electronically Signed   By: Earle Gell M.D.   On: 04/14/2014 12:32   Dg C-arm Bronchoscopy  04/14/2014   CLINICAL DATA:    C-ARM BRONCHOSCOPY  Fluoroscopy was utilized by the requesting physician.  No radiographic  interpretation.     Oren Binet, MD  [facility] Pager:[phone]  If 7PM-7AM, please contact night-coverage [email or web address] Password [email or web address] 05/12/2014, 11:03 AM   LOS: 7 days

## 2014-05-24 ENCOUNTER — Other Ambulatory Visit: Payer: Self-pay | Admitting: Internal Medicine

## 2014-05-29 LAB — AFB CULTURE WITH SMEAR (NOT AT ARMC): Acid Fast Smear: NONE SEEN

## 2014-05-30 ENCOUNTER — Telehealth: Payer: Self-pay | Admitting: Emergency Medicine

## 2014-05-30 NOTE — Telephone Encounter (Signed)
Spoke with Marzetta Board from [place]. At ov on 04/26/14 pt was ordered to have repeat chest ct in January 2016.  Pt was in hospital and had chest ct on 05/06/14.  Is it ok to cancel ct scheduled for tomorrow?

## 2014-05-31 ENCOUNTER — Inpatient Hospital Stay: Admission: RE | Admit: 2014-05-31 | Payer: Medicare Other | Source: Ambulatory Visit

## 2014-05-31 ENCOUNTER — Ambulatory Visit (INDEPENDENT_AMBULATORY_CARE_PROVIDER_SITE_OTHER)
Admission: RE | Admit: 2014-05-31 | Discharge: 2014-05-31 | Disposition: A | Discharge status: Home / Self Care | Payer: Medicare Other | Source: Ambulatory Visit | Attending: Emergency Medicine | Admitting: Emergency Medicine

## 2014-05-31 DIAGNOSIS — J189 Pneumonia, unspecified organism: Secondary | ICD-10-CM

## 2014-05-31 NOTE — Telephone Encounter (Signed)
Dr. Lamonte Sakai please advise if pt will need another CT scan-pt is scheduled for CT wo contrast today (05/31/14) at 4pm. Pt had CT chest with contrast on 05/06/14.   Patient Instructions     We will plan to repeat your CT scan of the chest in January 2016 Follow with Dr. Lamonte Sakai in January after the CT to review

## 2014-05-31 NOTE — Telephone Encounter (Signed)
I reviewed the recent hospitalization notes, both prior CT scans. His infiltrates are still present, in fact possibly worse on 12/11. I believe he still should have the repeat CT today as long as this is feasible (and prior-authorized). He doesn't need contrast. He will likely need a repeat procedure - either another bronchoscopy or a VATS biopsy.

## 2014-05-31 NOTE — Telephone Encounter (Signed)
Brandon Sharp calling back about the same.  Patient is calling also asking her if he needs to come in for CT. Pls advise asap.  [phone]

## 2014-05-31 NOTE — Telephone Encounter (Signed)
Pt and Marzetta Board are aware that RB wants CT done. Appointment has been moved to 4pm today. Nothing further was needed.

## 2014-06-09 ENCOUNTER — Ambulatory Visit: Payer: Medicare Other | Admitting: Internal Medicine

## 2014-06-21 ENCOUNTER — Ambulatory Visit (INDEPENDENT_AMBULATORY_CARE_PROVIDER_SITE_OTHER): Payer: Medicare Other | Admitting: Internal Medicine

## 2014-06-21 VITALS — BP 154/78 | HR 87 | Temp 97.7°F | Ht 72.0 in | Wt 159.0 lb

## 2014-06-21 DIAGNOSIS — I809 Phlebitis and thrombophlebitis of unspecified site: Secondary | ICD-10-CM

## 2014-06-21 DIAGNOSIS — J189 Pneumonia, unspecified organism: Secondary | ICD-10-CM

## 2014-06-21 NOTE — Progress Notes (Signed)
Patient ID: Brandon Sharp, male   DOB: 12/14/1945, 69 y.o.   MRN: [id number]         [facility]  Patient Active Problem List   Diagnosis Date Noted  . Protein-calorie malnutrition, severe 05/06/2014  . FUO (fever of unknown origin) 05/05/2014  . S/P bronchoscopy with biopsy   . Fever   . Healthcare associated bacterial pneumonia 04/11/2014  . Immunosuppressed status   . Kidney transplant failure   . Floaters in visual field   . Fever, unknown origin 04/06/2014  . Fever of undetermined origin 04/06/2014  . Sepsis 03/30/2014  . Blood poisoning   . ESRD on dialysis   . Left lower lobe pneumonia   . Personal history of colonic polyps 12/14/2013  . HCAP (healthcare-associated pneumonia) 07/24/2013  . ESRD on hemodialysis 07/24/2013  . Diarrhea 07/24/2013  . Generalized weakness 07/24/2013  . Pain in limb- Left leg 11/24/2012  . Atherosclerosis of native arteries of the extremities with intermittent claudication 11/24/2012  . Fever of unknown origin (FUO) 04/16/2011  . HTN (hypertension), malignant 04/16/2011  . DM (diabetes mellitus), type 2 with renal complications 19/37/9024  . CKD (chronic kidney disease), stage V 04/16/2011  . DVT (deep venous thrombosis) 04/15/2011  . Coagulopathy 04/15/2011  . Heart transplanted 04/15/2011  . Renal transplant failure and rejection 04/15/2011  . Ventricular fibrillation 04/15/2011    Class: History of  . S/P CABG (coronary artery bypass graft) 04/15/2011    Class: History of  . Dyslipidemia 04/15/2011    Patient's Medications  New Prescriptions   No medications on file  Previous Medications   ASPIRIN EC 81 MG TABLET    Take 81 mg by mouth daily.     ATORVASTATIN (LIPITOR) 40 MG TABLET    Take 40 mg by mouth daily.     AZATHIOPRINE (IMURAN) 50 MG TABLET    Take 75 mg by mouth every morning.    B COMPLEX-VITAMIN C-FOLIC ACID (NEPHRO-VITE) 0.8 MG TABS TABLET    Take 1 tablet by mouth at bedtime.   CALCIUM ACETATE (PHOSLO) 667 MG CAPSULE    Take 667 mg by mouth 3 (three) times daily with meals.    CARVEDILOL (COREG) 6.25 MG TABLET    Take 1 tablet (6.25 mg total) by mouth at bedtime.   COLESEVELAM (WELCHOL) 625 MG TABLET    Take 3,750 mg by mouth daily. Takes 6 tabs daily   ENOXAPARIN (LOVENOX) 40 MG/0.4ML INJECTION    Inject 0.7 mLs (70 mg total) into the skin daily.   FAMOTIDINE (PEPCID AC) 10 MG CHEWABLE TABLET    Chew 10 mg by mouth at bedtime.    HUMALOG KWIKPEN 100 UNIT/ML SOPN    Inject 6-10 Units into the skin 2 (two) times daily. Per sliding scale   INSULIN GLARGINE (LANTUS) 100 UNIT/ML INJECTION    Inject 20 Units into the skin every morning.   ISOSORBIDE DINITRATE (ISORDIL) 40 MG TABLET    Take 40 mg by mouth 3 (three) times daily.     NUTRITIONAL SUPPLEMENTS (FEEDING SUPPLEMENT, NEPRO CARB STEADY,) LIQD    Take 237 mLs by mouth as needed (missed meal during dialysis.).   PREDNISONE (DELTASONE) 10 MG TABLET    Take 1 tablet (10 mg total) by mouth daily with breakfast.   SIROLIMUS (RAPAMUNE) 2 MG TABLET    Take 2 mg by mouth daily.   WARFARIN (COUMADIN) 5 MG TABLET    Take 1 tablet (5 mg total) by mouth  daily.  Modified Medications   No medications on file  Discontinued Medications   CEPHALEXIN (KEFLEX) 250 MG CAPSULE    Take 1 capsule (250 mg total) by mouth every 12 (twelve) hours. 14 more days from 05/11/14    Subjective: Mr. Bidinger is in with his wife for his hospital follow-up visit. He has been hospitalized 4 times recently with fever. When he was rehospitalized in mid December he was found to have an acute DVT involving the right internal jugular vein. Blood cultures were negative but it was assumed that he probably had been having septic thrombophlebitis causing his recurrent fevers and pneumonia. While hospitalized he was treated with vancomycin and cefepime. He was discharged on 05/11/2014 on oral cephalexin and took that for 2 weeks. He is now been off of antibiotics  for about 4 weeks. He states that he is feeling much better. He has had no recurrent fever. He only has minimal residual cough productive of white sputum. A follow-up CT scan on 05/31/2014 showed resolving bibasilar pneumonia.  Review of Systems: Pertinent items are noted in HPI.  Past Medical History  Diagnosis Date  . Diabetes mellitus   . Hypertension   . Myocardial infarction 1985; 1990  . Angina   . Coronary artery disease   . Dysrhythmia   . DVT (deep venous thrombosis) ~ 12/2010    LLE  . Anemia   . Blood transfusion 06/1999    post heart transplant  . Peripheral vascular disease   . CHF (congestive heart failure) 2001    beffore transplant  . Renal failure     Hemodialysis MWF last 2 years, sse dr [name] nephrology, goes to [facility]    History  Substance Use Topics  . Smoking status: Former Smoker -- 1.00 packs/day for 20 years    Types: Cigarettes    Quit date: 05/27/1988  . Smokeless tobacco: Never Used  . Alcohol Use: Yes     Comment: "Holidays"    Family History  Problem Relation Age of Onset  . Heart disease Mother   . Hyperlipidemia Mother   . Hypertension Mother   . Hyperlipidemia Father   . Hypertension Father   . Diabetes Brother   . Heart disease Brother     Heart Disease before age 21  . Hyperlipidemia Brother   . Hypertension Brother     Allergies  Allergen Reactions  . Lisinopril Swelling    Lips and tongue swell  . Niacin And Related Other (See Comments)    unknown  . Norvasc [Amlodipine Besylate] Rash    Flushing  . Penicillins Rash    Objective: Temp: 97.7 F (36.5 C) (01/26 1010) Temp Source: Oral (01/26 1010) BP: 154/78 mmHg (01/26 1010) Pulse Rate: 87 (01/26 1010)  General: Smiling and in good spirits Skin: No rash Lungs: Clear Cor: Regular S1 and S2 with an early 1/6 systolic murmur Diffuse right arm edema and distention of the right external jugular vein  CT CHEST WITHOUT CONTRAST  05/31/2014 TECHNIQUE: Multidetector CT imaging of the chest was performed following the standard protocol without IV contrast.  COMPARISON: 05/06/2014.   IMPRESSION: 1. Interval improved aeration of both lungs compatible with resolving pneumonia. 2. Emphysema 3. Cardiac enlargement. Previous CABG procedure.   Electronically Signed  By: Kerby Moors M.D.  On: 05/31/2014 17:09  Assessment: I am hopeful that Mr. Brawn septic thrombophlebitis has now resolved. He will stay on warfarin but I will observe off of antibiotics for now. He knows  to call me immediately if he starts having recurrent fever.  Plan: 1. Continue observation off of antibiotics 2. Follow-up in 6 weeks   Michel Bickers, MD [facility] [place] [phone] pager   [phone] cell 06/21/2014, 10:33 AM

## 2014-07-07 ENCOUNTER — Other Ambulatory Visit: Payer: Self-pay

## 2014-07-07 ENCOUNTER — Encounter (HOSPITAL_COMMUNITY): Payer: Self-pay | Admitting: Pharmacy Technician

## 2014-07-14 ENCOUNTER — Encounter (HOSPITAL_COMMUNITY): Admission: RE | Payer: Self-pay | Source: Ambulatory Visit

## 2014-07-14 ENCOUNTER — Ambulatory Visit (HOSPITAL_COMMUNITY): Admission: RE | Admit: 2014-07-14 | Payer: Medicare Other | Source: Ambulatory Visit | Admitting: Vascular Surgery

## 2014-07-14 SURGERY — ASSESSMENT, SHUNT FUNCTION, WITH CONTRAST RADIOGRAPHIC STUDY
Anesthesia: LOCAL

## 2014-08-09 ENCOUNTER — Ambulatory Visit: Payer: Medicare Other | Admitting: Internal Medicine

## 2014-08-25 ENCOUNTER — Emergency Department (HOSPITAL_COMMUNITY): Payer: Medicare Other

## 2014-08-25 ENCOUNTER — Other Ambulatory Visit (HOSPITAL_COMMUNITY): Payer: Self-pay

## 2014-08-25 ENCOUNTER — Encounter (HOSPITAL_COMMUNITY): Payer: Self-pay | Admitting: *Deleted

## 2014-08-25 ENCOUNTER — Inpatient Hospital Stay (HOSPITAL_COMMUNITY)
Admission: EM | Admit: 2014-08-25 | Discharge: 2014-08-29 | DRG: 602 | Disposition: A | Discharge status: Home / Self Care | Payer: Medicare Other | Attending: Internal Medicine | Admitting: Internal Medicine

## 2014-08-25 DIAGNOSIS — Z880 Allergy status to penicillin: Secondary | ICD-10-CM

## 2014-08-25 DIAGNOSIS — D649 Anemia, unspecified: Secondary | ICD-10-CM | POA: Diagnosis not present

## 2014-08-25 DIAGNOSIS — I82403 Acute embolism and thrombosis of unspecified deep veins of lower extremity, bilateral: Secondary | ICD-10-CM | POA: Diagnosis not present

## 2014-08-25 DIAGNOSIS — E876 Hypokalemia: Secondary | ICD-10-CM | POA: Diagnosis present

## 2014-08-25 DIAGNOSIS — Z941 Heart transplant status: Secondary | ICD-10-CM

## 2014-08-25 DIAGNOSIS — I120 Hypertensive chronic kidney disease with stage 5 chronic kidney disease or end stage renal disease: Secondary | ICD-10-CM | POA: Diagnosis present

## 2014-08-25 DIAGNOSIS — I252 Old myocardial infarction: Secondary | ICD-10-CM

## 2014-08-25 DIAGNOSIS — L03114 Cellulitis of left upper limb: Secondary | ICD-10-CM | POA: Diagnosis present

## 2014-08-25 DIAGNOSIS — T8612 Kidney transplant failure: Secondary | ICD-10-CM | POA: Diagnosis present

## 2014-08-25 DIAGNOSIS — N2581 Secondary hyperparathyroidism of renal origin: Secondary | ICD-10-CM | POA: Diagnosis present

## 2014-08-25 DIAGNOSIS — I25811 Atherosclerosis of native coronary artery of transplanted heart without angina pectoris: Secondary | ICD-10-CM | POA: Diagnosis present

## 2014-08-25 DIAGNOSIS — Z992 Dependence on renal dialysis: Secondary | ICD-10-CM

## 2014-08-25 DIAGNOSIS — I82622 Acute embolism and thrombosis of deep veins of left upper extremity: Secondary | ICD-10-CM | POA: Diagnosis not present

## 2014-08-25 DIAGNOSIS — T451X5A Adverse effect of antineoplastic and immunosuppressive drugs, initial encounter: Secondary | ICD-10-CM | POA: Diagnosis present

## 2014-08-25 DIAGNOSIS — I509 Heart failure, unspecified: Secondary | ICD-10-CM | POA: Diagnosis present

## 2014-08-25 DIAGNOSIS — M7989 Other specified soft tissue disorders: Secondary | ICD-10-CM | POA: Diagnosis not present

## 2014-08-25 DIAGNOSIS — Z951 Presence of aortocoronary bypass graft: Secondary | ICD-10-CM

## 2014-08-25 DIAGNOSIS — Z7901 Long term (current) use of anticoagulants: Secondary | ICD-10-CM | POA: Diagnosis not present

## 2014-08-25 DIAGNOSIS — I739 Peripheral vascular disease, unspecified: Secondary | ICD-10-CM | POA: Diagnosis present

## 2014-08-25 DIAGNOSIS — Z955 Presence of coronary angioplasty implant and graft: Secondary | ICD-10-CM

## 2014-08-25 DIAGNOSIS — I82409 Acute embolism and thrombosis of unspecified deep veins of unspecified lower extremity: Secondary | ICD-10-CM | POA: Diagnosis present

## 2014-08-25 DIAGNOSIS — I82B12 Acute embolism and thrombosis of left subclavian vein: Secondary | ICD-10-CM | POA: Diagnosis present

## 2014-08-25 DIAGNOSIS — I82C11 Acute embolism and thrombosis of right internal jugular vein: Secondary | ICD-10-CM | POA: Diagnosis present

## 2014-08-25 DIAGNOSIS — L0390 Cellulitis, unspecified: Secondary | ICD-10-CM | POA: Diagnosis present

## 2014-08-25 DIAGNOSIS — Z888 Allergy status to other drugs, medicaments and biological substances status: Secondary | ICD-10-CM

## 2014-08-25 DIAGNOSIS — D638 Anemia in other chronic diseases classified elsewhere: Secondary | ICD-10-CM | POA: Diagnosis present

## 2014-08-25 DIAGNOSIS — D6959 Other secondary thrombocytopenia: Secondary | ICD-10-CM | POA: Diagnosis present

## 2014-08-25 DIAGNOSIS — Z79899 Other long term (current) drug therapy: Secondary | ICD-10-CM | POA: Diagnosis not present

## 2014-08-25 DIAGNOSIS — D696 Thrombocytopenia, unspecified: Secondary | ICD-10-CM | POA: Diagnosis not present

## 2014-08-25 DIAGNOSIS — M79603 Pain in arm, unspecified: Secondary | ICD-10-CM

## 2014-08-25 DIAGNOSIS — Z87891 Personal history of nicotine dependence: Secondary | ICD-10-CM | POA: Diagnosis not present

## 2014-08-25 DIAGNOSIS — I82623 Acute embolism and thrombosis of deep veins of upper extremity, bilateral: Secondary | ICD-10-CM | POA: Diagnosis present

## 2014-08-25 DIAGNOSIS — Z794 Long term (current) use of insulin: Secondary | ICD-10-CM | POA: Diagnosis not present

## 2014-08-25 DIAGNOSIS — N186 End stage renal disease: Secondary | ICD-10-CM

## 2014-08-25 DIAGNOSIS — I82722 Chronic embolism and thrombosis of deep veins of left upper extremity: Secondary | ICD-10-CM | POA: Diagnosis not present

## 2014-08-25 DIAGNOSIS — E11649 Type 2 diabetes mellitus with hypoglycemia without coma: Secondary | ICD-10-CM | POA: Diagnosis present

## 2014-08-25 DIAGNOSIS — I255 Ischemic cardiomyopathy: Secondary | ICD-10-CM | POA: Diagnosis not present

## 2014-08-25 DIAGNOSIS — Z86718 Personal history of other venous thrombosis and embolism: Secondary | ICD-10-CM

## 2014-08-25 HISTORY — DX: Personal history of other venous thrombosis and embolism: Z86.718

## 2014-08-25 LAB — COMPREHENSIVE METABOLIC PANEL
ALT: 16 U/L (ref 0–53)
AST: 29 U/L (ref 0–37)
Albumin: 3 g/dL — ABNORMAL LOW (ref 3.5–5.2)
Alkaline Phosphatase: 82 U/L (ref 39–117)
Anion gap: 15 (ref 5–15)
BILIRUBIN TOTAL: 0.8 mg/dL (ref 0.3–1.2)
BUN: 31 mg/dL — ABNORMAL HIGH (ref 6–23)
CALCIUM: 9.2 mg/dL (ref 8.4–10.5)
CHLORIDE: 96 mmol/L (ref 96–112)
CO2: 28 mmol/L (ref 19–32)
Creatinine, Ser: 6.6 mg/dL — ABNORMAL HIGH (ref 0.50–1.35)
GFR calc Af Amer: 9 mL/min — ABNORMAL LOW (ref >90)
GFR, EST NON AFRICAN AMERICAN: 8 mL/min — AB (ref >90)
GLUCOSE: 126 mg/dL — AB (ref 70–99)
Potassium: 3.6 mmol/L (ref 3.5–5.1)
Sodium: 139 mmol/L (ref 135–145)
Total Protein: 6.2 g/dL (ref 6.0–8.3)

## 2014-08-25 LAB — CBC WITH DIFFERENTIAL/PLATELET
BASOS PCT: 0 % (ref 0–1)
Basophils Absolute: 0 10*3/uL (ref 0.0–0.1)
EOS ABS: 0 10*3/uL (ref 0.0–0.7)
EOS PCT: 0 % (ref 0–5)
HCT: 37.7 % — ABNORMAL LOW (ref 39.0–52.0)
Hemoglobin: 11.9 g/dL — ABNORMAL LOW (ref 13.0–17.0)
LYMPHS ABS: 1.7 10*3/uL (ref 0.7–4.0)
Lymphocytes Relative: 21 % (ref 12–46)
MCH: 30.7 pg (ref 26.0–34.0)
MCHC: 31.6 g/dL (ref 30.0–36.0)
MCV: 97.2 fL (ref 78.0–100.0)
Monocytes Absolute: 0.7 10*3/uL (ref 0.1–1.0)
Monocytes Relative: 9 % (ref 3–12)
Neutro Abs: 5.7 10*3/uL (ref 1.7–7.7)
Neutrophils Relative %: 70 % (ref 43–77)
PLATELETS: 87 10*3/uL — AB (ref 150–400)
RBC: 3.88 MIL/uL — AB (ref 4.22–5.81)
RDW: 17 % — ABNORMAL HIGH (ref 11.5–15.5)
WBC: 8.1 10*3/uL (ref 4.0–10.5)

## 2014-08-25 LAB — I-STAT CHEM 8, ED
BUN: 32 mg/dL — AB (ref 6–23)
CALCIUM ION: 1.08 mmol/L — AB (ref 1.13–1.30)
Chloride: 97 mmol/L (ref 96–112)
Creatinine, Ser: 6.3 mg/dL — ABNORMAL HIGH (ref 0.50–1.35)
Glucose, Bld: 128 mg/dL — ABNORMAL HIGH (ref 70–99)
HCT: 38 % — ABNORMAL LOW (ref 39.0–52.0)
Hemoglobin: 12.9 g/dL — ABNORMAL LOW (ref 13.0–17.0)
Potassium: 3.5 mmol/L (ref 3.5–5.1)
Sodium: 137 mmol/L (ref 135–145)
TCO2: 24 mmol/L (ref 0–100)

## 2014-08-25 LAB — GLUCOSE, CAPILLARY: Glucose-Capillary: 73 mg/dL (ref 70–99)

## 2014-08-25 LAB — I-STAT CG4 LACTIC ACID, ED
LACTIC ACID, VENOUS: 1.04 mmol/L (ref 0.5–2.0)
Lactic Acid, Venous: 1.95 mmol/L (ref 0.5–2.0)

## 2014-08-25 LAB — LIPASE, BLOOD: Lipase: 34 U/L (ref 11–59)

## 2014-08-25 MED ORDER — CALCIUM ACETATE (PHOS BINDER) 667 MG PO CAPS
667.0000 mg | ORAL_CAPSULE | Freq: Three times a day (TID) | ORAL | Status: DC
  Administered 2014-08-26 – 2014-08-29 (×10): 667 mg via ORAL
  Filled 2014-08-25 (×12): qty 1

## 2014-08-25 MED ORDER — VANCOMYCIN HCL IN DEXTROSE 1-5 GM/200ML-% IV SOLN
1000.0000 mg | Freq: Once | INTRAVENOUS | Status: AC
  Administered 2014-08-25: 1000 mg via INTRAVENOUS
  Filled 2014-08-25: qty 200

## 2014-08-25 MED ORDER — SODIUM CHLORIDE 0.9 % IV SOLN: 250.0000 mL | INTRAVENOUS | Status: DC | PRN

## 2014-08-25 MED ORDER — INSULIN ASPART 100 UNIT/ML ~~LOC~~ SOLN: 0.0000 [IU] | Freq: Every day | SUBCUTANEOUS | Status: DC

## 2014-08-25 MED ORDER — SODIUM CHLORIDE 0.9 % IJ SOLN: 3.0000 mL | INTRAMUSCULAR | Status: DC | PRN

## 2014-08-25 MED ORDER — DEXTROSE 5 % IV SOLN
2.0000 g | Freq: Once | INTRAVENOUS | Status: AC
  Administered 2014-08-26: 2 g via INTRAVENOUS
  Filled 2014-08-25: qty 2

## 2014-08-25 MED ORDER — AZATHIOPRINE 50 MG PO TABS
75.0000 mg | ORAL_TABLET | Freq: Every morning | ORAL | Status: DC
  Administered 2014-08-26 – 2014-08-29 (×4): 75 mg via ORAL
  Filled 2014-08-25 (×4): qty 2

## 2014-08-25 MED ORDER — FAMOTIDINE 10 MG PO TABS
10.0000 mg | ORAL_TABLET | Freq: Every day | ORAL | Status: DC
  Administered 2014-08-26 – 2014-08-28 (×4): 10 mg via ORAL
  Filled 2014-08-25 (×7): qty 1

## 2014-08-25 MED ORDER — SODIUM CHLORIDE 0.9 % IV SOLN
INTRAVENOUS | Status: DC
Start: 2014-08-25 — End: 2014-08-26
  Administered 2014-08-25 – 2014-08-26 (×2): via INTRAVENOUS

## 2014-08-25 MED ORDER — SODIUM CHLORIDE 0.9 % IJ SOLN
3.0000 mL | Freq: Two times a day (BID) | INTRAMUSCULAR | Status: DC
  Administered 2014-08-27 – 2014-08-28 (×3): 3 mL via INTRAVENOUS

## 2014-08-25 MED ORDER — SIROLIMUS 1 MG PO TABS: 2.0000 mg | ORAL_TABLET | Freq: Every day | ORAL | Status: DC

## 2014-08-25 MED ORDER — HEPARIN SODIUM (PORCINE) 5000 UNIT/ML IJ SOLN: 4000.0000 [IU] | Freq: Once | INTRAMUSCULAR | Status: DC

## 2014-08-25 MED ORDER — COLESEVELAM HCL 625 MG PO TABS
3750.0000 mg | ORAL_TABLET | Freq: Every day | ORAL | Status: DC
  Administered 2014-08-26 – 2014-08-29 (×4): 3750 mg via ORAL
  Filled 2014-08-25 (×4): qty 6

## 2014-08-25 MED ORDER — INSULIN ASPART 100 UNIT/ML ~~LOC~~ SOLN: 0.0000 [IU] | Freq: Three times a day (TID) | SUBCUTANEOUS | Status: DC

## 2014-08-25 MED ORDER — HEPARIN (PORCINE) IN NACL 100-0.45 UNIT/ML-% IJ SOLN
1200.0000 [IU]/h | INTRAMUSCULAR | Status: DC
  Administered 2014-08-25 – 2014-08-27 (×3): 1200 [IU]/h via INTRAVENOUS
  Filled 2014-08-25 (×5): qty 250

## 2014-08-25 MED ORDER — INSULIN GLARGINE 100 UNIT/ML ~~LOC~~ SOLN
20.0000 [IU] | Freq: Every morning | SUBCUTANEOUS | Status: DC
  Administered 2014-08-26: 20 [IU] via SUBCUTANEOUS
  Filled 2014-08-25 (×2): qty 0.2

## 2014-08-25 MED ORDER — NEPRO/CARBSTEADY PO LIQD: 237.0000 mL | ORAL | Status: DC | PRN

## 2014-08-25 MED ORDER — VANCOMYCIN HCL 500 MG IV SOLR
500.0000 mg | Freq: Once | INTRAVENOUS | Status: AC
  Administered 2014-08-26: 500 mg via INTRAVENOUS
  Filled 2014-08-25: qty 500

## 2014-08-25 MED ORDER — SIROLIMUS 1 MG PO TABS
2.0000 mg | ORAL_TABLET | Freq: Every day | ORAL | Status: DC
  Administered 2014-08-26 – 2014-08-29 (×5): 2 mg via ORAL
  Filled 2014-08-25 (×5): qty 2

## 2014-08-25 MED ORDER — SODIUM CHLORIDE 0.9 % IJ SOLN
3.0000 mL | Freq: Two times a day (BID) | INTRAMUSCULAR | Status: DC
  Administered 2014-08-28: 3 mL via INTRAVENOUS

## 2014-08-25 MED ORDER — ACETAMINOPHEN 650 MG RE SUPP: 650.0000 mg | Freq: Four times a day (QID) | RECTAL | Status: DC | PRN

## 2014-08-25 MED ORDER — PREDNISONE 10 MG PO TABS: 10.0000 mg | ORAL_TABLET | Freq: Every day | ORAL | Status: DC

## 2014-08-25 MED ORDER — HEPARIN (PORCINE) IN NACL 100-0.45 UNIT/ML-% IJ SOLN: 14.0000 [IU]/kg/h | Freq: Once | INTRAMUSCULAR | Status: DC

## 2014-08-25 MED ORDER — CARVEDILOL 6.25 MG PO TABS
6.2500 mg | ORAL_TABLET | Freq: Every day | ORAL | Status: DC
  Administered 2014-08-26 – 2014-08-28 (×4): 6.25 mg via ORAL
  Filled 2014-08-25: qty 1
  Filled 2014-08-25: qty 2
  Filled 2014-08-25 (×2): qty 1

## 2014-08-25 MED ORDER — SODIUM CHLORIDE 0.9 % IV SOLN: INTRAVENOUS | Status: AC

## 2014-08-25 MED ORDER — ACETAMINOPHEN 325 MG PO TABS: 650.0000 mg | ORAL_TABLET | Freq: Four times a day (QID) | ORAL | Status: DC | PRN

## 2014-08-25 MED ORDER — PREDNISONE 10 MG PO TABS
10.0000 mg | ORAL_TABLET | Freq: Every day | ORAL | Status: DC
  Administered 2014-08-26: 10 mg via ORAL
  Filled 2014-08-25 (×3): qty 1

## 2014-08-25 MED ORDER — ISOSORBIDE DINITRATE 20 MG PO TABS
40.0000 mg | ORAL_TABLET | Freq: Three times a day (TID) | ORAL | Status: DC
  Administered 2014-08-26 – 2014-08-29 (×10): 40 mg via ORAL
  Filled 2014-08-25 (×15): qty 2

## 2014-08-25 MED ORDER — AZATHIOPRINE 50 MG PO TABS: 75.0000 mg | ORAL_TABLET | Freq: Every morning | ORAL | Status: DC

## 2014-08-25 MED ORDER — ATORVASTATIN CALCIUM 40 MG PO TABS
40.0000 mg | ORAL_TABLET | Freq: Every day | ORAL | Status: DC
  Administered 2014-08-26 – 2014-08-29 (×4): 40 mg via ORAL
  Filled 2014-08-25 (×5): qty 1

## 2014-08-25 NOTE — Progress Notes (Addendum)
ANTICOAGULATION CONSULT NOTE - Initial Consult  Pharmacy Consult for Heparin  Indication: New DVT + h/o DVT  Allergies  Allergen Reactions  . Lisinopril Swelling    Lips and tongue swell  . Niacin And Related Other (See Comments)    unknown  . Norvasc [Amlodipine Besylate] Rash    Flushing  . Penicillins Rash    Patient Measurements: Height: 6' (182.9 cm) Weight: 159 lb 9.6 oz (72.394 kg) IBW/kg (Calculated) : 77.6 Heparin Dosing Weight: 72 kg   Vital Signs: Temp: 97.7 F (36.5 C) (03/31 1351) Temp Source: Oral (03/31 1351) BP: 126/48 mmHg (03/31 1845) Pulse Rate: 91 (03/31 1845)  Labs:  Recent Labs  08/25/14 1536 08/25/14 1549  HGB 11.9* 12.9*  HCT 37.7* 38.0*  PLT 87*  --   CREATININE 6.60* 6.30*    Estimated Creatinine Clearance: 11.3 mL/min (by C-G formula based on Cr of 6.3).   Medical History: Past Medical History  Diagnosis Date  . Diabetes mellitus   . Hypertension   . Myocardial infarction 1985; 1990  . Angina   . Coronary artery disease   . Dysrhythmia   . DVT (deep venous thrombosis) ~ 12/2010    LLE  . Anemia   . Blood transfusion 06/1999    post heart transplant  . Peripheral vascular disease   . CHF (congestive heart failure) 2001    beffore transplant  . Renal failure     Hemodialysis MWF last 2 years, sse dr [name] nephrology, goes to [facility] kidney center    Medications:   (Not in a hospital admission)  Assessment: 31 YOM with a history of DVTs on Coumadin prior to admission presented to the ED today with left arm pain and swelling. U/S showed L subclavian DVT and nearly occluded RIJ. Pharmacy consulted to start IV heparin.   H/H mildly low, Plt 87 (BL ~ 100); INR ordered.  Goal of Therapy:  Heparin level 0.3-0.7 units/ml Monitor platelets by anticoagulation protocol: Yes   Plan:  -Start heparin infusion at 1200 units/hr. NO BOLUS since patient is on warfarin -F/u stat INR and 8 hr HL  -Monitor daily HL, CBC and  s/s of bleeding -Confirm Coumadin failure vs. Sub-therapeutic INR   Albertina Parr, PharmD., BCPS Clinical Pharmacist Pager [phone]   Addendum: Now adding Vancomycin and Cefepime for empiric coverage of Cellulitis. WBC wnl, Afebrile. Patient has a history of CKD on M/W/F HD. Nephrology consulted. Patient has already received a dose of Vancomycin 1 gm IV.   Cultures: 3/31 Blood Cx x2>>   Plan: -Give additional Vancomycin load dose of 500 mg IV once for total load dose of 1500 mg. F/u HD schedule for maintenance Vanc dose -Cefepime 2 gm now then, 2 gm IV Q HD   -Monitor CBC, cultures and clinical progress  Albertina Parr, PharmD., BCPS Clinical Pharmacist Pager [phone]

## 2014-08-25 NOTE — Progress Notes (Signed)
VASCULAR LAB PRELIMINARY  PRELIMINARY  PRELIMINARY  PRELIMINARY  Bilateral upper extremity venous completed.    Preliminary report:  Partial acute DVT of the Left mid distal subclavian vein.                                 Nearly occluded acute DVT of the Right jugular vein.                                 Report given to ED physician.  August Albino, RVT 08/25/2014, 6:27 PM

## 2014-08-25 NOTE — H&P (Signed)
Brandon Sharp is an 69 y.o. male.    Veronia Beets (pcp) Edrick Oh (nephrology)  Chief Complaint:  HPI: 69 yo male with dm2, hypertension, cad, CHF (EF  ), ESRD on HD (M, W, F) (c/o right arm pain yesterday, and then today felt weak, and left arm was painful, and therefore presented to ED for evaluation.  Left arm was red and warm to touch, and swollen.  Pt denies fever, chills, cp, palp, sob, n/v, diarrhea, brbpr, black stool, lower ext edema. Pt had u/s=> L subclavian DVT and nearly occluded R IJ.  Pt will be admitted for cellulitis of the left arm and DVT.   Past Medical History  Diagnosis Date  . Diabetes mellitus   . Hypertension   . Myocardial infarction 1985; 1990  . Angina   . Coronary artery disease   . Dysrhythmia   . DVT (deep venous thrombosis) ~ 12/2010    LLE  . Anemia   . Blood transfusion 06/1999    post heart transplant  . Peripheral vascular disease   . CHF (congestive heart failure) 2001    beffore transplant  . Renal failure     Hemodialysis MWF last 2 years, sse dr [name] nephrology, goes to [place] [facility]  . DVT (deep venous thrombosis) 08/25/2014    Nearly occluded R IJ, and L subclavian DVT    Past Surgical History  Procedure Laterality Date  . Nephrectomy transplanted organ  2008  . Av fistula repair      rt. a=fore arm fistula  . Av fistula placement  ~ 01/2010    "this was my 2nd fistula placement"  . Heart transplant  06/1999  . Coronary angioplasty with stent placement  1985; 1990  . Colonoscopy  03/17/2012    Procedure: COLONOSCOPY;  Surgeon: Lear Ng, MD;  Location: [facility] ENDOSCOPY;  Service: Endoscopy;  Laterality: N/A;  . Coronary artery bypass graft  1990    CABG X3  . Colonoscopy with propofol N/A 12/14/2013    Procedure: COLONOSCOPY WITH PROPOFOL;  Surgeon: Lear Ng, MD;  Location: [facility] ENDOS[facility];  Service: Endoscopy;  Laterality: N/A;  . Video bronchoscopy Bilateral 04/14/2014    Procedure: VIDEO  BRONCHOSCOPY WITH FLUORO;  Surgeon: Collene Gobble, MD;  Location: [place];  Service: Cardiopulmonary;  Laterality: Bilateral;  . Shuntogram Right 05/09/2014    Procedure: FISTULOGRAM;  Surgeon: Conrad Montpelier, MD;  Location[facility] CATH LAB;  Service: Cardiovascular;  Laterality: Right;    Family History  Problem Relation Age of Onset  . Heart disease Mother   . Hyperlipidemia Mother   . Hypertension Mother   . Hyperlipidemia Father   . Hypertension Father   . Diabetes Brother   . Heart disease Brother     Heart Disease before age 64  . Hyperlipidemia Brother   . Hypertension Brother    Social History:  reports that he quit smoking about 26 years ago. His smoking use included Cigarettes. He has a 20 pack-year smoking history. He has never used smokeless tobacco. He reports that he drinks alcohol. He reports that he does not use illicit drugs.  Allergies:  Allergies  Allergen Reactions  . Lisinopril Swelling    Lips and tongue swell  . Niacin And Related Other (See Comments)    unknown  . Norvasc [Amlodipine Besylate] Rash    Flushing  . Penicillins Rash   Medications reviewed.   (Not in a hospital admission)  Results for orders placed or  performed during the hospital encounter of 08/25/14 (from the past 48 hour(s))  CBC with Differential     Status: Abnormal   Collection Time: 08/25/14  3:36 PM  Result Value Ref Range   WBC 8.1 4.0 - 10.5 K/uL   RBC 3.88 (L) 4.22 - 5.81 MIL/uL   Hemoglobin 11.9 (L) 13.0 - 17.0 g/dL   HCT 37.7 (L) 39.0 - 52.0 %   MCV 97.2 78.0 - 100.0 fL   MCH 30.7 26.0 - 34.0 pg   MCHC 31.6 30.0 - 36.0 g/dL   RDW 17.0 (H) 11.5 - 15.5 %   Platelets 87 (L) 150 - 400 K/uL    Comment: PLATELET COUNT CONFIRMED BY SMEAR   Neutrophils Relative % 70 43 - 77 %   Neutro Abs 5.7 1.7 - 7.7 K/uL   Lymphocytes Relative 21 12 - 46 %   Lymphs Abs 1.7 0.7 - 4.0 K/uL   Monocytes Relative 9 3 - 12 %   Monocytes Absolute 0.7 0.1 - 1.0 K/uL   Eosinophils Relative 0  0 - 5 %   Eosinophils Absolute 0.0 0.0 - 0.7 K/uL   Basophils Relative 0 0 - 1 %   Basophils Absolute 0.0 0.0 - 0.1 K/uL  Comprehensive metabolic panel     Status: Abnormal   Collection Time: 08/25/14  3:36 PM  Result Value Ref Range   Sodium 139 135 - 145 mmol/L   Potassium 3.6 3.5 - 5.1 mmol/L   Chloride 96 96 - 112 mmol/L   CO2 28 19 - 32 mmol/L   Glucose, Bld 126 (H) 70 - 99 mg/dL   BUN 31 (H) 6 - 23 mg/dL   Creatinine, Ser 6.60 (H) 0.50 - 1.35 mg/dL   Calcium 9.2 8.4 - 10.5 mg/dL   Total Protein 6.2 6.0 - 8.3 g/dL   Albumin 3.0 (L) 3.5 - 5.2 g/dL   AST 29 0 - 37 U/L   ALT 16 0 - 53 U/L   Alkaline Phosphatase 82 39 - 117 U/L   Total Bilirubin 0.8 0.3 - 1.2 mg/dL   GFR calc non Af Amer 8 (L) >90 mL/min   GFR calc Af Amer 9 (L) >90 mL/min    Comment: (NOTE) The eGFR has been calculated using the CKD EPI equation. This calculation has not been validated in all clinical situations. eGFR's persistently <90 mL/min signify possible Chronic Kidney Disease.    Anion gap 15 5 - 15  Lipase, blood     Status: None   Collection Time: 08/25/14  3:36 PM  Result Value Ref Range   Lipase 34 11 - 59 U/L  Culture, blood (routine x 2)     Status: None (Preliminary result)   Collection Time: 08/25/14  3:44 PM  Result Value Ref Range   Specimen Description BLOOD LEFT ARM    Special Requests BOTTLES DRAWN AEROBIC AND ANAEROBIC 10CC    Culture PENDING    Report Status PENDING   I-Stat Chem 8, ED     Status: Abnormal   Collection Time: 08/25/14  3:49 PM  Result Value Ref Range   Sodium 137 135 - 145 mmol/L   Potassium 3.5 3.5 - 5.1 mmol/L   Chloride 97 96 - 112 mmol/L   BUN 32 (H) 6 - 23 mg/dL   Creatinine, Ser 6.30 (H) 0.50 - 1.35 mg/dL   Glucose, Bld 128 (H) 70 - 99 mg/dL   Calcium, Ion 1.08 (L) 1.13 - 1.30 mmol/L   TCO2 24 0 -  100 mmol/L   Hemoglobin 12.9 (L) 13.0 - 17.0 g/dL   HCT 38.0 (L) 39.0 - 52.0 %  I-Stat CG4 Lactic Acid, ED     Status: None   Collection Time: 08/25/14   3:49 PM  Result Value Ref Range   Lactic Acid, Venous 1.95 0.5 - 2.0 mmol/L  I-Stat CG4 Lactic Acid, ED     Status: None   Collection Time: 08/25/14  7:22 PM  Result Value Ref Range   Lactic Acid, Venous 1.04 0.5 - 2.0 mmol/L   Dg Chest Port 1 View  08/25/2014   CLINICAL DATA:  Left arm pain.  Fever for 1 day.  EXAM: PORTABLE CHEST - 1 VIEW  COMPARISON:  05/08/2014  FINDINGS: Mild cardiomegaly is stable. Aortic and hilar contours are unchanged. Bilateral central venous stents, new on the right from prior. There is no edema, consolidation, effusion, or pneumothorax.  IMPRESSION: No active disease.   Electronically Signed   By: Monte Fantasia M.D.   On: 08/25/2014 15:55    Review of Systems  Constitutional: Negative.   HENT: Negative.   Eyes: Negative.   Respiratory: Negative.   Cardiovascular: Negative.   Gastrointestinal: Negative.   Genitourinary: Negative.   Musculoskeletal: Negative.   Skin:       + redness left upper ext, + swelling LUE,   Neurological: Negative.   Endo/Heme/Allergies: Negative.   Psychiatric/Behavioral: Negative.     Blood pressure 129/49, pulse 88, temperature 97.7 F (36.5 C), temperature source Oral, resp. rate 11, height 6' (1.829 m), weight 72.394 kg (159 lb 9.6 oz), SpO2 97 %. Physical Exam  Constitutional: He is oriented to person, place, and time. He appears well-developed and well-nourished.  HENT:  Head: Normocephalic and atraumatic.  Eyes: Conjunctivae and EOM are normal. Pupils are equal, round, and reactive to light. No scleral icterus.  Neck: Normal range of motion. Neck supple. No JVD present. No tracheal deviation present. No thyromegaly present.  Cardiovascular: Normal rate and regular rhythm.  Exam reveals no gallop and no friction rub.   No murmur heard. Respiratory: Effort normal and breath sounds normal. No respiratory distress. He has no wheezes. He has no rales.  GI: Soft. Bowel sounds are normal. He exhibits no distension. There is  no tenderness. There is no rebound and no guarding.  Musculoskeletal: Normal range of motion. He exhibits edema. He exhibits no tenderness.  Lymphadenopathy:    He has no cervical adenopathy.  Neurological: He is alert and oriented to person, place, and time. He has normal reflexes. He displays normal reflexes. No cranial nerve deficit. He exhibits normal muscle tone. Coordination normal.  Skin: Skin is warm and dry. No rash noted. There is erythema. No pallor.  + erythema of the LUE, + swelling of the LUE  Psychiatric: He has a normal mood and affect. His behavior is normal. Judgment and thought content normal.  radial and ulnar pulses intact  Assessment/Plan Cellulitis vanco iv pharmacy to dose, zosyn iv pharmacy to dose  DVT LUE and RIJ Pt has hx of DVT Check hypercoag panel Pt will need heparin iv pharmacy to dose and coumadin  ESRD on HD Per ED they have consulted nephrology for HD, we appreciate their input  CAD s/p CABG Stable  Dm2 fsbs ac and qhs, iss  DVT prophylaxis: heparin   Jani Gravel 08/25/2014, 9:11 PM

## 2014-08-25 NOTE — ED Notes (Signed)
Pt with peri-umbilical pain and loose stools since yesterday.  This morning he woke up with L arm pain.  L arm swollen, warm and red.  Pt afebrile here, but states temp of 100.5 at home.  Dialysis MWF, graft on R arm.

## 2014-08-25 NOTE — ED Provider Notes (Signed)
CSN: [id number]     Arrival date & time 08/25/14  1330 History   First MD Initiated Contact with Patient 08/25/14 1513     Chief Complaint  Patient presents with  . Arm Pain    cellulitis  . Abdominal Pain     (Consider location/radiation/quality/duration/timing/severity/associated sxs/prior Treatment) Patient is a 69 y.o. male presenting with arm pain and abdominal pain. The history is provided by the patient.  Arm Pain Pertinent negatives include no chest pain, no abdominal pain, no headaches and no shortness of breath.  Abdominal Pain Associated symptoms: fever   Associated symptoms: no chest pain, no dysuria and no shortness of breath    patient here for complaint predominantly of left arm pain upon awaking this morning. Patient noticed that it was red and warm and swollen. Denied any fevers here but stated he attempted to 100.5 at home. Patient states that he had a blood clot in his right arm where his current AV fistula is in the past and has a stent there. That was done by Dr. Augustin Coupe.  Past Medical History  Diagnosis Date  . Diabetes mellitus   . Hypertension   . Myocardial infarction 1985; 1990  . Angina   . Coronary artery disease   . Dysrhythmia   . DVT (deep venous thrombosis) ~ 12/2010    LLE  . Anemia   . Blood transfusion 06/1999    post heart transplant  . Peripheral vascular disease   . CHF (congestive heart failure) 2001    beffore transplant  . Renal failure     Hemodialysis MWF last 2 years, sse dr [name] nephrology, goes to [facility]   Past Surgical History  Procedure Laterality Date  . Nephrectomy transplanted organ  2008  . Av fistula repair      rt. a=fore arm fistula  . Av fistula placement  ~ 01/2010    "this was my 2nd fistula placement"  . Heart transplant  06/1999  . Coronary angioplasty with stent placement  1985; 1990  . Colonoscopy  03/17/2012    Procedure: COLONOSCOPY;  Surgeon: Lear Ng, MD;  Location: [facility]  ENDOSCOPY;  Service: Endoscopy;  Laterality: N/A;  . Coronary artery bypass graft  1990    CABG X3  . Colonoscopy with propofol N/A 12/14/2013    Procedure: COLONOSCOPY WITH PROPOFOL;  Surgeon: Lear Ng, MD;  Location: [facility] ENDOSCOPY;  Service: Endoscopy;  Laterality: N/A;  . Video bronchoscopy Bilateral 04/14/2014    Procedure: VIDEO BRONCHOSCOPY WITH FLUORO;  Surgeon: Collene Gobble, MD;  Location: [place];  Service: Cardiopulmonary;  Laterality: Bilateral;  . Shuntogram Right 05/09/2014    Procedure: FISTULOGRAM;  Surgeon: Conrad Hazen, MD;  Location: [facility] CATH LAB;  Service: Cardiovascular;  Laterality: Right;   Family History  Problem Relation Age of Onset  . Heart disease Mother   . Hyperlipidemia Mother   . Hypertension Mother   . Hyperlipidemia Father   . Hypertension Father   . Diabetes Brother   . Heart disease Brother     Heart Disease before age 28  . Hyperlipidemia Brother   . Hypertension Brother    History  Substance Use Topics  . Smoking status: Former Smoker -- 1.00 packs/day for 20 years    Types: Cigarettes    Quit date: 05/27/1988  . Smokeless tobacco: Never Used  . Alcohol Use: Yes     Comment: "Holidays"    Review of Systems  Constitutional: Positive for fever.  HENT: Negative for congestion.   Eyes: Negative for redness.  Respiratory: Negative for shortness of breath.   Cardiovascular: Negative for chest pain.  Gastrointestinal: Negative for abdominal pain.  Genitourinary: Negative for dysuria.  Musculoskeletal: Negative for back pain.  Skin: Negative for rash.  Allergic/Immunologic: Positive for immunocompromised state.  Neurological: Negative for headaches.  Hematological: Bruises/bleeds easily.  Psychiatric/Behavioral: Negative for confusion.      Allergies  Lisinopril; Niacin and related; Norvasc; and Penicillins  Home Medications   Prior to Admission medications   Medication Sig Start Date End Date Taking? Authorizing  Provider  atorvastatin (LIPITOR) 40 MG tablet Take 40 mg by mouth daily.     Yes Historical Provider, MD  azaTHIOprine (IMURAN) 50 MG tablet Take 75 mg by mouth every morning.    Yes Historical Provider, MD  b complex-vitamin c-folic acid (NEPHRO-VITE) 0.8 MG TABS tablet Take 1 tablet by mouth at bedtime.   Yes Historical Provider, MD  calcium acetate (PHOSLO) 667 MG capsule Take 667 mg by mouth 3 (three) times daily with meals.  11/01/12  Yes Historical Provider, MD  carvedilol (COREG) 6.25 MG tablet Take 1 tablet (6.25 mg total) by mouth at bedtime. 04/15/14  Yes Geradine Girt, DO  colesevelam (WELCHOL) 625 MG tablet Take 3,750 mg by mouth daily. Takes 6 tabs daily   Yes Historical Provider, MD  famotidine (PEPCID AC) 10 MG chewable tablet Chew 10 mg by mouth at bedtime.    Yes Historical Provider, MD  HUMALOG KWIKPEN 100 UNIT/ML SOPN Inject 6-10 Units into the skin 2 (two) times daily. Per sliding scale 11/17/12  Yes Historical Provider, MD  insulin glargine (LANTUS) 100 UNIT/ML injection Inject 20 Units into the skin every morning.   Yes Historical Provider, MD  isosorbide dinitrate (ISORDIL) 40 MG tablet Take 40 mg by mouth 3 (three) times daily.     Yes Historical Provider, MD  predniSONE (DELTASONE) 10 MG tablet Take 1 tablet (10 mg total) by mouth daily with breakfast. 04/15/14  Yes Geradine Girt, DO  sirolimus (RAPAMUNE) 2 MG tablet Take 2 mg by mouth daily.   Yes Historical Provider, MD  warfarin (COUMADIN) 5 MG tablet Take 1 tablet (5 mg total) by mouth daily. Patient taking differently: Take 5 mg by mouth daily. Take 5mg  mon wed Friday Saturday and Sunday. Rest of days take 2.5mg  tab 05/11/14  Yes Shanker Kristeen Mans, MD  enoxaparin (LOVENOX) 40 MG/0.4ML injection Inject 0.7 mLs (70 mg total) into the skin daily. Patient not taking: Reported on 06/21/2014 05/11/14   Jonetta Osgood, MD  Nutritional Supplements (FEEDING SUPPLEMENT, NEPRO CARB STEADY,) LIQD Take 237 mLs by mouth as needed  (missed meal during dialysis.). Patient not taking: Reported on 06/21/2014 05/11/14   Jonetta Osgood, MD   BP 126/48 mmHg  Pulse 91  Temp(Src) 97.7 F (36.5 C) (Oral)  Resp 24  Ht 6' (1.829 m)  Wt 159 lb 9.6 oz (72.394 kg)  BMI 21.64 kg/m2  SpO2 98% Physical Exam  Constitutional: He is oriented to person, place, and time. He appears well-developed and well-nourished. No distress.  HENT:  Head: Normocephalic and atraumatic.  Mouth/Throat: Oropharynx is clear and moist.  Eyes: Conjunctivae and EOM are normal. Pupils are equal, round, and reactive to light.  Neck: Normal range of motion.  Cardiovascular: Normal rate, regular rhythm and normal heart sounds.   No murmur heard. Pulmonary/Chest: Effort normal and breath sounds normal. No respiratory distress.  Abdominal: Soft. Bowel sounds are normal.  There is no tenderness.  Musculoskeletal: He exhibits edema and tenderness.  Marked erythema to the left arm from the wrist to the midportion of the humerus. Patient has an old AV fistula site they're currently not used. Still has a bit of a thrill. Patient has good cap refill to his fingertips. Radial pulses 1+. Along with the redness there is also swelling and increased warmth and some mild tenderness to palpation throughout that area. No significant swelling to the left hand.  Neurological: He is alert and oriented to person, place, and time. No cranial nerve deficit. He exhibits normal muscle tone. Coordination normal.  Skin: Skin is warm. There is erythema.  Nursing note and vitals reviewed.   ED Course  Procedures (including critical care time) Labs Review Labs Reviewed  CBC WITH DIFFERENTIAL/PLATELET - Abnormal; Notable for the following:    RBC 3.88 (*)    Hemoglobin 11.9 (*)    HCT 37.7 (*)    RDW 17.0 (*)    Platelets 87 (*)    All other components within normal limits  COMPREHENSIVE METABOLIC PANEL - Abnormal; Notable for the following:    Glucose, Bld 126 (*)    BUN 31  (*)    Creatinine, Ser 6.60 (*)    Albumin 3.0 (*)    GFR calc non Af Amer 8 (*)    GFR calc Af Amer 9 (*)    All other components within normal limits  I-STAT CHEM 8, ED - Abnormal; Notable for the following:    BUN 32 (*)    Creatinine, Ser 6.30 (*)    Glucose, Bld 128 (*)    Calcium, Ion 1.08 (*)    Hemoglobin 12.9 (*)    HCT 38.0 (*)    All other components within normal limits  CULTURE, BLOOD (ROUTINE X 2)  CULTURE, BLOOD (ROUTINE X 2)  LIPASE, BLOOD  I-STAT CG4 LACTIC ACID, ED  I-STAT CG4 LACTIC ACID, ED   Results for orders placed or performed during the hospital encounter of 08/25/14  Culture, blood (routine x 2)  Result Value Ref Range   Specimen Description BLOOD LEFT ARM    Special Requests BOTTLES DRAWN AEROBIC AND ANAEROBIC 10CC    Culture PENDING    Report Status PENDING   CBC with Differential  Result Value Ref Range   WBC 8.1 4.0 - 10.5 K/uL   RBC 3.88 (L) 4.22 - 5.81 MIL/uL   Hemoglobin 11.9 (L) 13.0 - 17.0 g/dL   HCT 37.7 (L) 39.0 - 52.0 %   MCV 97.2 78.0 - 100.0 fL   MCH 30.7 26.0 - 34.0 pg   MCHC 31.6 30.0 - 36.0 g/dL   RDW 17.0 (H) 11.5 - 15.5 %   Platelets 87 (L) 150 - 400 K/uL   Neutrophils Relative % 70 43 - 77 %   Neutro Abs 5.7 1.7 - 7.7 K/uL   Lymphocytes Relative 21 12 - 46 %   Lymphs Abs 1.7 0.7 - 4.0 K/uL   Monocytes Relative 9 3 - 12 %   Monocytes Absolute 0.7 0.1 - 1.0 K/uL   Eosinophils Relative 0 0 - 5 %   Eosinophils Absolute 0.0 0.0 - 0.7 K/uL   Basophils Relative 0 0 - 1 %   Basophils Absolute 0.0 0.0 - 0.1 K/uL  Comprehensive metabolic panel  Result Value Ref Range   Sodium 139 135 - 145 mmol/L   Potassium 3.6 3.5 - 5.1 mmol/L   Chloride 96 96 - 112 mmol/L  CO2 28 19 - 32 mmol/L   Glucose, Bld 126 (H) 70 - 99 mg/dL   BUN 31 (H) 6 - 23 mg/dL   Creatinine, Ser 6.60 (H) 0.50 - 1.35 mg/dL   Calcium 9.2 8.4 - 10.5 mg/dL   Total Protein 6.2 6.0 - 8.3 g/dL   Albumin 3.0 (L) 3.5 - 5.2 g/dL   AST 29 0 - 37 U/L   ALT 16 0 - 53  U/L   Alkaline Phosphatase 82 39 - 117 U/L   Total Bilirubin 0.8 0.3 - 1.2 mg/dL   GFR calc non Af Amer 8 (L) >90 mL/min   GFR calc Af Amer 9 (L) >90 mL/min   Anion gap 15 5 - 15  Lipase, blood  Result Value Ref Range   Lipase 34 11 - 59 U/L  I-Stat Chem 8, ED  Result Value Ref Range   Sodium 137 135 - 145 mmol/L   Potassium 3.5 3.5 - 5.1 mmol/L   Chloride 97 96 - 112 mmol/L   BUN 32 (H) 6 - 23 mg/dL   Creatinine, Ser 6.30 (H) 0.50 - 1.35 mg/dL   Glucose, Bld 128 (H) 70 - 99 mg/dL   Calcium, Ion 1.08 (L) 1.13 - 1.30 mmol/L   TCO2 24 0 - 100 mmol/L   Hemoglobin 12.9 (L) 13.0 - 17.0 g/dL   HCT 38.0 (L) 39.0 - 52.0 %  I-Stat CG4 Lactic Acid, ED  Result Value Ref Range   Lactic Acid, Venous 1.95 0.5 - 2.0 mmol/L  I-Stat CG4 Lactic Acid, ED  Result Value Ref Range   Lactic Acid, Venous 1.04 0.5 - 2.0 mmol/L    Imaging Review Dg Chest Port 1 View  08/25/2014   CLINICAL DATA:  Left arm pain.  Fever for 1 day.  EXAM: PORTABLE CHEST - 1 VIEW  COMPARISON:  05/08/2014  FINDINGS: Mild cardiomegaly is stable. Aortic and hilar contours are unchanged. Bilateral central venous stents, new on the right from prior. There is no edema, consolidation, effusion, or pneumothorax.  IMPRESSION: No active disease.   Electronically Signed   By: Monte Fantasia M.D.   On: 08/25/2014 15:55     EKG Interpretation None      MDM   Final diagnoses:  Arm pain  Cellulitis of left upper extremity  End stage renal disease on dialysis  DVT (deep venous thrombosis), bilateral    Patient is a dialysis patient. Patient with cellulitis to the left arm. Patient normally has his functional AV fistula in his right arm that they use for dialysis. Patient also does have a history of deep vein thrombosis in both arms. Doppler studies done by vascular show of a deep vein thrombosis in the left arm to the subclavian stent. Also has involvement of deep vein thrombosis on the right arm including the jugular system.  Patient started on vancomycin for the cellulitis and I will be heparinized. Will be admitted by triad hospitalist. Consult up without to nephrology with a having called yet. Patient clinically stable here no acute distress. Patient's labs from a renal standpoint looked very reasonable. No evidence of any pulmonary edema or congestive heart failure.    Fredia Sorrow, MD 08/25/14 2203

## 2014-08-26 DIAGNOSIS — D638 Anemia in other chronic diseases classified elsewhere: Secondary | ICD-10-CM

## 2014-08-26 DIAGNOSIS — Z941 Heart transplant status: Secondary | ICD-10-CM

## 2014-08-26 DIAGNOSIS — Z9225 Personal history of immunosupression therapy: Secondary | ICD-10-CM

## 2014-08-26 DIAGNOSIS — D696 Thrombocytopenia, unspecified: Secondary | ICD-10-CM

## 2014-08-26 DIAGNOSIS — I255 Ischemic cardiomyopathy: Secondary | ICD-10-CM

## 2014-08-26 DIAGNOSIS — I82722 Chronic embolism and thrombosis of deep veins of left upper extremity: Secondary | ICD-10-CM

## 2014-08-26 DIAGNOSIS — Z940 Kidney transplant status: Secondary | ICD-10-CM

## 2014-08-26 LAB — CBC
HEMATOCRIT: 31.9 % — AB (ref 39.0–52.0)
Hemoglobin: 10 g/dL — ABNORMAL LOW (ref 13.0–17.0)
MCH: 30 pg (ref 26.0–34.0)
MCHC: 31.3 g/dL (ref 30.0–36.0)
MCV: 95.8 fL (ref 78.0–100.0)
Platelets: 79 10*3/uL — ABNORMAL LOW (ref 150–400)
RBC: 3.33 MIL/uL — ABNORMAL LOW (ref 4.22–5.81)
RDW: 16.9 % — AB (ref 11.5–15.5)
WBC: 6.8 10*3/uL (ref 4.0–10.5)

## 2014-08-26 LAB — COMPREHENSIVE METABOLIC PANEL
ALBUMIN: 2.7 g/dL — AB (ref 3.5–5.2)
ALK PHOS: 66 U/L (ref 39–117)
ALT: 12 U/L (ref 0–53)
AST: 24 U/L (ref 0–37)
Anion gap: 12 (ref 5–15)
BUN: 39 mg/dL — ABNORMAL HIGH (ref 6–23)
CHLORIDE: 98 mmol/L (ref 96–112)
CO2: 28 mmol/L (ref 19–32)
CREATININE: 7.59 mg/dL — AB (ref 0.50–1.35)
Calcium: 8.7 mg/dL (ref 8.4–10.5)
GFR calc Af Amer: 7 mL/min — ABNORMAL LOW (ref >90)
GFR calc non Af Amer: 6 mL/min — ABNORMAL LOW (ref >90)
Glucose, Bld: 102 mg/dL — ABNORMAL HIGH (ref 70–99)
POTASSIUM: 4 mmol/L (ref 3.5–5.1)
Sodium: 138 mmol/L (ref 135–145)
Total Bilirubin: 0.8 mg/dL (ref 0.3–1.2)
Total Protein: 5.4 g/dL — ABNORMAL LOW (ref 6.0–8.3)

## 2014-08-26 LAB — GLUCOSE, CAPILLARY
GLUCOSE-CAPILLARY: 170 mg/dL — AB (ref 70–99)
Glucose-Capillary: 110 mg/dL — ABNORMAL HIGH (ref 70–99)
Glucose-Capillary: 137 mg/dL — ABNORMAL HIGH (ref 70–99)
Glucose-Capillary: 70 mg/dL (ref 70–99)

## 2014-08-26 LAB — HEPARIN LEVEL (UNFRACTIONATED)
HEPARIN UNFRACTIONATED: 0.54 [IU]/mL (ref 0.30–0.70)
HEPARIN UNFRACTIONATED: 0.64 [IU]/mL (ref 0.30–0.70)

## 2014-08-26 LAB — PROTIME-INR
INR: 2.44 — ABNORMAL HIGH (ref 0.00–1.49)
PROTHROMBIN TIME: 26.7 s — AB (ref 11.6–15.2)

## 2014-08-26 LAB — ANTITHROMBIN III: ANTITHROMB III FUNC: 88 % (ref 75–120)

## 2014-08-26 LAB — APTT: aPTT: 101 seconds — ABNORMAL HIGH (ref 24–37)

## 2014-08-26 LAB — HEPATITIS B SURFACE ANTIGEN: Hepatitis B Surface Ag: NEGATIVE

## 2014-08-26 MED ORDER — ALTEPLASE 2 MG IJ SOLR
2.0000 mg | Freq: Once | INTRAMUSCULAR | Status: AC | PRN
  Filled 2014-08-26: qty 2

## 2014-08-26 MED ORDER — WARFARIN - PHARMACIST DOSING INPATIENT
Freq: Every day | Status: DC
  Administered 2014-08-27: 18:00:00

## 2014-08-26 MED ORDER — LIDOCAINE HCL (PF) 1 % IJ SOLN: 5.0000 mL | INTRAMUSCULAR | Status: DC | PRN

## 2014-08-26 MED ORDER — DEXTROSE 5 % IV SOLN: 2.0000 g | INTRAVENOUS | Status: DC

## 2014-08-26 MED ORDER — DARBEPOETIN ALFA 100 MCG/0.5ML IJ SOSY: 100.0000 ug | PREFILLED_SYRINGE | INTRAMUSCULAR | Status: DC

## 2014-08-26 MED ORDER — WARFARIN SODIUM 5 MG PO TABS: 2.5000 mg | ORAL_TABLET | ORAL | Status: DC

## 2014-08-26 MED ORDER — DOXERCALCIFEROL 4 MCG/2ML IV SOLN
INTRAVENOUS | Status: AC
  Filled 2014-08-26: qty 2

## 2014-08-26 MED ORDER — PENTAFLUOROPROP-TETRAFLUOROETH EX AERO: 1.0000 "application " | INHALATION_SPRAY | CUTANEOUS | Status: DC | PRN

## 2014-08-26 MED ORDER — VANCOMYCIN HCL IN DEXTROSE 750-5 MG/150ML-% IV SOLN
750.0000 mg | INTRAVENOUS | Status: AC
  Administered 2014-08-26: 750 mg via INTRAVENOUS
  Filled 2014-08-26: qty 150

## 2014-08-26 MED ORDER — WARFARIN SODIUM 5 MG PO TABS
5.0000 mg | ORAL_TABLET | ORAL | Status: DC
  Administered 2014-08-26: 5 mg via ORAL
  Filled 2014-08-26 (×2): qty 1

## 2014-08-26 MED ORDER — RENA-VITE PO TABS
1.0000 | ORAL_TABLET | Freq: Every day | ORAL | Status: DC
  Administered 2014-08-26 – 2014-08-28 (×3): 1 via ORAL
  Filled 2014-08-26 (×3): qty 1

## 2014-08-26 MED ORDER — DOXERCALCIFEROL 4 MCG/2ML IV SOLN
4.0000 ug | INTRAVENOUS | Status: DC
  Administered 2014-08-26 – 2014-08-29 (×2): 4 ug via INTRAVENOUS
  Filled 2014-08-26 (×2): qty 2

## 2014-08-26 MED ORDER — VANCOMYCIN HCL IN DEXTROSE 750-5 MG/150ML-% IV SOLN
750.0000 mg | INTRAVENOUS | Status: DC
  Administered 2014-08-29: 750 mg via INTRAVENOUS
  Filled 2014-08-26 (×3): qty 150

## 2014-08-26 MED ORDER — NEPRO/CARBSTEADY PO LIQD
237.0000 mL | ORAL | Status: DC | PRN
  Filled 2014-08-26: qty 237

## 2014-08-26 MED ORDER — SODIUM CHLORIDE 0.9 % IV SOLN: 100.0000 mL | INTRAVENOUS | Status: DC | PRN

## 2014-08-26 MED ORDER — LIDOCAINE-PRILOCAINE 2.5-2.5 % EX CREA
1.0000 "application " | TOPICAL_CREAM | CUTANEOUS | Status: DC | PRN
  Filled 2014-08-26: qty 5

## 2014-08-26 MED ORDER — DEXTROSE 5 % IV SOLN
2.0000 g | INTRAVENOUS | Status: AC
  Administered 2014-08-26: 2 g via INTRAVENOUS
  Filled 2014-08-26: qty 2

## 2014-08-26 MED ORDER — HEPARIN SODIUM (PORCINE) 1000 UNIT/ML DIALYSIS
1000.0000 [IU] | INTRAMUSCULAR | Status: DC | PRN
  Filled 2014-08-26: qty 1

## 2014-08-26 NOTE — Progress Notes (Signed)
ANTICOAGULATION CONSULT NOTE - Follow Up Consult  Pharmacy Consult for Heparin Indication: DVT  Allergies  Allergen Reactions  . Lisinopril Swelling    Lips and tongue swell  . Niacin And Related Other (See Comments)    unknown  . Norvasc [Amlodipine Besylate] Rash    Flushing  . Penicillins Rash    Patient Measurements: Height: 6' (182.9 cm) Weight: 162 lb 11.2 oz (73.8 kg) IBW/kg (Calculated) : 77.6 Heparin Dosing Weight: 72 kg  Vital Signs: Temp: 98 F (36.7 C) (04/01 1230) Temp Source: Oral (04/01 1230) BP: 127/62 mmHg (04/01 1530) Pulse Rate: 79 (04/01 1530)  Labs:  Recent Labs  08/25/14 1536 08/25/14 1549 08/26/14 0100 08/26/14 0332 08/26/14 0543 08/26/14 1434  HGB 11.9* 12.9*  --  10.0*  --   --   HCT 37.7* 38.0*  --  31.9*  --   --   PLT 87*  --   --  79*  --   --   APTT  --   --  101*  --   --   --   LABPROT  --   --  26.7*  --   --   --   INR  --   --  2.44*  --   --   --   HEPARINUNFRC  --   --   --   --  0.54 0.64  CREATININE 6.60* 6.30*  --  7.59*  --   --     Estimated Creatinine Clearance: 9.6 mL/min (by C-G formula based on Cr of 7.59).   Assessment: 33 YOM with a history of DVTs on Coumadin PTA (INR 2.44 on admit), presented to the ED with left arm pain and swelling. U/S showed L subclavian DVT and nearly occluded RIJ. Coumadin failure?  Anticoagulation: h/o DVTs (2012, 2015). INR 2.44 overnight. IV heparin level 0.64 remains in goal range. - Coumadin PTA 5mg  MWFSS, 2.5mg  TTh  Infectious Disease: Vanco/Cefepime #1 for LUE cellulitis. Afebrile. WBC 6.8. ESRD Vanco 4/1>> Cefepime 4/1>>  Cardiovascular: CAD, HTN, CABGh/o heart transplant. VSS. Meds: Lipitor40, Coreg, Welchol, Isordil,  -Transplant meds: Azathioprine, Prednisone, Rapamune  Endocrinology: DM with CBGs 73-137 on SSI, Lantus  Gastrointestinal / Nutrition: pepcid  Neurology  Nephrology: h/o failed renal transplant with ESRD on Phoslo, Hectorol, Renavite,    Pulmonary  Hematology / Oncology: Anemia of chronic dz on Aranesp/Wed  PTA Medication Issues: none  Best Practices   Goal of Therapy:  Heparin level 0.3-0.7 units/ml  INR 2-3 Monitor platelets by anticoagulation protocol: Yes   Plan:  Continue heparin drip pending hematology evaluation Heparin continue 1200 units/hr. Heparin level in AM Coumadin continue home regimen Vanco 750mg  IV MWF Cefepime 2g IV MWF   Brandon Sharp, PharmD, BCPS Clinical Staff Pharmacist Pager [phone]  Brandon Sharp 08/26/2014,3:43 PM

## 2014-08-26 NOTE — Progress Notes (Addendum)
ANTICOAGULATION CONSULT NOTE - Initial Consult  Pharmacy Consult for Heparin  Indication: New DVT + h/o DVT  Allergies  Allergen Reactions  . Lisinopril Swelling    Lips and tongue swell  . Niacin And Related Other (See Comments)    unknown  . Norvasc [Amlodipine Besylate] Rash    Flushing  . Penicillins Rash    Patient Measurements: Height: 6' (182.9 cm) Weight: 159 lb 6.3 oz (72.3 kg) IBW/kg (Calculated) : 77.6 Heparin Dosing Weight: 72 kg   Vital Signs: Temp: 99.3 F (37.4 C) (04/01 0540) Temp Source: Oral (04/01 0540) BP: 113/42 mmHg (04/01 0540) Pulse Rate: 89 (04/01 0540)  Labs:  Recent Labs  08/25/14 1536 08/25/14 1549 08/26/14 0100 08/26/14 0332 08/26/14 0543  HGB 11.9* 12.9*  --  10.0*  --   HCT 37.7* 38.0*  --  31.9*  --   PLT 87*  --   --  79*  --   APTT  --   --  101*  --   --   LABPROT  --   --  26.7*  --   --   INR  --   --  2.44*  --   --   HEPARINUNFRC  --   --   --   --  0.54  CREATININE 6.60* 6.30*  --  7.59*  --     Estimated Creatinine Clearance: 9.4 mL/min (by C-G formula based on Cr of 7.59).   Medical History: Past Medical History  Diagnosis Date  . Diabetes mellitus   . Hypertension   . Myocardial infarction 1985; 1990  . Angina   . Coronary artery disease   . Dysrhythmia   . DVT (deep venous thrombosis) ~ 12/2010    LLE  . Anemia   . Blood transfusion 06/1999    post heart transplant  . Peripheral vascular disease   . CHF (congestive heart failure) 2001    beffore transplant  . Renal failure     Hemodialysis MWF last 2 years, sse dr [name] nephrology, goes to [place] [facility]  . DVT (deep venous thrombosis) 08/25/2014    Nearly occluded R IJ, and L subclavian DVT    Medications:  Prescriptions prior to admission  Medication Sig Dispense Refill Last Dose  . atorvastatin (LIPITOR) 40 MG tablet Take 40 mg by mouth daily.     08/24/2014 at 2200  . azaTHIOprine (IMURAN) 50 MG tablet Take 75 mg by mouth every  morning.    08/24/2014 at Unknown time  . b complex-vitamin c-folic acid (NEPHRO-VITE) 0.8 MG TABS tablet Take 1 tablet by mouth at bedtime.   08/24/2014 at Unknown time  . calcium acetate (PHOSLO) 667 MG capsule Take 667 mg by mouth 3 (three) times daily with meals.    08/24/2014 at Unknown time  . carvedilol (COREG) 6.25 MG tablet Take 1 tablet (6.25 mg total) by mouth at bedtime. 30 tablet 0 08/24/2014 at 2200  . colesevelam (WELCHOL) 625 MG tablet Take 3,750 mg by mouth daily. Takes 6 tabs daily   08/24/2014 at Unknown time  . famotidine (PEPCID AC) 10 MG chewable tablet Chew 10 mg by mouth at bedtime.    08/24/2014 at Unknown time  . HUMALOG KWIKPEN 100 UNIT/ML SOPN Inject 6-10 Units into the skin 2 (two) times daily. Per sliding scale   08/24/2014 at Unknown time  . insulin glargine (LANTUS) 100 UNIT/ML injection Inject 20 Units into the skin every morning.   08/24/2014 at Unknown time  .  isosorbide dinitrate (ISORDIL) 40 MG tablet Take 40 mg by mouth 3 (three) times daily.     08/24/2014 at Unknown time  . predniSONE (DELTASONE) 10 MG tablet Take 1 tablet (10 mg total) by mouth daily with breakfast. 30 tablet 0 08/24/2014 at Unknown time  . sirolimus (RAPAMUNE) 2 MG tablet Take 2 mg by mouth daily.   08/24/2014 at Unknown time  . warfarin (COUMADIN) 5 MG tablet Take 1 tablet (5 mg total) by mouth daily. (Patient taking differently: Take 5 mg by mouth daily. Take 5mg  mon wed Friday Saturday and Sunday. Rest of days take 2.5mg  tab) 30 tablet 0 08/24/2014 at Unknown time  . enoxaparin (LOVENOX) 40 MG/0.4ML injection Inject 0.7 mLs (70 mg total) into the skin daily. (Patient not taking: Reported on 06/21/2014) 6 Syringe 0 Not Taking at Unknown time  . Nutritional Supplements (FEEDING SUPPLEMENT, NEPRO CARB STEADY,) LIQD Take 237 mLs by mouth as needed (missed meal during dialysis.). (Patient not taking: Reported on 06/21/2014) 30 Can 0 Not Taking at Unknown time    Assessment: 86 YOM with a history of DVTs on  Coumadin prior to admission presented to the ED today with left arm pain and swelling. U/S showed L subclavian DVT and nearly occluded RIJ. Pharmacy consulted to start IV heparin.   H/H mildly low, Plt 87 (BL ~ 100); INR ordered.   Initial HL is therapeutic at 0.54 on heparin 1200 units/hr. Hgb 10, Plt 79, and no bleeding noted.  Goal of Therapy:  Heparin level 0.3-0.7 units/ml Monitor platelets by anticoagulation protocol: Yes   Plan:  Continue heparin 1200 units/hr Confirmatory 8h HL Daily HL/CBC F/u on long-term anticoag plan  Andrey Cota. Diona Foley, PharmD Clinical Pharmacist Pager [phone]

## 2014-08-26 NOTE — Consult Note (Signed)
[place] NOTE  Patient Care Team: [name], MD as PCP - General (Family Medicine)  CHIEF COMPLAINTS/PURPOSE OF CONSULTATION:  Recurrent DVT with therapeutic INR  HISTORY OF PRESENTING ILLNESS:  Brandon Sharp 69 y.o. male is admitted because of recurrent DVT.  He had remote history of right lower extremity DVT. The patient had history for heart transplant for ischemic cardiomyopathy and renal transplant. His renal transplant failed 3 years ago and he had been HD dependent via fistula.  He remained on chronic anti-rejection medications and had recent recurrent hospitalization for sepsis/ pneumonia. His last hospitalization was in December 2015 for fever and right IJ DVT requiring fistulogram and venoplasty on 05/09/14. He was discharged on warfarin with PCP monitoring, goal INR 2-3 with duration life-long due to recurrent DVT. According to him his INR had been therapeutic. He woke up yesterday with sudden acute left upper extremity swelling with warmth, tenderness & erythema.  He denies recent chest pain on exertion, shortness of breath on minimal exertion, pre-syncopal episodes, hemoptysis, or palpitation. He denies recent history of trauma, long distance travel, dehydration, recent surgery, smoking or prolonged immobilization. Results from venous Doppler showed partial acute left mid distal subclavian vein & nearly occluded acute DVT or right jugular vein. He had prior surgeries before and never had perioperative thromboembolic events. There is no family history of blood clots or miscarriages. He is admitted with IV heparin. He had minor bleeding recently with nose bleeds which is self limiting. The patient denies any recent signs or symptoms of bleeding such as spontaneous hematuria or hematochezia.  MEDICAL HISTORY:  Past Medical History  Diagnosis Date  . Diabetes mellitus   . Hypertension   . Myocardial infarction 1985; 1990  . Angina   . Coronary  artery disease   . Dysrhythmia   . DVT (deep venous thrombosis) ~ 12/2010    LLE  . Anemia   . Blood transfusion 06/1999    post heart transplant  . Peripheral vascular disease   . CHF (congestive heart failure) 2001    beffore transplant  . Renal failure     Hemodialysis MWF last 2 years, sse dr [name] nephrology, goes to [place] [facility]  . DVT (deep venous thrombosis) 08/25/2014    Nearly occluded R IJ, and L subclavian DVT    SURGICAL HISTORY: Past Surgical History  Procedure Laterality Date  . Nephrectomy transplanted organ  2008  . Av fistula repair      rt. a=fore arm fistula  . Av fistula placement  ~ 01/2010    "this was my 2nd fistula placement"  . Heart transplant  06/1999  . Coronary angioplasty with stent placement  1985; 1990  . Colonoscopy  03/17/2012    Procedure: COLONOSCOPY;  Surgeon: Lear Ng, MD;  Location: [facility];  Service: Endoscopy;  Laterality: N/A;  . Coronary artery bypass graft  1990    CABG X3  . Colonoscopy with propofol N/A 12/14/2013    Procedure: COLONOSCOPY WITH PROPOFOL;  Surgeon: Lear Ng, MD;  Location: [facility];  Service: Endoscopy;  Laterality: N/A;  . Video bronchoscopy Bilateral 04/14/2014    Procedure: VIDEO BRONCHOSCOPY WITH FLUORO;  Surgeon: Collene Gobble, MD;  Location: [place];  Service: Cardiopulmonary;  Laterality: Bilateral;  . Shuntogram Right 05/09/2014    Procedure: FISTULOGRAM;  Surgeon: Conrad Elbing, MD;  Location: [facility] CATH LAB;  Service: Cardiovascular;  Laterality: Right;    SOCIAL HISTORY: History  Social History  . Marital Status: Married    Spouse Name: N/A  . Number of Children: N/A  . Years of Education: N/A   Occupational History  . Not on file.   Social History Main Topics  . Smoking status: Former Smoker -- 1.00 packs/day for 20 years    Types: Cigarettes    Quit date: 05/27/1988  . Smokeless tobacco: Never Used  . Alcohol Use: Yes     Comment:  "Holidays"  . Drug Use: No  . Sexual Activity: Not on file   Other Topics Concern  . Not on file   Social History Narrative    FAMILY HISTORY: Family History  Problem Relation Age of Onset  . Heart disease Mother   . Hyperlipidemia Mother   . Hypertension Mother   . Hyperlipidemia Father   . Hypertension Father   . Diabetes Brother   . Heart disease Brother     Heart Disease before age 40  . Hyperlipidemia Brother   . Hypertension Brother     ALLERGIES:  is allergic to lisinopril; niacin and related; norvasc; and penicillins.  MEDICATIONS:  Current Facility-Administered Medications  Medication Dose Route Frequency Provider Last Rate Last Dose  . 0.9 %  sodium chloride infusion   Intravenous STAT Fredia Sorrow, MD      . 0.9 %  sodium chloride infusion  250 mL Intravenous PRN Jani Gravel, MD      . 0.9 %  sodium chloride infusion  100 mL Intravenous PRN Shelle Iron, NP      . 0.9 %  sodium chloride infusion  100 mL Intravenous PRN Shelle Iron, NP      . acetaminophen (TYLENOL) tablet 650 mg  650 mg Oral Q6H PRN Jani Gravel, MD       Or  . acetaminophen (TYLENOL) suppository 650 mg  650 mg Rectal Q6H PRN Jani Gravel, MD      . alteplase (CATHFLO ACTIVASE) injection 2 mg  2 mg Intracatheter Once PRN Shelle Iron, NP      . atorvastatin (LIPITOR) tablet 40 mg  40 mg Oral Daily Jani Gravel, MD   40 mg at 08/26/14 1049  . azaTHIOprine (IMURAN) tablet 75 mg  75 mg Oral q morning - 10a Jani Gravel, MD   75 mg at 08/26/14 0018  . calcium acetate (PHOSLO) capsule 667 mg  667 mg Oral TID WC Jani Gravel, MD   667 mg at 08/26/14 1839  . carvedilol (COREG) tablet 6.25 mg  6.25 mg Oral QHS Jani Gravel, MD   6.25 mg at 08/26/14 0017  . [START ON 08/29/2014] ceFEPIme (MAXIPIME) 2 g in dextrose 5 % 50 mL IVPB  2 g Intravenous Q M,W,F-HD Karren Cobble, RPH      . colesevelam [facility]) tablet 3,750 mg  3,750 mg Oral Daily Jani Gravel, MD   3,750 mg at 08/26/14 1049  . [START ON 08/31/2014]  Darbepoetin Alfa (ARANESP) injection 100 mcg  100 mcg Intravenous Q Wed-HD Shelle Iron, NP      . doxercalciferol (HECTOROL) injection 4 mcg  4 mcg Intravenous Q M,W,F-HD Shelle Iron, NP   4 mcg at 08/26/14 1331  . famotidine (PEPCID) tablet 10 mg  10 mg Oral QHS Jani Gravel, MD   10 mg at 08/26/14 0018  . feeding supplement (NEPRO CARB STEADY) liquid 237 mL  237 mL Oral PRN Shelle Iron, NP      . heparin ADULT infusion 100 units/mL (25000 units/250 mL)  1,200 Units/hr  Intravenous Continuous Lavenia Atlas, RPH 12 mL/hr at 08/26/14 1747 1,200 Units/hr at 08/26/14 1747  . heparin injection 1,000 Units  1,000 Units Dialysis PRN Shelle Iron, NP      . insulin aspart (novoLOG) injection 0-5 Units  0-5 Units Subcutaneous QHS Jani Gravel, MD   0 Units at 08/25/14 2317  . insulin aspart (novoLOG) injection 0-9 Units  0-9 Units Subcutaneous TID WC Jani Gravel, MD      . insulin glargine (LANTUS) injection 20 Units  20 Units Subcutaneous q morning - 10a Jani Gravel, MD   20 Units at 08/26/14 1049  . isosorbide dinitrate (ISORDIL) tablet 40 mg  40 mg Oral TID Jani Gravel, MD   40 mg at 08/26/14 1050  . lidocaine (PF) (XYLOCAINE) 1 % injection 5 mL  5 mL Intradermal PRN Shelle Iron, NP      . lidocaine-prilocaine (EMLA) cream 1 application  1 application Topical PRN Shelle Iron, NP      . multivitamin (RENA-VIT) tablet 1 tablet  1 tablet Oral QHS Shelle Iron, NP      . pentafluoroprop-tetrafluoroeth (GEBAUERS) aerosol 1 application  1 application Topical PRN Shelle Iron, NP      . predniSONE (DELTASONE) tablet 10 mg  10 mg Oral Q breakfast Jani Gravel, MD   10 mg at 08/26/14 0017  . sirolimus (RAPAMUNE) tablet 2 mg  2 mg Oral Daily Jani Gravel, MD   2 mg at 08/26/14 1050  . sodium chloride 0.9 % injection 3 mL  3 mL Intravenous Q12H Jani Gravel, MD      . sodium chloride 0.9 % injection 3 mL  3 mL Intravenous Q12H Jani Gravel, MD      . sodium chloride 0.9 % injection 3 mL  3 mL Intravenous PRN  Jani Gravel, MD      . Derrill Memo ON 08/29/2014] vancomycin (VANCOCIN) IVPB 750 mg/150 ml premix  750 mg Intravenous Q M,W,F-HD Karren Cobble, RPH      . [START ON 08/30/2014] warfarin (COUMADIN) tablet 2.5 mg  2.5 mg Oral Once per day on Tue Thu [facility] S Robertson, [facility]      . warfarin (COUMADIN) tablet 5 mg  5 mg Oral Once per day on Sun Mon Wed Fri Sat Karren Cobble, RPH   5 mg at 08/26/14 1905  . Warfarin - Pharmacist Dosing Inpatient   Does not apply q1800 Karren Cobble, RPH        REVIEW OF SYSTEMS:   Constitutional: Denies fevers, chills or abnormal night sweats Eyes: Denies blurriness of vision, double vision or watery eyes Ears, nose, mouth, throat, and face: Denies mucositis or sore throat Respiratory: Denies cough, dyspnea or wheezes Cardiovascular: Denies palpitation, chest discomfort  Gastrointestinal:  Denies nausea, heartburn or change in bowel habits Skin: Denies abnormal skin rashes Lymphatics: Denies new lymphadenopathy or easy bruising Neurological:Denies numbness, tingling or new weaknesses Behavioral/Psych: Mood is stable, no new changes  All other systems were reviewed with the patient and are negative.  PHYSICAL EXAMINATION: ECOG PERFORMANCE STATUS: 1 - Symptomatic but completely ambulatory  Filed Vitals:   08/26/14 1720  BP: 159/70  Pulse: 70  Temp: 98.1 F (36.7 C)  Resp: 16   Filed Weights   08/26/14 0540 08/26/14 1230 08/26/14 1646  Weight: 159 lb 6.3 oz (72.3 kg) 162 lb 11.2 oz (73.8 kg) 158 lb 11.7 oz (72 kg)    GENERAL:alert, no distress and comfortable SKIN: skin color, texture, turgor are normal, no rashes  or significant lesions EYES: normal, conjunctiva are pink and non-injected, sclera clear OROPHARYNX:no exudate, no erythema and lips, buccal mucosa, and tongue normal  NECK: supple, thyroid normal size, non-tender, without nodularity LYMPH:  no palpable lymphadenopathy in the cervical, axillary or inguinal LUNGS: clear to  auscultation and percussion with normal breathing effort HEART: regular rate & rhythm and no murmurs and bilateral upper extremity swelling ABDOMEN:abdomen soft, non-tender and normal bowel sounds. Well healed surgical scars Musculoskeletal:no cyanosis of digits and no clubbing  PSYCH: alert & oriented x 3 with fluent speech NEURO: no focal motor/sensory deficits  LABORATORY DATA:  I have reviewed the data as listed Recent Results (from the past 2160 hour(s))  CBC with Differential     Status: Abnormal   Collection Time: 08/25/14  3:36 PM  Result Value Ref Range   WBC 8.1 4.0 - 10.5 K/uL   RBC 3.88 (L) 4.22 - 5.81 MIL/uL   Hemoglobin 11.9 (L) 13.0 - 17.0 g/dL   HCT 37.7 (L) 39.0 - 52.0 %   MCV 97.2 78.0 - 100.0 fL   MCH 30.7 26.0 - 34.0 pg   MCHC 31.6 30.0 - 36.0 g/dL   RDW 17.0 (H) 11.5 - 15.5 %   Platelets 87 (L) 150 - 400 K/uL    Comment: PLATELET COUNT CONFIRMED BY SMEAR   Neutrophils Relative % 70 43 - 77 %   Neutro Abs 5.7 1.7 - 7.7 K/uL   Lymphocytes Relative 21 12 - 46 %   Lymphs Abs 1.7 0.7 - 4.0 K/uL   Monocytes Relative 9 3 - 12 %   Monocytes Absolute 0.7 0.1 - 1.0 K/uL   Eosinophils Relative 0 0 - 5 %   Eosinophils Absolute 0.0 0.0 - 0.7 K/uL   Basophils Relative 0 0 - 1 %   Basophils Absolute 0.0 0.0 - 0.1 K/uL  Comprehensive metabolic panel     Status: Abnormal   Collection Time: 08/25/14  3:36 PM  Result Value Ref Range   Sodium 139 135 - 145 mmol/L   Potassium 3.6 3.5 - 5.1 mmol/L   Chloride 96 96 - 112 mmol/L   CO2 28 19 - 32 mmol/L   Glucose, Bld 126 (H) 70 - 99 mg/dL   BUN 31 (H) 6 - 23 mg/dL   Creatinine, Ser 6.60 (H) 0.50 - 1.35 mg/dL   Calcium 9.2 8.4 - 10.5 mg/dL   Total Protein 6.2 6.0 - 8.3 g/dL   Albumin 3.0 (L) 3.5 - 5.2 g/dL   AST 29 0 - 37 U/L   ALT 16 0 - 53 U/L   Alkaline Phosphatase 82 39 - 117 U/L   Total Bilirubin 0.8 0.3 - 1.2 mg/dL   GFR calc non Af Amer 8 (L) >90 mL/min   GFR calc Af Amer 9 (L) >90 mL/min    Comment: (NOTE) The  eGFR has been calculated using the CKD EPI equation. This calculation has not been validated in all clinical situations. eGFR's persistently <90 mL/min signify possible Chronic Kidney Disease.    Anion gap 15 5 - 15  Lipase, blood     Status: None   Collection Time: 08/25/14  3:36 PM  Result Value Ref Range   Lipase 34 11 - 59 U/L  Culture, blood (routine x 2)     Status: None (Preliminary result)   Collection Time: 08/25/14  3:44 PM  Result Value Ref Range   Specimen Description BLOOD LEFT ARM    Special Requests BOTTLES DRAWN AEROBIC AND  ANAEROBIC 10CC    Culture             BLOOD CULTURE RECEIVED NO GROWTH TO DATE CULTURE WILL BE HELD FOR 5 DAYS BEFORE ISSUING A FINAL NEGATIVE REPORT Performed at [facility]    Report Status PENDING   I-Stat Chem 8, ED     Status: Abnormal   Collection Time: 08/25/14  3:49 PM  Result Value Ref Range   Sodium 137 135 - 145 mmol/L   Potassium 3.5 3.5 - 5.1 mmol/L   Chloride 97 96 - 112 mmol/L   BUN 32 (H) 6 - 23 mg/dL   Creatinine, Ser 6.30 (H) 0.50 - 1.35 mg/dL   Glucose, Bld 128 (H) 70 - 99 mg/dL   Calcium, Ion 1.08 (L) 1.13 - 1.30 mmol/L   TCO2 24 0 - 100 mmol/L   Hemoglobin 12.9 (L) 13.0 - 17.0 g/dL   HCT 38.0 (L) 39.0 - 52.0 %  I-Stat CG4 Lactic Acid, ED     Status: None   Collection Time: 08/25/14  3:49 PM  Result Value Ref Range   Lactic Acid, Venous 1.95 0.5 - 2.0 mmol/L  I-Stat CG4 Lactic Acid, ED     Status: None   Collection Time: 08/25/14  7:22 PM  Result Value Ref Range   Lactic Acid, Venous 1.04 0.5 - 2.0 mmol/L  Glucose, capillary     Status: None   Collection Time: 08/25/14 10:33 PM  Result Value Ref Range   Glucose-Capillary 73 70 - 99 mg/dL  APTT     Status: Abnormal   Collection Time: 08/26/14  1:00 AM  Result Value Ref Range   aPTT 101 (H) 24 - 37 seconds    Comment:        IF BASELINE aPTT IS ELEVATED, SUGGEST PATIENT RISK ASSESSMENT BE USED TO DETERMINE APPROPRIATE ANTICOAGULANT THERAPY.    Protime-INR     Status: Abnormal   Collection Time: 08/26/14  1:00 AM  Result Value Ref Range   Prothrombin Time 26.7 (H) 11.6 - 15.2 seconds   INR 2.44 (H) 0.00 - 1.49  Antithrombin III     Status: None   Collection Time: 08/26/14  1:00 AM  Result Value Ref Range   AntiThromb III Func 88 75 - 120 %  CBC     Status: Abnormal   Collection Time: 08/26/14  3:32 AM  Result Value Ref Range   WBC 6.8 4.0 - 10.5 K/uL   RBC 3.33 (L) 4.22 - 5.81 MIL/uL   Hemoglobin 10.0 (L) 13.0 - 17.0 g/dL    Comment: DELTA CHECK NOTED REPEATED TO VERIFY    HCT 31.9 (L) 39.0 - 52.0 %   MCV 95.8 78.0 - 100.0 fL   MCH 30.0 26.0 - 34.0 pg   MCHC 31.3 30.0 - 36.0 g/dL   RDW 16.9 (H) 11.5 - 15.5 %   Platelets 79 (L) 150 - 400 K/uL    Comment: CONSISTENT WITH PREVIOUS RESULT  Comprehensive metabolic panel     Status: Abnormal   Collection Time: 08/26/14  3:32 AM  Result Value Ref Range   Sodium 138 135 - 145 mmol/L   Potassium 4.0 3.5 - 5.1 mmol/L   Chloride 98 96 - 112 mmol/L   CO2 28 19 - 32 mmol/L   Glucose, Bld 102 (H) 70 - 99 mg/dL   BUN 39 (H) 6 - 23 mg/dL   Creatinine, Ser 7.59 (H) 0.50 - 1.35 mg/dL   Calcium 8.7 8.4 - 10.5  mg/dL   Total Protein 5.4 (L) 6.0 - 8.3 g/dL   Albumin 2.7 (L) 3.5 - 5.2 g/dL   AST 24 0 - 37 U/L   ALT 12 0 - 53 U/L   Alkaline Phosphatase 66 39 - 117 U/L   Total Bilirubin 0.8 0.3 - 1.2 mg/dL   GFR calc non Af Amer 6 (L) >90 mL/min   GFR calc Af Amer 7 (L) >90 mL/min    Comment: (NOTE) The eGFR has been calculated using the CKD EPI equation. This calculation has not been validated in all clinical situations. eGFR's persistently <90 mL/min signify possible Chronic Kidney Disease.    Anion gap 12 5 - 15  Heparin level (unfractionated)     Status: None   Collection Time: 08/26/14  5:43 AM  Result Value Ref Range   Heparin Unfractionated 0.54 0.30 - 0.70 IU/mL    Comment:        IF HEPARIN RESULTS ARE BELOW EXPECTED VALUES, AND PATIENT DOSAGE HAS BEEN  CONFIRMED, SUGGEST FOLLOW UP TESTING OF ANTITHROMBIN III LEVELS.   Glucose, capillary     Status: Abnormal   Collection Time: 08/26/14  6:45 AM  Result Value Ref Range   Glucose-Capillary 110 (H) 70 - 99 mg/dL  Glucose, capillary     Status: Abnormal   Collection Time: 08/26/14 11:24 AM  Result Value Ref Range   Glucose-Capillary 137 (H) 70 - 99 mg/dL   Comment 1 Repeat Test    Comment 2 Document in Chart   Hepatitis B surface antigen     Status: None   Collection Time: 08/26/14  1:18 PM  Result Value Ref Range   Hepatitis B Surface Ag NEGATIVE NEGATIVE    Comment: Performed at [facility]  Heparin level (unfractionated)     Status: None   Collection Time: 08/26/14  2:34 PM  Result Value Ref Range   Heparin Unfractionated 0.64 0.30 - 0.70 IU/mL    Comment:        IF HEPARIN RESULTS ARE BELOW EXPECTED VALUES, AND PATIENT DOSAGE HAS BEEN CONFIRMED, SUGGEST FOLLOW UP TESTING OF ANTITHROMBIN III LEVELS.   Glucose, capillary     Status: None   Collection Time: 08/26/14  5:17 PM  Result Value Ref Range   Glucose-Capillary 70 70 - 99 mg/dL   Comment 1 Repeat Test    Comment 2 Document in Chart     RADIOGRAPHIC STUDIES:I have also personally reviewed 3 last CT scans from November 2015, December 2015 and January 2016. I have personally reviewed the radiological images as listed and agreed with the findings in the report. Dg Chest Port 1 View  08/25/2014   CLINICAL DATA:  Left arm pain.  Fever for 1 day.  EXAM: PORTABLE CHEST - 1 VIEW  COMPARISON:  05/08/2014  FINDINGS: Mild cardiomegaly is stable. Aortic and hilar contours are unchanged. Bilateral central venous stents, new on the right from prior. There is no edema, consolidation, effusion, or pneumothorax.  IMPRESSION: No active disease.   Electronically Signed   By: Monte Fantasia M.D.   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 I suspect that could be the cause of recurrent blood clots with possible stent occlusion. I suggest involvement of vascular surgery team. I do not see a reason to order excessive testing to screen for thrombophilia disorder as it  would not change our management.  The goal of anticoagulation therapy is for life. With limitation of choice of anticoagulants, He would need life-long treatment with warfarin. I suggest increasing the target goal to 2.5 to 3.0 with +/- addition of antiplatelet agents in view of stents. I agree overlap with heparin until INR is therapeutic. His PCP can continue INR monitoring and management in the out-patient setting  ESRD He will continue HD  Heart transplant on chronic immunosuppresive therapy Close monitoring by ID  Chronic thrombocytopenia Due to immunosuppressive therapy. Not contra-indication to remain on anticoagulation treatment as long as platelet count is >50,000  Anemia in chronic illness He is receiving Aranesp with HD  Will follow   Mentone, Malea Swilling, MD 08/26/2014 8:37 PM

## 2014-08-26 NOTE — Progress Notes (Signed)
INITIAL NUTRITION ASSESSMENT  DOCUMENTATION CODES Per approved criteria  -Not Applicable   INTERVENTION: Provide nourishment snack (chicken salad sandwich). Ordered for HS.  Encourage adequate PO intake.  NUTRITION DIAGNOSIS: Increased nutrient needs related to chronic illness, ESRD as evidenced by estimated nutrition needs.   Goal: Pt to meet >/= 90% of their estimated nutrition needs   Monitor:  PO intake, weight trends, labs, I/O's  Reason for Assessment: MST  69 y.o. male  Admitting Dx: <principal problem not specified>  ASSESSMENT: Pt with dm2, hypertension, cad, CHF (EF ), ESRD on HD (M, W, F),hx of heart transplant and failed renal transplant, c/o L arm pain. Pt had u/s, L subclavian DVT and nearly occluded R IJ.  Pt reports having a good appetite currently and PTA eating 3 full meals a day. No percent meal completion recorded, however pt reports 50% as pt did not like most of his food at breakfast. Weight has been stable. Pt was offered nutritional supplements, however declined. Pt is agreeable to nourishment snack. RD to order. Pt was encouraged to eat his food at meals.   Nutrition Focused Physical Exam:  Subcutaneous Fat:  Orbital Region: N/A Upper Arm Region: WNL Thoracic and Lumbar Region: N/A  Muscle:  Temple Region: N/A Clavicle Bone Region: WNL Clavicle and Acromion Bone Region: WNL Scapular Bone Region: N/A Dorsal Hand: N/A Patellar Region: Severe depletion Anterior Thigh Region: Severe depletion Posterior Calf Region: Severe depletion  Edema: L arm  Labs: Low GFR. High BUN and creatinine.  Height: Ht Readings from Last 1 Encounters:  08/25/14 6' (1.829 m)    Weight: Wt Readings from Last 1 Encounters:  08/26/14 159 lb 6.3 oz (72.3 kg)    Ideal Body Weight: 178 lbs  % Ideal Body Weight: 89%  Wt Readings from Last 10 Encounters:  08/26/14 159 lb 6.3 oz (72.3 kg)  06/21/14 159 lb (72.122 kg)  05/11/14 154 lb 1.6 oz (69.9 kg)   05/03/14 158 lb 8 oz (71.895 kg)  04/26/14 163 lb (73.936 kg)  04/15/14 157 lb 3 oz (71.3 kg)  04/09/14 160 lb 14.4 oz (72.984 kg)  03/30/14 153 lb 14.1 oz (69.8 kg)  12/14/13 165 lb (74.844 kg)  07/28/13 159 lb 2.8 oz (72.2 kg)    Usual Body Weight: 159 lbs  % Usual Body Weight: 100%  BMI:  Body mass index is 21.61 kg/(m^2).  Estimated Nutritional Needs: Kcal: 2000-2200 Protein: 95-110 grams Fluid: 1.2 L/day  Skin: Intact, L arm edema  Diet Order: Diet renal/carb modified with fluid restriction Diet-HS Snack?: Nothing; Room service appropriate?: Yes; Fluid consistency:: Thin  EDUCATION NEEDS: -No education needs identified at this time   Intake/Output Summary (Last 24 hours) at 08/26/14 0955 Last data filed at 08/26/14 0457  Gross per 24 hour  Intake    795 ml  Output      0 ml  Net    795 ml    Last BM: 3/31  Labs:   Recent Labs Lab 08/25/14 1536 08/25/14 1549 08/26/14 0332  NA 139 137 138  K 3.6 3.5 4.0  CL 96 97 98  CO2 28  --  28  BUN 31* 32* 39*  CREATININE 6.60* 6.30* 7.59*  CALCIUM 9.2  --  8.7  GLUCOSE 126* 128* 102*    CBG (last 3)   Recent Labs  08/25/14 2233 08/26/14 0645  GLUCAP 73 110*    Scheduled Meds: . sodium chloride   Intravenous STAT  . atorvastatin  40 mg  Oral Daily  . azaTHIOprine  75 mg Oral q morning - 10a  . calcium acetate  667 mg Oral TID WC  . carvedilol  6.25 mg Oral QHS  . colesevelam  3,750 mg Oral Daily  . famotidine  10 mg Oral QHS  . insulin aspart  0-5 Units Subcutaneous QHS  . insulin aspart  0-9 Units Subcutaneous TID WC  . insulin glargine  20 Units Subcutaneous q morning - 10a  . isosorbide dinitrate  40 mg Oral TID  . predniSONE  10 mg Oral Q breakfast  . sirolimus  2 mg Oral Daily  . sodium chloride  3 mL Intravenous Q12H  . sodium chloride  3 mL Intravenous Q12H    Continuous Infusions: . heparin 1,200 Units/hr (08/25/14 2123)    Past Medical History  Diagnosis Date  . Diabetes  mellitus   . Hypertension   . Myocardial infarction 1985; 1990  . Angina   . Coronary artery disease   . Dysrhythmia   . DVT (deep venous thrombosis) ~ 12/2010    LLE  . Anemia   . Blood transfusion 06/1999    post heart transplant  . Peripheral vascular disease   . CHF (congestive heart failure) 2001    beffore transplant  . Renal failure     Hemodialysis MWF last 2 years, sse dr [name] nephrology, goes to [place] [facility]  . DVT (deep venous thrombosis) 08/25/2014    Nearly occluded R IJ, and L subclavian DVT    Past Surgical History  Procedure Laterality Date  . Nephrectomy transplanted organ  2008  . Av fistula repair      rt. a=fore arm fistula  . Av fistula placement  ~ 01/2010    "this was my 2nd fistula placement"  . Heart transplant  06/1999  . Coronary angioplasty with stent placement  1985; 1990  . Colonoscopy  03/17/2012    Procedure: COLONOSCOPY;  Surgeon: Lear Ng, MD;  Location: [facility];  Service: Endoscopy;  Laterality: N/A;  . Coronary artery bypass graft  1990    CABG X3  . Colonoscopy with propofol N/A 12/14/2013    Procedure: COLONOSCOPY WITH PROPOFOL;  Surgeon: Lear Ng, MD;  Location: [facility];  Service: Endoscopy;  Laterality: N/A;  . Video bronchoscopy Bilateral 04/14/2014    Procedure: VIDEO BRONCHOSCOPY WITH FLUORO;  Surgeon: Collene Gobble, MD;  Location: [place];  Service: Cardiopulmonary;  Laterality: Bilateral;  . Shuntogram Right 05/09/2014    Procedure: FISTULOGRAM;  Surgeon: Conrad [place], MD;  Location: [facility] CATH LAB;  Service: Cardiovascular;  Laterality: Right;    Kallie Locks, MS, RD, LDN Pager # [phone] After hours/ weekend pager # [phone]

## 2014-08-26 NOTE — Consult Note (Signed)
Indication for Consultation:  Management of ESRD/hemodialysis; anemia, hypertension/volume and secondary hyperparathyroidism  HPI: Brandon Sharp is a 69 y.o. male who presented to the ED yesterday with complaints of left arm pain and swelling.  ESRD on HD MWF @ [facility]. History of DM, HTN, heart transplant and  failed renal transplant and R IJ DVT in December- on coumadin. He woke up yesterday and noticed left arm was painful and swollen, he could not make a fist with left hand d/t swelling. He checked his temp at home and reports it was 100.5, he reported to the ED for further evaluation. Venous duplex showed partial acute DVT of left subclavian and nearly occluded acute DVT of R jugular vein. INR was 2.44. He was admitted for management of DVT and L arm cellulitis, will arrange HD while in the hospital. Seen on HD- doing well except arrm-    Past Medical History  Diagnosis Date  . Diabetes mellitus   . Hypertension   . Myocardial infarction 1985; 1990  . Angina   . Coronary artery disease   . Dysrhythmia   . DVT (deep venous thrombosis) ~ 12/2010    LLE  . Anemia   . Blood transfusion 06/1999    post heart transplant  . Peripheral vascular disease   . CHF (congestive heart failure) 2001    beffore transplant  . Renal failure     Hemodialysis MWF last 2 years, sse dr [name] nephrology, goes to [facility]  . DVT (deep venous thrombosis) 08/25/2014    Nearly occluded R IJ, and L subclavian DVT   Past Surgical History  Procedure Laterality Date  . Nephrectomy transplanted organ  2008  . Av fistula repair      rt. a=fore arm fistula  . Av fistula placement  ~ 01/2010    "this was my 2nd fistula placement"  . Heart transplant  06/1999  . Coronary angioplasty with stent placement  1985; 1990  . Colonoscopy  03/17/2012    Procedure: COLONOSCOPY;  Surgeon: Lear Ng, MD;  Location: [facility];  Service: Endoscopy;  Laterality: N/A;  . Coronary artery bypass  graft  1990    CABG X3  . Colonoscopy with propofol N/A 12/14/2013    Procedure: COLONOSCOPY WITH PROPOFOL;  Surgeon: Lear Ng, MD;  Location: [facility];  Service: Endoscopy;  Laterality: N/A;  . Video bronchoscopy Bilateral 04/14/2014    Procedure: VIDEO BRONCHOSCOPY WITH FLUORO;  Surgeon: Collene Gobble, MD;  Location: [facility];  Service: Cardiopulmonary;  Laterality: Bilateral;  . Shuntogram Right 05/09/2014    Procedure: FISTULOGRAM;  Surgeon: Conrad Ucon, MD;  Location: [facility] CATH LAB;  Service: Cardiovascular;  Laterality: Right;   Family History  Problem Relation Age of Onset  . Heart disease Mother   . Hyperlipidemia Mother   . Hypertension Mother   . Hyperlipidemia Father   . Hypertension Father   . Diabetes Brother   . Heart disease Brother     Heart Disease before age 60  . Hyperlipidemia Brother   . Hypertension Brother    Social History:  reports that he quit smoking about 26 years ago. His smoking use included Cigarettes. He has a 20 pack-year smoking history. He has never used smokeless tobacco. He reports that he drinks alcohol. He reports that he does not use illicit drugs. Allergies  Allergen Reactions  . Lisinopril Swelling    Lips and tongue swell  . Niacin And Related Other (See Comments)  unknown  . Norvasc [Amlodipine Besylate] Rash    Flushing  . Penicillins Rash   Prior to Admission medications   Medication Sig Start Date End Date Taking? Authorizing Provider  atorvastatin (LIPITOR) 40 MG tablet Take 40 mg by mouth daily.     Yes Historical Provider, MD  azaTHIOprine (IMURAN) 50 MG tablet Take 75 mg by mouth every morning.    Yes Historical Provider, MD  b complex-vitamin c-folic acid (NEPHRO-VITE) 0.8 MG TABS tablet Take 1 tablet by mouth at bedtime.   Yes Historical Provider, MD  calcium acetate (PHOSLO) 667 MG capsule Take 667 mg by mouth 3 (three) times daily with meals.  11/01/12  Yes Historical Provider, MD  carvedilol (COREG) 6.25  MG tablet Take 1 tablet (6.25 mg total) by mouth at bedtime. 04/15/14  Yes Geradine Girt, DO  colesevelam (WELCHOL) 625 MG tablet Take 3,750 mg by mouth daily. Takes 6 tabs daily   Yes Historical Provider, MD  famotidine (PEPCID AC) 10 MG chewable tablet Chew 10 mg by mouth at bedtime.    Yes Historical Provider, MD  HUMALOG KWIKPEN 100 UNIT/ML SOPN Inject 6-10 Units into the skin 2 (two) times daily. Per sliding scale 11/17/12  Yes Historical Provider, MD  insulin glargine (LANTUS) 100 UNIT/ML injection Inject 20 Units into the skin every morning.   Yes Historical Provider, MD  isosorbide dinitrate (ISORDIL) 40 MG tablet Take 40 mg by mouth 3 (three) times daily.     Yes Historical Provider, MD  predniSONE (DELTASONE) 10 MG tablet Take 1 tablet (10 mg total) by mouth daily with breakfast. 04/15/14  Yes Geradine Girt, DO  sirolimus (RAPAMUNE) 2 MG tablet Take 2 mg by mouth daily.   Yes Historical Provider, MD  warfarin (COUMADIN) 5 MG tablet Take 1 tablet (5 mg total) by mouth daily. Patient taking differently: Take 5 mg by mouth daily. Take 5mg  mon wed Friday Saturday and Sunday. Rest of days take 2.5mg  tab 05/11/14  Yes Shanker Kristeen Mans, MD  enoxaparin (LOVENOX) 40 MG/0.4ML injection Inject 0.7 mLs (70 mg total) into the skin daily. Patient not taking: Reported on 06/21/2014 05/11/14   Jonetta Osgood, MD  Nutritional Supplements (FEEDING SUPPLEMENT, NEPRO CARB STEADY,) LIQD Take 237 mLs by mouth as needed (missed meal during dialysis.). Patient not taking: Reported on 06/21/2014 05/11/14   Jonetta Osgood, MD   Current Facility-Administered Medications  Medication Dose Route Frequency Provider Last Rate Last Dose  . 0.9 %  sodium chloride infusion   Intravenous STAT Fredia Sorrow, MD      . 0.9 %  sodium chloride infusion  250 mL Intravenous PRN Jani Gravel, MD      . acetaminophen (TYLENOL) tablet 650 mg  650 mg Oral Q6H PRN Jani Gravel, MD       Or  . acetaminophen (TYLENOL) suppository  650 mg  650 mg Rectal Q6H PRN Jani Gravel, MD      . atorvastatin (LIPITOR) tablet 40 mg  40 mg Oral Daily Jani Gravel, MD      . azaTHIOprine [facility]) tablet 75 mg  75 mg Oral q morning - 10a Jani Gravel, MD   75 mg at 08/26/14 0018  . calcium acetate (PHOSLO) capsule 667 mg  667 mg Oral TID WC Jani Gravel, MD   667 mg at 08/26/14 1048  . carvedilol (COREG) tablet 6.25 mg  6.25 mg Oral QHS Jani Gravel, MD   6.25 mg at 08/26/14 0017  . colesevelam [facility]) tablet  3,750 mg  3,750 mg Oral Daily Jani Gravel, MD      . famotidine (PEPCID) tablet 10 mg  10 mg Oral QHS Jani Gravel, MD   10 mg at 08/26/14 0018  . heparin ADULT infusion 100 units/mL (25000 units/250 mL)  1,200 Units/hr Intravenous Continuous Darnell Level Mancheril, RPH 12 mL/hr at 08/25/14 2123 1,200 Units/hr at 08/25/14 2123  . insulin aspart (novoLOG) injection 0-5 Units  0-5 Units Subcutaneous QHS Jani Gravel, MD   0 Units at 08/25/14 2317  . insulin aspart (novoLOG) injection 0-9 Units  0-9 Units Subcutaneous TID WC Jani Gravel, MD      . insulin glargine (LANTUS) injection 20 Units  20 Units Subcutaneous q morning - 10a Jani Gravel, MD      . isosorbide dinitrate (ISORDIL) tablet 40 mg  40 mg Oral TID Jani Gravel, MD   40 mg at 08/26/14 0018  . predniSONE (DELTASONE) tablet 10 mg  10 mg Oral Q breakfast Jani Gravel, MD   10 mg at 08/26/14 0017  . sirolimus (RAPAMUNE) tablet 2 mg  2 mg Oral Daily Jani Gravel, MD   2 mg at 08/26/14 0017  . sodium chloride 0.9 % injection 3 mL  3 mL Intravenous Q12H Jani Gravel, MD      . sodium chloride 0.9 % injection 3 mL  3 mL Intravenous Q12H Jani Gravel, MD      . sodium chloride 0.9 % injection 3 mL  3 mL Intravenous PRN Jani Gravel, MD       Labs: Basic Metabolic Panel:  Recent Labs Lab 08/25/14 1536 08/25/14 1549 08/26/14 0332  NA 139 137 138  K 3.6 3.5 4.0  CL 96 97 98  CO2 28  --  28  GLUCOSE 126* 128* 102*  BUN 31* 32* 39*  CREATININE 6.60* 6.30* 7.59*  CALCIUM 9.2  --  8.7   Liver Function Tests:  Recent  Labs Lab 08/25/14 1536 08/26/14 0332  AST 29 24  ALT 16 12  ALKPHOS 82 66  BILITOT 0.8 0.8  PROT 6.2 5.4*  ALBUMIN 3.0* 2.7*    Recent Labs Lab 08/25/14 1536  LIPASE 34   No results for input(s): AMMONIA in the last 168 hours. CBC:  Recent Labs Lab 08/25/14 1536 08/25/14 1549 08/26/14 0332  WBC 8.1  --  6.8  NEUTROABS 5.7  --   --   HGB 11.9* 12.9* 10.0*  HCT 37.7* 38.0* 31.9*  MCV 97.2  --  95.8  PLT 87*  --  79*   Cardiac Enzymes: No results for input(s): CKTOTAL, CKMB, CKMBINDEX, TROPONINI in the last 168 hours. CBG:  Recent Labs Lab 08/25/14 2233 08/26/14 0645  GLUCAP 73 110*   Iron Studies: No results for input(s): IRON, TIBC, TRANSFERRIN, FERRITIN in the last 72 hours. Studies/Results: Dg Chest Port 1 View  08/25/2014   CLINICAL DATA:  Left arm pain.  Fever for 1 day.  EXAM: PORTABLE CHEST - 1 VIEW  COMPARISON:  05/08/2014  FINDINGS: Mild cardiomegaly is stable. Aortic and hilar contours are unchanged. Bilateral central venous stents, new on the right from prior. There is no edema, consolidation, effusion, or pneumothorax.  IMPRESSION: No active disease.   Electronically Signed   By: Monte Fantasia M.D.   On: 08/25/2014 15:55    Review of Systems: Reports feeling well, Denies fever/chills except for low grade temp of 100.5 yesterday. Denies chest pain and sob Positive for L arm pain and swelling, feels swelling improving this  AM.  Physical Exam: Filed Vitals:   08/25/14 2130 08/25/14 2145 08/25/14 2222 08/26/14 0540  BP: 131/64 121/44 148/67 113/42  Pulse: 88 85 92 89  Temp:   99 F (37.2 C) 99.3 F (37.4 C)  TempSrc:   Oral Oral  Resp: 11 13 15 16   Height:      Weight:    72.3 kg (159 lb 6.3 oz)  SpO2: 93% 95% 95% 100%     General: Well developed, well nourished, in no acute distress. Head: Normocephalic, atraumatic, sclera non-icteric, mucus membranes are moist Neck: Supple. JVD not elevated. Lungs: Clear bilaterally to auscultation  without wheezes, rales, or rhonchi. Breathing is unlabored. Heart: RRR with S1 S2. No murmurs, rubs, or gallops appreciated. Abdomen: Soft, non-tender, non-distended with normoactive bowel sounds. No rebound/guarding. No obvious abdominal masses. M-S:  Strength and tone appear normal for age. L arm edema/erythema and warmth. No tenderness.  Lower extremities:without edema or ischemic changes, no open wounds  Neuro: Alert and oriented X 3. Moves all extremities spontaneously. Psych:  Responds to questions appropriately with a normal affect. Dialysis Access: R AVF +b/t  Dialysis Orders: MWF [facility] 4hr   72kgs   2K/2.25Ca   400/1.5  R AVF 3000u heparin Aranesp 100 q weds    4 hectorol   Assessment/Plan: 1.  LUA and r IJ DVT- history of DVT and on coumadin. INR was therapeutic. hypercoag panel pending. Heparin gtt. Hematology consulted 2. Cellulitis- vanc and  cefepime. Blood cultures pending. Afebrile- immune supression makes him more susceptible to infection 3.  ESRD -  MWF via [facility]  [facility], HD pending today K+4 4.  Hypertension/volume  - 113/42. On carvedilol 6.25. Getting to EDW. uf goal usually around 3L 5.  Anemia  - hgb 10, cont Aranesp q wends. No Fe 6.  Metabolic bone disease -  Ca+ 8.7/ last phos 5.1 and PTH 489 3/23- cont hectorol/phoslo 7.  Nutrition - renal diet. Vitamin. alb 2.7 8. DM- per primary 9. S/p heart transplant- on imuran and sirolimus- prednisone was actually added at the time of renal tranplant but has stayed on   Shelle Iron, NP [place] [phone] 08/26/2014, 10:51 AM   Patient seen and examined, agree with above note with above modifications. Seen on HD- doing well except arm- getting antibiotics, also DVT with therapeutic INR- getting heme to see - HD today per usual schedule Corliss Parish, MD 08/26/2014

## 2014-08-26 NOTE — Progress Notes (Signed)
PROGRESS NOTE  Brandon Sharp YBO:[id number] DOB: 01-Apr-1946 DOA: 08/25/2014 PCP: [name], MD  Brief History 69 y/o male with hx of ESRD, heart transplant ([facility] 2001) and failed renal transplant, diabetes mellitus, hypertension, CAD status post CABG presents with one-day history of left upper approximately swelling and erythema. The patient had a fever of 100.58F at home. The patient had history of left lower extremity DVT in August 2012. He was treated with warfarin for approximately 3-6 months at that time. The patient had another DVT of the right IJ vein on 05/06/2014. The patient was started back on warfarin. He has been compliant. INR was 2.44 at the time of admission. Venous duplex revealed new left subclavian acute DVT and persistent right IJ DVT.  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 Alvy Bimler -Continue heparin drip pending hematology evaluation -Monitor for signs of bleeding Cellulitis left upper extremity -Continue empiric vancomycin and Zosyn pending culture data ESRD -Consulted nephrology for maintenance dialysis--spoke with Dr. Moshe Cipro -saline lock IVF CAD with history of CABG -Continue carvedilol, Lipitor, WelChol, Isordil Diabetes mellitus type 2 -Hemoglobin A1c 04/12/2014--7.2 -Continue Lantus 20 units daily with NovoLog sliding scale History of heart transplant and failed renal transplant -Continue Imuran, sirolimus, prednisone Thrombocytopenia  -chronic, likely due to immunosuppressives and coumadin -monitor for signs of bleeding   Family Communication:   Pt at beside Disposition Plan:   Home when medically stable         Procedures/Studies: Dg Chest Port 1 View  08/25/2014   CLINICAL DATA:  Left arm pain.  Fever for 1 day.  EXAM: PORTABLE CHEST - 1 VIEW  COMPARISON:   05/08/2014  FINDINGS: Mild cardiomegaly is stable. Aortic and hilar contours are unchanged. Bilateral central venous stents, new on the right from prior. There is no edema, consolidation, effusion, or pneumothorax.  IMPRESSION: No active disease.   Electronically Signed   By: Monte Fantasia M.D.   On: 08/25/2014 15:55         Subjective:  Patient states that left upper extremity is feeling a little bit better than yesterday. Patient denies fevers, chills, headache, chest pain, dyspnea, nausea, vomiting, diarrhea, abdominal pain, dysuria, hematuria   Objective: Filed Vitals:   08/25/14 2130 08/25/14 2145 08/25/14 2222 08/26/14 0540  BP: 131/64 121/44 148/67 113/42  Pulse: 88 85 92 89  Temp:   99 F (37.2 C) 99.3 F (37.4 C)  TempSrc:   Oral Oral  Resp: 11 13 15 16   Height:      Weight:    72.3 kg (159 lb 6.3 oz)  SpO2: 93% 95% 95% 100%    Intake/Output Summary (Last 24 hours) at 08/26/14 0815 Last data filed at 08/26/14 0457  Gross per 24 hour  Intake    795 ml  Output      0 ml  Net    795 ml   Weight change:  Exam:   General:  Pt is alert, follows commands appropriately, not in acute distress  HEENT: No icterus, No thrush,  [employer]/AT  Cardiovascular: RRR, S1/S2, no rubs, no gallops  Respiratory: CTA bilaterally, no wheezing, no crackles, no rhonchi  Abdomen: Soft/+BS, non tender, non distended, no guarding  Extremities: 2+ edema in the bilateral upper extremities. There is erythema extending from the patient's left elbow to his wrist. Palpable radial pulse  on the left upper extremity. Positive thrill on the right upper extremity fistula   Data Reviewed: Basic Metabolic Panel:  Recent Labs Lab 08/25/14 1536 08/25/14 1549 08/26/14 0332  NA 139 137 138  K 3.6 3.5 4.0  CL 96 97 98  CO2 28  --  28  GLUCOSE 126* 128* 102*  BUN 31* 32* 39*  CREATININE 6.60* 6.30* 7.59*  CALCIUM 9.2  --  8.7   Liver Function Tests:  Recent Labs Lab 08/25/14 1536  08/26/14 0332  AST 29 24  ALT 16 12  ALKPHOS 82 66  BILITOT 0.8 0.8  PROT 6.2 5.4*  ALBUMIN 3.0* 2.7*    Recent Labs Lab 08/25/14 1536  LIPASE 34   No results for input(s): AMMONIA in the last 168 hours. CBC:  Recent Labs Lab 08/25/14 1536 08/25/14 1549 08/26/14 0332  WBC 8.1  --  6.8  NEUTROABS 5.7  --   --   HGB 11.9* 12.9* 10.0*  HCT 37.7* 38.0* 31.9*  MCV 97.2  --  95.8  PLT 87*  --  79*   Cardiac Enzymes: No results for input(s): CKTOTAL, CKMB, CKMBINDEX, TROPONINI in the last 168 hours. BNP: Invalid input(s): POCBNP CBG:  Recent Labs Lab 08/25/14 2233 08/26/14 0645  GLUCAP 73 110*    Recent Results (from the past 240 hour(s))  Culture, blood (routine x 2)     Status: None (Preliminary result)   Collection Time: 08/25/14  3:44 PM  Result Value Ref Range Status   Specimen Description BLOOD LEFT ARM  Final   Special Requests BOTTLES DRAWN AEROBIC AND ANAEROBIC 10CC  Final   Culture   Final           BLOOD CULTURE RECEIVED NO GROWTH TO DATE CULTURE WILL BE HELD FOR 5 DAYS BEFORE ISSUING A FINAL NEGATIVE REPORT Performed at [facility]    Report Status PENDING  Incomplete     Scheduled Meds: . sodium chloride   Intravenous STAT  . atorvastatin  40 mg Oral Daily  . azaTHIOprine  75 mg Oral q morning - 10a  . calcium acetate  667 mg Oral TID WC  . carvedilol  6.25 mg Oral QHS  . colesevelam  3,750 mg Oral Daily  . famotidine  10 mg Oral QHS  . insulin aspart  0-5 Units Subcutaneous QHS  . insulin aspart  0-9 Units Subcutaneous TID WC  . insulin glargine  20 Units Subcutaneous q morning - 10a  . isosorbide dinitrate  40 mg Oral TID  . predniSONE  10 mg Oral Q breakfast  . sirolimus  2 mg Oral Daily  . sodium chloride  3 mL Intravenous Q12H  . sodium chloride  3 mL Intravenous Q12H   Continuous Infusions: . sodium chloride 100 mL/hr at 08/26/14 0458  . heparin 1,200 Units/hr (08/25/14 2123)     Katrese Shell, DO  [facility] Pager [phone]  If 7PM-7AM, please contact night-coverage [email or web address] Password [email or web address] 08/26/2014, 8:15 AM   LOS: 1 day

## 2014-08-26 NOTE — Procedures (Signed)
Patient was seen on dialysis and the procedure was supervised.  BFR 400  Via AVF BP is  91/45.   Patient appears to be tolerating treatment well- taking BP on leg so likely not reliable- goal only 2300   Jaline Pincock A 08/26/2014

## 2014-08-26 NOTE — Progress Notes (Signed)
Utilization review completed. Iveliz Garay, RN, BSN. 

## 2014-08-27 LAB — CBC
HEMATOCRIT: 29.2 % — AB (ref 39.0–52.0)
Hemoglobin: 9.2 g/dL — ABNORMAL LOW (ref 13.0–17.0)
MCH: 30.4 pg (ref 26.0–34.0)
MCHC: 31.5 g/dL (ref 30.0–36.0)
MCV: 96.4 fL (ref 78.0–100.0)
PLATELETS: 71 10*3/uL — AB (ref 150–400)
RBC: 3.03 MIL/uL — AB (ref 4.22–5.81)
RDW: 16.9 % — AB (ref 11.5–15.5)
WBC: 4.3 10*3/uL (ref 4.0–10.5)

## 2014-08-27 LAB — GLUCOSE, CAPILLARY
GLUCOSE-CAPILLARY: 60 mg/dL — AB (ref 70–99)
GLUCOSE-CAPILLARY: 89 mg/dL (ref 70–99)
Glucose-Capillary: 75 mg/dL (ref 70–99)
Glucose-Capillary: 70 mg/dL (ref 70–99)
Glucose-Capillary: 94 mg/dL (ref 70–99)

## 2014-08-27 LAB — HEPARIN LEVEL (UNFRACTIONATED): Heparin Unfractionated: 0.55 IU/mL (ref 0.30–0.70)

## 2014-08-27 LAB — PROTIME-INR
INR: 2.29 — AB (ref 0.00–1.49)
Prothrombin Time: 25.4 seconds — ABNORMAL HIGH (ref 11.6–15.2)

## 2014-08-27 MED ORDER — PREDNISONE 5 MG PO TABS
5.0000 mg | ORAL_TABLET | Freq: Every day | ORAL | Status: DC
  Administered 2014-08-28 – 2014-08-29 (×2): 5 mg via ORAL
  Filled 2014-08-27 (×2): qty 1

## 2014-08-27 MED ORDER — INSULIN GLARGINE 100 UNIT/ML ~~LOC~~ SOLN
10.0000 [IU] | Freq: Every morning | SUBCUTANEOUS | Status: DC
  Filled 2014-08-27: qty 0.1

## 2014-08-27 MED ORDER — INSULIN GLARGINE 100 UNIT/ML ~~LOC~~ SOLN
5.0000 [IU] | Freq: Every morning | SUBCUTANEOUS | Status: DC
  Administered 2014-08-27 – 2014-08-29 (×3): 5 [IU] via SUBCUTANEOUS
  Filled 2014-08-27 (×3): qty 0.05

## 2014-08-27 MED ORDER — WARFARIN SODIUM 5 MG PO TABS
5.0000 mg | ORAL_TABLET | Freq: Every day | ORAL | Status: DC
  Administered 2014-08-28: 5 mg via ORAL
  Filled 2014-08-27: qty 1

## 2014-08-27 MED ORDER — DIPHENHYDRAMINE HCL 25 MG PO CAPS
25.0000 mg | ORAL_CAPSULE | Freq: Four times a day (QID) | ORAL | Status: DC | PRN
  Administered 2014-08-27 – 2014-08-28 (×3): 25 mg via ORAL
  Filled 2014-08-27 (×4): qty 1

## 2014-08-27 MED ORDER — WARFARIN SODIUM 7.5 MG PO TABS
7.5000 mg | ORAL_TABLET | Freq: Once | ORAL | Status: AC
  Administered 2014-08-27: 7.5 mg via ORAL
  Filled 2014-08-27: qty 1

## 2014-08-27 NOTE — Progress Notes (Signed)
Subjective:    Stable overnight- removed 1800 with HD with low BP- BP was higher last night but OK this AM- "im ready to go home" Objective Vital signs in last 24 hours: Filed Vitals:   08/26/14 1646 08/26/14 1720 08/26/14 2054 08/27/14 0600  BP: 100/73 159/70 152/48 120/46  Pulse: 78 70 84 76  Temp: 97.7 F (36.5 C) 98.1 F (36.7 C) 99 F (37.2 C) 98 F (36.7 C)  TempSrc: Oral   Oral  Resp: 15 16 16 16   Height:      Weight: 72 kg (158 lb 11.7 oz)   76.1 kg (167 lb 12.3 oz)  SpO2: 98% 100% 97% 100%   Weight change: 1.406 kg (3 lb 1.6 oz)  Intake/Output Summary (Last 24 hours) at 08/27/14 0850 Last data filed at 08/26/14 2300  Gross per 24 hour  Intake    540 ml  Output   1800 ml  Net  -1260 ml    Dialysis Orders: MWF [facility] 4hr 72kgs 2K/2.25Ca 400/1.5 R AVF 3000u heparin Aranesp 100 q weds 4 hectorol   Assessment/Plan: 1. LUA and r IJ DVT- history of DVT and on coumadin. INR was therapeutic. hypercoag panel pending. Heparin gtt. 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 Cellulitis- vanc and cefepime. Blood cultures pending. Looks better clinicallyAfebrile- immune supression makes him more susceptible to infection 3. ESRD - MWF via [facility]F [facility], HD next Monday K+4 4. Hypertension/volume - 113/42. On carvedilol 6.25. Getting to EDW. uf goal usually around 3L 5. Anemia - hgb 10, cont Aranesp q wends. No Fe 6. Metabolic bone disease - Ca+ 8.7/ last phos 5.1 and PTH 489 3/23- cont hectorol/phoslo 7. Nutrition - renal diet. Vitamin. alb 2.7 8. DM- per primary 9. S/p heart transplant- on imuran and sirolimus- prednisone was actually added at the time of renal tranplant but has stayed on- I am going to take down to 7.5 (he wanted to take it down to 5 and will tell us if he has ill effects from this)  and will continue slow wean as OP  10. Dispo- renal is OK with discharge- per primary team- we can give IV abx with HD  if you think are needed- call Shelton Silvas at [phone] to arrange over the weekend - otherwise could change to something PO      Tavares Levinson A    Labs: Basic Metabolic Panel:  Recent Labs Lab 08/25/14 1536 08/25/14 1549 08/26/14 0332  NA 139 137 138  K 3.6 3.5 4.0  CL 96 97 98  CO2 28  --  28  GLUCOSE 126* 128* 102*  BUN 31* 32* 39*  CREATININE 6.60* 6.30* 7.59*  CALCIUM 9.2  --  8.7   Liver Function Tests:  Recent Labs Lab 08/25/14 1536 08/26/14 0332  AST 29 24  ALT 16 12  ALKPHOS 82 66  BILITOT 0.8 0.8  PROT 6.2 5.4*  ALBUMIN 3.0* 2.7*    Recent Labs Lab 08/25/14 1536  LIPASE 34   No results for input(s): AMMONIA in the last 168 hours. CBC:  Recent Labs Lab 08/25/14 1536 08/25/14 1549 08/26/14 0332 08/27/14 0425  WBC 8.1  --  6.8 4.3  NEUTROABS 5.7  --   --   --   HGB 11.9* 12.9* 10.0* 9.2*  HCT 37.7* 38.0* 31.9* 29.2*  MCV 97.2  --  95.8 96.4  PLT 87*  --  79* 71*   Cardiac Enzymes: No results for input(s):  CKTOTAL, CKMB, CKMBINDEX, TROPONINI in the last 168 hours. CBG:  Recent Labs Lab 08/26/14 1124 08/26/14 1717 08/26/14 2117 08/27/14 0643 08/27/14 0711  GLUCAP 137* 70 170* 60* 75    Iron Studies: No results for input(s): IRON, TIBC, TRANSFERRIN, FERRITIN in the last 72 hours. Studies/Results: Dg Chest Port 1 View  08/25/2014   CLINICAL DATA:  Left arm pain.  Fever for 1 day.  EXAM: PORTABLE CHEST - 1 VIEW  COMPARISON:  05/08/2014  FINDINGS: Mild cardiomegaly is stable. Aortic and hilar contours are unchanged. Bilateral central venous stents, new on the right from prior. There is no edema, consolidation, effusion, or pneumothorax.  IMPRESSION: No active disease.   Electronically Signed   By: Monte Fantasia M.D.   On: 08/25/2014 15:55   Medications: Infusions: . heparin 1,200 Units/hr (08/26/14 1747)    Scheduled Medications: . atorvastatin  40 mg Oral Daily  . azaTHIOprine  75 mg Oral q morning - 10a  . calcium acetate   667 mg Oral TID WC  . carvedilol  6.25 mg Oral QHS  . [START ON 08/29/2014] ceFEPime (MAXIPIME) IV  2 g Intravenous Q M,W,F-HD  . colesevelam  3,750 mg Oral Daily  . [START ON 08/31/2014] darbepoetin (ARANESP) injection - DIALYSIS  100 mcg Intravenous Q Wed-HD  . doxercalciferol  4 mcg Intravenous Q M,W,F-HD  . famotidine  10 mg Oral QHS  . insulin aspart  0-5 Units Subcutaneous QHS  . insulin aspart  0-9 Units Subcutaneous TID WC  . insulin glargine  20 Units Subcutaneous q morning - 10a  . isosorbide dinitrate  40 mg Oral TID  . multivitamin  1 tablet Oral QHS  . predniSONE  10 mg Oral Q breakfast  . sirolimus  2 mg Oral Daily  . sodium chloride  3 mL Intravenous Q12H  . sodium chloride  3 mL Intravenous Q12H  . [START ON 08/29/2014] vancomycin  750 mg Intravenous Q M,W,F-HD  . [START ON 08/30/2014] warfarin  2.5 mg Oral Once per day on Tue Thu  . warfarin  5 mg Oral Once per day on Sun Mon Wed Fri Sat  . Warfarin - Pharmacist Dosing Inpatient   Does not apply q1800    have reviewed scheduled and prn medications.  Physical Exam: General: NAD Heart: RRR Lungs: mostly clear Abdomen: soft, non tender Extremities: no peripheral edema other than left arm- but looks clinically improved Dialysis Access: right upper arm AVF     08/27/2014,8:50 AM  LOS: 2 days

## 2014-08-27 NOTE — Progress Notes (Signed)
PROGRESS NOTE  Brandon Sharp:[id number] DOB: August 18, 1945 DOA: 08/25/2014 PCP: [name], MD   Brief History 69 y/o male with hx of ESRD, heart transplant ([facility] 2001) and failed renal transplant, diabetes mellitus, hypertension, CAD status post CABG presents with one-day history of left upper approximately swelling and erythema. The patient had a fever of 100.13F at home. The patient had history of left lower extremity DVT in August 2012. He was treated with warfarin for approximately 3-6 months at that time. The patient had another DVT of the right IJ vein on 05/06/2014. The patient was started back on warfarin. He has been compliant. INR was 2.44 at the time of admission. Venous duplex revealed new left subclavian acute DVT and persistent right IJ DVT.  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 Moshe Cipro -saline lock IVF -decrease prednisone per renal CAD with history of CABG -Continue carvedilol, Lipitor, WelChol, Isordil Diabetes mellitus type 2 -Hemoglobin A1c 04/12/2014--7.2 -Decrease Lantus 5 units daily due to mild hypoglycemia  -NovoLog sliding scale History of heart transplant and failed renal transplant -Continue Imuran, sirolimus, prednisone  -prednisone to 5mg  per renal Thrombocytopenia  -chronic, likely due to immunosuppressives and coumadin -monitor for signs of bleeding   Family Communication: Pt at beside Disposition Plan: Home when medically stable  Procedures/Studies: Dg Chest  Port 1 View  08/25/2014   CLINICAL DATA:  Left arm pain.  Fever for 1 day.  EXAM: PORTABLE CHEST - 1 VIEW  COMPARISON:  05/08/2014  FINDINGS: Mild cardiomegaly is stable. Aortic and hilar contours are unchanged. Bilateral central venous stents, new on the right from prior. There is no edema, consolidation, effusion, or pneumothorax.  IMPRESSION: No active disease.   Electronically Signed   By: Monte Fantasia M.D.   On: 08/25/2014 15:55        Subjective: Patient denies fevers, chills, headache, chest pain, dyspnea, nausea, vomiting, diarrhea, abdominal pain, dysuria, hematuria. Patient states that left arm pain is better but swelling is now much better.   Objective: Filed Vitals:   08/26/14 1720 08/26/14 2054 08/27/14 0600 08/27/14 1409  BP: 159/70 152/48 120/46 130/69  Pulse: 70 84 76 80  Temp: 98.1 F (36.7 C) 99 F (37.2 C) 98 F (36.7 C) 98.5 F (36.9 C)  TempSrc:   Oral Oral  Resp: 16 16 16 18   Height:      Weight:   76.1 kg (167 lb 12.3 oz)   SpO2: 100% 97% 100% 98%    Intake/Output Summary (Last 24 hours) at 08/27/14 1528 Last data filed at 08/27/14 8119  Gross per 24 hour  Intake    780 ml  Output   1800 ml  Net  -1020 ml   Weight change: 1.406 kg (3 lb 1.6 oz) Exam:   General:  Pt is alert, follows commands appropriately, not in acute distress  HEENT: No icterus, No thrush,  [place]/AT  Cardiovascular: RRR, S1/S2, no rubs, no gallops  Respiratory: CTA bilaterally, no wheezing, no crackles, no rhonchi  Abdomen: Soft/+BS, non tender, non distended, no guarding  Extremities: Bilateral upper extremity edema left greater than  right. No crepitance. Capillary refill less than 2 seconds bilaterally.   Data Reviewed: Basic Metabolic Panel:  Recent Labs Lab 08/25/14 1536 08/25/14 1549 08/26/14 0332  NA 139 137 138  K 3.6 3.5 4.0  CL 96 97 98  CO2 28  --  28  GLUCOSE 126* 128* 102*  BUN 31* 32* 39*  CREATININE 6.60* 6.30* 7.59*  CALCIUM 9.2  --  8.7    Liver Function Tests:  Recent Labs Lab 08/25/14 1536 08/26/14 0332  AST 29 24  ALT 16 12  ALKPHOS 82 66  BILITOT 0.8 0.8  PROT 6.2 5.4*  ALBUMIN 3.0* 2.7*    Recent Labs Lab 08/25/14 1536  LIPASE 34   No results for input(s): AMMONIA in the last 168 hours. CBC:  Recent Labs Lab 08/25/14 1536 08/25/14 1549 08/26/14 0332 08/27/14 0425  WBC 8.1  --  6.8 4.3  NEUTROABS 5.7  --   --   --   HGB 11.9* 12.9* 10.0* 9.2*  HCT 37.7* 38.0* 31.9* 29.2*  MCV 97.2  --  95.8 96.4  PLT 87*  --  79* 71*   Cardiac Enzymes: No results for input(s): CKTOTAL, CKMB, CKMBINDEX, TROPONINI in the last 168 hours. BNP: Invalid input(s): POCBNP CBG:  Recent Labs Lab 08/26/14 1717 08/26/14 2117 08/27/14 0643 08/27/14 0711 08/27/14 1214  GLUCAP 70 170* 60* 75 70    Recent Results (from the past 240 hour(s))  Culture, blood (routine x 2)     Status: None (Preliminary result)   Collection Time: 08/25/14  3:44 PM  Result Value Ref Range Status   Specimen Description BLOOD LEFT ARM  Final   Special Requests BOTTLES DRAWN AEROBIC AND ANAEROBIC 10CC  Final   Culture   Final           BLOOD CULTURE RECEIVED NO GROWTH TO DATE CULTURE WILL BE HELD FOR 5 DAYS BEFORE ISSUING A FINAL NEGATIVE REPORT Performed at [facility]    Report Status PENDING  Incomplete  Culture, blood (routine x 2)     Status: None (Preliminary result)   Collection Time: 08/25/14  5:00 PM  Result Value Ref Range Status   Specimen Description BLOOD WRIST LEFT  Final   Special Requests BOTTLES DRAWN AEROBIC ONLY [place]  Final   Culture   Final           BLOOD CULTURE RECEIVED NO GROWTH TO DATE CULTURE WILL BE HELD FOR 5 DAYS BEFORE ISSUING A FINAL NEGATIVE REPORT Performed at [facility]    Report Status PENDING  Incomplete     Scheduled Meds: . atorvastatin  40 mg Oral Daily  . azaTHIOprine  75 mg Oral q morning - 10a  . calcium acetate  667 mg Oral TID WC  . carvedilol  6.25 mg Oral QHS   . [START ON 08/29/2014] ceFEPime (MAXIPIME) IV  2 g Intravenous Q M,W,F-HD  . colesevelam  3,750 mg Oral Daily  . [START ON 08/31/2014] darbepoetin (ARANESP) injection - DIALYSIS  100 mcg Intravenous Q Wed-HD  . doxercalciferol  4 mcg Intravenous Q M,W,F-HD  . famotidine  10 mg Oral QHS  . insulin aspart  0-9 Units Subcutaneous TID WC  . insulin glargine  5 Units Subcutaneous q morning - 10a  . isosorbide dinitrate  40 mg Oral TID  . multivitamin  1 tablet Oral QHS  . [START ON 08/28/2014] predniSONE  5 mg Oral Q breakfast  . sirolimus  2 mg Oral Daily  .  sodium chloride  3 mL Intravenous Q12H  . sodium chloride  3 mL Intravenous Q12H  . [START ON 08/29/2014] vancomycin  750 mg Intravenous Q M,W,F-HD  . [START ON 08/28/2014] warfarin  5 mg Oral q1800  . warfarin  7.5 mg Oral ONCE-1800  . Warfarin - Pharmacist Dosing Inpatient   Does not apply q1800   Continuous Infusions: . heparin 1,200 Units/hr (08/26/14 1747)     Jonh Mcqueary, DO  [facility] Pager [phone]  If 7PM-7AM, please contact night-coverage [email or web address] Password [email or web address] 08/27/2014, 3:28 PM   LOS: 2 days

## 2014-08-27 NOTE — Progress Notes (Addendum)
ANTICOAGULATION CONSULT NOTE - Follow Up Consult  Pharmacy Consult for Heparin/Coumadin Indication: DVT  Allergies  Allergen Reactions  . Lisinopril Swelling    Lips and tongue swell  . Niacin And Related Other (See Comments)    unknown  . Norvasc [Amlodipine Besylate] Rash    Flushing  . Penicillins Rash    Patient Measurements: Height: 6' (182.9 cm) Weight: 167 lb 12.3 oz (76.1 kg) IBW/kg (Calculated) : 77.6 Heparin Dosing Weight: 72 kg  Vital Signs: Temp: 98 F (36.7 C) (04/02 0600) Temp Source: Oral (04/02 0600) BP: 120/46 mmHg (04/02 0600) Pulse Rate: 76 (04/02 0600)  Labs:  Recent Labs  08/25/14 1536 08/25/14 1549 08/26/14 0100 08/26/14 0332 08/26/14 0543 08/26/14 1434 08/27/14 0425  HGB 11.9* 12.9*  --  10.0*  --   --  9.2*  HCT 37.7* 38.0*  --  31.9*  --   --  29.2*  PLT 87*  --   --  79*  --   --  71*  APTT  --   --  101*  --   --   --   --   LABPROT  --   --  26.7*  --   --   --   --   INR  --   --  2.44*  --   --   --   --   HEPARINUNFRC  --   --   --   --  0.54 0.64 0.55  CREATININE 6.60* 6.30*  --  7.59*  --   --   --     Estimated Creatinine Clearance: 9.9 mL/min (by C-G formula based on Cr of 7.59).   Assessment: 74 YOM with a history of DVTs on Coumadin PTA (INR 2.44 on admit), presented to the ED with left arm pain and swelling. U/S showed L subclavian DVT and nearly occluded RIJ. Coumadin failure?  Anticoagulation: h/o DVTs (2012, 2015). INR 2.44 4/1 overnight. IV heparin level 0.55 remains in goal range. INR not done this AM. Heme suggests increasing INR goal to 2.5-3 with addition of antiplatelets to prevent occulusion of BUE stents.. - Coumadin PTA 5mg  MWFSS, 2.5mg  TTh  Infectious Disease: Vanco/Cefepime #2 for LUE cellulitis. Tmax 99. WBC 4.3 down. ESRD. 3/31: BC x 2 pending. Clinically appears improved. Vanco 4/1>> Cefepime 4/1>>  Cardiovascular: CAD, HTN, CABG, h/o HEART TRANSPLANT. BP 91/45-168/98.  -Meds: Lipitor40, Coreg,  Welchol, Isordil,  -Transplant meds: Azathioprine 5mg /d, Prednisone 5mg /d, Rapamune 2mg /d  Endocrinology: DM with CBGs 60-170 on SSI, Lantus  Gastrointestinal / Nutrition: pepcid  Neurology  Nephrology: h/o failed renal transplant with ESRD on Phoslo, Hectorol, Renavite,   Pulmonary  Hematology / Oncology: Anemia of chronic dz on Aranesp/Wed  PTA Medication Issues: none  Best Practices   Goal of Therapy:  Heparin level 0.3-0.7 units/ml  INR 2.5-3 Monitor platelets by anticoagulation protocol: Yes   Plan:  Heparin continue 1200 units/hr. Heparin level daily.  Vanco 750mg  IV MWF Coumadin 5mg  daily Cefepime 2g IV MWF  Adden: INR 2.29 this AM up stat check. Will increase Coumadin dose to target higher goal 2.5-3.   Lucio Litsey S. Alford Highland, PharmD, BCPS Clinical Staff Pharmacist Pager [phone]  Eilene Ghazi Stillinger 08/27/2014,11:33 AM

## 2014-08-27 NOTE — Progress Notes (Signed)
Hypoglycemic Event  CBG: 60  Treatment: 15 GM carbohydrate snack  Symptoms: None  Follow-up CBG: Time: 0711 CBG Result: 75  Possible Reasons for Event: Unknown  Comments/MD notified: n/a    Ethelda Chick G  Remember to initiate Hypoglycemia Order Set & complete

## 2014-08-27 NOTE — Care Management Note (Signed)
CARE MANAGEMENT NOTE 08/27/2014  Patient:  Brandon Sharp   Account Number:  [id number]  Date Initiated:  08/27/2014  Documentation initiated by:  Oliveras-Aizpurua,Jerilynn Feldmeier  Subjective/Objective Assessment:     Action/Plan:   Anticipated DC Date:     Anticipated DC Plan:           Choice offered to / List presented to:             Status of service:   Medicare Important Message given?   (If response is "NO", the following Medicare IM given date fields will be blank) Date Medicare IM given:   Medicare IM given by:   Date Additional Medicare IM given:  08/27/2014 Additional Medicare IM given by:  Norina Buzzard  Discharge Disposition:    Per UR Regulation:    If discussed at Long Length of Stay Meetings, dates discussed:    Comments:

## 2014-08-28 DIAGNOSIS — I82622 Acute embolism and thrombosis of deep veins of left upper extremity: Secondary | ICD-10-CM

## 2014-08-28 LAB — CBC
HCT: 29.6 % — ABNORMAL LOW (ref 39.0–52.0)
Hemoglobin: 9.3 g/dL — ABNORMAL LOW (ref 13.0–17.0)
MCH: 30.4 pg (ref 26.0–34.0)
MCHC: 31.4 g/dL (ref 30.0–36.0)
MCV: 96.7 fL (ref 78.0–100.0)
PLATELETS: 91 10*3/uL — AB (ref 150–400)
RBC: 3.06 MIL/uL — ABNORMAL LOW (ref 4.22–5.81)
RDW: 16.7 % — ABNORMAL HIGH (ref 11.5–15.5)
WBC: 4.2 10*3/uL (ref 4.0–10.5)

## 2014-08-28 LAB — BASIC METABOLIC PANEL
Anion gap: 11 (ref 5–15)
BUN: 30 mg/dL — AB (ref 6–23)
CALCIUM: 8.6 mg/dL (ref 8.4–10.5)
CO2: 26 mmol/L (ref 19–32)
Chloride: 97 mmol/L (ref 96–112)
Creatinine, Ser: 6.95 mg/dL — ABNORMAL HIGH (ref 0.50–1.35)
GFR calc Af Amer: 8 mL/min — ABNORMAL LOW (ref >90)
GFR calc non Af Amer: 7 mL/min — ABNORMAL LOW (ref >90)
GLUCOSE: 87 mg/dL (ref 70–99)
POTASSIUM: 3.3 mmol/L — AB (ref 3.5–5.1)
Sodium: 134 mmol/L — ABNORMAL LOW (ref 135–145)

## 2014-08-28 LAB — PROTIME-INR
INR: 2.47 — ABNORMAL HIGH (ref 0.00–1.49)
Prothrombin Time: 27 seconds — ABNORMAL HIGH (ref 11.6–15.2)

## 2014-08-28 LAB — GLUCOSE, CAPILLARY
GLUCOSE-CAPILLARY: 120 mg/dL — AB (ref 70–99)
Glucose-Capillary: 88 mg/dL (ref 70–99)
Glucose-Capillary: 109 mg/dL — ABNORMAL HIGH (ref 70–99)
Glucose-Capillary: 161 mg/dL — ABNORMAL HIGH (ref 70–99)

## 2014-08-28 LAB — CARDIOLIPIN ANTIBODIES, IGG, IGM, IGA
Anticardiolipin IgA: <9 APL U/mL (ref 0–11)
Anticardiolipin IgM: <9 MPL U/mL (ref 0–12)

## 2014-08-28 LAB — PROTEIN C, TOTAL: Protein C, Total: 42 % — ABNORMAL LOW (ref 70–140)

## 2014-08-28 LAB — HEPARIN LEVEL (UNFRACTIONATED): Heparin Unfractionated: 0.53 IU/mL (ref 0.30–0.70)

## 2014-08-28 NOTE — Progress Notes (Signed)
ANTICOAGULATION CONSULT NOTE - Follow Up Consult  Pharmacy Consult for Heparin/Coumadin and Vanco Indication: DVT + LUE cellulitis  Allergies  Allergen Reactions  . Lisinopril Swelling    Lips and tongue swell  . Niacin And Related Other (See Comments)    unknown  . Norvasc [Amlodipine Besylate] Rash    Flushing  . Penicillins Rash    Patient Measurements: Height: 6' (182.9 cm) Weight: 168 lb 3.4 oz (76.3 kg) IBW/kg (Calculated) : 77.6 Heparin Dosing Weight: 72 kg  Vital Signs: Temp: 98.4 F (36.9 C) (04/03 0621) Temp Source: Oral (04/03 0621) BP: 131/63 mmHg (04/03 0621) Pulse Rate: 86 (04/03 0621)  Labs:  Recent Labs  08/25/14 1536 08/25/14 1549 08/26/14 0100 08/26/14 0332  08/26/14 1434 08/27/14 0425 08/27/14 1214 08/28/14 0700  HGB 11.9* 12.9*  --  10.0*  --   --  9.2*  --   --   HCT 37.7* 38.0*  --  31.9*  --   --  29.2*  --   --   PLT 87*  --   --  79*  --   --  71*  --   --   APTT  --   --  101*  --   --   --   --   --   --   LABPROT  --   --  26.7*  --   --   --   --  25.4* 27.0*  INR  --   --  2.44*  --   --   --   --  2.29* 2.47*  HEPARINUNFRC  --   --   --   --   < > 0.64 0.55  --  0.53  CREATININE 6.60* 6.30*  --  7.59*  --   --   --   --  6.95*  < > = values in this interval not displayed.  Estimated Creatinine Clearance: 10.8 mL/min (by C-G formula based on Cr of 6.95).   Assessment:  85 YOM with a history of DVTs on Coumadin PTA (INR 2.44 on admit), presented to the ED with left arm pain and swelling. U/S showed L subclavian DVT and nearly occluded RIJ. Coumadin failure?  Anticoagulation: h/o DVTs (2012, 2015). New LUA and r IJ DVT. INR 2.44 on 4/1 overnight. IV heparin level 0.53 remains in goal range. INR 2.47. Heme suggests increasing INR goal to 2.5-3 with addition of antiplatelets to prevent occulusion of BUE stents. Continue IV heparin until INR>2.5. CBC note done today. Plts 71 yesterday. - Coumadin PTA 5mg  MWFSS, 2.5mg  TTh  Infectious  Disease: Vanco/Cefepime #3 for LUE cellulitis. Immunosuppressed. Afebr. WBC 4.3 down. ESRD. 3/31: BC x 2 pending. Clinically appears improved. Vanco 4/1>> Cefepime 4/1>>  Cardiovascular: CAD, HTN, CABG, h/o HEART TRANSPLANT.VSS -Meds: Lipitor40, Coreg, Welchol, Isordil,  -Transplant meds: Azathioprine 5mg /d, Prednisone 5mg /d, Rapamune 2mg /d  Endocrinology: DM with CBGs 60-109 low on SSI, Lantus  Gastrointestinal / Nutrition: pepcid  Nephrology: h/o failed renal transplant with ESRD on Phoslo, Hectorol, Renavite,   Hematology / Oncology: Anemia of chronic dz on Aranesp/Wed  PTA Medication Issues: none . Goal of Therapy:  Heparin level 0.3-0.7 units/ml  INR 2.5-3 Monitor platelets by anticoagulation protocol: Yes   Plan:  Heparin continue 1200 units/hr. Heparin level daily. Coumadin 5mg  daily Vanco 750mg  IV MWF Cefepime 2g IV MWF Watch platelets closely   Tajha Sammarco S. Alford Highland, PharmD, BCPS Clinical Staff Pharmacist Pager [phone]  Eilene Ghazi Stillinger 08/28/2014,12:40 PM

## 2014-08-28 NOTE — Progress Notes (Signed)
PROGRESS NOTE  Brandon Sharp QTM:[id number] DOB: [birth year] DOA: 08/25/2014 PCP: [name], MD   Brief History 69 y/o male with hx of ESRD, heart transplant ([facility] 2001) and failed renal transplant, diabetes mellitus, hypertension, CAD status post CABG presents with one-day history of left upper approximately swelling and erythema. The patient had a fever of 100.53F at home. The patient had history of left lower extremity DVT in August 2012. He was treated with warfarin for approximately 3-6 months at that time. The patient had another DVT of the right IJ vein on 05/06/2014. The patient was started back on warfarin. He has been compliant. INR was 2.44 at the time of admission. Venous duplex revealed new left subclavian acute DVT and persistent right IJ DVT.  Assessment/Plan: Bilateral upper extremity DVT -New left subclavian acute DVT -Patient had therapeutic INR upon presentation -This represents the patient's third episode of acute DVT -The patient has left axillary vein stent 4 years and right subclavian stent with in-stent stenosis -Appreciate hematology consult--recommend vascular surg eval and life long anticoagulation with warfarin; consider adding ASA -consulted vascular and spoke with Dr. Gardiner Coins that the patient's vascular disease and multiple vascular appliances are likely contributing to the patient's DVT, but only treatment option is anticoagulation. -Discussed the risks and benefits of adding aspirin including but not limited to bleeding.; patient will think about it. -target INR 2.5-3.0 -Continue heparin drip until INR >2.5 -Monitor for signs of bleeding Cellulitis left upper extremity -Continue empiric vancomycin pending culture data -d/c cefepime -also suspect pt's list including DVT may also be contributing to the patient's symptoms of edema and pain -Ultimate plan to send the patient home with po antibiotics ESRD -appreciate Dr.  Moshe Cipro -saline lock IVF -decrease prednisone per renal CAD with history of CABG -Continue carvedilol, Lipitor, WelChol, Isordil Diabetes mellitus type 2 -Hemoglobin A1c 04/12/2014--7.2 -Decrease Lantus 5 units daily due to mild hypoglycemia--> No further hypoglycemic episodes -NovoLog sliding scale History of heart transplant and failed renal transplant -Continue Imuran, sirolimus, prednisone  -prednisone to 5mg  per renal Thrombocytopenia  -chronic, likely due to immunosuppressives and coumadin -monitor for signs of bleeding Hypokalemia -Replete judiciously  Family Communication: daughter updated at beside Disposition Plan: Home 4/4 if INR >2.5      Procedures/Studies: Dg Chest Port 1 View  08/25/2014   CLINICAL DATA:  Left arm pain.  Fever for 1 day.  EXAM: PORTABLE CHEST - 1 VIEW  COMPARISON:  05/08/2014  FINDINGS: Mild cardiomegaly is stable. Aortic and hilar contours are unchanged. Bilateral central venous stents, new on the right from prior. There is no edema, consolidation, effusion, or pneumothorax.  IMPRESSION: No active disease.   Electronically Signed   By: Monte Fantasia M.D.   On: 08/25/2014 15:55         Subjective:  patient states that his right arm swelling and pain have improved. Denies any fevers, chills, chest pain, shortness breath, nausea, vomiting, diarrhea, abdominal pain.  Objective: Filed Vitals:   08/27/14 1409 08/27/14 2129 08/28/14 0621 08/28/14 1418  BP: 130/69 131/65 131/63 125/48  Pulse: 80 88 86 80  Temp: 98.5 F (36.9 C) 98.9 F (37.2 C) 98.4 F (36.9 C) 98.2 F (36.8 C)  TempSrc: Oral Oral Oral Oral  Resp: 18 16 18 18   Height:      Weight:   76.3 kg (168 lb 3.4 oz)   SpO2: 98% 98% 99% 97%    Intake/Output Summary (Last 24 hours)  at 08/28/14 1741 Last data filed at 08/28/14 1300  Gross per 24 hour  Intake    360 ml  Output      0 ml  Net    360 ml   Weight change: 2.5 kg (5 lb 8.2 oz) Exam:   General:  Pt is  alert, follows commands appropriately, not in acute distress  HEENT: No icterus, No thrush,  [place]/AT  Cardiovascular: RRR, S1/S2, no rubs, no gallops  Respiratory: CTA bilaterally, no wheezing, no crackles, no rhonchi  Abdomen: Soft/+BS, non tender, non distended, no guarding  Extremities: bilateral UE edema, No lymphangitis, No petechiae, No rashes, no synovitis  Data Reviewed: Basic Metabolic Panel:  Recent Labs Lab 08/25/14 1536 08/25/14 1549 08/26/14 0332 08/28/14 0700  NA 139 137 138 134*  K 3.6 3.5 4.0 3.3*  CL 96 97 98 97  CO2 28  --  28 26  GLUCOSE 126* 128* 102* 87  BUN 31* 32* 39* 30*  CREATININE 6.60* 6.30* 7.59* 6.95*  CALCIUM 9.2  --  8.7 8.6   Liver Function Tests:  Recent Labs Lab 08/25/14 1536 08/26/14 0332  AST 29 24  ALT 16 12  ALKPHOS 82 66  BILITOT 0.8 0.8  PROT 6.2 5.4*  ALBUMIN 3.0* 2.7*    Recent Labs Lab 08/25/14 1536  LIPASE 34   No results for input(s): AMMONIA in the last 168 hours. CBC:  Recent Labs Lab 08/25/14 1536 08/25/14 1549 08/26/14 0332 08/27/14 0425 08/28/14 0700  WBC 8.1  --  6.8 4.3 4.2  NEUTROABS 5.7  --   --   --   --   HGB 11.9* 12.9* 10.0* 9.2* 9.3*  HCT 37.7* 38.0* 31.9* 29.2* 29.6*  MCV 97.2  --  95.8 96.4 96.7  PLT 87*  --  79* 71* 91*   Cardiac Enzymes: No results for input(s): CKTOTAL, CKMB, CKMBINDEX, TROPONINI in the last 168 hours. BNP: Invalid input(s): POCBNP CBG:  Recent Labs Lab 08/27/14 1658 08/27/14 2127 08/28/14 0619 08/28/14 1206 08/28/14 1710  GLUCAP 94 89 88 109* 120*    Recent Results (from the past 240 hour(s))  Culture, blood (routine x 2)     Status: None (Preliminary result)   Collection Time: 08/25/14  3:44 PM  Result Value Ref Range Status   Specimen Description BLOOD LEFT ARM  Final   Special Requests BOTTLES DRAWN AEROBIC AND ANAEROBIC 10CC  Final   Culture   Final           BLOOD CULTURE RECEIVED NO GROWTH TO DATE CULTURE WILL BE HELD FOR 5 DAYS BEFORE ISSUING  A FINAL NEGATIVE REPORT Performed at [facility]    Report Status PENDING  Incomplete  Culture, blood (routine x 2)     Status: None (Preliminary result)   Collection Time: 08/25/14  5:00 PM  Result Value Ref Range Status   Specimen Description BLOOD WRIST LEFT  Final   Special Requests BOTTLES DRAWN AEROBIC ONLY [id number]  Final   Culture   Final           BLOOD CULTURE RECEIVED NO GROWTH TO DATE CULTURE WILL BE HELD FOR 5 DAYS BEFORE ISSUING A FINAL NEGATIVE REPORT Performed at [facility]    Report Status PENDING  Incomplete     Scheduled Meds: . atorvastatin  40 mg Oral Daily  . azaTHIOprine  75 mg Oral q morning - 10a  . calcium acetate  667 mg Oral TID WC  . carvedilol  6.25  mg Oral QHS  . colesevelam  3,750 mg Oral Daily  . [START ON 08/31/2014] darbepoetin (ARANESP) injection - DIALYSIS  100 mcg Intravenous Q Wed-HD  . doxercalciferol  4 mcg Intravenous Q M,W,F-HD  . famotidine  10 mg Oral QHS  . insulin aspart  0-9 Units Subcutaneous TID WC  . insulin glargine  5 Units Subcutaneous q morning - 10a  . isosorbide dinitrate  40 mg Oral TID  . multivitamin  1 tablet Oral QHS  . predniSONE  5 mg Oral Q breakfast  . sirolimus  2 mg Oral Daily  . sodium chloride  3 mL Intravenous Q12H  . sodium chloride  3 mL Intravenous Q12H  . [START ON 08/29/2014] vancomycin  750 mg Intravenous Q M,W,F-HD  . warfarin  5 mg Oral q1800  . Warfarin - Pharmacist Dosing Inpatient   Does not apply q1800   Continuous Infusions: . heparin 1,200 Units/hr (08/27/14 1856)     Miaisabella Bacorn, DO  [facility] Pager [phone]  If 7PM-7AM, please contact night-coverage [email or web address] Password [email or web address] 08/28/2014, 5:41 PM   LOS: 3 days

## 2014-08-28 NOTE — Consult Note (Signed)
Patient name: Brandon Sharp MRN: [id number] DOB: 1946-01-26 Sex: male   Referred by: Tat  Reason for referral:  Chief Complaint  Patient presents with  . Arm Pain    cellulitis  . Abdominal Pain    HISTORY OF PRESENT ILLNESS: Patient is a very pleasant 69 year old gentleman with a long history of end-stage renal disease. He has had recurrent episodes of bilateral upper extremity venous issues. We are consult to determine if other treatment options are available. He is a very complex history. He is status post heart transplant in the past also has kidney failure. Most of his treatment prior to 2012 2013 was elsewhere. He does have a right subclavian and innominate stent. Does have a right upper arm AV fistula that is functioning quite nicely. 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 Most recently in December 2015, Dr. Bridgett Larsson did a re-intervention angioplasty of the stent for in-stent restenosis. He does have good function of his right arm fistula. He was admitted with right IJ DVT in the past. In looking at his records, I do not see any reference to treatment of his old left arm. This was done at another institution. On his most recent CAT scan of his chest looking for other issues in January 2016, he does have a stent in his axillary vein on the left and with his arms elevated for the CT scan of this appears to be nearly occluded. He presented with left arm swelling in the ultrasound showed a DVT and he was admitted for anticoagulation.  Past Medical History  Diagnosis Date  . Diabetes mellitus   . Hypertension   . Myocardial infarction 1985; 1990  . Angina   . Coronary artery disease   . Dysrhythmia   . DVT (deep venous thrombosis) ~ 12/2010    LLE  . Anemia   . Blood transfusion 06/1999    post heart transplant  . Peripheral vascular disease   . CHF  (congestive heart failure) 2001    beffore transplant  . Renal failure     Hemodialysis MWF last 2 years, sse dr [name] nephrology, goes to [facility]  . DVT (deep venous thrombosis) 08/25/2014    Nearly occluded R IJ, and L subclavian DVT    Past Surgical History  Procedure Laterality Date  . Nephrectomy transplanted organ  2008  . Av fistula repair      rt. a=fore arm fistula  . Av fistula placement  ~ 01/2010    "this was my 2nd fistula placement"  . Heart transplant  06/1999  . Coronary angioplasty with stent placement  1985; 1990  . Colonoscopy  03/17/2012    Procedure: COLONOSCOPY;  Surgeon: Lear Ng, MD;  Location: [facility];  Service: Endoscopy;  Laterality: N/A;  . Coronary artery bypass graft  1990    CABG X3  . Colonoscopy with propofol N/A 12/14/2013    Procedure: COLONOSCOPY WITH PROPOFOL;  Surgeon: Lear Ng, MD;  Location: [facility];  Service: Endoscopy;  Laterality: N/A;  . Video bronchoscopy Bilateral 04/14/2014    Procedure: VIDEO BRONCHOSCOPY WITH FLUORO;  Surgeon: Collene Gobble, MD;  Location: [facility];  Service: Cardiopulmonary;  Laterality: Bilateral;  . Shuntogram Right 05/09/2014    Procedure: FISTULOGRAM;  Surgeon: Conrad Wilmore, MD;  Location: [facility] CATH LAB;  Service: Cardiovascular;  Laterality: Right;  History   Social History  . Marital Status: Married    Spouse Name: N/A  . Number of Children: N/A  . Years of Education: N/A   Occupational History  . Not on file.   Social History Main Topics  . Smoking status: Former Smoker -- 1.00 packs/day for 20 years    Types: Cigarettes    Quit date: 05/27/1988  . Smokeless tobacco: Never Used  . Alcohol Use: Yes     Comment: "Holidays"  . Drug Use: No  . Sexual Activity: Not on file   Other Topics Concern  . Not on file   Social History Narrative    Family History  Problem Relation Age of Onset  . Heart disease Mother   . Hyperlipidemia Mother    . Hypertension Mother   . Hyperlipidemia Father   . Hypertension Father   . Diabetes Brother   . Heart disease Brother     Heart Disease before age 47  . Hyperlipidemia Brother   . Hypertension Brother     Allergies as of 08/25/2014 - Review Complete 08/25/2014  Allergen Reaction Noted  . Lisinopril Swelling 09/27/2010  . Niacin and related Other (See Comments) 04/11/2014  . Norvasc [amlodipine besylate] Rash 09/27/2010  . Penicillins Rash 09/27/2010    No current facility-administered medications on file prior to encounter.   Current Outpatient Prescriptions on File Prior to Encounter  Medication Sig Dispense Refill  . atorvastatin (LIPITOR) 40 MG tablet Take 40 mg by mouth daily.      Marland Kitchen azaTHIOprine (IMURAN) 50 MG tablet Take 75 mg by mouth every morning.     Marland Kitchen b complex-vitamin c-folic acid (NEPHRO-VITE) 0.8 MG TABS tablet Take 1 tablet by mouth at bedtime.    . calcium acetate (PHOSLO) 667 MG capsule Take 667 mg by mouth 3 (three) times daily with meals.     . carvedilol (COREG) 6.25 MG tablet Take 1 tablet (6.25 mg total) by mouth at bedtime. 30 tablet 0  . colesevelam (WELCHOL) 625 MG tablet Take 3,750 mg by mouth daily. Takes 6 tabs daily    . famotidine (PEPCID AC) 10 MG chewable tablet Chew 10 mg by mouth at bedtime.     Marland Kitchen HUMALOG KWIKPEN 100 UNIT/ML SOPN Inject 6-10 Units into the skin 2 (two) times daily. Per sliding scale    . insulin glargine (LANTUS) 100 UNIT/ML injection Inject 20 Units into the skin every morning.    . isosorbide dinitrate (ISORDIL) 40 MG tablet Take 40 mg by mouth 3 (three) times daily.      . predniSONE (DELTASONE) 10 MG tablet Take 1 tablet (10 mg total) by mouth daily with breakfast. 30 tablet 0  . sirolimus (RAPAMUNE) 2 MG tablet Take 2 mg by mouth daily.    Marland Kitchen warfarin (COUMADIN) 5 MG tablet Take 1 tablet (5 mg total) by mouth daily. (Patient taking differently: Take 5 mg by mouth daily. Take 5mg  mon wed Friday Saturday and Sunday. Rest of  days take 2.5mg  tab) 30 tablet 0  . enoxaparin (LOVENOX) 40 MG/0.4ML injection Inject 0.7 mLs (70 mg total) into the skin daily. (Patient not taking: Reported on 06/21/2014) 6 Syringe 0  . Nutritional Supplements (FEEDING SUPPLEMENT, NEPRO CARB STEADY,) LIQD Take 237 mLs by mouth as needed (missed meal during dialysis.). (Patient not taking: Reported on 06/21/2014) 30 Can 0  . [DISCONTINUED] doxazosin (CARDURA) 8 MG tablet Take 8 mg by mouth 2 (two) times daily.      . [DISCONTINUED] hydrALAZINE (  APRESOLINE) 25 MG tablet Take 75 mg by mouth 3 (three) times daily.         REVIEW OF SYSTEMS:  Reviewed in his chart with nothing to add  PHYSICAL EXAMINATION:  General: The patient is a well-nourished male, in no acute distress. Vital signs are BP 131/63 mmHg  Pulse 86  Temp(Src) 98.4 F (36.9 C) (Oral)  Resp 18  Ht 6' (1.829 m)  Wt 168 lb 3.4 oz (76.3 kg)  BMI 22.81 kg/m2  SpO2 99% Pulmonary: There is a good air exchange   Musculoskeletal: There are no major deformities.  Neurologic: No focal weakness or paresthesias are detected, Skin: There are no ulcer or rashes noted. Psychiatric: The patient has normal affect. Cardiovascular: 2+ radial pulses bilaterally, patent right upper arm fistula with good thrill Bilateral arm swelling with no evidence of skin breakdown or phlegmasia     Impression and Plan:  Discussed the situation at length with the patient this morning. He has a clear reasons for his DVT bilaterally. On the right the stent does go across his innominate vein and therefore impinges on the outflow of his right internal jugular. Only treatment option is anticoagulation. On the left he has a long-standing left axillary vein stent which is been present probably for at least 4 years. I suspect this is occluded. Explain the only treatment option here would be anticoagulation as he is being done or if he had evidence of threatened arm viability would consider thromboliasis. He  does have swelling but no evidence of threatened arm viability. Explained that even with a very aggressive treatment he has an extremely high risk of recurrent occlusion due to presence of his axillary vein stent.  Agree with current treatment with anticoagulation. Follow actively. Please call if we can assist.    EARLY, TODD [facility] Office: [phone]

## 2014-08-28 NOTE — Progress Notes (Signed)
Subjective:    Stable overnight- still ready to go home- apparently they wanted VVS input and per the patient he does not wish to do anything differently  Objective Vital signs in last 24 hours: Filed Vitals:   08/27/14 0600 08/27/14 1409 08/27/14 2129 08/28/14 0621  BP: 120/46 130/69 131/65 131/63  Pulse: 76 80 88 86  Temp: 98 F (36.7 C) 98.5 F (36.9 C) 98.9 F (37.2 C) 98.4 F (36.9 C)  TempSrc: Oral Oral Oral Oral  Resp: 16 18 16 18   Height:      Weight: 76.1 kg (167 lb 12.3 oz)   76.3 kg (168 lb 3.4 oz)  SpO2: 100% 98% 98% 99%   Weight change: 2.5 kg (5 lb 8.2 oz)  Intake/Output Summary (Last 24 hours) at 08/28/14 0951 Last data filed at 08/28/14 0800  Gross per 24 hour  Intake    480 ml  Output      0 ml  Net    480 ml    Dialysis Orders: MWF NW 4hr 72kgs 2K/2.25Ca 400/1.5 R AVF 3000u heparin Aranesp 100 q weds 4 hectorol   Assessment/Plan: 1. LUA and r IJ DVT- history of DVT and on coumadin. INR was therapeutic. hypercoag panel pending. Heparin gtt. 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 Cellulitis- vanc and cefepime. Blood cultures negative. Looks better clinically-Afebrile- immune supression makes him more susceptible to infection 3. ESRD - MWF via AVF NW, HD next Monday K+4- will write orders for here but if goes home will also let his kidney center know to expect him tomorrow 4. Hypertension/volume - 113/42. On carvedilol 6.25. Getting to EDW. uf goal usually around 3L 5. Anemia - hgb 10, cont Aranesp q wends. No Fe 6. Metabolic bone disease - Ca+ 8.7/ last phos 5.1 and PTH 489 3/23- cont hectorol/phoslo 7. Nutrition - renal diet. Vitamin. alb 2.7 8. DM- per primary 9. S/p heart transplant- on imuran and sirolimus- prednisone was actually added at the time of renal tranplant but has stayed on- I am going to take down to 7.5 (he wanted to  take it down to 5 and will tell us if he has ill effects from this)  and will continue slow wean as OP  10. Dispo- renal is OK with discharge- per primary team- we can give IV abx with HD if you think are needed- call Shelton Silvas at [phone] to arrange over the weekend - otherwise could change to something PO      Samiel Peel A    Labs: Basic Metabolic Panel:  Recent Labs Lab 08/25/14 1536 08/25/14 1549 08/26/14 0332 08/28/14 0700  NA 139 137 138 134*  K 3.6 3.5 4.0 3.3*  CL 96 97 98 97  CO2 28  --  28 26  GLUCOSE 126* 128* 102* 87  BUN 31* 32* 39* 30*  CREATININE 6.60* 6.30* 7.59* 6.95*  CALCIUM 9.2  --  8.7 8.6   Liver Function Tests:  Recent Labs Lab 08/25/14 1536 08/26/14 0332  AST 29 24  ALT 16 12  ALKPHOS 82 66  BILITOT 0.8 0.8  PROT 6.2 5.4*  ALBUMIN 3.0* 2.7*    Recent Labs Lab 08/25/14 1536  LIPASE 34   No results for input(s): AMMONIA in the last 168 hours. CBC:  Recent Labs Lab 08/25/14 1536 08/25/14 1549 08/26/14 0332 08/27/14 0425  WBC 8.1  --  6.8 4.3  NEUTROABS 5.7  --   --   --  HGB 11.9* 12.9* 10.0* 9.2*  HCT 37.7* 38.0* 31.9* 29.2*  MCV 97.2  --  95.8 96.4  PLT 87*  --  79* 71*   Cardiac Enzymes: No results for input(s): CKTOTAL, CKMB, CKMBINDEX, TROPONINI in the last 168 hours. CBG:  Recent Labs Lab 08/27/14 0711 08/27/14 1214 08/27/14 1658 08/27/14 2127 08/28/14 0619  GLUCAP 75 70 94 89 88    Iron Studies: No results for input(s): IRON, TIBC, TRANSFERRIN, FERRITIN in the last 72 hours. Studies/Results: No results found. Medications: Infusions: . heparin 1,200 Units/hr (08/27/14 1856)    Scheduled Medications: . atorvastatin  40 mg Oral Daily  . azaTHIOprine  75 mg Oral q morning - 10a  . calcium acetate  667 mg Oral TID WC  . carvedilol  6.25 mg Oral QHS  . colesevelam  3,750 mg Oral Daily  . [START ON 08/31/2014] darbepoetin (ARANESP) injection - DIALYSIS  100 mcg Intravenous Q Wed-HD  . doxercalciferol   4 mcg Intravenous Q M,W,F-HD  . famotidine  10 mg Oral QHS  . insulin aspart  0-9 Units Subcutaneous TID WC  . insulin glargine  5 Units Subcutaneous q morning - 10a  . isosorbide dinitrate  40 mg Oral TID  . multivitamin  1 tablet Oral QHS  . predniSONE  5 mg Oral Q breakfast  . sirolimus  2 mg Oral Daily  . sodium chloride  3 mL Intravenous Q12H  . sodium chloride  3 mL Intravenous Q12H  . [START ON 08/29/2014] vancomycin  750 mg Intravenous Q M,W,F-HD  . warfarin  5 mg Oral q1800  . Warfarin - Pharmacist Dosing Inpatient   Does not apply q1800    have reviewed scheduled and prn medications.  Physical Exam: General: NAD Heart: RRR Lungs: mostly clear Abdomen: soft, non tender Extremities: no peripheral edema other than left arm- but looks clinically improved Dialysis Access: right upper arm AVF     08/28/2014,9:51 AM  LOS: 3 days

## 2014-08-29 LAB — BASIC METABOLIC PANEL
Anion gap: 13 (ref 5–15)
BUN: 44 mg/dL — ABNORMAL HIGH (ref 6–23)
CALCIUM: 8.8 mg/dL (ref 8.4–10.5)
CO2: 22 mmol/L (ref 19–32)
CREATININE: 8.81 mg/dL — AB (ref 0.50–1.35)
Chloride: 101 mmol/L (ref 96–112)
GFR calc Af Amer: 6 mL/min — ABNORMAL LOW (ref >90)
GFR calc non Af Amer: 5 mL/min — ABNORMAL LOW (ref >90)
GLUCOSE: 92 mg/dL (ref 70–99)
Potassium: 3.9 mmol/L (ref 3.5–5.1)
SODIUM: 136 mmol/L (ref 135–145)

## 2014-08-29 LAB — FACTOR 5 LEIDEN

## 2014-08-29 LAB — CBC
HCT: 29.1 % — ABNORMAL LOW (ref 39.0–52.0)
Hemoglobin: 9 g/dL — ABNORMAL LOW (ref 13.0–17.0)
MCH: 29.8 pg (ref 26.0–34.0)
MCHC: 30.9 g/dL (ref 30.0–36.0)
MCV: 96.4 fL (ref 78.0–100.0)
PLATELETS: 92 10*3/uL — AB (ref 150–400)
RBC: 3.02 MIL/uL — ABNORMAL LOW (ref 4.22–5.81)
RDW: 16.3 % — ABNORMAL HIGH (ref 11.5–15.5)
WBC: 3.1 10*3/uL — AB (ref 4.0–10.5)

## 2014-08-29 LAB — BETA-2-GLYCOPROTEIN I ABS, IGG/M/A
Beta-2-Glycoprotein I IgA: <9 GPI IgA units (ref 0–25)
Beta-2-Glycoprotein I IgM: <9 GPI IgM units (ref 0–32)

## 2014-08-29 LAB — HEMOGLOBIN A1C
Hgb A1c MFr Bld: 6.4 % — ABNORMAL HIGH (ref 4.8–5.6)
Mean Plasma Glucose: 137 mg/dL

## 2014-08-29 LAB — PROTIME-INR
INR: 2.83 — ABNORMAL HIGH (ref 0.00–1.49)
PROTHROMBIN TIME: 30 s — AB (ref 11.6–15.2)

## 2014-08-29 LAB — HEPARIN LEVEL (UNFRACTIONATED): Heparin Unfractionated: 0.8 IU/mL — ABNORMAL HIGH (ref 0.30–0.70)

## 2014-08-29 LAB — GLUCOSE, CAPILLARY
GLUCOSE-CAPILLARY: 108 mg/dL — AB (ref 70–99)
Glucose-Capillary: 93 mg/dL (ref 70–99)

## 2014-08-29 MED ORDER — WARFARIN SODIUM 5 MG PO TABS: 5.0000 mg | ORAL_TABLET | Freq: Every day | ORAL | Status: DC

## 2014-08-29 MED ORDER — PREDNISONE 5 MG PO TABS: 5.0000 mg | ORAL_TABLET | Freq: Every day | ORAL | Status: AC

## 2014-08-29 MED ORDER — DOXYCYCLINE HYCLATE 100 MG PO TABS
100.0000 mg | ORAL_TABLET | Freq: Two times a day (BID) | ORAL | Status: DC
Start: 2014-08-29 — End: 2014-09-29

## 2014-08-29 MED ORDER — DOXYCYCLINE HYCLATE 100 MG PO TABS
100.0000 mg | ORAL_TABLET | Freq: Two times a day (BID) | ORAL | Status: DC
  Administered 2014-08-29: 100 mg via ORAL
  Filled 2014-08-29: qty 1

## 2014-08-29 MED ORDER — DOXERCALCIFEROL 4 MCG/2ML IV SOLN
INTRAVENOUS | Status: AC
  Administered 2014-08-29: 4 ug via INTRAVENOUS
  Filled 2014-08-29: qty 2

## 2014-08-29 MED ORDER — HEPARIN SODIUM (PORCINE) 1000 UNIT/ML DIALYSIS: 20.0000 [IU]/kg | INTRAMUSCULAR | Status: DC | PRN

## 2014-08-29 NOTE — Discharge Summary (Signed)
Physician Discharge Summary  Brandon Sharp ALP:[id number] DOB: 1945-09-21 DOA: 08/25/2014  PCP: Brandon Noon, MD  Admit date: 08/25/2014 Discharge date: 08/29/2014  Recommendations for Outpatient Follow-up:  1. Pt will need to follow up with PCP in 2 weeks post discharge 2. Please obtain CBC on 08/31/14 3. 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 -Discussed the risks and benefits of adding aspirin including but not limited to bleeding.; patient will think about it. -target INR 2.5-3.0 per hematology -Continue heparin drip until INR >2.5 -Monitor for signs of bleeding -INR 2.83 on the day of discharge -Pt with go home with new dose of Coumadin--5mg  po q day Cellulitis left upper extremity -Continue empiric vancomycin pending culture data -d/c cefepime -also suspect pt's list including DVT may also be contributing to the patient's symptoms of edema and pain -Ultimate plan to send the patient home with po antibiotics--doxy 100mg  po bid x 5 days to complete 10 days of therapy ESRD -appreciate Dr. Moshe Cipro -saline lock IVF -decrease prednisoneto 5mg  daily per renal CAD with history of CABG -Continue carvedilol, Lipitor, WelChol, Isordil Diabetes mellitus type 2 -Hemoglobin A1c 04/12/2014--7.2 -Decrease Lantus 5 units daily due  to mild hypoglycemia--> No further hypoglycemic episodes -NovoLog sliding scale -pt can resume his home dose of Lantus after d/c History of heart transplant and failed renal transplant -Continue Imuran, sirolimus, prednisone  -prednisone to 5mg  per renal Thrombocytopenia  -chronic, likely due to immunosuppressives and coumadin -monitor for signs of bleeding -remained stable throughout hospitalization Hypokalemia -Replete judiciously  Discharge Condition: stable  Disposition:  Follow-up Information    Follow up with Brandon Croon, MD In 2 weeks.   Specialty:  Nephrology   Contact information:   [place] [address] [phone]       Follow up with Brandon Noon, MD In 1 week.   Specialty:  Family Medicine   Contact information:   [place] [address] [phone]       Follow up with Brandon Sharp, NI, MD In 2 weeks.   Specialty:  Hematology and Oncology   Contact information:   [place] [address] [phone]     home  Diet: renal with 1200cc fluid restriction Wt Readings from Last 3 Encounters:  08/29/14 72.1 kg (158 lb 15.2 oz)  06/21/14 72.122 kg (159 lb)  05/11/14 69.9 kg (154 lb 1.6 oz)    History of present illness:  69 y/o male with hx of ESRD, heart transplant ([facility] 2001) and failed renal transplant, diabetes mellitus, hypertension, CAD status post CABG presents with one-day history of left upper approximately swelling and erythema. The patient had a fever of 100.22F at home. The patient had history of left lower extremity DVT in August 2012. He was treated with warfarin for approximately 3-6 months at that time. The patient had another DVT of the right IJ vein on 05/06/2014. The patient was started back on warfarin. He has been compliant.  INR was 2.44 at the time of admission. Venous duplex revealed new left subclavian acute DVT and persistent right IJ  DVT.   Consultants: Renal--Brandon Sharp Hematology--Brandon Sharp VVS-Early  Discharge Exam: Filed Vitals:   08/29/14 1108  BP: 133/56  Pulse: 80  Temp: 97.2 F (36.2 C)  Resp: 12   Filed Vitals:   08/29/14 1000 08/29/14 1030 08/29/14 1100 08/29/14 1108  BP: 146/68 129/63 135/61 133/56  Pulse: 77 81 83 80  Temp:    97.2 F (36.2 C)  TempSrc:    Oral  Resp:    12  Height:      Weight:    72.1 kg (158 lb 15.2 oz)  SpO2:    99%   General: A&O x 3, NAD, pleasant, cooperative Cardiovascular: RRR, no rub, no gallop, no S3 Respiratory: CTAB, no wheeze, no rhonchi Abdomen:soft, nontender, nondistended, positive bowel sounds Extremities: 2+ bilateral UE edema, No lymphangitis, no petechiae; radial pulse present bilateral, cap refill < 2 sec  Discharge Instructions      Discharge Instructions    Diet - low sodium heart healthy    Complete by:  As directed      Increase activity slowly    Complete by:  As directed             Medication List    STOP taking these medications        enoxaparin 40 MG/0.4ML injection  Commonly known as:  LOVENOX     feeding supplement (NEPRO CARB STEADY) Liqd      TAKE these medications        atorvastatin 40 MG tablet  Commonly known as:  LIPITOR  Take 40 mg by mouth daily.     azaTHIOprine 50 MG tablet  Commonly known as:  IMURAN  Take 75 mg by mouth every morning.     b complex-vitamin c-folic acid 0.8 MG Tabs tablet  Take 1 tablet by mouth at bedtime.     calcium acetate 667 MG capsule  Commonly known as:  PHOSLO  Take 667 mg by mouth 3 (three) times daily with meals.     carvedilol 6.25 MG tablet  Commonly known as:  COREG  Take 1 tablet (6.25 mg total) by mouth at bedtime.     colesevelam 625 MG tablet  Commonly known as:  WELCHOL  Take 3,750 mg by mouth daily. Takes 6 tabs daily     doxycycline 100 MG tablet  Commonly known as:  VIBRA-TABS  Take 1 tablet (100 mg total) by mouth every 12 (twelve) hours.      famotidine 10 MG chewable tablet  Commonly known as:  PEPCID AC  Chew 10 mg by mouth at bedtime.     HUMALOG KWIKPEN 100 UNIT/ML KiwkPen  Generic drug:  insulin lispro  Inject 6-10 Units into the skin 2 (two) times daily. Per sliding scale     insulin glargine 100 UNIT/ML injection  Commonly known as:  LANTUS  Inject 20 Units into the skin every morning.     isosorbide dinitrate 40 MG tablet  Commonly known as:  ISORDIL  Take 40 mg by mouth 3 (three) times daily.     predniSONE 5 MG tablet  Commonly known as:  DELTASONE  Take 1 tablet (5 mg total) by mouth daily with breakfast.     sirolimus 2 MG tablet  Commonly known as:  RAPAMUNE  Take 2 mg by mouth daily.     warfarin 5 MG tablet  Commonly known as:  COUMADIN  Take 1 tablet (5 mg total) by mouth daily.         The results of significant diagnostics from this hospitalization (including imaging, microbiology, ancillary and laboratory) are listed below for reference.    Significant Diagnostic Studies: Dg Chest Port 1 View  08/25/2014   CLINICAL DATA:  Left arm pain.  Fever for 1 day.  EXAM: PORTABLE CHEST - 1 VIEW  COMPARISON:  05/08/2014  FINDINGS: Mild cardiomegaly is stable. Aortic and hilar contours are unchanged. Bilateral central venous stents, new on the right from prior. There is no edema, consolidation, effusion, or pneumothorax.  IMPRESSION: No active disease.   Electronically Signed   By: Monte Fantasia M.D.   On: 08/25/2014 15:55     Microbiology: Recent Results (from the past 240 hour(s))  Culture, blood (routine x 2)     Status: None (Preliminary result)   Collection Time: 08/25/14  3:44 PM  Result Value Ref Range Status   Specimen Description BLOOD LEFT ARM  Final   Special Requests BOTTLES DRAWN AEROBIC AND ANAEROBIC 10CC  Final   Culture   Final           BLOOD CULTURE RECEIVED NO GROWTH TO DATE CULTURE WILL BE HELD FOR 5 DAYS BEFORE ISSUING A FINAL NEGATIVE REPORT Performed at [facility]    Report Status PENDING  Incomplete  Culture, blood (routine x 2)     Status: None (Preliminary result)   Collection Time: 08/25/14  5:00 PM  Result Value Ref Range Status   Specimen Description BLOOD WRIST LEFT  Final   Special Requests BOTTLES DRAWN AEROBIC ONLY [place]  Final   Culture   Final           BLOOD CULTURE RECEIVED NO GROWTH TO DATE CULTURE WILL BE HELD FOR 5 DAYS BEFORE ISSUING A FINAL NEGATIVE REPORT Performed at [facility]    Report Status PENDING  Incomplete     Labs: Basic Metabolic Panel:  Recent Labs Lab 08/25/14 1536 08/25/14 1549 08/26/14 0332 08/28/14 0700 08/29/14 0503  NA 139 137 138 134* 136  K 3.6 3.5 4.0 3.3* 3.9  CL 96 97 98 97 101  CO2 28  --  28 26 22   GLUCOSE 126* 128* 102* 87 92  BUN 31* 32* 39* 30* 44*  CREATININE 6.60* 6.30* 7.59* 6.95* 8.81*  CALCIUM 9.2  --  8.7 8.6 8.8   Liver Function Tests:  Recent Labs Lab 08/25/14 1536 08/26/14 0332  AST 29 24  ALT 16 12  ALKPHOS 82 66  BILITOT 0.8 0.8  PROT 6.2 5.4*  ALBUMIN 3.0* 2.7*    Recent Labs Lab 08/25/14 1536  LIPASE 34   No results for input(s): AMMONIA in the last 168 hours. CBC:  Recent Labs Lab 08/25/14 1536 08/25/14 1549 08/26/14 0332 08/27/14 0425 08/28/14 0700 08/29/14 0503  WBC 8.1  --  6.8 4.3 4.2 3.1*  NEUTROABS 5.7  --   --   --   --   --   HGB 11.9* 12.9* 10.0* 9.2* 9.3* 9.0*  HCT 37.7* 38.0* 31.9* 29.2* 29.6* 29.1*  MCV 97.2  --  95.8 96.4 96.7 96.4  PLT 87*  --  79* 71* 91* 92*   Cardiac Enzymes: No results for input(s): CKTOTAL, CKMB, CKMBINDEX, TROPONINI in the last 168 hours. BNP: Invalid input(s): POCBNP CBG:  Recent Labs Lab 08/28/14 0619 08/28/14 1206 08/28/14 1710 08/28/14 2136 08/29/14 0627  GLUCAP 88 109* 120* 161* 93  Time coordinating discharge:  Greater than 30 minutes  Signed:  Leonela Kivi, DO [facility] Pager: [phone] 08/29/2014, 11:47 AM

## 2014-08-29 NOTE — Progress Notes (Signed)
ANTICOAGULATION CONSULT NOTE - Follow Up Consult  Pharmacy Consult for Coumadin Indication: history of DVTs, new LUA/rIJ DVT  Allergies  Allergen Reactions  . Lisinopril Swelling    Lips and tongue swell  . Niacin And Related Other (See Comments)    unknown  . Norvasc [Amlodipine Besylate] Rash    Flushing  . Penicillins Rash    Patient Measurements: Height: 6' (182.9 cm) Weight: 165 lb 12.6 oz (75.2 kg) (stood to scale ) IBW/kg (Calculated) : 77.6 Heparin Dosing Weight:   Vital Signs: Temp: 97.8 F (36.6 C) (04/04 0654) Temp Source: Oral (04/04 0654) BP: 129/63 mmHg (04/04 1030) Pulse Rate: 81 (04/04 1030)  Labs:  Recent Labs  08/27/14 0425 08/27/14 1214 08/28/14 0700 08/29/14 0503  HGB 9.2*  --  9.3* 9.0*  HCT 29.2*  --  29.6* 29.1*  PLT 71*  --  91* 92*  LABPROT  --  25.4* 27.0* 30.0*  INR  --  2.29* 2.47* 2.83*  HEPARINUNFRC 0.55  --  0.53 0.80*  CREATININE  --   --  6.95* 8.81*    Estimated Creatinine Clearance: 8.4 mL/min (by C-G formula based on Cr of 8.81).   Medications:  Scheduled:  . atorvastatin  40 mg Oral Daily  . azaTHIOprine  75 mg Oral q morning - 10a  . calcium acetate  667 mg Oral TID WC  . carvedilol  6.25 mg Oral QHS  . colesevelam  3,750 mg Oral Daily  . [START ON 08/31/2014] darbepoetin (ARANESP) injection - DIALYSIS  100 mcg Intravenous Q Wed-HD  . doxercalciferol      . doxercalciferol  4 mcg Intravenous Q M,W,F-HD  . famotidine  10 mg Oral QHS  . insulin aspart  0-9 Units Subcutaneous TID WC  . insulin glargine  5 Units Subcutaneous q morning - 10a  . isosorbide dinitrate  40 mg Oral TID  . multivitamin  1 tablet Oral QHS  . predniSONE  5 mg Oral Q breakfast  . sirolimus  2 mg Oral Daily  . sodium chloride  3 mL Intravenous Q12H  . sodium chloride  3 mL Intravenous Q12H  . vancomycin  750 mg Intravenous Q M,W,F-HD  . warfarin  5 mg Oral q1800  . 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  INR this AM 2.83, reflecting 7.5mg  given on 4/2.  New INR goal this admit is 2.5-3 & pt was slightly under this on admission at 2.44, on home dose of 5mg  daily except 2.5mg  on Tue/Thurs.  Have recommended Coumadin 5mg  daily to DrTat, which will be a 5mg /wk increase in dose & expect this to be sufficient to bump him into the new target range.  INR to be drawn 4/6.  Goal of Therapy:  INR 2.5-3 Monitor platelets by anticoagulation protocol: Yes   Plan:  1-  D/C Heparin and heparin labs 2-  Coumadin 5mg  daily 3-  Continue daily INR  Gracy Bruins, PharmD Clinical Pharmacist [facility]

## 2014-08-29 NOTE — Progress Notes (Signed)
Patient provided with discharge instructions and follow up information. He is going home at this time with his family.

## 2014-08-29 NOTE — Progress Notes (Signed)
Attempted to call and schedule follow up appointment as ordered with Dr Heath Lark regarding recurrent DVTs. Left voicemail with scheduler at the office. Call back number left with office in order to set up outpatient follow up appointment.

## 2014-08-29 NOTE — Progress Notes (Signed)
[facility] Progress Note  Assessment/Plan: 1. LUA and r IJ DVT- history of DVT and on coumadin. INR was therapeutic. Hypercoag panel pending. 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 Cellulitis- on vanc and cefepime.d/cd Blood cultures negative. Looks better clinically-Afebrile- immune supression makes him more susceptible to infection 3. ESRD - MWF via AVF K 3.9 4. Hypertension/volume - BP 120 - 150s On carvedilol 6.25. Getting to EDW. uf goal usually around 3L 5. Anemia - hgb 9, cont Aranesp q Wed. No Fe 6. Metabolic bone disease - Ca+ 8.8/ last phos 5.1 and PTH 489 3/23- cont hectorol/phoslo 7. Nutrition - renal diet. Vitamin. alb 2.7 8. DM- per primary BS controlled   9.       S/p heart transplant- on imuran and sirolimus- prednisone was actually added at the time of renal tranplant but has stayed on- decreased to 5 and pt will tell us if he has ill effects from this and will continue slow wean as OP    10.      Dispo- renal is OK with discharge- per primary team- on IV Vanc q HD - can continue after d/c if desired   11.     Thrombocytopenia - 70 - 90s - ok to stay on coumadin as long as > 50K per Dr. Kipp Laurence, PA-C [place] [phone] 08/29/2014,9:13 AM  LOS: 4 days   Pt seen, examined and agree w A/P as above.  Kelly Splinter MD pager [phone]    cell [phone] 08/29/2014, 10:33 AM    Subjective:   Hopes to go home today  Objective Filed Vitals:   08/29/14 0730 08/29/14 0800 08/29/14 0830 08/29/14 0900  BP: 137/63 154/72 136/68 121/55  Pulse: 73 72 73 73  Temp:      TempSrc:      Resp:      Height:      Weight:      SpO2:       Physical Exam General: NAD on HD  Heart: RRR Lungs: no rales Abdomen: soft NT Extremities: no LE edema bilateral UE edema but left upper erythema improved per pt Dialysis  Access: right AVF  Dialysis Orders: MWF NW 4hr 72kgs 2K/2.25Ca 400/1.5 R AVF 3000u heparin Aranesp 100 q weds 4 hectorol   Additional Objective Labs: Lab Results  Component Value Date   INR 2.83* 08/29/2014   INR 2.47* 08/28/2014   INR 2.29* 77/82/4235   Basic Metabolic Panel:  Recent Labs Lab 08/26/14 0332 08/28/14 0700 08/29/14 0503  NA 138 134* 136  K 4.0 3.3* 3.9  CL 98 97 101  CO2 28 26 22   GLUCOSE 102* 87 92  BUN 39* 30* 44*  CREATININE 7.59* 6.95* 8.81*  CALCIUM 8.7 8.6 8.8   Liver Function Tests:  Recent Labs Lab 08/25/14 1536 08/26/14 0332  AST 29 24  ALT 16 12  ALKPHOS 82 66  BILITOT 0.8 0.8  PROT 6.2 5.4*  ALBUMIN 3.0* 2.7*    Recent Labs Lab 08/25/14 1536  LIPASE 34   CBC:  Recent Labs Lab 08/25/14 1536  08/26/14 0332 08/27/14 0425 08/28/14 0700 08/29/14 0503  WBC 8.1  --  6.8 4.3 4.2 3.1*  NEUTROABS 5.7  --   --   --   --   --   HGB 11.9*  < > 10.0* 9.2* 9.3* 9.0*  HCT  37.7*  < > 31.9* 29.2* 29.6* 29.1*  MCV 97.2  --  95.8 96.4 96.7 96.4  PLT 87*  --  79* 71* 91* 92*  < > = values in this interval not displayed. Blood Culture    Component Value Date/Time   SDES BLOOD WRIST LEFT 08/25/2014 1700   SPECREQUEST BOTTLES DRAWN AEROBIC ONLY 7CC 08/25/2014 1700   CULT  08/25/2014 1700           BLOOD CULTURE RECEIVED NO GROWTH TO DATE CULTURE WILL BE HELD FOR 5 DAYS BEFORE ISSUING A FINAL NEGATIVE REPORT Performed at [facility] PENDING 08/25/2014 1700  CBG:  Recent Labs Lab 08/28/14 0619 08/28/14 1206 08/28/14 1710 08/28/14 2136 08/29/14 0627  GLUCAP 88 109* 120* 161* 93  Medications:   . atorvastatin  40 mg Oral Daily  . azaTHIOprine  75 mg Oral q morning - 10a  . calcium acetate  667 mg Oral TID WC  . carvedilol  6.25 mg Oral QHS  . colesevelam  3,750 mg Oral Daily  . [START ON 08/31/2014] darbepoetin (ARANESP) injection - DIALYSIS  100 mcg Intravenous Q Wed-HD  . doxercalciferol  4 mcg  Intravenous Q M,W,F-HD  . famotidine  10 mg Oral QHS  . insulin aspart  0-9 Units Subcutaneous TID WC  . insulin glargine  5 Units Subcutaneous q morning - 10a  . isosorbide dinitrate  40 mg Oral TID  . multivitamin  1 tablet Oral QHS  . predniSONE  5 mg Oral Q breakfast  . sirolimus  2 mg Oral Daily  . sodium chloride  3 mL Intravenous Q12H  . sodium chloride  3 mL Intravenous Q12H  . vancomycin  750 mg Intravenous Q M,W,F-HD  . warfarin  5 mg Oral q1800  . Warfarin - Pharmacist Dosing Inpatient   Does not apply [phone]

## 2014-08-30 LAB — PTT-LA MIX: PTT-LA MIX: 43.1 s (ref 0.0–50.0)

## 2014-08-30 LAB — LUPUS ANTICOAGULANT PANEL
DRVVT: 54.8 s (ref 0.0–55.1)
PTT LA: 58.6 s — AB (ref 0.0–50.0)

## 2014-08-30 LAB — PTT-LA INCUB MIX: PTT-LA Incub Mix: 55.1 s — ABNORMAL HIGH (ref 0.0–50.0)

## 2014-08-30 LAB — PROTEIN S ACTIVITY: Protein S Activity: 30 % — ABNORMAL LOW (ref 60–145)

## 2014-08-30 LAB — PROTEIN C ACTIVITY: Protein C Activity: 17 % — ABNORMAL LOW (ref 74–151)

## 2014-08-30 LAB — PROTEIN S, TOTAL: Protein S Ag, Total: 77 % (ref 58–150)

## 2014-08-30 LAB — HEXAGONAL PHASE PHOSPHOLIPID: Hexagonal Phase Phospholipid: 25.9 s — ABNORMAL HIGH (ref 0.0–8.0)

## 2014-08-31 ENCOUNTER — Telehealth: Payer: Self-pay | Admitting: Oncology

## 2014-08-31 LAB — PROTHROMBIN GENE MUTATION

## 2014-08-31 LAB — CULTURE, BLOOD (ROUTINE X 2): Culture: NO GROWTH

## 2014-08-31 NOTE — Telephone Encounter (Signed)
LEFT MESSAGE FOR PATIENT TO RETURN CALL TO SCHEDULE APPT.

## 2014-09-01 LAB — CULTURE, BLOOD (ROUTINE X 2): CULTURE: NO GROWTH

## 2014-09-05 ENCOUNTER — Telehealth: Payer: Self-pay | Admitting: Hematology and Oncology

## 2014-09-05 NOTE — Telephone Encounter (Signed)
pt called to sched appt....pt is a new pt....transferred to [employer].

## 2014-09-29 ENCOUNTER — Encounter: Payer: Self-pay | Admitting: Hematology and Oncology

## 2014-09-29 ENCOUNTER — Ambulatory Visit: Payer: Medicare Other

## 2014-09-29 ENCOUNTER — Ambulatory Visit (HOSPITAL_BASED_OUTPATIENT_CLINIC_OR_DEPARTMENT_OTHER): Payer: Medicare Other | Admitting: Hematology and Oncology

## 2014-09-29 VITALS — BP 113/55 | HR 95 | Temp 97.7°F | Resp 16 | Ht 72.0 in | Wt 161.6 lb

## 2014-09-29 DIAGNOSIS — I82B12 Acute embolism and thrombosis of left subclavian vein: Secondary | ICD-10-CM | POA: Diagnosis present

## 2014-09-29 DIAGNOSIS — Z992 Dependence on renal dialysis: Secondary | ICD-10-CM | POA: Diagnosis not present

## 2014-09-29 DIAGNOSIS — Z941 Heart transplant status: Secondary | ICD-10-CM

## 2014-09-29 DIAGNOSIS — N186 End stage renal disease: Secondary | ICD-10-CM

## 2014-09-29 DIAGNOSIS — D696 Thrombocytopenia, unspecified: Secondary | ICD-10-CM

## 2014-09-29 DIAGNOSIS — D6959 Other secondary thrombocytopenia: Secondary | ICD-10-CM

## 2014-09-29 DIAGNOSIS — D638 Anemia in other chronic diseases classified elsewhere: Secondary | ICD-10-CM

## 2014-09-29 DIAGNOSIS — I82409 Acute embolism and thrombosis of unspecified deep veins of unspecified lower extremity: Secondary | ICD-10-CM

## 2014-09-29 DIAGNOSIS — I82C11 Acute embolism and thrombosis of right internal jugular vein: Secondary | ICD-10-CM | POA: Diagnosis not present

## 2014-09-29 DIAGNOSIS — D702 Other drug-induced agranulocytosis: Secondary | ICD-10-CM

## 2014-09-29 DIAGNOSIS — T50905A Adverse effect of unspecified drugs, medicaments and biological substances, initial encounter: Secondary | ICD-10-CM

## 2014-09-29 NOTE — Progress Notes (Signed)
Addendum to problem related to thrombocytopenia: Correction, the patient is not receiving chemotherapy. When I mean was, he can continue on chronic immunosuppressive therapy at current dose without adjustment, but the treatment dose might needs to be adjusted if platelet count is less than 50,000

## 2014-09-29 NOTE — Assessment & Plan Note (Signed)
He is getting hemodialysis 3 times a week on Mondays, Wednesdays and Fridays.

## 2014-09-29 NOTE — Assessment & Plan Note (Signed)
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 He is asymptomatic. Continue the same. I will defer to the nephrologist for management of his antirejection medicine.

## 2014-09-29 NOTE — Assessment & Plan Note (Signed)
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 I told the patient he will be prone to develop blood clots. Due to the chronic renal failure status, he does not qualify for any other form of anticoagulation therapy. I have recommended increasing the INR range to 2.5-3 and it is working well. He denies any excessive bleeding complication. If he clots again in the future, we could consider adding 81 mg aspirin. However, I recommend holding of addition of aspirin due to bleeding risk from chronic thrombocytopenia, related to his chronic immunosuppressive therapy. He is in agreement with the plan.

## 2014-09-29 NOTE — Progress Notes (Signed)
Checked in new pt with no financial concerns. °

## 2014-09-29 NOTE — Progress Notes (Signed)
[facility] OFFICE PROGRESS NOTE  [name], MD SUMMARY OF HEMATOLOGIC HISTORY: Please see my detailed consult note dated 08/26/2014 for further details. He had remote history of right lower extremity DVT. The patient had history for heart transplant for ischemic cardiomyopathy and renal transplant. His renal transplant failed 3 years ago and he had been HD dependent via fistula. He remained on chronic anti-rejection medications and had recent recurrent hospitalization for sepsis/ pneumonia. His last hospitalization was in December 2015 for fever and right IJ DVT requiring fistulogram and venoplasty on 05/09/14. He was discharged on warfarin with PCP monitoring, goal INR 2-3 with duration life-long due to recurrent DVT. According to him his INR had been therapeutic. In April 2016, he developed sudden acute left upper extremity swelling with warmth, tenderness & erythema. He denies recent chest pain on exertion, shortness of breath on minimal exertion, pre-syncopal episodes, hemoptysis, or palpitation. He denies recent history of trauma, long distance travel, dehydration, recent surgery, smoking or prolonged immobilization. Results from venous Doppler showed partial acute left mid distal subclavian vein & nearly occluded acute DVT or right jugular vein. He had prior surgeries before and never had perioperative thromboembolic events. There is no family history of blood clots or miscarriages. He is admitted with IV heparin and subsequently transitioned to warfarin therapy, with goal INR increased to 2.5-3. INTERVAL HISTORY: Brandon Sharp 69 y.o. male returns for further follow-up. He is doing well. His anticoagulation therapy is managed by primary care provider. He continues on hemodialysis 3 times a week on Mondays, Wednesdays and Fridays and he has been getting Erythropoietin stimulating agent during dialysis.  The patient denies any recent signs or symptoms of bleeding such  as spontaneous epistaxis, hematuria or hematochezia. Denies recent infection. He has chronic right upper extremity edema due to presence of dialysis fistula I have reviewed the past medical history, past surgical history, social history and family history with the patient and they are unchanged from previous note.  ALLERGIES:  is allergic to lisinopril; niacin and related; norvasc; and penicillins.  MEDICATIONS:  Current Outpatient Prescriptions  Medication Sig Dispense Refill  . atorvastatin (LIPITOR) 40 MG tablet Take 40 mg by mouth daily.      Marland Kitchen azaTHIOprine (IMURAN) 50 MG tablet Take 75 mg by mouth every morning.     Marland Kitchen b complex-vitamin c-folic acid (NEPHRO-VITE) 0.8 MG TABS tablet Take 1 tablet by mouth at bedtime.    . calcium acetate (PHOSLO) 667 MG capsule Take 667 mg by mouth 3 (three) times daily with meals.     . carvedilol (COREG) 6.25 MG tablet Take 1 tablet (6.25 mg total) by mouth at bedtime. 30 tablet 0  . colesevelam (WELCHOL) 625 MG tablet Take 3,750 mg by mouth daily. Takes 6 tabs daily    . famotidine (PEPCID AC) 10 MG chewable tablet Chew 10 mg by mouth at bedtime.     Marland Kitchen HUMALOG KWIKPEN 100 UNIT/ML SOPN Inject 6-10 Units into the skin 2 (two) times daily. Per sliding scale    . insulin glargine (LANTUS) 100 UNIT/ML injection Inject 20 Units into the skin every morning.    . isosorbide dinitrate (ISORDIL) 40 MG tablet Take 40 mg by mouth 3 (three) times daily.      . predniSONE (DELTASONE) 5 MG tablet Take 1 tablet (5 mg total) by mouth daily with breakfast. 30 tablet 0  . sirolimus (RAPAMUNE) 2 MG tablet Take 2 mg by mouth daily.    Marland Kitchen warfarin (COUMADIN) 5  MG tablet Take 1 tablet (5 mg total) by mouth daily. 30 tablet 0  . [DISCONTINUED] doxazosin (CARDURA) 8 MG tablet Take 8 mg by mouth 2 (two) times daily.      . [DISCONTINUED] hydrALAZINE (APRESOLINE) 25 MG tablet Take 75 mg by mouth 3 (three) times daily.       No current facility-administered medications for this  visit.     REVIEW OF SYSTEMS:   Constitutional: Denies fevers, chills or night sweats Eyes: Denies blurriness of vision Ears, nose, mouth, throat, and face: Denies mucositis or sore throat Respiratory: Denies cough, dyspnea or wheezes Cardiovascular: Denies palpitation, chest discomfort or lower extremity swelling Gastrointestinal:  Denies nausea, heartburn or change in bowel habits Skin: Denies abnormal skin rashes Lymphatics: Denies new lymphadenopathy or easy bruising Neurological:Denies numbness, tingling or new weaknesses Behavioral/Psych: Mood is stable, no new changes  All other systems were reviewed with the patient and are negative.  PHYSICAL EXAMINATION: ECOG PERFORMANCE STATUS: 1 - Symptomatic but completely ambulatory  Filed Vitals:   09/29/14 1337  BP: 113/55  Pulse: 95  Temp: 97.7 F (36.5 C)  Resp: 16   Filed Weights   09/29/14 1337  Weight: 161 lb 9.6 oz (73.301 kg)    GENERAL:alert, no distress and comfortable SKIN: skin color, texture, turgor are normal, no rashes or significant lesions EYES: normal, Conjunctiva are pink and non-injected, sclera clear OROPHARYNX:no exudate, no erythema and lips, buccal mucosa, and tongue normal  Musculoskeletal:no cyanosis of digits and no clubbing  NEURO: alert & oriented x 3 with fluent speech, no focal motor/sensory deficits Noted mild right upper extremity edema  LABORATORY DATA:  I have reviewed the data as listed No results found for this or any previous visit (from the past 48 hour(s)).  Lab Results  Component Value Date   WBC 3.1* 08/29/2014   HGB 9.0* 08/29/2014   HCT 29.1* 08/29/2014   MCV 96.4 08/29/2014   PLT 92* 08/29/2014    RADIOGRAPHIC STUDIES: I reviewed the CT scan with the patient and his wife regarding the presence of the stents I have personally reviewed the radiological images as listed and agreed with the findings in the report.  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 I told the patient he will be prone to develop blood clots. Due to the chronic renal failure status, he does not qualify for any other form of anticoagulation therapy. I have recommended increasing the INR range to 2.5-3 and it is working well. He denies any excessive bleeding complication. If he clots again in the future, we could consider adding 81 mg aspirin. However, I recommend holding of addition of aspirin due to bleeding risk from chronic thrombocytopenia, related to his chronic immunosuppressive therapy. He is in agreement with the plan.   Anemia in chronic illness This is multifactorial, related to anemia of chronic renal failure. He is getting erythropoietin stimulating agent through the nephrology office. Recommend continue the same.   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 He is asymptomatic. Continue the same. I will defer to the nephrologist for management of his antirejection medicine.   ESRD on hemodialysis He is getting hemodialysis 3 times a week on Mondays, Wednesdays and Fridays.    Heart transplanted The patient is chronically immunosuppressed due to the hot transplant status. So far, his heart is working well. Continue his chronic immunosuppressive therapy as above   Thrombocytopenia  due to drugs This is likely due to immunosuppressive treatment. The patient denies recent history of bleeding such as epistaxis, hematuria or hematochezia. He is asymptomatic from the low platelet count. I will observe for now.  he does not require transfusion now. I will continue the chemotherapy at current dose without dosage adjustment.  If the thrombocytopenia gets progressive worse in the future, I might have to delay his treatment or adjust the chemotherapy dose. There is no contraindication for him to remain on anticoagulation therapy or antiplatelet agents as  well as blood count is greater than 50,000      All questions were answered. The patient knows to call the clinic with any problems, questions or concerns. No barriers to learning was detected.  I spent 25 minutes counseling the patient face to face. The total time spent in the appointment was 30 minutes and more than 50% was on counseling.     Alvy Bimler, Keegen Heffern, MD 5/5/20162:11 PM

## 2014-09-29 NOTE — Assessment & Plan Note (Signed)
This is likely due to immunosuppressive treatment. The patient denies recent history of bleeding such as epistaxis, hematuria or hematochezia. He is asymptomatic from the low platelet count. I will observe for now.  he does not require transfusion now. I will continue the chemotherapy at current dose without dosage adjustment.  If the thrombocytopenia gets progressive worse in the future, I might have to delay his treatment or adjust the chemotherapy dose. There is no contraindication for him to remain on anticoagulation therapy or antiplatelet agents as well as blood count is greater than 50,000

## 2014-09-29 NOTE — Assessment & Plan Note (Signed)
This is multifactorial, related to anemia of chronic renal failure. He is getting erythropoietin stimulating agent through the nephrology office. Recommend continue the same.

## 2014-09-29 NOTE — Assessment & Plan Note (Signed)
The patient is chronically immunosuppressed due to the hot transplant status. So far, his heart is working well. Continue his chronic immunosuppressive therapy as above

## 2014-10-04 ENCOUNTER — Other Ambulatory Visit: Payer: Self-pay

## 2014-10-04 DIAGNOSIS — T82510A Breakdown (mechanical) of surgically created arteriovenous fistula, initial encounter: Secondary | ICD-10-CM

## 2014-10-06 ENCOUNTER — Encounter: Payer: Self-pay | Admitting: Surgery

## 2014-10-07 ENCOUNTER — Encounter: Payer: Self-pay | Admitting: Vascular Surgery

## 2014-10-07 ENCOUNTER — Ambulatory Visit: Payer: Medicare Other | Admitting: Surgery

## 2014-10-07 ENCOUNTER — Encounter (HOSPITAL_COMMUNITY): Payer: Medicare Other

## 2014-10-11 ENCOUNTER — Ambulatory Visit (HOSPITAL_COMMUNITY)
Admission: RE | Admit: 2014-10-11 | Discharge: 2014-10-11 | Disposition: A | Discharge status: Home / Self Care | Payer: Medicare Other | Source: Ambulatory Visit | Attending: Vascular Surgery | Admitting: Vascular Surgery

## 2014-10-11 ENCOUNTER — Ambulatory Visit (INDEPENDENT_AMBULATORY_CARE_PROVIDER_SITE_OTHER): Payer: Medicare Other | Admitting: Vascular Surgery

## 2014-10-11 ENCOUNTER — Encounter: Payer: Self-pay | Admitting: Vascular Surgery

## 2014-10-11 VITALS — BP 105/58 | HR 86 | Ht 72.0 in | Wt 161.0 lb

## 2014-10-11 DIAGNOSIS — T82510A Breakdown (mechanical) of surgically created arteriovenous fistula, initial encounter: Secondary | ICD-10-CM | POA: Diagnosis not present

## 2014-10-11 DIAGNOSIS — Z992 Dependence on renal dialysis: Secondary | ICD-10-CM | POA: Diagnosis not present

## 2014-10-11 DIAGNOSIS — Z01818 Encounter for other preprocedural examination: Secondary | ICD-10-CM | POA: Insufficient documentation

## 2014-10-11 DIAGNOSIS — N186 End stage renal disease: Secondary | ICD-10-CM | POA: Diagnosis not present

## 2014-10-11 NOTE — Progress Notes (Signed)
Brief History and Physical  History of Present Illness  Brandon Sharp is a 69 y.o. male who presents with chief complaint: non functioning left and now right upper extremity AV fistula.  1 Week ago his right BC fistula stopped functioning and a left IJ catheter was placed by Dr. Augustin Sharp.   He has a history of venous stenosis and DVT in the right jugular vein 04/2015.  He has no access options in either upper extremity at this time.   Of note, he is on chronic immunosuppressive therapy as he has undergone a heart transplant in 2001. 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  He does have symptoms of claudication in both lower extremities equally in the thighs and calves.  He denies history of ulcer or infections in bilateral LE.  Other past medical history includes DM Type I on insulin, coumadin therapy for DVT in his upper extremity times 2.  He is on dialysis MWF.  Past Medical History  Diagnosis Date  . Diabetes mellitus   . Hypertension   . Myocardial infarction 1985; 1990  . Angina   . Coronary artery disease   . Dysrhythmia   . DVT (deep venous thrombosis) ~ 12/2010    LLE  . Anemia   . Blood transfusion 06/1999    post heart transplant  . Peripheral vascular disease   . CHF (congestive heart failure) 2001    beffore transplant  . Renal failure     Hemodialysis MWF last 2 years, sse dr Brandon Sharp, goes to [facility]  . DVT (deep venous thrombosis) 08/25/2014    Nearly occluded R IJ, and L subclavian DVT    Past Surgical History  Procedure Laterality Date  . Nephrectomy transplanted organ  2008  . Av fistula repair      rt. a=fore arm fistula  . Av fistula placement  ~ 01/2010    "this was my 2nd fistula placement"  . Heart transplant  06/1999  . Coronary angioplasty with stent placement  1985; 1990  . Colonoscopy  03/17/2012    Procedure: COLONOSCOPY;  Surgeon: Brandon Ng, MD;  Location: [facility];  Service:  Endoscopy;  Laterality: N/A;  . Coronary artery bypass graft  1990    CABG X3  . Colonoscopy with propofol N/A 12/14/2013    Procedure: COLONOSCOPY WITH PROPOFOL;  Surgeon: Brandon Ng, MD;  Location: [facility];  Service: Endoscopy;  Laterality: N/A;  . Video bronchoscopy Bilateral 04/14/2014    Procedure: VIDEO BRONCHOSCOPY WITH FLUORO;  Surgeon: Brandon Gobble, MD;  Location: [facility];  Service: Cardiopulmonary;  Laterality: Bilateral;  . Shuntogram Right 05/09/2014    Procedure: FISTULOGRAM;  Surgeon: Brandon Westminster, MD;  Location: [facility] CATH LAB;  Service: Cardiovascular;  Laterality: Right;    History   Social History  . Marital Status: Married    Spouse Name: N/A  . Number of Children: N/A  . Years of Education: N/A   Occupational History  . Not on file.   Social History Main Topics  . Smoking status: Former Smoker -- 1.00 packs/day for 20 years    Types: Cigarettes    Quit date: 05/27/1988  . Smokeless tobacco: Never Used  . Alcohol Use: Yes     Comment: "Holidays"  . Drug Use: No  . Sexual Activity: Not on file   Other Topics Concern  . Not on file   Social History Narrative    Family  History  Problem Relation Age of Onset  . Heart disease Mother   . Hyperlipidemia Mother   . Hypertension Mother   . Diabetes Mother   . Hyperlipidemia Father   . Hypertension Father   . Heart disease Father   . Diabetes Brother   . Heart disease Brother     Heart Disease before age 66  . Hyperlipidemia Brother   . Hypertension Brother   . Heart attack Brother     Current Outpatient Prescriptions on File Prior to Visit  Medication Sig Dispense Refill  . atorvastatin (LIPITOR) 40 MG tablet Take 40 mg by mouth daily.      Marland Kitchen azaTHIOprine (IMURAN) 50 MG tablet Take 75 mg by mouth every morning.     Marland Kitchen b complex-vitamin c-folic acid (NEPHRO-VITE) 0.8 MG TABS tablet Take 1 tablet by mouth at bedtime.    . calcium acetate (PHOSLO) 667 MG capsule Take 667 mg by mouth 3  (three) times daily with meals.     . carvedilol (COREG) 6.25 MG tablet Take 1 tablet (6.25 mg total) by mouth at bedtime. 30 tablet 0  . colesevelam (WELCHOL) 625 MG tablet Take 3,750 mg by mouth daily. Takes 6 tabs daily    . famotidine (PEPCID AC) 10 MG chewable tablet Chew 10 mg by mouth at bedtime.     Marland Kitchen HUMALOG KWIKPEN 100 UNIT/ML SOPN Inject 6-10 Units into the skin 2 (two) times daily. Per sliding scale    . insulin glargine (LANTUS) 100 UNIT/ML injection Inject 20 Units into the skin every morning.    . isosorbide dinitrate (ISORDIL) 40 MG tablet Take 40 mg by mouth 3 (three) times daily.      . predniSONE (DELTASONE) 5 MG tablet Take 1 tablet (5 mg total) by mouth daily with breakfast. 30 tablet 0  . sirolimus (RAPAMUNE) 2 MG tablet Take 1.5 mg by mouth daily.     Marland Kitchen warfarin (COUMADIN) 5 MG tablet Take 1 tablet (5 mg total) by mouth daily. 30 tablet 0  . [DISCONTINUED] doxazosin (CARDURA) 8 MG tablet Take 8 mg by mouth 2 (two) times daily.      . [DISCONTINUED] hydrALAZINE (APRESOLINE) 25 MG tablet Take 75 mg by mouth 3 (three) times daily.       No current facility-administered medications on file prior to visit.    Allergies  Allergen Reactions  . Lisinopril Swelling    Lips and tongue swell  . Niacin And Related Other (See Comments)    unknown  . Norvasc [Amlodipine Besylate] Rash    Flushing  . Penicillins Rash    Review of Systems: As listed above, otherwise negative.  Physical Examination  Filed Vitals:   10/11/14 1415  BP: 105/58  Pulse: 86  Height: 6' (1.829 m)  Weight: 161 lb (73.029 kg)  SpO2: 98%    General: A&O x 3, Girard  Pulmonary: Sym exp, good air movt, CTAB, no rales, rhonchi, & wheezing  Cardiac: RRR, Nl S1, S2, no Murmurs, rubs or gallops  Gastrointestinal: soft, NTND, -G/R, - HSM, - masses, - CVAT B  Musculoskeletal: M/S grossly equal 5/5 throughout , Extremities without ischemic changes   Vascular Palpable right femoral pulse 2-3 +, left  femoral pulse 1-2+   Laboratory See iStat  Vascular lab study 10/11/2014 Left upper extremity duplex shows thrombus left subclavian and axillary veins. Chronic DVT left subclavian stent.  Medical Decision Making  Brandon Sharp is a 69 y.o. male who presents with: ESRD s/p failed  kidney transplant 2008.  He has had fistula creations in bilateral upper extremities that have clotted off and in-stent stenosis in right subclavian and innominate vein.   At this time his only option is a right thigh graft.  He has palpable femoral pulse.  We will schedule him for 10/20/2014 right thigh graft.  He will need to hold his coumadin for 4 days and bridge with Lovenox if his primary Dr. Melford Aase suggested it.  We prefer the INR to be 1.5 or less for the day of the procedure.  We also suggest he bring all his home medications to the hospital the morning of surgery.  He should take half his insulin dose that am because he will be NPO.     Theda Sers, Kamika Goodloe [facility] PA-C [facility] [place] Office: [phone]  The patient was seen in conjunction with Dr. Kellie Simmering   10/11/2014, 2:50 PM   agree with above assessment  the disc of the procedure performed by Dr. Augustin Sharp  was reviewed and ultrasound in the office reveals chronic occlusion left subclavian axillary vein area where stent was placed many years ago. Left upper arm fistula has been occluded for 8 years and is not a candidate for hybrid graft    only good site the present time his right thigh graft which we will insert on Thursday May 26. Discussed with patient and he is in agreement

## 2014-10-14 ENCOUNTER — Other Ambulatory Visit: Payer: Self-pay

## 2014-10-19 ENCOUNTER — Encounter (HOSPITAL_COMMUNITY): Payer: Self-pay | Admitting: Vascular Surgery

## 2014-10-19 MED ORDER — SODIUM CHLORIDE 0.9 % IV SOLN
INTRAVENOUS | Status: DC
Start: 2014-10-20 — End: 2014-10-20
  Administered 2014-10-20: 09:00:00 via INTRAVENOUS

## 2014-10-19 MED ORDER — VANCOMYCIN HCL IN DEXTROSE 1-5 GM/200ML-% IV SOLN
1000.0000 mg | INTRAVENOUS | Status: AC
  Administered 2014-10-20: 1000 mg via INTRAVENOUS
  Filled 2014-10-19: qty 200

## 2014-10-19 NOTE — Progress Notes (Signed)
Anesthesia Chart Review: SAME DAY WORK-UP.  Patient is a 69 year old male posted for insertion of a thigh AVGG tomorrow by Dr. Bridgett Larsson.  History includes former smoker, ESRD on HD MWF s/p failed kidney transplant '08 (non-functioning RUE AVF and occluded LUE AVF; by [facility] notes has left IJ HD catheter by Dr. Augustin Coupe), left subclavian vein DVT 08/25/14 with history of right IJ DVT 05/06/14 and s/p overlapping right subclavian and innominate vein stent 12/05/11 with in-stent stenosis ("chronic occlusion" by 10/11/14 [facility] note) and history of LLE DVT '12, CAD/MI s/p CABG X 3 '90's ([place]), cardiac transplant 2001 [facility]) s/p BMS to mid LAD and proximal RCA 04/21/13 [facility]), CHF s/p AICD with removal 07/19/99 following successful heart transplant [facility]), venous stasis, DM1 on insulin, anemia. He was admitted to [facility] 04/11/14 for fever of unknown origin with recent history of CAP. He had bronchoscopy with endobronchial and transbronchial biopsies that were negative for dysplasia or malignancy.   PCP is Dr. Anastasia Pall [facility]; see Care Everywhere). He is managing Lovenox while Coumadin is on hold for surgery (held 4 days preoperatively). Hematologist is Dr. Alvy Bimler who recommended keeping INR range between 2.5-3.0 due to recurrent DVT. 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   Last transplant cardiology visit was on 09/01/14 with Dr. Corky Mull [facility]; see Care Everywhere). He is due for his yearly health maintenance stress test. He required PCI in 2014, but not in 09/2013.   Current meds list includes Lipitor, Imuran, Nephrovite, Phoslo, Coreg, Welchol, Pepcid, Humalog, Lantus, isosorbid dinitrate, prednisone, Sirolimus, warfarin. Pharmacy has left a voice message for patient to call to update his list prior to surgery.   08/25/14 EKG: NSR, right BBB, LAFB, bifascicular block, LVH with QRS widening and repolarization abnormality. He has had a bifascicular block since 03/2014.    12/30/13 Echo (Care Everywhere):  SUMMARY Global LV systolic function is preserved LV ejection fraction = 60-65%. There is moderate concentric left ventricular hypertrophy. Left ventricular filling pattern is impaired. Biatrial enlargement consistent with the status post heart transplant. Mild aortic regurgitation. For the aortic valve, the right coronary cusp appears calcified with significantly restricted motion, but from what is seen of the remaining cusps, they appear to open well. Mild aortic stenosis. There is mild mitral regurgitation. There is mild mitral valve thickening. There is no pericardial effusion. No significant change in comparison with the prior study noted.  10/14/13 Cardiac cath (Care Everywhere): CONCLUSIONS: CORONARY STATUS: Moderate non-obstructive ASCAD. Left Main Disease. OTHER:  LM- 40% distal LAD and Ramus - mild non-obstructve LCx - 50% proximal RCA - 50% proximal(in-stent); 30% mid; 30% distal   1V CXR 08/25/14: No active disease.  05/31/14 Chest CT w/o contrast: IMPRESSION: 1. Interval improved aeration of both lungs compatible with resolving pneumonia. 2. Emphysema 3. Cardiac enlargement. Previous CABG procedure.  He is for labs on arrival including ISTAT and PT/PTT. I also added PLT count due to his history of thrombocytopenia (PLT count range 71-92K since 08/26/14).   Reviewed chart with anesthesiologist Dr. Marcie Bal. He has had recent transplant cardiology follow-up.  He is scheduled for routine cardiac testing, but by recent notes did not appear to be having any acute issues other than his recent DVT.  His PCP is managing pre-operative Lovenox while off Coumadin.  If same day labs are acceptable and no acute CV/CHF issues then it is anticipated that he can proceed with HD access procedure.  Further evaluation by his assigned anesthesiologist on the day  of surgery.  George Hugh [facility]/Anesthesiology Phone [phone] 10/19/2014 2:50 PM

## 2014-10-19 NOTE — Progress Notes (Signed)
Pt denies SOB and chest pain but is under the care of Dr. Kirtland Bouchard, cardiology at [facility]. Pt stated that his last dose of Coumadin was Sat 5/21, he was instructed to " start my Lovenox after surgery when my labs are 2.0 and I have to take half of my insulin in the morning, not the Lantus, the other one." [place], [place] (anesthesia) reviewed pt history ( see note).

## 2014-10-20 ENCOUNTER — Observation Stay (HOSPITAL_COMMUNITY)
Admission: RE | Admit: 2014-10-20 | Discharge: 2014-10-21 | Disposition: A | Discharge status: Home / Self Care | Payer: Medicare Other | Source: Ambulatory Visit | Attending: Vascular Surgery | Admitting: Vascular Surgery

## 2014-10-20 ENCOUNTER — Encounter (HOSPITAL_COMMUNITY)
Admission: RE | Disposition: A | Discharge status: Home / Self Care | Payer: Self-pay | Source: Ambulatory Visit | Attending: Vascular Surgery

## 2014-10-20 ENCOUNTER — Inpatient Hospital Stay (HOSPITAL_COMMUNITY): Payer: Medicare Other | Admitting: Vascular Surgery

## 2014-10-20 ENCOUNTER — Encounter (HOSPITAL_COMMUNITY): Payer: Self-pay | Admitting: *Deleted

## 2014-10-20 DIAGNOSIS — Z87891 Personal history of nicotine dependence: Secondary | ICD-10-CM | POA: Insufficient documentation

## 2014-10-20 DIAGNOSIS — F1099 Alcohol use, unspecified with unspecified alcohol-induced disorder: Secondary | ICD-10-CM | POA: Diagnosis not present

## 2014-10-20 DIAGNOSIS — Z8701 Personal history of pneumonia (recurrent): Secondary | ICD-10-CM | POA: Insufficient documentation

## 2014-10-20 DIAGNOSIS — Z992 Dependence on renal dialysis: Secondary | ICD-10-CM | POA: Diagnosis not present

## 2014-10-20 DIAGNOSIS — I120 Hypertensive chronic kidney disease with stage 5 chronic kidney disease or end stage renal disease: Secondary | ICD-10-CM | POA: Diagnosis not present

## 2014-10-20 DIAGNOSIS — Z86718 Personal history of other venous thrombosis and embolism: Secondary | ICD-10-CM | POA: Diagnosis not present

## 2014-10-20 DIAGNOSIS — I739 Peripheral vascular disease, unspecified: Secondary | ICD-10-CM | POA: Diagnosis not present

## 2014-10-20 DIAGNOSIS — Z95810 Presence of automatic (implantable) cardiac defibrillator: Secondary | ICD-10-CM | POA: Diagnosis not present

## 2014-10-20 DIAGNOSIS — Z941 Heart transplant status: Secondary | ICD-10-CM | POA: Insufficient documentation

## 2014-10-20 DIAGNOSIS — Z7901 Long term (current) use of anticoagulants: Secondary | ICD-10-CM | POA: Diagnosis not present

## 2014-10-20 DIAGNOSIS — Z951 Presence of aortocoronary bypass graft: Secondary | ICD-10-CM | POA: Insufficient documentation

## 2014-10-20 DIAGNOSIS — Z794 Long term (current) use of insulin: Secondary | ICD-10-CM | POA: Insufficient documentation

## 2014-10-20 DIAGNOSIS — I2510 Atherosclerotic heart disease of native coronary artery without angina pectoris: Secondary | ICD-10-CM | POA: Diagnosis not present

## 2014-10-20 DIAGNOSIS — Z905 Acquired absence of kidney: Secondary | ICD-10-CM | POA: Diagnosis not present

## 2014-10-20 DIAGNOSIS — Z86010 Personal history of colonic polyps: Secondary | ICD-10-CM | POA: Insufficient documentation

## 2014-10-20 DIAGNOSIS — I252 Old myocardial infarction: Secondary | ICD-10-CM | POA: Insufficient documentation

## 2014-10-20 DIAGNOSIS — N186 End stage renal disease: Secondary | ICD-10-CM | POA: Diagnosis not present

## 2014-10-20 DIAGNOSIS — Z888 Allergy status to other drugs, medicaments and biological substances status: Secondary | ICD-10-CM | POA: Insufficient documentation

## 2014-10-20 DIAGNOSIS — Z880 Allergy status to penicillin: Secondary | ICD-10-CM | POA: Diagnosis not present

## 2014-10-20 DIAGNOSIS — Z79899 Other long term (current) drug therapy: Secondary | ICD-10-CM | POA: Diagnosis not present

## 2014-10-20 DIAGNOSIS — I509 Heart failure, unspecified: Secondary | ICD-10-CM | POA: Diagnosis not present

## 2014-10-20 DIAGNOSIS — E1129 Type 2 diabetes mellitus with other diabetic kidney complication: Secondary | ICD-10-CM | POA: Insufficient documentation

## 2014-10-20 DIAGNOSIS — Z955 Presence of coronary angioplasty implant and graft: Secondary | ICD-10-CM | POA: Insufficient documentation

## 2014-10-20 HISTORY — PX: AV FISTULA PLACEMENT: SHX1204

## 2014-10-20 LAB — GLUCOSE, CAPILLARY
GLUCOSE-CAPILLARY: 126 mg/dL — AB (ref 65–99)
Glucose-Capillary: 92 mg/dL (ref 65–99)
Glucose-Capillary: 99 mg/dL (ref 65–99)

## 2014-10-20 LAB — PROTIME-INR
INR: 1.23 (ref 0.00–1.49)
PROTHROMBIN TIME: 15.6 s — AB (ref 11.6–15.2)

## 2014-10-20 LAB — PLATELET COUNT: Platelets: 111 10*3/uL — ABNORMAL LOW (ref 150–400)

## 2014-10-20 LAB — POCT I-STAT 4, (NA,K, GLUC, HGB,HCT)
Glucose, Bld: 117 mg/dL — ABNORMAL HIGH (ref 65–99)
HCT: 26 % — ABNORMAL LOW (ref 39.0–52.0)
HEMOGLOBIN: 8.8 g/dL — AB (ref 13.0–17.0)
POTASSIUM: 3.8 mmol/L (ref 3.5–5.1)
Sodium: 137 mmol/L (ref 135–145)

## 2014-10-20 LAB — APTT: APTT: 25 s (ref 24–37)

## 2014-10-20 SURGERY — INSERTION OF ARTERIOVENOUS (AV) GORE-TEX GRAFT THIGH
Anesthesia: General | Site: Leg Upper | Laterality: Right

## 2014-10-20 MED ORDER — PROPOFOL 10 MG/ML IV BOLUS
INTRAVENOUS | Status: AC
  Filled 2014-10-20: qty 20

## 2014-10-20 MED ORDER — METOPROLOL TARTRATE 1 MG/ML IV SOLN: 2.0000 mg | INTRAVENOUS | Status: DC | PRN

## 2014-10-20 MED ORDER — PHENOL 1.4 % MT LIQD
1.0000 | OROMUCOSAL | Status: DC | PRN
  Filled 2014-10-20 (×2): qty 177

## 2014-10-20 MED ORDER — CARVEDILOL 6.25 MG PO TABS
6.2500 mg | ORAL_TABLET | Freq: Every day | ORAL | Status: DC
  Administered 2014-10-20: 6.25 mg via ORAL
  Filled 2014-10-20 (×2): qty 1

## 2014-10-20 MED ORDER — CHLORHEXIDINE GLUCONATE CLOTH 2 % EX PADS: 6.0000 | MEDICATED_PAD | Freq: Once | CUTANEOUS | Status: DC

## 2014-10-20 MED ORDER — COLESEVELAM HCL 625 MG PO TABS
3750.0000 mg | ORAL_TABLET | Freq: Every day | ORAL | Status: DC
  Administered 2014-10-21: 3750 mg via ORAL
  Filled 2014-10-20 (×2): qty 6

## 2014-10-20 MED ORDER — DOCUSATE SODIUM 100 MG PO CAPS
100.0000 mg | ORAL_CAPSULE | Freq: Two times a day (BID) | ORAL | Status: DC
  Filled 2014-10-20 (×2): qty 1

## 2014-10-20 MED ORDER — FENTANYL CITRATE (PF) 100 MCG/2ML IJ SOLN
INTRAMUSCULAR | Status: DC | PRN
  Administered 2014-10-20 (×5): 50 ug via INTRAVENOUS

## 2014-10-20 MED ORDER — SODIUM CHLORIDE 0.9 % IR SOLN
Status: DC | PRN
  Administered 2014-10-20: 500 mL

## 2014-10-20 MED ORDER — BISACODYL 10 MG RE SUPP: 10.0000 mg | Freq: Every day | RECTAL | Status: DC | PRN

## 2014-10-20 MED ORDER — SODIUM CHLORIDE 0.9 % IV SOLN
INTRAVENOUS | Status: DC
  Administered 2014-10-20: 16:00:00 via INTRAVENOUS

## 2014-10-20 MED ORDER — FAMOTIDINE 10 MG PO TABS
10.0000 mg | ORAL_TABLET | Freq: Every day | ORAL | Status: DC
  Administered 2014-10-20: 10 mg via ORAL
  Filled 2014-10-20 (×2): qty 1

## 2014-10-20 MED ORDER — FENTANYL CITRATE (PF) 250 MCG/5ML IJ SOLN
INTRAMUSCULAR | Status: AC
  Filled 2014-10-20: qty 5

## 2014-10-20 MED ORDER — CALCIUM ACETATE (PHOS BINDER) 667 MG PO CAPS
667.0000 mg | ORAL_CAPSULE | Freq: Three times a day (TID) | ORAL | Status: DC
  Administered 2014-10-20 – 2014-10-21 (×2): 667 mg via ORAL
  Filled 2014-10-20 (×5): qty 1

## 2014-10-20 MED ORDER — ONDANSETRON HCL 4 MG/2ML IJ SOLN: 4.0000 mg | Freq: Four times a day (QID) | INTRAMUSCULAR | Status: DC | PRN

## 2014-10-20 MED ORDER — INSULIN ASPART 100 UNIT/ML ~~LOC~~ SOLN: 0.0000 [IU] | Freq: Three times a day (TID) | SUBCUTANEOUS | Status: DC

## 2014-10-20 MED ORDER — SENNOSIDES-DOCUSATE SODIUM 8.6-50 MG PO TABS
1.0000 | ORAL_TABLET | Freq: Every evening | ORAL | Status: DC | PRN
  Filled 2014-10-20: qty 1

## 2014-10-20 MED ORDER — ARTIFICIAL TEARS OP OINT
TOPICAL_OINTMENT | OPHTHALMIC | Status: DC | PRN
  Administered 2014-10-20: 1 via OPHTHALMIC

## 2014-10-20 MED ORDER — 0.9 % SODIUM CHLORIDE (POUR BTL) OPTIME
TOPICAL | Status: DC | PRN
  Administered 2014-10-20: 1000 mL

## 2014-10-20 MED ORDER — HEPARIN SODIUM (PORCINE) 1000 UNIT/ML IJ SOLN
INTRAMUSCULAR | Status: DC | PRN
  Administered 2014-10-20: 6000 [IU] via INTRAVENOUS

## 2014-10-20 MED ORDER — OXYCODONE HCL 5 MG PO TABS
5.0000 mg | ORAL_TABLET | ORAL | Status: DC | PRN
  Administered 2014-10-20 (×2): 10 mg via ORAL
  Filled 2014-10-20 (×2): qty 2

## 2014-10-20 MED ORDER — INSULIN GLARGINE 100 UNIT/ML ~~LOC~~ SOLN
20.0000 [IU] | Freq: Every morning | SUBCUTANEOUS | Status: DC
  Filled 2014-10-20: qty 0.2

## 2014-10-20 MED ORDER — ONDANSETRON HCL 4 MG/2ML IJ SOLN: 4.0000 mg | Freq: Once | INTRAMUSCULAR | Status: DC | PRN

## 2014-10-20 MED ORDER — PREDNISONE 5 MG PO TABS
5.0000 mg | ORAL_TABLET | Freq: Every day | ORAL | Status: DC
  Administered 2014-10-21: 5 mg via ORAL
  Filled 2014-10-20 (×2): qty 1

## 2014-10-20 MED ORDER — ATORVASTATIN CALCIUM 40 MG PO TABS
40.0000 mg | ORAL_TABLET | Freq: Every day | ORAL | Status: DC
  Administered 2014-10-20: 40 mg via ORAL
  Filled 2014-10-20 (×2): qty 1

## 2014-10-20 MED ORDER — INSULIN LISPRO 100 UNIT/ML (KWIKPEN)
4.0000 [IU] | PEN_INJECTOR | Freq: Two times a day (BID) | SUBCUTANEOUS | Status: DC
Start: 2014-10-20 — End: 2014-10-20

## 2014-10-20 MED ORDER — THROMBIN 20000 UNITS EX SOLR
CUTANEOUS | Status: DC | PRN
  Administered 2014-10-20: 20 mL via TOPICAL

## 2014-10-20 MED ORDER — MIDAZOLAM HCL 2 MG/2ML IJ SOLN
INTRAMUSCULAR | Status: AC
  Filled 2014-10-20: qty 2

## 2014-10-20 MED ORDER — LIDOCAINE HCL (CARDIAC) 20 MG/ML IV SOLN
INTRAVENOUS | Status: DC | PRN
  Administered 2014-10-20: 40 mg via INTRAVENOUS

## 2014-10-20 MED ORDER — PROTAMINE SULFATE 10 MG/ML IV SOLN
INTRAVENOUS | Status: DC | PRN
  Administered 2014-10-20 (×3): 10 mg via INTRAVENOUS

## 2014-10-20 MED ORDER — PROPOFOL 10 MG/ML IV BOLUS
INTRAVENOUS | Status: DC | PRN
  Administered 2014-10-20: 150 mg via INTRAVENOUS

## 2014-10-20 MED ORDER — ONDANSETRON HCL 4 MG/2ML IJ SOLN
INTRAMUSCULAR | Status: AC
  Filled 2014-10-20: qty 2

## 2014-10-20 MED ORDER — RENA-VITE PO TABS
1.0000 | ORAL_TABLET | Freq: Every day | ORAL | Status: DC
  Administered 2014-10-20: 1 via ORAL
  Filled 2014-10-20 (×2): qty 1

## 2014-10-20 MED ORDER — ISOSORBIDE DINITRATE ER 40 MG PO TBCR
40.0000 mg | EXTENDED_RELEASE_TABLET | Freq: Three times a day (TID) | ORAL | Status: DC
  Administered 2014-10-20: 40 mg via ORAL
  Filled 2014-10-20 (×8): qty 1

## 2014-10-20 MED ORDER — AZATHIOPRINE 50 MG PO TABS
75.0000 mg | ORAL_TABLET | Freq: Every morning | ORAL | Status: DC
  Filled 2014-10-20: qty 2

## 2014-10-20 MED ORDER — ACETAMINOPHEN 325 MG PO TABS: 325.0000 mg | ORAL_TABLET | ORAL | Status: DC | PRN

## 2014-10-20 MED ORDER — PHENYLEPHRINE HCL 10 MG/ML IJ SOLN
INTRAMUSCULAR | Status: DC | PRN
  Administered 2014-10-20 (×2): 80 ug via INTRAVENOUS

## 2014-10-20 MED ORDER — FAMOTIDINE 10 MG PO CHEW: 10.0000 mg | CHEWABLE_TABLET | Freq: Every day | ORAL | Status: DC

## 2014-10-20 MED ORDER — MORPHINE SULFATE 2 MG/ML IJ SOLN: 2.0000 mg | INTRAMUSCULAR | Status: DC | PRN

## 2014-10-20 MED ORDER — HEPARIN (PORCINE) IN NACL 100-0.45 UNIT/ML-% IJ SOLN
1000.0000 [IU]/h | INTRAMUSCULAR | Status: DC
  Administered 2014-10-20: 1000 [IU]/h via INTRAVENOUS
  Filled 2014-10-20 (×2): qty 250

## 2014-10-20 MED ORDER — HYDRALAZINE HCL 20 MG/ML IJ SOLN: 5.0000 mg | INTRAMUSCULAR | Status: DC | PRN

## 2014-10-20 MED ORDER — LABETALOL HCL 5 MG/ML IV SOLN
10.0000 mg | INTRAVENOUS | Status: DC | PRN
  Filled 2014-10-20: qty 4

## 2014-10-20 MED ORDER — LIDOCAINE HCL (CARDIAC) 20 MG/ML IV SOLN
INTRAVENOUS | Status: AC
  Filled 2014-10-20: qty 5

## 2014-10-20 MED ORDER — SIROLIMUS 1 MG PO TABS
1.5000 mg | ORAL_TABLET | Freq: Every day | ORAL | Status: DC
  Filled 2014-10-20: qty 2

## 2014-10-20 MED ORDER — ONDANSETRON HCL 4 MG/2ML IJ SOLN
INTRAMUSCULAR | Status: DC | PRN
  Administered 2014-10-20: 4 mg via INTRAVENOUS

## 2014-10-20 MED ORDER — CALCIUM ACETATE 667 MG PO CAPS: 667.0000 mg | ORAL_CAPSULE | Freq: Three times a day (TID) | ORAL | Status: DC

## 2014-10-20 MED ORDER — SIROLIMUS 0.5 MG PO TABS: 3.0000 | ORAL_TABLET | Freq: Every day | ORAL | Status: DC

## 2014-10-20 MED ORDER — FENTANYL CITRATE (PF) 100 MCG/2ML IJ SOLN: 25.0000 ug | INTRAMUSCULAR | Status: DC | PRN

## 2014-10-20 MED ORDER — SODIUM CHLORIDE 0.9 % IV SOLN
10.0000 mg | INTRAVENOUS | Status: DC | PRN
  Administered 2014-10-20: 10 ug/min via INTRAVENOUS

## 2014-10-20 MED ORDER — ROCURONIUM BROMIDE 50 MG/5ML IV SOLN
INTRAVENOUS | Status: AC
  Filled 2014-10-20: qty 1

## 2014-10-20 MED ORDER — GUAIFENESIN-DM 100-10 MG/5ML PO SYRP: 15.0000 mL | ORAL_SOLUTION | ORAL | Status: DC | PRN

## 2014-10-20 MED ORDER — MIDAZOLAM HCL 5 MG/5ML IJ SOLN
INTRAMUSCULAR | Status: DC | PRN
  Administered 2014-10-20: 2 mg via INTRAVENOUS

## 2014-10-20 MED ORDER — ACETAMINOPHEN 650 MG RE SUPP: 325.0000 mg | RECTAL | Status: DC | PRN

## 2014-10-20 MED ORDER — THROMBIN 20000 UNITS EX SOLR
CUTANEOUS | Status: AC
  Filled 2014-10-20: qty 20000

## 2014-10-20 SURGICAL SUPPLY — 36 items
CANISTER SUCTION 2500CC (MISCELLANEOUS) ×3 IMPLANT
CLIP TI MEDIUM 6 (CLIP) ×3 IMPLANT
CLIP TI WIDE RED SMALL 6 (CLIP) ×3 IMPLANT
DRAPE INCISE IOBAN 66X45 STRL (DRAPES) ×6 IMPLANT
DRAPE ORTHO SPLIT 77X108 STRL (DRAPES) ×3
DRAPE SURG ORHT 6 SPLT 77X108 (DRAPES) IMPLANT
ELECT REM PT RETURN 9FT ADLT (ELECTROSURGICAL) ×3
ELECTRODE REM PT RTRN 9FT ADLT (ELECTROSURGICAL) ×1 IMPLANT
GLOVE BIO SURGEON STRL SZ 6.5 (GLOVE) ×3 IMPLANT
GLOVE BIO SURGEON STRL SZ7 (GLOVE) ×5 IMPLANT
GLOVE BIO SURGEONS STRL SZ 6.5 (GLOVE) ×3
GLOVE BIOGEL PI IND STRL 6.5 (GLOVE) IMPLANT
GLOVE BIOGEL PI IND STRL 7.5 (GLOVE) ×1 IMPLANT
GLOVE BIOGEL PI INDICATOR 6.5 (GLOVE) ×6
GLOVE BIOGEL PI INDICATOR 7.5 (GLOVE) ×2
GLOVE SURG SS PI 7.0 STRL IVOR (GLOVE) ×2 IMPLANT
GOWN STRL REUS W/ TWL LRG LVL3 (GOWN DISPOSABLE) ×3 IMPLANT
GOWN STRL REUS W/TWL LRG LVL3 (GOWN DISPOSABLE) ×9
GRAFT PROPATEN THIN WALL 6X80 (Vascular Products) ×2 IMPLANT
KIT BASIN OR (CUSTOM PROCEDURE TRAY) ×3 IMPLANT
KIT ROOM TURNOVER OR (KITS) ×3 IMPLANT
LIQUID BAND (GAUZE/BANDAGES/DRESSINGS) ×3 IMPLANT
LOOP VESSEL MAXI BLUE (MISCELLANEOUS) ×2 IMPLANT
NS IRRIG 1000ML POUR BTL (IV SOLUTION) ×3 IMPLANT
PACK CV ACCESS (CUSTOM PROCEDURE TRAY) ×3 IMPLANT
PAD ARMBOARD 7.5X6 YLW CONV (MISCELLANEOUS) ×6 IMPLANT
SPONGE SURGIFOAM ABS GEL 100 (HEMOSTASIS) ×2 IMPLANT
SUT GORETEX 5 0 TT13 24 (SUTURE) ×6 IMPLANT
SUT MNCRL AB 4-0 PS2 18 (SUTURE) ×5 IMPLANT
SUT PROLENE 5 0 C 1 24 (SUTURE) ×4 IMPLANT
SUT PROLENE 6 0 BV (SUTURE) ×2 IMPLANT
SUT VIC AB 2-0 CT1 27 (SUTURE) ×3
SUT VIC AB 2-0 CT1 TAPERPNT 27 (SUTURE) ×1 IMPLANT
SUT VIC AB 3-0 SH 27 (SUTURE) ×12
SUT VIC AB 3-0 SH 27X BRD (SUTURE) ×2 IMPLANT
WATER STERILE IRR 1000ML POUR (IV SOLUTION) ×3 IMPLANT

## 2014-10-20 NOTE — Anesthesia Preprocedure Evaluation (Signed)
Anesthesia Evaluation  Patient identified by MRN, date of birth, ID band Patient awake    Reviewed: Allergy & Precautions, NPO status , Patient's Chart, lab work & pertinent test results  Airway Mallampati: II  TM Distance: >3 FB Neck ROM: Full    Dental  (+) Teeth Intact   Pulmonary former smoker,  breath sounds clear to auscultation        Cardiovascular hypertension, Rhythm:Regular     Neuro/Psych    GI/Hepatic   Endo/Other  diabetes  Renal/GU      Musculoskeletal   Abdominal   Peds  Hematology   Anesthesia Other Findings   Reproductive/Obstetrics                             Anesthesia Physical Anesthesia Plan  ASA: III  Anesthesia Plan: General   Post-op Pain Management:    Induction: Intravenous  Airway Management Planned: LMA  Additional Equipment:   Intra-op Plan:   Post-operative Plan:   Informed Consent: I have reviewed the patients History and Physical, chart, labs and discussed the procedure including the risks, benefits and alternatives for the proposed anesthesia with the patient or authorized representative who has indicated his/her understanding and acceptance.   Dental advisory given  Plan Discussed with: CRNA and Anesthesiologist  Anesthesia Plan Comments:         Anesthesia Quick Evaluation

## 2014-10-20 NOTE — Op Note (Signed)
OPERATIVE NOTE   PROCEDURE:  right thigh arteriovenous graft  PRE-OPERATIVE DIAGNOSIS: end stage renal disease   POST-OPERATIVE DIAGNOSIS: same as above   SURGEON: Adele Barthel, MD  ASSISTANT(S): Gerri Lins, PAC   ANESTHESIA: general  ESTIMATED BLOOD LOSS: 50 cc  FINDING(S): 1. Inflamed subcutaneous tissue surrounding common femoral vein and artery 2. Diseased common femoral artery with soft spot laterally in the artery 3. Calcified superficial femoral artery  4. Palpable thrill at end of case 5. Dopplerable right dorsalis pedis artery at end of case  SPECIMEN(S):  none  INDICATIONS:   Brandon Sharp is a 69 y.o. male who presents with end stage renal disease.  Unfortunately, due to previously placed central vein stents, he no longer has access options in his arms.  The patient was scheduled for a right thigh arteriovenous graft.  Risk, benefits, and alternatives to thigh arteriovenous graft placement were discussed.  The patient is aware the risks include but are not limited to: bleeding, infection, steal syndrome, nerve damage, ischemic monomelic neuropathy, failure to mature, possible development of critical limb ischemia due to steal syndrome and need for additional procedures.  The patient is aware that steal syndrome in the legs can present as limb ischemia resulting in gangrene.  The patient is aware of the risks and elects to proceed forward.  DESCRIPTION: After full informed written consent was obtained from the patient, the patient was brought back to the operating room and placed supine upon the operating table.  The patient was given IV antibiotics prior to proceeding.  After obtaining adequate sedation, the patient was prepped and draped in standard fashion for a right thigh arteriovenous graft placement.  I made an oblique incision over the common femoral artery one finger width below the inguinal ligament.  I dissected down through the subcutaneous tissue until  I had access to the common femoral artery and common femoral vein.  This was substantially more difficult than usual due to inflamed subcutaneous tissue overlying the common femoral artery and vein.  I had to careful dissect the tissue off these vessels to get access to them.  The common femoral artery appeared to be 5 mm externally and the common femoral vein appeared to be 10 mm externally.  I obtained a 6 mm Propaten graft and stretched it to full length.  I tend determined where the apex of the loop of the graft would be located.  I then placed transverse incisions on the lateral and medial thigh and dissected a small pocket in the subcutaneous tissue at both locations.   Then using a Gore tunneler I tunneled from the groin to the lateral thigh incision and delivered the graft.  I then tunneled from lateral thigh incision to medial thigh incision and delivered the graft.  Finally, I tunneled from medial thigh incision to groin and finished delivering the graft.  The orientation of the graft was maintained throughout this process.  I then gave the patient 6000 units of heparin to gain some limited anticoagulation.  After waiting 3 minutes, I then clamped the common femoral artery proximally and distally.  The distal artery could not be fully clamped due to calcification, so I dissected out the right superficial femoral artery and profunda femoral artery.  I placed these two under vessel loop traction to gain control of the bleeding.  I made a lateral incision in the common femoral artery as this was the only soft spot in the common femoral artery.  I extended  the arteriotomy with a Potts scissor.  I then spatulate the graft to meet the dimensions of the artery and sewed to the artery in an end-to-side configuration with running stitch of 5-0 Prolene.  I clamped the graft near its arterial anastomosis and released the clamps on the common femoral artery.  I then pulled the graft to appropriate tension throughout  the tunnel.  I then clamped the common femoral vein proximally and distally and made a venotomy, which I extended with a Pott's scissor.  The graft was then spatulated to meet the dimensions of the venotomy.  The graft was sewn to the vein in an end-to-side configuration with a running stitch of 5-0 Prolene.  Prior to completion this anastomosis, I released all clamps on the graft and vein, allowing the graft and vein to backbleed before completing the anastomosis.  There was excellent graft bleeding.  There was excellent backbleeding from the vein.  The anastomosis was completed in the usual fashion.  I placed thrombin and Gelfoam in the wound.  After waiting, a few minutes, I washed out the wound and there was no further active bleeding.  The common femoral artery was examined which demonstrated: palpable pulse.  The venous outflow was also examined, which demonstrated: palpable thrill.  The groin was repaired with a double layer of 2-0 Vicryl, a double layer of 3-0 Vicryl, and a running subcuticular of 4-0 Monocryl.  All other incisions were repaired with a layer of 3-0 Vicryl and 4-0 Monocryl at the skin level.  The skin was then cleaned, dried, and Dermabond used to reinforce the skin closure at all incisions.   Distally the dorsalis pedis artery had an intact doppler signal.  COMPLICATIONS: none  CONDITION: stable   Adele Barthel, MD [facility] [place] Office: [phone] Pager: [phone]  10/20/2014, 2:03 PM

## 2014-10-20 NOTE — Transfer of Care (Signed)
Immediate Anesthesia Transfer of Care Note  Patient: Brandon Sharp  Procedure(s) Performed: Procedure(s): INSERTION OF ARTERIOVENOUS (AV) GORE-TEX GRAFT THIGH (Right)  Patient Location: PACU  Anesthesia Type:General  Level of Consciousness: awake, alert , oriented and patient cooperative  Airway & Oxygen Therapy: Patient Spontanous Breathing and Patient connected to face mask oxygen  Post-op Assessment: Report given to RN, Post -op Vital signs reviewed and stable and Patient moving all extremities  Post vital signs: Reviewed and stable  Last Vitals:  Filed Vitals:   10/20/14 0800  BP: 147/62  Pulse: 95  Temp: 36.4 C  Resp: 18    Complications: No apparent anesthesia complications

## 2014-10-20 NOTE — Anesthesia Postprocedure Evaluation (Signed)
  Anesthesia Post-op Note  Patient: Brandon Sharp  Procedure(s) Performed: Procedure(s): INSERTION OF ARTERIOVENOUS (AV) GORE-TEX GRAFT THIGH (Right)  Patient Location: PACU  Anesthesia Type:General  Level of Consciousness: awake, alert  and oriented  Airway and Oxygen Therapy: Patient Spontanous Breathing and Patient connected to nasal cannula oxygen  Post-op Pain: mild  Post-op Assessment: Post-op Vital signs reviewed, Patient's Cardiovascular Status Stable, Respiratory Function Stable, Patent Airway, No signs of Nausea or vomiting and Pain level controlled  Post-op Vital Signs: stable  Last Vitals:  Filed Vitals:   10/20/14 1500  BP: 116/52  Pulse: 88  Temp: 36.4 C  Resp: 16    Complications: No apparent anesthesia complications

## 2014-10-20 NOTE — Anesthesia Procedure Notes (Signed)
Procedure Name: LMA Insertion Date/Time: 10/20/2014 11:09 AM Performed by: Rogers Blocker Pre-anesthesia Checklist: Patient identified, Emergency Drugs available, Suction available, Patient being monitored and Timeout performed Patient Re-evaluated:Patient Re-evaluated prior to inductionOxygen Delivery Method: Circle system utilized Preoxygenation: Pre-oxygenation with 100% oxygen Intubation Type: IV induction Ventilation: Mask ventilation without difficulty LMA: LMA inserted LMA Size: 4.5 Number of attempts: 1 Tube secured with: Tape Dental Injury: Teeth and Oropharynx as per pre-operative assessment

## 2014-10-20 NOTE — Interval H&P Note (Signed)
[facility] [place]  History and Physical Update  The patient was interviewed and re-examined.  The patient's previous History and Physical has been reviewed and is unchanged from Dr. Kellie Simmering consult.  There is no change in the plan of care: R thigh AVG.  Risk, benefits, and alternatives to access surgery were discussed.  The patient is aware the risks include but are not limited to: bleeding, infection, steal syndrome, nerve damage, ischemic monomelic neuropathy, failure to mature, need for additional procedures, death and stroke.  The patient is aware that steal manifest in the lower extremity as gangrene.  The patient agrees to proceed forward with the procedure. Adele Barthel, MD [facility] [place] Office: [phone] Pager: [phone]  10/20/2014, 8:26 AM

## 2014-10-20 NOTE — H&P (View-Only) (Signed)
Brief History and Physical  History of Present Illness  Brandon Sharp is a 69 y.o. male who presents with chief complaint: non functioning left and now right upper extremity AV fistula.  1 Week ago his right BC fistula stopped functioning and a left IJ catheter was placed by Dr. Augustin Coupe.   He has a history of venous stenosis and DVT in the right jugular vein 04/2015.  He has no access options in either upper extremity at this time.   Of note, he is on chronic immunosuppressive therapy as he has undergone a heart transplant in 2001. 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  He does have symptoms of claudication in both lower extremities equally in the thighs and calves.  He denies history of ulcer or infections in bilateral LE.  Other past medical history includes DM Type I on insulin, coumadin therapy for DVT in his upper extremity times 2.  He is on dialysis MWF.  Past Medical History  Diagnosis Date  . Diabetes mellitus   . Hypertension   . Myocardial infarction 1985; 1990  . Angina   . Coronary artery disease   . Dysrhythmia   . DVT (deep venous thrombosis) ~ 12/2010    LLE  . Anemia   . Blood transfusion 06/1999    post heart transplant  . Peripheral vascular disease   . CHF (congestive heart failure) 2001    beffore transplant  . Renal failure     Hemodialysis MWF last 2 years, sse dr [name] nephrology, goes to [facility]  . DVT (deep venous thrombosis) 08/25/2014    Nearly occluded R IJ, and L subclavian DVT    Past Surgical History  Procedure Laterality Date  . Nephrectomy transplanted organ  2008  . Av fistula repair      rt. a=fore arm fistula  . Av fistula placement  ~ 01/2010    "this was my 2nd fistula placement"  . Heart transplant  06/1999  . Coronary angioplasty with stent placement  1985; 1990  . Colonoscopy  03/17/2012    Procedure: COLONOSCOPY;  Surgeon: Lear Ng, MD;  Location: [facility];  Service:  Endoscopy;  Laterality: N/A;  . Coronary artery bypass graft  1990    CABG X3  . Colonoscopy with propofol N/A 12/14/2013    Procedure: COLONOSCOPY WITH PROPOFOL;  Surgeon: Lear Ng, MD;  Location: [facility];  Service: Endoscopy;  Laterality: N/A;  . Video bronchoscopy Bilateral 04/14/2014    Procedure: VIDEO BRONCHOSCOPY WITH FLUORO;  Surgeon: Collene Gobble, MD;  Location: [place];  Service: Cardiopulmonary;  Laterality: Bilateral;  . Shuntogram Right 05/09/2014    Procedure: FISTULOGRAM;  Surgeon: Conrad Palos [place], MD;  Location: [facility] CATH LAB;  Service: Cardiovascular;  Laterality: Right;    History   Social History  . Marital Status: Married    Spouse Name: N/A  . Number of Children: N/A  . Years of Education: N/A   Occupational History  . Not on file.   Social History Main Topics  . Smoking status: Former Smoker -- 1.00 packs/day for 20 years    Types: Cigarettes    Quit date: 05/27/1988  . Smokeless tobacco: Never Used  . Alcohol Use: Yes     Comment: "Holidays"  . Drug Use: No  . Sexual Activity: Not on file   Other Topics Concern  . Not on file   Social History Narrative    Family  History  Problem Relation Age of Onset  . Heart disease Mother   . Hyperlipidemia Mother   . Hypertension Mother   . Diabetes Mother   . Hyperlipidemia Father   . Hypertension Father   . Heart disease Father   . Diabetes Brother   . Heart disease Brother     Heart Disease before age 36  . Hyperlipidemia Brother   . Hypertension Brother   . Heart attack Brother     Current Outpatient Prescriptions on File Prior to Visit  Medication Sig Dispense Refill  . atorvastatin (LIPITOR) 40 MG tablet Take 40 mg by mouth daily.      Marland Kitchen azaTHIOprine (IMURAN) 50 MG tablet Take 75 mg by mouth every morning.     Marland Kitchen b complex-vitamin c-folic acid (NEPHRO-VITE) 0.8 MG TABS tablet Take 1 tablet by mouth at bedtime.    . calcium acetate (PHOSLO) 667 MG capsule Take 667 mg by mouth 3  (three) times daily with meals.     . carvedilol (COREG) 6.25 MG tablet Take 1 tablet (6.25 mg total) by mouth at bedtime. 30 tablet 0  . colesevelam (WELCHOL) 625 MG tablet Take 3,750 mg by mouth daily. Takes 6 tabs daily    . famotidine (PEPCID AC) 10 MG chewable tablet Chew 10 mg by mouth at bedtime.     Marland Kitchen HUMALOG KWIKPEN 100 UNIT/ML SOPN Inject 6-10 Units into the skin 2 (two) times daily. Per sliding scale    . insulin glargine (LANTUS) 100 UNIT/ML injection Inject 20 Units into the skin every morning.    . isosorbide dinitrate (ISORDIL) 40 MG tablet Take 40 mg by mouth 3 (three) times daily.      . predniSONE (DELTASONE) 5 MG tablet Take 1 tablet (5 mg total) by mouth daily with breakfast. 30 tablet 0  . sirolimus (RAPAMUNE) 2 MG tablet Take 1.5 mg by mouth daily.     Marland Kitchen warfarin (COUMADIN) 5 MG tablet Take 1 tablet (5 mg total) by mouth daily. 30 tablet 0  . [DISCONTINUED] doxazosin (CARDURA) 8 MG tablet Take 8 mg by mouth 2 (two) times daily.      . [DISCONTINUED] hydrALAZINE (APRESOLINE) 25 MG tablet Take 75 mg by mouth 3 (three) times daily.       No current facility-administered medications on file prior to visit.    Allergies  Allergen Reactions  . Lisinopril Swelling    Lips and tongue swell  . Niacin And Related Other (See Comments)    unknown  . Norvasc [Amlodipine Besylate] Rash    Flushing  . Penicillins Rash    Review of Systems: As listed above, otherwise negative.  Physical Examination  Filed Vitals:   10/11/14 1415  BP: 105/58  Pulse: 86  Height: 6' (1.829 m)  Weight: 161 lb (73.029 kg)  SpO2: 98%    General: A&O x 3, [place]  Pulmonary: Sym exp, good air movt, CTAB, no rales, rhonchi, & wheezing  Cardiac: RRR, Nl S1, S2, no Murmurs, rubs or gallops  Gastrointestinal: soft, NTND, -G/R, - HSM, - masses, - CVAT B  Musculoskeletal: M/S grossly equal 5/5 throughout , Extremities without ischemic changes   Vascular Palpable right femoral pulse 2-3 +, left  femoral pulse 1-2+   Laboratory See iStat  Vascular lab study 10/11/2014 Left upper extremity duplex shows thrombus left subclavian and axillary veins. Chronic DVT left subclavian stent.  Medical Decision Making  JABARI SWOVELAND is a 69 y.o. male who presents with: ESRD s/p failed  kidney transplant 2008.  He has had fistula creations in bilateral upper extremities that have clotted off and in-stent stenosis in right subclavian and innominate vein.   At this time his only option is a right thigh graft.  He has palpable femoral pulse.  We will schedule him for 10/20/2014 right thigh graft.  He will need to hold his coumadin for 4 days and bridge with Lovenox if his primary Dr. Melford Aase suggested it.  We prefer the INR to be 1.5 or less for the day of the procedure.  We also suggest he bring all his home medications to the hospital the morning of surgery.  He should take half his insulin dose that am because he will be NPO.     Theda Sers, EMMA [facility] PA-C [facility] [place] Office: [phone]  The patient was seen in conjunction with Dr. Kellie Simmering   10/11/2014, 2:50 PM   agree with above assessment  the disc of the procedure performed by Dr. Augustin Coupe  was reviewed and ultrasound in the office reveals chronic occlusion left subclavian axillary vein area where stent was placed many years ago. Left upper arm fistula has been occluded for 8 years and is not a candidate for hybrid graft    only good site the present time his right thigh graft which we will insert on Thursday May 26. Discussed with patient and he is in agreement

## 2014-10-20 NOTE — Progress Notes (Addendum)
ANTICOAGULATION CONSULT NOTE - Initial Consult  Pharmacy Consult for heparin Indication:   Allergies  Allergen Reactions  . Lisinopril Swelling    Lips and tongue swell  . Niacin And Related Other (See Comments)    unknown  . Norvasc [Amlodipine Besylate] Rash    Flushing  . Penicillins Rash    Patient Measurements: Height: 6' (182.9 cm) Weight: 159 lb (72.122 kg) IBW/kg (Calculated) : 77.6  Vital Signs: Temp: 97.6 F (36.4 C) (05/26 1500) Temp Source: Oral (05/26 1500) BP: 116/52 mmHg (05/26 1500) Pulse Rate: 88 (05/26 1500)  Labs:  Recent Labs  10/20/14 0835 10/20/14 0838  HGB  --  8.8*  HCT  --  26.0*  PLT 111*  --   APTT 25  --   LABPROT 15.6*  --   INR 1.23  --     CrCl cannot be calculated (Patient has no serum creatinine result on file.).   Medical History: Past Medical History  Diagnosis Date  . Diabetes mellitus   . Hypertension   . Myocardial infarction 1985; 1990  . Angina   . Dysrhythmia   . DVT (deep venous thrombosis) ~ 12/2010    LLE  . Anemia   . Blood transfusion 06/1999    post heart transplant  . Peripheral vascular disease   . Renal failure     Hemodialysis MWF last 2 years, sse dr [name] nephrology, goes to [place] [facility]  . DVT (deep venous thrombosis) 08/25/2014    Nearly occluded R IJ, and L subclavian DVT  . CHF (congestive heart failure) 2001    beffore transplant; had AICD that was explanted 07/19/99 following successful heart transplant  . Coronary artery disease     transplant cardiologist Dr. Corky Mull [facility])  . Pneumonia     Medications:  Prescriptions prior to admission  Medication Sig Dispense Refill Last Dose  . atorvastatin (LIPITOR) 40 MG tablet Take 40 mg by mouth daily.     10/19/2014 at Unknown time  . azaTHIOprine (IMURAN) 50 MG tablet Take 75 mg by mouth every morning.    10/20/2014 at Unknown time  . b complex-vitamin c-folic acid (NEPHRO-VITE) 0.8 MG TABS tablet Take 1 tablet by mouth  at bedtime.   10/19/2014 at Unknown time  . calcium acetate (PHOSLO) 667 MG capsule Take 667 mg by mouth 3 (three) times daily with meals.    10/19/2014 at Unknown time  . carvedilol (COREG) 6.25 MG tablet Take 1 tablet (6.25 mg total) by mouth at bedtime. 30 tablet 0 10/19/2014 at 2300  . colesevelam (WELCHOL) 625 MG tablet Take 3,750 mg by mouth daily. Takes 6 tabs daily   10/19/2014 at Unknown time  . famotidine (PEPCID AC) 10 MG chewable tablet Chew 10 mg by mouth at bedtime.    10/19/2014 at Unknown time  . HUMALOG KWIKPEN 100 UNIT/ML SOPN Inject 4-10 Units into the skin 2 (two) times daily. Per sliding scale   10/19/2014 at Unknown time  . insulin glargine (LANTUS) 100 UNIT/ML injection Inject 20 Units into the skin every morning.   10/19/2014 at Unknown time  . isosorbide dinitrate (ISOCHRON) 40 MG CR tablet Take 40 mg by mouth every 8 (eight) hours.  11 10/19/2014 at Unknown time  . predniSONE (DELTASONE) 5 MG tablet Take 1 tablet (5 mg total) by mouth daily with breakfast. 30 tablet 0 10/20/2014 at Unknown time  . Sirolimus 0.5 MG TABS Take 3 tablets by mouth daily.  11 10/20/2014 at Unknown time  .  warfarin (COUMADIN) 5 MG tablet Take 1 tablet (5 mg total) by mouth daily. 30 tablet 0 Past Week at Unknown time   Scheduled:  . atorvastatin  40 mg Oral Daily  . [START ON 10/21/2014] azaTHIOprine  75 mg Oral q morning - 10a  . calcium acetate  667 mg Oral TID WC  . carvedilol  6.25 mg Oral QHS  . colesevelam  3,750 mg Oral Daily  . docusate sodium  100 mg Oral BID  . famotidine  10 mg Oral QHS  . [START ON 10/21/2014] insulin glargine  20 Units Subcutaneous q morning - 10a  . insulin lispro  4-10 Units Subcutaneous BID  . isosorbide dinitrate  40 mg Oral 3 times per day  . multivitamin  1 tablet Oral QHS  . [START ON 10/21/2014] predniSONE  5 mg Oral Q breakfast  . Sirolimus  3 tablet Oral Daily    Assessment: 69 yo male with history of ESRD and s/p R thigh AVG. He is noted on coumadin PTA for  history of upper extremity DVT x2  (last noted 04/2015). Pharmacy consulted to begin heparin (start at 8pm with no bolus per MD request). INR today is 1.25, Hg/hct= 8.8/26, plt= 111  Coumadin dose PTA: 5mg /day (held prior to surgery)  Goal of Therapy:  Heparin level 0.3-0.7 units/ml Monitor platelets by anticoagulation protocol: Yes   Plan:  -No heparin bolus -Start heparin at 1000 units/hr at 8pm -Heparin level in 8 hrs and daily with CBC daily -Will follow plans to resume coumadin  Hildred Laser, Pharm D 10/20/2014 3:43 PM

## 2014-10-21 ENCOUNTER — Encounter (HOSPITAL_COMMUNITY): Payer: Self-pay | Admitting: Vascular Surgery

## 2014-10-21 ENCOUNTER — Telehealth: Payer: Self-pay | Admitting: Vascular Surgery

## 2014-10-21 DIAGNOSIS — I120 Hypertensive chronic kidney disease with stage 5 chronic kidney disease or end stage renal disease: Secondary | ICD-10-CM | POA: Diagnosis not present

## 2014-10-21 LAB — GLUCOSE, CAPILLARY
Glucose-Capillary: 109 mg/dL — ABNORMAL HIGH (ref 65–99)
Glucose-Capillary: 107 mg/dL — ABNORMAL HIGH (ref 65–99)

## 2014-10-21 LAB — CBC
HEMATOCRIT: 26.4 % — AB (ref 39.0–52.0)
Hemoglobin: 8.1 g/dL — ABNORMAL LOW (ref 13.0–17.0)
MCH: 29.6 pg (ref 26.0–34.0)
MCHC: 30.7 g/dL (ref 30.0–36.0)
MCV: 96.4 fL (ref 78.0–100.0)
Platelets: 93 10*3/uL — ABNORMAL LOW (ref 150–400)
RBC: 2.74 MIL/uL — ABNORMAL LOW (ref 4.22–5.81)
RDW: 17.9 % — ABNORMAL HIGH (ref 11.5–15.5)
WBC: 5.5 10*3/uL (ref 4.0–10.5)

## 2014-10-21 LAB — BASIC METABOLIC PANEL
Anion gap: 11 (ref 5–15)
BUN: 31 mg/dL — ABNORMAL HIGH (ref 6–20)
CALCIUM: 9 mg/dL (ref 8.9–10.3)
CO2: 26 mmol/L (ref 22–32)
Chloride: 99 mmol/L — ABNORMAL LOW (ref 101–111)
Creatinine, Ser: 7.17 mg/dL — ABNORMAL HIGH (ref 0.61–1.24)
GFR calc non Af Amer: 7 mL/min — ABNORMAL LOW (ref >60)
GFR, EST AFRICAN AMERICAN: 8 mL/min — AB (ref >60)
Glucose, Bld: 107 mg/dL — ABNORMAL HIGH (ref 65–99)
Potassium: 4.6 mmol/L (ref 3.5–5.1)
Sodium: 136 mmol/L (ref 135–145)

## 2014-10-21 LAB — HEPARIN LEVEL (UNFRACTIONATED): Heparin Unfractionated: 0.45 IU/mL (ref 0.30–0.70)

## 2014-10-21 MED ORDER — ENOXAPARIN SODIUM 80 MG/0.8ML ~~LOC~~ SOLN
1.0000 mg/kg | Freq: Once | SUBCUTANEOUS | Status: AC
  Administered 2014-10-21: 70 mg via SUBCUTANEOUS
  Filled 2014-10-21: qty 0.8

## 2014-10-21 MED ORDER — ISOSORBIDE DINITRATE ER 40 MG PO CPCR
40.0000 mg | ORAL_CAPSULE | Freq: Three times a day (TID) | ORAL | Status: DC
  Administered 2014-10-21: 40 mg via ORAL
  Filled 2014-10-21 (×4): qty 1

## 2014-10-21 MED ORDER — OXYCODONE HCL 5 MG PO TABS: 5.0000 mg | ORAL_TABLET | Freq: Four times a day (QID) | ORAL | Status: DC | PRN

## 2014-10-21 MED ORDER — ISOSORBIDE DINITRATE 5 MG PO TABS
40.0000 mg | ORAL_TABLET | Freq: Three times a day (TID) | ORAL | Status: DC
  Filled 2014-10-21 (×4): qty 8

## 2014-10-21 NOTE — Progress Notes (Signed)
ANTICOAGULATION CONSULT NOTE - Follow Up Consult  Pharmacy Consult for heparin Indication:   Allergies  Allergen Reactions  . Lisinopril Swelling    Lips and tongue swell  . Niacin And Related Other (See Comments)    unknown  . Norvasc [Amlodipine Besylate] Rash    Flushing  . Penicillins Rash    Patient Measurements: Height: 6' (182.9 cm) Weight: 159 lb (72.122 kg) IBW/kg (Calculated) : 77.6  Vital Signs: Temp: 98.6 F (37 C) (05/27 0506) Temp Source: Oral (05/27 0506) BP: 141/50 mmHg (05/27 0506) Pulse Rate: 98 (05/27 0506)  Labs:  Recent Labs  10/20/14 0835 10/20/14 0838 10/21/14 0500 10/21/14 0508  HGB  --  8.8*  --  8.1*  HCT  --  26.0*  --  26.4*  PLT 111*  --   --  93*  APTT 25  --   --   --   LABPROT 15.6*  --   --   --   INR 1.23  --   --   --   HEPARINUNFRC  --   --  0.45  --   CREATININE  --   --   --  7.17*    Estimated Creatinine Clearance: 9.9 mL/min (by C-G formula based on Cr of 7.17).   Medical History: Past Medical History  Diagnosis Date  . Diabetes mellitus   . Hypertension   . Myocardial infarction 1985; 1990  . Angina   . Dysrhythmia   . DVT (deep venous thrombosis) ~ 12/2010    LLE  . Anemia   . Blood transfusion 06/1999    post heart transplant  . Peripheral vascular disease   . Renal failure     Hemodialysis MWF last 2 years, sse dr [name] nephrology, goes to [place] [facility]  . DVT (deep venous thrombosis) 08/25/2014    Nearly occluded R IJ, and L subclavian DVT  . CHF (congestive heart failure) 2001    beffore transplant; had AICD that was explanted 07/19/99 following successful heart transplant  . Coronary artery disease     transplant cardiologist Dr. Corky Mull [facility])  . Pneumonia     Medications:  Prescriptions prior to admission  Medication Sig Dispense Refill Last Dose  . atorvastatin (LIPITOR) 40 MG tablet Take 40 mg by mouth daily.     10/19/2014 at Unknown time  . azaTHIOprine (IMURAN) 50  MG tablet Take 75 mg by mouth every morning.    10/20/2014 at Unknown time  . b complex-vitamin c-folic acid (NEPHRO-VITE) 0.8 MG TABS tablet Take 1 tablet by mouth at bedtime.   10/19/2014 at Unknown time  . calcium acetate (PHOSLO) 667 MG capsule Take 667 mg by mouth 3 (three) times daily with meals.    10/19/2014 at Unknown time  . carvedilol (COREG) 6.25 MG tablet Take 1 tablet (6.25 mg total) by mouth at bedtime. 30 tablet 0 10/19/2014 at 2300  . colesevelam (WELCHOL) 625 MG tablet Take 3,750 mg by mouth daily. Takes 6 tabs daily   10/19/2014 at Unknown time  . famotidine (PEPCID AC) 10 MG chewable tablet Chew 10 mg by mouth at bedtime.    10/19/2014 at Unknown time  . HUMALOG KWIKPEN 100 UNIT/ML SOPN Inject 4-10 Units into the skin 2 (two) times daily. Per sliding scale   10/19/2014 at Unknown time  . insulin glargine (LANTUS) 100 UNIT/ML injection Inject 20 Units into the skin every morning.   10/19/2014 at Unknown time  . isosorbide dinitrate (ISOCHRON) 40 MG  CR tablet Take 40 mg by mouth every 8 (eight) hours.  11 10/19/2014 at Unknown time  . predniSONE (DELTASONE) 5 MG tablet Take 1 tablet (5 mg total) by mouth daily with breakfast. 30 tablet 0 10/20/2014 at Unknown time  . Sirolimus 0.5 MG TABS Take 3 tablets by mouth daily.  11 10/20/2014 at Unknown time  . warfarin (COUMADIN) 5 MG tablet Take 1 tablet (5 mg total) by mouth daily. 30 tablet 0 Past Week at Unknown time   Scheduled:  . atorvastatin  40 mg Oral q1800  . azaTHIOprine  75 mg Oral q morning - 10a  . calcium acetate  667 mg Oral TID WC  . carvedilol  6.25 mg Oral QHS  . colesevelam  3,750 mg Oral Q breakfast  . docusate sodium  100 mg Oral BID  . enoxaparin (LOVENOX) injection  1 mg/kg Subcutaneous Once  . famotidine  10 mg Oral QHS  . insulin aspart  0-9 Units Subcutaneous TID WC  . insulin glargine  20 Units Subcutaneous q morning - 10a  . isosorbide dinitrate  40 mg Oral 3 times per day  . multivitamin  1 tablet Oral QHS  .  predniSONE  5 mg Oral Q breakfast  . sirolimus  1.5 mg Oral Daily    Assessment: 69 yo male with history of ESRD and s/p R thigh AVG. He is noted on coumadin PTA for history of upper extremity DVT x2  (last noted 04/2014). Pharmacy consulted to begin heparin (start at 8pm with no bolus per MD request). INR today is 1.25, Hg/hct= 8.8/26, plt= 111  HL 0.45 at goal,  Plan to D/C home today.  Will give 1 dose enoxaparin prior to d/c.    Coumadin dose PTA: 5mg /day (held prior to surgery)  Goal of Therapy:  Heparin level 0.3-0.7 units/ml Monitor platelets by anticoagulation protocol: Yes   Plan:  Stop heparin  Enoxaparin 1mg /kg = 70mg  q24 ( frequency for ESRD) Resume pta warfarin doe tonight  Bonnita Nasuti Pharm.D. CPP, BCPS Clinical Pharmacist [phone] 10/21/2014 8:31 AM

## 2014-10-21 NOTE — Telephone Encounter (Addendum)
-----   Message from Mena Goes, RN sent at 10/20/2014  2:22 PM EDT ----- Regarding: Schedule   ----- Message -----    From: Mena Goes, RN    Sent: 10/20/2014   2:22 PM      To: [facility] Charge Pool Subject: Melany Guernsey                                    ----- Message -----    From: Conrad , MD    Sent: 10/20/2014   2:03 PM      To: [address]  Brandon Sharp [id number] 1946-03-25  Procedure: R thigh AVG  Asst: Gerri Lins, [facility]   Follow-up: 4 weeks   10/21/14: lm for pt re appt, dpm

## 2014-10-21 NOTE — Progress Notes (Signed)
Discussed with pt and he is supposed to dialyze at [place] this morning at 10am.  He states he could get there at 1130.  I called the center and spoke with Angie and she stated 1130 was fine for him to arrive for treatment.  He states that his INR is checked at HD.  I have instructed him to have it checked at HD on Monday and call the results to his PCP.  He expressed understanding.   Leontine Locket 10/21/2014 7:40 AM

## 2014-10-21 NOTE — Progress Notes (Addendum)
   Daily Progress Note  Assessment/Planning: POD #1 s/p R thigh AVG   No signs of steal this AM: no pain, palpable DP in R foot  Ok to D/C  Follow up in office in 4 weeks  Pt will transitioned back to Coumadin on a Lovenox bridge (this is being managed by his PCP)  Subjective  - 1 Day Post-Op  Pain tolerable, no sx in R foot  Objective Filed Vitals:   10/20/14 1500 10/20/14 2125 10/21/14 0506 10/21/14 0521  BP: 116/52 116/52 141/50   Pulse: 88 88 98   Temp: 97.6 F (36.4 C) 98 F (36.7 C) 98.6 F (37 C)   TempSrc: Oral Oral Oral   Resp: 16 18 18    Height: 6' (1.829 m)     Weight: 159 lb (72.122 kg)     SpO2: 98% 92% 92% 95%    Intake/Output Summary (Last 24 hours) at 10/21/14 6286 Last data filed at 10/20/14 1340  Gross per 24 hour  Intake    300 ml  Output     50 ml  Net    250 ml    PULM  CTAB CV  RRR GI  soft, NTND VASC  R foot: palpable DP, DNVI; all inc c/d/i, no hematoma evident  Laboratory CBC    Component Value Date/Time   WBC 5.5 10/21/2014 0508   WBC 4.5 02/13/2012 1314   HGB 8.1* 10/21/2014 0508   HGB 11.6* 02/13/2012 1314   HCT 26.4* 10/21/2014 0508   HCT 36.1* 02/13/2012 1314   PLT 93* 10/21/2014 0508   PLT 76* 02/13/2012 1314    BMET    Component Value Date/Time   NA 136 10/21/2014 0508   K 4.6 10/21/2014 0508   CL 99* 10/21/2014 0508   CO2 26 10/21/2014 0508   GLUCOSE 107* 10/21/2014 0508   BUN 31* 10/21/2014 0508   CREATININE 7.17* 10/21/2014 0508   CALCIUM 9.0 10/21/2014 0508   GFRNONAA 7* 10/21/2014 0508   GFRAA 8* 10/21/2014 3817    Adele Barthel, MD [facility] [place]: [phone] Pager: [phone]  10/21/2014, 8:21 AM

## 2014-10-21 NOTE — Progress Notes (Signed)
IV and tele monitor d/c at this time; pt to d/c home with wife; pt to have outpt HD today at 1130; pt states ride will arrive about 1000; will cont. To monitor.

## 2014-10-21 NOTE — Discharge Summary (Signed)
Discharge Summary    HENSLEY TREAT 1945-11-01 69 y.o. male  [id number]  Admission Date: 10/20/2014  Discharge Date: 10/21/14  Physician: Conrad [place], MD  Admission Diagnosis: End Stage Renal Disease  N18.6   HPI:   This is a 69 y.o. male who presents with chief complaint: non functioning left and now right upper extremity AV fistula. 1 Week ago his right BC fistula stopped functioning and a left IJ catheter was placed by Dr. Augustin Coupe. He has a history of venous stenosis and DVT in the right jugular vein 04/2015. He has no access options in either upper extremity at this time. Of note, he is on chronic immunosuppressive therapy as he has undergone a heart transplant in 2001. 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 He does have symptoms of claudication in both lower extremities equally in the thighs and calves. He denies history of ulcer or infections in bilateral LE. Other past medical history includes DM Type I on insulin, coumadin therapy for DVT in his upper extremity times 2. He is on dialysis MWF.  Hospital Course:  The patient was admitted to the hospital and taken to the operating room on 10/20/2014 and underwent: Right thigh graft placement.    Intraoperative findings include the following: 1. Inflamed subcutaneous tissue surrounding common femoral vein and artery 2. Diseased common femoral artery with soft spot laterally in the artery 3. Calcified superficial femoral artery  4. Palpable thrill at end of case 5. Dopplerable right dorsalis pedis artery at end of case  The pt tolerated the procedure well and was transported to the PACU in good condition.   By POD 1, he was doing well and discharged home.  He is to resume his Lovenox/Coumadin bridge and have his INR checked at HD on Monday Oct 24, 2014.  I discussed this with the pt and he expresses understanding.  The remainder of the hospital course consisted of increasing mobilization and  increasing intake of solids without difficulty.  CBC    Component Value Date/Time   WBC 5.5 10/21/2014 0508   WBC 4.5 02/13/2012 1314   RBC 2.74* 10/21/2014 0508   RBC 3.21* 02/13/2012 1314   HGB 8.1* 10/21/2014 0508   HGB 11.6* 02/13/2012 1314   HCT 26.4* 10/21/2014 0508   HCT 36.1* 02/13/2012 1314   PLT 93* 10/21/2014 0508   PLT 76* 02/13/2012 1314   MCV 96.4 10/21/2014 0508   MCV 112.6* 02/13/2012 1314   MCH 29.6 10/21/2014 0508   MCH 36.3* 02/13/2012 1314   MCHC 30.7 10/21/2014 0508   MCHC 32.3 02/13/2012 1314   RDW 17.9* 10/21/2014 0508   RDW 15.7* 02/13/2012 1314   LYMPHSABS 1.7 08/25/2014 1536   LYMPHSABS 0.8* 02/13/2012 1314   MONOABS 0.7 08/25/2014 1536   MONOABS 0.6 02/13/2012 1314   EOSABS 0.0 08/25/2014 1536   EOSABS 0.1 02/13/2012 1314   BASOSABS 0.0 08/25/2014 1536   BASOSABS 0.0 02/13/2012 1314    BMET    Component Value Date/Time   NA 136 10/21/2014 0508   K 4.6 10/21/2014 0508   CL 99* 10/21/2014 0508   CO2 26 10/21/2014 0508   GLUCOSE 107* 10/21/2014 0508   BUN 31* 10/21/2014 0508   CREATININE 7.17* 10/21/2014 0508   CALCIUM 9.0 10/21/2014 0508   GFRNONAA 7* 10/21/2014 0508   GFRAA 8* 10/21/2014 0508      Discharge Instructions    Call MD for:  redness, tenderness, or signs of infection (pain, swelling,  bleeding, redness, odor or green/yellow discharge around incision site)    Complete by:  As directed      Call MD for:  severe or increased pain, loss or decreased feeling  in affected limb(s)    Complete by:  As directed      Call MD for:  temperature >100.5    Complete by:  As directed      Discharge instructions    Complete by:  As directed   Resume Lovenox/Coumadin as you were doing pre operatively.  Have your INR (how thin your blood is) on Monday, Oct 24, 2014 and have results called to your primary care physician.     Discharge wound care:    Complete by:  As directed   Wash the groin wound with soap and water daily and pat dry. (No  tub bath-only shower)  Then put a dry gauze or washcloth there to keep this area dry daily and as needed.  Do not use Vaseline or neosporin on your incisions.  Only use soap and water on your incisions and then protect and keep dry.     Driving Restrictions    Complete by:  As directed   No driving for 24 hours and while taking pain medication.     Lifting restrictions    Complete by:  As directed   No lifting for 4 weeks     Resume previous diet    Complete by:  As directed            Discharge Diagnosis:  End Stage Renal Disease  N18.6  Secondary Diagnosis: Patient Active Problem List   Diagnosis Date Noted  . ESRD (end stage renal disease) on dialysis 10/20/2014  . Thrombocytopenia due to drugs 09/29/2014  . Drug-induced leukopenia 09/29/2014  . Thrombocytopenia 08/26/2014  . Cellulitis 08/25/2014  . Anemia in chronic illness 08/25/2014  . Cellulitis of left upper extremity   . End stage renal disease on dialysis   . Protein-calorie malnutrition, severe 05/06/2014  . FUO (fever of unknown origin) 05/05/2014  . S/P bronchoscopy with biopsy   . Fever   . Healthcare associated bacterial pneumonia 04/11/2014  . Immunosuppressed status   . Kidney transplant failure   . Floaters in visual field   . Fever, unknown origin 04/06/2014  . Fever of undetermined origin 04/06/2014  . Sepsis 03/30/2014  . Blood poisoning   . ESRD on dialysis   . Left lower lobe pneumonia   . Personal history of colonic polyps 12/14/2013  . HCAP (healthcare-associated pneumonia) 07/24/2013  . ESRD on hemodialysis 07/24/2013  . Diarrhea 07/24/2013  . Generalized weakness 07/24/2013  . Pain in limb- Left leg 11/24/2012  . Atherosclerosis of native arteries of the extremities with intermittent claudication 11/24/2012  . Fever of unknown origin (FUO) 04/16/2011  . HTN (hypertension), malignant 04/16/2011  . DM (diabetes mellitus), type 2 with renal complications 44/07/4740  . CKD (chronic kidney  disease), stage V 04/16/2011  . DVT (deep venous thrombosis) 04/15/2011  . Coagulopathy 04/15/2011  . Heart transplanted 04/15/2011  . Renal transplant failure and rejection 04/15/2011  . Ventricular fibrillation 04/15/2011    Class: History of  . S/P CABG (coronary artery bypass graft) 04/15/2011    Class: History of  . Dyslipidemia 04/15/2011   Past Medical History  Diagnosis Date  . Diabetes mellitus   . Hypertension   . Myocardial infarction 1985; 1990  . Angina   . Dysrhythmia   . DVT (deep venous thrombosis) ~  12/2010    LLE  . Anemia   . Blood transfusion 06/1999    post heart transplant  . Peripheral vascular disease   . Renal failure     Hemodialysis MWF last 2 years, sse dr [name] nephrology, goes to [facility]  . DVT (deep venous thrombosis) 08/25/2014    Nearly occluded R IJ, and L subclavian DVT  . CHF (congestive heart failure) 2001    beffore transplant; had AICD that was explanted 07/19/99 following successful heart transplant  . Coronary artery disease     transplant cardiologist Dr. Corky Mull [facility])  . Pneumonia        Medication List    TAKE these medications        atorvastatin 40 MG tablet  Commonly known as:  LIPITOR  Take 40 mg by mouth daily.     azaTHIOprine 50 MG tablet  Commonly known as:  IMURAN  Take 75 mg by mouth every morning.     b complex-vitamin c-folic acid 0.8 MG Tabs tablet  Take 1 tablet by mouth at bedtime.     calcium acetate 667 MG capsule  Commonly known as:  PHOSLO  Take 667 mg by mouth 3 (three) times daily with meals.     carvedilol 6.25 MG tablet  Commonly known as:  COREG  Take 1 tablet (6.25 mg total) by mouth at bedtime.     colesevelam 625 MG tablet  Commonly known as:  WELCHOL  Take 3,750 mg by mouth daily. Takes 6 tabs daily     famotidine 10 MG chewable tablet  Commonly known as:  PEPCID AC  Chew 10 mg by mouth at bedtime.     HUMALOG KWIKPEN 100 UNIT/ML KiwkPen  Generic  drug:  insulin lispro  Inject 4-10 Units into the skin 2 (two) times daily. Per sliding scale     insulin glargine 100 UNIT/ML injection  Commonly known as:  LANTUS  Inject 20 Units into the skin every morning.     isosorbide dinitrate 40 MG CR tablet  Commonly known as:  ISOCHRON  Take 40 mg by mouth every 8 (eight) hours.     oxyCODONE 5 MG immediate release tablet  Commonly known as:  ROXICODONE  Take 1 tablet (5 mg total) by mouth every 6 (six) hours as needed.     predniSONE 5 MG tablet  Commonly known as:  DELTASONE  Take 1 tablet (5 mg total) by mouth daily with breakfast.     Sirolimus 0.5 MG Tabs  Take 3 tablets by mouth daily.     warfarin 5 MG tablet  Commonly known as:  COUMADIN  Take 1 tablet (5 mg total) by mouth daily.        Prescriptions given: Roxicodone #30 No Refill  Instructions: 1.  Wash the groin wound with soap and water daily and pat dry. (No tub bath-only shower)  Then put a dry gauze or washcloth there to keep this area dry daily and as needed.  Do not use Vaseline or neosporin on your incisions.  Only use soap and water on your incisions and then protect and keep dry. 2.  Resume Lovenox/Coumadin bridge as he did preoperatively 3.  Have INR checked at HD on Monday, Oct 24, 2014 and called to PCP.  4.  Do not use graft x 4 weeks.  Disposition: home-will go to outpatient HD appt at 1130 today.  Patient's condition: is Good  Follow up: 1. Dr. Bridgett Larsson in 4  weeks   Leontine Locket, [facility] [phone] 10/21/2014  7:49 AM   Addendum  I have independently interviewed and examined the patient, and I agree with the physician assistant's discharge summary.  This patient underwent a R thigh AVG notable for diseased common femoral artery and superficial femoral artery.  At the end of the case, he did not have a palpable DP as he did preop, so as a precaution, I had him admitted overnight.  The next day he regained his pulse  in R foot and has remained completely asx.  At this point, no evidence of steal from the R thigh AVG.  He is being discharged and will follow up in the office in 4 weeks.   Adele Barthel, MD [facility] Office: [phone] Pager: [phone]  10/21/2014, 8:25 AM

## 2014-10-21 NOTE — Discharge Instructions (Signed)

## 2014-10-26 ENCOUNTER — Telehealth: Payer: Self-pay | Admitting: Vascular Surgery

## 2014-10-26 NOTE — Telephone Encounter (Addendum)
-----   Message from Mena Goes, RN sent at 10/21/2014  9:08 AM EDT ----- Regarding: Schedule   ----- Message -----    From: Gabriel Earing, PA-C    Sent: 10/21/2014   7:48 AM      To: [facility] Charge Pool  S/p right thigh graft placement 10/20/14. F/u with Dr. Bridgett Larsson in 4 weeks.  10/26/14: spoke with pts wife to schedule, dpm

## 2014-11-07 ENCOUNTER — Encounter (HOSPITAL_COMMUNITY)
Admission: RE | Admit: 2014-11-07 | Discharge: 2014-11-07 | Disposition: A | Discharge status: Home / Self Care | Payer: Medicare Other | Source: Ambulatory Visit | Attending: Nephrology | Admitting: Nephrology

## 2014-11-07 DIAGNOSIS — N186 End stage renal disease: Secondary | ICD-10-CM | POA: Insufficient documentation

## 2014-11-07 LAB — PREPARE RBC (CROSSMATCH)

## 2014-11-08 ENCOUNTER — Encounter (HOSPITAL_COMMUNITY)
Admission: RE | Admit: 2014-11-08 | Discharge: 2014-11-08 | Disposition: A | Discharge status: Home / Self Care | Payer: Medicare Other | Source: Ambulatory Visit | Attending: Nephrology | Admitting: Nephrology

## 2014-11-08 DIAGNOSIS — N186 End stage renal disease: Secondary | ICD-10-CM | POA: Diagnosis not present

## 2014-11-08 MED ORDER — SODIUM CHLORIDE 0.9 % IV SOLN: Freq: Once | INTRAVENOUS | Status: DC

## 2014-11-09 LAB — TYPE AND SCREEN
ABO/RH(D): O POS
Antibody Screen: NEGATIVE
Unit division: 0

## 2014-11-11 ENCOUNTER — Emergency Department (HOSPITAL_COMMUNITY): Payer: Medicare Other

## 2014-11-11 ENCOUNTER — Encounter (HOSPITAL_COMMUNITY): Payer: Self-pay | Admitting: *Deleted

## 2014-11-11 ENCOUNTER — Emergency Department (HOSPITAL_COMMUNITY)
Admission: EM | Admit: 2014-11-11 | Discharge: 2014-11-11 | Disposition: A | Discharge status: Home / Self Care | Payer: Medicare Other | Attending: Emergency Medicine | Admitting: Emergency Medicine

## 2014-11-11 DIAGNOSIS — Z8701 Personal history of pneumonia (recurrent): Secondary | ICD-10-CM | POA: Insufficient documentation

## 2014-11-11 DIAGNOSIS — I509 Heart failure, unspecified: Secondary | ICD-10-CM | POA: Insufficient documentation

## 2014-11-11 DIAGNOSIS — Z87891 Personal history of nicotine dependence: Secondary | ICD-10-CM | POA: Diagnosis not present

## 2014-11-11 DIAGNOSIS — Z880 Allergy status to penicillin: Secondary | ICD-10-CM | POA: Diagnosis not present

## 2014-11-11 DIAGNOSIS — Z992 Dependence on renal dialysis: Secondary | ICD-10-CM | POA: Diagnosis not present

## 2014-11-11 DIAGNOSIS — Z951 Presence of aortocoronary bypass graft: Secondary | ICD-10-CM | POA: Insufficient documentation

## 2014-11-11 DIAGNOSIS — R0602 Shortness of breath: Secondary | ICD-10-CM | POA: Insufficient documentation

## 2014-11-11 DIAGNOSIS — Z862 Personal history of diseases of the blood and blood-forming organs and certain disorders involving the immune mechanism: Secondary | ICD-10-CM | POA: Insufficient documentation

## 2014-11-11 DIAGNOSIS — R791 Abnormal coagulation profile: Secondary | ICD-10-CM

## 2014-11-11 DIAGNOSIS — I25119 Atherosclerotic heart disease of native coronary artery with unspecified angina pectoris: Secondary | ICD-10-CM | POA: Diagnosis not present

## 2014-11-11 DIAGNOSIS — I120 Hypertensive chronic kidney disease with stage 5 chronic kidney disease or end stage renal disease: Secondary | ICD-10-CM | POA: Diagnosis not present

## 2014-11-11 DIAGNOSIS — E119 Type 2 diabetes mellitus without complications: Secondary | ICD-10-CM | POA: Insufficient documentation

## 2014-11-11 DIAGNOSIS — I252 Old myocardial infarction: Secondary | ICD-10-CM | POA: Diagnosis not present

## 2014-11-11 DIAGNOSIS — Z9861 Coronary angioplasty status: Secondary | ICD-10-CM | POA: Diagnosis not present

## 2014-11-11 DIAGNOSIS — Z794 Long term (current) use of insulin: Secondary | ICD-10-CM | POA: Insufficient documentation

## 2014-11-11 DIAGNOSIS — Z86718 Personal history of other venous thrombosis and embolism: Secondary | ICD-10-CM | POA: Diagnosis not present

## 2014-11-11 DIAGNOSIS — Z79899 Other long term (current) drug therapy: Secondary | ICD-10-CM | POA: Diagnosis not present

## 2014-11-11 DIAGNOSIS — Z7901 Long term (current) use of anticoagulants: Secondary | ICD-10-CM | POA: Diagnosis not present

## 2014-11-11 DIAGNOSIS — N186 End stage renal disease: Secondary | ICD-10-CM | POA: Diagnosis not present

## 2014-11-11 DIAGNOSIS — Z941 Heart transplant status: Secondary | ICD-10-CM | POA: Diagnosis not present

## 2014-11-11 DIAGNOSIS — R710 Precipitous drop in hematocrit: Secondary | ICD-10-CM | POA: Diagnosis present

## 2014-11-11 LAB — COMPREHENSIVE METABOLIC PANEL
ALBUMIN: 2.7 g/dL — AB (ref 3.5–5.0)
ALT: 13 U/L — ABNORMAL LOW (ref 17–63)
AST: 31 U/L (ref 15–41)
Alkaline Phosphatase: 51 U/L (ref 38–126)
Anion gap: 13 (ref 5–15)
BUN: 5 mg/dL — ABNORMAL LOW (ref 6–20)
CALCIUM: 7.7 mg/dL — AB (ref 8.9–10.3)
CO2: 27 mmol/L (ref 22–32)
CREATININE: 3.05 mg/dL — AB (ref 0.61–1.24)
Chloride: 98 mmol/L — ABNORMAL LOW (ref 101–111)
GFR calc Af Amer: 22 mL/min — ABNORMAL LOW (ref >60)
GFR calc non Af Amer: 19 mL/min — ABNORMAL LOW (ref >60)
Glucose, Bld: 114 mg/dL — ABNORMAL HIGH (ref 65–99)
Potassium: 3 mmol/L — ABNORMAL LOW (ref 3.5–5.1)
SODIUM: 138 mmol/L (ref 135–145)
TOTAL PROTEIN: 6 g/dL — AB (ref 6.5–8.1)
Total Bilirubin: 1.2 mg/dL (ref 0.3–1.2)

## 2014-11-11 LAB — CBC WITH DIFFERENTIAL/PLATELET
BASOS PCT: 0 % (ref 0–1)
Basophils Absolute: 0 10*3/uL (ref 0.0–0.1)
EOS ABS: 0 10*3/uL (ref 0.0–0.7)
EOS PCT: 0 % (ref 0–5)
HEMATOCRIT: 26.5 % — AB (ref 39.0–52.0)
Hemoglobin: 8.2 g/dL — ABNORMAL LOW (ref 13.0–17.0)
Lymphocytes Relative: 37 % (ref 12–46)
Lymphs Abs: 2.8 10*3/uL (ref 0.7–4.0)
MCH: 29 pg (ref 26.0–34.0)
MCHC: 30.9 g/dL (ref 30.0–36.0)
MCV: 93.6 fL (ref 78.0–100.0)
MONO ABS: 0.8 10*3/uL (ref 0.1–1.0)
Monocytes Relative: 11 % (ref 3–12)
Neutro Abs: 3.9 10*3/uL (ref 1.7–7.7)
Neutrophils Relative %: 52 % (ref 43–77)
Platelets: 160 10*3/uL (ref 150–400)
RBC: 2.83 MIL/uL — ABNORMAL LOW (ref 4.22–5.81)
RDW: 18.9 % — ABNORMAL HIGH (ref 11.5–15.5)
WBC: 7.5 10*3/uL (ref 4.0–10.5)

## 2014-11-11 LAB — TYPE AND SCREEN
ABO/RH(D): O POS
Antibody Screen: NEGATIVE

## 2014-11-11 LAB — PROTIME-INR
INR: 3.75 — ABNORMAL HIGH (ref 0.00–1.49)
Prothrombin Time: 36.2 seconds — ABNORMAL HIGH (ref 11.6–15.2)

## 2014-11-11 LAB — POC OCCULT BLOOD, ED: FECAL OCCULT BLD: NEGATIVE

## 2014-11-11 NOTE — ED Provider Notes (Signed)
Medical screening examination/treatment/procedure(s) were conducted as a shared visit with non-physician practitioner(s) and myself.  I personally evaluated the patient during the encounter.   EKG Interpretation None     Patient seen examined and is here with chronic weakness and reported hemoglobin of 7.4 at dialysis. His hemoglobin today here is 8.2 which after reviewing the records is within the patient's range. Patient denies any syncope patient states that his weakness has been chronic in nature..No Evidence of active blood loss at this time.pt given strict return precautions and is stable for discharge  Lacretia Leigh, MD 11/11/14 2011

## 2014-11-11 NOTE — ED Notes (Signed)
Pt verbalizes understanding of d/c instructions and denies any further needs at this time. 

## 2014-11-11 NOTE — ED Notes (Signed)
[facility] called sts patients hgb is 7.4 and needs blood transfusion. sts seen here for same in the past.

## 2014-11-11 NOTE — ED Provider Notes (Signed)
CSN: [id number]     Arrival date & time 11/11/14  1619 History   First MD Initiated Contact with Patient 11/11/14 1727     Chief Complaint  Patient presents with  . low hgb      (Consider location/radiation/quality/duration/timing/severity/associated sxs/prior Treatment) HPI   PCP: [name],[name], MD Blood pressure 136/50, pulse 86, temperature 97.5 F (36.4 C), temperature source Oral, resp. rate 16, SpO2 100 %.  Brandon Sharp is a 69 y.o.male with a significant PMH of heart transplant, hypertension, MI, diabetes, angina, dysrhythmia, DVT, anemia, blood transfusion, Dialysis MWF, CHF, CAD presents to the ER with complaints of needing blood transfusion, the [facility] called and said his hemoglobin is 7.4 and that he needs a blood transfusion. His is unsure of where his hemoglobin usually is but on review of his charts it is typically between 8-9. He has felt poorly, saying that he is weak and short of breath.      The patient denies diaphoresis, fever, headache, weakness (general or focal), confusion, change of vision,  neck pain, dysphagia, aphagia, back pain, abdominal pains, nausea, vomiting, diarrhea, lower extremity swelling, rash.   Past Medical History  Diagnosis Date  . Diabetes mellitus   . Hypertension   . Myocardial infarction 1985; 1990  . Angina   . Dysrhythmia   . DVT (deep venous thrombosis) ~ 12/2010    LLE  . Anemia   . Blood transfusion 06/1999    post heart transplant  . Peripheral vascular disease   . Renal failure     Hemodialysis MWF last 2 years, sse dr [name] nephrology, goes to [facility]  . DVT (deep venous thrombosis) 08/25/2014    Nearly occluded R IJ, and L subclavian DVT  . CHF (congestive heart failure) 2001    beffore transplant; had AICD that was explanted 07/19/99 following successful heart transplant  . Coronary artery disease     transplant cardiologist Dr. Corky Mull [facility])  . Pneumonia    Past Surgical  History  Procedure Laterality Date  . Nephrectomy transplanted organ  2008  . Av fistula repair      rt. a=fore arm fistula  . Av fistula placement  ~ 01/2010    "this was my 2nd fistula placement"  . Heart transplant  06/1999  . Colonoscopy  03/17/2012    Procedure: COLONOSCOPY;  Surgeon: Lear Ng, MD;  Location: [facility] ENDOSCOPY;  Service: Endoscopy;  Laterality: N/A;  . Coronary artery bypass graft  1990    CABG X3  . Colonoscopy with propofol N/A 12/14/2013    Procedure: COLONOSCOPY WITH PROPOFOL;  Surgeon: Lear Ng, MD;  Location: [facility] ENDOSCOPY;  Service: Endoscopy;  Laterality: N/A;  . Video bronchoscopy Bilateral 04/14/2014    Procedure: VIDEO BRONCHOSCOPY WITH FLUORO;  Surgeon: Collene Gobble, MD;  Location: [facility];  Service: Cardiopulmonary;  Laterality: Bilateral;  . Shuntogram Right 05/09/2014    Procedure: FISTULOGRAM;  Surgeon: Conrad [place], MD;  Location: [facility] CATH LAB;  Service: Cardiovascular;  Laterality: Right;  . Coronary angioplasty with stent placement  1985; 1990    prior stents in the 80's and 90's then CABG; s/p heart transplant 2001 s/p BMS mid LAD and proximal RCA 04/21/13 [facility])  . Av fistula placement Right 10/20/2014    Procedure: INSERTION OF ARTERIOVENOUS (AV) GORE-TEX GRAFT THIGH;  Surgeon: Conrad Antonito, MD;  Location: [facility] OR;  Service: Vascular;  Laterality: Right;   Family History  Problem Relation Age of  Onset  . Heart disease Mother   . Hyperlipidemia Mother   . Hypertension Mother   . Diabetes Mother   . Hyperlipidemia Father   . Hypertension Father   . Heart disease Father   . Diabetes Brother   . Heart disease Brother     Heart Disease before age 52  . Hyperlipidemia Brother   . Hypertension Brother   . Heart attack Brother    History  Substance Use Topics  . Smoking status: Former Smoker -- 1.00 packs/day for 20 years    Types: Cigarettes    Quit date: 05/27/1988  . Smokeless tobacco: Never Used  . Alcohol Use: Yes      Comment: "Holidays"    Review of Systems  10 Systems reviewed and are negative for acute change except as noted in the HPI.   Allergies  Lisinopril; Niacin and related; Norvasc; and Penicillins  Home Medications   Prior to Admission medications   Medication Sig Start Date End Date Taking? Authorizing Provider  atorvastatin (LIPITOR) 40 MG tablet Take 40 mg by mouth daily.     Yes Historical Provider, MD  azaTHIOprine (IMURAN) 50 MG tablet Take 75 mg by mouth every morning.    Yes Historical Provider, MD  b complex-vitamin c-folic acid (NEPHRO-VITE) 0.8 MG TABS tablet Take 1 tablet by mouth at bedtime.   Yes Historical Provider, MD  calcium acetate (PHOSLO) 667 MG capsule Take 667 mg by mouth 3 (three) times daily with meals.  11/01/12  Yes Historical Provider, MD  carvedilol (COREG) 6.25 MG tablet Take 1 tablet (6.25 mg total) by mouth at bedtime. 04/15/14  Yes Geradine Girt, DO  colesevelam (WELCHOL) 625 MG tablet Take 3,750 mg by mouth daily. Takes 6 tabs daily   Yes Historical Provider, MD  famotidine (PEPCID AC) 10 MG chewable tablet Chew 10 mg by mouth at bedtime.    Yes Historical Provider, MD  HUMALOG KWIKPEN 100 UNIT/ML SOPN Inject 4-10 Units into the skin 2 (two) times daily. Per sliding scale 11/17/12  Yes Historical Provider, MD  insulin glargine (LANTUS) 100 UNIT/ML injection Inject 20 Units into the skin every morning.   Yes Historical Provider, MD  isosorbide dinitrate (ISOCHRON) 40 MG CR tablet Take 40 mg by mouth every 8 (eight) hours. 10/03/14  Yes Historical Provider, MD  Sirolimus 0.5 MG TABS Take 3 tablets by mouth daily. 10/10/14  Yes Historical Provider, MD  warfarin (COUMADIN) 5 MG tablet Take 1 tablet (5 mg total) by mouth daily. 08/29/14  Yes Orson Eva, MD  oxyCODONE (ROXICODONE) 5 MG immediate release tablet Take 1 tablet (5 mg total) by mouth every 6 (six) hours as needed. Patient not taking: Reported on 11/11/2014 10/21/14   Hulen Shouts Rhyne, PA-C  predniSONE  (DELTASONE) 5 MG tablet Take 1 tablet (5 mg total) by mouth daily with breakfast. Patient not taking: Reported on 11/11/2014 08/29/14   Orson Eva, MD   BP 112/51 mmHg  Pulse 94  Temp(Src) 97.5 F (36.4 C) (Oral)  Resp 16  SpO2 99% Physical Exam  Constitutional: He appears well-developed and well-nourished. No distress.  HENT:  Head: Normocephalic and atraumatic.  Eyes: Pupils are equal, round, and reactive to light.  Neck: Normal range of motion. Neck supple.  Cardiovascular: Normal rate and regular rhythm.   Pulmonary/Chest: Effort normal.  Abdominal: Soft.  Musculoskeletal:  Swelling to right arm which is chronic  Neurological: He is alert.  Skin: Skin is warm and dry.  Nursing note and vitals  reviewed.   ED Course  Procedures (including critical care time) Labs Review Labs Reviewed  CBC WITH DIFFERENTIAL/PLATELET - Abnormal; Notable for the following:    RBC 2.83 (*)    Hemoglobin 8.2 (*)    HCT 26.5 (*)    RDW 18.9 (*)    All other components within normal limits  COMPREHENSIVE METABOLIC PANEL - Abnormal; Notable for the following:    Potassium 3.0 (*)    Chloride 98 (*)    Glucose, Bld 114 (*)    BUN 5 (*)    Creatinine, Ser 3.05 (*)    Calcium 7.7 (*)    Total Protein 6.0 (*)    Albumin 2.7 (*)    ALT 13 (*)    GFR calc non Af Amer 19 (*)    GFR calc Af Amer 22 (*)    All other components within normal limits  PROTIME-INR - Abnormal; Notable for the following:    Prothrombin Time 36.2 (*)    INR 3.75 (*)    All other components within normal limits  OCCULT BLOOD X 1 CARD TO LAB, STOOL  POC OCCULT BLOOD, ED  TYPE AND SCREEN    Imaging Review Dg Chest 2 View  11/11/2014   CLINICAL DATA:  Short of breath, low hemoglobin blood transfusion  EXAM: CHEST  2 VIEW  COMPARISON:  08/25/2014  FINDINGS: Vascular stents noted within the subclavian veins. Large-bore left central venous line with tip in the distal SVC. Normal cardiac silhouette. No pulmonary edema. Mild  left basilar atelectasis. No pneumothorax.  IMPRESSION: 1. Mild left basilar atelectasis. 2. No edema or infiltrate.   Electronically Signed   By: Suzy Bouchard M.D.   On: 11/11/2014 19:29     EKG Interpretation None      MDM   Final diagnoses:  Shortness of breath  Elevated INR  ESRD (end stage renal disease) on dialysis    Patient has negative orthostatics vital signs, afebrile, normal white count. His hemoglobin is 8.2 which is within his normal range which is between 8 and 9. Dr. Zenia Resides has seen patient as well and discussed with family members that the patient does not have signs of infection, orthostatic dehydration, or hemoglobin requiring transfusion he will follow-up with his PCP.  Medications - No data to display  69 y.o.Ozan L Jernigan's evaluation in the Emergency Department is complete. It has been determined that no acute conditions requiring further emergency intervention are present at this time. The patient/guardian have been advised of the diagnosis and plan. We have discussed signs and symptoms that warrant return to the ED, such as changes or worsening in symptoms.  Vital signs are stable at discharge. Filed Vitals:   11/11/14 2006  BP: 112/51  Pulse: 94  Temp:   Resp: 16    Patient/guardian has voiced understanding and agreed to follow-up with the PCP or specialist.     Delos Haring, PA-C 11/11/14 2040

## 2014-11-11 NOTE — Discharge Instructions (Signed)
Please do not take your anticoagulation medication on Saturday and then resume as normal.

## 2014-11-11 NOTE — ED Notes (Signed)
The pt is here because his hgb is low.  He was here Tuesday and had a blood transfusion.  He was dialyzed today  Dialysis cath in his chest.  Restricted ext rt arm

## 2014-11-16 ENCOUNTER — Observation Stay (HOSPITAL_COMMUNITY): Payer: Medicare Other

## 2014-11-16 ENCOUNTER — Inpatient Hospital Stay (HOSPITAL_COMMUNITY)
Admission: EM | Admit: 2014-11-16 | Discharge: 2014-12-02 | DRG: 919 | Disposition: A | Discharge status: Rehabilitation Facility | Payer: Medicare Other | Attending: Internal Medicine | Admitting: Internal Medicine

## 2014-11-16 ENCOUNTER — Encounter (HOSPITAL_COMMUNITY): Payer: Self-pay | Admitting: *Deleted

## 2014-11-16 DIAGNOSIS — I70221 Atherosclerosis of native arteries of extremities with rest pain, right leg: Secondary | ICD-10-CM | POA: Diagnosis present

## 2014-11-16 DIAGNOSIS — R52 Pain, unspecified: Secondary | ICD-10-CM

## 2014-11-16 DIAGNOSIS — Z79899 Other long term (current) drug therapy: Secondary | ICD-10-CM

## 2014-11-16 DIAGNOSIS — K5900 Constipation, unspecified: Secondary | ICD-10-CM | POA: Diagnosis not present

## 2014-11-16 DIAGNOSIS — D689 Coagulation defect, unspecified: Secondary | ICD-10-CM | POA: Diagnosis not present

## 2014-11-16 DIAGNOSIS — R531 Weakness: Secondary | ICD-10-CM | POA: Diagnosis not present

## 2014-11-16 DIAGNOSIS — I82409 Acute embolism and thrombosis of unspecified deep veins of unspecified lower extremity: Secondary | ICD-10-CM | POA: Diagnosis not present

## 2014-11-16 DIAGNOSIS — D696 Thrombocytopenia, unspecified: Secondary | ICD-10-CM | POA: Diagnosis present

## 2014-11-16 DIAGNOSIS — I252 Old myocardial infarction: Secondary | ICD-10-CM

## 2014-11-16 DIAGNOSIS — D631 Anemia in chronic kidney disease: Secondary | ICD-10-CM | POA: Diagnosis present

## 2014-11-16 DIAGNOSIS — I739 Peripheral vascular disease, unspecified: Secondary | ICD-10-CM

## 2014-11-16 DIAGNOSIS — Z941 Heart transplant status: Secondary | ICD-10-CM

## 2014-11-16 DIAGNOSIS — Z7952 Long term (current) use of systemic steroids: Secondary | ICD-10-CM

## 2014-11-16 DIAGNOSIS — Z87891 Personal history of nicotine dependence: Secondary | ICD-10-CM

## 2014-11-16 DIAGNOSIS — E1122 Type 2 diabetes mellitus with diabetic chronic kidney disease: Secondary | ICD-10-CM | POA: Diagnosis present

## 2014-11-16 DIAGNOSIS — Z992 Dependence on renal dialysis: Secondary | ICD-10-CM

## 2014-11-16 DIAGNOSIS — M79609 Pain in unspecified limb: Secondary | ICD-10-CM | POA: Diagnosis not present

## 2014-11-16 DIAGNOSIS — IMO0001 Reserved for inherently not codable concepts without codable children: Secondary | ICD-10-CM

## 2014-11-16 DIAGNOSIS — N186 End stage renal disease: Secondary | ICD-10-CM | POA: Diagnosis not present

## 2014-11-16 DIAGNOSIS — Z9225 Personal history of immunosupression therapy: Secondary | ICD-10-CM

## 2014-11-16 DIAGNOSIS — T45515A Adverse effect of anticoagulants, initial encounter: Secondary | ICD-10-CM | POA: Diagnosis present

## 2014-11-16 DIAGNOSIS — E440 Moderate protein-calorie malnutrition: Secondary | ICD-10-CM | POA: Diagnosis present

## 2014-11-16 DIAGNOSIS — I97618 Postprocedural hemorrhage and hematoma of a circulatory system organ or structure following other circulatory system procedure: Secondary | ICD-10-CM | POA: Diagnosis not present

## 2014-11-16 DIAGNOSIS — D649 Anemia, unspecified: Secondary | ICD-10-CM | POA: Diagnosis present

## 2014-11-16 DIAGNOSIS — T8130XA Disruption of wound, unspecified, initial encounter: Secondary | ICD-10-CM | POA: Diagnosis present

## 2014-11-16 DIAGNOSIS — Z22322 Carrier or suspected carrier of Methicillin resistant Staphylococcus aureus: Secondary | ICD-10-CM

## 2014-11-16 DIAGNOSIS — I25811 Atherosclerosis of native coronary artery of transplanted heart without angina pectoris: Secondary | ICD-10-CM | POA: Diagnosis present

## 2014-11-16 DIAGNOSIS — I120 Hypertensive chronic kidney disease with stage 5 chronic kidney disease or end stage renal disease: Secondary | ICD-10-CM | POA: Diagnosis present

## 2014-11-16 DIAGNOSIS — I10 Essential (primary) hypertension: Secondary | ICD-10-CM | POA: Diagnosis present

## 2014-11-16 DIAGNOSIS — Z86718 Personal history of other venous thrombosis and embolism: Secondary | ICD-10-CM

## 2014-11-16 DIAGNOSIS — E1129 Type 2 diabetes mellitus with other diabetic kidney complication: Secondary | ICD-10-CM | POA: Diagnosis present

## 2014-11-16 DIAGNOSIS — M898X9 Other specified disorders of bone, unspecified site: Secondary | ICD-10-CM | POA: Diagnosis present

## 2014-11-16 DIAGNOSIS — E11649 Type 2 diabetes mellitus with hypoglycemia without coma: Secondary | ICD-10-CM | POA: Diagnosis not present

## 2014-11-16 DIAGNOSIS — Z794 Long term (current) use of insulin: Secondary | ICD-10-CM

## 2014-11-16 DIAGNOSIS — N2581 Secondary hyperparathyroidism of renal origin: Secondary | ICD-10-CM | POA: Diagnosis present

## 2014-11-16 DIAGNOSIS — Z7901 Long term (current) use of anticoagulants: Secondary | ICD-10-CM

## 2014-11-16 LAB — CBC WITH DIFFERENTIAL/PLATELET
BASOS PCT: 0 % (ref 0–1)
Basophils Absolute: 0 10*3/uL (ref 0.0–0.1)
EOS PCT: 2 % (ref 0–5)
Eosinophils Absolute: 0.1 10*3/uL (ref 0.0–0.7)
HEMATOCRIT: 21.1 % — AB (ref 39.0–52.0)
Hemoglobin: 6.5 g/dL — CL (ref 13.0–17.0)
Lymphocytes Relative: 46 % (ref 12–46)
Lymphs Abs: 1.8 10*3/uL (ref 0.7–4.0)
MCH: 29.1 pg (ref 26.0–34.0)
MCHC: 30.8 g/dL (ref 30.0–36.0)
MCV: 94.6 fL (ref 78.0–100.0)
MONO ABS: 0.4 10*3/uL (ref 0.1–1.0)
Monocytes Relative: 9 % (ref 3–12)
NEUTROS ABS: 1.7 10*3/uL (ref 1.7–7.7)
Neutrophils Relative %: 43 % (ref 43–77)
Platelets: 104 10*3/uL — ABNORMAL LOW (ref 150–400)
RBC: 2.23 MIL/uL — AB (ref 4.22–5.81)
RDW: 18.7 % — ABNORMAL HIGH (ref 11.5–15.5)
WBC: 4 10*3/uL (ref 4.0–10.5)

## 2014-11-16 LAB — BASIC METABOLIC PANEL
ANION GAP: 10 (ref 5–15)
BUN: 27 mg/dL — ABNORMAL HIGH (ref 6–20)
CHLORIDE: 104 mmol/L (ref 101–111)
CO2: 26 mmol/L (ref 22–32)
CREATININE: 7.39 mg/dL — AB (ref 0.61–1.24)
Calcium: 8.6 mg/dL — ABNORMAL LOW (ref 8.9–10.3)
GFR calc non Af Amer: 7 mL/min — ABNORMAL LOW (ref >60)
GFR, EST AFRICAN AMERICAN: 8 mL/min — AB (ref >60)
Glucose, Bld: 67 mg/dL (ref 65–99)
Potassium: 3.2 mmol/L — ABNORMAL LOW (ref 3.5–5.1)
SODIUM: 140 mmol/L (ref 135–145)

## 2014-11-16 LAB — GLUCOSE, CAPILLARY
GLUCOSE-CAPILLARY: 78 mg/dL (ref 65–99)
Glucose-Capillary: 41 mg/dL — CL (ref 65–99)

## 2014-11-16 LAB — FERRITIN: Ferritin: 763 ng/mL — ABNORMAL HIGH (ref 24–336)

## 2014-11-16 LAB — RETICULOCYTES
RBC.: 2.15 MIL/uL — ABNORMAL LOW (ref 4.22–5.81)
RETIC COUNT ABSOLUTE: 68.8 10*3/uL (ref 19.0–186.0)
RETIC CT PCT: 3.2 % — AB (ref 0.4–3.1)

## 2014-11-16 LAB — I-STAT CHEM 8, ED
BUN: 29 mg/dL — AB (ref 6–20)
CREATININE: 7.2 mg/dL — AB (ref 0.61–1.24)
Calcium, Ion: 1.18 mmol/L (ref 1.13–1.30)
Chloride: 101 mmol/L (ref 101–111)
Glucose, Bld: 63 mg/dL — ABNORMAL LOW (ref 65–99)
HCT: 21 % — ABNORMAL LOW (ref 39.0–52.0)
Hemoglobin: 7.1 g/dL — ABNORMAL LOW (ref 13.0–17.0)
POTASSIUM: 3.1 mmol/L — AB (ref 3.5–5.1)
SODIUM: 139 mmol/L (ref 135–145)
TCO2: 25 mmol/L (ref 0–100)

## 2014-11-16 LAB — VITAMIN B12: Vitamin B-12: 949 pg/mL — ABNORMAL HIGH (ref 180–914)

## 2014-11-16 LAB — PROTIME-INR
INR: 3.2 — ABNORMAL HIGH (ref 0.00–1.49)
Prothrombin Time: 32.1 seconds — ABNORMAL HIGH (ref 11.6–15.2)

## 2014-11-16 LAB — LACTATE DEHYDROGENASE: LDH: 271 U/L — AB (ref 98–192)

## 2014-11-16 LAB — FOLATE: FOLATE: 61.3 ng/mL (ref >5.9)

## 2014-11-16 LAB — IRON AND TIBC
Iron: 74 ug/dL (ref 45–182)
Saturation Ratios: 31 % (ref 17.9–39.5)
TIBC: 237 ug/dL — AB (ref 250–450)
UIBC: 163 ug/dL

## 2014-11-16 LAB — PREPARE RBC (CROSSMATCH)

## 2014-11-16 MED ORDER — OXYCODONE HCL 5 MG PO TABS
5.0000 mg | ORAL_TABLET | ORAL | Status: DC | PRN
  Administered 2014-11-16 – 2014-11-27 (×13): 5 mg via ORAL
  Filled 2014-11-16 (×13): qty 1

## 2014-11-16 MED ORDER — SENNOSIDES-DOCUSATE SODIUM 8.6-50 MG PO TABS
1.0000 | ORAL_TABLET | Freq: Every evening | ORAL | Status: DC | PRN
  Administered 2014-12-01: 1 via ORAL
  Filled 2014-11-16 (×3): qty 1

## 2014-11-16 MED ORDER — CARVEDILOL 6.25 MG PO TABS
6.2500 mg | ORAL_TABLET | Freq: Every day | ORAL | Status: DC
  Administered 2014-11-16 – 2014-12-01 (×16): 6.25 mg via ORAL
  Filled 2014-11-16 (×18): qty 1

## 2014-11-16 MED ORDER — FAMOTIDINE 10 MG PO TABS
10.0000 mg | ORAL_TABLET | Freq: Every day | ORAL | Status: DC
  Administered 2014-11-16 – 2014-12-01 (×16): 10 mg via ORAL
  Filled 2014-11-16 (×18): qty 1

## 2014-11-16 MED ORDER — INSULIN ASPART 100 UNIT/ML ~~LOC~~ SOLN: 0.0000 [IU] | Freq: Every day | SUBCUTANEOUS | Status: DC

## 2014-11-16 MED ORDER — SODIUM CHLORIDE 0.9 % IV SOLN
INTRAVENOUS | Status: DC
  Administered 2014-11-17: 04:00:00 via INTRAVENOUS

## 2014-11-16 MED ORDER — INSULIN GLARGINE 100 UNIT/ML ~~LOC~~ SOLN
20.0000 [IU] | Freq: Every morning | SUBCUTANEOUS | Status: DC
  Administered 2014-11-17 – 2014-11-22 (×5): 20 [IU] via SUBCUTANEOUS
  Filled 2014-11-16 (×6): qty 0.2

## 2014-11-16 MED ORDER — INSULIN ASPART 100 UNIT/ML ~~LOC~~ SOLN
0.0000 [IU] | Freq: Three times a day (TID) | SUBCUTANEOUS | Status: DC
  Administered 2014-11-17: 1 [IU] via SUBCUTANEOUS
  Administered 2014-11-22 – 2014-11-23 (×2): 2 [IU] via SUBCUTANEOUS
  Administered 2014-11-29: 1 [IU] via SUBCUTANEOUS

## 2014-11-16 MED ORDER — INSULIN ASPART 100 UNIT/ML ~~LOC~~ SOLN
3.0000 [IU] | Freq: Three times a day (TID) | SUBCUTANEOUS | Status: DC
  Administered 2014-11-17 – 2014-11-23 (×9): 3 [IU] via SUBCUTANEOUS

## 2014-11-16 MED ORDER — DARBEPOETIN ALFA 200 MCG/0.4ML IJ SOSY
PREFILLED_SYRINGE | INTRAMUSCULAR | Status: AC
  Filled 2014-11-16: qty 0.4

## 2014-11-16 MED ORDER — ISOSORBIDE DINITRATE ER 40 MG PO TBCR
40.0000 mg | EXTENDED_RELEASE_TABLET | Freq: Three times a day (TID) | ORAL | Status: DC
  Administered 2014-11-16: 40 mg via ORAL
  Filled 2014-11-16 (×6): qty 1

## 2014-11-16 MED ORDER — ACETAMINOPHEN 325 MG PO TABS: 650.0000 mg | ORAL_TABLET | Freq: Four times a day (QID) | ORAL | Status: DC | PRN

## 2014-11-16 MED ORDER — COLESEVELAM HCL 625 MG PO TABS
3750.0000 mg | ORAL_TABLET | Freq: Every day | ORAL | Status: DC
  Administered 2014-11-17 – 2014-12-02 (×15): 3750 mg via ORAL
  Filled 2014-11-16 (×16): qty 6

## 2014-11-16 MED ORDER — SODIUM CHLORIDE 0.9 % IV SOLN
Freq: Once | INTRAVENOUS | Status: AC
  Administered 2014-11-21: 09:00:00 via INTRAVENOUS

## 2014-11-16 MED ORDER — ONDANSETRON HCL 4 MG/2ML IJ SOLN: 4.0000 mg | Freq: Four times a day (QID) | INTRAMUSCULAR | Status: DC | PRN

## 2014-11-16 MED ORDER — RENA-VITE PO TABS
1.0000 | ORAL_TABLET | Freq: Every day | ORAL | Status: DC
  Administered 2014-11-16 – 2014-12-01 (×16): 1 via ORAL
  Filled 2014-11-16 (×19): qty 1

## 2014-11-16 MED ORDER — FAMOTIDINE 10 MG PO CHEW: 10.0000 mg | CHEWABLE_TABLET | Freq: Every day | ORAL | Status: DC

## 2014-11-16 MED ORDER — GLUCOSE 40 % PO GEL
ORAL | Status: AC
  Administered 2014-11-16: 37.5 g
  Filled 2014-11-16: qty 1

## 2014-11-16 MED ORDER — ACETAMINOPHEN 650 MG RE SUPP: 650.0000 mg | Freq: Four times a day (QID) | RECTAL | Status: DC | PRN

## 2014-11-16 MED ORDER — ONDANSETRON HCL 4 MG PO TABS: 4.0000 mg | ORAL_TABLET | Freq: Four times a day (QID) | ORAL | Status: DC | PRN

## 2014-11-16 MED ORDER — CALCITRIOL 0.5 MCG PO CAPS
1.0000 ug | ORAL_CAPSULE | ORAL | Status: DC
  Filled 2014-11-16: qty 2

## 2014-11-16 MED ORDER — DARBEPOETIN ALFA 200 MCG/0.4ML IJ SOSY
200.0000 ug | PREFILLED_SYRINGE | INTRAMUSCULAR | Status: DC
  Administered 2014-11-16 – 2014-11-30 (×3): 200 ug via INTRAVENOUS
  Filled 2014-11-16 (×2): qty 0.4

## 2014-11-16 MED ORDER — ATORVASTATIN CALCIUM 40 MG PO TABS
40.0000 mg | ORAL_TABLET | Freq: Every day | ORAL | Status: DC
  Administered 2014-11-16 – 2014-12-02 (×15): 40 mg via ORAL
  Filled 2014-11-16 (×17): qty 1

## 2014-11-16 MED ORDER — PREDNISONE 5 MG PO TABS
5.0000 mg | ORAL_TABLET | Freq: Every day | ORAL | Status: DC
  Administered 2014-11-17 – 2014-12-02 (×15): 5 mg via ORAL
  Filled 2014-11-16 (×19): qty 1

## 2014-11-16 MED ORDER — CALCIUM ACETATE (PHOS BINDER) 667 MG PO CAPS
667.0000 mg | ORAL_CAPSULE | Freq: Three times a day (TID) | ORAL | Status: DC
  Administered 2014-11-17 – 2014-11-24 (×18): 667 mg via ORAL
  Filled 2014-11-16 (×25): qty 1

## 2014-11-16 MED ORDER — SIROLIMUS 1 MG PO TABS
1.5000 mg | ORAL_TABLET | Freq: Every day | ORAL | Status: DC
  Administered 2014-11-17 – 2014-11-26 (×8): 1.5 mg via ORAL
  Filled 2014-11-16 (×14): qty 2

## 2014-11-16 MED ORDER — AZATHIOPRINE 50 MG PO TABS
75.0000 mg | ORAL_TABLET | Freq: Every morning | ORAL | Status: DC
  Administered 2014-11-17 – 2014-12-02 (×14): 75 mg via ORAL
  Filled 2014-11-16 (×17): qty 2

## 2014-11-16 NOTE — ED Notes (Addendum)
Nephrology PA at the bedside.

## 2014-11-16 NOTE — ED Notes (Addendum)
Pt in c/o weakness and fatigue, states he was told at dialysis that his hgb was low, told to come here for blood transfusion, alert and oriented, no distress noted- pt due for dialysis today

## 2014-11-16 NOTE — Procedures (Signed)
I have personally attended this patient's dialysis session.   Bath changed to [employer] for K of 3.2 For 2 units PRBC's for Hb 6.5 No heparin (on coumadin with INR 3) Access = TDC  [name], MD [place] Pager 11/16/2014, 4:47 PM

## 2014-11-16 NOTE — ED Provider Notes (Signed)
The patient is a 69 year old male, he has a history of end-stage renal disease on dialysis, presents with low hemoglobin, generalized weakness, on exam the patient has a soft abdomen, clear heart and lung sounds, borderline tachycardia, mild swelling of the bilateral upper extremities. Dialysis access appears clean, no drainage or pus, hemoglobin reviewed and is very low, will need admission for transfusion.  Medical screening examination/treatment/procedure(s) were conducted as a shared visit with non-physician practitioner(s) and myself.  I personally evaluated the patient during the encounter.  Clinical Impression:   Final diagnoses:  Symptomatic anemia  ESRD (end stage renal disease)       Noemi Chapel, MD 11/16/14 1818

## 2014-11-16 NOTE — Progress Notes (Signed)
PRELIMINARY RESULTS BY TECH -  Right Lower Ext. Limited Arterial Duplex Completed. A patent AVF with a large, irregular, hypoechoic structure  in the right groin measuring approximately 6.4 x 5.6 x 3.3 cm, probably hematoma. Oda Cogan, BS, RDMS, RVT

## 2014-11-16 NOTE — Consult Note (Signed)
[facility] Renal Consultation Note  Indication for Consultation:  Management of ESRD/hemodialysis; anemia, hypertension/volume and secondary hyperparathyroidism  HPI: Brandon Sharp is a 69 y.o. male  today presented to op HD center (chronic HD MWF [place]) "too weak to get out of car for HD". Hgb 6.9 obtained  6/20 ->reported today at kidney center. Sent to the ED for further evaluation. Reports severe malaise and "too weak to do average activities"   He denies melena . Denies fevers, chills, chest pain sob, cough . Has normal appetite . Chronic swelling in R arm "about normal".  Noted he did have a R fem. [place] placed 10/20/14 Dr. Bridgett Larsson  After  R arm clotted and AVGG deemed not salvageable  .Marland Kitchen  He has a past h/o heart transplant (on chronic AZA/prednisone and rapamune), coumadin for L SCV DVT, HLD, multiple failed dialysis accesses s/p placement of a left thigh AVG 10/20/14 (with a dehisced wound - not yet evaluated by VVS). Has been using a  L ij perm cath without issues   Hgb trending for reference 4/27=9.4 5/4 =9.0 5/11 = 8.6 5/19=8.2 5/25=8.1 6/01= 8.2  6/8=7.4  6/15 7.3  Transfused 1 unit at [facility] 6/17 8.2 (ED)  6/20 6.9 6/22 6.5 (ER)  On 200 mcg Aranesp weekly at dialysis with last TFS  40% 10/19/14. Stool cards have been done in the last month - heme negative  Duplex of his new thigh AVG in the ED demonstrated large, irregular, hypoechoic structure in the right groin measuring approximately 6.4 x 5.6 x 3.3 cm, probably hematoma.    Past Medical History  Diagnosis Date  . Diabetes mellitus   . Hypertension   . Myocardial infarction 1985; 1990  . Angina   . Dysrhythmia   . DVT (deep venous thrombosis) ~ 12/2010    LLE  . Anemia   . Blood transfusion 06/1999    post heart transplant  . Peripheral vascular disease   . Renal failure     Hemodialysis MWF last 2 years, sse dr [name] nephrology, goes to [facility]  . DVT (deep venous  thrombosis) 08/25/2014    Nearly occluded R IJ, and L subclavian DVT  . CHF (congestive heart failure) 2001    beffore transplant; had AICD that was explanted 07/19/99 following successful heart transplant  . Coronary artery disease     transplant cardiologist Dr. Corky Mull [facility])  . Pneumonia     Past Surgical History  Procedure Laterality Date  . Nephrectomy transplanted organ  2008  . Av fistula repair      rt. a=fore arm fistula  . Av fistula placement  ~ 01/2010    "this was my 2nd fistula placement"  . Heart transplant  06/1999  . Colonoscopy  03/17/2012    Procedure: COLONOSCOPY;  Surgeon: Lear Ng, MD;  Location: [facility];  Service: Endoscopy;  Laterality: N/A;  . Coronary artery bypass graft  1990    CABG X3  . Colonoscopy with propofol N/A 12/14/2013    Procedure: COLONOSCOPY WITH PROPOFOL;  Surgeon: Lear Ng, MD;  Location: [facility];  Service: Endoscopy;  Laterality: N/A;  . Video bronchoscopy Bilateral 04/14/2014    Procedure: VIDEO BRONCHOSCOPY WITH FLUORO;  Surgeon: Collene Gobble, MD;  Location: [facility];  Service: Cardiopulmonary;  Laterality: Bilateral;  . Shuntogram Right 05/09/2014    Procedure: FISTULOGRAM;  Surgeon: Conrad [place], MD;  Location: [facility] CATH LAB;  Service: Cardiovascular;  Laterality: Right;  .  Coronary angioplasty with stent placement  1985; 1990    prior stents in the 80's and 90's then CABG; s/p heart transplant 2001 s/p BMS mid LAD and proximal RCA 04/21/13 [facility])  . Av fistula placement Right 10/20/2014    Procedure: INSERTION OF ARTERIOVENOUS (AV) GORE-TEX GRAFT THIGH;  Surgeon: Conrad [place], MD;  Location: [facility] OR;  Service: Vascular;  Laterality: Right;      Family History  Problem Relation Age of Onset  . Heart disease Mother   . Hyperlipidemia Mother   . Hypertension Mother   . Diabetes Mother   . Hyperlipidemia Father   . Hypertension Father   . Heart disease Father   . Diabetes Brother   . Heart  disease Brother     Heart Disease before age 64  . Hyperlipidemia Brother   . Hypertension Brother   . Heart attack Brother       reports that he quit smoking about 26 years ago. His smoking use included Cigarettes. He has a 20 pack-year smoking history. He has never used smokeless tobacco. He reports that he drinks alcohol. He reports that he does not use illicit drugs.   Allergies  Allergen Reactions  . Lisinopril Swelling    Lips and tongue swell  . Niacin And Related Other (See Comments)    unknown  . Norvasc [Amlodipine Besylate] Rash    Flushing  . Penicillins Rash    Prior to Admission medications   Medication Sig Start Date End Date Taking? Authorizing Provider  atorvastatin (LIPITOR) 40 MG tablet Take 40 mg by mouth daily.     Yes Historical Provider, MD  azaTHIOprine (IMURAN) 50 MG tablet Take 75 mg by mouth every morning.    Yes Historical Provider, MD  b complex-vitamin c-folic acid (NEPHRO-VITE) 0.8 MG TABS tablet Take 1 tablet by mouth at bedtime.   Yes Historical Provider, MD  calcium acetate (PHOSLO) 667 MG capsule Take 667 mg by mouth 3 (three) times daily with meals.  11/01/12  Yes Historical Provider, MD  carvedilol (COREG) 6.25 MG tablet Take 1 tablet (6.25 mg total) by mouth at bedtime. 04/15/14  Yes Geradine Girt, DO  colesevelam (WELCHOL) 625 MG tablet Take 3,750 mg by mouth daily. Takes 6 tabs daily   Yes Historical Provider, MD  famotidine (PEPCID AC) 10 MG chewable tablet Chew 10 mg by mouth at bedtime.    Yes Historical Provider, MD  HUMALOG KWIKPEN 100 UNIT/ML SOPN Inject 4-10 Units into the skin 2 (two) times daily. Per sliding scale 11/17/12  Yes Historical Provider, MD  insulin glargine (LANTUS) 100 UNIT/ML injection Inject 20 Units into the skin every morning.   Yes Historical Provider, MD  isosorbide dinitrate (ISOCHRON) 40 MG CR tablet Take 40 mg by mouth every 8 (eight) hours. 10/03/14  Yes Historical Provider, MD  predniSONE (DELTASONE) 5 MG tablet Take  1 tablet (5 mg total) by mouth daily with breakfast. 08/29/14  Yes Orson Eva, MD  Sirolimus 0.5 MG TABS Take 3 tablets by mouth daily. 10/10/14  Yes Historical Provider, MD  warfarin (COUMADIN) 5 MG tablet Take 1 tablet (5 mg total) by mouth daily. 08/29/14  Yes Orson Eva, MD      Results for orders placed or performed during the hospital encounter of 11/16/14 (from the past 48 hour(s))  CBC with Differential     Status: Abnormal   Collection Time: 11/16/14 11:57 AM  Result Value Ref Range   WBC 4.0 4.0 - 10.5 K/uL  RBC 2.23 (L) 4.22 - 5.81 MIL/uL   Hemoglobin 6.5 (LL) 13.0 - 17.0 g/dL    Comment: REPEATED TO VERIFY CRITICAL RESULT CALLED TO, READ BACK BY AND VERIFIED WITH: H.RINGLEY,RN 11/16/14 1236 BY BSLADE    HCT 21.1 (L) 39.0 - 52.0 %   MCV 94.6 78.0 - 100.0 fL   MCH 29.1 26.0 - 34.0 pg   MCHC 30.8 30.0 - 36.0 g/dL   RDW 18.7 (H) 11.5 - 15.5 %   Platelets 104 (L) 150 - 400 K/uL    Comment: PLATELET COUNT CONFIRMED BY SMEAR   Neutrophils Relative % 43 43 - 77 %   Lymphocytes Relative 46 12 - 46 %   Monocytes Relative 9 3 - 12 %   Eosinophils Relative 2 0 - 5 %   Basophils Relative 0 0 - 1 %   Neutro Abs 1.7 1.7 - 7.7 K/uL   Lymphs Abs 1.8 0.7 - 4.0 K/uL   Monocytes Absolute 0.4 0.1 - 1.0 K/uL   Eosinophils Absolute 0.1 0.0 - 0.7 K/uL   Basophils Absolute 0.0 0.0 - 0.1 K/uL   RBC Morphology BASOPHILIC STIPPLING     Comment: ELLIPTOCYTES POLYCHROMASIA PRESENT TEARDROP CELLS   Type and screen     Status: None (Preliminary result)   Collection Time: 11/16/14 11:57 AM  Result Value Ref Range   ABO/RH(D) O POS    Antibody Screen NEG    Sample Expiration 11/19/2014    Unit Number [id number]    Blood Component Type RED CELLS,LR    Unit division 00    Status of Unit ALLOCATED    Transfusion Status OK TO TRANSFUSE    Crossmatch Result Compatible    Unit Number [id number]    Blood Component Type RED CELLS,LR    Unit division 00    Status of Unit ALLOCATED     Transfusion Status OK TO TRANSFUSE    Crossmatch Result Compatible   I-Stat Chem 8, ED     Status: Abnormal   Collection Time: 11/16/14 12:05 PM  Result Value Ref Range   Sodium 139 135 - 145 mmol/L   Potassium 3.1 (L) 3.5 - 5.1 mmol/L   Chloride 101 101 - 111 mmol/L   BUN 29 (H) 6 - 20 mg/dL   Creatinine, Ser 7.20 (H) 0.61 - 1.24 mg/dL   Glucose, Bld 63 (L) 65 - 99 mg/dL   Calcium, Ion 1.18 1.13 - 1.30 mmol/L   TCO2 25 0 - 100 mmol/L   Hemoglobin 7.1 (L) 13.0 - 17.0 g/dL   HCT 21.0 (L) 39.0 - 52.0 %  Prepare RBC     Status: None   Collection Time: 11/16/14 12:23 PM  Result Value Ref Range   Order Confirmation ORDER PROCESSED BY BLOOD BANK   Reticulocytes     Status: Abnormal   Collection Time: 11/16/14 12:26 PM  Result Value Ref Range   Retic Ct Pct 3.2 (H) 0.4 - 3.1 %   RBC. 2.15 (L) 4.22 - 5.81 MIL/uL   Retic Count, Manual 68.8 19.0 - 186.0 K/uL  Basic metabolic panel     Status: Abnormal   Collection Time: 11/16/14 12:26 PM  Result Value Ref Range   Sodium 140 135 - 145 mmol/L   Potassium 3.2 (L) 3.5 - 5.1 mmol/L   Chloride 104 101 - 111 mmol/L   CO2 26 22 - 32 mmol/L   Glucose, Bld 67 65 - 99 mg/dL   BUN 27 (H) 6 - 20 mg/dL  Creatinine, Ser 7.39 (H) 0.61 - 1.24 mg/dL   Calcium 8.6 (L) 8.9 - 10.3 mg/dL   GFR calc non Af Amer 7 (L) >60 mL/min   GFR calc Af Amer 8 (L) >60 mL/min    Comment: (NOTE) The eGFR has been calculated using the CKD EPI equation. This calculation has not been validated in all clinical situations. eGFR's persistently <60 mL/min signify possible Chronic Kidney Disease.    Anion gap 10 5 - 15  Protime-INR     Status: Abnormal   Collection Time: 11/16/14 12:26 PM  Result Value Ref Range   Prothrombin Time 32.1 (H) 11.6 - 15.2 seconds   INR 3.20 (H) 0.00 - 1.49     ROS: see hpi for positives   Physical Exam: Filed Vitals:   11/16/14 1300  BP: 132/53  Pulse: 81  Temp:   Resp: 17     General: alert NAD /BM/ NAD Ox3 HEENT: [place] , MMM,  Nonicteric Neck: supple, no jvd Heart: RRR , 1/6 sem lsb, no rub or gallop Lungs: CTA bilat/ non labored breathing Abdomen: BS pos / mid line bruit/ soft , nontender, nondistended Extremities:  R Upper arm swelling  Chronic/ mild swelling at R upper thigh avgg nontender/no pedal edema. Wound dehiscense in right groin with yellowish exudate from AVG incision Skin: no overt rash or pedal ulcers Neuro: alert, OX4 , appropriate, moves all extrem.  Dialysis Access: Pos. Bruit R FEM avgg/ R IJ Perm cath nontender no swelling   Dialysis Orders: Center: [facility]  on MWF . EDW 72kg HD Bath 2k,2.0ca  Time 4hr Heparin 3000. Access L ij perm cath/  R fem avgg insert 10/20/14 ,    Hectorol 4 mcg IV/HD , Aranesp 200  Units IV/ q wed HD  Venofer  0    Assessment/Plan 1. Anemia - ?? Combination CKD,/ Recent Fem Avgg procedure/Card TX meds?/ coumadin issues?/ VS ??other transfuse 2 unit s prbcs on hd. Fu cbc/ admit team wu 2. ESRD -  MWF schedule hd today  With transfusion Use 4K bath with current low K 3. Hypertension/volume  - stable bp136/54  In ER 4. Anemia  - as above ,Max aranesp weekly wed hd Continue 200/week Aranesp 5. Metabolic bone disease -  Iv Hectorol/ binder phoslo  6. Card Tx- po meds Imuran, Rapamune, Prednisone 7. 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 Dm type 2- per admit  Ernest Haber, PA-C [place] [phone] 11/16/2014, 1:19 PM   I have seen and examined this patient and agree with plan as outlined in the above note with highlighted additions. Pt has been on maximum outpt doses of Aranesp with a steady decline in Hb over the past month - even lower today DESPITE transfusion of 1 unit of PRBC's a week ago.  Stools have been heme negative. He does have a perigraft (probable) hematoma although VVS does not think this is the sole etiology for his anemia. His iron stores have been adequate. He does have non-healed wound in his groin - this chronic  inflammation may be blunting his response to Aranesp. As for marrow suppression from his heart transplant meds, the doses have actually been LOWERED in the past couple of months because of issues he was having with recurrent PNA so doubt this is playing big role.  Probably should check LDH and haptoglobin to make sure no hemolysis although low suspicion for this. I think VVS should take a look  at his groin wound as well.  Jacquelin Krajewski B,MD 11/16/2014 4:19 PM

## 2014-11-16 NOTE — ED Notes (Signed)
Type and screen sent for hold clot if needed

## 2014-11-16 NOTE — ED Notes (Signed)
Doppler at the bedside for right lower extremity duplex to check new graft that is 50 weeks old.

## 2014-11-16 NOTE — ED Notes (Signed)
Blood bank called and states blood is ready. Order to transfuse is cancelled at this time d/t pt needing to be dialyzed as well.

## 2014-11-16 NOTE — ED Provider Notes (Signed)
CSN: [id number]     Arrival date & time 11/16/14  1057 History   First MD Initiated Contact with Patient 11/16/14 1157     Chief Complaint  Patient presents with  . Abnormal Lab   Brandon Sharp is a 69 y.o. male with a history of diabetes, end-stage renal disease on hemodialysis, DVT, and CHF who presents to the ED complaining of feeling lightheaded and weak and he thinks his hgb is low. Patient has required a blood transfusion about 1.5 weeks ago for symptomatic anemia. He receives dialysis on a Monday, Wednesday, Friday schedule. It was reported that his hemoglobin was 7.2 at his dialysis last Friday. Patient went to the emergency department and was found to his hgb to be 8.2. It was decided not to do a blood transfusion at this time. The patient reports he had a repeat hemoglobin done at his dialysis clinic days ago and it was reported his hemoglobin was 6.2 and he was directed to the emergency department. Patient came to the emergency department today prior to receiving his dialysis treatment today. 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 The patient also had an AV graft placed in his right thigh 4 weeks ago, 10/20/14, by Dr. Bridgett Larsson. Patient reports he is still tender in this area. His last complete dialysis was Monday, 2 days ago. Patient reports had a colonoscopy about 1 year ago. He reports a Hemoccult was negative and the ED 4 days ago. The patient denies any urine output. The patient denies fevers, chills, shortness of breath, chest pain, abdominal pain, nausea, vomiting, diarrhea, headache, rashes, bleeding gums, hematemesis, hematochezia, melena or LOC.   (Consider location/radiation/quality/duration/timing/severity/associated sxs/prior Treatment) HPI  Past Medical History  Diagnosis Date  . Diabetes mellitus   . Hypertension   . Myocardial infarction 1985; 1990  . Angina    . Dysrhythmia   . DVT (deep venous thrombosis) ~ 12/2010    LLE  . Anemia   . Blood transfusion 06/1999    post heart transplant  . Peripheral vascular disease   . Renal failure     Hemodialysis MWF last 2 years, sse dr [name] nephrology, goes to [facility]  . DVT (deep venous thrombosis) 08/25/2014    Nearly occluded R IJ, and L subclavian DVT  . CHF (congestive heart failure) 2001    beffore transplant; had AICD that was explanted 07/19/99 following successful heart transplant  . Coronary artery disease     transplant cardiologist Dr. Corky Mull [facility])  . Pneumonia    Past Surgical History  Procedure Laterality Date  . Nephrectomy transplanted organ  2008  . Av fistula repair      rt. a=fore arm fistula  . Av fistula placement  ~ 01/2010    "this was my 2nd fistula placement"  . Heart transplant  06/1999  . Colonoscopy  03/17/2012    Procedure: COLONOSCOPY;  Surgeon: Lear Ng, MD;  Location: [facility] ENDOSCOPY;  Service: Endoscopy;  Laterality: N/A;  . Coronary artery bypass graft  1990    CABG X3  . Colonoscopy with propofol N/A 12/14/2013    Procedure: COLONOSCOPY WITH PROPOFOL;  Surgeon: Lear Ng, MD;  Location: [facility] ENDOSCOPY;  Service: Endoscopy;  Laterality: N/A;  . Video bronchoscopy Bilateral 04/14/2014    Procedure: VIDEO BRONCHOSCOPY WITH FLUORO;  Surgeon: Collene Gobble, MD;  Location:  [facility];  Service: Cardiopulmonary;  Laterality: Bilateral;  . Shuntogram Right 05/09/2014    Procedure: FISTULOGRAM;  Surgeon: Conrad Newcastle, MD;  Location: [facility] CATH LAB;  Service: Cardiovascular;  Laterality: Right;  . Coronary angioplasty with stent placement  1985; 1990    prior stents in the 80's and 90's then CABG; s/p heart transplant 2001 s/p BMS mid LAD and proximal RCA 04/21/13 [facility])  . Av fistula placement Right 10/20/2014    Procedure: INSERTION OF ARTERIOVENOUS (AV) GORE-TEX GRAFT THIGH;  Surgeon: Conrad Centerview, MD;  Location: [facility] OR;   Service: Vascular;  Laterality: Right;   Family History  Problem Relation Age of Onset  . Heart disease Mother   . Hyperlipidemia Mother   . Hypertension Mother   . Diabetes Mother   . Hyperlipidemia Father   . Hypertension Father   . Heart disease Father   . Diabetes Brother   . Heart disease Brother     Heart Disease before age 58  . Hyperlipidemia Brother   . Hypertension Brother   . Heart attack Brother    History  Substance Use Topics  . Smoking status: Former Smoker -- 1.00 packs/day for 20 years    Types: Cigarettes    Quit date: 05/27/1988  . Smokeless tobacco: Never Used  . Alcohol Use: Yes     Comment: "Holidays"    Review of Systems  Constitutional: Positive for fatigue. Negative for fever and chills.  HENT: Negative for congestion and sore throat.   Eyes: Negative for visual disturbance.  Respiratory: Negative for cough, shortness of breath and wheezing.   Cardiovascular: Positive for palpitations. Negative for chest pain.  Gastrointestinal: Negative for nausea, vomiting, abdominal pain, diarrhea and blood in stool.  Musculoskeletal: Negative for back pain and neck pain.  Skin: Negative for rash.  Neurological: Positive for light-headedness. Negative for dizziness, syncope, weakness, numbness and headaches.      Allergies  Lisinopril; Niacin and related; Norvasc; and Penicillins  Home Medications   Prior to Admission medications   Medication Sig Start Date End Date Taking? Authorizing Provider  atorvastatin (LIPITOR) 40 MG tablet Take 40 mg by mouth daily.     Yes Historical Provider, MD  azaTHIOprine (IMURAN) 50 MG tablet Take 75 mg by mouth every morning.    Yes Historical Provider, MD  b complex-vitamin c-folic acid (NEPHRO-VITE) 0.8 MG TABS tablet Take 1 tablet by mouth at bedtime.   Yes Historical Provider, MD  calcium acetate (PHOSLO) 667 MG capsule Take 667 mg by mouth 3 (three) times daily with meals.  11/01/12  Yes Historical Provider, MD   carvedilol (COREG) 6.25 MG tablet Take 1 tablet (6.25 mg total) by mouth at bedtime. 04/15/14  Yes Geradine Girt, DO  colesevelam (WELCHOL) 625 MG tablet Take 3,750 mg by mouth daily. Takes 6 tabs daily   Yes Historical Provider, MD  famotidine (PEPCID AC) 10 MG chewable tablet Chew 10 mg by mouth at bedtime.    Yes Historical Provider, MD  HUMALOG KWIKPEN 100 UNIT/ML SOPN Inject 4-10 Units into the skin 2 (two) times daily. Per sliding scale 11/17/12  Yes Historical Provider, MD  insulin glargine (LANTUS) 100 UNIT/ML injection Inject 20 Units into the skin every morning.   Yes Historical Provider, MD  isosorbide dinitrate (ISOCHRON) 40 MG CR tablet Take 40 mg by mouth every 8 (eight) hours. 10/03/14  Yes Historical Provider, MD  predniSONE (DELTASONE) 5 MG tablet Take 1 tablet (5 mg total) by mouth daily with  breakfast. 08/29/14  Yes Orson Eva, MD  Sirolimus 0.5 MG TABS Take 3 tablets by mouth daily. 10/10/14  Yes Historical Provider, MD  warfarin (COUMADIN) 5 MG tablet Take 1 tablet (5 mg total) by mouth daily. 08/29/14  Yes David Tat, MD   BP 158/53 mmHg  Pulse 85  Temp(Src) 97.8 F (36.6 C) (Oral)  Resp 16  Wt 158 lb 11.7 oz (72 kg)  SpO2 99% Physical Exam  Constitutional: He is oriented to person, place, and time. He appears well-developed and well-nourished. No distress.  Nontoxic appearing.  HENT:  Head: Normocephalic and atraumatic.  Right Ear: External ear normal.  Left Ear: External ear normal.  Mouth/Throat: Oropharynx is clear and moist. No oropharyngeal exudate.  Eyes: Conjunctivae are normal. Pupils are equal, round, and reactive to light. Right eye exhibits no discharge. Left eye exhibits no discharge.  Neck: Neck supple. No JVD present. No tracheal deviation present.  Cardiovascular: Normal rate, regular rhythm, normal heart sounds and intact distal pulses.  Exam reveals no gallop and no friction rub.   No murmur heard. Bilateral radial and dorsalis pedis pulses are intact.   Pulmonary/Chest: Effort normal and breath sounds normal. No respiratory distress. He has no wheezes. He has no rales.  Abdominal: Soft. Bowel sounds are normal. He exhibits no distension. There is no tenderness. There is no guarding.  Abdomen is soft and nontender to palpation.  Genitourinary:  Surgical incision site to the patient's right thigh which is tender to palpation and mildly edematous. No overlying erythema or discharge.  Musculoskeletal: He exhibits edema.  Patient has bilateral hand and arm edema which he reports is due to his DVT diagnosis.  Lymphadenopathy:    He has no cervical adenopathy.  Neurological: He is alert and oriented to person, place, and time. Coordination normal.  Skin: Skin is warm and dry. No rash noted. He is not diaphoretic. No erythema. No pallor.  Psychiatric: He has a normal mood and affect. His behavior is normal.  Nursing note and vitals reviewed.   ED Course  Procedures (including critical care time) Labs Review Labs Reviewed  CBC WITH DIFFERENTIAL/PLATELET - Abnormal; Notable for the following:    RBC 2.23 (*)    Hemoglobin 6.5 (*)    HCT 21.1 (*)    RDW 18.7 (*)    Platelets 104 (*)    All other components within normal limits  VITAMIN B12 - Abnormal; Notable for the following:    Vitamin B-12 949 (*)    All other components within normal limits  IRON AND TIBC - Abnormal; Notable for the following:    TIBC 237 (*)    All other components within normal limits  FERRITIN - Abnormal; Notable for the following:    Ferritin 763 (*)    All other components within normal limits  RETICULOCYTES - Abnormal; Notable for the following:    Retic Ct Pct 3.2 (*)    RBC. 2.15 (*)    All other components within normal limits  BASIC METABOLIC PANEL - Abnormal; Notable for the following:    Potassium 3.2 (*)    BUN 27 (*)    Creatinine, Ser 7.39 (*)    Calcium 8.6 (*)    GFR calc non Af Amer 7 (*)    GFR calc Af Amer 8 (*)    All other components  within normal limits  PROTIME-INR - Abnormal; Notable for the following:    Prothrombin Time 32.1 (*)    INR 3.20 (*)  All other components within normal limits  I-STAT CHEM 8, ED - Abnormal; Notable for the following:    Potassium 3.1 (*)    BUN 29 (*)    Creatinine, Ser 7.20 (*)    Glucose, Bld 63 (*)    Hemoglobin 7.1 (*)    HCT 21.0 (*)    All other components within normal limits  FOLATE  TYPE AND SCREEN  PREPARE RBC (CROSSMATCH)    Imaging Review No results found.   EKG Interpretation None      Filed Vitals:   11/16/14 1445 11/16/14 1500 11/16/14 1515 11/16/14 1530  BP: 133/45 136/47 145/52 158/53  Pulse: 85 85 85 85  Temp:      TempSrc:      Resp: 13 16 20 16   Weight:      SpO2: 97% 99% 98% 99%     MDM   Meds given in ED:  Medications  Darbepoetin Alfa (ARANESP) injection 200 mcg (not administered)  calcitRIOL (ROCALTROL) capsule 1 mcg (not administered)    New Prescriptions   No medications on file    Final diagnoses:  Symptomatic anemia  ESRD (end stage renal disease)    This is a 69 y.o. male with a history of diabetes, end-stage renal disease on hemodialysis, DVT, and CHF who presents to the ED complaining of feeling lightheaded and weak and he thinks his hgb is low. Patient has required a blood transfusion about 1.5 weeks ago for symptomatic anemia. He receives dialysis on a Monday, Wednesday, Friday schedule. It was reported that his hemoglobin was 7.2 at his dialysis last Friday. Patient went to the emergency department and was found to his hgb to be 8.2. It was decided not to do a blood transfusion at this time. The patient reports he had a repeat hemoglobin done at his dialysis clinic days ago and it was reported his hemoglobin was 6.2 and he was directed to the emergency department. Patient came to the emergency department today prior to receiving his dialysis treatment today. 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 Patient had and AVF placed in his right groin by Dr. Bridgett Larsson 4 weeks ago on 10/20/2014. The patient reports feeling very tender in this area still. Patient denies melena, hematemesis, or hematochezia. On exam patient is afebrile nontoxic appearing. Patient's abdomen is soft and nontender to palpation. Patient's right groin is tender to palpation with mild edema over his surgical incision scar. 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 Patient's hemoglobin returned at 6.5 with a hematocrit of 21.1. His INR is 3.20. BMP shows a potassium of 3.2, sodium of 140. Patient typed and screened and 2 units prepared for transfusion. Plan is for right arterial ultrasound and admission for blood transfusion and dialysis. Nephrology saw the patient and put in orders for dialysis and blood transfusion and plans for medical admission. Patient still awaiting ultrasound. Due to delays in obtaining ultrasound lightheaded consulted for admission. Patient was accepted for admission by Dr. Jerilee Hoh.   This patient was discussed with and evaluated by Dr. Sabra Heck who agrees with assessment and plan.    Waynetta Pean, PA-C 11/16/14 1620  Noemi Chapel, MD 11/16/14 [phone]

## 2014-11-16 NOTE — ED Notes (Signed)
Will, PA at the bedside.  

## 2014-11-16 NOTE — ED Notes (Signed)
ETA of doppler study 30 minutes after calling department.

## 2014-11-16 NOTE — ED Notes (Signed)
Will, PA back at the bedside to update pt.

## 2014-11-16 NOTE — H&P (Signed)
[facility]          History and Physical    PCP:   [name], MD   EDPBaxter Flattery, PA  Chief Complaint:  Weakness  HPI: Patient is a very pleasant 69 year old man with significant past medical history for end-stage renal disease on hemodialysis Monday Wednesday Friday, status post DVT on Coumadin, hypertension, diabetes who presents today with severe weakness and found to have a hemoglobin 6.5. On 6/8 he was found to have a hemoglobin of 7.4 and was given 1 unit of PRBCs. His recent history is significant for a right thigh dialysis graft done in March of this year by Dr. Bridgett Larsson.He has already been seen by nephrology and 2 units of PRBCs have been ordered. Right groin ultrasound is done in the ED and shows a large hypoechoic structure consistent with a hematoma. We have been asked to admit him for further evaluation and management.  Allergies:   Allergies  Allergen Reactions  . Lisinopril Swelling    Lips and tongue swell  . Niacin And Related Other (See Comments)    unknown  . Norvasc [Amlodipine Besylate] Rash    Flushing  . Penicillins Rash      Past Medical History  Diagnosis Date  . Diabetes mellitus   . Hypertension   . Myocardial infarction 1985; 1990  . Angina   . Dysrhythmia   . DVT (deep venous thrombosis) ~ 12/2010    LLE  . Anemia   . Blood transfusion 06/1999    post heart transplant  . Peripheral vascular disease   . Renal failure     Hemodialysis MWF last 2 years, sse dr [name] nephrology, goes to [facility]  . DVT (deep venous thrombosis) 08/25/2014    Nearly occluded R IJ, and L subclavian DVT  . CHF (congestive heart failure) 2001    beffore transplant; had AICD that was explanted 07/19/99 following successful heart transplant  . Coronary artery disease     transplant cardiologist Dr. Corky [facility])  . Pneumonia     Past Surgical History  Procedure Laterality Date  . Nephrectomy transplanted  organ  2008  . Av fistula repair      rt. a=fore arm fistula  . Av fistula placement  ~ 01/2010    "this was my 2nd fistula placement"  . Heart transplant  06/1999  . Colonoscopy  03/17/2012    Procedure: COLONOSCOPY;  Surgeon: Lear Ng, MD;  Location: [facility];  Service: Endoscopy;  Laterality: N/A;  . Coronary artery bypass graft  1990    CABG X3  . Colonoscopy with propofol N/A 12/14/2013    Procedure: COLONOSCOPY WITH PROPOFOL;  Surgeon: Lear Ng, MD;  Location: [facility];  Service: Endoscopy;  Laterality: N/A;  . Video bronchoscopy Bilateral 04/14/2014    Procedure: VIDEO BRONCHOSCOPY WITH FLUORO;  Surgeon: Collene Gobble, MD;  Location: [place];  Service: Cardiopulmonary;  Laterality: Bilateral;  . Shuntogram Right 05/09/2014    Procedure: FISTULOGRAM;  Surgeon: Conrad Philmont, MD;  Location: [facility] CATH LAB;  Service: Cardiovascular;  Laterality: Right;  . Coronary angioplasty with stent placement  1985; 1990    prior stents in the 80's and 90's then CABG; s/p heart transplant 2001 s/p BMS mid LAD and proximal RCA 04/21/13 [facility])  . Av fistula placement Right 10/20/2014    Procedure: INSERTION OF ARTERIOVENOUS (AV) GORE-TEX GRAFT THIGH;  Surgeon: Conrad King, MD;  Location: [place];  Service: Vascular;  Laterality: Right;    Prior to Admission medications   Medication Sig Start Date End Date Taking? Authorizing Provider  atorvastatin (LIPITOR) 40 MG tablet Take 40 mg by mouth daily.     Yes Historical Provider, MD  azaTHIOprine (IMURAN) 50 MG tablet Take 75 mg by mouth every morning.    Yes Historical Provider, MD  b complex-vitamin c-folic acid (NEPHRO-VITE) 0.8 MG TABS tablet Take 1 tablet by mouth at bedtime.   Yes Historical Provider, MD  calcium acetate (PHOSLO) 667 MG capsule Take 667 mg by mouth 3 (three) times daily with meals.  11/01/12  Yes Historical Provider, MD  carvedilol (COREG) 6.25 MG tablet Take 1 tablet (6.25 mg total) by mouth at bedtime.  04/15/14  Yes Geradine Girt, DO  colesevelam (WELCHOL) 625 MG tablet Take 3,750 mg by mouth daily. Takes 6 tabs daily   Yes Historical Provider, MD  famotidine (PEPCID AC) 10 MG chewable tablet Chew 10 mg by mouth at bedtime.    Yes Historical Provider, MD  HUMALOG KWIKPEN 100 UNIT/ML SOPN Inject 4-10 Units into the skin 2 (two) times daily. Per sliding scale 11/17/12  Yes Historical Provider, MD  insulin glargine (LANTUS) 100 UNIT/ML injection Inject 20 Units into the skin every morning.   Yes Historical Provider, MD  isosorbide dinitrate (ISOCHRON) 40 MG CR tablet Take 40 mg by mouth every 8 (eight) hours. 10/03/14  Yes Historical Provider, MD  predniSONE (DELTASONE) 5 MG tablet Take 1 tablet (5 mg total) by mouth daily with breakfast. 08/29/14  Yes Orson Eva, MD  Sirolimus 0.5 MG TABS Take 3 tablets by mouth daily. 10/10/14  Yes Historical Provider, MD  warfarin (COUMADIN) 5 MG tablet Take 1 tablet (5 mg total) by mouth daily. 08/29/14  Yes Orson Eva, MD    Social History:  reports that he quit smoking about 26 years ago. His smoking use included Cigarettes. He has a 20 pack-year smoking history. He has never used smokeless tobacco. He reports that he drinks alcohol. He reports that he does not use illicit drugs.  Family History  Problem Relation Age of Onset  . Heart disease Mother   . Hyperlipidemia Mother   . Hypertension Mother   . Diabetes Mother   . Hyperlipidemia Father   . Hypertension Father   . Heart disease Father   . Diabetes Brother   . Heart disease Brother     Heart Disease before age 38  . Hyperlipidemia Brother   . Hypertension Brother   . Heart attack Brother     Review of Systems:  Constitutional: Denies fever, chills, diaphoresis, appetite change and fatigue.  HEENT: Denies photophobia, eye pain, redness, hearing loss, ear pain, congestion, sore throat, rhinorrhea, sneezing, mouth sores, trouble swallowing, neck pain, neck stiffness and tinnitus.   Respiratory:  Denies SOB, DOE, cough, chest tightness,  and wheezing.   Cardiovascular: Denies chest pain, palpitations and leg swelling.  Gastrointestinal: Denies nausea, vomiting, abdominal pain, diarrhea, constipation, blood in stool and abdominal distention.  Genitourinary: Denies dysuria, urgency, frequency, hematuria, flank pain and difficulty urinating.  Endocrine: Denies: hot or cold intolerance, sweats, changes in hair or nails, polyuria, polydipsia. Musculoskeletal: Denies myalgias, back pain, joint swelling, arthralgias and gait problem.  Skin: Denies pallor, rash and wound.  Neurological: Denies dizziness, seizures, syncope,  light-headedness, numbness and headaches.  Hematological: Denies adenopathy. Easy bruising, personal or family bleeding history  Psychiatric/Behavioral: Denies suicidal ideation,  mood changes, confusion, nervousness, sleep disturbance and agitation   Physical Exam: Blood pressure 158/53, pulse 85, temperature 97.8 F (36.6 C), temperature source Oral, resp. rate 16, weight 72 kg (158 lb 11.7 oz), SpO2 99 %. General: Alert, awake, oriented 3 HEENT: Normocephalic, atraumatic, pupils equal round and reactive to light, extra movements intact, moist mucous membranes. Neck: Supple, no JVD, no lymphadenopathy, no bruits, no goiter. Cardiovascular: Regular rate and rhythm, no murmurs, rubs or gallops. Lungs: Clear to auscultation bilaterally. Excellent abdomen: Soft, nontender, nondistended, positive bowel sounds. Extremities: No clubbing, cyanosis or edema, positive pulses. Right groin area with exposed fat around site of graft placement, no large mass palpated. Neurologic: Grossly intact and nonfocal   Labs on Admission:  Results for orders placed or performed during the hospital encounter of 11/16/14 (from the past 48 hour(s))  CBC with Differential     Status: Abnormal   Collection Time: 11/16/14 11:57 AM  Result Value Ref Range   WBC 4.0 4.0 - 10.5 K/uL   RBC 2.23 (L)  4.22 - 5.81 MIL/uL   Hemoglobin 6.5 (LL) 13.0 - 17.0 g/dL    Comment: REPEATED TO VERIFY CRITICAL RESULT CALLED TO, READ BACK BY AND VERIFIED WITH: H.RINGLEY,RN 11/16/14 1236 BY BSLADE    HCT 21.1 (L) 39.0 - 52.0 %   MCV 94.6 78.0 - 100.0 fL   MCH 29.1 26.0 - 34.0 pg   MCHC 30.8 30.0 - 36.0 g/dL   RDW 18.7 (H) 11.5 - 15.5 %   Platelets 104 (L) 150 - 400 K/uL    Comment: PLATELET COUNT CONFIRMED BY SMEAR   Neutrophils Relative % 43 43 - 77 %   Lymphocytes Relative 46 12 - 46 %   Monocytes Relative 9 3 - 12 %   Eosinophils Relative 2 0 - 5 %   Basophils Relative 0 0 - 1 %   Neutro Abs 1.7 1.7 - 7.7 K/uL   Lymphs Abs 1.8 0.7 - 4.0 K/uL   Monocytes Absolute 0.4 0.1 - 1.0 K/uL   Eosinophils Absolute 0.1 0.0 - 0.7 K/uL   Basophils Absolute 0.0 0.0 - 0.1 K/uL   RBC Morphology BASOPHILIC STIPPLING     Comment: ELLIPTOCYTES POLYCHROMASIA PRESENT TEARDROP CELLS   Type and screen     Status: None (Preliminary result)   Collection Time: 11/16/14 11:57 AM  Result Value Ref Range   ABO/RH(D) O POS    Antibody Screen NEG    Sample Expiration 11/19/2014    Unit Number [id number]    Blood Component Type RED CELLS,LR    Unit division 00    Status of Unit ALLOCATED    Transfusion Status OK TO TRANSFUSE    Crossmatch Result Compatible    Unit Number [id number]    Blood Component Type RED CELLS,LR    Unit division 00    Status of Unit ALLOCATED    Transfusion Status OK TO TRANSFUSE    Crossmatch Result Compatible   I-Stat Chem 8, ED     Status: Abnormal   Collection Time: 11/16/14 12:05 PM  Result Value Ref Range   Sodium 139 135 - 145 mmol/L   Potassium 3.1 (L) 3.5 - 5.1 mmol/L   Chloride 101 101 - 111 mmol/L   BUN 29 (H) 6 - 20 mg/dL   Creatinine, Ser 7.20 (H) 0.61 - 1.24 mg/dL   Glucose, Bld 63 (L) 65 - 99 mg/dL   Calcium, Ion 1.18 1.13 - 1.30 mmol/L   TCO2 25 0 - 100  mmol/L   Hemoglobin 7.1 (L) 13.0 - 17.0 g/dL   HCT 21.0 (L) 39.0 - 52.0 %  Prepare RBC     Status: None     Collection Time: 11/16/14 12:23 PM  Result Value Ref Range   Order Confirmation ORDER PROCESSED BY BLOOD BANK   Vitamin B12     Status: Abnormal   Collection Time: 11/16/14 12:26 PM  Result Value Ref Range   Vitamin B-12 949 (H) 180 - 914 pg/mL    Comment: (NOTE) This assay is not validated for testing neonatal or myeloproliferative syndrome specimens for Vitamin B12 levels.   Folate     Status: None   Collection Time: 11/16/14 12:26 PM  Result Value Ref Range   Folate 61.3 >5.9 ng/mL    Comment: RESULTS CONFIRMED BY MANUAL DILUTION  Iron and TIBC     Status: Abnormal   Collection Time: 11/16/14 12:26 PM  Result Value Ref Range   Iron 74 45 - 182 ug/dL   TIBC 237 (L) 250 - 450 ug/dL   Saturation Ratios 31 17.9 - 39.5 %   UIBC 163 ug/dL  Ferritin     Status: Abnormal   Collection Time: 11/16/14 12:26 PM  Result Value Ref Range   Ferritin 763 (H) 24 - 336 ng/mL  Reticulocytes     Status: Abnormal   Collection Time: 11/16/14 12:26 PM  Result Value Ref Range   Retic Ct Pct 3.2 (H) 0.4 - 3.1 %   RBC. 2.15 (L) 4.22 - 5.81 MIL/uL   Retic Count, Manual 68.8 19.0 - 186.0 K/uL  Basic metabolic panel     Status: Abnormal   Collection Time: 11/16/14 12:26 PM  Result Value Ref Range   Sodium 140 135 - 145 mmol/L   Potassium 3.2 (L) 3.5 - 5.1 mmol/L   Chloride 104 101 - 111 mmol/L   CO2 26 22 - 32 mmol/L   Glucose, Bld 67 65 - 99 mg/dL   BUN 27 (H) 6 - 20 mg/dL   Creatinine, Ser 7.39 (H) 0.61 - 1.24 mg/dL   Calcium 8.6 (L) 8.9 - 10.3 mg/dL   GFR calc non Af Amer 7 (L) >60 mL/min   GFR calc Af Amer 8 (L) >60 mL/min    Comment: (NOTE) The eGFR has been calculated using the CKD EPI equation. This calculation has not been validated in all clinical situations. eGFR's persistently <60 mL/min signify possible Chronic Kidney Disease.    Anion gap 10 5 - 15  Protime-INR     Status: Abnormal   Collection Time: 11/16/14 12:26 PM  Result Value Ref Range   Prothrombin Time 32.1 (H)  11.6 - 15.2 seconds   INR 3.20 (H) 0.00 - 1.49    Radiological Exams on Admission: No results found.  Assessment/Plan Principal Problem:   Symptomatic anemia Active Problems:   DVT (deep venous thrombosis)   Coagulopathy   Heart transplanted   HTN (hypertension), malignant   DM (diabetes mellitus), type 2 with renal complications   ESRD (end stage renal disease) on dialysis    Symptomatic anemia -Source is possibly his right groin hematoma facilitated by his coagulopathy in addition to his anemia of chronic disease from chronic renal failure. -Agree with transfusion of 2 units of PRBCs. -Have requested consultation with vascular surgery, discussed with Dr. Bridgett Larsson. He believes unlikely that the hematoma is the sole source of bleeding. -Patient has not been having melanotic or bright blood per rectum. FOBT 2 days ago was negative.  DVT -Has been on Coumadin since March. -For now will hold Coumadin pending Dr. Lianne Moris assessment of his right thigh graft.  Heart transplant -Continue immunosuppressive medications.  End-stage renal disease on hemodialysis -Appreciate nephrology following.  Hypertension -Continue home medications.  Type 2 diabetes -Sliding-scale insulin.  DVT prophylaxis -SCDs, currently coagulopathic  CODE STATUS -Full code as discussed with patient and wife Pamala Hurry at bedside.    Time Spent on Admission: 80 minutes  HERNANDEZ ACOSTA,General Wearing [facility] Pager: [phone] 11/16/2014, 3:57 PM

## 2014-11-16 NOTE — ED Notes (Signed)
Pt going to dialysis at this time. Wife going home at this time.

## 2014-11-16 NOTE — ED Notes (Signed)
Dressing applied to right groin where graft placed. Pt states he has had some discharge from the site the majority of the time since it was placed. States the discharge has been yellow since it was placed. Dr. Sabra Heck in to look at the site. Admitting MD back in to speak with pt and asked her to look at the site as well.

## 2014-11-17 ENCOUNTER — Encounter (HOSPITAL_COMMUNITY): Payer: Self-pay | Admitting: *Deleted

## 2014-11-17 DIAGNOSIS — Z941 Heart transplant status: Secondary | ICD-10-CM

## 2014-11-17 DIAGNOSIS — E1122 Type 2 diabetes mellitus with diabetic chronic kidney disease: Secondary | ICD-10-CM | POA: Diagnosis not present

## 2014-11-17 DIAGNOSIS — N186 End stage renal disease: Secondary | ICD-10-CM | POA: Diagnosis not present

## 2014-11-17 DIAGNOSIS — I82409 Acute embolism and thrombosis of unspecified deep veins of unspecified lower extremity: Secondary | ICD-10-CM | POA: Diagnosis not present

## 2014-11-17 DIAGNOSIS — D649 Anemia, unspecified: Secondary | ICD-10-CM | POA: Diagnosis not present

## 2014-11-17 DIAGNOSIS — N189 Chronic kidney disease, unspecified: Secondary | ICD-10-CM

## 2014-11-17 LAB — BASIC METABOLIC PANEL
Anion gap: 6 (ref 5–15)
BUN: 9 mg/dL (ref 6–20)
CALCIUM: 8.2 mg/dL — AB (ref 8.9–10.3)
CO2: 29 mmol/L (ref 22–32)
CREATININE: 3.96 mg/dL — AB (ref 0.61–1.24)
Chloride: 101 mmol/L (ref 101–111)
GFR calc non Af Amer: 14 mL/min — ABNORMAL LOW (ref >60)
GFR, EST AFRICAN AMERICAN: 16 mL/min — AB (ref >60)
GLUCOSE: 80 mg/dL (ref 65–99)
Potassium: 3.8 mmol/L (ref 3.5–5.1)
Sodium: 136 mmol/L (ref 135–145)

## 2014-11-17 LAB — TYPE AND SCREEN
ABO/RH(D): O POS
Antibody Screen: NEGATIVE
UNIT DIVISION: 0
Unit division: 0

## 2014-11-17 LAB — HAPTOGLOBIN

## 2014-11-17 LAB — HEPATITIS B SURFACE ANTIGEN: HEP B S AG: NEGATIVE

## 2014-11-17 LAB — GLUCOSE, CAPILLARY
GLUCOSE-CAPILLARY: 91 mg/dL (ref 65–99)
GLUCOSE-CAPILLARY: 140 mg/dL — AB (ref 65–99)
Glucose-Capillary: 68 mg/dL (ref 65–99)
Glucose-Capillary: 90 mg/dL (ref 65–99)

## 2014-11-17 LAB — CBC
HCT: 26.3 % — ABNORMAL LOW (ref 39.0–52.0)
Hemoglobin: 8.5 g/dL — ABNORMAL LOW (ref 13.0–17.0)
MCH: 29.9 pg (ref 26.0–34.0)
MCHC: 32.3 g/dL (ref 30.0–36.0)
MCV: 92.6 fL (ref 78.0–100.0)
PLATELETS: 81 10*3/uL — AB (ref 150–400)
RBC: 2.84 MIL/uL — AB (ref 4.22–5.81)
RDW: 17.4 % — AB (ref 11.5–15.5)
WBC: 3.7 10*3/uL — AB (ref 4.0–10.5)

## 2014-11-17 LAB — HEMOGLOBIN A1C
HEMOGLOBIN A1C: 5.6 % (ref 4.8–5.6)
Mean Plasma Glucose: 114 mg/dL

## 2014-11-17 MED ORDER — VANCOMYCIN HCL 10 G IV SOLR
1500.0000 mg | Freq: Once | INTRAVENOUS | Status: AC
  Administered 2014-11-17: 1500 mg via INTRAVENOUS
  Filled 2014-11-17: qty 1500

## 2014-11-17 MED ORDER — ISOSORBIDE DINITRATE ER 40 MG PO CPCR
40.0000 mg | ORAL_CAPSULE | Freq: Three times a day (TID) | ORAL | Status: DC
  Administered 2014-11-17 – 2014-12-02 (×43): 40 mg via ORAL
  Filled 2014-11-17 (×60): qty 1

## 2014-11-17 MED ORDER — COLLAGENASE 250 UNIT/GM EX OINT
TOPICAL_OINTMENT | Freq: Every day | CUTANEOUS | Status: DC
  Administered 2014-11-17 – 2014-11-20 (×4): via TOPICAL
  Filled 2014-11-17: qty 30

## 2014-11-17 MED ORDER — VANCOMYCIN HCL IN DEXTROSE 750-5 MG/150ML-% IV SOLN
750.0000 mg | INTRAVENOUS | Status: DC
  Administered 2014-11-18 – 2014-11-25 (×3): 750 mg via INTRAVENOUS
  Filled 2014-11-17 (×9): qty 150

## 2014-11-17 NOTE — Progress Notes (Signed)
[facility] Rounding Note Subjective:  Feels a little better post transfusion  Objective Vital signs in last 24 hours: Filed Vitals:   11/16/14 2030 11/16/14 2117 11/16/14 2151 11/17/14 0632  BP: 118/55 128/59 148/59 140/56  Pulse: 96 95 103 97  Temp:  97.7 F (36.5 C) 98 F (36.7 C) 99.5 F (37.5 C)  TempSrc:  Oral Oral Oral  Resp: 18 16 18 20   Height:   6' (1.829 m)   Weight:  70 kg (154 lb 5.2 oz) 74.6 kg (164 lb 7.4 oz)   SpO2:  99% 100% 97%   Weight change:   Intake/Output Summary (Last 24 hours) at 11/17/14 1003 Last data filed at 11/17/14 0730  Gross per 24 hour  Intake    745 ml  Output   1684 ml  Net   -939 ml    Physical Exam:  BP 140/56 mmHg  Pulse 97  Temp(Src) 99.5 F (37.5 C) (Oral)  Resp 20  Ht 6' (1.829 m)  Wt 74.6 kg (164 lb 7.4 oz)  BMI 22.30 kg/m2  SpO2 97%     Labs: Basic Metabolic Panel:  Recent Labs Lab 11/11/14 1642 11/16/14 1205 11/16/14 1226 11/17/14 0526  NA 138 139 140 136  K 3.0* 3.1* 3.2* 3.8  CL 98* 101 104 101  CO2 27  --  26 29  GLUCOSE 114* 63* 67 80  BUN 5* 29* 27* 9  CREATININE 3.05* 7.20* 7.39* 3.96*  CALCIUM 7.7*  --  8.6* 8.2*   Liver Function Tests:  Recent Labs Lab 11/11/14 1642  AST 31  ALT 13*  ALKPHOS 51  BILITOT 1.2  PROT 6.0*  ALBUMIN 2.7*   No results for input(s): LIPASE, AMYLASE in the last 168 hours. No results for input(s): AMMONIA in the last 168 hours. CBC:  Recent Labs Lab 11/11/14 1642 11/16/14 1157 11/16/14 1205 11/17/14 0526  WBC 7.5 4.0  --  3.7*  NEUTROABS 3.9 1.7  --   --   HGB 8.2* 6.5* 7.1* 8.5*  HCT 26.5* 21.1* 21.0* 26.3*  MCV 93.6 94.6  --  92.6  PLT 160 104*  --  81*   PT/INR: @labrcntip (inr:5) Cardiac Enzymes: No results for input(s): CKTOTAL, CKMB, CKMBINDEX, TROPONINI in the last 168 hours. CBG:  Recent Labs Lab 11/16/14 2159 11/16/14 2300 11/17/14 0825  GLUCAP 41* 78 68    Iron Studies:  Recent Labs Lab 11/16/14 1226  IRON 74   TIBC 237*  FERRITIN 763*   Studies/Results: No results found. Medications: . sodium chloride 75 mL/hr at 11/17/14 0423   . sodium chloride   Intravenous Once  . atorvastatin  40 mg Oral Daily  . azaTHIOprine  75 mg Oral q morning - 10a  . [START ON 11/18/2014] calcitRIOL  1 mcg Oral Q M,W,F-HD  . calcium acetate  667 mg Oral TID WC  . carvedilol  6.25 mg Oral QHS  . colesevelam  3,750 mg Oral Daily  . collagenase   Topical Daily  . [START ON 11/23/2014] darbepoetin (ARANESP) injection - DIALYSIS  200 mcg Intravenous Q Wed-HD  . famotidine  10 mg Oral QHS  . insulin aspart  0-5 Units Subcutaneous QHS  . insulin aspart  0-9 Units Subcutaneous TID WC  . insulin aspart  3 Units Subcutaneous TID WC  . insulin glargine  20 Units Subcutaneous q morning - 10a  . isosorbide dinitrate  40 mg Oral 3 times per day  . multivitamin  1 tablet Oral QHS  .  predniSONE  5 mg Oral Q breakfast  . sirolimus  1.5 mg Oral Daily  . [START ON 11/18/2014] vancomycin  750 mg Intravenous Q M,W,F-HD    Dialysis Orders: Center:  [facility] on MWF . EDW 72kg -->will have lower EDW at discharge HD Bath 2k,2.0ca  Time 4hr Heparin 3000.  Access L IJperm cath/ R fem avgg insert 10/20/14 ,  Hectorol 4 mcg IV/HD   Aranesp 200units IV q Wed HD  Venofer 0   Assessment/Plan 1. Anemia - Multifactorial. ESRD. Graft hematoma. Probable resistance to ESA's d/t chronic inflammation (currently dehisced wound from AVG surgery) Feels better after 2 unit transfusion. Continue 200/week aranesp (rec'd dose 6/22). Transfuse PRN 2. ESRD - MWF schedule. Next HD tomorrow. Pt getting IVF at 26 - stop fluids.  3. Dehisced AVG wound with hematoma. Dr. Bridgett Larsson appears to be leaning toward definitive management with evacuation of hematoma and VAC dressing. [facility] is agreeable. In anticipation of this should hold coumadin and bridge with heparin for possible OR (he says Friday or Monday). Now on empiric vancomycin as  well. 4. Hypertension/volume - stable  5. Metabolic bone disease - Iv Hectorol/ binder phoslo  6. Heart transplant - po meds Imuran, Rapamune, Prednisone 7. Ho DVT- on chronic coumadin / PCP follows  INR. If surgery is to be done, will need to hold. 8. Dm type 2- per primary service  Brandon Maes, MD [facility] [phone] pager 11/17/2014, 10:03 AM

## 2014-11-17 NOTE — Progress Notes (Signed)
[facility] PROGRESS NOTE  [facility] YJE:[id number] DOB: [birth year] DOA: 11/16/2014 PCP: [name], MD  Assessment/Plan: Symptomatic anemia -Status post 2 units of PRBCs; hemoglobin 8.2 after transfusion -Patient has not been having melanotic or bright blood per rectum. FOBT 2 days prior to admission was negative. -Most likely source of patient's anemia is blood loss from growing hematoma due to dehiscence of his AVG. -Dr. Bridgett Larsson has been consulted and planning to take patient for surgery on 6/27 -Will follow hemoglobin trend -IV iron and Epogen therapy as per renal service discretion  DVT -Has been on Coumadin since March 2016. -For now will hold Coumadin pending Dr. Lianne Moris assessment of his right thigh graft. -see below  Heart transplant -Continue immunosuppressive medications.  End-stage renal disease on hemodialysis -Appreciate nephrology following. -will follow rec's and continue HD M-W-F  Hypertension -Stable and well control will continue home antihypertensive regimen -Patient on HD M-W-F which is also helping volume control  Type 2 diabetes -continue Sliding-scale insulin. -A1c 5.6  DVT prophylaxis -SCDs, currently coagulopathic -Plan is to discontinue patient Coumadin and allow INR to drift down for surgical procedure on Monday if needed will cover patient with heparin. -Current INR 3.20  Code Status: Full code Family Communication: Wife at bedside Disposition Plan: To be determined. Patient remained inpatient status for now.   Consultants:  Renal service  Vascular surgery  Procedures:  Planning for groin hematoma evacuation and place of Vac (most likely on 6/27)  Antibiotics:  Vancomycin 6/22  HPI/Subjective: Denies chest pain, shortness of breath and orthopnea. Feeling better after transfusion but is still weak.  Objective: Filed Vitals:   11/17/14 1435  BP: 127/52  Pulse: 90  Temp: 99.9 F (37.7 C)  Resp: 20     Intake/Output Summary (Last 24 hours) at 11/17/14 1708 Last data filed at 11/17/14 1024  Gross per 24 hour  Intake   1105 ml  Output   1684 ml  Net   -579 ml   Filed Weights   11/16/14 1650 11/16/14 2117 11/16/14 2151  Weight: 72 kg (158 lb 11.7 oz) 70 kg (154 lb 5.2 oz) 74.6 kg (164 lb 7.4 oz)    Exam:   General:  Afebrile, denies chest pain or shortness of breath currently. Patient reports that he is feeling much better after blood transfusion. Still a little weak  Cardiovascular: S1 and S2, no rubs, no gallops, no murmurs  Respiratory: Good air movement, no wheezing, no crackles  Abdomen: Soft, nontender, nondistended, positive bowel sounds  Musculoskeletal: No edema, no cyanosis or clubbing  Skin:  Necrotic fat evident through R groin incision dehiscence, some serosang fluid on dressing, no TTP along graft, +bruit, faint thrill  Data Reviewed: Basic Metabolic Panel:  Recent Labs Lab 11/11/14 1642 11/16/14 1205 11/16/14 1226 11/17/14 0526  NA 138 139 140 136  K 3.0* 3.1* 3.2* 3.8  CL 98* 101 104 101  CO2 27  --  26 29  GLUCOSE 114* 63* 67 80  BUN 5* 29* 27* 9  CREATININE 3.05* 7.20* 7.39* 3.96*  CALCIUM 7.7*  --  8.6* 8.2*   Liver Function Tests:  Recent Labs Lab 11/11/14 1642  AST 31  ALT 13*  ALKPHOS 51  BILITOT 1.2  PROT 6.0*  ALBUMIN 2.7*   CBC:  Recent Labs Lab 11/11/14 1642 11/16/14 1157 11/16/14 1205 11/17/14 0526  WBC 7.5 4.0  --  3.7*  NEUTROABS 3.9 1.7  --   --   HGB 8.2* 6.5* 7.1* 8.5*  HCT 26.5* 21.1* 21.0* 26.3*  MCV 93.6 94.6  --  92.6  PLT 160 104*  --  81*   ProBNP (last 3 results)  Recent Labs  03/27/14 1747 04/12/14 0105  PROBNP 37853.0* 45045.0*   CBG:  Recent Labs Lab 11/16/14 2159 11/16/14 2300 11/17/14 0825 11/17/14 1219  GLUCAP 41* 78 68 91   Studies: No results found.  Scheduled Meds: . sodium chloride   Intravenous Once  . atorvastatin  40 mg Oral Daily  . azaTHIOprine  75 mg Oral q  morning - 10a  . [START ON 11/18/2014] calcitRIOL  1 mcg Oral Q M,W,F-HD  . calcium acetate  667 mg Oral TID WC  . carvedilol  6.25 mg Oral QHS  . colesevelam  3,750 mg Oral Daily  . collagenase   Topical Daily  . [START ON 11/23/2014] darbepoetin (ARANESP) injection - DIALYSIS  200 mcg Intravenous Q Wed-HD  . famotidine  10 mg Oral QHS  . insulin aspart  0-5 Units Subcutaneous QHS  . insulin aspart  0-9 Units Subcutaneous TID WC  . insulin aspart  3 Units Subcutaneous TID WC  . insulin glargine  20 Units Subcutaneous q morning - 10a  . isosorbide dinitrate  40 mg Oral 3 times per day  . multivitamin  1 tablet Oral QHS  . predniSONE  5 mg Oral Q breakfast  . sirolimus  1.5 mg Oral Daily  . [START ON 11/18/2014] vancomycin  750 mg Intravenous Q M,W,F-HD   Continuous Infusions:   Principal Problem:   Symptomatic anemia Active Problems:   DVT (deep venous thrombosis)   Coagulopathy   Heart transplanted   HTN (hypertension), malignant   DM (diabetes mellitus), type 2 with renal complications   ESRD (end stage renal disease) on dialysis    Time spent: 30 minutes   Barton Dubois  [facility] Pager [phone]. If 7PM-7AM, please contact night-coverage at [email or web address], password [facility] 11/17/2014, 5:08 PM

## 2014-11-17 NOTE — Progress Notes (Signed)
ANTIBIOTIC CONSULT NOTE - INITIAL  Pharmacy Consult for vancomycin Indication: open wound  Allergies  Allergen Reactions  . Lisinopril Swelling    Lips and tongue swell  . Niacin And Related Other (See Comments)    unknown  . Norvasc [Amlodipine Besylate] Rash    Flushing  . Penicillins Rash    Patient Measurements: Height: 6' (182.9 cm) Weight: 164 lb 7.4 oz (74.6 kg) IBW/kg (Calculated) : 77.6  Vital Signs: Temp: 98 F (36.7 C) (06/22 2151) Temp Source: Oral (06/22 2151) BP: 148/59 mmHg (06/22 2151) Pulse Rate: 103 (06/22 2151) Intake/Output from previous day: 06/22 0701 - 06/23 0700 In: 745 [I.V.:75; Blood:670] Out: 1684  Intake/Output from this shift: Total I/O In: -  Out: 0347 [Other:1684]  Labs:  Recent Labs  11/16/14 1157 11/16/14 1205 11/16/14 1226  WBC 4.0  --   --   HGB 6.5* 7.1*  --   PLT 104*  --   --   CREATININE  --  7.20* 7.39*   Estimated Creatinine Clearance: 10 mL/min (by C-G formula based on Cr of 7.39).  Medical History: Past Medical History  Diagnosis Date  . Diabetes mellitus   . Hypertension   . Myocardial infarction 1985; 1990  . Angina   . Dysrhythmia   . DVT (deep venous thrombosis) ~ 12/2010    LLE  . Anemia   . Blood transfusion 06/1999    post heart transplant  . Peripheral vascular disease   . Renal failure     Hemodialysis MWF last 2 years, sse dr [name] nephrology, goes to [facility]  . DVT (deep venous thrombosis) 08/25/2014    Nearly occluded R IJ, and L subclavian DVT  . CHF (congestive heart failure) 2001    beffore transplant; had AICD that was explanted 07/19/99 following successful heart transplant  . Coronary artery disease     transplant cardiologist Dr. Corky Mull [facility])  . Pneumonia     Medications:  Prescriptions prior to admission  Medication Sig Dispense Refill Last Dose  . atorvastatin (LIPITOR) 40 MG tablet Take 40 mg by mouth daily.     11/15/2014 at Unknown time  .  azaTHIOprine (IMURAN) 50 MG tablet Take 75 mg by mouth every morning.    11/16/2014 at Unknown time  . b complex-vitamin c-folic acid (NEPHRO-VITE) 0.8 MG TABS tablet Take 1 tablet by mouth at bedtime.   11/16/2014 at Unknown time  . calcium acetate (PHOSLO) 667 MG capsule Take 667 mg by mouth 3 (three) times daily with meals.    11/16/2014 at Unknown time  . carvedilol (COREG) 6.25 MG tablet Take 1 tablet (6.25 mg total) by mouth at bedtime. 30 tablet 0 11/15/2014 at 2000  . colesevelam (WELCHOL) 625 MG tablet Take 3,750 mg by mouth daily. Takes 6 tabs daily   11/15/2014 at Unknown time  . famotidine (PEPCID AC) 10 MG chewable tablet Chew 10 mg by mouth at bedtime.    11/15/2014 at Unknown time  . HUMALOG KWIKPEN 100 UNIT/ML SOPN Inject 4-10 Units into the skin 2 (two) times daily. Per sliding scale   11/16/2014 at Unknown time  . insulin glargine (LANTUS) 100 UNIT/ML injection Inject 20 Units into the skin every morning.   11/16/2014 at Unknown time  . isosorbide dinitrate (ISOCHRON) 40 MG CR tablet Take 40 mg by mouth every 8 (eight) hours.  11 11/16/2014 at Unknown time  . predniSONE (DELTASONE) 5 MG tablet Take 1 tablet (5 mg total) by mouth daily  with breakfast. 30 tablet 0 11/16/2014 at Unknown time  . Sirolimus 0.5 MG TABS Take 3 tablets by mouth daily.  11 11/16/2014 at Unknown time  . warfarin (COUMADIN) 5 MG tablet Take 1 tablet (5 mg total) by mouth daily. 30 tablet 0 11/15/2014 at Unknown time   Scheduled:  . sodium chloride   Intravenous Once  . atorvastatin  40 mg Oral Daily  . azaTHIOprine  75 mg Oral q morning - 10a  . [START ON 11/18/2014] calcitRIOL  1 mcg Oral Q M,W,F-HD  . calcium acetate  667 mg Oral TID WC  . carvedilol  6.25 mg Oral QHS  . colesevelam  3,750 mg Oral Daily  . Darbepoetin Alfa      . [START ON 11/23/2014] darbepoetin (ARANESP) injection - DIALYSIS  200 mcg Intravenous Q Wed-HD  . famotidine  10 mg Oral QHS  . insulin aspart  0-5 Units Subcutaneous QHS  . insulin  aspart  0-9 Units Subcutaneous TID WC  . insulin aspart  3 Units Subcutaneous TID WC  . insulin glargine  20 Units Subcutaneous q morning - 10a  . isosorbide dinitrate  40 mg Oral 3 times per day  . multivitamin  1 tablet Oral QHS  . predniSONE  5 mg Oral Q breakfast  . sirolimus  1.5 mg Oral Daily   Infusions:  . sodium chloride      Assessment: 69yo male s/p thigh AVG complicated w/ hematoma, now w/ superficial skin separation 2/2 maceration right groin and poor wound healing, to begin empiric ABX for loss of barrier function.  Goal of Therapy:  Pre-HD vanc level 15-25  Plan:  Will give vancomycin 1500mg  IV x1 followed by 750mg  IV after each HD and monitor CBC, Cx, levels prn.  Wynona Neat, PharmD, BCPS  11/17/2014,12:47 AM

## 2014-11-17 NOTE — Progress Notes (Signed)
   Daily Progress Note  Assessment/Planning: S/p R thigh AVG complicated with hematoma likely due to anticoagulation and post-surgical state, superficial skin separation due obvious maceration right groin and poor wound healing related to immunosuppressed status   No evidence of graft infection at this time  Obvious groin hematoma  Dry dressing to right groin BID to see if any drainage  Reasonable to consider empiric abx given loss of barrier function due to superficial skin separation  Subjective    No complaints  Objective Filed Vitals:   11/16/14 2000 11/16/14 2030 11/16/14 2117 11/16/14 2151  BP: 124/59 118/55 128/59 148/59  Pulse: 93 96 95 103  Temp:   97.7 F (36.5 C) 98 F (36.7 C)  TempSrc:   Oral Oral  Resp: 16 18 16 18   Height:    6' (1.829 m)  Weight:   154 lb 5.2 oz (70 kg) 164 lb 7.4 oz (74.6 kg)  SpO2:   99% 100%    Intake/Output Summary (Last 24 hours) at 11/17/14 0017 Last data filed at 11/16/14 2117  Gross per 24 hour  Intake    745 ml  Output   1684 ml  Net   -939 ml   VASC  No tenderness throughout route of R thigh AVG, obvious hematoma in anterior superior thigh, superficial separation at skin incision in groin  Laboratory CBC    Component Value Date/Time   WBC 4.0 11/16/2014 1157   WBC 4.5 02/13/2012 1314   HGB 7.1* 11/16/2014 1205   HGB 11.6* 02/13/2012 1314   HCT 21.0* 11/16/2014 1205   HCT 36.1* 02/13/2012 1314   PLT 104* 11/16/2014 1157   PLT 76* 02/13/2012 1314    BMET    Component Value Date/Time   NA 140 11/16/2014 1226   K 3.2* 11/16/2014 1226   CL 104 11/16/2014 1226   CO2 26 11/16/2014 1226   GLUCOSE 67 11/16/2014 1226   BUN 27* 11/16/2014 1226   CREATININE 7.39* 11/16/2014 1226   CALCIUM 8.6* 11/16/2014 1226   GFRNONAA 7* 11/16/2014 1226   GFRAA 8* 11/16/2014 [place], MD [facility] [place]: [phone] Pager: [phone]  11/17/2014, 12:17 AM

## 2014-11-17 NOTE — Progress Notes (Signed)
   Daily Progress Note  Assessment/Planning: S/p R thigh AVG complicated with hematoma and R groin wound complication   Given pt's immunosuppressed status, it may been beneficial to be more aggressive and evacuate the right groin hematoma and place a VAC dressing in the OR.  Alternative is purely wound care.  Will see which patient prefers  The thrill in this graft should also be more robust, suggesting the hematoma may be compressing the graft.  Continue Vancomycin for now for empiric coverage given immunosuppressed status with wound overlying thigh AVG  At this earliest, I could get him on is Fri/Mon depending on OR availability  Reversal of his anticoagulation would also be essential to facilitate the hematoma evacuation if he elects to proceed with such.   Subjective    Tenderness R groin  Objective Filed Vitals:   11/16/14 2000 11/16/14 2030 11/16/14 2117 11/16/14 2151  BP: 124/59 118/55 128/59 148/59  Pulse: 93 96 95 103  Temp:   97.7 F (36.5 C) 98 F (36.7 C)  TempSrc:   Oral Oral  Resp: 16 18 16 18   Height:    6' (1.829 m)  Weight:   154 lb 5.2 oz (70 kg) 164 lb 7.4 oz (74.6 kg)  SpO2:   99% 100%    Intake/Output Summary (Last 24 hours) at 11/17/14 8184 Last data filed at 11/16/14 2117  Gross per 24 hour  Intake    745 ml  Output   1684 ml  Net   -939 ml   VASC  Necrotic fat evident through R groin incision dehiscence, some serosang fluid on dressing, no TTP along graft, +bruit, faint thrill  Laboratory CBC    Component Value Date/Time   WBC 3.7* 11/17/2014 0526   WBC 4.5 02/13/2012 1314   HGB 8.5* 11/17/2014 0526   HGB 11.6* 02/13/2012 1314   HCT 26.3* 11/17/2014 0526   HCT 36.1* 02/13/2012 1314   PLT 81* 11/17/2014 0526   PLT 76* 02/13/2012 1314    BMET    Component Value Date/Time   NA 136 11/17/2014 0526   K 3.8 11/17/2014 0526   CL 101 11/17/2014 0526   CO2 29 11/17/2014 0526   GLUCOSE 80 11/17/2014 0526   BUN 9 11/17/2014 0526   CREATININE 3.96* 11/17/2014 0526   CALCIUM 8.2* 11/17/2014 0526   GFRNONAA 14* 11/17/2014 0526   GFRAA 16* 11/17/2014 0526    Adele Barthel, MD [facility] [place] Office: [phone] Pager: [phone]  11/17/2014, 6:28 AM

## 2014-11-17 NOTE — Progress Notes (Signed)
   Daily Progress Note  Will not be able to schedule procedure for tomorrow, so will aim for Monday.  Would let this patient's INR drift down over the weekend.  May need active reversal if INR doesn't normalize rapidly enough by Monday.  Adele Barthel, MD [facility] [place] Office: [phone] Pager: [phone]  11/17/2014, 1:00 PM

## 2014-11-17 NOTE — Progress Notes (Signed)
Inpatient Diabetes Program Recommendations  AACE/ADA: New Consensus Statement on Inpatient Glycemic Control (2013)  Target Ranges:  Prepandial:   less than 140 mg/dL      Peak postprandial:   less than 180 mg/dL (1-2 hours)      Critically ill patients:  140 - 180 mg/dL  Results for REGIE, BUNNER (MRN [id number]) as of 11/17/2014 09:07  Ref. Range 11/16/2014 21:59 11/16/2014 23:00 11/17/2014 08:25  Glucose-Capillary Latest Ref Range: 65-99 mg/dL 41 (LL) 78 68   Reason for Admission: Symptomatic Anemia  Diabetes history: DM 2 Outpatient Diabetes medications: Lantus 20 units Daily, Humalog 4-10 units BID Current orders for Inpatient glycemic control: Lantus 20 units Daily, Novolog 0-9 units TID, Novolog 0-5 units QHS, Novolog 3 units meal coverage.  Inpatient Diabetes Program Recommendations Insulin - Basal: Patient having hypoglycemia. Please consider holding basal insulin until glucose levels are higher. When basal is restarted may want to start patient on half of home dose, Lantus 10 units Daily.  Thanks,  Tama Headings RN, MSN, [facility] Inpatient Diabetes Coordinator Team Pager [phone] (8AM-5PM)

## 2014-11-17 NOTE — Progress Notes (Signed)
ANTICOAGULATION CONSULT NOTE - Initial Consult  Pharmacy Consult for Heparin when INR < 2 Indication: DVT  Allergies  Allergen Reactions  . Lisinopril Swelling    Lips and tongue swell  . Niacin And Related Other (See Comments)    unknown  . Norvasc [Amlodipine Besylate] Rash    Flushing  . Penicillins Rash    Patient Measurements: Height: 6' (182.9 cm) Weight: 164 lb 7.4 oz (74.6 kg) IBW/kg (Calculated) : 77.6  Vital Signs: Temp: 99.5 F (37.5 C) (06/23 0632) Temp Source: Oral (06/23 1610) BP: 140/56 mmHg (06/23 9604) Pulse Rate: 97 (06/23 0632)  Labs:  Recent Labs  11/16/14 1157 11/16/14 1205 11/16/14 1226 11/17/14 0526  HGB 6.5* 7.1*  --  8.5*  HCT 21.1* 21.0*  --  26.3*  PLT 104*  --   --  81*  LABPROT  --   --  32.1*  --   INR  --   --  3.20*  --   CREATININE  --  7.20* 7.39* 3.96*    Estimated Creatinine Clearance: 18.6 mL/min (by C-G formula based on Cr of 3.96).   Medical History: Past Medical History  Diagnosis Date  . Diabetes mellitus   . Hypertension   . Myocardial infarction 1985; 1990  . Angina   . Dysrhythmia   . DVT (deep venous thrombosis) ~ 12/2010    LLE  . Anemia   . Blood transfusion 06/1999    post heart transplant  . Peripheral vascular disease   . Renal failure     Hemodialysis MWF last 2 years, sse dr [name] nephrology, goes to [facility] [facility]  . DVT (deep venous thrombosis) 08/25/2014    Nearly occluded R IJ, and L subclavian DVT  . CHF (congestive heart failure) 2001    beffore transplant; had AICD that was explanted 07/19/99 following successful heart transplant  . Coronary artery disease     transplant cardiologist Dr. Corky Mull [facility])  . Pneumonia     Assessment: 69 year old male on Coumadin PTA for DVT.  Coumadin now on hold.  Admit INR = 3.2.  Pharmacy to begin heparin when INR less than 2.  Goal of Therapy:  Heparin level 0.3-0.7 units/ml Monitor platelets by anticoagulation protocol: Yes    Plan:  Daily INR Heparin when INR < 2  Thank you. Anette Guarneri, PharmD [phone]  11/17/2014,10:36 AM

## 2014-11-18 DIAGNOSIS — E1122 Type 2 diabetes mellitus with diabetic chronic kidney disease: Secondary | ICD-10-CM | POA: Diagnosis present

## 2014-11-18 DIAGNOSIS — I82409 Acute embolism and thrombosis of unspecified deep veins of unspecified lower extremity: Secondary | ICD-10-CM | POA: Diagnosis not present

## 2014-11-18 DIAGNOSIS — Z86718 Personal history of other venous thrombosis and embolism: Secondary | ICD-10-CM | POA: Diagnosis not present

## 2014-11-18 DIAGNOSIS — S7011XS Contusion of right thigh, sequela: Secondary | ICD-10-CM | POA: Diagnosis not present

## 2014-11-18 DIAGNOSIS — E1121 Type 2 diabetes mellitus with diabetic nephropathy: Secondary | ICD-10-CM | POA: Diagnosis not present

## 2014-11-18 DIAGNOSIS — M898X9 Other specified disorders of bone, unspecified site: Secondary | ICD-10-CM | POA: Diagnosis present

## 2014-11-18 DIAGNOSIS — D631 Anemia in chronic kidney disease: Secondary | ICD-10-CM | POA: Diagnosis present

## 2014-11-18 DIAGNOSIS — Z9225 Personal history of immunosupression therapy: Secondary | ICD-10-CM | POA: Diagnosis not present

## 2014-11-18 DIAGNOSIS — D689 Coagulation defect, unspecified: Secondary | ICD-10-CM | POA: Diagnosis not present

## 2014-11-18 DIAGNOSIS — N189 Chronic kidney disease, unspecified: Secondary | ICD-10-CM | POA: Diagnosis not present

## 2014-11-18 DIAGNOSIS — T45515A Adverse effect of anticoagulants, initial encounter: Secondary | ICD-10-CM | POA: Diagnosis present

## 2014-11-18 DIAGNOSIS — Z941 Heart transplant status: Secondary | ICD-10-CM | POA: Diagnosis not present

## 2014-11-18 DIAGNOSIS — Z7952 Long term (current) use of systemic steroids: Secondary | ICD-10-CM | POA: Diagnosis not present

## 2014-11-18 DIAGNOSIS — Z79899 Other long term (current) drug therapy: Secondary | ICD-10-CM | POA: Diagnosis not present

## 2014-11-18 DIAGNOSIS — D649 Anemia, unspecified: Secondary | ICD-10-CM | POA: Diagnosis not present

## 2014-11-18 DIAGNOSIS — Z794 Long term (current) use of insulin: Secondary | ICD-10-CM | POA: Diagnosis not present

## 2014-11-18 DIAGNOSIS — R269 Unspecified abnormalities of gait and mobility: Secondary | ICD-10-CM | POA: Diagnosis not present

## 2014-11-18 DIAGNOSIS — N2581 Secondary hyperparathyroidism of renal origin: Secondary | ICD-10-CM | POA: Diagnosis present

## 2014-11-18 DIAGNOSIS — E1129 Type 2 diabetes mellitus with other diabetic kidney complication: Secondary | ICD-10-CM | POA: Diagnosis not present

## 2014-11-18 DIAGNOSIS — T8130XA Disruption of wound, unspecified, initial encounter: Secondary | ICD-10-CM | POA: Diagnosis present

## 2014-11-18 DIAGNOSIS — I252 Old myocardial infarction: Secondary | ICD-10-CM | POA: Diagnosis not present

## 2014-11-18 DIAGNOSIS — I739 Peripheral vascular disease, unspecified: Secondary | ICD-10-CM | POA: Diagnosis not present

## 2014-11-18 DIAGNOSIS — I25811 Atherosclerosis of native coronary artery of transplanted heart without angina pectoris: Secondary | ICD-10-CM | POA: Diagnosis present

## 2014-11-18 DIAGNOSIS — Z22322 Carrier or suspected carrier of Methicillin resistant Staphylococcus aureus: Secondary | ICD-10-CM | POA: Diagnosis not present

## 2014-11-18 DIAGNOSIS — D696 Thrombocytopenia, unspecified: Secondary | ICD-10-CM | POA: Diagnosis present

## 2014-11-18 DIAGNOSIS — R5381 Other malaise: Secondary | ICD-10-CM | POA: Diagnosis not present

## 2014-11-18 DIAGNOSIS — Z7901 Long term (current) use of anticoagulants: Secondary | ICD-10-CM | POA: Diagnosis not present

## 2014-11-18 DIAGNOSIS — K5900 Constipation, unspecified: Secondary | ICD-10-CM | POA: Diagnosis not present

## 2014-11-18 DIAGNOSIS — I120 Hypertensive chronic kidney disease with stage 5 chronic kidney disease or end stage renal disease: Secondary | ICD-10-CM | POA: Diagnosis present

## 2014-11-18 DIAGNOSIS — Z87891 Personal history of nicotine dependence: Secondary | ICD-10-CM | POA: Diagnosis not present

## 2014-11-18 DIAGNOSIS — I70221 Atherosclerosis of native arteries of extremities with rest pain, right leg: Secondary | ICD-10-CM | POA: Diagnosis present

## 2014-11-18 DIAGNOSIS — I97618 Postprocedural hemorrhage and hematoma of a circulatory system organ or structure following other circulatory system procedure: Secondary | ICD-10-CM | POA: Diagnosis present

## 2014-11-18 DIAGNOSIS — R531 Weakness: Secondary | ICD-10-CM | POA: Diagnosis present

## 2014-11-18 DIAGNOSIS — N186 End stage renal disease: Secondary | ICD-10-CM | POA: Diagnosis present

## 2014-11-18 DIAGNOSIS — E11649 Type 2 diabetes mellitus with hypoglycemia without coma: Secondary | ICD-10-CM | POA: Diagnosis not present

## 2014-11-18 DIAGNOSIS — Z992 Dependence on renal dialysis: Secondary | ICD-10-CM | POA: Diagnosis not present

## 2014-11-18 DIAGNOSIS — E440 Moderate protein-calorie malnutrition: Secondary | ICD-10-CM | POA: Diagnosis present

## 2014-11-18 DIAGNOSIS — I10 Essential (primary) hypertension: Secondary | ICD-10-CM | POA: Diagnosis not present

## 2014-11-18 DIAGNOSIS — R52 Pain, unspecified: Secondary | ICD-10-CM | POA: Diagnosis not present

## 2014-11-18 LAB — IRON AND TIBC
Iron: 54 ug/dL (ref 45–182)
Saturation Ratios: 25 % (ref 17.9–39.5)
TIBC: 220 ug/dL — ABNORMAL LOW (ref 250–450)
UIBC: 166 ug/dL

## 2014-11-18 LAB — GLUCOSE, CAPILLARY
Glucose-Capillary: 92 mg/dL (ref 65–99)
Glucose-Capillary: 120 mg/dL — ABNORMAL HIGH (ref 65–99)

## 2014-11-18 LAB — PROTIME-INR
INR: 2.18 — ABNORMAL HIGH (ref 0.00–1.49)
Prothrombin Time: 24.1 seconds — ABNORMAL HIGH (ref 11.6–15.2)

## 2014-11-18 LAB — RENAL FUNCTION PANEL
ALBUMIN: 2.4 g/dL — AB (ref 3.5–5.0)
Anion gap: 9 (ref 5–15)
BUN: 22 mg/dL — AB (ref 6–20)
CALCIUM: 8.6 mg/dL — AB (ref 8.9–10.3)
CO2: 26 mmol/L (ref 22–32)
CREATININE: 6.3 mg/dL — AB (ref 0.61–1.24)
Chloride: 100 mmol/L — ABNORMAL LOW (ref 101–111)
GFR calc Af Amer: 9 mL/min — ABNORMAL LOW (ref >60)
GFR calc non Af Amer: 8 mL/min — ABNORMAL LOW (ref >60)
Glucose, Bld: 85 mg/dL (ref 65–99)
PHOSPHORUS: 3 mg/dL (ref 2.5–4.6)
Potassium: 3.9 mmol/L (ref 3.5–5.1)
Sodium: 135 mmol/L (ref 135–145)

## 2014-11-18 LAB — CBC
HEMATOCRIT: 28.4 % — AB (ref 39.0–52.0)
HEMOGLOBIN: 8.9 g/dL — AB (ref 13.0–17.0)
MCH: 29.8 pg (ref 26.0–34.0)
MCHC: 31.3 g/dL (ref 30.0–36.0)
MCV: 95 fL (ref 78.0–100.0)
Platelets: 77 10*3/uL — ABNORMAL LOW (ref 150–400)
RBC: 2.99 MIL/uL — AB (ref 4.22–5.81)
RDW: 17.8 % — ABNORMAL HIGH (ref 11.5–15.5)
WBC: 4.4 10*3/uL (ref 4.0–10.5)

## 2014-11-18 MED ORDER — DOXERCALCIFEROL 4 MCG/2ML IV SOLN
INTRAVENOUS | Status: AC
  Administered 2014-11-18: 4 ug via INTRAVENOUS
  Filled 2014-11-18: qty 2

## 2014-11-18 MED ORDER — PRO-STAT SUGAR FREE PO LIQD
30.0000 mL | Freq: Two times a day (BID) | ORAL | Status: DC
  Administered 2014-11-19 – 2014-11-24 (×7): 30 mL via ORAL
  Filled 2014-11-18 (×29): qty 30

## 2014-11-18 MED ORDER — SODIUM CHLORIDE 0.9 % IV SOLN
125.0000 mg | INTRAVENOUS | Status: AC
  Administered 2014-11-18 – 2014-11-25 (×4): 125 mg via INTRAVENOUS
  Filled 2014-11-18 (×9): qty 10

## 2014-11-18 MED ORDER — DOXERCALCIFEROL 4 MCG/2ML IV SOLN
4.0000 ug | INTRAVENOUS | Status: DC
  Administered 2014-11-18 – 2014-11-30 (×3): 4 ug via INTRAVENOUS
  Filled 2014-11-18 (×6): qty 2

## 2014-11-18 MED ORDER — BOOST / RESOURCE BREEZE PO LIQD
1.0000 | Freq: Two times a day (BID) | ORAL | Status: DC
  Administered 2014-11-19 – 2014-11-28 (×9): 1 via ORAL

## 2014-11-18 NOTE — Progress Notes (Signed)
[facility] PROGRESS NOTE  JOWEL WALTNER YQM:[id number] DOB: [birth year] DOA: 11/16/2014 PCP: [name], MD  Assessment/Plan: Symptomatic anemia -Status post 2 units of PRBCs; hemoglobin 8.2 after transfusion -Patient has not been having melanotic or bright blood per rectum. FOBT 2 days prior to admission was negative. -Most likely source of patient's anemia is blood loss from growing hematoma due to dehiscence of his AVG. -Dr. Bridgett Larsson has been consulted and planning to take patient for surgery on 6/27 -Will follow hemoglobin trend -IV iron and Epogen therapy as per renal service discretion  DVT -Has been on Coumadin since March 2016. -For now will hold Coumadin pending Dr. Lianne Moris assessment of his right thigh graft- let it drift down over the weekend  Heart transplant -Continue immunosuppressive medications.  End-stage renal disease on hemodialysis -Appreciate nephrology following. -will follow rec's and continue HD M-W-F  Hypertension -Stable and well control will continue home antihypertensive regimen -Patient on HD M-W-F which is also helping volume control  Type 2 diabetes -continue Sliding-scale insulin. -A1c 5.6  DVT prophylaxis -SCDs, currently coagulopathic -Plan is to discontinue patient Coumadin and allow INR to drift down for surgical procedure on Monday if needed will cover patient with heparin. -Current INR 3.20  Code Status: Full code Family Communication: in dialysis, no family Disposition Plan: surgery on Monday   Consultants:  Renal service  Vascular surgery  Procedures:  Planning for groin hematoma evacuation and place of Vac (most likely on 6/27)  Antibiotics:  Vancomycin 6/22  HPI/Subjective: In dialysis  Objective: Filed Vitals:   11/18/14 1245  BP: 106/56  Pulse: 85  Temp:   Resp:     Intake/Output Summary (Last 24 hours) at 11/18/14 1448 Last data filed at 11/18/14 1245  Gross per 24 hour  Intake      0 ml   Output   2000 ml  Net  -2000 ml   Filed Weights   11/16/14 2117 11/16/14 2151 11/18/14 0753  Weight: 70 kg (154 lb 5.2 oz) 74.6 kg (164 lb 7.4 oz) 77.1 kg (169 lb 15.6 oz)    Exam:   General:  In dialysis, NAD  Data Reviewed: Basic Metabolic Panel:  Recent Labs Lab 11/11/14 1642 11/16/14 1205 11/16/14 1226 11/17/14 0526 11/18/14 0620  NA 138 139 140 136 135  K 3.0* 3.1* 3.2* 3.8 3.9  CL 98* 101 104 101 100*  CO2 27  --  26 29 26   GLUCOSE 114* 63* 67 80 85  BUN 5* 29* 27* 9 22*  CREATININE 3.05* 7.20* 7.39* 3.96* 6.30*  CALCIUM 7.7*  --  8.6* 8.2* 8.6*  PHOS  --   --   --   --  3.0   Liver Function Tests:  Recent Labs Lab 11/11/14 1642 11/18/14 0620  AST 31  --   ALT 13*  --   ALKPHOS 51  --   BILITOT 1.2  --   PROT 6.0*  --   ALBUMIN 2.7* 2.4*   CBC:  Recent Labs Lab 11/11/14 1642 11/16/14 1157 11/16/14 1205 11/17/14 0526 11/18/14 0620  WBC 7.5 4.0  --  3.7* 4.4  NEUTROABS 3.9 1.7  --   --   --   HGB 8.2* 6.5* 7.1* 8.5* 8.9*  HCT 26.5* 21.1* 21.0* 26.3* 28.4*  MCV 93.6 94.6  --  92.6 95.0  PLT 160 104*  --  81* 77*   ProBNP (last 3 results)  Recent Labs  03/27/14 1747 04/12/14 0105  PROBNP 37853.0* 45045.0*  CBG:  Recent Labs Lab 11/16/14 2300 11/17/14 0825 11/17/14 1219 11/17/14 1707 11/17/14 2129  GLUCAP 78 68 91 140* 90   Studies: No results found.  Scheduled Meds: . sodium chloride   Intravenous Once  . atorvastatin  40 mg Oral Daily  . azaTHIOprine  75 mg Oral q morning - 10a  . calcium acetate  667 mg Oral TID WC  . carvedilol  6.25 mg Oral QHS  . colesevelam  3,750 mg Oral Daily  . collagenase   Topical Daily  . [START ON 11/23/2014] darbepoetin (ARANESP) injection - DIALYSIS  200 mcg Intravenous Q Wed-HD  . doxercalciferol  4 mcg Intravenous Q M,W,F-HD  . famotidine  10 mg Oral QHS  . [START ON 11/19/2014] feeding supplement (PRO-STAT SUGAR FREE 64)  30 mL Oral BID  . [START ON 11/19/2014] feeding supplement  (RESOURCE BREEZE)  1 Container Oral BID BM  . ferric gluconate (FERRLECIT/NULECIT) IV  125 mg Intravenous Q M,W,F-HD  . insulin aspart  0-5 Units Subcutaneous QHS  . insulin aspart  0-9 Units Subcutaneous TID WC  . insulin aspart  3 Units Subcutaneous TID WC  . insulin glargine  20 Units Subcutaneous q morning - 10a  . isosorbide dinitrate  40 mg Oral 3 times per day  . multivitamin  1 tablet Oral QHS  . predniSONE  5 mg Oral Q breakfast  . sirolimus  1.5 mg Oral Daily  . vancomycin  750 mg Intravenous Q M,W,F-HD   Continuous Infusions:   Principal Problem:   Symptomatic anemia Active Problems:   DVT (deep venous thrombosis)   Coagulopathy   Heart transplanted   HTN (hypertension), malignant   DM (diabetes mellitus), type 2 with renal complications   ESRD (end stage renal disease) on dialysis    Time spent: 15 minutes   Kaj Vasil  [facility] Pager [phone]. If 7PM-7AM, please contact night-coverage at [email or web address], password [facility] 11/18/2014, 2:48 PM  LOS: 0 days

## 2014-11-18 NOTE — Progress Notes (Signed)
Initial Nutrition Assessment  DOCUMENTATION CODES:  Not applicable  INTERVENTION:  Boost Breeze, Snacks, Prostat BID.  Encourage adequate PO intake.   NUTRITION DIAGNOSIS:  Increased nutrient needs related to wound healing as evidenced by estimated needs.  GOAL:  Patient will meet greater than or equal to 90% of their needs  MONITOR:  PO intake, Supplement acceptance, Weight trends, Labs, I & O's  REASON FOR ASSESSMENT:  Consult Wound healing  ASSESSMENT: Pt with significant past medical history for end-stage renal disease on HD, status post DVT on Coumadin, hypertension, diabetes who presents today with severe weakness and found to have a hemoglobin 6.5. S/p R thigh AVG complicated with hematoma and R groin wound complication.  Pt reports having a good appetite currently and PTA with consumption of 3 meals a day with no other difficulties. No percent meal completion recorded, however during time of visit in HD pt reports he just consumed a muffin and cup of coffee that RN provided. Weight has been stable per Epic weight records. Pt was offered supplements however refused reporting he does not like the taste of the "milkshake" drinks. Pt is more agreeable to snacks. RD to order. Pt does report he has not tried [employer] and seemed curious about them. RD will order. Pt was encouraged to eat his food at meals.  Nutrition-Focused physical exam completed. Findings are no fat depletion, moderate to severe muscle depletion, and moderate edema.   Labs: Low chloride, calcium, and GFR. High BUN and creatinine.  Height:  Ht Readings from Last 1 Encounters:  11/16/14 6' (1.829 m)    Weight:  Wt Readings from Last 1 Encounters:  11/18/14 169 lb 15.6 oz (77.1 kg)    Ideal Body Weight:  81 kg  Wt Readings from Last 10 Encounters:  11/18/14 169 lb 15.6 oz (77.1 kg)  11/08/14 165 lb (74.844 kg)  10/20/14 159 lb (72.122 kg)  10/11/14 161 lb (73.029 kg)  09/29/14  161 lb 9.6 oz (73.301 kg)  08/29/14 158 lb 15.2 oz (72.1 kg)  06/21/14 159 lb (72.122 kg)  05/11/14 154 lb 1.6 oz (69.9 kg)  05/03/14 158 lb 8 oz (71.895 kg)  04/26/14 163 lb (73.936 kg)    BMI:  Body mass index is 23.05 kg/(m^2).  Estimated Nutritional Needs:  Kcal:  2100-2300  Protein:  100-115 grams  Fluid:  1.2 L/day  Skin:  Wound (see comment) (Wound on R groin, +2 RUE, +1 LUE, non-pitting LE edema)  Diet Order:  Diet renal with fluid restriction Fluid restriction:: 1200 mL Fluid; Room service appropriate?: Yes; Fluid consistency:: Thin  EDUCATION NEEDS:  No education needs identified at this time   Intake/Output Summary (Last 24 hours) at 11/18/14 1259 Last data filed at 11/18/14 0500  Gross per 24 hour  Intake      0 ml  Output      0 ml  Net      0 ml    Last BM:  6/22  Corrin Parker, MS, RD, LDN Pager # [phone] After hours/ weekend pager # [phone]

## 2014-11-18 NOTE — Progress Notes (Signed)
Spoke to dialysis RN about b/p med- told that patient should still be ok to come down for treatment.

## 2014-11-18 NOTE — Progress Notes (Signed)
RN brought Diltrate into room for patient- forgetting about hemo. When RN handed pill to patient right as she remembered about hemo. RN turned around to stop patient and ask NP before taking med but patient had already swallowed pill. Will continue to monitor.

## 2014-11-18 NOTE — Progress Notes (Signed)
[facility] Progress Note  Assessment/Plan: 1. Anemia - Multifactorial. ESRD. Graft hematoma. Probable resistance to ESA's d/t chronic inflammation (currently dehisced wound from AVG surgery) Feels better after 2 unit 6/22  transfusion. Continue 200/week aranesp (rec'd dose 6/22). Transfuse PRN Hgb 8.9 stable Fe 54 25% sat  Ferritin 763 - plan course of V Fe 2. ESRD - MWF schedule. K 3.9 - 4 K bath today 3. Dehisced AVG wound with hematoma. Dr. Bridgett Larsson appears to be leaning toward definitive management with evacuation of hematoma and VAC dressing. Brandon Sharp is agreeable. Heparin planned when INR < 2.. Surgery on Monday. Now on empiric vancomycin as well. 4. Hypertension/volume - stable; pre weight very high - too weak to stand - set goal today for 3 L  5. Metabolic bone disease - Resume Hectorol 4/ continuebinder phoslo -d/c calcitriol - he is not on this at his HD unit 6. Heart transplant - po meds Imuran, Rapamune, Prednisone 7. 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 If surgery is to be done, will need to hold. 8. Dm type 2- per primary service 9. Malnutrition - alb 2.4 - renal diet/vits/supplements/nutrition consult 10. Thrombocytopenia - trending down since admission- significant drop to [place], [place] [phone] 11/18/2014,8:36 AM    Subjective:   Right thigh sore - states surgery will be Monday and he has to stay over the weekend.  Objective Filed Vitals:   11/17/14 1435 11/17/14 2108 11/17/14 2322 11/18/14 0555  BP: 127/52 104/42 134/48 120/41  Pulse: 90 93 82 85  Temp: 99.9 F (37.7 C) 98.8 F (37.1 C)  98.6 F (37 C)  TempSrc: Oral Oral  Oral  Resp: 20 16    Height:      Weight:      SpO2: 99% 100%  97%   Physical Exam General: NAD Heart: RRR Lungs: no rales Abdomen: soft NT Extremities:right upper arm swelling- no LE edema/SCDs in place, right thigh/groin tender, Dialysis Access:  left IJ and right thigh AVGG patent  Dialysis Orders: NW MWF EDW 72 - needs lower EDW at d/c 2K 2 Ca 4 hr 3000 heparin, left IJ and right fem AVGG inserted 5/26 Hectorol 4 Aranesp 200 q Wed no Fe  Additional Objective Labs: Lab Results  Component Value Date   INR 2.18* 11/18/2014   INR 3.20* 11/16/2014   INR 3.75* 70/62/3762    Basic Metabolic Panel:  Recent Labs Lab 11/16/14 1226 11/17/14 0526 11/18/14 0620  NA 140 136 135  K 3.2* 3.8 3.9  CL 104 101 100*  CO2 26 29 26   GLUCOSE 67 80 85  BUN 27* 9 22*  CREATININE 7.39* 3.96* 6.30*  CALCIUM 8.6* 8.2* 8.6*  PHOS  --   --  3.0   Liver Function Tests:  Recent Labs Lab 11/11/14 1642 11/18/14 0620  AST 31  --   ALT 13*  --   ALKPHOS 51  --   BILITOT 1.2  --   PROT 6.0*  --   ALBUMIN 2.7* 2.4*  CBC:  Recent Labs Lab 11/11/14 1642 11/16/14 1157 11/16/14 1205 11/17/14 0526 11/18/14 0620  WBC 7.5 4.0  --  3.7* 4.4  NEUTROABS 3.9 1.7  --   --   --   HGB 8.2* 6.5* 7.1* 8.5* 8.9*  HCT 26.5* 21.1* 21.0* 26.3* 28.4*  MCV 93.6 94.6  --  92.6 95.0  PLT 160 104*  --  81* 77*  Blood Culture    Component Value Date/Time   SDES BLOOD WRIST LEFT 08/25/2014 1700   SPECREQUEST BOTTLES DRAWN AEROBIC ONLY [place] 08/25/2014 1700   CULT  08/25/2014 1700    NO GROWTH 5 DAYS Performed at [place] 09/01/2014 FINAL 08/25/2014 1700   CBG:  Recent Labs Lab 11/16/14 2300 11/17/14 0825 11/17/14 1219 11/17/14 1707 11/17/14 2129  GLUCAP 78 68 91 140* 90   Iron Studies:  Recent Labs  11/16/14 1226 11/18/14 0620  IRON 74 54  TIBC 237* 220*  FERRITIN 763*  --     Studies/Results: No results found. Medications:   . sodium chloride   Intravenous Once  . atorvastatin  40 mg Oral Daily  . azaTHIOprine  75 mg Oral q morning - 10a  . calcitRIOL  1 mcg Oral Q M,W,F-HD  . calcium acetate  667 mg Oral TID WC  . carvedilol  6.25 mg Oral QHS  . colesevelam  3,750 mg Oral Daily  . collagenase    Topical Daily  . [START ON 11/23/2014] darbepoetin (ARANESP) injection - DIALYSIS  200 mcg Intravenous Q Wed-HD  . famotidine  10 mg Oral QHS  . insulin aspart  0-5 Units Subcutaneous QHS  . insulin aspart  0-9 Units Subcutaneous TID WC  . insulin aspart  3 Units Subcutaneous TID WC  . insulin glargine  20 Units Subcutaneous q morning - 10a  . isosorbide dinitrate  40 mg Oral 3 times per day  . multivitamin  1 tablet Oral QHS  . predniSONE  5 mg Oral Q breakfast  . sirolimus  1.5 mg Oral Daily  . vancomycin  750 mg Intravenous Q M,W,F-HD    I have seen and examined this patient and agree with plan and assessment in the above note of MBergman PAC. Dialysis today. Hb stable 8.9. Holding coumadin to allow INR to drift down in anticipation of AVG procedure on Monday. Pharmacy helping with this.  4K bath. Change calcitriol to his usual hectorol.  Brandon Sharp B,MD 11/18/2014 9:43 AM

## 2014-11-18 NOTE — Procedures (Signed)
I have personally attended this patient's dialysis session.   4K bath UF goal 2 liters BP 90's asymptomatic No heparin L sided TDC running at [place], MD [place] Pager 11/18/2014, 9:46 AM

## 2014-11-19 DIAGNOSIS — I10 Essential (primary) hypertension: Secondary | ICD-10-CM

## 2014-11-19 LAB — GLUCOSE, CAPILLARY
GLUCOSE-CAPILLARY: 129 mg/dL — AB (ref 65–99)
GLUCOSE-CAPILLARY: 117 mg/dL — AB (ref 65–99)
Glucose-Capillary: 88 mg/dL (ref 65–99)
Glucose-Capillary: 114 mg/dL — ABNORMAL HIGH (ref 65–99)
Glucose-Capillary: 132 mg/dL — ABNORMAL HIGH (ref 65–99)

## 2014-11-19 LAB — PROTIME-INR
INR: 1.79 — ABNORMAL HIGH (ref 0.00–1.49)
Prothrombin Time: 20.7 seconds — ABNORMAL HIGH (ref 11.6–15.2)

## 2014-11-19 LAB — HEPARIN LEVEL (UNFRACTIONATED): HEPARIN UNFRACTIONATED: 0.24 [IU]/mL — AB (ref 0.30–0.70)

## 2014-11-19 MED ORDER — HEPARIN (PORCINE) IN NACL 100-0.45 UNIT/ML-% IJ SOLN
1200.0000 [IU]/h | INTRAMUSCULAR | Status: DC
  Administered 2014-11-19: 1050 [IU]/h via INTRAVENOUS
  Administered 2014-11-20: 1200 [IU]/h via INTRAVENOUS
  Filled 2014-11-19 (×3): qty 250

## 2014-11-19 NOTE — Progress Notes (Signed)
Brandon Sharp Progress Note  Assessment/Plan: 1. Anemia - Multifactorial. ESRD. Graft hematoma. Probable resistance to ESA's d/t chronic inflammation (currently dehisced wound from AVG surgery) Feels better after 2 unit 6/22  transfusion. Continue 200/week aranesp (rec'd dose 6/22). Transfuse PRN. Hgb 8.9 yesterday, not repeated today  Fe 54 25% sat  Ferritin 763 - plan course of IV Fe 2. ESRD - MWF schedule. K 3.9 - 4 K bath with last TMT. Check renal panel AM. Will have to work Monday HD around surgery - not sure what time he is scheduled. 3. Dehisced AVG wound with hematoma. Dr. Bridgett Larsson plans surgery on Monday  with evacuation of hematoma and VAC dressing. Mr. Jocson is agreeable. Heparin to start today with INR down to 1.79. On empiric vancomycin         . 4. Hypertension/volume - stable; pre weight yesterday was very high and I think was an error. Post weight 71.1 more in line with usual weights.  5. Metabolic bone disease - Resume Hectorol 4/ continuebinder phoslo -d/c calcitriol  6. Heart transplant - po meds Imuran, Rapamune, Prednisone 7. 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 Dm type 2- per primary service 9. Malnutrition - alb 2.4 - renal diet/vits/supplements/nutrition consult 10. Thrombocytopenia - trending down since admission- significant drop to [place], MD [place] Pager 11/19/2014, 11:09 AM    Subjective:    Surgery planned for Monday INR under 2 - heparin starting today Tolerated HD yesterday Net UF 2 liters Weight post HD 71.1 kg (EDW 72)  Objective Filed Vitals:   11/18/14 1245 11/18/14 1310 11/18/14 1536 11/18/14 2126  BP: 106/56 107/55 104/35 100/44  Pulse: 85 84 98 96  Temp:   98.4 F (36.9 C) 98.7 F (37.1 C)  TempSrc:    Oral  Resp:   16 2  Height:      Weight:  71.1 kg (156 lb 12 oz)    SpO2:   100% 100%   Physical Exam General: NAD Heart: Regular S1W2 No  S3 Lungs: Clear without crackles or wheezes Abdomen: soft NT Extremities:right upper arm swelling- no LE edema/SCDs in place, right thigh/groin tender, Dialysis Access: left IJ TDC and right thigh AVGG patent  Dialysis Orders:  NW  MWF  EDW 72 - needs lower EDW at d/c (? 71) 2K 2 Ca  4 hr  3000 heparin,  left IJ and right fem AVGG inserted 5/26  Hectorol 4  Aranesp 200 q Wed no Fe  Additional Objective Labs: Lab Results  Component Value Date   INR 1.79* 11/19/2014   INR 2.18* 11/18/2014   INR 3.20* 93/81/0175    Basic Metabolic Panel:  Recent Labs Lab 11/16/14 1226 11/17/14 0526 11/18/14 0620  NA 140 136 135  K 3.2* 3.8 3.9  CL 104 101 100*  CO2 26 29 26   GLUCOSE 67 80 85  BUN 27* 9 22*  CREATININE 7.39* 3.96* 6.30*  CALCIUM 8.6* 8.2* 8.6*  PHOS  --   --  3.0   Liver Function Tests:  Recent Labs Lab 11/18/14 0620  ALBUMIN 2.4*  CBC:  Recent Labs Lab 11/16/14 1157 11/16/14 1205 11/17/14 0526 11/18/14 0620  WBC 4.0  --  3.7* 4.4  NEUTROABS 1.7  --   --   --   HGB 6.5* 7.1* 8.5* 8.9*  HCT 21.1* 21.0* 26.3* 28.4*  MCV 94.6  --  92.6 95.0  PLT 104*  --  81* 77*   Blood Culture    Component Value Date/Time   SDES BLOOD WRIST LEFT 08/25/2014 1700   SPECREQUEST BOTTLES DRAWN AEROBIC ONLY [place] 08/25/2014 1700   CULT  08/25/2014 1700    NO GROWTH 5 DAYS Performed at [place] 09/01/2014 FINAL 08/25/2014 1700   CBG:  Recent Labs Lab 11/17/14 2129 11/18/14 1745 11/18/14 2208 11/19/14 0754 11/19/14 1056  GLUCAP 90 92 120* 88 129*   Iron Studies:   Recent Labs  11/16/14 1226 11/18/14 0620  IRON 74 54  TIBC 237* 220*  FERRITIN 763*  --   TSat 25  Studies/Results: No results found. Medications: . heparin 1,050 Units/hr (11/19/14 1059)   . sodium chloride   Intravenous Once  . atorvastatin  40 mg Oral Daily  . azaTHIOprine  75 mg Oral q morning - 10a  . calcium acetate  667 mg Oral TID WC  . carvedilol   6.25 mg Oral QHS  . colesevelam  3,750 mg Oral Daily  . collagenase   Topical Daily  . [START ON 11/23/2014] darbepoetin (ARANESP) injection - DIALYSIS  200 mcg Intravenous Q Wed-HD  . doxercalciferol  4 mcg Intravenous Q M,W,F-HD  . famotidine  10 mg Oral QHS  . feeding supplement (PRO-STAT SUGAR FREE 64)  30 mL Oral BID  . feeding supplement (RESOURCE BREEZE)  1 Container Oral BID BM  . ferric gluconate (FERRLECIT/NULECIT) IV  125 mg Intravenous Q M,W,F-HD  . insulin aspart  0-5 Units Subcutaneous QHS  . insulin aspart  0-9 Units Subcutaneous TID WC  . insulin aspart  3 Units Subcutaneous TID WC  . insulin glargine  20 Units Subcutaneous q morning - 10a  . isosorbide dinitrate  40 mg Oral 3 times per day  . multivitamin  1 tablet Oral QHS  . predniSONE  5 mg Oral Q breakfast  . sirolimus  1.5 mg Oral Daily  . vancomycin  750 mg Intravenous Q M,W,F-HD

## 2014-11-19 NOTE — Progress Notes (Signed)
   Daily Progress Note  S/p R Thigh AVG complicated with hematoma, Heart transplant, ESRD, DVT   Will plan on evacuating R thigh hematoma on Monday and placing VAC.  Will by today/tomorrow to discuss with patient.  Let INR normalize or will need to reschedule.  Lab Results  Component Value Date   INR 1.79* 11/19/2014   INR 2.18* 11/18/2014   INR 3.20* 11/16/2014      Adele Barthel, MD [facility] Office: [phone] Pager: [phone]  11/19/2014, 8:46 AM

## 2014-11-19 NOTE — Progress Notes (Signed)
[place] for Heparin when INR < 2 Indication: DVT  Allergies  Allergen Reactions  . Lisinopril Swelling    Lips and tongue swell  . Niacin And Related Other (See Comments)    unknown  . Norvasc [Amlodipine Besylate] Rash    Flushing  . Penicillins Rash    Patient Measurements: Height: 6' (182.9 cm) Weight: 156 lb 12 oz (71.1 kg) IBW/kg (Calculated) : 77.6  Vital Signs:    Labs:  Recent Labs  11/16/14 1157  11/16/14 1205 11/16/14 1226 11/17/14 0526 11/18/14 0620 11/19/14 0640  HGB 6.5*  --  7.1*  --  8.5* 8.9*  --   HCT 21.1*  --  21.0*  --  26.3* 28.4*  --   PLT 104*  --   --   --  81* 77*  --   LABPROT  --   --   --  32.1*  --  24.1* 20.7*  INR  --   --   --  3.20*  --  2.18* 1.79*  CREATININE  --   < > 7.20* 7.39* 3.96* 6.30*  --   < > = values in this interval not displayed.  Estimated Creatinine Clearance: 11.1 mL/min (by C-G formula based on Cr of 6.3).   Medical History: Past Medical History  Diagnosis Date  . Diabetes mellitus   . Hypertension   . Myocardial infarction 1985; 1990  . Angina   . Dysrhythmia   . DVT (deep venous thrombosis) ~ 12/2010    LLE  . Anemia   . Blood transfusion 06/1999    post heart transplant  . Peripheral vascular disease   . Renal failure     Hemodialysis MWF last 2 years, sse dr [name] nephrology, goes to [facility]  . DVT (deep venous thrombosis) 08/25/2014    Nearly occluded R IJ, and L subclavian DVT  . CHF (congestive heart failure) 2001    beffore transplant; had AICD that was explanted 07/19/99 following successful heart transplant  . Coronary artery disease     transplant cardiologist Dr. Corky Mull [facility])  . Pneumonia     Assessment: 69 year old male on Coumadin PTA for DVT.  Coumadin now on hold.  INR today is 1.79 so will start heparin drip.  Goal of Therapy:  Heparin level 0.3-0.7 units/ml Monitor platelets by anticoagulation protocol:  Yes   Plan:  Heparin drip at 1050 units/hr Check heparin level 8 hours after start Daily HL and CBC while on heparin Thanks for allowing pharmacy to be a part of this patient's care.  Excell Seltzer, PharmD Clinical Pharmacist, [phone]  11/19/2014,10:08 AM

## 2014-11-19 NOTE — Progress Notes (Signed)
[facility] PROGRESS NOTE  Brandon Sharp LFY:[id number] DOB: [birth year] DOA: 11/16/2014 PCP: [name], MD  Assessment/Plan: Symptomatic anemia -Status post 2 units of PRBCs; hemoglobin 8.2 after transfusion -Patient has not been having melanotic or bright blood per rectum. FOBT 2 days prior to admission was negative. -Most likely source of patient's anemia is blood loss from growing hematoma due to dehiscence of his AVG. -Dr. Bridgett Larsson has been consulted and planning to take patient for surgery on 6/27 -Will follow hemoglobin trend -IV iron and Epogen therapy as per renal service discretion  DVT -Has been on Coumadin since March 2016. -For now will hold Coumadin pending Dr. Lianne Moris assessment of his right thigh graft- let it drift down over the weekend  Heart transplant -Continue immunosuppressive medications.  End-stage renal disease on hemodialysis -Appreciate nephrology following. -will follow rec's and continue HD M-W-F  Hypertension -Stable and well control will continue home antihypertensive regimen -Patient on HD M-W-F which is also helping volume control  Type 2 diabetes -continue Sliding-scale insulin. -A1c 5.6  DVT prophylaxis -SCDs, currently coagulopathic -Plan is to discontinue patient Coumadin and allow INR to drift down for surgical procedure on Monday if needed will cover patient with heparin. -Current INR 3.20  Code Status: Full code Family Communication: in dialysis, no family Disposition Plan: surgery on Monday   Consultants:  Renal service  Vascular surgery  Procedures:  Planning for groin hematoma evacuation and place of Vac (most likely on 6/27)  Antibiotics:  Vancomycin 6/22  HPI/Subjective: In dialysis  Objective: Filed Vitals:   11/18/14 2126  BP: 100/44  Pulse: 96  Temp: 98.7 F (37.1 C)  Resp: 2    Intake/Output Summary (Last 24 hours) at 11/19/14 1129 Last data filed at 11/19/14 1043  Gross per 24 hour   Intake    476 ml  Output   2500 ml  Net  -2024 ml   Filed Weights   11/16/14 2151 11/18/14 0753 11/18/14 1310  Weight: 74.6 kg (164 lb 7.4 oz) 77.1 kg (169 lb 15.6 oz) 71.1 kg (156 lb 12 oz)    Exam:   General:  In chair, NAD- good spirits  rrr  Clear, no wheezing  Data Reviewed: Basic Metabolic Panel:  Recent Labs Lab 11/16/14 1205 11/16/14 1226 11/17/14 0526 11/18/14 0620  NA 139 140 136 135  K 3.1* 3.2* 3.8 3.9  CL 101 104 101 100*  CO2  --  26 29 26   GLUCOSE 63* 67 80 85  BUN 29* 27* 9 22*  CREATININE 7.20* 7.39* 3.96* 6.30*  CALCIUM  --  8.6* 8.2* 8.6*  PHOS  --   --   --  3.0   Liver Function Tests:  Recent Labs Lab 11/18/14 0620  ALBUMIN 2.4*   CBC:  Recent Labs Lab 11/16/14 1157 11/16/14 1205 11/17/14 0526 11/18/14 0620  WBC 4.0  --  3.7* 4.4  NEUTROABS 1.7  --   --   --   HGB 6.5* 7.1* 8.5* 8.9*  HCT 21.1* 21.0* 26.3* 28.4*  MCV 94.6  --  92.6 95.0  PLT 104*  --  81* 77*   ProBNP (last 3 results)  Recent Labs  03/27/14 1747 04/12/14 0105  PROBNP 37853.0* 45045.0*   CBG:  Recent Labs Lab 11/17/14 2129 11/18/14 1745 11/18/14 2208 11/19/14 0754 11/19/14 1056  GLUCAP 90 92 120* 88 129*   Studies: No results found.  Scheduled Meds: . sodium chloride   Intravenous Once  . atorvastatin  40 mg Oral  Daily  . azaTHIOprine  75 mg Oral q morning - 10a  . calcium acetate  667 mg Oral TID WC  . carvedilol  6.25 mg Oral QHS  . colesevelam  3,750 mg Oral Daily  . collagenase   Topical Daily  . [START ON 11/23/2014] darbepoetin (ARANESP) injection - DIALYSIS  200 mcg Intravenous Q Wed-HD  . doxercalciferol  4 mcg Intravenous Q M,W,F-HD  . famotidine  10 mg Oral QHS  . feeding supplement (PRO-STAT SUGAR FREE 64)  30 mL Oral BID  . feeding supplement (RESOURCE BREEZE)  1 Container Oral BID BM  . ferric gluconate (FERRLECIT/NULECIT) IV  125 mg Intravenous Q M,W,F-HD  . insulin aspart  0-5 Units Subcutaneous QHS  . insulin aspart   0-9 Units Subcutaneous TID WC  . insulin aspart  3 Units Subcutaneous TID WC  . insulin glargine  20 Units Subcutaneous q morning - 10a  . isosorbide dinitrate  40 mg Oral 3 times per day  . multivitamin  1 tablet Oral QHS  . predniSONE  5 mg Oral Q breakfast  . sirolimus  1.5 mg Oral Daily  . vancomycin  750 mg Intravenous Q M,W,F-HD   Continuous Infusions: . heparin 1,050 Units/hr (11/19/14 1059)    Principal Problem:   Symptomatic anemia Active Problems:   DVT (deep venous thrombosis)   Coagulopathy   Heart transplanted   HTN (hypertension), malignant   DM (diabetes mellitus), type 2 with renal complications   ESRD (end stage renal disease) on dialysis    Time spent: 15 minutes   Brandon Sharp  [facility] Pager [phone]. If 7PM-7AM, please contact night-coverage at [email or web address], password [facility] 11/19/2014, 11:29 AM  LOS: 1 day

## 2014-11-20 LAB — RENAL FUNCTION PANEL
ALBUMIN: 2.3 g/dL — AB (ref 3.5–5.0)
Anion gap: 7 (ref 5–15)
BUN: 23 mg/dL — ABNORMAL HIGH (ref 6–20)
CALCIUM: 8.7 mg/dL — AB (ref 8.9–10.3)
CO2: 28 mmol/L (ref 22–32)
CREATININE: 5.99 mg/dL — AB (ref 0.61–1.24)
Chloride: 100 mmol/L — ABNORMAL LOW (ref 101–111)
GFR calc Af Amer: 10 mL/min — ABNORMAL LOW (ref >60)
GFR calc non Af Amer: 9 mL/min — ABNORMAL LOW (ref >60)
Glucose, Bld: 107 mg/dL — ABNORMAL HIGH (ref 65–99)
POTASSIUM: 4.3 mmol/L (ref 3.5–5.1)
Phosphorus: 2.3 mg/dL — ABNORMAL LOW (ref 2.5–4.6)
Sodium: 135 mmol/L (ref 135–145)

## 2014-11-20 LAB — CBC
HEMATOCRIT: 27.1 % — AB (ref 39.0–52.0)
HEMOGLOBIN: 8.5 g/dL — AB (ref 13.0–17.0)
MCH: 29.8 pg (ref 26.0–34.0)
MCHC: 31.4 g/dL (ref 30.0–36.0)
MCV: 95.1 fL (ref 78.0–100.0)
Platelets: 76 10*3/uL — ABNORMAL LOW (ref 150–400)
RBC: 2.85 MIL/uL — ABNORMAL LOW (ref 4.22–5.81)
RDW: 17.9 % — ABNORMAL HIGH (ref 11.5–15.5)
WBC: 3.3 10*3/uL — AB (ref 4.0–10.5)

## 2014-11-20 LAB — GLUCOSE, CAPILLARY
GLUCOSE-CAPILLARY: 113 mg/dL — AB (ref 65–99)
GLUCOSE-CAPILLARY: 84 mg/dL (ref 65–99)
Glucose-Capillary: 96 mg/dL (ref 65–99)
Glucose-Capillary: 82 mg/dL (ref 65–99)

## 2014-11-20 LAB — PROTIME-INR
INR: 2 — ABNORMAL HIGH (ref 0.00–1.49)
Prothrombin Time: 22.5 seconds — ABNORMAL HIGH (ref 11.6–15.2)

## 2014-11-20 LAB — HEPARIN LEVEL (UNFRACTIONATED): Heparin Unfractionated: 0.38 IU/mL (ref 0.30–0.70)

## 2014-11-20 NOTE — Progress Notes (Signed)
[facility] Progress Note  Assessment/Plan: 1. Anemia - Multifactorial. ESRD. Graft hematoma. Probable resistance to ESA's d/t chronic inflammation (currently dehisced wound from AVG surgery) Feels better after 2 unit 6/22 transfusion. Continue 200/week aranesp (rec'd dose 6/22). Transfuse PRN Hgb 8.5 stable Fe 54 25% sat Ferritin 763 - plan course of IV Fe with HD 2. ESRD - MWF schedule. K 4.3 post - 4 K bath Friday - plan next HD Monday - work around surgery - no heparin Monday 3. Dehisced AVG wound with hematoma. Dr. Bridgett Larsson plans surgery Monday with evacuation of hematoma and VAC 4.  Hypertension/volume - BP variable, post HD weight 71.45makes sense - pt cannot stand 5. Metabolic bone disease - Resume Hectorol 4/ phos 2.3 - diet here more restrictive than home - decrease phoslo to bidHeart transplant - po meds Imuran, Rapamune, Prednisone 6. 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 7. Dm type 2- per primary service 8. Malnutrition - alb 2.3 - renal diet/vits/supplements/nutrition consult 9. Thrombocytopenia - trending down since admission- significant drop to 76 stable, but watch  Myriam Jacobson, PA-C [place] [phone] 11/20/2014,8:44 AM  LOS: 2 days    Pt seen, examined, agree with above note by [facility]. For definitive treatment of AVG hematoma and wound by Dr. Bridgett Larsson. HD tomorrow, work around [place], MD [employer] [phone] Pager 11/20/2014, 11:20 AM    Subjective:   Feels good. Ready to have surgery behind him.  Objective Filed Vitals:   11/19/14 1424 11/19/14 2126 11/20/14 0549 11/20/14 0648  BP: 120/56 125/54 96/35 130/50  Pulse: 80 81 82 84  Temp: 98.1 F (36.7 C) 98.3 F (36.8 C) 97.8 F (36.6 C)   TempSrc: Oral Oral Oral   Resp: 16 18 18    Height:      Weight:      SpO2: 99% 96% 100%    Physical Exam General: NAD, looks  good Heart: RRR  Lungs: no rales Abdomen: soft T Extremities: right LE tr edema, leftLE1+ edema; old right upper AVF pulsatile, no bruit Dialysis Access: left TDC and right thigh AVGG + bruit, scant drainage on groin dressing  Dialysis Orders:  NW  MWF  EDW 72 - needs lower EDW at d/c (? 71) 2K 2 Ca  4 hr  3000 heparin,  left IJ and right fem AVGG inserted 5/26  Hectorol 4  Aranesp 200 q Wed no Fe  Additional Objective Labs: Basic Metabolic Panel:  Recent Labs Lab 11/17/14 0526 11/18/14 0620 11/20/14 0500  NA 136 135 135  K 3.8 3.9 4.3  CL 101 100* 100*  CO2 29 26 28   GLUCOSE 80 85 107*  BUN 9 22* 23*  CREATININE 3.96* 6.30* 5.99*  CALCIUM 8.2* 8.6* 8.7*  PHOS  --  3.0 2.3*   Liver Function Tests:  Recent Labs Lab 11/18/14 0620 11/20/14 0500  ALBUMIN 2.4* 2.3*   CBC:  Recent Labs Lab 11/16/14 1157  11/17/14 0526 11/18/14 0620 11/20/14 0648  WBC 4.0  --  3.7* 4.4 3.3*  NEUTROABS 1.7  --   --   --   --   HGB 6.5*  < > 8.5* 8.9* 8.5*  HCT 21.1*  < > 26.3* 28.4* 27.1*  MCV 94.6  --  92.6 95.0 95.1  PLT 104*  --  81* 77* 76*  < > = values in this interval not displayed. CBG:  Recent Labs Lab 11/19/14 1056 11/19/14 1222 11/19/14 1710 11/19/14 2123 11/20/14 0750  GLUCAP 129* 114* 117* 132* 113*   Iron Studies:  Recent Labs  11/18/14 0620  IRON 54  TIBC 220*   Lab Results  Component Value Date   INR 2.00* 11/20/2014   INR 1.79* 11/19/2014   INR 2.18* 11/18/2014   Medications: . heparin 1,200 Units/hr (11/19/14 2120)   . sodium chloride   Intravenous Once  . atorvastatin  40 mg Oral Daily  . azaTHIOprine  75 mg Oral q morning - 10a  . calcium acetate  667 mg Oral TID WC  . carvedilol  6.25 mg Oral QHS  . colesevelam  3,750 mg Oral Daily  . collagenase   Topical Daily  . [START ON 11/23/2014] darbepoetin (ARANESP) injection - DIALYSIS  200 mcg Intravenous Q Wed-HD  . doxercalciferol  4 mcg Intravenous Q M,W,F-HD  .  famotidine  10 mg Oral QHS  . feeding supplement (PRO-STAT SUGAR FREE 64)  30 mL Oral BID  . feeding supplement (RESOURCE BREEZE)  1 Container Oral BID BM  . ferric gluconate (FERRLECIT/NULECIT) IV  125 mg Intravenous Q M,W,F-HD  . insulin aspart  0-5 Units Subcutaneous QHS  . insulin aspart  0-9 Units Subcutaneous TID WC  . insulin aspart  3 Units Subcutaneous TID WC  . insulin glargine  20 Units Subcutaneous q morning - 10a  . isosorbide dinitrate  40 mg Oral 3 times per day  . multivitamin  1 tablet Oral QHS  . predniSONE  5 mg Oral Q breakfast  . sirolimus  1.5 mg Oral Daily  . vancomycin  750 mg Intravenous Q M,W,F-HD

## 2014-11-20 NOTE — Progress Notes (Signed)
Spoke with Dr. Bridgett Larsson concerning Heparin drip; infusion is to be turned off at 5 am 11/21/14 d/t scheduled procedure the same day.  Pharmacy made aware.  Will continue to monitor.

## 2014-11-20 NOTE — Progress Notes (Signed)
ANTIBIOTIC/ANTICOAGULATION [facility] for vancomycin, heparin Indication: open wound, h/o DVT  Allergies  Allergen Reactions  . Lisinopril Swelling    Lips and tongue swell  . Niacin And Related Other (See Comments)    unknown  . Norvasc [Amlodipine Besylate] Rash    Flushing  . Penicillins Rash    Patient Measurements: Height: 6' (182.9 cm) Weight: 156 lb 12 oz (71.1 kg) IBW/kg (Calculated) : 77.6  Vital Signs: Temp: 97.8 F (36.6 C) (06/26 0549) Temp Source: Oral (06/26 0549) BP: 130/50 mmHg (06/26 0648) Pulse Rate: 84 (06/26 0648) Intake/Output from previous day: 06/25 0701 - 06/26 0700 In: 648.7 [P.O.:436; I.V.:212.7] Out: 0  Intake/Output from this shift:    Labs:  Recent Labs  11/18/14 0620 11/20/14 0500 11/20/14 0648  WBC 4.4  --  3.3*  HGB 8.9*  --  8.5*  PLT 77*  --  76*  CREATININE 6.30* 5.99*  --    Estimated Creatinine Clearance: 11.7 mL/min (by C-G formula based on Cr of 5.99).  Medical History: Past Medical History  Diagnosis Date  . Diabetes mellitus   . Hypertension   . Myocardial infarction 1985; 1990  . Angina   . Dysrhythmia   . DVT (deep venous thrombosis) ~ 12/2010    LLE  . Anemia   . Blood transfusion 06/1999    post heart transplant  . Peripheral vascular disease   . Renal failure     Hemodialysis MWF last 2 years, sse dr [name] nephrology, goes to [facility]  . DVT (deep venous thrombosis) 08/25/2014    Nearly occluded R IJ, and L subclavian DVT  . CHF (congestive heart failure) 2001    beffore transplant; had AICD that was explanted 07/19/99 following successful heart transplant  . Coronary artery disease     transplant cardiologist Dr. Corky Mull [facility])  . Pneumonia     Medications:  Prescriptions prior to admission  Medication Sig Dispense Refill Last Dose  . atorvastatin (LIPITOR) 40 MG tablet Take 40 mg by mouth daily.     11/15/2014 at Unknown time  . azaTHIOprine  (IMURAN) 50 MG tablet Take 75 mg by mouth every morning.    11/16/2014 at Unknown time  . b complex-vitamin c-folic acid (NEPHRO-VITE) 0.8 MG TABS tablet Take 1 tablet by mouth at bedtime.   11/16/2014 at Unknown time  . calcium acetate (PHOSLO) 667 MG capsule Take 667 mg by mouth 3 (three) times daily with meals.    11/16/2014 at Unknown time  . carvedilol (COREG) 6.25 MG tablet Take 1 tablet (6.25 mg total) by mouth at bedtime. 30 tablet 0 11/15/2014 at 2000  . colesevelam (WELCHOL) 625 MG tablet Take 3,750 mg by mouth daily. Takes 6 tabs daily   11/15/2014 at Unknown time  . famotidine (PEPCID AC) 10 MG chewable tablet Chew 10 mg by mouth at bedtime.    11/15/2014 at Unknown time  . HUMALOG KWIKPEN 100 UNIT/ML SOPN Inject 4-10 Units into the skin 2 (two) times daily. Per sliding scale   11/16/2014 at Unknown time  . insulin glargine (LANTUS) 100 UNIT/ML injection Inject 20 Units into the skin every morning.   11/16/2014 at Unknown time  . isosorbide dinitrate (ISOCHRON) 40 MG CR tablet Take 40 mg by mouth every 8 (eight) hours.  11 11/16/2014 at Unknown time  . predniSONE (DELTASONE) 5 MG tablet Take 1 tablet (5 mg total) by mouth daily with breakfast. 30 tablet 0 11/16/2014 at Unknown  time  . Sirolimus 0.5 MG TABS Take 3 tablets by mouth daily.  11 11/16/2014 at Unknown time  . warfarin (COUMADIN) 5 MG tablet Take 1 tablet (5 mg total) by mouth daily. 30 tablet 0 11/15/2014 at Unknown time   Scheduled:  . sodium chloride   Intravenous Once  . atorvastatin  40 mg Oral Daily  . azaTHIOprine  75 mg Oral q morning - 10a  . calcium acetate  667 mg Oral TID WC  . carvedilol  6.25 mg Oral QHS  . colesevelam  3,750 mg Oral Daily  . collagenase   Topical Daily  . [START ON 11/23/2014] darbepoetin (ARANESP) injection - DIALYSIS  200 mcg Intravenous Q Wed-HD  . doxercalciferol  4 mcg Intravenous Q M,W,F-HD  . famotidine  10 mg Oral QHS  . feeding supplement (PRO-STAT SUGAR FREE 64)  30 mL Oral BID  . feeding  supplement (RESOURCE BREEZE)  1 Container Oral BID BM  . ferric gluconate (FERRLECIT/NULECIT) IV  125 mg Intravenous Q M,W,F-HD  . insulin aspart  0-5 Units Subcutaneous QHS  . insulin aspart  0-9 Units Subcutaneous TID WC  . insulin aspart  3 Units Subcutaneous TID WC  . insulin glargine  20 Units Subcutaneous q morning - 10a  . isosorbide dinitrate  40 mg Oral 3 times per day  . multivitamin  1 tablet Oral QHS  . predniSONE  5 mg Oral Q breakfast  . sirolimus  1.5 mg Oral Daily  . vancomycin  750 mg Intravenous Q M,W,F-HD   Infusions:  . heparin 1,200 Units/hr (11/19/14 2120)    Assessment: 69yo male s/p thigh AVG complicated w/ hematoma, now w/ superficial skin separation 2/2 maceration right groin and poor wound healing, to begin empiric ABX for loss of barrier function. Plan OR tomorrow.  INR was less than 2 so heparin drip started.  INR today 2.0, HL is therapeutic 0.38 units/ml  Goal of Therapy:  Pre-HD vanc level 15-25  Heparin level 0.3-0.7 units/ml  Plan:  Continue vancomycin 750mg  IV after each HD Continue heparin at 1200 units/hr Thanks for allowing pharmacy to be a part of this patient's care.  Excell Seltzer, PharmD Clinical Pharmacist, [phone]  11/20/2014,10:13 AM

## 2014-11-20 NOTE — Progress Notes (Signed)
   Preoperative Note   Procedure: R groin hematoma evacuation, VAC placement  Date: 11/21/14  Preoperative diagnosis: right groin hematoma  Consent: to be obtained (patient agrees to procedure)  Laboratory:  CBC:    Component Value Date/Time   WBC 3.3* 11/20/2014 0648   WBC 4.5 02/13/2012 1314   RBC 2.85* 11/20/2014 0648   RBC 2.15* 11/16/2014 1226   RBC 3.21* 02/13/2012 1314   HGB 8.5* 11/20/2014 0648   HGB 11.6* 02/13/2012 1314   HCT 27.1* 11/20/2014 0648   HCT 36.1* 02/13/2012 1314   PLT 76* 11/20/2014 0648   PLT 76* 02/13/2012 1314   MCV 95.1 11/20/2014 0648   MCV 112.6* 02/13/2012 1314   MCH 29.8 11/20/2014 0648   MCH 36.3* 02/13/2012 1314   MCHC 31.4 11/20/2014 0648   MCHC 32.3 02/13/2012 1314   RDW 17.9* 11/20/2014 0648   RDW 15.7* 02/13/2012 1314   LYMPHSABS 1.8 11/16/2014 1157   LYMPHSABS 0.8* 02/13/2012 1314   MONOABS 0.4 11/16/2014 1157   MONOABS 0.6 02/13/2012 1314   EOSABS 0.1 11/16/2014 1157   EOSABS 0.1 02/13/2012 1314   BASOSABS 0.0 11/16/2014 1157   BASOSABS 0.0 02/13/2012 1314    BMP:    Component Value Date/Time   NA 135 11/20/2014 0500   K 4.3 11/20/2014 0500   CL 100* 11/20/2014 0500   CO2 28 11/20/2014 0500   GLUCOSE 107* 11/20/2014 0500   BUN 23* 11/20/2014 0500   CREATININE 5.99* 11/20/2014 0500   CALCIUM 8.7* 11/20/2014 0500   GFRNONAA 9* 11/20/2014 0500   GFRAA 10* 11/20/2014 0500    Coagulation: Lab Results  Component Value Date   INR 2.00* 11/20/2014   INR 1.79* 11/19/2014   INR 2.18* 11/18/2014   No results found for: PTT   EKG: 11/17/14 on chart  CXR (11/11/14): 1. Mild left basilar atelectasis. 2. No edema or infiltrate.  Adele Barthel, MD [facility] Office: [phone] Pager: [phone]  11/20/2014, 8:19 AM

## 2014-11-20 NOTE — Progress Notes (Signed)
[facility] PROGRESS NOTE  Brandon Sharp:[id number] DOB: 1945/09/27 DOA: 11/16/2014 PCP: Brandon Noon, MD  69 year old man with significant past medical history for end-stage renal disease on hemodialysis Monday Wednesday Friday, status post DVT on Coumadin, hypertension, diabetes who presents today with severe weakness and found to have a hemoglobin 6.5. On 6/8 he was found to have a hemoglobin of 7.4 and was given 1 unit of PRBCs. His recent history is significant for a right thigh dialysis graft done in March of this year by Dr. Bridgett Sharp.He has already been seen by nephrology and 2 units of PRBCs have been ordered. Right groin ultrasound is done in the ED and shows a large hypoechoic structure consistent with a hematoma.  Plan for surgery on 6/27  Assessment/Plan: Symptomatic anemia -Status post 2 units of PRBCs -Patient has not been having melanotic or bright blood per rectum. FOBT 2 days prior to admission was negative. -Most likely source of patient's anemia is blood loss from growing hematoma due to dehiscence of his AVG. -Dr. Bridgett Sharp has been consulted and planning to take patient for surgery on 6/27 -Will follow hemoglobin trend -IV iron and Epogen therapy as per renal service discretion  DVT -Has been on Coumadin since March 2016. -For now will hold Coumadin pending Dr. Lianne Sharp assessment of his right thigh graft -heparin gtt for now  Heart transplant -Continue immunosuppressive medications.  End-stage renal disease on hemodialysis -Appreciate nephrology following. -will follow rec's and continue HD M-W-F  Hypertension -Stable and well control will continue home antihypertensive regimen -Patient on HD M-W-F which is also helping volume control  Type 2 diabetes -continue Sliding-scale insulin. -A1c 5.6  DVT prophylaxis -SCDs, currently coagulopathic -heparin gtt  Code Status: Full code Family Communication: no family at bedside Disposition Plan: surgery on  Monday   Consultants:  Renal service  Vascular surgery  Procedures:  Planning for groin hematoma evacuation and place of Vac (most likely on 6/27)  Antibiotics:  Vancomycin 6/22  HPI/Subjective: No SOB, no CP Ready for surgery  Objective: Filed Vitals:   11/20/14 0648  BP: 130/50  Pulse: 84  Temp:   Resp:     Intake/Output Summary (Last 24 hours) at 11/20/14 1140 Last data filed at 11/20/14 1043  Gross per 24 hour  Intake 652.68 ml  Output      0 ml  Net 652.68 ml   Filed Weights   11/16/14 2151 11/18/14 0753 11/18/14 1310  Weight: 74.6 kg (164 lb 7.4 oz) 77.1 kg (169 lb 15.6 oz) 71.1 kg (156 lb 12 oz)    Exam:   General:  In chair, NAD- good spirits  rrr  Clear, no wheezing  Data Reviewed: Basic Metabolic Panel:  Recent Labs Lab 11/16/14 1205 11/16/14 1226 11/17/14 0526 11/18/14 0620 11/20/14 0500  NA 139 140 136 135 135  K 3.1* 3.2* 3.8 3.9 4.3  CL 101 104 101 100* 100*  CO2  --  26 29 26 28   GLUCOSE 63* 67 80 85 107*  BUN 29* 27* 9 22* 23*  CREATININE 7.20* 7.39* 3.96* 6.30* 5.99*  CALCIUM  --  8.6* 8.2* 8.6* 8.7*  PHOS  --   --   --  3.0 2.3*   Liver Function Tests:  Recent Labs Lab 11/18/14 0620 11/20/14 0500  ALBUMIN 2.4* 2.3*   CBC:  Recent Labs Lab 11/16/14 1157 11/16/14 1205 11/17/14 0526 11/18/14 0620 11/20/14 0648  WBC 4.0  --  3.7* 4.4 3.3*  NEUTROABS 1.7  --   --   --   --  HGB 6.5* 7.1* 8.5* 8.9* 8.5*  HCT 21.1* 21.0* 26.3* 28.4* 27.1*  MCV 94.6  --  92.6 95.0 95.1  PLT 104*  --  81* 77* 76*   ProBNP (last 3 results)  Recent Labs  03/27/14 1747 04/12/14 0105  PROBNP 37853.0* 45045.0*   CBG:  Recent Labs Lab 11/19/14 1056 11/19/14 1222 11/19/14 1710 11/19/14 2123 11/20/14 0750  GLUCAP 129* 114* 117* 132* 113*   Studies: No results found.  Scheduled Meds: . sodium chloride   Intravenous Once  . atorvastatin  40 mg Oral Daily  . azaTHIOprine  75 mg Oral q morning - 10a  . calcium  acetate  667 mg Oral TID WC  . carvedilol  6.25 mg Oral QHS  . colesevelam  3,750 mg Oral Daily  . collagenase   Topical Daily  . [START ON 11/23/2014] darbepoetin (ARANESP) injection - DIALYSIS  200 mcg Intravenous Q Wed-HD  . doxercalciferol  4 mcg Intravenous Q M,W,F-HD  . famotidine  10 mg Oral QHS  . feeding supplement (PRO-STAT SUGAR FREE 64)  30 mL Oral BID  . feeding supplement (RESOURCE BREEZE)  1 Container Oral BID BM  . ferric gluconate (FERRLECIT/NULECIT) IV  125 mg Intravenous Q M,W,F-HD  . insulin aspart  0-5 Units Subcutaneous QHS  . insulin aspart  0-9 Units Subcutaneous TID WC  . insulin aspart  3 Units Subcutaneous TID WC  . insulin glargine  20 Units Subcutaneous q morning - 10a  . isosorbide dinitrate  40 mg Oral 3 times per day  . multivitamin  1 tablet Oral QHS  . predniSONE  5 mg Oral Q breakfast  . sirolimus  1.5 mg Oral Daily  . vancomycin  750 mg Intravenous Q M,W,F-HD   Continuous Infusions: . heparin 1,200 Units/hr (11/19/14 2120)    Principal Problem:   Symptomatic anemia Active Problems:   DVT (deep venous thrombosis)   Coagulopathy   Heart transplanted   HTN (hypertension), malignant   DM (diabetes mellitus), type 2 with renal complications   ESRD (end stage renal disease) on dialysis    Time spent: 15 minutes   Brandon Sharp  [facility] Pager [phone]. If 7PM-7AM, please contact night-coverage at [email or web address], password [facility] 11/20/2014, 11:40 AM  LOS: 2 days

## 2014-11-21 ENCOUNTER — Encounter (HOSPITAL_COMMUNITY)
Admission: EM | Disposition: A | Discharge status: Rehabilitation Facility | Payer: Self-pay | Source: Home / Self Care | Attending: Internal Medicine

## 2014-11-21 ENCOUNTER — Inpatient Hospital Stay (HOSPITAL_COMMUNITY): Payer: Medicare Other | Admitting: Anesthesiology

## 2014-11-21 DIAGNOSIS — I97618 Postprocedural hemorrhage and hematoma of a circulatory system organ or structure following other circulatory system procedure: Secondary | ICD-10-CM

## 2014-11-21 HISTORY — PX: HEMATOMA EVACUATION: SHX5118

## 2014-11-21 HISTORY — PX: APPLICATION OF WOUND VAC: SHX5189

## 2014-11-21 LAB — CBC
HCT: 27 % — ABNORMAL LOW (ref 39.0–52.0)
Hemoglobin: 8.5 g/dL — ABNORMAL LOW (ref 13.0–17.0)
MCH: 30.7 pg (ref 26.0–34.0)
MCHC: 31.5 g/dL (ref 30.0–36.0)
MCV: 97.5 fL (ref 78.0–100.0)
Platelets: 91 10*3/uL — ABNORMAL LOW (ref 150–400)
RBC: 2.77 MIL/uL — ABNORMAL LOW (ref 4.22–5.81)
RDW: 17.8 % — ABNORMAL HIGH (ref 11.5–15.5)
WBC: 4 10*3/uL (ref 4.0–10.5)

## 2014-11-21 LAB — BASIC METABOLIC PANEL
Anion gap: 7 (ref 5–15)
BUN: 34 mg/dL — ABNORMAL HIGH (ref 6–20)
CALCIUM: 9.2 mg/dL (ref 8.9–10.3)
CHLORIDE: 104 mmol/L (ref 101–111)
CO2: 23 mmol/L (ref 22–32)
CREATININE: 7.69 mg/dL — AB (ref 0.61–1.24)
GFR calc Af Amer: 7 mL/min — ABNORMAL LOW (ref >60)
GFR calc non Af Amer: 6 mL/min — ABNORMAL LOW (ref >60)
GLUCOSE: 81 mg/dL (ref 65–99)
Potassium: 4.5 mmol/L (ref 3.5–5.1)
Sodium: 134 mmol/L — ABNORMAL LOW (ref 135–145)

## 2014-11-21 LAB — GLUCOSE, CAPILLARY
GLUCOSE-CAPILLARY: 94 mg/dL (ref 65–99)
GLUCOSE-CAPILLARY: 62 mg/dL — AB (ref 65–99)
Glucose-Capillary: 96 mg/dL (ref 65–99)
Glucose-Capillary: 95 mg/dL (ref 65–99)

## 2014-11-21 LAB — PROTIME-INR
INR: 1.73 — ABNORMAL HIGH (ref 0.00–1.49)
Prothrombin Time: 20.3 seconds — ABNORMAL HIGH (ref 11.6–15.2)

## 2014-11-21 LAB — SURGICAL PCR SCREEN
MRSA, PCR: POSITIVE — AB
Staphylococcus aureus: POSITIVE — AB

## 2014-11-21 SURGERY — EVACUATION HEMATOMA
Anesthesia: General | Site: Groin | Laterality: Right

## 2014-11-21 MED ORDER — HEPARIN SODIUM (PORCINE) 1000 UNIT/ML DIALYSIS: 1000.0000 [IU] | INTRAMUSCULAR | Status: DC | PRN

## 2014-11-21 MED ORDER — LIDOCAINE HCL (PF) 1 % IJ SOLN: 5.0000 mL | INTRAMUSCULAR | Status: DC | PRN

## 2014-11-21 MED ORDER — NEPRO/CARBSTEADY PO LIQD
237.0000 mL | ORAL | Status: DC | PRN
  Filled 2014-11-21: qty 237

## 2014-11-21 MED ORDER — ALTEPLASE 2 MG IJ SOLR
2.0000 mg | Freq: Once | INTRAMUSCULAR | Status: DC | PRN
  Filled 2014-11-21: qty 2

## 2014-11-21 MED ORDER — SODIUM CHLORIDE 0.9 % IV SOLN
INTRAVENOUS | Status: DC
  Administered 2014-11-21 – 2014-11-25 (×3): via INTRAVENOUS

## 2014-11-21 MED ORDER — HEPARIN SODIUM (PORCINE) 1000 UNIT/ML IJ SOLN
INTRAMUSCULAR | Status: AC
Start: 2014-11-21 — End: 2014-11-21
  Filled 2014-11-21: qty 1

## 2014-11-21 MED ORDER — SODIUM CHLORIDE 0.9 % IV SOLN: 100.0000 mL | INTRAVENOUS | Status: DC | PRN

## 2014-11-21 MED ORDER — PHENYLEPHRINE HCL 10 MG/ML IJ SOLN
INTRAMUSCULAR | Status: DC | PRN
  Administered 2014-11-21: 80 ug via INTRAVENOUS
  Administered 2014-11-21: 120 ug via INTRAVENOUS

## 2014-11-21 MED ORDER — PHENYLEPHRINE 40 MCG/ML (10ML) SYRINGE FOR IV PUSH (FOR BLOOD PRESSURE SUPPORT)
PREFILLED_SYRINGE | INTRAVENOUS | Status: AC
  Filled 2014-11-21: qty 10

## 2014-11-21 MED ORDER — MIDAZOLAM HCL 2 MG/2ML IJ SOLN
INTRAMUSCULAR | Status: AC
  Filled 2014-11-21: qty 2

## 2014-11-21 MED ORDER — PENTAFLUOROPROP-TETRAFLUOROETH EX AERO: 1.0000 "application " | INHALATION_SPRAY | CUTANEOUS | Status: DC | PRN

## 2014-11-21 MED ORDER — 0.9 % SODIUM CHLORIDE (POUR BTL) OPTIME
TOPICAL | Status: DC | PRN
  Administered 2014-11-21: 1000 mL

## 2014-11-21 MED ORDER — SODIUM CHLORIDE 0.9 % IR SOLN
Status: DC | PRN
  Administered 2014-11-21: 3000 mL

## 2014-11-21 MED ORDER — MEPERIDINE HCL 25 MG/ML IJ SOLN: 6.2500 mg | INTRAMUSCULAR | Status: DC | PRN

## 2014-11-21 MED ORDER — HYDROMORPHONE HCL 1 MG/ML IJ SOLN: 0.2500 mg | INTRAMUSCULAR | Status: DC | PRN

## 2014-11-21 MED ORDER — FENTANYL CITRATE (PF) 250 MCG/5ML IJ SOLN
INTRAMUSCULAR | Status: AC
  Filled 2014-11-21: qty 5

## 2014-11-21 MED ORDER — VANCOMYCIN HCL IN DEXTROSE 1-5 GM/200ML-% IV SOLN
INTRAVENOUS | Status: AC
  Administered 2014-11-21: 1000 mg via INTRAVENOUS
  Filled 2014-11-21: qty 200

## 2014-11-21 MED ORDER — HEPARIN (PORCINE) IN NACL 100-0.45 UNIT/ML-% IJ SOLN
1100.0000 [IU]/h | INTRAMUSCULAR | Status: DC
  Administered 2014-11-21 – 2014-11-22 (×2): 1200 [IU]/h via INTRAVENOUS
  Administered 2014-11-23 – 2014-11-25 (×2): 1100 [IU]/h via INTRAVENOUS
  Filled 2014-11-21 (×5): qty 250

## 2014-11-21 MED ORDER — LIDOCAINE-PRILOCAINE 2.5-2.5 % EX CREA: 1.0000 "application " | TOPICAL_CREAM | CUTANEOUS | Status: DC | PRN

## 2014-11-21 MED ORDER — LIDOCAINE HCL (CARDIAC) 20 MG/ML IV SOLN
INTRAVENOUS | Status: DC | PRN
  Administered 2014-11-21: 50 mg via INTRAVENOUS

## 2014-11-21 MED ORDER — DOXERCALCIFEROL 4 MCG/2ML IV SOLN
INTRAVENOUS | Status: AC
  Administered 2014-11-21: 4 ug
  Filled 2014-11-21: qty 2

## 2014-11-21 MED ORDER — PROMETHAZINE HCL 25 MG/ML IJ SOLN: 6.2500 mg | INTRAMUSCULAR | Status: DC | PRN

## 2014-11-21 MED ORDER — HEPARIN (PORCINE) IN NACL 100-0.45 UNIT/ML-% IJ SOLN
1200.0000 [IU]/h | INTRAMUSCULAR | Status: DC
  Filled 2014-11-21: qty 250

## 2014-11-21 MED ORDER — PROPOFOL 10 MG/ML IV BOLUS
INTRAVENOUS | Status: DC | PRN
  Administered 2014-11-21: 150 mg via INTRAVENOUS

## 2014-11-21 MED ORDER — FENTANYL CITRATE (PF) 100 MCG/2ML IJ SOLN
INTRAMUSCULAR | Status: DC | PRN
  Administered 2014-11-21: 50 ug via INTRAVENOUS

## 2014-11-21 SURGICAL SUPPLY — 44 items
BANDAGE ELASTIC 4 VELCRO ST LF (GAUZE/BANDAGES/DRESSINGS) IMPLANT
BANDAGE ESMARK 6X9 LF (GAUZE/BANDAGES/DRESSINGS) IMPLANT
BNDG CMPR 9X6 STRL LF SNTH (GAUZE/BANDAGES/DRESSINGS)
BNDG ESMARK 6X9 LF (GAUZE/BANDAGES/DRESSINGS)
CANISTER SUCTION 2500CC (MISCELLANEOUS) ×1 IMPLANT
CANISTER WOUND CARE 500ML ATS (WOUND CARE) ×2 IMPLANT
CUFF TOURNIQUET SINGLE 18IN (TOURNIQUET CUFF) IMPLANT
CUFF TOURNIQUET SINGLE 24IN (TOURNIQUET CUFF) IMPLANT
CUFF TOURNIQUET SINGLE 34IN LL (TOURNIQUET CUFF) IMPLANT
CUFF TOURNIQUET SINGLE 44IN (TOURNIQUET CUFF) IMPLANT
DRAIN CHANNEL 15F RND FF W/TCR (WOUND CARE) IMPLANT
DRAPE CHEST BREAST 15X10 FENES (DRAPES) ×2 IMPLANT
DRAPE PROXIMA HALF (DRAPES) ×2 IMPLANT
DRSG COVADERM 4X10 (GAUZE/BANDAGES/DRESSINGS) IMPLANT
DRSG COVADERM 4X8 (GAUZE/BANDAGES/DRESSINGS) IMPLANT
DRSG VAC ATS MED SENSATRAC (GAUZE/BANDAGES/DRESSINGS) ×2 IMPLANT
ELECT REM PT RETURN 9FT ADLT (ELECTROSURGICAL) ×3
ELECTRODE REM PT RTRN 9FT ADLT (ELECTROSURGICAL) ×1 IMPLANT
EVACUATOR SILICONE 100CC (DRAIN) IMPLANT
GEL ULTRASOUND 20GR AQUASONIC (MISCELLANEOUS) ×2 IMPLANT
GLOVE BIO SURGEON STRL SZ7 (GLOVE) ×3 IMPLANT
GLOVE BIOGEL PI IND STRL 7.5 (GLOVE) ×1 IMPLANT
GLOVE BIOGEL PI INDICATOR 7.5 (GLOVE) ×2
GOWN STRL REUS W/ TWL LRG LVL3 (GOWN DISPOSABLE) ×3 IMPLANT
GOWN STRL REUS W/TWL LRG LVL3 (GOWN DISPOSABLE) ×6
KIT BASIN OR (CUSTOM PROCEDURE TRAY) ×3 IMPLANT
KIT ROOM TURNOVER OR (KITS) ×3 IMPLANT
NS IRRIG 1000ML POUR BTL (IV SOLUTION) ×4 IMPLANT
PACK GENERAL/GYN (CUSTOM PROCEDURE TRAY) ×2 IMPLANT
PACK PERIPHERAL VASCULAR (CUSTOM PROCEDURE TRAY) ×1 IMPLANT
PAD ARMBOARD 7.5X6 YLW CONV (MISCELLANEOUS) ×6 IMPLANT
PADDING CAST COTTON 6X4 STRL (CAST SUPPLIES) IMPLANT
SPONGE SURGIFOAM ABS GEL 100 (HEMOSTASIS) IMPLANT
STAPLER VISISTAT 35W (STAPLE) IMPLANT
SUT MNCRL AB 4-0 PS2 18 (SUTURE) ×1 IMPLANT
SUT PROLENE 5 0 C 1 24 (SUTURE) IMPLANT
SUT PROLENE 6 0 BV (SUTURE) ×3 IMPLANT
SUT VIC AB 2-0 CT1 27 (SUTURE) ×6
SUT VIC AB 2-0 CT1 TAPERPNT 27 (SUTURE) ×1 IMPLANT
SUT VIC AB 3-0 SH 27 (SUTURE)
SUT VIC AB 3-0 SH 27X BRD (SUTURE) ×1 IMPLANT
TRAY FOLEY W/METER SILVER 16FR (SET/KITS/TRAYS/PACK) ×1 IMPLANT
UNDERPAD 30X30 INCONTINENT (UNDERPADS AND DIAPERS) ×3 IMPLANT
WATER STERILE IRR 1000ML POUR (IV SOLUTION) ×1 IMPLANT

## 2014-11-21 NOTE — Anesthesia Procedure Notes (Signed)
Procedure Name: LMA Insertion Date/Time: 11/21/2014 9:41 AM Performed by: Neldon Newport Pre-anesthesia Checklist: Patient being monitored, Suction available, Emergency Drugs available, Patient identified and Timeout performed Patient Re-evaluated:Patient Re-evaluated prior to inductionOxygen Delivery Method: Circle system utilized Preoxygenation: Pre-oxygenation with 100% oxygen Intubation Type: IV induction Ventilation: Mask ventilation without difficulty LMA: LMA inserted LMA Size: 5.0 Placement Confirmation: breath sounds checked- equal and bilateral Tube secured with: Tape Dental Injury: Teeth and Oropharynx as per pre-operative assessment

## 2014-11-21 NOTE — Progress Notes (Signed)
Renal MD paged. Arthor Captain LPN

## 2014-11-21 NOTE — Progress Notes (Signed)
Utilization Review completed. Donnia Poplaski RN BSN CM 

## 2014-11-21 NOTE — Progress Notes (Signed)
Subjective:  No cos, awaiting VVS surgery then HD  Objective Vital signs in last 24 hours: Filed Vitals:   11/20/14 0549 11/20/14 0648 11/20/14 2221 11/21/14 0521  BP: 96/35 130/50 114/44 138/62  Pulse: 82 84 89 95  Temp: 97.8 F (36.6 C)  97.5 F (36.4 C) 97.7 F (36.5 C)  TempSrc: Oral  Oral Oral  Resp: 18  18 18   Height:      Weight:      SpO2: 100%  94% 100%   Weight change:   Physical Exam: General: Alert OX3 pleasant NAD Heart: RRR , no mur , rub or gallop Lungs:CTA  Abdomen: soft NT, ND Extremities: right LE tr edema, left LE1+ edema; old right upper AVF pulsatile, no bruit Dialysis Access: left  IJ perm cath  and right thigh AVGG + bruit, moist  groin wound OP   OP Dialysis Orders:  [facility] MWF EDW 72 - needs lower EDW at d/c (? 71)2K 2 Ca 4 hr 3000 heparin,  left IJ and right fem AVGG inserted 5/26  Hectorol 4 Aranesp 200 q Wed no Fe   Problem/Plan: 1. Anemia - HGB  8.5  Stable this am =Etiologies = Multifactorial. With  ESRD anemia/Graft hematoma/.and  Probable resistance to ESA's d/t chronic inflammation (currently dehisced wound from AVG surgery) ,Had 2 unit 6/22 transfusion. Continue 200/week aranesp (rec'd dose 6/22). Transfuse PRN / HAS  Fe 54 25% sat Ferritin 763 - plan course of IV Fe with HD 2. ESRD - MWF schedule. K 4.5 had  - 4 K bath Friday - plan next HD  Today  - work around surgery - no heparin with hd today  3. Dehisced AVG wound with138/ 62 and variable, post HD BED WT = 71.1 BUT - pt cannot stand 4. Metabolic bone disease - Resumed Hectorol 4/ phos 2.3 - decreased phoslo to bid 5. Heart transplant - po meds Imuran, Rapamune, Prednisone 6. Ho DVT- on chronic coumadin / PCP follows;Pharmacy consult in hosp with coumadin on hold/heparin started Sat. 7. Dm type 2- per primary service 8. Malnutrition - alb 2.3 - renal diet/vits/supplements/nutrition consult 9. Thrombocytopenia - trending up today to [place], PA-C [place] [phone] 11/21/2014,8:18 AM  LOS: 3 days   Pt seen, examined and agree w A/P as above.  Kelly Splinter MD pager [phone]    cell [phone] 11/21/2014, 1:35 PM    Labs: Basic Metabolic Panel:  Recent Labs Lab 11/18/14 0620 11/20/14 0500 11/21/14 0550  NA 135 135 134*  K 3.9 4.3 4.5  CL 100* 100* 104  CO2 26 28 23   GLUCOSE 85 107* 81  BUN 22* 23* 34*  CREATININE 6.30* 5.99* 7.69*  CALCIUM 8.6* 8.7* 9.2  PHOS 3.0 2.3*  --    Liver Function Tests:  Recent Labs Lab 11/18/14 0620 11/20/14 0500  ALBUMIN 2.4* 2.3*   No results for input(s): LIPASE, AMYLASE in the last 168 hours. No results for input(s): AMMONIA in the last 168 hours. CBC:  Recent Labs Lab 11/16/14 1157  11/17/14 0526 11/18/14 0620 11/20/14 0648 11/21/14 0550  WBC 4.0  --  3.7* 4.4 3.3* 4.0  NEUTROABS 1.7  --   --   --   --   --   HGB 6.5*  < > 8.5* 8.9* 8.5* 8.5*  HCT 21.1*  < > 26.3* 28.4* 27.1* 27.0*  MCV 94.6  --  92.6 95.0 95.1 97.5  PLT 104*  --  81*  77* 76* 91*  < > = values in this interval not displayed. Cardiac Enzymes: No results for input(s): CKTOTAL, CKMB, CKMBINDEX, TROPONINI in the last 168 hours. CBG:  Recent Labs Lab 11/20/14 0750 11/20/14 1248 11/20/14 1638 11/20/14 2114 11/21/14 0757  GLUCAP 113* 96 82 84 96    Studies/Results: No results found. Medications:   . sodium chloride   Intravenous Once  . atorvastatin  40 mg Oral Daily  . azaTHIOprine  75 mg Oral q morning - 10a  . calcium acetate  667 mg Oral TID WC  . carvedilol  6.25 mg Oral QHS  . colesevelam  3,750 mg Oral Daily  . collagenase   Topical Daily  . [START ON 11/23/2014] darbepoetin (ARANESP) injection - DIALYSIS  200 mcg Intravenous Q Wed-HD  . doxercalciferol  4 mcg Intravenous Q M,W,F-HD  . famotidine  10 mg Oral QHS  . feeding supplement (PRO-STAT SUGAR FREE 64)  30 mL Oral BID  . feeding supplement (RESOURCE BREEZE)  1 Container Oral BID BM  . ferric gluconate  (FERRLECIT/NULECIT) IV  125 mg Intravenous Q M,W,F-HD  . insulin aspart  0-5 Units Subcutaneous QHS  . insulin aspart  0-9 Units Subcutaneous TID WC  . insulin aspart  3 Units Subcutaneous TID WC  . insulin glargine  20 Units Subcutaneous q morning - 10a  . isosorbide dinitrate  40 mg Oral 3 times per day  . multivitamin  1 tablet Oral QHS  . predniSONE  5 mg Oral Q breakfast  . sirolimus  1.5 mg Oral Daily  . vancomycin  750 mg Intravenous Q M,W,F-HD

## 2014-11-21 NOTE — Anesthesia Preprocedure Evaluation (Addendum)
Anesthesia Evaluation  Patient identified by MRN, date of birth, ID band Patient awake    Reviewed: Allergy & Precautions, NPO status , Patient's Chart, lab work & pertinent test results, reviewed documented beta blocker date and time   Airway Mallampati: II  TM Distance: >3 FB Neck ROM: Full    Dental  (+) Teeth Intact, Dental Advidsory Given   Pulmonary pneumonia -, former smoker,  breath sounds clear to auscultation        Cardiovascular hypertension, Pt. on medications and Pt. on home beta blockers + angina + CAD, + Past MI, + Peripheral Vascular Disease and +CHF + dysrhythmias Rhythm:Regular     Neuro/Psych negative neurological ROS  negative psych ROS   GI/Hepatic negative GI ROS, Neg liver ROS,   Endo/Other  negative endocrine ROSdiabetes, Type 2, Oral Hypoglycemic Agents  Renal/GU ESRFRenal diseasenegative Renal ROS  negative genitourinary   Musculoskeletal negative musculoskeletal ROS (+)   Abdominal   Peds  Hematology negative hematology ROS (+) anemia ,   Anesthesia Other Findings   Reproductive/Obstetrics                            Anesthesia Physical  Anesthesia Plan  ASA: III  Anesthesia Plan: General   Post-op Pain Management:    Induction: Intravenous  Airway Management Planned: LMA  Additional Equipment: None  Intra-op Plan:   Post-operative Plan: Extubation in OR  Informed Consent: I have reviewed the patients History and Physical, chart, labs and discussed the procedure including the risks, benefits and alternatives for the proposed anesthesia with the patient or authorized representative who has indicated his/her understanding and acceptance.   Dental advisory given and Dental Advisory Given  Plan Discussed with: CRNA  Anesthesia Plan Comments:       Anesthesia Quick Evaluation                                  Anesthesia Evaluation  Patient identified  by MRN, date of birth, ID band Patient awake    Reviewed: Allergy & Precautions, H&P , NPO status , Patient's Chart, lab work & pertinent test results  Airway Mallampati: II TM Distance: >3 FB Neck ROM: Full    Dental  (+) Caps, Dental Advisory Given   Pulmonary pneumonia -, former smoker,  breath sounds clear to auscultation        Cardiovascular hypertension, Pt. on home beta blockers and Pt. on medications + angina + CAD, + Past MI, + Cardiac Stents, + Peripheral Vascular Disease and +CHF + dysrhythmias Ventricular Fibrillation Rhythm:Regular Rate:Normal  Cardiac transplant   Neuro/Psych negative neurological ROS  negative psych ROS   GI/Hepatic negative GI ROS, Neg liver ROS,   Endo/Other  diabetes, Type 1, Insulin Dependent  Renal/GU Dialysis and CRFRenal disease  negative genitourinary   Musculoskeletal negative musculoskeletal ROS (+)   Abdominal   Peds negative pediatric ROS (+)  Hematology negative hematology ROS (+) anemia ,   Anesthesia Other Findings   Reproductive/Obstetrics                         Anesthesia Physical Anesthesia Plan  ASA: IV  Anesthesia Plan: MAC   Post-op Pain Management:    Induction: Intravenous  Airway Management Planned: Simple Face Mask  Additional Equipment:   Intra-op Plan:   Post-operative Plan: Extubation in OR  Informed Consent:  I have reviewed the patients History and Physical, chart, labs and discussed the procedure including the risks, benefits and alternatives for the proposed anesthesia with the patient or authorized representative who has indicated his/her understanding and acceptance.   Dental advisory given  Plan Discussed with: CRNA  Anesthesia Plan Comments:         Anesthesia Quick Evaluation

## 2014-11-21 NOTE — Op Note (Signed)
    OPERATIVE NOTE   PROCEDURE: 1. Right groin hematoma evacuation 2. Placement of negative pressure dressing  PRE-OPERATIVE DIAGNOSIS: s/p R thigh arteriovenous graft complicated with hematoma  POST-OPERATIVE DIAGNOSIS: same as above   SURGEON: Adele Barthel, MD  ANESTHESIA: general  ESTIMATED BLOOD LOSS: 50 cc  FINDING(S): 1.  Liquidifed hematoma in cavity overlying graft 2.  No evidence of graft infection 3.  Dopplerable signals consistent with patent arteriovenous graft   SPECIMEN(S):  Anaerobic & aerobic cultures of hematoma cavity  INDICATIONS:   Brandon Sharp is a 68 y.o. male who presents with right groin hematoma after right thigh arteriovenous graft placement.  Pt was restarted on anticoagulation post-op by his primary physician.   He presented to the hospital with superficial right groin incision dehiscence and palpable hematoma overlying graft.  I felt the hematoma would be a potential source of infection of the graft in this immunosuppressed patient so I felt evacuation was going to be necessary.  We also discussed use of a wet-to-dry dressing to facility wound healing.  The patient aware the risks of this procedure include but are not limited to: bleeding, infection, need for additional procedures, and possible thrombosis of right thigh arteriovenous graft.  DESCRIPTION: After obtaining full informed written consent, the patient was brought back to the operating room and placed supine upon the operating table.  The patient received IV antibiotics prior to induction.  After obtaining adequate anesthesia, the patient was prepped and draped in the standard fashion for: right thigh arteriovenous graft procedure.  I bluntly got into the hematoma cavity from the open incision.  I evacuated ~100 cc of liquefied hematoma from the hematoma cavity.  I washed out the right groin surgical site with 3 L of sterile normal saline with a Pulsavac.  I could see only 1-2 cm of the arterial  and venous limbs of the graft, with the majority of both grafts already incorporated into the subcutaneous tissue.  I saw no obvious evidence of infection but took anaerobic and aerobic cultures of the hematoma cavity.  I controlled bleeding with electrocautery.  I then reapproximated the deep subcutaneous tissue with interrupted 2-0 Vicryl stitches.  I then fashioned a VAC sponge for this incision and then affixed the sponge with adhesive strips.  I cut a hole in the center of this sponge and affixed the lilypad.  The VAC tubing was connected to the VAC pump and set to 125 continuous.  The VAC sponge became adherent.  COMPLICATIONS: none  CONDITION: stable   Adele Barthel, MD [facility] [place] Office: [phone] Pager: [phone]  11/21/2014, 10:24 AM

## 2014-11-21 NOTE — Transfer of Care (Signed)
Immediate Anesthesia Transfer of Care Note  Patient: Brandon Sharp  Procedure(s) Performed: Procedure(s): EVACUATION HEMATOMA Right Groin (Right) APPLICATION OF WOUND VAC Right Groin (Right)  Patient Location: PACU  Anesthesia Type:General  Level of Consciousness: awake, alert  and oriented  Airway & Oxygen Therapy: Patient Spontanous Breathing and Patient connected to nasal cannula oxygen  Post-op Assessment: Report given to RN, Post -op Vital signs reviewed and stable and Patient moving all extremities X 4  Post vital signs: Reviewed and stable  Last Vitals:  Filed Vitals:   11/21/14 0521  BP: 138/62  Pulse: 95  Temp: 36.5 C  Resp: 18    Complications: No apparent anesthesia complications

## 2014-11-21 NOTE — Progress Notes (Signed)
[facility] PROGRESS NOTE  Brandon Sharp TSV:[id number] DOB: 08/28/1945 DOA: 11/16/2014 PCP: [name], MD  69 year old man with significant past medical history for end-stage renal disease on hemodialysis Monday Wednesday Friday, status post DVT on Coumadin, hypertension, diabetes who presents today with severe weakness and found to have a hemoglobin 6.5. On 6/8 he was found to have a hemoglobin of 7.4 and was given 1 unit of PRBCs. His recent history is significant for a right thigh dialysis graft done in March of this year by Dr. Bridgett Larsson.He has already been seen by nephrology and 2 units of PRBCs have been ordered. Right groin ultrasound is done in the ED and shows a large hypoechoic structure consistent with a hematoma.  Plan for surgery on 6/27  Assessment/Plan: Symptomatic anemia -Status post 2 units of PRBCs -Patient has not been having melanotic or bright blood per rectum. FOBT 2 days prior to admission was negative. -Most likely source of patient's anemia is blood loss from growing hematoma due to dehiscence of his AVG. -Dr. Bridgett Larsson has been consulted and planning to take patient for surgery on 6/27 -Will follow hemoglobin trend -IV iron and Epogen therapy as per renal service discretion  DVT -Has been on Coumadin since March 2016. -For now will hold Coumadin pending Dr. Lianne Moris assessment of his right thigh graft -heparin gtt for now  Heart transplant -Continue immunosuppressive medications.  End-stage renal disease on hemodialysis -Appreciate nephrology following. -will follow rec's and continue HD M-W-F  Hypertension -Stable and well control will continue home antihypertensive regimen -Patient on HD M-W-F which is also helping volume control  Type 2 diabetes -continue Sliding-scale insulin. -A1c 5.6  DVT prophylaxis -SCDs, currently coagulopathic -heparin gtt  Code Status: Full code Family Communication: no family at bedside Disposition Plan: surgery on  Monday   Consultants:  Renal service  Vascular surgery  Procedures: 1. Right groin hematoma evacuation 2. Placement of negative pressure dressing   Antibiotics:  Vancomycin 6/22  HPI/Subjective: S/p surgery  In dialysis  Objective: Filed Vitals:   11/21/14 1258  BP: 114/53  Pulse: 75  Temp:   Resp:     Intake/Output Summary (Last 24 hours) at 11/21/14 1311 Last data filed at 11/21/14 1155  Gross per 24 hour  Intake   1397 ml  Output      0 ml  Net   1397 ml   Filed Weights   11/18/14 0753 11/18/14 1310 11/21/14 1215  Weight: 77.1 kg (169 lb 15.6 oz) 71.1 kg (156 lb 12 oz) 74.5 kg (164 lb 3.9 oz)    Exam:  General:  In  Dialysis   Data Reviewed: Basic Metabolic Panel:  Recent Labs Lab 11/16/14 1226 11/17/14 0526 11/18/14 0620 11/20/14 0500 11/21/14 0550  NA 140 136 135 135 134*  K 3.2* 3.8 3.9 4.3 4.5  CL 104 101 100* 100* 104  CO2 26 29 26 28 23   GLUCOSE 67 80 85 107* 81  BUN 27* 9 22* 23* 34*  CREATININE 7.39* 3.96* 6.30* 5.99* 7.69*  CALCIUM 8.6* 8.2* 8.6* 8.7* 9.2  PHOS  --   --  3.0 2.3*  --    Liver Function Tests:  Recent Labs Lab 11/18/14 0620 11/20/14 0500  ALBUMIN 2.4* 2.3*   CBC:  Recent Labs Lab 11/16/14 1157 11/16/14 1205 11/17/14 0526 11/18/14 0620 11/20/14 0648 11/21/14 0550  WBC 4.0  --  3.7* 4.4 3.3* 4.0  NEUTROABS 1.7  --   --   --   --   --  HGB 6.5* 7.1* 8.5* 8.9* 8.5* 8.5*  HCT 21.1* 21.0* 26.3* 28.4* 27.1* 27.0*  MCV 94.6  --  92.6 95.0 95.1 97.5  PLT 104*  --  81* 77* 76* 91*   ProBNP (last 3 results)  Recent Labs  03/27/14 1747 04/12/14 0105  PROBNP 37853.0* 45045.0*   CBG:  Recent Labs Lab 11/20/14 1248 11/20/14 1638 11/20/14 2114 11/21/14 0757 11/21/14 1047  GLUCAP 96 82 84 96 94   Studies: No results found.  Scheduled Meds: . atorvastatin  40 mg Oral Daily  . azaTHIOprine  75 mg Oral q morning - 10a  . calcium acetate  667 mg Oral TID WC  . carvedilol  6.25 mg Oral QHS  .  colesevelam  3,750 mg Oral Daily  . collagenase   Topical Daily  . [START ON 11/23/2014] darbepoetin (ARANESP) injection - DIALYSIS  200 mcg Intravenous Q Wed-HD  . doxercalciferol  4 mcg Intravenous Q M,W,F-HD  . famotidine  10 mg Oral QHS  . feeding supplement (PRO-STAT SUGAR FREE 64)  30 mL Oral BID  . feeding supplement (RESOURCE BREEZE)  1 Container Oral BID BM  . ferric gluconate (FERRLECIT/NULECIT) IV  125 mg Intravenous Q M,W,F-HD  . insulin aspart  0-5 Units Subcutaneous QHS  . insulin aspart  0-9 Units Subcutaneous TID WC  . insulin aspart  3 Units Subcutaneous TID WC  . insulin glargine  20 Units Subcutaneous q morning - 10a  . isosorbide dinitrate  40 mg Oral 3 times per day  . multivitamin  1 tablet Oral QHS  . predniSONE  5 mg Oral Q breakfast  . sirolimus  1.5 mg Oral Daily  . vancomycin  750 mg Intravenous Q M,W,F-HD   Continuous Infusions: . sodium chloride 10 mL/hr at 11/21/14 2542    Principal Problem:   Symptomatic anemia Active Problems:   DVT (deep venous thrombosis)   Coagulopathy   Heart transplanted   HTN (hypertension), malignant   DM (diabetes mellitus), type 2 with renal complications   ESRD (end stage renal disease) on dialysis    Time spent: 15 minutes   Maheen Cwikla  [facility] Pager [phone]. If 7PM-7AM, please contact night-coverage at [email or web address], password [facility] 11/21/2014, 1:11 PM  LOS: 3 days

## 2014-11-21 NOTE — Progress Notes (Signed)
ANTIBIOTIC/ANTICOAGULATION [place] for vancomycin, heparin Indication: open wound, h/o DVT  Allergies  Allergen Reactions  . Lisinopril Swelling    Lips and tongue swell  . Niacin And Related Other (See Comments)    unknown  . Norvasc [Amlodipine Besylate] Rash    Flushing  . Penicillins Rash    Patient Measurements: Height: 6' (182.9 cm) Weight: 164 lb 3.9 oz (74.5 kg) IBW/kg (Calculated) : 77.6  Vital Signs: Temp: 96.9 F (36.1 C) (06/27 1215) Temp Source: Oral (06/27 1215) BP: 108/51 mmHg (06/27 1430) Pulse Rate: 82 (06/27 1430) Intake/Output from previous day: 06/26 0701 - 06/27 0700 In: 967 [P.O.:600; I.V.:367] Out: -  Intake/Output from this shift: Total I/O In: 670 [I.V.:670] Out: -   Labs:  Recent Labs  11/20/14 0500 11/20/14 0648 11/21/14 0550  WBC  --  3.3* 4.0  HGB  --  8.5* 8.5*  PLT  --  76* 91*  CREATININE 5.99*  --  7.69*   Estimated Creatinine Clearance: 9.6 mL/min (by C-G formula based on Cr of 7.69).  Medical History: Past Medical History  Diagnosis Date  . Diabetes mellitus   . Hypertension   . Myocardial infarction 1985; 1990  . Angina   . Dysrhythmia   . DVT (deep venous thrombosis) ~ 12/2010    LLE  . Anemia   . Blood transfusion 06/1999    post heart transplant  . Peripheral vascular disease   . Renal failure     Hemodialysis MWF last 2 years, sse dr [name] nephrology, goes to [place] [facility]  . DVT (deep venous thrombosis) 08/25/2014    Nearly occluded R IJ, and L subclavian DVT  . CHF (congestive heart failure) 2001    beffore transplant; had AICD that was explanted 07/19/99 following successful heart transplant  . Coronary artery disease     transplant cardiologist Dr. Corky Mull [facility])  . Pneumonia     Medications:  Prescriptions prior to admission  Medication Sig Dispense Refill Last Dose  . atorvastatin (LIPITOR) 40 MG tablet Take 40 mg by mouth daily.     11/15/2014 at  Unknown time  . azaTHIOprine (IMURAN) 50 MG tablet Take 75 mg by mouth every morning.    11/16/2014 at Unknown time  . b complex-vitamin c-folic acid (NEPHRO-VITE) 0.8 MG TABS tablet Take 1 tablet by mouth at bedtime.   11/16/2014 at Unknown time  . calcium acetate (PHOSLO) 667 MG capsule Take 667 mg by mouth 3 (three) times daily with meals.    11/16/2014 at Unknown time  . carvedilol (COREG) 6.25 MG tablet Take 1 tablet (6.25 mg total) by mouth at bedtime. 30 tablet 0 11/15/2014 at 2000  . colesevelam (WELCHOL) 625 MG tablet Take 3,750 mg by mouth daily. Takes 6 tabs daily   11/15/2014 at Unknown time  . famotidine (PEPCID AC) 10 MG chewable tablet Chew 10 mg by mouth at bedtime.    11/15/2014 at Unknown time  . HUMALOG KWIKPEN 100 UNIT/ML SOPN Inject 4-10 Units into the skin 2 (two) times daily. Per sliding scale   11/16/2014 at Unknown time  . insulin glargine (LANTUS) 100 UNIT/ML injection Inject 20 Units into the skin every morning.   11/16/2014 at Unknown time  . isosorbide dinitrate (ISOCHRON) 40 MG CR tablet Take 40 mg by mouth every 8 (eight) hours.  11 11/16/2014 at Unknown time  . predniSONE (DELTASONE) 5 MG tablet Take 1 tablet (5 mg total) by mouth daily with breakfast.  30 tablet 0 11/16/2014 at Unknown time  . Sirolimus 0.5 MG TABS Take 3 tablets by mouth daily.  11 11/16/2014 at Unknown time  . warfarin (COUMADIN) 5 MG tablet Take 1 tablet (5 mg total) by mouth daily. 30 tablet 0 11/15/2014 at Unknown time   Scheduled:  . atorvastatin  40 mg Oral Daily  . azaTHIOprine  75 mg Oral q morning - 10a  . calcium acetate  667 mg Oral TID WC  . carvedilol  6.25 mg Oral QHS  . colesevelam  3,750 mg Oral Daily  . collagenase   Topical Daily  . [START ON 11/23/2014] darbepoetin (ARANESP) injection - DIALYSIS  200 mcg Intravenous Q Wed-HD  . doxercalciferol  4 mcg Intravenous Q M,W,F-HD  . famotidine  10 mg Oral QHS  . feeding supplement (PRO-STAT SUGAR FREE 64)  30 mL Oral BID  . feeding supplement  (RESOURCE BREEZE)  1 Container Oral BID BM  . ferric gluconate (FERRLECIT/NULECIT) IV  125 mg Intravenous Q M,W,F-HD  . insulin aspart  0-5 Units Subcutaneous QHS  . insulin aspart  0-9 Units Subcutaneous TID WC  . insulin aspart  3 Units Subcutaneous TID WC  . insulin glargine  20 Units Subcutaneous q morning - 10a  . isosorbide dinitrate  40 mg Oral 3 times per day  . multivitamin  1 tablet Oral QHS  . predniSONE  5 mg Oral Q breakfast  . sirolimus  1.5 mg Oral Daily  . vancomycin  750 mg Intravenous Q M,W,F-HD   Infusions:  . sodium chloride 10 mL/hr at 11/21/14 0931  . heparin      Assessment: 69 yo male s/p thigh AVG complicated w/ hematoma, now w/ superficial skin separation 2/2 maceration right groin and poor wound healing, to begin empiric ABX for loss of barrier function.  POD #0 R groin hematoma evacuation and placement of negative pressure dressing.   Goal of Therapy:  Heparin level 0.3-0.7 units/ml  Plan:  Restart heparin at 1200 units/hr at 2200 Daily HL, CBC Monitor for s/sx bleeding    Hughes Better, PharmD, BCPS Clinical Pharmacist Pager: [phone] 11/21/2014 3:00 PM

## 2014-11-21 NOTE — H&P (Signed)
Brief History and Physical  History of Present Illness  Brandon Sharp is a 69 y.o. male who presents with chief complaint: s/p R thigh AVG.  This patient was restarted on Lovenox bridge to coumadin post-op per his PCP.  Unfortunately, he developed a right thigh hematoma post-op.  The skin at the femoral exposure dehisced and pt was started on IV abx.  The patient presents today for evacuation of right thigh hematoma.    Past Medical History  Diagnosis Date  . Diabetes mellitus   . Hypertension   . Myocardial infarction 1985; 1990  . Angina   . Dysrhythmia   . DVT (deep venous thrombosis) ~ 12/2010    LLE  . Anemia   . Blood transfusion 06/1999    post heart transplant  . Peripheral vascular disease   . Renal failure     Hemodialysis MWF last 2 years, sse dr [name] nephrology, goes to [place] [facility]  . DVT (deep venous thrombosis) 08/25/2014    Nearly occluded R IJ, and L subclavian DVT  . CHF (congestive heart failure) 2001    beffore transplant; had AICD that was explanted 07/19/99 following successful heart transplant  . Coronary artery disease     transplant cardiologist Dr. Corky Mull [facility])  . Pneumonia     Past Surgical History  Procedure Laterality Date  . Nephrectomy transplanted organ  2008  . Av fistula repair      rt. a=fore arm fistula  . Av fistula placement  ~ 01/2010    "this was my 2nd fistula placement"  . Heart transplant  06/1999  . Colonoscopy  03/17/2012    Procedure: COLONOSCOPY;  Surgeon: Lear Ng, MD;  Location: [facility];  Service: Endoscopy;  Laterality: N/A;  . Coronary artery bypass graft  1990    CABG X3  . Colonoscopy with propofol N/A 12/14/2013    Procedure: COLONOSCOPY WITH PROPOFOL;  Surgeon: Lear Ng, MD;  Location: [facility];  Service: Endoscopy;  Laterality: N/A;  . Video bronchoscopy Bilateral 04/14/2014    Procedure: VIDEO BRONCHOSCOPY WITH FLUORO;  Surgeon: Collene Gobble, MD;   Location: [place];  Service: Cardiopulmonary;  Laterality: Bilateral;  . Shuntogram Right 05/09/2014    Procedure: FISTULOGRAM;  Surgeon: Conrad Dresden, MD;  Location: [facility] CATH LAB;  Service: Cardiovascular;  Laterality: Right;  . Coronary angioplasty with stent placement  1985; 1990    prior stents in the 80's and 90's then CABG; s/p heart transplant 2001 s/p BMS mid LAD and proximal RCA 04/21/13 [facility])  . Av fistula placement Right 10/20/2014    Procedure: INSERTION OF ARTERIOVENOUS (AV) GORE-TEX GRAFT THIGH;  Surgeon: Conrad Arcola, MD;  Location: [place];  Service: Vascular;  Laterality: Right;    History   Social History  . Marital Status: Married    Spouse Name: N/A  . Number of Children: N/A  . Years of Education: N/A   Occupational History  . Not on file.   Social History Main Topics  . Smoking status: Former Smoker -- 1.00 packs/day for 20 years    Types: Cigarettes    Quit date: 05/27/1988  . Smokeless tobacco: Never Used  . Alcohol Use: Yes     Comment: "Holidays"  . Drug Use: No  . Sexual Activity: Not on file   Other Topics Concern  . Not on file   Social History Narrative    Family History  Problem Relation Age of Onset  .  Heart disease Mother   . Hyperlipidemia Mother   . Hypertension Mother   . Diabetes Mother   . Hyperlipidemia Father   . Hypertension Father   . Heart disease Father   . Diabetes Brother   . Heart disease Brother     Heart Disease before age 31  . Hyperlipidemia Brother   . Hypertension Brother   . Heart attack Brother     No current facility-administered medications on file prior to encounter.   Current Outpatient Prescriptions on File Prior to Encounter  Medication Sig Dispense Refill  . atorvastatin (LIPITOR) 40 MG tablet Take 40 mg by mouth daily.      Marland Kitchen azaTHIOprine (IMURAN) 50 MG tablet Take 75 mg by mouth every morning.     Marland Kitchen b complex-vitamin c-folic acid (NEPHRO-VITE) 0.8 MG TABS tablet Take 1 tablet by mouth at  bedtime.    . calcium acetate (PHOSLO) 667 MG capsule Take 667 mg by mouth 3 (three) times daily with meals.     . carvedilol (COREG) 6.25 MG tablet Take 1 tablet (6.25 mg total) by mouth at bedtime. 30 tablet 0  . colesevelam (WELCHOL) 625 MG tablet Take 3,750 mg by mouth daily. Takes 6 tabs daily    . famotidine (PEPCID AC) 10 MG chewable tablet Chew 10 mg by mouth at bedtime.     Marland Kitchen HUMALOG KWIKPEN 100 UNIT/ML SOPN Inject 4-10 Units into the skin 2 (two) times daily. Per sliding scale    . insulin glargine (LANTUS) 100 UNIT/ML injection Inject 20 Units into the skin every morning.    . isosorbide dinitrate (ISOCHRON) 40 MG CR tablet Take 40 mg by mouth every 8 (eight) hours.  11  . predniSONE (DELTASONE) 5 MG tablet Take 1 tablet (5 mg total) by mouth daily with breakfast. 30 tablet 0  . Sirolimus 0.5 MG TABS Take 3 tablets by mouth daily.  11  . warfarin (COUMADIN) 5 MG tablet Take 1 tablet (5 mg total) by mouth daily. 30 tablet 0  . [DISCONTINUED] doxazosin (CARDURA) 8 MG tablet Take 8 mg by mouth 2 (two) times daily.      . [DISCONTINUED] hydrALAZINE (APRESOLINE) 25 MG tablet Take 75 mg by mouth 3 (three) times daily.        Allergies  Allergen Reactions  . Lisinopril Swelling    Lips and tongue swell  . Niacin And Related Other (See Comments)    unknown  . Norvasc [Amlodipine Besylate] Rash    Flushing  . Penicillins Rash    Review of Systems: As listed above, otherwise negative.  Physical Examination  Filed Vitals:   11/20/14 0549 11/20/14 0648 11/20/14 2221 11/21/14 0521  BP: 96/35 130/50 114/44 138/62  Pulse: 82 84 89 95  Temp: 97.8 F (36.6 C)  97.5 F (36.4 C) 97.7 F (36.5 C)  TempSrc: Oral  Oral Oral  Resp: 18  18 18   Height:      Weight:      SpO2: 100%  94% 100%    General: A&O x 3, WDWN  Pulmonary: Sym exp, good air movt, CTAB, no rales, rhonchi, & wheezing  Cardiac: RRR, Nl S1, S2, no Murmurs, rubs or gallops  Gastrointestinal: soft, NTND, -G/R, -  HSM, - masses, - CVAT B  Musculoskeletal: M/S 5/5 throughout , Extremities without ischemic changes , R thigh hematoma, R groin incision open superficially, +weak thrill, +bruit  Laboratory See [place]  Brandon Sharp is a 69 y.o. male  who presents with: s/p R thigh AVG complicated with hematoma, s/p heart transplantation, DVT   The patient is scheduled for: evacuation of right thigh hematoma, VAC placement.  We also briefly talked about the possibility that removal of the thigh AVG might be needed if it appeared infected in the field.  The patient is aware of the risks and agrees to proceed.  Adele Barthel, MD [facility] [place] Office: [phone] Pager: [phone]  11/21/2014, 9:24 AM

## 2014-11-21 NOTE — Clinical Documentation Improvement (Signed)
  11/18/14 Nephrology progress notes states, "Malnutrition-alb 2.4-renal diet/vits/supplements/nutrition consult. 11/19/14 Nephrology progress notes states, "Malnutrition-alb 2.4-renal diet/vits/supplements/nutrition consult. 11/20/14 Nephrology progress notes states, "Malnutrition-alb 2.3-renal diet/vits/supplements/nutrition consult. 11/21/14 Nephrology progress notes states, "Malnutrition-alb 2.4-renal diet/vits/supplements/nutrition consult.  11/18/14 Registered Dietician Consult:  "Nutrition-Focused physical exam completed. Findings are no fat depletion, moderate to severe muscle depletion, and moderate edema." BMI= 23.05  Please clarify the specific medical condition related to the clinical findings if any:  Malnutrition-Mild Malnutrition-Moderate Malnutrition- Severe Other explanation for clinical findings Unable to suspect or determine  Thank you,  Pryor Montes, BSN, RN [facility] HIM/Clinical Documentation Specialist [email or web address] [phone]

## 2014-11-21 NOTE — Progress Notes (Signed)
Called for family to come back to visit since we are going straight to dialysis.

## 2014-11-21 NOTE — Progress Notes (Signed)
IVF infusing through Hemodialysis cath is left chest upon arrival to pacu

## 2014-11-21 NOTE — Anesthesia Postprocedure Evaluation (Signed)
Anesthesia Post Note  Patient: Brandon Sharp  Procedure(s) Performed: Procedure(s) (LRB): EVACUATION HEMATOMA Right Groin (Right) APPLICATION OF WOUND VAC Right Groin (Right)  Anesthesia type: General  Patient location: PACU  Post pain: Pain level controlled  Post assessment: Post-op Vital signs reviewed  Last Vitals: BP 114/53 mmHg  Pulse 75  Temp(Src) 36.1 C (Oral)  Resp 11  Ht 6' (1.829 m)  Wt 164 lb 3.9 oz (74.5 kg)  BMI 22.27 kg/m2  SpO2 100%  Post vital signs: Reviewed  Level of consciousness: sedated  Complications: No apparent anesthesia complications

## 2014-11-22 ENCOUNTER — Inpatient Hospital Stay (HOSPITAL_COMMUNITY): Payer: Medicare Other

## 2014-11-22 ENCOUNTER — Encounter (HOSPITAL_COMMUNITY): Payer: Self-pay | Admitting: Vascular Surgery

## 2014-11-22 DIAGNOSIS — R52 Pain, unspecified: Secondary | ICD-10-CM

## 2014-11-22 LAB — HEPARIN LEVEL (UNFRACTIONATED): Heparin Unfractionated: 0.41 IU/mL (ref 0.30–0.70)

## 2014-11-22 LAB — CBC
HEMATOCRIT: 28.3 % — AB (ref 39.0–52.0)
HEMOGLOBIN: 8.7 g/dL — AB (ref 13.0–17.0)
MCH: 29.4 pg (ref 26.0–34.0)
MCHC: 30.7 g/dL (ref 30.0–36.0)
MCV: 95.6 fL (ref 78.0–100.0)
Platelets: 84 10*3/uL — ABNORMAL LOW (ref 150–400)
RBC: 2.96 MIL/uL — ABNORMAL LOW (ref 4.22–5.81)
RDW: 18.1 % — ABNORMAL HIGH (ref 11.5–15.5)
WBC: 3.7 10*3/uL — ABNORMAL LOW (ref 4.0–10.5)

## 2014-11-22 LAB — GLUCOSE, CAPILLARY
GLUCOSE-CAPILLARY: 84 mg/dL (ref 65–99)
GLUCOSE-CAPILLARY: 61 mg/dL — AB (ref 65–99)
GLUCOSE-CAPILLARY: 102 mg/dL — AB (ref 65–99)
Glucose-Capillary: 188 mg/dL — ABNORMAL HIGH (ref 65–99)
Glucose-Capillary: 89 mg/dL (ref 65–99)
Glucose-Capillary: 70 mg/dL (ref 65–99)

## 2014-11-22 LAB — PROTIME-INR
INR: 1.66 — ABNORMAL HIGH (ref 0.00–1.49)
Prothrombin Time: 19.7 seconds — ABNORMAL HIGH (ref 11.6–15.2)

## 2014-11-22 MED ORDER — CHLORHEXIDINE GLUCONATE CLOTH 2 % EX PADS
6.0000 | MEDICATED_PAD | Freq: Every day | CUTANEOUS | Status: AC
  Administered 2014-11-22 – 2014-11-26 (×5): 6 via TOPICAL

## 2014-11-22 MED ORDER — WARFARIN - PHARMACIST DOSING INPATIENT: Freq: Every day | Status: DC

## 2014-11-22 MED ORDER — INSULIN GLARGINE 100 UNIT/ML ~~LOC~~ SOLN
15.0000 [IU] | Freq: Every morning | SUBCUTANEOUS | Status: DC
  Filled 2014-11-22: qty 0.15

## 2014-11-22 MED ORDER — MUPIROCIN 2 % EX OINT
1.0000 "application " | TOPICAL_OINTMENT | Freq: Two times a day (BID) | CUTANEOUS | Status: AC
  Administered 2014-11-22 – 2014-11-26 (×9): 1 via NASAL
  Filled 2014-11-22 (×2): qty 22

## 2014-11-22 MED ORDER — WARFARIN SODIUM 5 MG PO TABS
5.0000 mg | ORAL_TABLET | Freq: Once | ORAL | Status: AC
  Administered 2014-11-22: 5 mg via ORAL
  Filled 2014-11-22: qty 1

## 2014-11-22 NOTE — Progress Notes (Signed)
   Daily Progress Note  Assessment/Planning: POD #1 s/p R groin hematoma evacuation, VAC placement   Cont VAC  Finish abx course  Await wound c/s  Reload Coumadin on Heparin drip.  Hematoma developed on Lovenox bridge (not recommended for ESRD pt)  Will periodically check on patient  Subjective  - 1 Day Post-Op  No complaints  Objective Filed Vitals:   11/21/14 1630 11/21/14 1701 11/21/14 2142 11/22/14 0556  BP: 100/68 104/55 110/44 118/51  Pulse: 74 88 95 93  Temp: 96.4 F (35.8 C) 97.6 F (36.4 C) 98.3 F (36.8 C) 98.3 F (36.8 C)  TempSrc: Oral Oral Oral Oral  Resp: 16 18 18 18   Height:      Weight: 159 lb 13.3 oz (72.5 kg)     SpO2: 100% 100% 100% 96%    Intake/Output Summary (Last 24 hours) at 11/22/14 0728 Last data filed at 11/22/14 0600  Gross per 24 hour  Intake    670 ml  Output   1975 ml  Net  -1305 ml    VASC  R groin VAC in adherent, 75 cc drainage  Laboratory CBC    Component Value Date/Time   WBC 4.0 11/21/2014 0550   WBC 4.5 02/13/2012 1314   HGB 8.5* 11/21/2014 0550   HGB 11.6* 02/13/2012 1314   HCT 27.0* 11/21/2014 0550   HCT 36.1* 02/13/2012 1314   PLT 91* 11/21/2014 0550   PLT 76* 02/13/2012 1314    BMET    Component Value Date/Time   NA 134* 11/21/2014 0550   K 4.5 11/21/2014 0550   CL 104 11/21/2014 0550   CO2 23 11/21/2014 0550   GLUCOSE 81 11/21/2014 0550   BUN 34* 11/21/2014 0550   CREATININE 7.69* 11/21/2014 0550   CALCIUM 9.2 11/21/2014 0550   GFRNONAA 6* 11/21/2014 0550   GFRAA 7* 11/21/2014 0550    Adele Barthel, MD [facility] [place] Office: [phone] Pager: [phone]  11/22/2014, 7:28 AM

## 2014-11-22 NOTE — Progress Notes (Addendum)
[facility] PROGRESS NOTE  Brandon Sharp WEX:[id number] DOB: [birth year] DOA: 11/16/2014 PCP: [name], MD  69 year old man with significant past medical history for end-stage renal disease on hemodialysis Monday Wednesday Friday, status post DVT on Coumadin, hypertension, diabetes who presents today with severe weakness and found to have a hemoglobin 6.5. On 6/8 he was found to have a hemoglobin of 7.4 and was given 1 unit of PRBCs. His recent history is significant for a right thigh dialysis graft done in March of this year by Dr. Bridgett Larsson.He has already been seen by nephrology and 2 units of PRBCs have been ordered. Right groin ultrasound is done in the ED and shows a large hypoechoic structure consistent with a hematoma.  Plan for surgery on 6/27  Assessment/Plan: Symptomatic anemia -Status post 2 units of PRBCs -Patient has not been having melanotic or bright blood per rectum. FOBT 2 days prior to admission was negative. -Most likely source of patient's anemia is blood loss from growing hematoma due to dehiscence of his AVG. -Dr. Bridgett Larsson: s/p R groin hematoma evacuation, VAC placement -IV iron and Epogen therapy as per renal service discretion  Right foot pain -toe cold -will get doppler to check pulses -treat pain -call in to vascular for further recommendations: arterial duplex and ABIs- ordered  DVT -Has been on Coumadin since March 2016. -heparin gtt for with coumadin restarted 6/28  Heart transplant -Continue immunosuppressive medications.  End-stage renal disease on hemodialysis -Appreciate nephrology following. -will follow rec's and continue HD M-W-F  Hypertension -Stable and well control will continue home antihypertensive regimen -Patient on HD M-W-F which is also helping volume control  Type 2 diabetes -continue Sliding-scale insulin. -A1c 5.6  DVT prophylaxis -SCDs, currently coagulopathic -heparin gtt  Code Status: Full code Family Communication:  no family at bedside Disposition Plan: surgery on Monday   Consultants:  Renal service  Vascular surgery  Procedures: 1. Right groin hematoma evacuation 2. Placement of negative pressure dressing   Antibiotics:  Vancomycin 6/22  HPI/Subjective: C/o pain in right foot  Objective: Filed Vitals:   11/22/14 0556  BP: 118/51  Pulse: 93  Temp: 98.3 F (36.8 C)  Resp: 18    Intake/Output Summary (Last 24 hours) at 11/22/14 1243 Last data filed at 11/22/14 0700  Gross per 24 hour  Intake 214.83 ml  Output   1975 ml  Net -1760.17 ml   Filed Weights   11/18/14 1310 11/21/14 1215 11/21/14 1630  Weight: 71.1 kg (156 lb 12 oz) 74.5 kg (164 lb 3.9 oz) 72.5 kg (159 lb 13.3 oz)    Exam:  General:  A+Ox3, NAD Right toes cold, unable to feel pulses  Data Reviewed: Basic Metabolic Panel:  Recent Labs Lab 11/16/14 1226 11/17/14 0526 11/18/14 0620 11/20/14 0500 11/21/14 0550  NA 140 136 135 135 134*  K 3.2* 3.8 3.9 4.3 4.5  CL 104 101 100* 100* 104  CO2 26 29 26 28 23   GLUCOSE 67 80 85 107* 81  BUN 27* 9 22* 23* 34*  CREATININE 7.39* 3.96* 6.30* 5.99* 7.69*  CALCIUM 8.6* 8.2* 8.6* 8.7* 9.2  PHOS  --   --  3.0 2.3*  --    Liver Function Tests:  Recent Labs Lab 11/18/14 0620 11/20/14 0500  ALBUMIN 2.4* 2.3*   CBC:  Recent Labs Lab 11/16/14 1157  11/17/14 0526 11/18/14 0620 11/20/14 0648 11/21/14 0550 11/22/14 0720  WBC 4.0  --  3.7* 4.4 3.3* 4.0 3.7*  NEUTROABS 1.7  --   --   --   --   --   --  HGB 6.5*  < > 8.5* 8.9* 8.5* 8.5* 8.7*  HCT 21.1*  < > 26.3* 28.4* 27.1* 27.0* 28.3*  MCV 94.6  --  92.6 95.0 95.1 97.5 95.6  PLT 104*  --  81* 77* 76* 91* 84*  < > = values in this interval not displayed. ProBNP (last 3 results)  Recent Labs  03/27/14 1747 04/12/14 0105  PROBNP 37853.0* 45045.0*   CBG:  Recent Labs Lab 11/21/14 0757 11/21/14 1047 11/21/14 1656 11/21/14 2144 11/22/14 0811  GLUCAP 96 94 62* 95 84   Studies: No results  found.  Scheduled Meds: . atorvastatin  40 mg Oral Daily  . azaTHIOprine  75 mg Oral q morning - 10a  . calcium acetate  667 mg Oral TID WC  . carvedilol  6.25 mg Oral QHS  . Chlorhexidine Gluconate Cloth  6 each Topical Q0600  . colesevelam  3,750 mg Oral Daily  . [START ON 11/23/2014] darbepoetin (ARANESP) injection - DIALYSIS  200 mcg Intravenous Q Wed-HD  . doxercalciferol  4 mcg Intravenous Q M,W,F-HD  . famotidine  10 mg Oral QHS  . feeding supplement (PRO-STAT SUGAR FREE 64)  30 mL Oral BID  . feeding supplement (RESOURCE BREEZE)  1 Container Oral BID BM  . ferric gluconate (FERRLECIT/NULECIT) IV  125 mg Intravenous Q M,W,F-HD  . insulin aspart  0-5 Units Subcutaneous QHS  . insulin aspart  0-9 Units Subcutaneous TID WC  . insulin aspart  3 Units Subcutaneous TID WC  . [START ON 11/23/2014] insulin glargine  15 Units Subcutaneous q morning - 10a  . isosorbide dinitrate  40 mg Oral 3 times per day  . multivitamin  1 tablet Oral QHS  . mupirocin ointment  1 application Nasal BID  . predniSONE  5 mg Oral Q breakfast  . sirolimus  1.5 mg Oral Daily  . vancomycin  750 mg Intravenous Q M,W,F-HD  . warfarin  5 mg Oral ONCE-1800  . Warfarin - Pharmacist Dosing Inpatient   Does not apply q1800   Continuous Infusions: . sodium chloride 10 mL/hr at 11/21/14 2214  . heparin 1,200 Units/hr (11/21/14 2210)    Principal Problem:   Symptomatic anemia Active Problems:   DVT (deep venous thrombosis)   Coagulopathy   Heart transplanted   HTN (hypertension), malignant   DM (diabetes mellitus), type 2 with renal complications   ESRD (end stage renal disease) on dialysis    Time spent: 25 minutes   VANN, JESSICA  [facility] Pager [phone]. If 7PM-7AM, please contact night-coverage at [email or web address], password [facility] 11/22/2014, 12:43 PM  LOS: 4 days

## 2014-11-22 NOTE — Progress Notes (Signed)
Subjective:  No cos, awaiting VVS surgery then HD  Objective Vital signs in last 24 hours: Filed Vitals:   11/21/14 1701 11/21/14 2142 11/22/14 0556 11/22/14 1303  BP: 104/55 110/44 118/51 114/43  Pulse: 88 95 93 95  Temp: 97.6 F (36.4 C) 98.3 F (36.8 C) 98.3 F (36.8 C) 99.3 F (37.4 C)  TempSrc: Oral Oral Oral Oral  Resp: 18 18 18 18   Height:      Weight:      SpO2: 100% 100% 96% 99%   Weight change:   Physical Exam: General: Alert OX3 pleasant NAD Heart: RRR , no mur , rub or gallop Lungs:CTA  Abdomen: soft NT, ND Extremities: right LE tr edema, left LE1+ edema; old right upper AVF pulsatile, no bruit Dialysis Access: left  IJ perm cath  and right thigh AVGG + bruit, moist  groin wound OP   OP Dialysis Orders:  [facility] MWF EDW 72 - needs lower EDW at d/c (? 71)2K 2 Ca 4 hr 3000 heparin,  left IJ and right fem AVGG inserted 5/26  Hectorol 4 Aranesp 200 q Wed no Fe   Assessment: 1. Anemia / AVG hematoma 2. ESRD - MWF HD 3. Dehisced AVG wound /hematoma - s/p hematoma evac / VAC placement 6/27 4. Metabolic bone disease - Resumed Hectorol 4/ phos 2.3 - decreased phoslo to bid 5. Heart transplant - po meds Imuran, Rapamune, Prednisone 6. Ho DVT- on chronic coumadin 7. Dm type 2- per primary service 8. Malnutrition - alb 2.3 - renal diet/vits/supplements/nutrition consult 9. Thrombocytopenia - trending up today to 91   Plan - HD tomorrow, no heparin  Kelly Splinter MD pager [phone]    cell [phone] 11/22/2014, 5:01 PM    Labs: Basic Metabolic Panel:  Recent Labs Lab 11/18/14 0620 11/20/14 0500 11/21/14 0550  NA 135 135 134*  K 3.9 4.3 4.5  CL 100* 100* 104  CO2 26 28 23   GLUCOSE 85 107* 81  BUN 22* 23* 34*  CREATININE 6.30* 5.99* 7.69*  CALCIUM 8.6* 8.7* 9.2  PHOS 3.0 2.3*  --    Liver Function Tests:  Recent Labs Lab 11/18/14 0620 11/20/14 0500  ALBUMIN 2.4* 2.3*   No results for input(s): LIPASE, AMYLASE in the last 168  hours. No results for input(s): AMMONIA in the last 168 hours. CBC:  Recent Labs Lab 11/16/14 1157  11/17/14 0526 11/18/14 0620 11/20/14 0648 11/21/14 0550 11/22/14 0720  WBC 4.0  --  3.7* 4.4 3.3* 4.0 3.7*  NEUTROABS 1.7  --   --   --   --   --   --   HGB 6.5*  < > 8.5* 8.9* 8.5* 8.5* 8.7*  HCT 21.1*  < > 26.3* 28.4* 27.1* 27.0* 28.3*  MCV 94.6  --  92.6 95.0 95.1 97.5 95.6  PLT 104*  --  81* 77* 76* 91* 84*  < > = values in this interval not displayed. Cardiac Enzymes: No results for input(s): CKTOTAL, CKMB, CKMBINDEX, TROPONINI in the last 168 hours. CBG:  Recent Labs Lab 11/21/14 1047 11/21/14 1656 11/21/14 2144 11/22/14 0811 11/22/14 1318  GLUCAP 94 62* 95 84 188*    Studies/Results: No results found. Medications: . sodium chloride 10 mL/hr at 11/21/14 2214  . heparin 1,200 Units/hr (11/21/14 2210)   . atorvastatin  40 mg Oral Daily  . azaTHIOprine  75 mg Oral q morning - 10a  . calcium acetate  667 mg Oral TID WC  . carvedilol  6.25 mg  Oral QHS  . Chlorhexidine Gluconate Cloth  6 each Topical Q0600  . colesevelam  3,750 mg Oral Daily  . [START ON 11/23/2014] darbepoetin (ARANESP) injection - DIALYSIS  200 mcg Intravenous Q Wed-HD  . doxercalciferol  4 mcg Intravenous Q M,W,F-HD  . famotidine  10 mg Oral QHS  . feeding supplement (PRO-STAT SUGAR FREE 64)  30 mL Oral BID  . feeding supplement (RESOURCE BREEZE)  1 Container Oral BID BM  . ferric gluconate (FERRLECIT/NULECIT) IV  125 mg Intravenous Q M,W,F-HD  . insulin aspart  0-5 Units Subcutaneous QHS  . insulin aspart  0-9 Units Subcutaneous TID WC  . insulin aspart  3 Units Subcutaneous TID WC  . [START ON 11/23/2014] insulin glargine  15 Units Subcutaneous q morning - 10a  . isosorbide dinitrate  40 mg Oral 3 times per day  . multivitamin  1 tablet Oral QHS  . mupirocin ointment  1 application Nasal BID  . predniSONE  5 mg Oral Q breakfast  . sirolimus  1.5 mg Oral Daily  . vancomycin  750 mg  Intravenous Q M,W,F-HD  . warfarin  5 mg Oral ONCE-1800  . Warfarin - Pharmacist Dosing Inpatient   Does not apply [phone]

## 2014-11-22 NOTE — Progress Notes (Signed)
Inpatient Diabetes Program Recommendations  AACE/ADA: New Consensus Statement on Inpatient Glycemic Control (2013)  Target Ranges:  Prepandial:   less than 140 mg/dL      Peak postprandial:   less than 180 mg/dL (1-2 hours)      Critically ill patients:  140 - 180 mg/dL   Review of Glycemic Control:   Results for Brandon Sharp, Brandon Sharp (MRN [id number]) as of 11/22/2014 11:19  Ref. Range 11/21/2014 07:57 11/21/2014 10:47 11/21/2014 16:56 11/21/2014 21:44 11/22/2014 08:11  Glucose-Capillary Latest Ref Range: 65-99 mg/dL 96 94 62 (L) 95 84    Note CBG's less than 100 mg/dL.  Consider reducing Lantus to 15 units daily.    Thanks, Adah Perl, RN, BC-ADM Inpatient Diabetes Coordinator Pager [phone] (8a-5p)

## 2014-11-22 NOTE — Progress Notes (Signed)
Called to see pt about possible ischemic right foot.  Pt states that he had pain on the top of his midfoot earlier today.  He states that it was painful to touch.    Upon exam by Dr. Trula Slade this afternoon, his pain is resolved.  He can move his toes, which is not any different from last week.  He does have decreased sensation over the foot and above the ankle, but he states this is the same as it was last week.    -continue heparin/coumadin bridge -await ABI and arterial duplex results -no emergent intervention at this time   Leontine Locket 11/22/2014 1:31 PM

## 2014-11-22 NOTE — Progress Notes (Signed)
ANTIBIOTIC/ANTICOAGULATION [place] for vancomycin, heparin Indication: open wound, h/o DVT  Allergies  Allergen Reactions  . Lisinopril Swelling    Lips and tongue swell  . Niacin And Related Other (See Comments)    unknown  . Norvasc [Amlodipine Besylate] Rash    Flushing  . Penicillins Rash    Patient Measurements: Height: 6' (182.9 cm) Weight: 159 lb 13.3 oz (72.5 kg) IBW/kg (Calculated) : 77.6  Vital Signs: Temp: 98.3 F (36.8 C) (06/28 0556) Temp Source: Oral (06/28 0556) BP: 118/51 mmHg (06/28 0556) Pulse Rate: 93 (06/28 0556) Intake/Output from previous day: 06/27 0701 - 06/28 0700 In: 884.8 [I.V.:884.8] Out: 1975 [Drains:75] Intake/Output from this shift:    Labs:  Recent Labs  11/20/14 0500 11/20/14 0648 11/21/14 0550 11/22/14 0720  WBC  --  3.3* 4.0 3.7*  HGB  --  8.5* 8.5* 8.7*  PLT  --  76* 91* 84*  CREATININE 5.99*  --  7.69*  --    Estimated Creatinine Clearance: 9.3 mL/min (by C-G formula based on Cr of 7.69).  Medical History: Past Medical History  Diagnosis Date  . Diabetes mellitus   . Hypertension   . Myocardial infarction 1985; 1990  . Angina   . Dysrhythmia   . DVT (deep venous thrombosis) ~ 12/2010    LLE  . Anemia   . Blood transfusion 06/1999    post heart transplant  . Peripheral vascular disease   . Renal failure     Hemodialysis MWF last 2 years, sse dr [name] nephrology, goes to [facility] [facility]  . DVT (deep venous thrombosis) 08/25/2014    Nearly occluded R IJ, and L subclavian DVT  . CHF (congestive heart failure) 2001    beffore transplant; had AICD that was explanted 07/19/99 following successful heart transplant  . Coronary artery disease     transplant cardiologist Dr. Corky Mull [facility])  . Pneumonia     Medications:  Prescriptions prior to admission  Medication Sig Dispense Refill Last Dose  . atorvastatin (LIPITOR) 40 MG tablet Take 40 mg by mouth daily.     11/15/2014  at Unknown time  . azaTHIOprine (IMURAN) 50 MG tablet Take 75 mg by mouth every morning.    11/16/2014 at Unknown time  . b complex-vitamin c-folic acid (NEPHRO-VITE) 0.8 MG TABS tablet Take 1 tablet by mouth at bedtime.   11/16/2014 at Unknown time  . calcium acetate (PHOSLO) 667 MG capsule Take 667 mg by mouth 3 (three) times daily with meals.    11/16/2014 at Unknown time  . carvedilol (COREG) 6.25 MG tablet Take 1 tablet (6.25 mg total) by mouth at bedtime. 30 tablet 0 11/15/2014 at 2000  . colesevelam (WELCHOL) 625 MG tablet Take 3,750 mg by mouth daily. Takes 6 tabs daily   11/15/2014 at Unknown time  . famotidine (PEPCID AC) 10 MG chewable tablet Chew 10 mg by mouth at bedtime.    11/15/2014 at Unknown time  . HUMALOG KWIKPEN 100 UNIT/ML SOPN Inject 4-10 Units into the skin 2 (two) times daily. Per sliding scale   11/16/2014 at Unknown time  . insulin glargine (LANTUS) 100 UNIT/ML injection Inject 20 Units into the skin every morning.   11/16/2014 at Unknown time  . isosorbide dinitrate (ISOCHRON) 40 MG CR tablet Take 40 mg by mouth every 8 (eight) hours.  11 11/16/2014 at Unknown time  . predniSONE (DELTASONE) 5 MG tablet Take 1 tablet (5 mg total) by mouth daily with  breakfast. 30 tablet 0 11/16/2014 at Unknown time  . Sirolimus 0.5 MG TABS Take 3 tablets by mouth daily.  11 11/16/2014 at Unknown time  . warfarin (COUMADIN) 5 MG tablet Take 1 tablet (5 mg total) by mouth daily. 30 tablet 0 11/15/2014 at Unknown time   Scheduled:  . atorvastatin  40 mg Oral Daily  . azaTHIOprine  75 mg Oral q morning - 10a  . calcium acetate  667 mg Oral TID WC  . carvedilol  6.25 mg Oral QHS  . Chlorhexidine Gluconate Cloth  6 each Topical Q0600  . colesevelam  3,750 mg Oral Daily  . collagenase   Topical Daily  . [START ON 11/23/2014] darbepoetin (ARANESP) injection - DIALYSIS  200 mcg Intravenous Q Wed-HD  . doxercalciferol  4 mcg Intravenous Q M,W,F-HD  . famotidine  10 mg Oral QHS  . feeding supplement  (PRO-STAT SUGAR FREE 64)  30 mL Oral BID  . feeding supplement (RESOURCE BREEZE)  1 Container Oral BID BM  . ferric gluconate (FERRLECIT/NULECIT) IV  125 mg Intravenous Q M,W,F-HD  . insulin aspart  0-5 Units Subcutaneous QHS  . insulin aspart  0-9 Units Subcutaneous TID WC  . insulin aspart  3 Units Subcutaneous TID WC  . insulin glargine  20 Units Subcutaneous q morning - 10a  . isosorbide dinitrate  40 mg Oral 3 times per day  . multivitamin  1 tablet Oral QHS  . mupirocin ointment  1 application Nasal BID  . predniSONE  5 mg Oral Q breakfast  . sirolimus  1.5 mg Oral Daily  . vancomycin  750 mg Intravenous Q M,W,F-HD   Infusions:  . sodium chloride 10 mL/hr at 11/21/14 2214  . heparin 1,200 Units/hr (11/21/14 2210)    Assessment: 69 yo male on chronic coumadin for hx of DVT, s/p hematoma evacuation 6/27. Heparin resumed after last night, now to restart coumadin. INR 1.66 this morning. He hasn't received coumadin since admission. Hgb 8.7, Plt 84K, both are low at baseline. Heparin level (0.41) stable on 1200 units/hr this morning.  Coumadin PTA dose: 5mg  daily  Goal of Therapy:  Heparin level 0.3-0.7 units/ml  Plan:  - Continue heparin infusion 1200 units/hr - Coumadin 5mg  po x 1 - Daily HL, PT/INR, CBC - Monitor for s/sx bleeding  Maryanna Shape, PharmD, BCPS  Clinical Pharmacist  Pager: [phone]  11/22/2014 10:21 AM

## 2014-11-22 NOTE — Consult Note (Addendum)
WOC consult requested for Vac dressing change to right groin on Wed; surgery was performed yesterday. Called bedside nurse who states Vac is intact with good seal. VVS Surgeon can remove first post-op dressing tomorrow to assess the wound and WOC will re-apply the Vac as requested. Julien Girt MSN, RN, [place], [place], [place]

## 2014-11-22 NOTE — Progress Notes (Addendum)
*  PRELIMINARY RESULTS* Vascular Ultrasound Right lower extremity arterial duplex has been completed.  Preliminary findings: Triphasic/ biphasic flow in right CFA and right SFA. Right popliteal artery demonstrates monophasic flow with a 50-99% stenosis noted right distal popliteal artery. Monophasic flow noted in the right PTA and ATA.  Highly calcified vessels noted throughout entire right lower extremity. Very likely that ABI pressures can not be obtained. Please advise if ABI should still be attempted.    Landry Mellow, RDMS, RVT  11/22/2014, 3:43 PM

## 2014-11-22 NOTE — Progress Notes (Signed)
Patient hypoglycemic, blood sugar of 61 mg/dL. Pt is A&O and asymptomatic. Given 4oz of juice and blood sugar rechecked. Last value was 70 mg/dL. Will continue to monitor.

## 2014-11-22 NOTE — Care Management (Signed)
Medicare Important Message provided to patient.

## 2014-11-23 ENCOUNTER — Ambulatory Visit (HOSPITAL_COMMUNITY): Payer: Medicare Other

## 2014-11-23 LAB — CBC
HCT: 24.4 % — ABNORMAL LOW (ref 39.0–52.0)
HCT: 28.2 % — ABNORMAL LOW (ref 39.0–52.0)
Hemoglobin: 7.5 g/dL — ABNORMAL LOW (ref 13.0–17.0)
Hemoglobin: 8.8 g/dL — ABNORMAL LOW (ref 13.0–17.0)
MCH: 29.9 pg (ref 26.0–34.0)
MCH: 29.5 pg (ref 26.0–34.0)
MCHC: 30.7 g/dL (ref 30.0–36.0)
MCHC: 31.2 g/dL (ref 30.0–36.0)
MCV: 97.2 fL (ref 78.0–100.0)
MCV: 94.6 fL (ref 78.0–100.0)
PLATELETS: 65 10*3/uL — AB (ref 150–400)
Platelets: 69 10*3/uL — ABNORMAL LOW (ref 150–400)
RBC: 2.51 MIL/uL — ABNORMAL LOW (ref 4.22–5.81)
RBC: 2.98 MIL/uL — AB (ref 4.22–5.81)
RDW: 18 % — ABNORMAL HIGH (ref 11.5–15.5)
RDW: 17.9 % — ABNORMAL HIGH (ref 11.5–15.5)
WBC: 3.8 10*3/uL — AB (ref 4.0–10.5)
WBC: 5.4 10*3/uL (ref 4.0–10.5)

## 2014-11-23 LAB — GLUCOSE, CAPILLARY
GLUCOSE-CAPILLARY: 36 mg/dL — AB (ref 65–99)
GLUCOSE-CAPILLARY: 101 mg/dL — AB (ref 65–99)
Glucose-Capillary: 35 mg/dL — CL (ref 65–99)
Glucose-Capillary: 44 mg/dL — CL (ref 65–99)
Glucose-Capillary: 86 mg/dL (ref 65–99)
Glucose-Capillary: 83 mg/dL (ref 65–99)
Glucose-Capillary: 163 mg/dL — ABNORMAL HIGH (ref 65–99)

## 2014-11-23 LAB — PROTIME-INR
INR: 1.59 — ABNORMAL HIGH (ref 0.00–1.49)
Prothrombin Time: 19 seconds — ABNORMAL HIGH (ref 11.6–15.2)

## 2014-11-23 LAB — RENAL FUNCTION PANEL
Albumin: 2.6 g/dL — ABNORMAL LOW (ref 3.5–5.0)
Anion gap: 8 (ref 5–15)
BUN: 12 mg/dL (ref 6–20)
CALCIUM: 8 mg/dL — AB (ref 8.9–10.3)
CO2: 30 mmol/L (ref 22–32)
Chloride: 96 mmol/L — ABNORMAL LOW (ref 101–111)
Creatinine, Ser: 3.17 mg/dL — ABNORMAL HIGH (ref 0.61–1.24)
GFR calc non Af Amer: 19 mL/min — ABNORMAL LOW (ref >60)
GFR, EST AFRICAN AMERICAN: 21 mL/min — AB (ref >60)
Glucose, Bld: 130 mg/dL — ABNORMAL HIGH (ref 65–99)
PHOSPHORUS: 1.5 mg/dL — AB (ref 2.5–4.6)
POTASSIUM: 3.6 mmol/L (ref 3.5–5.1)
SODIUM: 134 mmol/L — AB (ref 135–145)

## 2014-11-23 LAB — HEPARIN LEVEL (UNFRACTIONATED)
HEPARIN UNFRACTIONATED: 0.77 [IU]/mL — AB (ref 0.30–0.70)
Heparin Unfractionated: 0.33 IU/mL (ref 0.30–0.70)

## 2014-11-23 MED ORDER — SODIUM CHLORIDE 0.9 % IV SOLN: 100.0000 mL | INTRAVENOUS | Status: DC | PRN

## 2014-11-23 MED ORDER — HEPARIN SODIUM (PORCINE) 1000 UNIT/ML DIALYSIS: 1000.0000 [IU] | INTRAMUSCULAR | Status: DC | PRN

## 2014-11-23 MED ORDER — DEXTROSE 50 % IV SOLN
INTRAVENOUS | Status: AC
  Filled 2014-11-23: qty 50

## 2014-11-23 MED ORDER — ALTEPLASE 2 MG IJ SOLR
2.0000 mg | Freq: Once | INTRAMUSCULAR | Status: DC | PRN
  Filled 2014-11-23: qty 2

## 2014-11-23 MED ORDER — DARBEPOETIN ALFA 200 MCG/0.4ML IJ SOSY
PREFILLED_SYRINGE | INTRAMUSCULAR | Status: AC
  Administered 2014-11-23: 200 ug via INTRAVENOUS
  Filled 2014-11-23: qty 0.4

## 2014-11-23 MED ORDER — LIDOCAINE-PRILOCAINE 2.5-2.5 % EX CREA
1.0000 "application " | TOPICAL_CREAM | CUTANEOUS | Status: DC | PRN
  Filled 2014-11-23: qty 5

## 2014-11-23 MED ORDER — PENTAFLUOROPROP-TETRAFLUOROETH EX AERO: 1.0000 "application " | INHALATION_SPRAY | CUTANEOUS | Status: DC | PRN

## 2014-11-23 MED ORDER — HYDROMORPHONE HCL 1 MG/ML IJ SOLN
1.0000 mg | Freq: Once | INTRAMUSCULAR | Status: AC
  Administered 2014-11-23: 1 mg via INTRAVENOUS
  Filled 2014-11-23: qty 1

## 2014-11-23 MED ORDER — GLUCAGON HCL RDNA (DIAGNOSTIC) 1 MG IJ SOLR
1.0000 mg | Freq: Once | INTRAMUSCULAR | Status: AC | PRN
  Administered 2014-11-23: 1 mg via INTRAMUSCULAR
  Filled 2014-11-23: qty 1

## 2014-11-23 MED ORDER — NEPRO/CARBSTEADY PO LIQD
237.0000 mL | ORAL | Status: DC | PRN
  Filled 2014-11-23: qty 237

## 2014-11-23 MED ORDER — WARFARIN SODIUM 5 MG PO TABS
5.0000 mg | ORAL_TABLET | Freq: Once | ORAL | Status: DC
  Filled 2014-11-23 (×3): qty 1

## 2014-11-23 MED ORDER — DOXERCALCIFEROL 4 MCG/2ML IV SOLN
INTRAVENOUS | Status: AC
  Administered 2014-11-23: 4 ug via INTRAVENOUS
  Filled 2014-11-23: qty 2

## 2014-11-23 MED ORDER — LIDOCAINE HCL (PF) 1 % IJ SOLN: 5.0000 mL | INTRAMUSCULAR | Status: DC | PRN

## 2014-11-23 MED ORDER — INSULIN GLARGINE 100 UNIT/ML ~~LOC~~ SOLN
10.0000 [IU] | Freq: Every morning | SUBCUTANEOUS | Status: DC
  Filled 2014-11-23: qty 0.1

## 2014-11-23 NOTE — Progress Notes (Addendum)
Per Eliseo Squires MD, ok to stop heparin drip tonight for Vanc administration, then heparin to be restarted again.

## 2014-11-23 NOTE — Progress Notes (Signed)
[facility] PROGRESS NOTE  CRISTINA MATTERN IZT:[id number] DOB: 1945-09-29 DOA: 11/16/2014 PCP: [name], MD  69 year old man with significant past medical history for end-stage renal disease on hemodialysis Monday Wednesday Friday, status post DVT on Coumadin, hypertension, diabetes who presents today with severe weakness and found to have a hemoglobin 6.5. On 6/8 he was found to have a hemoglobin of 7.4 and was given 1 unit of PRBCs. His recent history is significant for a right thigh dialysis graft done in March of this year by Dr. Bridgett Larsson.He has already been seen by nephrology and 2 units of PRBCs have been ordered. Right groin ultrasound is done in the ED and shows a large hypoechoic structure consistent with a hematoma.  Plan for surgery on 6/27  Assessment/Plan: Symptomatic anemia -Status post 2 units of PRBCs -Patient has not been having melanotic or bright blood per rectum. FOBT 2 days prior to admission was negative. -Most likely source of patient's anemia is blood loss from growing hematoma due to dehiscence of his AVG. -Dr. Bridgett Larsson: s/p R groin hematoma evacuation, VAC placement -IV iron and Epogen therapy as per renal service discretion  hypoglycemia -d/c levemir -keep SSI  DVT -Has been on Coumadin since March 2016. -heparin gtt for with coumadin restarted 6/28  Heart transplant -Continue immunosuppressive medications.  End-stage renal disease on hemodialysis -Appreciate nephrology following. -will follow rec's and continue HD M-W-F  Hypertension -Stable and well control will continue home antihypertensive regimen -Patient on HD M-W-F which is also helping volume control  Type 2 diabetes -continue Sliding-scale insulin. -A1c 5.6  DVT prophylaxis -SCDs, currently coagulopathic -heparin gtt  Code Status: Full code Family Communication:  family at bedside Disposition Plan: surgery on Monday   Consultants:  Renal service  Vascular  surgery  Procedures: 1. Right groin hematoma evacuation 2. Placement of negative pressure dressing   Antibiotics:  Vancomycin 6/22  HPI/Subjective: No overnight events Ate lunch well  Objective: Filed Vitals:   11/23/14 0553  BP: 142/51  Pulse: 80  Temp: 97.8 F (36.6 C)  Resp: 18    Intake/Output Summary (Last 24 hours) at 11/23/14 1308 Last data filed at 11/23/14 0949  Gross per 24 hour  Intake    640 ml  Output      0 ml  Net    640 ml   Filed Weights   11/18/14 1310 11/21/14 1215 11/21/14 1630  Weight: 71.1 kg (156 lb 12 oz) 74.5 kg (164 lb 3.9 oz) 72.5 kg (159 lb 13.3 oz)    Exam:  General:  A+Ox3, NAD rrr clear  Data Reviewed: Basic Metabolic Panel:  Recent Labs Lab 11/17/14 0526 11/18/14 0620 11/20/14 0500 11/21/14 0550 11/23/14 1205  NA 136 135 135 134* 134*  K 3.8 3.9 4.3 4.5 3.6  CL 101 100* 100* 104 96*  CO2 29 26 28 23 30   GLUCOSE 80 85 107* 81 130*  BUN 9 22* 23* 34* 12  CREATININE 3.96* 6.30* 5.99* 7.69* 3.17*  CALCIUM 8.2* 8.6* 8.7* 9.2 8.0*  PHOS  --  3.0 2.3*  --  1.5*   Liver Function Tests:  Recent Labs Lab 11/18/14 0620 11/20/14 0500 11/23/14 1205  ALBUMIN 2.4* 2.3* 2.6*   CBC:  Recent Labs Lab 11/20/14 0648 11/21/14 0550 11/22/14 0720 11/23/14 0536 11/23/14 1205  WBC 3.3* 4.0 3.7* 3.8* 5.4  HGB 8.5* 8.5* 8.7* 7.5* 8.8*  HCT 27.1* 27.0* 28.3* 24.4* 28.2*  MCV 95.1 97.5 95.6 97.2 94.6  PLT 76* 91* 84* 69*  65*   ProBNP (last 3 results)  Recent Labs  03/27/14 1747 04/12/14 0105  PROBNP 37853.0* 45045.0*   CBG:  Recent Labs Lab 11/22/14 2238 11/23/14 0733 11/23/14 0802 11/23/14 0820 11/23/14 0909  GLUCAP 102* 44* 36* 35* 86   Studies: No results found.  Scheduled Meds: . atorvastatin  40 mg Oral Daily  . azaTHIOprine  75 mg Oral q morning - 10a  . calcium acetate  667 mg Oral TID WC  . carvedilol  6.25 mg Oral QHS  . Chlorhexidine Gluconate Cloth  6 each Topical Q0600  . colesevelam   3,750 mg Oral Daily  . darbepoetin (ARANESP) injection - DIALYSIS  200 mcg Intravenous Q Wed-HD  . dextrose      . doxercalciferol  4 mcg Intravenous Q M,W,F-HD  . famotidine  10 mg Oral QHS  . feeding supplement (PRO-STAT SUGAR FREE 64)  30 mL Oral BID  . feeding supplement (RESOURCE BREEZE)  1 Container Oral BID BM  . ferric gluconate (FERRLECIT/NULECIT) IV  125 mg Intravenous Q M,W,F-HD  . insulin aspart  0-5 Units Subcutaneous QHS  . insulin aspart  0-9 Units Subcutaneous TID WC  . insulin aspart  3 Units Subcutaneous TID WC  . isosorbide dinitrate  40 mg Oral 3 times per day  . multivitamin  1 tablet Oral QHS  . mupirocin ointment  1 application Nasal BID  . predniSONE  5 mg Oral Q breakfast  . sirolimus  1.5 mg Oral Daily  . vancomycin  750 mg Intravenous Q M,W,F-HD  . warfarin  5 mg Oral ONCE-1800  . Warfarin - Pharmacist Dosing Inpatient   Does not apply q1800   Continuous Infusions: . sodium chloride 10 mL/hr at 11/21/14 2214  . heparin Stopped (11/23/14 2778)    Principal Problem:   Symptomatic anemia Active Problems:   DVT (deep venous thrombosis)   Coagulopathy   Heart transplanted   HTN (hypertension), malignant   DM (diabetes mellitus), type 2 with renal complications   ESRD (end stage renal disease) on dialysis    Time spent: 25 minutes   Nautika Cressey  [facility] Pager [phone]. If 7PM-7AM, please contact night-coverage at [email or web address], password [facility] 11/23/2014, 1:08 PM  LOS: 5 days

## 2014-11-23 NOTE — Consult Note (Addendum)
WOC wound consult note Pt is followed by VVS service for assessment and plan of care to right groin wound. Reason for Consult: Consult requested for right groin Vac dressing change.  Dr Bridgett Larsson at the bedside to assess the wound during the first post-op dressing change. Wound type: Full thickness post-op incision Measurement: 1X7X.8cm Wound bed: 100% beefy red Drainage (amount, consistency, odor) Small amt pink drainage in the cannister, no odor Periwound: Intact skin surrounding Dressing procedure/placement/frequency: Applied one piece of black foam to 171mm cont suction.  Pt tolerated with minimal c/o pain.  Plan for bedside nurses to change Q M/W/F. Please re-consult if further assistance is needed.  Thank-you,  Julien Girt MSN, [place], [place], [place], [place]

## 2014-11-23 NOTE — Progress Notes (Addendum)
Nutrition Follow-up  DOCUMENTATION CODES:  Not applicable  INTERVENTION:  Boost Breeze, Snacks, Prostat  NUTRITION DIAGNOSIS:  Increased nutrient needs related to wound healing as evidenced by estimated needs.  Ongoing  GOAL:  Patient will meet greater than or equal to 90% of their needs  Progressing  MONITOR:  PO intake, Supplement acceptance, Weight trends, Labs, I & O's  REASON FOR ASSESSMENT:  Consult Wound healing  ASSESSMENT: Pt with significant past medical history for end-stage renal disease on HD, status post DVT on Coumadin, hypertension, diabetes who presents today with severe weakness and found to have a hemoglobin 6.5. S/p R thigh AVG complicated with hematoma and R groin wound complication.  S/p PROCEDURE on 11/21/14: 1. Right groin hematoma evacuation 2. Placement of negative pressure dressing  Pt off unit at time of visit.   Pt remains with wound vac for full thickness rt groin wound. Last recorded output was 75 ml on 11/22/14. Reviewed COWRN note from earlier today; received VAC dressing change today.   Pt is consuming 65-100% of meals on renal diet per doc flowsheets. He is also consuming Facilities manager and Prostat supplements, per MAR.   Labs reviewed: Na: 134.   Height:  Ht Readings from Last 1 Encounters:  11/16/14 6' (1.829 m)    Weight:  Wt Readings from Last 1 Encounters:  11/23/14 163 lb 2.3 oz (74 kg)    Ideal Body Weight:  81 kg  Wt Readings from Last 10 Encounters:  11/23/14 163 lb 2.3 oz (74 kg)  11/08/14 165 lb (74.844 kg)  10/20/14 159 lb (72.122 kg)  10/11/14 161 lb (73.029 kg)  09/29/14 161 lb 9.6 oz (73.301 kg)  08/29/14 158 lb 15.2 oz (72.1 kg)  06/21/14 159 lb (72.122 kg)  05/11/14 154 lb 1.6 oz (69.9 kg)  05/03/14 158 lb 8 oz (71.895 kg)  04/26/14 163 lb (73.936 kg)    BMI:  Body mass index is 22.12 kg/(m^2).  Estimated Nutritional Needs:  Kcal:  2100-2300  Protein:  110-120 grams  Fluid:  >1.5  L  Skin:  Wound (see comment) (wound vac to rt groin)  Diet Order:  Diet renal with fluid restriction Fluid restriction:: 1200 mL Fluid; Room service appropriate?: Yes; Fluid consistency:: Thin Diet NPO time specified Except for: Sips with Meds  EDUCATION NEEDS:  No education needs identified at this time   Intake/Output Summary (Last 24 hours) at 11/23/14 1451 Last data filed at 11/23/14 0949  Gross per 24 hour  Intake    640 ml  Output      0 ml  Net    640 ml    Last BM:  11/21/14  Nasim Habeeb A. [name], RD, LDN, CDE Pager: [phone] After hours Pager: [phone]

## 2014-11-23 NOTE — Progress Notes (Signed)
Inpatient Diabetes Program Recommendations  AACE/ADA: New Consensus Statement on Inpatient Glycemic Control (2013)  Target Ranges:  Prepandial:   less than 140 mg/dL      Peak postprandial:   less than 180 mg/dL (1-2 hours)      Critically ill patients:  140 - 180 mg/dL   Inpatient Diabetes Program Recommendations Insulin - Basal: DC Lantus until CBGs stable Insulin - Meal Coverage: DC meal coverage Novolog until CBGs stable  (txt pg sent to Dr. Eliseo Squires) Thank you  Raoul Pitch BSN, RN,CDE Inpatient Diabetes Coordinator [phone] (team pager)

## 2014-11-23 NOTE — Progress Notes (Addendum)
RN unable to reach on call nephrologist Augustin Coupe, MD to get order for giving IVPB Vancomycin through pt's Hemo cath. Baltazar Najjar, NP paged and notified.  Baltazar Najjar NP called back, instructed to hold Vancomycin tonight and notify nephrologist in the morning for Vancomycin to be given via pt's Hemo cath. 12:26 AM

## 2014-11-23 NOTE — Progress Notes (Addendum)
Nephrologist Lin MD paged in order to give IVPB Vancomycin through [facility] cath.

## 2014-11-23 NOTE — Progress Notes (Addendum)
[facility] Progress Note  Subjective  - POD #2  Pain in right foot improved.  Objective Filed Vitals:   11/23/14 0553  BP: 142/51  Pulse: 80  Temp: 97.8 F (36.6 C)  Resp: 18    Intake/Output Summary (Last 24 hours) at 11/23/14 0827 Last data filed at 11/23/14 0807  Gross per 24 hour  Intake    800 ml  Output      0 ml  Net    800 ml    Wound vac seal intact. Right foot warm. Sensation and motor intact.   Assessment/Planning: 69 y.o. male is s/p: R groin hematoma evacuation, VAC placement 2 Days Post-Op   - Pain in right foot improved. Foot feels warm without ischemic changes. Arterial duplex yesterday revealing triphasic/biphasic flow right CFA and SFA and monophasic flow in right PT and AT.  - Wound culture NGTD. Continue abx.  - For VAC dressing change today.   Alvia Grove 11/23/2014 8:27 AM --  Laboratory CBC    Component Value Date/Time   WBC 3.8* 11/23/2014 0536   WBC 4.5 02/13/2012 1314   HGB 7.5* 11/23/2014 0536   HGB 11.6* 02/13/2012 1314   HCT 24.4* 11/23/2014 0536   HCT 36.1* 02/13/2012 1314   PLT 69* 11/23/2014 0536   PLT 76* 02/13/2012 1314    BMET    Component Value Date/Time   NA 134* 11/21/2014 0550   K 4.5 11/21/2014 0550   CL 104 11/21/2014 0550   CO2 23 11/21/2014 0550   GLUCOSE 81 11/21/2014 0550   BUN 34* 11/21/2014 0550   CREATININE 7.69* 11/21/2014 0550   CALCIUM 9.2 11/21/2014 0550   GFRNONAA 6* 11/21/2014 0550   GFRAA 7* 11/21/2014 0550    COAG Lab Results  Component Value Date   INR 1.59* 11/23/2014   INR 1.66* 11/22/2014   INR 1.73* 11/21/2014   No results found for: PTT  Antibiotics Anti-infectives    Start     Dose/Rate Route Frequency Ordered Stop   11/21/14 0921  vancomycin (VANCOCIN) 1 GM/200ML IVPB    Comments:  Mikeal Hawthorne, John   : cabinet override      11/21/14 0921 11/21/14 1025   11/18/14 1200  vancomycin (VANCOCIN) IVPB 750 mg/150 ml premix     750 mg 150 mL/hr over 60  Minutes Intravenous Every M-W-F (Hemodialysis) 11/17/14 0051     11/17/14 0100  vancomycin (VANCOCIN) 1,500 mg in sodium chloride 0.9 % 500 mL IVPB     1,500 mg 250 mL/hr over 120 Minutes Intravenous  Once 11/17/14 0051 11/17/14 0407     Virgina Jock, PA-C [facility] Office: [phone] Pager: [phone] 11/23/2014 8:27 AM  Addendum  I have independently interviewed and examined the patient, and I agree with the physician assistant's findings.  Right groin wound clean with early granulation tissue.  +thrill, +bruit.  During patient's R thigh AVG placement, his common femoral artery and superficial femoral artery were both severely calcified.  Unfortunately, this degree of peripheral arterial disease and calcification of such predisposes this patient to development of gangrene in his right foot due steal syndrome.  The arterial duplex reinforces the PAD concerns, given popliteal artery stenosis on imaging.  Rather than waiting and observing for development of such, I favor: Aortogram, R leg angiogram, possible intervention.  If he has a significant popliteal lesion, orbital atherectomy with angioplasty likely needed to avoid future gangrene in right foot.  - Hold Coumadin - Continue Heparin -  If INR < 1.5, will proceed with procedure tomorrow.  Adele Barthel, MD [facility] [place] Office: [phone] Pager: [phone]  11/23/2014, 8:59 AM

## 2014-11-23 NOTE — Progress Notes (Signed)
ANTICOAGULATION CONSULT NOTE - Follow Up Consult  Pharmacy Consult for Heparin/coumadin Indication: h/o DVT  Allergies  Allergen Reactions  . Lisinopril Swelling    Lips and tongue swell  . Niacin And Related Other (See Comments)    unknown  . Norvasc [Amlodipine Besylate] Rash    Flushing  . Penicillins Rash    Patient Measurements: Height: 6' (182.9 cm) Weight: 159 lb 13.3 oz (72.5 kg) IBW/kg (Calculated) : 77.6 Heparin Dosing Weight: 73 kg  Vital Signs: Temp: 97.8 F (36.6 C) (06/29 0553) Temp Source: Oral (06/29 0553) BP: 142/51 mmHg (06/29 0553) Pulse Rate: 80 (06/29 0553)  Labs:  Recent Labs  11/20/14 0648 11/21/14 0550 11/22/14 0720 11/23/14 0536  HGB 8.5* 8.5* 8.7*  --   HCT 27.1* 27.0* 28.3*  --   PLT 76* 91* 84*  --   LABPROT 22.5* 20.3* 19.7* 19.0*  INR 2.00* 1.73* 1.66* 1.59*  HEPARINUNFRC 0.38  --  0.41 0.77*  CREATININE  --  7.69*  --   --     Estimated Creatinine Clearance: 9.3 mL/min (by C-G formula based on Cr of 7.69).  Assessment: 69 y.o. on Coumadin PTA for hx of DVT, s/p hematoma evacuation 6/27. Pt on heparin bridge to coumadin. Heparin level supratherapeutic (0.77) on 1200 units/hr. INR continues downward trend even though coumadin resumed yesterday. Hgb and plt low but stable. No bleeding noted.  Goal of Therapy:  INR 2-3; Heparin level 0.3-0.7 units/ml Monitor platelets by anticoagulation protocol: Yes   Plan:  Coumadin 5mg  again tonight Decrease heparin to 1100 units/hr F/u 6 hr heparin level Daily INR, CBC, and heparin level  Sherlon Handing, PharmD, BCPS Clinical pharmacist, pager [phone] 11/23/2014,6:41 AM

## 2014-11-23 NOTE — Progress Notes (Signed)
Hypoglycemic Event  CBG: 44  Treatment:  Given orange juice 2 times and no increase, glucagon IM given   Symptoms: None  Follow-up CBG: Time: CBG Result:89  Possible Reasons for Event: Medication regimen: Lantus, now discontinued by MD   Comments/MD notified: Eliseo Squires MD aware     Audria Nine F  Remember to initiate Hypoglycemia Order Set & complete

## 2014-11-23 NOTE — Progress Notes (Signed)
Subjective:  No cos, on HD  Objective Vital signs in last 24 hours: Filed Vitals:   11/22/14 0556 11/22/14 1303 11/22/14 2126 11/23/14 0553  BP: 118/51 114/43 145/56 142/51  Pulse: 93 95 88 80  Temp: 98.3 F (36.8 C) 99.3 F (37.4 C) 99.3 F (37.4 C) 97.8 F (36.6 C)  TempSrc: Oral Oral Oral Oral  Resp: 18 18 18 18   Height:      Weight:      SpO2: 96% 99% 99% 99%   Weight change:   Physical Exam: General: Alert OX3 pleasant NAD Heart: RRR , no mur , rub or gallop Lungs:CTA  Abdomen: soft NT, ND Extremities: right LE tr edema, left LE1+ edema; old right upper AVF pulsatile, no bruit Dialysis Access: left  IJ perm cath  and right thigh AVGG + bruit, moist  groin wound OP    NW MWF 4h   72kg   Bath 2/2  Heparin 3000  Left IJ/ R fem AVG 5/26 Hectorol 4 Aranesp 200 q Wed no Fe   Assessment:  1. AVG hematoma s/p evacuation+ wound VAC 6/27  2. Anemia - due to AVG bleed / CKD, s/p transfusion. Hb 7.5, on max esa and Fe load 3. DVT bilat UE on coumadin since April '16 4. ESRD - MWF HD 5. Metabolic bone disease - Resumed Hectorol 4/ phos 2.3 - decreased phoslo to bid 6. 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 Volume  RLE/ RUE edema focal, try to lower dry wt some if BP will tolerate 8. HTN on coreg/ imdur BP's ok 9. 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 Dm type 2- per primary service 11. Malnutrition - alb 2.3 - renal diet/vits/supplements/nutrition consult 12. Thrombocytopenia  Plan - HD today , no hep, UF 3kg  Kelly Splinter MD pager [phone]    cell [phone] 11/23/2014, 10:38 AM    Labs: Basic Metabolic Panel:  Recent Labs Lab 11/18/14 0620 11/20/14 0500 11/21/14 0550  NA 135 135 134*  K 3.9 4.3 4.5  CL 100* 100* 104  CO2 26 28 23   GLUCOSE 85 107* 81  BUN 22* 23* 34*  CREATININE 6.30* 5.99* 7.69*  CALCIUM 8.6* 8.7* 9.2  PHOS 3.0 2.3*  --    Liver Function Tests:  Recent Labs Lab 11/18/14 0620  11/20/14 0500  ALBUMIN 2.4* 2.3*   No results for input(s): LIPASE, AMYLASE in the last 168 hours. No results for input(s): AMMONIA in the last 168 hours. CBC:  Recent Labs Lab 11/16/14 1157  11/18/14 0620 11/20/14 0648 11/21/14 0550 11/22/14 0720 11/23/14 0536  WBC 4.0  < > 4.4 3.3* 4.0 3.7* 3.8*  NEUTROABS 1.7  --   --   --   --   --   --   HGB 6.5*  < > 8.9* 8.5* 8.5* 8.7* 7.5*  HCT 21.1*  < > 28.4* 27.1* 27.0* 28.3* 24.4*  MCV 94.6  < > 95.0 95.1 97.5 95.6 97.2  PLT 104*  < > 77* 76* 91* 84* 69*  < > = values in this interval not displayed. Cardiac Enzymes: No results for input(s): CKTOTAL, CKMB, CKMBINDEX, TROPONINI in the last 168 hours. CBG:  Recent Labs Lab 11/22/14 2238 11/23/14 0733 11/23/14 0802 11/23/14 0820 11/23/14 0909  GLUCAP 102* 44* 36* 35* 86    Studies/Results: No results found. Medications: . sodium chloride 10 mL/hr at 11/21/14 2214  . heparin Stopped (11/23/14 4103)   . atorvastatin  40 mg Oral Daily  . azaTHIOprine  75 mg Oral q morning - 10a  . calcium acetate  667 mg Oral TID WC  . carvedilol  6.25 mg Oral QHS  . Chlorhexidine Gluconate Cloth  6 each Topical Q0600  . colesevelam  3,750 mg Oral Daily  . darbepoetin (ARANESP) injection - DIALYSIS  200 mcg Intravenous Q Wed-HD  . dextrose      . doxercalciferol  4 mcg Intravenous Q M,W,F-HD  . famotidine  10 mg Oral QHS  . feeding supplement (PRO-STAT SUGAR FREE 64)  30 mL Oral BID  . feeding supplement (RESOURCE BREEZE)  1 Container Oral BID BM  . ferric gluconate (FERRLECIT/NULECIT) IV  125 mg Intravenous Q M,W,F-HD  . insulin aspart  0-5 Units Subcutaneous QHS  . insulin aspart  0-9 Units Subcutaneous TID WC  . insulin aspart  3 Units Subcutaneous TID WC  . isosorbide dinitrate  40 mg Oral 3 times per day  . multivitamin  1 tablet Oral QHS  . mupirocin ointment  1 application Nasal BID  . predniSONE  5 mg Oral Q breakfast  . sirolimus  1.5 mg Oral Daily  . vancomycin  750 mg  Intravenous Q M,W,F-HD  . warfarin  5 mg Oral ONCE-1800  . Warfarin - Pharmacist Dosing Inpatient   Does not apply [phone]

## 2014-11-24 ENCOUNTER — Ambulatory Visit (HOSPITAL_COMMUNITY): Admission: RE | Admit: 2014-11-24 | Payer: Medicare Other | Source: Ambulatory Visit | Admitting: Vascular Surgery

## 2014-11-24 LAB — WOUND CULTURE
CULTURE: NO GROWTH
Gram Stain: NONE SEEN

## 2014-11-24 LAB — GLUCOSE, CAPILLARY
Glucose-Capillary: 83 mg/dL (ref 65–99)
Glucose-Capillary: 95 mg/dL (ref 65–99)
Glucose-Capillary: 122 mg/dL — ABNORMAL HIGH (ref 65–99)
Glucose-Capillary: 119 mg/dL — ABNORMAL HIGH (ref 65–99)

## 2014-11-24 LAB — PROTIME-INR
INR: 1.73 — AB (ref 0.00–1.49)
PROTHROMBIN TIME: 20.3 s — AB (ref 11.6–15.2)

## 2014-11-24 LAB — CBC
HCT: 26.3 % — ABNORMAL LOW (ref 39.0–52.0)
Hemoglobin: 8.3 g/dL — ABNORMAL LOW (ref 13.0–17.0)
MCH: 30.9 pg (ref 26.0–34.0)
MCHC: 31.6 g/dL (ref 30.0–36.0)
MCV: 97.8 fL (ref 78.0–100.0)
Platelets: 57 10*3/uL — ABNORMAL LOW (ref 150–400)
RBC: 2.69 MIL/uL — AB (ref 4.22–5.81)
RDW: 17.9 % — ABNORMAL HIGH (ref 11.5–15.5)
WBC: 3.9 10*3/uL — ABNORMAL LOW (ref 4.0–10.5)

## 2014-11-24 LAB — BASIC METABOLIC PANEL
Anion gap: 8 (ref 5–15)
BUN: 17 mg/dL (ref 6–20)
CHLORIDE: 95 mmol/L — AB (ref 101–111)
CO2: 29 mmol/L (ref 22–32)
Calcium: 8.6 mg/dL — ABNORMAL LOW (ref 8.9–10.3)
Creatinine, Ser: 4.49 mg/dL — ABNORMAL HIGH (ref 0.61–1.24)
GFR calc Af Amer: 14 mL/min — ABNORMAL LOW (ref >60)
GFR calc non Af Amer: 12 mL/min — ABNORMAL LOW (ref >60)
Glucose, Bld: 88 mg/dL (ref 65–99)
POTASSIUM: 4.4 mmol/L (ref 3.5–5.1)
Sodium: 132 mmol/L — ABNORMAL LOW (ref 135–145)

## 2014-11-24 LAB — HEPARIN LEVEL (UNFRACTIONATED): Heparin Unfractionated: 0.41 IU/mL (ref 0.30–0.70)

## 2014-11-24 MED ORDER — VITAMIN K1 10 MG/ML IJ SOLN
1.0000 mg | Freq: Once | INTRAVENOUS | Status: AC
  Administered 2014-11-24: 1 mg via INTRAVENOUS
  Filled 2014-11-24: qty 0.1

## 2014-11-24 MED ORDER — CALCIUM ACETATE (PHOS BINDER) 667 MG PO CAPS
667.0000 mg | ORAL_CAPSULE | Freq: Two times a day (BID) | ORAL | Status: DC
  Administered 2014-11-24 – 2014-12-02 (×13): 667 mg via ORAL
  Filled 2014-11-24 (×18): qty 1

## 2014-11-24 NOTE — Progress Notes (Signed)
[facility] PROGRESS NOTE  PADEN SENGER ZHY:[id number] DOB: [birth year] DOA: 11/16/2014 PCP: [name], MD  69 year old man with significant past medical history for end-stage renal disease on hemodialysis Monday Wednesday Friday, status post DVT on Coumadin, hypertension, diabetes who presents today with severe weakness and found to have a hemoglobin 6.5. On 6/8 he was found to have a hemoglobin of 7.4 and was given 1 unit of PRBCs. His recent history is significant for a right thigh dialysis graft done in March of this year by Dr. Bridgett Larsson.He has already been seen by nephrology and 2 units of PRBCs have been ordered. Right groin ultrasound is done in the ED and shows a large hypoechoic structure consistent with a hematoma.  Plan for surgery on 6/27  Assessment/Plan: Symptomatic anemia -Status post 2 units of PRBCs -Patient has not been having melanotic or bright blood per rectum. FOBT 2 days prior to admission was negative. -Most likely source of patient's anemia is blood loss from growing hematoma due to dehiscence of his AVG. -Dr. Bridgett Larsson: s/p R groin hematoma evacuation, VAC placement -IV iron and Epogen therapy as per renal service discretion  hypoglycemia -d/c levemir -keep SSI  DVT -Has been on Coumadin since March 2016. -heparin gtt  -coumadin held  Heart transplant -Continue immunosuppressive medications.  End-stage renal disease on hemodialysis -Appreciate nephrology following. -will follow rec's and continue HD M-W-F  Hypertension -Stable and well control will continue home antihypertensive regimen -Patient on HD M-W-F which is also helping volume control  Type 2 diabetes -continue Sliding-scale insulin. -A1c 5.6  DVT prophylaxis -SCDs, currently coagulopathic -heparin gtt  Code Status: Full code Family Communication:  family at bedside Disposition Plan: surgery on Monday   Consultants:  Renal service  Vascular surgery  Procedures: 1. Right  groin hematoma evacuation 2. Placement of negative pressure dressing   Antibiotics:  Vancomycin 6/22  HPI/Subjective: Wants to go home after procedure- spent time educating about heparin bridge to coumadin for DVT  Objective: Filed Vitals:   11/24/14 0612  BP: 138/53  Pulse: 95  Temp: 97.8 F (36.6 C)  Resp: 18    Intake/Output Summary (Last 24 hours) at 11/24/14 1203 Last data filed at 11/24/14 2952  Gross per 24 hour  Intake    665 ml  Output   2500 ml  Net  -1835 ml   Filed Weights   11/21/14 1215 11/21/14 1630 11/23/14 0952  Weight: 74.5 kg (164 lb 3.9 oz) 72.5 kg (159 lb 13.3 oz) 74 kg (163 lb 2.3 oz)    Exam:  General:  A+Ox3, NAD rrr clear  Data Reviewed: Basic Metabolic Panel:  Recent Labs Lab 11/18/14 0620 11/20/14 0500 11/21/14 0550 11/23/14 1205 11/24/14 0408  NA 135 135 134* 134* 132*  K 3.9 4.3 4.5 3.6 4.4  CL 100* 100* 104 96* 95*  CO2 26 28 23 30 29   GLUCOSE 85 107* 81 130* 88  BUN 22* 23* 34* 12 17  CREATININE 6.30* 5.99* 7.69* 3.17* 4.49*  CALCIUM 8.6* 8.7* 9.2 8.0* 8.6*  PHOS 3.0 2.3*  --  1.5*  --    Liver Function Tests:  Recent Labs Lab 11/18/14 0620 11/20/14 0500 11/23/14 1205  ALBUMIN 2.4* 2.3* 2.6*   CBC:  Recent Labs Lab 11/21/14 0550 11/22/14 0720 11/23/14 0536 11/23/14 1205 11/24/14 0408  WBC 4.0 3.7* 3.8* 5.4 3.9*  HGB 8.5* 8.7* 7.5* 8.8* 8.3*  HCT 27.0* 28.3* 24.4* 28.2* 26.3*  MCV 97.5 95.6 97.2 94.6 97.8  PLT 91*  84* 69* 65* 57*   ProBNP (last 3 results)  Recent Labs  03/27/14 1747 04/12/14 0105  PROBNP 37853.0* 45045.0*   CBG:  Recent Labs Lab 11/23/14 1442 11/23/14 1640 11/23/14 2106 11/24/14 0755 11/24/14 1140  GLUCAP 83 163* 101* 83 95   Studies: No results found.  Scheduled Meds: . atorvastatin  40 mg Oral Daily  . azaTHIOprine  75 mg Oral q morning - 10a  . calcium acetate  667 mg Oral BID WC  . carvedilol  6.25 mg Oral QHS  . Chlorhexidine Gluconate Cloth  6 each Topical  Q0600  . colesevelam  3,750 mg Oral Daily  . darbepoetin (ARANESP) injection - DIALYSIS  200 mcg Intravenous Q Wed-HD  . doxercalciferol  4 mcg Intravenous Q M,W,F-HD  . famotidine  10 mg Oral QHS  . feeding supplement (PRO-STAT SUGAR FREE 64)  30 mL Oral BID  . feeding supplement (RESOURCE BREEZE)  1 Container Oral BID BM  . ferric gluconate (FERRLECIT/NULECIT) IV  125 mg Intravenous Q M,W,F-HD  . insulin aspart  0-5 Units Subcutaneous QHS  . insulin aspart  0-9 Units Subcutaneous TID WC  . insulin aspart  3 Units Subcutaneous TID WC  . isosorbide dinitrate  40 mg Oral 3 times per day  . multivitamin  1 tablet Oral QHS  . mupirocin ointment  1 application Nasal BID  . predniSONE  5 mg Oral Q breakfast  . sirolimus  1.5 mg Oral Daily  . vancomycin  750 mg Intravenous Q M,W,F-HD  . Warfarin - Pharmacist Dosing Inpatient   Does not apply q1800   Continuous Infusions: . sodium chloride 10 mL/hr at 11/21/14 2214  . heparin 1,100 Units/hr (11/24/14 1022)    Principal Problem:   Symptomatic anemia Active Problems:   DVT (deep venous thrombosis)   Coagulopathy   Heart transplanted   HTN (hypertension), malignant   DM (diabetes mellitus), type 2 with renal complications   ESRD (end stage renal disease) on dialysis    Time spent: 25 minutes   VANN, JESSICA  [facility] Pager [phone]. If 7PM-7AM, please contact night-coverage at [email or web address], password [facility] 11/24/2014, 12:03 PM  LOS: 6 days

## 2014-11-24 NOTE — Progress Notes (Signed)
   Daily Progress Note  POD #3 s/p R groin hematoma evacuation   INR 1.73 and climbing.  Hold Coumadin again.  Will given 1 mg Vit K and reschedule procedure for tomorrow.   Adele Barthel, MD [facility] [place] Office: [phone] Pager: [phone]  11/24/2014, 7:29 AM

## 2014-11-24 NOTE — Progress Notes (Signed)
ANTICOAGULATION CONSULT NOTE - Follow Up Consult  Pharmacy Consult for Heparin/coumadin Indication: h/o DVT  Allergies  Allergen Reactions  . Lisinopril Swelling    Lips and tongue swell  . Niacin And Related Other (See Comments)    unknown  . Norvasc [Amlodipine Besylate] Rash    Flushing  . Penicillins Rash    Patient Measurements: Height: 6' (182.9 cm) Weight: 163 lb 2.3 oz (74 kg) IBW/kg (Calculated) : 77.6 Heparin Dosing Weight: 73 kg  Vital Signs: Temp: 97.8 F (36.6 C) (06/30 0612) Temp Source: Oral (06/30 0612) BP: 138/53 mmHg (06/30 0612) Pulse Rate: 95 (06/30 0612)  Labs:  Recent Labs  11/22/14 0720 11/23/14 0536 11/23/14 1205 11/23/14 1627 11/24/14 0408  HGB 8.7* 7.5* 8.8*  --  8.3*  HCT 28.3* 24.4* 28.2*  --  26.3*  PLT 84* 69* 65*  --  57*  LABPROT 19.7* 19.0*  --   --  20.3*  INR 1.66* 1.59*  --   --  1.73*  HEPARINUNFRC 0.41 0.77*  --  0.33 0.41  CREATININE  --   --  3.17*  --  4.49*    Estimated Creatinine Clearance: 16.3 mL/min (by C-G formula based on Cr of 4.49).  Assessment: 69 y.o. on Coumadin PTA for hx of DVT(March 2016), s/p hematoma evacuation 6/27. Pt on heparin bridge to coumadin. Heparin level therapeutic (0.41) this morning on 1100 units/hr. INR 1.73, plan for abdominal aortogram tomorrow, hold coumadin again, also received IV vitamin K 1mg  this morning. Hgb low but stable. Thrombocytopenia at baseline, but Pltc down to 57K this morning. No bleeding noted.  Goal of Therapy:  INR 2-3; Heparin level 0.3-0.7 units/ml Monitor platelets by anticoagulation protocol: Yes   Plan:  Hold coumadin today F/u restarting coumadin after procedure tomorrow Continue heparin to 1100 units/hr Daily INR, heparin level and CBC   Maryanna Shape, PharmD, BCPS  Clinical Pharmacist  Pager: [phone]  11/24/2014,10:08 AM

## 2014-11-24 NOTE — Progress Notes (Signed)
Utilization Review completed. Obdulio Mash RN BSN CM 

## 2014-11-24 NOTE — Progress Notes (Signed)
[facility] Progress Note  Assessment/Plan: 1. AVG hematoma s/p evacuation+ wound VAC 6/27 For Aortogram, R leg angiogram, possible intervention tomorrow. 2. Anemia - due to AVG bleed / CKD, s/p transfusion. Hb 8.3 on max esa and Fe load 3. 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 Coumadin currently on hold. 4. ESRD - MWF HD.  5. Metabolic bone disease - Resumed Hectorol 4/ phos 2.3 - decreased phoslo to bid 6. 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 Volume RLE/ RUE edema:  Edema improved. For HD tomorrow.  8. HTN : BP controlled on current med regime/HD 9. 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 Dm type 2- per primary service 11. Malnutrition - alb 2.3 - renal diet/vits/supplements/nutrition consult 12. Thrombocytopenia   Rita H. Brown NP-C 11/24/2014, 12:15 PM  [facility] [phone]  Pt seen, examined and agree w A/P as above.  Kelly Splinter MD pager [phone]    cell [phone] 11/24/2014, 4:58 PM    Subjective:     Objective Filed Vitals:   11/23/14 1355 11/23/14 1445 11/23/14 2109 11/24/14 0612  BP: 125/55 136/68 130/54 138/53  Pulse: 93 91 93 95  Temp: 97.9 F (36.6 C) 97.7 F (36.5 C) 98.7 F (37.1 C) 97.8 F (36.6 C)  TempSrc: Oral Oral Oral Oral  Resp: 16 18 20 18   Height:      Weight:      SpO2:  95% 100% 100%   Physical Exam General: well nourished, pleasant male in NAD Heart: S1,S2, RRR No M/R/G Lungs: Bilateral breath sounds clear to ausculatation Abdomen: soft nontender with active BS X 4 Quad.  Extremities: No edema LE, 1+ pulses. Inactive LUA AVF still pulsatile. Dialysis Access: LIJ perm cath. R Thigh AVG not in use with wound vac present.   Dialysis Orders: [facility] MWF 4h 72kg Bath 2/2 Heparin 3000 Left IJ/ R fem AVG 5/26 Hectorol 4 Aranesp 200 q Wed no Fe  Additional Objective Labs: Basic Metabolic Panel:  Recent Labs Lab 11/18/14 0620  11/20/14 0500 11/21/14 0550 11/23/14 1205 11/24/14 0408  NA 135 135 134* 134* 132*  K 3.9 4.3 4.5 3.6 4.4  CL 100* 100* 104 96* 95*  CO2 26 28 23 30 29   GLUCOSE 85 107* 81 130* 88  BUN 22* 23* 34* 12 17  CREATININE 6.30* 5.99* 7.69* 3.17* 4.49*  CALCIUM 8.6* 8.7* 9.2 8.0* 8.6*  PHOS 3.0 2.3*  --  1.5*  --    Liver Function Tests:  Recent Labs Lab 11/18/14 0620 11/20/14 0500 11/23/14 1205  ALBUMIN 2.4* 2.3* 2.6*   No results for input(s): LIPASE, AMYLASE in the last 168 hours. CBC:  Recent Labs Lab 11/21/14 0550 11/22/14 0720 11/23/14 0536 11/23/14 1205 11/24/14 0408  WBC 4.0 3.7* 3.8* 5.4 3.9*  HGB 8.5* 8.7* 7.5* 8.8* 8.3*  HCT 27.0* 28.3* 24.4* 28.2* 26.3*  MCV 97.5 95.6 97.2 94.6 97.8  PLT 91* 84* 69* 65* 57*   Blood Culture    Component Value Date/Time   SDES WOUND GROIN RIGHT 11/21/2014 1007   SDES WOUND GROIN RIGHT 11/21/2014 1007   SPECREQUEST RIGHT GROIN HEMATOMA PT ON VANCOMYCIN 11/21/2014 1007   SPECREQUEST RIGHT GROIN HEMATOMA PT ON VANCOMYCIN 11/21/2014 1007   CULT  11/21/2014 1007    NO ANAEROBES ISOLATED; CULTURE IN PROGRESS FOR 5 DAYS Performed at [facility]  11/21/2014 1007    NO GROWTH 3 DAYS Performed at [facility]    REPTSTATUS PENDING 11/21/2014 1007   REPTSTATUS 11/24/2014 FINAL 11/21/2014 1007    Cardiac Enzymes: No results for input(s): CKTOTAL, CKMB, CKMBINDEX, TROPONINI in the last 168 hours. CBG:  Recent Labs Lab 11/23/14 1442 11/23/14 1640 11/23/14 2106 11/24/14 0755 11/24/14 1140  GLUCAP 83 163* 101* 83 95   Iron Studies: No results for input(s): IRON, TIBC, TRANSFERRIN, FERRITIN in the last 72 hours. @lablastinr3 @ Studies/Results: No results found. Medications: . sodium chloride 10 mL/hr at 11/21/14 2214  . heparin 1,100 Units/hr (11/24/14 1022)   . atorvastatin  40 mg Oral Daily  . azaTHIOprine  75 mg Oral q morning - 10a  . calcium acetate  667 mg Oral BID WC  . carvedilol   6.25 mg Oral QHS  . Chlorhexidine Gluconate Cloth  6 each Topical Q0600  . colesevelam  3,750 mg Oral Daily  . darbepoetin (ARANESP) injection - DIALYSIS  200 mcg Intravenous Q Wed-HD  . doxercalciferol  4 mcg Intravenous Q M,W,F-HD  . famotidine  10 mg Oral QHS  . feeding supplement (PRO-STAT SUGAR FREE 64)  30 mL Oral BID  . feeding supplement (RESOURCE BREEZE)  1 Container Oral BID BM  . ferric gluconate (FERRLECIT/NULECIT) IV  125 mg Intravenous Q M,W,F-HD  . insulin aspart  0-5 Units Subcutaneous QHS  . insulin aspart  0-9 Units Subcutaneous TID WC  . insulin aspart  3 Units Subcutaneous TID WC  . isosorbide dinitrate  40 mg Oral 3 times per day  . multivitamin  1 tablet Oral QHS  . mupirocin ointment  1 application Nasal BID  . predniSONE  5 mg Oral Q breakfast  . sirolimus  1.5 mg Oral Daily  . vancomycin  750 mg Intravenous Q M,W,F-HD  . Warfarin - Pharmacist Dosing Inpatient   Does not apply [phone]

## 2014-11-25 ENCOUNTER — Encounter: Payer: Medicare Other | Admitting: Vascular Surgery

## 2014-11-25 ENCOUNTER — Encounter (HOSPITAL_COMMUNITY)
Admission: EM | Disposition: A | Discharge status: Rehabilitation Facility | Payer: Medicare Other | Source: Home / Self Care | Attending: Internal Medicine

## 2014-11-25 DIAGNOSIS — I70221 Atherosclerosis of native arteries of extremities with rest pain, right leg: Secondary | ICD-10-CM

## 2014-11-25 HISTORY — PX: PERIPHERAL VASCULAR CATHETERIZATION: SHX172C

## 2014-11-25 HISTORY — PX: LOWER EXTREMITY ANGIOGRAM: SHX5508

## 2014-11-25 LAB — RENAL FUNCTION PANEL
ALBUMIN: 2.6 g/dL — AB (ref 3.5–5.0)
Anion gap: 9 (ref 5–15)
BUN: 19 mg/dL (ref 6–20)
CALCIUM: 8.4 mg/dL — AB (ref 8.9–10.3)
CO2: 27 mmol/L (ref 22–32)
CREATININE: 4.35 mg/dL — AB (ref 0.61–1.24)
Chloride: 96 mmol/L — ABNORMAL LOW (ref 101–111)
GFR calc Af Amer: 15 mL/min — ABNORMAL LOW (ref >60)
GFR, EST NON AFRICAN AMERICAN: 13 mL/min — AB (ref >60)
GLUCOSE: 115 mg/dL — AB (ref 65–99)
Phosphorus: 2.3 mg/dL — ABNORMAL LOW (ref 2.5–4.6)
Potassium: 4.1 mmol/L (ref 3.5–5.1)
Sodium: 132 mmol/L — ABNORMAL LOW (ref 135–145)

## 2014-11-25 LAB — PROTIME-INR
INR: 3.91 — ABNORMAL HIGH (ref 0.00–1.49)
INR: 1.22 (ref 0.00–1.49)
PROTHROMBIN TIME: 37.3 s — AB (ref 11.6–15.2)
PROTHROMBIN TIME: 15.6 s — AB (ref 11.6–15.2)

## 2014-11-25 LAB — CBC
HEMATOCRIT: 25.6 % — AB (ref 39.0–52.0)
HEMOGLOBIN: 8 g/dL — AB (ref 13.0–17.0)
MCH: 30.1 pg (ref 26.0–34.0)
MCHC: 31.3 g/dL (ref 30.0–36.0)
MCV: 96.2 fL (ref 78.0–100.0)
Platelets: 85 10*3/uL — ABNORMAL LOW (ref 150–400)
RBC: 2.66 MIL/uL — AB (ref 4.22–5.81)
RDW: 17.9 % — ABNORMAL HIGH (ref 11.5–15.5)
WBC: 5.1 10*3/uL (ref 4.0–10.5)

## 2014-11-25 LAB — POCT ACTIVATED CLOTTING TIME
ACTIVATED CLOTTING TIME: 257 s
ACTIVATED CLOTTING TIME: 184 s
ACTIVATED CLOTTING TIME: 196 s
Activated Clotting Time: 239 seconds

## 2014-11-25 LAB — GLUCOSE, CAPILLARY
GLUCOSE-CAPILLARY: 129 mg/dL — AB (ref 65–99)
Glucose-Capillary: 92 mg/dL (ref 65–99)
Glucose-Capillary: 123 mg/dL — ABNORMAL HIGH (ref 65–99)

## 2014-11-25 LAB — HEPARIN LEVEL (UNFRACTIONATED): Heparin Unfractionated: 0.54 IU/mL (ref 0.30–0.70)

## 2014-11-25 SURGERY — ABDOMINAL AORTOGRAM
Laterality: Right

## 2014-11-25 MED ORDER — LIDOCAINE HCL (PF) 1 % IJ SOLN
INTRAMUSCULAR | Status: AC
  Filled 2014-11-25: qty 30

## 2014-11-25 MED ORDER — LABETALOL HCL 5 MG/ML IV SOLN
10.0000 mg | INTRAVENOUS | Status: DC | PRN
  Filled 2014-11-25: qty 4

## 2014-11-25 MED ORDER — HYDRALAZINE HCL 20 MG/ML IJ SOLN: 5.0000 mg | INTRAMUSCULAR | Status: DC | PRN

## 2014-11-25 MED ORDER — HEPARIN SODIUM (PORCINE) 1000 UNIT/ML IJ SOLN
INTRAMUSCULAR | Status: AC
  Filled 2014-11-25: qty 1

## 2014-11-25 MED ORDER — ACETAMINOPHEN 325 MG PO TABS
325.0000 mg | ORAL_TABLET | ORAL | Status: DC | PRN
  Administered 2014-11-27: 650 mg via ORAL
  Filled 2014-11-25 (×2): qty 2

## 2014-11-25 MED ORDER — DOXERCALCIFEROL 4 MCG/2ML IV SOLN
INTRAVENOUS | Status: AC
  Administered 2014-11-25: 4 ug
  Filled 2014-11-25: qty 2

## 2014-11-25 MED ORDER — PENTAFLUOROPROP-TETRAFLUOROETH EX AERO: 1.0000 "application " | INHALATION_SPRAY | CUTANEOUS | Status: DC | PRN

## 2014-11-25 MED ORDER — SODIUM CHLORIDE 0.9 % IV SOLN: 100.0000 mL | INTRAVENOUS | Status: DC | PRN

## 2014-11-25 MED ORDER — NEPRO/CARBSTEADY PO LIQD
237.0000 mL | ORAL | Status: DC | PRN
  Filled 2014-11-25: qty 237

## 2014-11-25 MED ORDER — ALTEPLASE 2 MG IJ SOLR
2.0000 mg | Freq: Once | INTRAMUSCULAR | Status: DC | PRN
  Filled 2014-11-25: qty 2

## 2014-11-25 MED ORDER — HEPARIN (PORCINE) IN NACL 2-0.9 UNIT/ML-% IJ SOLN
INTRAMUSCULAR | Status: AC
  Filled 2014-11-25: qty 1000

## 2014-11-25 MED ORDER — LIDOCAINE HCL (PF) 1 % IJ SOLN: 5.0000 mL | INTRAMUSCULAR | Status: DC | PRN

## 2014-11-25 MED ORDER — METOPROLOL TARTRATE 1 MG/ML IV SOLN: 2.0000 mg | INTRAVENOUS | Status: DC | PRN

## 2014-11-25 MED ORDER — DARBEPOETIN ALFA 200 MCG/0.4ML IJ SOSY
PREFILLED_SYRINGE | INTRAMUSCULAR | Status: AC
  Filled 2014-11-25: qty 0.4

## 2014-11-25 MED ORDER — IODIXANOL 320 MG/ML IV SOLN
INTRAVENOUS | Status: DC | PRN
  Administered 2014-11-25: 215 mL via INTRAVENOUS

## 2014-11-25 MED ORDER — HEPARIN (PORCINE) IN NACL 100-0.45 UNIT/ML-% IJ SOLN
1150.0000 [IU]/h | INTRAMUSCULAR | Status: DC
  Administered 2014-11-26: 1100 [IU]/h via INTRAVENOUS
  Administered 2014-11-26 – 2014-11-28 (×3): 1200 [IU]/h via INTRAVENOUS
  Administered 2014-11-30 – 2014-12-02 (×3): 1150 [IU]/h via INTRAVENOUS
  Filled 2014-11-25 (×12): qty 250

## 2014-11-25 MED ORDER — ACETAMINOPHEN 650 MG RE SUPP: 325.0000 mg | RECTAL | Status: DC | PRN

## 2014-11-25 MED ORDER — DOCUSATE SODIUM 100 MG PO CAPS
100.0000 mg | ORAL_CAPSULE | Freq: Every day | ORAL | Status: DC
Start: 2014-11-26 — End: 2014-12-02
  Administered 2014-11-26 – 2014-12-02 (×7): 100 mg via ORAL
  Filled 2014-11-25 (×7): qty 1

## 2014-11-25 MED ORDER — LIDOCAINE-PRILOCAINE 2.5-2.5 % EX CREA
1.0000 "application " | TOPICAL_CREAM | CUTANEOUS | Status: DC | PRN
  Filled 2014-11-25: qty 5

## 2014-11-25 MED ORDER — PANTOPRAZOLE SODIUM 40 MG PO TBEC
40.0000 mg | DELAYED_RELEASE_TABLET | Freq: Every day | ORAL | Status: DC
  Administered 2014-11-25 – 2014-12-02 (×8): 40 mg via ORAL
  Filled 2014-11-25 (×8): qty 1

## 2014-11-25 MED ORDER — ONDANSETRON HCL 4 MG/2ML IJ SOLN: 4.0000 mg | Freq: Four times a day (QID) | INTRAMUSCULAR | Status: DC | PRN

## 2014-11-25 MED ORDER — HEPARIN SODIUM (PORCINE) 1000 UNIT/ML IJ SOLN
INTRAMUSCULAR | Status: DC | PRN
  Administered 2014-11-25: 5000 [IU] via INTRAVENOUS

## 2014-11-25 MED ORDER — LIDOCAINE HCL (PF) 1 % IJ SOLN
INTRAMUSCULAR | Status: DC | PRN
  Administered 2014-11-25: 15 mL

## 2014-11-25 SURGICAL SUPPLY — 22 items
CATH ANGIO 5F PIGTAIL 65CM (CATHETERS) ×1 IMPLANT
CATH CROSS OVER TEMPO 5F (CATHETERS) ×1 IMPLANT
CATH QUICKCROSS .035X135CM (MICROCATHETER) ×1 IMPLANT
CATH SOFT-VU ST 4F 90CM (CATHETERS) ×1 IMPLANT
CATH STRAIGHT 5FR 65CM (CATHETERS) ×1 IMPLANT
CATH TEMPO AQUA 5F 100CM (CATHETERS) ×1 IMPLANT
DEVICE CONTINUOUS FLUSH (MISCELLANEOUS) ×1 IMPLANT
GUIDEWIRE ANGLED .035X150CM (WIRE) ×1 IMPLANT
GUIDEWIRE ANGLED .035X260CM (WIRE) ×1 IMPLANT
HAND CONTROLLER AVANTA (MISCELLANEOUS) IMPLANT
KIT PV (KITS) ×3 IMPLANT
SET AVANTA MULTI PATIENT (MISCELLANEOUS) IMPLANT
SET AVANTA SINGLE PATIENT (MISCELLANEOUS) IMPLANT
SHEATH AVANTA HAND CONTROLLER (MISCELLANEOUS) IMPLANT
SHEATH PINNACLE 5F 10CM (SHEATH) ×1 IMPLANT
SHEATH PINNACLE MP 7F 45CM (SHEATH) ×1 IMPLANT
SYR MEDRAD MARK V 150ML (SYRINGE) IMPLANT
TAPE RADIOPAQUE TURBO (MISCELLANEOUS) ×1 IMPLANT
TRANSDUCER W/STOPCOCK (MISCELLANEOUS) ×3 IMPLANT
TRAY PV CATH (CUSTOM PROCEDURE TRAY) ×3 IMPLANT
WIRE HITORQ VERSACORE ST 145CM (WIRE) ×1 IMPLANT
WIRE ROSEN-J .035X180CM (WIRE) ×1 IMPLANT

## 2014-11-25 NOTE — Op Note (Signed)
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 The patient was placed in supine position on the Angio table. Both groins were prepped and draped in usual sterile fashion. Local anesthesia was demonstrated over the left common femoral artery. Ultrasound was used to identify the left common femoral artery. An introducer needle was used to cannulate the left common femoral artery without difficulty after infiltration of local anesthesia. An 035 versacore wire was then threaded up the abdominal aorta under fluoroscopic guidance. There was dense calcification of all vessels. A 5 French sheath was then placed over the guidewire and the left common femoral artery. This was thoroughly flushed with heparinized saline. A 5 French pigtail catheter was placed over the guidewire and advanced into the abdominal aorta. Abdominal aortogram was obtained in AP projection. The infrarenal abdominal aorta is heavily calcified but patent. The left and right common internal and external iliac arteries are widely patent but again heavily calcified. There is shunting of blood from the arterial to the venous system through a right thigh AV graft. Next the pectoral catheter was pulled out just above the aortic bifurcation and an AP and  bilateral oblique views of the pelvis were obtained to further define the iliac arteries as well as the proximal and distal anastomosis of the right thigh AV graft. The iliac system is calcified but widely patent bilaterally. The right thigh AV graft has a proximal anastomosis to the right common femoral artery. Istalol anastomosis to the saphenofemoral junction. These are both widely patent.  At this point the apical catheter was removed over guidewire and a 5 [place] crossover catheter was used to select of the catheters the right common iliac artery. An 035 angled Glidewire was then advanced down into the right proximal superficial femoral artery. I initially tried to advance a 4 [place] straight catheter of this but this would not pass easily due to calcification. The 4 French catheter was removed and exchanged for a 5 French straight catheter. This was then advanced down into the proximal right superficial femoral artery. The Glidewire was then exchanged for an [place] wire. The 5 French straight catheter and the 5 French sheath was then removed from the left femoral artery and replaced with a 7 [place] Terumo sheath. This was then advanced down into the right superficial femoral artery. Lower extremity runoff views were obtained of the right lower extremity. The right superficial femoral artery has several areas of atherosclerosis and occludes just before the adductor hiatus. The occlusion is approximate 7 cm in length. The below-knee popliteal artery does reconstitute. It gives off runoff vessels via the peroneal and posterior tibial arteries. Hernial artery is the dominant runoff vessel to the foot. The posterior tibial artery is small diffusely diseased but is patent all the way to level the foot. The anterior tibial artery is occluded. Patient was given 5000 units of heparin at this point. An 035 angled Glidewire was brought back up on the  operative field as well as a quick cross catheter. These were  advanced as a unit down the superficial femoral artery. I refluxed the Glidewire upon itself and then advanced a quick cross catheter over this. I made several attempts at subintimal angioplasty of the SFA and I was able to get return of blood flow but never truly reenter the lumen at the above-knee popliteal segment. After attempting several times I abandoned this procedure. A repeat runoff view was obtained which showed the below-knee popliteal artery was still patent with similar runoff. This point the sheath was pulled back over the guidewire across catheter was removed the sheath was left in place in the distal left external iliac artery. This sheath was thoroughly flushed with heparinized saline.  The patient tolerated the procedure well and there were no consultations. Patient was taken to the holding area in stable condition.  Operative management: The patient has a chronic right superficial femoral artery occlusion with reconstitution of the below-knee popliteal artery. Options would include an additional attempts at recanalization angioplasty and stenting. Other options would include revascularization with right femoral to below-knee popliteal bypass. I have discussed but these options with Dr. Bridgett Larsson who will determine which course of action would be best for the patient.  Ruta Hinds, MD [facility] Office: [phone] Pager: [phone]

## 2014-11-25 NOTE — H&P (View-Only) (Signed)
   Daily Progress Note  POD #3 s/p R groin hematoma evacuation   INR 1.73 and climbing.  Hold Coumadin again.  Will given 1 mg Vit K and reschedule procedure for tomorrow.   Adele Barthel, MD [facility] Office: [phone] Pager: [phone]  11/24/2014, 7:29 AM

## 2014-11-25 NOTE — Progress Notes (Signed)
Site area: left groin a 7 french arterial sheath was removed  Site Prior to Removal:  Level 0  Pressure Applied For 15 MINUTES    Minutes Beginning at 1450p  Manual:   Yes.    Patient Status During Pull:  stable  Post Pull Groin Site:  Level 0  Post Pull Instructions Given:  Yes.    Post Pull Pulses Present:  Yes.    Dressing Applied:  Yes.    Comments:  VS remain stable during sheath pull.  Pt denis any discomfort at this time at site

## 2014-11-25 NOTE — Progress Notes (Signed)
Subjective:  No cos,for Angiogram  this am eval R lower etrm. / for  HD   Objective Vital signs in last 24 hours: Filed Vitals:   11/24/14 0612 11/24/14 1346 11/24/14 2125 11/25/14 0526  BP: 138/53 134/53 143/51 150/58  Pulse: 95 89 84 82  Temp: 97.8 F (36.6 C) 98.7 F (37.1 C) 98.5 F (36.9 C) 98.4 F (36.9 C)  TempSrc: Oral Oral Oral Oral  Resp: 18 18 18 18   Height:      Weight:      SpO2: 100% 100% 98% 98%   Weight change:   Physical Exam: General: Alert OX3 pleasant NAD Heart: RRR , no mur , rub or gallop Lungs:CTA bilat Abdomen: soft NT, ND Extremities: right LE tr edema, left LE1+ edema; bil;at  Upper extre  Edema chronic Dialysis Access: left IJ perm cath and right thigh AVGG + bruit, moist groin wound OP     OP HD= [facility] MWF 4h 72kg Bath 2/2 Heparin 3000 Left IJ/ R fem AVG 5/26 Hectorol 4 Aranesp 200 q Wed no Fe  Problem/Plan 1. AVG hematoma/ Dehisced AVG wound-s/p evacuation+ wound VAC 6/27 Dr. Bridgett Larsson / Today For  R leg angiogram, possible and intervention/ on Vancomycin  2. Anemia - multiple etiologies- due to AVG bleed / CKD Anemia/ and immunosuppression meds- s/p transfusion. Hb 8.3  Fairly stable on max esa and Fe load 3. 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 Coumadin currently on hold. 4. ESRD - MWF HD. HD today back on schedule after angiogram  5. Metabolic bone disease - ON  Hectorol 4/  corec ca 9.7 phos 2.3 >1.3- stop  phoslo  For now 6. Volume/ HTN=: BP stable with /HDand  low dose coreg/  Edema improved. For HD today 7. 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 Dm type 2- per primary service 9. Malnutrition - alb 2.3>2.6 - renal diet/vits/supplements/nutrition consult 10. Thrombocytopenia- on immunosuppressive meds    Ernest Haber, PA-C [place] [phone] 11/25/2014,8:07 AM  LOS: 7 days   Pt seen, examined and agree w A/P as above.  Kelly Splinter MD pager  [phone]    cell [phone] 11/25/2014, 11:25 AM    Labs: Basic Metabolic Panel:  Recent Labs Lab 11/20/14 0500 11/21/14 0550 11/23/14 1205 11/24/14 0408  NA 135 134* 134* 132*  K 4.3 4.5 3.6 4.4  CL 100* 104 96* 95*  CO2 28 23 30 29   GLUCOSE 107* 81 130* 88  BUN 23* 34* 12 17  CREATININE 5.99* 7.69* 3.17* 4.49*  CALCIUM 8.7* 9.2 8.0* 8.6*  PHOS 2.3*  --  1.5*  --    Liver Function Tests:  Recent Labs Lab 11/20/14 0500 11/23/14 1205  ALBUMIN 2.3* 2.6*     Recent Labs Lab 11/21/14 0550 11/22/14 0720 11/23/14 0536 11/23/14 1205 11/24/14 0408  WBC 4.0 3.7* 3.8* 5.4 3.9*  HGB 8.5* 8.7* 7.5* 8.8* 8.3*  HCT 27.0* 28.3* 24.4* 28.2* 26.3*  MCV 97.5 95.6 97.2 94.6 97.8  PLT 91* 84* 69* 65* 57*  CBG:  Recent Labs Lab 11/24/14 0755 11/24/14 1140 11/24/14 1630 11/24/14 2122 11/25/14 0759  GLUCAP 83 95 122* 119* 92    Studies/Results: No results found. Medications: . sodium chloride 10 mL/hr at 11/25/14 0044  . heparin 1,100 Units/hr (11/25/14 0044)   . atorvastatin  40 mg Oral Daily  . azaTHIOprine  75 mg Oral q morning - 10a  . calcium acetate  667 mg Oral BID WC  . carvedilol  6.25 mg Oral QHS  . Chlorhexidine Gluconate Cloth  6 each Topical Q0600  . colesevelam  3,750 mg Oral Daily  . darbepoetin (ARANESP) injection - DIALYSIS  200 mcg Intravenous Q Wed-HD  . doxercalciferol  4 mcg Intravenous Q M,W,F-HD  . famotidine  10 mg Oral QHS  . feeding supplement (PRO-STAT SUGAR FREE 64)  30 mL Oral BID  . feeding supplement (RESOURCE BREEZE)  1 Container Oral BID BM  . ferric gluconate (FERRLECIT/NULECIT) IV  125 mg Intravenous Q M,W,F-HD  . insulin aspart  0-5 Units Subcutaneous QHS  . insulin aspart  0-9 Units Subcutaneous TID WC  . insulin aspart  3 Units Subcutaneous TID WC  . isosorbide dinitrate  40 mg Oral 3 times per day  . multivitamin  1 tablet Oral QHS  . mupirocin ointment  1 application Nasal BID  . predniSONE  5 mg Oral Q breakfast  .  sirolimus  1.5 mg Oral Daily  . vancomycin  750 mg Intravenous Q M,W,F-HD  . Warfarin - Pharmacist Dosing Inpatient   Does not apply [phone]

## 2014-11-25 NOTE — Interval H&P Note (Signed)
History and Physical Interval Note:  11/25/2014 10:02 AM  Brandon Sharp  has presented today for surgery, with the diagnosis of possible steal sen of left leg  The various methods of treatment have been discussed with the patient and family. After consideration of risks, benefits and other options for treatment, the patient has consented to  Procedure(s): Abdominal Aortogram (N/A) as a surgical intervention .  The patient's history has been reviewed, patient examined, no change in status, stable for surgery.  I have reviewed the patient's chart and labs.  Questions were answered to the patient's satisfaction.     Ruta Hinds

## 2014-11-25 NOTE — Progress Notes (Signed)
Pt. Being transferred to 2W15 after aortagram and HD. Called report to receiving nurse. All belongings packed up and to transfer to pt. New room. No further needs noted at this time.

## 2014-11-25 NOTE — Progress Notes (Signed)
ANTICOAGULATION CONSULT NOTE - Follow Up Consult  Pharmacy Consult for Heparin, Coumadin on hold Indication: h/o DVT  Allergies  Allergen Reactions  . Lisinopril Swelling    Lips and tongue swell  . Niacin And Related Other (See Comments)    unknown  . Norvasc [Amlodipine Besylate] Rash    Flushing  . Penicillins Rash    Patient Measurements: Height: 6' (182.9 cm) Weight: 163 lb 2.3 oz (74 kg) IBW/kg (Calculated) : 77.6 Heparin Dosing Weight: 73 kg  Vital Signs: Temp: 98.4 F (36.9 C) (07/01 0526) Temp Source: Oral (07/01 0526) BP: 158/55 mmHg (07/01 1510) Pulse Rate: 80 (07/01 1510)  Labs:  Recent Labs  11/23/14 0536 11/23/14 1205 11/23/14 1627 11/24/14 0408 11/25/14 0430 11/25/14 0705  HGB 7.5* 8.8*  --  8.3*  --   --   HCT 24.4* 28.2*  --  26.3*  --   --   PLT 69* 65*  --  57*  --   --   LABPROT 19.0*  --   --  20.3* 37.3* 15.6*  INR 1.59*  --   --  1.73* 3.91* 1.22  HEPARINUNFRC 0.77*  --  0.33 0.41 0.54  --   CREATININE  --  3.17*  --  4.49*  --   --     Estimated Creatinine Clearance: 16.3 mL/min (by C-G formula based on Cr of 4.49).  Assessment: 69 y.o. on Coumadin PTA for hx of DVT(March 2016), s/p hematoma evacuation 6/27. Pt on heparin bridge to coumadin. Heparin level therapeutic (0.41) this morning on 1100 units/hr. INR 1.73, plan for abdominal aortogram tomorrow, hold coumadin again, also received IV vitamin K 1mg  this morning. Hgb low but stable. Thrombocytopenia at baseline, but Pltc down to 57K this morning. No bleeding noted.  7/1 - now s/p aortogram, spoke to Dr. Bridgett Larsson, okay to resume IV heparin 6 hrs after procedure. He does not want [place] to resume Coumadin yet b/c plan for patient not known yet. Will have vein mapping in next few days to help determine plan.  Goal of Therapy:  Heparin level 0.3 - 0.7 units/ml Monitor platelets by anticoagulation protocol: Yes   Plan:  Continue to hold Coumadin. Resume heparin 6 hrs after procedure at a  rate of 1100 units/hr. Check heparin level 6 hrs after gtt restarted. Daily INR, heparin level and CBC.  Uvaldo Rising, BCPS  Clinical Pharmacist Pager [phone]  11/25/2014 4:35 PM

## 2014-11-25 NOTE — Progress Notes (Signed)
[facility] PROGRESS NOTE  LEGEND TUMMINELLO UMP:[id number] DOB: 1946-02-05 DOA: 11/16/2014 PCP: [name], MD  69 year old man with significant past medical history for end-stage renal disease on hemodialysis Monday Wednesday Friday, status post DVT on Coumadin, hypertension, diabetes who presents today with severe weakness and found to have a hemoglobin 6.5. On 6/8 he was found to have a hemoglobin of 7.4 and was given 1 unit of PRBCs. His recent history is significant for a right thigh dialysis graft done in March of this year by Dr. Bridgett Larsson.He has already been seen by nephrology and 2 units of PRBCs have been ordered. Right groin ultrasound is done in the ED and shows a large hypoechoic structure consistent with a hematoma.  S/p surgery with wound vac placement.  Patient also went for angiogram  Assessment/Plan: Symptomatic anemia -Status post 2 units of PRBCs -Patient has not been having melanotic or bright blood per rectum. FOBT 2 days prior to admission was negative. -Most likely source of patient's anemia is blood loss from growing hematoma due to dehiscence of his AVG. -Dr. Bridgett Larsson: s/p R groin hematoma evacuation, VAC placement -IV iron and Epogen therapy as per renal service discretion  PAD: Angiogram: chronic right superficial femoral artery occlusion with reconstitution of the below-knee popliteal artery. Options would include an additional attempts at recanalization angioplasty and stenting. Other options would include revascularization with right femoral to below-knee popliteal bypass -vascular consult  hypoglycemia -d/c levemir -keep SSI  DVT -Has been on Coumadin since March 2016. -heparin gtt  -coumadin held while vascular evaluated  Heart transplant -Continue immunosuppressive medications.  End-stage renal disease on hemodialysis -Appreciate nephrology following. -will follow rec's and continue HD M-W-F  Hypertension -Stable and well control will continue  home antihypertensive regimen -Patient on HD M-W-F which is also helping volume control  Type 2 diabetes -continue Sliding-scale insulin. -A1c 5.6  DVT prophylaxis -SCDs, currently coagulopathic -heparin gtt  Code Status: Full code Family Communication:  family at bedside Disposition Plan: surgery on Monday   Consultants:  Renal service  Vascular surgery  Procedures: 1. Right groin hematoma evacuation 2. Placement of negative pressure dressing   Antibiotics:  Vancomycin 6/22  HPI/Subjective: In procedure  Objective: Filed Vitals:   11/25/14 1223  BP: 144/50  Pulse:   Temp:   Resp:     Intake/Output Summary (Last 24 hours) at 11/25/14 1355 Last data filed at 11/25/14 0918  Gross per 24 hour  Intake 475.83 ml  Output      0 ml  Net 475.83 ml   Filed Weights   11/21/14 1215 11/21/14 1630 11/23/14 0952  Weight: 74.5 kg (164 lb 3.9 oz) 72.5 kg (159 lb 13.3 oz) 74 kg (163 lb 2.3 oz)    Exam:   Data Reviewed: Basic Metabolic Panel:  Recent Labs Lab 11/20/14 0500 11/21/14 0550 11/23/14 1205 11/24/14 0408  NA 135 134* 134* 132*  K 4.3 4.5 3.6 4.4  CL 100* 104 96* 95*  CO2 28 23 30 29   GLUCOSE 107* 81 130* 88  BUN 23* 34* 12 17  CREATININE 5.99* 7.69* 3.17* 4.49*  CALCIUM 8.7* 9.2 8.0* 8.6*  PHOS 2.3*  --  1.5*  --    Liver Function Tests:  Recent Labs Lab 11/20/14 0500 11/23/14 1205  ALBUMIN 2.3* 2.6*   CBC:  Recent Labs Lab 11/21/14 0550 11/22/14 0720 11/23/14 0536 11/23/14 1205 11/24/14 0408  WBC 4.0 3.7* 3.8* 5.4 3.9*  HGB 8.5* 8.7* 7.5* 8.8* 8.3*  HCT 27.0* 28.3*  24.4* 28.2* 26.3*  MCV 97.5 95.6 97.2 94.6 97.8  PLT 91* 84* 69* 65* 57*   ProBNP (last 3 results)  Recent Labs  03/27/14 1747 04/12/14 0105  PROBNP 37853.0* 45045.0*   CBG:  Recent Labs Lab 11/24/14 1140 11/24/14 1630 11/24/14 2122 11/25/14 0759 11/25/14 1239  GLUCAP 95 122* 119* 92 129*   Studies: No results found.  Scheduled Meds: . [MAR  Hold] atorvastatin  40 mg Oral Daily  . [MAR Hold] azaTHIOprine  75 mg Oral q morning - 10a  . [MAR Hold] calcium acetate  667 mg Oral BID WC  . [MAR Hold] carvedilol  6.25 mg Oral QHS  . [MAR Hold] Chlorhexidine Gluconate Cloth  6 each Topical Q0600  . [MAR Hold] colesevelam  3,750 mg Oral Daily  . [MAR Hold] darbepoetin (ARANESP) injection - DIALYSIS  200 mcg Intravenous Q Wed-HD  . [MAR Hold] doxercalciferol  4 mcg Intravenous Q M,W,F-HD  . [MAR Hold] famotidine  10 mg Oral QHS  . [MAR Hold] feeding supplement (PRO-STAT SUGAR FREE 64)  30 mL Oral BID  . [MAR Hold] feeding supplement (RESOURCE BREEZE)  1 Container Oral BID BM  . [MAR Hold] ferric gluconate (FERRLECIT/NULECIT) IV  125 mg Intravenous Q M,W,F-HD  . [MAR Hold] insulin aspart  0-5 Units Subcutaneous QHS  . [MAR Hold] insulin aspart  0-9 Units Subcutaneous TID WC  . [MAR Hold] insulin aspart  3 Units Subcutaneous TID WC  . [MAR Hold] isosorbide dinitrate  40 mg Oral 3 times per day  . [MAR Hold] multivitamin  1 tablet Oral QHS  . [MAR Hold] mupirocin ointment  1 application Nasal BID  . [MAR Hold] predniSONE  5 mg Oral Q breakfast  . [MAR Hold] sirolimus  1.5 mg Oral Daily  . [MAR Hold] vancomycin  750 mg Intravenous Q M,W,F-HD  . [MAR Hold] Warfarin - Pharmacist Dosing Inpatient   Does not apply q1800   Continuous Infusions: . sodium chloride 10 mL/hr at 11/25/14 0044  . heparin 1,100 Units/hr (11/25/14 0044)    Principal Problem:   Symptomatic anemia Active Problems:   DVT (deep venous thrombosis)   Coagulopathy   Heart transplanted   HTN (hypertension), malignant   DM (diabetes mellitus), type 2 with renal complications   ESRD (end stage renal disease) on dialysis    Time spent: 15 minutes   Lam Bjorklund  [facility] Pager [phone]. If 7PM-7AM, please contact night-coverage at [email or web address], password [email or web address] 11/25/2014, 1:55 PM  LOS: 7 days

## 2014-11-25 NOTE — Progress Notes (Addendum)
   Daily Progress Note  RLE angiogram demonstrates medium length occlusion.  Options include: 1.  Ligation of thigh AVG and observation 2.  Re-attempt at endovascular revascularization including retrograde cannulation at level of below-the-knee popliteal artery 3.  R CFA to AK vs BK bypass in setting of recent right groin surgery  - will get vein mapping to see if vein conduit available for bypass option if not might try re-attempt endovascular revascularization prior to considering ligation of thigh AVG - I am not back until Wednesday with my angio day on Thursday, so at earliest, I can get the patient back in to angio suite next Thursday - if pt remains asx, rest of work-up can be completed as an outpatient. - if pt is sx, will need to expedite work-up in-patient   Adele Barthel, MD [facility] Office: [phone] Pager: [phone]  11/25/2014, 2:23 PM

## 2014-11-25 NOTE — Care Management (Signed)
Important Message  Patient Details  Name: Brandon Sharp MRN: [id number] Date of Birth: 03/06/1946   Medicare Important Message Given:  Yes-second notification given    Carles Collet, RN 11/25/2014, 7:56 AM

## 2014-11-26 ENCOUNTER — Inpatient Hospital Stay (HOSPITAL_COMMUNITY): Payer: Medicare Other

## 2014-11-26 DIAGNOSIS — I739 Peripheral vascular disease, unspecified: Secondary | ICD-10-CM

## 2014-11-26 LAB — BASIC METABOLIC PANEL
ANION GAP: 8 (ref 5–15)
BUN: 22 mg/dL — ABNORMAL HIGH (ref 6–20)
CO2: 27 mmol/L (ref 22–32)
CREATININE: 5.35 mg/dL — AB (ref 0.61–1.24)
Calcium: 8.8 mg/dL — ABNORMAL LOW (ref 8.9–10.3)
Chloride: 99 mmol/L — ABNORMAL LOW (ref 101–111)
GFR calc Af Amer: 11 mL/min — ABNORMAL LOW (ref >60)
GFR, EST NON AFRICAN AMERICAN: 10 mL/min — AB (ref >60)
Glucose, Bld: 94 mg/dL (ref 65–99)
POTASSIUM: 4 mmol/L (ref 3.5–5.1)
Sodium: 134 mmol/L — ABNORMAL LOW (ref 135–145)

## 2014-11-26 LAB — PROTIME-INR
INR: 1.31 (ref 0.00–1.49)
Prothrombin Time: 16.4 seconds — ABNORMAL HIGH (ref 11.6–15.2)

## 2014-11-26 LAB — CBC
HCT: 25 % — ABNORMAL LOW (ref 39.0–52.0)
Hemoglobin: 7.7 g/dL — ABNORMAL LOW (ref 13.0–17.0)
MCH: 29.7 pg (ref 26.0–34.0)
MCHC: 30.8 g/dL (ref 30.0–36.0)
MCV: 96.5 fL (ref 78.0–100.0)
PLATELETS: 90 10*3/uL — AB (ref 150–400)
RBC: 2.59 MIL/uL — ABNORMAL LOW (ref 4.22–5.81)
RDW: 18.1 % — ABNORMAL HIGH (ref 11.5–15.5)
WBC: 4.5 10*3/uL (ref 4.0–10.5)

## 2014-11-26 LAB — ANAEROBIC CULTURE: Gram Stain: NONE SEEN

## 2014-11-26 LAB — GLUCOSE, CAPILLARY
GLUCOSE-CAPILLARY: 99 mg/dL (ref 65–99)
Glucose-Capillary: 97 mg/dL (ref 65–99)
Glucose-Capillary: 103 mg/dL — ABNORMAL HIGH (ref 65–99)

## 2014-11-26 LAB — HEPARIN LEVEL (UNFRACTIONATED)
HEPARIN UNFRACTIONATED: 0.34 [IU]/mL (ref 0.30–0.70)
Heparin Unfractionated: 0.29 IU/mL — ABNORMAL LOW (ref 0.30–0.70)

## 2014-11-26 MED ORDER — SODIUM CHLORIDE 0.9 % IJ SOLN
10.0000 mL | INTRAMUSCULAR | Status: DC | PRN
  Administered 2014-11-26: 20 mL

## 2014-11-26 NOTE — Progress Notes (Signed)
Right Lower Extremity Vein Map    Right Great Saphenous Vein Dialysis access   Right Small Saphenous Vein  Segment Diameter Comment  1. Origin 2.49mm Intimal hyperplasia  2. High Calf 2.74mm Intimal hyperplasia  3. Low Calf 3.26mm   4. Ankle 2.57mm                Left Lower Extremity Vein Map    Left Great Saphenous Vein   Segment Diameter Comment  1. Origin 4.65mm   2. High Thigh 2.67mm   3. Mid Thigh 2.49mm   4. Low Thigh 3.12mm   5. At Knee 2.35mm   6. High Calf 0.42mm Branch  7. Low Calf  Unable to visualize  8. Ankle  Unable to visualize                Left Small Saphenous Vein  Segment Diameter Comment  1. Origin 3.77mm   2. High Calf 2.17mm   3. Low Calf 2.92mm   4. Ankle 2.56mm                11/26/2014 11:09 AM Maudry Mayhew, RVT, RDCS, RDMS

## 2014-11-26 NOTE — Progress Notes (Signed)
[facility]                                                                              Patient Demographics  Brandon Sharp, is a 69 y.o. male, DOB - 19-Apr-1946, TDV:[id number]  Admit date - 11/16/2014   Admitting Physician Erline Hau, MD  Outpatient Primary MD for the patient is [name], MD  LOS - 8   Chief Complaint  Patient presents with  . Abnormal Lab       Brief HPI   69 year old man with significant past medical history for end-stage renal disease on hemodialysis Monday Wednesday Friday, status post DVT on Coumadin, hypertension, diabetes who presented with severe weakness and found to have a hemoglobin 6.5.  On 6/8 he was found to have a hemoglobin of 7.4 and was given 1 unit of PRBCs. His recent history is significant for a right thigh dialysis graft done in March of this year by Dr. Bridgett Larsson. Right groin ultrasound is done in the ED and shows a large hypoechoic structure consistent with a hematoma. S/p vasc surgery with wound vac placement.   Assessment & Plan    Principal Problem:  Symptomatic anemia -Status post 2 units of PRBCs, likely due to right groin hematoma due to dehiscence of his AVG status post evacuation and wound VAC by Dr. Bridgett Larsson - No complaints of GI bleeding, will obtain stool occult test. - IV iron and Epogen therapy as per renal service discretion  Active problems Peripheral arterial disease Vascular surgery following, patient underwent right leg angiogram Angiogram showed: chronic right superficial femoral artery occlusion with reconstitution of the below-knee popliteal artery. Options would include an additional attempts at recanalization, angioplasty and stenting. Other options would include revascularization with right femoral to below-knee popliteal bypass -Vein mapping done today  hypoglycemia with type 2 diabetes Blood sugars currently controlled, continue sliding scale insulin Hemoglobin A1c  5.6  DVT, bilateral upper extremity  -Has been on Coumadin since March 2016. -Continue heparin drip, coumadin currently held  Heart transplant -Continue immunosuppressive medications.  End-stage renal disease on hemodialysis -Appreciate nephrology following. Hemodialysis MWF.  Hypertension -Stable and well control will continue home antihypertensive regimen -Patient on HD M-W-F which is also helping volume control  Code Status: Full code  Family Communication: Discussed in detail with the patient, all imaging results, lab results explained to the patient    Disposition Plan:   Time Spent in minutes   25 minutes  Procedures  Right groin hematoma evacuation Angiogram Hemodialysis  Consults   Nephrology Vascular surgery  DVT Prophylaxis  heparin drip  Medications  Scheduled Meds: . atorvastatin  40 mg Oral Daily  . azaTHIOprine  75 mg Oral q morning - 10a  . calcium acetate  667 mg Oral BID WC  . carvedilol  6.25 mg Oral QHS  . colesevelam  3,750 mg Oral Daily  . darbepoetin (ARANESP) injection - DIALYSIS  200 mcg Intravenous Q Wed-HD  . docusate sodium  100 mg Oral Daily  . doxercalciferol  4 mcg Intravenous Q M,W,F-HD  . famotidine  10 mg Oral QHS  . feeding supplement (PRO-STAT SUGAR FREE  64)  30 mL Oral BID  . feeding supplement (RESOURCE BREEZE)  1 Container Oral BID BM  . ferric gluconate (FERRLECIT/NULECIT) IV  125 mg Intravenous Q M,W,F-HD  . insulin aspart  0-5 Units Subcutaneous QHS  . insulin aspart  0-9 Units Subcutaneous TID WC  . insulin aspart  3 Units Subcutaneous TID WC  . isosorbide dinitrate  40 mg Oral 3 times per day  . multivitamin  1 tablet Oral QHS  . mupirocin ointment  1 application Nasal BID  . pantoprazole  40 mg Oral Daily  . predniSONE  5 mg Oral Q breakfast  . sirolimus  1.5 mg Oral Daily  . vancomycin  750 mg Intravenous Q M,W,F-HD  . Warfarin - Pharmacist Dosing Inpatient   Does not apply q1800   Continuous Infusions: .  sodium chloride 10 mL/hr at 11/25/14 0044  . heparin 1,100 Units/hr (11/26/14 0006)   PRN Meds:.acetaminophen **OR** acetaminophen, hydrALAZINE, labetalol, metoprolol, ondansetron, ondansetron **OR** [DISCONTINUED] ondansetron (ZOFRAN) IV, oxyCODONE, senna-docusate   Antibiotics   Anti-infectives    Start     Dose/Rate Route Frequency Ordered Stop   11/21/14 0921  vancomycin (VANCOCIN) 1 GM/200ML IVPB    Comments:  Elige Ko   : cabinet override      11/21/14 0921 11/21/14 1025   11/18/14 1200  vancomycin (VANCOCIN) IVPB 750 mg/150 ml premix     750 mg 150 mL/hr over 60 Minutes Intravenous Every M-W-F (Hemodialysis) 11/17/14 0051     11/17/14 0100  vancomycin (VANCOCIN) 1,500 mg in sodium chloride 0.9 % 500 mL IVPB     1,500 mg 250 mL/hr over 120 Minutes Intravenous  Once 11/17/14 0051 11/17/14 0407        Subjective:   Brandon Sharp was seen and examined today.  Patient denies dizziness, chest pain, shortness of breath, abdominal pain, N/V/D/C, new weakness, numbess, tingling. No acute events overnight.  Wound VAC on  Objective:   Blood pressure 105/48, pulse 89, temperature 97.8 F (36.6 C), temperature source Oral, resp. rate 18, height 6' (1.829 m), weight 70 kg (154 lb 5.2 oz), SpO2 99 %.  Wt Readings from Last 3 Encounters:  11/25/14 70 kg (154 lb 5.2 oz)  11/08/14 74.844 kg (165 lb)  10/20/14 72.122 kg (159 lb)     Intake/Output Summary (Last 24 hours) at 11/26/14 1115 Last data filed at 11/25/14 2047  Gross per 24 hour  Intake      0 ml  Output   1655 ml  Net  -1655 ml    Exam  General: Alert and oriented x 3, NAD  HEENT:  PERRLA, EOMI, Anicteric Sclera, mucous membranes moist.   Neck: Supple, no JVD, no masses  CVS: S1 S2 auscultated, no rubs, murmurs or gallops. Regular rate and rhythm.  Respiratory: Clear to auscultation bilaterally, no wheezing, rales or rhonchi  Abdomen: Soft, nontender, nondistended, + bowel sounds  Ext: no cyanosis  clubbing, 1+ edema, wound VAC to the right groin, bilateral upper extremity edema  Neuro: AAOx3, Cr N's II- XII. Strength 5/5 upper and lower extremities bilaterally  Skin: No rashes  Psych: Normal affect and demeanor, alert and oriented x3    Data Review   Micro Results Recent Results (from the past 240 hour(s))  Surgical pcr screen     Status: Abnormal   Collection Time: 11/21/14  4:54 AM  Result Value Ref Range Status   MRSA, PCR POSITIVE (A) NEGATIVE Final   Staphylococcus aureus POSITIVE (A) NEGATIVE Final  Comment:        The Xpert SA Assay (FDA approved for NASAL specimens in patients over 16 years of age), is one component of a comprehensive surveillance program.  Test performance has been validated by [facility] for patients greater than or equal to 32 year old. It is not intended to diagnose infection nor to guide or monitor treatment.   Anaerobic culture     Status: None   Collection Time: 11/21/14 10:07 AM  Result Value Ref Range Status   Specimen Description WOUND GROIN RIGHT  Final   Special Requests RIGHT GROIN HEMATOMA PT ON VANCOMYCIN  Final   Gram Stain   Final    NO WBC SEEN NO SQUAMOUS EPITHELIAL CELLS SEEN NO ORGANISMS SEEN Performed at [facility]    Culture   Final    NO ANAEROBES ISOLATED Performed at [facility]    Report Status 11/26/2014 FINAL  Final  Wound culture     Status: None   Collection Time: 11/21/14 10:07 AM  Result Value Ref Range Status   Specimen Description WOUND GROIN RIGHT  Final   Special Requests RIGHT GROIN HEMATOMA PT ON VANCOMYCIN  Final   Gram Stain   Final    NO WBC SEEN NO SQUAMOUS EPITHELIAL CELLS SEEN NO ORGANISMS SEEN Performed at [facility]    Culture   Final    NO GROWTH 3 DAYS Performed at [facility]    Report Status 11/24/2014 FINAL  Final    Radiology Reports Dg Chest 2 View  11/11/2014   CLINICAL DATA:  Short of breath, low hemoglobin blood  transfusion  EXAM: CHEST  2 VIEW  COMPARISON:  08/25/2014  FINDINGS: Vascular stents noted within the subclavian veins. Large-bore left central venous line with tip in the distal SVC. Normal cardiac silhouette. No pulmonary edema. Mild left basilar atelectasis. No pneumothorax.  IMPRESSION: 1. Mild left basilar atelectasis. 2. No edema or infiltrate.   Electronically Signed   By: Suzy Bouchard M.D.   On: 11/11/2014 19:29    CBC  Recent Labs Lab 11/23/14 0536 11/23/14 1205 11/24/14 0408 11/25/14 1632 11/26/14 0753  WBC 3.8* 5.4 3.9* 5.1 4.5  HGB 7.5* 8.8* 8.3* 8.0* 7.7*  HCT 24.4* 28.2* 26.3* 25.6* 25.0*  PLT 69* 65* 57* 85* 90*  MCV 97.2 94.6 97.8 96.2 96.5  MCH 29.9 29.5 30.9 30.1 29.7  MCHC 30.7 31.2 31.6 31.3 30.8  RDW 18.0* 17.9* 17.9* 17.9* 18.1*    Chemistries   Recent Labs Lab 11/21/14 0550 11/23/14 1205 11/24/14 0408 11/25/14 1744 11/26/14 0753  NA 134* 134* 132* 132* 134*  K 4.5 3.6 4.4 4.1 4.0  CL 104 96* 95* 96* 99*  CO2 23 30 29 27 27   GLUCOSE 81 130* 88 115* 94  BUN 34* 12 17 19  22*  CREATININE 7.69* 3.17* 4.49* 4.35* 5.35*  CALCIUM 9.2 8.0* 8.6* 8.4* 8.8*   ------------------------------------------------------------------------------------------------------------------ estimated creatinine clearance is 12.9 mL/min (by C-G formula based on Cr of 5.35). ------------------------------------------------------------------------------------------------------------------ No results for input(s): HGBA1C in the last 72 hours. ------------------------------------------------------------------------------------------------------------------ No results for input(s): CHOL, HDL, LDLCALC, TRIG, CHOLHDL, LDLDIRECT in the last 72 hours. ------------------------------------------------------------------------------------------------------------------ No results for input(s): TSH, T4TOTAL, T3FREE, THYROIDAB in the last 72 hours.  Invalid input(s):  FREET3 ------------------------------------------------------------------------------------------------------------------ No results for input(s): VITAMINB12, FOLATE, FERRITIN, TIBC, IRON, RETICCTPCT in the last 72 hours.  Coagulation profile  Recent Labs Lab 11/23/14 0536 11/24/14 0408 11/25/14 0430 11/25/14 0705 11/26/14 0753  INR 1.59*  1.73* 3.91* 1.22 1.31    No results for input(s): DDIMER in the last 72 hours.  Cardiac Enzymes No results for input(s): CKMB, TROPONINI, MYOGLOBIN in the last 168 hours.  Invalid input(s): CK ------------------------------------------------------------------------------------------------------------------ Invalid input(s): [place]  11/24/14 1630 11/24/14 2122 11/25/14 0759 11/25/14 1239 11/25/14 2038 11/26/14 0600  GLUCAP 122* 119* 92 129* 123* 98     Hibba Schram M.D. [facility] 11/26/2014, 11:15 AM  Pager: [phone]   Between 7am to 7pm - call Pager - [phone]  After 7pm go to [email or web address] - password [email or web address]  Call night coverage person covering after 7pm

## 2014-11-26 NOTE — Progress Notes (Signed)
Subjective:  No complaints, sitting up in bed, has not walked much yet  Objective Vital signs in last 24 hours: Filed Vitals:   11/25/14 2005 11/25/14 2044 11/26/14 0010 11/26/14 0604  BP: 135/64 125/41 108/44 105/48  Pulse: 90 110 95 89  Temp: 97.5 F (36.4 C) 97.7 F (36.5 C)  97.8 F (36.6 C)  TempSrc: Oral Axillary  Oral  Resp: 18 18  18   Height:      Weight: 70 kg (154 lb 5.2 oz)     SpO2: 95% 100%  99%   Weight change:   Physical Exam: Gen: Alert OX3 pleasant NAD Heart: RRR , no mur , rub or gallop Lungs:CTA bilat Abdomen: soft NT, ND Extremities: no LE edema. RUE edema 2+ chronic Neuro: alert, no distress  Access: left IJ perm cath and right thigh AVGG + bruit, R groin wound w VAC in place   NW MWF 4h 72kg [place] 2/2 Heparin 3000 Left IJ/ R fem AVG 5/26 Hectorol 4 Aranesp 200 q Wed no Fe  Assessment: 1. Dehisced R thigh AVG wound w hematoma , s/p hematoma evacuation + wound VAC placement 6/27 - on Vanc 2. RLE arterial insufficiency - s/p angio yest, per VVS possible bypass vs re-attempt perc solution 3. Anemia - multiple etiologies- due to AVG bleed / CKD Anemia/ and immunosuppression meds- s/p transfusion. Hb 8.3  Fairly stable on max esa and Fe load 4. 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 Coumadin currently on hold. 5. ESRD - MWF HD 6. Metabolic bone disease - ON  Hectorol 4/  corec ca 9.7 phos 2.3 >1.3- stopped phoslo  For now 7. Volume/ HTN=: BP stable with HDand  low dose coreg/  Edema improved. For HD today. Under dry wt by  2kg, BP's good 8. 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 Dm type 2- per primary service 10. Malnutrition - alb 2.3>2.6 - renal diet/vits/supplements/nutrition consult 11. Thrombocytopenia- on immunosuppressive meds   Plan - HD Monday    Kelly Splinter MD pager [phone]    cell [phone] 11/26/2014, 12:00 PM    Labs: Basic Metabolic Panel:  Recent Labs Lab  11/20/14 0500  11/23/14 1205 11/24/14 0408 11/25/14 1744 11/26/14 0753  NA 135  < > 134* 132* 132* 134*  K 4.3  < > 3.6 4.4 4.1 4.0  CL 100*  < > 96* 95* 96* 99*  CO2 28  < > 30 29 27 27   GLUCOSE 107*  < > 130* 88 115* 94  BUN 23*  < > 12 17 19  22*  CREATININE 5.99*  < > 3.17* 4.49* 4.35* 5.35*  CALCIUM 8.7*  < > 8.0* 8.6* 8.4* 8.8*  PHOS 2.3*  --  1.5*  --  2.3*  --   < > = values in this interval not displayed. Liver Function Tests:  Recent Labs Lab 11/20/14 0500 11/23/14 1205 11/25/14 1744  ALBUMIN 2.3* 2.6* 2.6*     Recent Labs Lab 11/23/14 0536 11/23/14 1205 11/24/14 0408 11/25/14 1632 11/26/14 0753  WBC 3.8* 5.4 3.9* 5.1 4.5  HGB 7.5* 8.8* 8.3* 8.0* 7.7*  HCT 24.4* 28.2* 26.3* 25.6* 25.0*  MCV 97.2 94.6 97.8 96.2 96.5  PLT 69* 65* 57* 85* 90*  CBG:  Recent Labs Lab 11/24/14 2122 11/25/14 0759 11/25/14 1239 11/25/14 2038 11/26/14 0600  GLUCAP 119* 92 129* 123* 97    Studies/Results: No results found. Medications: . sodium chloride 10 mL/hr at  11/25/14 0044  . heparin 1,100 Units/hr (11/26/14 0006)   . atorvastatin  40 mg Oral Daily  . azaTHIOprine  75 mg Oral q morning - 10a  . calcium acetate  667 mg Oral BID WC  . carvedilol  6.25 mg Oral QHS  . colesevelam  3,750 mg Oral Daily  . darbepoetin (ARANESP) injection - DIALYSIS  200 mcg Intravenous Q Wed-HD  . docusate sodium  100 mg Oral Daily  . doxercalciferol  4 mcg Intravenous Q M,W,F-HD  . famotidine  10 mg Oral QHS  . feeding supplement (PRO-STAT SUGAR FREE 64)  30 mL Oral BID  . feeding supplement (RESOURCE BREEZE)  1 Container Oral BID BM  . ferric gluconate (FERRLECIT/NULECIT) IV  125 mg Intravenous Q M,W,F-HD  . insulin aspart  0-5 Units Subcutaneous QHS  . insulin aspart  0-9 Units Subcutaneous TID WC  . insulin aspart  3 Units Subcutaneous TID WC  . isosorbide dinitrate  40 mg Oral 3 times per day  . multivitamin  1 tablet Oral QHS  . mupirocin ointment  1 application Nasal BID   . pantoprazole  40 mg Oral Daily  . predniSONE  5 mg Oral Q breakfast  . sirolimus  1.5 mg Oral Daily  . vancomycin  750 mg Intravenous Q M,W,F-HD  . Warfarin - Pharmacist Dosing Inpatient   Does not apply [phone]

## 2014-11-26 NOTE — Progress Notes (Addendum)
ANTICOAGULATION CONSULT NOTE - Follow Up Consult  Pharmacy Consult for Heparin, Coumadin on hold Indication: h/o DVT  Allergies  Allergen Reactions  . Lisinopril Swelling    Lips and tongue swell  . Niacin And Related Other (See Comments)    unknown  . Norvasc [Amlodipine Besylate] Rash    Flushing  . Penicillins Rash    Patient Measurements: Height: 6' (182.9 cm) Weight: 154 lb 5.2 oz (70 kg) IBW/kg (Calculated) : 77.6 Heparin Dosing Weight: 73 kg  Vital Signs: Temp: 97.8 F (36.6 C) (07/02 0604) Temp Source: Oral (07/02 0604) BP: 105/48 mmHg (07/02 0604) Pulse Rate: 89 (07/02 0604)  Labs:  Recent Labs  11/24/14 0408 11/25/14 0430 11/25/14 0705 11/25/14 1632 11/25/14 1744 11/26/14 0753  HGB 8.3*  --   --  8.0*  --  7.7*  HCT 26.3*  --   --  25.6*  --  25.0*  PLT 57*  --   --  85*  --  PENDING  LABPROT 20.3* 37.3* 15.6*  --   --  16.4*  INR 1.73* 3.91* 1.22  --   --  1.31  HEPARINUNFRC 0.41 0.54  --   --   --  0.29*  CREATININE 4.49*  --   --   --  4.35* 5.35*    Estimated Creatinine Clearance: 12.9 mL/min (by C-G formula based on Cr of 5.35).  Assessment: 69 y.o. on Coumadin PTA for hx of DVT(March 2016), s/p hematoma evacuation 6/27. Pt on heparin bridge to coumadin. Heparin level therapeutic (0.41) this morning on 1100 units/hr. INR 1.73, plan for abdominal aortogram tomorrow, hold coumadin again, also received IV vitamin K 1mg  this morning. Hgb low but stable. Thrombocytopenia at baseline, but Pltc down to 57K this morning. No bleeding noted.  7/1 - now s/p aortogram, spoke to Dr. Bridgett Larsson, okay to resume IV heparin 6 hrs after procedure. He does not want [place] to resume Coumadin yet b/c plan for patient not known yet. Will have vein mapping in next few days to help determine plan.  7/2 AC - heparin level low at 0.29, heparin started late overnight but level re-timed to reflect this. No other IV issues or bleeding noted. Coumadin remains on hold, INR  1.3.  Infectious Disease: Wound infection, afebrile, WBC 5 yesterday but pending still this am. Pt now on regular HD schedule MWF - with last HD session yesterday with vancomycin given. Will plan on checking vancomycin level next week ~ Wednesday. Goal of Therapy:  Heparin level 0.3 - 0.7 units/ml Monitor platelets by anticoagulation protocol: Yes   Plan:  Continue to hold Coumadin. Increase heparin rate to 1200 units/hr Check heparin level in 8 hours. Daily INR, heparin level and CBC. Continue vancomycin 750 each HD session  Erin Hearing PharmD., BCPS Clinical Pharmacist Pager [phone] 11/26/2014 9:27 AM  Addendum:  HL came back therapeutic this PM.  Cont heparin at 1200 units/hr  Onnie Boer, PharmD Pager: [phone] 11/26/2014 7:40 PM

## 2014-11-27 LAB — RENAL FUNCTION PANEL
ANION GAP: 10 (ref 5–15)
Albumin: 2.5 g/dL — ABNORMAL LOW (ref 3.5–5.0)
BUN: 32 mg/dL — ABNORMAL HIGH (ref 6–20)
CHLORIDE: 98 mmol/L — AB (ref 101–111)
CO2: 24 mmol/L (ref 22–32)
Calcium: 9 mg/dL (ref 8.9–10.3)
Creatinine, Ser: 7.78 mg/dL — ABNORMAL HIGH (ref 0.61–1.24)
GFR calc Af Amer: 7 mL/min — ABNORMAL LOW (ref >60)
GFR, EST NON AFRICAN AMERICAN: 6 mL/min — AB (ref >60)
GLUCOSE: 114 mg/dL — AB (ref 65–99)
Phosphorus: 3.7 mg/dL (ref 2.5–4.6)
Potassium: 4.5 mmol/L (ref 3.5–5.1)
SODIUM: 132 mmol/L — AB (ref 135–145)

## 2014-11-27 LAB — GLUCOSE, CAPILLARY
GLUCOSE-CAPILLARY: 96 mg/dL (ref 65–99)
GLUCOSE-CAPILLARY: 107 mg/dL — AB (ref 65–99)
GLUCOSE-CAPILLARY: 85 mg/dL (ref 65–99)
Glucose-Capillary: 114 mg/dL — ABNORMAL HIGH (ref 65–99)

## 2014-11-27 LAB — PROTIME-INR
INR: 1.31 (ref 0.00–1.49)
Prothrombin Time: 16.5 seconds — ABNORMAL HIGH (ref 11.6–15.2)

## 2014-11-27 LAB — CBC
HCT: 23.9 % — ABNORMAL LOW (ref 39.0–52.0)
Hemoglobin: 7.5 g/dL — ABNORMAL LOW (ref 13.0–17.0)
MCH: 30 pg (ref 26.0–34.0)
MCHC: 31.4 g/dL (ref 30.0–36.0)
MCV: 95.6 fL (ref 78.0–100.0)
PLATELETS: 85 10*3/uL — AB (ref 150–400)
RBC: 2.5 MIL/uL — ABNORMAL LOW (ref 4.22–5.81)
RDW: 18.5 % — ABNORMAL HIGH (ref 11.5–15.5)
WBC: 4.7 10*3/uL (ref 4.0–10.5)

## 2014-11-27 LAB — HEPARIN LEVEL (UNFRACTIONATED): HEPARIN UNFRACTIONATED: 0.37 [IU]/mL (ref 0.30–0.70)

## 2014-11-27 LAB — PREPARE RBC (CROSSMATCH)

## 2014-11-27 MED ORDER — SODIUM CHLORIDE 0.9 % IV SOLN: Freq: Once | INTRAVENOUS | Status: DC

## 2014-11-27 NOTE — Progress Notes (Signed)
  Rapamune not available per pharmacy. Patient to have family bring his rapamune from home tomorrow 7/4 as nobody can bring it today.

## 2014-11-27 NOTE — Progress Notes (Signed)
    Subjective  -   Patient does not report pain in his right foot   Physical Exam:  Normal motor function and right foot.  Chronic sensory changes.  No open wounds.    Studies: Marginal bilateral great saphenous vein   Assessment/Plan:    Dr. Bridgett Larsson will return on Wednesday and determine ultimate plan for revascularization.  The patient would like to go home.  If he is discharged, please schedule an appointment to see Dr. Bridgett Larsson this coming Friday.  Please contact me for further questions while he is in the hospital.  Annamarie Major 11/27/2014 9:54 AM --  Filed Vitals:   11/27/14 0611  BP: 102/39  Pulse: 87  Temp: 97.8 F (36.6 C)  Resp: 18    Intake/Output Summary (Last 24 hours) at 11/27/14 0954 Last data filed at 11/27/14 4193  Gross per 24 hour  Intake 1110.78 ml  Output      0 ml  Net 1110.78 ml     Laboratory CBC    Component Value Date/Time   WBC 4.7 11/27/2014 0317   WBC 4.5 02/13/2012 1314   HGB 7.5* 11/27/2014 0317   HGB 11.6* 02/13/2012 1314   HCT 23.9* 11/27/2014 0317   HCT 36.1* 02/13/2012 1314   PLT 85* 11/27/2014 0317   PLT 76* 02/13/2012 1314    BMET    Component Value Date/Time   NA 134* 11/26/2014 0753   K 4.0 11/26/2014 0753   CL 99* 11/26/2014 0753   CO2 27 11/26/2014 0753   GLUCOSE 94 11/26/2014 0753   BUN 22* 11/26/2014 0753   CREATININE 5.35* 11/26/2014 0753   CALCIUM 8.8* 11/26/2014 0753   GFRNONAA 10* 11/26/2014 0753   GFRAA 11* 11/26/2014 0753    COAG Lab Results  Component Value Date   INR 1.31 11/27/2014   INR 1.31 11/26/2014   INR 1.22 11/25/2014   No results found for: PTT  Antibiotics Anti-infectives    Start     Dose/Rate Route Frequency Ordered Stop   11/21/14 0921  vancomycin (VANCOCIN) 1 GM/200ML IVPB    Comments:  Mikeal Hawthorne, John   : cabinet override      11/21/14 0921 11/21/14 1025   11/18/14 1200  vancomycin (VANCOCIN) IVPB 750 mg/150 ml premix     750 mg 150 mL/hr over 60 Minutes Intravenous  Every M-W-F (Hemodialysis) 11/17/14 0051     11/17/14 0100  vancomycin (VANCOCIN) 1,500 mg in sodium chloride 0.9 % 500 mL IVPB     1,500 mg 250 mL/hr over 120 Minutes Intravenous  Once 11/17/14 0051 11/17/14 0407       V. Leia Alf, M.D. [facility] Office: [phone] Pager:  [phone]

## 2014-11-27 NOTE — Progress Notes (Signed)
[facility]                                                                              Patient Demographics  Brandon Sharp, is a 69 y.o. male, DOB - May 02, 1946, AVW:[id number]  Admit date - 11/16/2014   Admitting Physician Erline Hau, MD  Outpatient Primary MD for the patient is [name], MD  LOS - 9   Chief Complaint  Patient presents with  . Abnormal Lab       Brief HPI   69 year old man with significant past medical history for end-stage renal disease on hemodialysis Monday Wednesday Friday, status post DVT on Coumadin, hypertension, diabetes who presented with severe weakness and found to have a hemoglobin 6.5.  On 6/8 he was found to have a hemoglobin of 7.4 and was given 1 unit of PRBCs. His recent history is significant for a right thigh dialysis graft done in March of this year by Dr. Bridgett Larsson. Right groin ultrasound is done in the ED and shows a large hypoechoic structure consistent with a hematoma. S/p vasc surgery with wound vac placement.   Assessment & Plan    Principal Problem:  Symptomatic anemia -Status post 2 units of PRBCs, likely due to right groin hematoma due to dehiscence of his AVG status post evacuation and wound VAC by Dr. Bridgett Larsson - Hemoglobin still down to 7.5, will transfuse him 1 unit  - No complaints of GI bleeding, will obtain stool occult test. - IV iron and Epogen therapy as per renal service discretion  Active problems Peripheral arterial disease Vascular surgery following, patient underwent right leg angiogram Angiogram showed: chronic right superficial femoral artery occlusion with reconstitution of the below-knee popliteal artery. Options would include an additional attempts at recanalization, angioplasty and stenting. Other options would include revascularization with right femoral to below-knee popliteal bypass -Vein mapping done - Patient does not want to stay inpatient for revascularization planned  next week, he is hopeful that he can get this arranged outpatient with Dr. Bridgett Larsson. Patient will need hemodialysis tomorrow morning, awaiting case management to arrange for woundVAC at home.  hypoglycemia with type 2 diabetes Blood sugars currently controlled, continue sliding scale insulin Hemoglobin A1c 5.6  DVT, bilateral upper extremity  -Has been on Coumadin since March 2016. -Continue heparin drip, coumadin currently held  Heart transplant -Continue immunosuppressive medications.  End-stage renal disease on hemodialysis -Appreciate nephrology following. Hemodialysis MWF.  Hypertension -Stable and well control will continue home antihypertensive regimen -Patient on HD M-W-F which is also helping volume control  Code Status: Full code  Family Communication: Discussed in detail with the patient, all imaging results, lab results explained to the patient    Disposition Plan:   Time Spent in minutes   25 minutes  Procedures  Right groin hematoma evacuation Angiogram Hemodialysis  Consults   Nephrology Vascular surgery  DVT Prophylaxis  heparin drip  Medications  Scheduled Meds: . atorvastatin  40 mg Oral Daily  . azaTHIOprine  75 mg Oral q morning - 10a  . calcium acetate  667 mg Oral BID WC  . carvedilol  6.25 mg Oral QHS  .  colesevelam  3,750 mg Oral Daily  . darbepoetin (ARANESP) injection - DIALYSIS  200 mcg Intravenous Q Wed-HD  . docusate sodium  100 mg Oral Daily  . doxercalciferol  4 mcg Intravenous Q M,W,F-HD  . famotidine  10 mg Oral QHS  . feeding supplement (PRO-STAT SUGAR FREE 64)  30 mL Oral BID  . feeding supplement (RESOURCE BREEZE)  1 Container Oral BID BM  . ferric gluconate (FERRLECIT/NULECIT) IV  125 mg Intravenous Q M,W,F-HD  . insulin aspart  0-5 Units Subcutaneous QHS  . insulin aspart  0-9 Units Subcutaneous TID WC  . insulin aspart  3 Units Subcutaneous TID WC  . isosorbide dinitrate  40 mg Oral 3 times per day  . multivitamin  1 tablet  Oral QHS  . pantoprazole  40 mg Oral Daily  . predniSONE  5 mg Oral Q breakfast  . sirolimus  1.5 mg Oral Daily  . vancomycin  750 mg Intravenous Q M,W,F-HD  . Warfarin - Pharmacist Dosing Inpatient   Does not apply q1800   Continuous Infusions: . sodium chloride 10 mL/hr (11/26/14 0006)  . heparin 1,200 Units/hr (11/26/14 2335)   PRN Meds:.acetaminophen **OR** acetaminophen, hydrALAZINE, labetalol, metoprolol, ondansetron, ondansetron **OR** [DISCONTINUED] ondansetron (ZOFRAN) IV, oxyCODONE, senna-docusate, sodium chloride   Antibiotics   Anti-infectives    Start     Dose/Rate Route Frequency Ordered Stop   11/21/14 0921  vancomycin (VANCOCIN) 1 GM/200ML IVPB    Comments:  Elige Ko   : cabinet override      11/21/14 0921 11/21/14 1025   11/18/14 1200  vancomycin (VANCOCIN) IVPB 750 mg/150 ml premix     750 mg 150 mL/hr over 60 Minutes Intravenous Every M-W-F (Hemodialysis) 11/17/14 0051     11/17/14 0100  vancomycin (VANCOCIN) 1,500 mg in sodium chloride 0.9 % 500 mL IVPB     1,500 mg 250 mL/hr over 120 Minutes Intravenous  Once 11/17/14 0051 11/17/14 0407        Subjective:   Brandon Sharp was seen and examined today.  Denies any specific complaints. Patient denies dizziness, chest pain, shortness of breath, abdominal pain, N/V/D/C, new weakness, numbess, tingling. No acute events overnight.  Wound VAC on+  Objective:   Blood pressure 102/39, pulse 87, temperature 97.8 F (36.6 C), temperature source Oral, resp. rate 18, height 6' (1.829 m), weight 70 kg (154 lb 5.2 oz), SpO2 97 %.  Wt Readings from Last 3 Encounters:  11/25/14 70 kg (154 lb 5.2 oz)  11/08/14 74.844 kg (165 lb)  10/20/14 72.122 kg (159 lb)     Intake/Output Summary (Last 24 hours) at 11/27/14 1048 Last data filed at 11/27/14 5035  Gross per 24 hour  Intake 1110.78 ml  Output      0 ml  Net 1110.78 ml    Exam  General: Alert and oriented x 3, NAD  HEENT:  PERRLA, EOMI,   Neck:  Supple, no JVD, no masses  CVS: S1 S2 auscultated, no mrg,   Respiratory: CTAB  Abdomen: Soft, nontender, nondistended, + bowel sounds  Ext: no cyanosis clubbing, 1+ edema, wound VAC to the right groin, bilateral upper extremity edema  Neuro: no new deficits  Skin: No rashes  Psych: Normal affect and demeanor, alert and oriented x3    Data Review   Micro Results Recent Results (from the past 240 hour(s))  Surgical pcr screen     Status: Abnormal   Collection Time: 11/21/14  4:54 AM  Result Value Ref Range Status  MRSA, PCR POSITIVE (A) NEGATIVE Final   Staphylococcus aureus POSITIVE (A) NEGATIVE Final    Comment:        The Xpert SA Assay (FDA approved for NASAL specimens in patients over 86 years of age), is one component of a comprehensive surveillance program.  Test performance has been validated by [facility] for patients greater than or equal to 65 year old. It is not intended to diagnose infection nor to guide or monitor treatment.   Anaerobic culture     Status: None   Collection Time: 11/21/14 10:07 AM  Result Value Ref Range Status   Specimen Description WOUND GROIN RIGHT  Final   Special Requests RIGHT GROIN HEMATOMA PT ON VANCOMYCIN  Final   Gram Stain   Final    NO WBC SEEN NO SQUAMOUS EPITHELIAL CELLS SEEN NO ORGANISMS SEEN Performed at [facility]    Culture   Final    NO ANAEROBES ISOLATED Performed at [facility]    Report Status 11/26/2014 FINAL  Final  Wound culture     Status: None   Collection Time: 11/21/14 10:07 AM  Result Value Ref Range Status   Specimen Description WOUND GROIN RIGHT  Final   Special Requests RIGHT GROIN HEMATOMA PT ON VANCOMYCIN  Final   Gram Stain   Final    NO WBC SEEN NO SQUAMOUS EPITHELIAL CELLS SEEN NO ORGANISMS SEEN Performed at [facility]    Culture   Final    NO GROWTH 3 DAYS Performed at [facility]    Report Status 11/24/2014 FINAL  Final    Radiology  Reports Dg Chest 2 View  11/11/2014   CLINICAL DATA:  Short of breath, low hemoglobin blood transfusion  EXAM: CHEST  2 VIEW  COMPARISON:  08/25/2014  FINDINGS: Vascular stents noted within the subclavian veins. Large-bore left central venous line with tip in the distal SVC. Normal cardiac silhouette. No pulmonary edema. Mild left basilar atelectasis. No pneumothorax.  IMPRESSION: 1. Mild left basilar atelectasis. 2. No edema or infiltrate.   Electronically Signed   By: Suzy Bouchard M.D.   On: 11/11/2014 19:29    CBC  Recent Labs Lab 11/23/14 1205 11/24/14 0408 11/25/14 1632 11/26/14 0753 11/27/14 0317  WBC 5.4 3.9* 5.1 4.5 4.7  HGB 8.8* 8.3* 8.0* 7.7* 7.5*  HCT 28.2* 26.3* 25.6* 25.0* 23.9*  PLT 65* 57* 85* 90* 85*  MCV 94.6 97.8 96.2 96.5 95.6  MCH 29.5 30.9 30.1 29.7 30.0  MCHC 31.2 31.6 31.3 30.8 31.4  RDW 17.9* 17.9* 17.9* 18.1* 18.5*    Chemistries   Recent Labs Lab 11/21/14 0550 11/23/14 1205 11/24/14 0408 11/25/14 1744 11/26/14 0753  NA 134* 134* 132* 132* 134*  K 4.5 3.6 4.4 4.1 4.0  CL 104 96* 95* 96* 99*  CO2 23 30 29 27 27   GLUCOSE 81 130* 88 115* 94  BUN 34* 12 17 19  22*  CREATININE 7.69* 3.17* 4.49* 4.35* 5.35*  CALCIUM 9.2 8.0* 8.6* 8.4* 8.8*   ------------------------------------------------------------------------------------------------------------------ estimated creatinine clearance is 12.9 mL/min (by C-G formula based on Cr of 5.35). ------------------------------------------------------------------------------------------------------------------ No results for input(s): HGBA1C in the last 72 hours. ------------------------------------------------------------------------------------------------------------------ No results for input(s): CHOL, HDL, LDLCALC, TRIG, CHOLHDL, LDLDIRECT in the last 72 hours. ------------------------------------------------------------------------------------------------------------------ No results for input(s):  TSH, T4TOTAL, T3FREE, THYROIDAB in the last 72 hours.  Invalid input(s): FREET3 ------------------------------------------------------------------------------------------------------------------ No results for input(s): VITAMINB12, FOLATE, FERRITIN, TIBC, IRON, RETICCTPCT in the last 72 hours.  Coagulation profile  Recent Labs Lab 11/24/14 0408 11/25/14 0430 11/25/14 0705 11/26/14 0753 11/27/14 0317  INR 1.73* 3.91* 1.22 1.31 1.31    No results for input(s): DDIMER in the last 72 hours.  Cardiac Enzymes No results for input(s): CKMB, TROPONINI, MYOGLOBIN in the last 168 hours.  Invalid input(s): CK ------------------------------------------------------------------------------------------------------------------ Invalid input(s): [place]  11/25/14 0759 11/25/14 1239 11/25/14 2038 11/26/14 0600 11/26/14 1903 11/26/14 2058  GLUCAP 92 129* 32* 39 99 103*     Lenix Kidd M.D. [facility] 11/27/2014, 10:48 AM  Pager: [phone]   Between 7am to 7pm - call Pager - [phone]  After 7pm go to [email or web address] - password [email or web address]  Call night coverage person covering after 7pm

## 2014-11-27 NOTE — Progress Notes (Signed)
Spoke with Dr. Tana Coast; order to transfuse 1 unit PRBC with HD in AM; will cont. To monitor.

## 2014-11-27 NOTE — Progress Notes (Signed)
Subjective:  No cos  Objective Vital signs in last 24 hours: Filed Vitals:   11/26/14 1437 11/26/14 1955 11/27/14 0611 11/27/14 1407  BP: 138/54 113/39 102/39 148/53  Pulse: 86 93 87 89  Temp: 98.1 F (36.7 C) 98.9 F (37.2 C) 97.8 F (36.6 C) 97.8 F (36.6 C)  TempSrc: Oral Oral Oral Oral  Resp: 17 18 18 19   Height:      Weight:      SpO2: 98% 93% 97% 99%   Weight change:   Physical Exam: Gen: Alert OX3 NAD,pleasant   Heart: RRR , no mur , rub / gallop Lungs:CTA bilat Abdomen: soft NT, ND Extremities: no LE edema. RUE edema 2+ chronic  Access: left IJ perm cath and right thigh AVGG + bruit, R groin wound w VAC in place    OP HD= [facility] MWF 4h 72kg [place] 2/2 Heparin 3000 Left IJ/ R fem AVG 5/26 Hectorol 4 Aranesp 200 q Wed no Fe    Problem/Plan 1. Dehisced R thigh AVG wound w hematoma , s/p hematoma evacuation + wound VAC placement 6/27 - on Vancomycin  Hd days  Wound culture of groin no growth  2. RLE arterial insufficiency - s/p angio / awaiting  Dr.Chen input "back in town WED "( per VVS possible bypass vs re-attempt perc solution 3. Anemia - multiple etiologies- due to AVG bleed / CKD Anemia/ and immunosuppression meds- s/p transfusion. Hb 8.3> 7.7>7.5Today/  on max esa and Fe load/ transfuse PRBCS  on HD in am  4. ESRD - MWF HD 5. Metabolic bone disease - ON Hectorol 4/ corec ca 9.7 phos 2.3 <1.3  phoslobid now  6. Volume/ HTN=: BP stable with HDand low dose coreg/  Edema improved. For HD today. Under dry wt by 2kg, BP's good 7. 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 Coumadin currently on hold./ on iv hep 8. 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 Dm type 2- per primary service 10. Malnutrition - alb 2.3>2.6 - renal diet/vits/supplements/nutrition consult 11. Thrombocytopenia- on immunosuppressive meds  Ernest Haber, PA-C [facility] Beeper [phone] 11/27/2014,3:08 PM  LOS: 9  days   Pt seen, examined and agree w A/P as above. Patient will need to get some form of IV access other than the tunneled HD cath, will d/w primary. HD is being done today with transfusion instead of tomorrow.  Kelly Splinter MD pager [phone]    cell [phone] 11/27/2014, 5:00 PM    Labs: Basic Metabolic Panel:  Recent Labs Lab 11/23/14 1205 11/24/14 0408 11/25/14 1744 11/26/14 0753  NA 134* 132* 132* 134*  K 3.6 4.4 4.1 4.0  CL 96* 95* 96* 99*  CO2 30 29 27 27   GLUCOSE 130* 88 115* 94  BUN 12 17 19  22*  CREATININE 3.17* 4.49* 4.35* 5.35*  CALCIUM 8.0* 8.6* 8.4* 8.8*  PHOS 1.5*  --  2.3*  --    Liver Function Tests:  Recent Labs Lab 11/23/14 1205 11/25/14 1744  ALBUMIN 2.6* 2.6*  CBC:  Recent Labs Lab 11/23/14 1205 11/24/14 0408 11/25/14 1632 11/26/14 0753 11/27/14 0317  WBC 5.4 3.9* 5.1 4.5 4.7  HGB 8.8* 8.3* 8.0* 7.7* 7.5*  HCT 28.2* 26.3* 25.6* 25.0* 23.9*  MCV 94.6 97.8 96.2 96.5 95.6  PLT 65* 57* 85* 90* 85*   CBG:  Recent Labs Lab 11/26/14 0600 11/26/14 1903 11/26/14 2058 11/27/14 0645 11/27/14 1108  GLUCAP 97 99 103* 96 107*  Studies/Results: No results found. Medications: . sodium chloride 10 mL/hr (11/26/14 0006)  . heparin 1,200 Units/hr (11/26/14 2335)   . sodium chloride   Intravenous Once  . atorvastatin  40 mg Oral Daily  . azaTHIOprine  75 mg Oral q morning - 10a  . calcium acetate  667 mg Oral BID WC  . carvedilol  6.25 mg Oral QHS  . colesevelam  3,750 mg Oral Daily  . darbepoetin (ARANESP) injection - DIALYSIS  200 mcg Intravenous Q Wed-HD  . docusate sodium  100 mg Oral Daily  . doxercalciferol  4 mcg Intravenous Q M,W,F-HD  . famotidine  10 mg Oral QHS  . feeding supplement (PRO-STAT SUGAR FREE 64)  30 mL Oral BID  . feeding supplement (RESOURCE BREEZE)  1 Container Oral BID BM  . ferric gluconate (FERRLECIT/NULECIT) IV  125 mg Intravenous Q M,W,F-HD  . insulin aspart  0-5 Units Subcutaneous QHS  . insulin aspart   0-9 Units Subcutaneous TID WC  . insulin aspart  3 Units Subcutaneous TID WC  . isosorbide dinitrate  40 mg Oral 3 times per day  . multivitamin  1 tablet Oral QHS  . pantoprazole  40 mg Oral Daily  . predniSONE  5 mg Oral Q breakfast  . sirolimus  1.5 mg Oral Daily  . vancomycin  750 mg Intravenous Q M,W,F-HD  . Warfarin - Pharmacist Dosing Inpatient   Does not apply [phone]

## 2014-11-27 NOTE — Progress Notes (Signed)
ANTICOAGULATION CONSULT NOTE - Follow Up Consult  Pharmacy Consult for Heparin, Coumadin on hold, vanc Indication: h/o DVT, wound infection  Allergies  Allergen Reactions  . Lisinopril Swelling    Lips and tongue swell  . Niacin And Related Other (See Comments)    unknown  . Norvasc [Amlodipine Besylate] Rash    Flushing  . Penicillins Rash    Patient Measurements: Height: 6' (182.9 cm) Weight: 154 lb 5.2 oz (70 kg) IBW/kg (Calculated) : 77.6 Heparin Dosing Weight: 73 kg  Vital Signs: Temp: 97.8 F (36.6 C) (07/03 0611) Temp Source: Oral (07/03 0611) BP: 102/39 mmHg (07/03 0611) Pulse Rate: 87 (07/03 0611)  Labs:  Recent Labs  11/25/14 0705  11/25/14 1632 11/25/14 1744 11/26/14 0753 11/26/14 1804 11/27/14 0317  HGB  --   < > 8.0*  --  7.7*  --  7.5*  HCT  --   --  25.6*  --  25.0*  --  23.9*  PLT  --   --  85*  --  90*  --  85*  LABPROT 15.6*  --   --   --  16.4*  --  16.5*  INR 1.22  --   --   --  1.31  --  1.31  HEPARINUNFRC  --   --   --   --  0.29* 0.34 0.37  CREATININE  --   --   --  4.35* 5.35*  --   --   < > = values in this interval not displayed.  Estimated Creatinine Clearance: 12.9 mL/min (by C-G formula based on Cr of 5.35).  Assessment: 69 y.o. on Coumadin PTA for hx of DVT(March 2016), s/p hematoma evacuation 6/27. Pt on heparin bridge to coumadin. Heparin level therapeutic (0.41) this morning on 1100 units/hr. INR 1.73, plan for abdominal aortogram tomorrow, hold coumadin again, also received IV vitamin K 1mg  this morning. Hgb low but stable. Thrombocytopenia at baseline, but Pltc down to 57K this morning. No bleeding noted.  7/1 - now s/p aortogram, spoke to Dr. Bridgett Larsson, okay to resume IV heparin 6 hrs after procedure. He does not want [place] to resume Coumadin yet b/c plan for patient not known yet. Will have vein mapping in next few days to help determine plan.  The plan is to hold coumadin since he'll need further procedure. Pt wants to go home  tomorrow. Outpt anticoag will be an issue. D/w Dr Tana Coast.   Infectious Disease: He is on D10 of abx now. No fevers or leukocytosis. Consider readdressing the duration for abx.   Goal of Therapy:  PreHD vanc - 15-25 Heparin level 0.3 - 0.7 units/ml Monitor platelets by anticoagulation protocol: Yes   Plan:   Cont heparin 1200 unts/hr Daily level Cont vanc 750mg  IV qHD F/u with plan for outpt anticoag if discharged  Onnie Boer, PharmD Pager: [phone] 11/27/2014 11:59 AM

## 2014-11-28 LAB — HEPARIN LEVEL (UNFRACTIONATED): Heparin Unfractionated: 0.4 IU/mL (ref 0.30–0.70)

## 2014-11-28 LAB — GLUCOSE, CAPILLARY
GLUCOSE-CAPILLARY: 78 mg/dL (ref 65–99)
Glucose-Capillary: 100 mg/dL — ABNORMAL HIGH (ref 65–99)
Glucose-Capillary: 97 mg/dL (ref 65–99)

## 2014-11-28 LAB — TYPE AND SCREEN
ABO/RH(D): O POS
ANTIBODY SCREEN: NEGATIVE
UNIT DIVISION: 0

## 2014-11-28 LAB — CBC
HEMATOCRIT: 26.2 % — AB (ref 39.0–52.0)
Hemoglobin: 8.3 g/dL — ABNORMAL LOW (ref 13.0–17.0)
MCH: 29.6 pg (ref 26.0–34.0)
MCHC: 31.7 g/dL (ref 30.0–36.0)
MCV: 93.6 fL (ref 78.0–100.0)
Platelets: 67 10*3/uL — ABNORMAL LOW (ref 150–400)
RBC: 2.8 MIL/uL — ABNORMAL LOW (ref 4.22–5.81)
RDW: 20.2 % — AB (ref 11.5–15.5)
WBC: 3.5 10*3/uL — ABNORMAL LOW (ref 4.0–10.5)

## 2014-11-28 LAB — PROTIME-INR
INR: 1.31 (ref 0.00–1.49)
PROTHROMBIN TIME: 16.5 s — AB (ref 11.6–15.2)

## 2014-11-28 MED ORDER — VANCOMYCIN HCL IN DEXTROSE 750-5 MG/150ML-% IV SOLN
750.0000 mg | Freq: Once | INTRAVENOUS | Status: DC
  Filled 2014-11-28: qty 150

## 2014-11-28 MED ORDER — SIROLIMUS 0.5 MG PO TABS
1.5000 mg | ORAL_TABLET | Freq: Every day | ORAL | Status: DC
  Administered 2014-11-28 – 2014-12-02 (×5): 1.5 mg via ORAL
  Filled 2014-11-28 (×2): qty 3

## 2014-11-28 MED ORDER — SIROLIMUS 1 MG PO TABS: 1.5000 mg | ORAL_TABLET | Freq: Every day | ORAL | Status: DC

## 2014-11-28 MED ORDER — VANCOMYCIN HCL IN DEXTROSE 750-5 MG/150ML-% IV SOLN: 750.0000 mg | INTRAVENOUS | Status: DC

## 2014-11-28 MED ORDER — SIROLIMUS 1 MG PO TABS
1.5000 mg | ORAL_TABLET | Freq: Every day | ORAL | Status: DC
  Filled 2014-11-28: qty 2

## 2014-11-28 MED ORDER — HYDROMORPHONE HCL 2 MG PO TABS
ORAL_TABLET | ORAL | Status: AC
  Filled 2014-11-28: qty 4

## 2014-11-28 NOTE — Care Management Note (Signed)
Case Management Note  Patient Details  Name: MORT SMELSER MRN: [id number] Date of Birth: 1945/12/20  Subjective/Objective:    Presented with the diagnosis of possible steal sen of left leg.   S/P Abdominal Aortogram (N/A) as a surgical intervention                 Action/Plan:   Expected Discharge Date:                  Expected Discharge Plan:  Home/Self Care  In-House Referral:  Clinical Social Work  Discharge planning Services  CM Consult  Post Acute Care Choice:    Choice offered to:     DME Arranged:    DME Agency:     HH Arranged:    HH Agency:     Status of Service:  In process, will continue to follow  Medicare Important Message Given:  Yes-second notification given Date Medicare IM Given:    Medicare IM give by:    Date Additional Medicare IM Given:    Additional Medicare Important Message give by:     If discussed at [place] of Stay Meetings, dates discussed:    Additional Comments:  Dimas Aguas, RN 11/28/2014, 8:51 AM

## 2014-11-28 NOTE — Progress Notes (Signed)
ANTICOAGULATION CONSULT NOTE - Follow Up Consult  Pharmacy Consult for Heparin, Coumadin on hold, vanc Indication: h/o DVT, wound infection  Allergies  Allergen Reactions  . Lisinopril Swelling    Lips and tongue swell  . Niacin And Related Other (See Comments)    unknown  . Norvasc [Amlodipine Besylate] Rash    Flushing  . Penicillins Rash    Patient Measurements: Height: 6' (182.9 cm) Weight: 154 lb 5.2 oz (70 kg) IBW/kg (Calculated) : 77.6 Heparin Dosing Weight: 73 kg  Vital Signs: Temp: 97.7 F (36.5 C) (07/04 0446) Temp Source: Oral (07/04 0446) BP: 105/54 mmHg (07/04 0446) Pulse Rate: 82 (07/04 0446)  Labs:  Recent Labs  11/25/14 1744  11/26/14 0753 11/26/14 1804 11/27/14 0317 11/27/14 1820 11/28/14 0313  HGB  --   --  7.7*  --  7.5*  --  8.3*  HCT  --   --  25.0*  --  23.9*  --  26.2*  PLT  --   --  90*  --  85*  --  67*  LABPROT  --   --  16.4*  --  16.5*  --  16.5*  INR  --   --  1.31  --  1.31  --  1.31  HEPARINUNFRC  --   < > 0.29* 0.34 0.37  --  0.40  CREATININE 4.35*  --  5.35*  --   --  7.78*  --   < > = values in this interval not displayed.  Estimated Creatinine Clearance: 8.9 mL/min (by C-G formula based on Cr of 7.78).   . sodium chloride 10 mL/hr (11/26/14 0006)  . heparin 1,200 Units/hr (11/28/14 0042)    Assessment: 69 y.o. on Coumadin PTA for hx of DVT(March 2016), s/p hematoma evacuation 6/27. Pt on heparin bridge to coumadin. Heparin level therapeutic (0.4) this morning on 1200 units/hr. INR 1.31, Coumadin on hold for possible further vascular intervention. Hgb low but stable. Thrombocytopenia stable. No bleeding noted.  Pt wants to go home tomorrow. Outpt anticoag will be an issue, consider Lovenox?  Goal of Therapy:  Heparin level 0.3 - 0.7 units/ml Monitor platelets by anticoagulation protocol: Yes   Plan:  Cont heparin 1200 unts/hr Daily heparin level F/u with plan for outpt anticoag if discharged  Uvaldo Rising, BCPS  Clinical Pharmacist Pager [phone]  11/28/2014 7:46 AM

## 2014-11-28 NOTE — Progress Notes (Signed)
[facility]                                                                              Patient Demographics  Brandon Sharp, is a 69 y.o. male, DOB - Aug 12, 1945, GDJ:[id number]  Admit date - 11/16/2014   Admitting Physician Erline Hau, MD  Outpatient Primary MD for the patient is [name], MD  LOS - 10   Chief Complaint  Patient presents with  . Abnormal Lab       Brief HPI   69 year old man with significant past medical history for end-stage renal disease on hemodialysis Monday Wednesday Friday, status post DVT on Coumadin, hypertension, diabetes who presented with severe weakness and found to have a hemoglobin 6.5.  On 6/8 he was found to have a hemoglobin of 7.4 and was given 1 unit of PRBCs. His recent history is significant for a right thigh dialysis graft done in March of this year by Dr. Bridgett Larsson. Right groin ultrasound is done in the ED and shows a large hypoechoic structure consistent with a hematoma. S/p vasc surgery with wound vac placement.   Assessment & Plan    Principal Problem:  Symptomatic anemia due to Dehisced R thigh AVG wound, right groin hematoma - due to right groin hematoma due to dehiscence of his AVG status post evacuation and wound VAC by Dr. Bridgett Larsson -Hemoglobin 8.3 after 1 unit packed transfusion yesterday  - No complaints of GI bleeding, will obtain stool occult test. - IV iron and Epogen therapy as per renal service discretion - Wound cultures has been negative, discontinued vancomycin.   Active problems Peripheral arterial disease, right lower extremity arterial insufficiency  Vascular surgery following, patient underwent right leg angiogram Angiogram showed: chronic right superficial femoral artery occlusion with reconstitution of the below-knee popliteal artery. Options would include an additional attempts at recanalization, angioplasty and stenting. Other options would include revascularization with right  femoral to below-knee popliteal bypass - Vein mapping done - Awaiting vascular surgery intervention, bypass versus reattempt perc revascularization   hypoglycemia with type 2 diabetes Blood sugars currently controlled, continue sliding scale insulin Hemoglobin A1c 5.6  DVT, bilateral upper extremity  -Has been on Coumadin since March 2016. -Continue heparin drip, coumadin currently held  Heart transplant -Continue immunosuppressive medications.  End-stage renal disease on hemodialysis -Appreciate nephrology following. Hemodialysis MWF.  Hypertension -Stable and well control will continue home antihypertensive regimen -Patient on HD M-W-F which is also helping volume control  Mild-to-moderate malnutrition Continue nutritional supplements  Code Status: Full code  Family Communication: Discussed in detail with the patient, all imaging results, lab results explained to the patient. Unfortunately, Coumadin can't be restarted if vascular surgery is planned this week and patient has hematoma, currently on wound VAC. He remains on heparin drip while he is inpatient. I discussed in detail with nephrology service and Dr Jonnie Finner is in agreement that it would be in best interest of the patient if the vascular surgery is expedited for LOS and disposition. Coumadin can be restarted after the vascular surgery.  Disposition Plan:   Time Spent in minutes   25 minutes  Procedures  Right  groin hematoma evacuation Angiogram Hemodialysis  Consults   Nephrology Vascular surgery  DVT Prophylaxis  heparin drip  Medications  Scheduled Meds: . sodium chloride   Intravenous Once  . atorvastatin  40 mg Oral Daily  . azaTHIOprine  75 mg Oral q morning - 10a  . calcium acetate  667 mg Oral BID WC  . carvedilol  6.25 mg Oral QHS  . colesevelam  3,750 mg Oral Daily  . darbepoetin (ARANESP) injection - DIALYSIS  200 mcg Intravenous Q Wed-HD  . docusate sodium  100 mg Oral Daily  .  doxercalciferol  4 mcg Intravenous Q M,W,F-HD  . famotidine  10 mg Oral QHS  . feeding supplement (PRO-STAT SUGAR FREE 64)  30 mL Oral BID  . feeding supplement (RESOURCE BREEZE)  1 Container Oral BID BM  . ferric gluconate (FERRLECIT/NULECIT) IV  125 mg Intravenous Q M,W,F-HD  . insulin aspart  0-5 Units Subcutaneous QHS  . insulin aspart  0-9 Units Subcutaneous TID WC  . insulin aspart  3 Units Subcutaneous TID WC  . isosorbide dinitrate  40 mg Oral 3 times per day  . multivitamin  1 tablet Oral QHS  . pantoprazole  40 mg Oral Daily  . predniSONE  5 mg Oral Q breakfast  . sirolimus  1.5 mg Oral Daily   Continuous Infusions: . sodium chloride 10 mL/hr (11/26/14 0006)  . heparin 1,200 Units/hr (11/28/14 0042)   PRN Meds:.acetaminophen **OR** acetaminophen, hydrALAZINE, labetalol, metoprolol, ondansetron, ondansetron **OR** [DISCONTINUED] ondansetron (ZOFRAN) IV, oxyCODONE, senna-docusate, sodium chloride   Antibiotics   Anti-infectives    Start     Dose/Rate Route Frequency Ordered Stop   11/30/14 2200  vancomycin (VANCOCIN) IVPB 750 mg/150 ml premix  Status:  Discontinued     750 mg 150 mL/hr over 60 Minutes Intravenous Every M-W-F (Hemodialysis) 11/28/14 0735 11/28/14 0927   11/28/14 0745  vancomycin (VANCOCIN) IVPB 750 mg/150 ml premix  Status:  Discontinued     750 mg 150 mL/hr over 60 Minutes Intravenous  Once 11/28/14 0735 11/28/14 0927   11/21/14 0921  vancomycin (VANCOCIN) 1 GM/200ML IVPB    Comments:  Elige Ko   : cabinet override      11/21/14 0921 11/21/14 1025   11/18/14 1200  vancomycin (VANCOCIN) IVPB 750 mg/150 ml premix  Status:  Discontinued     750 mg 150 mL/hr over 60 Minutes Intravenous Every M-W-F (Hemodialysis) 11/17/14 0051 11/28/14 0735   11/17/14 0100  vancomycin (VANCOCIN) 1,500 mg in sodium chloride 0.9 % 500 mL IVPB     1,500 mg 250 mL/hr over 120 Minutes Intravenous  Once 11/17/14 0051 11/17/14 0407        Subjective:   Mitch Arquette was seen and examined today. Patient himself denies any specific complaints, hoping that vascular surgery can be expedited tomorrow so he doesn't have to stay here for week. Patient denies dizziness, chest pain, shortness of breath, abdominal pain, N/V/D/C, new weakness, numbess, tingling. No acute events overnight.  Wound VAC on+  Objective:   Blood pressure 105/54, pulse 82, temperature 97.7 F (36.5 C), temperature source Oral, resp. rate 18, height 6' (1.829 m), weight 70 kg (154 lb 5.2 oz), SpO2 100 %.  Wt Readings from Last 3 Encounters:  11/25/14 70 kg (154 lb 5.2 oz)  11/08/14 74.844 kg (165 lb)  10/20/14 72.122 kg (159 lb)     Intake/Output Summary (Last 24 hours) at 11/28/14 1152 Last data filed at 11/27/14 2034  Gross per 24  hour  Intake    557 ml  Output   1700 ml  Net  -1143 ml    Exam  General: Alert and oriented x 3, NAD  HEENT:  PERRLA, EOMI,   Neck: Supple  CVS: S1 S2clear, no mrg,   Respiratory: CTAB  Abdomen: Soft, NT, ND, NBS  Ext: no cyanosis clubbing, 1+ edema, wound VAC to the right groin, bilateral upper extremity edema  Neuro: no new deficits  Skin: No rashes  Psych: Normal affect and demeanor, alert and oriented x3    Data Review   Micro Results Recent Results (from the past 240 hour(s))  Surgical pcr screen     Status: Abnormal   Collection Time: 11/21/14  4:54 AM  Result Value Ref Range Status   MRSA, PCR POSITIVE (A) NEGATIVE Final   Staphylococcus aureus POSITIVE (A) NEGATIVE Final    Comment:        The Xpert SA Assay (FDA approved for NASAL specimens in patients over 65 years of age), is one component of a comprehensive surveillance program.  Test performance has been validated by [facility] for patients greater than or equal to 29 year old. It is not intended to diagnose infection nor to guide or monitor treatment.   Anaerobic culture     Status: None   Collection Time: 11/21/14 10:07 AM  Result Value Ref  Range Status   Specimen Description WOUND GROIN RIGHT  Final   Special Requests RIGHT GROIN HEMATOMA PT ON VANCOMYCIN  Final   Gram Stain   Final    NO WBC SEEN NO SQUAMOUS EPITHELIAL CELLS SEEN NO ORGANISMS SEEN Performed at [facility]    Culture   Final    NO ANAEROBES ISOLATED Performed at [facility]    Report Status 11/26/2014 FINAL  Final  Wound culture     Status: None   Collection Time: 11/21/14 10:07 AM  Result Value Ref Range Status   Specimen Description WOUND GROIN RIGHT  Final   Special Requests RIGHT GROIN HEMATOMA PT ON VANCOMYCIN  Final   Gram Stain   Final    NO WBC SEEN NO SQUAMOUS EPITHELIAL CELLS SEEN NO ORGANISMS SEEN Performed at [facility]    Culture   Final    NO GROWTH 3 DAYS Performed at [facility]    Report Status 11/24/2014 FINAL  Final    Radiology Reports Dg Chest 2 View  11/11/2014   CLINICAL DATA:  Short of breath, low hemoglobin blood transfusion  EXAM: CHEST  2 VIEW  COMPARISON:  08/25/2014  FINDINGS: Vascular stents noted within the subclavian veins. Large-bore left central venous line with tip in the distal SVC. Normal cardiac silhouette. No pulmonary edema. Mild left basilar atelectasis. No pneumothorax.  IMPRESSION: 1. Mild left basilar atelectasis. 2. No edema or infiltrate.   Electronically Signed   By: Suzy Bouchard M.D.   On: 11/11/2014 19:29    CBC  Recent Labs Lab 11/24/14 0408 11/25/14 1632 11/26/14 0753 11/27/14 0317 11/28/14 0313  WBC 3.9* 5.1 4.5 4.7 3.5*  HGB 8.3* 8.0* 7.7* 7.5* 8.3*  HCT 26.3* 25.6* 25.0* 23.9* 26.2*  PLT 57* 85* 90* 85* 67*  MCV 97.8 96.2 96.5 95.6 93.6  MCH 30.9 30.1 29.7 30.0 29.6  MCHC 31.6 31.3 30.8 31.4 31.7  RDW 17.9* 17.9* 18.1* 18.5* 20.2*    Chemistries   Recent Labs Lab 11/23/14 1205 11/24/14 0408 11/25/14 1744 11/26/14 0753 11/27/14 1820  NA 134* 132*  132* 134* 132*  K 3.6 4.4 4.1 4.0 4.5  CL 96* 95* 96* 99* 98*  CO2 30 29 27 27  24   GLUCOSE 130* 88 115* 94 114*  BUN 12 17 19  22* 32*  CREATININE 3.17* 4.49* 4.35* 5.35* 7.78*  CALCIUM 8.0* 8.6* 8.4* 8.8* 9.0   ------------------------------------------------------------------------------------------------------------------ estimated creatinine clearance is 8.9 mL/min (by C-G formula based on Cr of 7.78). ------------------------------------------------------------------------------------------------------------------ No results for input(s): HGBA1C in the last 72 hours. ------------------------------------------------------------------------------------------------------------------ No results for input(s): CHOL, HDL, LDLCALC, TRIG, CHOLHDL, LDLDIRECT in the last 72 hours. ------------------------------------------------------------------------------------------------------------------ No results for input(s): TSH, T4TOTAL, T3FREE, THYROIDAB in the last 72 hours.  Invalid input(s): FREET3 ------------------------------------------------------------------------------------------------------------------ No results for input(s): VITAMINB12, FOLATE, FERRITIN, TIBC, IRON, RETICCTPCT in the last 72 hours.  Coagulation profile  Recent Labs Lab 11/25/14 0430 11/25/14 0705 11/26/14 0753 11/27/14 0317 11/28/14 0313  INR 3.91* 1.22 1.31 1.31 1.31    No results for input(s): DDIMER in the last 72 hours.  Cardiac Enzymes No results for input(s): CKMB, TROPONINI, MYOGLOBIN in the last 168 hours.  Invalid input(s): CK ------------------------------------------------------------------------------------------------------------------ Invalid input(s): [place]  11/27/14 0645 11/27/14 1108 11/27/14 1606 11/27/14 2143 11/28/14 0605 11/28/14 1107  GLUCAP 96 107* 114* 58 78 100*     RAI,RIPUDEEP M.D. [facility] 11/28/2014, 11:52 AM  Pager: [phone]   Between 7am to 7pm - call Pager - [phone]  After 7pm go to [email or web address] - password  [email or web address]  Call night coverage person covering after 7pm

## 2014-11-28 NOTE — Progress Notes (Signed)
[facility] Rounding Note  Subjective:   Pt had HD yesterday to facilitate transfusion (usual is MWF) Post dialysis weight 70 kg (prev EDW 72 - will have lower EDW at discharge) He remains very frustrated with the whole situation re his RLE Has no claudication, no open wounds  Objective Vital signs in last 24 hours: Filed Vitals:   11/27/14 2030 11/27/14 2034 11/27/14 2150 11/28/14 0446  BP: 113/58 132/62 143/58 105/54  Pulse: 93 92 93 82  Temp:  97.3 F (36.3 C) 97.7 F (36.5 C) 97.7 F (36.5 C)  TempSrc:  Oral Oral Oral  Resp: 19 13 18 18   Height:      Weight:      SpO2:  98% 99% 100%   Physical Exam: Gen: Alert OX3 NAD,pleasant   Heart: Regular S1S2 No S3 Lungs: Clear Abdomen: soft NT, ND Extremities: no LE edema. RUE edema 2+ chronic  Access: left IJ perm cath and right thigh AVGG + bruit, R groin wound w/ wound VAC in place   Labs:   Recent Labs Lab 11/23/14 1205  11/25/14 1744 11/26/14 0753 11/27/14 1820  NA 134*  < > 132* 134* 132*  K 3.6  < > 4.1 4.0 4.5  CL 96*  < > 96* 99* 98*  CO2 30  < > 27 27 24   GLUCOSE 130*  < > 115* 94 114*  BUN 12  < > 19 22* 32*  CREATININE 3.17*  < > 4.35* 5.35* 7.78*  CALCIUM 8.0*  < > 8.4* 8.8* 9.0  PHOS 1.5*  --  2.3*  --  3.7  < > = values in this interval not displayed.    Recent Labs Lab 11/23/14 1205 11/25/14 1744 11/27/14 1820  ALBUMIN 2.6* 2.6* 2.5*    Recent Labs Lab 11/24/14 0408 11/25/14 1632 11/26/14 0753 11/27/14 0317 11/28/14 0313  WBC 3.9* 5.1 4.5 4.7 3.5*  HGB 8.3* 8.0* 7.7* 7.5* 8.3*  HCT 26.3* 25.6* 25.0* 23.9* 26.2*  MCV 97.8 96.2 96.5 95.6 93.6  PLT 57* 85* 90* 85* 67*     Recent Labs Lab 11/27/14 0645 11/27/14 1108 11/27/14 1606 11/27/14 2143 11/28/14 0605  GLUCAP 96 107* 114* 85 78    Medications: . sodium chloride 10 mL/hr (11/26/14 0006)  . heparin 1,200 Units/hr (11/28/14 0042)   . sodium chloride   Intravenous Once  . atorvastatin  40 mg Oral  Daily  . azaTHIOprine  75 mg Oral q morning - 10a  . calcium acetate  667 mg Oral BID WC  . carvedilol  6.25 mg Oral QHS  . colesevelam  3,750 mg Oral Daily  . darbepoetin (ARANESP) injection - DIALYSIS  200 mcg Intravenous Q Wed-HD  . docusate sodium  100 mg Oral Daily  . doxercalciferol  4 mcg Intravenous Q M,W,F-HD  . famotidine  10 mg Oral QHS  . feeding supplement (PRO-STAT SUGAR FREE 64)  30 mL Oral BID  . feeding supplement (RESOURCE BREEZE)  1 Container Oral BID BM  . ferric gluconate (FERRLECIT/NULECIT) IV  125 mg Intravenous Q M,W,F-HD  . insulin aspart  0-5 Units Subcutaneous QHS  . insulin aspart  0-9 Units Subcutaneous TID WC  . insulin aspart  3 Units Subcutaneous TID WC  . isosorbide dinitrate  40 mg Oral 3 times per day  . multivitamin  1 tablet Oral QHS  . pantoprazole  40 mg Oral Daily  . predniSONE  5 mg Oral Q breakfast  . sirolimus  1.5 mg  Oral Daily  . vancomycin  750 mg Intravenous Once  . [START ON 11/30/2014] vancomycin  750 mg Intravenous Q M,W,F-HD    Dialysis Orders:  [place]   MWF  4 hours EDW 72 - needs lower EDW at d/c 2K 2 Ca  4 hr  3000 heparin,  left IJ and right fem AVGG inserted 5/26  Hectorol 4  Aranesp 200 q Wed no Fe    Problem/Plan 1. Dehisced R thigh AVG wound w hematoma , s/p hematoma evacuation + wound VAC placement 6/27 - on Vancomycin HD days  Wound culture of groin no growth.   2. RLE arterial insufficiency - s/p angio / awaiting  Dr.Chen input "back in town Wed" per [facility]VS possible bypass vs re-attempt perc solution. No pain, no open wounds. 3. Anemia - multiple etiologies- due to AVG bleed / CKD Anemia/ and immunosuppression meds- s/p transfusion. Hb 8.3> 7.7>7.5. On max Aranesp/getting Fe load. Rec'd HD yesterday to facilitate another transfusion. Today 8.3. 4. ESRD - MWF HD - had Sunday off schedule so could get blood  (next Hd Wed) 5. Metabolic bone disease - ON Hectorol 4. Phos good on reduced ca acetate BID with  meals. 6. Volume/ HTN=: BP stable with HDand low dose coreg/  Edema improved. For HD today. Under dry wt by 2kg, BP's good. New EDW at discharge 7. 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 Coumadin currently on hold/ on iv hep for possible procedure later in the week 8. 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 Dm type 2- per primary service 10. Malnutrition - alb mid 2's - renal diet/vits/supplements 11. Thrombocytopenia- on immunosuppressive meds  [name], MD [place] Pager 11/28/2014, 8:48 AM

## 2014-11-28 NOTE — Care Management Note (Signed)
Case Management Note  Patient Details  Name: Brandon Sharp MRN: [id number] Date of Birth: May 08, 1946  Subjective/Objective:                    Action/Plan:   Expected Discharge Date:                  Expected Discharge Plan:  Home/Self Care  In-House Referral:  Clinical Social Work  Discharge planning Services  CM Consult  Post Acute Care Choice:    Choice offered to:     DME Arranged:    DME Agency:     HH Arranged:    [place] Agency:     Status of Service:  In process, will continue to follow  Medicare Important Message Given:  Yes-second notification given Date Medicare IM Given:    Medicare IM give by:    Date Additional Medicare IM Given:   11/28/2014 Additional Medicare Important Message give by:   Marylyn Ishihara  If discussed at [employer], dates discussed:    Additional Comments:  Dimas Aguas, RN 11/28/2014, 9:19 AM

## 2014-11-29 ENCOUNTER — Encounter (HOSPITAL_COMMUNITY): Payer: Self-pay | Admitting: Vascular Surgery

## 2014-11-29 LAB — PROTIME-INR
INR: 1.21 (ref 0.00–1.49)
Prothrombin Time: 15.5 seconds — ABNORMAL HIGH (ref 11.6–15.2)

## 2014-11-29 LAB — RENAL FUNCTION PANEL
ANION GAP: 10 (ref 5–15)
Albumin: 2.6 g/dL — ABNORMAL LOW (ref 3.5–5.0)
BUN: 25 mg/dL — ABNORMAL HIGH (ref 6–20)
CO2: 26 mmol/L (ref 22–32)
CREATININE: 6.38 mg/dL — AB (ref 0.61–1.24)
Calcium: 9.5 mg/dL (ref 8.9–10.3)
Chloride: 100 mmol/L — ABNORMAL LOW (ref 101–111)
GFR calc Af Amer: 9 mL/min — ABNORMAL LOW (ref >60)
GFR calc non Af Amer: 8 mL/min — ABNORMAL LOW (ref >60)
Glucose, Bld: 96 mg/dL (ref 65–99)
Phosphorus: 3.7 mg/dL (ref 2.5–4.6)
Potassium: 4 mmol/L (ref 3.5–5.1)
SODIUM: 136 mmol/L (ref 135–145)

## 2014-11-29 LAB — CBC
HCT: 27.1 % — ABNORMAL LOW (ref 39.0–52.0)
HEMOGLOBIN: 8.5 g/dL — AB (ref 13.0–17.0)
MCH: 29.4 pg (ref 26.0–34.0)
MCHC: 31.4 g/dL (ref 30.0–36.0)
MCV: 93.8 fL (ref 78.0–100.0)
Platelets: 72 10*3/uL — ABNORMAL LOW (ref 150–400)
RBC: 2.89 MIL/uL — AB (ref 4.22–5.81)
RDW: 19.7 % — ABNORMAL HIGH (ref 11.5–15.5)
WBC: 4.7 10*3/uL (ref 4.0–10.5)

## 2014-11-29 LAB — GLUCOSE, CAPILLARY
Glucose-Capillary: 120 mg/dL — ABNORMAL HIGH (ref 65–99)
Glucose-Capillary: 98 mg/dL (ref 65–99)
Glucose-Capillary: 125 mg/dL — ABNORMAL HIGH (ref 65–99)
Glucose-Capillary: 99 mg/dL (ref 65–99)
Glucose-Capillary: 146 mg/dL — ABNORMAL HIGH (ref 65–99)

## 2014-11-29 LAB — HEPARIN LEVEL (UNFRACTIONATED): HEPARIN UNFRACTIONATED: 0.67 [IU]/mL (ref 0.30–0.70)

## 2014-11-29 NOTE — Progress Notes (Signed)
UR Completed. Arnesha Schiraldi, RN, BSN.  [phone] 

## 2014-11-29 NOTE — Progress Notes (Signed)
ANTICOAGULATION CONSULT NOTE - Follow Up Consult  Pharmacy Consult for Heparin / Coumadin on hold Indication: hx of DVT  Allergies  Allergen Reactions  . Lisinopril Swelling    Lips and tongue swell  . Niacin And Related Other (See Comments)    unknown  . Norvasc [Amlodipine Besylate] Rash    Flushing  . Penicillins Rash    Patient Measurements: Height: 6' (182.9 cm) Weight: 155 lb 1.6 oz (70.353 kg) IBW/kg (Calculated) : 77.6  Vital Signs: Temp: 97.7 F (36.5 C) (07/05 0635) Temp Source: Oral (07/05 0635) BP: 155/59 mmHg (07/05 0635) Pulse Rate: 76 (07/05 0635)  Labs:  Recent Labs  11/27/14 0317 11/27/14 1820 11/28/14 0313 11/29/14 0730  HGB 7.5*  --  8.3* 8.5*  HCT 23.9*  --  26.2* 27.1*  PLT 85*  --  67* PENDING  LABPROT 16.5*  --  16.5* 15.5*  INR 1.31  --  1.31 1.21  HEPARINUNFRC 0.37  --  0.40 0.67  CREATININE  --  7.78*  --  6.38*    Estimated Creatinine Clearance: 10.9 mL/min (by C-G formula based on Cr of 6.38).   Assessment: 69 y.o. on Coumadin PTA for hx of DVT(March 2016), s/p hematoma evacuation 6/27. Pt on heparin bridge to coumadin. Consider outpatient bridge with enoxaparin. Heparin level therapeutic (0.67) this morning on 1200 units/hr but trending up. INR 1.21, Coumadin on hold for possible further vascular intervention. Hgb low but stable. Thrombocytopenia stable. No bleeding noted.  Goal of Therapy:  Heparin level 0.3-0.7 units/ml Monitor platelets by anticoagulation protocol: Yes   Plan:  Decrease heparin gtt to 1,150 units/hr Continue to hold coumadin Monitor daily HL, CBC, s/s of bleed F/u with plan for oupt AC  Saraya Tirey J 11/29/2014,8:32 AM

## 2014-11-29 NOTE — Care Management (Signed)
Important Message  Patient Details  Name: Brandon Sharp MRN: [id number] Date of Birth: Sep 29, 1945   Medicare Important Message Given:  Yes-third notification given    [name], RN 11/29/2014, 11:00 AM

## 2014-11-29 NOTE — Care Management Note (Signed)
Case Management Note  Patient Details  Name: Brandon Sharp MRN: [id number] Date of Birth: June 25, 1945  Subjective/Objective: Pt admitted with symptomatic anemia               Action/Plan:  Pt is from home.  Pt had wound vac placed during this admit. Discharge home plan includes HHRN and wound vac with [employer].  CM will assist and will continue to monitor for disposition   Expected Discharge Date:                  Expected Discharge Plan:  Home/Self Care  In-House Referral:  Clinical Social Work  Discharge planning Services  CM Consult  Post Acute Care Choice:    Choice offered to:  Patient  DME Arranged:    DME Agency:     HH Arranged:  HHRN (wound vac) HH Agency:  [facility]  Status of Service:  In process, will continue to follow  Medicare Important Message Given:  Yes-third notification given Date Medicare IM Given:    Medicare IM give by:    Date Additional Medicare IM Given:    Additional Medicare Important Message give by:     If discussed at [place] of Stay Meetings, dates discussed:    Additional Comments: CM faxed information to [facility], referral was accepted.  CM offered pt choice for [facility], pt chose [facility], referral accepted.  CM will continue to monitor for disposition needs [name], RN 11/29/2014, 2:35 PM

## 2014-11-29 NOTE — Progress Notes (Signed)
[facility]                                                                              Patient Demographics  Brandon Sharp, is a 69 y.o. male, DOB - 23-Feb-1946, MHD:[id number]  Admit date - 11/16/2014   Admitting Physician Brandon Hau, MD  Outpatient Primary MD for the patient is Brandon Noon, MD  LOS - 11   Chief Complaint  Patient presents with  . Abnormal Lab       Brief HPI   69 year old man with significant past medical history for end-stage renal disease on hemodialysis Monday Wednesday Friday, status post DVT on Coumadin, hypertension, diabetes who presented with severe weakness and found to have a hemoglobin 6.5.  On 6/8 he was found to have a hemoglobin of 7.4 and was given 1 unit of PRBCs. His recent history is significant for a right thigh dialysis graft done in March of this year by Brandon. Bridgett Sharp. Right groin ultrasound is done in the ED and shows a large hypoechoic structure consistent with a hematoma. S/p vasc surgery with wound vac placement.   Assessment & Plan    Principal Problem:  Symptomatic anemia due to Dehisced R thigh AVG wound, right groin hematoma - due to right groin hematoma due to dehiscence of his AVG status post evacuation and wound VAC by Brandon. Bridgett Sharp -H&H stable, IV iron and Epogen therapy as per renal service discretion - Wound cultures has been negative, discontinued vancomycin.   Active problems Peripheral arterial disease, right lower extremity arterial insufficiency  Vascular surgery following, patient underwent right leg angiogram Angiogram showed: chronic right superficial femoral artery occlusion with reconstitution of the below-knee popliteal artery. Options would include an additional attempts at recanalization, angioplasty and stenting. Other options would include revascularization with right femoral to below-knee popliteal bypass - Vein mapping done - Awaiting vascular surgery intervention, bypass  versus reattempt perc revascularization, will contact Brandon Brandon Sharp in AM - Unfortunately, Coumadin (for bilateral upper extremity DVT ) can't be restarted if vascular surgery is planned this week and patient has right groin hematoma, currently on wound VAC. He remains on heparin drip while he is inpatient. I discussed in detail with nephrology service and Brandon Sharp is in agreement that it would be in best interest of the patient if the vascular surgery is expedited for LOS and disposition. Coumadin can be restarted after the vascular surgery and patient can be dc home on coumadin. - I have signed the wound VAC prescription and home health can arrange wound VAC at home  hypoglycemia with type 2 diabetes Blood sugars currently controlled, continue sliding scale insulin Hemoglobin A1c 5.6  DVT, bilateral upper extremity  -Has been on Coumadin since March 2016. -Continue heparin drip, coumadin currently held  Heart transplant -Continue immunosuppressive medications.  End-stage renal disease on hemodialysis -Appreciate nephrology following. Hemodialysis MWF.  Hypertension -Stable and well control will continue home antihypertensive regimen -Patient on HD M-W-F which is also helping volume control  Mild-to-moderate malnutrition Continue nutritional supplements  Code Status: Full code  Family Communication: Discussed in detail with the patient, all imaging results, lab results explained  to the patient.   Disposition Plan: Awaiting vascular surgery  Time Spent in minutes   25 minutes  Procedures  Right groin hematoma evacuation Angiogram Hemodialysis  Consults   Nephrology Vascular surgery  DVT Prophylaxis  heparin drip  Medications  Scheduled Meds: . sodium chloride   Intravenous Once  . atorvastatin  40 mg Oral Daily  . azaTHIOprine  75 mg Oral q morning - 10a  . calcium acetate  667 mg Oral BID WC  . carvedilol  6.25 mg Oral QHS  . colesevelam  3,750 mg Oral Daily  .  darbepoetin (ARANESP) injection - DIALYSIS  200 mcg Intravenous Q Wed-HD  . docusate sodium  100 mg Oral Daily  . doxercalciferol  4 mcg Intravenous Q M,W,F-HD  . famotidine  10 mg Oral QHS  . feeding supplement (PRO-STAT SUGAR FREE 64)  30 mL Oral BID  . feeding supplement (RESOURCE BREEZE)  1 Container Oral BID BM  . ferric gluconate (FERRLECIT/NULECIT) IV  125 mg Intravenous Q M,W,F-HD  . insulin aspart  0-5 Units Subcutaneous QHS  . insulin aspart  0-9 Units Subcutaneous TID WC  . isosorbide dinitrate  40 mg Oral 3 times per day  . multivitamin  1 tablet Oral QHS  . pantoprazole  40 mg Oral Daily  . predniSONE  5 mg Oral Q breakfast  . Sirolimus  1.5 mg Oral Daily   Continuous Infusions: . sodium chloride 10 mL/hr (11/26/14 0006)  . heparin 1,150 Units/hr (11/29/14 0953)   PRN Meds:.acetaminophen **OR** acetaminophen, hydrALAZINE, labetalol, metoprolol, ondansetron, ondansetron **OR** [DISCONTINUED] ondansetron (ZOFRAN) IV, oxyCODONE, senna-docusate, sodium chloride   Antibiotics   Anti-infectives    Start     Dose/Rate Route Frequency Ordered Stop   11/30/14 2200  vancomycin (VANCOCIN) IVPB 750 mg/150 ml premix  Status:  Discontinued     750 mg 150 mL/hr over 60 Minutes Intravenous Every M-W-F (Hemodialysis) 11/28/14 0735 11/28/14 0927   11/28/14 0745  vancomycin (VANCOCIN) IVPB 750 mg/150 ml premix  Status:  Discontinued     750 mg 150 mL/hr over 60 Minutes Intravenous  Once 11/28/14 0735 11/28/14 0927   11/21/14 0921  vancomycin (VANCOCIN) 1 GM/200ML IVPB    Comments:  Brandon Sharp   : cabinet override      11/21/14 0921 11/21/14 1025   11/18/14 1200  vancomycin (VANCOCIN) IVPB 750 mg/150 ml premix  Status:  Discontinued     750 mg 150 mL/hr over 60 Minutes Intravenous Every M-W-F (Hemodialysis) 11/17/14 0051 11/28/14 0735   11/17/14 0100  vancomycin (VANCOCIN) 1,500 mg in sodium chloride 0.9 % 500 mL IVPB     1,500 mg 250 mL/hr over 120 Minutes Intravenous  Once  11/17/14 0051 11/17/14 0407        Subjective:   Brandon Sharp was seen and examined today. Hoping for vascular surgery done soon and he can go home. Patient denies dizziness, chest pain, shortness of breath, abdominal pain, N/V/D/C, new weakness, numbess, tingling. No acute events overnight.  Wound VAC on+  Objective:   Blood pressure 155/59, pulse 76, temperature 97.7 F (36.5 C), temperature source Oral, resp. rate 18, height 6' (1.829 m), weight 70.353 kg (155 lb 1.6 oz), SpO2 98 %.  Wt Readings from Last 3 Encounters:  11/29/14 70.353 kg (155 lb 1.6 oz)  11/08/14 74.844 kg (165 lb)  10/20/14 72.122 kg (159 lb)     Intake/Output Summary (Last 24 hours) at 11/29/14 1303 Last data filed at 11/29/14 0823  Gross per  24 hour  Intake    360 ml  Output      0 ml  Net    360 ml    Exam  General: Alert and oriented x 3, NAD  HEENT:  PERRLA, EOMI,   Neck: Supple  CVS: S1 S2clear, no mrg,   Respiratory: CTAB  Abdomen: Soft, NT, ND, NBS  Ext: no cyanosis clubbing, 1+ edema, wound VAC to the right groin, bilateral upper extremity edema  Neuro: no new deficits  Skin: No rashes  Psych: Normal affect and demeanor, alert and oriented x3    Data Review   Micro Results Recent Results (from the past 240 hour(s))  Surgical pcr screen     Status: Abnormal   Collection Time: 11/21/14  4:54 AM  Result Value Ref Range Status   MRSA, PCR POSITIVE (A) NEGATIVE Final   Staphylococcus aureus POSITIVE (A) NEGATIVE Final    Comment:        The Xpert SA Assay (FDA approved for NASAL specimens in patients over 51 years of age), is one component of a comprehensive surveillance program.  Test performance has been validated by [facility] for patients greater than or equal to 58 year old. It is not intended to diagnose infection nor to guide or monitor treatment.   Anaerobic culture     Status: None   Collection Time: 11/21/14 10:07 AM  Result Value Ref Range Status    Specimen Description WOUND GROIN RIGHT  Final   Special Requests RIGHT GROIN HEMATOMA PT ON VANCOMYCIN  Final   Gram Stain   Final    NO WBC SEEN NO SQUAMOUS EPITHELIAL CELLS SEEN NO ORGANISMS SEEN Performed at [facility]    Culture   Final    NO ANAEROBES ISOLATED Performed at [facility]    Report Status 11/26/2014 FINAL  Final  Wound culture     Status: None   Collection Time: 11/21/14 10:07 AM  Result Value Ref Range Status   Specimen Description WOUND GROIN RIGHT  Final   Special Requests RIGHT GROIN HEMATOMA PT ON VANCOMYCIN  Final   Gram Stain   Final    NO WBC SEEN NO SQUAMOUS EPITHELIAL CELLS SEEN NO ORGANISMS SEEN Performed at [facility]    Culture   Final    NO GROWTH 3 DAYS Performed at [facility]    Report Status 11/24/2014 FINAL  Final    Radiology Reports Dg Chest 2 View  11/11/2014   CLINICAL DATA:  Short of breath, low hemoglobin blood transfusion  EXAM: CHEST  2 VIEW  COMPARISON:  08/25/2014  FINDINGS: Vascular stents noted within the subclavian veins. Large-bore left central venous line with tip in the distal SVC. Normal cardiac silhouette. No pulmonary edema. Mild left basilar atelectasis. No pneumothorax.  IMPRESSION: 1. Mild left basilar atelectasis. 2. No edema or infiltrate.   Electronically Signed   By: Suzy Bouchard M.D.   On: 11/11/2014 19:29    CBC  Recent Labs Lab 11/25/14 1632 11/26/14 0753 11/27/14 0317 11/28/14 0313 11/29/14 0730  WBC 5.1 4.5 4.7 3.5* 4.7  HGB 8.0* 7.7* 7.5* 8.3* 8.5*  HCT 25.6* 25.0* 23.9* 26.2* 27.1*  PLT 85* 90* 85* 67* 72*  MCV 96.2 96.5 95.6 93.6 93.8  MCH 30.1 29.7 30.0 29.6 29.4  MCHC 31.3 30.8 31.4 31.7 31.4  RDW 17.9* 18.1* 18.5* 20.2* 19.7*    Chemistries   Recent Labs Lab 11/24/14 0408 11/25/14 1744 11/26/14 0753 11/27/14 1820  11/29/14 0730  NA 132* 132* 134* 132* 136  K 4.4 4.1 4.0 4.5 4.0  CL 95* 96* 99* 98* 100*  CO2 29 27 27 24 26   GLUCOSE 88  115* 94 114* 96  BUN 17 19 22* 32* 25*  CREATININE 4.49* 4.35* 5.35* 7.78* 6.38*  CALCIUM 8.6* 8.4* 8.8* 9.0 9.5   ------------------------------------------------------------------------------------------------------------------ estimated creatinine clearance is 10.9 mL/min (by C-G formula based on Cr of 6.38). ------------------------------------------------------------------------------------------------------------------ No results for input(s): HGBA1C in the last 72 hours. ------------------------------------------------------------------------------------------------------------------ No results for input(s): CHOL, HDL, LDLCALC, TRIG, CHOLHDL, LDLDIRECT in the last 72 hours. ------------------------------------------------------------------------------------------------------------------ No results for input(s): TSH, T4TOTAL, T3FREE, THYROIDAB in the last 72 hours.  Invalid input(s): FREET3 ------------------------------------------------------------------------------------------------------------------ No results for input(s): VITAMINB12, FOLATE, FERRITIN, TIBC, IRON, RETICCTPCT in the last 72 hours.  Coagulation profile  Recent Labs Lab 11/25/14 0705 11/26/14 0753 11/27/14 0317 11/28/14 0313 11/29/14 0730  INR 1.22 1.31 1.31 1.31 1.21    No results for input(s): DDIMER in the last 72 hours.  Cardiac Enzymes No results for input(s): CKMB, TROPONINI, MYOGLOBIN in the last 168 hours.  Invalid input(s): CK ------------------------------------------------------------------------------------------------------------------ Invalid input(s): [place]  11/28/14 0605 11/28/14 1107 11/28/14 1643 11/28/14 2326 11/29/14 0632 11/29/14 1104  GLUCAP 78 100* 97 120* 11 125*     Samanthia Howland M.D. [facility] 11/29/2014, 1:03 PM  Pager: [phone]   Between 7am to 7pm - call Pager - [phone]  After 7pm go to [email or web address] - password [email or web address]  Call night  coverage person covering after 7pm

## 2014-11-29 NOTE — Progress Notes (Signed)
[facility] Rounding Note  Subjective:   Pt had HD yesterday to facilitate transfusion (usual is MWF) Post dialysis weight 70 kg (prev EDW 72 - will have lower EDW at discharge) He remains very frustrated with the whole situation re his RLE Has no claudication, no open wounds  Objective Vital signs in last 24 hours: Filed Vitals:   11/28/14 0446 11/28/14 1500 11/28/14 1934 11/29/14 0635  BP: 105/54 115/55 153/49 155/59  Pulse: 82 80 77 76  Temp: 97.7 F (36.5 C) 97.5 F (36.4 C) 98.3 F (36.8 C) 97.7 F (36.5 C)  TempSrc: Oral Oral Oral Oral  Resp: 18  19 18   Height:      Weight:    70.353 kg (155 lb 1.6 oz)  SpO2: 100% 94% 99% 98%   Physical Exam: Wt 70.3 (last post HD weight also 70) Gen: Alert OX3 NAD,pleasant, depressed looking Heart: Regular S1S2 No S3 Lungs: Clear Abdomen: soft NT, ND Extremities: no LE edema. RUE edema 2+ chronic, LUE 1+  Access: left IJ perm cath and right thigh AVGG + bruit,  R groin wound w/ wound VAC in place   Labs:   Recent Labs Lab 11/25/14 1744 11/26/14 0753 11/27/14 1820 11/29/14 0730  NA 132* 134* 132* 136  K 4.1 4.0 4.5 4.0  CL 96* 99* 98* 100*  CO2 27 27 24 26   GLUCOSE 115* 94 114* 96  BUN 19 22* 32* 25*  CREATININE 4.35* 5.35* 7.78* 6.38*  CALCIUM 8.4* 8.8* 9.0 9.5  PHOS 2.3*  --  3.7 3.7      Recent Labs Lab 11/25/14 1744 11/27/14 1820 11/29/14 0730  ALBUMIN 2.6* 2.5* 2.6*    Recent Labs Lab 11/25/14 1632 11/26/14 0753 11/27/14 0317 11/28/14 0313 11/29/14 0730  WBC 5.1 4.5 4.7 3.5* 4.7  HGB 8.0* 7.7* 7.5* 8.3* 8.5*  HCT 25.6* 25.0* 23.9* 26.2* 27.1*  MCV 96.2 96.5 95.6 93.6 93.8  PLT 85* 90* 85* 67* PENDING     Recent Labs Lab 11/28/14 0605 11/28/14 1107 11/28/14 1643 11/28/14 2326 11/29/14 0632  GLUCAP 78 100* 97 120* 98    Medications: . sodium chloride 10 mL/hr (11/26/14 0006)  . heparin 1,200 Units/hr (11/28/14 2335)   . sodium chloride   Intravenous Once  .  atorvastatin  40 mg Oral Daily  . azaTHIOprine  75 mg Oral q morning - 10a  . calcium acetate  667 mg Oral BID WC  . carvedilol  6.25 mg Oral QHS  . colesevelam  3,750 mg Oral Daily  . darbepoetin (ARANESP) injection - DIALYSIS  200 mcg Intravenous Q Wed-HD  . docusate sodium  100 mg Oral Daily  . doxercalciferol  4 mcg Intravenous Q M,W,F-HD  . famotidine  10 mg Oral QHS  . feeding supplement (PRO-STAT SUGAR FREE 64)  30 mL Oral BID  . feeding supplement (RESOURCE BREEZE)  1 Container Oral BID BM  . ferric gluconate (FERRLECIT/NULECIT) IV  125 mg Intravenous Q M,W,F-HD  . insulin aspart  0-5 Units Subcutaneous QHS  . insulin aspart  0-9 Units Subcutaneous TID WC  . isosorbide dinitrate  40 mg Oral 3 times per day  . multivitamin  1 tablet Oral QHS  . pantoprazole  40 mg Oral Daily  . predniSONE  5 mg Oral Q breakfast  . Sirolimus  1.5 mg Oral Daily    Dialysis Orders:  [place]   MWF  4 hours EDW 72 - needs lower EDW at d/c 2K 2 Ca  4 hr  3000 heparin,  left IJ and right fem AVGG inserted 5/26  Hectorol 4  Aranesp 200 q Wed     Problem/Plan 1. Dehisced R thigh AVG wound w hematoma (remains patent) , s/p hematoma evacuation + wound VAC placement 6/27 - on Vancomycin HD days.  Wound culture of groin no growth.   2. RLE arterial insufficiency - s/p angio awaiting  Dr.Chen's return on Wednesday re management of LE "possible bypass vs re-attempt percutaneous intervention. No pain, no open wounds. 3. Anemia - multiple etiologies- due to AVG bleed / CKD related anemia/ and immunosuppression meds- s/p transfusion 6/22 (2), 7/3 (1). On max Aranesp/getting Fe load. Hb 8.5 stable since PRBC's on 7/3. 4. ESRD - MWF HD - had Sunday off schedule so could get blood  (next Hd Wed). Last post weight 70 kg. 5. Metabolic bone disease - ON Hectorol 4. Phos good on reduced ca acetate BID with meals. 6. Volume/ HTN: BP stable with HD and low dose coreg. Next HD tomorrow. New EDW at  discharge 7. 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 Coumadin currently on hold/ on iv hep for possible procedure later in the week 8. 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 Dm type 2- per primary service 10. Malnutrition - alb mid 2's - renal diet/vits/supplements 11. Thrombocytopenia- chronically less than 12. MRSA PCR + - contact isolation  Brandon Maes, MD [facility] [phone] Pager 11/29/2014, 8:25 AM

## 2014-11-30 DIAGNOSIS — D649 Anemia, unspecified: Secondary | ICD-10-CM

## 2014-11-30 LAB — CBC
HCT: 25.2 % — ABNORMAL LOW (ref 39.0–52.0)
HEMATOCRIT: 26 % — AB (ref 39.0–52.0)
HEMOGLOBIN: 7.9 g/dL — AB (ref 13.0–17.0)
Hemoglobin: 8.1 g/dL — ABNORMAL LOW (ref 13.0–17.0)
MCH: 29 pg (ref 26.0–34.0)
MCH: 29.2 pg (ref 26.0–34.0)
MCHC: 31.2 g/dL (ref 30.0–36.0)
MCHC: 31.3 g/dL (ref 30.0–36.0)
MCV: 93.2 fL (ref 78.0–100.0)
MCV: 93 fL (ref 78.0–100.0)
Platelets: 59 10*3/uL — ABNORMAL LOW (ref 150–400)
Platelets: 64 10*3/uL — ABNORMAL LOW (ref 150–400)
RBC: 2.79 MIL/uL — ABNORMAL LOW (ref 4.22–5.81)
RBC: 2.71 MIL/uL — ABNORMAL LOW (ref 4.22–5.81)
RDW: 19.3 % — ABNORMAL HIGH (ref 11.5–15.5)
RDW: 19.3 % — ABNORMAL HIGH (ref 11.5–15.5)
WBC: 3.9 10*3/uL — AB (ref 4.0–10.5)
WBC: 4 10*3/uL (ref 4.0–10.5)

## 2014-11-30 LAB — GLUCOSE, CAPILLARY
GLUCOSE-CAPILLARY: 106 mg/dL — AB (ref 65–99)
Glucose-Capillary: 130 mg/dL — ABNORMAL HIGH (ref 65–99)
Glucose-Capillary: 112 mg/dL — ABNORMAL HIGH (ref 65–99)
Glucose-Capillary: 103 mg/dL — ABNORMAL HIGH (ref 65–99)

## 2014-11-30 LAB — RENAL FUNCTION PANEL
ALBUMIN: 2.4 g/dL — AB (ref 3.5–5.0)
ANION GAP: 9 (ref 5–15)
BUN: 33 mg/dL — AB (ref 6–20)
CO2: 24 mmol/L (ref 22–32)
Calcium: 9.1 mg/dL (ref 8.9–10.3)
Chloride: 102 mmol/L (ref 101–111)
Creatinine, Ser: 8.18 mg/dL — ABNORMAL HIGH (ref 0.61–1.24)
GFR calc non Af Amer: 6 mL/min — ABNORMAL LOW (ref >60)
GFR, EST AFRICAN AMERICAN: 7 mL/min — AB (ref >60)
GLUCOSE: 118 mg/dL — AB (ref 65–99)
PHOSPHORUS: 4 mg/dL (ref 2.5–4.6)
POTASSIUM: 3.9 mmol/L (ref 3.5–5.1)
Sodium: 135 mmol/L (ref 135–145)

## 2014-11-30 LAB — HEPARIN LEVEL (UNFRACTIONATED): Heparin Unfractionated: 0.46 IU/mL (ref 0.30–0.70)

## 2014-11-30 MED ORDER — DARBEPOETIN ALFA 200 MCG/0.4ML IJ SOSY
PREFILLED_SYRINGE | INTRAMUSCULAR | Status: AC
  Filled 2014-11-30: qty 0.4

## 2014-11-30 MED ORDER — DOXERCALCIFEROL 4 MCG/2ML IV SOLN
INTRAVENOUS | Status: AC
  Filled 2014-11-30: qty 2

## 2014-11-30 MED ORDER — WARFARIN - PHARMACIST DOSING INPATIENT: Freq: Every day | Status: DC

## 2014-11-30 MED ORDER — WARFARIN SODIUM 7.5 MG PO TABS
7.5000 mg | ORAL_TABLET | Freq: Once | ORAL | Status: AC
  Administered 2014-11-30: 7.5 mg via ORAL
  Filled 2014-11-30: qty 1

## 2014-11-30 MED FILL — Heparin Sodium (Porcine) 2 Unit/ML in Sodium Chloride 0.9%: INTRAMUSCULAR | Qty: 1000 | Status: AC

## 2014-11-30 NOTE — Care Management Note (Signed)
Case Management Note  Patient Details  Name: Brandon Sharp MRN: [id number] Date of Birth: 08-11-1945  Subjective/Objective: Pt admitted with symptomatic anemia               Action/Plan:  Pt is from home.  Pt had wound vac placed during this admit. Discharge home plan includes HHRN and wound vac with [employer].  CM will assist and will continue to monitor for disposition   Expected Discharge Date:                  Expected Discharge Plan:  Home/Self Care  In-House Referral:  Clinical Social Work  Discharge planning Services  CM Consult  Post Acute Care Choice:    Choice offered to:  Patient  DME Arranged:    DME Agency:     HH Arranged:  HHRN (wound vac) HH Agency:  [facility]  Status of Service:  In process, will continue to follow  Medicare Important Message Given:  Yes-third notification given Date Medicare IM Given:    Medicare IM give by:    Date Additional Medicare IM Given:    Additional Medicare Important Message give by:     If discussed at [place] of Stay Meetings, dates discussed:    Additional Comments: 11/30/14  CM contacted [employer] resource as follow up to referral, informed of pending discharge.  CM was informed by [facility] resource that due to this pt's zipcode; this referral has been outsourced to [name] [phone].  [employer] requested CM to contact company and verify referral was accepted, CM was told yesterday by resource that referral was already accepted, without questions or concerns).  CM contacted [facility] and was informed that referral information had not been received from [facility], company requested CM fax referral documents.  CM contacted [employer], was assured fax was sent at approximately 3pm 11/29/14, resource stated that she would ensure fax would be resent and she would follow back up with confirmation on fax being sent with CM.  CM faxed [employer] directly with requested documents.  CM followed up with [name] at [facility] scientist) and verified  fax was sent and referral was accepted, company informed of possible discharge tomorrow, will continue to follow and inform [employer] of discharge when known.  11/29/14 CM faxed information to [facility], referral was accepted.  CM offered pt choice for [facility], pt chose [facility], referral accepted.  CM will continue to monitor for disposition needs [name], RN 11/30/2014, 3:11 PM

## 2014-11-30 NOTE — Progress Notes (Signed)
Pt completed HD with no complications. 2.5 L fluid removed. Pt alert, no c/o, vss. Report called and pt returned safely to room. CCMD aware.

## 2014-11-30 NOTE — Progress Notes (Signed)
   Daily Progress Note  Assessment/Planning: s/p R groin hematoma evacuation, VAC placement, s/p R thigh AVG, R leg runoff   Discussed options with the patient: observation vs R fem-pop bypass with PTFE vs ligation of right thigh AVG  Patient elected observation at this point.  We discussed signs of critical limb ischemia and patient know to contact me ASAP if he develops any signs  Unfortunately, his L leg also has significant tibial disease, so his L leg would be at risk for gangrene from steal also.  Continue Wound VAC: M-W-F  Can see patient in the office in 2 weeks  Will continue to follow longitudinally  Ok to start Coumadin   Subjective  - 5 Days Post-Op  No complaints, no R foot pain  Objective Filed Vitals:   11/30/14 1200 11/30/14 1230 11/30/14 1320 11/30/14 1415  BP: 101/58 101/61 140/64 163/64  Pulse: 88 91 89   Temp:   98 F (36.7 C)   TempSrc:   Oral   Resp: 16 16 17    Height:      Weight:   153 lb 14.1 oz (69.8 kg)   SpO2:   100%     Intake/Output Summary (Last 24 hours) at 11/30/14 1517 Last data filed at 11/30/14 1320  Gross per 24 hour  Intake    240 ml  Output   2500 ml  Net  -2260 ml    VASC  R foot no wound, no ischemic skin, R groin VAC adherent, VAC drainage c/w liquified hematoma  Laboratory CBC    Component Value Date/Time   WBC 4.0 11/30/2014 1000   WBC 4.5 02/13/2012 1314   HGB 7.9* 11/30/2014 1000   HGB 11.6* 02/13/2012 1314   HCT 25.2* 11/30/2014 1000   HCT 36.1* 02/13/2012 1314   PLT 64* 11/30/2014 1000   PLT 76* 02/13/2012 1314    BMET    Component Value Date/Time   NA 135 11/30/2014 1000   K 3.9 11/30/2014 1000   CL 102 11/30/2014 1000   CO2 24 11/30/2014 1000   GLUCOSE 118* 11/30/2014 1000   BUN 33* 11/30/2014 1000   CREATININE 8.18* 11/30/2014 1000   CALCIUM 9.1 11/30/2014 1000   GFRNONAA 6* 11/30/2014 1000   GFRAA 7* 11/30/2014 Fowler, MD [facility]: [phone] Pager: [phone]  11/30/2014, 3:17 PM

## 2014-11-30 NOTE — Progress Notes (Addendum)
ANTICOAGULATION CONSULT NOTE - Follow Up Consult  Pharmacy Consult for Heparin / Coumadin on hold Indication: Hx of DVT  Allergies  Allergen Reactions  . Lisinopril Swelling    Lips and tongue swell  . Niacin And Related Other (See Comments)    unknown  . Norvasc [Amlodipine Besylate] Rash    Flushing  . Penicillins Rash    Patient Measurements: Height: 6' (182.9 cm) Weight: 155 lb 1.6 oz (70.353 kg) IBW/kg (Calculated) : 77.6  Vital Signs: Temp: 98 F (36.7 C) (07/06 0426) Temp Source: Oral (07/06 0426) BP: 116/47 mmHg (07/06 0426) Pulse Rate: 82 (07/06 0426)  Labs:  Recent Labs  11/27/14 1820  11/28/14 0313 11/29/14 0730 11/30/14 0355  HGB  --   < > 8.3* 8.5* 8.1*  HCT  --   --  26.2* 27.1* 26.0*  PLT  --   --  67* 72* 59*  LABPROT  --   --  16.5* 15.5*  --   INR  --   --  1.31 1.21  --   HEPARINUNFRC  --   --  0.40 0.67 0.46  CREATININE 7.78*  --   --  6.38*  --   < > = values in this interval not displayed.  Estimated Creatinine Clearance: 10.9 mL/min (by C-G formula based on Cr of 6.38).  Assessment: 69 y.o. on Coumadin PTA for hx of DVT(March 2016), s/p hematoma evacuation 6/27. 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 Coumadin currently on hold awaiting vascular intervention and then could be restarted post surgery. HL is currently therapeutic at 0.46. Hgb low but stable. Thrombocytopenia stable. No bleeding noted.  Goal of Therapy:  Heparin level 0.3-0.7 units/ml Monitor platelets by anticoagulation protocol: Yes   Plan:  Continue heparin gtt at 1,150 units/hr Continue to hold coumadin Monitor daily HL, CBC, s/s of bleed F/u with plan for bridge / oupt AC  [place] Jaeger J 11/30/2014,8:18 AM  Vascular and patient discussed options and have elected to go with observation over R fem-pop bypass or ligation of R thigh AVG as of right now. Pharmacy to restart coumadin and bridge with heparin. Pt was taking 5mg  daily PTA. INR yesterday  morning was 1.21. Hgb down to 7.9 today and plts chronically low at 64.  Plan: Continue heparin gtt at 1.150 units/hr Give coumadin 7.5mg  PO x 1  Monitor daily INR, HL, CBC, s/s of bleed

## 2014-11-30 NOTE — Progress Notes (Signed)
Nutrition Follow-up  DOCUMENTATION CODES:  Not applicable  INTERVENTION:   Continue Resource Breeze po BID, each supplement provides 250 kcal and 9 grams of protein  Continue Prostat liquid protein po 30 ml BID with meals, each supplement provides 100 kcal, 15 grams protein  NUTRITION DIAGNOSIS:  Increased nutrient needs related to wound healing as evidenced by estimated needs, ongoing  GOAL:  Patient will meet greater than or equal to 90% of their needs, progressing  MONITOR:  PO intake, Supplement acceptance, Weight trends, Labs, I & O's  ASSESSMENT: Pt with significant past medical history for end-stage renal disease on HD, status post DVT on Coumadin, hypertension, diabetes who presents today with severe weakness and found to have a hemoglobin 6.5. S/p R thigh AVG complicated with hematoma and R groin wound complication.  S/p PROCEDURE on 11/21/14: 1. Right groin hematoma evacuation 2. Placement of negative pressure dressing  Pt currently in HEMODIALYSIS.  Continues on a Renal/Carbohydrate Modified diet.  PO intake 50-80% per flowsheet records.  Increased nutrient needs ongoing.  Pt receiving Boost Breeze and Prostat liquid protein supplements.  Height:  Ht Readings from Last 1 Encounters:  11/16/14 6' (1.829 m)    Weight:  Wt Readings from Last 1 Encounters:  11/30/14 159 lb 9.8 oz (72.4 kg)    Ideal Body Weight:  81 kg  Wt Readings from Last 10 Encounters:  11/30/14 159 lb 9.8 oz (72.4 kg)  11/08/14 165 lb (74.844 kg)  10/20/14 159 lb (72.122 kg)  10/11/14 161 lb (73.029 kg)  09/29/14 161 lb 9.6 oz (73.301 kg)  08/29/14 158 lb 15.2 oz (72.1 kg)  06/21/14 159 lb (72.122 kg)  05/11/14 154 lb 1.6 oz (69.9 kg)  05/03/14 158 lb 8 oz (71.895 kg)  04/26/14 163 lb (73.936 kg)    BMI:  Body mass index is 21.64 kg/(m^2).  Estimated Nutritional Needs:  Kcal:  2100-2300  Protein:  110-120 grams  Fluid:  >1.5 L  Skin:  Wound (see comment) (wound vac to  rt groin)  Diet Order:  Diet renal/carb modified with fluid restriction Diet-HS Snack?: Nothing; Room service appropriate?: Yes; Fluid consistency:: Thin  EDUCATION NEEDS:  No education needs identified at this time   Intake/Output Summary (Last 24 hours) at 11/30/14 1241 Last data filed at 11/30/14 0732  Gross per 24 hour  Intake    240 ml  Output      0 ml  Net    240 ml    Last BM:  7/1  Arthur Holms, RD, LDN Pager #: [phone] After-Hours Pager #: [phone]

## 2014-11-30 NOTE — Progress Notes (Addendum)
   Daily Progress Note  Vein conduit appears inadequate on vein mapping.  After reviewing the R leg runoff once again, this patient has two CTO in the distal SFA with high grade pop artery stenosis which is NOT likely to be responsive to endovascular intervention.  Will need to discuss: Observation vs. R fem-pop with prosthetic vs. thigh AVG removal.   Adele Barthel, MD [facility] Office: [phone] Pager: [phone]  11/30/2014, 8:28 AM

## 2014-11-30 NOTE — Progress Notes (Signed)
[facility]                                                                              Patient Demographics  Brandon Sharp, is a 69 y.o. male, DOB - [birth year], VXB:[id number]  Admit date - 11/16/2014   Admitting Physician Erline Hau, MD  Outpatient Primary MD for the patient is [name], MD  LOS - 12   Chief Complaint  Patient presents with  . Abnormal Lab       Brief HPI   69 year old man with significant past medical history for end-stage renal disease on hemodialysis Monday Wednesday Friday, status post DVT on Coumadin, hypertension, diabetes who presented with severe weakness and found to have a hemoglobin 6.5.  On 6/8 he was found to have a hemoglobin of 7.4 and was given 1 unit of PRBCs. His recent history is significant for a right thigh dialysis graft done in March of this year by Dr. Bridgett Larsson. Right groin ultrasound is done in the ED and shows a large hypoechoic structure consistent with a hematoma. S/p vasc surgery with wound vac placement.   Assessment & Plan    Principal Problem:  Symptomatic anemia due to Dehisced R thigh AVG wound, right groin hematoma - due to right groin hematoma due to dehiscence of his AVG status post evacuation and wound VAC by Dr. Bridgett Larsson -H&H stable, IV iron and Epogen therapy as per renal service discretion - Wound cultures has been negative, discontinued vancomycin.   Active problems Peripheral arterial disease, right lower extremity arterial insufficiency  Vascular surgery following, patient underwent right leg angiogram Angiogram showed: chronic right superficial femoral artery occlusion with reconstitution of the below-knee popliteal artery. - Vein mapping done - Vascular surgery discussed option with the patient, which includes observation versus R fem-pop bypass with PTFE vs ligation of right thigh AVG, patient elected observation and this point. - Giving no surgical intervention anticipated,  will resume back on warfarin, will be bridged with heparin gtt. meanwhile - Unfortunately, Coumadin (for bilateral upper extremity DVT ) can't be restarted if vascular surgery is planned this week and patient has right groin hematoma, currently on wound VAC. He remains on heparin drip while he is inpatient. I discussed in detail with nephrology service and Dr Jonnie Finner is in agreement that it would be in best interest of the patient if the vascular surgery is expedited for LOS and disposition. Coumadin can be restarted after the vascular surgery and patient can be dc home on coumadin. - wound VAC prescription has been signed and home health can arrange wound VAC at home  hypoglycemia with type 2 diabetes Blood sugars currently controlled, continue sliding scale insulin Hemoglobin A1c 5.6  DVT, bilateral upper extremity  -Has been on Coumadin since March 2016. -Continue heparin drip, coumadin has been resumed  Heart transplant -Continue immunosuppressive medications.  End-stage renal disease on hemodialysis -Appreciate nephrology following. Hemodialysis MWF.  Hypertension -Stable and well control will continue home antihypertensive regimen -Patient on HD M-W-F which is also helping volume control  Mild-to-moderate malnutrition Continue nutritional supplements  Code Status: Full code  Family Communication: Discussed with wife at  bedside   Disposition Plan: Once INR is therapeutic, and patient is off heparin drip  Time Spent in minutes   25 minutes  Procedures  Right groin hematoma evacuation Angiogram Hemodialysis  Consults   Nephrology Vascular surgery  DVT Prophylaxis  heparin drip  Medications  Scheduled Meds: . sodium chloride   Intravenous Once  . atorvastatin  40 mg Oral Daily  . azaTHIOprine  75 mg Oral q morning - 10a  . calcium acetate  667 mg Oral BID WC  . carvedilol  6.25 mg Oral QHS  . colesevelam  3,750 mg Oral Daily  . Darbepoetin Alfa      .  darbepoetin (ARANESP) injection - DIALYSIS  200 mcg Intravenous Q Wed-HD  . docusate sodium  100 mg Oral Daily  . doxercalciferol      . doxercalciferol  4 mcg Intravenous Q M,W,F-HD  . famotidine  10 mg Oral QHS  . feeding supplement (PRO-STAT SUGAR FREE 64)  30 mL Oral BID  . feeding supplement (RESOURCE BREEZE)  1 Container Oral BID BM  . insulin aspart  0-5 Units Subcutaneous QHS  . insulin aspart  0-9 Units Subcutaneous TID WC  . isosorbide dinitrate  40 mg Oral 3 times per day  . multivitamin  1 tablet Oral QHS  . pantoprazole  40 mg Oral Daily  . predniSONE  5 mg Oral Q breakfast  . Sirolimus  1.5 mg Oral Daily  . warfarin  7.5 mg Oral ONCE-1800  . Warfarin - Pharmacist Dosing Inpatient   Does not apply q1800   Continuous Infusions: . sodium chloride 10 mL/hr (11/26/14 0006)  . heparin 1,150 Units/hr (11/30/14 0053)   PRN Meds:.acetaminophen **OR** acetaminophen, hydrALAZINE, labetalol, metoprolol, ondansetron, ondansetron **OR** [DISCONTINUED] ondansetron (ZOFRAN) IV, oxyCODONE, senna-docusate, sodium chloride   Antibiotics   Anti-infectives    Start     Dose/Rate Route Frequency Ordered Stop   11/30/14 2200  vancomycin (VANCOCIN) IVPB 750 mg/150 ml premix  Status:  Discontinued     750 mg 150 mL/hr over 60 Minutes Intravenous Every M-W-F (Hemodialysis) 11/28/14 0735 11/28/14 0927   11/28/14 0745  vancomycin (VANCOCIN) IVPB 750 mg/150 ml premix  Status:  Discontinued     750 mg 150 mL/hr over 60 Minutes Intravenous  Once 11/28/14 0735 11/28/14 0927   11/21/14 0921  vancomycin (VANCOCIN) 1 GM/200ML IVPB    Comments:  Elige Ko   : cabinet override      11/21/14 0921 11/21/14 1025   11/18/14 1200  vancomycin (VANCOCIN) IVPB 750 mg/150 ml premix  Status:  Discontinued     750 mg 150 mL/hr over 60 Minutes Intravenous Every M-W-F (Hemodialysis) 11/17/14 0051 11/28/14 0735   11/17/14 0100  vancomycin (VANCOCIN) 1,500 mg in sodium chloride 0.9 % 500 mL IVPB     1,500  mg 250 mL/hr over 120 Minutes Intravenous  Once 11/17/14 0051 11/17/14 0407        Subjective:   Sherman Donaldson was seen and examined today. . Patient denies dizziness, chest pain, shortness of breath, abdominal pain, N/V/D/C, new weakness, numbess, tingling. No acute events overnight.  Wound VAC on+  Objective:   Blood pressure 163/64, pulse 89, temperature 98 F (36.7 C), temperature source Oral, resp. rate 17, height 6' (1.829 m), weight 69.8 kg (153 lb 14.1 oz), SpO2 100 %.  Wt Readings from Last 3 Encounters:  11/30/14 69.8 kg (153 lb 14.1 oz)  11/08/14 74.844 kg (165 lb)  10/20/14 72.122 kg (159 lb)  Intake/Output Summary (Last 24 hours) at 11/30/14 1604 Last data filed at 11/30/14 1320  Gross per 24 hour  Intake    240 ml  Output   2500 ml  Net  -2260 ml    Exam  General: Alert and oriented x 3, NAD  HEENT:  PERRLA, EOMI,   Neck: Supple  CVS: S1 S2clear, no mrg,   Respiratory: CTAB  Abdomen: Soft, NT, ND, NBS  Ext: no cyanosis clubbing, 1+ edema, wound VAC to the right groin, bilateral upper extremity edema  Neuro: no new deficits  Skin: No rashes  Psych: Normal affect and demeanor, alert and oriented x3    Data Review   Micro Results Recent Results (from the past 240 hour(s))  Surgical pcr screen     Status: Abnormal   Collection Time: 11/21/14  4:54 AM  Result Value Ref Range Status   MRSA, PCR POSITIVE (A) NEGATIVE Final   Staphylococcus aureus POSITIVE (A) NEGATIVE Final    Comment:        The Xpert SA Assay (FDA approved for NASAL specimens in patients over 36 years of age), is one component of a comprehensive surveillance program.  Test performance has been validated by [facility] for patients greater than or equal to 79 year old. It is not intended to diagnose infection nor to guide or monitor treatment.   Anaerobic culture     Status: None   Collection Time: 11/21/14 10:07 AM  Result Value Ref Range Status   Specimen  Description WOUND GROIN RIGHT  Final   Special Requests RIGHT GROIN HEMATOMA PT ON VANCOMYCIN  Final   Gram Stain   Final    NO WBC SEEN NO SQUAMOUS EPITHELIAL CELLS SEEN NO ORGANISMS SEEN Performed at [facility]    Culture   Final    NO ANAEROBES ISOLATED Performed at [facility]    Report Status 11/26/2014 FINAL  Final  Wound culture     Status: None   Collection Time: 11/21/14 10:07 AM  Result Value Ref Range Status   Specimen Description WOUND GROIN RIGHT  Final   Special Requests RIGHT GROIN HEMATOMA PT ON VANCOMYCIN  Final   Gram Stain   Final    NO WBC SEEN NO SQUAMOUS EPITHELIAL CELLS SEEN NO ORGANISMS SEEN Performed at [facility]    Culture   Final    NO GROWTH 3 DAYS Performed at [facility]    Report Status 11/24/2014 FINAL  Final    Radiology Reports Dg Chest 2 View  11/11/2014   CLINICAL DATA:  Short of breath, low hemoglobin blood transfusion  EXAM: CHEST  2 VIEW  COMPARISON:  08/25/2014  FINDINGS: Vascular stents noted within the subclavian veins. Large-bore left central venous line with tip in the distal SVC. Normal cardiac silhouette. No pulmonary edema. Mild left basilar atelectasis. No pneumothorax.  IMPRESSION: 1. Mild left basilar atelectasis. 2. No edema or infiltrate.   Electronically Signed   By: Suzy Bouchard M.D.   On: 11/11/2014 19:29    CBC  Recent Labs Lab 11/27/14 0317 11/28/14 0313 11/29/14 0730 11/30/14 0355 11/30/14 1000  WBC 4.7 3.5* 4.7 3.9* 4.0  HGB 7.5* 8.3* 8.5* 8.1* 7.9*  HCT 23.9* 26.2* 27.1* 26.0* 25.2*  PLT 85* 67* 72* 59* 64*  MCV 95.6 93.6 93.8 93.2 93.0  MCH 30.0 29.6 29.4 29.0 29.2  MCHC 31.4 31.7 31.4 31.2 31.3  RDW 18.5* 20.2* 19.7* 19.3* 19.3*    Chemistries  Recent Labs Lab 11/25/14 1744 11/26/14 0753 11/27/14 1820 11/29/14 0730 11/30/14 1000  NA 132* 134* 132* 136 135  K 4.1 4.0 4.5 4.0 3.9  CL 96* 99* 98* 100* 102  CO2 27 27 24 26 24   GLUCOSE 115* 94 114*  96 118*  BUN 19 22* 32* 25* 33*  CREATININE 4.35* 5.35* 7.78* 6.38* 8.18*  CALCIUM 8.4* 8.8* 9.0 9.5 9.1   ------------------------------------------------------------------------------------------------------------------ estimated creatinine clearance is 8.4 mL/min (by C-G formula based on Cr of 8.18). ------------------------------------------------------------------------------------------------------------------ No results for input(s): HGBA1C in the last 72 hours. ------------------------------------------------------------------------------------------------------------------ No results for input(s): CHOL, HDL, LDLCALC, TRIG, CHOLHDL, LDLDIRECT in the last 72 hours. ------------------------------------------------------------------------------------------------------------------ No results for input(s): TSH, T4TOTAL, T3FREE, THYROIDAB in the last 72 hours.  Invalid input(s): FREET3 ------------------------------------------------------------------------------------------------------------------ No results for input(s): VITAMINB12, FOLATE, FERRITIN, TIBC, IRON, RETICCTPCT in the last 72 hours.  Coagulation profile  Recent Labs Lab 11/25/14 0705 11/26/14 0753 11/27/14 0317 11/28/14 0313 11/29/14 0730  INR 1.22 1.31 1.31 1.31 1.21    No results for input(s): DDIMER in the last 72 hours.  Cardiac Enzymes No results for input(s): CKMB, TROPONINI, MYOGLOBIN in the last 168 hours.  Invalid input(s): CK ------------------------------------------------------------------------------------------------------------------ Invalid input(s): [place]  11/29/14 0632 11/29/14 1104 11/29/14 1707 11/29/14 2115 11/30/14 0008 11/30/14 0621  GLUCAP 98 125* 99 146* 130* 112*     Walterine Amodei M.D. [facility] 11/30/2014, 4:04 PM  Pager: [phone]  Between 7am to 7pm - call Pager - [phone]  After 7pm go to [email or web address] - password [email or web address]  Call night  coverage person covering after 7pm

## 2014-11-30 NOTE — Progress Notes (Signed)
[facility] Rounding Note Subjective:    Resting in HD, no complaints  Objective Filed Vitals:   11/30/14 0426 11/30/14 0911 11/30/14 0916 11/30/14 0930  BP: 116/47 147/63 124/60   Pulse: 82 79 77 79  Temp: 98 F (36.7 C) 98.1 F (36.7 C)    TempSrc: Oral Oral    Resp:  17 25 14   Height:      Weight:  72.4 kg (159 lb 9.8 oz)    SpO2: 100% 100%     Physical Exam General: alert and oriented, no acute distress.  Heart: RRR Lungs: CTA, unlabored  Abdomen: soft, nontender +BS Extremities: no LE edema. RUA chronic edema  Dialysis Access: L IJ. R thigh AVG with wound vac  Dialysis Orders:  [facility]  MWF 4 hours EDW 72 - needs lower EDW at d/c 2K 2 Ca  4 hr  3000 heparin,  left IJ and right fem AVGG inserted 5/26  Hectorol 4  Aranesp 200 q Wed no Fe  Assessment/Plan: 1. 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 Wound culture of groin no growth.  2. RLE arterial insufficiency - s/p angio / per Dr.Chen- not likely to be responsive to endovascular intervention- Observation vs. R fem-pop with prosthetic vs. thigh AVG removal. Pt states he is unaware of Dr. Lianne Moris plan - I think [facility] needs to inform him of what they are thinking 3. Anemia - multiple etiologies- due to AVG bleed / CKD Anemia/ and immunosuppression meds- s/p transfusion. Hb 8.3> 7.7>7.5. On max Aranesp/getting Fe load. Today 8.1 4. ESRD - MWF HD - had Sunday off schedule so could get blood- HD today 5. Metabolic bone disease - ON Hectorol 4. Phos 3.7 - reduced ca acetate BID with meals. 6. Volume/ HTN: BP stable with HDand low dose coreg/  Edema improved. HD today with wt 72.4 pre HD 7. 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 Coumadin currently on hold and remains on heparin drip  8. 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 Dm type 2- per primary  service 10. Malnutrition - alb mid 2's - renal diet/vits/supplements 11. Thrombocytopenia- on immunosuppressive meds  Shelle Iron, NP [place] [phone] 11/30/2014,10:04 AM  LOS: 12 days   I have seen and examined this patient and agree with plan as outlined in the above note with the highlighted additions. Apparently Dr. Bridgett Larsson has not relayed his plan to the pt and I think it is more his place than mine to update Mr. Bursch. Mannie Ohlin B,MD 11/30/2014 12:00 PM  Additional Objective Labs: Basic Metabolic Panel:  Recent Labs Lab 11/25/14 1744 11/26/14 0753 11/27/14 1820 11/29/14 0730  NA 132* 134* 132* 136  K 4.1 4.0 4.5 4.0  CL 96* 99* 98* 100*  CO2 27 27 24 26   GLUCOSE 115* 94 114* 96  BUN 19 22* 32* 25*  CREATININE 4.35* 5.35* 7.78* 6.38*  CALCIUM 8.4* 8.8* 9.0 9.5  PHOS 2.3*  --  3.7 3.7   Liver Function Tests:  Recent Labs Lab 11/25/14 1744 11/27/14 1820 11/29/14 0730  ALBUMIN 2.6* 2.5* 2.6*   No results for input(s): LIPASE, AMYLASE in the last 168 hours. CBC:  Recent Labs Lab 11/26/14 0753 11/27/14 0317 11/28/14 0313 11/29/14 0730 11/30/14 0355  WBC 4.5 4.7 3.5* 4.7 3.9*  HGB 7.7* 7.5* 8.3* 8.5* 8.1*  HCT 25.0* 23.9* 26.2* 27.1* 26.0*  MCV 96.5 95.6 93.6 93.8 93.2  PLT 90* 85* 67* 72* 59*   Blood Culture    Component Value Date/Time   SDES WOUND GROIN RIGHT 11/21/2014 1007   SDES WOUND GROIN RIGHT 11/21/2014 1007   SPECREQUEST RIGHT GROIN HEMATOMA PT ON VANCOMYCIN 11/21/2014 1007   SPECREQUEST RIGHT GROIN HEMATOMA PT ON VANCOMYCIN 11/21/2014 1007   CULT  11/21/2014 1007    NO ANAEROBES ISOLATED Performed at [facility]  11/21/2014 1007    NO GROWTH 3 DAYS Performed at [place] 11/26/2014 FINAL 11/21/2014 1007   REPTSTATUS 11/24/2014 FINAL 11/21/2014 1007    Cardiac Enzymes: No results for input(s): CKTOTAL, CKMB, CKMBINDEX, TROPONINI in the last 168  hours. CBG:  Recent Labs Lab 11/29/14 1104 11/29/14 1707 11/29/14 2115 11/30/14 0008 11/30/14 0621  GLUCAP 125* 99 146* 130* 112*   Iron Studies: No results for input(s): IRON, TIBC, TRANSFERRIN, FERRITIN in the last 72 hours. @lablastinr3 @ Studies/Results: No results found. Medications: . sodium chloride 10 mL/hr (11/26/14 0006)  . heparin 1,150 Units/hr (11/30/14 0053)   . sodium chloride   Intravenous Once  . atorvastatin  40 mg Oral Daily  . azaTHIOprine  75 mg Oral q morning - 10a  . calcium acetate  667 mg Oral BID WC  . carvedilol  6.25 mg Oral QHS  . colesevelam  3,750 mg Oral Daily  . darbepoetin (ARANESP) injection - DIALYSIS  200 mcg Intravenous Q Wed-HD  . docusate sodium  100 mg Oral Daily  . doxercalciferol  4 mcg Intravenous Q M,W,F-HD  . famotidine  10 mg Oral QHS  . feeding supplement (PRO-STAT SUGAR FREE 64)  30 mL Oral BID  . feeding supplement (RESOURCE BREEZE)  1 Container Oral BID BM  . ferric gluconate (FERRLECIT/NULECIT) IV  125 mg Intravenous Q M,W,F-HD  . insulin aspart  0-5 Units Subcutaneous QHS  . insulin aspart  0-9 Units Subcutaneous TID WC  . isosorbide dinitrate  40 mg Oral 3 times per day  . multivitamin  1 tablet Oral QHS  . pantoprazole  40 mg Oral Daily  . predniSONE  5 mg Oral Q breakfast  . Sirolimus  1.5 mg Oral Daily

## 2014-12-01 DIAGNOSIS — N186 End stage renal disease: Secondary | ICD-10-CM

## 2014-12-01 DIAGNOSIS — E1121 Type 2 diabetes mellitus with diabetic nephropathy: Secondary | ICD-10-CM

## 2014-12-01 DIAGNOSIS — R5381 Other malaise: Secondary | ICD-10-CM

## 2014-12-01 DIAGNOSIS — Z992 Dependence on renal dialysis: Secondary | ICD-10-CM

## 2014-12-01 DIAGNOSIS — R269 Unspecified abnormalities of gait and mobility: Secondary | ICD-10-CM

## 2014-12-01 LAB — CBC
HCT: 25.1 % — ABNORMAL LOW (ref 39.0–52.0)
Hemoglobin: 7.9 g/dL — ABNORMAL LOW (ref 13.0–17.0)
MCH: 29.4 pg (ref 26.0–34.0)
MCHC: 31.5 g/dL (ref 30.0–36.0)
MCV: 93.3 fL (ref 78.0–100.0)
PLATELETS: 54 10*3/uL — AB (ref 150–400)
RBC: 2.69 MIL/uL — ABNORMAL LOW (ref 4.22–5.81)
RDW: 19 % — AB (ref 11.5–15.5)
WBC: 3.9 10*3/uL — ABNORMAL LOW (ref 4.0–10.5)

## 2014-12-01 LAB — GLUCOSE, CAPILLARY
GLUCOSE-CAPILLARY: 121 mg/dL — AB (ref 65–99)
GLUCOSE-CAPILLARY: 133 mg/dL — AB (ref 65–99)
Glucose-Capillary: 94 mg/dL (ref 65–99)
Glucose-Capillary: 104 mg/dL — ABNORMAL HIGH (ref 65–99)

## 2014-12-01 LAB — HEPARIN LEVEL (UNFRACTIONATED): Heparin Unfractionated: 0.41 IU/mL (ref 0.30–0.70)

## 2014-12-01 LAB — PROTIME-INR
INR: 1.32 (ref 0.00–1.49)
PROTHROMBIN TIME: 16.5 s — AB (ref 11.6–15.2)

## 2014-12-01 MED ORDER — BISACODYL 5 MG PO TBEC
10.0000 mg | DELAYED_RELEASE_TABLET | Freq: Every day | ORAL | Status: DC
Start: 2014-12-01 — End: 2014-12-02
  Administered 2014-12-01 – 2014-12-02 (×2): 10 mg via ORAL
  Filled 2014-12-01 (×2): qty 2

## 2014-12-01 MED ORDER — WARFARIN SODIUM 5 MG PO TABS
5.0000 mg | ORAL_TABLET | Freq: Once | ORAL | Status: AC
  Administered 2014-12-01: 5 mg via ORAL
  Filled 2014-12-01: qty 1

## 2014-12-01 MED ORDER — POLYETHYLENE GLYCOL 3350 17 G PO PACK
17.0000 g | PACK | Freq: Two times a day (BID) | ORAL | Status: DC
  Administered 2014-12-01 – 2014-12-02 (×3): 17 g via ORAL
  Filled 2014-12-01 (×4): qty 1

## 2014-12-01 NOTE — Evaluation (Signed)
Physical Therapy Evaluation Patient Details Name: Brandon Sharp MRN: [id number] DOB: Dec 04, 1945 Today's Date: 12/01/2014   History of Present Illness  69 year old man with significant past medical history for end-stage renal disease on hemodialysis Monday Wednesday Friday, status post DVT on Coumadin, hypertension, diabetes who presented with severe weakness and found to have a hemoglobin 6.5.   Clinical Impression  Pt presents with decreased overall strength, decreased mobility, and decreased balance.  Note that pt has been very reliant of wife for some time now and needs encouragement to be physically active.  Pt willing to make attempt to be active and is aware that wife is unable to assist as much as she was.   This PT feels he would be great candidate for CIR program to get him to mod I level for safe return home.  Discussed this with pt and wife and also discussed benefits of ST SNF placement.  Wife and pt to discuss but will continue to see acutely to address deficits.      Follow Up Recommendations CIR;Supervision/Assistance - 24 hour    Equipment Recommendations  Rolling walker with 5" wheels    Recommendations for Other Services Rehab consult;OT consult     Precautions / Restrictions Precautions Precautions: Fall Precaution Comments: wound vac R groin Restrictions Weight Bearing Restrictions: No      Mobility  Bed Mobility Overal bed mobility: Modified Independent             General bed mobility comments: with HOB flat and without rails to better simulate home environment  Transfers Overall transfer level: Needs assistance Equipment used: Rolling walker (2 wheeled) Transfers: Sit to/from Stand Sit to Stand: Min assist         General transfer comment: Min A for facilitation of foward weight shift with cues for hand placement on bed instead of pulling up on RW.   Ambulation/Gait Ambulation/Gait assistance: Min assist;Mod assist (single instance of LOB  requiring mod A ) Ambulation Distance (Feet): 20 Feet (x 4 reps (to wall across from room twice with standing rest break)) Assistive device: Rolling walker (2 wheeled) Gait Pattern/deviations: Decreased stride length;Trunk flexed;Narrow base of support;Shuffle Gait velocity: decreased   General Gait Details: Pt requires mod verbal cues for posture throughout gait along with safe when negotiating with RW, esp through tight spaces.  Single instance of LOB posteriorly when backing up to recliner, requiring mod A to correct.   Stairs            Wheelchair Mobility    Modified Rankin (Stroke Patients Only)       Balance Overall balance assessment: Needs assistance Sitting-balance support: Feet supported;No upper extremity supported Sitting balance-Leahy Scale: Good     Standing balance support: Bilateral upper extremity supported;During functional activity Standing balance-Leahy Scale: Poor Standing balance comment: Pt requires min to mod A along with use of RW to maintain balance during functional mobility.                              Pertinent Vitals/Pain Pain Assessment: No/denies pain    Home Living Family/patient expects to be discharged to:: Private residence Living Arrangements: Spouse/significant other Available Help at Discharge: Family;Available 24 hours/day Type of Home: House Home Access: Stairs to enter Entrance Stairs-Rails: None Entrance Stairs-Number of Steps: 2 Home Layout: One level Home Equipment: Cane - single point;Shower seat Additional Comments: has a stool at his sink, did not ambulate much in the  community     Prior Function Level of Independence: Independent with assistive device(s)         Comments: wife states she helps him some with bathing and dressing.  Pts wife states that pt did very well with HHPT back in November, however had difficulty maintaining active lifestyle and has become more reliant on her.  She now has some  health issues of her own and is unable to assist as she did before.      Hand Dominance        Extremity/Trunk Assessment               Lower Extremity Assessment: Generalized weakness      Cervical / Trunk Assessment: Kyphotic;Other exceptions  Communication   Communication: No difficulties  Cognition Arousal/Alertness: Awake/alert Behavior During Therapy: WFL for tasks assessed/performed Overall Cognitive Status: Within Functional Limits for tasks assessed                      General Comments      Exercises        Assessment/Plan    PT Assessment Patient needs continued PT services  PT Diagnosis Difficulty walking;Generalized weakness   PT Problem List Decreased strength;Decreased activity tolerance;Decreased balance;Decreased mobility;Decreased safety awareness;Cardiopulmonary status limiting activity  PT Treatment Interventions DME instruction;Gait training;Stair training;Functional mobility training;Therapeutic activities;Therapeutic exercise;Balance training;Patient/family education   PT Goals (Current goals can be found in the Care Plan section) Acute Rehab PT Goals Patient Stated Goal: to return home PT Goal Formulation: With patient/family Time For Goal Achievement: 12/15/14 Potential to Achieve Goals: Good    Frequency Min 4X/week   Barriers to discharge Decreased caregiver support      Co-evaluation               End of Session Equipment Utilized During Treatment: Gait belt Activity Tolerance: Patient limited by fatigue Patient left: in chair;with call bell/phone within reach;with family/visitor present Nurse Communication: Mobility status         Time: 1425-1500 PT Time Calculation (min) (ACUTE ONLY): 35 min   Charges:   PT Evaluation $Initial PT Evaluation Tier I: 1 Procedure PT Treatments $Gait Training: 8-22 mins $Self Care/Home Management: 8-22   PT G Codes:        Denice Bors 12/01/2014, 3:08  PM

## 2014-12-01 NOTE — Progress Notes (Addendum)
  [facility] Progress Note  Subjective  - Having numbness of right foot. Not wanting surgery.     Objective Filed Vitals:   12/01/14 0624  BP: 113/50  Pulse: 87  Temp: 98.4 F (36.9 C)  Resp: 18    Intake/Output Summary (Last 24 hours) at 12/01/14 1055 Last data filed at 12/01/14 0813  Gross per 24 hour  Intake    480 ml  Output   2500 ml  Net  -2020 ml    Right wound VAC seal intact. Right foot without visible ischemic changes.   Assessment/Planning: 69 y.o. male s/p R groin hematoma evacuation, VAC placement, s/p R thigh AVG, R leg runoff  Patient continues to elect observation  versus bypass surgery vs ligation of right thigh AVG Has been restarted on coumadin with heparin bridge Will arrange f/u with Dr. Bridgett Larsson in 2 weeks.  Dispo: home when INR therapeutic.   Alvia Grove 12/01/2014 10:55 AM --  Laboratory CBC    Component Value Date/Time   WBC 3.9* 12/01/2014 0340   WBC 4.5 02/13/2012 1314   HGB 7.9* 12/01/2014 0340   HGB 11.6* 02/13/2012 1314   HCT 25.1* 12/01/2014 0340   HCT 36.1* 02/13/2012 1314   PLT 54* 12/01/2014 0340   PLT 76* 02/13/2012 1314    BMET    Component Value Date/Time   NA 135 11/30/2014 1000   K 3.9 11/30/2014 1000   CL 102 11/30/2014 1000   CO2 24 11/30/2014 1000   GLUCOSE 118* 11/30/2014 1000   BUN 33* 11/30/2014 1000   CREATININE 8.18* 11/30/2014 1000   CALCIUM 9.1 11/30/2014 1000   GFRNONAA 6* 11/30/2014 1000   GFRAA 7* 11/30/2014 1000    COAG Lab Results  Component Value Date   INR 1.32 12/01/2014   INR 1.21 11/29/2014   INR 1.31 11/28/2014   No results found for: PTT  Antibiotics Anti-infectives    Start     Dose/Rate Route Frequency Ordered Stop   11/30/14 2200  vancomycin (VANCOCIN) IVPB 750 mg/150 ml premix  Status:  Discontinued     750 mg 150 mL/hr over 60 Minutes Intravenous Every M-W-F (Hemodialysis) 11/28/14 0735 11/28/14 0927   11/28/14 0745  vancomycin (VANCOCIN) IVPB 750  mg/150 ml premix  Status:  Discontinued     750 mg 150 mL/hr over 60 Minutes Intravenous  Once 11/28/14 0735 11/28/14 0927   11/21/14 0921  vancomycin (VANCOCIN) 1 GM/200ML IVPB    Comments:  Elige Ko   : cabinet override      11/21/14 0921 11/21/14 1025   11/18/14 1200  vancomycin (VANCOCIN) IVPB 750 mg/150 ml premix  Status:  Discontinued     750 mg 150 mL/hr over 60 Minutes Intravenous Every M-W-F (Hemodialysis) 11/17/14 0051 11/28/14 0735   11/17/14 0100  vancomycin (VANCOCIN) 1,500 mg in sodium chloride 0.9 % 500 mL IVPB     1,500 mg 250 mL/hr over 120 Minutes Intravenous  Once 11/17/14 0051 11/17/14 0407       Virgina Jock, PA-C [facility] Office: [phone] Pager: [phone] 12/01/2014 10:55 AM     Addendum  I have independently interviewed and examined the patient, and I agree with the physician assistant's findings.    Adele Barthel, MD [facility] [place] Office: [phone] Pager: [phone]  12/02/2014, 8:30 AM

## 2014-12-01 NOTE — Progress Notes (Signed)
[facility] Rounding Note Subjective:     Dr. Bridgett Larsson has discussed plan with pt For now - observation/expectant followup Transitioning back on to coumadin  Objective Filed Vitals:   11/30/14 1415 11/30/14 2203 11/30/14 2253 12/01/14 0624  BP: 163/64 109/42 113/46 113/50  Pulse:  95 89 87  Temp:  98.6 F (37 C) 98.2 F (36.8 C) 98.4 F (36.9 C)  TempSrc:   Oral   Resp:  18 17 18   Height:      Weight:    69.2 kg (152 lb 8.9 oz)  SpO2:  100% 96% 100%   Physical Exam General: alert and oriented, no acute distress.  Heart: Regular S1S2 No S3 Lungs: Clear, unlabored  Abdomen: soft, nontender +BS Extremities: no LE edema. Bilat arm chronic edema  Dialysis Access: L IJ. R thigh AVG with wound vac  Labs: Basic Metabolic Panel:  Recent Labs Lab 11/27/14 1820 11/29/14 0730 11/30/14 1000  NA 132* 136 135  K 4.5 4.0 3.9  CL 98* 100* 102  CO2 24 26 24   GLUCOSE 114* 96 118*  BUN 32* 25* 33*  CREATININE 7.78* 6.38* 8.18*  CALCIUM 9.0 9.5 9.1  PHOS 3.7 3.7 4.0     Recent Labs Lab 11/27/14 1820 11/29/14 0730 11/30/14 1000  ALBUMIN 2.5* 2.6* 2.4*   CBC:  Recent Labs Lab 11/28/14 0313 11/29/14 0730 11/30/14 0355 11/30/14 1000 12/01/14 0340  WBC 3.5* 4.7 3.9* 4.0 3.9*  HGB 8.3* 8.5* 8.1* 7.9* 7.9*  HCT 26.2* 27.1* 26.0* 25.2* 25.1*  MCV 93.6 93.8 93.2 93.0 93.3  PLT 67* 72* 59* 64* 54*    Blood Culture    Component Value Date/Time   SDES WOUND GROIN RIGHT 11/21/2014 1007   SDES WOUND GROIN RIGHT 11/21/2014 1007   SPECREQUEST RIGHT GROIN HEMATOMA PT ON VANCOMYCIN 11/21/2014 1007   SPECREQUEST RIGHT GROIN HEMATOMA PT ON VANCOMYCIN 11/21/2014 1007   CULT  11/21/2014 1007    NO ANAEROBES ISOLATED Performed at [facility]  11/21/2014 1007    NO GROWTH 3 DAYS Performed at [facility] 11/26/2014 FINAL 11/21/2014 1007   REPTSTATUS 11/24/2014 FINAL 11/21/2014 1007      Recent Labs Lab 11/30/14 0008  11/30/14 0621 11/30/14 1608 11/30/14 2250 12/01/14 0621  GLUCAP 130* 112* 103* 106* 94    Medications: . sodium chloride 10 mL/hr (11/26/14 0006)  . heparin 1,150 Units/hr (12/01/14 0644)   . sodium chloride   Intravenous Once  . atorvastatin  40 mg Oral Daily  . azaTHIOprine  75 mg Oral q morning - 10a  . calcium acetate  667 mg Oral BID WC  . carvedilol  6.25 mg Oral QHS  . colesevelam  3,750 mg Oral Daily  . darbepoetin (ARANESP) injection - DIALYSIS  200 mcg Intravenous Q Wed-HD  . docusate sodium  100 mg Oral Daily  . doxercalciferol  4 mcg Intravenous Q M,W,F-HD  . famotidine  10 mg Oral QHS  . feeding supplement (PRO-STAT SUGAR FREE 64)  30 mL Oral BID  . feeding supplement (RESOURCE BREEZE)  1 Container Oral BID BM  . insulin aspart  0-5 Units Subcutaneous QHS  . insulin aspart  0-9 Units Subcutaneous TID WC  . isosorbide dinitrate  40 mg Oral 3 times per day  . multivitamin  1 tablet Oral QHS  . pantoprazole  40 mg Oral Daily  . predniSONE  5 mg Oral Q breakfast  . Sirolimus  1.5  mg Oral Daily  . Warfarin - Pharmacist Dosing Inpatient   Does not apply q1800    Dialysis Orders:  [facility]  MWF 4 hours EDW 72 - needs lower EDW at d/c - estimate 70 kg based on pre/post wts 2K 2 Ca  4 hr  3000 heparin no heparin at discharge left IJ and right fem AVGG inserted 5/26  Hectorol 4  Aranesp 200 q Wed no Fe  Assessment/Plan: 1. 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 Wound culture of groin no growth. VVS to followup. 2. RLE arterial insufficiency - s/p angio / per Dr.Chen- not likely to be responsive to endovascular intervention- Plan for now is observation. Pt to transition back to warfarin.  3. Anemia - multiple etiologies- due to AVG bleed / CKD Anemia/ and immunosuppression meds- s/p transfusions on 6/22 (2U) and 7/3 (1U). On max Aranesp/getting Fe load. Hb drifting. I will order a  unit of blood for him with HD tomorrow. With a heart transplant and CAD does not tolerate Hb in the 7's well. 4. ESRD - MWF HD - no heparin with HD since on anticoagulation. New EDW of 70 at discharge 5. Metabolic bone disease - Hectorol 4. Phos 4. Reduced ca acetate BID with meals this admission. 6. Volume/ HTN: BP stable with HD and low dose coreg 7. 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 Transitioning back to coumadin since no invasive procedure planned  8. 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 Dm type 2- per primary service 10. Malnutrition - alb mid 2's - renal diet/vits/supplements 11. Thrombocytopenia - increases bleeding risk 12. Disposition - anticipate home when coumadin therapeutic?  HD tomorrow.  [name], MD [facility] [phone] Pager 12/01/2014, 9:54 AM

## 2014-12-01 NOTE — Progress Notes (Signed)
Rehab Admissions Coordinator Note:  Patient was screened by Yunuen Mordan L for appropriateness for an Inpatient Acute Rehab Consult.  At this time, we are recommending Inpatient Rehab consult. I will reach out to Dr. Waldron Labs for a rehab order.  Coreen Shippee L 12/01/2014, 3:44 PM  I can be reached at [phone].

## 2014-12-01 NOTE — Consult Note (Signed)
Physical Medicine and Rehabilitation Consult  Reason for Consult: Deconditioned due to anemia from thigh hematoma and PAD Referring Physician: Dr. Waldron Labs.    HPI: Brandon Sharp is a 69 y.o. male with history of DM type 2, CAD, CHF s/p Cardiac transplant, ESRD who was admitted on 11/16/14 from HD center due to profound weakness. He was found to be anemic due to right thigh hematoma s/p recent AVG placement. He was transfused with 2 units PRBC and was taken to OR on 11/21/14 for evacuation of  Thigh hematoma with VAC placement. He developed right foot ischemia and pain therefore arteriogram recommended due to concerns of steal syndrome. He underwent aortogram with attempts at recanalization of R-SFA stent and evidence of medium length occlusion. Patient has elected on observation for now v/s R-FPBG or ligation of  RLE AVG. Therapy ongoing and CIR recommended due to deconditioned state.    Review of Systems  Constitutional: Negative for fever.  Eyes: Negative for blurred vision.  Respiratory: Negative for cough.   Cardiovascular: Negative for chest pain.  Gastrointestinal: Negative for heartburn.  Genitourinary: Negative for dysuria.  Musculoskeletal: Positive for myalgias and joint pain.  Skin: Negative for rash.  Neurological: Positive for focal weakness. Negative for dizziness and headaches.  Endo/Heme/Allergies: Bruises/bleeds easily.  Psychiatric/Behavioral: Negative for depression.   Past Medical History  Diagnosis Date  . Diabetes mellitus   . Hypertension   . Myocardial infarction 1985; 1990  . Angina   . Dysrhythmia   . DVT (deep venous thrombosis) ~ 12/2010    LLE  . Anemia   . Blood transfusion 06/1999    post heart transplant  . Peripheral vascular disease   . Renal failure     Hemodialysis MWF last 2 years, sse dr [name] nephrology, goes to [facility]  . DVT (deep venous thrombosis) 08/25/2014    Nearly occluded R IJ, and L subclavian  DVT  . CHF (congestive heart failure) 2001    beffore transplant; had AICD that was explanted 07/19/99 following successful heart transplant  . Coronary artery disease     transplant cardiologist Dr. Corky Mull [facility])  . Pneumonia    Past Surgical History  Procedure Laterality Date  . Nephrectomy transplanted organ  2008  . Av fistula repair      rt. a=fore arm fistula  . Av fistula placement  ~ 01/2010    "this was my 2nd fistula placement"  . Heart transplant  06/1999  . Colonoscopy  03/17/2012    Procedure: COLONOSCOPY;  Surgeon: Lear Ng, MD;  Location: [facility];  Service: Endoscopy;  Laterality: N/A;  . Coronary artery bypass graft  1990    CABG X3  . Colonoscopy with propofol N/A 12/14/2013    Procedure: COLONOSCOPY WITH PROPOFOL;  Surgeon: Lear Ng, MD;  Location: [facility];  Service: Endoscopy;  Laterality: N/A;  . Video bronchoscopy Bilateral 04/14/2014    Procedure: VIDEO BRONCHOSCOPY WITH FLUORO;  Surgeon: Collene Gobble, MD;  Location: [place];  Service: Cardiopulmonary;  Laterality: Bilateral;  . Shuntogram Right 05/09/2014    Procedure: FISTULOGRAM;  Surgeon: Conrad Clio, MD;  [facility]: [facility] C[facility];  Service: Cardiovascular;  Laterality: Right;  . Coronary angioplasty with stent placement  1985; 1990    prior stents in the 80's and 90's then CABG; s/p heart transplant 2001 s/p BMS mid LAD and proximal RCA 04/21/13 [facility])  . Av fistula placement Right 10/20/2014    Procedure:  INSERTION OF ARTERIOVENOUS (AV) GORE-TEX GRAFT THIGH;  Surgeon: Conrad Vineyard, MD;  Location: [place];  Service: Vascular;  Laterality: Right;  . Hematoma evacuation Right 11/21/2014    Procedure: EVACUATION HEMATOMA Right Groin;  Surgeon: Conrad Circle, MD;  Location: [place];  Service: Vascular;  Laterality: Right;  . Application of wound vac Right 11/21/2014    Procedure: APPLICATION OF WOUND VAC Right Groin;  Surgeon: Conrad Jennings, MD;  Location: [place];  Service:  Vascular;  Laterality: Right;  . Peripheral vascular catheterization N/A 11/25/2014    Procedure: Abdominal Aortogram;  Surgeon: Elam Dutch, MD;  Location: [place] CV LAB;  Service: Cardiovascular;  Laterality: N/A;  . Lower extremity angiogram Right 11/25/2014    Procedure: Lower Extremity Angiogram;  Surgeon: Elam Dutch, MD;  Location: [place] CV LAB;  Service: Cardiovascular;  Laterality: Right;   Family History  Problem Relation Age of Onset  . Heart disease Mother   . Hyperlipidemia Mother   . Hypertension Mother   . Diabetes Mother   . Hyperlipidemia Father   . Hypertension Father   . Heart disease Father   . Diabetes Brother   . Heart disease Brother     Heart Disease before age 81  . Hyperlipidemia Brother   . Hypertension Brother   . Heart attack Brother    Social History:  reports that he quit smoking about 26 years ago. His smoking use included Cigarettes. He has a 20 pack-year smoking history. He has never used smokeless tobacco. He reports that he drinks alcohol. He reports that he does not use illicit drugs. Allergies:  Allergies  Allergen Reactions  . Lisinopril Swelling    Lips and tongue swell  . Niacin And Related Other (See Comments)    unknown  . Norvasc [Amlodipine Besylate] Rash    Flushing  . Penicillins Rash   Medications Prior to Admission  Medication Sig Dispense Refill  . atorvastatin (LIPITOR) 40 MG tablet Take 40 mg by mouth daily.      Marland Kitchen azaTHIOprine (IMURAN) 50 MG tablet Take 75 mg by mouth every morning.     Marland Kitchen b complex-vitamin c-folic acid (NEPHRO-VITE) 0.8 MG TABS tablet Take 1 tablet by mouth at bedtime.    . calcium acetate (PHOSLO) 667 MG capsule Take 667 mg by mouth 3 (three) times daily with meals.     . carvedilol (COREG) 6.25 MG tablet Take 1 tablet (6.25 mg total) by mouth at bedtime. 30 tablet 0  . colesevelam (WELCHOL) 625 MG tablet Take 3,750 mg by mouth daily. Takes 6 tabs daily    . famotidine (PEPCID AC) 10 MG  chewable tablet Chew 10 mg by mouth at bedtime.     Marland Kitchen HUMALOG KWIKPEN 100 UNIT/ML SOPN Inject 4-10 Units into the skin 2 (two) times daily. Per sliding scale    . insulin glargine (LANTUS) 100 UNIT/ML injection Inject 20 Units into the skin every morning.    . isosorbide dinitrate (ISOCHRON) 40 MG CR tablet Take 40 mg by mouth every 8 (eight) hours.  11  . predniSONE (DELTASONE) 5 MG tablet Take 1 tablet (5 mg total) by mouth daily with breakfast. 30 tablet 0  . Sirolimus 0.5 MG TABS Take 3 tablets by mouth daily.  11  . warfarin (COUMADIN) 5 MG tablet Take 1 tablet (5 mg total) by mouth daily. 30 tablet 0    Home: Home Living Family/patient expects to be discharged to:: Private residence Living Arrangements: Spouse/significant  other Available Help at Discharge: Family, Available 24 hours/day Type of Home: House Home Access: Stairs to enter [employer] of Steps: 2 Entrance Stairs-Rails: None Home Layout: One level Home Equipment: Cane - single point, Shower seat Additional Comments: has a stool at his sink, did not ambulate much in the community   Functional History: Prior Function Level of Independence: Independent with assistive device(s) Comments: wife states she helps him some with bathing and dressing.  Pts wife states that pt did very well with HHPT back in November, however had difficulty maintaining active lifestyle and has become more reliant on her.  She now has some health issues of her own and is unable to assist as she did before.  Functional Status:  Mobility: Bed Mobility Overal bed mobility: Modified Independent General bed mobility comments: with HOB flat and without rails to better simulate home environment Transfers Overall transfer level: Needs assistance Equipment used: Rolling walker (2 wheeled) Transfers: Sit to/from Stand Sit to Stand: Min assist General transfer comment: Min A for facilitation of foward weight shift with cues for hand placement  on bed instead of pulling up on RW.  Ambulation/Gait Ambulation/Gait assistance: Min assist, Mod assist (single instance of LOB requiring mod A ) Ambulation Distance (Feet): 20 Feet (x 4 reps (to wall across from room twice with standing rest break)) Assistive device: Rolling walker (2 wheeled) General Gait Details: Pt requires mod verbal cues for posture throughout gait along with safe when negotiating with RW, esp through tight spaces.  Single instance of LOB posteriorly when backing up to recliner, requiring mod A to correct.  Gait Pattern/deviations: Decreased stride length, Trunk flexed, Narrow base of support, Shuffle Gait velocity: decreased    ADL:    Cognition: Cognition Overall Cognitive Status: Within Functional Limits for tasks assessed Orientation Level: Oriented X4 Cognition Arousal/Alertness: Awake/alert Behavior During Therapy: WFL for tasks assessed/performed Overall Cognitive Status: Within Functional Limits for tasks assessed  Blood pressure 113/50, pulse 87, temperature 98.4 F (36.9 C), temperature source Oral, resp. rate 18, height 6' (1.829 m), weight 69.2 kg (152 lb 8.9 oz), SpO2 100 %. Physical Exam  Constitutional: He is oriented to person, place, and time. He appears well-developed and well-nourished.  HENT:  Head: Normocephalic and atraumatic.  Right Ear: External ear normal.  Left Ear: External ear normal.  Eyes: Conjunctivae and EOM are normal. Pupils are equal, round, and reactive to light.  Neck: Normal range of motion.  Cardiovascular: Normal rate and normal heart sounds.   Respiratory: No respiratory distress.  GI: He exhibits no distension.  Musculoskeletal: He exhibits edema.  Right thigh vac in place. Leg with swelling. Thigh tender to ROM/palpation.  Neurological: He is alert and oriented to person, place, and time. He has normal reflexes. No cranial nerve deficit.  UE: 5/5 prox to distal. RLE: 2+hf, 3 ke, 4 adf/apf, LLE: 3 hf, 3+ke, 4  adf/apf.  No sensory deficits.   Skin: Skin is dry.  Psychiatric: He has a normal mood and affect. His behavior is normal.    Results for orders placed or performed during the hospital encounter of 11/16/14 (from the past 24 hour(s))  Glucose, capillary     Status: Abnormal   Collection Time: 11/30/14 10:50 PM  Result Value Ref Range   Glucose-Capillary 106 (H) 65 - 99 mg/dL   Comment 1 Notify RN    Comment 2 Document in Chart   Heparin level (unfractionated)     Status: None   Collection Time: 12/01/14  3:40 AM  Result Value Ref Range   Heparin Unfractionated 0.41 0.30 - 0.70 IU/mL  CBC     Status: Abnormal   Collection Time: 12/01/14  3:40 AM  Result Value Ref Range   WBC 3.9 (L) 4.0 - 10.5 K/uL   RBC 2.69 (L) 4.22 - 5.81 MIL/uL   Hemoglobin 7.9 (L) 13.0 - 17.0 g/dL   HCT 25.1 (L) 39.0 - 52.0 %   MCV 93.3 78.0 - 100.0 fL   MCH 29.4 26.0 - 34.0 pg   MCHC 31.5 30.0 - 36.0 g/dL   RDW 19.0 (H) 11.5 - 15.5 %   Platelets 54 (L) 150 - 400 K/uL  Protime-INR     Status: Abnormal   Collection Time: 12/01/14  3:40 AM  Result Value Ref Range   Prothrombin Time 16.5 (H) 11.6 - 15.2 seconds   INR 1.32 0.00 - 1.49  Glucose, capillary     Status: None   Collection Time: 12/01/14  6:21 AM  Result Value Ref Range   Glucose-Capillary 94 65 - 99 mg/dL  Glucose, capillary     Status: Abnormal   Collection Time: 12/01/14 11:15 AM  Result Value Ref Range   Glucose-Capillary 104 (H) 65 - 99 mg/dL   Comment 1 Notify RN    No results found.  Assessment/Plan: Diagnosis: R thigh hematoma, PAD with subsequent weakness/ gait instability 1. Does the need for close, 24 hr/day medical supervision in concert with the patient's rehab needs make it unreasonable for this patient to be served in a less intensive setting? Yes 2. Co-Morbidities requiring supervision/potential complications: ESRD, htn, dm 3. Due to bowel management, safety, skin/wound care, disease management, medication administration,  pain management and patient education, does the patient require 24 hr/day rehab nursing? Yes 4. Does the patient require coordinated care of a physician, rehab nurse, PT (1-2 hrs/day, 5 days/week) and OT (1-2 hrs/day, 5 days/week) to address physical and functional deficits in the context of the above medical diagnosis(es)? Yes Addressing deficits in the following areas: balance, endurance, locomotion, strength, transferring, bowel/bladder control, bathing, dressing, feeding, grooming, toileting and psychosocial support 5. Can the patient actively participate in an intensive therapy program of at least 3 hrs of therapy per day at least 5 days per week? Yes 6. The potential for patient to make measurable gains while on inpatient rehab is excellent 7. Anticipated functional outcomes upon discharge from inpatient rehab are modified independent  with PT, modified independent with OT, n/a with SLP. 8. Estimated rehab length of stay to reach the above functional goals is: 7-9 days 9. Does the patient have adequate social supports and living environment to accommodate these discharge functional goals? Yes 10. Anticipated D/C setting: Home 11. Anticipated post D/C treatments: HH therapy and Outpatient therapy 12. Overall Rehab/Functional Prognosis: excellent  RECOMMENDATIONS: This patient's condition is appropriate for continued rehabilitative care in the following setting: CIR Patient has agreed to participate in recommended program. Yes and Potentially Note that insurance prior authorization may be required for reimbursement for recommended care.  Comment: Rehab Admissions Coordinator to follow up.  Thanks,  Meredith Staggers, MD, Mellody Drown     12/01/2014

## 2014-12-01 NOTE — Progress Notes (Signed)
ANTICOAGULATION CONSULT NOTE - Follow Up Consult  Pharmacy Consult for Heparin / Coumadin Indication: Hx of DVT  Allergies  Allergen Reactions  . Lisinopril Swelling    Lips and tongue swell  . Niacin And Related Other (See Comments)    unknown  . Norvasc [Amlodipine Besylate] Rash    Flushing  . Penicillins Rash    Patient Measurements: Height: 6' (182.9 cm) Weight: 152 lb 8.9 oz (69.2 kg) IBW/kg (Calculated) : 77.6  Vital Signs: Temp: 98.4 F (36.9 C) (07/07 0624) Temp Source: Oral (07/06 2253) BP: 113/50 mmHg (07/07 0624) Pulse Rate: 87 (07/07 0624)  Labs:  Recent Labs  11/29/14 0730 11/30/14 0355 11/30/14 1000 12/01/14 0340  HGB 8.5* 8.1* 7.9* 7.9*  HCT 27.1* 26.0* 25.2* 25.1*  PLT 72* 59* 64* 54*  LABPROT 15.5*  --   --  16.5*  INR 1.21  --   --  1.32  HEPARINUNFRC 0.67 0.46  --  0.41  CREATININE 6.38*  --  8.18*  --     Estimated Creatinine Clearance: 8.3 mL/min (by C-G formula based on Cr of 8.18).   Assessment: 69 y.o. on Coumadin PTA for hx of DVT(March 2016), s/p hematoma evacuation 6/27. 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 Coumadin currently on hold awaiting vascular intervention and then could be restarted post surgery. HL is currently therapeutic at 0.41. INR subtherapeutic but trending up to 1.32. Hgb low but stable. Thrombocytopenia stable. No bleeding noted.  Goal of Therapy:  INR 2-3 Heparin level 0.3-0.7 units/ml Monitor platelets by anticoagulation protocol: Yes   Plan:  Continue heparin gtt at 1,150 units/hr Give coumadin 5mg  PO x 1  Monitor daily HL, INR, CBC, s/s of bleed  Brandon Sharp J 12/01/2014,10:27 AM

## 2014-12-01 NOTE — Care Management (Signed)
Important Message  Patient Details  Name: Brandon Sharp MRN: [id number] Date of Birth: 02-Jun-1945   Medicare Important Message Given:  Yes-second notification given    [name], RN 12/01/2014, 12:13 PM

## 2014-12-01 NOTE — Care Management Note (Addendum)
Case Management Note  Patient Details  Name: Brandon Sharp MRN: [id number] Date of Birth: 07/11/1945  Subjective/Objective: Pt admitted with symptomatic anemia               Action/Plan:  Pt is from home.  Pt had wound vac placed during this admit. Discharge home plan includes HHRN and wound vac with [employer].  CM will assist and will continue to monitor for disposition   Expected Discharge Date:                  Expected Discharge Plan:  Home/Self Care  In-House Referral:  Clinical Social Work  Discharge planning Services  CM Consult  Post Acute Care Choice:    Choice offered to:  Patient  DME Arranged:    DME Agency:     HH Arranged:  HHRN (wound vac) HH Agency:  [facility]  Status of Service:  In process, will continue to follow  Medicare Important Message Given:  Yes-second notification given Date Medicare IM Given:    Medicare IM give by:    Date Additional Medicare IM Given:    Additional Medicare Important Message give by:     If discussed at [place] of Stay Meetings, dates discussed:  12/01/14  Additional Comments: 12/01/14 [name], RN, BSN [phone] Home wound vac delivered to pts room during the evening of 10/31/14.    11/30/14  CM contacted [employer] resource as follow up to referral, informed of pending discharge.  CM was informed by [facility] resource that due to this pt's zipcode; this referral has been outsourced to [name] [phone].  [employer] requested CM to contact company and verify referral was accepted, CM was told yesterday by resource that referral was already accepted, without questions or concerns).  CM contacted [employer] and was informed that referral information had not been received from [facility], company requested CM fax referral documents.  CM contacted [employer], was assured fax was sent at approximately 3pm 11/29/14, resource stated that she would ensure fax would be resent and she would follow back up with confirmation on fax being sent with CM.   CM faxed [employer] directly with requested documents.  CM followed up with [name] at [facility] scientist) and verified fax was sent and referral was accepted, company informed of possible discharge tomorrow, will continue to follow and inform [employer] of discharge when known.  11/29/14 CM faxed information to [facility], referral was accepted.  CM offered pt choice for [facility], pt chose [facility], referral accepted.  CM will continue to monitor for disposition needs [name], RN 12/01/2014, 2:14 PM

## 2014-12-01 NOTE — Progress Notes (Signed)
[facility]                                                                              Patient Demographics  Brandon Sharp, is a 69 y.o. male, DOB - 02-24-1946, UJW:[id number]  Admit date - 11/16/2014   Admitting Physician Erline Hau, MD  Outpatient Primary MD for the patient is [name], MD  LOS - 69   Chief Complaint  Patient presents with  . Abnormal Lab       Brief HPI   69 year old man with significant past medical history for end-stage renal disease on hemodialysis Monday Wednesday Friday, status post DVT on Coumadin, hypertension, diabetes who presented with severe weakness and found to have a hemoglobin 6.5.  On 6/8 he was found to have a hemoglobin of 7.4 and was given 1 unit of PRBCs. His recent history is significant for a right thigh dialysis graft done in March of this year by Dr. Bridgett Larsson. Right groin ultrasound is done in the ED and shows a large hypoechoic structure consistent with a hematoma. S/p vasc surgery R groin hematoma evacuation, VAC placement, s/p R thigh AVG, R leg runoff 6/27, patient was resumed on warfarin 7/6 as no further plan for surgical intervention by vascular surgery.  Assessment & Plan   Symptomatic anemia due to Dehisced R thigh AVG wound, right groin hematoma - due to right groin hematoma due to dehiscence of his AVG status post evacuation and wound VAC, R thigh AVG, R leg runoff 6/27  by Dr. Bridgett Larsson - IV iron and Epogen therapy as per renal service discretion, will check hemoglobin in a.m. and may transfuse during dialysis tomorrow. - Wound cultures has been negative, off vancomycin.   Peripheral arterial disease, right lower extremity arterial insufficiency  Vascular surgery following, patient underwent right leg angiogram Angiogram showed: chronic right superficial femoral artery occlusion with reconstitution of the below-knee popliteal artery. - Vein mapping done - Vascular surgery discussed option  with the patient, which includes observation versus R fem-pop bypass with PTFE vs ligation of right thigh AVG, patient elected observation and this point. - Giving no surgical intervention anticipated, will resume back on warfarin, will be bridged with heparin gtt. - wound VAC prescription has been signed and home health can arrange wound VAC at home  hypoglycemia with type 2 diabetes Blood sugars currently controlled, continue sliding scale insulin Hemoglobin A1c 5.6  Thrombocytopenia - Chronic, been in the mid 50s in the past, patient is on sirolimus and azathioprine, which is contributing to it. Continue to monitor closely, no indication for transfusion.  DVT, bilateral upper extremity  -Has been on Coumadin since March 2016. -Continue heparin drip, coumadin has been resumed  Heart transplant -Continue immunosuppressive medications.  End-stage renal disease on hemodialysis -Appreciate nephrology following. Hemodialysis MWF.  Hypertension -Stable and well control will continue home antihypertensive regimen -Patient on HD M-W-F which is also helping volume control  Mild-to-moderate malnutrition Continue nutritional supplements  Code Status: Full code  Family Communication: Discussed with wife at bedside   Disposition Plan: Once INR is therapeutic, and patient is off heparin drip  Time Spent in minutes  30 minutes  Procedures  Right groin hematoma evacuation Angiogram Hemodialysis  Consults   Nephrology Vascular surgery  DVT Prophylaxis  heparin drip  Medications  Scheduled Meds: . sodium chloride   Intravenous Once  . atorvastatin  40 mg Oral Daily  . azaTHIOprine  75 mg Oral q morning - 10a  . calcium acetate  667 mg Oral BID WC  . carvedilol  6.25 mg Oral QHS  . colesevelam  3,750 mg Oral Daily  . darbepoetin (ARANESP) injection - DIALYSIS  200 mcg Intravenous Q Wed-HD  . docusate sodium  100 mg Oral Daily  . doxercalciferol  4 mcg Intravenous Q M,W,F-HD    . famotidine  10 mg Oral QHS  . feeding supplement (PRO-STAT SUGAR FREE 64)  30 mL Oral BID  . feeding supplement (RESOURCE BREEZE)  1 Container Oral BID BM  . insulin aspart  0-5 Units Subcutaneous QHS  . insulin aspart  0-9 Units Subcutaneous TID WC  . isosorbide dinitrate  40 mg Oral 3 times per day  . multivitamin  1 tablet Oral QHS  . pantoprazole  40 mg Oral Daily  . predniSONE  5 mg Oral Q breakfast  . Sirolimus  1.5 mg Oral Daily  . warfarin  5 mg Oral ONCE-1800  . Warfarin - Pharmacist Dosing Inpatient   Does not apply q1800   Continuous Infusions: . sodium chloride 10 mL/hr (11/26/14 0006)  . heparin 1,150 Units/hr (12/01/14 0644)   PRN Meds:.acetaminophen **OR** acetaminophen, hydrALAZINE, labetalol, metoprolol, ondansetron, ondansetron **OR** [DISCONTINUED] ondansetron (ZOFRAN) IV, oxyCODONE, senna-docusate, sodium chloride   Antibiotics   Anti-infectives    Start     Dose/Rate Route Frequency Ordered Stop   11/30/14 2200  vancomycin (VANCOCIN) IVPB 750 mg/150 ml premix  Status:  Discontinued     750 mg 150 mL/hr over 60 Minutes Intravenous Every M-W-F (Hemodialysis) 11/28/14 0735 11/28/14 0927   11/28/14 0745  vancomycin (VANCOCIN) IVPB 750 mg/150 ml premix  Status:  Discontinued     750 mg 150 mL/hr over 60 Minutes Intravenous  Once 11/28/14 0735 11/28/14 0927   11/21/14 0921  vancomycin (VANCOCIN) 1 GM/200ML IVPB    Comments:  Elige Ko   : cabinet override      11/21/14 0921 11/21/14 1025   11/18/14 1200  vancomycin (VANCOCIN) IVPB 750 mg/150 ml premix  Status:  Discontinued     750 mg 150 mL/hr over 60 Minutes Intravenous Every M-W-F (Hemodialysis) 11/17/14 0051 11/28/14 0735   11/17/14 0100  vancomycin (VANCOCIN) 1,500 mg in sodium chloride 0.9 % 500 mL IVPB     1,500 mg 250 mL/hr over 120 Minutes Intravenous  Once 11/17/14 0051 11/17/14 0407        Subjective:   Brandon Sharp was seen and examined today. . Patient denies dizziness, chest  pain, shortness of breath, abdominal pain, N/V/D/C, new weakness, numbess, tingling. No acute events overnight.  Wound VAC on+  Objective:   Blood pressure 113/50, pulse 87, temperature 98.4 F (36.9 C), temperature source Oral, resp. rate 18, height 6' (1.829 m), weight 69.2 kg (152 lb 8.9 oz), SpO2 100 %.  Wt Readings from Last 3 Encounters:  12/01/14 69.2 kg (152 lb 8.9 oz)  11/08/14 74.844 kg (165 lb)  10/20/14 72.122 kg (159 lb)     Intake/Output Summary (Last 24 hours) at 12/01/14 1326 Last data filed at 12/01/14 0813  Gross per 24 hour  Intake    480 ml  Output      0  ml  Net    480 ml    Exam  General: Alert and oriented x 3, NAD  HEENT:  PERRLA, EOMI,   Neck: Supple  CVS: S1 S2clear, no mrg,   Respiratory: CTAB  Abdomen: Soft, NT, ND, NBS  Ext: no cyanosis clubbing, 1+ edema, wound VAC to the right groin, bilateral upper extremity edema  Neuro: no new deficits  Skin: No rashes  Psych: Normal affect and demeanor, alert and oriented x3    Data Review   Micro Results No results found for this or any previous visit (from the past 240 hour(s)).  Radiology Reports Dg Chest 2 View  11/11/2014   CLINICAL DATA:  Short of breath, low hemoglobin blood transfusion  EXAM: CHEST  2 VIEW  COMPARISON:  08/25/2014  FINDINGS: Vascular stents noted within the subclavian veins. Large-bore left central venous line with tip in the distal SVC. Normal cardiac silhouette. No pulmonary edema. Mild left basilar atelectasis. No pneumothorax.  IMPRESSION: 1. Mild left basilar atelectasis. 2. No edema or infiltrate.   Electronically Signed   By: Suzy Bouchard M.D.   On: 11/11/2014 19:29    CBC  Recent Labs Lab 11/28/14 0313 11/29/14 0730 11/30/14 0355 11/30/14 1000 12/01/14 0340  WBC 3.5* 4.7 3.9* 4.0 3.9*  HGB 8.3* 8.5* 8.1* 7.9* 7.9*  HCT 26.2* 27.1* 26.0* 25.2* 25.1*  PLT 67* 72* 59* 64* 54*  MCV 93.6 93.8 93.2 93.0 93.3  MCH 29.6 29.4 29.0 29.2 29.4  MCHC 31.7  31.4 31.2 31.3 31.5  RDW 20.2* 19.7* 19.3* 19.3* 19.0*    Chemistries   Recent Labs Lab 11/25/14 1744 11/26/14 0753 11/27/14 1820 11/29/14 0730 11/30/14 1000  NA 132* 134* 132* 136 135  K 4.1 4.0 4.5 4.0 3.9  CL 96* 99* 98* 100* 102  CO2 27 27 24 26 24   GLUCOSE 115* 94 114* 96 118*  BUN 19 22* 32* 25* 33*  CREATININE 4.35* 5.35* 7.78* 6.38* 8.18*  CALCIUM 8.4* 8.8* 9.0 9.5 9.1   ------------------------------------------------------------------------------------------------------------------ estimated creatinine clearance is 8.3 mL/min (by C-G formula based on Cr of 8.18). ------------------------------------------------------------------------------------------------------------------ No results for input(s): HGBA1C in the last 72 hours. ------------------------------------------------------------------------------------------------------------------ No results for input(s): CHOL, HDL, LDLCALC, TRIG, CHOLHDL, LDLDIRECT in the last 72 hours. ------------------------------------------------------------------------------------------------------------------ No results for input(s): TSH, T4TOTAL, T3FREE, THYROIDAB in the last 72 hours.  Invalid input(s): FREET3 ------------------------------------------------------------------------------------------------------------------ No results for input(s): VITAMINB12, FOLATE, FERRITIN, TIBC, IRON, RETICCTPCT in the last 72 hours.  Coagulation profile  Recent Labs Lab 11/26/14 0753 11/27/14 0317 11/28/14 0313 11/29/14 0730 12/01/14 0340  INR 1.31 1.31 1.31 1.21 1.32    No results for input(s): DDIMER in the last 72 hours.  Cardiac Enzymes No results for input(s): CKMB, TROPONINI, MYOGLOBIN in the last 168 hours.  Invalid input(s): CK ------------------------------------------------------------------------------------------------------------------ Invalid input(s): POCBNP   Recent Labs  11/30/14 0008 11/30/14 0621  11/30/14 1608 11/30/14 2250 12/01/14 0621 12/01/14 1115  GLUCAP 130* 112* 103* 106* 94 104*     Demi Trieu M.D. [facility] 12/01/2014, 1:26 PM  Pager: [phone]  Between 7am to 7pm - call Pager - [phone]  After 7pm go to [email or web address] - password [email or web address]  Call night coverage person covering after 7pm

## 2014-12-02 ENCOUNTER — Telehealth: Payer: Self-pay | Admitting: Vascular Surgery

## 2014-12-02 ENCOUNTER — Inpatient Hospital Stay (HOSPITAL_COMMUNITY)
Admission: AD | Admit: 2014-12-02 | Discharge: 2014-12-14 | DRG: 947 | Disposition: A | Discharge status: Home Health | Payer: Medicare Other | Source: Intra-hospital | Attending: Physical Medicine & Rehabilitation | Admitting: Physical Medicine & Rehabilitation

## 2014-12-02 ENCOUNTER — Encounter (HOSPITAL_COMMUNITY): Payer: Self-pay | Admitting: *Deleted

## 2014-12-02 DIAGNOSIS — D631 Anemia in chronic kidney disease: Secondary | ICD-10-CM | POA: Diagnosis not present

## 2014-12-02 DIAGNOSIS — E1121 Type 2 diabetes mellitus with diabetic nephropathy: Secondary | ICD-10-CM | POA: Diagnosis not present

## 2014-12-02 DIAGNOSIS — I739 Peripheral vascular disease, unspecified: Secondary | ICD-10-CM | POA: Diagnosis not present

## 2014-12-02 DIAGNOSIS — I120 Hypertensive chronic kidney disease with stage 5 chronic kidney disease or end stage renal disease: Secondary | ICD-10-CM

## 2014-12-02 DIAGNOSIS — T8131XD Disruption of external operation (surgical) wound, not elsewhere classified, subsequent encounter: Secondary | ICD-10-CM

## 2014-12-02 DIAGNOSIS — E46 Unspecified protein-calorie malnutrition: Secondary | ICD-10-CM

## 2014-12-02 DIAGNOSIS — Z87891 Personal history of nicotine dependence: Secondary | ICD-10-CM

## 2014-12-02 DIAGNOSIS — Z7901 Long term (current) use of anticoagulants: Secondary | ICD-10-CM | POA: Diagnosis not present

## 2014-12-02 DIAGNOSIS — N186 End stage renal disease: Secondary | ICD-10-CM | POA: Diagnosis not present

## 2014-12-02 DIAGNOSIS — I97618 Postprocedural hemorrhage and hematoma of a circulatory system organ or structure following other circulatory system procedure: Secondary | ICD-10-CM

## 2014-12-02 DIAGNOSIS — E1122 Type 2 diabetes mellitus with diabetic chronic kidney disease: Secondary | ICD-10-CM

## 2014-12-02 DIAGNOSIS — Z86718 Personal history of other venous thrombosis and embolism: Secondary | ICD-10-CM

## 2014-12-02 DIAGNOSIS — Z794 Long term (current) use of insulin: Secondary | ICD-10-CM

## 2014-12-02 DIAGNOSIS — Z7952 Long term (current) use of systemic steroids: Secondary | ICD-10-CM

## 2014-12-02 DIAGNOSIS — S7011XS Contusion of right thigh, sequela: Secondary | ICD-10-CM

## 2014-12-02 DIAGNOSIS — R5381 Other malaise: Principal | ICD-10-CM | POA: Diagnosis present

## 2014-12-02 DIAGNOSIS — E1129 Type 2 diabetes mellitus with other diabetic kidney complication: Secondary | ICD-10-CM | POA: Diagnosis not present

## 2014-12-02 DIAGNOSIS — Y832 Surgical operation with anastomosis, bypass or graft as the cause of abnormal reaction of the patient, or of later complication, without mention of misadventure at the time of the procedure: Secondary | ICD-10-CM | POA: Diagnosis not present

## 2014-12-02 DIAGNOSIS — I252 Old myocardial infarction: Secondary | ICD-10-CM | POA: Diagnosis not present

## 2014-12-02 DIAGNOSIS — Z992 Dependence on renal dialysis: Secondary | ICD-10-CM

## 2014-12-02 DIAGNOSIS — Z79899 Other long term (current) drug therapy: Secondary | ICD-10-CM

## 2014-12-02 DIAGNOSIS — Z941 Heart transplant status: Secondary | ICD-10-CM

## 2014-12-02 DIAGNOSIS — R269 Unspecified abnormalities of gait and mobility: Secondary | ICD-10-CM

## 2014-12-02 DIAGNOSIS — D696 Thrombocytopenia, unspecified: Secondary | ICD-10-CM | POA: Diagnosis not present

## 2014-12-02 DIAGNOSIS — S7011XA Contusion of right thigh, initial encounter: Secondary | ICD-10-CM | POA: Diagnosis present

## 2014-12-02 DIAGNOSIS — K5900 Constipation, unspecified: Secondary | ICD-10-CM | POA: Diagnosis not present

## 2014-12-02 DIAGNOSIS — I10 Essential (primary) hypertension: Secondary | ICD-10-CM | POA: Diagnosis present

## 2014-12-02 DIAGNOSIS — M898X9 Other specified disorders of bone, unspecified site: Secondary | ICD-10-CM | POA: Diagnosis not present

## 2014-12-02 LAB — CBC
HCT: 23.8 % — ABNORMAL LOW (ref 39.0–52.0)
HEMATOCRIT: 25.8 % — AB (ref 39.0–52.0)
Hemoglobin: 7.4 g/dL — ABNORMAL LOW (ref 13.0–17.0)
Hemoglobin: 8.1 g/dL — ABNORMAL LOW (ref 13.0–17.0)
MCH: 29 pg (ref 26.0–34.0)
MCH: 29.3 pg (ref 26.0–34.0)
MCHC: 31.1 g/dL (ref 30.0–36.0)
MCHC: 31.4 g/dL (ref 30.0–36.0)
MCV: 93.3 fL (ref 78.0–100.0)
MCV: 93.5 fL (ref 78.0–100.0)
PLATELETS: 69 10*3/uL — AB (ref 150–400)
Platelets: 54 10*3/uL — ABNORMAL LOW (ref 150–400)
RBC: 2.55 MIL/uL — AB (ref 4.22–5.81)
RBC: 2.76 MIL/uL — ABNORMAL LOW (ref 4.22–5.81)
RDW: 19.1 % — AB (ref 11.5–15.5)
RDW: 19.3 % — AB (ref 11.5–15.5)
WBC: 4.2 10*3/uL (ref 4.0–10.5)
WBC: 4.8 10*3/uL (ref 4.0–10.5)

## 2014-12-02 LAB — RENAL FUNCTION PANEL
ALBUMIN: 2.4 g/dL — AB (ref 3.5–5.0)
ALBUMIN: 2.6 g/dL — AB (ref 3.5–5.0)
ANION GAP: 10 (ref 5–15)
Anion gap: 7 (ref 5–15)
BUN: 23 mg/dL — ABNORMAL HIGH (ref 6–20)
BUN: 27 mg/dL — AB (ref 6–20)
CHLORIDE: 101 mmol/L (ref 101–111)
CHLORIDE: 100 mmol/L — AB (ref 101–111)
CO2: 28 mmol/L (ref 22–32)
CO2: 26 mmol/L (ref 22–32)
CREATININE: 6.67 mg/dL — AB (ref 0.61–1.24)
CREATININE: 7.42 mg/dL — AB (ref 0.61–1.24)
Calcium: 9.2 mg/dL (ref 8.9–10.3)
Calcium: 9.8 mg/dL (ref 8.9–10.3)
GFR calc Af Amer: 9 mL/min — ABNORMAL LOW (ref >60)
GFR calc Af Amer: 8 mL/min — ABNORMAL LOW (ref >60)
GFR calc non Af Amer: 8 mL/min — ABNORMAL LOW (ref >60)
GFR calc non Af Amer: 7 mL/min — ABNORMAL LOW (ref >60)
Glucose, Bld: 100 mg/dL — ABNORMAL HIGH (ref 65–99)
Glucose, Bld: 123 mg/dL — ABNORMAL HIGH (ref 65–99)
POTASSIUM: 3.3 mmol/L — AB (ref 3.5–5.1)
Phosphorus: 2.7 mg/dL (ref 2.5–4.6)
Phosphorus: 2.8 mg/dL (ref 2.5–4.6)
Potassium: 3.3 mmol/L — ABNORMAL LOW (ref 3.5–5.1)
Sodium: 136 mmol/L (ref 135–145)
Sodium: 136 mmol/L (ref 135–145)

## 2014-12-02 LAB — GLUCOSE, CAPILLARY
GLUCOSE-CAPILLARY: 91 mg/dL (ref 65–99)
Glucose-Capillary: 114 mg/dL — ABNORMAL HIGH (ref 65–99)
Glucose-Capillary: 81 mg/dL (ref 65–99)
Glucose-Capillary: 118 mg/dL — ABNORMAL HIGH (ref 65–99)

## 2014-12-02 LAB — PREPARE RBC (CROSSMATCH)

## 2014-12-02 LAB — PROTIME-INR
INR: 1.62 — ABNORMAL HIGH (ref 0.00–1.49)
Prothrombin Time: 19.3 seconds — ABNORMAL HIGH (ref 11.6–15.2)

## 2014-12-02 LAB — HEPARIN LEVEL (UNFRACTIONATED): Heparin Unfractionated: 0.39 IU/mL (ref 0.30–0.70)

## 2014-12-02 MED ORDER — COLESEVELAM HCL 625 MG PO TABS
3750.0000 mg | ORAL_TABLET | Freq: Every day | ORAL | Status: DC
  Administered 2014-12-03 – 2014-12-14 (×12): 3750 mg via ORAL
  Filled 2014-12-02 (×16): qty 6

## 2014-12-02 MED ORDER — PANTOPRAZOLE SODIUM 40 MG PO TBEC
40.0000 mg | DELAYED_RELEASE_TABLET | Freq: Every day | ORAL | Status: DC
  Administered 2014-12-03 – 2014-12-11 (×9): 40 mg via ORAL
  Filled 2014-12-02 (×9): qty 1

## 2014-12-02 MED ORDER — FLEET ENEMA 7-19 GM/118ML RE ENEM
1.0000 | ENEMA | Freq: Once | RECTAL | Status: AC | PRN
  Filled 2014-12-02: qty 1

## 2014-12-02 MED ORDER — FAMOTIDINE 10 MG PO TABS
10.0000 mg | ORAL_TABLET | Freq: Every day | ORAL | Status: DC
  Administered 2014-12-02 – 2014-12-13 (×12): 10 mg via ORAL
  Filled 2014-12-02 (×16): qty 1

## 2014-12-02 MED ORDER — TRAZODONE HCL 50 MG PO TABS: 25.0000 mg | ORAL_TABLET | Freq: Every evening | ORAL | Status: DC | PRN

## 2014-12-02 MED ORDER — INSULIN ASPART 100 UNIT/ML ~~LOC~~ SOLN: 0.0000 [IU] | Freq: Every day | SUBCUTANEOUS | Status: DC

## 2014-12-02 MED ORDER — AZATHIOPRINE 50 MG PO TABS
75.0000 mg | ORAL_TABLET | Freq: Every morning | ORAL | Status: DC
Start: 2014-12-03 — End: 2014-12-14
  Administered 2014-12-03 – 2014-12-14 (×12): 75 mg via ORAL
  Filled 2014-12-02 (×13): qty 2

## 2014-12-02 MED ORDER — DOXERCALCIFEROL 4 MCG/2ML IV SOLN
INTRAVENOUS | Status: AC
  Filled 2014-12-02: qty 2

## 2014-12-02 MED ORDER — SENNOSIDES-DOCUSATE SODIUM 8.6-50 MG PO TABS
2.0000 | ORAL_TABLET | Freq: Every day | ORAL | Status: DC
  Administered 2014-12-02 – 2014-12-03 (×2): 2 via ORAL
  Filled 2014-12-02 (×2): qty 2

## 2014-12-02 MED ORDER — HEPARIN (PORCINE) IN NACL 100-0.45 UNIT/ML-% IJ SOLN
1200.0000 [IU]/h | INTRAMUSCULAR | Status: DC
  Administered 2014-12-03: 1150 [IU]/h via INTRAVENOUS
  Administered 2014-12-03: 1200 [IU]/h via INTRAVENOUS
  Filled 2014-12-02 (×3): qty 250

## 2014-12-02 MED ORDER — WARFARIN SODIUM 5 MG PO TABS: ORAL_TABLET | ORAL | Status: AC

## 2014-12-02 MED ORDER — DOXERCALCIFEROL 4 MCG/2ML IV SOLN
4.0000 ug | INTRAVENOUS | Status: DC
  Administered 2014-12-05 – 2014-12-14 (×5): 4 ug via INTRAVENOUS
  Filled 2014-12-02 (×5): qty 2

## 2014-12-02 MED ORDER — OXYCODONE HCL 5 MG PO TABS
5.0000 mg | ORAL_TABLET | ORAL | Status: DC | PRN
  Administered 2014-12-03 – 2014-12-13 (×5): 5 mg via ORAL
  Filled 2014-12-02 (×6): qty 1

## 2014-12-02 MED ORDER — BISACODYL 10 MG RE SUPP: 10.0000 mg | Freq: Every day | RECTAL | Status: DC | PRN

## 2014-12-02 MED ORDER — SORBITOL 70 % SOLN
30.0000 mL | Freq: Two times a day (BID) | Status: DC | PRN
  Administered 2014-12-06: 30 mL via ORAL
  Filled 2014-12-02: qty 30

## 2014-12-02 MED ORDER — RENA-VITE PO TABS
1.0000 | ORAL_TABLET | Freq: Every day | ORAL | Status: DC
  Administered 2014-12-02 – 2014-12-13 (×12): 1 via ORAL
  Filled 2014-12-02 (×15): qty 1

## 2014-12-02 MED ORDER — ALUMINUM HYDROXIDE GEL 320 MG/5ML PO SUSP
10.0000 mL | Freq: Four times a day (QID) | ORAL | Status: DC | PRN
  Filled 2014-12-02: qty 30

## 2014-12-02 MED ORDER — SODIUM CHLORIDE 0.9 % IV SOLN: Freq: Once | INTRAVENOUS | Status: DC

## 2014-12-02 MED ORDER — HEPARIN (PORCINE) IN NACL 100-0.45 UNIT/ML-% IJ SOLN: INTRAMUSCULAR | Status: DC

## 2014-12-02 MED ORDER — PRO-STAT SUGAR FREE PO LIQD
30.0000 mL | Freq: Two times a day (BID) | ORAL | Status: DC
  Administered 2014-12-03 – 2014-12-10 (×7): 30 mL via ORAL
  Filled 2014-12-02 (×26): qty 30

## 2014-12-02 MED ORDER — DIPHENHYDRAMINE HCL 12.5 MG/5ML PO ELIX
12.5000 mg | ORAL_SOLUTION | Freq: Four times a day (QID) | ORAL | Status: DC | PRN
  Filled 2014-12-02: qty 10

## 2014-12-02 MED ORDER — ATORVASTATIN CALCIUM 40 MG PO TABS
40.0000 mg | ORAL_TABLET | Freq: Every day | ORAL | Status: DC
  Administered 2014-12-03 – 2014-12-14 (×12): 40 mg via ORAL
  Filled 2014-12-02 (×14): qty 1

## 2014-12-02 MED ORDER — SIROLIMUS 0.5 MG PO TABS
1.5000 mg | ORAL_TABLET | Freq: Every day | ORAL | Status: DC
  Administered 2014-12-03 – 2014-12-14 (×12): 1.5 mg via ORAL
  Filled 2014-12-02 (×3): qty 3

## 2014-12-02 MED ORDER — CALCIUM ACETATE (PHOS BINDER) 667 MG PO CAPS
667.0000 mg | ORAL_CAPSULE | Freq: Two times a day (BID) | ORAL | Status: DC
  Administered 2014-12-03 – 2014-12-14 (×22): 667 mg via ORAL
  Filled 2014-12-02 (×26): qty 1

## 2014-12-02 MED ORDER — WARFARIN SODIUM 5 MG PO TABS
5.0000 mg | ORAL_TABLET | Freq: Once | ORAL | Status: DC
  Filled 2014-12-02: qty 1

## 2014-12-02 MED ORDER — ACETAMINOPHEN 325 MG PO TABS
325.0000 mg | ORAL_TABLET | ORAL | Status: DC | PRN
  Filled 2014-12-02: qty 2

## 2014-12-02 MED ORDER — CARVEDILOL 6.25 MG PO TABS
6.2500 mg | ORAL_TABLET | Freq: Every day | ORAL | Status: DC
  Administered 2014-12-02 – 2014-12-13 (×12): 6.25 mg via ORAL
  Filled 2014-12-02 (×15): qty 1

## 2014-12-02 MED ORDER — GUAIFENESIN-DM 100-10 MG/5ML PO SYRP
5.0000 mL | ORAL_SOLUTION | Freq: Four times a day (QID) | ORAL | Status: DC | PRN
  Filled 2014-12-02: qty 10

## 2014-12-02 MED ORDER — DARBEPOETIN ALFA 200 MCG/0.4ML IJ SOSY
200.0000 ug | PREFILLED_SYRINGE | INTRAMUSCULAR | Status: DC
  Administered 2014-12-07 – 2014-12-14 (×2): 200 ug via INTRAVENOUS
  Filled 2014-12-02 (×2): qty 0.4

## 2014-12-02 MED ORDER — OXYCODONE HCL 5 MG PO TABS: 5.0000 mg | ORAL_TABLET | ORAL | Status: DC | PRN

## 2014-12-02 MED ORDER — ONDANSETRON HCL 4 MG PO TABS: 4.0000 mg | ORAL_TABLET | Freq: Four times a day (QID) | ORAL | Status: DC | PRN

## 2014-12-02 MED ORDER — ONDANSETRON HCL 4 MG/2ML IJ SOLN: 4.0000 mg | Freq: Four times a day (QID) | INTRAMUSCULAR | Status: DC | PRN

## 2014-12-02 MED ORDER — WARFARIN SODIUM 5 MG PO TABS
5.0000 mg | ORAL_TABLET | Freq: Once | ORAL | Status: AC
  Administered 2014-12-02: 5 mg via ORAL
  Filled 2014-12-02: qty 1

## 2014-12-02 MED ORDER — ISOSORBIDE DINITRATE ER 40 MG PO CPCR
40.0000 mg | ORAL_CAPSULE | Freq: Three times a day (TID) | ORAL | Status: DC
  Administered 2014-12-02 – 2014-12-13 (×31): 40 mg via ORAL
  Filled 2014-12-02 (×39): qty 1

## 2014-12-02 MED ORDER — WARFARIN - PHARMACIST DOSING INPATIENT: Freq: Every day | Status: DC

## 2014-12-02 MED ORDER — PREDNISONE 5 MG PO TABS
5.0000 mg | ORAL_TABLET | Freq: Every day | ORAL | Status: DC
  Administered 2014-12-03 – 2014-12-14 (×12): 5 mg via ORAL
  Filled 2014-12-02 (×14): qty 1

## 2014-12-02 MED ORDER — PANTOPRAZOLE SODIUM 40 MG PO TBEC: 40.0000 mg | DELAYED_RELEASE_TABLET | Freq: Every day | ORAL | Status: AC

## 2014-12-02 NOTE — Discharge Instructions (Signed)
Follow with Primary MD [name], MD after discharge from CIR    Activity: As tolerated with Full fall precautions use walker/cane & assistance as needed   Disposition CIR   Diet: Renal/carbohydrate modified diet with 1200 mL fluid restriction , with feeding assistance and aspiration precautions.  For Heart failure patients - Check your Weight same time everyday, if you gain over 2 pounds, or you develop in leg swelling, experience more shortness of breath or chest pain, call your Primary MD immediately. Follow Cardiac Low Salt Diet and 1.5 lit/day fluid restriction.   On your next visit with your primary care physician please Get Medicines reviewed and adjusted.   Please request your Prim.MD to go over all Hospital Tests and Procedure/Radiological results at the follow up, please get all Hospital records sent to your Prim MD by signing hospital release before you go home.   If you experience worsening of your admission symptoms, develop shortness of breath, life threatening emergency, suicidal or homicidal thoughts you must seek medical attention immediately by calling 911 or calling your MD immediately  if symptoms less severe.  You Must read complete instructions/literature along with all the possible adverse reactions/side effects for all the Medicines you take and that have been prescribed to you. Take any new Medicines after you have completely understood and accpet all the possible adverse reactions/side effects.   Do not drive, operating heavy machinery, perform activities at heights, swimming or participation in water activities or provide baby sitting services if your were admitted for syncope or siezures until you have seen by Primary MD or a Neurologist and advised to do so again.  Do not drive when taking Pain medications.    Do not take more than prescribed Pain, Sleep and Anxiety Medications  Special Instructions: If you have smoked or chewed Tobacco  in the last  2 yrs please stop smoking, stop any regular Alcohol  and or any Recreational drug use.  Wear Seat belts while driving.   Please note  You were cared for by a hospitalist during your hospital stay. If you have any questions about your discharge medications or the care you received while you were in the hospital after you are discharged, you can call the unit and asked to speak with the hospitalist on call if the hospitalist that took care of you is not available. Once you are discharged, your primary care physician will handle any further medical issues. Please note that NO REFILLS for any discharge medications will be authorized once you are discharged, as it is imperative that you return to your primary care physician (or establish a relationship with a primary care physician if you do not have one) for your aftercare needs so that they can reassess your need for medications and monitor your lab values.

## 2014-12-02 NOTE — Progress Notes (Signed)
ANTICOAGULATION CONSULT NOTE - Follow Up Consult  Pharmacy Consult for heparin and warfarin Indication: History of DVT (on Coumadin PTA since March 2016)   Allergies  Allergen Reactions  . Lisinopril Swelling    Lips and tongue swell  . Niacin And Related Other (See Comments)    unknown  . Norvasc [Amlodipine Besylate] Rash    Flushing  . Penicillins Rash    Patient Measurements: Height: 6' (182.9 cm) Weight: 152 lb 8.9 oz (69.2 kg) IBW/kg (Calculated) : 77.6   Vital Signs: Temp: 98.5 F (36.9 C) (07/08 0652) Temp Source: Oral (07/08 0652) BP: 138/48 mmHg (07/08 0652) Pulse Rate: 62 (07/08 0652)  Labs:  Recent Labs  11/30/14 0355 11/30/14 1000 12/01/14 0340 12/02/14 0312  HGB 8.1* 7.9* 7.9* 7.4*  HCT 26.0* 25.2* 25.1* 23.8*  PLT 59* 64* 54* 54*  LABPROT  --   --  16.5* 19.3*  INR  --   --  1.32 1.62*  HEPARINUNFRC 0.46  --  0.41 0.39  CREATININE  --  8.18*  --  6.67*    Estimated Creatinine Clearance: 10.2 mL/min (by C-G formula based on Cr of 6.67).   Assessment: 69 y.o. on Coumadin PTA for hx of DVT(March 2016), s/p hematoma evacuation 6/27. 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 Patient has elected for observation at this time and no surgery is planned. HL is currently therapeutic at 0.39. INR subtherapeutic but trending up to 1.62. Hgb low (7.4) but stable. Thrombocytopenia stable. No bleeding noted.   Goal of Therapy:  INR 2-3 Heparin level 0.3-0.7 units/ml Monitor platelets by anticoagulation protocol: Yes   Plan:  Continue heparin gtt at 1,150 units/hr Give coumadin 5mg  PO x 1  Monitor daily HL, INR, CBC, s/s of bleed   Assunta Found Tammye Kahler 12/02/2014,8:46 AM

## 2014-12-02 NOTE — Progress Notes (Signed)
OT Cancellation Note  Patient Details Name: Brandon Sharp MRN: [id number] DOB: 08-04-1945   Cancelled Treatment:    Reason Eval/Treat Not Completed: OT screened, no needs identified, will sign off - defer OT eval to CIR.   Darlina Rumpf [place], OTR/L [phone]  12/02/2014, 1:30 PM

## 2014-12-02 NOTE — Care Management Note (Signed)
Case Management Note  Patient Details  Name: Brandon Sharp MRN: [id number] Date of Birth: November 11, 1945  Subjective/Objective: Pt admitted with symptomatic anemia               Action/Plan:  Pt is from home.  Pt had wound vac placed during this admit. Discharge home plan includes HHRN and wound vac with [employer].  CM will assist and will continue to monitor for disposition   Expected Discharge Date:                  Expected Discharge Plan:  Home/Self Care  In-House Referral:  Clinical Social Work  Discharge planning Services  CM Consult  Post Acute Care Choice:    Choice offered to:  Patient  DME Arranged:    DME Agency:     HH Arranged:  HHRN (wound vac) HH Agency:  [facility]  Status of Service:  Complete, will sign off Medicare Important Message Given:  Yes-second notification given Date Medicare IM Given:    Medicare IM give by:    Date Additional Medicare IM Given:    Additional Medicare Important Message give by:     If discussed at [place] of Stay Meetings, dates discussed:  12/01/14  Additional Comments: 12/02/14 [name], RN, BSN [phone] Pt discharged to [place] 12/02/14, CIR aware of wound vac supplied by [facility]  12/01/14 [name], RN, BSN [phone] Home wound vac delivered to pts room during the evening of 10/31/14.    11/30/14  CM contacted [employer] resource as follow up to referral, informed of pending discharge.  CM was informed by [name] that due to this pt's zipcode; this referral has been outsourced to [name] [phone].  [employer] requested CM to contact company and verify referral was accepted, CM was told yesterday by resource that referral was already accepted, without questions or concerns).  CM contacted [facility] and was informed that referral information had not been received from [facility], company requested CM fax referral documents.  CM contacted [employer], was assured fax was sent at approximately 3pm 11/29/14, resource stated that  she would ensure fax would be resent and she would follow back up with confirmation on fax being sent with CM.  CM faxed [facility] directly with requested documents.  CM followed up with [name] at [facility]) and verified fax was sent and referral was accepted, company informed of possible discharge tomorrow, will continue to follow and inform [facility] of discharge when known.  11/29/14 CM faxed information to [facility], referral was accepted.  CM offered pt choice for [facility], pt chose [facility], referral accepted.  CM will continue to monitor for disposition needs [name], RN 12/02/2014, 3:27 PM

## 2014-12-02 NOTE — PMR Pre-admission (Signed)
PMR Admission Coordinator Pre-Admission Assessment  Patient: Brandon Sharp is an 69 y.o., male MRN: [id number] DOB: [birth year] Height: 6' (182.9 cm) Weight: 70.5 kg (155 lb 6.8 oz)              Insurance Information HMO:     PPO:      PCP:      IPA:      80/20: yes     OTHER:  No HMO PRIMARY: Medicare a and b      Policy#: [id number]      Subscriber: pt Benefits:  Phone #: palmetto online     Name: 12/02/14 Eff. Date: 01/26/2000     Deduct: $1288      Out of Pocket Max: none      Life Max: none CIR: 100%      SNF: 20 full days Outpatient: 80%     Co-Pay: 20% Home Health: 100%      Co-Pay: none DME: 80%     Co-Pay: 20% Providers: pt choice  SECONDARY: A[employer]      Policy#: [id number]      Subscriber: pt  Medicaid Application Date:       Case Manager:  Disability Application Date:       Case Worker:   Emergency [place]    Name Relation Home Work Mobile   [place] Spouse [phone]  [phone]     Current Medical History  Patient Admitting Diagnosis: right thigh hematoma, PAD with subsequent weakness/gait instability  History of Present Illness: Brandon Sharp is a 69 y.o. male with history of DM type 2, CAD, CHF s/p Cardiac transplant, ESRD who was admitted on 11/16/14 from HD center due to profound weakness. He was found to be anemic due to right thigh hematoma s/p recent AVG placement. He was transfused with 2 units PRBC and was taken to OR on 11/21/14 for evacuation of Thigh hematoma with VAC placement. He developed right foot ischemia and pain therefore arteriogram recommended due to concerns of steal syndrome. He underwent aortogram with attempts at recanalization of R-SFA stent and evidence of medium length occlusion. Patient has elected on observation for now v/s R-FPBG or ligation of RLE AVG.   Past Medical History  Past Medical History  Diagnosis Date  . Diabetes mellitus   . Hypertension   . Myocardial infarction 1985; 1990  .  Angina   . Dysrhythmia   . DVT (deep venous thrombosis) ~ 12/2010    LLE  . Anemia   . Blood transfusion 06/1999    post heart transplant  . Peripheral vascular disease   . Renal failure     Hemodialysis MWF last 2 years, sse dr [name] nephrology, goes to [facility]  . DVT (deep venous thrombosis) 08/25/2014    Nearly occluded R IJ, and L subclavian DVT  . CHF (congestive heart failure) 2001    beffore transplant; had AICD that was explanted 07/19/99 following successful heart transplant  . Coronary artery disease     transplant cardiologist Dr. Corky Mull [facility])  . Pneumonia     Family History  family history includes Diabetes in his brother and mother; Heart attack in his brother; Heart disease in his brother, father, and mother; Hyperlipidemia in his brother, father, and mother; Hypertension in his brother, father, and mother.  Prior Rehab/Hospitalizations:  Has the patient had major surgery during 100 days prior to admission? Yes  Current Medications   Current facility-administered medications:  .  0.9 %  sodium chloride infusion, , Intravenous, Continuous, Conrad Lewellen, MD, Last Rate: 10 mL/hr at 11/26/14 0006, 10 mL/hr at 11/26/14 0006 .  0.9 %  sodium chloride infusion, , Intravenous, Once, Ripudeep K Rai, MD .  0.9 %  sodium chloride infusion, , Intravenous, Once, Albertine Patricia, MD .  acetaminophen (TYLENOL) tablet 325-650 mg, 325-650 mg, Oral, Q4H PRN, 650 mg at 11/27/14 2253 **OR** acetaminophen (TYLENOL) suppository 325-650 mg, 325-650 mg, Rectal, Q4H PRN, Elam Dutch, MD .  atorvastatin (LIPITOR) tablet 40 mg, 40 mg, Oral, Daily, Erline Hau, MD, 40 mg at 12/02/14 1025 .  azaTHIOprine (IMURAN) tablet 75 mg, 75 mg, Oral, q morning - 10a, Estela Leonie Green, MD, 75 mg at 12/02/14 1024 .  bisacodyl (DULCOLAX) EC tablet 10 mg, 10 mg, Oral, Daily, Albertine Patricia, MD, 10 mg at 12/02/14 1024 .  calcium acetate (PHOSLO)  capsule 667 mg, 667 mg, Oral, BID WC, Roney Jaffe, MD, 667 mg at 12/02/14 0847 .  carvedilol (COREG) tablet 6.25 mg, 6.25 mg, Oral, QHS, Erline Hau, MD, 6.25 mg at 12/01/14 2219 .  colesevelam [facility]) tablet 3,750 mg, 3,750 mg, Oral, Daily, Erline Hau, MD, 3,750 mg at 12/02/14 1025 .  Darbepoetin Alfa (ARANESP) injection 200 mcg, 200 mcg, Intravenous, Q Wed-HD, Ernest Haber, PA-C, 200 mcg at 11/30/14 1233 .  docusate sodium (COLACE) capsule 100 mg, 100 mg, Oral, Daily, Elam Dutch, MD, 100 mg at 12/02/14 1025 .  doxercalciferol (HECTOROL) injection 4 mcg, 4 mcg, Intravenous, Q M,W,F-HD, Alric Seton, PA-C, 4 mcg at 11/30/14 1233 .  famotidine (PEPCID) tablet 10 mg, 10 mg, Oral, QHS, Erline Hau, MD, 10 mg at 12/01/14 2221 .  feeding supplement (PRO-STAT SUGAR FREE 64) liquid 30 mL, 30 mL, Oral, BID, Dale Bettendorf, RD, 30 mL at 11/24/14 1119 .  feeding supplement (RESOURCE BREEZE) (RESOURCE BREEZE) liquid 1 Container, 1 Container, Oral, BID BM, Dale [place], RD, 1 Container at 11/28/14 [phone] .  heparin ADULT infusion 100 units/mL (25000 units/250 mL), 1,150 Units/hr, Intravenous, Continuous, Reginia Naas, [facility], Last Rate: 11.5 mL/hr at 12/02/14 0855, 1,150 Units/hr at 12/02/14 0855 .  hydrALAZINE (APRESOLINE) injection 5 mg, 5 mg, Intravenous, Q20 Min PRN, Elam Dutch, MD .  insulin aspart (novoLOG) injection 0-5 Units, 0-5 Units, Subcutaneous, QHS, Estela Leonie Green, MD, 0 Units at 11/16/14 2200 .  insulin aspart (novoLOG) injection 0-9 Units, 0-9 Units, Subcutaneous, TID WC, Estela Leonie Green, MD, 1 Units at 11/29/14 1209 .  isosorbide dinitrate (DILATRATE-SR) capsule 40 mg, 40 mg, Oral, 3 times per day, Erline Hau, MD, 40 mg at 12/02/14 4315 .  labetalol (NORMODYNE,TRANDATE) injection 10 mg, 10 mg, Intravenous, Q10 min PRN, Elam Dutch, MD .  metoprolol (LOPRESSOR) injection 2-5 mg, 2-5  mg, Intravenous, Q2H PRN, Elam Dutch, MD .  multivitamin (RENA-VIT) tablet 1 tablet, 1 tablet, Oral, QHS, Erline Hau, MD, 1 tablet at 12/01/14 2219 .  ondansetron (ZOFRAN) injection 4 mg, 4 mg, Intravenous, Q6H PRN, Elam Dutch, MD .  ondansetron [facility]) tablet 4 mg, 4 mg, Oral, Q6H PRN **OR** [DISCONTINUED] ondansetron (ZOFRAN) injection 4 mg, 4 mg, Intravenous, Q6H PRN, Erline Hau, MD .  oxyCODONE (Oxy IR/ROXICODONE) immediate release tablet 5 mg, 5 mg, Oral, Q4H PRN, Erline Hau, MD, 5 mg at 11/27/14 2143 .  pantoprazole (PROTONIX) EC tablet 40 mg, 40 mg, Oral, Daily,  Elam Dutch, MD, 40 mg at 12/02/14 1025 .  polyethylene glycol (MIRALAX / GLYCOLAX) packet 17 g, 17 g, Oral, BID, Albertine Patricia, MD, 17 g at 12/02/14 1024 .  predniSONE (DELTASONE) tablet 5 mg, 5 mg, Oral, Q breakfast, Erline Hau, MD, 5 mg at 12/02/14 0847 .  senna-docusate (Senokot-S) tablet 1 tablet, 1 tablet, Oral, QHS PRN, Erline Hau, MD, 1 tablet at 12/01/14 0115 .  Sirolimus TABS 1.5 mg, 1.5 mg, Oral, Daily, Ripudeep K Rai, MD, 1.5 mg at 12/02/14 1024 .  sodium chloride 0.9 % injection 10-40 mL, 10-40 mL, Intracatheter, PRN, Corliss Parish, MD, 20 mL at 11/26/14 2343 .  warfarin (COUMADIN) tablet 5 mg, 5 mg, Oral, ONCE-1800, Ricka Burdock, RPH .  Warfarin - Pharmacist Dosing Inpatient, , Does not apply, q1800, Reginia Naas, RPH, 0  at 11/30/14 1551  Patients Current Diet: Diet renal/carb modified with fluid restriction Diet-HS Snack?: Nothing; Room service appropriate?: Yes; Fluid consistency:: Thin  Precautions / Restrictions Precautions Precautions: Fall Precaution Comments: wound vac R groin Restrictions Weight Bearing Restrictions: No   Has the patient had 2 or more falls or a fall with injury in the past year?No  Prior Activity Level Community (5-7x/wk): HD MWF, drives self.  Pt drove self to OP HD MWF  [facility]. Used a cane. Over past month gradual decline in function. States he did his own adls. Had [facility] therapy twice this year, but pt and wife state it was not enough intensity.  Home Assistive Devices / Equipment Home Assistive Devices/Equipment: Eyeglasses Home Equipment: Cane - single point, Shower seat  Prior Device Use: Indicate devices/aids used by the patient prior to current illness, exacerbation or injury? cane  Prior Functional Level Prior Function Level of Independence: Independent with assistive device(s) Comments: wife states she helps him some with bathing and dressing.  Pts wife states that pt did very well with HHPT back in November, however had difficulty maintaining active lifestyle and has become more reliant on her.  She now has some health issues of her own and is unable to assist as she did before.   Self Care: Did the patient need help bathing, dressing, using the toilet or eating?  Needed some help  Indoor Mobility: Did the patient need assistance with walking from room to room (with or without device)? Independent  Stairs: Did the patient need assistance with internal or external stairs (with or without device)? Independent  Functional Cognition: Did the patient need help planning regular tasks such as shopping or remembering to take medications? Independent  Current Functional Level Cognition  Overall Cognitive Status: Within Functional Limits for tasks assessed Orientation Level: Oriented X4    Extremity Assessment (includes Sensation/Coordination)     Lower Extremity Assessment: Generalized weakness    ADLs       Mobility  Overal bed mobility: Modified Independent General bed mobility comments: with HOB flat and without rails to better simulate home environment    Transfers  Overall transfer level: Needs assistance Equipment used: Rolling walker (2 wheeled) Transfers: Sit to/from Stand Sit to Stand: Min assist General transfer  comment: Min A for facilitation of foward weight shift with cues for hand placement on bed instead of pulling up on RW.     Ambulation / Gait / Stairs / Wheelchair Mobility  Ambulation/Gait Ambulation/Gait assistance: Min assist, Mod assist (single instance of LOB requiring mod A ) Ambulation Distance (Feet): 20 Feet (x 4 reps (to wall  across from room twice with standing rest break)) Assistive device: Rolling walker (2 wheeled) General Gait Details: Pt requires mod verbal cues for posture throughout gait along with safe when negotiating with RW, esp through tight spaces.  Single instance of LOB posteriorly when backing up to recliner, requiring mod A to correct.  Gait Pattern/deviations: Decreased stride length, Trunk flexed, Narrow base of support, Shuffle Gait velocity: decreased    Posture / Balance Balance Overall balance assessment: Needs assistance Sitting-balance support: Feet supported, No upper extremity supported Sitting balance-Leahy Scale: Good Standing balance support: Bilateral upper extremity supported, During functional activity Standing balance-Leahy Scale: Poor Standing balance comment: Pt requires min to mod A along with use of RW to maintain balance during functional mobility.     Special needs/care consideration Continuous Drip IV heparin IV as a transition to coumadin DialysisMWF Wound Vac (area) R Thigh      Location  Tenna Child, RN Registered Nurse Addendum WOC Consult Note 11/23/2014 10:12 AM    Expand All Collapse All   WOC wound consult note Pt is followed by VVS service for assessment and plan of care to right groin wound. Reason for Consult: Consult requested for right groin Vac dressing change. Dr Bridgett Larsson at the bedside to assess the wound during the first post-op dressing change. Wound type: Full thickness post-op incision Measurement: 1X7X.8cm Wound bed: 100% beefy red Drainage (amount, consistency, odor) Small amt pink drainage in the cannister, no  odor Periwound: Intact skin surrounding Dressing procedure/placement/frequency: Applied one piece of black foam to 161mm cont suction. Pt tolerated with minimal c/o pain. Plan for bedside nurses to change Q M/W/F. Please re-consult if further assistance is needed. Gae Dry MSN, RN, [place], [place], [place]       Revision History     Date/Time User Provider Type Action   11/23/2014 10:17 AM Tenna Child, RN Registered Nurse Addend   11/23/2014 10:16 AM Arrie Aran Alger Simons, RN Registered Nurse Sign   View Details Report              Skin HD catheter left chest and r thigh wound with VAC Bowel mgmt: no BM since admission per pt. Typically q 2-3 days Bladder mgmt: no urine DM yes   Previous Home Environment Living Arrangements: Spouse/significant other  Lives With: Spouse Available Help at Discharge: Family, Available 24 hours/day Type of Home: House Home Layout: One level Home Access: Stairs to enter Entrance Stairs-Rails: None Entrance Stairs-Number of Steps: 2 Bathroom Shower/Tub: Multimedia programmer: Programmer, systems: Yes How Accessible: Accessible via walker Home Care Services: No Additional Comments: has a stool at his sink, did not ambulate much in the community   Discharge Living Setting Plans for Discharge Living Setting: Patient's home, Lives with (comment), Other (Comment) (spouse) Type of Home at Discharge: House Discharge Home Layout: One level Discharge Home Access: Stairs to enter Entrance Stairs-Rails: None Entrance Stairs-Number of Steps: 2 Discharge Bathroom Shower/Tub: Horticulturist, commercial: Standard Discharge Bathroom Accessibility: Yes How Accessible: Accessible via walker Does the patient have any problems obtaining your medications?: No  Social/Family/Support Systems Patient Roles: Spouse, Parent, Other (Comment) (retired reporter) Contact Information: Max[place]illion Banas,  wife Anticipated Caregiver: wife Anticipated Caregiver's Contact Information: see above Ability/Limitations of Caregiver: she has back issues but can provide supervision Caregiver Availability: 24/7 Discharge Plan Discussed with Primary Caregiver: Yes Is Caregiver In Agreement with Plan?: No Does Caregiver/Family have Issues with Lodging/Transportation while Pt is in Rehab?: No  Goals/Additional Needs Patient/Family Goal for Rehab: Mod I with PT and OT Expected length of stay: ELOS 7-9 days Equipment Needs: wound VAC Special Service Needs: HD MWF Pt/Family Agrees to Admission and willing to participate: Yes Program Orientation Provided & Reviewed with Pt/Caregiver Including Roles  & Responsibilities: Yes   Decrease burden of Care through IP rehab admission: n/a  Possible need for SNF placement upon discharge:no  Patient Condition: This patient's condition remains as documented in the consult dated 7/8/2016ch the Rehabilitation Physician determined and documented that the patient's condition is appropriate for intensive rehabilitative care in an inpatient rehabilitation facility. Will admit to inpatient rehab today.  Preadmission Screen Completed By:  Cleatrice Burke, 12/02/2014 12:20 PM ______________________________________________________________________   Discussed status with Dr. Naaman Plummer  On 12/02/14 at 1220 for  approval for admission today.  Admission Coordinator:  Cleatrice Burke, time 1950 12/02/2014

## 2014-12-02 NOTE — H&P (Signed)
Physical Medicine and Rehabilitation Admission H&P    Chief Complaint  Patient presents with  .    HPI: Brandon Sharp is a 69 y.o. male with history of DM type 2, CAD, CHF s/p Cardiac transplant, ESRD who was admitted on 11/16/14 from HD center due to profound weakness. He was found to be anemic due to right thigh hematoma s/p recent AVG placement. He was transfused with 2 units PRBC and was taken to OR on 11/21/14 for evacuation of Thigh hematoma with VAC placement. He developed right foot ischemia and pain therefore arteriogram recommended due to concerns of steal syndrome. He underwent aortogram with attempts at recanalization of R-SFA stent and evidence of medium length occlusion. Patient has elected on observation for now v/s R-FPBG or ligation of RLE AVG. Therapy ongoing and CIR recommended due to deconditioned state.    ROS  Constitutional: Negative for fever.  Eyes: Negative for blurred vision.  Respiratory: Negative for cough.   Cardiovascular: Negative for chest pain.  Gastrointestinal: Negative for heartburn. Nausea/vomiting past lunch today. No BM since Sunday. Genitourinary: Negative for dysuria.  Musculoskeletal: Positive for myalgias and joint pain.  Skin: Negative for rash.  Neurological: Positive for focal weakness. Negative for dizziness and headaches.  Endo/Heme/Allergies: Bruises/bleeds easily.  Psychiatric/Behavioral: Negative for depression   Past Medical History  Diagnosis Date  . Diabetes mellitus   . Hypertension   . Myocardial infarction 1985; 1990  . Angina   . Dysrhythmia   . DVT (deep venous thrombosis) ~ 12/2010    LLE  . Anemia   . Blood transfusion 06/1999    post heart transplant  . Peripheral vascular disease   . Renal failure     Hemodialysis MWF last 2 years, sse dr [name] nephrology, goes to [place] [facility]  . DVT (deep venous thrombosis) 08/25/2014    Nearly occluded R IJ, and L subclavian DVT  . CHF (congestive  heart failure) 2001    beffore transplant; had AICD that was explanted 07/19/99 following successful heart transplant  . Coronary artery disease     transplant cardiologist Dr. Corky Mull [facility])  . Pneumonia     Past Surgical History  Procedure Laterality Date  . Nephrectomy transplanted organ  2008  . Av fistula repair      rt. a=fore arm fistula  . Av fistula placement  ~ 01/2010    "this was my 2nd fistula placement"  . Heart transplant  06/1999  . Colonoscopy  03/17/2012    Procedure: COLONOSCOPY;  Surgeon: Lear Ng, MD;  Location: [facility];  Service: Endoscopy;  Laterality: N/A;  . Coronary artery bypass graft  1990    CABG X3  . Colonoscopy with propofol N/A 12/14/2013    Procedure: COLONOSCOPY WITH PROPOFOL;  Surgeon: Lear Ng, MD;  Location: [facility];  Service: Endoscopy;  Laterality: N/A;  . Video bronchoscopy Bilateral 04/14/2014    Procedure: VIDEO BRONCHOSCOPY WITH FLUORO;  Surgeon: Collene Gobble, MD;  Location: [place];  Service: Cardiopulmonary;  Laterality: Bilateral;  . Shuntogram Right 05/09/2014    Procedure: FISTULOGRAM;  Surgeon: Conrad Lakeview, MD;  Location: [facility] CATH LAB;  Service: Cardiovascular;  Laterality: Right;  . Coronary angioplasty with stent placement  1985; 1990    prior stents in the 80's and 90's then CABG; s/p heart transplant 2001 s/p BMS mid LAD and proximal RCA 04/21/13 [facility])  . Av fistula placement Right 10/20/2014    Procedure: INSERTION OF ARTERIOVENOUS (  AV) GORE-TEX GRAFT THIGH;  Surgeon: Conrad Will, MD;  Location: [place];  Service: Vascular;  Laterality: Right;  . Hematoma evacuation Right 11/21/2014    Procedure: EVACUATION HEMATOMA Right Groin;  Surgeon: Conrad Socorro, MD;  Location: [place];  Service: Vascular;  Laterality: Right;  . Application of wound vac Right 11/21/2014    Procedure: APPLICATION OF WOUND VAC Right Groin;  Surgeon: Conrad New Milford, MD;  Location: [place];  Service: Vascular;  Laterality: Right;   . Peripheral vascular catheterization N/A 11/25/2014    Procedure: Abdominal Aortogram;  Surgeon: Elam Dutch, MD;  Location: [place] CV LAB;  Service: Cardiovascular;  Laterality: N/A;  . Lower extremity angiogram Right 11/25/2014    Procedure: Lower Extremity Angiogram;  Surgeon: Elam Dutch, MD;  Location: [place] CV LAB;  Service: Cardiovascular;  Laterality: Right;    Family History  Problem Relation Age of Onset  . Heart disease Mother   . Hyperlipidemia Mother   . Hypertension Mother   . Diabetes Mother   . Hyperlipidemia Father   . Hypertension Father   . Heart disease Father   . Diabetes Brother   . Heart disease Brother     Heart Disease before age 49  . Hyperlipidemia Brother   . Hypertension Brother   . Heart attack Brother     Social History:  Married. Disabled journalist.  Independent and drives to dialysis. Has been deconditioned since last fall.  He reports that he quit smoking about 26 years ago. His smoking use included Cigarettes. He has a 20 pack-year smoking history. He has never used smokeless tobacco. He reports that he drinks alcohol. He reports that he does not use illicit drugs.   Allergies  Allergen Reactions  . Lisinopril Swelling    Lips and tongue swell  . Niacin And Related Other (See Comments)    unknown  . Norvasc [Amlodipine Besylate] Rash    Flushing  . Penicillins Rash    Medications Prior to Admission  Medication Sig Dispense Refill  . atorvastatin (LIPITOR) 40 MG tablet Take 40 mg by mouth daily.      Marland Kitchen azaTHIOprine (IMURAN) 50 MG tablet Take 75 mg by mouth every morning.     Marland Kitchen b complex-vitamin c-folic acid (NEPHRO-VITE) 0.8 MG TABS tablet Take 1 tablet by mouth at bedtime.    . calcium acetate (PHOSLO) 667 MG capsule Take 667 mg by mouth 3 (three) times daily with meals.     . carvedilol (COREG) 6.25 MG tablet Take 1 tablet (6.25 mg total) by mouth at bedtime. 30 tablet 0  . colesevelam (WELCHOL) 625 MG tablet Take  3,750 mg by mouth daily. Takes 6 tabs daily    . famotidine (PEPCID AC) 10 MG chewable tablet Chew 10 mg by mouth at bedtime.     Marland Kitchen HUMALOG KWIKPEN 100 UNIT/ML SOPN Inject 4-10 Units into the skin 2 (two) times daily. Per sliding scale    . insulin glargine (LANTUS) 100 UNIT/ML injection Inject 20 Units into the skin every morning.    . isosorbide dinitrate (ISOCHRON) 40 MG CR tablet Take 40 mg by mouth every 8 (eight) hours.  11  . predniSONE (DELTASONE) 5 MG tablet Take 1 tablet (5 mg total) by mouth daily with breakfast. 30 tablet 0  . Sirolimus 0.5 MG TABS Take 3 tablets by mouth daily.  11  . [DISCONTINUED] warfarin (COUMADIN) 5 MG tablet Take 1 tablet (5 mg total) by mouth daily. 30 tablet  0    Home: Home Living Family/patient expects to be discharged to:: Private residence Living Arrangements: Spouse/significant other Available Help at Discharge: Family, Available 24 hours/day Type of Home: House Home Access: Stairs to enter [employer] of Steps: 2 Entrance Stairs-Rails: None Home Layout: One level Home Equipment: Cane - single point, Shower seat Additional Comments: has a stool at his sink, did not ambulate much in the community   Lives With: Spouse   Functional History: Prior Function Level of Independence: Independent with assistive device(s) Comments: wife states she helps him some with bathing and dressing.  Pts wife states that pt did very well with HHPT back in November, however had difficulty maintaining active lifestyle and has become more reliant on her.  She now has some health issues of her own and is unable to assist as she did before.   Functional Status:  Mobility: Bed Mobility Overal bed mobility: Modified Independent General bed mobility comments: with HOB flat and without rails to better simulate home environment Transfers Overall transfer level: Needs assistance Equipment used: Rolling walker (2 wheeled) Transfers: Sit to/from Stand Sit to  Stand: Min assist General transfer comment: Min A for facilitation of foward weight shift with cues for hand placement on bed instead of pulling up on RW.  Ambulation/Gait Ambulation/Gait assistance: Min assist, Mod assist (single instance of LOB requiring mod A ) Ambulation Distance (Feet): 20 Feet (x 4 reps (to wall across from room twice with standing rest break)) Assistive device: Rolling walker (2 wheeled) General Gait Details: Pt requires mod verbal cues for posture throughout gait along with safe when negotiating with RW, esp through tight spaces.  Single instance of LOB posteriorly when backing up to recliner, requiring mod A to correct.  Gait Pattern/deviations: Decreased stride length, Trunk flexed, Narrow base of support, Shuffle Gait velocity: decreased    ADL:    Cognition: Cognition Overall Cognitive Status: Within Functional Limits for tasks assessed Orientation Level: Oriented X4 Cognition Arousal/Alertness: Awake/alert Behavior During Therapy: WFL for tasks assessed/performed Overall Cognitive Status: Within Functional Limits for tasks assessed   Blood pressure 138/48, pulse 62, temperature 98.5 F (36.9 C), temperature source Oral, resp. rate 18, height 6' (1.829 m), weight 70.5 kg (155 lb 6.8 oz), SpO2 99 %. Physical Exam Constitutional: He is oriented to person, place, and time. He appears well-developed and well-nourished.  HENT:   Head: Normocephalic and atraumatic.  Right Ear: External ear normal.  Left Ear: External ear normal.  Eyes: Conjunctivae and EOM are normal. Pupils are equal, round, and reactive to light.  Neck: Normal range of motion.  Cardiovascular: Normal rate and normal heart sounds.   Respiratory: No respiratory distress.  GI: He exhibits no distension.  Musculoskeletal: He exhibits edema.  Right thigh vac in place and Leg with swelling. Thigh tender to ROM/palpation.  LUE with 1-2 +edema.  Neurological: He is alert and oriented to person,  place, and time. He has normal reflexes. No cranial nerve deficit.  UE: 5/5 prox to distal. RLE: 2+hf, 3 ke, 4 adf/apf, LLE: 3 hf, 3+ke, 4 adf/apf.  No sensory deficits.   Skin: Skin is dry.  Psychiatric: He has a normal mood and affect. His behavior is normal.   Results for orders placed or performed during the hospital encounter of 11/16/14 (from the past 48 hour(s))  Glucose, capillary     Status: Abnormal   Collection Time: 11/30/14  4:08 PM  Result Value Ref Range   Glucose-Capillary 103 (H) 65 - 99  mg/dL  Glucose, capillary     Status: Abnormal   Collection Time: 11/30/14 10:50 PM  Result Value Ref Range   Glucose-Capillary 106 (H) 65 - 99 mg/dL   Comment 1 Notify RN    Comment 2 Document in Chart   Heparin level (unfractionated)     Status: None   Collection Time: 12/01/14  3:40 AM  Result Value Ref Range   Heparin Unfractionated 0.41 0.30 - 0.70 IU/mL    Comment:        IF HEPARIN RESULTS ARE BELOW EXPECTED VALUES, AND PATIENT DOSAGE HAS BEEN CONFIRMED, SUGGEST FOLLOW UP TESTING OF ANTITHROMBIN III LEVELS.   CBC     Status: Abnormal   Collection Time: 12/01/14  3:40 AM  Result Value Ref Range   WBC 3.9 (L) 4.0 - 10.5 K/uL   RBC 2.69 (L) 4.22 - 5.81 MIL/uL   Hemoglobin 7.9 (L) 13.0 - 17.0 g/dL   HCT 25.1 (L) 39.0 - 52.0 %   MCV 93.3 78.0 - 100.0 fL   MCH 29.4 26.0 - 34.0 pg   MCHC 31.5 30.0 - 36.0 g/dL   RDW 19.0 (H) 11.5 - 15.5 %   Platelets 54 (L) 150 - 400 K/uL    Comment: REPEATED TO VERIFY PLATELET COUNT CONFIRMED BY SMEAR   Protime-INR     Status: Abnormal   Collection Time: 12/01/14  3:40 AM  Result Value Ref Range   Prothrombin Time 16.5 (H) 11.6 - 15.2 seconds   INR 1.32 0.00 - 1.49  Glucose, capillary     Status: None   Collection Time: 12/01/14  6:21 AM  Result Value Ref Range   Glucose-Capillary 94 65 - 99 mg/dL  Glucose, capillary     Status: Abnormal   Collection Time: 12/01/14 11:15 AM  Result Value Ref Range   Glucose-Capillary 104 (H) 65 -  99 mg/dL   Comment 1 Notify RN   Glucose, capillary     Status: Abnormal   Collection Time: 12/01/14  4:35 PM  Result Value Ref Range   Glucose-Capillary 121 (H) 65 - 99 mg/dL   Comment 1 Notify RN   Glucose, capillary     Status: Abnormal   Collection Time: 12/01/14  8:54 PM  Result Value Ref Range   Glucose-Capillary 133 (H) 65 - 99 mg/dL  CBC     Status: Abnormal   Collection Time: 12/02/14  3:12 AM  Result Value Ref Range   WBC 4.2 4.0 - 10.5 K/uL   RBC 2.55 (L) 4.22 - 5.81 MIL/uL   Hemoglobin 7.4 (L) 13.0 - 17.0 g/dL   HCT 23.8 (L) 39.0 - 52.0 %   MCV 93.3 78.0 - 100.0 fL   MCH 29.0 26.0 - 34.0 pg   MCHC 31.1 30.0 - 36.0 g/dL   RDW 19.1 (H) 11.5 - 15.5 %   Platelets 54 (L) 150 - 400 K/uL    Comment: REPEATED TO VERIFY PLATELET COUNT CONFIRMED BY SMEAR   Protime-INR     Status: Abnormal   Collection Time: 12/02/14  3:12 AM  Result Value Ref Range   Prothrombin Time 19.3 (H) 11.6 - 15.2 seconds   INR 1.62 (H) 0.00 - 1.49  Renal function panel     Status: Abnormal   Collection Time: 12/02/14  3:12 AM  Result Value Ref Range   Sodium 136 135 - 145 mmol/L   Potassium 3.3 (L) 3.5 - 5.1 mmol/L   Chloride 101 101 - 111 mmol/L   CO2 28  22 - 32 mmol/L   Glucose, Bld 100 (H) 65 - 99 mg/dL   BUN 23 (H) 6 - 20 mg/dL   Creatinine, Ser 6.67 (H) 0.61 - 1.24 mg/dL   Calcium 9.2 8.9 - 10.3 mg/dL   Phosphorus 2.7 2.5 - 4.6 mg/dL   Albumin 2.4 (L) 3.5 - 5.0 g/dL   GFR calc non Af Amer 8 (L) >60 mL/min   GFR calc Af Amer 9 (L) >60 mL/min    Comment: (NOTE) The eGFR has been calculated using the CKD EPI equation. This calculation has not been validated in all clinical situations. eGFR's persistently <60 mL/min signify possible Chronic Kidney Disease.    Anion gap 7 5 - 15  Heparin level (unfractionated)     Status: None   Collection Time: 12/02/14  3:12 AM  Result Value Ref Range   Heparin Unfractionated 0.39 0.30 - 0.70 IU/mL    Comment:        IF HEPARIN RESULTS ARE  BELOW EXPECTED VALUES, AND PATIENT DOSAGE HAS BEEN CONFIRMED, SUGGEST FOLLOW UP TESTING OF ANTITHROMBIN III LEVELS.   Glucose, capillary     Status: None   Collection Time: 12/02/14  6:51 AM  Result Value Ref Range   Glucose-Capillary 91 65 - 99 mg/dL  Prepare RBC     Status: None   Collection Time: 12/02/14  9:00 AM  Result Value Ref Range   Order Confirmation ORDER PROCESSED BY BLOOD BANK   Type and screen     Status: None (Preliminary result)   Collection Time: 12/02/14  9:00 AM  Result Value Ref Range   ABO/RH(D) O POS    Antibody Screen NEG    Sample Expiration 12/05/2014    Unit Number [id number]    Blood Component Type RBC LR PHER1    Unit division 00    Status of Unit ALLOCATED    Transfusion Status OK TO TRANSFUSE    Crossmatch Result Compatible   Glucose, capillary     Status: Abnormal   Collection Time: 12/02/14 11:12 AM  Result Value Ref Range   Glucose-Capillary 114 (H) 65 - 99 mg/dL   No results found.     Medical Problem List and Plan: 1. Functional deficits secondary to  R thigh hematoma, PAD with subsequent weakness/ gait instability 2.  DVT Prophylaxis/Anticoagulation: Pharmaceutical: Coumadin and Heparin   -daily coags, follow for bleeding complications 3. Pain Management:  Have ordered Oxycodone prn  -may need further adjustment with increased activity on rehab 4. Mood: LCSW to follow for evaluation and support. 5. Neuropsych: This patient is capable of making decisions on his own behalf. 6. Skin/Wound Care: Continue VAC right thigh at all times  -change MWF 7. Fluids/Electrolytes/Nutrition: Monitor I/O. Have ordered follow up labs for monday 8. DM type2 : Monitor BS ac/hs 9. Cardiac transplant: On imuran and sirolimus.  10. ESRD: HD on MWF after therapy sessions to help with tolerance of therapy.  11. Constipation: bowel regimen. Observe for patttern    Post Admission Physician Evaluation: 1. Functional deficits secondary  to R thigh  hematoma, PAD with subsequent weakness/ gait instability 2. Patient is admitted to receive collaborative, interdisciplinary care between the physiatrist, rehab nursing staff, and therapy team. 3. Patient's level of medical complexity and substantial therapy needs in context of that medical necessity cannot be provided at a lesser intensity of care such as a SNF. 4. Patient has experienced substantial functional loss from his/her baseline which was documented above under the "Functional History" and "  Functional Status" headings.  Judging by the patient's diagnosis, physical exam, and functional history, the patient has potential for functional progress which will result in measurable gains while on inpatient rehab.  These gains will be of substantial and practical use upon discharge  in facilitating mobility and self-care at the household level. 5. Physiatrist will provide 24 hour management of medical needs as well as oversight of the therapy plan/treatment and provide guidance as appropriate regarding the interaction of the two. 6. 24 hour rehab nursing will assist with bladder management, bowel management, safety, skin/wound care, disease management, medication administration, pain management and patient education  and help integrate therapy concepts, techniques,education, etc. 7. PT will assess and treat for/with: Lower extremity strength, range of motion, stamina, balance, functional mobility, safety, adaptive techniques and equipment, pain mgt, vac manipulation.   Goals are: mod I. 8. OT will assess and treat for/with: ADL's, functional mobility, safety, upper extremity strength, adaptive techniques and equipment, vac manipulation, pain mgt, .   Goals are: mod I. Therapy may not proceed with showering this patient. 9. SLP will assess and treat for/with: n/a.  Goals are: n/a. 10. Case Management and Social Worker will assess and treat for psychological issues and discharge planning. 11. Team conference  will be held weekly to assess progress toward goals and to determine barriers to discharge. 12. Patient will receive at least 3 hours of therapy per day at least 5 days per week. 13. ELOS: 7-10 days       14. Prognosis:  excellent     Meredith Staggers, MD, [place] [facility] 12/02/2014   12/02/2014

## 2014-12-02 NOTE — Progress Notes (Signed)
Physical Therapy Treatment Patient Details Name: VALENTE FOSBERG MRN: [id number] DOB: 1946/03/07 Today's Date: 12/02/2014    History of Present Illness 69 year old man with significant past medical history for end-stage renal disease on hemodialysis Monday Wednesday Friday, status post DVT on Coumadin, hypertension, diabetes who presented with severe weakness and found to have a hemoglobin 6.5. Had recent Rt thigh dialysis graft placed and found to have Large Rt groin hematoma. 6/27 evacuation of hematoma with VAC placed    PT Comments    Pt continues to push himself although he fatigues quickly. Completed multiple short walks with rests in between and pt agreed he is more safe with RW currently. Excellent rehab candidate.  Follow Up Recommendations  CIR;Supervision/Assistance - 24 hour     Equipment Recommendations  Rolling walker with 5" wheels    Recommendations for Other Services       Precautions / Restrictions Precautions Precautions: Fall    Mobility  Bed Mobility Overal bed mobility: Modified Independent             General bed mobility comments: incr time and effort  Transfers Overall transfer level: Needs assistance Equipment used: Rolling walker (2 wheeled);Straight cane Transfers: Sit to/from Stand Sit to Stand: Min assist         General transfer comment: steady assist for transitioning hands to RW; steady assist with cane as pt began reaching to hold IV pole in opposite hand  Ambulation/Gait Ambulation/Gait assistance: Min assist Ambulation Distance (Feet): 7 Feet (stand rest, 7 ft, seated rest, 15 ft, sit, 29ft) Assistive device: Rolling walker (2 wheeled);Straight cane Gait Pattern/deviations: Step-through pattern;Decreased stride length;Trunk flexed;Wide base of support Gait velocity: decreased   General Gait Details: walked to sink with SPC to brush teeth; stood propped on elbows on counter to do this x 4 minutes; fatigued and returned to sit  EOB; attempted again with [facility] and pt too weak/unsteady to go more than 15 feet; last walk used RW with education for safe use   Stairs            Wheelchair Mobility    Modified Rankin (Stroke Patients Only)       Balance Overall balance assessment: Needs assistance (denies falls)         Standing balance support: Bilateral upper extremity supported Standing balance-Leahy Scale: Poor Standing balance comment: unable to perform Berg tasks without UE support             High level balance activites: Other (comment) High Level Balance Comments: bil UE support and minguard-standing marching x 4, tandem stance x 10 sec, Romberg x 2, turn and look over each shoulder, release and raise one arm overhead x 2    Cognition Arousal/Alertness: Awake/alert Behavior During Therapy: WFL for tasks assessed/performed Overall Cognitive Status: Within Functional Limits for tasks assessed (although ? slow processing vs choosing words carefully)                      Exercises      General Comments General comments (skin integrity, edema, etc.): asking good questions re: post-acute options and anticipated course of recovery      Pertinent Vitals/Pain Pain Assessment: No/denies pain    Home Living                      Prior Function            PT Goals (current goals can now be found in the care plan  section) Acute Rehab PT Goals Patient Stated Goal: to return home Time For Goal Achievement: 12/15/14 Progress towards PT goals: Progressing toward goals    Frequency  Min 3X/week    PT Plan Current plan remains appropriate    Co-evaluation             End of Session Equipment Utilized During Treatment: Gait belt Activity Tolerance: Patient limited by fatigue Patient left: in chair;with call bell/phone within reach     Time: 0933-1013 PT Time Calculation (min) (ACUTE ONLY): 40 min  Charges:  $Gait Training: 23-37 mins $Therapeutic Activity:  8-22 mins                    G Codes:      Tonisha Silvey 12-16-2014, 1:20 PM Pager [phone]

## 2014-12-02 NOTE — Progress Notes (Signed)
[facility] Rounding Note Subjective:    No complaints, INR up to 1.63  Objective Filed Vitals:   12/01/14 0624 12/01/14 1325 12/01/14 2101 12/02/14 0652  BP: 113/50 113/55 130/58 138/48  Pulse: 87 88 94 62  Temp: 98.4 F (36.9 C) 98.5 F (36.9 C) 98.7 F (37.1 C) 98.5 F (36.9 C)  TempSrc:  Oral Oral Oral  Resp: 18 20 18 18   Height:      Weight: 69.2 kg (152 lb 8.9 oz)   70.5 kg (155 lb 6.8 oz)  SpO2: 100%  99% 99%   Physical Exam General: alert and oriented, no acute distress.  Heart: Regular S1S2 No S3 Lungs: Clear, unlabored  Abdomen: soft, nontender +BS Extremities: no LE edema. Bilat arm chronic edema  Dialysis Access: L IJ. R thigh AVG with wound vac  Labs: Basic Metabolic Panel:  Recent Labs Lab 11/29/14 0730 11/30/14 1000 12/02/14 0312  NA 136 135 136  K 4.0 3.9 3.3*  CL 100* 102 101  CO2 26 24 28   GLUCOSE 96 118* 100*  BUN 25* 33* 23*  CREATININE 6.38* 8.18* 6.67*  CALCIUM 9.5 9.1 9.2  PHOS 3.7 4.0 2.7     Recent Labs Lab 11/29/14 0730 11/30/14 1000 12/02/14 0312  ALBUMIN 2.6* 2.4* 2.4*   CBC:  Recent Labs Lab 11/29/14 0730 11/30/14 0355 11/30/14 1000 12/01/14 0340 12/02/14 0312  WBC 4.7 3.9* 4.0 3.9* 4.2  HGB 8.5* 8.1* 7.9* 7.9* 7.4*  HCT 27.1* 26.0* 25.2* 25.1* 23.8*  MCV 93.8 93.2 93.0 93.3 93.3  PLT 72* 59* 64* 54* 54*    Blood Culture    Component Value Date/Time   SDES WOUND GROIN RIGHT 11/21/2014 1007   SDES WOUND GROIN RIGHT 11/21/2014 1007   SPECREQUEST RIGHT GROIN HEMATOMA PT ON VANCOMYCIN 11/21/2014 1007   SPECREQUEST RIGHT GROIN HEMATOMA PT ON VANCOMYCIN 11/21/2014 1007   CULT  11/21/2014 1007    NO ANAEROBES ISOLATED Performed at [place]  11/21/2014 1007    NO GROWTH 3 DAYS Performed at [place] 11/26/2014 FINAL 11/21/2014 1007   REPTSTATUS 11/24/2014 FINAL 11/21/2014 1007      Recent Labs Lab 12/01/14 0621 12/01/14 1115 12/01/14 1635  12/01/14 2054 12/02/14 0651  GLUCAP 94 104* 121* 133* 91    Medications: . sodium chloride 10 mL/hr (11/26/14 0006)  . heparin 1,150 Units/hr (12/02/14 0855)   . sodium chloride   Intravenous Once  . sodium chloride   Intravenous Once  . atorvastatin  40 mg Oral Daily  . azaTHIOprine  75 mg Oral q morning - 10a  . bisacodyl  10 mg Oral Daily  . calcium acetate  667 mg Oral BID WC  . carvedilol  6.25 mg Oral QHS  . colesevelam  3,750 mg Oral Daily  . darbepoetin (ARANESP) injection - DIALYSIS  200 mcg Intravenous Q Wed-HD  . docusate sodium  100 mg Oral Daily  . doxercalciferol  4 mcg Intravenous Q M,W,F-HD  . famotidine  10 mg Oral QHS  . feeding supplement (PRO-STAT SUGAR FREE 64)  30 mL Oral BID  . feeding supplement (RESOURCE BREEZE)  1 Container Oral BID BM  . insulin aspart  0-5 Units Subcutaneous QHS  . insulin aspart  0-9 Units Subcutaneous TID WC  . isosorbide dinitrate  40 mg Oral 3 times per day  . multivitamin  1 tablet Oral QHS  . pantoprazole  40 mg Oral Daily  .  polyethylene glycol  17 g Oral BID  . predniSONE  5 mg Oral Q breakfast  . Sirolimus  1.5 mg Oral Daily  . warfarin  5 mg Oral ONCE-1800  . Warfarin - Pharmacist Dosing Inpatient   Does not apply q1800    Dialysis Orders:  [facility]  MWF 4 hours EDW 72 - needs lower EDW at d/c - estimate 70 kg based on pre/post wts 2K 2 Ca  4 hr  3000 heparin no heparin at discharge left IJ and right fem AVGG inserted 5/26  Hectorol 4  Aranesp 200 q Wed no Fe  Assessment/Plan: 1. 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 Wound culture of groin no growth. VVS to followup. 2. RLE arterial insufficiency - s/p angio / per Dr.Chen- not likely to be responsive to endovascular intervention- Plan for now is observation. Pt to transition back to warfarin.  3. Anemia - multiple etiologies- due to AVG bleed / CKD Anemia/ and immunosuppression  meds- s/p transfusions on 6/22 (2U) and 7/3 (1U). On max Aranesp/getting Fe load. Hb drifting. Transfuse one unit of blood with HD today. With a heart transplant and CAD does not tolerate Hb in the 7's well. 4. ESRD - MWF HD - no heparin with HD since on anticoagulation. New EDW of 70 at discharge 5. Metabolic bone disease - Hectorol 4. Phos 4. Reduced ca acetate BID with meals this admission. 6. Volume/ HTN: BP stable with HD and low dose coreg 7. 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 Transitioning back to coumadin since no invasive procedure planned  8. 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 Dm type 2- per primary service 10. Malnutrition - alb mid 2's - renal diet/vits/supplements 11. Thrombocytopenia - increases bleeding risk 12. Disposition - anticipate home when coumadin therapeutic?  HD tomorrow.  Kelly Splinter MD (pgr) [phone]    ([phone] 12/02/2014, 9:33 AM

## 2014-12-02 NOTE — Discharge Summary (Signed)
Brandon Sharp, is a 69 y.o. male  DOB Sep 01, 1945  MRN [id number].  Admission date:  11/16/2014  Admitting Physician  Erline Hau, MD  Discharge Date:  12/02/2014   Primary MD  [name], MD  Recommendations for primary care physician for things to follow:  - Please inform nephrology patient has been admitted to CIR, to continue with hemodialysis. - Please consult pharmacy to continue dosing heparin drip until INR is therapeutic equal or more than 2.   Admission Diagnosis  ESRD (end stage renal disease) [N18.6] Symptomatic anemia [D64.9]   Discharge Diagnosis  ESRD (end stage renal disease) [N18.6] Symptomatic anemia [D64.9]    Principal Problem:   Symptomatic anemia Active Problems:   DVT (deep venous thrombosis)   Coagulopathy   Heart transplanted   HTN (hypertension), malignant   DM (diabetes mellitus), type 2 with renal complications   ESRD (end stage renal disease) on dialysis      Past Medical History  Diagnosis Date  . Diabetes mellitus   . Hypertension   . Myocardial infarction 1985; 1990  . Angina   . Dysrhythmia   . DVT (deep venous thrombosis) ~ 12/2010    LLE  . Anemia   . Blood transfusion 06/1999    post heart transplant  . Peripheral vascular disease   . Renal failure     Hemodialysis MWF last 2 years, sse dr [name] nephrology, goes to [facility]  . DVT (deep venous thrombosis) 08/25/2014    Nearly occluded R IJ, and L subclavian DVT  . CHF (congestive heart failure) 2001    beffore transplant; had AICD that was explanted 07/19/99 following successful heart transplant  . Coronary artery disease     transplant cardiologist Dr. Corky Mull [facility])  . Pneumonia     Past Surgical History  Procedure Laterality Date  . Nephrectomy transplanted organ  2008  . Av fistula repair      rt. a=fore arm fistula  . Av fistula  placement  ~ 01/2010    "this was my 2nd fistula placement"  . Heart transplant  06/1999  . Colonoscopy  03/17/2012    Procedure: COLONOSCOPY;  Surgeon: Lear Ng, MD;  Location: [facility];  Service: Endoscopy;  Laterality: N/A;  . Coronary artery bypass graft  1990    CABG X3  . Colonoscopy with propofol N/A 12/14/2013    Procedure: COLONOSCOPY WITH PROPOFOL;  Surgeon: Lear Ng, MD;  Location: [facility];  Service: Endoscopy;  Laterality: N/A;  . Video bronchoscopy Bilateral 04/14/2014    Procedure: VIDEO BRONCHOSCOPY WITH FLUORO;  Surgeon: Collene Gobble, MD;  Location: [place];  Service: Cardiopulmonary;  Laterality: Bilateral;  . Shuntogram Right 05/09/2014    Procedure: FISTULOGRAM;  Surgeon: Conrad Wilson, MD;  Location: [facility] CATH LAB;  Service: Cardiovascular;  Laterality: Right;  . Coronary angioplasty with stent placement  1985; 1990    prior stents in the 80's and 90's then CABG; s/p heart transplant 2001 s/p BMS  mid LAD and proximal RCA 04/21/13 [facility])  . Av fistula placement Right 10/20/2014    Procedure: INSERTION OF ARTERIOVENOUS (AV) GORE-TEX GRAFT THIGH;  Surgeon: Conrad Manzanita, MD;  Location: [place];  Service: Vascular;  Laterality: Right;  . Hematoma evacuation Right 11/21/2014    Procedure: EVACUATION HEMATOMA Right Groin;  Surgeon: Conrad St. Joseph, MD;  Location: [place];  Service: Vascular;  Laterality: Right;  . Application of wound vac Right 11/21/2014    Procedure: APPLICATION OF WOUND VAC Right Groin;  Surgeon: Conrad Marion, MD;  Location: [place];  Service: Vascular;  Laterality: Right;  . Peripheral vascular catheterization N/A 11/25/2014    Procedure: Abdominal Aortogram;  Surgeon: Elam Dutch, MD;  Location: [place] CV LAB;  Service: Cardiovascular;  Laterality: N/A;  . Lower extremity angiogram Right 11/25/2014    Procedure: Lower Extremity Angiogram;  Surgeon: Elam Dutch, MD;  Location: [place] CV LAB;  Service: Cardiovascular;   Laterality: Right;       History of present illness and  Hospital Course:     Kindly see H&P for history of present illness and admission details, please review complete Labs, Consult reports and Test reports for all details in brief  HPI  from the history and physical done on the day of admission Patient is a very pleasant 69 year old man with significant past medical history for end-stage renal disease on hemodialysis Monday Wednesday Friday, status post DVT on Coumadin, hypertension, diabetes who presents today with severe weakness and found to have a hemoglobin 6.5. On 6/8 he was found to have a hemoglobin of 7.4 and was given 1 unit of PRBCs. His recent history is significant for a right thigh dialysis graft done in March of this year by Dr. Bridgett Larsson.He has already been seen by nephrology and 2 units of PRBCs have been ordered. Right groin ultrasound is done in the ED and shows a large hypoechoic structure consistent with a hematoma. We have been asked to admit him for further evaluation and management.  Hospital Course  69 year old man with significant past medical history for end-stage renal disease on hemodialysis Monday Wednesday Friday, status post DVT on Coumadin, hypertension, diabetes who presented with severe weakness and found to have a hemoglobin 6.5.  On 6/8 he was found to have a hemoglobin of 7.4 and was given 1 unit of PRBCs. His recent history is significant for a right thigh dialysis graft done in March of this year by Dr. Bridgett Larsson. Right groin ultrasound is done in the ED and shows a large hypoechoic structure consistent with a hematoma. S/p vasc surgery R groin hematoma evacuation, VAC placement, s/p R thigh AVG, R leg runoff 6/27, patient was resumed on warfarin 7/6 as no further plan for surgical intervention by vascular surgery   Symptomatic anemia due to Dehisced R thigh AVG wound, right groin hematoma - due to right groin hematoma due to dehiscence of his AVG status post  evacuation and wound VAC, R thigh AVG, R leg runoff 6/27 by Dr. Bridgett Larsson -Was on IV iron and Epogen therapy as per renal service - Hemoglobin is 7.4 today, will transfuse 1 unit PRBC during dialysis. - Wound cultures has been negative, off vancomycin.   Peripheral arterial disease, right lower extremity arterial insufficiency  -  Seen by vascular surgery ,patient underwent right leg angiogram Angiogram showed: chronic right superficial femoral artery occlusion with reconstitution of the below-knee popliteal artery. - Vein mapping done - Vascular surgery discussed option with the  patient, which includes observation versus R fem-pop bypass with PTFE vs ligation of right thigh AVG, patient elected observation and this point. - Giving no surgical intervention anticipated, back on warfarin, will be bridged with heparin gtt. - wound VAC prescription has been signed and home health can arrange wound VAC at home  hypoglycemia with type 2 diabetes Blood sugars currently controlled, continue sliding scale insulin, CBGs has been acceptable during hospital stay off Lantus, we'll hold Lantus on discharge Hemoglobin A1c 5.6  Thrombocytopenia - Chronic, been in the mid 50s in the past, patient is on sirolimus and azathioprine, which is contributing to it. Continue to monitor closely, no indication for transfusion.  DVT, bilateral upper extremity  -Has been on Coumadin since March 2016. -Continue heparin drip, coumadin has been resumed, his consult pharmacy to dose heparin GTT till INR equal or more than 2.  Heart transplant -Continue immunosuppressive medications. Prednisone, sirolimus,  End-stage renal disease on hemodialysis -Appreciate nephrology following. Hemodialysis MWF.  Hypertension -Stable and well control, will continue home antihypertensive regimen -Patient on HD M-W-F which is also helping volume control  Mild-to-moderate malnutrition Continue nutritional supplements   Discharge  Condition: stable   Follow UP  Follow-up Information    Follow up with [facility].   Why:  Registered Nurse (wound vac)   Contact information:   [address] [place] [address] [phone]       Follow up with Adele Barthel, MD In 2 weeks.   Specialties:  Vascular Surgery, Cardiology   Why:  Our office will call you to arrange an appointment (sent)   Contact information:   [place] [address] [phone]         Discharge Instructions  and  Discharge Medications    Discharge Instructions    Diet - low sodium heart healthy    Complete by:  As directed      Discharge instructions    Complete by:  As directed   Follow with Primary MD [name], MD after discharge from CIR    Activity: As tolerated with Full fall precautions use walker/cane & assistance as needed   Disposition CIR   Diet: Renal/carbohydrate modified diet with 1200 mL fluid restriction , with feeding assistance and aspiration precautions.  For Heart failure patients - Check your Weight same time everyday, if you gain over 2 pounds, or you develop in leg swelling, experience more shortness of breath or chest pain, call your Primary MD immediately. Follow Cardiac Low Salt Diet and 1.5 lit/day fluid restriction.   On your next visit with your primary care physician please Get Medicines reviewed and adjusted.   Please request your Prim.MD to go over all Hospital Tests and Procedure/Radiological results at the follow up, please get all Hospital records sent to your Prim MD by signing hospital release before you go home.   If you experience worsening of your admission symptoms, develop shortness of breath, life threatening emergency, suicidal or homicidal thoughts you must seek medical attention immediately by calling 911 or calling your MD immediately  if symptoms less severe.  You Must read complete instructions/literature along with all the possible  adverse reactions/side effects for all the Medicines you take and that have been prescribed to you. Take any new Medicines after you have completely understood and accpet all the possible adverse reactions/side effects.   Do not drive, operating heavy machinery, perform activities at heights, swimming or participation in water activities or provide baby sitting  services if your were admitted for syncope or siezures until you have seen by Primary MD or a Neurologist and advised to do so again.  Do not drive when taking Pain medications.    Do not take more than prescribed Pain, Sleep and Anxiety Medications  Special Instructions: If you have smoked or chewed Tobacco  in the last 2 yrs please stop smoking, stop any regular Alcohol  and or any Recreational drug use.  Wear Seat belts while driving.   Please note  You were cared for by a hospitalist during your hospital stay. If you have any questions about your discharge medications or the care you received while you were in the hospital after you are discharged, you can call the unit and asked to speak with the hospitalist on call if the hospitalist that took care of you is not available. Once you are discharged, your primary care physician will handle any further medical issues. Please note that NO REFILLS for any discharge medications will be authorized once you are discharged, as it is imperative that you return to your primary care physician (or establish a relationship with a primary care physician if you do not have one) for your aftercare needs so that they can reassess your need for medications and monitor your lab values.     Discharge wound care:    Complete by:  As directed   Continue with wound VAC     Increase activity slowly    Complete by:  As directed             Medication List    STOP taking these medications        famotidine 10 MG chewable tablet  Commonly known as:  PEPCID AC      TAKE these medications         atorvastatin 40 MG tablet  Commonly known as:  LIPITOR  Take 40 mg by mouth daily.     azaTHIOprine 50 MG tablet  Commonly known as:  IMURAN  Take 75 mg by mouth every morning.     b complex-vitamin c-folic acid 0.8 MG Tabs tablet  Take 1 tablet by mouth at bedtime.     calcium acetate 667 MG capsule  Commonly known as:  PHOSLO  Take 667 mg by mouth 3 (three) times daily with meals.     carvedilol 6.25 MG tablet  Commonly known as:  COREG  Take 1 tablet (6.25 mg total) by mouth at bedtime.     colesevelam 625 MG tablet  Commonly known as:  WELCHOL  Take 3,750 mg by mouth daily. Takes 6 tabs daily     heparin 100-0.45 UNIT/ML-% infusion  Please consult: Inpatient pharmacy to continue with heparin GTT, tilt target INR is reached , which is 2     HUMALOG KWIKPEN 100 UNIT/ML KiwkPen  Generic drug:  insulin lispro  Inject 4-10 Units into the skin 2 (two) times daily. Per sliding scale     insulin aspart 100 UNIT/ML injection  Commonly known as:  novoLOG  Inject 0-5 Units into the skin at bedtime.     insulin glargine 100 UNIT/ML injection  Commonly known as:  LANTUS  Inject 20 Units into the skin every morning.     isosorbide dinitrate 40 MG CR tablet  Commonly known as:  ISOCHRON  Take 40 mg by mouth every 8 (eight) hours.     oxyCODONE 5 MG immediate release tablet  Commonly known as:  Oxy IR/ROXICODONE  Take  1 tablet (5 mg total) by mouth every 4 (four) hours as needed for moderate pain.     pantoprazole 40 MG tablet  Commonly known as:  PROTONIX  Take 1 tablet (40 mg total) by mouth daily.     predniSONE 5 MG tablet  Commonly known as:  DELTASONE  Take 1 tablet (5 mg total) by mouth daily with breakfast.     Sirolimus 0.5 MG Tabs  Take 3 tablets by mouth daily.     warfarin 5 MG tablet  Commonly known as:  COUMADIN  Please consult: Inpatient pharmacy to continue dosing warfarin and monitor          Diet and Activity recommendation: See Discharge  Instructions above   Consults obtained -  Vascular surgery   nephrology    Major procedures and Radiology Reports - PLEASE review detailed and final reports for all details, in brief -  Right groin hematoma evacuation Angiogram Hemodialysis    Dg Chest 2 View  11/11/2014   CLINICAL DATA:  Short of breath, low hemoglobin blood transfusion  EXAM: CHEST  2 VIEW  COMPARISON:  08/25/2014  FINDINGS: Vascular stents noted within the subclavian veins. Large-bore left central venous line with tip in the distal SVC. Normal cardiac silhouette. No pulmonary edema. Mild left basilar atelectasis. No pneumothorax.  IMPRESSION: 1. Mild left basilar atelectasis. 2. No edema or infiltrate.   Electronically Signed   By: Suzy Bouchard M.D.   On: 11/11/2014 19:29    Micro Results   Results for orders placed or performed during the hospital encounter of 11/16/14  Surgical pcr screen     Status: Abnormal   Collection Time: 11/21/14  4:54 AM  Result Value Ref Range Status   MRSA, PCR POSITIVE (A) NEGATIVE Final   Staphylococcus aureus POSITIVE (A) NEGATIVE Final    Comment:        The Xpert SA Assay (FDA approved for NASAL specimens in patients over 60 years of age), is one component of a comprehensive surveillance program.  Test performance has been validated by [facility] for patients greater than or equal to 20 year old. It is not intended to diagnose infection nor to guide or monitor treatment.   Anaerobic culture     Status: None   Collection Time: 11/21/14 10:07 AM  Result Value Ref Range Status   Specimen Description WOUND GROIN RIGHT  Final   Special Requests RIGHT GROIN HEMATOMA PT ON VANCOMYCIN  Final   Gram Stain   Final    NO WBC SEEN NO SQUAMOUS EPITHELIAL CELLS SEEN NO ORGANISMS SEEN Performed at [facility]    Culture   Final    NO ANAEROBES ISOLATED Performed at [facility]    Report Status 11/26/2014 FINAL  Final  Wound culture     Status: None     Collection Time: 11/21/14 10:07 AM  Result Value Ref Range Status   Specimen Description WOUND GROIN RIGHT  Final   Special Requests RIGHT GROIN HEMATOMA PT ON VANCOMYCIN  Final   Gram Stain   Final    NO WBC SEEN NO SQUAMOUS EPITHELIAL CELLS SEEN NO ORGANISMS SEEN Performed at [facility]    Culture   Final    NO GROWTH 3 DAYS Performed at [facility]    Report Status 11/24/2014 FINAL  Final     No results found for this or any previous visit (from the past 240 hour(s)).     Today  Subjective:   Favio Moder today has no headache,no chest abdominal pain,no new weakness tingling or numbness, feels much better today.   Objective:   Blood pressure 138/48, pulse 62, temperature 98.5 F (36.9 C), temperature source Oral, resp. rate 18, height 6' (1.829 m), weight 70.5 kg (155 lb 6.8 oz), SpO2 99 %.   Intake/Output Summary (Last 24 hours) at 12/02/14 1240 Last data filed at 12/01/14 1800  Gross per 24 hour  Intake 609.57 ml  Output      0 ml  Net 609.57 ml    Exam  General: Alert and oriented x 3, NAD  HEENT: PERRLA, EOMI,   Neck: Supple  CVS: S1 S2clear, no mrg,  Respiratory: CTAB  Abdomen: Soft, NT, ND, NBS  Ext: no cyanosis clubbing, 1+ edema, wound VAC to the right groin, bilateral upper extremity edema  Neuro: no new deficits  Skin: No rashes  Psych: Normal affect and demeanor, alert and oriented x3  Data Review   CBC w Diff: Lab Results  Component Value Date   WBC 4.2 12/02/2014   WBC 4.5 02/13/2012   HGB 7.4* 12/02/2014   HGB 11.6* 02/13/2012   HCT 23.8* 12/02/2014   HCT 36.1* 02/13/2012   PLT 54* 12/02/2014   PLT 76* 02/13/2012   LYMPHOPCT 46 11/16/2014   LYMPHOPCT 17.0 02/13/2012   MONOPCT 9 11/16/2014   MONOPCT 12.9 02/13/2012   EOSPCT 2 11/16/2014   EOSPCT 2.1 02/13/2012   BASOPCT 0 11/16/2014   BASOPCT 0.4 02/13/2012    CMP: Lab Results  Component Value Date   NA 136 12/02/2014   K 3.3*  12/02/2014   CL 101 12/02/2014   CO2 28 12/02/2014   BUN 23* 12/02/2014   CREATININE 6.67* 12/02/2014   PROT 6.0* 11/11/2014   ALBUMIN 2.4* 12/02/2014   BILITOT 1.2 11/11/2014   ALKPHOS 51 11/11/2014   AST 31 11/11/2014   ALT 13* 11/11/2014  .   Total Time in preparing paper work, data evaluation and todays exam - 35 minutes  ELGERGAWY, DAWOOD M.D on 12/02/2014 at 12:40 PM  [facility]   Office  [phone]

## 2014-12-02 NOTE — Progress Notes (Signed)
PMR Admission Coordinator Pre-Admission Assessment  Patient: Brandon Sharp is an 69 y.o., male MRN: [id number] DOB: Dec 15, 1945 Height: 6' (182.9 cm) Weight: 70.5 kg (155 lb 6.8 oz)  Insurance Information HMO: PPO: PCP: IPA: 80/20: yes OTHER: No HMO PRIMARY: Medicare a and b Policy#: [id number] Subscriber: pt Benefits: Phone #: palmetto online Name: 12/02/14 Eff. Date: 01/26/2000 Deduct: $1288 Out of Pocket Max: none Life Max: none CIR: 100% SNF: 20 full days Outpatient: 80% Co-Pay: 20% Home Health: 100% Co-Pay: none DME: 80% Co-Pay: 20% Providers: pt choice  SECONDARY: AARP Policy#: [id number] Subscriber: pt  Medicaid Application Date: Case Manager:  Disability Application Date: Case Worker:   Emergency [place]    Name Relation Home Work Mobile   [place] Spouse [phone]  [phone]     Current Medical History  Patient Admitting Diagnosis: right thigh hematoma, PAD with subsequent weakness/gait instability  History of Present Illness: Brandon Sharp is a 69 y.o. male with history of DM type 2, CAD, CHF s/p Cardiac transplant, ESRD who was admitted on 11/16/14 from HD center due to profound weakness. He was found to be anemic due to right thigh hematoma s/p recent AVG placement. He was transfused with 2 units PRBC and was taken to OR on 11/21/14 for evacuation of Thigh hematoma with VAC placement. He developed right foot ischemia and pain therefore arteriogram recommended due to concerns of steal syndrome. He underwent aortogram with attempts at recanalization of R-SFA stent and evidence of medium length occlusion. Patient has elected on observation for now v/s R-FPBG or ligation of RLE AVG.     Past Medical History  Past Medical History  Diagnosis Date  . Diabetes mellitus   . Hypertension   . Myocardial infarction 1985; 1990  . Angina   . Dysrhythmia   . DVT (deep venous thrombosis) ~ 12/2010    LLE  . Anemia   . Blood transfusion 06/1999    post heart transplant  . Peripheral vascular disease   . Renal failure     Hemodialysis MWF last 2 years, sse dr [name] nephrology, goes to [facility]  . DVT (deep venous thrombosis) 08/25/2014    Nearly occluded R IJ, and L subclavian DVT  . CHF (congestive heart failure) 2001    beffore transplant; had AICD that was explanted 07/19/99 following successful heart transplant  . Coronary artery disease     transplant cardiologist Dr. Corky Mull [facility]  . Pneumonia     Family History  family history includes Diabetes in his brother and mother; Heart attack in his brother; Heart disease in his brother, father, and mother; Hyperlipidemia in his brother, father, and mother; Hypertension in his brother, father, and mother.  Prior Rehab/Hospitalizations:  Has the patient had major surgery during 100 days prior to admission? Yes  Current Medications   Current facility-administered medications:  . 0.9 % sodium chloride infusion, , Intravenous, Continuous, Conrad [place], MD, Last Rate: 10 mL/hr at 11/26/14 0006, 10 mL/hr at 11/26/14 0006 . 0.9 % sodium chloride infusion, , Intravenous, Once, Ripudeep K Rai, MD . 0.9 % sodium chloride infusion, , Intravenous, Once, Albertine Patricia, MD . acetaminophen (TYLENOL) tablet 325-650 mg, 325-650 mg, Oral, Q4H PRN, 650 mg at 11/27/14 2253 **OR** acetaminophen (TYLENOL) suppository 325-650 mg, 325-650 mg, Rectal, Q4H PRN, Elam Dutch, MD . atorvastatin (LIPITOR) tablet 40 mg, 40 mg, Oral, Daily, Erline Hau, MD, 40 mg at 12/02/14 1025 . azaTHIOprine (IMURAN) tablet 75  mg, 75 mg, Oral,  q morning - 10a, Estela Leonie Green, MD, 75 mg at 12/02/14 1024 . bisacodyl (DULCOLAX) EC tablet 10 mg, 10 mg, Oral, Daily, Albertine Patricia, MD, 10 mg at 12/02/14 1024 . calcium acetate (PHOSLO) capsule 667 mg, 667 mg, Oral, BID WC, Roney Jaffe, MD, 667 mg at 12/02/14 0847 . carvedilol (COREG) tablet 6.25 mg, 6.25 mg, Oral, QHS, Erline Hau, MD, 6.25 mg at 12/01/14 2219 . colesevelam [facility]) tablet 3,750 mg, 3,750 mg, Oral, Daily, Erline Hau, MD, 3,750 mg at 12/02/14 1025 . Darbepoetin Alfa (ARANESP) injection 200 mcg, 200 mcg, Intravenous, Q Wed-HD, Ernest Haber, PA-C, 200 mcg at 11/30/14 1233 . docusate sodium (COLACE) capsule 100 mg, 100 mg, Oral, Daily, Elam Dutch, MD, 100 mg at 12/02/14 1025 . doxercalciferol (HECTOROL) injection 4 mcg, 4 mcg, Intravenous, Q M,W,F-HD, Alric Seton, PA-C, 4 mcg at 11/30/14 1233 . famotidine (PEPCID) tablet 10 mg, 10 mg, Oral, QHS, Erline Hau, MD, 10 mg at 12/01/14 2221 . feeding supplement (PRO-STAT SUGAR FREE 64) liquid 30 mL, 30 mL, Oral, BID, Dale [place], RD, 30 mL at 11/24/14 1119 . feeding supplement (RESOURCE BREEZE) (RESOURCE BREEZE) liquid 1 Container, 1 Container, Oral, BID BM, Dale [place], RD, 1 Container at 11/28/14 [phone] . heparin ADULT infusion 100 units/mL (25000 units/250 mL), 1,150 Units/hr, Intravenous, Continuous, Reginia Naas, [facility], Last Rate: 11.5 mL/hr at 12/02/14 0855, 1,150 Units/hr at 12/02/14 0855 . hydrALAZINE (APRESOLINE) injection 5 mg, 5 mg, Intravenous, Q20 Min PRN, Elam Dutch, MD . insulin aspart (novoLOG) injection 0-5 Units, 0-5 Units, Subcutaneous, QHS, Estela Leonie Green, MD, 0 Units at 11/16/14 2200 . insulin aspart (novoLOG) injection 0-9 Units, 0-9 Units, Subcutaneous, TID WC, Estela Leonie Green, MD, 1 Units at 11/29/14 1209 . isosorbide dinitrate (DILATRATE-SR) capsule 40 mg, 40 mg, Oral, 3 times per day,  Erline Hau, MD, 40 mg at 12/02/14 9604 . labetalol (NORMODYNE,TRANDATE) injection 10 mg, 10 mg, Intravenous, Q10 min PRN, Elam Dutch, MD . metoprolol (LOPRESSOR) injection 2-5 mg, 2-5 mg, Intravenous, Q2H PRN, Elam Dutch, MD . multivitamin (RENA-VIT) tablet 1 tablet, 1 tablet, Oral, QHS, Erline Hau, MD, 1 tablet at 12/01/14 2219 . ondansetron (ZOFRAN) injection 4 mg, 4 mg, Intravenous, Q6H PRN, Elam Dutch, MD . ondansetron [facility]) tablet 4 mg, 4 mg, Oral, Q6H PRN **OR** [DISCONTINUED] ondansetron (ZOFRAN) injection 4 mg, 4 mg, Intravenous, Q6H PRN, Erline Hau, MD . oxyCODONE (Oxy IR/ROXICODONE) immediate release tablet 5 mg, 5 mg, Oral, Q4H PRN, Erline Hau, MD, 5 mg at 11/27/14 2143 . pantoprazole (PROTONIX) EC tablet 40 mg, 40 mg, Oral, Daily, Elam Dutch, MD, 40 mg at 12/02/14 1025 . polyethylene glycol (MIRALAX / GLYCOLAX) packet 17 g, 17 g, Oral, BID, Albertine Patricia, MD, 17 g at 12/02/14 1024 . predniSONE (DELTASONE) tablet 5 mg, 5 mg, Oral, Q breakfast, Erline Hau, MD, 5 mg at 12/02/14 0847 . senna-docusate (Senokot-S) tablet 1 tablet, 1 tablet, Oral, QHS PRN, Erline Hau, MD, 1 tablet at 12/01/14 0115 . Sirolimus TABS 1.5 mg, 1.5 mg, Oral, Daily, Ripudeep K Rai, MD, 1.5 mg at 12/02/14 1024 . sodium chloride 0.9 % injection 10-40 mL, 10-40 mL, Intracatheter, PRN, Corliss Parish, MD, 20 mL at 11/26/14 2343 . warfarin (COUMADIN) tablet 5 mg, 5 mg, Oral, ONCE-1800, Ricka Burdock, RPH . Warfarin - Pharmacist Dosing Inpatient, , Does  not apply, q1800, Reginia Naas, RPH, 0 at 11/30/14 1551  Patients Current Diet: Diet renal/carb modified with fluid restriction Diet-HS Snack?: Nothing; Room service appropriate?: Yes; Fluid consistency:: Thin  Precautions / Restrictions Precautions Precautions: Fall Precaution Comments: wound vac R  groin Restrictions Weight Bearing Restrictions: No   Has the patient had 2 or more falls or a fall with injury in the past year?No  Prior Activity Level Community (5-7x/wk): HD MWF, drives self.  Pt drove self to OP HD MWF [facility]. Used a cane. Over past month gradual decline in function. States he did his own adls. Had [facility] therapy twice this year, but pt and wife state it was not enough intensity.  Home Assistive Devices / Equipment Home Assistive Devices/Equipment: Eyeglasses Home Equipment: Cane - single point, Shower seat  Prior Device Use: Indicate devices/aids used by the patient prior to current illness, exacerbation or injury? cane  Prior Functional Level Prior Function Level of Independence: Independent with assistive device(s) Comments: wife states she helps him some with bathing and dressing. Pts wife states that pt did very well with HHPT back in November, however had difficulty maintaining active lifestyle and has become more reliant on her. She now has some health issues of her own and is unable to assist as she did before.   Self Care: Did the patient need help bathing, dressing, using the toilet or eating? Needed some help  Indoor Mobility: Did the patient need assistance with walking from room to room (with or without device)? Independent  Stairs: Did the patient need assistance with internal or external stairs (with or without device)? Independent  Functional Cognition: Did the patient need help planning regular tasks such as shopping or remembering to take medications? Independent  Current Functional Level Cognition  Overall Cognitive Status: Within Functional Limits for tasks assessed Orientation Level: Oriented X4   Extremity Assessment (includes Sensation/Coordination)     Lower Extremity Assessment: Generalized weakness    ADLs       Mobility  Overal bed mobility: Modified Independent General bed mobility comments: with  HOB flat and without rails to better simulate home environment    Transfers  Overall transfer level: Needs assistance Equipment used: Rolling walker (2 wheeled) Transfers: Sit to/from Stand Sit to Stand: Min assist General transfer comment: Min A for facilitation of foward weight shift with cues for hand placement on bed instead of pulling up on RW.     Ambulation / Gait / Stairs / Wheelchair Mobility  Ambulation/Gait Ambulation/Gait assistance: Min assist, Mod assist (single instance of LOB requiring mod A ) Ambulation Distance (Feet): 20 Feet (x 4 reps (to wall across from room twice with standing rest break)) Assistive device: Rolling walker (2 wheeled) General Gait Details: Pt requires mod verbal cues for posture throughout gait along with safe when negotiating with RW, esp through tight spaces. Single instance of LOB posteriorly when backing up to recliner, requiring mod A to correct.  Gait Pattern/deviations: Decreased stride length, Trunk flexed, Narrow base of support, Shuffle Gait velocity: decreased    Posture / Balance Balance Overall balance assessment: Needs assistance Sitting-balance support: Feet supported, No upper extremity supported Sitting balance-Leahy Scale: Good Standing balance support: Bilateral upper extremity supported, During functional activity Standing balance-Leahy Scale: Poor Standing balance comment: Pt requires min to mod A along with use of RW to maintain balance during functional mobility.     Special needs/care consideration Continuous Drip IV heparin IV as a transition to coumadin DialysisMWF Wound  Vac (area) R Thigh Location  Brandon Child, RN Registered Nurse Addendum WOC Consult Note 11/23/2014 10:12 AM    Expand All Collapse All  WOC wound consult note Pt is followed by VVS service for assessment and plan of care to right groin wound. Reason for Consult: Consult requested for right groin Vac dressing change. Dr Bridgett Larsson  at the bedside to assess the wound during the first post-op dressing change. Wound type: Full thickness post-op incision Measurement: 1X7X.8cm Wound bed: 100% beefy red Drainage (amount, consistency, odor) Small amt pink drainage in the cannister, no odor Periwound: Intact skin surrounding Dressing procedure/placement/frequency: Applied one piece of black foam to 194mm cont suction. Pt tolerated with minimal c/o pain. Plan for bedside nurses to change Q M/W/F. Please re-consult if further assistance is needed. Brandon Dry MSN, RN, [place], [place], [place]       Revision History     Date/Time User Provider Type Action   11/23/2014 10:17 AM Brandon Child, RN Registered Nurse Addend   11/23/2014 10:16 AM Brandon Sharp Alger Simons, RN Registered Nurse Sign   View Details Report              Skin HD catheter left chest and r thigh wound with VAC Bowel mgmt: no BM since admission per pt. Typically q 2-3 days Bladder mgmt: no urine DM yes   Previous Home Environment Living Arrangements: Spouse/significant other Lives With: Spouse Available Help at Discharge: Family, Available 24 hours/day Type of Home: House Home Layout: One level Home Access: Stairs to enter Entrance Stairs-Rails: None Entrance Stairs-Number of Steps: 2 Bathroom Shower/Tub: Multimedia programmer: Programmer, systems: Yes How Accessible: Accessible via walker Home Care Services: No Additional Comments: has a stool at his sink, did not ambulate much in the community   Discharge Living Setting Plans for Discharge Living Setting: Patient's home, Lives with (comment), Other (Comment) (spouse) Type of Home at Discharge: House Discharge Home Layout: One level Discharge Home Access: Stairs to enter Entrance Stairs-Rails: None Entrance Stairs-Number of Steps: 2 Discharge Bathroom Shower/Tub: Horticulturist, commercial:  Standard Discharge Bathroom Accessibility: Yes How Accessible: Accessible via walker Does the patient have any problems obtaining your medications?: No  Social/Family/Support Systems Patient Roles: Spouse, Parent, Other (Comment) (retired reporter) Contact Information: Abrian [place], wife Anticipated Caregiver: wife Anticipated Caregiver's Contact Information: see above Ability/Limitations of Caregiver: she has back issues but can provide supervision Caregiver Availability: 24/7 Discharge Plan Discussed with Primary Caregiver: Yes Is Caregiver In Agreement with Plan?: No Does Caregiver/Family have Issues with Lodging/Transportation while Pt is in Rehab?: No   Goals/Additional Needs Patient/Family Goal for Rehab: Mod I with PT and OT Expected length of stay: ELOS 7-9 days Equipment Needs: wound VAC Special Service Needs: HD MWF Pt/Family Agrees to Admission and willing to participate: Yes Program Orientation Provided & Reviewed with Pt/Caregiver Including Roles & Responsibilities: Yes   Decrease burden of Care through IP rehab admission: n/a  Possible need for SNF placement upon discharge:no  Patient Condition: This patient's condition remains as documented in the consult dated 7/8/2016ch the Rehabilitation Physician determined and documented that the patient's condition is appropriate for intensive rehabilitative care in an inpatient rehabilitation facility. Will admit to inpatient rehab today.  Preadmission Screen Completed By: Brandon Sharp, 12/02/2014 12:20 PM ______________________________________________________________________  Discussed status with Dr. Naaman Plummer On 12/02/14 at 1220 for approval for admission today.  Admission Coordinator: Brandon Sharp, time 4193 12/02/2014  Cosigned by: Meredith Staggers, MD at 12/02/2014 1:21 PM  Revision History     Date/Time User Provider Type Action   12/02/2014 1:21 PM Meredith Staggers, MD Physician  Cosign   12/02/2014 12:20 PM Brandon Gong, RN Rehab Admission Coordinator Sign

## 2014-12-02 NOTE — Progress Notes (Signed)
Physical Medicine and Rehabilitation Consult  Reason for Consult: Deconditioned due to anemia from thigh hematoma and PAD Referring Physician: Dr. Waldron Labs.    HPI: Brandon Sharp is a 69 y.o. male with history of DM type 2, CAD, CHF s/p Cardiac transplant, ESRD who was admitted on 11/16/14 from HD center due to profound weakness. He was found to be anemic due to right thigh hematoma s/p recent AVG placement. He was transfused with 2 units PRBC and was taken to OR on 11/21/14 for evacuation of Thigh hematoma with VAC placement. He developed right foot ischemia and pain therefore arteriogram recommended due to concerns of steal syndrome. He underwent aortogram with attempts at recanalization of R-SFA stent and evidence of medium length occlusion. Patient has elected on observation for now v/s R-FPBG or ligation of RLE AVG. Therapy ongoing and CIR recommended due to deconditioned state.    Review of Systems  Constitutional: Negative for fever.  Eyes: Negative for blurred vision.  Respiratory: Negative for cough.  Cardiovascular: Negative for chest pain.  Gastrointestinal: Negative for heartburn.  Genitourinary: Negative for dysuria.  Musculoskeletal: Positive for myalgias and joint pain.  Skin: Negative for rash.  Neurological: Positive for focal weakness. Negative for dizziness and headaches.  Endo/Heme/Allergies: Bruises/bleeds easily.  Psychiatric/Behavioral: Negative for depression.   Past Medical History  Diagnosis Date  . Diabetes mellitus   . Hypertension   . Myocardial infarction 1985; 1990  . Angina   . Dysrhythmia   . DVT (deep venous thrombosis) ~ 12/2010    LLE  . Anemia   . Blood transfusion 06/1999    post heart transplant  . Peripheral vascular disease   . Renal failure     Hemodialysis MWF last 2 years, sse dr [name] nephrology, goes to [facility]  . DVT (deep venous thrombosis) 08/25/2014     Nearly occluded R IJ, and L subclavian DVT  . CHF (congestive heart failure) 2001    beffore transplant; had AICD that was explanted 07/19/99 following successful heart transplant  . Coronary artery disease     transplant cardiologist Dr. Corky Mull [facility])  . Pneumonia    Past Surgical History  Procedure Laterality Date  . Nephrectomy transplanted organ  2008  . Av fistula repair      rt. a=fore arm fistula  . Av fistula placement  ~ 01/2010    "this was my 2nd fistula placement"  . Heart transplant  06/1999  . Colonoscopy  03/17/2012    Procedure: COLONOSCOPY; Surgeon: Lear Ng, MD; Location: [facility]; Service: Endoscopy; Laterality: N/A;  . Coronary artery bypass graft  1990    CABG X3  . Colonoscopy with propofol N/A 12/14/2013    Procedure: COLONOSCOPY WITH PROPOFOL; Surgeon: Lear Ng, MD; Location: [facility]; Service: Endoscopy; Laterality: N/A;  . Video bronchoscopy Bilateral 04/14/2014    Procedure: VIDEO BRONCHOSCOPY WITH FLUORO; Surgeon: Collene Gobble, MD; Location: [place]; Service: Cardiopulmonary; Laterality: Bilateral;  . Shuntogram Right 05/09/2014    Procedure: FISTULOGRAM; Surgeon: Conrad Stillmore, MD; Location: [facility] CATH LAB; Service: Cardiovascular; Laterality: Right;  . Coronary angioplasty with stent placement  1985; 1990    prior stents in the 80's and 90's then CABG; s/p heart transplant 2001 s/p BMS mid LAD and proximal RCA 04/21/13 [facility])  . Av fistula placement Right 10/20/2014    Procedure: INSERTION OF ARTERIOVENOUS (AV) GORE-TEX GRAFT THIGH; Surgeon: Conrad , MD; Location: [place]; Service: Vascular; Laterality: Right;  . Hematoma evacuation  Right 11/21/2014    Procedure: EVACUATION HEMATOMA Right Groin; Surgeon: Conrad Bern, MD; Location: [place]; Service: Vascular; Laterality: Right;  . Application of  wound vac Right 11/21/2014    Procedure: APPLICATION OF WOUND VAC Right Groin; Surgeon: Conrad White Stone, MD; Location: [place]; Service: Vascular; Laterality: Right;  . Peripheral vascular catheterization N/A 11/25/2014    Procedure: Abdominal Aortogram; Surgeon: Elam Dutch, MD; Location: [place] CV LAB; Service: Cardiovascular; Laterality: N/A;  . Lower extremity angiogram Right 11/25/2014    Procedure: Lower Extremity Angiogram; Surgeon: Elam Dutch, MD; Location: [place] [facility]; Service: Cardiovascular; Laterality: Right;   Family History  Problem Relation Age of Onset  . Heart disease Mother   . Hyperlipidemia Mother   . Hypertension Mother   . Diabetes Mother   . Hyperlipidemia Father   . Hypertension Father   . Heart disease Father   . Diabetes Brother   . Heart disease Brother     Heart Disease before age 26  . Hyperlipidemia Brother   . Hypertension Brother   . Heart attack Brother    Social History:  reports that he quit smoking about 26 years ago. His smoking use included Cigarettes. He has a 20 pack-year smoking history. He has never used smokeless tobacco. He reports that he drinks alcohol. He reports that he does not use illicit drugs. Allergies:  Allergies  Allergen Reactions  . Lisinopril Swelling    Lips and tongue swell  . Niacin And Related Other (See Comments)    unknown  . Norvasc [Amlodipine Besylate] Rash    Flushing  . Penicillins Rash   Medications Prior to Admission  Medication Sig Dispense Refill  . atorvastatin (LIPITOR) 40 MG tablet Take 40 mg by mouth daily.     Marland Kitchen azaTHIOprine (IMURAN) 50 MG tablet Take 75 mg by mouth every morning.     Marland Kitchen b complex-vitamin c-folic acid (NEPHRO-VITE) 0.8 MG TABS tablet Take 1 tablet by mouth at bedtime.    . calcium acetate (PHOSLO) 667 MG capsule Take 667 mg by mouth 3  (three) times daily with meals.     . carvedilol (COREG) 6.25 MG tablet Take 1 tablet (6.25 mg total) by mouth at bedtime. 30 tablet 0  . colesevelam (WELCHOL) 625 MG tablet Take 3,750 mg by mouth daily. Takes 6 tabs daily    . famotidine (PEPCID AC) 10 MG chewable tablet Chew 10 mg by mouth at bedtime.     Marland Kitchen HUMALOG KWIKPEN 100 UNIT/ML SOPN Inject 4-10 Units into the skin 2 (two) times daily. Per sliding scale    . insulin glargine (LANTUS) 100 UNIT/ML injection Inject 20 Units into the skin every morning.    . isosorbide dinitrate (ISOCHRON) 40 MG CR tablet Take 40 mg by mouth every 8 (eight) hours.  11  . predniSONE (DELTASONE) 5 MG tablet Take 1 tablet (5 mg total) by mouth daily with breakfast. 30 tablet 0  . Sirolimus 0.5 MG TABS Take 3 tablets by mouth daily.  11  . warfarin (COUMADIN) 5 MG tablet Take 1 tablet (5 mg total) by mouth daily. 30 tablet 0    Home: Home Living Family/patient expects to be discharged to:: Private residence Living Arrangements: Spouse/significant other Available Help at Discharge: Family, Available 24 hours/day Type of Home: House Home Access: Stairs to enter [employer] of Steps: 2 Entrance Stairs-Rails: None Home Layout: One level Home Equipment: Cane - single point, Shower seat Additional Comments: has a stool at  his sink, did not ambulate much in the community   Functional History: Prior Function Level of Independence: Independent with assistive device(s) Comments: wife states she helps him some with bathing and dressing. Pts wife states that pt did very well with HHPT back in November, however had difficulty maintaining active lifestyle and has become more reliant on her. She now has some health issues of her own and is unable to assist as she did before.  Functional Status:  Mobility: Bed Mobility Overal bed mobility: Modified Independent General bed mobility comments: with HOB flat  and without rails to better simulate home environment Transfers Overall transfer level: Needs assistance Equipment used: Rolling walker (2 wheeled) Transfers: Sit to/from Stand Sit to Stand: Min assist General transfer comment: Min A for facilitation of foward weight shift with cues for hand placement on bed instead of pulling up on RW.  Ambulation/Gait Ambulation/Gait assistance: Min assist, Mod assist (single instance of LOB requiring mod A ) Ambulation Distance (Feet): 20 Feet (x 4 reps (to wall across from room twice with standing rest break)) Assistive device: Rolling walker (2 wheeled) General Gait Details: Pt requires mod verbal cues for posture throughout gait along with safe when negotiating with RW, esp through tight spaces. Single instance of LOB posteriorly when backing up to recliner, requiring mod A to correct.  Gait Pattern/deviations: Decreased stride length, Trunk flexed, Narrow base of support, Shuffle Gait velocity: decreased    ADL:    Cognition: Cognition Overall Cognitive Status: Within Functional Limits for tasks assessed Orientation Level: Oriented X4 Cognition Arousal/Alertness: Awake/alert Behavior During Therapy: WFL for tasks assessed/performed Overall Cognitive Status: Within Functional Limits for tasks assessed  Blood pressure 113/50, pulse 87, temperature 98.4 F (36.9 C), temperature source Oral, resp. rate 18, height 6' (1.829 m), weight 69.2 kg (152 lb 8.9 oz), SpO2 100 %. Physical Exam  Constitutional: He is oriented to person, place, and time. He appears well-developed and well-nourished.  HENT:  Head: Normocephalic and atraumatic.  Right Ear: External ear normal.  Left Ear: External ear normal.  Eyes: Conjunctivae and EOM are normal. Pupils are equal, round, and reactive to light.  Neck: Normal range of motion.  Cardiovascular: Normal rate and normal heart sounds.  Respiratory: No respiratory distress.  GI: He exhibits no distension.    Musculoskeletal: He exhibits edema.  Right thigh vac in place. Leg with swelling. Thigh tender to ROM/palpation.  Neurological: He is alert and oriented to person, place, and time. He has normal reflexes. No cranial nerve deficit.  UE: 5/5 prox to distal. RLE: 2+hf, 3 ke, 4 adf/apf, LLE: 3 hf, 3+ke, 4 adf/apf. No sensory deficits.  Skin: Skin is dry.  Psychiatric: He has a normal mood and affect. His behavior is normal.     Lab Results Last 24 Hours    Results for orders placed or performed during the hospital encounter of 11/16/14 (from the past 24 hour(s))  Glucose, capillary Status: Abnormal   Collection Time: 11/30/14 10:50 PM  Result Value Ref Range   Glucose-Capillary 106 (H) 65 - 99 mg/dL   Comment 1 Notify RN    Comment 2 Document in Chart   Heparin level (unfractionated) Status: None   Collection Time: 12/01/14 3:40 AM  Result Value Ref Range   Heparin Unfractionated 0.41 0.30 - 0.70 IU/mL  CBC Status: Abnormal   Collection Time: 12/01/14 3:40 AM  Result Value Ref Range   WBC 3.9 (L) 4.0 - 10.5 K/uL   RBC 2.69 (L) 4.22 -  5.81 MIL/uL   Hemoglobin 7.9 (L) 13.0 - 17.0 g/dL   HCT 25.1 (L) 39.0 - 52.0 %   MCV 93.3 78.0 - 100.0 fL   MCH 29.4 26.0 - 34.0 pg   MCHC 31.5 30.0 - 36.0 g/dL   RDW 19.0 (H) 11.5 - 15.5 %   Platelets 54 (L) 150 - 400 K/uL  Protime-INR Status: Abnormal   Collection Time: 12/01/14 3:40 AM  Result Value Ref Range   Prothrombin Time 16.5 (H) 11.6 - 15.2 seconds   INR 1.32 0.00 - 1.49  Glucose, capillary Status: None   Collection Time: 12/01/14 6:21 AM  Result Value Ref Range   Glucose-Capillary 94 65 - 99 mg/dL  Glucose, capillary Status: Abnormal   Collection Time: 12/01/14 11:15 AM  Result Value Ref Range   Glucose-Capillary 104 (H) 65 - 99 mg/dL   Comment 1 Notify RN       Imaging Results (Last 48  hours)    No results found.    Assessment/Plan: Diagnosis: R thigh hematoma, PAD with subsequent weakness/ gait instability 1. Does the need for close, 24 hr/day medical supervision in concert with the patient's rehab needs make it unreasonable for this patient to be served in a less intensive setting? Yes 2. Co-Morbidities requiring supervision/potential complications: ESRD, htn, dm 3. Due to bowel management, safety, skin/wound care, disease management, medication administration, pain management and patient education, does the patient require 24 hr/day rehab nursing? Yes 4. Does the patient require coordinated care of a physician, rehab nurse, PT (1-2 hrs/day, 5 days/week) and OT (1-2 hrs/day, 5 days/week) to address physical and functional deficits in the context of the above medical diagnosis(es)? Yes Addressing deficits in the following areas: balance, endurance, locomotion, strength, transferring, bowel/bladder control, bathing, dressing, feeding, grooming, toileting and psychosocial support 5. Can the patient actively participate in an intensive therapy program of at least 3 hrs of therapy per day at least 5 days per week? Yes 6. The potential for patient to make measurable gains while on inpatient rehab is excellent 7. Anticipated functional outcomes upon discharge from inpatient rehab are modified independent with PT, modified independent with OT, n/a with SLP. 8. Estimated rehab length of stay to reach the above functional goals is: 7-9 days 9. Does the patient have adequate social supports and living environment to accommodate these discharge functional goals? Yes 10. Anticipated D/C setting: Home 11. Anticipated post D/C treatments: HH therapy and Outpatient therapy 12. Overall Rehab/Functional Prognosis: excellent  RECOMMENDATIONS: This patient's condition is appropriate for continued rehabilitative care in the following setting: CIR Patient has agreed to participate in  recommended program. Yes and Potentially Note that insurance prior authorization may be required for reimbursement for recommended care.  Comment: Rehab Admissions Coordinator to follow up.  Thanks,  Meredith Staggers, MD, Mellody Drown     12/01/2014       Revision History     Date/Time User Provider Type Action   12/02/2014 10:58 AM Meredith Staggers, MD Physician Sign   12/01/2014 4:44 PM Bary Leriche, PA-C Physician Assistant Pend   View Details Report       Routing History     Date/Time From To Method   12/02/2014 10:58 AM Meredith Staggers, MD Meredith Staggers, MD In Basket   12/02/2014 10:58 AM Meredith Staggers, MD [name], MD Fax

## 2014-12-02 NOTE — Progress Notes (Signed)
I met with pt at bedside and he is in agreement to admission to inpt rehab today after dialysis. I will contact Dr. Waldron Labs and make the arrangements. RN CM is aware. [phone]

## 2014-12-02 NOTE — Telephone Encounter (Addendum)
-----   Message from Mena Goes, RN sent at 12/01/2014 11:32 AM EDT ----- Regarding: Schedule   ----- Message -----    From: Alvia Grove, PA-C    Sent: 12/01/2014  10:59 AM      To: [facility] Charge Pool  S/p R groin hematoma evacuation, VAC placement, s/p R thigh AVG, R leg runoff   F/u with Dr. Bridgett Larsson in 2 weeks.   Thanks Kim  notified patient's wife of post op appt. on 12-16-14 at 3:45

## 2014-12-02 NOTE — Plan of Care (Signed)
Problem: Problem: Bowel/Bladder Progression Goal: BOWEL SOUNDS PRESENT/NON-DECREASED Outcome: Progressing Bowel sounds present, still no BM.

## 2014-12-03 ENCOUNTER — Inpatient Hospital Stay (HOSPITAL_COMMUNITY): Payer: Medicare Other | Admitting: Occupational Therapy

## 2014-12-03 ENCOUNTER — Inpatient Hospital Stay (HOSPITAL_COMMUNITY): Payer: Medicare Other | Admitting: *Deleted

## 2014-12-03 DIAGNOSIS — E1129 Type 2 diabetes mellitus with other diabetic kidney complication: Secondary | ICD-10-CM

## 2014-12-03 DIAGNOSIS — S7011XA Contusion of right thigh, initial encounter: Secondary | ICD-10-CM | POA: Diagnosis present

## 2014-12-03 LAB — GLUCOSE, CAPILLARY
GLUCOSE-CAPILLARY: 134 mg/dL — AB (ref 65–99)
Glucose-Capillary: 108 mg/dL — ABNORMAL HIGH (ref 65–99)
Glucose-Capillary: 150 mg/dL — ABNORMAL HIGH (ref 65–99)
Glucose-Capillary: 163 mg/dL — ABNORMAL HIGH (ref 65–99)

## 2014-12-03 LAB — CBC
HCT: 30.8 % — ABNORMAL LOW (ref 39.0–52.0)
HEMOGLOBIN: 9.8 g/dL — AB (ref 13.0–17.0)
MCH: 29 pg (ref 26.0–34.0)
MCHC: 31.8 g/dL (ref 30.0–36.0)
MCV: 91.1 fL (ref 78.0–100.0)
Platelets: 80 10*3/uL — ABNORMAL LOW (ref 150–400)
RBC: 3.38 MIL/uL — ABNORMAL LOW (ref 4.22–5.81)
RDW: 19.7 % — ABNORMAL HIGH (ref 11.5–15.5)
WBC: 5.4 10*3/uL (ref 4.0–10.5)

## 2014-12-03 LAB — TYPE AND SCREEN
ABO/RH(D): O POS
ANTIBODY SCREEN: NEGATIVE
Unit division: 0

## 2014-12-03 LAB — HEPARIN LEVEL (UNFRACTIONATED)
HEPARIN UNFRACTIONATED: 0.27 [IU]/mL — AB (ref 0.30–0.70)
Heparin Unfractionated: 0.15 IU/mL — ABNORMAL LOW (ref 0.30–0.70)
Heparin Unfractionated: 0.37 IU/mL (ref 0.30–0.70)

## 2014-12-03 LAB — PROTIME-INR
INR: 1.65 — AB (ref 0.00–1.49)
Prothrombin Time: 19.5 seconds — ABNORMAL HIGH (ref 11.6–15.2)

## 2014-12-03 MED ORDER — WARFARIN SODIUM 7.5 MG PO TABS
7.5000 mg | ORAL_TABLET | Freq: Once | ORAL | Status: AC
  Administered 2014-12-03: 7.5 mg via ORAL
  Filled 2014-12-03: qty 1

## 2014-12-03 MED ORDER — FLEET ENEMA 7-19 GM/118ML RE ENEM
1.0000 | ENEMA | Freq: Once | RECTAL | Status: AC
  Administered 2014-12-03: 1 via RECTAL

## 2014-12-03 NOTE — H&P (View-Only) (Signed)
Physical Medicine and Rehabilitation Admission H&P    Chief Complaint  Patient presents with  .    HPI: Brandon Sharp is a 69 y.o. male with history of DM type 2, CAD, CHF s/p Cardiac transplant, ESRD who was admitted on 11/16/14 from HD center due to profound weakness. He was found to be anemic due to right thigh hematoma s/p recent AVG placement. He was transfused with 2 units PRBC and was taken to OR on 11/21/14 for evacuation of Thigh hematoma with VAC placement. He developed right foot ischemia and pain therefore arteriogram recommended due to concerns of steal syndrome. He underwent aortogram with attempts at recanalization of R-SFA stent and evidence of medium length occlusion. Patient has elected on observation for now v/s R-FPBG or ligation of RLE AVG. Therapy ongoing and CIR recommended due to deconditioned state.    ROS  Constitutional: Negative for fever.  Eyes: Negative for blurred vision.  Respiratory: Negative for cough.   Cardiovascular: Negative for chest pain.  Gastrointestinal: Negative for heartburn. Nausea/vomiting past lunch today. No BM since Sunday. Genitourinary: Negative for dysuria.  Musculoskeletal: Positive for myalgias and joint pain.  Skin: Negative for rash.  Neurological: Positive for focal weakness. Negative for dizziness and headaches.  Endo/Heme/Allergies: Bruises/bleeds easily.  Psychiatric/Behavioral: Negative for depression   Past Medical History  Diagnosis Date  . Diabetes mellitus   . Hypertension   . Myocardial infarction 1985; 1990  . Angina   . Dysrhythmia   . DVT (deep venous thrombosis) ~ 12/2010    LLE  . Anemia   . Blood transfusion 06/1999    post heart transplant  . Peripheral vascular disease   . Renal failure     Hemodialysis MWF last 2 years, sse dr [name] nephrology, goes to [facility]  . DVT (deep venous thrombosis) 08/25/2014    Nearly occluded R IJ, and L subclavian DVT  . CHF (congestive  heart failure) 2001    beffore transplant; had AICD that was explanted 07/19/99 following successful heart transplant  . Coronary artery disease     transplant cardiologist Dr. Corky Mull [facility])  . Pneumonia     Past Surgical History  Procedure Laterality Date  . Nephrectomy transplanted organ  2008  . Av fistula repair      rt. a=fore arm fistula  . Av fistula placement  ~ 01/2010    "this was my 2nd fistula placement"  . Heart transplant  06/1999  . Colonoscopy  03/17/2012    Procedure: COLONOSCOPY;  Surgeon: Lear Ng, MD;  Location: [facility];  Service: Endoscopy;  Laterality: N/A;  . Coronary artery bypass graft  1990    CABG X3  . Colonoscopy with propofol N/A 12/14/2013    Procedure: COLONOSCOPY WITH PROPOFOL;  Surgeon: Lear Ng, MD;  Location: [facility];  Service: Endoscopy;  Laterality: N/A;  . Video bronchoscopy Bilateral 04/14/2014    Procedure: VIDEO BRONCHOSCOPY WITH FLUORO;  Surgeon: Collene Gobble, MD;  Location: [place];  Service: Cardiopulmonary;  Laterality: Bilateral;  . Shuntogram Right 05/09/2014    Procedure: FISTULOGRAM;  Surgeon: Conrad Halfway, MD;  Location: [facility] CATH LAB;  Service: Cardiovascular;  Laterality: Right;  . Coronary angioplasty with stent placement  1985; 1990    prior stents in the 80's and 90's then CABG; s/p heart transplant 2001 s/p BMS mid LAD and proximal RCA 04/21/13 [facility])  . Av fistula placement Right 10/20/2014    Procedure: INSERTION OF ARTERIOVENOUS (  AV) GORE-TEX GRAFT THIGH;  Surgeon: Conrad Brocket, MD;  Location: [place];  Service: Vascular;  Laterality: Right;  . Hematoma evacuation Right 11/21/2014    Procedure: EVACUATION HEMATOMA Right Groin;  Surgeon: Conrad Woodville, MD;  Location: [place];  Service: Vascular;  Laterality: Right;  . Application of wound vac Right 11/21/2014    Procedure: APPLICATION OF WOUND VAC Right Groin;  Surgeon: Conrad Stonybrook, MD;  Location: [place];  Service: Vascular;  Laterality: Right;   . Peripheral vascular catheterization N/A 11/25/2014    Procedure: Abdominal Aortogram;  Surgeon: Elam Dutch, MD;  Location: [facility] LAB;  Service: Cardiovascular;  Laterality: N/A;  . Lower extremity angiogram Right 11/25/2014    Procedure: Lower Extremity Angiogram;  Surgeon: Elam Dutch, MD;  Location: [place] CV LAB;  Service: Cardiovascular;  Laterality: Right;    Family History  Problem Relation Age of Onset  . Heart disease Mother   . Hyperlipidemia Mother   . Hypertension Mother   . Diabetes Mother   . Hyperlipidemia Father   . Hypertension Father   . Heart disease Father   . Diabetes Brother   . Heart disease Brother     Heart Disease before age 49  . Hyperlipidemia Brother   . Hypertension Brother   . Heart attack Brother     Social History:  Married. Disabled journalist.  Independent and drives to dialysis. Has been deconditioned since last fall.  He reports that he quit smoking about 26 years ago. His smoking use included Cigarettes. He has a 20 pack-year smoking history. He has never used smokeless tobacco. He reports that he drinks alcohol. He reports that he does not use illicit drugs.   Allergies  Allergen Reactions  . Lisinopril Swelling    Lips and tongue swell  . Niacin And Related Other (See Comments)    unknown  . Norvasc [Amlodipine Besylate] Rash    Flushing  . Penicillins Rash    Medications Prior to Admission  Medication Sig Dispense Refill  . atorvastatin (LIPITOR) 40 MG tablet Take 40 mg by mouth daily.      Marland Kitchen azaTHIOprine (IMURAN) 50 MG tablet Take 75 mg by mouth every morning.     Marland Kitchen b complex-vitamin c-folic acid (NEPHRO-VITE) 0.8 MG TABS tablet Take 1 tablet by mouth at bedtime.    . calcium acetate (PHOSLO) 667 MG capsule Take 667 mg by mouth 3 (three) times daily with meals.     . carvedilol (COREG) 6.25 MG tablet Take 1 tablet (6.25 mg total) by mouth at bedtime. 30 tablet 0  . colesevelam (WELCHOL) 625 MG tablet Take  3,750 mg by mouth daily. Takes 6 tabs daily    . famotidine (PEPCID AC) 10 MG chewable tablet Chew 10 mg by mouth at bedtime.     Marland Kitchen HUMALOG KWIKPEN 100 UNIT/ML SOPN Inject 4-10 Units into the skin 2 (two) times daily. Per sliding scale    . insulin glargine (LANTUS) 100 UNIT/ML injection Inject 20 Units into the skin every morning.    . isosorbide dinitrate (ISOCHRON) 40 MG CR tablet Take 40 mg by mouth every 8 (eight) hours.  11  . predniSONE (DELTASONE) 5 MG tablet Take 1 tablet (5 mg total) by mouth daily with breakfast. 30 tablet 0  . Sirolimus 0.5 MG TABS Take 3 tablets by mouth daily.  11  . [DISCONTINUED] warfarin (COUMADIN) 5 MG tablet Take 1 tablet (5 mg total) by mouth daily. 30 tablet  0    Home: Home Living Family/patient expects to be discharged to:: Private residence Living Arrangements: Spouse/significant other Available Help at Discharge: Family, Available 24 hours/day Type of Home: House Home Access: Stairs to enter [employer] of Steps: 2 Entrance Stairs-Rails: None Home Layout: One level Home Equipment: Cane - single point, Shower seat Additional Comments: has a stool at his sink, did not ambulate much in the community   Lives With: Spouse   Functional History: Prior Function Level of Independence: Independent with assistive device(s) Comments: wife states she helps him some with bathing and dressing.  Pts wife states that pt did very well with HHPT back in November, however had difficulty maintaining active lifestyle and has become more reliant on her.  She now has some health issues of her own and is unable to assist as she did before.   Functional Status:  Mobility: Bed Mobility Overal bed mobility: Modified Independent General bed mobility comments: with HOB flat and without rails to better simulate home environment Transfers Overall transfer level: Needs assistance Equipment used: Rolling walker (2 wheeled) Transfers: Sit to/from Stand Sit to  Stand: Min assist General transfer comment: Min A for facilitation of foward weight shift with cues for hand placement on bed instead of pulling up on RW.  Ambulation/Gait Ambulation/Gait assistance: Min assist, Mod assist (single instance of LOB requiring mod A ) Ambulation Distance (Feet): 20 Feet (x 4 reps (to wall across from room twice with standing rest break)) Assistive device: Rolling walker (2 wheeled) General Gait Details: Pt requires mod verbal cues for posture throughout gait along with safe when negotiating with RW, esp through tight spaces.  Single instance of LOB posteriorly when backing up to recliner, requiring mod A to correct.  Gait Pattern/deviations: Decreased stride length, Trunk flexed, Narrow base of support, Shuffle Gait velocity: decreased    ADL:    Cognition: Cognition Overall Cognitive Status: Within Functional Limits for tasks assessed Orientation Level: Oriented X4 Cognition Arousal/Alertness: Awake/alert Behavior During Therapy: WFL for tasks assessed/performed Overall Cognitive Status: Within Functional Limits for tasks assessed   Blood pressure 138/48, pulse 62, temperature 98.5 F (36.9 C), temperature source Oral, resp. rate 18, height 6' (1.829 m), weight 70.5 kg (155 lb 6.8 oz), SpO2 99 %. Physical Exam Constitutional: He is oriented to person, place, and time. He appears well-developed and well-nourished.  HENT:   Head: Normocephalic and atraumatic.  Right Ear: External ear normal.  Left Ear: External ear normal.  Eyes: Conjunctivae and EOM are normal. Pupils are equal, round, and reactive to light.  Neck: Normal range of motion.  Cardiovascular: Normal rate and normal heart sounds.   Respiratory: No respiratory distress.  GI: He exhibits no distension.  Musculoskeletal: He exhibits edema.  Right thigh vac in place and Leg with swelling. Thigh tender to ROM/palpation.  LUE with 1-2 +edema.  Neurological: He is alert and oriented to person,  place, and time. He has normal reflexes. No cranial nerve deficit.  UE: 5/5 prox to distal. RLE: 2+hf, 3 ke, 4 adf/apf, LLE: 3 hf, 3+ke, 4 adf/apf.  No sensory deficits.   Skin: Skin is dry.  Psychiatric: He has a normal mood and affect. His behavior is normal.   Results for orders placed or performed during the hospital encounter of 11/16/14 (from the past 48 hour(s))  Glucose, capillary     Status: Abnormal   Collection Time: 11/30/14  4:08 PM  Result Value Ref Range   Glucose-Capillary 103 (H) 65 - 99  mg/dL  Glucose, capillary     Status: Abnormal   Collection Time: 11/30/14 10:50 PM  Result Value Ref Range   Glucose-Capillary 106 (H) 65 - 99 mg/dL   Comment 1 Notify RN    Comment 2 Document in Chart   Heparin level (unfractionated)     Status: None   Collection Time: 12/01/14  3:40 AM  Result Value Ref Range   Heparin Unfractionated 0.41 0.30 - 0.70 IU/mL    Comment:        IF HEPARIN RESULTS ARE BELOW EXPECTED VALUES, AND PATIENT DOSAGE HAS BEEN CONFIRMED, SUGGEST FOLLOW UP TESTING OF ANTITHROMBIN III LEVELS.   CBC     Status: Abnormal   Collection Time: 12/01/14  3:40 AM  Result Value Ref Range   WBC 3.9 (L) 4.0 - 10.5 K/uL   RBC 2.69 (L) 4.22 - 5.81 MIL/uL   Hemoglobin 7.9 (L) 13.0 - 17.0 g/dL   HCT 25.1 (L) 39.0 - 52.0 %   MCV 93.3 78.0 - 100.0 fL   MCH 29.4 26.0 - 34.0 pg   MCHC 31.5 30.0 - 36.0 g/dL   RDW 19.0 (H) 11.5 - 15.5 %   Platelets 54 (L) 150 - 400 K/uL    Comment: REPEATED TO VERIFY PLATELET COUNT CONFIRMED BY SMEAR   Protime-INR     Status: Abnormal   Collection Time: 12/01/14  3:40 AM  Result Value Ref Range   Prothrombin Time 16.5 (H) 11.6 - 15.2 seconds   INR 1.32 0.00 - 1.49  Glucose, capillary     Status: None   Collection Time: 12/01/14  6:21 AM  Result Value Ref Range   Glucose-Capillary 94 65 - 99 mg/dL  Glucose, capillary     Status: Abnormal   Collection Time: 12/01/14 11:15 AM  Result Value Ref Range   Glucose-Capillary 104 (H) 65 -  99 mg/dL   Comment 1 Notify RN   Glucose, capillary     Status: Abnormal   Collection Time: 12/01/14  4:35 PM  Result Value Ref Range   Glucose-Capillary 121 (H) 65 - 99 mg/dL   Comment 1 Notify RN   Glucose, capillary     Status: Abnormal   Collection Time: 12/01/14  8:54 PM  Result Value Ref Range   Glucose-Capillary 133 (H) 65 - 99 mg/dL  CBC     Status: Abnormal   Collection Time: 12/02/14  3:12 AM  Result Value Ref Range   WBC 4.2 4.0 - 10.5 K/uL   RBC 2.55 (L) 4.22 - 5.81 MIL/uL   Hemoglobin 7.4 (L) 13.0 - 17.0 g/dL   HCT 23.8 (L) 39.0 - 52.0 %   MCV 93.3 78.0 - 100.0 fL   MCH 29.0 26.0 - 34.0 pg   MCHC 31.1 30.0 - 36.0 g/dL   RDW 19.1 (H) 11.5 - 15.5 %   Platelets 54 (L) 150 - 400 K/uL    Comment: REPEATED TO VERIFY PLATELET COUNT CONFIRMED BY SMEAR   Protime-INR     Status: Abnormal   Collection Time: 12/02/14  3:12 AM  Result Value Ref Range   Prothrombin Time 19.3 (H) 11.6 - 15.2 seconds   INR 1.62 (H) 0.00 - 1.49  Renal function panel     Status: Abnormal   Collection Time: 12/02/14  3:12 AM  Result Value Ref Range   Sodium 136 135 - 145 mmol/L   Potassium 3.3 (L) 3.5 - 5.1 mmol/L   Chloride 101 101 - 111 mmol/L   CO2 28  22 - 32 mmol/L   Glucose, Bld 100 (H) 65 - 99 mg/dL   BUN 23 (H) 6 - 20 mg/dL   Creatinine, Ser 6.67 (H) 0.61 - 1.24 mg/dL   Calcium 9.2 8.9 - 10.3 mg/dL   Phosphorus 2.7 2.5 - 4.6 mg/dL   Albumin 2.4 (L) 3.5 - 5.0 g/dL   GFR calc non Af Amer 8 (L) >60 mL/min   GFR calc Af Amer 9 (L) >60 mL/min    Comment: (NOTE) The eGFR has been calculated using the CKD EPI equation. This calculation has not been validated in all clinical situations. eGFR's persistently <60 mL/min signify possible Chronic Kidney Disease.    Anion gap 7 5 - 15  Heparin level (unfractionated)     Status: None   Collection Time: 12/02/14  3:12 AM  Result Value Ref Range   Heparin Unfractionated 0.39 0.30 - 0.70 IU/mL    Comment:        IF HEPARIN RESULTS ARE  BELOW EXPECTED VALUES, AND PATIENT DOSAGE HAS BEEN CONFIRMED, SUGGEST FOLLOW UP TESTING OF ANTITHROMBIN III LEVELS.   Glucose, capillary     Status: None   Collection Time: 12/02/14  6:51 AM  Result Value Ref Range   Glucose-Capillary 91 65 - 99 mg/dL  Prepare RBC     Status: None   Collection Time: 12/02/14  9:00 AM  Result Value Ref Range   Order Confirmation ORDER PROCESSED BY BLOOD BANK   Type and screen     Status: None (Preliminary result)   Collection Time: 12/02/14  9:00 AM  Result Value Ref Range   ABO/RH(D) O POS    Antibody Screen NEG    Sample Expiration 12/05/2014    Unit Number [id number]    Blood Component Type RBC LR PHER1    Unit division 00    Status of Unit ALLOCATED    Transfusion Status OK TO TRANSFUSE    Crossmatch Result Compatible   Glucose, capillary     Status: Abnormal   Collection Time: 12/02/14 11:12 AM  Result Value Ref Range   Glucose-Capillary 114 (H) 65 - 99 mg/dL   No results found.     Medical Problem List and Plan: 1. Functional deficits secondary to  R thigh hematoma, PAD with subsequent weakness/ gait instability 2.  DVT Prophylaxis/Anticoagulation: Pharmaceutical: Coumadin and Heparin   -daily coags, follow for bleeding complications 3. Pain Management:  Have ordered Oxycodone prn  -may need further adjustment with increased activity on rehab 4. Mood: LCSW to follow for evaluation and support. 5. Neuropsych: This patient is capable of making decisions on his own behalf. 6. Skin/Wound Care: Continue VAC right thigh at all times  -change MWF 7. Fluids/Electrolytes/Nutrition: Monitor I/O. Have ordered follow up labs for monday 8. DM type2 : Monitor BS ac/hs 9. Cardiac transplant: On imuran and sirolimus.  10. ESRD: HD on MWF after therapy sessions to help with tolerance of therapy.  11. Constipation: bowel regimen. Observe for patttern    Post Admission Physician Evaluation: 1. Functional deficits secondary  to R thigh  hematoma, PAD with subsequent weakness/ gait instability 2. Patient is admitted to receive collaborative, interdisciplinary care between the physiatrist, rehab nursing staff, and therapy team. 3. Patient's level of medical complexity and substantial therapy needs in context of that medical necessity cannot be provided at a lesser intensity of care such as a SNF. 4. Patient has experienced substantial functional loss from his/her baseline which was documented above under the "Functional History" and "  Functional Status" headings.  Judging by the patient's diagnosis, physical exam, and functional history, the patient has potential for functional progress which will result in measurable gains while on inpatient rehab.  These gains will be of substantial and practical use upon discharge  in facilitating mobility and self-care at the household level. 5. Physiatrist will provide 24 hour management of medical needs as well as oversight of the therapy plan/treatment and provide guidance as appropriate regarding the interaction of the two. 6. 24 hour rehab nursing will assist with bladder management, bowel management, safety, skin/wound care, disease management, medication administration, pain management and patient education  and help integrate therapy concepts, techniques,education, etc. 7. PT will assess and treat for/with: Lower extremity strength, range of motion, stamina, balance, functional mobility, safety, adaptive techniques and equipment, pain mgt, vac manipulation.   Goals are: mod I. 8. OT will assess and treat for/with: ADL's, functional mobility, safety, upper extremity strength, adaptive techniques and equipment, vac manipulation, pain mgt, .   Goals are: mod I. Therapy may not proceed with showering this patient. 9. SLP will assess and treat for/with: n/a.  Goals are: n/a. 10. Case Management and Social Worker will assess and treat for psychological issues and discharge planning. 11. Team conference  will be held weekly to assess progress toward goals and to determine barriers to discharge. 12. Patient will receive at least 3 hours of therapy per day at least 5 days per week. 13. ELOS: 7-10 days       14. Prognosis:  excellent     Meredith Staggers, MD, [facility] 12/02/2014   12/02/2014

## 2014-12-03 NOTE — Progress Notes (Addendum)
Occupational Therapy Session Note  Patient Details  Name: JOVAUGHN WOJTASZEK MRN: [id number] Date of Birth: 1945-11-16  Today's Date: 12/03/2014 OT Individual Time: 1500-1530 OT Individual Time Calculation (min): 30 min    Short Term Goals: Week 1:  OT Short Term Goal 1 (Week 1): STGs = LTGs  Skilled Therapeutic Interventions/Progress Updates:  Balance/vestibular training;Cognitive remediation/compensation;Discharge planning;DME/adaptive equipment instruction;Functional mobility training;Patient/family education;Self Care/advanced ADL retraining;Psychosocial support;Therapeutic Activities;Therapeutic Exercise;UE/LE Strength taining/ROM       Engaged in UE/LE therapeutic exerices.  Pt reported no pain.  Faitigued quickly.  Rest break after each exercise.  Transferred to bed with SBA.  Left with all needs in reach.   Therapy Documentation Precautions:  Precautions Precautions: Fall Precaution Comments: Wound vac on R groin  Restrictions Weight Bearing Restrictions: No      Pain: Pain Assessment Pain Assessment: 0-10 Pain Score: 5  Pain Location: Groin Pain Orientation: Right Pain Descriptors / Indicators: Aching Pain Frequency: Intermittent Pain Intervention(s): Medication (See eMAR) ADL: ADL ADL Comments: refer to FIM      See FIM for current functional status  Therapy/Group: Individual Therapy  Lisa Roca 12/03/2014, 7:18 PM

## 2014-12-03 NOTE — Interval H&P Note (Signed)
Brandon Sharp was admitted yesterday to Inpatient Rehabilitation with the diagnosis of deconditioning related to right thigh hematoma.  The patient's history has been reviewed, patient examined, and there is no change in status.  Patient continues to be appropriate for intensive inpatient rehabilitation.  I have reviewed the patient's chart and labs.  Questions were answered to the patient's satisfaction. The PAPE has been reviewed and assessment remains appropriate. This encounter and document were completed on 12/02/14. The H&P is now being placed in the rehab encounter for the purpose of charting.   SWARTZ,ZACHARY T 12/03/2014, 8:56 AM

## 2014-12-03 NOTE — Progress Notes (Signed)
ANTICOAGULATION CONSULT NOTE - Follow Up Consult  Pharmacy Consult for Heparin + Warfarin  Indication: History of recent DVT (on Coumadin PTA since March 2016)   Allergies  Allergen Reactions  . Lisinopril Swelling    Lips and tongue swell  . Niacin And Related Other (See Comments)    unknown  . Norvasc [Amlodipine Besylate] Rash    Flushing  . Penicillins Rash    Patient Measurements: Weight: 157 lb 10.1 oz (71.5 kg)   Vital Signs: Temp: 98.9 F (37.2 C) (07/09 0629) Temp Source: Oral (07/09 0629) BP: 116/46 mmHg (07/09 0939) Pulse Rate: 103 (07/09 0939)  Labs:  Recent Labs  12/01/14 0340 12/02/14 0312 12/02/14 1554 12/02/14 1800 12/03/14 0504  HGB 7.9* 7.4*  --  8.1* 9.8*  HCT 25.1* 23.8*  --  25.8* 30.8*  PLT 54* 54*  --  69* 80*  LABPROT 16.5* 19.3*  --   --  19.5*  INR 1.32 1.62*  --   --  1.65*  HEPARINUNFRC 0.41 0.39  --   --  0.15*  CREATININE  --  6.67* 7.42*  --   --     Estimated Creatinine Clearance: 9.5 mL/min (by C-G formula based on Cr of 7.42).   Assessment: 69 y.o. on Coumadin PTA for hx of DVT(March 2016), s/p hematoma evacuation 6/27. 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 Patient has elected for observation at this time and no surgery is planned. HL is slightly SUBtherapeutic (HL 0.27 << 0.39, goal of 0.3-0.7) INR remains SUBtherapeutic (INR 1.65 << 1.62, goal of 2-3). Hgb up to 9.8 s/p 1 unit PRBC with HD on 7/8. Thrombocytopenia improving, plts 90 << 69. No bleeding noted.  Goal of Therapy:  INR 2-3 Heparin level 0.3-0.7 units/ml Monitor platelets by anticoagulation protocol: Yes   Plan:  1. Increase heparin drip rate slightly to 1200 units/hr (12 ml/hr) 2. Warfarin 7.5 mg x 1 dose at 1800 today 3. Will continue to monitor for any signs/symptoms of bleeding and will follow up with heparin level in 8 hours and PT/INR in the AM  Alycia Rossetti, PharmD, BCPS Clinical Pharmacist Pager: [phone] 12/03/2014 10:48 AM

## 2014-12-03 NOTE — Progress Notes (Signed)
ANTICOAGULATION CONSULT NOTE - Follow Up Consult  Pharmacy Consult for heparin Indication: hx of recent DVT  Allergies  Allergen Reactions  . Lisinopril Swelling    Lips and tongue swell  . Niacin And Related Other (See Comments)    unknown  . Norvasc [Amlodipine Besylate] Rash    Flushing  . Penicillins Rash    Patient Measurements: Weight: 157 lb 10.1 oz (71.5 kg) Heparin Dosing Weight:   Vital Signs: Temp: 98.2 F (36.8 C) (07/09 1506) Temp Source: Oral (07/09 1506) BP: 117/46 mmHg (07/09 1506) Pulse Rate: 88 (07/09 1506)  Labs:  Recent Labs  12/01/14 0340 12/02/14 0312 12/02/14 1554 12/02/14 1800 12/03/14 0504 12/03/14 1040 12/03/14 1938  HGB 7.9* 7.4*  --  8.1* 9.8*  --   --   HCT 25.1* 23.8*  --  25.8* 30.8*  --   --   PLT 54* 54*  --  69* 80*  --   --   LABPROT 16.5* 19.3*  --   --  19.5*  --   --   INR 1.32 1.62*  --   --  1.65*  --   --   HEPARINUNFRC 0.41 0.39  --   --  0.15* 0.27* 0.37  CREATININE  --  6.67* 7.42*  --   --   --   --     Estimated Creatinine Clearance: 9.5 mL/min (by C-G formula based on Cr of 7.42).   Medications:  Scheduled:  . atorvastatin  40 mg Oral Daily  . azaTHIOprine  75 mg Oral q morning - 10a  . calcium acetate  667 mg Oral BID WC  . carvedilol  6.25 mg Oral QHS  . colesevelam  3,750 mg Oral Daily  . [START ON 12/07/2014] darbepoetin (ARANESP) injection - DIALYSIS  200 mcg Intravenous Q Wed-HD  . [START ON 12/05/2014] doxercalciferol  4 mcg Intravenous Q M,W,F-HD  . famotidine  10 mg Oral QHS  . feeding supplement (PRO-STAT SUGAR FREE 64)  30 mL Oral BID  . insulin aspart  0-5 Units Subcutaneous QHS  . isosorbide dinitrate  40 mg Oral 3 times per day  . multivitamin  1 tablet Oral QHS  . pantoprazole  40 mg Oral Daily  . predniSONE  5 mg Oral Q breakfast  . senna-docusate  2 tablet Oral QHS  . Sirolimus  1.5 mg Oral Daily  . Warfarin - Pharmacist Dosing Inpatient   Does not apply q1800   Infusions:  . heparin  1,200 Units/hr (12/03/14 1830)    Assessment: 69 yo male with hx of recent DVT is currently on therapeutic heparin.  Heparin level is 0.37.   Goal of Therapy:  Heparin level 0.3-0.7 units/ml Monitor platelets by anticoagulation protocol: Yes   Plan:  - continue heparin at 1200 units/hr - heparin level in am  English Craighead, Tsz-Yin 12/03/2014,8:40 PM

## 2014-12-03 NOTE — Evaluation (Signed)
Physical Therapy Assessment and Plan  Patient Details  Name: Brandon Sharp MRN: [id number] Date of Birth: [birth year]  PT Diagnosis: Difficulty walking,Muscle weakness,  Rehab Potential: Excellent ELOS: 5-7 days   Today's Date: 12/03/2014 PT Individual Time: 6811-5726 PT Individual Time Calculation (min): 90 min    Problem List:  Patient Active Problem List   Diagnosis Date Noted  . Hematoma of right thigh 12/03/2014  . Physical deconditioning 12/02/2014  . Symptomatic anemia 11/16/2014  . ESRD (end stage renal disease) on dialysis 10/20/2014  . Thrombocytopenia due to drugs 09/29/2014  . Drug-induced leukopenia 09/29/2014  . Thrombocytopenia 08/26/2014  . Cellulitis 08/25/2014  . Anemia in chronic illness 08/25/2014  . Cellulitis of left upper extremity   . End stage renal disease on dialysis   . Protein-calorie malnutrition, severe 05/06/2014  . FUO (fever of unknown origin) 05/05/2014  . S/P bronchoscopy with biopsy   . Fever   . Healthcare associated bacterial pneumonia 04/11/2014  . Immunosuppressed status   . Kidney transplant failure   . Floaters in visual field   . Fever, unknown origin 04/06/2014  . Fever of undetermined origin 04/06/2014  . Sepsis 03/30/2014  . Blood poisoning   . ESRD on dialysis   . Left lower lobe pneumonia   . Personal history of colonic polyps 12/14/2013  . HCAP (healthcare-associated pneumonia) 07/24/2013  . ESRD on hemodialysis 07/24/2013  . Diarrhea 07/24/2013  . Generalized weakness 07/24/2013  . Pain in limb- Left leg 11/24/2012  . Atherosclerosis of native arteries of the extremities with intermittent claudication 11/24/2012  . Fever of unknown origin (FUO) 04/16/2011  . HTN (hypertension), malignant 04/16/2011  . DM (diabetes mellitus), type 2 with renal complications 20/35/5974  . CKD (chronic kidney disease), stage V 04/16/2011  . DVT (deep venous thrombosis) 04/15/2011  . Coagulopathy 04/15/2011  . Heart transplanted  04/15/2011  . Renal transplant failure and rejection 04/15/2011  . Ventricular fibrillation 04/15/2011    Class: History of  . S/P CABG (coronary artery bypass graft) 04/15/2011    Class: History of  . Dyslipidemia 04/15/2011    Past Medical History:  Past Medical History  Diagnosis Date  . Diabetes mellitus   . Hypertension   . Myocardial infarction 1985; 1990  . Angina   . Dysrhythmia   . DVT (deep venous thrombosis) ~ 12/2010    LLE  . Anemia   . Blood transfusion 06/1999    post heart transplant  . Peripheral vascular disease   . Renal failure     Hemodialysis MWF last 2 years, sse dr [name] nephrology, goes to [facility]  . DVT (deep venous thrombosis) 08/25/2014    Nearly occluded R IJ, and L subclavian DVT  . CHF (congestive heart failure) 2001    beffore transplant; had AICD that was explanted 07/19/99 following successful heart transplant  . Coronary artery disease     transplant cardiologist Dr. Corky Mull [facility])  . Pneumonia    Past Surgical History:  Past Surgical History  Procedure Laterality Date  . Nephrectomy transplanted organ  2008  . Av fistula repair      rt. a=fore arm fistula  . Av fistula placement  ~ 01/2010    "this was my 2nd fistula placement"  . Heart transplant  06/1999  . Colonoscopy  03/17/2012    Procedure: COLONOSCOPY;  Surgeon: Lear Ng, MD;  Location: [facility];  Service: Endoscopy;  Laterality: N/A;  . Coronary artery bypass graft  1990  CABG X3  . Colonoscopy with propofol N/A 12/14/2013    Procedure: COLONOSCOPY WITH PROPOFOL;  Surgeon: Lear Ng, MD;  Location: [facility];  Service: Endoscopy;  Laterality: N/A;  . Video bronchoscopy Bilateral 04/14/2014    Procedure: VIDEO BRONCHOSCOPY WITH FLUORO;  Surgeon: Collene Gobble, MD;  Location: [place];  Service: Cardiopulmonary;  Laterality: Bilateral;  . Shuntogram Right 05/09/2014    Procedure: FISTULOGRAM;  Surgeon: Conrad Ovando,  MD;  Location: [facility] CATH LAB;  Service: Cardiovascular;  Laterality: Right;  . Coronary angioplasty with stent placement  1985; 1990    prior stents in the 80's and 90's then CABG; s/p heart transplant 2001 s/p BMS mid LAD and proximal RCA 04/21/13 [facility])  . Av fistula placement Right 10/20/2014    Procedure: INSERTION OF ARTERIOVENOUS (AV) GORE-TEX GRAFT THIGH;  Surgeon: Conrad Paskenta, MD;  Location: [place];  Service: Vascular;  Laterality: Right;  . Hematoma evacuation Right 11/21/2014    Procedure: EVACUATION HEMATOMA Right Groin;  Surgeon: Conrad Lazy Y U, MD;  Location: [place];  Service: Vascular;  Laterality: Right;  . Application of wound vac Right 11/21/2014    Procedure: APPLICATION OF WOUND VAC Right Groin;  Surgeon: Conrad Capulin, MD;  Location: [place];  Service: Vascular;  Laterality: Right;  . Peripheral vascular catheterization N/A 11/25/2014    Procedure: Abdominal Aortogram;  Surgeon: Elam Dutch, MD;  Location: [place] CV LAB;  Service: Cardiovascular;  Laterality: N/A;  . Lower extremity angiogram Right 11/25/2014    Procedure: Lower Extremity Angiogram;  Surgeon: Elam Dutch, MD;  Location: [place] CV LAB;  Service: Cardiovascular;  Laterality: Right;    Assessment & Plan Clinical Impression: Brandon Sharp is a 69 y.o. male with history of DM type 2, CAD, CHF s/p Cardiac transplant, ESRD who was admitted on 11/16/14 from HD center due to profound weakness. He was found to be anemic due to right thigh hematoma s/p recent AVG placement. He was transfused with 2 units PRBC and was taken to OR on 11/21/14 for evacuation of Thigh hematoma with VAC placement. He developed right foot ischemia and pain therefore arteriogram recommended due to concerns of steal syndrome. He underwent aortogram with attempts at recanalization of R-SFA stent and evidence of medium length occlusion. Patient has elected on observation for now v/s R-FPBG or ligation of RLE AVG. Therapy ongoing and CIR  recommended due to deconditioned state.     Patient currently requires min with mobility secondary to muscle weakness.  Prior to hospitalization, patient was modified independent  with mobility and lived with Spouse in a House home.  Home access is 2Stairs to enter.  Patient will benefit from skilled PT intervention to maximize safe functional mobility for planned discharge home with 24 hour supervision.  Anticipate patient will benefit from follow up [place] at discharge.  PT - End of Session Activity Tolerance: Tolerates 30+ min activity with multiple rests Endurance Deficit: Yes PT Assessment Rehab Potential (ACUTE/IP ONLY): Excellent Barriers to Discharge: Decreased caregiver support PT Patient demonstrates impairments in the following area(s): Balance;Endurance PT Transfers Functional Problem(s): Bed to Chair;Car PT Locomotion Functional Problem(s): Ambulation;Stairs PT Plan PT Intensity: Minimum of 1-2 x/day ,45 to 90 minutes PT Duration Estimated Length of Stay: 5-7 days PT Treatment/Interventions: Ambulation/gait training;Balance/vestibular training;Neuromuscular re-education;Stair training;Therapeutic Exercise;Therapeutic Activities;UE/LE Strength taining/ROM;Functional mobility training PT Transfers Anticipated Outcome(s): Mod I PT Locomotion Anticipated Outcome(s): Mod I PT Recommendation Recommendations for Other Services: Speech consult Follow Up Recommendations: Home  health PT Patient destination: Home Equipment Recommended: To be determined  Skilled Therapeutic Intervention  Assessment and evaluation performed, goals established and patient is in agreement with POC and actively participated in goal setting to assure safe return home. At the beginning of session patient in bed, no complains of pain or discomfort , agrees to therapy intervention. Bed mobility with supervision and cues for use of side bars to reduce effort in turning. Sitting EOB with good balance, supported  with B UE, resistant to sit unsupported, when dynamic activities introduced patient able to maintain balance and accept small challenges as well as reach outside BOS.  Transfer to w/c with min A for safety and to assist with wound vac and IV. Patient initiated w/c propulsion towards the gym but reports fatigue after about 20 feet. In therapy gym training in supported and unsupported standing , with min A for unsupported balance, slight backward leaning and knee buckle with standing over 2 min .Instruction and training in short distance gait with FWW 2 x 15 feet 1 x 30 feet with min A and assistance to manage vac and IV, patient presents with decreased step length, forward posture, decreased selective knee extension possibly due to weakness and decreased pelvic rotation during gait. Patient needed multiple rest breaks , two in supine to recover from activities.  Attempted to initiate training in stair maneuvering, patient requested to not do any more today as he is getting very fatigued. Upon returning to room patient requested to use the bathroom ,transfer to commode with min A,patient left in the bathroom with CNA present and educated on assistance level needed to assure safety.   Patient would benefit from skilled services to increase strength and cardiopulmonary capacity and compliance in order to safely return home.   PT Evaluation Precautions/Restrictions Precautions Precautions: Fall Precaution Comments: Wound vac on R groin  Restrictions Weight Bearing Restrictions: No General   Vital SignsTherapy Vitals Temp: 98.2 F (36.8 C) Temp Source: Oral Pulse Rate: 88 Resp: 18 BP: (!) 117/46 mmHg Patient Position (if appropriate): Sitting Oxygen Therapy SpO2: 99 % Pain Pain Assessment Pain Assessment: No/denies pain Home Living/Prior Functioning Home Living Available Help at Discharge: Available 24 hours/day Type of Home: House Home Access: Stairs to enter Technical brewer of  Steps: 2 Entrance Stairs-Rails: None Home Layout: One level  Lives With: Spouse Prior Function Level of Independence: Independent with basic ADLs;Requires assistive device for independence  Able to Take Stairs?: Reciprically Driving: Yes Vocation: Retired  Pharmacologist: Impaired Memory Impairment: Retrieval deficit Comments: delayed Marine scientist Ambulation/Gait Assistance: 4: Min assist  Trunk/Postural Assessment  Cervical Assessment Cervical Assessment:  (forward head position) Thoracic Assessment Thoracic Assessment: Within Functional Limits Lumbar Assessment Lumbar Assessment: Within Functional Limits Postural Control Postural Control: Within Functional Limits  Balance Balance Balance Assessed: Yes Dynamic Sitting Balance Dynamic Sitting - Balance Support: Bilateral upper extremity supported Dynamic Sitting - Level of Assistance: 5: Stand by assistance Dynamic Sitting - Balance Activities: Lateral lean/weight shifting;Forward lean/weight shifting;Reaching for Consulting civil engineer Standing - Balance Support: Bilateral upper extremity supported Static Standing - Level of Assistance: 4: Min assist Static Standing - Comment/# of Minutes: 1 Extremity Assessment  RUE Assessment RUE Assessment: Within Functional Limits   RLE Assessment RLE Assessment: Within Functional Limits RLE Strength RLE Overall Strength: Deficits LLE Assessment LLE Assessment: Exceptions to WFL LLE Strength LLE Overall Strength: Deficits  FIM:  FIM - Bed/Chair Transfer Bed/Chair Transfer: 5: Sit > Supine: Supervision (verbal cues/safety issues);5:  Supine > Sit: Supervision (verbal cues/safety issues);4: Chair or W/C > Bed: Min A (steadying Pt. > 75%) FIM - Locomotion: Ambulation Locomotion: Ambulation Assistive Devices: Walker - Rolling (30) Ambulation/Gait Assistance: 4: Min assist Locomotion: Ambulation: 1: Travels less than 50 ft with minimal  assistance (Pt.>75%) FIM - Locomotion: Stairs Locomotion: Stairs: 0: Activity did not occur   Refer to Care Plan for Long Term Goals  Recommendations for other services: Neuropsych  Discharge Criteria: Patient will be discharged from PT if patient refuses treatment 3 consecutive times without medical reason, if treatment goals not met, if there is a change in medical status, if patient makes no progress towards goals or if patient is discharged from hospital.  The above assessment, treatment plan, treatment alternatives and goals were discussed and mutually agreed upon: by patient  Guadlupe Spanish 12/03/2014, 4:08 PM

## 2014-12-03 NOTE — Evaluation (Signed)
Occupational Therapy Assessment and Plan  Patient Details  Name: Brandon Sharp MRN: [id number] Date of Birth: 1945-12-02  OT Diagnosis: muscle weakness (generalized) Rehab Potential: Rehab Potential (ACUTE ONLY): Good ELOS: 5-7 days   Today's Date: 12/03/2014 OT Individual Time: 1330-1430 OT Individual Time Calculation (min): 60 min     Problem List:  Patient Active Problem List   Diagnosis Date Noted  . Hematoma of right thigh 12/03/2014  . Physical deconditioning 12/02/2014  . Symptomatic anemia 11/16/2014  . ESRD (end stage renal disease) on dialysis 10/20/2014  . Thrombocytopenia due to drugs 09/29/2014  . Drug-induced leukopenia 09/29/2014  . Thrombocytopenia 08/26/2014  . Cellulitis 08/25/2014  . Anemia in chronic illness 08/25/2014  . Cellulitis of left upper extremity   . End stage renal disease on dialysis   . Protein-calorie malnutrition, severe 05/06/2014  . FUO (fever of unknown origin) 05/05/2014  . S/P bronchoscopy with biopsy   . Fever   . Healthcare associated bacterial pneumonia 04/11/2014  . Immunosuppressed status   . Kidney transplant failure   . Floaters in visual field   . Fever, unknown origin 04/06/2014  . Fever of undetermined origin 04/06/2014  . Sepsis 03/30/2014  . Blood poisoning   . ESRD on dialysis   . Left lower lobe pneumonia   . Personal history of colonic polyps 12/14/2013  . HCAP (healthcare-associated pneumonia) 07/24/2013  . ESRD on hemodialysis 07/24/2013  . Diarrhea 07/24/2013  . Generalized weakness 07/24/2013  . Pain in limb- Left leg 11/24/2012  . Atherosclerosis of native arteries of the extremities with intermittent claudication 11/24/2012  . Fever of unknown origin (FUO) 04/16/2011  . HTN (hypertension), malignant 04/16/2011  . DM (diabetes mellitus), type 2 with renal complications 35/46/5681  . CKD (chronic kidney disease), stage V 04/16/2011  . DVT (deep venous thrombosis) 04/15/2011  . Coagulopathy 04/15/2011  .  Heart transplanted 04/15/2011  . Renal transplant failure and rejection 04/15/2011  . Ventricular fibrillation 04/15/2011    Class: History of  . S/P CABG (coronary artery bypass graft) 04/15/2011    Class: History of  . Dyslipidemia 04/15/2011    Past Medical History:  Past Medical History  Diagnosis Date  . Diabetes mellitus   . Hypertension   . Myocardial infarction 1985; 1990  . Angina   . Dysrhythmia   . DVT (deep venous thrombosis) ~ 12/2010    LLE  . Anemia   . Blood transfusion 06/1999    post heart transplant  . Peripheral vascular disease   . Renal failure     Hemodialysis MWF last 2 years, sse dr [name] nephrology, goes to [place] [facility]  . DVT (deep venous thrombosis) 08/25/2014    Nearly occluded R IJ, and L subclavian DVT  . CHF (congestive heart failure) 2001    beffore transplant; had AICD that was explanted 07/19/99 following successful heart transplant  . Coronary artery disease     transplant cardiologist Dr. Corky Mull [facility])  . Pneumonia    Past Surgical History:  Past Surgical History  Procedure Laterality Date  . Nephrectomy transplanted organ  2008  . Av fistula repair      rt. a=fore arm fistula  . Av fistula placement  ~ 01/2010    "this was my 2nd fistula placement"  . Heart transplant  06/1999  . Colonoscopy  03/17/2012    Procedure: COLONOSCOPY;  Surgeon: Lear Ng, MD;  Location: [facility];  Service: Endoscopy;  Laterality: N/A;  . Coronary artery  bypass graft  1990    CABG X3  . Colonoscopy with propofol N/A 12/14/2013    Procedure: COLONOSCOPY WITH PROPOFOL;  Surgeon: Lear Ng, MD;  Location: [facility];  Service: Endoscopy;  Laterality: N/A;  . Video bronchoscopy Bilateral 04/14/2014    Procedure: VIDEO BRONCHOSCOPY WITH FLUORO;  Surgeon: Collene Gobble, MD;  Location: [place];  Service: Cardiopulmonary;  Laterality: Bilateral;  . Shuntogram Right 05/09/2014    Procedure: FISTULOGRAM;   Surgeon: Conrad Longwood, MD;  Location: [facility] CATH LAB;  Service: Cardiovascular;  Laterality: Right;  . Coronary angioplasty with stent placement  1985; 1990    prior stents in the 80's and 90's then CABG; s/p heart transplant 2001 s/p BMS mid LAD and proximal RCA 04/21/13 [facility])  . Av fistula placement Right 10/20/2014    Procedure: INSERTION OF ARTERIOVENOUS (AV) GORE-TEX GRAFT THIGH;  Surgeon: Conrad Sanilac, MD;  Location: [place];  Service: Vascular;  Laterality: Right;  . Hematoma evacuation Right 11/21/2014    Procedure: EVACUATION HEMATOMA Right Groin;  Surgeon: Conrad Luna, MD;  Location: [place];  Service: Vascular;  Laterality: Right;  . Application of wound vac Right 11/21/2014    Procedure: APPLICATION OF WOUND VAC Right Groin;  Surgeon: Conrad Woodburn, MD;  Location: [place];  Service: Vascular;  Laterality: Right;  . Peripheral vascular catheterization N/A 11/25/2014    Procedure: Abdominal Aortogram;  Surgeon: Elam Dutch, MD;  Location: [place] CV LAB;  Service: Cardiovascular;  Laterality: N/A;  . Lower extremity angiogram Right 11/25/2014    Procedure: Lower Extremity Angiogram;  Surgeon: Elam Dutch, MD;  Location: [place] CV LAB;  Service: Cardiovascular;  Laterality: Right;    Assessment & Plan Clinical Impression:   Brandon Sharp is a 69 y.o. male with history of DM type 2, CAD, CHF s/p Cardiac transplant, ESRD who was admitted on 11/16/14 from HD center due to profound weakness. He was found to be anemic due to right thigh hematoma s/p recent AVG placement. He was transfused with 2 units PRBC and was taken to OR on 11/21/14 for evacuation of  Thigh hematoma with VAC placement. He developed right foot ischemia and pain therefore arteriogram recommended due to concerns of steal syndrome. He underwent aortogram with attempts at recanalization of R-SFA stent and evidence of medium length occlusion. Patient has elected on observation for now v/s R-FPBG or ligation of  RLE AVG.  Therapy ongoing and CIR recommended due to deconditioned state.  Patient transferred to CIR on 12/02/2014 .    Patient currently requires min with transfers and S with  basic self-care skills secondary to muscle weakness, decreased cardiorespiratoy endurance and decreased standing balance.  Prior to hospitalization, patient could complete basic self care with modified independent .  Patient will benefit from skilled intervention to increase independence with basic self-care skills prior to discharge home with care partner.  Anticipate patient will require intermittent supervision and follow up home health.  OT - End of Session Activity Tolerance: Tolerates 10 - 20 min activity with multiple rests Endurance Deficit: Yes OT Assessment Rehab Potential (ACUTE ONLY): Good OT Patient demonstrates impairments in the following area(s): Balance;Cognition;Endurance OT Basic ADL's Functional Problem(s): Bathing;Dressing;Toileting OT Transfers Functional Problem(s): Toilet OT Additional Impairment(s): None OT Plan OT Intensity: Minimum of 1-2 x/day, 45 to 90 minutes OT Frequency: 5 out of 7 days OT Duration/Estimated Length of Stay: 5-7 days OT Treatment/Interventions: Balance/vestibular training;Cognitive remediation/compensation;Discharge planning;DME/adaptive equipment instruction;Functional mobility training;Patient/family education;Self  Care/advanced ADL retraining;Psychosocial support;Therapeutic Activities;Therapeutic Exercise;UE/LE Strength taining/ROM OT Self Feeding Anticipated Outcome(s): I OT Basic Self-Care Anticipated Outcome(s): mod I OT Toileting Anticipated Outcome(s): mod I OT Bathroom Transfers Anticipated Outcome(s): mod I to toilet, no shower goal yet OT Recommendation Patient destination: Home Follow Up Recommendations: Home health OT Equipment Recommended: To be determined   Skilled Therapeutic Intervention Pt seen for initial evaluation and ADL retraining of B/D at sink  level with a focus on activity tolerance and standing balance. Pt participated well, he was initially groggy and had some difficulty with the cognitive assessment of immediate recall. By the end of the session, pt much more alert and answered questions well. Fair activity tolerance and balance.  Pt demonstrated good initiation and motivation during all tasks. Tolerates standing for approximately 1 minute at a time.  Pt resting in wc at end of session with all needs met.   OT Evaluation Precautions/Restrictions  Precautions Precautions: Fall Precaution Comments: Wound vac on R groin  Restrictions Weight Bearing Restrictions: No    Vital Signs Therapy Vitals Temp: 98.2 F (36.8 C) Temp Source: Oral Pulse Rate: 88 Resp: 18 BP: (!) 117/46 mmHg Patient Position (if appropriate): Sitting Oxygen Therapy SpO2: 99 % Pain Pain Assessment Pain Assessment: No/denies pain Home Living/Prior Functioning Home Living Available Help at Discharge: Available 24 hours/day, Family Type of Home: House Home Access: Stairs to enter [employer] of Steps: 2  (Through garage, ) Entrance Stairs-Rails: None Home Layout: One level Additional Comments:  (Seat in front of the sink. 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 Patient recieved PT HH ,but has been steadily declining in the past few months. ADL ADL ADL Comments: refer to FIM Vision/Perception  Vision- History Baseline Vision/History: Wears glasses Wears Glasses: Reading only Patient Visual Report: No change from  baseline Vision- Assessment Vision Assessment?: No apparent visual deficits  Cognition Year: 2016 Month: July (2 cues to say month, kept repeating Saturday) Day of Week: Correct Memory: Impaired Memory Impairment: Retrieval deficit Immediate Memory Recall: Sock;Blue;Bed Memory Recall: Blue;Sock;Bed Memory Recall Sock: With Cue (2 cues; an article of clothing. something you wear on your foot) Memory Recall Blue: Without Cue Memory Recall Bed: With Cue (2 cues: piece of furniture, something you sleep in) Comments: delayed processing Sensation Sensation Light Touch: Appears Intact Stereognosis: Appears Intact Hot/Cold: Appears Intact Proprioception: Appears Intact Coordination Gross Motor Movements are Fluid and Coordinated: Yes Fine Motor Movements are Fluid and Coordinated: Yes Motor  Motor Motor: Within Functional Limits Mobility  Bed Mobility Bed Mobility: Supine to Sit Supine to Sit: 4: Min assist Supine to Sit Details: Verbal cues for sequencing Transfers Sit to Stand: 4: Min assist  Trunk/Postural Assessment  Cervical Assessment Cervical Assessment:  (forward head position) Thoracic Assessment Thoracic Assessment: Within Functional Limits Lumbar Assessment Lumbar Assessment: Within Functional Limits Postural Control Postural Control: Within Functional Limits  Balance Balance Balance Assessed: Yes Dynamic Sitting Balance Dynamic Sitting - Balance Support: Bilateral upper extremity supported Dynamic Sitting - Level of Assistance: 5: Stand by assistance Dynamic Sitting Balance - Compensations:  (Patient resistant to sit w/o UE support, slight tremors when unsupported sitting) Dynamic  Sitting - Balance Activities: Lateral lean/weight shifting;Forward lean/weight shifting;Reaching for Consulting civil engineer Standing - Balance Support: Bilateral upper extremity supported Static Standing - Level of Assistance: 4: Min assist Static Standing -  Comment/# of Minutes: 1 (Due to endurance deficits patient able to maintain standing for only about 45 second to 1 min ,increasing fatigue) Extremity/Trunk Assessment RUE Assessment RUE Assessment: Within Functional Limits    FIM:  FIM - Eating Eating Activity: 7: Complete independence:no helper FIM - Grooming Grooming Steps: Wash, rinse, dry face;Wash, rinse, dry hands;Oral care, brush teeth, clean dentures Grooming: 6: More than reasonable amount of time (w/c level) FIM - Bathing Bathing Steps Patient Completed: Chest;Right Arm;Left Arm;Abdomen;Front perineal area;Buttocks;Right upper leg;Left upper leg;Right lower leg (including foot);Left lower leg (including foot) Bathing: 5: Supervision: Safety issues/verbal cues FIM - Upper Body Dressing/Undressing Upper body dressing/undressing steps patient completed: Thread/unthread right sleeve of pullover shirt/dresss;Thread/unthread left sleeve of pullover shirt/dress;Put head through opening of pull over shirt/dress;Pull shirt over trunk Upper body dressing/undressing: 5: Set-up assist to: Obtain clothing/put away FIM - Lower Body Dressing/Undressing Lower body dressing/undressing steps patient completed: Thread/unthread right underwear leg;Thread/unthread left underwear leg;Pull underwear up/down;Thread/unthread right pants leg;Thread/unthread left pants leg;Pull pants up/down;Don/Doff right sock;Don/Doff left sock Lower body dressing/undressing: 5: Supervision: Safety issues/verbal cues FIM - Bed/Chair Transfer Bed/Chair Transfer: 5: Supine > Sit: Supervision (verbal cues/safety issues);4: Bed > Chair or W/C: Min A (steadying Pt. > 75%) FIM - Tub/Shower Transfers Tub/shower Transfers: 0-Activity did not occur or was simulated   Refer to Care Plan for Long Term Goals  Recommendations for other services: None  Discharge Criteria: Patient will be discharged from OT if patient refuses treatment 3 consecutive times without medical reason, if  treatment goals not met, if there is a change in medical status, if patient makes no progress towards goals or if patient is discharged from hospital.  The above assessment, treatment plan, treatment alternatives and goals were discussed and mutually agreed upon: by patient  [facility] 12/03/2014, 4:23 PM

## 2014-12-03 NOTE — Progress Notes (Signed)
[facility]ICINE [facility] RE[facility]ILITATION     PROGRESS NOTE    Subjective/Complaints: Reports constipation. Pain under fair control. Sleep was broken, partly related to bowels.   ROS: Pt denies fever, rash/itching, headache, blurred or double vision, nausea, vomiting, abdominal pain, diarrhea, chest pain, shortness of breath, palpitations, dysuria, dizziness, neck or back pain, bleeding, anxiety, or depression   Objective: Vital Signs: Blood pressure 113/48, pulse 102, temperature 98.9 F (37.2 C), temperature source Oral, resp. rate 18, weight 71.5 kg (157 lb 10.1 oz), SpO2 99 %. No results found.  Recent Labs  12/02/14 1800 12/03/14 0504  WBC 4.8 5.4  HGB 8.1* 9.8*  HCT 25.8* 30.8*  PLT 69* 80*    Recent Labs  12/02/14 0312 12/02/14 1554  NA 136 136  K 3.3* 3.3*  CL 101 100*  GLUCOSE 100* 123*  BUN 23* 27*  CREATININE 6.67* 7.42*  CALCIUM 9.2 9.8   CBG (last 3)   Recent Labs  12/02/14 1849 12/02/14 2117 12/03/14 0645  GLUCAP 81 118* 108*    Wt Readings from Last 3 Encounters:  12/03/14 71.5 kg (157 lb 10.1 oz)  12/02/14 70.1 kg (154 lb 8.7 oz)  11/08/14 74.844 kg (165 lb)    Physical Exam:  Constitutional: He is oriented to person, place, and time. He appears well-developed and well-nourished.  HENT:  Head: Normocephalic and atraumatic.  Right Ear: External ear normal.  Left Ear: External ear normal.  Eyes: Conjunctivae and EOM are normal. Pupils are equal, round, and reactive to light.  Neck: Normal range of motion.  Cardiovascular: Normal rate and normal heart sounds.  Respiratory: No respiratory distress.  GI: He exhibits no distension.  Musculoskeletal: He exhibits edema.  Right thigh vac in place in groin---good suction. Right thigh edema unchanged. Thigh tender to ROM/palpation. LUE with 1-2 +edema still present.  Neurological: He is alert and oriented to person, place, and time. He has normal reflexes. No cranial nerve  deficit.  UE: 5/5 prox to distal. RLE: 2+hf, 3 ke, 4 adf/apf, LLE: 3 hf, 3+ke, 4 adf/apf. No sensory deficits. attention/awareness seem a little up and down Skin: Skin is dry.  Psychiatric: He has a normal mood and affect. His behavior is normal.   Assessment/Plan: 1. Functional deficits secondary to debility related to right thigh hematoma which require 3+ hours per day of interdisciplinary therapy in a comprehensive inpatient rehab setting. Physiatrist is providing close team supervision and 24 hour management of active medical problems listed below. Physiatrist and rehab team continue to assess barriers to discharge/monitor patient progress toward functional and medical goals. FIM:       FIM - Toileting Toileting steps completed by patient: Performs perineal hygiene Toileting Assistive Devices: Grab bar or rail for support Toileting: 2: Max-Patient completed 1 of 3 steps  FIM - Radio producer Devices: Recruitment consultant Transfers: 3-To toilet/BSC: Mod A (lift or lower assist), 3-From toilet/BSC: Mod A (lift or lower assist)        Comprehension Comprehension Mode: Auditory Comprehension: 5-Understands basic 90% of the time/requires cueing < 10% of the time  Expression Expression Mode: Verbal Expression: 5-Expresses complex 90% of the time/cues < 10% of the time  Social Interaction Social Interaction: 6-Interacts appropriately with others with medication or extra time (anti-anxiety, antidepressant).  Problem Solving Problem Solving: 5-Solves basic 90% of the time/requires cueing < 10% of the time  Memory Memory: 4-Recognizes or recalls 75 - 89% of the time/requires cueing 10 - 24% of  the time  Medical Problem List and Plan: 1. Functional deficits secondary to R thigh hematoma, PAD with subsequent weakness/ gait instability 2. DVT Prophylaxis/Anticoagulation: Pharmaceutical: Coumadin and Heparin  -daily coags are being  checked,no bleeding complications at present 3. Pain Management: continue oxycodone and observe for pain tolerance with activity   4. Mood: LCSW to follow for evaluation and support. 5. Neuropsych: This patient is capable of making decisions on his own behalf. 6. Skin/Wound Care: Continue VAC right thigh at all times -change MWF per [place] 7. Fluids/Electrolytes/Nutrition: Monitor I/O. Check labs monday 8. DM type2 : Monitor BS ac/hs for pattern--good control at present 9. Cardiac transplant: On imuran and sirolimus.  10. ESRD: HD on MWF after therapy sessions to avoid conflict with therapy times 11. Constipation: bowel regimen. Observe for patttern---had two hard bm's overnight  -will give sorbitol today--dulcolax supp prn  LOS (Days) 1 A FACE TO FACE EVALUATION WAS PERFORMED  SWARTZ,ZACHARY T 12/03/2014 8:58 AM

## 2014-12-03 NOTE — Progress Notes (Signed)
Patient had not had a BM since 11/27/14 per report, Orders to give patient an enema. Completed at 2230 with minimal results of small hard brown ball of stool. At 2350 patient complained of a "stomachache" and wanted something else to relieve his bowel. Offered patient a fleets enema, pt agreed. Visually inspected rectal vault prior to administering fleets, with some stool noted right at the opening. Only half of the solution was given at [place] and patient was able to expel a moderate amount of brown hard stool balls. Patient reported that he felt better and was able to doze off to sleep. Cont plan of care. Leandria Thier, Dione Plover

## 2014-12-04 ENCOUNTER — Inpatient Hospital Stay (HOSPITAL_COMMUNITY): Payer: Medicare Other

## 2014-12-04 ENCOUNTER — Inpatient Hospital Stay (HOSPITAL_COMMUNITY): Payer: Medicare Other | Admitting: Occupational Therapy

## 2014-12-04 LAB — CBC
HCT: 26.9 % — ABNORMAL LOW (ref 39.0–52.0)
HEMOGLOBIN: 8.5 g/dL — AB (ref 13.0–17.0)
MCH: 29.2 pg (ref 26.0–34.0)
MCHC: 31.6 g/dL (ref 30.0–36.0)
MCV: 92.4 fL (ref 78.0–100.0)
PLATELETS: 76 10*3/uL — AB (ref 150–400)
RBC: 2.91 MIL/uL — ABNORMAL LOW (ref 4.22–5.81)
RDW: 19.5 % — ABNORMAL HIGH (ref 11.5–15.5)
WBC: 4.5 10*3/uL (ref 4.0–10.5)

## 2014-12-04 LAB — PROTIME-INR
INR: 2.39 — ABNORMAL HIGH (ref 0.00–1.49)
Prothrombin Time: 25.8 seconds — ABNORMAL HIGH (ref 11.6–15.2)

## 2014-12-04 LAB — GLUCOSE, CAPILLARY
Glucose-Capillary: 97 mg/dL (ref 65–99)
Glucose-Capillary: 109 mg/dL — ABNORMAL HIGH (ref 65–99)
Glucose-Capillary: 125 mg/dL — ABNORMAL HIGH (ref 65–99)
Glucose-Capillary: 130 mg/dL — ABNORMAL HIGH (ref 65–99)

## 2014-12-04 LAB — HEPARIN LEVEL (UNFRACTIONATED): Heparin Unfractionated: 0.47 IU/mL (ref 0.30–0.70)

## 2014-12-04 MED ORDER — SENNOSIDES-DOCUSATE SODIUM 8.6-50 MG PO TABS
1.0000 | ORAL_TABLET | Freq: Once | ORAL | Status: AC
  Administered 2014-12-04: 1 via ORAL

## 2014-12-04 MED ORDER — WARFARIN SODIUM 4 MG PO TABS
4.0000 mg | ORAL_TABLET | Freq: Once | ORAL | Status: AC
  Administered 2014-12-04: 4 mg via ORAL
  Filled 2014-12-04: qty 1

## 2014-12-04 MED ORDER — SENNOSIDES-DOCUSATE SODIUM 8.6-50 MG PO TABS
2.0000 | ORAL_TABLET | Freq: Two times a day (BID) | ORAL | Status: DC
Start: 2014-12-04 — End: 2014-12-07
  Administered 2014-12-04 – 2014-12-07 (×6): 2 via ORAL
  Filled 2014-12-04 (×7): qty 2

## 2014-12-04 NOTE — Progress Notes (Signed)
ANTICOAGULATION CONSULT NOTE - Follow Up Consult  Pharmacy Consult for Heparin + Warfarin  Indication: History of recent DVT (on Coumadin PTA since March 2016)   Allergies  Allergen Reactions  . Lisinopril Swelling    Lips and tongue swell  . Niacin And Related Other (See Comments)    unknown  . Norvasc [Amlodipine Besylate] Rash    Flushing  . Penicillins Rash    Patient Measurements: Weight: 157 lb 10.1 oz (71.5 kg)   Vital Signs: Temp: 98 F (36.7 C) (07/10 0606) Temp Source: Oral (07/10 0606) BP: 102/43 mmHg (07/10 0606) Pulse Rate: 78 (07/10 0606)  Labs:  Recent Labs  12/02/14 0312 12/02/14 1554 12/02/14 1800 12/03/14 0504 12/03/14 1040 12/03/14 1938 12/04/14 0512  HGB 7.4*  --  8.1* 9.8*  --   --  8.5*  HCT 23.8*  --  25.8* 30.8*  --   --  26.9*  PLT 54*  --  69* 80*  --   --  76*  LABPROT 19.3*  --   --  19.5*  --   --  25.8*  INR 1.62*  --   --  1.65*  --   --  2.39*  HEPARINUNFRC 0.39  --   --  0.15* 0.27* 0.37 0.47  CREATININE 6.67* 7.42*  --   --   --   --   --     Estimated Creatinine Clearance: 9.5 mL/min (by C-G formula based on Cr of 7.42).   Assessment: 69 y.o. on Coumadin PTA for hx of DVT(March 2016), s/p hematoma evacuation 6/27. 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 Patient has elected for observation at this time and no surgery is planned. HL is therapeutic (HL 0.47 << 0.37, goal of 0.3-0.7) INR is also therapeutic this AM (INR 2.39 << 1.65, goal of 2-3). Hgb 8.5 << 9.8. Thrombocytopenia noted, plts 76. No overt s/sx of bleeding noted at this time. Will plan to discontinue heparin drip now that INR therapeutic.   Goal of Therapy:  INR 2-3 Heparin level 0.3-0.7 units/ml Monitor platelets by anticoagulation protocol: Yes   Plan:  1. Discontinue heparin drip, HL labs 2. Warfarin 4 mg x 1 dose at 1800 today 3. Will continue to monitor for any signs/symptoms of bleeding and will follow up with PT/INR in the a.m.   Alycia Rossetti, PharmD, BCPS Clinical Pharmacist Pager: [phone] 12/04/2014 8:37 AM

## 2014-12-04 NOTE — Progress Notes (Signed)
Subjective:   Happy about how therapy is progressing- says will be in rehab for 2 more weeks.  Objective Filed Vitals:   12/03/14 0629 12/03/14 0939 12/03/14 1506 12/04/14 0606  BP: 113/48 116/46 117/46 102/43  Pulse: 102 103 88 78  Temp: 98.9 F (37.2 C)  98.2 F (36.8 C) 98 F (36.7 C)  TempSrc: Oral  Oral Oral  Resp: 18  18 18   Weight: 71.5 kg (157 lb 10.1 oz)     SpO2: 99% 100% 99% 100%   Physical Exam General: alert and oriented.  Pt bathing no assessment done  Dialysis Orders:  [facility]  MWF 4 hours EDW 72 - needs lower EDW at d/c - estimate 70 kg based on pre/post wts 2K 2 Ca  4 hr  3000 heparin no heparin at discharge left IJ and right fem AVGG inserted 5/26  Hectorol 4  Aranesp 200 q Wed no Fe  Assessment/Plan: 1. 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 Wound culture of groin no growth. VVS to followup 2. RLE arterial insufficiency - s/p angio / per Dr.Chen- not likely to be responsive to endovascular intervention- Plan for now is observation.  3. Anemia - hgb 8.5- down from 9.8 yesterday- cont to watch CBC and transfuse PRN- multiple etiologies- due to AVG bleed / CKD Anemia/ and immunosuppression meds- s/p transfusions on 6/22 (2U) and 7/3 (1U). On max Aranesp/getting Fe load.  4. ESRD - MWF HD - no heparin with HD since on anticoagulation. New EDW of 70 at discharge 5. Metabolic bone disease - Hectorol 4. Phos 4. Reduced ca acetate BID with meals this admission. 6. Volume/ HTN: BP stable with HD and low dose coreg 7. 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 - INR 2.39  8. Hx CAD / remote CABG then heart transplant 2001- po meds Imuran, Rapamune, Prednisone 9. Dm type 2- per primary service 10. Malnutrition - alb mid 2's - renal diet/vits/supplements 11. Thrombocytopenia - increases bleeding risk 12. Disposition - rehab for 2 more weeks per pt  Shelle Iron, NP [place] [phone] 12/04/2014,10:31 AM  LOS: 2 days   Pt seen, examined and agree w A/P as above.  Kelly Splinter MD pager [phone]    cell [phone] 12/04/2014, 4:46 PM    Additional Objective Labs: Basic Metabolic Panel:  Recent Labs Lab 11/30/14 1000 12/02/14 0312 12/02/14 1554  NA 135 136 136  K 3.9 3.3* 3.3*  CL 102 101 100*  CO2 24 28 26   GLUCOSE 118* 100* 123*  BUN 33* 23* 27*  CREATININE 8.18* 6.67* 7.42*  CALCIUM 9.1 9.2 9.8  PHOS 4.0 2.7 2.8   Liver Function Tests:  Recent Labs Lab 11/30/14 1000 12/02/14 0312 12/02/14 1554  ALBUMIN 2.4* 2.4* 2.6*   No results for input(s): LIPASE, AMYLASE in the last 168 hours. CBC:  Recent Labs Lab 12/01/14 0340 12/02/14 0312 12/02/14 1800 12/03/14 0504 12/04/14 0512  WBC 3.9* 4.2 4.8 5.4 4.5  HGB 7.9* 7.4* 8.1* 9.8* 8.5*  HCT 25.1* 23.8* 25.8* 30.8* 26.9*  MCV 93.3 93.3 93.5 91.1 92.4  PLT 54* 54* 69* 80* 76*   Blood Culture    Component Value Date/Time   SDES WOUND GROIN RIGHT 11/21/2014 1007   SDES WOUND GROIN RIGHT 11/21/2014 1007   SPECREQUEST RIGHT GROIN HEMATOMA PT ON VANCOMYCIN 11/21/2014 1007   SPECREQUEST RIGHT GROIN HEMATOMA PT ON VANCOMYCIN 11/21/2014 1007  CULT  11/21/2014 1007    NO ANAEROBES ISOLATED Performed at [place]  11/21/2014 1007    NO GROWTH 3 DAYS Performed at [facility] 11/26/2014 FINAL 11/21/2014 1007   REPTSTATUS 11/24/2014 FINAL 11/21/2014 1007    Cardiac Enzymes: No results for input(s): CKTOTAL, CKMB, CKMBINDEX, TROPONINI in the last 168 hours. CBG:  Recent Labs Lab 12/03/14 0645 12/03/14 1128 12/03/14 1651 12/03/14 2125 12/04/14 0713  GLUCAP 108* 150* 163* 134* 97   Iron Studies: No results for input(s): IRON, TIBC, TRANSFERRIN, FERRITIN in the last 72 hours. @lablastinr3 @ Studies/Results: No results found. Medications:   . atorvastatin  40 mg Oral Daily  . azaTHIOprine  75 mg Oral q  morning - 10a  . calcium acetate  667 mg Oral BID WC  . carvedilol  6.25 mg Oral QHS  . colesevelam  3,750 mg Oral Daily  . [START ON 12/07/2014] darbepoetin (ARANESP) injection - DIALYSIS  200 mcg Intravenous Q Wed-HD  . [START ON 12/05/2014] doxercalciferol  4 mcg Intravenous Q M,W,F-HD  . famotidine  10 mg Oral QHS  . feeding supplement (PRO-STAT SUGAR FREE 64)  30 mL Oral BID  . insulin aspart  0-5 Units Subcutaneous QHS  . isosorbide dinitrate  40 mg Oral 3 times per day  . multivitamin  1 tablet Oral QHS  . pantoprazole  40 mg Oral Daily  . predniSONE  5 mg Oral Q breakfast  . senna-docusate  2 tablet Oral BID  . Sirolimus  1.5 mg Oral Daily  . warfarin  4 mg Oral ONCE-1800  . Warfarin - Pharmacist Dosing Inpatient   Does not apply [phone]

## 2014-12-04 NOTE — Progress Notes (Addendum)
[place] PHYSICAL MEDICINE & REHABILITATION     PROGRESS NOTE    Subjective/Complaints: No real issues. Had bms yesterday but they were hard. No pain. Vac draining less.   ROS: Pt denies fever, rash/itching, headache, blurred or double vision, nausea, vomiting, abdominal pain, diarrhea, chest pain, shortness of breath, palpitations, dysuria, dizziness, neck or back pain, bleeding, anxiety, or depression   Objective: Vital Signs: Blood pressure 102/43, pulse 78, temperature 98 F (36.7 C), temperature source Oral, resp. rate 18, weight 71.5 kg (157 lb 10.1 oz), SpO2 100 %. No results found.  Recent Labs  12/03/14 0504 12/04/14 0512  WBC 5.4 4.5  HGB 9.8* 8.5*  HCT 30.8* 26.9*  PLT 80* 76*    Recent Labs  12/02/14 0312 12/02/14 1554  NA 136 136  K 3.3* 3.3*  CL 101 100*  GLUCOSE 100* 123*  BUN 23* 27*  CREATININE 6.67* 7.42*  CALCIUM 9.2 9.8   CBG (last 3)   Recent Labs  12/03/14 1651 12/03/14 2125 12/04/14 0713  GLUCAP 163* 134* 97    Wt Readings from Last 3 Encounters:  12/03/14 71.5 kg (157 lb 10.1 oz)  12/02/14 70.1 kg (154 lb 8.7 oz)  11/08/14 74.844 kg (165 lb)    Physical Exam:  Constitutional: He is oriented to person, place, and time. He appears well-developed and well-nourished.  HENT:  Head: Normocephalic and atraumatic.  Right Ear: External ear normal.  Left Ear: External ear normal.  Eyes: Conjunctivae and EOM are normal. Pupils are equal, round, and reactive to light.  Neck: Normal range of motion.  Cardiovascular: Normal rate and normal heart sounds.  Respiratory: No respiratory distress.  GI: He exhibits no distension.  Musculoskeletal: He exhibits edema.  Right thigh vac in place in groin---good suction. Right thigh edema unchanged. Thigh tender to ROM/palpation. LUE with 1-2 +edema still present.  Neurological: He is alert and oriented to person, place, and time. He has normal reflexes. No cranial nerve deficit.  UE: 5/5  prox to distal. RLE: 2+hf, 3 ke, 4 adf/apf, LLE: 3 hf, 3+ke, 4 adf/apf. No sensory deficits. attention/awareness seem a little up and down Skin: Skin is dry.  Psychiatric: He has a normal mood and affect. His behavior is normal.   Assessment/Plan: 1. Functional deficits secondary to debility related to right thigh hematoma which require 3+ hours per day of interdisciplinary therapy in a comprehensive inpatient rehab setting. Physiatrist is providing close team supervision and 24 hour management of active medical problems listed below. Physiatrist and rehab team continue to assess barriers to discharge/monitor patient progress toward functional and medical goals. FIM: FIM - Bathing Bathing Steps Patient Completed: Chest, Right Arm, Left Arm, Abdomen, Front perineal area, Buttocks, Right upper leg, Left upper leg, Right lower leg (including foot), Left lower leg (including foot) Bathing: 5: Supervision: Safety issues/verbal cues  FIM - Upper Body Dressing/Undressing Upper body dressing/undressing steps patient completed: Thread/unthread right sleeve of pullover shirt/dresss, Thread/unthread left sleeve of pullover shirt/dress, Put head through opening of pull over shirt/dress, Pull shirt over trunk Upper body dressing/undressing: 5: Set-up assist to: Obtain clothing/put away FIM - Lower Body Dressing/Undressing Lower body dressing/undressing steps patient completed: Thread/unthread right underwear leg, Thread/unthread left underwear leg, Pull underwear up/down, Thread/unthread right pants leg, Thread/unthread left pants leg, Pull pants up/down, Don/Doff right sock, Don/Doff left sock Lower body dressing/undressing: 5: Supervision: Safety issues/verbal cues  FIM - Toileting Toileting steps completed by patient: Performs perineal hygiene Toileting Assistive Devices: Grab bar or rail for  support Toileting: 2: Max-Patient completed 1 of 3 steps  FIM - Engineer, structural Devices: Recruitment consultant Transfers: 3-To toilet/BSC: Mod A (lift or lower assist), 3-From toilet/BSC: Mod A (lift or lower assist)  FIM - Bed/Chair Transfer Bed/Chair Transfer: 5: Supine > Sit: Supervision (verbal cues/safety issues), 4: Bed > Chair or W/C: Min A (steadying Pt. > 75%)  FIM - Locomotion: Ambulation Locomotion: Ambulation Assistive Devices: Walker - Rolling (30) Ambulation/Gait Assistance: 4: Min assist Locomotion: Ambulation: 1: Travels less than 50 ft with minimal assistance (Pt.>75%)  Comprehension Comprehension Mode: Auditory Comprehension: 5-Understands basic 90% of the time/requires cueing < 10% of the time  Expression Expression Mode: Verbal Expression: 5-Expresses complex 90% of the time/cues < 10% of the time  Social Interaction Social Interaction: 6-Interacts appropriately with others with medication or extra time (anti-anxiety, antidepressant).  Problem Solving Problem Solving: 5-Solves basic 90% of the time/requires cueing < 10% of the time  Memory Memory: 4-Recognizes or recalls 75 - 89% of the time/requires cueing 10 - 24% of the time  Medical Problem List and Plan: 1. Functional deficits secondary to R thigh hematoma, PAD with subsequent weakness/ gait instability 2. DVT rx (March 2016): coumadin therapeutic, dc heparin -serial INR's and dose adjustment--appreciate pharmacy assist 3. Pain Management: continue oxycodone and observe for pain tolerance with activity   4. Mood: LCSW to follow for evaluation and support. 5. Neuropsych: This patient is capable of making decisions on his own behalf. 6. Skin/Wound Care: Continue VAC right thigh at all times -change MWF per WOC---could this be dc'ed soon? 7. Fluids/Electrolytes/Nutrition: Monitor I/O. Labs with HD 8. DM type2 : Monitor BS ac/hs for pattern--good control at present 9. Cardiac transplant: On imuran and sirolimus.  10. ESRD: HD on MWF  after therapy sessions to avoid conflict with therapy times 11. Constipation: bowel regimen. Had 2 more bm's yesterday---both hard  -continue softener/laxative  LOS (Days) 2 A FACE TO FACE EVALUATION WAS PERFORMED  SWARTZ,ZACHARY T 12/04/2014 9:04 AM

## 2014-12-04 NOTE — Progress Notes (Signed)
Occupational Therapy Session Note  Patient Details  Name: MARIA GALLICCHIO MRN: [id number] Date of Birth: 11/02/1945  Today's Date: 12/04/2014 OT Individual Time:  - 1600-1700  (60 min)      Short Term Goals: Week 1:  OT Short Term Goal 1 (Week 1): STGs = LTGs Week 2:     Skilled Therapeutic Interventions/Progress Updates:    Walked forward and turned with RW to simulate walking around house.  Pt did with min to SBA.  Pt attempt sit to stand from 20.5 inch high wc without using arms and was unable.  Transferred to mat table and went from sit to stand at 22.5  inch high with SBA x5. Sit to stand from 22 inch high mat with SBA x5.  Pt did last sit to stand with increased force forward and needed min assist to regain balance.     Instructed pt on breathing strategies to assist with sit to stand.    Therapy Documentation Precautions:  Precautions Precautions: Fall Precaution Comments: Wound vac on R groin  Restrictions Weight Bearing Restrictions: No      Pain:  none   ADL ADL Comments: refer to FIM     See FIM for current functional status  Therapy/Group: Individual Therapy  Lisa Roca 12/04/2014, 4:40 PM

## 2014-12-04 NOTE — Progress Notes (Signed)
Physical Therapy Session Note  Patient Details  Name: Brandon Sharp MRN: [id number] Date of Birth: 05-Sep-1945  Today's Date: 12/04/2014 PT Individual Time: 1305-1405 PT Individual Time Calculation (min): 60 min   Short Term Goals: Week 1:  PT Short Term Goal 1 (Week 1): STG =LTG  Skilled Therapeutic Interventions/Progress Updates:   seated therapeutic exercises performed with LEs to increase strength for functional mobility: 10 x 1 each hip flex, knee ext with ankle pumps at end range, calf raises. W/c propulsion using bil UEs for activity tolerance, x 60' with supervision, VCs for steering, as pt tends to veer L.   Gait with RW x 56' with min guard assist. Up/down 8 - 3" high steps with bil rails, step to progressing to step through when ascending, step to when descending. Gait with RW x 40' weaving in/out of 4 cones, min guard assist.  W/c footrests lowered to accomodate pt's frame, and decrease pressure over sacrum, which pt had c/o; he reported feeling more comfortable after adjustment.  neuromuscular re-education via demo, VCs, manual cues for biomechanics of sit>< stand using bil UE s reaching forward; stand>< sit into partial squat to focus on eccentric control bil LEs. Pt tends to descend onto sacrum with limited anterior wt shift and poor eccentric control requiring mod assist at times. Attempted sit> stand without use of UEs, but pt unable; with min assist able to come to stand, stand at [facility] for 5 sets of glut sets in standing.   PT returned pt to room, and all needs placed within reach; would vac connected to power.  Wife present.    Therapy Documentation Precautions:  Precautions Precautions: Fall Precaution Comments: Wound vac on R groin  Restrictions Weight Bearing Restrictions: No  Pain: none reported    Ambulation Ambulation/Gait Assistance: 4: Min assist Wheelchair Mobility Distance: 60      See FIM for current functional status  Therapy/Group: Individual  Therapy  Melora Menon 12/04/2014, 2:14 PM

## 2014-12-05 ENCOUNTER — Inpatient Hospital Stay (HOSPITAL_COMMUNITY): Payer: Medicare Other

## 2014-12-05 DIAGNOSIS — R609 Edema, unspecified: Secondary | ICD-10-CM

## 2014-12-05 LAB — CBC
HEMATOCRIT: 25.2 % — AB (ref 39.0–52.0)
Hemoglobin: 8 g/dL — ABNORMAL LOW (ref 13.0–17.0)
MCH: 29.2 pg (ref 26.0–34.0)
MCHC: 31.7 g/dL (ref 30.0–36.0)
MCV: 92 fL (ref 78.0–100.0)
Platelets: 71 10*3/uL — ABNORMAL LOW (ref 150–400)
RBC: 2.74 MIL/uL — AB (ref 4.22–5.81)
RDW: 19.2 % — AB (ref 11.5–15.5)
WBC: 3.8 10*3/uL — ABNORMAL LOW (ref 4.0–10.5)

## 2014-12-05 LAB — RENAL FUNCTION PANEL
ALBUMIN: 3.1 g/dL — AB (ref 3.5–5.0)
Anion gap: 10 (ref 5–15)
BUN: 8 mg/dL (ref 6–20)
CALCIUM: 8.2 mg/dL — AB (ref 8.9–10.3)
CO2: 27 mmol/L (ref 22–32)
CREATININE: 3.05 mg/dL — AB (ref 0.61–1.24)
Chloride: 99 mmol/L — ABNORMAL LOW (ref 101–111)
GFR calc Af Amer: 22 mL/min — ABNORMAL LOW (ref >60)
GFR, EST NON AFRICAN AMERICAN: 19 mL/min — AB (ref >60)
GLUCOSE: 102 mg/dL — AB (ref 65–99)
PHOSPHORUS: 2 mg/dL — AB (ref 2.5–4.6)
POTASSIUM: 3.5 mmol/L (ref 3.5–5.1)
Sodium: 136 mmol/L (ref 135–145)

## 2014-12-05 LAB — GLUCOSE, CAPILLARY
GLUCOSE-CAPILLARY: 106 mg/dL — AB (ref 65–99)
GLUCOSE-CAPILLARY: 97 mg/dL (ref 65–99)
Glucose-Capillary: 101 mg/dL — ABNORMAL HIGH (ref 65–99)
Glucose-Capillary: 87 mg/dL (ref 65–99)

## 2014-12-05 LAB — PROTIME-INR
INR: 2.85 — ABNORMAL HIGH (ref 0.00–1.49)
PROTHROMBIN TIME: 29.4 s — AB (ref 11.6–15.2)

## 2014-12-05 MED ORDER — LIDOCAINE HCL (PF) 1 % IJ SOLN: 5.0000 mL | INTRAMUSCULAR | Status: DC | PRN

## 2014-12-05 MED ORDER — DOXERCALCIFEROL 4 MCG/2ML IV SOLN
INTRAVENOUS | Status: AC
  Filled 2014-12-05: qty 2

## 2014-12-05 MED ORDER — SODIUM CHLORIDE 0.9 % IV SOLN: 100.0000 mL | INTRAVENOUS | Status: DC | PRN

## 2014-12-05 MED ORDER — NEPRO/CARBSTEADY PO LIQD
237.0000 mL | ORAL | Status: DC | PRN
  Filled 2014-12-05: qty 237

## 2014-12-05 MED ORDER — WARFARIN SODIUM 4 MG PO TABS
4.0000 mg | ORAL_TABLET | Freq: Once | ORAL | Status: AC
  Administered 2014-12-05: 4 mg via ORAL
  Filled 2014-12-05: qty 1

## 2014-12-05 MED ORDER — PENTAFLUOROPROP-TETRAFLUOROETH EX AERO: 1.0000 "application " | INHALATION_SPRAY | CUTANEOUS | Status: DC | PRN

## 2014-12-05 MED ORDER — LIDOCAINE-PRILOCAINE 2.5-2.5 % EX CREA
1.0000 "application " | TOPICAL_CREAM | CUTANEOUS | Status: DC | PRN
  Filled 2014-12-05: qty 5

## 2014-12-05 MED ORDER — ALTEPLASE 2 MG IJ SOLR
2.0000 mg | Freq: Once | INTRAMUSCULAR | Status: DC | PRN
  Filled 2014-12-05: qty 2

## 2014-12-05 MED ORDER — HEPARIN SODIUM (PORCINE) 1000 UNIT/ML DIALYSIS: 1000.0000 [IU] | INTRAMUSCULAR | Status: DC | PRN

## 2014-12-05 NOTE — Progress Notes (Signed)
Physical Therapy Session Note  Patient Details  Name: Brandon Sharp MRN: [id number] Date of Birth: [birth year]  Today's Date: 12/05/2014 PT Individual Time: 0800-0905; 3567-0141 PT Individual Time Calculation (min): 65 min , 15 min  Short Term Goals: Week 1:  PT Short Term Goal 1 (Week 1): STG =LTG  Skilled Therapeutic Interventions/Progress Updates:  Tx 1:  Pt's bil hands more swollen than last session; MD aware.    W/c propulsion using bil hands x 100', then bil feet x 150' as UEs became fatigued, with supervision, and improved steering compared to last session.  Therapeutic exercises performed with LE to increase strength for functional mobility: 15 x 1 R/L seated long arc quad knee ext with ankle pumps at end range, marching, calf raises. Pt SOB and requested approx 3 min rest before able to ambulate.  Gait with RW x 95' with min guard assist, VCs for upright trunk and forward gaze.  Standing endurance at high table, x 3 minutes with LUE support at the elbow, during bil UE fine motor activity of removing lids from Rx bottles.  Tx 2: L hand edema reduced; remains moderate R hand.  Static> dynamic lateral wt shifts in standing on wedge x 2 minutes for stretch bil heel cords- gastrocs.  Sitting on mat lowered to increase passive DF for stretch -bil soleus muscles during active rest break.  Instructed pt in ankle DF with extended knees to perform in bed for HS stretch.    Gait with RW x 60' with close supervision with improved trunk and LE extension.    PT pushed pt back to room, as he was exhausted and ready to go to HD.  W/c> bed squat pivot to L with close supervision, without cues. Pt positioned for comfort; all needs left within reach.  Awaiting transport to HD.    Therapy Documentation Precautions:  Precautions Precautions: Fall Precaution Comments: Wound vac on R groin  Restrictions Weight Bearing Restrictions: No   Pain: Pain Assessment Pain Assessment: No/denies  pain       See FIM for current functional status  Therapy/Group: Individual Therapy  Dalena Plantz 12/05/2014, 5:36 PM

## 2014-12-05 NOTE — Progress Notes (Signed)
ANTICOAGULATION CONSULT NOTE - Follow Up Consult  Pharmacy Consult for Heparin + Warfarin  Indication: History of recent DVT bilat UE (on Coumadin PTA since March 2016)   Allergies  Allergen Reactions  . Lisinopril Swelling    Lips and tongue swell  . Niacin And Related Other (See Comments)    unknown  . Norvasc [Amlodipine Besylate] Rash    Flushing  . Penicillins Rash    Patient Measurements: Weight: 155 lb 11.2 oz (70.625 kg)   Vital Signs: Temp: 98 F (36.7 C) (07/11 0522) Temp Source: Oral (07/11 0522) BP: 128/58 mmHg (07/11 0522) Pulse Rate: 78 (07/11 0522)  Labs:  Recent Labs  12/02/14 1554  12/03/14 0504 12/03/14 1040 12/03/14 1938 12/04/14 0512 12/05/14 0528  HGB  --   < > 9.8*  --   --  8.5* 8.0*  HCT  --   < > 30.8*  --   --  26.9* 25.2*  PLT  --   < > 80*  --   --  76* 71*  LABPROT  --   --  19.5*  --   --  25.8* 29.4*  INR  --   --  1.65*  --   --  2.39* 2.85*  HEPARINUNFRC  --   < > 0.15* 0.27* 0.37 0.47  --   CREATININE 7.42*  --   --   --   --   --   --   < > = values in this interval not displayed.  Estimated Creatinine Clearance: 9.4 mL/min (by C-G formula based on Cr of 7.42).   Assessment: 69 y.o. on Coumadin PTA for hx of DVT bilat UE(March 2016). Admitted 11/16/14 from HD center due to profound weakness. Found to be anemic due to right thigh hematoma. He is now s/p recent AVG placements/p hematoma evacuation 6/27. 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 Per Dr.Chen- it is not likely to be responsive to endovascular intervention. Patient has elected for observation at this time and no surgery is planned. Coumadin resumed on 11/30/14.   Today the INR is 2.85, remains therapeutic. (INR 2.85 <<2.39 << 1.65, goal of 2-3). Hgb 8.0 down from 8.5 < 9.8<8.1<7.4). Thrombocytopenia noted, plts 71<< 76. No overt s/sx of bleeding noted at this time. Anemia - due to AVG bleed / CKD Anemia/ and immunosuppression meds- s/p transfusions on 6/22 (2U)  and 7/3 (1U). On max Aranesp/getting Fe load. Heparin drip discontinued yesterday since INR was therapeutic.   Coumadin PTA dose: 5mg  daily  Goal of Therapy:  INR 2-3 Heparin level 0.3-0.7 units/ml Monitor platelets by anticoagulation protocol: Yes   Plan:  1. Warfarin 4 mg x 1 dose at 1800 today 2. Will continue to monitor for any signs/symptoms of bleeding and will follow up with PT/INR in the a.m.   Nicole Cella, RPh Clinical Pharmacist Pager: [phone] 12/05/2014 1:54 PM

## 2014-12-05 NOTE — Progress Notes (Signed)
[facility] Individual Statement of Services  Patient Name:  Brandon Sharp  Date:  12/05/2014  Welcome to the [facility].  Our goal is to provide you with an individualized program based on your diagnosis and situation, designed to meet your specific needs.  With this comprehensive rehabilitation program, you will be expected to participate in at least 3 hours of rehabilitation therapies Monday-Friday, with modified therapy programming on the weekends.  Your rehabilitation program will include the following services:  Physical Therapy (PT), Occupational Therapy (OT), 24 hour per day rehabilitation nursing, Case Management (Social Worker), Rehabilitation Medicine, Nutrition Services and Pharmacy Services  Weekly team conferences will be held on Wednesdays to discuss your progress.  Your Social Worker will talk with you frequently to get your input and to update you on team discussions.  Team conferences with you and your family in attendance may also be held.  Expected length of stay:  5 to 7 days  Overall anticipated outcome:  Modified Independent with supervision for stairs  Depending on your progress and recovery, your program may change. Your Social Worker will coordinate services and will keep you informed of any changes. Your Social Worker's name and contact numbers are listed  below.  The following services may also be recommended but are not provided by the [facility] will be made to provide these services after discharge if needed.  Arrangements include referral to agencies that provide these services.  Your insurance has been verified to be:  Medicare and [place] Your primary doctor is:  Dr. Anastasia Pall  Pertinent information will be shared with your doctor and your insurance company.  Social Worker:   Alfonse Alpers, LCSW  [phone] or [phone]  Information discussed with and copy given to patient by: Trey Sailors, 12/05/2014, 1:28 PM

## 2014-12-05 NOTE — Progress Notes (Signed)
Occupational Therapy Session Note  Patient Details  Name: Brandon Sharp MRN: [id number] Date of Birth: 1945/05/29  Today's Date: 12/05/2014 OT Individual Time: 1000-1100 OT Individual Time Calculation (min): 60 min    Short Term Goals: Week 1:  OT Short Term Goal 1 (Week 1): STGs = LTGs  Skilled Therapeutic Interventions/Progress Updates:    Pt seen for 1:1 OT session with a focus on dynamic standing balance, functional ambulation, activity tolerance, and safety awareness. Pt received seated in w/c agreeable to participate in therapy. Pt propelled w/c with BUE apprx 25' before stating fatigue. Pt completed sit<>stand with SBA during therapeutic activities. Pt engaged in dynamic standing balance activity of putt-putt golf with min A-SBA, with pt requiring 3 rest breaks during activity. Pt then engaged in balance activity of horseshoe toss for apprx 15 minutes total with min A- SBA, for dynamic standing balance. Overall, pt demonstrates decreased activity tolerance, only tolerating standing for 2-3 minutes at a time. Pt fatigues during task and relies on UB support with RW as fatigue progresses. Pt completed 6 STS during session with SBA. Pt then engaged in functional ambulation via RW and min guard apprx 75' before fatigue. Pt then propelled in w/c with total A due to time constraint and increased fatigue. Pt left seated in w/c with all needs within reach.   Therapy Documentation Precautions:  Precautions Precautions: Fall Precaution Comments: Wound vac on R groin  Restrictions Weight Bearing Restrictions: No General:   Vital Signs:  Pain:   ADL: ADL ADL Comments: refer to FIM Exercises:   Other Treatments:    See FIM for current functional status  Therapy/Group: Individual Therapy  Dorann Ou 12/05/2014, 11:13 AM

## 2014-12-05 NOTE — IPOC Note (Addendum)
Overall Plan of Care [facility]) Patient Details Name: Brandon Sharp MRN: [id number] DOB: [birth year]  Admitting Diagnosis: ESRD HD  Hospital Problems: Principal Problem:   Physical deconditioning Active Problems:   Heart transplanted   HTN (hypertension), malignant   DM (diabetes mellitus), type 2 with renal complications   ESRD on dialysis   Hematoma of right thigh     Functional Problem List: Nursing Bowel, Edema, Endurance, Medication Management, Safety, Sensory, Skin Integrity  PT Balance, Endurance  OT Balance, Cognition, Endurance  SLP    TR         Basic ADL's: OT Bathing, Dressing, Toileting     Advanced  ADL's: OT       Transfers: PT Bed to Chair, Car  OT Toilet     Locomotion: PT Ambulation, Stairs     Additional Impairments: OT None  SLP        TR      Anticipated Outcomes Item Anticipated Outcome  Self Feeding I  Swallowing      Basic self-care  mod I  Toileting  mod I   Bathroom Transfers mod I to toilet, no shower goal yet  Bowel/Bladder  Manage Bowel with min assist, [place] due to ESRD  Transfers  Mod I  Locomotion  Mod I  Communication     Cognition     Pain  Pain less than 2  Safety/Judgment  Remain safe while on the Rehab unit   Therapy Plan: PT Intensity: Minimum of 1-2 x/day ,45 to 90 minutes PT Frequency: 5 out of 7 days PT Duration Estimated Length of Stay: 5-7 days OT Intensity: Minimum of 1-2 x/day, 45 to 90 minutes OT Frequency: 5 out of 7 days OT Duration/Estimated Length of Stay: 5-7 days         Team Interventions: Nursing Interventions Patient/Family Education, Bowel Management, Disease Management/Prevention, Medication Management, Skin Care/Wound Management, Discharge Planning, Psychosocial Support  PT interventions Ambulation/gait training, Balance/vestibular training, Neuromuscular re-education, Stair training, Therapeutic Exercise, Therapeutic Activities, UE/LE Strength taining/ROM, Functional mobility  training  OT Interventions Balance/vestibular training, Cognitive remediation/compensation, Discharge planning, DME/adaptive equipment instruction, Functional mobility training, Patient/family education, Self Care/advanced ADL retraining, Psychosocial support, Therapeutic Activities, Therapeutic Exercise, UE/LE Strength taining/ROM  SLP Interventions    TR Interventions    SW/CM Interventions Discharge Planning, Psychosocial Support, Patient/Family Education    Team Discharge Planning: Destination: PT-Home ,OT- Home , SLP-  Projected Follow-up: PT-Home health PT, OT-  Home health OT, SLP-  Projected Equipment Needs: PT-To be determined, OT- To be determined, SLP-  Equipment Details: PT- , OT-  Patient/family involved in discharge planning: PT- Patient,  OT-Patient, SLP-   MD ELOS: 7-11d Medical Rehab Prognosis:  Excellent Assessment: 69 y.o. male with history of DM type 2, CAD, CHF s/p Cardiac transplant, ESRD who was admitted on 11/16/14 from HD center due to profound weakness. He was found to be anemic due to right thigh hematoma s/p recent AVG placement. He was transfused with 2 units PRBC and was taken to OR on 11/21/14 for evacuation of Thigh hematoma with VAC placement. He developed right foot ischemia and pain therefore arteriogram recommended due to concerns of steal syndrome. He underwent aortogram with attempts at recanalization of R-SFA stent and evidence of medium length occlusion. Patient has elected on observation for now v/s R-FPBG or ligation of RLE AVG   Now requiring 24/7 Rehab RN,MD, as well as CIR level PT, OT and SLP.  Treatment team will focus on ADLs and mobility with goals  set at Mod I  See Team Conference Notes for weekly updates to the plan of care

## 2014-12-05 NOTE — Progress Notes (Signed)
Subjective:   Reports swelling in both arms and legs  Objective Filed Vitals:   12/03/14 1506 12/04/14 0606 12/04/14 1500 12/05/14 0522  BP: 117/46 102/43 132/48 128/58  Pulse: 88 78 81 78  Temp: 98.2 F (36.8 C) 98 F (36.7 C) 98.3 F (36.8 C) 98 F (36.7 C)  TempSrc: Oral Oral Oral Oral  Resp: 18 18 18 18   Weight:    70.625 kg (155 lb 11.2 oz)  SpO2: 99% 100% 100% 98%   Physical Exam Alert, no distress No jvd Chest clear bilat to bases RRR no RG Abd soft ntnd +bs R thigh AVG +bruit, wound vac in place L IJ cath Neuro is alert, nf, ox 3   NW  MWF 4 hours 72kg  2/2 bath  4hr Heparin 3000 (no hep at dc, on coumadin) Hectorol 4  Aranesp 200 q Wed no Fe  Assessment: 1. 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 Wound culture of groin no growth. VVS to followup 2. Vol excess - diffuse edema, under dry wt but has lost sig body wt 3. ESRD - MWF HD - no heparin with HD since on anticoagulation 4. RLE arterial insufficiency - s/p angio / per Dr.Chen- not likely to be responsive to endovascular intervention- Plan for now is observation 5. Anemia - due to AVG bleed / CKD Anemia/ and immunosuppression meds- s/p transfusions on 6/22 (2U) and 7/3 (1U). On max Aranesp/getting Fe load 6. HTN on coreg 6.25 qhs and tid Isordil 40mg .   7. Metabolic bone disease - Hectorol 4. Phos 4. Reduced ca acetate BID with meals this admission 8. 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 - INR 2.39  9. Hx CAD / remote CABG / heart transplant 2001- po meds Imuran, Rapamune, Prednisone 10. Dm type 2- per primary service 11. Malnutrition - alb mid 2's - renal diet/vits/supplements 12. Thrombocytopenia - increases bleeding risk 13. Disposition - rehab for 2 more weeks per pt  Plan: HD today max UF, get vol down to new lower dry wt   Kelly Splinter MD pager [phone]    cell [phone] 12/05/2014, 9:16 AM    Additional  Objective Labs: Basic Metabolic Panel:  Recent Labs Lab 11/30/14 1000 12/02/14 0312 12/02/14 1554  NA 135 136 136  K 3.9 3.3* 3.3*  CL 102 101 100*  CO2 24 28 26   GLUCOSE 118* 100* 123*  BUN 33* 23* 27*  CREATININE 8.18* 6.67* 7.42*  CALCIUM 9.1 9.2 9.8  PHOS 4.0 2.7 2.8   Liver Function Tests:  Recent Labs Lab 11/30/14 1000 12/02/14 0312 12/02/14 1554  ALBUMIN 2.4* 2.4* 2.6*   No results for input(s): LIPASE, AMYLASE in the last 168 hours. CBC:  Recent Labs Lab 12/02/14 0312 12/02/14 1800 12/03/14 0504 12/04/14 0512 12/05/14 0528  WBC 4.2 4.8 5.4 4.5 3.8*  HGB 7.4* 8.1* 9.8* 8.5* 8.0*  HCT 23.8* 25.8* 30.8* 26.9* 25.2*  MCV 93.3 93.5 91.1 92.4 92.0  PLT 54* 69* 80* 76* 71*   Blood Culture    Component Value Date/Time   SDES WOUND GROIN RIGHT 11/21/2014 1007   SDES WOUND GROIN RIGHT 11/21/2014 1007   SPECREQUEST RIGHT GROIN HEMATOMA PT ON VANCOMYCIN 11/21/2014 1007   SPECREQUEST RIGHT GROIN HEMATOMA PT ON VANCOMYCIN 11/21/2014 1007   CULT  11/21/2014 1007    NO ANAEROBES ISOLATED Performed at [place]  11/21/2014 1007  NO GROWTH 3 DAYS Performed at [place] 11/26/2014 FINAL 11/21/2014 1007   REPTSTATUS 11/24/2014 FINAL 11/21/2014 1007    Cardiac Enzymes: No results for input(s): CKTOTAL, CKMB, CKMBINDEX, TROPONINI in the last 168 hours. CBG:  Recent Labs Lab 12/04/14 0713 12/04/14 1150 12/04/14 1709 12/04/14 2202 12/05/14 0637  GLUCAP 97 109* 125* 130* 101*   Iron Studies: No results for input(s): IRON, TIBC, TRANSFERRIN, FERRITIN in the last 72 hours. @lablastinr3 @ Studies/Results: No results found. Medications:   . atorvastatin  40 mg Oral Daily  . azaTHIOprine  75 mg Oral q morning - 10a  . calcium acetate  667 mg Oral BID WC  . carvedilol  6.25 mg Oral QHS  . colesevelam  3,750 mg Oral Daily  . [START ON 12/07/2014] darbepoetin (ARANESP) injection - DIALYSIS  200 mcg Intravenous Q  Wed-HD  . doxercalciferol  4 mcg Intravenous Q M,W,F-HD  . famotidine  10 mg Oral QHS  . feeding supplement (PRO-STAT SUGAR FREE 64)  30 mL Oral BID  . insulin aspart  0-5 Units Subcutaneous QHS  . isosorbide dinitrate  40 mg Oral 3 times per day  . multivitamin  1 tablet Oral QHS  . pantoprazole  40 mg Oral Daily  . predniSONE  5 mg Oral Q breakfast  . senna-docusate  2 tablet Oral BID  . Sirolimus  1.5 mg Oral Daily  . Warfarin - Pharmacist Dosing Inpatient   Does not apply [phone]

## 2014-12-05 NOTE — Progress Notes (Signed)
Social Work Assessment and Plan  Patient Details  Name: Brandon Sharp MRN: [id number] Date of Birth: 1945-09-29  Today's Date: 12/05/2014  Problem List:  Patient Active Problem List   Diagnosis Date Noted  . Hematoma of right thigh 12/03/2014  . Physical deconditioning 12/02/2014  . Symptomatic anemia 11/16/2014  . ESRD (end stage renal disease) on dialysis 10/20/2014  . Thrombocytopenia due to drugs 09/29/2014  . Drug-induced leukopenia 09/29/2014  . Thrombocytopenia 08/26/2014  . Cellulitis 08/25/2014  . Anemia in chronic illness 08/25/2014  . Cellulitis of left upper extremity   . End stage renal disease on dialysis   . Protein-calorie malnutrition, severe 05/06/2014  . FUO (fever of unknown origin) 05/05/2014  . S/P bronchoscopy with biopsy   . Fever   . Healthcare associated bacterial pneumonia 04/11/2014  . Immunosuppressed status   . Kidney transplant failure   . Floaters in visual field   . Fever, unknown origin 04/06/2014  . Fever of undetermined origin 04/06/2014  . Sepsis 03/30/2014  . Blood poisoning   . ESRD on dialysis   . Left lower lobe pneumonia   . Personal history of colonic polyps 12/14/2013  . HCAP (healthcare-associated pneumonia) 07/24/2013  . ESRD on hemodialysis 07/24/2013  . Diarrhea 07/24/2013  . Generalized weakness 07/24/2013  . Pain in limb- Left leg 11/24/2012  . Atherosclerosis of native arteries of the extremities with intermittent claudication 11/24/2012  . Fever of unknown origin (FUO) 04/16/2011  . HTN (hypertension), malignant 04/16/2011  . DM (diabetes mellitus), type 2 with renal complications 64/40/3474  . CKD (chronic kidney disease), stage V 04/16/2011  . DVT (deep venous thrombosis) 04/15/2011  . Coagulopathy 04/15/2011  . Heart transplanted 04/15/2011  . Renal transplant failure and rejection 04/15/2011  . Ventricular fibrillation 04/15/2011    Class: History of  . S/P CABG (coronary artery bypass graft) 04/15/2011   Class: History of  . Dyslipidemia 04/15/2011   Past Medical History:  Past Medical History  Diagnosis Date  . Diabetes mellitus   . Hypertension   . Myocardial infarction 1985; 1990  . Angina   . Dysrhythmia   . DVT (deep venous thrombosis) ~ 12/2010    LLE  . Anemia   . Blood transfusion 06/1999    post heart transplant  . Peripheral vascular disease   . Renal failure     Hemodialysis MWF last 2 years, sse dr [name] nephrology, goes to [place] [facility]  . DVT (deep venous thrombosis) 08/25/2014    Nearly occluded R IJ, and L subclavian DVT  . CHF (congestive heart failure) 2001    beffore transplant; had AICD that was explanted 07/19/99 following successful heart transplant  . Coronary artery disease     transplant cardiologist [facility])  . Pneumonia    Past Surgical History:  Past Surgical History  Procedure Laterality Date  . Nephrectomy transplanted organ  2008  . Av fistula repair      rt. a=fore arm fistula  . Av fistula placement  ~ 01/2010    "this was my 2nd fistula placement"  . Heart transplant  06/1999  . Colonoscopy  03/17/2012    Procedure: COLONOSCOPY;  Surgeon: Lear Ng, MD;  Location: [facility];  Service: Endoscopy;  Laterality: N/A;  . Coronary artery bypass graft  1990    CABG X3  . Colonoscopy with propofol N/A 12/14/2013    Procedure: COLONOSCOPY WITH PROPOFOL;  Surgeon: Lear Ng, MD;  Location: [facility];  Service: Endoscopy;  Laterality: N/A;  . Video bronchoscopy Bilateral 04/14/2014    Procedure: VIDEO BRONCHOSCOPY WITH FLUORO;  Surgeon: Collene Gobble, MD;  Location: [place];  Service: Cardiopulmonary;  Laterality: Bilateral;  . Shuntogram Right 05/09/2014    Procedure: FISTULOGRAM;  Surgeon: Conrad Cle Elum, MD;  Location: [facility] CATH LAB;  Service: Cardiovascular;  Laterality: Right;  . Coronary angioplasty with stent placement  1985; 1990    prior stents in the 80's and 90's then CABG; s/p  heart transplant 2001 s/p BMS mid LAD and proximal RCA 04/21/13 [facility])  . Av fistula placement Right 10/20/2014    Procedure: INSERTION OF ARTERIOVENOUS (AV) GORE-TEX GRAFT THIGH;  Surgeon: Conrad De Graff, MD;  Location: [place];  Service: Vascular;  Laterality: Right;  . Hematoma evacuation Right 11/21/2014    Procedure: EVACUATION HEMATOMA Right Groin;  Surgeon: Conrad East Alton, MD;  Location: [place];  Service: Vascular;  Laterality: Right;  . Application of wound vac Right 11/21/2014    Procedure: APPLICATION OF WOUND VAC Right Groin;  Surgeon: Conrad Tennille, MD;  Location: [place];  Service: Vascular;  Laterality: Right;  . Peripheral vascular catheterization N/A 11/25/2014    Procedure: Abdominal Aortogram;  Surgeon: Elam Dutch, MD;  Location: [place] CV LAB;  Service: Cardiovascular;  Laterality: N/A;  . Lower extremity angiogram Right 11/25/2014    Procedure: Lower Extremity Angiogram;  Surgeon: Elam Dutch, MD;  Location: [place] CV LAB;  Service: Cardiovascular;  Laterality: Right;   Social History:  reports that he quit smoking about 26 years ago. His smoking use included Cigarettes. He has a 20 pack-year smoking history. He has never used smokeless tobacco. He reports that he drinks alcohol. He reports that he does not use illicit drugs.  Family / Support Systems Marital Status: Married How Long?: 56 years Patient Roles: Spouse, Parent, Other (Comment) (retired Environmental education officer) Spouse/Significant Other: [name] - wife - [phone] (h)/ [phone] (m) Other Supports: children Anticipated Caregiver: wife Ability/Limitations of Caregiver: she has back issues but can provide supervision Caregiver Availability: 24/7 Family Dynamics: supportive wife  Social History Preferred language: English Religion: Non-Denominational Education: high school Read: Yes Write: Yes Employment Status: Retired Date Retired/Disabled/Unemployed: 2001 Age Retired: 53 Lexicographer Issues: none reported Guardian/Conservator: N/A   Abuse/Neglect Physical Abuse: Denies Verbal Abuse: Denies Sexual Abuse: Denies Exploitation of patient/patient's resources: Denies Self-Neglect: Denies  Emotional Status Pt's affect, behavior and adjustment status: upbeat, positive, encouraged about his recovery and rehab Recent Psychosocial Issues: none reported Psychiatric History: none reported Substance Abuse History: none reported  Patient / Family Perceptions, Expectations & Goals Pt/Family understanding of illness & functional limitations: Pt reports a good understanding of his condition and that his questions have been answered.  He was questioning why he has so many wound VAC boxes in his room and CSW told him those may be the supplies he will need at home.  RN, Ed, to discuss this further with pt. Premorbid pt/family roles/activities: Pt has become more deconditioned in the last 2 years and he has not been up to doing much. Anticipated changes in roles/activities/participation: Pt would like to be more active. Pt/family expectations/goals: Pt would like to improve his body's function and to gain more confidence in his abilities.  Community Resources [facility]: Other (Comment) [facility] - MWF dialysis) Premorbid Home Care/DME Agencies: Other (Comment) (Pt had [place] in the past and has a cane.  Pt was set up  with [facility] for wound VAC while on acute.) Transportation available at discharge: wife  Discharge Planning Living Arrangements: Spouse/significant other Support Systems: Children Type of Residence: Private residence Insurance Resources: [employer], Multimedia programmer (specify) Web designer) Financial Resources: Radio broadcast assistant Screen Referred: No Money Management: Patient, Spouse Does the patient have any problems obtaining your medications?: No Home Management: Pt's wife has taken care of home.  She has some back problems, but  can manage. Patient/Family Preliminary Plans: Pt to return home at Mod I level with wife there for supervision. Barriers to Discharge: Steps (2 steps without rails to get into the house) Social Work Anticipated Follow Up Needs: HH/OP Expected length of stay: 5 to 7 days  Clinical Impression CSW met with pt and left a message for pt's wife to introduce self and role of CSW, as well as to complete assessment.  Pt was talkative with CSW and is feeling well emotionally.  Pt has been through so many medical experiences, that he just deals with things as they come now - stating "that's life."  He reports a positive outlook and is encouraged by progress he's already made in the short time he's been working with therapies.  Pt's wife can provide supervision, but not hands on assistance due to back pain.  Pt has three grandchildren and he and his wife have 5 children between the two of them from previous relationships.  Pt was appreciative of CSW involvement, but his only need currently was to find out why he still have boxes for the wound VAC in his room.  CSW called the [place], [facility] and it seems these supplies were delivered when it was thought he was going home and not to rehab.  Ed, RN, to explain this to pt.  CSW confirmed that all necessary paperwork was turned in to [facility] and it was and they just want to be notified of pt's d/c.  [facility] is also following pt for home RN, PT, OT.  CSW will continue to follow and assist as needed.  Riona Lahti, Silvestre Mesi 12/05/2014, 2:28 PM

## 2014-12-05 NOTE — Progress Notes (Signed)
69 y.o. male with history of DM type 2, CAD, CHF s/p Cardiac transplant, ESRD who was admitted on 11/16/14 from HD center due to profound weakness. He was found to be anemic due to right thigh hematoma s/p recent AVG placement. He was transfused with 2 units PRBC and was taken to OR on 11/21/14 for evacuation of Thigh hematoma with VAC placement. He developed right foot ischemia and pain therefore arteriogram recommended due to concerns of steal syndrome. He underwent aortogram with attempts at recanalization of R-SFA stent and evidence of medium length occlusion. Patient has elected on observation for now v/s R-FPBG or ligation of RLE AVG  [place] [facility]YSICAL MEDICINE & REHABILITATION     PROGRESS NOTE    Subjective/Complaints: Increased swelling in arms  Pt denies UE pain, no trauma, denies IVs, no numbness or tingling, per PT looks increased compared to yesterday Hx of bilateral AVG in UEs  ROS: Pt denies , nausea, vomiting, abdominal pain, diarrhea, chest pain, shortness of breath, palpitations, neck or back pain, bleeding,    Objective: Vital Signs: Blood pressure 128/58, pulse 78, temperature 98 F (36.7 C), temperature source Oral, resp. rate 18, weight 70.625 kg (155 lb 11.2 oz), SpO2 98 %. No results found.  Recent Labs  12/04/14 0512 12/05/14 0528  WBC 4.5 3.8*  HGB 8.5* 8.0*  HCT 26.9* 25.2*  PLT 76* 71*    Recent Labs  12/02/14 1554  NA 136  K 3.3*  CL 100*  GLUCOSE 123*  BUN 27*  CREATININE 7.42*  CALCIUM 9.8   CBG (last 3)   Recent Labs  12/04/14 1709 12/04/14 2202 12/05/14 0637  GLUCAP 125* 130* 101*    Wt Readings from Last 3 Encounters:  12/05/14 70.625 kg (155 lb 11.2 oz)  12/02/14 70.1 kg (154 lb 8.7 oz)  11/08/14 74.844 kg (165 lb)    Physical Exam:  Constitutional: He is oriented to person, place, and time. He appears well-developed and well-nourished.  HENT:  Head: Normocephalic and atraumatic.  Right Ear: External ear normal.   Left Ear: External ear normal.  Eyes: Conjunctivae and EOM are normal. Pupils are equal, round, and reactive to light.  Neck: Normal range of motion.  Cardiovascular: Normal rate and normal heart sounds.  Respiratory: No respiratory distress.  GI: He exhibits no distension.  Musculoskeletal: He exhibits edema.  Right thigh vac in place in groin---good suction. Right thigh edema unchanged. Thigh tender to ROM/palpation.RUE with 2+ and  LUE with 1+ +edema still present. 1+ pretib and 2+ pedal edema, no calf, arm or forearm pain Neurological: He is alert and oriented to person, place, and time. He has normal reflexes. No cranial nerve deficit.  UE: 5/5 prox to distal. RLE: 2+hf, 3 ke, 4 adf/apf, LLE: 3 hf, 3+ke, 4 adf/apf. No sensory deficits. attention/awareness seem a little up and down Skin: Skin is dry.  Psychiatric: He has a normal mood and affect. His behavior is normal.   Assessment/Plan: 1. Functional deficits secondary to debility related to right thigh hematoma which require 3+ hours per day of interdisciplinary therapy in a comprehensive inpatient rehab setting. Physiatrist is providing close team supervision and 24 hour management of active medical problems listed below. Physiatrist and rehab team continue to assess barriers to discharge/monitor patient progress toward functional and medical goals.  UE edema, likely multifactorial with hx AVG, also has diffuse peripheral edema which should improve after HD today If UE edema progresses or becomes painful consider DVU FIM: FIM - Bathing  Bathing Steps Patient Completed: Chest, Right Arm, Left Arm, Abdomen, Front perineal area, Buttocks, Right upper leg, Left upper leg, Right lower leg (including foot), Left lower leg (including foot) Bathing: 5: Supervision: Safety issues/verbal cues  FIM - Upper Body Dressing/Undressing Upper body dressing/undressing steps patient completed: Thread/unthread right sleeve of pullover  shirt/dresss, Thread/unthread left sleeve of pullover shirt/dress, Put head through opening of pull over shirt/dress, Pull shirt over trunk Upper body dressing/undressing: 5: Set-up assist to: Obtain clothing/put away FIM - Lower Body Dressing/Undressing Lower body dressing/undressing steps patient completed: Thread/unthread right underwear leg, Thread/unthread left underwear leg, Pull underwear up/down, Thread/unthread right pants leg, Thread/unthread left pants leg, Pull pants up/down, Don/Doff right sock, Don/Doff left sock Lower body dressing/undressing: 5: Supervision: Safety issues/verbal cues  FIM - Toileting Toileting steps completed by patient: Performs perineal hygiene Toileting Assistive Devices: Grab bar or rail for support Toileting: 2: Max-Patient completed 1 of 3 steps  FIM - Radio producer Devices: Recruitment consultant Transfers: 3-To toilet/BSC: Mod A (lift or lower assist), 3-From toilet/BSC: Mod A (lift or lower assist)  FIM - IT sales professional Transfer: 4: Bed > Chair or W/C: Min A (steadying Pt. > 75%), 3: Chair or W/C > Bed: Mod A (lift or lower assist)  FIM - Locomotion: Wheelchair Distance: 60 Locomotion: Wheelchair: 2: Travels 50 - 149 ft with supervision, cueing or coaxing FIM - Locomotion: Ambulation Locomotion: Ambulation Assistive Devices: Administrator Ambulation/Gait Assistance: 4: Min assist Locomotion: Ambulation: 2: Travels 50 - 149 ft with minimal assistance (Pt.>75%)  Comprehension Comprehension Mode: Auditory Comprehension: 5-Understands complex 90% of the time/Cues < 10% of the time  Expression Expression Mode: Verbal Expression: 5-Expresses complex 90% of the time/cues < 10% of the time  Social Interaction Social Interaction: 6-Interacts appropriately with others with medication or extra time (anti-anxiety, antidepressant).  Problem Solving Problem Solving: 5-Solves basic 90% of the time/requires  cueing < 10% of the time  Memory Memory: 4-Recognizes or recalls 75 - 89% of the time/requires cueing 10 - 24% of the time  Medical Problem List and Plan: 1. Functional deficits secondary to R thigh hematoma, PAD with subsequent weakness/ gait instability 2. DVT rx (March 2016): coumadin therapeutic, dc heparin -serial INR's and dose adjustment--appreciate pharmacy assist 3. Pain Management: continue oxycodone and observe for pain tolerance with activity   4. Mood: LCSW to follow for evaluation and support. 5. Neuropsych: This patient is capable of making decisions on his own behalf. 6. Skin/Wound Care: Continue VAC right thigh at all times -change MWF per [place] 7. Fluids/Electrolytes/Nutrition: Monitor I/O. Labs with HD 8. DM type2 : Monitor BS ac/hs for pattern--good control at present 9. Cardiac transplant: On imuran and sirolimus.  10. ESRD: HD on MWF after therapy sessions to avoid conflict with therapy times 11. Constipation: bowel regimen. 2 BMs yesterday  -continue softener/laxative  LOS (Days) 3 A FACE TO FACE EVALUATION WAS PERFORMED  Margaux Engen E 12/05/2014 8:14 AM

## 2014-12-05 NOTE — Progress Notes (Addendum)
Physical Therapy Session Note  Patient Details  Name: Brandon Sharp MRN: [id number] Date of Birth: [birth year]  Today's Date: 12/05/2014 PT Individual Time: 1300-1400 PT Individual Time Calculation (min): 60 min   Short Term Goals: Week 1:  PT Short Term Goal 1 (Week 1): STG =LTG  Skilled Therapeutic Interventions/Progress Updates:    Pt received in w/c with no c/o pain and agreeable to treatment. Pt seen for 1:1 therapy session with focus on LE strength and functional mobility. Performed stand pivot transfer w/c <> bed with CGA. Performed supine/sidelying LE strengthening exercises including straight leg raise, heel slides, bridging, hip abduction (sidelying), LAQ. O2 monitored during exercise performance due to occasional shortness of breath, however remained 98-100% sp O2 and pt reporting his "muscles get tired". Performed gait training in hallway with minA/CGA and RW for 3 trials of approximately 75 ft each. Pt requires extended seated rest break due to fatigue between each trial, also requires mod cues/encouragement to perform next trial. Occasional cueing required during gait to keep RW closer to body with upright posture. Pt remained seated in w/c in therapy gym for handoff to therapist for next session.   Therapy Documentation Precautions:  Precautions Precautions: Fall Precaution Comments: Wound vac on R groin  Restrictions Weight Bearing Restrictions: No Pain: Pain Assessment Pain Assessment: No/denies pain Pain Score: 0-No pain   See FIM for current functional status  Therapy/Group: Individual Therapy  Luberta Mutter 12/05/2014, 1:27 PM

## 2014-12-05 NOTE — Progress Notes (Signed)
Patient information reviewed and entered into eRehab system by Ysabel Cowgill, RN, CRRN, PPS Coordinator.  Information including medical coding and functional independence measure will be reviewed and updated through discharge.     Per nursing patient was given "Data Collection Information Summary for Patients in Inpatient Rehabilitation Facilities with attached "Privacy Act Statement-Health Care Records" upon admission.  

## 2014-12-06 ENCOUNTER — Inpatient Hospital Stay (HOSPITAL_COMMUNITY): Payer: Medicare Other | Admitting: Occupational Therapy

## 2014-12-06 ENCOUNTER — Inpatient Hospital Stay (HOSPITAL_COMMUNITY): Payer: Medicare Other | Admitting: *Deleted

## 2014-12-06 ENCOUNTER — Inpatient Hospital Stay (HOSPITAL_COMMUNITY): Payer: Medicare Other

## 2014-12-06 LAB — CBC
HCT: 28.9 % — ABNORMAL LOW (ref 39.0–52.0)
Hemoglobin: 9 g/dL — ABNORMAL LOW (ref 13.0–17.0)
MCH: 28.9 pg (ref 26.0–34.0)
MCHC: 31.1 g/dL (ref 30.0–36.0)
MCV: 92.9 fL (ref 78.0–100.0)
PLATELETS: 82 10*3/uL — AB (ref 150–400)
RBC: 3.11 MIL/uL — ABNORMAL LOW (ref 4.22–5.81)
RDW: 19.1 % — ABNORMAL HIGH (ref 11.5–15.5)
WBC: 3.9 10*3/uL — ABNORMAL LOW (ref 4.0–10.5)

## 2014-12-06 LAB — GLUCOSE, CAPILLARY
GLUCOSE-CAPILLARY: 124 mg/dL — AB (ref 65–99)
Glucose-Capillary: 82 mg/dL (ref 65–99)
Glucose-Capillary: 139 mg/dL — ABNORMAL HIGH (ref 65–99)
Glucose-Capillary: 115 mg/dL — ABNORMAL HIGH (ref 65–99)

## 2014-12-06 LAB — PROTIME-INR
INR: 2.42 — ABNORMAL HIGH (ref 0.00–1.49)
PROTHROMBIN TIME: 26.1 s — AB (ref 11.6–15.2)

## 2014-12-06 MED ORDER — WARFARIN SODIUM 5 MG PO TABS
5.0000 mg | ORAL_TABLET | Freq: Once | ORAL | Status: AC
Start: 2014-12-06 — End: 2014-12-06
  Administered 2014-12-06: 5 mg via ORAL
  Filled 2014-12-06: qty 1

## 2014-12-06 MED ORDER — BOOST / RESOURCE BREEZE PO LIQD
1.0000 | Freq: Two times a day (BID) | ORAL | Status: DC
  Administered 2014-12-06 – 2014-12-09 (×6): 1 via ORAL

## 2014-12-06 NOTE — Progress Notes (Signed)
Occupational Therapy Session Note  Patient Details  Name: SPIKE DESILETS MRN: [id number] Date of Birth: 02/24/1946  Today's Date: 12/06/2014 OT Individual Time: 5883-2549 and 1330-1430 OT Individual Time Calculation (min): 45 min and 60 min   Short Term Goals: Week 1:  OT Short Term Goal 1 (Week 1): STGs = LTGs  Skilled Therapeutic Interventions/Progress Updates:    1) Engaged in ADL retraining with focus on functional transfers and activity tolerance.  Pt upright in w/c upon arrival eating breakfast.  Assessed BP as had been low overnight. Vitals below.  Pt reports feeling okay, just tired and willing to attempt treatment session.  Ambulated to room bathroom with RW and close supervision with therapist managing wound vac, min cues for RW safety and to keep it closer with ambulation.  Pt required rest break and attempted toileting as felt the need to have a BM.  Pt demonstrated improved standing tolerance and balance with ability to complete clothing management up and down with distant supervision.  UB dressing completed with setup assist.  Pt requested to terminate session early due to still feeling tired and low BP.  Will follow as able.  2) Engaged in therapeutic activity with focus on functional ambulation with RW and SPC as well as overall activity tolerance.  In therapy gym engaged in obstacle course with negotiating thresholds and narrow turns between cones with RW.  Pt required min assist with obstacle course and min cues for RW safety as well as technique to improve negotiating thresholds.  Engaged in ambulation with SPC in straight line with pt ambulating 40 feet x2 with prolonged rest break between two attempts.  Pt demonstrates improved posture and increased safety when ambulating with SPC.  Engaged in [place] for 8 mins on resistance level 4 with pt requiring cues for speed as initially taking 16-20 steps/minute, progressing to 40 steps per minute.  Pt required prolonged rest break post  Nustep, then ambulated 20 feet with SPC back to w/c.  Left in w/c in room with wife present and all needs in reach.  Therapy Documentation Precautions:  Precautions Precautions: Fall Precaution Comments: Wound vac on R groin  Restrictions Weight Bearing Restrictions: No General: General OT Amount of Missed Time: 15 Minutes Vital Signs: Therapy Vitals Pulse Rate: 83 BP: (!) 103/43 mmHg Patient Position (if appropriate): Sitting Pain: Pain Assessment Pain Assessment: No/denies pain ADL: ADL ADL Comments: refer to FIM  See FIM for current functional status  Therapy/Group: Individual Therapy  Simonne Come 12/06/2014, 11:07 AM

## 2014-12-06 NOTE — Progress Notes (Addendum)
ANTICOAGULATION CONSULT NOTE - Follow Up Consult  Pharmacy Consult for Heparin + Warfarin  Indication: History of recent DVT bilat UE (on Coumadin PTA since March 2016)   Allergies  Allergen Reactions  . Lisinopril Swelling    Lips and tongue swell  . Niacin And Related Other (See Comments)    unknown  . Norvasc [Amlodipine Besylate] Rash    Flushing  . Penicillins Rash    Patient Measurements: Weight: 151 lb (68.493 kg)   Vital Signs: Temp: 98.3 F (36.8 C) (07/12 0531) Temp Source: Oral (07/12 0531) BP: 103/43 mmHg (07/12 0740) Pulse Rate: 83 (07/12 0740)  Labs:  Recent Labs  12/03/14 1938  12/04/14 0512 12/05/14 0528 12/05/14 1937 12/06/14 0648  HGB  --   < > 8.5* 8.0*  --  9.0*  HCT  --   --  26.9* 25.2*  --  28.9*  PLT  --   --  76* 71*  --  82*  LABPROT  --   --  25.8* 29.4*  --  26.1*  INR  --   --  2.39* 2.85*  --  2.42*  HEPARINUNFRC 0.37  --  0.47  --   --   --   CREATININE  --   --   --   --  3.05*  --   < > = values in this interval not displayed.  Estimated Creatinine Clearance: 22.1 mL/min (by C-G formula based on Cr of 3.05).   Assessment: 69 y.o with history of CAD, CHF, ischemic cardiomyopathy s/p MI x 2 in 1990s, cardiac transplant 2001, ESRD, DM2. The patient is on Coumadin PTA for hx of DVT bilat UE(March 2016). Admitted 11/16/14 from HD center due to profound weakness. Found to be anemic due to right thigh hematoma. He is now s/p recent AVG placements/p hematoma evacuation 6/27. 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 Per Dr.Chen- it is not likely to be responsive to endovascular intervention. Patient has elected for observation at this time and no surgery is planned. Coumadin resumed on 11/30/14. IV heparin bridge discontinued on 12/04/14.   INR remains therapeutic today at 2.42. INR trend 2.42 < 2.85<< 2.39 << 1.65. Hgb stable 9.0< 8.0<< 8.5 << 9.8<8.1<7.4; plts 82<71< 76 <<80. Anemia - due to AVG bleed / CKD anemia/ and  immunosuppression meds- s/p transfusions on 6/22 (2U) , 7/3 (1U) and 7/8 (1U) . On max Aranesp/getting Fe load. CBC stable.No overt bleeding noted. Eating 80-100%meals last >48 hrs  Coumadin PTA dose: 5mg  daily   Goal of Therapy:  INR 2-3 Heparin level 0.3-0.7 units/ml (heparin dc'd 12/04/14) Monitor platelets by anticoagulation protocol: Yes   Plan:  1. Warfarin 5 mg x 1 dose at 1800 today 2. Will continue to monitor for any signs/symptoms of bleeding and will follow up with PT/INR in the a.m.   Nicole Cella, RPh Clinical Pharmacist Pager: [phone] 12/06/2014 2:03 PM

## 2014-12-06 NOTE — Progress Notes (Signed)
Recreational Therapy Session Note  Patient Details  Name: Brandon Sharp MRN: [id number] Date of Birth: 05-06-1946 Today's Date: 12/06/2014  Order received & chart reviewed.  Met with pt to discuss TR services and complete leisure screen.  Discussion included activity modifications, energy conservation & negotiating obstacles during community pursuits.  Full eval deferred, will monitor for community reintegration through team. Vencil Basnett 12/06/2014, 3:42 PM

## 2014-12-06 NOTE — Progress Notes (Signed)
Initial Nutrition Assessment  DOCUMENTATION CODES:  Non-severe (moderate) malnutrition in context of chronic illness  INTERVENTION:  Resource Breeze po BID, each supplement provides 250 kcal and 9 grams of protein.  Continue 30 ml Prostat po BID, each supplement provides 100 kcal and 15 grams of protein.   Provide nourishment snacks (sandwiches). Ordered.  Encourage adequate PO intake.   NUTRITION DIAGNOSIS:  Increased nutrient needs related to wound healing as evidenced by estimated needs.  GOAL:  Patient will meet greater than or equal to 90% of their needs  MONITOR:  PO intake, Supplement acceptance, Weight trends, Labs, I & O's  REASON FOR ASSESSMENT:  Malnutrition Screening Tool    ASSESSMENT:  69 y.o. male with history of DM type 2, CAD, CHF s/p Cardiac transplant, ESRD who was admitted on 11/16/14 from HD center due to profound weakness. He was found to be anemic due to right thigh hematoma s/p recent AVG placement. He was transfused with 2 units PRBC and was taken to OR on 11/21/14 for evacuation of Thigh hematoma with VAC placement. He developed right foot ischemia and pain therefore arteriogram recommended due to concerns of steal syndrome. He underwent aortogram with attempts at recanalization of R-SFA stent and evidence of medium length occlusion.  Pt familiar to this RD due to recent acute hospital admission. Pt reports appetite has been improving. Meal completion has been 100%. Weight has been stable. Pt currently has Prostat ordered and has been consuming them most of the time. RD to continue with orders. Noted, Resource Breeze supplement in room. Pt reports liking the taste of them when compared to the "milkshake" supplements. RD to order. Pt additionally requested nourishment snacks (sandwich) for additional protein. RD to order. Pt was encouraged to eat his food at meals and to drink his supplements.   Nutrition-Focused physical exam completed. Findings are  moderate fat depletion, moderate to severe muscle depletion, and mild edema.   Labs: Low chloride, calcium, phosphorous, and GFR. High creatinine.   Diet Order:  Diet renal/carb modified with fluid restriction Diet-HS Snack?: Nothing; Room service appropriate?: Yes; Fluid consistency:: Thin; Fluid restriction:: 1200 mL Fluid  Skin:  Wound (see comment) (Stage II pressure ulcer on sacrum, Incision on R groin, incision on R upper arm, wounc VAC on R groin, non-pitting LE, RUE edema)  Last BM:  7/9  Height:  Ht Readings from Last 1 Encounters:  11/16/14 6' (1.829 m)    Weight:  Wt Readings from Last 1 Encounters:  12/06/14 151 lb (68.493 kg)    Ideal Body Weight:  81 kg  Wt Readings from Last 10 Encounters:  12/06/14 151 lb (68.493 kg)  12/02/14 154 lb 8.7 oz (70.1 kg)  11/08/14 165 lb (74.844 kg)  10/20/14 159 lb (72.122 kg)  10/11/14 161 lb (73.029 kg)  09/29/14 161 lb 9.6 oz (73.301 kg)  08/29/14 158 lb 15.2 oz (72.1 kg)  06/21/14 159 lb (72.122 kg)  05/11/14 154 lb 1.6 oz (69.9 kg)  05/03/14 158 lb 8 oz (71.895 kg)    BMI:  Body mass index is 20.47 kg/(m^2).  Estimated Nutritional Needs:  Kcal:  2100-2300  Protein:  105-120 grams  Fluid:  1.2 L/day  EDUCATION NEEDS:  No education needs identified at this time  Corrin Parker, MS, RD, LDN Pager # [phone] After hours/ weekend pager # [phone]

## 2014-12-06 NOTE — Progress Notes (Signed)
[facility] PHYSICAL MEDICINE & REHABILITATION     PROGRESS NOTE    Subjective/Complaints: Foot swelling  No foot pain, has support hose at home.  I offered to order some, but he would rather wife bring it in No Breathing isses History of ischemic cardiomyopathy s/p MI x 2 in 1990s, cardiac transplant 2001 Appreciate Nephro note  ROS: Pt denies , nausea, vomiting, abdominal pain, diarrhea, chest pain, shortness of breath, palpitations, neck or back pain, bleeding,    Objective: Vital Signs: Blood pressure 103/43, pulse 83, temperature 98.3 F (36.8 C), temperature source Oral, resp. rate 18, weight 68.493 kg (151 lb), SpO2 100 %. No results found.  Recent Labs  12/05/14 0528 12/06/14 0648  WBC 3.8* 3.9*  HGB 8.0* 9.0*  HCT 25.2* 28.9*  PLT 71* 82*    Recent Labs  12/05/14 1937  NA 136  K 3.5  CL 99*  GLUCOSE 102*  BUN 8  CREATININE 3.05*  CALCIUM 8.2*   CBG (last 3)   Recent Labs  12/05/14 1948 12/05/14 2138 12/06/14 0638  GLUCAP 87 97 82    Wt Readings from Last 3 Encounters:  12/06/14 68.493 kg (151 lb)  12/02/14 70.1 kg (154 lb 8.7 oz)  11/08/14 74.844 kg (165 lb)    Physical Exam:  Constitutional: He is oriented to person, place, and time. He appears well-developed and well-nourished.  HENT:  Head: Normocephalic and atraumatic.  Right Ear: External ear normal.  Left Ear: External ear normal.  Eyes: Conjunctivae and EOM are normal. Pupils are equal, round, and reactive to light.  Neck: Normal range of motion.  Cardiovascular: Normal rate and normal heart sounds.  Respiratory: No respiratory distress.  GI: He exhibits no distension.  Musculoskeletal: He exhibits edema.  Right thigh vac in place in groin---good suction. Right thigh edema unchanged. Thigh tender to ROM/palpation.RUE with 2+ and  LUE with 1+ +edema still present. 1+ pretib and 2+ pedal edema, no calf, arm or forearm pain Neurological: He is alert and oriented to  person, place, and time. He has normal reflexes. No cranial nerve deficit.  UE: 5/5 prox to distal. RLE: 2+hf, 3 ke, 4 adf/apf, LLE: 3 hf, 3+ke, 4 adf/apf. No sensory deficits. attention/awareness seem a little up and down Skin: Skin is dry.  Psychiatric: He has a normal mood and affect. His behavior is normal.   Assessment/Plan: 1. Functional deficits secondary to debility related to right thigh hematoma which require 3+ hours per day of interdisciplinary therapy in a comprehensive inpatient rehab setting. Physiatrist is providing close team supervision and 24 hour management of active medical problems listed below. Physiatrist and rehab team continue to assess barriers to discharge/monitor patient progress toward functional and medical goals.  Would benefit from support hose, has some at home which he will ask wife to bring in  FIM: FIM - Bathing Bathing Steps Patient Completed: Chest, Right Arm, Left Arm, Abdomen, Front perineal area, Buttocks, Right upper leg, Left upper leg, Right lower leg (including foot), Left lower leg (including foot) Bathing: 5: Supervision: Safety issues/verbal cues  FIM - Upper Body Dressing/Undressing Upper body dressing/undressing steps patient completed: Thread/unthread right sleeve of pullover shirt/dresss, Thread/unthread left sleeve of pullover shirt/dress, Put head through opening of pull over shirt/dress, Pull shirt over trunk Upper body dressing/undressing: 5: Set-up assist to: Obtain clothing/put away FIM - Lower Body Dressing/Undressing Lower body dressing/undressing steps patient completed: Thread/unthread right underwear leg, Thread/unthread left underwear leg, Pull underwear up/down, Thread/unthread right pants leg, Thread/unthread  left pants leg, Pull pants up/down, Don/Doff right sock, Don/Doff left sock Lower body dressing/undressing: 5: Supervision: Safety issues/verbal cues  FIM - Toileting Toileting steps completed by patient: Performs  perineal hygiene Toileting Assistive Devices: Grab bar or rail for support Toileting: 2: Max-Patient completed 1 of 3 steps  FIM - Radio producer Devices: Recruitment consultant Transfers: 3-To toilet/BSC: Mod A (lift or lower assist), 3-From toilet/BSC: Mod A (lift or lower assist)  FIM - Control and instrumentation engineer Devices: Copy: 5: Supine > Sit: Supervision (verbal cues/safety issues), 5: Sit > Supine: Supervision (verbal cues/safety issues), 4: Bed > Chair or W/C: Min A (steadying Pt. > 75%), 4: Chair or W/C > Bed: Min A (steadying Pt. > 75%)  FIM - Locomotion: Wheelchair Distance: 60 Locomotion: Wheelchair: 2: Travels 50 - 149 ft with supervision, cueing or coaxing FIM - Locomotion: Ambulation Locomotion: Ambulation Assistive Devices: Administrator Ambulation/Gait Assistance: 4: Min assist Locomotion: Ambulation: 2: Travels 50 - 149 ft with minimal assistance (Pt.>75%)  Comprehension Comprehension Mode: Auditory Comprehension: 5-Understands complex 90% of the time/Cues < 10% of the time  Expression Expression Mode: Verbal Expression: 5-Expresses complex 90% of the time/cues < 10% of the time  Social Interaction Social Interaction: 6-Interacts appropriately with others with medication or extra time (anti-anxiety, antidepressant).  Problem Solving Problem Solving: 5-Solves basic 90% of the time/requires cueing < 10% of the time  Memory Memory: 4-Recognizes or recalls 75 - 89% of the time/requires cueing 10 - 24% of the time  Medical Problem List and Plan: 1. Functional deficits secondary to R thigh hematoma, PAD with subsequent weakness/ gait instability 2. DVT rx (March 2016): coumadin therapeutic, dc heparin -serial INR's and dose adjustment--appreciate pharmacy assist 3. Pain Management: continue oxycodone and observe for pain tolerance with activity   4. Mood: LCSW to  follow for evaluation and support. 5. Neuropsych: This patient is capable of making decisions on his own behalf. 6. Skin/Wound Care: Continue VAC right thigh at all times -change MWF per [place] 7. Fluids/Electrolytes/Nutrition: Monitor I/O. Labs with HD 8. DM type2 : Monitor BS ac/hs for pattern--good control at present 9. Cardiac transplant: On imuran and sirolimus.  10. ESRD: HD on MWF after therapy sessions to avoid conflict with therapy times 11. Constipation: bowel regimen. 2 BMs yesterday  -continue softener/laxative  LOS (Days) 4 A FACE TO FACE EVALUATION WAS PERFORMED  Charlett Blake 12/06/2014 8:19 AM

## 2014-12-06 NOTE — Progress Notes (Signed)
Occupational Therapy Session Note  Patient Details  Name: Brandon Sharp MRN: [id number] Date of Birth: February 16, 1946  Today's Date: 12/06/2014 OT Individual Time: 1510-1545 OT Individual Time Calculation (min): 35 min    Short Term Goals: Week 1:  OT Short Term Goal 1 (Week 1): STGs = LTGs  Skilled Therapeutic Interventions/Progress Updates:    Pt seen for 1:1 OT session with a focus on functional mobility, UE strengthening, functional transfers, and activity tolerance. Pt received seated in w/c with wife present and agreeable to therapy session. Therapist propelled w/c outside due to limited time during session. Pt completed UE strengthening exercises seated in w/c via theraband (green) and #3 dowel bar: bicep curls and chest pulls. Pt completed 2 sets of 10 per exercise, requiring several rest breaks due to decreased endurance. Pt then engaged in functional ambulation 25' via RW on brick walkway with min guard and w/c follow. Pt expressed mod anxiety with ambulation on outside surface but completed successfully. Pt then wheeled upstairs back to room. Pt completed transfer w/c>bed via modified stand-pivot with supervision. Pt left supine in bed with all needs within reach.   Therapy Documentation Precautions:  Precautions Precautions: Fall Precaution Comments: Wound vac on R groin  Restrictions Weight Bearing Restrictions: No General:   Vital Signs:  Pain:   ADL: ADL ADL Comments: refer to FIM Exercises:   Other Treatments:    See FIM for current functional status  Therapy/Group: Individual Therapy  Dorann Ou 12/06/2014, 3:45 PM

## 2014-12-06 NOTE — Progress Notes (Signed)
Subjective:   Got only 2.5 kg off yest, bp dropped and pt was symptomatic with lightheadness.   Objective Filed Vitals:   12/05/14 1934 12/05/14 2242 12/06/14 0531 12/06/14 0740  BP: 111/51 138/54 103/37 103/43  Pulse: 85 82 87 83  Temp: 97.6 F (36.4 C)  98.3 F (36.8 C)   TempSrc: Oral  Oral   Resp: 18  18   Weight:   68.493 kg (151 lb)   SpO2: 100%  100%    Physical Exam Alert, no distress No jvd Chest clear bilat to bases RRR no RG Abd soft ntnd +bs R thigh AVG +bruit, wound vac in place 1-2+ edema of arms and legs L IJ cath Neuro is alert, nf, ox 3   NW  MWF 4 hours 72kg  2/2 bath  4hr Heparin 3000 (no hep at dc, on coumadin) Hectorol 4  Aranesp 200 q Wed no Fe  Assessment: 1. ESRD - MWF HD - no heparin with HD since on anticoagulation 2. Vol excess - diffuse edema, cannot pull a lot of fluids d/t hypotension/ low alb   3. Dehisced R thigh AVG wound w hematoma , s/p hematoma evacuation + wound VAC placement 6/27. Off abx, cx's neg. 4. RLE arterial insufficiency - s/p angio / per Dr.Chen- not likely to be responsive to endovascular intervention- Plan for now is observation 5. Anemia - due to AVG bleed / CKD Anemia/ and immunosuppression meds- s/p transfusions on 6/22 (2U) and 7/3 (1U). On max Aranesp/getting Fe load 6. HTN on coreg 6.25 qhs and tid Isordil 40mg .   7. Metabolic bone disease - Hectorol 4. Phos 4. Reduced ca acetate BID with meals this admission 8. 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 - INR 2.39  9. Hx CAD / remote CABG / heart transplant 2001- po meds Imuran, Rapamune, Prednisone 10. Dm type 2- per primary service 11. Malnutrition - alb mid 2's - renal diet/vits/supplements 12. Thrombocytopenia - increases bleeding risk 13. Disposition - rehab for 2 more weeks per pt  Plan: HD Wed, max UF of 2.5 kg, use albumin prn   Kelly Splinter MD pager [phone]    cell [phone] 12/06/2014, 10:10 AM    Additional Objective Labs: Basic  Metabolic Panel:  Recent Labs Lab 12/02/14 0312 12/02/14 1554 12/05/14 1937  NA 136 136 136  K 3.3* 3.3* 3.5  CL 101 100* 99*  CO2 28 26 27   GLUCOSE 100* 123* 102*  BUN 23* 27* 8  CREATININE 6.67* 7.42* 3.05*  CALCIUM 9.2 9.8 8.2*  PHOS 2.7 2.8 2.0*   Liver Function Tests:  Recent Labs Lab 12/02/14 0312 12/02/14 1554 12/05/14 1937  ALBUMIN 2.4* 2.6* 3.1*   No results for input(s): LIPASE, AMYLASE in the last 168 hours. CBC:  Recent Labs Lab 12/02/14 1800 12/03/14 0504 12/04/14 0512 12/05/14 0528 12/06/14 0648  WBC 4.8 5.4 4.5 3.8* 3.9*  HGB 8.1* 9.8* 8.5* 8.0* 9.0*  HCT 25.8* 30.8* 26.9* 25.2* 28.9*  MCV 93.5 91.1 92.4 92.0 92.9  PLT 69* 80* 76* 71* 82*   Blood Culture    Component Value Date/Time   SDES WOUND GROIN RIGHT 11/21/2014 1007   SDES WOUND GROIN RIGHT 11/21/2014 1007   SPECREQUEST RIGHT GROIN HEMATOMA PT ON VANCOMYCIN 11/21/2014 1007   SPECREQUEST RIGHT GROIN HEMATOMA PT ON VANCOMYCIN 11/21/2014 1007   CULT  11/21/2014 1007    NO ANAEROBES ISOLATED Performed at [place]  11/21/2014 1007    NO  GROWTH 3 DAYS Performed at [facility] [place] 11/26/2014 FINAL 11/21/2014 1007   REPTSTATUS 11/24/2014 FINAL 11/21/2014 1007    Cardiac Enzymes: No results for input(s): CKTOTAL, CKMB, CKMBINDEX, TROPONINI in the last 168 hours. CBG:  Recent Labs Lab 12/05/14 0637 12/05/14 1149 12/05/14 1948 12/05/14 2138 12/06/14 0638  GLUCAP 101* 106* 87 97 82   Iron Studies: No results for input(s): IRON, TIBC, TRANSFERRIN, FERRITIN in the last 72 hours. @lablastinr3 @ Studies/Results: No results found. Medications:   . atorvastatin  40 mg Oral Daily  . azaTHIOprine  75 mg Oral q morning - 10a  . calcium acetate  667 mg Oral BID WC  . carvedilol  6.25 mg Oral QHS  . colesevelam  3,750 mg Oral Daily  . [START ON 12/07/2014] darbepoetin (ARANESP) injection - DIALYSIS  200 mcg Intravenous Q Wed-HD  . doxercalciferol   4 mcg Intravenous Q M,W,F-HD  . famotidine  10 mg Oral QHS  . feeding supplement (PRO-STAT SUGAR FREE 64)  30 mL Oral BID  . insulin aspart  0-5 Units Subcutaneous QHS  . isosorbide dinitrate  40 mg Oral 3 times per day  . multivitamin  1 tablet Oral QHS  . pantoprazole  40 mg Oral Daily  . predniSONE  5 mg Oral Q breakfast  . senna-docusate  2 tablet Oral BID  . Sirolimus  1.5 mg Oral Daily  . Warfarin - Pharmacist Dosing Inpatient   Does not apply [phone]

## 2014-12-06 NOTE — Progress Notes (Signed)
Physical Therapy Session Note  Patient Details  Name: Brandon Sharp MRN: [id number] Date of Birth: May 26, 1946  Today's Date: 12/06/2014 PT Individual Time: 0830-0930 PT Individual Time Calculation (min): 60 min   Short Term Goals: Week 1:  PT Short Term Goal 1 (Week 1): STG =LTG  Skilled Therapeutic Interventions/Progress Updates:    Patient seen for 1:1 session focus on strength, gait and endurance.  Patient in wheelchair propelled to therapy gym 200' supervision with several rest stops initially solely with UE's, then with UE/LE's due to UE fatigue.  Patient sit to stand supervision consistently throughout session.  Performed standing LE therex with UE support at counter with 2# weights on ankles to include hamstring curls, hip extension, hip abduction, minisquats and marching with several seated rest breaks 5-10 reps each.  Seated LAQ x 10, ankle DF, PF 20 each and hip abduction with green theraband around knees x 10 w/ 3 sec hold.  Performed standing steps ups to 6" step with bilateral UE support on rails 2 x 4 reps min assist.  Patient ambulated w/ RW minguard to supervision x 70, 50, 60, 50, 40 and 80' over level tile and carpeted surfaces with seated rest breaks in between. One episode LOB over threshold with walker catching due to heavy wound vac on front min assist to recover.  Cues for posture, proximity to walker, head up and adjusted walker to height.  Patient cued to reach back for arms of chair to sit due to performing stand to sit with uncontrolled descent.  Patient propelled back to room supervision x 120' and left in chair all needs in reach.  Therapy Documentation Precautions:  Precautions Precautions: Fall Precaution Comments: Wound vac on R groin  Restrictions Weight Bearing Restrictions: No Pain: Pain Assessment Pain Assessment: No/denies pain Locomotion : Ambulation Ambulation/Gait Assistance: 4: Min assist Wheelchair Mobility Distance: 150      See FIM for  current functional status  Therapy/Group: Individual Therapy  [place], [place] 12/06/2014  12/06/2014, 10:43 AM

## 2014-12-07 ENCOUNTER — Inpatient Hospital Stay (HOSPITAL_COMMUNITY): Payer: Medicare Other

## 2014-12-07 ENCOUNTER — Inpatient Hospital Stay (HOSPITAL_COMMUNITY): Payer: Medicare Other | Admitting: Occupational Therapy

## 2014-12-07 DIAGNOSIS — K5901 Slow transit constipation: Secondary | ICD-10-CM

## 2014-12-07 LAB — RENAL FUNCTION PANEL
Albumin: 2.8 g/dL — ABNORMAL LOW (ref 3.5–5.0)
Anion gap: 9 (ref 5–15)
BUN: 29 mg/dL — ABNORMAL HIGH (ref 6–20)
CO2: 25 mmol/L (ref 22–32)
Calcium: 9.3 mg/dL (ref 8.9–10.3)
Chloride: 100 mmol/L — ABNORMAL LOW (ref 101–111)
Creatinine, Ser: 7.37 mg/dL — ABNORMAL HIGH (ref 0.61–1.24)
GFR calc non Af Amer: 7 mL/min — ABNORMAL LOW (ref >60)
GFR, EST AFRICAN AMERICAN: 8 mL/min — AB (ref >60)
Glucose, Bld: 100 mg/dL — ABNORMAL HIGH (ref 65–99)
POTASSIUM: 3.8 mmol/L (ref 3.5–5.1)
Phosphorus: 3.8 mg/dL (ref 2.5–4.6)
Sodium: 134 mmol/L — ABNORMAL LOW (ref 135–145)

## 2014-12-07 LAB — PROTIME-INR
INR: 2.89 — ABNORMAL HIGH (ref 0.00–1.49)
PROTHROMBIN TIME: 29.8 s — AB (ref 11.6–15.2)

## 2014-12-07 LAB — GLUCOSE, CAPILLARY
GLUCOSE-CAPILLARY: 102 mg/dL — AB (ref 65–99)
GLUCOSE-CAPILLARY: 84 mg/dL (ref 65–99)
Glucose-Capillary: 103 mg/dL — ABNORMAL HIGH (ref 65–99)
Glucose-Capillary: 89 mg/dL (ref 65–99)

## 2014-12-07 MED ORDER — WARFARIN SODIUM 4 MG PO TABS
4.0000 mg | ORAL_TABLET | Freq: Once | ORAL | Status: DC
  Filled 2014-12-07: qty 1

## 2014-12-07 MED ORDER — DARBEPOETIN ALFA 200 MCG/0.4ML IJ SOSY
PREFILLED_SYRINGE | INTRAMUSCULAR | Status: AC
  Filled 2014-12-07: qty 0.4

## 2014-12-07 MED ORDER — SENNOSIDES-DOCUSATE SODIUM 8.6-50 MG PO TABS
3.0000 | ORAL_TABLET | Freq: Two times a day (BID) | ORAL | Status: DC
  Administered 2014-12-07 – 2014-12-13 (×12): 3 via ORAL
  Filled 2014-12-07 (×12): qty 3

## 2014-12-07 MED ORDER — WARFARIN SODIUM 5 MG PO TABS
5.0000 mg | ORAL_TABLET | Freq: Once | ORAL | Status: DC
  Filled 2014-12-07: qty 1

## 2014-12-07 MED ORDER — DOXERCALCIFEROL 4 MCG/2ML IV SOLN
INTRAVENOUS | Status: AC
  Filled 2014-12-07: qty 2

## 2014-12-07 NOTE — Progress Notes (Signed)
Physical Therapy Session Note  Patient Details  Name: Brandon Sharp MRN: [id number] Date of Birth: [birth year]  Today's Date: 12/07/2014 PT Individual Time: 0900-1015 PT Individual Time Calculation (min): 75 min   Short Term Goals: Week 1:  PT Short Term Goal 1 (Week 1): STG =LTG Week 2:     Skilled Therapeutic Interventions/Progress Updates:   tx focused on activity tolerance, strengthening/flexibility, gait, simulated car transfer  W/c propulsion with bil UEs or bil LEs x 170' for activity tolerance, distant supervision.  Gait with RW x 68' with supervision on level tile, x 25' on carpet and level tile with SPC in R hand. Standing endurance x 2 minutes during bil hand activity placing clothing on 2 hangers and transporting hangers in L hand while ambulating with SPC in R hand.  Pt stands during functional activity with L or R forearm support.  Pt stated he has done this for 2-3 years. Gait x 20' x 2 to/from simulated car with SPC, close supervision> min guard assist, mod cues for upright posture, forward gaze.  Standing activity for flexibility and endurance: on wedge for heel cord stretch x 1 minute x 2 , before needing to sit; sitting with feet on wedge for soleus stretch x 1 minute.  Simulated car transfer to sedan ht seat, close supervision, min cues for safe hand placement, using SPC.  PT pushed pt back to room due to fatigue.  Pt left resting in w/c with all needs within reach, and wound vac plugged in.    Therapy Documentation Precautions:  Precautions Precautions: Fall Precaution Comments: Wound vac on R groin  Restrictions Weight Bearing Restrictions: No General:   Pain: Pain Assessment Pain Assessment: No/denies pain   Locomotion : Ambulation Ambulation/Gait Assistance: 4: Min guard Wheelchair Mobility Distance: 175      See FIM for current functional status  Therapy/Group: Individual Therapy  Anthonella Klausner 12/07/2014, 10:27 AM

## 2014-12-07 NOTE — Progress Notes (Signed)
[facility] PHYSICAL MEDICINE & REHABILITATION     PROGRESS NOTE    Subjective/Complaints: CC:  weakness  Appreciate Nephro note- no D/C date set yet, team conf today  ROS: Pt denies , nausea, vomiting,diarrhea, chest pain, shortness of breath, palpitations, neck or back pain,    Objective: Vital Signs: Blood pressure 114/56, pulse 84, temperature 97.8 F (36.6 C), temperature source Oral, resp. rate 18, weight 68.221 kg (150 lb 6.4 oz), SpO2 100 %. No results found.  Recent Labs  12/05/14 0528 12/06/14 0648  WBC 3.8* 3.9*  HGB 8.0* 9.0*  HCT 25.2* 28.9*  PLT 71* 82*    Recent Labs  12/05/14 1937  NA 136  K 3.5  CL 99*  GLUCOSE 102*  BUN 8  CREATININE 3.05*  CALCIUM 8.2*   CBG (last 3)   Recent Labs  12/06/14 1648 12/06/14 2047 12/07/14 0634  GLUCAP 115* 124* 103*    Wt Readings from Last 3 Encounters:  12/07/14 68.221 kg (150 lb 6.4 oz)  12/02/14 70.1 kg (154 lb 8.7 oz)  11/08/14 74.844 kg (165 lb)    Physical Exam:  Constitutional: He is oriented to person, place, and time. He appears well-developed and well-nourished.  HENT:  Head: Normocephalic and atraumatic.  Right Ear: External ear normal.  Left Ear: External ear normal.  Eyes: Conjunctivae and EOM are normal. Pupils are equal, round, and reactive to light.  Neck: Normal range of motion.  Cardiovascular: Normal rate and normal heart sounds.  Respiratory: No respiratory distress.  GI: He exhibits no distension.  Musculoskeletal: He exhibits edema.  Thigh tender to ROM/palpation.RUE with 2+ and  LUE with 1+ +edema still present. 1+ pretib and 2+ pedal edema, no calf, arm or forearm pain Neurological: He is alert and oriented to person, place, and time. He has normal reflexes. No cranial nerve deficit.  UE: 5/5 prox to distal. RLE: 2+hf, 3 ke, 4 adf/apf, LLE: 3 hf, 3+ke, 4 adf/apf. No sensory deficits. attention/awareness improving      Skin: Skin is dry.  Psychiatric: He has a  normal mood and affect. His behavior is normal.   Assessment/Plan: 1. Functional deficits secondary to debility related to right thigh hematoma which require 3+ hours per day of interdisciplinary therapy in a comprehensive inpatient rehab setting. Physiatrist is providing close team supervision and 24 hour management of active medical problems listed below. Physiatrist and rehab team continue to assess barriers to discharge/monitor patient progress toward functional and medical goals.  Team conference today please see physician documentation under team conference tab, met with team face-to-face to discuss problems,progress, and goals. Formulized individual treatment plan based on medical history, underlying problem and comorbidities.  FIM: FIM - Bathing Bathing Steps Patient Completed: Chest, Right Arm, Left Arm, Abdomen, Front perineal area, Buttocks, Right upper leg, Left upper leg, Right lower leg (including foot), Left lower leg (including foot) Bathing: 5: Supervision: Safety issues/verbal cues  FIM - Upper Body Dressing/Undressing Upper body dressing/undressing steps patient completed: Thread/unthread right sleeve of pullover shirt/dresss, Thread/unthread left sleeve of pullover shirt/dress, Put head through opening of pull over shirt/dress, Pull shirt over trunk Upper body dressing/undressing: 6: More than reasonable amount of time FIM - Lower Body Dressing/Undressing Lower body dressing/undressing steps patient completed: Thread/unthread right underwear leg, Thread/unthread left underwear leg, Pull underwear up/down, Thread/unthread right pants leg, Thread/unthread left pants leg, Pull pants up/down, Don/Doff right sock, Don/Doff left sock Lower body dressing/undressing: 5: Supervision: Safety issues/verbal cues  FIM - Toileting Toileting steps  completed by patient: Adjust clothing prior to toileting, Performs perineal hygiene, Adjust clothing after toileting Toileting Assistive  Devices: Grab bar or rail for support Toileting: 5: Supervision: Safety issues/verbal cues  FIM - Radio producer Devices: Grab bars, Insurance account manager Transfers: 5-To toilet/BSC: Supervision (verbal cues/safety issues), 5-From toilet/BSC: Supervision (verbal cues/safety issues)  FIM - Control and instrumentation engineer Devices: Environmental consultant, Arm rests Bed/Chair Transfer: 5: Chair or W/C > Bed: Supervision (verbal cues/safety issues), 5: Bed > Chair or W/C: Supervision (verbal cues/safety issues)  FIM - Locomotion: Wheelchair Distance: 150 Locomotion: Wheelchair: 5: Travels 150 ft or more: maneuvers on rugs and over door sills with supervision, cueing or coaxing FIM - Locomotion: Ambulation Locomotion: Ambulation Assistive Devices: Administrator Ambulation/Gait Assistance: 4: Min assist Locomotion: Ambulation: 2: Travels 50 - 149 ft with supervision/safety issues  Comprehension Comprehension Mode: Auditory Comprehension: 6-Follows complex conversation/direction: With extra time/assistive device  Expression Expression Mode: Verbal Expression: 5-Expresses complex 90% of the time/cues < 10% of the time  Social Interaction Social Interaction: 6-Interacts appropriately with others with medication or extra time (anti-anxiety, antidepressant).  Problem Solving Problem Solving: 5-Solves basic 90% of the time/requires cueing < 10% of the time  Memory Memory: 4-Recognizes or recalls 75 - 89% of the time/requires cueing 10 - 24% of the time  Medical Problem List and Plan: 1. Functional deficits secondary to R thigh hematoma, PAD with subsequent weakness/ gait instability 2. DVT rx (March 2016): coumadin therapeutic,  -serial INR's and dose adjustment--appreciate pharmacy assist 3. Pain Management: continue oxycodone and observe for pain tolerance with activity   4. Mood: LCSW to follow for evaluation and support. 5.  Neuropsych: This patient is capable of making decisions on his own behalf. 6. Skin/Wound Care: Continue VAC right thigh at all times -change MWF per [place] 7. Fluids/Electrolytes/Nutrition: Monitor I/O. Labs with HD, 600cc fluids yesterday, usually taking 100% meals 8. DM type2 : Monitor BS ac/hs for pattern--CBGs at goal today 9. Cardiac transplant: On imuran and sirolimus.  10. ESRD: HD on MWF after therapy sessions to avoid conflict with therapy times 11. Constipation: bowel regimen.No incont , last BM since 2 days ago  -continue softener/laxative  LOS (Days) 5 A FACE TO FACE EVALUATION WAS PERFORMED  Charlett Blake 12/07/2014 9:31 AM

## 2014-12-07 NOTE — Progress Notes (Signed)
Occupational Therapy Session Note  Patient Details  Name: Brandon Sharp MRN: [id number] Date of Birth: 09-Feb-1946  Today's Date: 12/07/2014 OT Individual Time: 0800-0900 and 1300-1400 OT Individual Time Calculation (min): 60 min and 60 min   Short Term Goals: Week 1:  OT Short Term Goal 1 (Week 1): STGs = LTGs  Skilled Therapeutic Interventions/Progress Updates:  Session 1: Upon entering the room, pt seated in wheelchair with RN present in room giving medications. Pt with no c/o pain this session. Pt propelled wheelchair with B UEs towards day room with 1 rest break secondary to fatigue. Pt seated in wheelchair and OT educated and demonstrated use of 3 lbs resistive dowel rod for B UE strengthening and endurance.  Pt returning demonstrations for forward rows, reverse rows, bicep curls with 2 sets of 10 of each. Pt requiring frequent rest breaks for fatigue. Therapist providing min verbal cues for proper technique of exercises as well as education regarding energy conservation with self care tasks, general principles, and community integration ( MD appointments). Pt engaged in conversation and providing input as appropriate. Education to continue. Pt returned self to room via same manner as stated above. Pt able to locate room without assistance. OT managing wound vac during session. Pt seated in wheelchair with call bell and all needed items within reach.    Session 2: Upon entering the room,pt seated in wheelchair without c/o pain during session. Pt ambulated with RW and min A to bathroom for toileting. Pt performing toilet transfer with supervision onto standard toilet seat with pt having BM. Pt required steady assist for LB clothing management. Pt ambulating with RW to sink side to wash hands with steady assist. Pt seated in wheelchair and performed 3 sets of 10 wheelchair push ups for B UE strengthening and endurance. Pt also performing 2 sets of 15 leg lifts and high knees while seated in  wheelchair. Pt requiring multiple rest breaks throughout session secondary to fatigue with exercise. O2 and HR stats WFLs during session. Pt returning to bed secondary to going down to dialysis after session. Pt performed wheelchair >bed with use of cane and min A. Sit>supine with supervision. Call bell and all needed items within reach upon exiting the room.   Therapy Documentation Precautions:  Precautions Precautions: Fall Precaution Comments: Wound vac on R groin  Restrictions Weight Bearing Restrictions: No  Pain: Pain Assessment Pain Assessment: No/denies pain ADL: ADL ADL Comments: refer to FIM  See FIM for current functional status  Therapy/Group: Individual Therapy  Brandon Sharp 12/07/2014, 10:17 AM

## 2014-12-07 NOTE — Progress Notes (Signed)
ANTICOAGULATION CONSULT NOTE - Follow Up Consult  Pharmacy Consult for Heparin + Warfarin  Indication: History of recent DVT bilat UE (on Coumadin PTA since March 2016)   Allergies  Allergen Reactions  . Lisinopril Swelling    Lips and tongue swell  . Niacin And Related Other (See Comments)    unknown  . Norvasc [Amlodipine Besylate] Rash    Flushing  . Penicillins Rash    Patient Measurements: Weight: 150 lb 6.4 oz (68.221 kg)   Vital Signs: Temp: 97.8 F (36.6 C) (07/13 0532) Temp Source: Oral (07/13 0532) BP: 114/56 mmHg (07/13 0532) Pulse Rate: 84 (07/13 0532)  Labs:  Recent Labs  12/05/14 0528 12/05/14 1937 12/06/14 0648 12/07/14 0610  HGB 8.0*  --  9.0*  --   HCT 25.2*  --  28.9*  --   PLT 71*  --  82*  --   LABPROT 29.4*  --  26.1* 29.8*  INR 2.85*  --  2.42* 2.89*  CREATININE  --  3.05*  --   --     Estimated Creatinine Clearance: 22.1 mL/min (by C-G formula based on Cr of 3.05).   Assessment: 69 y.o with history of CAD, CHF, ischemic cardiomyopathy s/p MI x 2 in 1990s, cardiac transplant 2001, ESRD, DM2. The patient is on Coumadin PTA for hx of DVT bilat UE(March 2016). Admitted 11/16/14 from HD center due to profound weakness. Found to be anemic due to right thigh hematoma. He is now s/p recent AVG placements/p hematoma evacuation 6/27. 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 Per Dr.Chen- it is not likely to be responsive to endovascular intervention. Patient has elected for observation at this time and no surgery is planned. Coumadin resumed on 11/30/14. IV heparin bridge discontinued on 12/04/14.   INR remains therapeutic today at 2.89. INR trend 2.89<<2.42 < 2.85<< 2.39 << 1.65. No CBC today 12/07/14. Yesterday Hgb stable at 9.0< 8.0<< 8.5 << 9.8<8.1<7.4; plts 82<71< 76 <<80. Anemia - due to AVG bleed / CKD anemia/ and immunosuppression meds- s/p transfusions on 6/22 (2U) , 7/3 (1U) and 7/8 (1U) . On max Aranesp/getting Fe load. CBC stable.No  overt bleeding noted. Over last 48+ hours eating mostly100%meals except only 30% dinner last night.. Non-severe (moderate) malnutrition in context of chronic illness per nutrition consult.  Coumadin PTA dose: 5mg  daily  Since 7/7 he received coumadin 7.5, 5, 5, 7.5 mg, (INR went from 1.65 to 2.39 on 7/9 to 12/04/14) thus dose decreased to 4mg , 4mg , then 5mg  last pm. INR has remained in 2-3 range over last 4 days.    Goal of Therapy:  INR 2-3 Heparin level 0.3-0.7 units/ml (heparin dc'd 12/04/14) Monitor platelets by anticoagulation protocol: Yes   Plan:  1. Warfarin 5 mg x 1 dose at 1800 today 2. Will continue to monitor for any signs/symptoms of bleeding and will follow up with PT/INR in the a.m.   Nicole Cella, RPh Clinical Pharmacist Pager: [phone] 12/07/2014 8:42 AM

## 2014-12-08 ENCOUNTER — Inpatient Hospital Stay (HOSPITAL_COMMUNITY): Payer: Medicare Other | Admitting: Occupational Therapy

## 2014-12-08 ENCOUNTER — Inpatient Hospital Stay (HOSPITAL_COMMUNITY): Payer: Medicare Other | Admitting: Physical Therapy

## 2014-12-08 ENCOUNTER — Encounter (HOSPITAL_COMMUNITY): Payer: Medicare Other

## 2014-12-08 ENCOUNTER — Inpatient Hospital Stay (HOSPITAL_COMMUNITY): Payer: Medicare Other

## 2014-12-08 LAB — CBC
HCT: 26.9 % — ABNORMAL LOW (ref 39.0–52.0)
Hemoglobin: 8.4 g/dL — ABNORMAL LOW (ref 13.0–17.0)
MCH: 28.9 pg (ref 26.0–34.0)
MCHC: 31.2 g/dL (ref 30.0–36.0)
MCV: 92.4 fL (ref 78.0–100.0)
Platelets: 66 10*3/uL — ABNORMAL LOW (ref 150–400)
RBC: 2.91 MIL/uL — ABNORMAL LOW (ref 4.22–5.81)
RDW: 18.6 % — ABNORMAL HIGH (ref 11.5–15.5)
WBC: 3.4 10*3/uL — AB (ref 4.0–10.5)

## 2014-12-08 LAB — PROTIME-INR
INR: 2.49 — AB (ref 0.00–1.49)
Prothrombin Time: 26.6 seconds — ABNORMAL HIGH (ref 11.6–15.2)

## 2014-12-08 LAB — GLUCOSE, CAPILLARY
Glucose-Capillary: 85 mg/dL (ref 65–99)
Glucose-Capillary: 129 mg/dL — ABNORMAL HIGH (ref 65–99)
Glucose-Capillary: 128 mg/dL — ABNORMAL HIGH (ref 65–99)

## 2014-12-08 MED ORDER — WARFARIN SODIUM 6 MG PO TABS
6.0000 mg | ORAL_TABLET | Freq: Once | ORAL | Status: AC
Start: 2014-12-08 — End: 2014-12-08
  Administered 2014-12-08: 6 mg via ORAL
  Filled 2014-12-08 (×2): qty 1

## 2014-12-08 NOTE — Progress Notes (Signed)
Physical Therapy Session Note  Patient Details  Name: Brandon Sharp MRN: [id number] Date of Birth: 14-Nov-1945  Today's Date: 12/08/2014 PT Individual Time: 1330-1400 PT Individual Time Calculation (min): 30 min   Short Term Goals: Week 1:  PT Short Term Goal 1 (Week 1): STG =LTG  Skilled Therapeutic Interventions/Progress Updates:    Patient seen for 1:1 session.  Propelled in wheelchair to therapy gym 150' supervision with one rest break.  Patient sit to stand minguard and ambulated w/ SPC x 90' min assist.  Patient educated in fall risk and balance test assists in determining most appropriate device to prevent falls.  Performed Berg balance assessment, score 13/56.  Patient educated best to use RW for fall prevention.  Assisted back to room in chair and left with needs in reach and wife in room.  Therapy Documentation Precautions:  Precautions Precautions: Fall Precaution Comments: Wound vac on R groin  Restrictions Weight Bearing Restrictions: No Pain: Pain Assessment Pain Assessment: No/denies pain Locomotion : Ambulation Ambulation/Gait Assistance: 4: Min assist Wheelchair Mobility Distance: 150   Balance: Standardized Balance Assessment Standardized Balance Assessment: Berg Balance Test Berg Balance Test Sit to Stand: Able to stand  independently using hands Standing Unsupported: Unable to stand 30 seconds unassisted Sitting with Back Unsupported but Feet Supported on Floor or Stool: Able to sit safely and securely 2 minutes Stand to Sit: Controls descent by using hands Transfers: Able to transfer with verbal cueing and /or supervision Standing Unsupported with Eyes Closed: Needs help to keep from falling Standing Ubsupported with Feet Together: Needs help to attain position and unable to hold for 15 seconds From Standing, Reach Forward with Outstretched Arm: Reaches forward but needs supervision From Standing Position, Pick up Object from Floor: Unable to try/needs  assist to keep balance From Standing Position, Turn to Look Behind Over each Shoulder: Needs assist to keep from losing balance and falling Turn 360 Degrees: Needs assistance while turning Standing Unsupported, Alternately Place Feet on Step/Stool: Needs assistance to keep from falling or unable to try Standing Unsupported, One Foot in Front: Loses balance while stepping or standing Standing on One Leg: Unable to try or needs assist to prevent fall Total Score: 13  See FIM for current functional status  Therapy/Group: Individual Therapy  [place], [place] 12/08/2014  12/08/2014, 4:29 PM

## 2014-12-08 NOTE — Consult Note (Signed)
  NEUROCOGNITIVE [place]   Mr. Brandon Sharp is a 69 year old man, who was seen for a brief neurocognitive status examination to evaluate his emotional state and mental status in the setting of physical deconditioning.  According to his medical record, he was admitted on 11/16/14 due to profound weakness.  He was found to have multiple medical conditions requiring treatment and was ultimately in a deconditioned state.  Of note, his medical history includes two kidney transplants with dialysis currently and cardiac transplant in 2001.    Emotional Functioning:  During the clinical interview, Brandon Sharp stated that he was doing "pretty well" overall.  He said that he feels motivated to participate in his inpatient therapies and finds himself looking forward to his recovery.  He commented that in the past, he had in-home physical therapy, which he was disappointed with, but has been quite satisfied with the therapy he is receiving here.  He denied symptoms of depression, including suicidal ideation, as well as any major worries or concerns.  He described variable appetite, but said that he is generally sleeping well.  Brandon Sharp said that he feels he is able to cope well because he can reflect on his past physical ailments and how he was able to recover from those in order to provide motivation and optimism that he will successfully recover from this.  Of note, his responses to self-report measures of mood disruption were not suggestive of the presence of clinically significant depression or anxiety at this time.    Mental Status:  Brandon Sharp total score on a very brief measure of mental status was not suggestive of the presence of significant cognitive disruption at this time (MMSE-2 brief = 14/16).  He lost points for misstating the date and for freely recalling only 2 of 3 previously studied words after a brief delay.  Subjectively, he  denied noticing cognitive difficulties.    Impressions and Recommendations:  Brandon Sharp total score on a very brief measure of mental status was not suggestive of the presence of significant cognitive disruption at this time.  However, given prolonged kidney disease and cardiac problems, he is at risk for development of cognitive dysfunction in the future.  He was provided with education about cognitive symptoms that may develop as a result of those conditions and was advised that he can seek a comprehensive neuropsychological evaluation should any of those symptoms arise.  He appeared to understand and was receptive.  From an emotional standpoint, he seems to be coping well and there were no concerns for underlying psychopathology.  A follow-up session with the neuropsychologist could be requested should his treatment team feel that it would be beneficial in informing care.    DIAGNOSES:   ESRD on dialysis Physical deconditioning  Marlane Hatcher, Psy.D.  Clinical Neuropsychologist

## 2014-12-08 NOTE — Progress Notes (Signed)
Patient returned from dialysis 2150. BP dropped towards end of treatment, stable now; no other complications.  Upon arriving to unit, pt stated he was very tired and did not feel well.  Wound vac not changed due to patient state at this time.  Pt accepted fruit cup but denied dinner tray.  Will continue to monitor.

## 2014-12-08 NOTE — Progress Notes (Signed)
Occupational Therapy Session Note  Patient Details  Name: Brandon Sharp MRN: [id number] Date of Birth: 1946-02-04  Today's Date: 12/08/2014 OT Individual Time: 1740-8144 and 8185-6314 OT Individual Time Calculation (min): 55 min and 44 min    Short Term Goals: Week 1:  OT Short Term Goal 1 (Week 1): STGs = LTGs  Skilled Therapeutic Interventions/Progress Updates:  Session 1: Upon entering the room, pt supine in bed with no c/o pain. Supervision for supine>sit and min A stand pivot transfer into wheelchair with use of SPC. Skilled OT session with focus on dynamic standing balance, STS, functional transfers, and self care retraining. Pt engaged in bathing and dressing at sink side from wheelchair with supervision for sit <> stand. Pt required steady assist with balance when standing for LB clothing management and to wash buttocks and peri area. Pt clearly avoiding standing as much as possible during session. OT asked pt to stand to brush teeth but he refused. OT educated pt on purpose of therapy sessions and OT goals.  Pt seated in wheelchair with call bell and all needed items within reach upon exiting the room.   Session 2: Upon entering the room, pt seated in wheelchair with no c/o pain. Pt propelled wheelchair with B UEs and B LE's  150' towards day room with 1 rest break secondary to fatigue. Pt requiring min verbal cues for set up of wheelchair for sit <>stand. Pt standing from wheelchair at table top with supervision for sit <>stand and standing for 4 min,  3 min , and 4 min respectively during task. Pt standing to complete pipe tree activity with mod verbal cues for B UEs to not support weight on table. Pt very fearful of falling and therapist having to hold one hand at pt's side during parts of task. Pt required close supervision - min A for balance to complete task. Pt sitting twice secondary to fatigue and reporting, "My legs are so tired. I feel like they may buckle." OT educating pt on  functional activities in which pt engages B UEs in task such as LB clothing management. Pt verbalized understanding and reports he realizes he has been allowing his wife to assist him with things that he can do for far to long. Pt propelled self back to room in same manner as above. Therapist managing wound vac during session. Call bell and all needed items within reach upon exiting the room.    Therapy Documentation Precautions:  Precautions Precautions: Fall Precaution Comments: Wound vac on R groin  Restrictions Weight Bearing Restrictions: No General:   Vital Signs: Therapy Vitals Temp: 98.1 F (36.7 C) Temp Source: Oral Pulse Rate: 85 Resp: 19 BP: (!) 121/40 mmHg (pt asymptomatic) Patient Position (if appropriate): Lying Oxygen Therapy SpO2: 98 % O2 Device: Not Delivered ADL: ADL ADL Comments: refer to FIM  See FIM for current functional status  Therapy/Group: Individual Therapy  [name] 12/08/2014, 8:58 AM

## 2014-12-08 NOTE — Progress Notes (Signed)
Subjective:   2.5 kg off w HD yest, late BP drop w minimal symptoms. Leg edema much better  Objective Filed Vitals:   12/07/14 2155 12/07/14 2212 12/08/14 0614 12/08/14 0655  BP: 110/38 115/54 121/40   Pulse: 91  85   Temp: 97.4 F (36.3 C)  98.1 F (36.7 C)   TempSrc: Oral  Oral   Resp: 18  19   Weight:    67.9 kg (149 lb 11.1 oz)  SpO2: 100%  98%    Physical Exam Alert, no distress No jvd Chest clear bilat to bases RRR no RG Abd soft ntnd +bs R thigh AVG +bruit, wound vac in place Bilat UE mild edema, LE edema better trace-1+ L IJ cath Neuro is alert, nf, ox 3   NW  MWF 4 hours 72kg  2/2 bath  4hr Heparin 3000 (no hep at dc, on coumadin) Hectorol 4  Aranesp 200 q Wed no Fe  Assessment: 1. ESRD - MWF HD - no heparin with HD since on anticoagulation 2. Vol excess - edema improved, wt down 68kg, gradually lower a bit more as tolerated on HD 3. Dehisced R thigh AVG wound w hematoma , s/p hematoma evacuation + wound VAC placement 6/27. Off abx, cx's neg. 4. RLE arterial insufficiency - s/p angio / per Dr.Chen- not likely to be responsive to endovascular intervention- Plan for now is observation 5. Anemia - due to AVG bleed / CKD Anemia/ and immunosuppression meds- s/p transfusions on 6/22 (2U) and 7/3 (1U). On max ESA/ sp Fe load 6. HTN on coreg 6.25 qhs and tid Isordil 40mg .   7. Metabolic bone disease - Hectorol 4. Phos 4. Reduced ca acetate BID with meals this admission 8. 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 - INR 2.39  9. CAD / remote CABG / heart transplant 2001- po meds Imuran, Rapamune, Prednisone 10. Dm type 2- per primary service 11. Malnutrition - alb mid 2's - renal diet/vits/supplements 12. Thrombocytopenia - increases bleeding risk 13. Disposition - rehab for 2 more weeks per pt  Plan: HD Friday   Kelly Splinter MD pager [phone]    cell [phone] 12/08/2014, 9:32 AM    Additional Objective Labs: Basic Metabolic Panel:  Recent Labs Lab  12/02/14 1554 12/05/14 1937 12/07/14 1740  NA 136 136 134*  K 3.3* 3.5 3.8  CL 100* 99* 100*  CO2 26 27 25   GLUCOSE 123* 102* 100*  BUN 27* 8 29*  CREATININE 7.42* 3.05* 7.37*  CALCIUM 9.8 8.2* 9.3  PHOS 2.8 2.0* 3.8   Liver Function Tests:  Recent Labs Lab 12/02/14 1554 12/05/14 1937 12/07/14 1740  ALBUMIN 2.6* 3.1* 2.8*   No results for input(s): LIPASE, AMYLASE in the last 168 hours. CBC:  Recent Labs Lab 12/03/14 0504 12/04/14 0512 12/05/14 0528 12/06/14 0648 12/08/14 0507  WBC 5.4 4.5 3.8* 3.9* 3.4*  HGB 9.8* 8.5* 8.0* 9.0* 8.4*  HCT 30.8* 26.9* 25.2* 28.9* 26.9*  MCV 91.1 92.4 92.0 92.9 92.4  PLT 80* 76* 71* 82* 66*   Blood Culture    Component Value Date/Time   SDES WOUND GROIN RIGHT 11/21/2014 1007   SDES WOUND GROIN RIGHT 11/21/2014 1007   SPECREQUEST RIGHT GROIN HEMATOMA PT ON VANCOMYCIN 11/21/2014 1007   SPECREQUEST RIGHT GROIN HEMATOMA PT ON VANCOMYCIN 11/21/2014 1007   CULT  11/21/2014 1007    NO ANAEROBES ISOLATED Performed at [place]  11/21/2014 1007    NO GROWTH 3 DAYS  Performed at [facility] 11/26/2014 FINAL 11/21/2014 1007   REPTSTATUS 11/24/2014 FINAL 11/21/2014 1007    Cardiac Enzymes: No results for input(s): CKTOTAL, CKMB, CKMBINDEX, TROPONINI in the last 168 hours. CBG:  Recent Labs Lab 12/07/14 0634 12/07/14 1143 12/07/14 1639 12/07/14 2152 12/08/14 0650  GLUCAP 103* 102* 89 84 85   Iron Studies: No results for input(s): IRON, TIBC, TRANSFERRIN, FERRITIN in the last 72 hours. @lablastinr3 @ Studies/Results: No results found. Medications:   . atorvastatin  40 mg Oral Daily  . azaTHIOprine  75 mg Oral q morning - 10a  . calcium acetate  667 mg Oral BID WC  . carvedilol  6.25 mg Oral QHS  . colesevelam  3,750 mg Oral Daily  . darbepoetin (ARANESP) injection - DIALYSIS  200 mcg Intravenous Q Wed-HD  . doxercalciferol  4 mcg Intravenous Q M,W,F-HD  . famotidine  10 mg  Oral QHS  . feeding supplement (PRO-STAT SUGAR FREE 64)  30 mL Oral BID  . feeding supplement  1 Container Oral BID BM  . insulin aspart  0-5 Units Subcutaneous QHS  . isosorbide dinitrate  40 mg Oral 3 times per day  . multivitamin  1 tablet Oral QHS  . pantoprazole  40 mg Oral Daily  . predniSONE  5 mg Oral Q breakfast  . senna-docusate  3 tablet Oral BID  . Sirolimus  1.5 mg Oral Daily  . warfarin  6 mg Oral ONCE-1800  . Warfarin - Pharmacist Dosing Inpatient   Does not apply [phone]

## 2014-12-08 NOTE — Progress Notes (Signed)
ANTICOAGULATION CONSULT NOTE - Follow Up Consult  Pharmacy Consult for coumadin Indication: hx of DVT  Allergies  Allergen Reactions  . Lisinopril Swelling    Lips and tongue swell  . Niacin And Related Other (See Comments)    unknown  . Norvasc [Amlodipine Besylate] Rash    Flushing  . Penicillins Rash    Patient Measurements: Weight: 149 lb 11.1 oz (67.9 kg) Heparin Dosing Weight:   Vital Signs: Temp: 98.1 F (36.7 C) (07/14 0614) Temp Source: Oral (07/14 0614) BP: 121/40 mmHg (07/14 0614) Pulse Rate: 85 (07/14 0614)  Labs:  Recent Labs  12/05/14 1937 12/06/14 0648 12/07/14 0610 12/07/14 1740 12/08/14 0507  HGB  --  9.0*  --   --  8.4*  HCT  --  28.9*  --   --  26.9*  PLT  --  82*  --   --  66*  LABPROT  --  26.1* 29.8*  --  26.6*  INR  --  2.42* 2.89*  --  2.49*  CREATININE 3.05*  --   --  7.37*  --     Estimated Creatinine Clearance: 9.1 mL/min (by C-G formula based on Cr of 7.37).   Medications:  Scheduled:  . atorvastatin  40 mg Oral Daily  . azaTHIOprine  75 mg Oral q morning - 10a  . calcium acetate  667 mg Oral BID WC  . carvedilol  6.25 mg Oral QHS  . colesevelam  3,750 mg Oral Daily  . darbepoetin (ARANESP) injection - DIALYSIS  200 mcg Intravenous Q Wed-HD  . doxercalciferol  4 mcg Intravenous Q M,W,F-HD  . famotidine  10 mg Oral QHS  . feeding supplement (PRO-STAT SUGAR FREE 64)  30 mL Oral BID  . feeding supplement  1 Container Oral BID BM  . insulin aspart  0-5 Units Subcutaneous QHS  . isosorbide dinitrate  40 mg Oral 3 times per day  . multivitamin  1 tablet Oral QHS  . pantoprazole  40 mg Oral Daily  . predniSONE  5 mg Oral Q breakfast  . senna-docusate  3 tablet Oral BID  . Sirolimus  1.5 mg Oral Daily  . warfarin  6 mg Oral ONCE-1800  . Warfarin - Pharmacist Dosing Inpatient   Does not apply q1800   Infusions:    Assessment: 69 yo male with hx of DVT is currently on therapeutic coumadin.  INR down to 2.49 today.  Coumadin  dose missed last night? (Not charted) Goal of Therapy:  INR 2-3 Monitor platelets by anticoagulation protocol: Yes   Plan:  - coumadin 6 mg po x1 - INR in am  Jason Frisbee, [place] 12/08/2014,8:29 AM

## 2014-12-08 NOTE — Progress Notes (Signed)
[facility] PHYSICAL MEDICINE & REHABILITATION     PROGRESS NOTE    Subjective/Complaints: Finished shower Discussed discharge date, patient is aware of 12/14/2014  ROS: Pt denies , nausea, vomiting,diarrhea, chest pain, shortness of breath, palpitations, neck or back pain,    Objective: Vital Signs: Blood pressure 121/40, pulse 85, temperature 98.1 F (36.7 C), temperature source Oral, resp. rate 19, weight 67.9 kg (149 lb 11.1 oz), SpO2 98 %. No results found.  Recent Labs  12/06/14 0648 12/08/14 0507  WBC 3.9* 3.4*  HGB 9.0* 8.4*  HCT 28.9* 26.9*  PLT 82* 66*    Recent Labs  12/05/14 1937 12/07/14 1740  NA 136 134*  K 3.5 3.8  CL 99* 100*  GLUCOSE 102* 100*  BUN 8 29*  CREATININE 3.05* 7.37*  CALCIUM 8.2* 9.3   CBG (last 3)   Recent Labs  12/07/14 1639 12/07/14 2152 12/08/14 0650  GLUCAP 89 84 85    Wt Readings from Last 3 Encounters:  12/08/14 67.9 kg (149 lb 11.1 oz)  12/02/14 70.1 kg (154 lb 8.7 oz)  11/08/14 74.844 kg (165 lb)    Physical Exam:  Constitutional: He is oriented to person, place, and time. He appears well-developed and well-nourished.  HENT:  Head: Normocephalic and atraumatic.  Right Ear: External ear normal.  Left Ear: External ear normal.  Eyes: Conjunctivae and EOM are normal. Pupils are equal, round, and reactive to light.  Neck: Normal range of motion.  Cardiovascular: Normal rate and normal heart sounds.  Respiratory: No respiratory distress.  GI: He exhibits no distension.  Musculoskeletal: He exhibits edema.  Thigh tender to ROM/palpation.RUE with 2+ and  LUE with 1+ +edema still present. 1+ pretib and 2+ pedal edema, Neurological: He is alert and oriented to person, place, and time.  UE: 5/5 prox to distal. RLE: 2+hf, 3 ke, 4 adf/apf, LLE: 3 hf, 3+ke, 4 adf/apf. No sensory deficits. attention/awareness improving      Skin: Skin is dry.  Psychiatric: He has a normal mood and affect. His behavior is  normal.   Assessment/Plan: 1. Functional deficits secondary to debility related to right thigh hematoma which require 3+ hours per day of interdisciplinary therapy in a comprehensive inpatient rehab setting. Physiatrist is providing close team supervision and 24 hour management of active medical problems listed below. Physiatrist and rehab team continue to assess barriers to discharge/monitor patient progress toward functional and medical goals.   FIM: FIM - Bathing Bathing Steps Patient Completed: Chest, Right Arm, Left Arm, Abdomen, Front perineal area, Buttocks, Right upper leg, Left upper leg, Right lower leg (including foot), Left lower leg (including foot) Bathing: 4: Steadying assist  FIM - Upper Body Dressing/Undressing Upper body dressing/undressing steps patient completed: Thread/unthread right sleeve of pullover shirt/dresss, Thread/unthread left sleeve of pullover shirt/dress, Put head through opening of pull over shirt/dress, Pull shirt over trunk Upper body dressing/undressing: 5: Set-up assist to: Obtain clothing/put away FIM - Lower Body Dressing/Undressing Lower body dressing/undressing steps patient completed: Thread/unthread right underwear leg, Thread/unthread left underwear leg, Pull underwear up/down, Thread/unthread right pants leg, Thread/unthread left pants leg, Pull pants up/down, Don/Doff right sock, Don/Doff left sock, Don/Doff right shoe, Don/Doff left shoe Lower body dressing/undressing: 4: Steadying Assist  FIM - Toileting Toileting steps completed by patient: Adjust clothing prior to toileting, Performs perineal hygiene, Adjust clothing after toileting Toileting Assistive Devices: Grab bar or rail for support Toileting: 4: Steadying assist  FIM - Radio producer Devices: Grab bars, Environmental consultant  Toilet Transfers: 5-To toilet/BSC: Supervision (verbal cues/safety issues), 5-From toilet/BSC: Supervision (verbal cues/safety issues)  FIM -  Control and instrumentation engineer Devices: Best boy: 5: Supine > Sit: Supervision (verbal cues/safety issues), 4: Bed > Chair or W/C: Min A (steadying Pt. > 75%)  FIM - Locomotion: Wheelchair Distance: 175 Locomotion: Wheelchair: 5: Travels 150 ft or more: maneuvers on rugs and over door sills with supervision, cueing or coaxing FIM - Locomotion: Ambulation Locomotion: Ambulation Assistive Devices: Journalist, newspaper Ambulation/Gait Assistance: 4: Min guard Locomotion: Ambulation: 2: Travels 50 - 149 ft with supervision/safety issues  Comprehension Comprehension Mode: Auditory Comprehension: 6-Follows complex conversation/direction: With extra time/assistive device  Expression Expression Mode: Verbal Expression: 6-Expresses complex ideas: With extra time/assistive device  Social Interaction Social Interaction: 6-Interacts appropriately with others with medication or extra time (anti-anxiety, antidepressant).  Problem Solving Problem Solving: 5-Solves basic 90% of the time/requires cueing < 10% of the time  Memory Memory: 4-Recognizes or recalls 75 - 89% of the time/requires cueing 10 - 24% of the time  Medical Problem List and Plan: 1. Functional deficits secondary to R thigh hematoma, PAD with subsequent weakness/ gait instability 2. DVT rx (March 2016): coumadin therapeutic,  -serial INR's and dose adjustment--appreciate pharmacy assist 3. Pain Management: continue oxycodone and observe for pain tolerance with activity   4. Mood: LCSW to follow for evaluation and support. 5. Neuropsych: This patient is capable of making decisions on his own behalf. 6. Skin/Wound Care: Continue VAC right thigh at all times -change MWF per [place] 7. Fluids/Electrolytes/Nutrition: Monitor I/O. Labs with HD,480cc fluids yesterday,taking 20-100% meals 8. DM type2 : Monitor BS ac/hs for pattern--CBGs at goal today, but on lower side   Cont to monitor 9. Cardiac transplant: On imuran and sirolimus.  10. ESRD: HD on MWF after therapy sessions to avoid conflict with therapy times- took off 2.5 L , nephro to manage 11. Constipation: bowel regimen.No incont , last BM since 2 days ago  -adjust softener/laxative  LOS (Days) 6 A FACE TO FACE EVALUATION WAS PERFORMED  Lash Matulich E 12/08/2014 9:02 AM

## 2014-12-08 NOTE — Progress Notes (Signed)
Physical Therapy Session Note  Patient Details  Name: Brandon Sharp MRN: [id number] Date of Birth: 07/20/1945  Today's Date: 12/08/2014 PT Individual Time: 1605-1700 PT Individual Time Calculation (min): 55 min   Short Term Goals: Week 1:  PT Short Term Goal 1 (Week 1): STG =LTG  Skilled Therapeutic Interventions/Progress Updates:   Session focused on functional ambulation, stair negotiation, strengthening, and activity tolerance. Patient stood from edge of bed and ambulated using SPC out of room with close supervision before requesting to use wheelchair to get to gym due to "legs needing to wake up." Patient propelled wheelchair BUE/BLE x 180 ft with supervision and one rest break. Performed sit <> stands from wheelchair with supervision. Step ups to 6" step leading with LLE ascending forward/RLE descending backward x 10 with 2 rails. Stair training up/down four 6" steps using 2 rails x 2 with min A and seated rest between. Patient appeared surprised at ability to negotiate steps. Gait training using SPC 2 x 50 ft +25 ft with close supervision-min guard and assist for wound vac. NuStep using BUE/BLE at level 4 x 10 min. Patient tolerated treatment well with frequent rest breaks due to SOB and fatigue. Patient left sitting in wheelchair with all needs within reach.   Therapy Documentation Precautions:  Precautions Precautions: Fall Precaution Comments: Wound vac on R groin  Restrictions Weight Bearing Restrictions: No Pain: Pain Assessment Pain Assessment: No/denies pain  See FIM for current functional status  Therapy/Group: Individual Therapy  Laretta Alstrom 12/08/2014, 4:49 PM

## 2014-12-09 ENCOUNTER — Inpatient Hospital Stay (HOSPITAL_COMMUNITY): Payer: Medicare Other | Admitting: Rehabilitation

## 2014-12-09 ENCOUNTER — Inpatient Hospital Stay (HOSPITAL_COMMUNITY): Payer: Medicare Other | Admitting: Physical Therapy

## 2014-12-09 ENCOUNTER — Inpatient Hospital Stay (HOSPITAL_COMMUNITY): Payer: Medicare Other | Admitting: Occupational Therapy

## 2014-12-09 LAB — RENAL FUNCTION PANEL
ANION GAP: 10 (ref 5–15)
Albumin: 2.8 g/dL — ABNORMAL LOW (ref 3.5–5.0)
BUN: 23 mg/dL — ABNORMAL HIGH (ref 6–20)
CALCIUM: 9.5 mg/dL (ref 8.9–10.3)
CHLORIDE: 101 mmol/L (ref 101–111)
CO2: 24 mmol/L (ref 22–32)
Creatinine, Ser: 6.54 mg/dL — ABNORMAL HIGH (ref 0.61–1.24)
GFR, EST AFRICAN AMERICAN: 9 mL/min — AB (ref >60)
GFR, EST NON AFRICAN AMERICAN: 8 mL/min — AB (ref >60)
Glucose, Bld: 145 mg/dL — ABNORMAL HIGH (ref 65–99)
Phosphorus: 2.8 mg/dL (ref 2.5–4.6)
Potassium: 3.2 mmol/L — ABNORMAL LOW (ref 3.5–5.1)
Sodium: 135 mmol/L (ref 135–145)

## 2014-12-09 LAB — PROTIME-INR
INR: 2.46 — AB (ref 0.00–1.49)
Prothrombin Time: 26.4 seconds — ABNORMAL HIGH (ref 11.6–15.2)

## 2014-12-09 LAB — GLUCOSE, CAPILLARY
GLUCOSE-CAPILLARY: 126 mg/dL — AB (ref 65–99)
GLUCOSE-CAPILLARY: 86 mg/dL (ref 65–99)
GLUCOSE-CAPILLARY: 121 mg/dL — AB (ref 65–99)

## 2014-12-09 LAB — HEPATITIS B SURFACE ANTIGEN: Hepatitis B Surface Ag: NEGATIVE

## 2014-12-09 MED ORDER — SODIUM CHLORIDE 0.9 % IV SOLN: 100.0000 mL | INTRAVENOUS | Status: DC | PRN

## 2014-12-09 MED ORDER — HEPARIN SODIUM (PORCINE) 1000 UNIT/ML DIALYSIS: 1000.0000 [IU] | INTRAMUSCULAR | Status: DC | PRN

## 2014-12-09 MED ORDER — LIDOCAINE-PRILOCAINE 2.5-2.5 % EX CREA: 1.0000 "application " | TOPICAL_CREAM | CUTANEOUS | Status: DC | PRN

## 2014-12-09 MED ORDER — ALTEPLASE 2 MG IJ SOLR: 2.0000 mg | Freq: Once | INTRAMUSCULAR | Status: DC | PRN

## 2014-12-09 MED ORDER — DOXERCALCIFEROL 4 MCG/2ML IV SOLN
INTRAVENOUS | Status: AC
  Filled 2014-12-09: qty 2

## 2014-12-09 MED ORDER — WARFARIN SODIUM 5 MG PO TABS
5.0000 mg | ORAL_TABLET | Freq: Once | ORAL | Status: AC
  Administered 2014-12-09: 5 mg via ORAL
  Filled 2014-12-09 (×2): qty 1

## 2014-12-09 MED ORDER — LIDOCAINE HCL (PF) 1 % IJ SOLN: 5.0000 mL | INTRAMUSCULAR | Status: DC | PRN

## 2014-12-09 MED ORDER — BOOST / RESOURCE BREEZE PO LIQD
1.0000 | Freq: Two times a day (BID) | ORAL | Status: DC
  Administered 2014-12-12 – 2014-12-14 (×3): 1 via ORAL

## 2014-12-09 MED ORDER — PENTAFLUOROPROP-TETRAFLUOROETH EX AERO: 1.0000 "application " | INHALATION_SPRAY | CUTANEOUS | Status: DC | PRN

## 2014-12-09 MED ORDER — NEPRO/CARBSTEADY PO LIQD: 237.0000 mL | ORAL | Status: DC | PRN

## 2014-12-09 MED ORDER — HEPARIN SODIUM (PORCINE) 1000 UNIT/ML DIALYSIS: 2000.0000 [IU] | INTRAMUSCULAR | Status: DC | PRN

## 2014-12-09 NOTE — Progress Notes (Signed)
[facility] PHYSICAL MEDICINE & REHABILITATION     PROGRESS NOTE    Subjective/Complaints: Has wound vac wanting to "get free of it"  ROS: Pt denies , nausea, vomiting,diarrhea, chest pain, shortness of breath,   Objective: Vital Signs: Blood pressure 111/50, pulse 84, temperature 98.5 F (36.9 C), temperature source Oral, resp. rate 20, weight 65.7 kg (144 lb 13.5 oz), SpO2 100 %. No results found.  Recent Labs  12/08/14 0507  WBC 3.4*  HGB 8.4*  HCT 26.9*  PLT 66*    Recent Labs  12/07/14 1740  NA 134*  K 3.8  CL 100*  GLUCOSE 100*  BUN 29*  CREATININE 7.37*  CALCIUM 9.3   CBG (last 3)   Recent Labs  12/08/14 1701 12/08/14 2118 12/09/14 0627  GLUCAP 128* 126* 86    Wt Readings from Last 3 Encounters:  12/09/14 65.7 kg (144 lb 13.5 oz)  12/02/14 70.1 kg (154 lb 8.7 oz)  11/08/14 74.844 kg (165 lb)    Physical Exam:  Constitutional: He is oriented to person, place, and time. He appears well-developed and well-nourished.  HENT:  Head: Normocephalic and atraumatic.  Right Ear: External ear normal.  Left Ear: External ear normal.  Eyes: Conjunctivae and EOM are normal. Pupils are equal, round, and reactive to light.  Neck: Normal range of motion.  Cardiovascular: Normal rate and normal heart sounds.  Respiratory: No respiratory distress.  GI: He exhibits no distension.  Musculoskeletal: He exhibits edema.  Marland KitchenRUE with 2+ and  LUE with 1+ +edema still present. 1+ pretib and 2+ pedal edema, Neurological: He is alert and oriented to person, place, and time.  UE: 5/5 prox to distal. RLE: 2+hf, 3 ke, 4 adf/apf, LLE: 3 hf, 3+ke, 4 adf/apf. No sensory deficits. attention/awareness improving      Skin: Skin is dry.  Psychiatric: He has a normal mood and affect. His behavior is normal.   Assessment/Plan: 1. Functional deficits secondary to debility related to right thigh hematoma which require 3+ hours per day of interdisciplinary therapy in a  comprehensive inpatient rehab setting. Physiatrist is providing close team supervision and 24 hour management of active medical problems listed below. Physiatrist and rehab team continue to assess barriers to discharge/monitor patient progress toward functional and medical goals.   FIM: FIM - Bathing Bathing Steps Patient Completed: Chest, Right Arm, Left Arm, Abdomen, Front perineal area, Buttocks, Right upper leg, Left upper leg, Right lower leg (including foot), Left lower leg (including foot) Bathing: 4: Steadying assist  FIM - Upper Body Dressing/Undressing Upper body dressing/undressing steps patient completed: Thread/unthread right sleeve of pullover shirt/dresss, Thread/unthread left sleeve of pullover shirt/dress, Put head through opening of pull over shirt/dress, Pull shirt over trunk Upper body dressing/undressing: 5: Set-up assist to: Obtain clothing/put away FIM - Lower Body Dressing/Undressing Lower body dressing/undressing steps patient completed: Thread/unthread right underwear leg, Thread/unthread left underwear leg, Pull underwear up/down, Thread/unthread right pants leg, Thread/unthread left pants leg, Pull pants up/down, Don/Doff right sock, Don/Doff left sock, Don/Doff right shoe, Don/Doff left shoe Lower body dressing/undressing: 4: Steadying Assist  FIM - Toileting Toileting steps completed by patient: Adjust clothing prior to toileting, Performs perineal hygiene, Adjust clothing after toileting Toileting Assistive Devices: Grab bar or rail for support Toileting: 4: Steadying assist  FIM - Radio producer Devices: Grab bars, Insurance account manager Transfers: 5-To toilet/BSC: Supervision (verbal cues/safety issues), 5-From toilet/BSC: Supervision (verbal cues/safety issues)  FIM - Control and instrumentation engineer Devices: Aeronautical engineer  Transfer: 4: Chair or W/C > Bed: Min A (steadying Pt. > 75%)  FIM - Locomotion:  Wheelchair Distance: 150 Locomotion: Wheelchair: 5: Travels 150 ft or more: maneuvers on rugs and over door sills with supervision, cueing or coaxing FIM - Locomotion: Ambulation Locomotion: Ambulation Assistive Devices: Journalist, newspaper Ambulation/Gait Assistance: 5: Supervision, 4: Min guard Locomotion: Ambulation: 2: Travels 50 - 149 ft with minimal assistance (Pt.>75%)  Comprehension Comprehension Mode: Auditory Comprehension: 6-Follows complex conversation/direction: With extra time/assistive device  Expression Expression Mode: Verbal Expression: 6-Expresses complex ideas: With extra time/assistive device  Social Interaction Social Interaction: 6-Interacts appropriately with others with medication or extra time (anti-anxiety, antidepressant).  Problem Solving Problem Solving: 5-Solves complex 90% of the time/cues < 10% of the time  Memory Memory: 5-Recognizes or recalls 90% of the time/requires cueing < 10% of the time  Medical Problem List and Plan: 1. Functional deficits secondary to R thigh hematoma, PAD with subsequent weakness/ gait instability 2. DVT rx (March 2016): coumadin therapeutic,  -serial INR's and dose adjustment--appreciate pharmacy assist 3. Pain Management: continue oxycodone and observe for pain tolerance with activity   4. Mood: LCSW to follow for evaluation and support. 5. Neuropsych: This patient is capable of making decisions on his own behalf. 6. Skin/Wound Care: Continue VAC right thigh at all times -change MWF per [place] 7. Fluids/Electrolytes/Nutrition: Monitor I/O. Labs with HD, po fluid ~772ml on 7/14 8. DM type2 : Monitor BS ac/hs for pattern--CBGs at goal today, but on lower side  Cont to monitor 9. Cardiac transplant: On imuran and sirolimus.  10. ESRD: HD on MWF after therapy sessions to avoid conflict with therapy times- limited draw off of fluid due to hypotension , nephro to manage 11. Constipation:  bowel regimen.No incont , 2 BMs recorded yesterday  -adjust softener/laxative  LOS (Days) 7 A FACE TO FACE EVALUATION WAS PERFORMED  Brandon Sharp E 12/09/2014 8:28 AM

## 2014-12-09 NOTE — Progress Notes (Signed)
Occupational Therapy Weekly Progress Note  Patient Details  Name: Brandon Sharp MRN: [id number] Date of Birth: 1945/09/01  Beginning of progress report period: December 03, 2014 End of progress report period: December 09, 2014  Today's Date: 12/09/2014 OT Individual Time: 0240-9735 and 1300-1400 OT Individual Time Calculation (min): 59 min and 60 min    STGs=LTGs as pt has upcoming discharge scheduled. Pt making steady progress towards goals this week. Pt requires increased verbal cues for motivation during session for standing tasks.  Pt continues to require supervision - steady assist for dynamic standing balance tasks but is utilizing cane for transfers and functional ambulation. Pressure relief educated started today.    Patient continues to demonstrate the following deficits: dynamic standing balance, decreased I in self care, decreased safety awareness,  and therefore will continue to benefit from skilled OT intervention to enhance overall performance with BADL.  Patient progressing toward long term goals..  Continue plan of care.  OT Short Term Goals Week 2:  OT Short Term Goal 1 (Week 2): STG=LTGs secondary to upcoming discharge.  Skilled Therapeutic Interventions/Progress Updates:  Session 1:  Upon entering the room, pt supine in bed finishing breakfast with no c/o pain this session. Pt performing stand pivot transfer from bed >wheelchair with use of cane and min A.  Pt propelled wheelchair to sink side for sit <>stand at sink for bathing and dressing tasks. Pt requiring supervision - steady assist with balance during LB bathing and clothing management when standing. Pt encouraged to stand as much as possible during session but pt declining frequently secondary to reported fatigue. Pt seated in wheelchair with call bell and all needed items within reach upon exiting the room.   Session 2: Upon entering the room, pt seated in wheelchair with no c/o pain and awaiting therapist. OT provided  education regarding skin breakdown and pressure relief with pt verbalizing understanding as he now has stage 2 decubitus ulcer. OT provided paper hand out with descriptions of forward and lateral leans for pressure relief. Timer provided and pt demonstrated the ability to operate correctly. Timer set by pt during session and pt demonstrated lateral lean pressure relief for two minutes with wheelchair set up with min verbal guidance cues. Pt also performing 2 sets of 10 shoulder diagonals and shoulder flexion exercises with use of level 3 resistance theraband. Pt returning to bed and end of session for dialysis with steady assist for stand pivot transfer onto bed. Sit >supine with supervision. Bed alarm activated, call bell and all needed items within reach upon exiting the room.   Therapy Documentation Precautions:  Precautions Precautions: Fall Precaution Comments: Wound vac on R groin  Restrictions Weight Bearing Restrictions: No Vital Signs: Therapy Vitals Temp: 98.5 F (36.9 C) Temp Source: Oral Pulse Rate: 84 Resp: 20 BP: (!) 111/50 mmHg Patient Position (if appropriate): Lying Oxygen Therapy SpO2: 100 % O2 Device: Not Delivered ADL: ADL ADL Comments: refer to FIM  See FIM for current functional status  Therapy/Group: Individual Therapy  [name] 12/09/2014, 8:52 AM

## 2014-12-09 NOTE — Progress Notes (Signed)
ANTICOAGULATION CONSULT NOTE - Follow Up Consult  Pharmacy Consult for coumadin Indication: hx of DVT  Allergies  Allergen Reactions  . Lisinopril Swelling    Lips and tongue swell  . Niacin And Related Other (See Comments)    unknown  . Norvasc [Amlodipine Besylate] Rash    Flushing  . Penicillins Rash    Patient Measurements: Weight: 144 lb 13.5 oz (65.7 kg) Heparin Dosing Weight:   Vital Signs: Temp: 98.5 F (36.9 C) (07/15 0520) Temp Source: Oral (07/15 0520) BP: 111/50 mmHg (07/15 0520) Pulse Rate: 84 (07/15 0520)  Labs:  Recent Labs  12/07/14 0610 12/07/14 1740 12/08/14 0507 12/09/14 0521  HGB  --   --  8.4*  --   HCT  --   --  26.9*  --   PLT  --   --  66*  --   LABPROT 29.8*  --  26.6* 26.4*  INR 2.89*  --  2.49* 2.46*  CREATININE  --  7.37*  --   --     Estimated Creatinine Clearance: 8.8 mL/min (by C-G formula based on Cr of 7.37).   Medications:  Scheduled:  . atorvastatin  40 mg Oral Daily  . azaTHIOprine  75 mg Oral q morning - 10a  . calcium acetate  667 mg Oral BID WC  . carvedilol  6.25 mg Oral QHS  . colesevelam  3,750 mg Oral Daily  . darbepoetin (ARANESP) injection - DIALYSIS  200 mcg Intravenous Q Wed-HD  . doxercalciferol  4 mcg Intravenous Q M,W,F-HD  . famotidine  10 mg Oral QHS  . feeding supplement (PRO-STAT SUGAR FREE 64)  30 mL Oral BID  . feeding supplement  1 Container Oral BID BM  . insulin aspart  0-5 Units Subcutaneous QHS  . isosorbide dinitrate  40 mg Oral 3 times per day  . multivitamin  1 tablet Oral QHS  . pantoprazole  40 mg Oral Daily  . predniSONE  5 mg Oral Q breakfast  . senna-docusate  3 tablet Oral BID  . Sirolimus  1.5 mg Oral Daily  . Warfarin - Pharmacist Dosing Inpatient   Does not apply q1800   Infusions:    Assessment: 69 yo male with hx of DVT is currently on therapeutic coumadin.  INR today is 2.46 from 2.49.  Of note, dose may have been missed on 07/13.   Goal of Therapy:  INR 2-3 Monitor  platelets by anticoagulation protocol: Yes   Plan:  - Warfarin 5 mg x 1  - Monitor daily  INR, CBC, s/s of bleeding  Shelsie Tijerino, Tsz-Yin 12/09/2014,8:38 AM

## 2014-12-09 NOTE — Progress Notes (Signed)
Physical Therapy Session Note  Patient Details  Name: Brandon Sharp MRN: [id number] Date of Birth: 1945/07/16  Today's Date: 12/09/2014 PT Individual Time: 1000-1100 PT Individual Time Calculation (min): 60 min   Short Term Goals: Week 1:  PT Short Term Goal 1 (Week 1): STG =LTG  Skilled Therapeutic Interventions/Progress Updates:   Pt received sitting in w/c in room.  Renal MD in room during session to discuss progress and assess.  Note that pt with swelling in ankles and MD suggested keeping BLEs elevated, therefore during session changed leg rests to elevating to decrease swelling.  Pt self propelled during session x 150' x 1 and another 200' x 1 with BUEs/LEs intermittently for overall strengthening and activity tolerance at S level with min cues for improved technique.  Once in therapy gym, performed two bouts of gait with use of SPC to further challenge balance and increase BLE strength.  Performed 120' x 2 reps at min/guard to min A level.  No overt LOB, however requires intermittent cues for posture, upright head, and sequencing with cane.  Performed seated nustep x 8 mins at level 3 resistance with BLEs only to increase BLE strength and encourage reciprocal pattern to carry over to gait.  Tolerated well with intermittent rest breaks.  Ended session with propulsion to w/c room as above with increased rest breaks due to fatigue and increased time required for PT to adjust leg rests appropriately.  Assisted remainder of distance to room and left in w/c with all needs in reach.   Therapy Documentation Precautions:  Precautions Precautions: Fall Precaution Comments: Wound vac on R groin  Restrictions Weight Bearing Restrictions: No  Pain: Pain Assessment Pain Assessment: No/denies pain Pain Score: 0-No pain   Locomotion : Ambulation Ambulation/Gait Assistance: 4: Min guard;4: Min Financial controller Distance: 300   See FIM for current functional status  Therapy/Group:  Individual Therapy  Denice Bors 12/09/2014, 1:24 PM

## 2014-12-09 NOTE — Progress Notes (Signed)
  [facility] Progress Note  Subjective  - Asked to evaluate right groin wound. Per patient, wound VAC has not been changed since 11/23/14. Reports pain in right lower extremity is improved.    Objective Filed Vitals:   12/09/14 1700  BP: 122/52  Pulse: 93  Temp:   Resp: 18    Intake/Output Summary (Last 24 hours) at 12/09/14 1727 Last data filed at 12/09/14 1200  Gross per 24 hour  Intake    720 ml  Output      0 ml  Net    720 ml   Right groin wound vac dressing removed. Yellow slough to wound bed with good bleeding to skin edges. No odor.  Wound measures 2 x 6x .25 cm deep.   Right lower foot without ischemic changes.   Assessment/Planning: 69 y.o. male is s/p: R groin hematoma evacuation and  VAC placement 11/21/14, s/p R thigh AVG 10/20/14  Wound VAC dressing patient's right groin with date of 11/23/14. This is consistent with patient reporting his wound VAC dressing has not been changed.   Dressing taken down. Incision is clean. Minimal output from in [facility] cannister. Wound improved from previous measurements on 11/23/14. Discontinue VAC for now. Wet to dry NS dressings twice daily to right groin. Will check on wound over the weekend.   Patient still electing for observation versus bypass surgery versus ligation of right thigh graft for rest pain right foot.   Alvia Grove 12/09/2014 5:27 PM --  Laboratory CBC    Component Value Date/Time   WBC 3.4* 12/08/2014 0507   WBC 4.5 02/13/2012 1314   HGB 8.4* 12/08/2014 0507   HGB 11.6* 02/13/2012 1314   HCT 26.9* 12/08/2014 0507   HCT 36.1* 02/13/2012 1314   PLT 66* 12/08/2014 0507   PLT 76* 02/13/2012 1314    BMET    Component Value Date/Time   NA 135 12/09/2014 1517   K 3.2* 12/09/2014 1517   CL 101 12/09/2014 1517   CO2 24 12/09/2014 1517   GLUCOSE 145* 12/09/2014 1517   BUN 23* 12/09/2014 1517   CREATININE 6.54* 12/09/2014 1517   CALCIUM 9.5 12/09/2014 1517   GFRNONAA 8* 12/09/2014 1517    GFRAA 9* 12/09/2014 1517    COAG Lab Results  Component Value Date   INR 2.46* 12/09/2014   INR 2.49* 12/08/2014   INR 2.89* 12/07/2014   No results found for: PTT  Antibiotics Anti-infectives    None       Virgina Jock, PA-C [facility] Office: [phone] Pager: [phone] 12/09/2014 5:27 PM

## 2014-12-09 NOTE — Progress Notes (Signed)
Assessment: 1. ESRD - MWF HD - no heparin with HD since on anticoagulation.   AVF in LUE appears to be functioning although clotted in May 2016.  Will attempt to use today. 2. Vol excess - edema improved, wt down 68kg, gradually lower a bit more as tolerated on HD 3. Dehisced R thigh AVG wound w hematoma , s/p hematoma evacuation + wound VAC placement 6/27. Off abx, cx's neg. 4. RLE arterial insufficiency - s/p angio / per Dr.Chen- not likely to be responsive to endovascular intervention- Plan for now is observation 5. Anemia - due to AVG bleed / CKD Anemia/ and immunosuppression meds- s/p transfusions on 6/22 (2U) and 7/3 (1U). On max ESA/ sp Fe load 6. HTN on coreg 6.25 qhs and tid Isordil 40mg .  7. Metabolic bone disease - Hectorol 4. Phos 4. Reduced ca acetate BID with meals this admission 8. 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 - INR 2.39  9. CAD / remote CABG / heart transplant 2001- po meds Imuran, Rapamune, Prednisone 10. Dm type 2- per primary service 11. Malnutrition - alb mid 2's - renal diet/vits/supplements 12. Thrombocytopenia - increases bleeding risk 13. Disposition - rehab for 2 more weeks per pt   Objective: Vital signs in last 24 hours: Temp:  [97.7 F (36.5 C)-98.5 F (36.9 C)] 98.5 F (36.9 C) (07/15 0520) Pulse Rate:  [84-93] 84 (07/15 0520) Resp:  [18-20] 20 (07/15 0520) BP: (111-140)/(50-58) 111/50 mmHg (07/15 0520) SpO2:  [100 %] 100 % (07/15 0520) Weight:  [65.7 kg (144 lb 13.5 oz)] 65.7 kg (144 lb 13.5 oz) (07/15 0520) Weight change: -3.7 kg (-8 lb 2.5 oz)  Intake/Output from previous day: 07/14 0701 - 07/15 0700 In: 720 [P.O.:720] Out: -  Intake/Output this shift: Total I/O In: 240 [P.O.:240] Out: -   General appearance: alert and cooperative Extremities: RUE AV access  Lab Results:  Recent Labs  12/08/14 0507  WBC 3.4*  HGB 8.4*  HCT 26.9*  PLT 66*   BMET:  Recent Labs  12/07/14 1740  NA 134*  K 3.8  CL 100*  CO2 25   GLUCOSE 100*  BUN 29*  CREATININE 7.37*  CALCIUM 9.3   No results for input(s): PTH in the last 72 hours. Iron Studies: No results for input(s): IRON, TIBC, TRANSFERRIN, FERRITIN in the last 72 hours. Studies/Results: No results found.  Scheduled: . atorvastatin  40 mg Oral Daily  . azaTHIOprine  75 mg Oral q morning - 10a  . calcium acetate  667 mg Oral BID WC  . carvedilol  6.25 mg Oral QHS  . colesevelam  3,750 mg Oral Daily  . darbepoetin (ARANESP) injection - DIALYSIS  200 mcg Intravenous Q Wed-HD  . doxercalciferol  4 mcg Intravenous Q M,W,F-HD  . famotidine  10 mg Oral QHS  . feeding supplement  1 Container Oral BID WC  . feeding supplement (PRO-STAT SUGAR FREE 64)  30 mL Oral BID  . insulin aspart  0-5 Units Subcutaneous QHS  . isosorbide dinitrate  40 mg Oral 3 times per day  . multivitamin  1 tablet Oral QHS  . pantoprazole  40 mg Oral Daily  . predniSONE  5 mg Oral Q breakfast  . senna-docusate  3 tablet Oral BID  . Sirolimus  1.5 mg Oral Daily  . warfarin  5 mg Oral ONCE-1800  . Warfarin - Pharmacist Dosing Inpatient   Does not apply q1800     LOS: 7 days   Cobie Leidner  C 12/09/2014,12:32 PM

## 2014-12-09 NOTE — Patient Care Conference (Signed)
Inpatient RehabilitationTeam Conference and Plan of Care Update Date: 12/07/2014   Time: 10:55 AM    Patient Name: Brandon Sharp      Medical Record Number: [id number]  Date of Birth: 03/28/1946 Sex: Male         Room/Bed: 4W26C/4W26C-01 Payor Info: Payor: MEDICARE / Plan: MEDICARE PART A AND B / Product Type: *No Product type* /    Admitting Diagnosis: ESRD HD  Admit Date/Time:  12/02/2014  8:05 PM Admission Comments: No comment available   Primary Diagnosis:  Physical deconditioning Principal Problem: Physical deconditioning  Patient Active Problem List   Diagnosis Date Noted  . Hematoma of right thigh 12/03/2014  . Physical deconditioning 12/02/2014  . Symptomatic anemia 11/16/2014  . ESRD (end stage renal disease) on dialysis 10/20/2014  . Thrombocytopenia due to drugs 09/29/2014  . Drug-induced leukopenia 09/29/2014  . Thrombocytopenia 08/26/2014  . Cellulitis 08/25/2014  . Anemia in chronic illness 08/25/2014  . Cellulitis of left upper extremity   . End stage renal disease on dialysis   . Protein-calorie malnutrition, severe 05/06/2014  . FUO (fever of unknown origin) 05/05/2014  . S/P bronchoscopy with biopsy   . Fever   . Healthcare associated bacterial pneumonia 04/11/2014  . Immunosuppressed status   . Kidney transplant failure   . Floaters in visual field   . Fever, unknown origin 04/06/2014  . Fever of undetermined origin 04/06/2014  . Sepsis 03/30/2014  . Blood poisoning   . ESRD on dialysis   . Left lower lobe pneumonia   . Personal history of colonic polyps 12/14/2013  . HCAP (healthcare-associated pneumonia) 07/24/2013  . ESRD on hemodialysis 07/24/2013  . Diarrhea 07/24/2013  . Generalized weakness 07/24/2013  . Pain in limb- Left leg 11/24/2012  . Atherosclerosis of native arteries of the extremities with intermittent claudication 11/24/2012  . Fever of unknown origin (FUO) 04/16/2011  . HTN (hypertension), malignant 04/16/2011  . DM (diabetes  mellitus), type 2 with renal complications 63/89/3734  . CKD (chronic kidney disease), stage V 04/16/2011  . DVT (deep venous thrombosis) 04/15/2011  . Coagulopathy 04/15/2011  . Heart transplanted 04/15/2011  . Renal transplant failure and rejection 04/15/2011  . Ventricular fibrillation 04/15/2011    Class: History of  . S/P CABG (coronary artery bypass graft) 04/15/2011    Class: History of  . Dyslipidemia 04/15/2011    Expected Discharge Date: Expected Discharge Date: 12/14/14  Team Members Present: Physician leading conference: Dr. Alysia Penna Social Worker Present: Alfonse Alpers, LCSW Nurse Present: Heather Roberts, RN PT Present: Georjean Mode, PT [facility] [place], [place]) OT Present: Meriel Pica, OT;Katie Abner Greenspan, Jules Schick, OT SLP Present: Gunnar Fusi, SLP PPS Coordinator present : Daiva Nakayama, RN, CRRN     Current Status/Progress Goal Weekly Team Focus  Medical   Seems a little more distracted in physical therapy today.  No cognitive deficits noted overall. Speech is not following. Dialysis ongoing  Home with family assistance, maintaining medical stability  Address need for wound VAC versus wet to dry dressing and appropriate family training   Bowel/Bladder   patient is continent of bowel and bladder (oliguric)  remain continent of bowel & bladder  educate patient about the s/s of constipation   Swallow/Nutrition/ Hydration             ADL's   min assist transfers with RW and SPC, supevision bathing and dressing tasks  Mod I overall  activity tolerance, functional mobility, transfers, pt/family education   Mobility   supervision transfers,  min assist short distance ambulation, increased time supervision wheelchair mobility, min assist stairs  mod I transfers and household ambulation, supervision stairs  endurance, postural and LE strength, stairs.   Communication             Safety/Cognition/ Behavioral Observations            Pain   patient denies  pain/disconfort  pain less than or equal to 4 on a scale of 0  assess pain q4h, medicate if indicated   Skin   Stage II to mid buttocks, present on admission to rehab  no futher skin injury/breakdown  assess skin q shift, maintian foam dressing, offload pressure points    Rehab Goals Patient on target to meet rehab goals: Yes Rehab Goals Revised: none *See Care Plan and progress notes for long and short-term goals.  Barriers to Discharge: See above    Possible Resolutions to Barriers:  Ask vascular surgery to re-eval  groin wound    Discharge Planning/Teaching Needs:  Pt to return to his home with his wife available 24/7.  Wife to attend family education sessions on 12-12-14.   Team Discussion:  Pt with heart transplant in 2001, now on HD for ESRD.  Pt did better with PT earlier in stay than recently.  Did ADLs with nursing, so OT is not sure how he will do with those, but balance is poor.  Team believes pt was not doing much activity at home PTA.  Revisions to Treatment Plan:  none   Continued Need for Acute Rehabilitation Level of Care: The patient requires daily medical management by a physician with specialized training in physical medicine and rehabilitation for the following conditions: Daily direction of a multidisciplinary physical rehabilitation program to ensure safe treatment while eliciting the highest outcome that is of practical value to the patient.: Yes Daily medical management of patient stability for increased activity during participation in an intensive rehabilitation regime.: Yes Daily analysis of laboratory values and/or radiology reports with any subsequent need for medication adjustment of medical intervention for : Other;Post surgical problems;Cardiac problems  Victoriah Wilds, Silvestre Mesi 12/09/2014, 9:16 PM

## 2014-12-09 NOTE — Progress Notes (Signed)
[facility] Progress Note  Assessment/Plan: 1. ESRD - MWF HD - no heparin with HD since on anticoagulation 2. Vol excess - edema improved, wt down 68kg, gradually lower a bit more as tolerated on HD. For HD today. Trace LE edema. Wt: 65.7 kg. BP controlled.  3. Dehisced R thigh AVG wound w hematoma , s/p hematoma evacuation + wound VAC placement 6/27. Off abx, cx's neg. 4. RLE arterial insufficiency - s/p angio / per Dr.Chen- not likely to be responsive to endovascular intervention- Plan for now is observation 5. Anemia - due to AVG bleed / CKD Anemia/ and immunosuppression meds- s/p transfusions on 6/22 (2U) and 7/3 (1U). On max ESA/ sp Fe load. HGB 8.4 today. Follow CBC.  6. HTN on coreg 6.25 qhs and tid Isordil 40mg . BP 111/50-140/58.  7. Metabolic bone disease - Hectorol 4. Phos 4. Reduced ca acetate BID with meals this admission 8. 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 - INR 2.39  9. CAD / remote CABG / heart transplant 2001- po meds Imuran, Rapamune, Prednisone 10. Dm type 2- per primary service 11. Malnutrition - alb mid 2's - renal diet/vits/supplements 12. Thrombocytopenia - increases bleeding risk Disposition - rehab for 2 more weeks per pt   Rita H. Brown NP-C 12/09/2014, 10:15 AM  [facility] [phone]  Renal Attending: Agree with note and eval and plan as articulated above. Wynton Hufstetler C   Subjective:  "I feel good today". Up in [facility] with PT. No C/Os.   Objective Filed Vitals:   12/08/14 0614 12/08/14 0655 12/08/14 1422 12/09/14 0520  BP: 121/40  140/58 111/50  Pulse: 85  93 84  Temp: 98.1 F (36.7 C)  97.7 F (36.5 C) 98.5 F (36.9 C)  TempSrc: Oral  Oral Oral  Resp: 19  18 20   Weight:  67.9 kg (149 lb 11.1 oz)  65.7 kg (144 lb 13.5 oz)  SpO2: 98%  100% 100%   Physical Exam General: Pleasant, NAD Heart: S1, S2, RRR. No M/G/R Lungs: Bilateral breath sounds CTA Abdomen: Abdomen soft active BS. Nontender.   Extremities: Wound vac to R. Femoral area. Trace LE edema. Bilateral UE edema.  Dialysis Access: R. Femoral AVG-wound vac. L perm cath.   NW MWF 4 hours 72kg 2/2 bath 4hr Heparin 3000 (no hep at dc, on coumadin) Hectorol 4  Aranesp 200 q Wed no Fe  Additional Objective Labs: Basic Metabolic Panel:  Recent Labs Lab 12/02/14 1554 12/05/14 1937 12/07/14 1740  NA 136 136 134*  K 3.3* 3.5 3.8  CL 100* 99* 100*  CO2 26 27 25   GLUCOSE 123* 102* 100*  BUN 27* 8 29*  CREATININE 7.42* 3.05* 7.37*  CALCIUM 9.8 8.2* 9.3  PHOS 2.8 2.0* 3.8   Liver Function Tests:  Recent Labs Lab 12/02/14 1554 12/05/14 1937 12/07/14 1740  ALBUMIN 2.6* 3.1* 2.8*   No results for input(s): LIPASE, AMYLASE in the last 168 hours. CBC:  Recent Labs Lab 12/03/14 0504 12/04/14 0512 12/05/14 0528 12/06/14 0648 12/08/14 0507  WBC 5.4 4.5 3.8* 3.9* 3.4*  HGB 9.8* 8.5* 8.0* 9.0* 8.4*  HCT 30.8* 26.9* 25.2* 28.9* 26.9*  MCV 91.1 92.4 92.0 92.9 92.4  PLT 80* 76* 71* 82* 66*   Blood Culture    Component Value Date/Time   SDES WOUND GROIN RIGHT 11/21/2014 1007   SDES WOUND GROIN RIGHT 11/21/2014 1007   SPECREQUEST RIGHT GROIN HEMATOMA PT ON VANCOMYCIN 11/21/2014 1007   SPECREQUEST RIGHT GROIN HEMATOMA PT  ON VANCOMYCIN 11/21/2014 1007   CULT  11/21/2014 1007    NO ANAEROBES ISOLATED Performed at [place]  11/21/2014 1007    NO GROWTH 3 DAYS Performed at [place] 11/26/2014 FINAL 11/21/2014 1007   REPTSTATUS 11/24/2014 FINAL 11/21/2014 1007    Cardiac Enzymes: No results for input(s): CKTOTAL, CKMB, CKMBINDEX, TROPONINI in the last 168 hours. CBG:  Recent Labs Lab 12/08/14 0650 12/08/14 1142 12/08/14 1701 12/08/14 2118 12/09/14 0627  GLUCAP 85 129* 128* 126* 86   Iron Studies: No results for input(s): IRON, TIBC, TRANSFERRIN, FERRITIN in the last 72 hours. @lablastinr3 @ Studies/Results: No results found. Medications:   .  atorvastatin  40 mg Oral Daily  . azaTHIOprine  75 mg Oral q morning - 10a  . calcium acetate  667 mg Oral BID WC  . carvedilol  6.25 mg Oral QHS  . colesevelam  3,750 mg Oral Daily  . darbepoetin (ARANESP) injection - DIALYSIS  200 mcg Intravenous Q Wed-HD  . doxercalciferol  4 mcg Intravenous Q M,W,F-HD  . famotidine  10 mg Oral QHS  . feeding supplement  1 Container Oral BID WC  . feeding supplement (PRO-STAT SUGAR FREE 64)  30 mL Oral BID  . insulin aspart  0-5 Units Subcutaneous QHS  . isosorbide dinitrate  40 mg Oral 3 times per day  . multivitamin  1 tablet Oral QHS  . pantoprazole  40 mg Oral Daily  . predniSONE  5 mg Oral Q breakfast  . senna-docusate  3 tablet Oral BID  . Sirolimus  1.5 mg Oral Daily  . warfarin  5 mg Oral ONCE-1800  . Warfarin - Pharmacist Dosing Inpatient   Does not apply [phone]

## 2014-12-09 NOTE — Progress Notes (Signed)
Social Work Patient ID: Brandon Sharp, male   DOB: 03-18-1946, 69 y.o.   MRN: [id number]   CSW met with pt and spoke with his wife via telephone to update them on team conference discussion.  Pt and wife were pleased with d/c date and wife will come for family training on 12-12-14.  Both of them asked about pt's dialysis since he is scheduled for that day.  CSW explained that pt will probably go early that morning of 12-14-14 and then can go home after that is complete.  Wife feels comfortable providing 24/7 supervision and CSW will resume pt's home health and wound VAC (if needed).  CSW will continue to follow and assist as needed.

## 2014-12-09 NOTE — Progress Notes (Signed)
Physical Therapy Session Note  Patient Details  Name: Brandon Sharp MRN: [id number] Date of Birth: [birth year]  Today's Date: 12/09/2014 PT Individual Time: 1130-1200 PT Individual Time Calculation (min): 30 min   Short Term Goals: Week 1:  PT Short Term Goal 1 (Week 1): STG =LTG  Skilled Therapeutic Interventions/Progress Updates:   Pt received seated in w/c with no c/o pain and agreeable to treatment. Sit <> stand x3 trials in preparation for gait training due to pt report of lightheadedness upon standing. Pt initially planned to use [facility] for gait training, however reports feeling "a little wobbly" and chose to use RW instead for inc safety. Gait training with RW performed 1 trial x130 ft with supervision. Requires inc time to perform due to slow speed. Performed LE strengthening exercises including sit <>stand x10 reps, seated hip flexion marching with 3lb weighted bar on knee 1 x 15 BLE, 1 x15 long arc quad BLE. Pt remained seated in w/c at  Completion of session with all needs within reach.   Therapy Documentation Precautions:  Precautions Precautions: Fall Precaution Comments: Wound vac on R groin  Restrictions Weight Bearing Restrictions: No Pain: Pain Assessment Pain Assessment: No/denies pain Pain Score: 0-No pain Locomotion : Ambulation Ambulation/Gait Assistance: 5: Supervision   See FIM for current functional status  Therapy/Group: Individual Therapy  Luberta Mutter 12/09/2014, 11:56 AM

## 2014-12-09 NOTE — Progress Notes (Signed)
Social Work Patient ID: Brandon Sharp, male   DOB: [birth year], 69 y.o.   MRN: [id number]   Lynnda Child, LCSW Social Worker Signed  Patient Care Conference 12/09/2014  9:16 PM    Expand All Collapse All   Inpatient RehabilitationTeam Conference and Plan of Care Update Date: 12/07/2014   Time: 10:55 AM     Patient Name: Brandon Sharp       Medical Record Number: [id number]  Date of Birth: [birth year] Sex: Male         Room/Bed: 4W26C/4W26C-01 Payor Info: Payor: MEDICARE / Plan: MEDICARE PART A AND B / Product Type: *No Product type* /    Admitting Diagnosis: ESRD HD   Admit Date/Time:  12/02/2014  8:05 PM Admission Comments: No comment available   Primary Diagnosis:  Physical deconditioning Principal Problem: Physical deconditioning    Patient Active Problem List     Diagnosis  Date Noted   .  Hematoma of right thigh  12/03/2014   .  Physical deconditioning  12/02/2014   .  Symptomatic anemia  11/16/2014   .  ESRD (end stage renal disease) on dialysis  10/20/2014   .  Thrombocytopenia due to drugs  09/29/2014   .  Drug-induced leukopenia  09/29/2014   .  Thrombocytopenia  08/26/2014   .  Cellulitis  08/25/2014   .  Anemia in chronic illness  08/25/2014   .  Cellulitis of left upper extremity     .  End stage renal disease on dialysis     .  Protein-calorie malnutrition, severe  05/06/2014   .  FUO (fever of unknown origin)  05/05/2014   .  S/P bronchoscopy with biopsy     .  Fever     .  Healthcare associated bacterial pneumonia  04/11/2014   .  Immunosuppressed status     .  Kidney transplant failure     .  Floaters in visual field     .  Fever, unknown origin  04/06/2014   .  Fever of undetermined origin  04/06/2014   .  Sepsis  03/30/2014   .  Blood poisoning     .  ESRD on dialysis     .  Left lower lobe pneumonia     .  Personal history of colonic polyps  12/14/2013   .  HCAP (healthcare-associated pneumonia)  07/24/2013   .  ESRD on hemodialysis   07/24/2013   .  Diarrhea  07/24/2013   .  Generalized weakness  07/24/2013   .  Pain in limb- Left leg  11/24/2012   .  Atherosclerosis of native arteries of the extremities with intermittent claudication  11/24/2012   .  Fever of unknown origin (FUO)  04/16/2011   .  HTN (hypertension), malignant  04/16/2011   .  DM (diabetes mellitus), type 2 with renal complications  09/32/3557   .  CKD (chronic kidney disease), stage V  04/16/2011   .  DVT (deep venous thrombosis)  04/15/2011   .  Coagulopathy  04/15/2011   .  Heart transplanted  04/15/2011   .  Renal transplant failure and rejection  04/15/2011   .  Ventricular fibrillation  04/15/2011       Class: History of   .  S/P CABG (coronary artery bypass graft)  04/15/2011       Class: History of   .  Dyslipidemia  04/15/2011     Expected Discharge Date: Expected Discharge Date:  12/14/14  Team Members Present: Physician leading conference: Dr. Alysia Penna Social Worker Present: Alfonse Alpers, LCSW Nurse Present: Heather Roberts, RN PT Present: Georjean Mode, PT [facility] [place], [place]) OT Present: Meriel Pica, OT;Katie Abner Greenspan, Jules Schick, OT SLP Present: Gunnar Fusi, SLP PPS Coordinator present : Daiva Nakayama, RN, CRRN        Current Status/Progress  Goal  Weekly Team Focus   Medical     Seems a little more distracted in physical therapy today.  No cognitive deficits noted overall. Speech is not following. Dialysis ongoing  Home with family assistance, maintaining medical stability   Address need for wound VAC versus wet to dry dressing and appropriate family training   Bowel/Bladder     patient is continent of bowel and bladder (oliguric)   remain continent of bowel & bladder   educate patient about the s/s of constipation   Swallow/Nutrition/ Hydration               ADL's     min assist transfers with RW and SPC, supevision bathing and dressing tasks  Mod I overall  activity tolerance, functional mobility, transfers,  pt/family education   Mobility     supervision transfers, min assist short distance ambulation, increased time supervision wheelchair mobility, min assist stairs   mod I transfers and household ambulation, supervision stairs   endurance, postural and LE strength, stairs.    Communication               Safety/Cognition/ Behavioral Observations              Pain     patient denies pain/disconfort  pain less than or equal to 4 on a scale of 0  assess pain q4h, medicate if indicated    Skin     Stage II to mid buttocks, present on admission to rehab   no futher skin injury/breakdown  assess skin q shift, maintian foam dressing, offload pressure points    Rehab Goals Patient on target to meet rehab goals: Yes Rehab Goals Revised: none *See Care Plan and progress notes for long and short-term goals.    Barriers to Discharge:  See above     Possible Resolutions to Barriers:   Ask vascular surgery to re-eval  groin wound      Discharge Planning/Teaching Needs:   Pt to return to his home with his wife available 24/7.   Wife to attend family education sessions on 12-12-14.    Team Discussion:    Pt with heart transplant in 2001, now on HD for ESRD.  Pt did better with PT earlier in stay than recently.  Did ADLs with nursing, so OT is not sure how he will do with those, but balance is poor.  Team believes pt was not doing much activity at home PTA.   Revisions to Treatment Plan:    none    Continued Need for Acute Rehabilitation Level of Care: The patient requires daily medical management by a physician with specialized training in physical medicine and rehabilitation for the following conditions: Daily direction of a multidisciplinary physical rehabilitation program to ensure safe treatment while eliciting the highest outcome that is of practical value to the patient.: Yes Daily medical management of patient stability for increased activity during participation in an intensive  rehabilitation regime.: Yes Daily analysis of laboratory values and/or radiology reports with any subsequent need for medication adjustment of medical intervention for : Other;Post surgical problems;Cardiac problems  Azlee Monforte, Silvestre Mesi 12/09/2014, 9:16  PM

## 2014-12-10 ENCOUNTER — Inpatient Hospital Stay (HOSPITAL_COMMUNITY): Payer: Medicare Other | Admitting: Occupational Therapy

## 2014-12-10 LAB — CBC
HEMATOCRIT: 26.6 % — AB (ref 39.0–52.0)
HEMOGLOBIN: 8.4 g/dL — AB (ref 13.0–17.0)
MCH: 29.5 pg (ref 26.0–34.0)
MCHC: 31.6 g/dL (ref 30.0–36.0)
MCV: 93.3 fL (ref 78.0–100.0)
Platelets: 76 10*3/uL — ABNORMAL LOW (ref 150–400)
RBC: 2.85 MIL/uL — ABNORMAL LOW (ref 4.22–5.81)
RDW: 18.9 % — ABNORMAL HIGH (ref 11.5–15.5)
WBC: 4.2 10*3/uL (ref 4.0–10.5)

## 2014-12-10 LAB — GLUCOSE, CAPILLARY
GLUCOSE-CAPILLARY: 101 mg/dL — AB (ref 65–99)
Glucose-Capillary: 103 mg/dL — ABNORMAL HIGH (ref 65–99)
Glucose-Capillary: 126 mg/dL — ABNORMAL HIGH (ref 65–99)
Glucose-Capillary: 126 mg/dL — ABNORMAL HIGH (ref 65–99)

## 2014-12-10 LAB — PROTIME-INR
INR: 2.49 — AB (ref 0.00–1.49)
Prothrombin Time: 26.6 seconds — ABNORMAL HIGH (ref 11.6–15.2)

## 2014-12-10 MED ORDER — WARFARIN SODIUM 5 MG PO TABS
5.0000 mg | ORAL_TABLET | Freq: Once | ORAL | Status: AC
  Administered 2014-12-10: 5 mg via ORAL

## 2014-12-10 NOTE — Progress Notes (Signed)
ANTICOAGULATION CONSULT NOTE - Follow Up Consult  Pharmacy Consult for coumadin Indication: hx of DVT  Allergies  Allergen Reactions  . Lisinopril Swelling    Lips and tongue swell  . Niacin And Related Other (See Comments)    unknown  . Norvasc [Amlodipine Besylate] Rash    Flushing  . Penicillins Rash   Patient Measurements: Weight: 140 lb 3.4 oz (63.6 kg)  Vital Signs: Temp: 98.1 F (36.7 C) (07/16 0500) Temp Source: Oral (07/16 0500) BP: 110/42 mmHg (07/16 0500) Pulse Rate: 77 (07/16 0500)  Labs:  Recent Labs  12/07/14 1740 12/08/14 0507 12/09/14 0521 12/09/14 1517 12/10/14 0517  HGB  --  8.4*  --   --  8.4*  HCT  --  26.9*  --   --  26.6*  PLT  --  66*  --   --  76*  LABPROT  --  26.6* 26.4*  --  26.6*  INR  --  2.49* 2.46*  --  2.49*  CREATININE 7.37*  --   --  6.54*  --     Estimated Creatinine Clearance: 9.6 mL/min (by C-G formula based on Cr of 6.54).  Assessment: 69 yo male with hx of DVT is currently on therapeutic coumadin.  INR today is 2.49.  Of note, dose may have been missed on 07/13.    Goal of Therapy:  INR 2-3   Plan:  - Repeat Warfarin 5 mg PO x 1  - Monitor daily  INR, CBC, s/s of bleeding  Jaydynn Wolford, Rande Lawman 12/10/2014,8:59 AM

## 2014-12-10 NOTE — Progress Notes (Signed)
Brandon Sharp is a 69 y.o. male 1946-01-22 [id number]  Subjective: No new complaints. No new problems. Resting in bed. Feeling OK.  Objective: Vital signs in last 24 hours: Temp:  [97.5 F (36.4 C)-98.1 F (36.7 C)] 98.1 F (36.7 C) (07/16 0500) Pulse Rate:  [77-94] 77 (07/16 0500) Resp:  [14-20] 18 (07/16 0500) BP: (110-153)/(42-65) 110/42 mmHg (07/16 0500) SpO2:  [100 %] 100 % (07/16 0500) Weight:  [140 lb 3.4 oz (63.6 kg)-149 lb 4 oz (67.7 kg)] 140 lb 3.4 oz (63.6 kg) (07/16 0500) Weight change: 4 lb 6.5 oz (2 kg) Last BM Date: 12/07/14  Intake/Output from previous day: 07/15 0701 - 07/16 0700 In: 480 [P.O.:480] Out: 2400  Last cbgs: CBG (last 3)   Recent Labs  12/09/14 0627 12/09/14 2124 12/10/14 0707  GLUCAP 86 121* 101*     Physical Exam General: No apparent distress   HEENT: not dry Lungs: Normal effort. Lungs clear to auscultation, no crackles or wheezes. Cardiovascular: Regular rate and rhythm, no edema Abdomen: S/NT/ND; BS(+) Musculoskeletal:  unchanged Neurological: No new neurological deficits Wounds: N/A    Skin: clear  Aging changes Mental state: Alert, oriented, cooperative    Lab Results: BMET    Component Value Date/Time   NA 135 12/09/2014 1517   K 3.2* 12/09/2014 1517   CL 101 12/09/2014 1517   CO2 24 12/09/2014 1517   GLUCOSE 145* 12/09/2014 1517   BUN 23* 12/09/2014 1517   CREATININE 6.54* 12/09/2014 1517   CALCIUM 9.5 12/09/2014 1517   GFRNONAA 8* 12/09/2014 1517   GFRAA 9* 12/09/2014 1517   CBC    Component Value Date/Time   WBC 4.2 12/10/2014 0517   WBC 4.5 02/13/2012 1314   RBC 2.85* 12/10/2014 0517   RBC 2.15* 11/16/2014 1226   RBC 3.21* 02/13/2012 1314   HGB 8.4* 12/10/2014 0517   HGB 11.6* 02/13/2012 1314   HCT 26.6* 12/10/2014 0517   HCT 36.1* 02/13/2012 1314   PLT 76* 12/10/2014 0517   PLT 76* 02/13/2012 1314   MCV 93.3 12/10/2014 0517   MCV 112.6* 02/13/2012 1314   MCH 29.5 12/10/2014 0517   MCH 36.3*  02/13/2012 1314   MCHC 31.6 12/10/2014 0517   MCHC 32.3 02/13/2012 1314   RDW 18.9* 12/10/2014 0517   RDW 15.7* 02/13/2012 1314   LYMPHSABS 1.8 11/16/2014 1157   LYMPHSABS 0.8* 02/13/2012 1314   MONOABS 0.4 11/16/2014 1157   MONOABS 0.6 02/13/2012 1314   EOSABS 0.1 11/16/2014 1157   EOSABS 0.1 02/13/2012 1314   BASOSABS 0.0 11/16/2014 1157   BASOSABS 0.0 02/13/2012 1314    Studies/Results: No results found.  Medications: I have reviewed the patient's current medications.  Assessment/Plan:  1. Functional deficits secondary to R thigh hematoma, PAD with subsequent weakness/ gait instability 2. DVT rx (March 2016): coumadin therapeutic,  -serial INR's and dose adjustment--appreciate pharmacy assist 3. Pain Management: continue oxycodone and observe for pain tolerance with activity   4. Mood: LCSW to follow for evaluation and support. 5. Neuropsych: This patient is capable of making decisions on his own behalf. 6. Skin/Wound Care: Continue VAC right thigh at all times -change MWF per [place] 7. Fluids/Electrolytes/Nutrition: Monitor I/O. Labs with HD, po fluid ~769ml on 7/14 8. DM type2 : Monitor BS ac/hs for pattern--CBGs at goal today, but on lower side Cont to monitor 9. Cardiac transplant: On imuran and sirolimus.  10. ESRD: HD on MWF after therapy sessions to avoid conflict with therapy times- limited draw off  of fluid due to hypotension , nephro to manage 11. Constipation: bowel regimen.No incont , 2 BMs recorded yesterday -adjust softener/laxative    Length of stay, days: 8  Walker Kehr , MD 12/10/2014, 9:46 AM

## 2014-12-10 NOTE — Progress Notes (Signed)
Occupational Therapy Session Note  Patient Details  Name: Brandon Sharp MRN: [id number] Date of Birth: 03/23/1946  Today's Date: 12/10/2014 OT Individual Time: 1300-1330 OT Individual Time Calculation (min): 30 min    Short Term Goals: Week 2:  OT Short Term Goal 1 (Week 2): STG=LTGs secondary to upcoming discharge  Skilled Therapeutic Interventions/Progress Updates:  Upon entering the room, pt seated in wheelchair awaiting therapist. Pt with no c/o pain. Pt reporting he had been performing pressure relief prior to therapist arrival. He was able to state correct time frequency and duration while in chair as well as demonstrate the two types that he had been previously educated on. Pt ambulated with SPC 15 feet from wheelchair to toilet with close supervision. Pt performing toilet transfer onto standard toilet with steady assist. Pt no needing to toilet this session and returned to wheelchair in same manner as above. Pt engaged in 2 sets of 10 lateral pulls downs and bicep curls with green, level 3 resistance theraband in order to increase B UE strength. Pt remained seated in wheelchair with call bell and all needed items within reach upon exiting the room.    Therapy Documentation Precautions:  Precautions Precautions: Fall Precaution Comments: Wound vac on R groin  Restrictions Weight Bearing Restrictions: No Vital Signs: Therapy Vitals Temp: 97.9 F (36.6 C) Temp Source: Oral Pulse Rate: 86 Resp: 18 BP: (!) 103/55 mmHg Patient Position (if appropriate): Sitting Oxygen Therapy SpO2: 99 % O2 Device: Not Delivered ADL: ADL ADL Comments: refer to FIM  See FIM for current functional status  Therapy/Group: Individual Therapy  [name] 12/10/2014, 2:43 PM

## 2014-12-10 NOTE — Progress Notes (Signed)
[facility] Progress Note  Assessment/Plan: 1. ESRD - MWF HD -K 3.2 Friday no heparin with HD since on anticoagulation- though he previously was on both.  HD orders written for Monday.   AVF in RUE pulsatile with short bruit, although clotted in May 2016.Pt declined use Friday; agrees to try on Monday HD. 2. Vol excess - edema improved, wt down 65.3kg, net UF 2.4 on Friday- looks like new edw may be 65 3. Dehisced R thigh AVG wound w hematoma , s/p hematoma evacuation and wound VAC Off abx, cx's neg. 4. RLE arterial insufficiency - s/p angio / per Dr.Chen- not likely to be responsive to endovascular intervention- Plan for now is observation 5. Anemia - due to AVG bleed / CKD Anemia/ and immunosuppression meds- s/p transfusions on 6/22 (2U) and 7/3 (1U)prior to rehab admit. On max ESA/ sp Fe load Hgb 8.4 stable 6. HTN controlled on coreg 6.25 qhs and tid Isordil 40mg , new lower EDW 7. Metabolic bone disease - Hectorol 4. Phos 4. Reduced ca acetate BID with meals this admission 8. 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 - INR 2.39  9. CAD / remote CABG / heart transplant 2001- po meds Imuran, Rapamune, Prednisone 10. Dm type 2- per primary service 11. Malnutrition - alb mid 2's - renal diet/vits/supplements 12. Thrombocytopenia - increases bleeding risk 13. Disposition - rehab for 2 more weeks per pt  Myriam Jacobson, PA-C [place] [phone] 12/10/2014,10:43 AM  LOS: 8 days   Renal Attending: Hopefully he will be able to have AV access in RUE cannulated as it is functioning.  We will try EMLA cream to help. Annell Canty C    Subjective:   mp c/o - eating better, eats a sandwich at night  Objective Filed Vitals:   12/09/14 1800 12/09/14 1830 12/09/14 1856 12/10/14 0500  BP: 114/50 114/53 117/65 110/42  Pulse: 94 93 92 77  Temp:   97.5 F (36.4 C) 98.1 F (36.7 C)  TempSrc:   Oral Oral  Resp: 14 16 18 18   Weight:   65.3 kg (143 lb  15.4 oz) 63.6 kg (140 lb 3.4 oz)  SpO2:   100% 100%   Physical Exam General: NADthin  alert and engaging Heart: RRR Lungs: no rales Abdomen: soft Extremities: tr RLE edema Dialysis Access: right upper AVF and left IJ  Dialysis Orders: [facility] MWF 4 hours 72kg 2/2 bath 4hr Heparin 3000 (no hep at dc, on coumadin) Hectorol 4  Aranesp 200 q Wed no Fe  Additional Objective Labs: Basic Metabolic Panel:  Recent Labs Lab 12/05/14 1937 12/07/14 1740 12/09/14 1517  NA 136 134* 135  K 3.5 3.8 3.2*  CL 99* 100* 101  CO2 27 25 24   GLUCOSE 102* 100* 145*  BUN 8 29* 23*  CREATININE 3.05* 7.37* 6.54*  CALCIUM 8.2* 9.3 9.5  PHOS 2.0* 3.8 2.8   Liver Function Tests:  Recent Labs Lab 12/05/14 1937 12/07/14 1740 12/09/14 1517  ALBUMIN 3.1* 2.8* 2.8*   CBC:  Recent Labs Lab 12/04/14 0512 12/05/14 0528 12/06/14 0648 12/08/14 0507 12/10/14 0517  WBC 4.5 3.8* 3.9* 3.4* 4.2  HGB 8.5* 8.0* 9.0* 8.4* 8.4*  HCT 26.9* 25.2* 28.9* 26.9* 26.6*  MCV 92.4 92.0 92.9 92.4 93.3  PLT 76* 71* 82* 66* 76*   CBG:  Recent Labs Lab 12/08/14 1701 12/08/14 2118 12/09/14 0627 12/09/14 2124 12/10/14 0707  GLUCAP 128* 126* 86 121* 101*   Lab Results  Component Value  Date   INR 2.49* 12/10/2014   INR 2.46* 12/09/2014   INR 2.49* 12/08/2014    Medications:   . atorvastatin  40 mg Oral Daily  . azaTHIOprine  75 mg Oral q morning - 10a  . calcium acetate  667 mg Oral BID WC  . carvedilol  6.25 mg Oral QHS  . colesevelam  3,750 mg Oral Daily  . darbepoetin (ARANESP) injection - DIALYSIS  200 mcg Intravenous Q Wed-HD  . doxercalciferol  4 mcg Intravenous Q M,W,F-HD  . famotidine  10 mg Oral QHS  . feeding supplement  1 Container Oral BID WC  . feeding supplement (PRO-STAT SUGAR FREE 64)  30 mL Oral BID  . insulin aspart  0-5 Units Subcutaneous QHS  . isosorbide dinitrate  40 mg Oral 3 times per day  . multivitamin  1 tablet Oral QHS  . pantoprazole  40 mg Oral Daily  .  predniSONE  5 mg Oral Q breakfast  . senna-docusate  3 tablet Oral BID  . Sirolimus  1.5 mg Oral Daily  . warfarin  5 mg Oral ONCE-1800  . Warfarin - Pharmacist Dosing Inpatient   Does not apply [phone]

## 2014-12-11 ENCOUNTER — Inpatient Hospital Stay (HOSPITAL_COMMUNITY): Payer: Medicare Other | Admitting: Physical Therapy

## 2014-12-11 LAB — GLUCOSE, CAPILLARY
GLUCOSE-CAPILLARY: 164 mg/dL — AB (ref 65–99)
Glucose-Capillary: 102 mg/dL — ABNORMAL HIGH (ref 65–99)
Glucose-Capillary: 112 mg/dL — ABNORMAL HIGH (ref 65–99)
Glucose-Capillary: 141 mg/dL — ABNORMAL HIGH (ref 65–99)

## 2014-12-11 LAB — PROTIME-INR
INR: 3.32 — AB (ref 0.00–1.49)
Prothrombin Time: 33 seconds — ABNORMAL HIGH (ref 11.6–15.2)

## 2014-12-11 MED ORDER — LIDOCAINE-PRILOCAINE 2.5-2.5 % EX CREA
TOPICAL_CREAM | Freq: Once | CUTANEOUS | Status: AC
  Administered 2014-12-12: 14:00:00 via TOPICAL
  Filled 2014-12-11: qty 5

## 2014-12-11 NOTE — Progress Notes (Signed)
Physical Therapy Session Note  Patient Details  Name: CHARLTON BOULE MRN: [id number] Date of Birth: [birth year]  Today's Date: 12/11/2014 PT Individual Time: 1330-1400 PT Individual Time Calculation (min): 30 min   Short Term Goals: Week 1:  PT Short Term Goal 1 (Week 1): STG =LTG  Skilled Therapeutic Interventions/Progress Updates:  Pt was seen bedside in the pm. Pt performed multiple sit to stand transfers with straight cane and min A with verbal cues. Pt ambulated with straight cane and min A about 160 feet with slow cadence and fluctuating step length. Pt performed 3 sets x 10 reps each marching in place for LE strengthening. Pt left sitting in recliner with call bell within reach.   Therapy Documentation Precautions:  Precautions Precautions: Fall Precaution Comments: Wound vac on R groin  Restrictions Weight Bearing Restrictions: No General:   Pain: No c/o pain.    Locomotion : Ambulation Ambulation/Gait Assistance: 4: Min assist   See FIM for current functional status  Therapy/Group: Individual Therapy  Dub Amis 12/11/2014, 3:43 PM

## 2014-12-11 NOTE — Progress Notes (Signed)
ANTICOAGULATION CONSULT NOTE - Follow Up Consult  Pharmacy Consult for coumadin Indication: hx of DVT  Allergies  Allergen Reactions  . Lisinopril Swelling    Lips and tongue swell  . Niacin And Related Other (See Comments)    unknown  . Norvasc [Amlodipine Besylate] Rash    Flushing  . Penicillins Rash   Patient Measurements: Weight: 146 lb 2.6 oz (66.3 kg)  Vital Signs: Temp: 98.3 F (36.8 C) (07/17 0453) Temp Source: Oral (07/17 0453) BP: 123/48 mmHg (07/17 0453) Pulse Rate: 86 (07/17 0453)  Labs:  Recent Labs  12/09/14 0521 12/09/14 1517 12/10/14 0517 12/11/14 0440  HGB  --   --  8.4*  --   HCT  --   --  26.6*  --   PLT  --   --  76*  --   LABPROT 26.4*  --  26.6* 33.0*  INR 2.46*  --  2.49* 3.32*  CREATININE  --  6.54*  --   --     Estimated Creatinine Clearance: 10 mL/min (by C-G formula based on Cr of 6.54).  Assessment: 69 yo male with hx of DVT continues on coumadin. INR is now elevated after a large increase from 2.49 to 3.32. No bleeding noted.   Goal of Therapy:  INR 2-3   Plan:  - No coumadin tonight - Monitor daily  INR, CBC, s/s of bleeding  Neeti Knudtson, Rande Lawman 12/11/2014,9:24 AM

## 2014-12-11 NOTE — Progress Notes (Signed)
Brandon Sharp is a 69 y.o. male [birth year] [id number]  Subjective: No complaints. Resting in bed. Feeling OK.  Objective: Vital signs in last 24 hours: Temp:  [97.9 F (36.6 C)-98.3 F (36.8 C)] 98.3 F (36.8 C) (07/17 0453) Pulse Rate:  [80-86] 86 (07/17 0453) Resp:  [18] 18 (07/17 0453) BP: (103-125)/(48-55) 123/48 mmHg (07/17 0453) SpO2:  [99 %-100 %] 100 % (07/17 0453) Weight:  [146 lb 2.6 oz (66.3 kg)] 146 lb 2.6 oz (66.3 kg) (07/17 0453) Weight change: -3 lb 1.4 oz (-1.4 kg) Last BM Date: 12/09/14  Intake/Output from previous day: 07/16 0701 - 07/17 0700 In: 93 [P.O.:960] Out: -  Last cbgs: CBG (last 3)   Recent Labs  12/10/14 1625 12/10/14 2053 12/11/14 0707  GLUCAP 126* 126* 102*     Physical Exam General: No apparent distress   HEENT: not dry Lungs: Normal effort. Lungs clear to auscultation, no crackles or wheezes. Cardiovascular: Regular rate and rhythm, no edema Abdomen: S/NT/ND; BS(+) Musculoskeletal:  unchanged Neurological: No new neurological deficits Wounds: N/A    Skin: clear  Aging changes Mental state: Alert, oriented, cooperative    Lab Results: BMET    Component Value Date/Time   NA 135 12/09/2014 1517   K 3.2* 12/09/2014 1517   CL 101 12/09/2014 1517   CO2 24 12/09/2014 1517   GLUCOSE 145* 12/09/2014 1517   BUN 23* 12/09/2014 1517   CREATININE 6.54* 12/09/2014 1517   CALCIUM 9.5 12/09/2014 1517   GFRNONAA 8* 12/09/2014 1517   GFRAA 9* 12/09/2014 1517   CBC    Component Value Date/Time   WBC 4.2 12/10/2014 0517   WBC 4.5 02/13/2012 1314   RBC 2.85* 12/10/2014 0517   RBC 2.15* 11/16/2014 1226   RBC 3.21* 02/13/2012 1314   HGB 8.4* 12/10/2014 0517   HGB 11.6* 02/13/2012 1314   HCT 26.6* 12/10/2014 0517   HCT 36.1* 02/13/2012 1314   PLT 76* 12/10/2014 0517   PLT 76* 02/13/2012 1314   MCV 93.3 12/10/2014 0517   MCV 112.6* 02/13/2012 1314   MCH 29.5 12/10/2014 0517   MCH 36.3* 02/13/2012 1314   MCHC 31.6 12/10/2014  0517   MCHC 32.3 02/13/2012 1314   RDW 18.9* 12/10/2014 0517   RDW 15.7* 02/13/2012 1314   LYMPHSABS 1.8 11/16/2014 1157   LYMPHSABS 0.8* 02/13/2012 1314   MONOABS 0.4 11/16/2014 1157   MONOABS 0.6 02/13/2012 1314   EOSABS 0.1 11/16/2014 1157   EOSABS 0.1 02/13/2012 1314   BASOSABS 0.0 11/16/2014 1157   BASOSABS 0.0 02/13/2012 1314    Studies/Results: No results found.  Medications: I have reviewed the patient's current medications.  Assessment/Plan:  1. Functional deficits secondary to R thigh hematoma, PAD with subsequent weakness/ gait instability 2. DVT rx (March 2016): coumadin therapeutic,  -serial INR's and dose adjustment--appreciate pharmacy assist 3. Pain Management: continue oxycodone and observe for pain tolerance with activity   4. Mood: LCSW to follow for evaluation and support. 5. Neuropsych: This patient is capable of making decisions on his own behalf. 6. Skin/Wound Care: Continue VAC right thigh at all times -change MWF per [place] 7. Fluids/Electrolytes/Nutrition: Monitor I/O. Labs with HD, po fluid ~731ml on 7/14 8. DM type2 : Monitor BS ac/hs for pattern--CBGs at goal today, but on lower side Cont to monitor 9. Cardiac transplant: On imuran and sirolimus.  10. ESRD: HD on MWF after therapy sessions to avoid conflict with therapy times- limited draw off of fluid due to hypotension , nephro to  manage 11. Constipation: bowel regimen.No incont , 2 BMs recorded yesterday -adjust softener/laxative  Cont current Rx    Length of stay, days: 9  Walker Kehr , MD 12/11/2014, 9:05 AM

## 2014-12-12 ENCOUNTER — Inpatient Hospital Stay (HOSPITAL_COMMUNITY): Payer: Medicare Other | Admitting: Rehabilitation

## 2014-12-12 ENCOUNTER — Inpatient Hospital Stay (HOSPITAL_COMMUNITY): Payer: Medicare Other | Admitting: Occupational Therapy

## 2014-12-12 DIAGNOSIS — Z941 Heart transplant status: Secondary | ICD-10-CM

## 2014-12-12 LAB — CBC
HCT: 24.2 % — ABNORMAL LOW (ref 39.0–52.0)
HCT: 24.8 % — ABNORMAL LOW (ref 39.0–52.0)
HEMOGLOBIN: 7.5 g/dL — AB (ref 13.0–17.0)
Hemoglobin: 7.8 g/dL — ABNORMAL LOW (ref 13.0–17.0)
MCH: 28.6 pg (ref 26.0–34.0)
MCH: 28.7 pg (ref 26.0–34.0)
MCHC: 31 g/dL (ref 30.0–36.0)
MCHC: 31.5 g/dL (ref 30.0–36.0)
MCV: 92.4 fL (ref 78.0–100.0)
MCV: 91.2 fL (ref 78.0–100.0)
Platelets: 77 10*3/uL — ABNORMAL LOW (ref 150–400)
Platelets: 70 10*3/uL — ABNORMAL LOW (ref 150–400)
RBC: 2.62 MIL/uL — ABNORMAL LOW (ref 4.22–5.81)
RBC: 2.72 MIL/uL — ABNORMAL LOW (ref 4.22–5.81)
RDW: 18.9 % — ABNORMAL HIGH (ref 11.5–15.5)
RDW: 19.1 % — ABNORMAL HIGH (ref 11.5–15.5)
WBC: 3.2 10*3/uL — ABNORMAL LOW (ref 4.0–10.5)
WBC: 3.2 10*3/uL — ABNORMAL LOW (ref 4.0–10.5)

## 2014-12-12 LAB — PROTIME-INR
INR: 3.36 — ABNORMAL HIGH (ref 0.00–1.49)
Prothrombin Time: 33.3 s — ABNORMAL HIGH (ref 11.6–15.2)

## 2014-12-12 LAB — GLUCOSE, CAPILLARY
Glucose-Capillary: 129 mg/dL — ABNORMAL HIGH (ref 65–99)
Glucose-Capillary: 104 mg/dL — ABNORMAL HIGH (ref 65–99)
Glucose-Capillary: 98 mg/dL (ref 65–99)

## 2014-12-12 LAB — RENAL FUNCTION PANEL
Albumin: 2.9 g/dL — ABNORMAL LOW (ref 3.5–5.0)
Anion gap: 10 (ref 5–15)
BUN: 46 mg/dL — ABNORMAL HIGH (ref 6–20)
CO2: 23 mmol/L (ref 22–32)
Calcium: 9.2 mg/dL (ref 8.9–10.3)
Chloride: 99 mmol/L — ABNORMAL LOW (ref 101–111)
Creatinine, Ser: 9.44 mg/dL — ABNORMAL HIGH (ref 0.61–1.24)
GFR calc Af Amer: 6 mL/min — ABNORMAL LOW (ref >60)
GFR calc non Af Amer: 5 mL/min — ABNORMAL LOW (ref >60)
Glucose, Bld: 117 mg/dL — ABNORMAL HIGH (ref 65–99)
Phosphorus: 3.7 mg/dL (ref 2.5–4.6)
Potassium: 3.6 mmol/L (ref 3.5–5.1)
Sodium: 132 mmol/L — ABNORMAL LOW (ref 135–145)

## 2014-12-12 MED ORDER — WARFARIN SODIUM 2.5 MG PO TABS
2.5000 mg | ORAL_TABLET | Freq: Once | ORAL | Status: DC
  Filled 2014-12-12: qty 1

## 2014-12-12 MED ORDER — DOXERCALCIFEROL 4 MCG/2ML IV SOLN
INTRAVENOUS | Status: AC
Start: 2014-12-12 — End: 2014-12-12
  Filled 2014-12-12: qty 2

## 2014-12-12 MED ORDER — ALTEPLASE 2 MG IJ SOLR
2.0000 mg | Freq: Once | INTRAMUSCULAR | Status: DC | PRN
  Filled 2014-12-12: qty 2

## 2014-12-12 MED ORDER — NEPRO/CARBSTEADY PO LIQD
237.0000 mL | ORAL | Status: DC | PRN
  Filled 2014-12-12: qty 237

## 2014-12-12 MED ORDER — LIDOCAINE HCL (PF) 1 % IJ SOLN
5.0000 mL | INTRAMUSCULAR | Status: DC | PRN
Start: 2014-12-12 — End: 2014-12-12

## 2014-12-12 MED ORDER — LIDOCAINE-PRILOCAINE 2.5-2.5 % EX CREA
1.0000 | TOPICAL_CREAM | CUTANEOUS | Status: DC | PRN
Start: 2014-12-12 — End: 2014-12-12
  Filled 2014-12-12: qty 5

## 2014-12-12 MED ORDER — COLLAGENASE 250 UNIT/GM EX OINT
TOPICAL_OINTMENT | Freq: Every day | CUTANEOUS | Status: DC
  Administered 2014-12-12 – 2014-12-13 (×2): via TOPICAL
  Filled 2014-12-12: qty 30

## 2014-12-12 MED ORDER — SODIUM CHLORIDE 0.9 % IV SOLN
100.0000 mL | INTRAVENOUS | Status: DC | PRN
Start: 2014-12-12 — End: 2014-12-12

## 2014-12-12 MED ORDER — PENTAFLUOROPROP-TETRAFLUOROETH EX AERO: 1.0000 "application " | INHALATION_SPRAY | CUTANEOUS | Status: DC | PRN

## 2014-12-12 NOTE — Progress Notes (Signed)
Brandon Sharp PHYSICAL MEDICINE & REHABILITATION     PROGRESS NOTE    Subjective/Complaints:  wound vac D/Ced happy to be "get free of it" amb in hall with PT, discussed good progress with PT Appreciate VVS note ROS: Pt denies , nausea, vomiting,diarrhea, chest pain, shortness of breath,   Objective: Vital Signs: Blood pressure 119/53, pulse 88, temperature 97.5 F (36.4 C), temperature source Oral, resp. rate 18, weight 66.679 kg (147 lb), SpO2 100 %. No results found.  Recent Labs  12/10/14 0517 12/12/14 0524  WBC 4.2 3.2*  HGB 8.4* 7.5*  HCT 26.6* 24.2*  PLT 76* 77*    Recent Labs  12/09/14 1517  NA 135  K 3.2*  CL 101  GLUCOSE 145*  BUN 23*  CREATININE 6.54*  CALCIUM 9.5   CBG (last 3)   Recent Labs  12/11/14 1654 12/11/14 2139 12/12/14 0649  GLUCAP 141* 164* 129*    Wt Readings from Last 3 Encounters:  12/12/14 66.679 kg (147 lb)  12/02/14 70.1 kg (154 lb 8.7 oz)  11/08/14 74.844 kg (165 lb)    Physical Exam:  Constitutional: He is oriented to person, place, and time. He appears well-developed and well-nourished.  HENT:  Head: Normocephalic and atraumatic.  Right Ear: External ear normal.  Left Ear: External ear normal.  Eyes: Conjunctivae and EOM are normal. Pupils are equal, round, and reactive to light.  Neck: Normal range of motion.  Cardiovascular: Normal rate and normal heart sounds.  Respiratory: No respiratory distress.  GI: He exhibits no distension.  Musculoskeletal: He exhibits edema.  Marland KitchenRUE with 2+ and  LUE with 1+ +edema still present. 1+ pretib and 2+ pedal edema, Neurological: He is alert and oriented to person, place, and time.  UE: 5/5 prox to distal. RLE: 2+hf, 3 ke, 4 adf/apf, LLE: 3 hf, 3+ke, 4 adf/apf. No sensory deficits. attention/awareness improving      Skin: Skin is dry.  Psychiatric: He has a normal mood and affect. His behavior is normal.   Assessment/Plan: 1. Functional deficits secondary to debility  related to right thigh hematoma which require 3+ hours per day of interdisciplinary therapy in a comprehensive inpatient rehab setting. Physiatrist is providing close team supervision and 24 hour management of active medical problems listed below. Physiatrist and rehab team continue to assess barriers to discharge/monitor patient progress toward functional and medical goals.   FIM: FIM - Bathing Bathing Steps Patient Completed: Chest, Right Arm, Left Arm, Abdomen, Front perineal area, Buttocks, Right upper leg, Left upper leg, Right lower leg (including foot), Left lower leg (including foot) Bathing: 4: Steadying assist  FIM - Upper Body Dressing/Undressing Upper body dressing/undressing steps patient completed: Thread/unthread right sleeve of pullover shirt/dresss, Thread/unthread left sleeve of pullover shirt/dress, Put head through opening of pull over shirt/dress, Pull shirt over trunk Upper body dressing/undressing: 5: Supervision: Safety issues/verbal cues FIM - Lower Body Dressing/Undressing Lower body dressing/undressing steps patient completed: Thread/unthread right underwear leg, Thread/unthread left underwear leg, Pull underwear up/down, Thread/unthread right pants leg, Thread/unthread left pants leg, Pull pants up/down, Don/Doff right sock, Don/Doff left sock, Don/Doff right shoe, Don/Doff left shoe Lower body dressing/undressing: 4: Steadying Assist  FIM - Toileting Toileting steps completed by patient: Adjust clothing prior to toileting, Performs perineal hygiene, Adjust clothing after toileting Toileting Assistive Devices: Grab bar or rail for support Toileting: 4: Steadying assist  FIM - Radio producer Devices: Oncologist Transfers: 4-To toilet/BSC: Min A (steadying Pt. > 75%), 4-From toilet/BSC: Min  A (steadying Pt. > 75%)  FIM - Control and instrumentation engineer Devices: Best boy: 4: Chair or W/C > Bed: Min A  (steadying Pt. > 75%), 5: Supine > Sit: Supervision (verbal cues/safety issues)  FIM - Locomotion: Wheelchair Distance: 300 Locomotion: Wheelchair: 5: Travels 150 ft or more: maneuvers on rugs and over door sills with supervision, cueing or coaxing FIM - Locomotion: Ambulation Locomotion: Ambulation Assistive Devices: Journalist, newspaper Ambulation/Gait Assistance: 4: Min assist Locomotion: Ambulation: 4: Travels 150 ft or more with minimal assistance (Pt.>75%)  Comprehension Comprehension Mode: Auditory Comprehension: 6-Follows complex conversation/direction: With extra time/assistive device  Expression Expression Mode: Verbal Expression: 6-Expresses complex ideas: With extra time/assistive device  Social Interaction Social Interaction: 6-Interacts appropriately with others with medication or extra time (anti-anxiety, antidepressant).  Problem Solving Problem Solving: 5-Solves complex 90% of the time/cues < 10% of the time  Memory Memory: 5-Recognizes or recalls 90% of the time/requires cueing < 10% of the time  Medical Problem List and Plan: 1. Functional deficits secondary to R thigh hematoma, PAD with subsequent weakness/ gait instability 2. DVT rx (March 2016): coumadin therapeutic,  -serial INR's and dose adjustment--appreciate pharmacy assist 3. Pain Management: continue oxycodone and observe for pain tolerance with activity   4. Mood: LCSW to follow for evaluation and support. 5. Neuropsych: This patient is capable of making decisions on his own behalf. 6. Skin/Wound Care: Continue VAC right thigh at all times -change MWF per [place] 7. Fluids/Electrolytes/Nutrition: Monitor I/O. Labs with HD, po fluid ~968ml on 7/17 8. DM type2 : Monitor BS ac/hs for pattern--CBGs at goal today, but on higher side  Cont to monitor 9. Cardiac transplant: On imuran and sirolimus.  10. ESRD: HD on MWF after therapy sessions to avoid conflict with therapy  times- limited draw off of fluid due to hypotension , nephro to manage 11. Constipation: bowel regimen.No incont , 1 BM recorded yesterday  -adjust softener/laxative  LOS (Days) 10 A FACE TO FACE EVALUATION WAS PERFORMED  Brandon Sharp 12/12/2014 8:25 AM

## 2014-12-12 NOTE — Progress Notes (Signed)
  [facility] Progress Note  Subjective  Right leg feels fine. Denies any pain.   Objective Filed Vitals:   12/12/14 0647  BP:   Pulse:   Temp: 97.5 F (36.4 C)  Resp:    Right groin healing. Wound yellow slough to wound bed, pale pink granulation tissue at edges.   Assessment/Planning: 69 y.o. male is s/p: R groin hematoma evacuation and VAC placement 11/21/14, s/p R thigh AVG 10/20/14  Start santyl to right groin daily.  Patient still electing observation for right rest pain.   Alvia Grove 12/12/2014 8:25 AM  Virgina Jock, PA-C [facility] Office: [phone] Pager: [phone] 12/12/2014 8:25 AM

## 2014-12-12 NOTE — Progress Notes (Signed)
Occupational Therapy Session Note  Patient Details  Name: [name] MRN: [id number] Date of Birth: 04-22-1946  Today's Date: 12/12/2014 OT Individual Time: 0737-1062 OT Individual Time Calculation (min): 90 min    Short Term Goals: Week 2:  OT Short Term Goal 1 (Week 2): STG=LTGs secondary to upcoming discharge  Skilled Therapeutic Interventions/Progress Updates:  Upon entering the room, pt seated in wheelchair with wife present in room for family education. Pt with no c/o pain this session. Caregiver stating she could only stay a few minutes before having to leave. OT educated caregiver on current OT goals and progress towards goal. Recommendation for use of RW for short distance ambulation with supervision. Discussion regarding ADLs as well with recommendation to have to bathe from chair at sink or EOB with basin and requiring supervision when standing for safety. Wife exiting the room and pt propelling wheelchair with B UEs and LEs towards dayroom 150' with supervision. Pt standing at table top while engaged in card game. Pt required min verbal cues for wider base of support and weight shift. Pt with posterior lean while standing close supervision - min guard. Pt standing for 2 bouts of 4 min and 6 min respectively. Pt returning to room in same manner as stated above. Pt engaged in bathing and dressing with sit <>stand at sink side with close supervision while standing to wash buttocks and peri area. Pt them ambulating to recliner chair with use of RW and supervision. Call bell and all needed items within reach upon exiting the room.   Therapy Documentation Precautions:  Precautions Precautions: Fall Precaution Comments: Wound vac on R groin  Restrictions Weight Bearing Restrictions: No  Pain: Pain Assessment Pain Score: 0-No pain ADL: ADL ADL Comments: refer to FIM  See FIM for current functional status  Therapy/Group: Individual Therapy  [name] 12/12/2014,  11:59 AM

## 2014-12-12 NOTE — Plan of Care (Signed)
Problem: RH Bed to Chair Transfers Goal: LTG Patient will perform bed/chair transfers w/assist (PT) LTG: Patient will perform bed/chair transfers with assistance, with/without cues (PT).  Downgraded due to needing cues for safety.   Problem: RH Car Transfers Goal: LTG Patient will perform car transfers with assist (PT) LTG: Patient will perform car transfers with assistance (PT).  Downgraded due to cues needed for safety  Problem: RH Ambulation Goal: LTG Patient will ambulate in home environment (PT) LTG: Patient will ambulate in home environment, # of feet with assistance (PT).  Downgraded due to cues needed for safety  Problem: RH Stairs Goal: LTG Patient will ambulate up and down stairs w/assist (PT) LTG: Patient will ambulate up and down # of stairs with assistance (PT)  Downgraded due to pt not having rails to enter home.

## 2014-12-12 NOTE — Progress Notes (Signed)
Physical Therapy Session Note  Patient Details  Name: Brandon Sharp MRN: [id number] Date of Birth: [birth year]  Today's Date: 12/12/2014 PT Individual Time: 0800-0900 PT Individual Time Calculation (min): 60 min   Short Term Goals: Week 1:  PT Short Term Goal 1 (Week 1): STG =LTG  Skilled Therapeutic Interventions/Progress Updates:   Pt received lying in bed, MD in room changing dressing on wound.  Pt agreeable to therapy session, but wanting to eat breakfast prior to starting session.  Began discussing family training with wife this afternoon.  Pt verbalized understanding and states that she will be here.  Also questioned pt regarding pressure relief techniques.  Pt able to verbalize correct technique and how often to perform as well as how long to hold each position.  Reminded pt that if doing lateral leans, needed to hold each side for 2 mins.  Pt able to get dressed this morning at mod I level while sitting and S level while standing.  Min cues for hand placement and safety.  Pt ambulated to therapy gym x 180' with RW at S level.  Min cues for posture and keeping head looking up.   Once in therapy gym, performed 4 way hip exercise with orange theraband x 10 reps each direction BLE.  Discussed sending this exercise home with pt.  Will make handout and provided education on safety with maintaining UE support during task.  Ended session with sit<>stands x 5 reps with hands on thighs to encourage increased WB and strengthening through LEs.  Provided facilitation at trunk for increased forward trunk lean and once in standing needs facilitation for hip extension.  Note that this tends to throw pt off balance as he is very used to leaning forward on RW.  Education on decreasing reliance of UEs with LE strengthening.  Pt ambulated part of distance back to room and was assisted remainder of distance.   Left in w/c with all needs in reach and set timer for pressure relief.    Therapy  Documentation Precautions:  Precautions Precautions: Fall Precaution Comments: Wound vac on R groin  Restrictions Weight Bearing Restrictions: No   Vital Signs: Therapy Vitals Temp: 97.5 F (36.4 C) Temp Source: Oral Pulse Rate: 88 BP: (!) 119/53 mmHg Patient Position (if appropriate): Lying Oxygen Therapy SpO2: 100 % O2 Device: Not Delivered Pain: 2/10 R leg, monitored during session.   See FIM for current functional status  Therapy/Group: Individual Therapy  Denice Bors 12/12/2014, 8:04 AM

## 2014-12-12 NOTE — Progress Notes (Signed)
Physical Therapy Session Note  Patient Details  Name: Brandon Sharp MRN: [id number] Date of Birth: [birth year]  Today's Date: 12/12/2014 PT Individual Time: 1300-1345 PT Individual Time Calculation (min): 45 min   Short Term Goals: Week 1:  PT Short Term Goal 1 (Week 1): STG =LTG  Skilled Therapeutic Interventions/Progress Updates:   Pt received sitting in recliner in room, agreeable to therapy session.  Wife present during session for family training and education.  Discussed energy conservation, remaining active at home, ambulation with RW at this time for safety, and also pressure relief when sitting in chair at home. Discussed S level goals and the need for 24/7 S from wife, therefore if wife runs errands, someone needs to be with him or he needs to go with her.  Also discussed performing EOB/chair level sponge bathing until cleared to shower via MD.  Pt ambulated throughout session up to 180' at a time in controlled and home simulated environment with RW at S level.  Ambulated first with therapist, with therapist providing cues on posture, looking up and keeping hips tucked in during gait.  Wife also ambulated with pt with cues to remain close to pt, as she would tend to walk ahead of him.  Performed stair training to simulate home entry with use of RW ascending backwards and bringing RW up then getting into home.  PT demonstrated and pt then return demonstration at S level.  Ambulated to ortho gym and performed car transfer to simulated home car height.  Again, performed at S level with good recall of safe entry.  Ambulated back to therapy gym for seated rest break before returning to room.   Left in recliner with all needs in reach.   Therapy Documentation Precautions:  Precautions Precautions: Fall Precaution Comments: Wound vac on R groin  Restrictions Weight Bearing Restrictions: No  Pain: pt with no reports of pain during session.    Locomotion : Ambulation Ambulation/Gait  Assistance: 5: Supervision   See FIM for current functional status  Therapy/Group: Individual Therapy  Denice Bors 12/12/2014, 1:42 PM

## 2014-12-12 NOTE — Progress Notes (Signed)
[facility] Progress Note  Assessment/Plan: 1. ESRD - MWF HD -K 3.2 Friday no heparin with HD since on anticoagulation- though he previously was on both. HD orders written for Monday. AVF in RUE pulsatile with short bruit, although clotted in May 2016.Pt declined use Friday. Examined today and AVF only has short bruit at lower portion of fistula. Do not believe it is suitable for use.  Risk for infiltration on coumadin high. Will use catheter today.  Further study should be considered prior to use. 2. Vol excess - edema improved, wt down 65.3kg, net UF 2.4 on Friday- looks like new edw may be 65 3. Dehisced R thigh AVG wound w hematoma , s/p hematoma evacuation and wound VAC Off abx, cx's neg. 4. RLE arterial insufficiency - s/p angio / per Dr.Chen- not likely to be responsive to endovascular intervention- Plan for now is observation 5. Anemia - due to AVG bleed / CKD Anemia/ and immunosuppression meds- s/p transfusions on 6/22 (2U) and 7/3 (1U)prior to rehab admit. On max ESA/ sp Fe load. Hgb 7.5 today. May need transfusion before DC home 12/14/14. Follow CBC.  6. HTN controlled on coreg 6.25 qhs and tid Isordil 40mg , new lower EDW 7. Metabolic bone disease - Hectorol 4. Phos 4. Reduced ca acetate BID with meals this admission 8. 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 - INR 2.39  9. CAD / remote CABG / heart transplant 2001- po meds Imuran, Rapamune, Prednisone 10. Dm type 2- per primary service 11. Malnutrition - alb mid 2's - renal diet/vits/supplements 12. Thrombocytopenia - increases bleeding risk  Disposition - DC date set for July 20th. Patient very happy to be going home.  Rita H. Brown NP-C 12/12/2014, 2:53 PM  [facility] [phone]  Subjective:  "I'm OKAY".    Objective Filed Vitals:   12/11/14 0453 12/11/14 2120 12/12/14 0604 12/12/14 0647  BP: 123/48 131/52 119/53   Pulse: 86 80 88   Temp: 98.3 F (36.8 C)   97.5 F (36.4 C)   TempSrc: Oral   Oral  Resp: 18     Weight: 66.3 kg (146 lb 2.6 oz)   66.679 kg (147 lb)  SpO2: 100%   100%   Physical Exam General: Alert, very pleasant male in NAD.  Heart: S1,S2, RRR No R/G/R Lungs: Bilateral breath sounds CTA Abdomen: soft, nontender with active BS. Extremities: Wound Vac R. Femoral area. Trace edema LE.  Dialysis Access: L perm cath. AVG R Femoral with wound vac. RUA AVF as described above.  Dialysis Orders: [facility] MWF 4 hours 72kg 2/2 bath 4hr Heparin 3000 (no hep at dc, on coumadin) Hectorol 4  Aranesp 200 q Wed no Fe  Additional Objective Labs: Basic Metabolic Panel:  Recent Labs Lab 12/05/14 1937 12/07/14 1740 12/09/14 1517  NA 136 134* 135  K 3.5 3.8 3.2*  CL 99* 100* 101  CO2 27 25 24   GLUCOSE 102* 100* 145*  BUN 8 29* 23*  CREATININE 3.05* 7.37* 6.54*  CALCIUM 8.2* 9.3 9.5  PHOS 2.0* 3.8 2.8   Liver Function Tests:  Recent Labs Lab 12/05/14 1937 12/07/14 1740 12/09/14 1517  ALBUMIN 3.1* 2.8* 2.8*   No results for input(s): LIPASE, AMYLASE in the last 168 hours. CBC:  Recent Labs Lab 12/06/14 0648 12/08/14 0507 12/10/14 0517 12/12/14 0524  WBC 3.9* 3.4* 4.2 3.2*  HGB 9.0* 8.4* 8.4* 7.5*  HCT 28.9* 26.9* 26.6* 24.2*  MCV 92.9 92.4 93.3 92.4  PLT 82* 66*  76* 77*   Blood Culture    Component Value Date/Time   SDES WOUND GROIN RIGHT 11/21/2014 1007   SDES WOUND GROIN RIGHT 11/21/2014 1007   SPECREQUEST RIGHT GROIN HEMATOMA PT ON VANCOMYCIN 11/21/2014 1007   SPECREQUEST RIGHT GROIN HEMATOMA PT ON VANCOMYCIN 11/21/2014 1007   CULT  11/21/2014 1007    NO ANAEROBES ISOLATED Performed at [place]  11/21/2014 1007    NO GROWTH 3 DAYS Performed at [place] 11/26/2014 FINAL 11/21/2014 1007   REPTSTATUS 11/24/2014 FINAL 11/21/2014 1007    Cardiac Enzymes: No results for input(s): CKTOTAL, CKMB, CKMBINDEX, TROPONINI in the last 168 hours. CBG:  Recent Labs Lab  12/11/14 1156 12/11/14 1654 12/11/14 2139 12/12/14 0649 12/12/14 1218  GLUCAP 112* 141* 164* 129* 104*   Iron Studies: No results for input(s): IRON, TIBC, TRANSFERRIN, FERRITIN in the last 72 hours. @lablastinr3 @ Studies/Results: No results found. Medications:    atorvastatin  40 mg Oral Daily   azaTHIOprine  75 mg Oral q morning - 10a   calcium acetate  667 mg Oral BID WC   carvedilol  6.25 mg Oral QHS   colesevelam  3,750 mg Oral Daily   collagenase   Topical Daily   darbepoetin (ARANESP) injection - DIALYSIS  200 mcg Intravenous Q Wed-HD   doxercalciferol  4 mcg Intravenous Q M,W,F-HD   famotidine  10 mg Oral QHS   feeding supplement  1 Container Oral BID WC   feeding supplement (PRO-STAT SUGAR FREE 64)  30 mL Oral BID   insulin aspart  0-5 Units Subcutaneous QHS   isosorbide dinitrate  40 mg Oral 3 times per day   multivitamin  1 tablet Oral QHS   pantoprazole  40 mg Oral Daily   predniSONE  5 mg Oral Q breakfast   senna-docusate  3 tablet Oral BID   Sirolimus  1.5 mg Oral Daily   warfarin  2.5 mg Oral ONCE-1800   Warfarin - Pharmacist Dosing Inpatient   Does not apply [phone]

## 2014-12-12 NOTE — Progress Notes (Signed)
ANTICOAGULATION CONSULT NOTE - Follow Up Consult  Pharmacy Consult for coumadin Indication: hx of DVT  Allergies  Allergen Reactions  . Lisinopril Swelling    Lips and tongue swell  . Niacin And Related Other (See Comments)    unknown  . Norvasc [Amlodipine Besylate] Rash    Flushing  . Penicillins Rash    Patient Measurements: Weight: 147 lb (66.679 kg) Heparin Dosing Weight:   Vital Signs: Temp: 97.5 F (36.4 C) (07/18 0647) Temp Source: Oral (07/18 0647) BP: 119/53 mmHg (07/18 0604) Pulse Rate: 88 (07/18 0604)  Labs:  Recent Labs  12/09/14 1517 12/10/14 0517 12/11/14 0440 12/12/14 0524  HGB  --  8.4*  --  7.5*  HCT  --  26.6*  --  24.2*  PLT  --  76*  --  77*  LABPROT  --  26.6* 33.0* 33.3*  INR  --  2.49* 3.32* 3.36*  CREATININE 6.54*  --   --   --     Estimated Creatinine Clearance: 10.1 mL/min (by C-G formula based on Cr of 6.54).   Medications:  Scheduled:  . atorvastatin  40 mg Oral Daily  . azaTHIOprine  75 mg Oral q morning - 10a  . calcium acetate  667 mg Oral BID WC  . carvedilol  6.25 mg Oral QHS  . colesevelam  3,750 mg Oral Daily  . darbepoetin (ARANESP) injection - DIALYSIS  200 mcg Intravenous Q Wed-HD  . doxercalciferol  4 mcg Intravenous Q M,W,F-HD  . famotidine  10 mg Oral QHS  . feeding supplement  1 Container Oral BID WC  . feeding supplement (PRO-STAT SUGAR FREE 64)  30 mL Oral BID  . insulin aspart  0-5 Units Subcutaneous QHS  . isosorbide dinitrate  40 mg Oral 3 times per day  . lidocaine-prilocaine   Topical Once  . multivitamin  1 tablet Oral QHS  . pantoprazole  40 mg Oral Daily  . predniSONE  5 mg Oral Q breakfast  . senna-docusate  3 tablet Oral BID  . Sirolimus  1.5 mg Oral Daily  . Warfarin - Pharmacist Dosing Inpatient   Does not apply q1800   Infusions:    Assessment: 69 yo male with hx of DVT is currently on supratherapeutic coumadin.  INR is 3.36.  Dose was held last night; patient's prior to admission dose was  5 mg daily.  Goal of Therapy:  INR 2-3 Monitor platelets by anticoagulation protocol: Yes   Plan:  - coumadin 2.5 mg po x1 - INR in am  Brandon Sharp, Tsz-Yin 12/12/2014,8:29 AM

## 2014-12-12 NOTE — Plan of Care (Signed)
Problem: RH Balance Goal: LTG Patient will maintain dynamic standing with ADLs (OT) LTG: Patient will maintain dynamic standing balance with assist during activities of daily living (OT)  Downgraded secondary to pt requiring supervision for safety  Problem: RH Bathing Goal: LTG Patient will bathe with assist, cues/equipment (OT) LTG: Patient will bathe specified number of body parts with assist with/without cues using equipment (position) (OT)  Downgraded secondary to pt requiring supervision for safety  Problem: RH Dressing Goal: LTG Patient will perform upper body dressing (OT) LTG Patient will perform upper body dressing with assist, with/without cues (OT).  Downgraded secondary to pt requiring supervision for safety Goal: LTG Patient will perform lower body dressing w/assist (OT) LTG: Patient will perform lower body dressing with assist, with/without cues in positioning using equipment (OT)  Downgraded secondary to pt requiring supervision for safety  Problem: RH Toileting Goal: LTG Patient will perform toileting w/assist, cues/equip (OT) LTG: Patient will perform toiletiing (clothes management/hygiene) with assist, with/without cues using equipment (OT)  Downgraded secondary to pt requiring supervision for safety  Problem: RH Toilet Transfers Goal: LTG Patient will perform toilet transfers w/assist (OT) LTG: Patient will perform toilet transfers with assist, with/without cues using equipment (OT)  Downgraded secondary to pt requiring supervision for safety

## 2014-12-13 ENCOUNTER — Inpatient Hospital Stay (HOSPITAL_COMMUNITY): Payer: Medicare Other | Admitting: Physical Therapy

## 2014-12-13 ENCOUNTER — Encounter: Payer: Self-pay | Admitting: Vascular Surgery

## 2014-12-13 ENCOUNTER — Inpatient Hospital Stay (HOSPITAL_COMMUNITY): Payer: Medicare Other | Admitting: Occupational Therapy

## 2014-12-13 ENCOUNTER — Inpatient Hospital Stay (HOSPITAL_COMMUNITY): Payer: Medicare Other

## 2014-12-13 ENCOUNTER — Inpatient Hospital Stay (HOSPITAL_COMMUNITY): Payer: Medicare Other | Admitting: Rehabilitation

## 2014-12-13 ENCOUNTER — Ambulatory Visit (HOSPITAL_COMMUNITY): Payer: Medicare Other | Admitting: *Deleted

## 2014-12-13 LAB — GLUCOSE, CAPILLARY
GLUCOSE-CAPILLARY: 101 mg/dL — AB (ref 65–99)
GLUCOSE-CAPILLARY: 122 mg/dL — AB (ref 65–99)
GLUCOSE-CAPILLARY: 99 mg/dL (ref 65–99)
Glucose-Capillary: 98 mg/dL (ref 65–99)
Glucose-Capillary: 99 mg/dL (ref 65–99)

## 2014-12-13 LAB — PROTIME-INR
INR: 2.17 — AB (ref 0.00–1.49)
PROTHROMBIN TIME: 24 s — AB (ref 11.6–15.2)

## 2014-12-13 MED ORDER — WARFARIN SODIUM 5 MG PO TABS
5.0000 mg | ORAL_TABLET | Freq: Once | ORAL | Status: AC
  Administered 2014-12-13: 5 mg via ORAL

## 2014-12-13 NOTE — Progress Notes (Signed)
Occupational Therapy Session Note  Patient Details  Name: Brandon Sharp MRN: [id number] Date of Birth: 08/23/1945  Today's Date: 12/13/2014 OT Individual Time: 1015-1130 OT Individual Time Calculation (min): 75 min    Short Term Goals: Week 1:  OT Short Term Goal 1 (Week 1): STGs = LTGs Week 2:  OT Short Term Goal 1 (Week 2): STG=LTGs secondary to upcoming discharge  Skilled Therapeutic Interventions/Progress Updates:  Patient received seated in w/c. Co-treatment with RT 10:15-11:00. Individual Treatment 11:00-11:30. During co-treatment focused on functional strengthening and endurance, pt propelled self from room -> atrium -> outside with a few rest breaks. Encouraged rest breaks and educated patient on energy conservation techniques. Also discussed importance of using RW throughout house and during community mobility and community re-entry as pt enjoys going out to eat with his wife. Due to fatigue, therapist assisted patient back to 4th floor. Pt sat in w/c in gym and therapist educated patient on BUE strengthening exercises with and without use of theraband, see below. Also encouraged patient to use stress ball for functional gross grasp strengthening, fine motor coordination, and gross motor coordination. Assisted patient back to room and left patient seated in w/c with all needs within reach. Patient with no further questions for OT regarding d/c -> home tomorrow. Pt states his wife will be present and can provide 24/7 supervision.   Flexion (Resistive Band)   With band looped around hand and wrist, use elbow movements only. Using other arm as anchor, bend elbow, pulling up. Hold ___2_ seconds. Repeat __7-10__ times. Do __at least 3__ sessions per day. Bend elbow as pictured, then straighten all the way.   Bend elbow then straighten with resistive band. RIGHT and LEFT  Copyright  VHI. All rights reserved.   Extension (Resistive Band)   With band looped around hand and wrist,  use elbow movements only. Using other arm as anchor, straighten elbow, pushing down. Hold _2___ seconds. Repeat _7-10___ times. Do at least 3  sessions per day.   Make sure you are straightening your elbow all the way and bending it with resistive band. RIGHT and LEFT Copyright  VHI. All rights reserved.   Flexion (Active)   Raise arm to point to ceiling, keeping elbow straight. Hold __2__ seconds. Repeat __7-10__ times. Do _at least 3  sessions per day. Activity: Use this motion to reach for items on overhead shelves.  Do this exercise in sitting position. Make sure thumb leads the way. RIGHT and LEFT  Copyright  VHI. All rights reserved.  Abduction (Active)   Raise arm out to side, with elbow straight and palm facing downwards. Do not shrug shoulder or tilt trunk. Hold _2___ seconds. Repeat _7-10___ times. Do _at least 3  sessions per day.  Bring arm across body, touch opposite shoulder then bring arm out to the side, keeping arm straight. RIGHT and LEFT  Copyright  VHI. All rights reserved.   Precautions:  Precautions Precautions: Fall Precaution Comments: Wound vac on R groin  Restrictions Weight Bearing Restrictions: No  ADL: ADL ADL Comments: refer to FIM  See FIM for current functional status  Therapy/Group: Individual Therapy and Co-Treatment  Swan Zayed , MS, OTR/L, CLT Pager: [phone]  12/13/2014, 12:59 PM

## 2014-12-13 NOTE — Plan of Care (Signed)
Problem: RH BOWEL ELIMINATION Goal: RH STG MANAGE BOWEL WITH ASSISTANCE STG Manage Bowel with Min Assistance.  Outcome: Not Progressing Pt refusing sorbitol. LBM 12/09/14

## 2014-12-13 NOTE — Progress Notes (Signed)
Recreational Therapy Session Note  Patient Details  Name: Brandon Sharp MRN: [id number] Date of Birth: 02/13/1946 Today's Date: 12/13/2014  Pain: no c/o Skilled Therapeutic Interventions/Progress Updates: Met with pt today to further discuss community reintegration.  Pt propelled w/c using BUEs and/or BLEs throughout hospital and outside on slightly uneven surfaces with supervision & multiple rest breaks due to low activity tolerance.  Discussion included monitoring activity level, identifying energy conservation technique and overall safety.  Also discussed use of leisure time specifically with pt initiating activities to occur with his family within his home vs. him travelling to them as he did previously.  Therapy/Group: Co-Treatment   Brandon Sharp 12/13/2014, 1:08 PM

## 2014-12-13 NOTE — Progress Notes (Signed)
Occupational Therapy Discharge Summary  Patient Details  Name: Brandon Sharp MRN: [id number] Date of Birth: [birth year]   Patient has met 6 of 6 long term goals due to improved activity tolerance, improved balance, ability to compensate for deficits and improved awareness.  Pt made steady progress with BADLs during this admission and is supervision for bathing, dressing, toilet transfers, and toileting.  Pt continues to fatigue quickly and requires multiple rest breaks to complete all tasks.  Pt's wife has been present for therapy sessions. Patient's care partner is independent to provide the necessary supervision assistance at discharge.     Recommendation:  Patient will benefit from ongoing skilled OT services in home health setting to continue to advance functional skills in the area of BADL and iADL.  Equipment: No equipment providedPt owns shower seat and declined BSC  Reasons for discharge: treatment goals met and discharge from hospital  Patient/family agrees with progress made and goals achieved: Yes  OT Discharge Vision/Perception  Vision- History Baseline Vision/History: Wears glasses Patient Visual Report: No change from baseline Vision- Assessment Vision Assessment?: No apparent visual deficits  Cognition Overall Cognitive Status: Within Functional Limits for tasks assessed Arousal/Alertness: Awake/alert Orientation Level: Oriented X4 Attention: Alternating Focused Attention: Appears intact Alternating Attention: Appears intact Memory: Appears intact Awareness: Appears intact Problem Solving: Appears intact Comments: delayed processing Sensation Sensation Light Touch: Appears Intact Stereognosis: Appears Intact Hot/Cold: Appears Intact Proprioception: Appears Intact Coordination Gross Motor Movements are Fluid and Coordinated: Yes Fine Motor Movements are Fluid and Coordinated: Yes Motor  Motor Motor: Within Functional Limits Mobility  Bed Mobility Bed  Mobility: Supine to Sit Supine to Sit: 5: Supervision Transfers Transfers: Sit to Stand;Stand to Sit Sit to Stand: 5: Supervision Stand to Sit: 5: Supervision  Trunk/Postural Assessment  Cervical Assessment Cervical Assessment:  (forward flexion of head in standing ) Thoracic Assessment Thoracic Assessment: Within Functional Limits Lumbar Assessment Lumbar Assessment: Within Functional Limits Postural Control Postural Control: Within Functional Limits  Balance Balance Balance Assessed: Yes Dynamic Sitting Balance Dynamic Sitting - Level of Assistance: 6: Modified independent (Device/Increase time) Static Standing Balance Static Standing - Level of Assistance: 5: Stand by assistance Static Standing - Comment/# of Minutes: increased reliance of UE on RW in standing  Extremity/Trunk Assessment RUE Assessment RUE Assessment: Within Functional Limits (RUE weaker than LUE, RUE grossly 3+/5, LUE grossly 4+/5) LUE Assessment LUE Assessment: Within Functional Limits  See FIM for current functional status  Leroy Libman   Estelle June , MS, OTR/L, CLT Pager: [phone]  12/13/2014, 2:14 PM

## 2014-12-13 NOTE — Progress Notes (Signed)
[facility]     PROGRESS NOTE    Subjective/Complaints:  Appreciate VVS note, appreciate nephro note ROS: Pt denies , nausea, vomiting,diarrhea, chest pain, shortness of breath,no pains   Objective: Vital Signs: Blood pressure 98/48, pulse 84, temperature 98.1 F (36.7 C), temperature source Oral, resp. rate 20, weight 62.6 kg (138 lb 0.1 oz), SpO2 100 %. No results found.  Recent Labs  12/12/14 0524 12/12/14 1940  WBC 3.2* 3.2*  HGB 7.5* 7.8*  HCT 24.2* 24.8*  PLT 77* 70*    Recent Labs  12/12/14 1940  NA 132*  K 3.6  CL 99*  GLUCOSE 117*  BUN 46*  CREATININE 9.44*  CALCIUM 9.2   CBG (last 3)   Recent Labs  12/12/14 1703 12/13/14 0010 12/13/14 0648  GLUCAP 98 101* 98    Wt Readings from Last 3 Encounters:  12/13/14 62.6 kg (138 lb 0.1 oz)  12/02/14 70.1 kg (154 lb 8.7 oz)  11/08/14 74.844 kg (165 lb)    Physical Exam:  Constitutional: He is oriented to person, place, and time. He appears well-developed and well-nourished.  HENT:  Head: Normocephalic and atraumatic.  Right Ear: External ear normal.  Left Ear: External ear normal.  Eyes: Conjunctivae and EOM are normal. Pupils are equal, round, and reactive to light.  Neck: Normal range of motion.  Cardiovascular: Normal rate and normal heart sounds.  Respiratory: No respiratory distress.  GI: He exhibits no distension.  Musculoskeletal: He exhibits edema.  Marland KitchenRUE with 2+ and  LUE with 1+ +edema still present. 1+ pretib and 2+ pedal edema, Neurological: He is alert and oriented to person, place, and time.  UE: 5/5 prox to distal. RLE: 2+hf, 3 ke, 4 adf/apf, LLE: 3 hf, 3+ke, 4 adf/apf. No sensory deficits. attention/awareness improving      Skin: Skin is dry.  Psychiatric: He has a normal mood and affect. His behavior is normal.   Assessment/Plan: 1. Functional deficits secondary to debility related to right thigh hematoma which require 3+ hours per day  of interdisciplinary therapy in a comprehensive inpatient rehab setting. Physiatrist is providing close team supervision and 24 hour management of active medical problems listed below. Physiatrist and rehab team continue to assess barriers to discharge/monitor patient progress toward functional and medical goals.  Should be ready for d/c in am  FIM: FIM - Bathing Bathing Steps Patient Completed: Chest, Right Arm, Left Arm, Abdomen, Front perineal area, Buttocks, Right upper leg, Left upper leg, Right lower leg (including foot), Left lower leg (including foot) Bathing: 5: Supervision: Safety issues/verbal cues  FIM - Upper Body Dressing/Undressing Upper body dressing/undressing steps patient completed: Thread/unthread right sleeve of pullover shirt/dresss, Thread/unthread left sleeve of pullover shirt/dress, Put head through opening of pull over shirt/dress, Pull shirt over trunk Upper body dressing/undressing: 5: Set-up assist to: Obtain clothing/put away FIM - Lower Body Dressing/Undressing Lower body dressing/undressing steps patient completed: Thread/unthread right underwear leg, Thread/unthread left underwear leg, Pull underwear up/down, Thread/unthread right pants leg, Thread/unthread left pants leg, Pull pants up/down, Don/Doff right sock, Don/Doff left sock, Don/Doff right shoe, Don/Doff left shoe Lower body dressing/undressing: 5: Set-up assist to: Obtain clothing  FIM - Toileting Toileting steps completed by patient: Adjust clothing prior to toileting, Performs perineal hygiene, Adjust clothing after toileting Toileting Assistive Devices: Grab bar or rail for support Toileting: 4: Steadying assist  FIM - Radio producer Devices: Oncologist Transfers: 4-To toilet/BSC: Min A (steadying Pt. > 75%), 4-From  toilet/BSC: Min A (steadying Pt. > 75%)  FIM - Control and instrumentation engineer Devices: Best boy: 6: Supine > Sit: No  assist, 6: Sit > Supine: No assist  FIM - Locomotion: Wheelchair Distance: 300 Locomotion: Wheelchair: 0: Activity did not occur FIM - Locomotion: Ambulation Locomotion: Ambulation Assistive Devices: Administrator Ambulation/Gait Assistance: 5: Supervision Locomotion: Ambulation: 5: Travels 150 ft or more with supervision/safety issues  Comprehension Comprehension Mode: Auditory Comprehension: 6-Follows complex conversation/direction: With extra time/assistive device  Expression Expression Mode: Verbal Expression: 6-Expresses complex ideas: With extra time/assistive device  Social Interaction Social Interaction: 6-Interacts appropriately with others with medication or extra time (anti-anxiety, antidepressant).  Problem Solving Problem Solving: 5-Solves complex 90% of the time/cues < 10% of the time  Memory Memory: 6-More than reasonable amt of time  Medical Problem List and Plan: 1. Functional deficits secondary to R thigh hematoma, PAD with subsequent weakness/ gait instability 2. DVT rx (March 2016): coumadin therapeutic,  -serial INR's and dose adjustment--appreciate pharmacy assist 3. Pain Management: continue oxycodone and observe for pain tolerance with activity   4. Mood: LCSW to follow for evaluation and support. 5. Neuropsych: This patient is capable of making decisions on his own behalf. 6. Skin/Wound Care:off VAC wet to dry dressings 7. Fluids/Electrolytes/Nutrition: Monitor I/O. Labs with HD,  8. DM type2 : Monitor BS ac/hs for pattern--CBGs at goal today, but on higher side  Cont to monitor 9. Cardiac transplant: On imuran and sirolimus.  10. ESRD: HD on MWF after therapy sessions to avoid conflict with therapy times- limited draw off of fluid due to hypotension , nephro to manage 11. Constipation: bowel regimen.No incont ,   -adjust softener/laxative  LOS (Days) 11 A FACE TO FACE EVALUATION WAS PERFORMED  KIRSTEINS,ANDREW  E 12/13/2014 8:23 AM

## 2014-12-13 NOTE — Progress Notes (Signed)
ANTICOAGULATION CONSULT NOTE - Follow Up Consult  Pharmacy Consult for coumadin Indication: hx of bilateral DVT  Allergies  Allergen Reactions  . Lisinopril Swelling    Lips and tongue swell  . Niacin And Related Other (See Comments)    unknown  . Norvasc [Amlodipine Besylate] Rash    Flushing  . Penicillins Rash    Patient Measurements: Weight: 138 lb 0.1 oz (62.6 kg) Heparin Dosing Weight:   Vital Signs: Temp: 98.1 F (36.7 C) (07/19 0508) Temp Source: Oral (07/19 0508) BP: 98/48 mmHg (07/19 0508) Pulse Rate: 84 (07/19 0508)  Labs:  Recent Labs  12/11/14 0440 12/12/14 0524 12/12/14 1940 12/13/14 0448  HGB  --  7.5* 7.8*  --   HCT  --  24.2* 24.8*  --   PLT  --  77* 70*  --   LABPROT 33.0* 33.3*  --  24.0*  INR 3.32* 3.36*  --  2.17*  CREATININE  --   --  9.44*  --     Estimated Creatinine Clearance: 6.5 mL/min (by C-G formula based on Cr of 9.44).   Medications:  Scheduled:  . atorvastatin  40 mg Oral Daily  . azaTHIOprine  75 mg Oral q morning - 10a  . calcium acetate  667 mg Oral BID WC  . carvedilol  6.25 mg Oral QHS  . colesevelam  3,750 mg Oral Daily  . collagenase   Topical Daily  . darbepoetin (ARANESP) injection - DIALYSIS  200 mcg Intravenous Q Wed-HD  . doxercalciferol  4 mcg Intravenous Q M,W,F-HD  . famotidine  10 mg Oral QHS  . feeding supplement  1 Container Oral BID WC  . feeding supplement (PRO-STAT SUGAR FREE 64)  30 mL Oral BID  . insulin aspart  0-5 Units Subcutaneous QHS  . isosorbide dinitrate  40 mg Oral 3 times per day  . multivitamin  1 tablet Oral QHS  . pantoprazole  40 mg Oral Daily  . predniSONE  5 mg Oral Q breakfast  . senna-docusate  3 tablet Oral BID  . Sirolimus  1.5 mg Oral Daily  . warfarin  5 mg Oral ONCE-1800  . Warfarin - Pharmacist Dosing Inpatient   Does not apply q1800   Infusions:    Assessment: 69 yo male with hx of DVT is currently on therapeutic coumadin but INR down from 3.36 to 2.17.  Dose seemed  to have been missed last night. Goal of Therapy:  INR 2-3 Monitor platelets by anticoagulation protocol: Yes   Plan:  - coumadin 5mg  po x1 - INR in am  Jeromy Borcherding, Tsz-Yin 12/13/2014,8:34 AM

## 2014-12-13 NOTE — Patient Instructions (Signed)
Flexion (Resistive Band)   With band looped around hand and wrist, use elbow movements only. Using other arm as anchor, bend elbow, pulling up. Hold ___2_ seconds. Repeat __7-10__ times. Do __at least 3__ sessions per day. Bend elbow as pictured, then straighten all the way.   Bend elbow then straighten with resistive band. RIGHT and LEFT  Copyright  VHI. All rights reserved.   Extension (Resistive Band)   With band looped around hand and wrist, use elbow movements only. Using other arm as anchor, straighten elbow, pushing down. Hold _2___ seconds. Repeat _7-10___ times. Do at least 3  sessions per day.   Make sure you are straightening your elbow all the way and bending it with resistive band. RIGHT and LEFT Copyright  VHI. All rights reserved.   Flexion (Active)   Raise arm to point to ceiling, keeping elbow straight. Hold __2__ seconds. Repeat __7-10__ times. Do _at least 3  sessions per day. Activity: Use this motion to reach for items on overhead shelves.  Do this exercise in sitting position. Make sure thumb leads the way. RIGHT and LEFT  Copyright  VHI. All rights reserved.  Abduction (Active)   Raise arm out to side, with elbow straight and palm facing downwards. Do not shrug shoulder or tilt trunk. Hold _2___ seconds. Repeat _7-10___ times. Do _at least 3  sessions per day.  Bring arm across body, touch opposite shoulder then bring arm out to the side, keeping arm straight. RIGHT and LEFT    Copyright  VHI. All rights reserved.

## 2014-12-13 NOTE — Progress Notes (Signed)
Physical Therapy Session Note  Patient Details  Name: Brandon Sharp MRN: [id number] Date of Birth: 04-11-1946  Today's Date: 12/13/2014 PT Individual Time: 1300-1355 PT Individual Time Calculation (min): 55 min   Short Term Goals: Week 1:  PT Short Term Goal 1 (Week 1): STG =LTG      Therapy Documentation Pain: Pain Assessment Pain Assessment: No/denies pain Locomotion : Ambulation Ambulation/Gait Assistance: 5: Supervision  Trunk/Postural Assessment : Cervical Assessment Cervical Assessment:  (forward flexion of head in standing ) Thoracic Assessment Thoracic Assessment: Within Functional Limits Lumbar Assessment Lumbar Assessment: Within Functional Limits Postural Control Postural Control: Within Functional Limits  Balance: Balance Balance Assessed: Yes Dynamic Sitting Balance Dynamic Sitting - Level of Assistance: 6: Modified independent (Device/Increase time) Static Standing Balance Static Standing - Level of Assistance: 5: Stand by assistance Static Standing - Comment/# of Minutes: increased reliance of UE on RW in standing    Patient received seated in recliner chair at beginning of session and wife Pamala Hurry present. Reviewed previous family training sessions on current functional status, plan of care, goals and discharge recommendations for 24/7 supervision. Patient and spouse verbalized understanding and in agreement with recommendations. Reviewed position for transfers and ambulation with utilization of rolling walker spouse able to verbalize and demonstrate proper technique. Spouse encouraged to provide patient with cues for upright posture and position in walker, spouse verbalized understanding.   Sit to and from transfer with RW close supervision Stand pivot transfer with RW close supervision   Patient ambulated 250 feet, 175 feet, 200 feet, and 225 feet with RW close supervision. Patient requires verbal cues for upright posture, positioning in walker, and  breathing strategies.   Patient up and down 4 steps with bilateral handrails close supervision. Verbal cues on proper sequence and technique.   Spouse able to recall and demonstrate proper positioning and educate patient on stair training to simulate home entry with use of RW ascending backwards and bringing RW up then getting into home.  Performed car transfer to simulated home car height.Performed at S level with rolling walker with good recall of safe entry. Ambulated back to therapy gym for seated rest break before returning to room. Left in recliner with all needs met.    See FIM for current functional status  Therapy/Group: Individual Therapy  Retta Diones 12/13/2014, 3:41 PM

## 2014-12-13 NOTE — Progress Notes (Signed)
Nutrition Follow-up  DOCUMENTATION CODES:   Non-severe (moderate) malnutrition in context of chronic illness  INTERVENTION:   Continue Resource Breeze po BID, each supplement provides 250 kcal and 9 grams of protein.  Continue 30 ml Prostat po BID, each supplement provides 100 kcal and 15 grams of protein.   Provide nourishment snacks (sandwiches). Ordered.  Encourage adequate PO intake.   NUTRITION DIAGNOSIS:   Increased nutrient needs related to wound healing as evidenced by estimated needs; ongoing  GOAL:   Patient will meet greater than or equal to 90% of their needs; met  MONITOR:   PO intake, Supplement acceptance, Weight trends, Labs, I & O's  REASON FOR ASSESSMENT:   Malnutrition Screening Tool    ASSESSMENT:   69 y.o. male with history of DM type 2, CAD, CHF s/p Cardiac transplant, ESRD who was admitted on 11/16/14 from HD center due to profound weakness. He was found to be anemic due to right thigh hematoma s/p recent AVG placement. He was transfused with 2 units PRBC and was taken to OR on 11/21/14 for evacuation of Thigh hematoma with VAC placement. He developed right foot ischemia and pain therefore arteriogram recommended due to concerns of steal syndrome. He underwent aortogram with attempts at recanalization of R-SFA stent and evidence of medium length occlusion.  Meal completion has been 50-100%. Pt has been consuming his supplements intermittently. Intake has been adequate. Plans for pt to be discharged tomorrow.   Potassium, phosphorous WNL.   Diet Order:  Diet renal/carb modified with fluid restriction Diet-HS Snack?: Nothing; Room service appropriate?: Yes; Fluid consistency:: Thin; Fluid restriction:: 1200 mL Fluid  Skin:  Wound (see comment) (Stage II pressure ulcer on sacrum, Incision on R groin, incision on R upper arm, wounc VAC on R groin, non-pitting LE, RUE edema)  Last BM:  7/15, meds given  Height:   Ht Readings from Last 1 Encounters:   11/16/14 6' (1.829 m)    Weight:   Wt Readings from Last 1 Encounters:  12/13/14 138 lb 0.1 oz (62.6 kg)    Ideal Body Weight:  81 kg  Wt Readings from Last 10 Encounters:  12/13/14 138 lb 0.1 oz (62.6 kg)  12/02/14 154 lb 8.7 oz (70.1 kg)  11/08/14 165 lb (74.844 kg)  10/20/14 159 lb (72.122 kg)  10/11/14 161 lb (73.029 kg)  09/29/14 161 lb 9.6 oz (73.301 kg)  08/29/14 158 lb 15.2 oz (72.1 kg)  06/21/14 159 lb (72.122 kg)  05/11/14 154 lb 1.6 oz (69.9 kg)  05/03/14 158 lb 8 oz (71.895 kg)    BMI:  Body mass index is 18.71 kg/(m^2).  Estimated Nutritional Needs:   Kcal:  2100-2300  Protein:  105-120 grams  Fluid:  1.2 L/day  EDUCATION NEEDS:   No education needs identified at this time  Corrin Parker, MS, RD, LDN Pager # [phone] After hours/ weekend pager # [phone]

## 2014-12-13 NOTE — Progress Notes (Signed)
[facility] Progress Note  Assessment/Plan: 1. ESRD - MWF HD; first in AM tomorrow 2/2 DC.  No heparin, just warfarin. TDC 2. Vol excess - Now EDW 65kg 3. Dehisced R thigh AVG wound w hematoma , s/p hematoma evacuation and wound VAC Off abx, cx's neg. 4. RLE arterial insufficiency - s/p angio / per Dr.Chen- not likely to be responsive to endovascular intervention- Plan for now is observation 5. Anemia - Hb increase some, check in AM. May need transfusion before DC home 12/14/14. Follow CBC.  6. HTN controlled on coreg 6.25 qhs and tid Isordil 40mg , new lower EDW 7. Metabolic bone disease - Hectorol 4. Phos 4. Reduced ca acetate BID with meals this admission 8. 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 - INR 2.39  9. CAD / remote CABG / heart transplant 2001- po meds Imuran, Rapamune, Prednisone 10. Dm type 2- per primary service 11. Malnutrition - alb mid 2's - renal diet/vits/supplements 12. Thrombocytopenia - increases bleeding risk  Disposition - DC date set for July 20th. Patient very happy to be going home.  Subjective:  No new issues. DC tomorrow  Objective Filed Vitals:   12/12/14 2350 12/13/14 0015 12/13/14 0508 12/13/14 1300  BP: 116/52 125/56 98/48 122/59  Pulse: 87 85 84 88  Temp:  97.6 F (36.4 C) 98.1 F (36.7 C) 97.5 F (36.4 C)  TempSrc:  Oral Oral Oral  Resp: 16 18 20 18   Weight:   62.6 kg (138 lb 0.1 oz)   SpO2:  100% 100% 98%   Physical Exam General: Alert, very pleasant male in NAD.  Heart: S1,S2, RRR No R/G/R Lungs: Bilateral breath sounds CTA Abdomen: soft, nontender with active BS. Extremities: Wound Vac R. Femoral area. Trace edema LE.  Dialysis Access: L perm cath. AVG R Femoral with wound vac. RUA AVF as described above.  Dialysis Orders: [facility] MWF 4 hours 72kg 2/2 bath 4hr Heparin 3000 (no hep at dc, on coumadin) Hectorol 4  Aranesp 200 q Wed no Fe  Additional Objective Labs: Basic Metabolic Panel:  Recent Labs Lab  12/07/14 1740 12/09/14 1517 12/12/14 1940  NA 134* 135 132*  K 3.8 3.2* 3.6  CL 100* 101 99*  CO2 25 24 23   GLUCOSE 100* 145* 117*  BUN 29* 23* 46*  CREATININE 7.37* 6.54* 9.44*  CALCIUM 9.3 9.5 9.2  PHOS 3.8 2.8 3.7   Liver Function Tests:  Recent Labs Lab 12/07/14 1740 12/09/14 1517 12/12/14 1940  ALBUMIN 2.8* 2.8* 2.9*   No results for input(s): LIPASE, AMYLASE in the last 168 hours. CBC:  Recent Labs Lab 12/08/14 0507 12/10/14 0517 12/12/14 0524 12/12/14 1940  WBC 3.4* 4.2 3.2* 3.2*  HGB 8.4* 8.4* 7.5* 7.8*  HCT 26.9* 26.6* 24.2* 24.8*  MCV 92.4 93.3 92.4 91.2  PLT 66* 76* 77* 70*   Blood Culture    Component Value Date/Time   SDES WOUND GROIN RIGHT 11/21/2014 1007   SDES WOUND GROIN RIGHT 11/21/2014 1007   SPECREQUEST RIGHT GROIN HEMATOMA PT ON VANCOMYCIN 11/21/2014 1007   SPECREQUEST RIGHT GROIN HEMATOMA PT ON VANCOMYCIN 11/21/2014 1007   CULT  11/21/2014 1007    NO ANAEROBES ISOLATED Performed at [facility]  11/21/2014 1007    NO GROWTH 3 DAYS Performed at [facility] 11/26/2014 FINAL 11/21/2014 1007   REPTSTATUS 11/24/2014 FINAL 11/21/2014 1007    Cardiac Enzymes: No results for input(s): CKTOTAL, CKMB, CKMBINDEX, TROPONINI in  the last 168 hours. CBG:  Recent Labs Lab 12/12/14 1218 12/12/14 1703 12/13/14 0010 12/13/14 0648 12/13/14 1149  GLUCAP 104* 98 101* 98 99   Iron Studies: No results for input(s): IRON, TIBC, TRANSFERRIN, FERRITIN in the last 72 hours. @lablastinr3 @ Studies/Results: No results found. Medications:   . atorvastatin  40 mg Oral Daily  . azaTHIOprine  75 mg Oral q morning - 10a  . calcium acetate  667 mg Oral BID WC  . carvedilol  6.25 mg Oral QHS  . colesevelam  3,750 mg Oral Daily  . darbepoetin (ARANESP) injection - DIALYSIS  200 mcg Intravenous Q Wed-HD  . doxercalciferol  4 mcg Intravenous Q M,W,F-HD  . famotidine  10 mg Oral QHS  . feeding supplement  1  Container Oral BID WC  . feeding supplement (PRO-STAT SUGAR FREE 64)  30 mL Oral BID  . insulin aspart  0-5 Units Subcutaneous QHS  . isosorbide dinitrate  40 mg Oral 3 times per day  . multivitamin  1 tablet Oral QHS  . pantoprazole  40 mg Oral Daily  . predniSONE  5 mg Oral Q breakfast  . senna-docusate  3 tablet Oral BID  . Sirolimus  1.5 mg Oral Daily  . warfarin  5 mg Oral ONCE-1800  . Warfarin - Pharmacist Dosing Inpatient   Does not apply [phone]

## 2014-12-13 NOTE — Progress Notes (Signed)
   Daily Progress Note  Assessment/Planning: s/p R thigh AVG, washout R groin, BLE PAD, s/p heart transplant   Cont wet-to-dry dressing to R groin BID  Normally, this incision should be closed in 2 weeks but with immunosuppression for heart transplant might still be unhealed  Pt denies any rest pain but notes some numbness in R foot   Subjective    Moving around better  Objective Filed Vitals:   12/12/14 2331 12/12/14 2350 12/13/14 0015 12/13/14 0508  BP: 113/60 116/52 125/56 98/48  Pulse: 92 87 85 84  Temp: 97.4 F (36.3 C)  97.6 F (36.4 C) 98.1 F (36.7 C)  TempSrc: Oral  Oral Oral  Resp: 17 16 18 20   Weight: 144 lb 6.4 oz (65.5 kg)   138 lb 0.1 oz (62.6 kg)  SpO2: 99%  100% 100%    Intake/Output Summary (Last 24 hours) at 12/13/14 0811 Last data filed at 12/12/14 2331  Gross per 24 hour  Intake    720 ml  Output   2008 ml  Net  -1288 ml   VASC  R groin clean, wound has contracted with good granulation, subcutaneous tissue nearly at skin level, +bruit in thigh AVG  Laboratory CBC    Component Value Date/Time   WBC 3.2* 12/12/2014 1940   WBC 4.5 02/13/2012 1314   HGB 7.8* 12/12/2014 1940   HGB 11.6* 02/13/2012 1314   HCT 24.8* 12/12/2014 1940   HCT 36.1* 02/13/2012 1314   PLT 70* 12/12/2014 1940   PLT 76* 02/13/2012 1314    BMET    Component Value Date/Time   NA 132* 12/12/2014 1940   K 3.6 12/12/2014 1940   CL 99* 12/12/2014 1940   CO2 23 12/12/2014 1940   GLUCOSE 117* 12/12/2014 1940   BUN 46* 12/12/2014 1940   CREATININE 9.44* 12/12/2014 1940   CALCIUM 9.2 12/12/2014 1940   GFRNONAA 5* 12/12/2014 1940   GFRAA 6* 12/12/2014 1940    Adele Barthel, MD [facility]: [phone] Pager: [phone]  12/13/2014, 8:11 AM

## 2014-12-13 NOTE — Progress Notes (Signed)
Occupational Therapy Session Note  Patient Details  Name: Brandon Sharp MRN: [id number] Date of Birth: 03/27/1946  Today's Date: 12/13/2014 OT Individual Time: 0900-1000 OT Individual Time Calculation (min): 60 min    Short Term Goals: Week 1:  OT Short Term Goal 1 (Week 1): STGs = LTGs  Skilled Therapeutic Interventions/Progress Updates:    Pt seen for 1:1 OT session with a focus on ADL retraining, functional transfers, functional mobility, UE/LE strengthening, activity tolerance, pt education, and dynamic standing balance. Pt received seated in w/c agreeable to participate in therapy session and with no complaint of pain this AM. Pt stating that he already performed B/D this AM at supervision level. Pt ambulated to BR via RW at completed toilet transfer with supervision. Pt completed toileting at supervision level also. Pt propelled w/c apprx 150' from room to therapy gym with increased time due to fatigue level. Pt engaged in dynamic standing balance activity of horseshoes at supervision level. Activity graded to incorporate foam block to challenge balance and pt engaged in activity 3x for apprx 3-4 min each. Pt then engaged in NuStep at level 4 for 8 min for UE/LE strengthening and overall endurance. Therapist discussed ways to conserve energy at home but continue to remain as active as possible. Overall, pt demonstrates good safety awareness and carryover of body positioning during transitional movements. Pt left seated in w/c awaiting next therapy session.   Therapy Documentation Precautions:  Precautions Precautions: Fall Precaution Comments: Wound vac on R groin  Restrictions Weight Bearing Restrictions: No General:   Vital Signs: Therapy Vitals Temp: 98.1 F (36.7 C) Temp Source: Oral Pulse Rate: 84 Resp: 20 BP: (!) 98/48 mmHg Patient Position (if appropriate): Lying Oxygen Therapy SpO2: 100 % O2 Device: Not Delivered Pain:   ADL: ADL ADL Comments: refer to  FIM Exercises:   Other Treatments:    See FIM for current functional status  Therapy/Group: Individual Therapy  Dorann Ou 12/13/2014, 9:00 AM

## 2014-12-14 ENCOUNTER — Inpatient Hospital Stay (HOSPITAL_COMMUNITY): Payer: Medicare Other | Admitting: Rehabilitation

## 2014-12-14 LAB — RENAL FUNCTION PANEL
ALBUMIN: 2.8 g/dL — AB (ref 3.5–5.0)
ANION GAP: 10 (ref 5–15)
BUN: 35 mg/dL — ABNORMAL HIGH (ref 6–20)
CO2: 25 mmol/L (ref 22–32)
CREATININE: 6.36 mg/dL — AB (ref 0.61–1.24)
Calcium: 9 mg/dL (ref 8.9–10.3)
Chloride: 96 mmol/L — ABNORMAL LOW (ref 101–111)
GFR calc non Af Amer: 8 mL/min — ABNORMAL LOW (ref >60)
GFR, EST AFRICAN AMERICAN: 9 mL/min — AB (ref >60)
Glucose, Bld: 95 mg/dL (ref 65–99)
Phosphorus: 4.2 mg/dL (ref 2.5–4.6)
Potassium: 3.8 mmol/L (ref 3.5–5.1)
SODIUM: 131 mmol/L — AB (ref 135–145)

## 2014-12-14 LAB — CBC
HEMATOCRIT: 23.9 % — AB (ref 39.0–52.0)
HEMOGLOBIN: 7.7 g/dL — AB (ref 13.0–17.0)
MCH: 29.7 pg (ref 26.0–34.0)
MCHC: 32.2 g/dL (ref 30.0–36.0)
MCV: 92.3 fL (ref 78.0–100.0)
Platelets: 71 10*3/uL — ABNORMAL LOW (ref 150–400)
RBC: 2.59 MIL/uL — AB (ref 4.22–5.81)
RDW: 18.6 % — ABNORMAL HIGH (ref 11.5–15.5)
WBC: 3.3 10*3/uL — ABNORMAL LOW (ref 4.0–10.5)

## 2014-12-14 LAB — PROTIME-INR
INR: 1.88 — AB (ref 0.00–1.49)
PROTHROMBIN TIME: 21.5 s — AB (ref 11.6–15.2)

## 2014-12-14 LAB — GLUCOSE, CAPILLARY
GLUCOSE-CAPILLARY: 100 mg/dL — AB (ref 65–99)
GLUCOSE-CAPILLARY: 75 mg/dL (ref 65–99)

## 2014-12-14 MED ORDER — SENNOSIDES-DOCUSATE SODIUM 8.6-50 MG PO TABS: 3.0000 | ORAL_TABLET | Freq: Two times a day (BID) | ORAL | Status: AC

## 2014-12-14 MED ORDER — OXYCODONE HCL 5 MG PO TABS: 5.0000 mg | ORAL_TABLET | Freq: Four times a day (QID) | ORAL | Status: AC | PRN

## 2014-12-14 MED ORDER — WARFARIN SODIUM 5 MG PO TABS
5.0000 mg | ORAL_TABLET | Freq: Once | ORAL | Status: AC
  Administered 2014-12-14: 5 mg via ORAL
  Filled 2014-12-14: qty 1

## 2014-12-14 MED ORDER — DARBEPOETIN ALFA 200 MCG/0.4ML IJ SOSY
PREFILLED_SYRINGE | INTRAMUSCULAR | Status: AC
  Administered 2014-12-14: 200 ug via INTRAVENOUS
  Filled 2014-12-14: qty 0.4

## 2014-12-14 MED ORDER — FAMOTIDINE 10 MG PO TABS: 10.0000 mg | ORAL_TABLET | Freq: Every day | ORAL | Status: DC

## 2014-12-14 MED ORDER — DOXERCALCIFEROL 4 MCG/2ML IV SOLN
INTRAVENOUS | Status: AC
  Administered 2014-12-14: 4 ug via INTRAVENOUS
  Filled 2014-12-14: qty 2

## 2014-12-14 MED ORDER — CALCIUM ACETATE 667 MG PO CAPS: 667.0000 mg | ORAL_CAPSULE | Freq: Two times a day (BID) | ORAL | Status: AC

## 2014-12-14 NOTE — Progress Notes (Signed)
Physical Therapy Discharge Summary  Patient Details  Name: Brandon Sharp MRN: [id number] Date of Birth: 1946/01/26    Patient has met 4 of 4 long term goals due to improved activity tolerance, improved balance, improved postural control, increased strength and decreased pain.  Patient to discharge at an ambulatory level Supervision.   Patient's care partner is independent to provide the necessary physical and cognitive assistance at discharge.    Recommendation:  Patient will benefit from ongoing skilled PT services in home health setting to continue to advance safe functional mobility, address ongoing impairments in endurance, functional mobility with progression to less restrictive assistive device, and minimize fall risk.  Equipment: Rolling walker  Reasons for discharge: treatment goals met  Patient/family agrees with progress made and goals achieved: Yes  PT Discharge Pain Pain Assessment Pain Assessment: No/denies pain    Cognition Overall Cognitive Status: Within Functional Limits for tasks assessed Arousal/Alertness: Awake/alert Orientation Level: Oriented X4 Attention: Alternating Focused Attention: Appears intact Alternating Attention: Appears intact Memory: Appears intact Memory Impairment: Retrieval deficit Awareness: Appears intact Problem Solving: Appears intact   Motor  Motor Motor: Within Functional Limits  Mobility Bed Mobility Bed Mobility: Sit to Supine Supine to Sit: 6: Modified independent (Device/Increase time) Transfers Transfers: Yes Sit to Stand: 5: Supervision Stand to Sit: 5: Supervision Stand Pivot Transfers: 5: Supervision Locomotion  Ambulation Ambulation: Yes Ambulation/Gait Assistance: 5: Supervision Ambulation Distance (Feet): 200 Feet Assistive device: Rolling walker Gait Gait: Yes Gait Pattern: Impaired Gait velocity: decreased Stairs / Additional Locomotion Stairs: Yes Stairs Assistance: 5: Supervision Stair  Management Technique: Two rails Number of Stairs: 4   Balance Static Standing Balance Static Standing - Balance Support: Bilateral upper extremity supported Static Standing - Level of Assistance: 5: Stand by assistance Static Standing - Comment/# of Minutes: increased reliance of UE on RW Extremity Assessment  RUE Assessment RUE Assessment: Within Functional Limits LUE Assessment LUE Assessment: Within Functional Limits RLE Assessment RLE Assessment: Within Functional Limits LLE Assessment LLE Assessment: Within Functional Limits  See FIM for current functional status  Retta Diones 12/14/2014, 9:17 AM

## 2014-12-14 NOTE — Discharge Instructions (Signed)
Inpatient Rehab Discharge Instructions  Brandon Sharp Discharge date and time:  12/14/14  Activities/Precautions/ Functional Status: Activity: activity as tolerated Diet: renal diet 1200 cc /fluid day Wound Care: Twice a day cleanse area with soap and water. Pat dry. Apply damp to dry dressing to groin wound. .   Functional status:  ___ No restrictions     ___ Walk up steps independently _X__ 24/7 supervision/assistance   ___ Walk up steps with assistance ___ Intermittent supervision/assistance  ___ Bathe/dress independently _X__ Walk with walker     ___ Bathe/dress with assistance ___ Walk Independently    ___ Shower independently ___ Walk with assistance    ___ Shower with assistance _X__ No alcohol     ___ Return to work/school ________  COMMUNITY REFERRALS UPON DISCHARGE:   Home Health:   PT     OT     RN  Agency:  [facility] Phone:  [phone] Medical Equipment/Items Ordered:  Rolling Walker  Agency/Supplier:  [facility]      Phone:  [phone]  Special Instructions:    My questions have been answered and I understand these instructions. I will adhere to these goals and the provided educational materials after my discharge from the hospital.  Patient/Caregiver Signature _______________________________ Date __________  Clinician Signature _______________________________________ Date __________  Please bring this form and your medication list with you to all your follow-up doctor's appointments.

## 2014-12-14 NOTE — Procedures (Signed)
I was present at this dialysis session. I have reviewed the session itself and made appropriate changes.   Pre weight 66kg, some edema, challenge to 64kg, will set post weight as DC EDW.  Hb 7.7; see no need for transfusion.  For aranesp today before DC.     Pearson Grippe  MD 12/14/2014, 8:35 AM

## 2014-12-14 NOTE — Progress Notes (Addendum)
Social Work Discharge Note  The overall goal for the admission was met for:   Discharge location: Yes - home  Length of Stay: Yes - 12 days  Discharge activity level: Yes - supervision  Home/community participation: Yes  Services provided included: MD, RD, PT, OT, RN, Pharmacy and SW  Financial Services: Medicare and Private Insurance: [employer]  Follow-up services arranged: Home Health: PT/OT/RN, DME: rolling walker and Patient/Family request agency HH: [facility], DME: [facility]  Comments (or additional information):  Pt to d/c to his home with his wife for supervision and coordination of care.  Pt will return to his [facility] on Friday.  Pt will have [facility] for PT/OT/RN for wound care.  Pt received a rolling walker.  Pt went home with his wound VAC, even though he doesn't need it any longer, because it has been billed to him and he needs to return it.  CSW called [facility] and they will call the pt to tell him how to return it.  Patient/Family verbalized understanding of follow-up arrangements: Yes  Individual responsible for coordination of the follow-up plan: pt with assistance from his wife and family  Confirmed correct DME delivered: Trey Sailors 12/14/2014    Elif Yonts, Silvestre Mesi

## 2014-12-14 NOTE — Progress Notes (Signed)
Pt discharged to home with daughter. Pt's dressing changed. Walker and wound vac equipment sent home with pt.

## 2014-12-14 NOTE — Discharge Summary (Addendum)
Physician Discharge Summary  Patient ID: Brandon Sharp MRN: [id number] DOB/AGE: 1946-05-15 69 y.o.  Admit date: 12/02/2014 Discharge date: 12/14/2014  Discharge Diagnoses:  Principal Problem:   Physical deconditioning Active Problems:   Heart transplanted   HTN (hypertension), malignant   DM (diabetes mellitus), type 2 with renal complications   ESRD on dialysis   Hematoma of right thigh   Discharged Condition: Stable  Significant Diagnostic Studies: No results found.   Labs:  Basic Metabolic Panel:  Recent Labs Lab 12/07/14 1740 12/09/14 1517 12/12/14 1940 12/14/14 0738  NA 134* 135 132* 131*  K 3.8 3.2* 3.6 3.8  CL 100* 101 99* 96*  CO2 25 24 23 25   GLUCOSE 100* 145* 117* 95  BUN 29* 23* 46* 35*  CREATININE 7.37* 6.54* 9.44* 6.36*  CALCIUM 9.3 9.5 9.2 9.0  PHOS 3.8 2.8 3.7 4.2    CBC:  Recent Labs Lab 12/12/14 0524 12/12/14 1940 12/14/14 0510  WBC 3.2* 3.2* 3.3*  HGB 7.5* 7.8* 7.7*  HCT 24.2* 24.8* 23.9*  MCV 92.4 91.2 92.3  PLT 77* 70* 71*    CBG:  Recent Labs Lab 12/13/14 1149 12/13/14 1649 12/13/14 2052 12/14/14 0638 12/14/14 1159  GLUCAP 99 99 122* 100* 75     Brief HPI:   Brandon Sharp is a 69 y.o. male with history of DM type 2, CAD, CHF s/p Cardiac transplant, ESRD who was admitted on 11/16/14 from HD center due to profound weakness. He was found to be anemic due to right thigh hematoma s/p recent AVG placement. He was transfused with 2 units PRBC and was taken to OR on 11/21/14 for evacuation of Thigh hematoma with VAC placement. He developed right foot ischemia and he underwent aortogram revealing two areas of CTO in distal SFA and high grade PA stenosis. Patient has elected on observation for now v/s R-FPBG or ligation of RLE AVG. Therapy ongoing and CIR recommended due to deconditioned state.    Hospital Course: [name] was admitted to rehab 12/02/2014 for inpatient therapies to consist of PT, ST and OT at least three  hours five days a week. Past admission physiatrist, therapy team and rehab RN have worked together to provide customized collaborative inpatient rehab. Po intake has improved and adjustment of bowel regimen. GI symptoms have resolved and activity levels have gradually improved. Diabetes has been monitored with ac/hs checks and SSI was used to manage BS. Patient was advised to continue holding lantus for now and use SSI per home regimen. He was advised to monitor blood sugars  tid-qid and is follow up with endocrinology for input if BS start trending over 120.  Wound VAC showed minimal drainage and was discontinued on 07/15. Wound bed shows healthy beefy red tissue and VAC was changed to wet to dry dressing bid. Wound is clean, dry and healing well. He is to continue bid dressing changes past discharge. HD has been ongoing past therapy sessions to help with activity tolerance and labs were monitored per protocol. He was transfused with one unit PRBC for ABLA on 07/08. Blood pressures have been monitored on bid basis and have been controlled on isordil tid and coreg qhs. He is to continue on home dose coumadin 5 mg daily and to have repeat protime checked on HD on 07/22 with results to Dr. Melford Aase.  Patient has made steady progress during his rehab stay and is discharged at supervision level.    Rehab course: During patient's stay in rehab weekly team  conferences were held to monitor patient's progress, set goals and discuss barriers to discharge.  At admission, patient required min assist with mobility and transfers. He has had improvement in activity tolerance, balance, postural control, as well as ability to compensate for deficits. He is able to complete ADL tasks and ambulate with supervision. He is able to ambulate 200 feet with RW and supervision. Family education was done with wife who will assist as needed past discharge.    Disposition: Home  Diet: Renal diet.   Special Instructions: 1. INR to be  drawn at HD on Friday 07/22 with results to Dr. Melford Aase.  2. Cleanse right groin wound with soap and water. Pat dry and apply damp to dry dressing twice a day. Contact MD if you notice any drainage, swelling, purulent drainage, fevers or chills 3. Check blood sugars 3-4 times a day and contact MD if BS start ranging over 120.     Medication List    STOP taking these medications        BD PEN NEEDLE NANO U/F 32G X 4 MM Misc  Generic drug:  Insulin Pen Needle     heparin 100-0.45 UNIT/ML-% infusion     insulin aspart 100 UNIT/ML injection  Commonly known as:  novoLOG     insulin glargine 100 UNIT/ML injection  Commonly known as:  LANTUS      TAKE these medications        atorvastatin 40 MG tablet  Commonly known as:  LIPITOR  Take 40 mg by mouth daily.     azaTHIOprine 50 MG tablet  Commonly known as:  IMURAN  Take 75 mg by mouth every morning.     b complex-vitamin c-folic acid 0.8 MG Tabs tablet  Take 1 tablet by mouth at bedtime.     B-D INS SYR HALF-UNIT .3CC/31G 31G X 5/16" 0.3 ML Misc  Generic drug:  Insulin Syringe-Needle U-100  USE AS DIRECTED UP TO 2-4 TIMES A DAY     calcium acetate 667 MG capsule  Commonly known as:  PHOSLO  Take 1 capsule (667 mg total) by mouth 2 (two) times daily with a meal.     carvedilol 6.25 MG tablet  Commonly known as:  COREG  Take 1 tablet (6.25 mg total) by mouth at bedtime.     colesevelam 625 MG tablet  Commonly known as:  WELCHOL  Take 3,750 mg by mouth daily. Takes 6 tabs daily     famotidine 10 MG tablet  Commonly known as:  PEPCID  Take 1 tablet (10 mg total) by mouth at bedtime.     HUMALOG KWIKPEN 100 UNIT/ML KiwkPen  Generic drug:  insulin lispro  Inject 4-10 Units into the skin 2 (two) times daily. Per sliding scale     isosorbide dinitrate 40 MG CR tablet  Commonly known as:  ISOCHRON  Take 40 mg by mouth every 8 (eight) hours.     oxyCODONE 5 MG immediate release tablet--Rx # 30 pills  Commonly known as:   Oxy IR/ROXICODONE  Take 1 tablet (5 mg total) by mouth every 6 (six) hours as needed for moderate pain.     pantoprazole 40 MG tablet  Commonly known as:  PROTONIX  Take 1 tablet (40 mg total) by mouth daily.     predniSONE 5 MG tablet  Commonly known as:  DELTASONE  Take 1 tablet (5 mg total) by mouth daily with breakfast.     senna-docusate 8.6-50 MG per tablet  Commonly  known as:  Senokot-S  Take 3 tablets by mouth 2 (two) times daily.     Sirolimus 0.5 MG Tabs  Take 3 tablets by mouth daily.     warfarin 5 MG tablet  Commonly known as:  COUMADIN  Take one tablet daily in evening starting tomorrow           Follow-up Information    Call Charlett Blake, MD.   Specialty:  Physical Medicine and Rehabilitation   Why:  As needed   Contact information:   [place] [address] [phone]       Follow up with [name], MD On 12/29/2014.   Specialty:  Family Medicine   Why:  @ noon   Contact information:   [place] [address] [phone]       Follow up with Ruta Hinds, MD On 12/16/2014.   Specialties:  Vascular Surgery, Cardiology   Why:  appointment at 8:45 am   Contact information:   [address] [place] [address] [phone]       Signed: Bary Leriche 12/14/2014, 1:19 PM

## 2014-12-14 NOTE — Progress Notes (Signed)
ANTICOAGULATION CONSULT NOTE - Follow Up Consult  Pharmacy Consult for coumadin Indication: hx of bilateral DVT  Allergies  Allergen Reactions  . Lisinopril Swelling    Lips and tongue swell  . Niacin And Related Other (See Comments)    unknown  . Norvasc [Amlodipine Besylate] Rash    Flushing  . Penicillins Rash    Patient Measurements: Weight: 145 lb 8.1 oz (66 kg) Heparin Dosing Weight:   Vital Signs: Temp: 97.8 F (36.6 C) (07/20 0703) Temp Source: Oral (07/20 0703) BP: 105/50 mmHg (07/20 0948) Pulse Rate: 81 (07/20 0948)  Labs:  Recent Labs  12/12/14 0524 12/12/14 1940 12/13/14 0448 12/14/14 0510 12/14/14 0738  HGB 7.5* 7.8*  --  7.7*  --   HCT 24.2* 24.8*  --  23.9*  --   PLT 77* 70*  --  71*  --   LABPROT 33.3*  --  24.0* 21.5*  --   INR 3.36*  --  2.17* 1.88*  --   CREATININE  --  9.44*  --   --  6.36*    Estimated Creatinine Clearance: 10.2 mL/min (by C-G formula based on Cr of 6.36).   Medications:  Scheduled:  . atorvastatin  40 mg Oral Daily  . azaTHIOprine  75 mg Oral q morning - 10a  . calcium acetate  667 mg Oral BID WC  . carvedilol  6.25 mg Oral QHS  . colesevelam  3,750 mg Oral Daily  . Darbepoetin Alfa      . darbepoetin (ARANESP) injection - DIALYSIS  200 mcg Intravenous Q Wed-HD  . doxercalciferol      . doxercalciferol  4 mcg Intravenous Q M,W,F-HD  . famotidine  10 mg Oral QHS  . feeding supplement  1 Container Oral BID WC  . feeding supplement (PRO-STAT SUGAR FREE 64)  30 mL Oral BID  . insulin aspart  0-5 Units Subcutaneous QHS  . isosorbide dinitrate  40 mg Oral 3 times per day  . multivitamin  1 tablet Oral QHS  . pantoprazole  40 mg Oral Daily  . predniSONE  5 mg Oral Q breakfast  . senna-docusate  3 tablet Oral BID  . Sirolimus  1.5 mg Oral Daily  . Warfarin - Pharmacist Dosing Inpatient   Does not apply q1800   Infusions:    Assessment: 69 yo male with hx of DVT is currently on therapeutic coumadin but INR down  from 3.36 to 1.88.  Dose was held 7/17 due to elevated INR, and dose appears to be missed 7/18.  No bleeding or complications noted.  Goal of Therapy:  INR 2-3 Monitor platelets by anticoagulation protocol: Yes   Plan:  - Coumadin 5mg  po x1.  Would recommend 5 mg daily at discharge, but would ideally like to check INR in a few days as an outpatient since it has been variable. - INR in am  Uvaldo Rising, BCPS  Clinical Pharmacist Pager [phone]  12/14/2014 10:02 AM

## 2014-12-15 ENCOUNTER — Telehealth: Payer: Self-pay | Admitting: Vascular Surgery

## 2014-12-15 NOTE — Telephone Encounter (Signed)
-----   Message from Mena Goes, RN sent at 12/13/2014  8:55 AM EDT ----- Regarding: Schedule   ----- Message -----    From: Conrad Lindsay, MD    Sent: 12/13/2014   8:14 AM      To: [address]  ZAYVIEN CANNING [phone] 1945/06/04   Follow-up: 2 weeks s/p R groin washout

## 2014-12-15 NOTE — Telephone Encounter (Signed)
When I tried to give appt information to patient he stated "I can't get rest at home either" and hung up. Mailed letter, dpm

## 2014-12-16 ENCOUNTER — Ambulatory Visit: Payer: Medicare Other | Admitting: Vascular Surgery

## 2014-12-22 ENCOUNTER — Inpatient Hospital Stay (HOSPITAL_COMMUNITY)
Admission: EM | Admit: 2014-12-22 | Discharge: 2014-12-31 | DRG: 862 | Disposition: A | Discharge status: Home Health | Payer: Medicare Other | Attending: Internal Medicine | Admitting: Internal Medicine

## 2014-12-22 ENCOUNTER — Encounter (HOSPITAL_COMMUNITY): Payer: Self-pay | Admitting: *Deleted

## 2014-12-22 ENCOUNTER — Emergency Department (HOSPITAL_COMMUNITY): Payer: Medicare Other

## 2014-12-22 DIAGNOSIS — IMO0002 Reserved for concepts with insufficient information to code with codable children: Secondary | ICD-10-CM | POA: Insufficient documentation

## 2014-12-22 DIAGNOSIS — K922 Gastrointestinal hemorrhage, unspecified: Secondary | ICD-10-CM | POA: Insufficient documentation

## 2014-12-22 DIAGNOSIS — R52 Pain, unspecified: Secondary | ICD-10-CM

## 2014-12-22 DIAGNOSIS — Z86718 Personal history of other venous thrombosis and embolism: Secondary | ICD-10-CM

## 2014-12-22 DIAGNOSIS — K626 Ulcer of anus and rectum: Secondary | ICD-10-CM | POA: Diagnosis present

## 2014-12-22 DIAGNOSIS — A419 Sepsis, unspecified organism: Secondary | ICD-10-CM | POA: Diagnosis present

## 2014-12-22 DIAGNOSIS — Z992 Dependence on renal dialysis: Secondary | ICD-10-CM

## 2014-12-22 DIAGNOSIS — N186 End stage renal disease: Secondary | ICD-10-CM | POA: Diagnosis present

## 2014-12-22 DIAGNOSIS — Z66 Do not resuscitate: Secondary | ICD-10-CM | POA: Diagnosis present

## 2014-12-22 DIAGNOSIS — K5900 Constipation, unspecified: Secondary | ICD-10-CM | POA: Diagnosis present

## 2014-12-22 DIAGNOSIS — I120 Hypertensive chronic kidney disease with stage 5 chronic kidney disease or end stage renal disease: Secondary | ICD-10-CM | POA: Diagnosis present

## 2014-12-22 DIAGNOSIS — I10 Essential (primary) hypertension: Secondary | ICD-10-CM | POA: Insufficient documentation

## 2014-12-22 DIAGNOSIS — K5641 Fecal impaction: Secondary | ICD-10-CM | POA: Diagnosis present

## 2014-12-22 DIAGNOSIS — Z87891 Personal history of nicotine dependence: Secondary | ICD-10-CM

## 2014-12-22 DIAGNOSIS — I252 Old myocardial infarction: Secondary | ICD-10-CM

## 2014-12-22 DIAGNOSIS — Z833 Family history of diabetes mellitus: Secondary | ICD-10-CM

## 2014-12-22 DIAGNOSIS — L8990 Pressure ulcer of unspecified site, unspecified stage: Secondary | ICD-10-CM | POA: Insufficient documentation

## 2014-12-22 DIAGNOSIS — Z79899 Other long term (current) drug therapy: Secondary | ICD-10-CM

## 2014-12-22 DIAGNOSIS — N2581 Secondary hyperparathyroidism of renal origin: Secondary | ICD-10-CM | POA: Diagnosis present

## 2014-12-22 DIAGNOSIS — E1165 Type 2 diabetes mellitus with hyperglycemia: Secondary | ICD-10-CM | POA: Diagnosis present

## 2014-12-22 DIAGNOSIS — Z941 Heart transplant status: Secondary | ICD-10-CM

## 2014-12-22 DIAGNOSIS — Z955 Presence of coronary angioplasty implant and graft: Secondary | ICD-10-CM

## 2014-12-22 DIAGNOSIS — T814XXA Infection following a procedure, initial encounter: Secondary | ICD-10-CM | POA: Diagnosis not present

## 2014-12-22 DIAGNOSIS — K625 Hemorrhage of anus and rectum: Secondary | ICD-10-CM | POA: Insufficient documentation

## 2014-12-22 DIAGNOSIS — A4902 Methicillin resistant Staphylococcus aureus infection, unspecified site: Secondary | ICD-10-CM | POA: Diagnosis present

## 2014-12-22 DIAGNOSIS — Z794 Long term (current) use of insulin: Secondary | ICD-10-CM

## 2014-12-22 DIAGNOSIS — Z7901 Long term (current) use of anticoagulants: Secondary | ICD-10-CM

## 2014-12-22 DIAGNOSIS — I82409 Acute embolism and thrombosis of unspecified deep veins of unspecified lower extremity: Secondary | ICD-10-CM | POA: Diagnosis present

## 2014-12-22 DIAGNOSIS — I959 Hypotension, unspecified: Secondary | ICD-10-CM | POA: Diagnosis not present

## 2014-12-22 DIAGNOSIS — N185 Chronic kidney disease, stage 5: Secondary | ICD-10-CM | POA: Diagnosis present

## 2014-12-22 DIAGNOSIS — A4102 Sepsis due to Methicillin resistant Staphylococcus aureus: Secondary | ICD-10-CM | POA: Insufficient documentation

## 2014-12-22 DIAGNOSIS — N189 Chronic kidney disease, unspecified: Secondary | ICD-10-CM

## 2014-12-22 DIAGNOSIS — R109 Unspecified abdominal pain: Secondary | ICD-10-CM | POA: Diagnosis not present

## 2014-12-22 DIAGNOSIS — Z8249 Family history of ischemic heart disease and other diseases of the circulatory system: Secondary | ICD-10-CM

## 2014-12-22 DIAGNOSIS — Y838 Other surgical procedures as the cause of abnormal reaction of the patient, or of later complication, without mention of misadventure at the time of the procedure: Secondary | ICD-10-CM | POA: Diagnosis present

## 2014-12-22 DIAGNOSIS — D72819 Decreased white blood cell count, unspecified: Secondary | ICD-10-CM | POA: Insufficient documentation

## 2014-12-22 DIAGNOSIS — I739 Peripheral vascular disease, unspecified: Secondary | ICD-10-CM | POA: Diagnosis present

## 2014-12-22 DIAGNOSIS — M898X9 Other specified disorders of bone, unspecified site: Secondary | ICD-10-CM | POA: Diagnosis present

## 2014-12-22 DIAGNOSIS — Z951 Presence of aortocoronary bypass graft: Secondary | ICD-10-CM

## 2014-12-22 DIAGNOSIS — Z7952 Long term (current) use of systemic steroids: Secondary | ICD-10-CM

## 2014-12-22 DIAGNOSIS — E1122 Type 2 diabetes mellitus with diabetic chronic kidney disease: Secondary | ICD-10-CM | POA: Diagnosis present

## 2014-12-22 DIAGNOSIS — D631 Anemia in chronic kidney disease: Secondary | ICD-10-CM | POA: Diagnosis present

## 2014-12-22 DIAGNOSIS — D696 Thrombocytopenia, unspecified: Secondary | ICD-10-CM | POA: Diagnosis present

## 2014-12-22 DIAGNOSIS — E785 Hyperlipidemia, unspecified: Secondary | ICD-10-CM | POA: Insufficient documentation

## 2014-12-22 LAB — CBC
HCT: 23.1 % — ABNORMAL LOW (ref 39.0–52.0)
Hemoglobin: 7.4 g/dL — ABNORMAL LOW (ref 13.0–17.0)
MCH: 29.4 pg (ref 26.0–34.0)
MCHC: 32 g/dL (ref 30.0–36.0)
MCV: 91.7 fL (ref 78.0–100.0)
Platelets: 115 10*3/uL — ABNORMAL LOW (ref 150–400)
RBC: 2.52 MIL/uL — ABNORMAL LOW (ref 4.22–5.81)
RDW: 19 % — AB (ref 11.5–15.5)
WBC: 7.7 10*3/uL (ref 4.0–10.5)

## 2014-12-22 LAB — COMPREHENSIVE METABOLIC PANEL
ALT: 12 U/L — ABNORMAL LOW (ref 17–63)
ANION GAP: 11 (ref 5–15)
AST: 28 U/L (ref 15–41)
Albumin: 2.9 g/dL — ABNORMAL LOW (ref 3.5–5.0)
Alkaline Phosphatase: 66 U/L (ref 38–126)
BUN: 11 mg/dL (ref 6–20)
CHLORIDE: 97 mmol/L — AB (ref 101–111)
CO2: 29 mmol/L (ref 22–32)
Calcium: 8.9 mg/dL (ref 8.9–10.3)
Creatinine, Ser: 3.84 mg/dL — ABNORMAL HIGH (ref 0.61–1.24)
GFR calc Af Amer: 17 mL/min — ABNORMAL LOW (ref >60)
GFR, EST NON AFRICAN AMERICAN: 15 mL/min — AB (ref >60)
Glucose, Bld: 102 mg/dL — ABNORMAL HIGH (ref 65–99)
Potassium: 3.3 mmol/L — ABNORMAL LOW (ref 3.5–5.1)
Sodium: 137 mmol/L (ref 135–145)
Total Bilirubin: 1.1 mg/dL (ref 0.3–1.2)
Total Protein: 5.6 g/dL — ABNORMAL LOW (ref 6.5–8.1)

## 2014-12-22 LAB — MRSA PCR SCREENING: MRSA by PCR: NEGATIVE

## 2014-12-22 LAB — PROTIME-INR
INR: 2.39 — ABNORMAL HIGH (ref 0.00–1.49)
Prothrombin Time: 25.8 s — ABNORMAL HIGH (ref 11.6–15.2)

## 2014-12-22 MED ORDER — WARFARIN SODIUM 5 MG PO TABS
5.0000 mg | ORAL_TABLET | Freq: Every day | ORAL | Status: DC
  Administered 2014-12-22: 5 mg via ORAL
  Filled 2014-12-22 (×2): qty 1

## 2014-12-22 MED ORDER — ONDANSETRON HCL 4 MG/2ML IJ SOLN: 4.0000 mg | Freq: Once | INTRAMUSCULAR | Status: DC

## 2014-12-22 MED ORDER — CARVEDILOL 6.25 MG PO TABS
6.2500 mg | ORAL_TABLET | Freq: Every day | ORAL | Status: DC
  Administered 2014-12-22 – 2014-12-30 (×8): 6.25 mg via ORAL
  Filled 2014-12-22 (×9): qty 1

## 2014-12-22 MED ORDER — ACETAMINOPHEN 650 MG RE SUPP: 650.0000 mg | Freq: Four times a day (QID) | RECTAL | Status: DC | PRN

## 2014-12-22 MED ORDER — RENA-VITE PO TABS
1.0000 | ORAL_TABLET | Freq: Every day | ORAL | Status: DC
  Administered 2014-12-22 – 2014-12-30 (×9): 1 via ORAL
  Filled 2014-12-22 (×10): qty 1

## 2014-12-22 MED ORDER — AZATHIOPRINE 50 MG PO TABS
75.0000 mg | ORAL_TABLET | Freq: Every morning | ORAL | Status: DC
  Administered 2014-12-22 – 2014-12-31 (×10): 75 mg via ORAL
  Filled 2014-12-22 (×11): qty 2

## 2014-12-22 MED ORDER — ALUM & MAG HYDROXIDE-SIMETH 200-200-20 MG/5ML PO SUSP: 30.0000 mL | Freq: Four times a day (QID) | ORAL | Status: DC | PRN

## 2014-12-22 MED ORDER — ISOSORBIDE DINITRATE ER 40 MG PO TBCR
40.0000 mg | EXTENDED_RELEASE_TABLET | Freq: Three times a day (TID) | ORAL | Status: DC
  Filled 2014-12-22 (×2): qty 1

## 2014-12-22 MED ORDER — WARFARIN - PHARMACIST DOSING INPATIENT
Freq: Every day | Status: DC
  Administered 2014-12-30: 18:00:00

## 2014-12-22 MED ORDER — ATORVASTATIN CALCIUM 40 MG PO TABS
40.0000 mg | ORAL_TABLET | Freq: Every day | ORAL | Status: DC
  Administered 2014-12-22 – 2014-12-31 (×10): 40 mg via ORAL
  Filled 2014-12-22 (×10): qty 1

## 2014-12-22 MED ORDER — SORBITOL 70 % SOLN
960.0000 mL | TOPICAL_OIL | Freq: Once | ORAL | Status: AC
  Administered 2014-12-22: 960 mL via RECTAL
  Filled 2014-12-22: qty 240

## 2014-12-22 MED ORDER — MILK AND MOLASSES ENEMA
1.0000 | Freq: Once | RECTAL | Status: DC
  Filled 2014-12-22: qty 250

## 2014-12-22 MED ORDER — FAMOTIDINE 20 MG PO TABS
10.0000 mg | ORAL_TABLET | Freq: Every day | ORAL | Status: DC
  Administered 2014-12-22 – 2014-12-30 (×9): 10 mg via ORAL
  Filled 2014-12-22 (×9): qty 1

## 2014-12-22 MED ORDER — INSULIN LISPRO 100 UNIT/ML (KWIKPEN): 4.0000 [IU] | PEN_INJECTOR | Freq: Two times a day (BID) | SUBCUTANEOUS | Status: DC

## 2014-12-22 MED ORDER — COLESEVELAM HCL 625 MG PO TABS
3750.0000 mg | ORAL_TABLET | Freq: Every day | ORAL | Status: DC
  Administered 2014-12-22 – 2014-12-31 (×10): 3750 mg via ORAL
  Filled 2014-12-22 (×11): qty 6

## 2014-12-22 MED ORDER — ACETAMINOPHEN 325 MG PO TABS
650.0000 mg | ORAL_TABLET | Freq: Once | ORAL | Status: AC
  Administered 2014-12-22: 650 mg via ORAL
  Filled 2014-12-22: qty 2

## 2014-12-22 MED ORDER — CALCIUM ACETATE 667 MG PO CAPS
667.0000 mg | ORAL_CAPSULE | Freq: Two times a day (BID) | ORAL | Status: DC
  Administered 2014-12-22 – 2014-12-27 (×10): 667 mg via ORAL
  Filled 2014-12-22 (×14): qty 1

## 2014-12-22 MED ORDER — POLYETHYLENE GLYCOL 3350 17 G PO PACK
17.0000 g | PACK | Freq: Once | ORAL | Status: AC
  Administered 2014-12-22: 17 g via ORAL
  Filled 2014-12-22: qty 1

## 2014-12-22 MED ORDER — SIROLIMUS 0.5 MG PO TABS
3.0000 | ORAL_TABLET | Freq: Every day | ORAL | Status: DC
  Administered 2014-12-25 – 2014-12-26 (×2): 1.5 mg via ORAL
  Filled 2014-12-22: qty 3

## 2014-12-22 MED ORDER — SENNOSIDES-DOCUSATE SODIUM 8.6-50 MG PO TABS
3.0000 | ORAL_TABLET | Freq: Two times a day (BID) | ORAL | Status: DC
  Administered 2014-12-22 – 2014-12-31 (×17): 3 via ORAL
  Filled 2014-12-22 (×19): qty 3

## 2014-12-22 MED ORDER — SORBITOL 70 % SOLN
500.0000 mL | TOPICAL_OIL | ORAL | Status: DC | PRN
  Filled 2014-12-22: qty 125

## 2014-12-22 MED ORDER — POLYETHYLENE GLYCOL 3350 17 GM/SCOOP PO POWD: 1.0000 | Freq: Once | ORAL | Status: DC

## 2014-12-22 MED ORDER — SODIUM CHLORIDE 0.9 % IV BOLUS (SEPSIS)
500.0000 mL | Freq: Once | INTRAVENOUS | Status: AC
  Administered 2014-12-22: 500 mL via INTRAVENOUS

## 2014-12-22 MED ORDER — WARFARIN SODIUM 5 MG PO TABS: 5.0000 mg | ORAL_TABLET | Freq: Every day | ORAL | Status: DC

## 2014-12-22 MED ORDER — ISOSORBIDE DINITRATE ER 40 MG PO CPCR
40.0000 mg | ORAL_CAPSULE | Freq: Three times a day (TID) | ORAL | Status: DC
  Administered 2014-12-22 – 2014-12-31 (×19): 40 mg via ORAL
  Filled 2014-12-22 (×35): qty 1

## 2014-12-22 MED ORDER — PREDNISONE 5 MG PO TABS
5.0000 mg | ORAL_TABLET | Freq: Every day | ORAL | Status: DC
  Administered 2014-12-23 – 2014-12-31 (×9): 5 mg via ORAL
  Filled 2014-12-22 (×10): qty 1

## 2014-12-22 MED ORDER — MORPHINE SULFATE 4 MG/ML IJ SOLN: 4.0000 mg | Freq: Once | INTRAMUSCULAR | Status: DC

## 2014-12-22 MED ORDER — ACETAMINOPHEN 325 MG PO TABS
650.0000 mg | ORAL_TABLET | Freq: Four times a day (QID) | ORAL | Status: DC | PRN
Start: 2014-12-22 — End: 2014-12-31
  Administered 2014-12-23 – 2014-12-24 (×2): 650 mg via ORAL
  Filled 2014-12-22 (×2): qty 2

## 2014-12-22 MED ORDER — IOHEXOL 300 MG/ML  SOLN
100.0000 mL | Freq: Once | INTRAMUSCULAR | Status: AC | PRN
  Administered 2014-12-22: 100 mL via INTRAVENOUS

## 2014-12-22 NOTE — ED Notes (Signed)
Hospitalist at bedside 

## 2014-12-22 NOTE — ED Notes (Signed)
Attempted report 

## 2014-12-22 NOTE — ED Notes (Signed)
Pt unable to tolerate contrast; Jen PA aware; CT to be done without PO contrast

## 2014-12-22 NOTE — ED Notes (Signed)
Pt passed two moderate sized balls of stool. Reports he feels some relief.

## 2014-12-22 NOTE — H&P (Signed)
History and Physical  Brandon Sharp XTG:[id number] DOB: 08-01-1945 DOA: 12/22/2014  Referring physician: Eulas Post, PA-C, ED provider PCP: [name], MD   Chief Complaint: Abdominal pain  HPI: Brandon Sharp is a 69 y.o. male  With a history of diabetes, stage renal disease on hemodialysis, hypertension, peripheral vascular disease, DVT on chronic anticoagulation, congestive heart failure status post heart transplant. He was recently hospitalized earlier this month with end-stage renal disease and symptomatic anemia.  He was in skilled until the 20th of this month, where he was discharged to home. He reports one week of constipation with no stooling. He has been having flatulence. He presents with 24 hours of worsening abdominal pain, particularly in the left lower quadrant with no radiation. Pain is described as severe cramping. Partially relieved with flatulence. No other provoking or palliating factors. Manual disimpaction was attempted in the emergency department, which the patient did not tolerate. Additionally, a smog enema was attempted, however the patient did not tolerate this. While in emergency department the patient has had 3 very small stools that are very hard in consistency   Review of Systems:    Pt denies any fevers, chills, nausea, vomiting, diarrhea, chest pain, shortness of breath, palpitations, headaches, blurred vision, rectal bleeding, melena.  Review of systems are otherwise negative  Past Medical History  Diagnosis Date  . Diabetes mellitus   . Hypertension   . Myocardial infarction 1985; 1990  . Angina   . Dysrhythmia   . DVT (deep venous thrombosis) ~ 12/2010    LLE  . Anemia   . Blood transfusion 06/1999    post heart transplant  . Peripheral vascular disease   . Renal failure     Hemodialysis MWF last 2 years, sse dr [name] nephrology, goes to [place] [facility]  . DVT (deep venous thrombosis) 08/25/2014    Nearly occluded R  IJ, and L subclavian DVT  . CHF (congestive heart failure) 2001    beffore transplant; had AICD that was explanted 07/19/99 following successful heart transplant  . Coronary artery disease     transplant cardiologist Dr. Corky Mull [facility])  . Pneumonia    Past Surgical History  Procedure Laterality Date  . Nephrectomy transplanted organ  2008  . Av fistula repair      rt. a=fore arm fistula  . Av fistula placement  ~ 01/2010    "this was my 2nd fistula placement"  . Heart transplant  06/1999  . Colonoscopy  03/17/2012    Procedure: COLONOSCOPY;  Surgeon: Lear Ng, MD;  [facility];  Service: Endoscopy;  Laterality: N/A;  . Coronary artery bypass graft  1990    CABG X3  . Colonoscopy with propofol N/A 12/14/2013    Procedure: COLONOSCOPY WITH PROPOFOL;  Surgeon: Lear Ng, MD;  [facility];  Service: Endoscopy;  Laterality: N/A;  . Video bronchoscopy Bilateral 04/14/2014    Procedure: VIDEO BRONCHOSCOPY WITH FLUORO;  Surgeon: Collene Gobble, MD;  Location: [place];  Service: Cardiopulmonary;  Laterality: Bilateral;  . Shuntogram Right 05/09/2014    Procedure: FISTULOGRAM;  Surgeon: Conrad La Paloma, MD;  Location: [facility] CATH LAB;  Service: Cardiovascular;  Laterality: Right;  . Coronary angioplasty with stent placement  1985; 1990    prior stents in the 80's and 90's then CABG; s/p heart transplant 2001 s/p BMS mid LAD and proximal RCA 04/21/13 [facility])  . Av fistula placement Right 10/20/2014    Procedure: INSERTION OF ARTERIOVENOUS (AV)  GORE-TEX GRAFT THIGH;  Surgeon: Conrad Senatobia, MD;  Location: [place];  Service: Vascular;  Laterality: Right;  . Hematoma evacuation Right 11/21/2014    Procedure: EVACUATION HEMATOMA Right Groin;  Surgeon: Conrad Myers Corner, MD;  Location: [place];  Service: Vascular;  Laterality: Right;  . Application of wound vac Right 11/21/2014    Procedure: APPLICATION OF WOUND VAC Right Groin;  Surgeon: Conrad [place] Beach, MD;  Location: [place];  Service: Vascular;  Laterality: Right;  . Peripheral vascular catheterization N/A 11/25/2014    Procedure: Abdominal Aortogram;  Surgeon: Elam Dutch, MD;  Location: [place] CV LAB;  Service: Cardiovascular;  Laterality: N/A;  . Lower extremity angiogram Right 11/25/2014    Procedure: Lower Extremity Angiogram;  Surgeon: Elam Dutch, MD;  Location: [place] CV LAB;  Service: Cardiovascular;  Laterality: Right;   Social History:  reports that he quit smoking about 26 years ago. His smoking use included Cigarettes. He has a 20 pack-year smoking history. He has never used smokeless tobacco. He reports that he drinks alcohol. He reports that he does not use illicit drugs. Patient lives at home & is able to participate in activities of daily living  Allergies  Allergen Reactions  . Lisinopril Swelling    Lips and tongue swell  . Niacin And Related Other (See Comments)    unknown  . Norvasc [Amlodipine Besylate] Rash    Flushing  . Penicillins Rash    Family History  Problem Relation Age of Onset  . Heart disease Mother   . Hyperlipidemia Mother   . Hypertension Mother   . Diabetes Mother   . Hyperlipidemia Father   . Hypertension Father   . Heart disease Father   . Diabetes Brother   . Heart disease Brother     Heart Disease before age 56  . Hyperlipidemia Brother   . Hypertension Brother   . Heart attack Brother      Prior to Admission medications   Medication Sig Start Date End Date Taking? Authorizing Provider  atorvastatin (LIPITOR) 40 MG tablet Take 40 mg by mouth daily.     Yes Historical Provider, MD  azaTHIOprine (IMURAN) 50 MG tablet Take 75 mg by mouth every morning.    Yes Historical Provider, MD  b complex-vitamin c-folic acid (NEPHRO-VITE) 0.8 MG TABS tablet Take 1 tablet by mouth at bedtime.   Yes Historical Provider, MD  calcium acetate (PHOSLO) 667 MG capsule Take 1 capsule (667 mg total) by mouth 2 (two) times daily with a meal. 12/14/14  Yes  Ivan Anchors Love, PA-C  carvedilol (COREG) 6.25 MG tablet Take 1 tablet (6.25 mg total) by mouth at bedtime. 04/15/14  Yes Geradine Girt, DO  colesevelam (WELCHOL) 625 MG tablet Take 3,750 mg by mouth daily. Takes 6 tabs daily   Yes Historical Provider, MD  famotidine (PEPCID) 10 MG tablet Take 1 tablet (10 mg total) by mouth at bedtime. 12/14/14  Yes Ivan Anchors Love, PA-C  HUMALOG KWIKPEN 100 UNIT/ML SOPN Inject 4-10 Units into the skin 2 (two) times daily. Per sliding scale 11/17/12  Yes Historical Provider, MD  isosorbide dinitrate (ISOCHRON) 40 MG CR tablet Take 40 mg by mouth every 8 (eight) hours. 10/03/14  Yes Historical Provider, MD  oxyCODONE (OXY IR/ROXICODONE) 5 MG immediate release tablet Take 1 tablet (5 mg total) by mouth every 6 (six) hours as needed for moderate pain. 12/14/14  Yes Ivan Anchors Love, PA-C  predniSONE (DELTASONE) 5 MG tablet  Take 1 tablet (5 mg total) by mouth daily with breakfast. 08/29/14  Yes Orson Eva, MD  senna-docusate (SENOKOT-S) 8.6-50 MG per tablet Take 3 tablets by mouth 2 (two) times daily. 12/14/14  Yes Ivan Anchors Love, PA-C  Sirolimus 0.5 MG TABS Take 3 tablets by mouth daily. 10/10/14  Yes Historical Provider, MD  warfarin (COUMADIN) 5 MG tablet Please consult: Inpatient pharmacy to continue dosing warfarin and monitor Patient taking differently: Take 5 mg by mouth daily. Please consult: Inpatient pharmacy to continue dosing warfarin and monitor 12/02/14  Yes Albertine Patricia, MD  B-D INS SYR HALF-UNIT .3CC/31G 31G X 5/16" 0.3 ML MISC USE AS DIRECTED UP TO 2-4 TIMES A DAY 11/02/14   Historical Provider, MD  pantoprazole (PROTONIX) 40 MG tablet Take 1 tablet (40 mg total) by mouth daily. Patient not taking: Reported on 12/22/2014 12/02/14   Albertine Patricia, MD    Physical Exam: BP 113/49 mmHg  Pulse 114  Temp(Src) 102.2 F (39 C) (Oral)  Resp 18  SpO2 95%  General: Elderly black male. Awake and alert and oriented x3. No acute cardiopulmonary distress.  Eyes: Pupils  equal, round, reactive to light. Extraocular muscles are intact. Sclerae anicteric and noninjected.  ENT:  Moist mucosal membranes. No mucosal lesions.   Neck: Neck supple without lymphadenopathy. No carotid bruits. No masses palpated.  Cardiovascular: Regular rate with normal S1-S2 sounds. No murmurs, rubs, gallops auscultated. No JVD.  Respiratory: Good respiratory effort with no wheezes, rales, rhonchi. Lungs clear to auscultation bilaterally.  Abdomen: Soft, tender in the left lower quadrant, nondistended. No rebound tenderness or guarding. Active bowel sounds. No masses or hepatosplenomegaly  Skin: Dry, warm to touch. 2+ dorsalis pedis and radial pulses. Musculoskeletal: No calf or leg pain. All major joints not erythematous nontender.  Psychiatric: Intact judgment and insight.  Neurologic: No focal neurological deficits. Cranial nerves II through XII are grossly intact.           Labs on Admission:  Basic Metabolic Panel:  Recent Labs Lab 12/22/14 0432  NA 137  K 3.3*  CL 97*  CO2 29  GLUCOSE 102*  BUN 11  CREATININE 3.84*  CALCIUM 8.9   Liver Function Tests:  Recent Labs Lab 12/22/14 0432  AST 28  ALT 12*  ALKPHOS 66  BILITOT 1.1  PROT 5.6*  ALBUMIN 2.9*   No results for input(s): LIPASE, AMYLASE in the last 168 hours. No results for input(s): AMMONIA in the last 168 hours. CBC:  Recent Labs Lab 12/22/14 0432  WBC 7.7  HGB 7.4*  HCT 23.1*  MCV 91.7  PLT 115*   Cardiac Enzymes: No results for input(s): CKTOTAL, CKMB, CKMBINDEX, TROPONINI in the last 168 hours.  BNP (last 3 results) No results for input(s): BNP in the last 8760 hours.  ProBNP (last 3 results)  Recent Labs  03/27/14 1747 04/12/14 0105  PROBNP 37853.0* 45045.0*    CBG: No results for input(s): GLUCAP in the last 168 hours.  Radiological Exams on Admission: Ct Abdomen Pelvis W Contrast  12/22/2014   CLINICAL DATA:  Abdominal pain and constipation. Post right groin hematoma  evacuation 1 month prior.  EXAM: CT ABDOMEN AND PELVIS WITH CONTRAST  TECHNIQUE: Multidetector CT imaging of the abdomen and pelvis was performed using the standard protocol following bolus administration of intravenous contrast.  CONTRAST:  169mL OMNIPAQUE IOHEXOL 300 MG/ML  SOLN  COMPARISON:  CT 05/06/2014  FINDINGS: Lower chest: Cardiomegaly. Mild subpleural reticulation consistent chronic lung disease in  the lingula and right middle lobe.  Hepatobiliary: Scattered small hypodensities, likely cysts or hemangiomas, with the largest cyst measuring 2.1 cm in the right lobe. Gallbladder is physiologically distended. No biliary dilatation.  Pancreas: Mildly atrophic without ductal dilatation or focal abnormality.  Spleen: Normal.  Adrenals/Urinary Tract: Bilateral renal parenchymal atrophy. Renal parenchyma near completely replaced with hypodense lesions of varying sizes. Some of the cyst superior hyperdense. No hydronephrosis. Partially calcified cysts the right renal hilum, unchanged in size from prior. The adrenal glands are normal. And transplant kidney in the right iliac fossa is again seen and not significantly changed in appearance. No transplant hydronephrosis.  Stomach/Bowel: Stomach is physiologically distended. There are no dilated or thickened bowel loops. The appendix is normal. Small to moderate volume of stool in the proximal colon. Increased stool burden distally with stool ball distends the rectum. Distal colonic diverticulosis without diverticulitis.  Vascular/Lymphatic: Dense atherosclerosis of the abdominal aorta and its branches. No aneurysm. No retroperitoneal adenopathy. Right groin fluid collection insinuating about the right femoral vessels and presumed graft material. Fluid collection measures approximately 5.1 cm in greatest dimension. There is no peripheral enhancement or thick wall. Overlying subcutaneous defect, with skin appearing intact.  Reproductive: Prostate gland is trauma in size.   Urinary bladder:  Near completely decompressed.  Other:  No ascites.  No intra-abdominal fluid collection.  Musculoskeletal: There are no acute or suspicious osseous abnormalities. Scattered degenerative change in the lumbar spine. Advanced degenerative change of the left hip.  IMPRESSION: 1. Increased stool burden distally with stool ball distending the rectum, supports clinical diagnosis of constipation. No bowel obstruction. 2. Elongated fluid density structure in the right groin, likely sequela of prior hematoma. No CT findings of infection. 3. Additional stable chronic findings include diverticulosis without diverticulitis, atrophy and cystic replacement of the native kidneys with right lower quadrant renal transplant, hepatic cysts, and advanced atherosclerosis.   Electronically Signed   By: Jeb Levering M.D.   On: 12/22/2014 06:41    Assessment/Plan Present on Admission:  . Constipation . Fecal impaction  This patient was discussed with the ED physician, including pertinent vitals, physical exam findings, labs, and imaging.  We also discussed care given by the ED provider.  #1 fecal impaction #2 constipation  Admit for observation  Small enema - every 4 hours while awake  mira-lax Bowel prep  Clear liquid diet  Continue home medications  DVT prophylaxis: On warfarin  Consultants: None  Code Status: DO NOT RESUSCITATE  Family Communication: None   Disposition Plan: Home following observation and cleanout   Truett Mainland, DO [facility] Pager [phone]

## 2014-12-22 NOTE — ED Provider Notes (Signed)
Patient care signed out to me by Rodolph Bong, PA-C. Patient with history of end-stage renal disease, dialysis on Monday Wednesday Friday, presents for evaluation of abdominal discomfort with associated nausea and vomiting. Abdominal pain 1 day, constipation 1 week. Plan is to follow-up on CT abdomen results as well as recheck PO status. 7:30am--patient states he feels much better. He is tolerating PO fluids. CT abdomen shows large stool ball in distal rectum. Patient given a smog enema in the ED.  9:00am--Per nursing, smog enema was unsuccessful despite multiple attempts. Digital disimpaction also unsuccessful. Patient would benefit inpatient admission and subsequent enemas and possible bowel prep to loosen stool burden. Discussed with hospitalist, Dr. Nehemiah Settle, requests consult to GI. Spoke with GI, Dr. Glyn Ade, recommends repeat enemas as well as MiraLAX treatment. Advises admission to medical service. Will be available for consult if necessary Meds given in ED:  Medications  morphine 4 MG/ML injection 4 mg (not administered)  ondansetron (ZOFRAN) injection 4 mg (not administered)  sorbitol, milk of mag, mineral oil, glycerin (SMOG) enema (not administered)  sodium chloride 0.9 % bolus 500 mL (500 mLs Intravenous New Bag/Given 12/22/14 0504)  iohexol (OMNIPAQUE) 300 MG/ML solution 100 mL (100 mLs Intravenous Contrast Given 12/22/14 0600)    New Prescriptions   No medications on file   Filed Vitals:   12/22/14 0648 12/22/14 0700 12/22/14 0715 12/22/14 0731  BP: 123/52 131/50 144/52 136/54  Pulse: 103 103 104 109  Temp:      TempSrc:      Resp: 17   16  SpO2: 97% 96% 97% 96%     Comer Locket, PA-C 12/22/14 0947  Comer Locket, PA-C 12/22/14 [place], MD 12/27/14 [phone]

## 2014-12-22 NOTE — ED Notes (Signed)
Pt c/o abdominal pain and constipation since Saturday. Wife reports giving pt ducolax and sennokot without relief. Pt appears weak and uncomfortable

## 2014-12-22 NOTE — Progress Notes (Signed)
[facility]  Patient Status: Active (receiving services up to time of hospitalization)  [facility] is providing the following services: PT and OT  If patient discharges after hours, please call [phone].   Edwinna Areola 12/22/2014, 3:41 PM

## 2014-12-22 NOTE — ED Provider Notes (Signed)
CSN: [id number]     Arrival date & time 12/22/14  0405 History   First MD Initiated Contact with Patient 12/22/14 0444     Chief Complaint  Patient presents with  . Abdominal Pain     (Consider location/radiation/quality/duration/timing/severity/associated sxs/prior Treatment) HPI Comments: Patient is a 69 year old male past medical history significant for ESRD on dialysis Monday Wednesday Friday, CHF, anemia, hypertension, diabetes mellitus presenting to the emergency department for one day of acute onset lower abdominal pain with associated one week of constipation. Patient has been able to pass gas without difficulty. He denies any fevers, chest pain, shortness of breath, nausea or vomiting. Patient went to dialysis yesterday.   Past Medical History  Diagnosis Date  . Diabetes mellitus   . Hypertension   . Myocardial infarction 1985; 1990  . Angina   . Dysrhythmia   . DVT (deep venous thrombosis) ~ 12/2010    LLE  . Anemia   . Blood transfusion 06/1999    post heart transplant  . Peripheral vascular disease   . Renal failure     Hemodialysis MWF last 2 years, sse dr [name] nephrology, goes to [place] [facility]  . DVT (deep venous thrombosis) 08/25/2014    Nearly occluded R IJ, and L subclavian DVT  . CHF (congestive heart failure) 2001    beffore transplant; had AICD that was explanted 07/19/99 following successful heart transplant  . Coronary artery disease     transplant cardiologist Dr. Corky Mull [facility])  . Pneumonia    Past Surgical History  Procedure Laterality Date  . Nephrectomy transplanted organ  2008  . Av fistula repair      rt. a=fore arm fistula  . Av fistula placement  ~ 01/2010    "this was my 2nd fistula placement"  . Heart transplant  06/1999  . Colonoscopy  03/17/2012    Procedure: COLONOSCOPY;  Surgeon: Lear Ng, MD;  [facility];  Service: Endoscopy;  Laterality: N/A;  . Coronary artery bypass graft  1990    CABG  X3  . Colonoscopy with propofol N/A 12/14/2013    Procedure: COLONOSCOPY WITH PROPOFOL;  Surgeon: Lear Ng, MD;  [facility];  Service: Endoscopy;  Laterality: N/A;  . Video bronchoscopy Bilateral 04/14/2014    Procedure: VIDEO BRONCHOSCOPY WITH FLUORO;  Surgeon: Collene Gobble, MD;  Location: [place];  Service: Cardiopulmonary;  Laterality: Bilateral;  . Shuntogram Right 05/09/2014    Procedure: FISTULOGRAM;  Surgeon: Conrad Hurst, MD;  Location: [facility] CATH LAB;  Service: Cardiovascular;  Laterality: Right;  . Coronary angioplasty with stent placement  1985; 1990    prior stents in the 80's and 90's then CABG; s/p heart transplant 2001 s/p BMS mid LAD and proximal RCA 04/21/13 [facility])  . Av fistula placement Right 10/20/2014    Procedure: INSERTION OF ARTERIOVENOUS (AV) GORE-TEX GRAFT THIGH;  Surgeon: Conrad Bel-Ridge, MD;  Location: [place];  Service: Vascular;  Laterality: Right;  . Hematoma evacuation Right 11/21/2014    Procedure: EVACUATION HEMATOMA Right Groin;  Surgeon: Conrad , MD;  Location: [place];  Service: Vascular;  Laterality: Right;  . Application of wound vac Right 11/21/2014    Procedure: APPLICATION OF WOUND VAC Right Groin;  Surgeon: Conrad , MD;  Location: [place];  Service: Vascular;  Laterality: Right;  . Peripheral vascular catheterization N/A 11/25/2014    Procedure: Abdominal Aortogram;  Surgeon: Elam Dutch, MD;  Location: [place] CV LAB;  Service: Cardiovascular;  Laterality: N/A;  . Lower extremity angiogram Right 11/25/2014    Procedure: Lower Extremity Angiogram;  Surgeon: Elam Dutch, MD;  Location: [place] CV LAB;  Service: Cardiovascular;  Laterality: Right;   Family History  Problem Relation Age of Onset  . Heart disease Mother   . Hyperlipidemia Mother   . Hypertension Mother   . Diabetes Mother   . Hyperlipidemia Father   . Hypertension Father   . Heart disease Father   . Diabetes Brother   . Heart disease Brother      Heart Disease before age 49  . Hyperlipidemia Brother   . Hypertension Brother   . Heart attack Brother    History  Substance Use Topics  . Smoking status: Former Smoker -- 1.00 packs/day for 20 years    Types: Cigarettes    Quit date: 05/27/1988  . Smokeless tobacco: Never Used  . Alcohol Use: Yes     Comment: "Holidays"    Review of Systems  Respiratory: Negative for shortness of breath.   Cardiovascular: Negative for chest pain.  Gastrointestinal: Positive for abdominal pain and constipation. Negative for nausea, vomiting and diarrhea.  All other systems reviewed and are negative.     Allergies  Lisinopril; Niacin and related; Norvasc; and Penicillins  Home Medications   Prior to Admission medications   Medication Sig Start Date End Date Taking? Authorizing Provider  atorvastatin (LIPITOR) 40 MG tablet Take 40 mg by mouth daily.      Historical Provider, MD  azaTHIOprine (IMURAN) 50 MG tablet Take 75 mg by mouth every morning.     Historical Provider, MD  b complex-vitamin c-folic acid (NEPHRO-VITE) 0.8 MG TABS tablet Take 1 tablet by mouth at bedtime.    Historical Provider, MD  B-D INS SYR HALF-UNIT .3CC/31G 31G X 5/16" 0.3 ML MISC USE AS DIRECTED UP TO 2-4 TIMES A DAY 11/02/14   Historical Provider, MD  calcium acetate (PHOSLO) 667 MG capsule Take 1 capsule (667 mg total) by mouth 2 (two) times daily with a meal. 12/14/14   Ivan Anchors Love, PA-C  carvedilol (COREG) 6.25 MG tablet Take 1 tablet (6.25 mg total) by mouth at bedtime. 04/15/14   Geradine Girt, DO  colesevelam (WELCHOL) 625 MG tablet Take 3,750 mg by mouth daily. Takes 6 tabs daily    Historical Provider, MD  famotidine (PEPCID) 10 MG tablet Take 1 tablet (10 mg total) by mouth at bedtime. 12/14/14   Ivan Anchors Love, PA-C  HUMALOG KWIKPEN 100 UNIT/ML SOPN Inject 4-10 Units into the skin 2 (two) times daily. Per sliding scale 11/17/12   Historical Provider, MD  isosorbide dinitrate (ISOCHRON) 40 MG CR tablet Take  40 mg by mouth every 8 (eight) hours. 10/03/14   Historical Provider, MD  oxyCODONE (OXY IR/ROXICODONE) 5 MG immediate release tablet Take 1 tablet (5 mg total) by mouth every 6 (six) hours as needed for moderate pain. 12/14/14   Bary Leriche, PA-C  pantoprazole (PROTONIX) 40 MG tablet Take 1 tablet (40 mg total) by mouth daily. 12/02/14   Silver Huguenin Elgergawy, MD  predniSONE (DELTASONE) 5 MG tablet Take 1 tablet (5 mg total) by mouth daily with breakfast. 08/29/14   Orson Eva, MD  senna-docusate (SENOKOT-S) 8.6-50 MG per tablet Take 3 tablets by mouth 2 (two) times daily. 12/14/14   Bary Leriche, PA-C  Sirolimus 0.5 MG TABS Take 3 tablets by mouth daily. 10/10/14   Historical Provider, MD  warfarin (COUMADIN) 5  MG tablet Please consult: Inpatient pharmacy to continue dosing warfarin and monitor 12/02/14   Silver Huguenin Elgergawy, MD   BP 130/58 mmHg  Pulse 106  Temp(Src) 98.7 F (37.1 C) (Oral)  Resp 18  SpO2 100% Physical Exam  Constitutional: He is oriented to person, place, and time. He appears well-developed and well-nourished.  HENT:  Head: Normocephalic and atraumatic.  Eyes: Conjunctivae are normal.  Neck: Neck supple.  Cardiovascular: Normal heart sounds.   Abdominal: Soft. Bowel sounds are normal. He exhibits no distension. There is tenderness. There is no rebound and no guarding.  Neurological: He is alert and oriented to person, place, and time.  Skin: Skin is warm and dry.  Nursing note and vitals reviewed.   ED Course  Procedures (including critical care time) Medications  morphine 4 MG/ML injection 4 mg (not administered)  ondansetron (ZOFRAN) injection 4 mg (not administered)  sodium chloride 0.9 % bolus 500 mL (500 mLs Intravenous New Bag/Given 12/22/14 0504)  iohexol (OMNIPAQUE) 300 MG/ML solution 100 mL (100 mLs Intravenous Contrast Given 12/22/14 0600)    Labs Review Labs Reviewed  COMPREHENSIVE METABOLIC PANEL - Abnormal; Notable for the following:    Potassium 3.3 (*)     Chloride 97 (*)    Glucose, Bld 102 (*)    Creatinine, Ser 3.84 (*)    Total Protein 5.6 (*)    Albumin 2.9 (*)    ALT 12 (*)    GFR calc non Af Amer 15 (*)    GFR calc Af Amer 17 (*)    All other components within normal limits  CBC - Abnormal; Notable for the following:    RBC 2.52 (*)    Hemoglobin 7.4 (*)    HCT 23.1 (*)    RDW 19.0 (*)    Platelets 115 (*)    All other components within normal limits    Imaging Review No results found.   EKG Interpretation None      MDM   Final diagnoses:  Abdominal pain in male    Filed Vitals:   12/22/14 0545  BP: 130/58  Pulse: 106  Temp:   Resp:    CT scan pending at shift change will sign out to Comer Locket, PA-C pending results. Patient d/w with Dr. Sharol Given, agrees with plan.      Baron Sane, PA-C 12/22/14 [place], MD 12/22/14 [phone]

## 2014-12-22 NOTE — ED Notes (Signed)
Pt declining pain medications at this time.

## 2014-12-22 NOTE — ED Notes (Signed)
Pt to ED via [facility] c/o abdominal pain. Last BM on Saturday. Pt is MWF dialysis patient, dialysis access to L chest. Pt assisted to restroom on arrival. Unable to have a BM. Weakness noted when ambulating. Initial blood pressure 90/50

## 2014-12-22 NOTE — Progress Notes (Signed)
ANTICOAGULATION CONSULT NOTE - Initial Consult  Pharmacy Consult for Coumadin Indication: Hx of DVT  Allergies  Allergen Reactions  . Lisinopril Swelling    Lips and tongue swell  . Niacin And Related Other (See Comments)    unknown  . Norvasc [Amlodipine Besylate] Rash    Flushing  . Penicillins Rash    Vital Signs: Temp: 102.2 F (39 C) (07/28 1000) Temp Source: Oral (07/28 0416) BP: 113/49 mmHg (07/28 1130) Pulse Rate: 114 (07/28 1130)  Labs:  Recent Labs  12/22/14 0432  HGB 7.4*  HCT 23.1*  PLT 115*  CREATININE 3.84*    Estimated Creatinine Clearance: 16.7 mL/min (by C-G formula based on Cr of 3.84).   Medical History: Past Medical History  Diagnosis Date  . Diabetes mellitus   . Hypertension   . Myocardial infarction 1985; 1990  . Angina   . Dysrhythmia   . DVT (deep venous thrombosis) ~ 12/2010    LLE  . Anemia   . Blood transfusion 06/1999    post heart transplant  . Peripheral vascular disease   . Renal failure     Hemodialysis MWF last 2 years, sse dr [name] nephrology, goes to [facility]  . DVT (deep venous thrombosis) 08/25/2014    Nearly occluded R IJ, and L subclavian DVT  . CHF (congestive heart failure) 2001    beffore transplant; had AICD that was explanted 07/19/99 following successful heart transplant  . Coronary artery disease     transplant cardiologist Dr. Corky Mull [facility])  . Pneumonia     Assessment: 69 yo M presents on 7/28 with abdominal pain. PMH includes DM, ESRD, HTN, hx of DVT, CHF s/p heart transplant. Pharmacy to dose coumadin. Hgb low at 7.4, plts 115. No s/s of bleed. INR on admit was therapeutic at 2.39   PTA Coumadin 5mg  daily. (Last dose was 7/27)  Goal of Therapy:  INR 2-3 Monitor platelets by anticoagulation protocol: Yes   Plan:  Give coumadin 5mg  PO x 1 today Monitor daily INR, CBC, s/s of bleed  Alaya Iverson J 12/22/2014,1:44 PM

## 2014-12-23 ENCOUNTER — Observation Stay (HOSPITAL_COMMUNITY): Payer: Medicare Other

## 2014-12-23 DIAGNOSIS — I959 Hypotension, unspecified: Secondary | ICD-10-CM | POA: Diagnosis not present

## 2014-12-23 DIAGNOSIS — R109 Unspecified abdominal pain: Secondary | ICD-10-CM | POA: Diagnosis present

## 2014-12-23 DIAGNOSIS — Z941 Heart transplant status: Secondary | ICD-10-CM | POA: Diagnosis not present

## 2014-12-23 DIAGNOSIS — E1165 Type 2 diabetes mellitus with hyperglycemia: Secondary | ICD-10-CM | POA: Diagnosis present

## 2014-12-23 DIAGNOSIS — Z794 Long term (current) use of insulin: Secondary | ICD-10-CM | POA: Diagnosis not present

## 2014-12-23 DIAGNOSIS — D696 Thrombocytopenia, unspecified: Secondary | ICD-10-CM | POA: Diagnosis present

## 2014-12-23 DIAGNOSIS — Z955 Presence of coronary angioplasty implant and graft: Secondary | ICD-10-CM | POA: Diagnosis not present

## 2014-12-23 DIAGNOSIS — D631 Anemia in chronic kidney disease: Secondary | ICD-10-CM | POA: Diagnosis present

## 2014-12-23 DIAGNOSIS — A4902 Methicillin resistant Staphylococcus aureus infection, unspecified site: Secondary | ICD-10-CM | POA: Diagnosis not present

## 2014-12-23 DIAGNOSIS — Z833 Family history of diabetes mellitus: Secondary | ICD-10-CM | POA: Diagnosis not present

## 2014-12-23 DIAGNOSIS — Z992 Dependence on renal dialysis: Secondary | ICD-10-CM | POA: Diagnosis not present

## 2014-12-23 DIAGNOSIS — N2581 Secondary hyperparathyroidism of renal origin: Secondary | ICD-10-CM | POA: Diagnosis present

## 2014-12-23 DIAGNOSIS — K625 Hemorrhage of anus and rectum: Secondary | ICD-10-CM | POA: Diagnosis not present

## 2014-12-23 DIAGNOSIS — D72819 Decreased white blood cell count, unspecified: Secondary | ICD-10-CM | POA: Diagnosis not present

## 2014-12-23 DIAGNOSIS — Z951 Presence of aortocoronary bypass graft: Secondary | ICD-10-CM | POA: Diagnosis not present

## 2014-12-23 DIAGNOSIS — I252 Old myocardial infarction: Secondary | ICD-10-CM | POA: Diagnosis not present

## 2014-12-23 DIAGNOSIS — K5900 Constipation, unspecified: Secondary | ICD-10-CM | POA: Diagnosis not present

## 2014-12-23 DIAGNOSIS — Z66 Do not resuscitate: Secondary | ICD-10-CM | POA: Diagnosis present

## 2014-12-23 DIAGNOSIS — K626 Ulcer of anus and rectum: Secondary | ICD-10-CM | POA: Diagnosis present

## 2014-12-23 DIAGNOSIS — T814XXA Infection following a procedure, initial encounter: Secondary | ICD-10-CM | POA: Diagnosis present

## 2014-12-23 DIAGNOSIS — Y838 Other surgical procedures as the cause of abnormal reaction of the patient, or of later complication, without mention of misadventure at the time of the procedure: Secondary | ICD-10-CM | POA: Diagnosis present

## 2014-12-23 DIAGNOSIS — N185 Chronic kidney disease, stage 5: Secondary | ICD-10-CM | POA: Diagnosis not present

## 2014-12-23 DIAGNOSIS — I739 Peripheral vascular disease, unspecified: Secondary | ICD-10-CM | POA: Diagnosis present

## 2014-12-23 DIAGNOSIS — A4102 Sepsis due to Methicillin resistant Staphylococcus aureus: Secondary | ICD-10-CM | POA: Diagnosis not present

## 2014-12-23 DIAGNOSIS — Z7952 Long term (current) use of systemic steroids: Secondary | ICD-10-CM | POA: Diagnosis not present

## 2014-12-23 DIAGNOSIS — Z87891 Personal history of nicotine dependence: Secondary | ICD-10-CM | POA: Diagnosis not present

## 2014-12-23 DIAGNOSIS — A419 Sepsis, unspecified organism: Secondary | ICD-10-CM | POA: Diagnosis not present

## 2014-12-23 DIAGNOSIS — Z8249 Family history of ischemic heart disease and other diseases of the circulatory system: Secondary | ICD-10-CM | POA: Diagnosis not present

## 2014-12-23 DIAGNOSIS — I120 Hypertensive chronic kidney disease with stage 5 chronic kidney disease or end stage renal disease: Secondary | ICD-10-CM | POA: Diagnosis present

## 2014-12-23 DIAGNOSIS — R791 Abnormal coagulation profile: Secondary | ICD-10-CM | POA: Diagnosis not present

## 2014-12-23 DIAGNOSIS — K5641 Fecal impaction: Secondary | ICD-10-CM | POA: Diagnosis present

## 2014-12-23 DIAGNOSIS — Z7901 Long term (current) use of anticoagulants: Secondary | ICD-10-CM | POA: Diagnosis not present

## 2014-12-23 DIAGNOSIS — Z86718 Personal history of other venous thrombosis and embolism: Secondary | ICD-10-CM | POA: Diagnosis not present

## 2014-12-23 DIAGNOSIS — N186 End stage renal disease: Secondary | ICD-10-CM | POA: Diagnosis not present

## 2014-12-23 DIAGNOSIS — I82409 Acute embolism and thrombosis of unspecified deep veins of unspecified lower extremity: Secondary | ICD-10-CM | POA: Diagnosis not present

## 2014-12-23 DIAGNOSIS — Z79899 Other long term (current) drug therapy: Secondary | ICD-10-CM | POA: Diagnosis not present

## 2014-12-23 DIAGNOSIS — E1122 Type 2 diabetes mellitus with diabetic chronic kidney disease: Secondary | ICD-10-CM | POA: Diagnosis present

## 2014-12-23 DIAGNOSIS — M898X9 Other specified disorders of bone, unspecified site: Secondary | ICD-10-CM | POA: Diagnosis present

## 2014-12-23 LAB — CBC
HEMATOCRIT: 23.9 % — AB (ref 39.0–52.0)
HEMOGLOBIN: 7.6 g/dL — AB (ref 13.0–17.0)
MCH: 29 pg (ref 26.0–34.0)
MCHC: 31.8 g/dL (ref 30.0–36.0)
MCV: 91.2 fL (ref 78.0–100.0)
PLATELETS: 76 10*3/uL — AB (ref 150–400)
RBC: 2.62 MIL/uL — ABNORMAL LOW (ref 4.22–5.81)
RDW: 19.4 % — ABNORMAL HIGH (ref 11.5–15.5)
WBC: 8 10*3/uL (ref 4.0–10.5)

## 2014-12-23 LAB — BASIC METABOLIC PANEL
Anion gap: 12 (ref 5–15)
BUN: 24 mg/dL — AB (ref 6–20)
CO2: 23 mmol/L (ref 22–32)
CREATININE: 6.24 mg/dL — AB (ref 0.61–1.24)
Calcium: 8.7 mg/dL — ABNORMAL LOW (ref 8.9–10.3)
Chloride: 101 mmol/L (ref 101–111)
GFR calc Af Amer: 9 mL/min — ABNORMAL LOW (ref >60)
GFR calc non Af Amer: 8 mL/min — ABNORMAL LOW (ref >60)
GLUCOSE: 76 mg/dL (ref 65–99)
Potassium: 4.4 mmol/L (ref 3.5–5.1)
Sodium: 136 mmol/L (ref 135–145)

## 2014-12-23 LAB — PROCALCITONIN: PROCALCITONIN: 49.2 ng/mL

## 2014-12-23 LAB — LACTIC ACID, PLASMA
LACTIC ACID, VENOUS: 2.5 mmol/L — AB (ref 0.5–2.0)
LACTIC ACID, VENOUS: 1.4 mmol/L (ref 0.5–2.0)

## 2014-12-23 LAB — PROTIME-INR
INR: 2.7 — AB (ref 0.00–1.49)
Prothrombin Time: 28.2 seconds — ABNORMAL HIGH (ref 11.6–15.2)

## 2014-12-23 LAB — APTT: APTT: 39 s — AB (ref 24–37)

## 2014-12-23 MED ORDER — LIDOCAINE-PRILOCAINE 2.5-2.5 % EX CREA
1.0000 "application " | TOPICAL_CREAM | CUTANEOUS | Status: DC | PRN
  Filled 2014-12-23: qty 5

## 2014-12-23 MED ORDER — SODIUM CHLORIDE 0.9 % IV SOLN
500.0000 mg | Freq: Once | INTRAVENOUS | Status: AC
  Administered 2014-12-23: 500 mg via INTRAVENOUS
  Filled 2014-12-23: qty 500

## 2014-12-23 MED ORDER — LEVOFLOXACIN IN D5W 750 MG/150ML IV SOLN
750.0000 mg | Freq: Once | INTRAVENOUS | Status: AC
  Administered 2014-12-23: 750 mg via INTRAVENOUS
  Filled 2014-12-23: qty 150

## 2014-12-23 MED ORDER — NEPRO/CARBSTEADY PO LIQD
237.0000 mL | ORAL | Status: DC | PRN
  Filled 2014-12-23: qty 237

## 2014-12-23 MED ORDER — VANCOMYCIN HCL 10 G IV SOLR
1250.0000 mg | Freq: Once | INTRAVENOUS | Status: DC
  Filled 2014-12-23: qty 1250

## 2014-12-23 MED ORDER — VANCOMYCIN HCL IN DEXTROSE 1-5 GM/200ML-% IV SOLN: 1000.0000 mg | Freq: Once | INTRAVENOUS | Status: DC

## 2014-12-23 MED ORDER — SODIUM CHLORIDE 0.9 % IV BOLUS (SEPSIS)
250.0000 mL | Freq: Once | INTRAVENOUS | Status: AC
  Administered 2014-12-23: 250 mL via INTRAVENOUS

## 2014-12-23 MED ORDER — SODIUM CHLORIDE 0.9 % IV SOLN: 100.0000 mL | INTRAVENOUS | Status: DC | PRN

## 2014-12-23 MED ORDER — LEVOFLOXACIN IN D5W 500 MG/100ML IV SOLN
500.0000 mg | INTRAVENOUS | Status: DC
  Filled 2014-12-23: qty 100

## 2014-12-23 MED ORDER — BOOST / RESOURCE BREEZE PO LIQD
1.0000 | Freq: Two times a day (BID) | ORAL | Status: DC
  Administered 2014-12-23 – 2014-12-28 (×7): 1 via ORAL
  Filled 2014-12-23: qty 1

## 2014-12-23 MED ORDER — DEXTROSE 5 % IV SOLN
1.0000 g | INTRAVENOUS | Status: DC
  Administered 2014-12-24 – 2014-12-26 (×3): 1 g via INTRAVENOUS
  Filled 2014-12-23 (×5): qty 1

## 2014-12-23 MED ORDER — CHLORHEXIDINE GLUCONATE CLOTH 2 % EX PADS
6.0000 | MEDICATED_PAD | Freq: Every day | CUTANEOUS | Status: DC
  Administered 2014-12-24: 6 via TOPICAL

## 2014-12-23 MED ORDER — SODIUM CHLORIDE 0.9 % IV BOLUS (SEPSIS): 500.0000 mL | Freq: Once | INTRAVENOUS | Status: DC

## 2014-12-23 MED ORDER — WARFARIN SODIUM 5 MG PO TABS
5.0000 mg | ORAL_TABLET | Freq: Once | ORAL | Status: AC
  Administered 2014-12-23: 5 mg via ORAL
  Filled 2014-12-23: qty 1

## 2014-12-23 MED ORDER — PENTAFLUOROPROP-TETRAFLUOROETH EX AERO: 1.0000 "application " | INHALATION_SPRAY | CUTANEOUS | Status: DC | PRN

## 2014-12-23 MED ORDER — LIDOCAINE HCL (PF) 1 % IJ SOLN: 5.0000 mL | INTRAMUSCULAR | Status: DC | PRN

## 2014-12-23 MED ORDER — MUPIROCIN 2 % EX OINT
1.0000 "application " | TOPICAL_OINTMENT | Freq: Two times a day (BID) | CUTANEOUS | Status: AC
  Administered 2014-12-23 – 2014-12-28 (×10): 1 via NASAL
  Filled 2014-12-23 (×2): qty 22

## 2014-12-23 MED ORDER — VANCOMYCIN HCL IN DEXTROSE 750-5 MG/150ML-% IV SOLN
750.0000 mg | INTRAVENOUS | Status: DC
  Administered 2014-12-23 – 2014-12-28 (×3): 750 mg via INTRAVENOUS
  Filled 2014-12-23 (×7): qty 150

## 2014-12-23 MED ORDER — DEXTROSE 5 % IV SOLN
2.0000 g | Freq: Once | INTRAVENOUS | Status: AC
  Administered 2014-12-23: 2 g via INTRAVENOUS
  Filled 2014-12-23: qty 2

## 2014-12-23 MED ORDER — HEPARIN SODIUM (PORCINE) 1000 UNIT/ML DIALYSIS: 1000.0000 [IU] | INTRAMUSCULAR | Status: DC | PRN

## 2014-12-23 MED ORDER — ALTEPLASE 2 MG IJ SOLR
2.0000 mg | Freq: Once | INTRAMUSCULAR | Status: DC | PRN
  Filled 2014-12-23: qty 2

## 2014-12-23 NOTE — Progress Notes (Signed)
BP 72/51 on call paged; the patient received two enemas laxatives has had 6+ bm's,however denies discomfort

## 2014-12-23 NOTE — Consult Note (Signed)
Hospital Consult    Reason for Consult:  Check right groin and right thigh AVG Referring Physician:  [facility] MRN #:  [id number]  History of Present Illness: This is a 69 y.o. male who underwent placement of right thigh graft by Dr. Bridgett Larsson on Oct 20, 2014.  After surgery, he was started on a Lovenox bridge to coumadin per his PCP and unfortunately developed a hematoma of the right groin post operatively.  The skin at the femoral exposure dehisced and pt was started on IV ABx.  On 11/21/14, he was taken back to the operating room where he underwent evacuation of right groin hematoma and placement of wound vac.    During that hospital stay, VVS was called to see the pt about a possible ischemic right foot.  He stated that he had pain on the top of his midfoot earlier in the day and was painful to touch.  Upon Dr. Stephens Shire exam, his pain had resolved, his motor was intact and it was not any different that it had been the previous week.  An arterial duplex was obtained and revealed Triphasic/ biphasic flow in right CFA and right SFA. Right popliteal artery demonstrates monophasic flow with a 50-99% stenosis noted right distal popliteal artery. Monophasic flow noted in the right PTA and ATA.  Highly calcified vessels noted throughout entire right lower extremity.   The next day, pt was seen by Dr. 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 The arterial duplex reinforces the PAD concerns, given popliteal artery stenosis on imaging. Rather than waiting and observing for development of such, I favor: Aortogram, R leg angiogram, possible intervention. If he has a significant popliteal lesion, orbital atherectomy with angioplasty likely needed to avoid future gangrene in right foot.  He was taken to the [facility] lab on 11/25/14 and was found to have a chronic right superficial  femoral artery occlusion with reconstitution of the below-knee popliteal artery. Options would include an additional attempts at recanalization angioplasty and stenting. Other options would include revascularization with right femoral to below-knee popliteal bypass. I have discussed but these options with Dr. Bridgett Larsson who will determine which course of action would be best for the patient.  Vein mapping was obtained and Vein conduit appears inadequate on vein mapping. After reviewing the R leg runoff once again, this patient has two CTO in the distal SFA with high grade pop artery stenosis which is NOT likely to be responsive to endovascular intervention. Will need to discuss: Observation vs. R fem-pop with prosthetic vs. thigh AVG removal.  Pt elected for observation.  He was seen back in the office by Dr. Bridgett Larsson on 12/13/14 and pt was advised to continue wet to dry dressing changes.  His wound bed was clean with good granulation and subcutaneous tissue nearly at skin level.  At that time, he denied any pain in his foot, but did note some numbness in the right foot.  He presented to the hospital yesterday with complaints of 24 hours of worsening abdominal pain particularly in the LLQ with no radiation.  He described it as severe cramping.  Manual disimpaction was attempted in the ER, but pt did not tolerate.  The pt was admitted for observation.   At 2am this morning, he spiked a fever of 102.7 and the pt is being evaluated for sepsis.  VVS is called to evaluate his graft and  right groin wound.  Pt has hx of ESRD and dialyzes M/W/F at [facility].  His PMH is significant for heart transplant, failed renal transplant, DM, HTN, DVT, which he takes coumadin for.  He also has hx of CHF, MI and angina.  Pt states his dialysis catheter was placed at [facility] a couple of weeks ago.   Past Medical History  Diagnosis Date  . Diabetes mellitus   . Hypertension   . Myocardial infarction 1985; 1990  .  Angina   . Dysrhythmia   . DVT (deep venous thrombosis) ~ 12/2010    LLE  . Anemia   . Blood transfusion 06/1999    post heart transplant  . Peripheral vascular disease   . Renal failure     Hemodialysis MWF last 2 years, sse dr [name] nephrology, goes to [facility]  . DVT (deep venous thrombosis) 08/25/2014    Nearly occluded R IJ, and L subclavian DVT  . CHF (congestive heart failure) 2001    beffore transplant; had AICD that was explanted 07/19/99 following successful heart transplant  . Coronary artery disease     transplant cardiologist Dr. Corky Mull [facility])  . Pneumonia     Past Surgical History  Procedure Laterality Date  . Nephrectomy transplanted organ  2008  . Av fistula repair      rt. a=fore arm fistula  . Av fistula placement  ~ 01/2010    "this was my 2nd fistula placement"  . Heart transplant  06/1999  . Colonoscopy  03/17/2012    Procedure: COLONOSCOPY;  Surgeon: Lear Ng, MD;  Location: [facility];  Service: Endoscopy;  Laterality: N/A;  . Coronary artery bypass graft  1990    CABG X3  . Colonoscopy with propofol N/A 12/14/2013    Procedure: COLONOSCOPY WITH PROPOFOL;  Surgeon: Lear Ng, MD;  Location: [facility];  Service: Endoscopy;  Laterality: N/A;  . Video bronchoscopy Bilateral 04/14/2014    Procedure: VIDEO BRONCHOSCOPY WITH FLUORO;  Surgeon: Collene Gobble, MD;  Location: [place];  Service: Cardiopulmonary;  Laterality: Bilateral;  . Shuntogram Right 05/09/2014    Procedure: FISTULOGRAM;  Surgeon: Conrad Oak, MD;  Location: [facility] CATH LAB;  Service: Cardiovascular;  Laterality: Right;  . Coronary angioplasty with stent placement  1985; 1990    prior stents in the 80's and 90's then CABG; s/p heart transplant 2001 s/p BMS mid LAD and proximal RCA 04/21/13 [facility])  . Av fistula placement Right 10/20/2014    Procedure: INSERTION OF ARTERIOVENOUS (AV) GORE-TEX GRAFT THIGH;  Surgeon: Conrad Milford, MD;  Location:  [place];  Service: Vascular;  Laterality: Right;  . Hematoma evacuation Right 11/21/2014    Procedure: EVACUATION HEMATOMA Right Groin;  Surgeon: Conrad Foster, MD;  Location: [place];  Service: Vascular;  Laterality: Right;  . Application of wound vac Right 11/21/2014    Procedure: APPLICATION OF WOUND VAC Right Groin;  Surgeon: Conrad Verden, MD;  Location: [place];  Service: Vascular;  Laterality: Right;  . Peripheral vascular catheterization N/A 11/25/2014    Procedure: Abdominal Aortogram;  Surgeon: Elam Dutch, MD;  Location: [place] CV LAB;  Service: Cardiovascular;  Laterality: N/A;  . Lower extremity angiogram Right 11/25/2014    Procedure: Lower Extremity Angiogram;  Surgeon: Elam Dutch, MD;  Location: [place] CV LAB;  Service: Cardiovascular;  Laterality: Right;    Allergies  Allergen Reactions  . Lisinopril Swelling    Lips  and tongue swell  . Niacin And Related Other (See Comments)    unknown  . Norvasc [Amlodipine Besylate] Rash    Flushing  . Penicillins Rash    Prior to Admission medications   Medication Sig Start Date End Date Taking? Authorizing Provider  atorvastatin (LIPITOR) 40 MG tablet Take 40 mg by mouth daily.     Yes Historical Provider, MD  azaTHIOprine (IMURAN) 50 MG tablet Take 75 mg by mouth every morning.    Yes Historical Provider, MD  b complex-vitamin c-folic acid (NEPHRO-VITE) 0.8 MG TABS tablet Take 1 tablet by mouth at bedtime.   Yes Historical Provider, MD  calcium acetate (PHOSLO) 667 MG capsule Take 1 capsule (667 mg total) by mouth 2 (two) times daily with a meal. 12/14/14  Yes Ivan Anchors Love, PA-C  carvedilol (COREG) 6.25 MG tablet Take 1 tablet (6.25 mg total) by mouth at bedtime. 04/15/14  Yes Geradine Girt, DO  colesevelam (WELCHOL) 625 MG tablet Take 3,750 mg by mouth daily. Takes 6 tabs daily   Yes Historical Provider, MD  famotidine (PEPCID) 10 MG tablet Take 1 tablet (10 mg total) by mouth at bedtime. 12/14/14  Yes Ivan Anchors Love, PA-C    HUMALOG KWIKPEN 100 UNIT/ML SOPN Inject 4-10 Units into the skin 2 (two) times daily. Per sliding scale 11/17/12  Yes Historical Provider, MD  isosorbide dinitrate (ISOCHRON) 40 MG CR tablet Take 40 mg by mouth every 8 (eight) hours. 10/03/14  Yes Historical Provider, MD  oxyCODONE (OXY IR/ROXICODONE) 5 MG immediate release tablet Take 1 tablet (5 mg total) by mouth every 6 (six) hours as needed for moderate pain. 12/14/14  Yes Ivan Anchors Love, PA-C  predniSONE (DELTASONE) 5 MG tablet Take 1 tablet (5 mg total) by mouth daily with breakfast. 08/29/14  Yes Orson Eva, MD  senna-docusate (SENOKOT-S) 8.6-50 MG per tablet Take 3 tablets by mouth 2 (two) times daily. 12/14/14  Yes Ivan Anchors Love, PA-C  Sirolimus 0.5 MG TABS Take 3 tablets by mouth daily. 10/10/14  Yes Historical Provider, MD  warfarin (COUMADIN) 5 MG tablet Please consult: Inpatient pharmacy to continue dosing warfarin and monitor Patient taking differently: Take 5 mg by mouth daily. Please consult: Inpatient pharmacy to continue dosing warfarin and monitor 12/02/14  Yes Albertine Patricia, MD  B-D INS SYR HALF-UNIT .3CC/31G 31G X 5/16" 0.3 ML MISC USE AS DIRECTED UP TO 2-4 TIMES A DAY 11/02/14   Historical Provider, MD  pantoprazole (PROTONIX) 40 MG tablet Take 1 tablet (40 mg total) by mouth daily. Patient not taking: Reported on 12/22/2014 12/02/14   Albertine Patricia, MD    History   Social History  . Marital Status: Married    Spouse Name: N/A  . Number of Children: N/A  . Years of Education: N/A   Occupational History  . Not on file.   Social History Main Topics  . Smoking status: Former Smoker -- 1.00 packs/day for 20 years    Types: Cigarettes    Quit date: 05/27/1988  . Smokeless tobacco: Never Used  . Alcohol Use: Yes     Comment: "Holidays"  . Drug Use: No  . Sexual Activity: Not Currently   Other Topics Concern  . Not on file   Social History Narrative     Family History  Problem Relation Age of Onset  . Heart  disease Mother   . Hyperlipidemia Mother   . Hypertension Mother   . Diabetes Mother   . Hyperlipidemia Father   .  Hypertension Father   . Heart disease Father   . Diabetes Brother   . Heart disease Brother     Heart Disease before age 44  . Hyperlipidemia Brother   . Hypertension Brother   . Heart attack Brother     ROS: [x]  Positive   [ ]  Negative   [ ]  All sytems reviewed and are negative See HPI  Physical Examination  Filed Vitals:   12/23/14 0857  BP: 113/42  Pulse: 99  Temp: 99.2 F (37.3 C)  Resp:    Extremities: right groin wound is clean and almost healed.  There is a +thrill/bruit within the right thigh AVG.  There is no fluctuance around the graft.  Left IJ diatek catheter in place.   CBC    Component Value Date/Time   WBC 8.0 12/23/2014 0335   WBC 4.5 02/13/2012 1314   RBC 2.62* 12/23/2014 0335   RBC 2.15* 11/16/2014 1226   RBC 3.21* 02/13/2012 1314   HGB 7.6* 12/23/2014 0335   HGB 11.6* 02/13/2012 1314   HCT 23.9* 12/23/2014 0335   HCT 36.1* 02/13/2012 1314   PLT 76* 12/23/2014 0335   PLT 76* 02/13/2012 1314   MCV 91.2 12/23/2014 0335   MCV 112.6* 02/13/2012 1314   MCH 29.0 12/23/2014 0335   MCH 36.3* 02/13/2012 1314   MCHC 31.8 12/23/2014 0335   MCHC 32.3 02/13/2012 1314   RDW 19.4* 12/23/2014 0335   RDW 15.7* 02/13/2012 1314   LYMPHSABS 1.8 11/16/2014 1157   LYMPHSABS 0.8* 02/13/2012 1314   MONOABS 0.4 11/16/2014 1157   MONOABS 0.6 02/13/2012 1314   EOSABS 0.1 11/16/2014 1157   EOSABS 0.1 02/13/2012 1314   BASOSABS 0.0 11/16/2014 1157   BASOSABS 0.0 02/13/2012 1314    BMET    Component Value Date/Time   NA 136 12/23/2014 0335   K 4.4 12/23/2014 0335   CL 101 12/23/2014 0335   CO2 23 12/23/2014 0335   GLUCOSE 76 12/23/2014 0335   BUN 24* 12/23/2014 0335   CREATININE 6.24* 12/23/2014 0335   CALCIUM 8.7* 12/23/2014 0335   GFRNONAA 8* 12/23/2014 0335   GFRAA 9* 12/23/2014 0335    COAGS: Lab Results  Component Value Date   INR  2.70* 12/23/2014   INR 2.39* 12/22/2014   INR 1.88* 12/14/2014      ASSESSMENT/PLAN: This is a 69 y.o. male with hx of placement right thigh AVG and evacuation of hematoma and severe PAD and on chronic coumadin for DVT's admitted with fevers and abdominal pain.   -pt's right groin is very clean, almost healed and without any drainage.  The graft does have a +thrill/bruit and there is no fluctuance around the graft. -he does have a left IJ diatek catheter that was placed by [facility] per the pt. -continue sepsis workup per primary team. -Dr. Bridgett Larsson to see pt this afternoon.   Leontine Locket, PA-C [facility] [phone]  Addendum  I have independently interviewed and examined the patient, and I agree with the physician assistant's findings.  Clinically the R thigh AVG doesn't seem infected.  R groin is nearly healed.  Would continue wet-to-dry dressing to R groin.  I reviewed the R leg CT.  I'm not surprised there is some fluid around the anastomoses, as there always is fluid around the anastomoses as a potential space is created by dissecting down to the common femoral artery and vein.  There is no evidence of perigraft fluid around the rest of the graft,  which usually is seen if the graft is infected.  I don't see any advantage to exploring the groin as trying to drain the fluid collection will likely result in seeding the graft.  I would go ahead remove the [facility] (reportedly placed by Nephrology) and start using the R thigh AVG for HD.  If his fevers persist, would have to entertain his thigh arteriovenous graft as a potential source of infection.  Even then, I would try to treat through any infection as this patient is running out of options for permanent access.  Right foot remains without ischemic changes his acute ischemic sx have not recurred.  - Superficial wound cultures are useless as they usually reflected skin flora which is managed with the wet-to-dry dressing  anyway - Blood-culture x 2 including one from the catheter is recommended - Will check in on this patient on Monday   Adele Barthel, MD [facility] [place]: [phone] Pager: [phone]  12/23/2014, 2:00 PM

## 2014-12-23 NOTE — Progress Notes (Signed)
Hemodialysis- pt tolerating fair. "I feel so bad." R Fem AVG accessed as ordered with 17g needles at 1400. Running well. Catheter flushed and capped. 1550-BP drop to 112/55 with reported chest pain, relieved with 150cc saline. Uf to remain off for rest of treatment. Will bolus as needed per Dr. Jonnie Finner. Vancomycin given last hour. Pt given warm blankets, afebrile. Repositioning frequently for comfort.

## 2014-12-23 NOTE — Progress Notes (Signed)
Hemodialysis-Signed off treatment early d/t "I just feel terrible, i cant take anymore." Vitals WNL BP 142/58 HR 97 afebrile. Pt denies pain. Report called to [facility].

## 2014-12-23 NOTE — Progress Notes (Addendum)
ANTICOAGULATION CONSULT NOTE - Follow Up Consult  Pharmacy Consult for Coumadin & Vancomycin/Aztreonam/Levaquin Indication: Hx of DVT & Sepsis  Allergies  Allergen Reactions  . Lisinopril Swelling    Lips and tongue swell  . Niacin And Related Other (See Comments)    unknown  . Norvasc [Amlodipine Besylate] Rash    Flushing  . Penicillins Rash    Vital Signs: Temp: 99.7 F (37.6 C) (07/29 0515) Temp Source: Oral (07/29 0515) BP: 95/42 mmHg (07/29 0206) Pulse Rate: 99 (07/29 0515)  Labs:  Recent Labs  12/22/14 0432 12/22/14 1510 12/23/14 0335  HGB 7.4*  --  7.6*  HCT 23.1*  --  23.9*  PLT 115*  --  PENDING  LABPROT  --  25.8* 28.2*  INR  --  2.39* 2.70*  CREATININE 3.84*  --  6.24*    Estimated Creatinine Clearance: 10.3 mL/min (by C-G formula based on Cr of 6.24).  Assessment: 69 yo M presents on 7/28 with abdominal pain. PMH includes DM, ESRD, HTN, hx of DVT, CHF s/p heart transplant. Pharmacy to dose coumadin. Hgb low but stable at 7.6, plts 115. No s/s of bleed. INR this morning is therapeutic at 2.7.  PTA Coumadin 5mg  daily  Goal of Therapy:  INR 2-3 Monitor platelets by anticoagulation protocol: Yes   Plan:  Give coumadin 5mg  PO x 1  Monitor daily INR, CBC, s/s of bleed   Elberta Lachapelle J 12/23/2014,7:04 AM  ADDENDUM:  Patient spiked fever overnight with a tmax of 102.7, now 100.1. Was also found to be hypotensive this am with increased HR. WBC still wnl. Pharmacy consulted to start abx for sepsis. Patient is ESRD so will correlate vancomycin doses with HD. Plan for HD session later today.  Plan: Give aztreonam 2g IV x 1, then 1g IV Q24 Give vancomycin 1,250mg  IV x 1, then give 750mg  IV QHD-MWF Give levofloxacin 750mg  IV x 1, then start 500mg  IV Q48 Monitor clinical picture, HD sessions, VT at [facility] F/U C&S, abx deescalation / LOT

## 2014-12-23 NOTE — Procedures (Signed)
  I was present at this dialysis session, have reviewed the session itself and made  appropriate changes Rob Curtistine Pettitt MD (pgr) [phone]    (c) [phone] 11/12/2014, 10:31 AM    

## 2014-12-23 NOTE — Progress Notes (Signed)
CRITICAL VALUE ALERT  Critical value received:  Lactic Acid 2.5  Date of notification:  12/23/2014   Time of notification:  9470  Critical value read back:Yes.    Nurse who received alert:  Court Joy, RN  MD notified (1st page):  Renne Crigler  Time of first page:  1157  MD notified (2nd page):  Time of second page:  Responding MD:  Renne Crigler  Time MD responded:  1200

## 2014-12-23 NOTE — Progress Notes (Addendum)
Initial Nutrition Assessment  DOCUMENTATION CODES:   Not applicable  INTERVENTION:   -Boost Breeze po BID, each supplement provides 250 kcal and 9 grams of protein  NUTRITION DIAGNOSIS:   Increased nutrient needs related to wound healing as evidenced by estimated needs.  GOAL:   Patient will meet greater than or equal to 90% of their needs  MONITOR:   PO intake, Supplement acceptance, Diet advancement, Labs, Weight trends, Skin, I & O's  REASON FOR ASSESSMENT:   Malnutrition Screening Tool    ASSESSMENT:   Brandon Sharp is a 69 y.o. male With a history of diabetes, stage renal disease on hemodialysis, hypertension, peripheral vascular disease, DVT on chronic anticoagulation, congestive heart failure status post heart transplant. He was recently hospitalized earlier this month with end-stage renal disease and symptomatic anemia. He was in skilled until the 20th of this month, where he was discharged to home. He reports one week of constipation with no stooling. He has been having flatulence. He presents with 24 hours of worsening abdominal pain, particularly in the left lower quadrant with no radiation.  Pt admitted with constipation and fecal impaction.  Pt sleeping soundly at time of visit, with multiple blankets covering him. Limited nutrition-focused physical exam revealed no signs of fat or muscle depletion.  Spoke with RN who reports pt is tolerating clear liquid diet well currently. She confirms pt has had several BMs today. She shares that pt has been spiking a fever and BP is normalizing; plan is to transfer to SDU once bed is available.   Vascular surgery has seen pt; reviewed note with RN. Wounds are improving. Per RN, groin wound mostly healed. RD will add Boost Breeze supplement for additional nutrition support.   Diet Order:  Diet clear liquid Room service appropriate?: Yes; Fluid consistency:: Thin  Skin:  Wound (see comment) (rt groin incision, stage II  buttocks)  Last BM:  12/22/14  Height:   Ht Readings from Last 1 Encounters:  11/16/14 6' (1.829 m)    Weight:   Wt Readings from Last 1 Encounters:  12/23/14 169 lb 1.5 oz (76.7 kg)    Ideal Body Weight:  80.9 kg  BMI:  Body mass index is 22.93 kg/(m^2).  Estimated Nutritional Needs:   Kcal:  2100-2300  Protein:  100-115 grams  Fluid:  per MD  EDUCATION NEEDS:   No education needs identified at this time  [name] A. [name], RD, LDN, CDE Pager: [phone] After hours Pager: [phone]

## 2014-12-23 NOTE — Consult Note (Signed)
[facility] Renal Consultation Note    Indication for Consultation:  Management of ESRD/hemodialysis; anemia, hypertension/volume and secondary hyperparathyroidism PCP:  HPI: Brandon Sharp is a 69 y.o. male with ESRD who has hemodialysis MWF at [facility]. PMH significant for Heart transplant, failed kidney transplant,  DM, HTN, MI, Angina, DVT, CHF, PNA. Chronic coumadin for DVTs. Was recently DC'd from [facility] for symptomatic anemia, evacuation of R. Groin hematoma and placement of wound vac R. Femoral AVG. Patient returned to ED 12/22/2014 with C/O LLQ abdominal pain, flatulence and constipation. Was admitted as observation patient, given enemas without results.  At 0200 patient spiked temperature of 102.7 at 0200. Was given tylenol. This morning was found to be hypotensive, with increased HR, Temp 100.1. Decreased LOC for patient. [facility] hospitalist evaluating for sepsis.   Patient has hemodialysis at [facility]  .   Past Medical History  Diagnosis Date  . Diabetes mellitus   . Hypertension   . Myocardial infarction 1985; 1990  . Angina   . Dysrhythmia   . DVT (deep venous thrombosis) ~ 12/2010    LLE  . Anemia   . Blood transfusion 06/1999    post heart transplant  . Peripheral vascular disease   . Renal failure     Hemodialysis MWF last 2 years, sse dr [name] nephrology, goes to [facility]  . DVT (deep venous thrombosis) 08/25/2014    Nearly occluded R IJ, and L subclavian DVT  . CHF (congestive heart failure) 2001    beffore transplant; had AICD that was explanted 07/19/99 following successful heart transplant  . Coronary artery disease     transplant cardiologist Dr. Corky Mull [facility])  . Pneumonia    Past Surgical History  Procedure Laterality Date  . Nephrectomy transplanted organ  2008  . Av fistula repair      rt. a=fore arm fistula  . Av fistula placement  ~ 01/2010    "this was my 2nd  fistula placement"  . Heart transplant  06/1999  . Colonoscopy  03/17/2012    Procedure: COLONOSCOPY;  Surgeon: Lear Ng, MD;  Location: [facility];  Service: Endoscopy;  Laterality: N/A;  . Coronary artery bypass graft  1990    CABG X3  . Colonoscopy with propofol N/A 12/14/2013    Procedure: COLONOSCOPY WITH PROPOFOL;  Surgeon: Lear Ng, MD;  Location: [facility];  Service: Endoscopy;  Laterality: N/A;  . Video bronchoscopy Bilateral 04/14/2014    Procedure: VIDEO BRONCHOSCOPY WITH FLUORO;  Surgeon: Collene Gobble, MD;  Location: [place];  Service: Cardiopulmonary;  Laterality: Bilateral;  . Shuntogram Right 05/09/2014    Procedure: FISTULOGRAM;  Surgeon: Conrad Ullin, MD;  Location: [facility] CATH LAB;  Service: Cardiovascular;  Laterality: Right;  . Coronary angioplasty with stent placement  1985; 1990    prior stents in the 80's and 90's then CABG; s/p heart transplant 2001 s/p BMS mid LAD and proximal RCA 04/21/13 [facility])  . Av fistula placement Right 10/20/2014    Procedure: INSERTION OF ARTERIOVENOUS (AV) GORE-TEX GRAFT THIGH;  Surgeon: Conrad Hackberry, MD;  Location: [place];  Service: Vascular;  Laterality: Right;  . Hematoma evacuation Right 11/21/2014    Procedure: EVACUATION HEMATOMA Right Groin;  Surgeon: Conrad Mendon, MD;  Location: [place];  Service: Vascular;  Laterality: Right;  . Application of wound vac Right 11/21/2014    Procedure: APPLICATION OF WOUND VAC Right Groin;  Surgeon: Conrad Cook, MD;  Location: [facility] OR;  Service: Vascular;  Laterality: Right;  . Peripheral vascular catheterization N/A 11/25/2014    Procedure: Abdominal Aortogram;  Surgeon: Elam Dutch, MD;  Location: [place] CV LAB;  Service: Cardiovascular;  Laterality: N/A;  . Lower extremity angiogram Right 11/25/2014    Procedure: Lower Extremity Angiogram;  Surgeon: Elam Dutch, MD;  Location: [place] CV LAB;  Service: Cardiovascular;  Laterality: Right;   Family History   Problem Relation Age of Onset  . Heart disease Mother   . Hyperlipidemia Mother   . Hypertension Mother   . Diabetes Mother   . Hyperlipidemia Father   . Hypertension Father   . Heart disease Father   . Diabetes Brother   . Heart disease Brother     Heart Disease before age 31  . Hyperlipidemia Brother   . Hypertension Brother   . Heart attack Brother    Social History:  reports that he quit smoking about 26 years ago. His smoking use included Cigarettes. He has a 20 pack-year smoking history. He has never used smokeless tobacco. He reports that he drinks alcohol. He reports that he does not use illicit drugs. Allergies  Allergen Reactions  . Lisinopril Swelling    Lips and tongue swell  . Niacin And Related Other (See Comments)    unknown  . Norvasc [Amlodipine Besylate] Rash    Flushing  . Penicillins Rash   Prior to Admission medications   Medication Sig Start Date End Date Taking? Authorizing Provider  atorvastatin (LIPITOR) 40 MG tablet Take 40 mg by mouth daily.     Yes Historical Provider, MD  azaTHIOprine (IMURAN) 50 MG tablet Take 75 mg by mouth every morning.    Yes Historical Provider, MD  b complex-vitamin c-folic acid (NEPHRO-VITE) 0.8 MG TABS tablet Take 1 tablet by mouth at bedtime.   Yes Historical Provider, MD  calcium acetate (PHOSLO) 667 MG capsule Take 1 capsule (667 mg total) by mouth 2 (two) times daily with a meal. 12/14/14  Yes Ivan Anchors Love, PA-C  carvedilol (COREG) 6.25 MG tablet Take 1 tablet (6.25 mg total) by mouth at bedtime. 04/15/14  Yes Geradine Girt, DO  colesevelam (WELCHOL) 625 MG tablet Take 3,750 mg by mouth daily. Takes 6 tabs daily   Yes Historical Provider, MD  famotidine (PEPCID) 10 MG tablet Take 1 tablet (10 mg total) by mouth at bedtime. 12/14/14  Yes Ivan Anchors Love, PA-C  HUMALOG KWIKPEN 100 UNIT/ML SOPN Inject 4-10 Units into the skin 2 (two) times daily. Per sliding scale 11/17/12  Yes Historical Provider, MD  isosorbide dinitrate  (ISOCHRON) 40 MG CR tablet Take 40 mg by mouth every 8 (eight) hours. 10/03/14  Yes Historical Provider, MD  oxyCODONE (OXY IR/ROXICODONE) 5 MG immediate release tablet Take 1 tablet (5 mg total) by mouth every 6 (six) hours as needed for moderate pain. 12/14/14  Yes Ivan Anchors Love, PA-C  predniSONE (DELTASONE) 5 MG tablet Take 1 tablet (5 mg total) by mouth daily with breakfast. 08/29/14  Yes Orson Eva, MD  senna-docusate (SENOKOT-S) 8.6-50 MG per tablet Take 3 tablets by mouth 2 (two) times daily. 12/14/14  Yes Ivan Anchors Love, PA-C  Sirolimus 0.5 MG TABS Take 3 tablets by mouth daily. 10/10/14  Yes Historical Provider, MD  warfarin (COUMADIN) 5 MG tablet Please consult: Inpatient pharmacy to continue dosing warfarin and monitor Patient taking differently: Take 5 mg by mouth daily. Please consult: Inpatient pharmacy to continue dosing warfarin and monitor  12/02/14  Yes Silver Huguenin Elgergawy, MD  B-D INS SYR HALF-UNIT .3CC/31G 31G X 5/16" 0.3 ML MISC USE AS DIRECTED UP TO 2-4 TIMES A DAY 11/02/14   Historical Provider, MD  pantoprazole (PROTONIX) 40 MG tablet Take 1 tablet (40 mg total) by mouth daily. Patient not taking: Reported on 12/22/2014 12/02/14   Albertine Patricia, MD   Current Facility-Administered Medications  Medication Dose Route Frequency Provider Last Rate Last Dose  . acetaminophen (TYLENOL) tablet 650 mg  650 mg Oral Q6H PRN Truett Mainland, DO   650 mg at 12/23/14 6979   Or  . acetaminophen (TYLENOL) suppository 650 mg  650 mg Rectal Q6H PRN Truett Mainland, DO      . alum & mag hydroxide-simeth (MAALOX/MYLANTA) 200-200-20 MG/5ML suspension 30 mL  30 mL Oral Q6H PRN Tanna Savoy Stinson, DO      . atorvastatin (LIPITOR) tablet 40 mg  40 mg Oral Daily Tanna Savoy Stinson, DO   40 mg at 12/22/14 1422  . azaTHIOprine (IMURAN) tablet 75 mg  75 mg Oral q morning - 10a Tanna Savoy Stinson, DO   75 mg at 12/22/14 1420  . calcium acetate (PHOSLO) capsule 667 mg  667 mg Oral BID WC Tanna Savoy Stinson, DO   667 mg at  12/22/14 1857  . carvedilol (COREG) tablet 6.25 mg  6.25 mg Oral QHS Tanna Savoy Stinson, DO   6.25 mg at 12/22/14 2146  . colesevelam [facility]) tablet 3,750 mg  3,750 mg Oral Daily Tanna Savoy Stinson, DO   3,750 mg at 12/22/14 1422  . famotidine (PEPCID) tablet 10 mg  10 mg Oral QHS Tanna Savoy Stinson, DO   10 mg at 12/22/14 2144  . isosorbide dinitrate (DILATRATE-SR) capsule 40 mg  40 mg Oral TID Truett Mainland, DO   40 mg at 12/22/14 2145  . multivitamin (RENA-VIT) tablet 1 tablet  1 tablet Oral QHS Truett Mainland, DO   1 tablet at 12/22/14 2145  . predniSONE (DELTASONE) tablet 5 mg  5 mg Oral Q breakfast Tanna Savoy Stinson, DO      . senna-docusate (Senokot-S) tablet 3 tablet  3 tablet Oral BID Truett Mainland, DO   3 tablet at 12/22/14 2144  . Sirolimus TABS 1.5 mg  3 tablet Oral Daily Truett Mainland, DO   Stopped at 12/22/14 1230  . sodium chloride 0.9 % bolus 500 mL  500 mL Intravenous Once Jeryl Columbia, NP      . sorbitol, milk of mag, mineral oil, glycerin (SMOG) enema  500 mL Rectal Q4H PRN Tanna Savoy Stinson, DO      . warfarin (COUMADIN) tablet 5 mg  5 mg Oral q1800 Tanna Savoy Stinson, DO   5 mg at 12/22/14 1857  . warfarin (COUMADIN) tablet 5 mg  5 mg Oral ONCE-1800 Cecilio Asper Fuig, RPH      . Warfarin - Pharmacist Dosing Inpatient   Does not apply [place], DO       Labs: Basic Metabolic Panel:  Recent Labs Lab 12/22/14 0432 12/23/14 0335  NA 137 136  K 3.3* 4.4  CL 97* 101  CO2 29 23  GLUCOSE 102* 76  BUN 11 24*  CREATININE 3.84* 6.24*  CALCIUM 8.9 8.7*   Liver Function Tests:  Recent Labs Lab 12/22/14 0432  AST 28  ALT 12*  ALKPHOS 66  BILITOT 1.1  PROT 5.6*  ALBUMIN 2.9*   No results for input(s): LIPASE,  AMYLASE in the last 168 hours. No results for input(s): AMMONIA in the last 168 hours. CBC:  Recent Labs Lab 12/22/14 0432 12/23/14 0335  WBC 7.7 8.0  HGB 7.4* 7.6*  HCT 23.1* 23.9*  MCV 91.7 91.2  PLT 115* 76*   Cardiac Enzymes: No  results for input(s): CKTOTAL, CKMB, CKMBINDEX, TROPONINI in the last 168 hours. CBG: No results for input(s): GLUCAP in the last 168 hours. Iron Studies: No results for input(s): IRON, TIBC, TRANSFERRIN, FERRITIN in the last 72 hours. Studies/Results: Ct Abdomen Pelvis W Contrast  12/22/2014   CLINICAL DATA:  Abdominal pain and constipation. Post right groin hematoma evacuation 1 month prior.  EXAM: CT ABDOMEN AND PELVIS WITH CONTRAST  TECHNIQUE: Multidetector CT imaging of the abdomen and pelvis was performed using the standard protocol following bolus administration of intravenous contrast.  CONTRAST:  145mL OMNIPAQUE IOHEXOL 300 MG/ML  SOLN  COMPARISON:  CT 05/06/2014  FINDINGS: Lower chest: Cardiomegaly. Mild subpleural reticulation consistent chronic lung disease in the lingula and right middle lobe.  Hepatobiliary: Scattered small hypodensities, likely cysts or hemangiomas, with the largest cyst measuring 2.1 cm in the right lobe. Gallbladder is physiologically distended. No biliary dilatation.  Pancreas: Mildly atrophic without ductal dilatation or focal abnormality.  Spleen: Normal.  Adrenals/Urinary Tract: Bilateral renal parenchymal atrophy. Renal parenchyma near completely replaced with hypodense lesions of varying sizes. Some of the cyst superior hyperdense. No hydronephrosis. Partially calcified cysts the right renal hilum, unchanged in size from prior. The adrenal glands are normal. And transplant kidney in the right iliac fossa is again seen and not significantly changed in appearance. No transplant hydronephrosis.  Stomach/Bowel: Stomach is physiologically distended. There are no dilated or thickened bowel loops. The appendix is normal. Small to moderate volume of stool in the proximal colon. Increased stool burden distally with stool ball distends the rectum. Distal colonic diverticulosis without diverticulitis.  Vascular/Lymphatic: Dense atherosclerosis of the abdominal aorta and its  branches. No aneurysm. No retroperitoneal adenopathy. Right groin fluid collection insinuating about the right femoral vessels and presumed graft material. Fluid collection measures approximately 5.1 cm in greatest dimension. There is no peripheral enhancement or thick wall. Overlying subcutaneous defect, with skin appearing intact.  Reproductive: Prostate gland is trauma in size.  Urinary bladder:  Near completely decompressed.  Other:  No ascites.  No intra-abdominal fluid collection.  Musculoskeletal: There are no acute or suspicious osseous abnormalities. Scattered degenerative change in the lumbar spine. Advanced degenerative change of the left hip.  IMPRESSION: 1. Increased stool burden distally with stool ball distending the rectum, supports clinical diagnosis of constipation. No bowel obstruction. 2. Elongated fluid density structure in the right groin, likely sequela of prior hematoma. No CT findings of infection. 3. Additional stable chronic findings include diverticulosis without diverticulitis, atrophy and cystic replacement of the native kidneys with right lower quadrant renal transplant, hepatic cysts, and advanced atherosclerosis.   Electronically Signed   By: Jeb Levering M.D.   On: 12/22/2014 06:41    ROS: As per HPI otherwise negative.   Physical Exam: Filed Vitals:   12/22/14 1225 12/22/14 2109 12/23/14 0206 12/23/14 0515  BP: 112/43 136/50 95/42   Pulse: 107 118 112 99  Temp: 99.5 F (37.5 C) 98.3 F (36.8 C) 102.7 F (39.3 C) 99.7 F (37.6 C)  TempSrc: Oral Oral Oral Oral  Resp: 18 18 18 19   SpO2: 93% 97% 96% 94%     General: Patient appears lethargic, minimally responsive to verbal at present. Shivering,  positioned on side. Head: Normocephalic, atraumatic, sclera non-icteric, mucus membranes are moist Neck: Supple. JVD not elevated. Lungs: Clear bilaterally to auscultation without wheezes, rales, or rhonchi. Breathing is unlabored. Heart: RRR with S1 S2. No murmurs,  rubs, or gallops appreciated. Abdomen: Soft, non-tender, non-distended with normoactive bowel sounds. No rebound/guarding. No obvious abdominal masses. Extremities: R FA warm to touch, firm with generalized edema RUE.  Incision R. Femoral with minimal serous drainage. No edema LE.  Skin: hot to touch, intact, no cyanosis, no rashes, prurititis. Neuro: Alert and oriented X 3. Moves all extremities spontaneously. Psych:  Responds to questions appropriately with a normal affect. Dialysis Access: LIJ Tunneled HD catheter. R Femoral AVG maturing, +thrill + Bruit RUF AVF + thrill -bruit no pus at exit site of cath  Dialysis Orders: Center: [facility]  on MWF . EDW 68 kg HD Bath 2.0 K 2.0 Ca  Time 4 hours Heparin No Heparin. Access LIJ per cath BFR 400 DFR Autoflow 1.5 Linear sodium    Aranesp 200 mcg IV (last dose 12/21/2014) Hectoral 3 mcg IV MWF (Last dose 12/21/2014)  Assessment/Plan: 1.  Sepsis:  Unclear source of infection. Being worked up per primary. VVS asked to evaluate R. Femoral AVG. w recent hematoma wound evacuation/ VAC placement 2. Constipation: Per primary 3.  ESRD -  MWF at [place]. No Heparin. HD later today. 4.  Hypotension/volume  - SBP currently in the 80s with 250 NS IVF bolus infusing. Has rec'd 750 cc NS for BP since 0600. Wt 76 kg (bed wt). Out pt EDW 68 KGs. Will order HD to run even for now. 5.  Anemia  - Hgb 7.6 follow CBCs 6.  Metabolic bone disease - Ca 8.7 C Ca 9.58 continue OP Binders 7.  Nutrition - clear liquids. Will advance diet as appropriate 8.  Immunosuppressant Therapy: Cont Sirolimus and prednisone.  9.    DVT:  Bilateral UE. Continue Coumadin per Pharmacy RUE swelling is chronic and arm doesn't look infected acutely  Rita H. Owens Shark, NP-C 12/23/2014, 7:56 AM  [employer] [phone]  Pt seen, examined, agree w assess/plan as above with additions as indicated. 69 yo with ESRD, heart transplant , hx failed renal Tx presenting with hypotension/  fevers/ malaise.  Exam w/o clear source of infection, the groin wound looks good and the swollen R arm is most likely chronic w/o signs of acute access infection. He does have a tunneled HD cath and is at risk for cath-related sepsis, exit site is clean.  Blood cx's are pending, empiric abx per primary. Below dry wt and getting IVF's for hypotension. Plan is for gentle HD today, no fluid off, f/u cx's, exam.   Kelly Splinter MD pager [phone]    cell [phone] 12/23/2014, 1:16 PM

## 2014-12-23 NOTE — Progress Notes (Signed)
PROGRESS NOTE  Brandon Sharp:[id number] DOB: 05-04-1946 DOA: 12/22/2014 PCP: [name], MD   HPI: 69 year old male with ESRD on HD, type 2 diabetes mellitus, HTN, peripheral vascular disease, DVT on chronic anticoagulation, CHF s/p heart transplant presented to the ED for constipation x 1 week. He had been in a SNIF until 7/20 where he was discharge home after being hospitalized for ESRD with symptomatic anemia. He has not passed stool for one week, but has passed flatus. He complains of abdominal pain that was worsening 24 hours prior to admission and described as a cramping. Manual disimpaction and a smog enema were attempted in the ED, which the patient did not tolerate. Patient denies nausea, vomiting, diarrhea, chest pain, shortness of breath, melena, hematochezia, and urinary symptoms.    Subjective / 24 H Interval events Patient reports feeling unchanged from overnight. He is unable to recall why he went to the ED. At 0200 his temperature spiked to 102.7 and was given Tylenol. He was hypotensive with an increased HR this morning and some decreased LOC from baseline.   Assessment/Plan: Principal Problem:   Sepsis Active Problems:   DVT (deep venous thrombosis)   Heart transplanted   CKD (chronic kidney disease), stage V   ESRD on hemodialysis   Constipation   Fecal impaction  Sepsis of unclear source - sepsis protocol started this morning, patient with fever, decreased mentation, transfer to SDU. Antibiotics STAT. - Febrile in the ED as well as overnight, bried tachycardia and hypotension, unidentified source of infection, WBC 8 - Lactic acid 2.5, procalcitonin 49.20 - Levofloxacin/Vanc/aztreonam started 12/23/14 given pen allergy - CXR negative for pneumonia, small right pleural effusion - Wound culture, blood cultures pending, HD cath culture pending as well - remove HD cath and send tip for culture   Constipation/Fecal Impaction - Per patient, has been  constipated x 1 week - CT abdomen pelvis demonstrated increased stool burden without bowel obstruction  - improved  S/P heart transplant - Transplant 07/10/2009 at [facility].  - On imuran and prednisone - Echo 07/2013: LVEF 55-60%, no wall motion abnormalities - Monitor via telemetry  Hx of DVT, on chronic anticoagulation - On warfarin  Hypertension - Some episodes of hypotension in the AM, resolved with NS IVF bolus - On carvedilol, continue to monitor   ESRD - History of renal transplant and rejection  - On Hemodialysis M/W/F - nephrology following  Anemia due to chronic kidney disease - Hgb 7.6, Hct 23.9 (increased from 7.4/23.1 on 7/28). Baseline Hbg is ~8 - Continue to monitor, transfuse if needed  Type 2 Diabetes Mellitus - Controlled with short acting insulin - CBG has been 75-122, no insulin needed - Monitor CBG  Thrombocytopenia - has been chronically low, no bleeding   Diet: Diet clear liquid Room service appropriate?: Yes; Fluid consistency:: Thin Fluids: NS DVT Prophylaxis: Heparin  Code Status: DNR Family Communication: Patient Disposition Plan: Remain inpatient  Consultants:  Vascular surgery  Nephrology   Antibiotics   Levofloxacin 12/23/14 >>  Vancomycin 12/23/14 >>  Aztreonam 12/23/14 >>   Studies  Ct Abdomen Pelvis W Contrast  12/22/2014   CLINICAL DATA:  Abdominal pain and constipation. Post right groin hematoma evacuation 1 month prior.  EXAM: CT ABDOMEN AND PELVIS WITH CONTRAST  TECHNIQUE: Multidetector CT imaging of the abdomen and pelvis was performed using the standard protocol following bolus administration of intravenous contrast.  CONTRAST:  128mL OMNIPAQUE IOHEXOL 300 MG/ML  SOLN  COMPARISON:  CT 05/06/2014  FINDINGS: Lower  chest: Cardiomegaly. Mild subpleural reticulation consistent chronic lung disease in the lingula and right middle lobe.  Hepatobiliary: Scattered small hypodensities, likely cysts or hemangiomas, with the largest cyst  measuring 2.1 cm in the right lobe. Gallbladder is physiologically distended. No biliary dilatation.  Pancreas: Mildly atrophic without ductal dilatation or focal abnormality.  Spleen: Normal.  Adrenals/Urinary Tract: Bilateral renal parenchymal atrophy. Renal parenchyma near completely replaced with hypodense lesions of varying sizes. Some of the cyst superior hyperdense. No hydronephrosis. Partially calcified cysts the right renal hilum, unchanged in size from prior. The adrenal glands are normal. And transplant kidney in the right iliac fossa is again seen and not significantly changed in appearance. No transplant hydronephrosis.  Stomach/Bowel: Stomach is physiologically distended. There are no dilated or thickened bowel loops. The appendix is normal. Small to moderate volume of stool in the proximal colon. Increased stool burden distally with stool ball distends the rectum. Distal colonic diverticulosis without diverticulitis.  Vascular/Lymphatic: Dense atherosclerosis of the abdominal aorta and its branches. No aneurysm. No retroperitoneal adenopathy. Right groin fluid collection insinuating about the right femoral vessels and presumed graft material. Fluid collection measures approximately 5.1 cm in greatest dimension. There is no peripheral enhancement or thick wall. Overlying subcutaneous defect, with skin appearing intact.  Reproductive: Prostate gland is trauma in size.  Urinary bladder:  Near completely decompressed.  Other:  No ascites.  No intra-abdominal fluid collection.  Musculoskeletal: There are no acute or suspicious osseous abnormalities. Scattered degenerative change in the lumbar spine. Advanced degenerative change of the left hip.  IMPRESSION: 1. Increased stool burden distally with stool ball distending the rectum, supports clinical diagnosis of constipation. No bowel obstruction. 2. Elongated fluid density structure in the right groin, likely sequela of prior hematoma. No CT findings of  infection. 3. Additional stable chronic findings include diverticulosis without diverticulitis, atrophy and cystic replacement of the native kidneys with right lower quadrant renal transplant, hepatic cysts, and advanced atherosclerosis.   Electronically Signed   By: Jeb Levering M.D.   On: 12/22/2014 06:41   Dg Chest Port 1 View  12/23/2014   CLINICAL DATA:  Sepsis  EXAM: PORTABLE CHEST - 1 VIEW  COMPARISON:  November 11, 2014  FINDINGS: There is a small pleural effusion on the right with a right base atelectasis. Elsewhere lungs are clear. Heart is borderline enlarged with pulmonary vascularity within normal limits. Central catheter tip is at the cavoatrial junction. No pneumothorax. There are stents in each axillary region.  IMPRESSION: Small right pleural effusion with right base atelectasis. Lungs elsewhere clear. Heart prominent but stable. No pneumothorax.   Electronically Signed   By: Lowella Grip III M.D.   On: 12/23/2014 08:40   Objective  Filed Vitals:   12/23/14 1330 12/23/14 1335 12/23/14 1400 12/23/14 1430  BP: 154/57 165/67 121/58 95/53  Pulse: 95 95 98 99  Temp:      TempSrc:      Resp:      Weight:      SpO2: 92% 93%      Intake/Output Summary (Last 24 hours) at 12/23/14 1447 Last data filed at 12/23/14 0940  Gross per 24 hour  Intake    120 ml  Output      1 ml  Net    119 ml   Filed Weights   12/23/14 1315  Weight: 76.7 kg (169 lb 1.5 oz)   Exam:  General:  Patient laying in bed in no acute distress. He has some decreased mentation  and is unable to answer questions clearly, but he is alert and follows commands   HEENT: no scleral icterus  Cardiovascular: RRR without MRG, 2+ peripheral pulses, no edema  Respiratory: CTA biL, good air movement, no wheezing, no crackles, no rales  Abdomen: soft, non tender, BS +, no guarding  MSK/Extremities: no clubbing/cyanosis, no joint swelling, approximately 7 cm wound from right thigh graft in right groin fold that  was recently cleaned, no drainage from site   Skin: no rashes  Neuro: non focal  Data Reviewed: Basic Metabolic Panel:  Recent Labs Lab 12/22/14 0432 12/23/14 0335  NA 137 136  K 3.3* 4.4  CL 97* 101  CO2 29 23  GLUCOSE 102* 76  BUN 11 24*  CREATININE 3.84* 6.24*  CALCIUM 8.9 8.7*   Liver Function Tests:  Recent Labs Lab 12/22/14 0432  AST 28  ALT 12*  ALKPHOS 66  BILITOT 1.1  PROT 5.6*  ALBUMIN 2.9*   CBC:  Recent Labs Lab 12/22/14 0432 12/23/14 0335  WBC 7.7 8.0  HGB 7.4* 7.6*  HCT 23.1* 23.9*  MCV 91.7 91.2  PLT 115* 76*    ProBNP (last 3 results)  Recent Labs  03/27/14 1747 04/12/14 0105  PROBNP 37853.0* 45045.0*    Recent Results (from the past 240 hour(s))  MRSA PCR Screening     Status: None   Collection Time: 12/22/14 12:37 PM  Result Value Ref Range Status   MRSA by PCR NEGATIVE NEGATIVE Final    Comment:        The GeneXpert MRSA Assay (FDA approved for NASAL specimens only), is one component of a comprehensive MRSA colonization surveillance program. It is not intended to diagnose MRSA infection nor to guide or monitor treatment for MRSA infections.     Scheduled Meds: . atorvastatin  40 mg Oral Daily  . azaTHIOprine  75 mg Oral q morning - 10a  . [START ON 12/24/2014] aztreonam  1 g Intravenous Q24H  . calcium acetate  667 mg Oral BID WC  . carvedilol  6.25 mg Oral QHS  . colesevelam  3,750 mg Oral Daily  . famotidine  10 mg Oral QHS  . feeding supplement  1 Container Oral BID BM  . isosorbide dinitrate  40 mg Oral TID  . [START ON 12/25/2014] levofloxacin (LEVAQUIN) IV  500 mg Intravenous Q48H  . multivitamin  1 tablet Oral QHS  . predniSONE  5 mg Oral Q breakfast  . senna-docusate  3 tablet Oral BID  . Sirolimus  3 tablet Oral Daily  . sodium chloride  500 mL Intravenous Once  . vancomycin  1,250 mg Intravenous Once  . vancomycin  750 mg Intravenous Q M,W,F-HD  . warfarin  5 mg Oral q1800  . warfarin  5 mg Oral  ONCE-1800  . Warfarin - Pharmacist Dosing Inpatient   Does not apply [place], PA-S  Time spent: 35 minutes coordinating care with consultants, evaluating and transferring patient to SDU due to sepsis.  Marzetta Board, MD [facility] Pager [phone]. If 7 PM - 7 AM, please contact night-coverage at [email or web address], password [facility] 12/23/2014, 2:47 PM  LOS: 0 days

## 2014-12-23 NOTE — Discharge Instructions (Signed)

## 2014-12-23 NOTE — Progress Notes (Signed)
     Plan to pull dialysis catheter in the morning first thing and send tip for culture.  Leilanni Halvorson MAUREEN PA-C

## 2014-12-23 NOTE — Progress Notes (Signed)
Report given to Drue Dun, RN on [place]. Pt to transfer to 2C14 after HD.

## 2014-12-24 ENCOUNTER — Inpatient Hospital Stay (HOSPITAL_COMMUNITY): Payer: Medicare Other

## 2014-12-24 DIAGNOSIS — N185 Chronic kidney disease, stage 5: Secondary | ICD-10-CM

## 2014-12-24 LAB — CBC
HCT: 20.5 % — ABNORMAL LOW (ref 39.0–52.0)
Hemoglobin: 6.4 g/dL — CL (ref 13.0–17.0)
MCH: 29 pg (ref 26.0–34.0)
MCHC: 31.2 g/dL (ref 30.0–36.0)
MCV: 92.8 fL (ref 78.0–100.0)
Platelets: 65 10*3/uL — ABNORMAL LOW (ref 150–400)
RBC: 2.21 MIL/uL — ABNORMAL LOW (ref 4.22–5.81)
RDW: 19.4 % — AB (ref 11.5–15.5)
WBC: 4.8 10*3/uL (ref 4.0–10.5)

## 2014-12-24 LAB — PROTIME-INR
INR: 3.82 — AB (ref 0.00–1.49)
PROTHROMBIN TIME: 36.7 s — AB (ref 11.6–15.2)

## 2014-12-24 LAB — PREPARE RBC (CROSSMATCH)

## 2014-12-24 MED ORDER — DOXERCALCIFEROL 4 MCG/2ML IV SOLN
3.0000 ug | INTRAVENOUS | Status: DC
  Administered 2014-12-26 – 2014-12-30 (×3): 3 ug via INTRAVENOUS
  Filled 2014-12-24 (×3): qty 2

## 2014-12-24 MED ORDER — OXYCODONE-ACETAMINOPHEN 5-325 MG PO TABS
1.0000 | ORAL_TABLET | ORAL | Status: DC | PRN
  Administered 2014-12-24 – 2014-12-26 (×5): 1 via ORAL
  Filled 2014-12-24 (×5): qty 1

## 2014-12-24 MED ORDER — SODIUM CHLORIDE 0.9 % IV SOLN
Freq: Once | INTRAVENOUS | Status: AC
  Administered 2014-12-24: 16:00:00 via INTRAVENOUS

## 2014-12-24 MED ORDER — DARBEPOETIN ALFA 200 MCG/0.4ML IJ SOSY
200.0000 ug | PREFILLED_SYRINGE | INTRAMUSCULAR | Status: DC
  Administered 2014-12-28: 200 ug via INTRAVENOUS
  Filled 2014-12-24: qty 0.4

## 2014-12-24 NOTE — Progress Notes (Signed)
      Due to Emergency surgery today we were unavailable to remove the Diatek catheter.   Will come by tomorrow for removal.    Theda Sers, Matsue Strom MAUREEN PA-C

## 2014-12-24 NOTE — Progress Notes (Signed)
ANTICOAGULATION CONSULT NOTE - Follow Up Consult  Pharmacy Consult for Coumadin & Vancomycin/Aztreonam/Levaquin Indication: Hx of DVT & Sepsis  Allergies  Allergen Reactions  . Lisinopril Swelling    Lips and tongue swell  . Niacin And Related Other (See Comments)    unknown  . Norvasc [Amlodipine Besylate] Rash    Flushing  . Penicillins Rash    Vital Signs: Temp: 97.3 F (36.3 C) (07/30 0538) Temp Source: Oral (07/30 0538) BP: 119/48 mmHg (07/30 0300) Pulse Rate: 83 (07/30 0538)  Labs:  Recent Labs  12/22/14 0432 12/22/14 1510 12/23/14 0335 12/23/14 1050 12/24/14 0232  HGB 7.4*  --  7.6*  --  6.4*  HCT 23.1*  --  23.9*  --  20.5*  PLT 115*  --  76*  --  65*  APTT  --   --   --  39*  --   LABPROT  --  25.8* 28.2*  --  36.7*  INR  --  2.39* 2.70*  --  3.82*  CREATININE 3.84*  --  6.24*  --   --    Estimated Creatinine Clearance: 12.2 mL/min (by C-G formula based on Cr of 6.24).  Assessment: 69 yo M presents on 7/28 with abdominal pain. PMH includes DM, ESRD, HTN, hx of DVT, CHF s/p heart transplant.  Pharmacy consulted to dose warfarin and antibiotics  Anticoagulation: hx of DVT. PTA warfarin 5mg  daily, admit INR 2.39.  INR now supertherapeutic after starting broad spectrum antibiotics for sepsis.  H/H trending down significantly and platelets trending down.  No bleeding noted.  Infectious Disease: Day #2 of abx for sepsis. Patient spiked fever 7/29 tmax of 102.7,  now afeb. WBC  wnl. MRSA PCR negative.; plan to pull HD catheter 7/30 and culture tip Azt 7/29>> LQ 7/29>> Vanc 7/29>> 7/29 wound 7/29 blood x 2  Goal of Therapy:  INR 2-3 Monitor platelets by anticoagulation protocol: Yes   Plan:  No warfarin today Monitor daily INR, CBC, s/s of bleed Continue aztreonam vancomycin 750mg  IV QHD-MWF, levofloxacin  Monitor clinical picture, cultures, HD sessions, VT at [facility] and antibiotic deescalation / LOT F/U need for Levaquin? F/U family bringing in patient  sirolimus  Thank you for allowing pharmacy to be a part of this patients care team.  Rowe Robert Pharm.D., BCPS, AQ-Cardiology Clinical Pharmacist 12/24/2014 8:05 AM Pager: [phone] Phone: [phone]

## 2014-12-24 NOTE — Progress Notes (Signed)
PROGRESS NOTE  SKIP LITKE EXB:[id number] DOB: November 05, 1945 DOA: 12/22/2014 PCP: [name], MD  HPI: 69 year old male with ESRD on HD, type 2 diabetes mellitus, HTN, peripheral vascular disease, DVT on chronic anticoagulation, CHF s/p heart transplant presented to the ED for constipation x 1 week. He had been in a SNIF until 7/20 where he was discharge home after being hospitalized for ESRD with symptomatic anemia. He has not passed stool for one week, but has passed flatus. He complains of abdominal pain that was worsening 24 hours prior to admission and described as a cramping. Manual disimpaction and a smog enema were attempted in the ED, which the patient did not tolerate. Patient denies nausea, vomiting, diarrhea, chest pain, shortness of breath, melena, hematochezia, and urinary symptoms.   Subjective / 24 H Interval events - fever curve improved, he doesn't feel good but can't tell me exactly why - complains of right tibial pain, new onset this morning  Assessment/Plan: Principal Problem:   Sepsis Active Problems:   DVT (deep venous thrombosis)   Heart transplanted   CKD (chronic kidney disease), stage V   ESRD on hemodialysis   Constipation   Fecal impaction  Sepsis of unclear source - sepsis physiology improving - source not obvious yet, cultures negative - Levofloxacin/Vanc/aztreonam started 12/23/14 given pen allergy - CXR negative for pneumonia, small right pleural effusion   Constipation/Fecal Impaction - Per patient, has been constipated x 1 week - CT abdomen pelvis demonstrated increased stool burden without bowel obstruction  - improved  S/P heart transplant - Transplant 07/10/2009 at [facility].  - On imuran and prednisone - Echo 07/2013: LVEF 55-60%, no wall motion abnormalities - Monitor via telemetry  Hx of DVT, on chronic anticoagulation - On warfarin  Hypertension - On carvedilol, continue to monitor   ESRD - History of renal transplant and  rejection  - On Hemodialysis M/W/F - nephrology following  Anemia due to chronic kidney disease / worsening in the setting of sepsis - Hgb 7.6, Hct 23.9 (increased from 7.4/23.1 on 7/28). Baseline Hbg is ~8 - Continue to monitor, transfusing 1U pRBC 7/30 due to Hb 6.4, no bleeding noted  Type 2 Diabetes Mellitus - Controlled with short acting insulin - CBG has been 75-122, no insulin needed - Monitor CBG  Thrombocytopenia - has been chronically low, no bleeding   Diet: Diet clear liquid Room service appropriate?: Yes; Fluid consistency:: Thin Fluids: NS DVT Prophylaxis: Heparin  Code Status: DNR Family Communication: Patient Disposition Plan: Remain inpatient  Consultants:  Vascular surgery  Nephrology   Antibiotics   Levofloxacin 12/23/14 >>  Vancomycin 12/23/14 >>  Aztreonam 12/23/14 >>   Studies  Dg Chest Port 1 View  12/23/2014   CLINICAL DATA:  Sepsis  EXAM: PORTABLE CHEST - 1 VIEW  COMPARISON:  November 11, 2014  FINDINGS: There is a small pleural effusion on the right with a right base atelectasis. Elsewhere lungs are clear. Heart is borderline enlarged with pulmonary vascularity within normal limits. Central catheter tip is at the cavoatrial junction. No pneumothorax. There are stents in each axillary region.  IMPRESSION: Small right pleural effusion with right base atelectasis. Lungs elsewhere clear. Heart prominent but stable. No pneumothorax.   Electronically Signed   By: Lowella Grip III M.D.   On: 12/23/2014 08:40   Objective  Filed Vitals:   12/24/14 0100 12/24/14 0200 12/24/14 0300 12/24/14 0538  BP: 131/49 106/36 119/48   Pulse:  90  83  Temp:    97.3  F (36.3 C)  TempSrc:    Oral  Resp: 20 19 17 17   Height:      Weight:      SpO2:    100%    Intake/Output Summary (Last 24 hours) at 12/24/14 0640 Last data filed at 12/24/14 0400  Gross per 24 hour  Intake    460 ml  Output   -362 ml  Net    822 ml   Filed Weights   12/23/14 1315  12/23/14 1653  Weight: 76.7 kg (169 lb 1.5 oz) 77 kg (169 lb 12.1 oz)   Exam:  General:  Patient laying in bed in no acute distress.  HEENT: no scleral icterus  Cardiovascular: RRR without MRG, 2+ peripheral pulses, no edema  Respiratory: CTA biL, good air movement, no wheezing, no crackles, no rales  Abdomen: soft, non tender, BS +, no guarding  MSK/Extremities: no clubbing/cyanosis, no joint swelling, right thigh wound without drainage  Skin: no rashes  Neuro: non focal  Data Reviewed: Basic Metabolic Panel:  Recent Labs Lab 12/22/14 0432 12/23/14 0335  NA 137 136  K 3.3* 4.4  CL 97* 101  CO2 29 23  GLUCOSE 102* 76  BUN 11 24*  CREATININE 3.84* 6.24*  CALCIUM 8.9 8.7*   Liver Function Tests:  Recent Labs Lab 12/22/14 0432  AST 28  ALT 12*  ALKPHOS 66  BILITOT 1.1  PROT 5.6*  ALBUMIN 2.9*   CBC:  Recent Labs Lab 12/22/14 0432 12/23/14 0335 12/24/14 0232  WBC 7.7 8.0 4.8  HGB 7.4* 7.6* 6.4*  HCT 23.1* 23.9* 20.5*  MCV 91.7 91.2 92.8  PLT 115* 76* PENDING    ProBNP (last 3 results)  Recent Labs  03/27/14 1747 04/12/14 0105  PROBNP 37853.0* 45045.0*    Recent Results (from the past 240 hour(s))  MRSA PCR Screening     Status: None   Collection Time: 12/22/14 12:37 PM  Result Value Ref Range Status   MRSA by PCR NEGATIVE NEGATIVE Final    Comment:        The GeneXpert MRSA Assay (FDA approved for NASAL specimens only), is one component of a comprehensive MRSA colonization surveillance program. It is not intended to diagnose MRSA infection nor to guide or monitor treatment for MRSA infections.     Scheduled Meds: . sodium chloride   Intravenous Once  . atorvastatin  40 mg Oral Daily  . azaTHIOprine  75 mg Oral q morning - 10a  . aztreonam  1 g Intravenous Q24H  . calcium acetate  667 mg Oral BID WC  . carvedilol  6.25 mg Oral QHS  . Chlorhexidine Gluconate Cloth  6 each Topical Q0600  . colesevelam  3,750 mg Oral Daily  .  famotidine  10 mg Oral QHS  . feeding supplement  1 Container Oral BID BM  . isosorbide dinitrate  40 mg Oral TID  . [START ON 12/25/2014] levofloxacin (LEVAQUIN) IV  500 mg Intravenous Q48H  . multivitamin  1 tablet Oral QHS  . mupirocin ointment  1 application Nasal BID  . predniSONE  5 mg Oral Q breakfast  . senna-docusate  3 tablet Oral BID  . Sirolimus  3 tablet Oral Daily  . sodium chloride  500 mL Intravenous Once  . vancomycin  750 mg Intravenous Q M,W,F-HD  . Warfarin - Pharmacist Dosing Inpatient   Does not apply q1800   Time spent: 25 minutes  Marzetta Board, MD [facility] Pager [phone]. If 7 PM -  7 AM, please contact night-coverage at [email or web address], password [facility] 12/24/2014, 6:40 AM  LOS: 1 day

## 2014-12-24 NOTE — Progress Notes (Signed)
Hgb 6.4 this AM, down from 7.6 yesterday.  Results called to K Shorr N.P. At 0450.  New orders received.  Patient examined and no visible signs of bleeding noted.  Vitals stable at present time and the only complaint involves thigh pain from a prior wound.

## 2014-12-24 NOTE — Progress Notes (Signed)
[facility] Progress Note  Assessment/Plan: 1. Sepsis - source unknown VVS, thinks groin wound looks ok- empirc Vanc, Levofloxacin and aztreonam, cultures pending, CXR neg; still has perm cath- removed if + BC - WBC coming down. 2. ESRD - MWF - no heparin HD - next HD Monday 3. Anemia - Hgb 6.4 acute drop - was 8.2 7/22 7.2 7/27 - check hemocult (neg in June)- one unit scheduled for transfusion today - on Aranesp 200 - last given 7/27 -  4. Secondary hyperparathyroidism - resume hectorol 5. BP/volume - BP better- had been hypotension, small right effusion on CXR +362 on HD Friday - weights here not accurate -  6. Nutrition - on CL/Resource bid/vits 7. Constipation - per primary 8. Hx heart tx, failed renal tx - continue current immunosuprresive tx  9. Hx DVT on coumadin INE 3.8 today 10. Thrombocytopenia - acute drop likely related to infx 11. Severe PAD/Right LE pain- xray neg tib/fib for fx- pain may be related to PAD 12. DNR  Myriam Jacobson, PA-C [facility] Beeper [phone] 12/24/2014,10:03 AM  LOS: 1 day   Pt seen, examined and agree w A/P as above.  Kelly Splinter MD pager [phone]    cell [phone] 12/24/2014, 11:17 AM    Subjective:   Feels bad but not as bad as yesterday. Right arm and right lower leg hurt.Feels cold  Objective Filed Vitals:   12/24/14 0200 12/24/14 0300 12/24/14 0538 12/24/14 0800  BP: 106/36 119/48  113/46  Pulse: 90  83 82  Temp:   97.3 F (36.3 C) 97.5 F (36.4 C)  TempSrc:   Oral Oral  Resp: 19 17 17 29   Height:      Weight:      SpO2:   100% 100%   Physical Exam General: shivering, ill appearing, alert Heart: RRR no rub Lungs: no rales  Abdomen: soft NT Extremities: no edema/muscle wasting; right LE sore; bilateral UE edema R>>L chronic - maybe a little worse due to lying on right side Dialysis Access: left IJ amd right fem AVGG + bruit - wound not examined  Dialysis Orders: Center: [facility] on MWF . EDW  68 kg HD Bath 2.0 K 2.0 Ca Time 4 hours  No Heparin. Access LIJ per cath BFR 400 DFR Autoflow 1.5 Linear sodium  Aranesp 200 mcg IV (last dose 12/21/2014) Hgb 8.2 7/22 and 7.2 7/27 Hectoral 3 mcg IV MWF (Last dose 12/21/2014)  Additional Objective Labs: Lab Results  Component Value Date   INR 3.82* 12/24/2014   INR 2.70* 12/23/2014   INR 2.39* 12/22/2014     Basic Metabolic Panel:  Recent Labs Lab 12/22/14 0432 12/23/14 0335  NA 137 136  K 3.3* 4.4  CL 97* 101  CO2 29 23  GLUCOSE 102* 76  BUN 11 24*  CREATININE 3.84* 6.24*  CALCIUM 8.9 8.7*   Liver Function Tests:  Recent Labs Lab 12/22/14 0432  AST 28  ALT 12*  ALKPHOS 66  BILITOT 1.1  PROT 5.6*  ALBUMIN 2.9*   CBC:  Recent Labs Lab 12/22/14 0432 12/23/14 0335 12/24/14 0232  WBC 7.7 8.0 4.8  HGB 7.4* 7.6* 6.4*  HCT 23.1* 23.9* 20.5*  MCV 91.7 91.2 92.8  PLT 115* 76* 65*   Blood Culture    Component Value Date/Time   SDES WOUND RIGHT GROIN 12/23/2014 0824   SPECREQUEST Normal 12/23/2014 0824   CULT  12/23/2014 0824    Culture reincubated for better growth Performed at [facility]    REPTSTATUS PENDING 12/23/2014 9826    Studies/Results: Dg Chest Port 1 View  12/23/2014   CLINICAL DATA:  Sepsis  EXAM: PORTABLE CHEST - 1 VIEW  COMPARISON:  November 11, 2014  FINDINGS: There is a small pleural effusion on the right with a right base atelectasis. Elsewhere lungs are clear. Heart is borderline enlarged with pulmonary vascularity within normal limits. Central catheter tip is at the cavoatrial junction. No pneumothorax. There are stents in each axillary region.  IMPRESSION: Small right pleural effusion with right base atelectasis. Lungs elsewhere clear. Heart prominent but stable. No pneumothorax.   Electronically Signed   By: Lowella Grip III M.D.   On: 12/23/2014 08:40   Medications:   . sodium chloride   Intravenous Once  . atorvastatin  40 mg Oral Daily  . azaTHIOprine  75 mg Oral q  morning - 10a  . aztreonam  1 g Intravenous Q24H  . calcium acetate  667 mg Oral BID WC  . carvedilol  6.25 mg Oral QHS  . Chlorhexidine Gluconate Cloth  6 each Topical Q0600  . colesevelam  3,750 mg Oral Daily  . famotidine  10 mg Oral QHS  . feeding supplement  1 Container Oral BID BM  . isosorbide dinitrate  40 mg Oral TID  . [START ON 12/25/2014] levofloxacin (LEVAQUIN) IV  500 mg Intravenous Q48H  . multivitamin  1 tablet Oral QHS  . mupirocin ointment  1 application Nasal BID  . predniSONE  5 mg Oral Q breakfast  . senna-docusate  3 tablet Oral BID  . Sirolimus  3 tablet Oral Daily  . sodium chloride  500 mL Intravenous Once  . vancomycin  750 mg Intravenous Q M,W,F-HD  . Warfarin - Pharmacist Dosing Inpatient   Does not apply [phone]

## 2014-12-25 DIAGNOSIS — R791 Abnormal coagulation profile: Secondary | ICD-10-CM

## 2014-12-25 LAB — BASIC METABOLIC PANEL
ANION GAP: 7 (ref 5–15)
BUN: 26 mg/dL — ABNORMAL HIGH (ref 6–20)
CALCIUM: 8.8 mg/dL — AB (ref 8.9–10.3)
CO2: 27 mmol/L (ref 22–32)
CREATININE: 5.22 mg/dL — AB (ref 0.61–1.24)
Chloride: 104 mmol/L (ref 101–111)
GFR calc Af Amer: 12 mL/min — ABNORMAL LOW (ref >60)
GFR calc non Af Amer: 10 mL/min — ABNORMAL LOW (ref >60)
Glucose, Bld: 102 mg/dL — ABNORMAL HIGH (ref 65–99)
Potassium: 4.2 mmol/L (ref 3.5–5.1)
Sodium: 138 mmol/L (ref 135–145)

## 2014-12-25 LAB — CBC
HCT: 25.4 % — ABNORMAL LOW (ref 39.0–52.0)
HEMOGLOBIN: 8 g/dL — AB (ref 13.0–17.0)
MCH: 28.9 pg (ref 26.0–34.0)
MCHC: 31.5 g/dL (ref 30.0–36.0)
MCV: 91.7 fL (ref 78.0–100.0)
Platelets: 72 10*3/uL — ABNORMAL LOW (ref 150–400)
RBC: 2.77 MIL/uL — ABNORMAL LOW (ref 4.22–5.81)
RDW: 18.8 % — ABNORMAL HIGH (ref 11.5–15.5)
WBC: 3.4 10*3/uL — ABNORMAL LOW (ref 4.0–10.5)

## 2014-12-25 LAB — PROTIME-INR
INR: 6.02 — AB (ref 0.00–1.49)
PROTHROMBIN TIME: 51.7 s — AB (ref 11.6–15.2)

## 2014-12-25 LAB — OCCULT BLOOD X 1 CARD TO LAB, STOOL: Fecal Occult Bld: NEGATIVE

## 2014-12-25 NOTE — Progress Notes (Signed)
PROGRESS NOTE  Brandon Sharp GLO:[id number] DOB: Jan 23, 1946 DOA: 12/22/2014 PCP: [name], MD  HPI: 69 year old male with ESRD on HD, type 2 diabetes mellitus, HTN, peripheral vascular disease, DVT on chronic anticoagulation, CHF s/p heart transplant presented to the ED for constipation x 1 week. He had been in a SNIF until 7/20 where he was discharge home after being hospitalized for ESRD with symptomatic anemia. He has not passed stool for one week, but has passed flatus. He complains of abdominal pain that was worsening 24 hours prior to admission and described as a cramping. Manual disimpaction and a smog enema were attempted in the ED, which the patient did not tolerate. Patient denies nausea, vomiting, diarrhea, chest pain, shortness of breath, melena, hematochezia, and urinary symptoms.   Subjective / 24 H Interval events - He is feeling much better this morning, he is hungry and wants to eat more - Patient denies any chest pain, shortness of breath - His right lower extremity pain has resolved  Assessment/Plan: Principal Problem:   Sepsis Active Problems:   DVT (deep venous thrombosis)   Heart transplanted   CKD (chronic kidney disease), stage V   ESRD on hemodialysis   Constipation   Fecal impaction  Sepsis of unclear source - sepsis physiology improving, continue broad-spectrum antibiotics with vancomycin and sternum, discontinue levofloxacin today due to elevated INR. His blood cultures have remained negative, he is growing Staphylococcus aureus from his right groin wound. We'll closely follow final cultures - CXR negative for pneumonia, small right pleural effusion  Anemia due to chronic kidney disease / worsening in the setting of sepsis - Patient did require anesthesia unit of packed red blood cells for hemoglobin of 6.4 on 7/28 - This is likely in the setting of sepsis, no bleeding noted, his hemoglobin has improved to 8.0 today, continue to closely monitor  especially with an elevated INR   Thrombocytopenia - has been chronically low, no bleeding - Initially decreased in the setting of probable sepsis, recovering to 72 today  Constipation/Fecal Impaction - Per patient, has been constipated x 1 week - CT abdomen pelvis demonstrated increased stool burden without bowel obstruction  - This has resolved  S/P heart transplant - Transplant 07/10/2009 at [facility].  - On imuran and prednisone - Echo 07/2013: LVEF 55-60%, no wall motion abnormalities  Hx of DVT, on chronic anticoagulation - On warfarin, INR increased to 6 today likely in the setting of antibiotics use, continue to hold per pharmacy. I have discontinued levofloxacin  Hypertension - On carvedilol, continue to monitor   ESRD - History of renal transplant and rejection  - On Hemodialysis M/W/F - nephrology following, plans for hemodialysis tomorrow using the AVG  Type 2 Diabetes Mellitus - Controlled with short acting insulin - CBG has been 75-122, no insulin needed - Monitor CBG  Diet: Diet renal with fluid restriction Fluid restriction:: 1200 mL Fluid; Room service appropriate?: Yes; Fluid consistency:: Thin Fluids: NS DVT Prophylaxis: Heparin  Code Status: DNR Family Communication: Patient Disposition Plan: Remain inpatient, transferred to telemetry tomorrow if continues to be stable  Consultants:  Vascular surgery  Nephrology   Antibiotics   Levofloxacin 12/23/14 >>  Vancomycin 12/23/14 >> 12/25/14  Aztreonam 12/23/14 >>   Studies  Dg Tibia/fibula Right Port  12/24/2014   CLINICAL DATA:  69 year old male with a history of pain in the right tibia with no known injury.  EXAM: PORTABLE RIGHT TIBIA AND FIBULA - 2 VIEW  COMPARISON:  None.  FINDINGS:  No acute fracture line identified. No significant soft tissue swelling. No radiopaque foreign body. Degenerative changes of the ankle and knee.  Extensive vascular calcifications of the femoral popliteal vessels and tibial  vessels.  IMPRESSION: No acute fracture identified.  Extensive vascular calcifications compatible with femoral popliteal disease and infrapopliteal disease. If the patient has not yet been evaluated for claudication/critical limb ischemia as a source of resting pain/leg pain, noninvasive testing with ABI, segmental duplex, and segmental pulse volume recording may be considered as well as office based evaluation.  Signed,  Dulcy Fanny. Earleen Newport, DO  [facility]  [facility]   Electronically Signed   By: Corrie Mckusick D.O.   On: 12/24/2014 10:53   Objective  Filed Vitals:   12/25/14 0600 12/25/14 0746 12/25/14 0800 12/25/14 1000  BP: 107/49 113/47 117/43 120/50  Pulse: 74 73 76 78  Temp:  97.4 F (36.3 C)    TempSrc:  Oral    Resp: 14 16 0 9  Height:      Weight:      SpO2:  100% 99%     Intake/Output Summary (Last 24 hours) at 12/25/14 1142 Last data filed at 12/25/14 0939  Gross per 24 hour  Intake    742 ml  Output      0 ml  Net    742 ml   Filed Weights   12/23/14 1315 12/23/14 1653  Weight: 76.7 kg (169 lb 1.5 oz) 77 kg (169 lb 12.1 oz)   Exam:  General:  Patient laying in bed in no acute distress.  HEENT: no scleral icterus  Cardiovascular: RRR without MRG, 2+ peripheral pulses, no edema  Respiratory: CTA biL, good air movement, no wheezing, no crackles, no rales  Abdomen: soft, non tender, BS +, no guarding  MSK/Extremities: no clubbing/cyanosis, no joint swelling, right thigh wound without drainage, dressing clean dry and intact.  Skin: no rashes  Neuro: non focal  Data Reviewed: Basic Metabolic Panel:  Recent Labs Lab 12/22/14 0432 12/23/14 0335 12/25/14 0331  NA 137 136 138  K 3.3* 4.4 4.2  CL 97* 101 104  CO2 29 23 27   GLUCOSE 102* 76 102*  BUN 11 24* 26*  CREATININE 3.84* 6.24* 5.22*  CALCIUM 8.9 8.7* 8.8*   Liver Function Tests:  Recent Labs Lab 12/22/14 0432  AST 28  ALT 12*  ALKPHOS 66    BILITOT 1.1  PROT 5.6*  ALBUMIN 2.9*   CBC:  Recent Labs Lab 12/22/14 0432 12/23/14 0335 12/24/14 0232 12/25/14 0331  WBC 7.7 8.0 4.8 3.4*  HGB 7.4* 7.6* 6.4* 8.0*  HCT 23.1* 23.9* 20.5* 25.4*  MCV 91.7 91.2 92.8 91.7  PLT 115* 76* 65* 72*    ProBNP (last 3 results)  Recent Labs  03/27/14 1747 04/12/14 0105  PROBNP 37853.0* 45045.0*    Recent Results (from the past 240 hour(s))  MRSA PCR Screening     Status: None   Collection Time: 12/22/14 12:37 PM  Result Value Ref Range Status   MRSA by PCR NEGATIVE NEGATIVE Final    Comment:        The GeneXpert MRSA Assay (FDA approved for NASAL specimens only), is one component of a comprehensive MRSA colonization surveillance program. It is not intended to diagnose MRSA infection nor to guide or monitor treatment for MRSA infections.   Wound culture     Status: None (Preliminary result)   Collection Time: 12/23/14  8:24 AM  Result Value Ref Range  Status   Specimen Description WOUND RIGHT GROIN  Final   Special Requests Normal  Final   Gram Stain PENDING  Incomplete   Culture   Final    MODERATE STAPHYLOCOCCUS AUREUS Note: RIFAMPIN AND GENTAMICIN SHOULD NOT BE USED AS SINGLE DRUGS FOR TREATMENT OF STAPH INFECTIONS. Performed at [facility]    Report Status PENDING  Incomplete  Culture, blood (x 2)     Status: None (Preliminary result)   Collection Time: 12/23/14 10:10 AM  Result Value Ref Range Status   Specimen Description BLOOD LEFT HAND  Final   Special Requests IN PEDIATRIC BOTTLE 1CC  Final   Culture NO GROWTH 1 DAY  Final   Report Status PENDING  Incomplete  Culture, blood (x 2)     Status: None (Preliminary result)   Collection Time: 12/23/14 10:50 AM  Result Value Ref Range Status   Specimen Description BLOOD LEFT HAND  Final   Special Requests IN PEDIATRIC BOTTLE 3CC  Final   Culture NO GROWTH 1 DAY  Final   Report Status PENDING  Incomplete    Scheduled Meds: . atorvastatin  40 mg  Oral Daily  . azaTHIOprine  75 mg Oral q morning - 10a  . aztreonam  1 g Intravenous Q24H  . calcium acetate  667 mg Oral BID WC  . carvedilol  6.25 mg Oral QHS  . Chlorhexidine Gluconate Cloth  6 each Topical Q0600  . colesevelam  3,750 mg Oral Daily  . [START ON 12/28/2014] darbepoetin (ARANESP) injection - DIALYSIS  200 mcg Intravenous Q Wed-HD  . [START ON 12/26/2014] doxercalciferol  3 mcg Intravenous Q M,W,F-HD  . famotidine  10 mg Oral QHS  . feeding supplement  1 Container Oral BID BM  . isosorbide dinitrate  40 mg Oral TID  . multivitamin  1 tablet Oral QHS  . mupirocin ointment  1 application Nasal BID  . predniSONE  5 mg Oral Q breakfast  . senna-docusate  3 tablet Oral BID  . Sirolimus  3 tablet Oral Daily  . sodium chloride  500 mL Intravenous Once  . vancomycin  750 mg Intravenous Q M,W,F-HD  . Warfarin - Pharmacist Dosing Inpatient   Does not apply q1800   Time spent: 25 minutes,   Marzetta Board, MD [facility] Pager [phone]. If 7 PM - 7 AM, please contact night-coverage at [email or web address], password [email or web address] 12/25/2014, 11:42 AM  LOS: 2 days

## 2014-12-25 NOTE — Progress Notes (Signed)
ANTICOAGULATION CONSULT NOTE - Follow Up Consult  Pharmacy Consult for Coumadin & Vancomycin/Aztreonam/Levaquin Indication: Hx of DVT & Sepsis  Allergies  Allergen Reactions  . Lisinopril Swelling    Lips and tongue swell  . Niacin And Related Other (See Comments)    unknown  . Norvasc [Amlodipine Besylate] Rash    Flushing  . Penicillins Rash    Vital Signs: Temp: 97.4 F (36.3 C) (07/31 0746) Temp Source: Oral (07/31 0746) BP: 113/47 mmHg (07/31 0746) Pulse Rate: 73 (07/31 0746)  Labs:  Recent Labs  12/23/14 0335 12/23/14 1050 12/24/14 0232 12/25/14 0331  HGB 7.6*  --  6.4* 8.0*  HCT 23.9*  --  20.5* 25.4*  PLT 76*  --  65* 72*  APTT  --  39*  --   --   LABPROT 28.2*  --  36.7* 51.7*  INR 2.70*  --  3.82* 6.02*  CREATININE 6.24*  --   --  5.22*   Estimated Creatinine Clearance: 14.5 mL/min (by C-G formula based on Cr of 5.22).  Assessment: 69 yo M presents on 7/28 with abdominal pain. PMH includes DM, ESRD, HTN, hx of DVT, CHF s/p heart transplant.  Pharmacy consulted to dose warfarin and antibiotics  Anticoagulation: hx of DVT. PTA warfarin 5mg  daily, admit INR 2.39.  INR now supertherapeutic after starting broad spectrum antibiotics for sepsis.  H/H trending down significantly and platelets down but stable.  No bleeding noted.   Infectious Disease: Day #3 of abx for sepsis. Patient spiked fever 7/29 tmax of 102.7,  now afeb. WBC  wnl. MRSA PCR negative.; plan to pull HD catheter and culture tip, but remains currently Azt 7/29>> LQ 7/29>> Vanc 7/29>> 7/29 wound 7/29 blood x 2  Goal of Therapy:  INR 2-3 Monitor platelets by anticoagulation protocol: Yes   Plan:  No warfarin today Monitor daily INR, CBC, s/s of bleed Continue aztreonam vancomycin 750mg  IV QHD-MWF, levofloxacin  Monitor clinical picture, cultures, HD sessions, VT at Css and antibiotic deescalation / LOT F/U need for Levaquin? F/U family bringing in patient sirolimus  Thank you for  allowing pharmacy to be a part of this patients care team.  Rowe Robert Pharm.D., BCPS, AQ-Cardiology Clinical Pharmacist 12/25/2014 8:32 AM Pager: [phone] Phone: [phone]

## 2014-12-25 NOTE — Progress Notes (Signed)
[facility] Progress Note  Assessment/Plan: 1. Sepsis - source unknown VVS, thinks groin wound looks ok- empirc Vanc, Levofloxacin and aztreonam, no + wound or BC - still pending, CXR neg;  Afebrile, TDC not removed yet due to emergency yesterday - favor delaying another day until we confirm Brandon Sharp is functioning well tomorrow; if he spikes a temp today could remove sooner.Also INR is 6!! Today. Have discussed this plan with VVS PA 2. ESRD - MWF - no heparin HD - next HD Monday 3. Anemia - Hgb 6.4 acute drop - was 8.2 7/22 7.2 7/27 -heme neg; n Aranesp 200 - last given 7/27 - transfused one unit Hgb now up to 8  4. Secondary hyperparathyroidism - continue hectorol 5. BP/volume - BP better- had been hypotensive, small right effusion on CXR +362 on HD Friday - weights here not accurate - try to get standing weights Monday 6. Nutrition - on CL/Resource bid/vits- hopefully advance diet today 7. Constipation - per primary 8. Hx heart tx, failed renal tx - continue current immunosuprresive tx  9. Hx DVT on coumadin INR jumped from 3.8 to 6 10. Thrombocytopenia - acute drop likely related to infx up to 72 11. Severe PAD/Right LE pain- xray neg tib/fib for fx- pain may be related to PAD- no pain today 12. DNR  Myriam Jacobson, PA-C [place] [phone] 12/25/2014,8:32 AM  LOS: 2 days   Pt seen, examined and agree w A/P as above. Feeling better. Blood cx's negative so HD cath not likely source of fevers.  Wound cx +staph aureus.  On abx with resolution of fevers and WBC down.  Plan HD tomorrow, use AVG again and if it works well and when INR is down the tunneled cath can be removed this week.  Kelly Splinter MD pager [phone]    cell [phone] 12/25/2014, 10:45 AM    Subjective:   Feels better, still cold. No pain anywhere. Taking liquids.  Objective Filed Vitals:   12/25/14 0200 12/25/14 0400 12/25/14 0600 12/25/14 0746  BP: 97/43 109/45 107/49 113/47   Pulse: 76 73 74 73  Temp:  97.4 F (36.3 C)  97.4 F (36.3 C)  TempSrc:  Oral  Oral  Resp: 19 18 14 16   Height:      Weight:      SpO2:  99%  100%   Physical Exam General: slightly tremulous, NAD Heart: RRR 2/6 murmur Lungs: no rales Abdomen: soft NT Extremities: no edema of LE; bilateral UE edema R>L Dialysis Access: right thigh AVGG and left IJ  Dialysis Orders: Center: [facility] on MWF . EDW 68 kg HD Bath 2.0 K 2.0 Ca Time 4 hours No Heparin. Access LIJ per cath BFR 400 DFR Autoflow 1.5 Linear sodium  Aranesp 200 mcg IV (last dose 12/21/2014) Hgb 8.2 7/22 and 7.2 7/27 Hectoral 3 mcg IV MWF (Last dose 12/21/2014)  Additional Objective Labs: Basic Metabolic Panel:  Recent Labs Lab 12/22/14 0432 12/23/14 0335 12/25/14 0331  NA 137 136 138  K 3.3* 4.4 4.2  CL 97* 101 104  CO2 29 23 27   GLUCOSE 102* 76 102*  BUN 11 24* 26*  CREATININE 3.84* 6.24* 5.22*  CALCIUM 8.9 8.7* 8.8*   Liver Function Tests:  Recent Labs Lab 12/22/14 0432  AST 28  ALT 12*  ALKPHOS 66  BILITOT 1.1  PROT 5.6*  ALBUMIN 2.9*   CBC:  Recent Labs Lab 12/22/14 0432 12/23/14 0335 12/24/14 0232 12/25/14 0331  WBC 7.7 8.0  4.8 3.4*  HGB 7.4* 7.6* 6.4* 8.0*  HCT 23.1* 23.9* 20.5* 25.4*  MCV 91.7 91.2 92.8 91.7  PLT 115* 76* 65* 72*   Blood Culture    Component Value Date/Time   SDES BLOOD LEFT HAND 12/23/2014 1050   SPECREQUEST IN PEDIATRIC BOTTLE 3CC 12/23/2014 1050   CULT NO GROWTH 1 DAY 12/23/2014 1050   REPTSTATUS PENDING 12/23/2014 1050   Lab Results  Component Value Date   INR 6.02* 12/25/2014   INR 3.82* 12/24/2014   INR 2.70* 12/23/2014    Studies/Results: Dg Chest Port 1 View  12/23/2014   CLINICAL DATA:  Sepsis  EXAM: PORTABLE CHEST - 1 VIEW  COMPARISON:  November 11, 2014  FINDINGS: There is a small pleural effusion on the right with a right base atelectasis. Elsewhere lungs are clear. Heart is borderline enlarged with pulmonary vascularity within normal limits.  Central catheter tip is at the cavoatrial junction. No pneumothorax. There are stents in each axillary region.  IMPRESSION: Small right pleural effusion with right base atelectasis. Lungs elsewhere clear. Heart prominent but stable. No pneumothorax.   Electronically Signed   By: Lowella Grip III M.D.   On: 12/23/2014 08:40   Dg Tibia/fibula Right Port  12/24/2014   CLINICAL DATA:  69 year old male with a history of pain in the right tibia with no known injury.  EXAM: PORTABLE RIGHT TIBIA AND FIBULA - 2 VIEW  COMPARISON:  None.  FINDINGS: No acute fracture line identified. No significant soft tissue swelling. No radiopaque foreign body. Degenerative changes of the ankle and knee.  Extensive vascular calcifications of the femoral popliteal vessels and tibial vessels.  IMPRESSION: No acute fracture identified.  Extensive vascular calcifications compatible with femoral popliteal disease and infrapopliteal disease. If the patient has not yet been evaluated for claudication/critical limb ischemia as a source of resting pain/leg pain, noninvasive testing with ABI, segmental duplex, and segmental pulse volume recording may be considered as well as office based evaluation.  Signed,  Dulcy Fanny. Earleen Newport, DO  [facility]  [facility]   Electronically Signed   By: Corrie Mckusick D.O.   On: 12/24/2014 10:53   Medications:   . atorvastatin  40 mg Oral Daily  . azaTHIOprine  75 mg Oral q morning - 10a  . aztreonam  1 g Intravenous Q24H  . calcium acetate  667 mg Oral BID WC  . carvedilol  6.25 mg Oral QHS  . Chlorhexidine Gluconate Cloth  6 each Topical Q0600  . colesevelam  3,750 mg Oral Daily  . [START ON 12/28/2014] darbepoetin (ARANESP) injection - DIALYSIS  200 mcg Intravenous Q Wed-HD  . [START ON 12/26/2014] doxercalciferol  3 mcg Intravenous Q M,W,F-HD  . famotidine  10 mg Oral QHS  . feeding supplement  1 Container Oral BID BM  . isosorbide dinitrate  40 mg Oral  TID  . levofloxacin (LEVAQUIN) IV  500 mg Intravenous Q48H  . multivitamin  1 tablet Oral QHS  . mupirocin ointment  1 application Nasal BID  . predniSONE  5 mg Oral Q breakfast  . senna-docusate  3 tablet Oral BID  . Sirolimus  3 tablet Oral Daily  . sodium chloride  500 mL Intravenous Once  . vancomycin  750 mg Intravenous Q M,W,F-HD  . Warfarin - Pharmacist Dosing Inpatient   Does not apply [phone]

## 2014-12-26 LAB — RENAL FUNCTION PANEL
Albumin: 2.4 g/dL — ABNORMAL LOW (ref 3.5–5.0)
Anion gap: 8 (ref 5–15)
BUN: 38 mg/dL — ABNORMAL HIGH (ref 6–20)
CHLORIDE: 104 mmol/L (ref 101–111)
CO2: 25 mmol/L (ref 22–32)
Calcium: 8.9 mg/dL (ref 8.9–10.3)
Creatinine, Ser: 6.5 mg/dL — ABNORMAL HIGH (ref 0.61–1.24)
GFR calc non Af Amer: 8 mL/min — ABNORMAL LOW (ref >60)
GFR, EST AFRICAN AMERICAN: 9 mL/min — AB (ref >60)
GLUCOSE: 86 mg/dL (ref 65–99)
PHOSPHORUS: 2.7 mg/dL (ref 2.5–4.6)
Potassium: 4.1 mmol/L (ref 3.5–5.1)
Sodium: 137 mmol/L (ref 135–145)

## 2014-12-26 LAB — CBC
HCT: 25.5 % — ABNORMAL LOW (ref 39.0–52.0)
HCT: 23.1 % — ABNORMAL LOW (ref 39.0–52.0)
Hemoglobin: 8.2 g/dL — ABNORMAL LOW (ref 13.0–17.0)
Hemoglobin: 7.8 g/dL — ABNORMAL LOW (ref 13.0–17.0)
MCH: 29.6 pg (ref 26.0–34.0)
MCH: 29.8 pg (ref 26.0–34.0)
MCHC: 32.2 g/dL (ref 30.0–36.0)
MCHC: 33.8 g/dL (ref 30.0–36.0)
MCV: 92.1 fL (ref 78.0–100.0)
MCV: 88.2 fL (ref 78.0–100.0)
Platelets: 78 10*3/uL — ABNORMAL LOW (ref 150–400)
Platelets: 53 10*3/uL — ABNORMAL LOW (ref 150–400)
RBC: 2.77 MIL/uL — ABNORMAL LOW (ref 4.22–5.81)
RBC: 2.62 MIL/uL — ABNORMAL LOW (ref 4.22–5.81)
RDW: 18.9 % — ABNORMAL HIGH (ref 11.5–15.5)
RDW: 18.4 % — ABNORMAL HIGH (ref 11.5–15.5)
WBC: 2.9 10*3/uL — ABNORMAL LOW (ref 4.0–10.5)
WBC: 2.6 10*3/uL — ABNORMAL LOW (ref 4.0–10.5)

## 2014-12-26 LAB — WOUND CULTURE: Special Requests: NORMAL

## 2014-12-26 LAB — PROTIME-INR
INR: 6.78 (ref 0.00–1.49)
Prothrombin Time: 56.5 seconds — ABNORMAL HIGH (ref 11.6–15.2)

## 2014-12-26 MED ORDER — VITAMIN K1 10 MG/ML IJ SOLN
2.5000 mg | Freq: Once | INTRAVENOUS | Status: AC
  Administered 2014-12-26: 2.5 mg via INTRAVENOUS
  Filled 2014-12-26: qty 0.25

## 2014-12-26 MED ORDER — NEPRO/CARBSTEADY PO LIQD: 237.0000 mL | ORAL | Status: DC | PRN

## 2014-12-26 MED ORDER — PENTAFLUOROPROP-TETRAFLUOROETH EX AERO: 1.0000 "application " | INHALATION_SPRAY | CUTANEOUS | Status: DC | PRN

## 2014-12-26 MED ORDER — DOXERCALCIFEROL 4 MCG/2ML IV SOLN
INTRAVENOUS | Status: AC
  Administered 2014-12-26: 3 ug via INTRAVENOUS
  Filled 2014-12-26: qty 2

## 2014-12-26 MED ORDER — ALTEPLASE 2 MG IJ SOLR
2.0000 mg | Freq: Once | INTRAMUSCULAR | Status: AC | PRN
  Filled 2014-12-26: qty 2

## 2014-12-26 MED ORDER — HEPARIN SODIUM (PORCINE) 1000 UNIT/ML DIALYSIS: 1000.0000 [IU] | INTRAMUSCULAR | Status: DC | PRN

## 2014-12-26 MED ORDER — SODIUM CHLORIDE 0.9 % IV SOLN: 100.0000 mL | INTRAVENOUS | Status: DC | PRN

## 2014-12-26 MED ORDER — LIDOCAINE-PRILOCAINE 2.5-2.5 % EX CREA
1.0000 "application " | TOPICAL_CREAM | CUTANEOUS | Status: DC | PRN
  Filled 2014-12-26: qty 5

## 2014-12-26 MED ORDER — LIDOCAINE HCL (PF) 1 % IJ SOLN: 5.0000 mL | INTRAMUSCULAR | Status: DC | PRN

## 2014-12-26 NOTE — Care Management Important Message (Signed)
Important Message  Patient Details  Name: DETRELL UMSCHEID MRN: [id number] Date of Birth: December 05, 1945   Medicare Important Message Given:  Yes-second notification given    Pricilla Handler 12/26/2014, 12:17 PM

## 2014-12-26 NOTE — Progress Notes (Signed)
CRITICAL VALUE ALERT  Critical value received: INR 6.78  Date of notification:  12/26/14  Time of notification: 0430  Critical value read back: yes  Nurse who received alert: M.Forest Gleason, RN  MD notified (1st page):  M.Donnal Debar, NP  Time of first page: [phone]  MD notified (2nd page):  Time of second page:  Responding MD: M. Donnal Debar, NP  Time MD responded:  [phone]  No new orders at this time. Warfarin has been held the past two days. NP aware and will report off to AM MD.   M.Piel, RN

## 2014-12-26 NOTE — Progress Notes (Signed)
Patient arrived on unit via bed from [facility].  Telemetry placed per MD order. CMT notified.

## 2014-12-26 NOTE — Care Management Important Message (Signed)
Important Message  Patient Details  Name: Brandon Sharp MRN: [id number] Date of Birth: 03/24/1946   Medicare Important Message Given:  [facility] notification given    Nathen May 12/26/2014, 11:52 AMImportant Message  Patient Details  Name: Brandon Sharp MRN: [id number] Date of Birth: 07-Aug-1945   Medicare Important Message Given:  Yes-second notification given    Nathen May 12/26/2014, 11:52 AM

## 2014-12-26 NOTE — Progress Notes (Signed)
ANTICOAGULATION CONSULT NOTE - Follow Up Consult  Pharmacy Consult for Coumadin & Vancomycin/Aztreonam Indication: Hx of DVT & Sepsis  Allergies  Allergen Reactions  . Lisinopril Swelling    Lips and tongue swell  . Niacin And Related Other (See Comments)    unknown  . Norvasc [Amlodipine Besylate] Rash    Flushing  . Penicillins Rash    Vital Signs: Temp: 97.9 F (36.6 C) (08/01 0824) Temp Source: Oral (08/01 0824) BP: 101/37 mmHg (08/01 0824) Pulse Rate: 77 (08/01 0600)  Labs:  Recent Labs  12/23/14 1050  12/24/14 0232 12/25/14 0331 12/26/14 0250  HGB  --   < > 6.4* 8.0* 8.2*  HCT  --   --  20.5* 25.4* 25.5*  PLT  --   --  65* 72* 78*  APTT 39*  --   --   --   --   LABPROT  --   --  36.7* 51.7* 56.5*  INR  --   --  3.82* 6.02* 6.78*  CREATININE  --   --   --  5.22* 6.50*  < > = values in this interval not displayed. Estimated Creatinine Clearance: 11.7 mL/min (by C-G formula based on Cr of 6.5).  Assessment: 69 yo M presents on 7/28 with abdominal pain. PMH includes DM, ESRD, HTN, hx of DVT, CHF s/p heart transplant.  Pharmacy consulted to dose warfarin and antibiotics  Anticoagulation: hx of DVT. PTA warfarin 5mg  daily, admit INR 2.39.  INR now supertherapeutic after starting broad spectrum antibiotics for sepsis.  H/H trending down significantly and platelets down but stable.  No bleeding noted.   Infectious Disease: Day #4 of abx for sepsis. Patient spiked fever 7/29 tmax of 102.7,  now afeb. WBC  wnl. MRSA PCR negative.; plan to pull HD catheter and culture tip, but remains currently Azt 7/29>> LQ 7/29>>7/31 Vanc 7/29>> 7/29 wound> Staph aureus 7/29 blood x 2> NGTD  Goal of Therapy:  INR 2-3 Monitor platelets by anticoagulation protocol: Yes   Plan:  No warfarin today Monitor daily INR, CBC, s/s of bleed Continue aztreonam vancomycin 750mg  IV QHD-MWF, Monitor clinical picture, cultures, HD sessions, VT at [facility] and antibiotic deescalation / LOT F/U  family bringing in patient sirolimus  Consider stopping aztreonam with staph aureus in wound culture?  Thank you. Anette Guarneri, PharmD [phone]  12/26/2014 8:55 AM

## 2014-12-26 NOTE — Progress Notes (Signed)
PROGRESS NOTE  Brandon Sharp NTZ:[id number] DOB: 1946-05-12 DOA: 12/22/2014 PCP: [name], MD  HPI: 69 year old male with ESRD on HD, type 2 diabetes mellitus, HTN, peripheral vascular disease, DVT on chronic anticoagulation, CHF s/p heart transplant presented to the ED for constipation x 1 week. He had been in a SNIF until 7/20 where he was discharge home after being hospitalized for ESRD with symptomatic anemia. He has not passed stool for one week, but has passed flatus. He complains of abdominal pain that was worsening 24 hours prior to admission and described as a cramping. Manual disimpaction and a smog enema were attempted in the ED, which the patient did not tolerate. Patient denies nausea, vomiting, diarrhea, chest pain, shortness of breath, melena, hematochezia, and urinary symptoms.   Subjective / 24 H Interval events - no chest pain, shortness of breath, no abdominal pain, nausea or vomiting.  - slept well  Assessment/Plan: Principal Problem:   Sepsis Active Problems:   DVT (deep venous thrombosis)   Heart transplanted   CKD (chronic kidney disease), stage V   ESRD on hemodialysis   Constipation   Fecal impaction  Sepsis of unclear source - sepsis physiology improving, continue broad-spectrum antibiotics with vancomycin and aztreonam, discontinued levofloxacin 7/31 due to elevated INR. His blood cultures have remained negative, he is growing Staphylococcus aureus from his right groin wound, still pending. We'll closely follow final cultures - CXR negative for pneumonia, small right pleural effusion  Anemia due to chronic kidney disease / worsening in the setting of sepsis - Patient did require anesthesia unit of packed red blood cells for hemoglobin of 6.4 on 7/28 - This is likely in the setting of sepsis, no bleeding noted, his hemoglobin has improved after the blood transfusion and has remained stable since  Thrombocytopenia - has been chronically low, no  bleeding - Initially decreased in the setting of probable sepsis, recovering to 78 today  Constipation/Fecal Impaction - Per patient, has been constipated x 1 week - CT abdomen pelvis demonstrated increased stool burden without bowel obstruction  - This has resolved  S/P heart transplant - Transplant 07/10/2009 at [facility].  - On imuran and prednisone - Echo 07/2013: LVEF 55-60%, no wall motion abnormalities  Hx of DVT, on chronic anticoagulation - On warfarin, INR increased likely in the setting of antibiotics use, continue to hold per pharmacy. I have discontinued levofloxacin on 7/31 - INR continues to climb up today, provide low-dose 2.5 mg of vitamin K  Hypertension - On carvedilol, continue to monitor   ESRD - History of renal transplant and rejection  - On Hemodialysis M/W/F - nephrology following, plans for hemodialysis today using the AVG  Type 2 Diabetes Mellitus - Controlled with short acting insulin - CBG has been 75-122, no insulin needed - Monitor CBG   Diet: Diet renal with fluid restriction Fluid restriction:: 1200 mL Fluid; Room service appropriate?: Yes; Fluid consistency:: Thin Fluids: NS DVT Prophylaxis: Heparin  Code Status: DNR Family Communication: Patient Disposition Plan: Remain inpatient, transferred to telemetry today  Consultants:  Vascular surgery  Nephrology   Antibiotics   Levofloxacin 12/23/14 >>  Vancomycin 12/23/14 >> 12/25/14  Aztreonam 12/23/14 >>   Studies  No results found. Objective  Filed Vitals:   12/26/14 0000 12/26/14 0400 12/26/14 0600 12/26/14 0824  BP: 104/71 104/44 101/46 101/37  Pulse: 82 79 77   Temp:  98.3 F (36.8 C)  97.9 F (36.6 C)  TempSrc:  Oral  Oral  Resp: 20 19  20 12  Height:      Weight:      SpO2:  99%  99%    Intake/Output Summary (Last 24 hours) at 12/26/14 1116 Last data filed at 12/26/14 0900  Gross per 24 hour  Intake    480 ml  Output      0 ml  Net    480 ml   Filed Weights    12/23/14 1315 12/23/14 1653  Weight: 76.7 kg (169 lb 1.5 oz) 77 kg (169 lb 12.1 oz)   Exam:  General:  Patient laying in bed in no acute distress.  HEENT: no scleral icterus  Cardiovascular: RRR without MRG, 2+ peripheral pulses, no edema  Respiratory: CTA biL, good air movement, no wheezing, no crackles, no rales  Abdomen: soft, non tender, BS +, no guarding  MSK/Extremities: no clubbing/cyanosis, no joint swelling, right thigh wound without drainage, dressing clean dry and intact.  Skin: no rashes  Neuro: non focal  Data Reviewed: Basic Metabolic Panel:  Recent Labs Lab 12/22/14 0432 12/23/14 0335 12/25/14 0331 12/26/14 0250  NA 137 136 138 137  K 3.3* 4.4 4.2 4.1  CL 97* 101 104 104  CO2 29 23 27 25   GLUCOSE 102* 76 102* 86  BUN 11 24* 26* 38*  CREATININE 3.84* 6.24* 5.22* 6.50*  CALCIUM 8.9 8.7* 8.8* 8.9  PHOS  --   --   --  2.7   Liver Function Tests:  Recent Labs Lab 12/22/14 0432 12/26/14 0250  AST 28  --   ALT 12*  --   ALKPHOS 66  --   BILITOT 1.1  --   PROT 5.6*  --   ALBUMIN 2.9* 2.4*   CBC:  Recent Labs Lab 12/22/14 0432 12/23/14 0335 12/24/14 0232 12/25/14 0331 12/26/14 0250  WBC 7.7 8.0 4.8 3.4* 2.9*  HGB 7.4* 7.6* 6.4* 8.0* 8.2*  HCT 23.1* 23.9* 20.5* 25.4* 25.5*  MCV 91.7 91.2 92.8 91.7 92.1  PLT 115* 76* 65* 72* 78*    ProBNP (last 3 results)  Recent Labs  03/27/14 1747 04/12/14 0105  PROBNP 37853.0* 45045.0*    Recent Results (from the past 240 hour(s))  MRSA PCR Screening     Status: None   Collection Time: 12/22/14 12:37 PM  Result Value Ref Range Status   MRSA by PCR NEGATIVE NEGATIVE Final    Comment:        The GeneXpert MRSA Assay (FDA approved for NASAL specimens only), is one component of a comprehensive MRSA colonization surveillance program. It is not intended to diagnose MRSA infection nor to guide or monitor treatment for MRSA infections.   Wound culture     Status: None (Preliminary result)    Collection Time: 12/23/14  8:24 AM  Result Value Ref Range Status   Specimen Description WOUND RIGHT GROIN  Final   Special Requests Normal  Final   Gram Stain PENDING  Incomplete   Culture   Final    MODERATE STAPHYLOCOCCUS AUREUS Note: RIFAMPIN AND GENTAMICIN SHOULD NOT BE USED AS SINGLE DRUGS FOR TREATMENT OF STAPH INFECTIONS. Performed at [facility]    Report Status PENDING  Incomplete  Culture, blood (x 2)     Status: None (Preliminary result)   Collection Time: 12/23/14 10:10 AM  Result Value Ref Range Status   Specimen Description BLOOD LEFT HAND  Final   Special Requests IN PEDIATRIC BOTTLE 1CC  Final   Culture NO GROWTH 2 DAYS  Final   Report Status  PENDING  Incomplete  Culture, blood (x 2)     Status: None (Preliminary result)   Collection Time: 12/23/14 10:50 AM  Result Value Ref Range Status   Specimen Description BLOOD LEFT HAND  Final   Special Requests IN PEDIATRIC BOTTLE 3CC  Final   Culture NO GROWTH 2 DAYS  Final   Report Status PENDING  Incomplete    Scheduled Meds: . atorvastatin  40 mg Oral Daily  . azaTHIOprine  75 mg Oral q morning - 10a  . aztreonam  1 g Intravenous Q24H  . calcium acetate  667 mg Oral BID WC  . carvedilol  6.25 mg Oral QHS  . colesevelam  3,750 mg Oral Daily  . [START ON 12/28/2014] darbepoetin (ARANESP) injection - DIALYSIS  200 mcg Intravenous Q Wed-HD  . doxercalciferol  3 mcg Intravenous Q M,W,F-HD  . famotidine  10 mg Oral QHS  . feeding supplement  1 Container Oral BID BM  . isosorbide dinitrate  40 mg Oral TID  . multivitamin  1 tablet Oral QHS  . mupirocin ointment  1 application Nasal BID  . predniSONE  5 mg Oral Q breakfast  . senna-docusate  3 tablet Oral BID  . Sirolimus  3 tablet Oral Daily  . sodium chloride  500 mL Intravenous Once  . vancomycin  750 mg Intravenous Q M,W,F-HD  . Warfarin - Pharmacist Dosing Inpatient   Does not apply q1800   Time spent: 25 minutes  Marzetta Board, MD [facility] Pager [phone]. If 7 PM - 7 AM, please contact night-coverage at [email or web address], password [facility] 12/26/2014, 11:16 AM  LOS: 3 days

## 2014-12-26 NOTE — Progress Notes (Signed)
Patient ID: Brandon Sharp, male   DOB: Nov 04, 1945, 69 y.o.   MRN: [id number]   [facility] Progress Note    Subjective:   Feels well no complaints   Objective:   BP 101/37 mmHg  Pulse 77  Temp(Src) 97.9 F (36.6 C) (Oral)  Resp 12  Ht 6' (1.829 m)  Wt 77 kg (169 lb 12.1 oz)  BMI 23.02 kg/m2  SpO2 99%  Intake/Output: I/O last 3 completed shifts: In: 650 [P.O.:600; IV Piggyback:50] Out: -    Intake/Output this shift:  Total I/O In: 240 [P.O.:240] Out: -  Weight change:   Physical Exam: Gen:WD WN AAM in NAD CVS: no rub Resp:cta BWL:SLHTDS KAJ:GOTLX thigh AVG +T/B, +edema of upper extremities bilaterally  Labs: BMET  Recent Labs Lab 12/22/14 0432 12/23/14 0335 12/25/14 0331 12/26/14 0250  NA 137 136 138 137  K 3.3* 4.4 4.2 4.1  CL 97* 101 104 104  CO2 29 23 27 25   GLUCOSE 102* 76 102* 86  BUN 11 24* 26* 38*  CREATININE 3.84* 6.24* 5.22* 6.50*  ALBUMIN 2.9*  --   --  2.4*  CALCIUM 8.9 8.7* 8.8* 8.9  PHOS  --   --   --  2.7   CBC  Recent Labs Lab 12/23/14 0335 12/24/14 0232 12/25/14 0331 12/26/14 0250  WBC 8.0 4.8 3.4* 2.9*  HGB 7.6* 6.4* 8.0* 8.2*  HCT 23.9* 20.5* 25.4* 25.5*  MCV 91.2 92.8 91.7 92.1  PLT 76* 65* 72* 78*    @IMGRELPRIORS @ Medications:    . atorvastatin  40 mg Oral Daily  . azaTHIOprine  75 mg Oral q morning - 10a  . aztreonam  1 g Intravenous Q24H  . calcium acetate  667 mg Oral BID WC  . carvedilol  6.25 mg Oral QHS  . colesevelam  3,750 mg Oral Daily  . [START ON 12/28/2014] darbepoetin (ARANESP) injection - DIALYSIS  200 mcg Intravenous Q Wed-HD  . doxercalciferol  3 mcg Intravenous Q M,W,F-HD  . famotidine  10 mg Oral QHS  . feeding supplement  1 Container Oral BID BM  . isosorbide dinitrate  40 mg Oral TID  . multivitamin  1 tablet Oral QHS  . mupirocin ointment  1 application Nasal BID  . phytonadione (VITAMIN K) IV  2.5 mg Intravenous Once  . predniSONE  5 mg Oral Q breakfast  . senna-docusate   3 tablet Oral BID  . Sirolimus  3 tablet Oral Daily  . sodium chloride  500 mL Intravenous Once  . vancomycin  750 mg Intravenous Q M,W,F-HD  . Warfarin - Pharmacist Dosing Inpatient   Does not apply [id number]     Assessment/ Plan:   1. SIRS- unclear etiology, cultures negative, cont with empiric abx for now but may be able to stop as this could be related to ABLA and hematoma as he does not appear toxic 2. ESRD- cont with hd qMWF without heparin 3. Anemia of chronic disease: complicated by ABLA.  Cont with Aranesp and transfuse prn 4. CKD-MBD:cont hectoral 5. Nutrition:stable 6. Hypertension:stable 7. DVT- coumadin per primary svc 8. Vascular access- right thigh AVG +T/B to use with HD today.  May be able to d/c [facility] if AVG functioning well 9. S/p heart transplant and failed renal tx- continue with immunosuppressive agents 10. Thrombocytopenia- follow, no heparin. 11. 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  Remove HD cath if AVG is functioning. 12. DNR  Brandon Sharp A 12/26/2014, 11:43 AM

## 2014-12-27 DIAGNOSIS — A4902 Methicillin resistant Staphylococcus aureus infection, unspecified site: Secondary | ICD-10-CM

## 2014-12-27 LAB — RENAL FUNCTION PANEL
Albumin: 2.2 g/dL — ABNORMAL LOW (ref 3.5–5.0)
Albumin: 2.1 g/dL — ABNORMAL LOW (ref 3.5–5.0)
Anion gap: 10 (ref 5–15)
Anion gap: 9 (ref 5–15)
BUN: 12 mg/dL (ref 6–20)
BUN: 13 mg/dL (ref 6–20)
CHLORIDE: 101 mmol/L (ref 101–111)
CHLORIDE: 100 mmol/L — AB (ref 101–111)
CO2: 28 mmol/L (ref 22–32)
CO2: 32 mmol/L (ref 22–32)
CREATININE: 2.9 mg/dL — AB (ref 0.61–1.24)
Calcium: 7.8 mg/dL — ABNORMAL LOW (ref 8.9–10.3)
Calcium: 7.9 mg/dL — ABNORMAL LOW (ref 8.9–10.3)
Creatinine, Ser: 3.32 mg/dL — ABNORMAL HIGH (ref 0.61–1.24)
GFR calc Af Amer: 20 mL/min — ABNORMAL LOW (ref >60)
GFR calc non Af Amer: 21 mL/min — ABNORMAL LOW (ref >60)
GFR calc non Af Amer: 18 mL/min — ABNORMAL LOW (ref >60)
GFR, EST AFRICAN AMERICAN: 24 mL/min — AB (ref >60)
GLUCOSE: 105 mg/dL — AB (ref 65–99)
GLUCOSE: 87 mg/dL (ref 65–99)
Phosphorus: 2 mg/dL — ABNORMAL LOW (ref 2.5–4.6)
Phosphorus: 1.7 mg/dL — ABNORMAL LOW (ref 2.5–4.6)
Potassium: 4.9 mmol/L (ref 3.5–5.1)
Potassium: 3.1 mmol/L — ABNORMAL LOW (ref 3.5–5.1)
SODIUM: 139 mmol/L (ref 135–145)
SODIUM: 141 mmol/L (ref 135–145)

## 2014-12-27 LAB — CBC
HCT: 22.1 % — ABNORMAL LOW (ref 39.0–52.0)
Hemoglobin: 7.1 g/dL — ABNORMAL LOW (ref 13.0–17.0)
MCH: 29.6 pg (ref 26.0–34.0)
MCHC: 32.1 g/dL (ref 30.0–36.0)
MCV: 92.1 fL (ref 78.0–100.0)
PLATELETS: 63 10*3/uL — AB (ref 150–400)
RBC: 2.4 MIL/uL — AB (ref 4.22–5.81)
RDW: 18.5 % — AB (ref 11.5–15.5)
WBC: 2.3 10*3/uL — ABNORMAL LOW (ref 4.0–10.5)

## 2014-12-27 LAB — HEMOGLOBIN AND HEMATOCRIT, BLOOD
HCT: 22 % — ABNORMAL LOW (ref 39.0–52.0)
Hemoglobin: 7 g/dL — ABNORMAL LOW (ref 13.0–17.0)

## 2014-12-27 LAB — PREPARE RBC (CROSSMATCH)

## 2014-12-27 LAB — PROTIME-INR
INR: 1.56 — ABNORMAL HIGH (ref 0.00–1.49)
PROTHROMBIN TIME: 18.7 s — AB (ref 11.6–15.2)

## 2014-12-27 MED ORDER — SIROLIMUS 0.5 MG PO TABS
3.0000 | ORAL_TABLET | Freq: Every day | ORAL | Status: DC
  Administered 2014-12-27 – 2014-12-31 (×5): 1.5 mg via ORAL
  Filled 2014-12-27: qty 3

## 2014-12-27 MED ORDER — WARFARIN SODIUM 3 MG PO TABS
3.0000 mg | ORAL_TABLET | Freq: Once | ORAL | Status: AC
  Administered 2014-12-27: 3 mg via ORAL
  Filled 2014-12-27: qty 1

## 2014-12-27 MED ORDER — SODIUM CHLORIDE 0.9 % IV SOLN: Freq: Once | INTRAVENOUS | Status: DC

## 2014-12-27 NOTE — Progress Notes (Signed)
PROGRESS NOTE  Brandon Sharp ZDG:[id number] DOB: 25-Dec-1945 DOA: 12/22/2014 PCP: [name], MD  HPI: 69 year old male with ESRD on HD, type 2 diabetes mellitus, HTN, peripheral vascular disease, DVT on chronic anticoagulation, CHF s/p heart transplant presented to the ED for constipation x 1 week. He had been in a SNIF until 7/20 where he was discharge home after being hospitalized for ESRD with symptomatic anemia. He has not passed stool for one week, but has passed flatus. He complains of abdominal pain that was worsening 24 hours prior to admission and described as a cramping. Manual disimpaction and a smog enema were attempted in the ED, which the patient did not tolerate. Patient denies nausea, vomiting, diarrhea, chest pain, shortness of breath, melena, hematochezia, and urinary symptoms.   Subjective / 24 H Interval events - no chest pain, shortness of breath, no abdominal pain, nausea or vomiting.  - slept well  Assessment/Plan: Principal Problem:   Sepsis Active Problems:   DVT (deep venous thrombosis)   Heart transplanted   CKD (chronic kidney disease), stage V   ESRD on hemodialysis   Constipation   Fecal impaction  Sepsis of unclear source, possible MRSA skin infection - sepsis physiology improving, continue broad-spectrum antibiotics with vancomycin and aztreonam, discontinued levofloxacin 7/31 due to elevated INR. His blood cultures have remained negative, he is growing MRSA from his right groin wound - narrow to clinda on d/c for total 7 days. - CXR negative for pneumonia, small right pleural effusion  Anemia due to chronic kidney disease / worsening in the setting of sepsis - Patient did require anesthesia unit of packed red blood cells for hemoglobin of 6.4 on 7/28 - This is likely in the setting of sepsis, no bleeding noted, his hemoglobin has improved after the blood transfusion and has remained stable since  Thrombocytopenia / leukopenia - has been  chronically low, no bleeding - check sirolimus level  Constipation/Fecal Impaction - Per patient, has been constipated x 1 week - CT abdomen pelvis demonstrated increased stool burden without bowel obstruction  - This has resolved  S/P heart transplant - Transplant 07/10/2009 at [facility].  - On imuran and prednisone - Echo 07/2013: LVEF 55-60%, no wall motion abnormalities  Hx of DVT, on chronic anticoagulation - continue Coumadin  Hypertension - On carvedilol, continue to monitor   ESRD - History of renal transplant and rejection  - On Hemodialysis M/W/F - nephrology following, HD tomorrow  Type 2 Diabetes Mellitus - Controlled with short acting insulin - CBG has been 75-122, no insulin needed - Monitor CBG   Diet: Diet renal with fluid restriction Fluid restriction:: 1200 mL Fluid; Room service appropriate?: Yes; Fluid consistency:: Thin Fluids: NS DVT Prophylaxis: Heparin  Code Status: DNR Family Communication: Patient Disposition Plan: Remain inpatient, transferred to telemetry today  Consultants:  Vascular surgery  Nephrology   Antibiotics   Levofloxacin 12/23/14 >>  Vancomycin 12/23/14 >> 12/25/14  Aztreonam 12/23/14 >>   Studies  No results found. Objective  Filed Vitals:   12/26/14 1934 12/26/14 2044 12/27/14 0623 12/27/14 0827  BP: 103/50 127/46 106/48 95/65  Pulse: 76 83 80 85  Temp: 96.7 F (35.9 C) 97.5 F (36.4 C) 98.4 F (36.9 C) 97.7 F (36.5 C)  TempSrc: Oral Oral Oral Oral  Resp: 18 17 17 17   Height:      Weight: 72.5 kg (159 lb 13.3 oz)     SpO2: 99% 100% 99% 100%    Intake/Output Summary (Last 24 hours) at  12/27/14 1441 Last data filed at 12/27/14 1256  Gross per 24 hour  Intake    770 ml  Output    934 ml  Net   -164 ml   Filed Weights   12/23/14 1653 12/26/14 1519 12/26/14 1934  Weight: 77 kg (169 lb 12.1 oz) 78.8 kg (173 lb 11.6 oz) 72.5 kg (159 lb 13.3 oz)   Exam:  General:  Patient laying in bed in no acute  distress.  HEENT: no scleral icterus  Cardiovascular: RRR without MRG, 2+ peripheral pulses, no edema  Respiratory: CTA biL, good air movement, no wheezing, no crackles, no rales  Abdomen: soft, non tender, BS +, no guarding  MSK/Extremities: no clubbing/cyanosis, no joint swelling, right thigh wound without drainage, dressing clean dry and intact.  Data Reviewed: Basic Metabolic Panel:  Recent Labs Lab 12/23/14 0335 12/25/14 0331 12/26/14 0250 12/26/14 2315 12/27/14 0635  NA 136 138 137 139 141  K 4.4 4.2 4.1 4.9 3.1*  CL 101 104 104 101 100*  CO2 23 27 25 28  32  GLUCOSE 76 102* 86 105* 87  BUN 24* 26* 38* 12 13  CREATININE 6.24* 5.22* 6.50* 2.90* 3.32*  CALCIUM 8.7* 8.8* 8.9 7.8* 7.9*  PHOS  --   --  2.7 2.0* 1.7*   Liver Function Tests:  Recent Labs Lab 12/22/14 0432 12/26/14 0250 12/26/14 2315 12/27/14 0635  AST 28  --   --   --   ALT 12*  --   --   --   ALKPHOS 66  --   --   --   BILITOT 1.1  --   --   --   PROT 5.6*  --   --   --   ALBUMIN 2.9* 2.4* 2.2* 2.1*   CBC:  Recent Labs Lab 12/24/14 0232 12/25/14 0331 12/26/14 0250 12/26/14 2040 12/27/14 0635 12/27/14 1110  WBC 4.8 3.4* 2.9* 2.6* 2.3*  --   HGB 6.4* 8.0* 8.2* 7.8* 7.1* 7.0*  HCT 20.5* 25.4* 25.5* 23.1* 22.1* 22.0*  MCV 92.8 91.7 92.1 88.2 92.1  --   PLT 65* 72* 78* 53* 63*  --     ProBNP (last 3 results)  Recent Labs  03/27/14 1747 04/12/14 0105  PROBNP 37853.0* 45045.0*    Recent Results (from the past 240 hour(s))  MRSA PCR Screening     Status: None   Collection Time: 12/22/14 12:37 PM  Result Value Ref Range Status   MRSA by PCR NEGATIVE NEGATIVE Final    Comment:        The GeneXpert MRSA Assay (FDA approved for NASAL specimens only), is one component of a comprehensive MRSA colonization surveillance program. It is not intended to diagnose MRSA infection nor to guide or monitor treatment for MRSA infections.   Wound culture     Status: None   Collection  Time: 12/23/14  8:24 AM  Result Value Ref Range Status   Specimen Description WOUND RIGHT GROIN  Final   Special Requests Normal  Final   Gram Stain   Final    RARE WBC PRESENT, PREDOMINANTLY PMN FEW SQUAMOUS EPITHELIAL CELLS PRESENT FEW GRAM POSITIVE COCCI IN PAIRS Performed at [facility]    Culture   Final    MODERATE METHICILLIN RESISTANT STAPHYLOCOCCUS AUREUS Note: RIFAMPIN AND GENTAMICIN SHOULD NOT BE USED AS SINGLE DRUGS FOR TREATMENT OF STAPH INFECTIONS. This organism DOES NOT demonstrate inducible Clindamycin resistance in vitro. CRITICAL RESULT CALLED TO, READ BACK BY AND VERIFIED WITH:  REBECCA G 8/1  @216  BY REAMM Performed at [facility]    Report Status 12/26/2014 FINAL  Final   Organism ID, Bacteria METHICILLIN RESISTANT STAPHYLOCOCCUS AUREUS  Final      Susceptibility   Methicillin resistant staphylococcus aureus - MIC*    CLINDAMYCIN <=0.25 SENSITIVE Sensitive     ERYTHROMYCIN >=8 RESISTANT Resistant     GENTAMICIN <=0.5 SENSITIVE Sensitive     LEVOFLOXACIN <=0.12 SENSITIVE Sensitive     OXACILLIN >=4 RESISTANT Resistant     RIFAMPIN <=0.5 SENSITIVE Sensitive     TRIMETH/SULFA <=10 SENSITIVE Sensitive     VANCOMYCIN <=0.5 SENSITIVE Sensitive     TETRACYCLINE <=1 SENSITIVE Sensitive     * MODERATE METHICILLIN RESISTANT STAPHYLOCOCCUS AUREUS  Culture, blood (x 2)     Status: None (Preliminary result)   Collection Time: 12/23/14 10:10 AM  Result Value Ref Range Status   Specimen Description BLOOD LEFT HAND  Final   Special Requests IN PEDIATRIC BOTTLE 1CC  Final   Culture NO GROWTH 4 DAYS  Final   Report Status PENDING  Incomplete  Culture, blood (x 2)     Status: None (Preliminary result)   Collection Time: 12/23/14 10:50 AM  Result Value Ref Range Status   Specimen Description BLOOD LEFT HAND  Final   Special Requests IN PEDIATRIC BOTTLE 3CC  Final   Culture NO GROWTH 4 DAYS  Final   Report Status PENDING  Incomplete    Scheduled Meds: .  sodium chloride   Intravenous Once  . atorvastatin  40 mg Oral Daily  . azaTHIOprine  75 mg Oral q morning - 10a  . carvedilol  6.25 mg Oral QHS  . colesevelam  3,750 mg Oral Daily  . [START ON 12/28/2014] darbepoetin (ARANESP) injection - DIALYSIS  200 mcg Intravenous Q Wed-HD  . doxercalciferol  3 mcg Intravenous Q M,W,F-HD  . famotidine  10 mg Oral QHS  . feeding supplement  1 Container Oral BID BM  . isosorbide dinitrate  40 mg Oral TID  . multivitamin  1 tablet Oral QHS  . mupirocin ointment  1 application Nasal BID  . predniSONE  5 mg Oral Q breakfast  . senna-docusate  3 tablet Oral BID  . Sirolimus  3 tablet Oral Daily  . sodium chloride  500 mL Intravenous Once  . vancomycin  750 mg Intravenous Q M,W,F-HD  . warfarin  3 mg Oral ONCE-1800  . Warfarin - Pharmacist Dosing Inpatient   Does not apply [id number]   Marzetta Board, MD [facility] Pager [phone]. If 7 PM - 7 AM, please contact night-coverage at [email or web address], password [facility] 12/27/2014, 2:41 PM  LOS: 4 days

## 2014-12-27 NOTE — Progress Notes (Addendum)
Subjective:   Feeling well, has not been up at all.  Objective Filed Vitals:   12/26/14 1934 12/26/14 2044 12/27/14 0623 12/27/14 0827  BP: 103/50 127/46 106/48 95/65  Pulse: 76 83 80 85  Temp: 96.7 F (35.9 C) 97.5 F (36.4 C) 98.4 F (36.9 C) 97.7 F (36.5 C)  TempSrc: Oral Oral Oral Oral  Resp: 18 17 17 17   Height:      Weight: 72.5 kg (159 lb 13.3 oz)     SpO2: 99% 100% 99% 100%   Physical Exam General: alert and oriented. No acute distress.  Heart: RRR Lungs: CTA, unlabored Abdomen: soft, nontender +BS  Extremities:upper ext edema.  Dialysis Access: R thigh AVG +b/t  Dialysis Orders: Center: [facility] on MWF . EDW 68 kg HD [facility] 2.0 K 2.0 Ca Time 4 hours No Heparin. Access LIJ per cath BFR 400 DFR Autoflow 1.5 Linear sodium  Aranesp 200 mcg IV (last dose 12/21/2014) Hgb 8.2 7/22 and 7.2 7/27 Hectoral 3 mcg IV MWF (Last dose 12/21/2014)  Assessment/Plan: 1. Sepsis - source unknown VVS,- wound culture +MRSA on Vanc, blood cultures no growth CXR neg; Afebrile, 2. ESRD - MWF - no heparin HD - HD pending tomorrow 3. Anemia - Hgb 7.1- down from 7.8 last night -- was 8.2 7/22 7.2 7/27 -heme neg;  Aranesp 200 - last given 7/27 - transfused one unit. Wants to recheck hgb before another transfusion 4. Secondary hyperparathyroidism - continue hectorol discontinue phoslo due to hypophosphatemia. 5. BP/volume - BP better- had been hypotensive, small right effusion on CXR  - attempt to get standing wts 6. Nutrition - alb 2.1 renal diet. Felipe Drone bid/vits- 7. Constipation - per primary 8. Hx heart tx, failed renal tx - continue current immunosuprresive tx  9. Hx DVT on coumadin INR jumped from 3.8 to 6- 1.56 today 10. Thrombocytopenia - acute drop likely related to infx  11. Leukopenia- will check sirolimus level and continue with current regimen for now.  Continue to follow.   12. Severe PAD/Right LE pain- xray neg tib/fib for fx- pain may be related to PAD- no pain  today 13. DNR   Shelle Iron, NP [place] [phone] 12/27/2014,9:21 AM  LOS: 4 days    Additional Objective Labs: Basic Metabolic Panel:  Recent Labs Lab 12/26/14 0250 12/26/14 2315 12/27/14 0635  NA 137 139 141  K 4.1 4.9 3.1*  CL 104 101 100*  CO2 25 28 32  GLUCOSE 86 105* 87  BUN 38* 12 13  CREATININE 6.50* 2.90* 3.32*  CALCIUM 8.9 7.8* 7.9*  PHOS 2.7 2.0* 1.7*   Liver Function Tests:  Recent Labs Lab 12/22/14 0432 12/26/14 0250 12/26/14 2315 12/27/14 0635  AST 28  --   --   --   ALT 12*  --   --   --   ALKPHOS 66  --   --   --   BILITOT 1.1  --   --   --   PROT 5.6*  --   --   --   ALBUMIN 2.9* 2.4* 2.2* 2.1*   No results for input(s): LIPASE, AMYLASE in the last 168 hours. CBC:  Recent Labs Lab 12/24/14 0232 12/25/14 0331 12/26/14 0250 12/26/14 2040 12/27/14 0635  WBC 4.8 3.4* 2.9* 2.6* 2.3*  HGB 6.4* 8.0* 8.2* 7.8* 7.1*  HCT 20.5* 25.4* 25.5* 23.1* 22.1*  MCV 92.8 91.7 92.1 88.2 92.1  PLT 65* 72* 78* 53* PENDING   Blood Culture  Component Value Date/Time   SDES BLOOD LEFT HAND 12/23/2014 1050   SPECREQUEST IN PEDIATRIC BOTTLE 3CC 12/23/2014 1050   CULT NO GROWTH 3 DAYS 12/23/2014 1050   REPTSTATUS PENDING 12/23/2014 1050    Cardiac Enzymes: No results for input(s): CKTOTAL, CKMB, CKMBINDEX, TROPONINI in the last 168 hours. CBG: No results for input(s): GLUCAP in the last 168 hours. Iron Studies: No results for input(s): IRON, TIBC, TRANSFERRIN, FERRITIN in the last 72 hours. @lablastinr3 @ Studies/Results: No results found. Medications:   . atorvastatin  40 mg Oral Daily  . azaTHIOprine  75 mg Oral q morning - 10a  . calcium acetate  667 mg Oral BID WC  . carvedilol  6.25 mg Oral QHS  . colesevelam  3,750 mg Oral Daily  . [START ON 12/28/2014] darbepoetin (ARANESP) injection - DIALYSIS  200 mcg Intravenous Q Wed-HD  . doxercalciferol  3 mcg Intravenous Q M,W,F-HD  . famotidine  10 mg Oral QHS  .  feeding supplement  1 Container Oral BID BM  . isosorbide dinitrate  40 mg Oral TID  . multivitamin  1 tablet Oral QHS  . mupirocin ointment  1 application Nasal BID  . predniSONE  5 mg Oral Q breakfast  . senna-docusate  3 tablet Oral BID  . Sirolimus  3 tablet Oral Daily  . sodium chloride  500 mL Intravenous Once  . vancomycin  750 mg Intravenous Q M,W,F-HD  . Warfarin - Pharmacist Dosing Inpatient   Does not apply q1800     I have seen and examined this patient and agree with plan as outlined by Shelle Iron except for additions made above in red. Rama Mcclintock A,MD 12/27/2014 9:46 AM

## 2014-12-27 NOTE — Progress Notes (Signed)
ANTICOAGULATION CONSULT NOTE - Follow Up Consult  Pharmacy Consult for Warfarin Indication: Hx recurrent DVT  Allergies  Allergen Reactions  . Lisinopril Swelling    Lips and tongue swell  . Niacin And Related Other (See Comments)    unknown  . Norvasc [Amlodipine Besylate] Rash    Flushing  . Penicillins Rash    Patient Measurements: Height: 6' (182.9 cm) Weight: 159 lb 13.3 oz (72.5 kg) IBW/kg (Calculated) : 77.6  Vital Signs: Temp: 97.7 F (36.5 C) (08/02 0827) Temp Source: Oral (08/02 0827) BP: 95/65 mmHg (08/02 0827) Pulse Rate: 85 (08/02 0827)  Labs:  Recent Labs  12/25/14 0331 12/26/14 0250 12/26/14 2040 12/26/14 2315 12/27/14 0635  HGB 8.0* 8.2* 7.8*  --  7.1*  HCT 25.4* 25.5* 23.1*  --  22.1*  PLT 72* 78* 53*  --  63*  LABPROT 51.7* 56.5*  --   --  18.7*  INR 6.02* 6.78*  --   --  1.56*  CREATININE 5.22* 6.50*  --  2.90* 3.32*    Estimated Creatinine Clearance: 21.5 mL/min (by C-G formula based on Cr of 3.32).   Assessment: 31 YOM who continues on warfarin resumed from PTA for hx recurrent DVT. INR on admit was therapeutic at 2.39 on PTA dose of 5 mg daily. Due to an interaction with Levaquin (given on 7/29) - the INR elevated quickly requiring reversal with Vit K on 8/1. INR now down to 1.56 and SUBtherapeutic - confirmed with Dr. Cruzita Lederer that warfarin is to be resumed.   The patient is noted to have a small hematoma which is stable. Hgb 7.1 << 7.8, plts 63. The patient was re-educated on warfarin this admission  Goal of Therapy:  INR 2-3 Monitor platelets by anticoagulation protocol: Yes   Plan:  1. Warfarin 3 mg x 1 dose at 1800 today 2. Will continue to monitor for any signs/symptoms of bleeding and will follow up with PT/INR in the a.m.   Alycia Rossetti, PharmD, BCPS Clinical Pharmacist Pager: [phone] 12/27/2014 11:17 AM

## 2014-12-28 DIAGNOSIS — N186 End stage renal disease: Secondary | ICD-10-CM

## 2014-12-28 DIAGNOSIS — L8990 Pressure ulcer of unspecified site, unspecified stage: Secondary | ICD-10-CM | POA: Insufficient documentation

## 2014-12-28 LAB — DIFFERENTIAL
Basophils Absolute: 0 10*3/uL (ref 0.0–0.1)
Basophils Relative: 0 % (ref 0–1)
EOS PCT: 1 % (ref 0–5)
Eosinophils Absolute: 0 10*3/uL (ref 0.0–0.7)
Lymphocytes Relative: 63 % — ABNORMAL HIGH (ref 12–46)
Lymphs Abs: 1.6 10*3/uL (ref 0.7–4.0)
MONO ABS: 0.3 10*3/uL (ref 0.1–1.0)
Monocytes Relative: 11 % (ref 3–12)
NEUTROS ABS: 0.6 10*3/uL — AB (ref 1.7–7.7)
NEUTROS PCT: 25 % — AB (ref 43–77)

## 2014-12-28 LAB — CBC
HCT: 24.2 % — ABNORMAL LOW (ref 39.0–52.0)
HEMOGLOBIN: 7.8 g/dL — AB (ref 13.0–17.0)
MCH: 29.2 pg (ref 26.0–34.0)
MCHC: 32.2 g/dL (ref 30.0–36.0)
MCV: 90.6 fL (ref 78.0–100.0)
PLATELETS: 55 10*3/uL — AB (ref 150–400)
RBC: 2.67 MIL/uL — ABNORMAL LOW (ref 4.22–5.81)
RDW: 18.8 % — AB (ref 11.5–15.5)
WBC: 2.5 10*3/uL — ABNORMAL LOW (ref 4.0–10.5)

## 2014-12-28 LAB — CULTURE, BLOOD (ROUTINE X 2)
Culture: NO GROWTH
Culture: NO GROWTH

## 2014-12-28 LAB — RENAL FUNCTION PANEL
ANION GAP: 6 (ref 5–15)
Albumin: 2.1 g/dL — ABNORMAL LOW (ref 3.5–5.0)
BUN: 23 mg/dL — ABNORMAL HIGH (ref 6–20)
CALCIUM: 8.3 mg/dL — AB (ref 8.9–10.3)
CO2: 30 mmol/L (ref 22–32)
Chloride: 103 mmol/L (ref 101–111)
Creatinine, Ser: 4.96 mg/dL — ABNORMAL HIGH (ref 0.61–1.24)
GFR calc Af Amer: 12 mL/min — ABNORMAL LOW (ref >60)
GFR, EST NON AFRICAN AMERICAN: 11 mL/min — AB (ref >60)
Glucose, Bld: 112 mg/dL — ABNORMAL HIGH (ref 65–99)
PHOSPHORUS: 2.3 mg/dL — AB (ref 2.5–4.6)
Potassium: 3.1 mmol/L — ABNORMAL LOW (ref 3.5–5.1)
SODIUM: 139 mmol/L (ref 135–145)

## 2014-12-28 LAB — TYPE AND SCREEN
ABO/RH(D): O POS
Antibody Screen: NEGATIVE
Unit division: 0
Unit division: 0

## 2014-12-28 LAB — PROTIME-INR
INR: 1.59 — ABNORMAL HIGH (ref 0.00–1.49)
PROTHROMBIN TIME: 19 s — AB (ref 11.6–15.2)

## 2014-12-28 LAB — PREPARE RBC (CROSSMATCH)

## 2014-12-28 LAB — SIROLIMUS LEVEL: SIROLIMUS (RAPAMYCIN): 2.6 ng/mL — AB (ref 3.0–20.0)

## 2014-12-28 MED ORDER — LIDOCAINE HCL (PF) 1 % IJ SOLN: 5.0000 mL | INTRAMUSCULAR | Status: DC | PRN

## 2014-12-28 MED ORDER — ALTEPLASE 2 MG IJ SOLR
2.0000 mg | Freq: Once | INTRAMUSCULAR | Status: DC | PRN
  Filled 2014-12-28: qty 2

## 2014-12-28 MED ORDER — NEPRO/CARBSTEADY PO LIQD: 237.0000 mL | ORAL | Status: DC | PRN

## 2014-12-28 MED ORDER — SODIUM CHLORIDE 0.9 % IV SOLN: 100.0000 mL | INTRAVENOUS | Status: DC | PRN

## 2014-12-28 MED ORDER — PENTAFLUOROPROP-TETRAFLUOROETH EX AERO: 1.0000 "application " | INHALATION_SPRAY | CUTANEOUS | Status: DC | PRN

## 2014-12-28 MED ORDER — DOXERCALCIFEROL 4 MCG/2ML IV SOLN
INTRAVENOUS | Status: AC
  Filled 2014-12-28: qty 2

## 2014-12-28 MED ORDER — HEPARIN SODIUM (PORCINE) 1000 UNIT/ML DIALYSIS: 1000.0000 [IU] | INTRAMUSCULAR | Status: DC | PRN

## 2014-12-28 MED ORDER — LIDOCAINE-PRILOCAINE 2.5-2.5 % EX CREA
1.0000 "application " | TOPICAL_CREAM | CUTANEOUS | Status: DC | PRN
  Filled 2014-12-28: qty 5

## 2014-12-28 MED ORDER — WARFARIN SODIUM 3 MG PO TABS
3.0000 mg | ORAL_TABLET | Freq: Once | ORAL | Status: DC
  Filled 2014-12-28: qty 1

## 2014-12-28 MED ORDER — SODIUM CHLORIDE 0.9 % IV SOLN: Freq: Once | INTRAVENOUS | Status: DC

## 2014-12-28 MED ORDER — DARBEPOETIN ALFA 200 MCG/0.4ML IJ SOSY
PREFILLED_SYRINGE | INTRAMUSCULAR | Status: AC
  Filled 2014-12-28: qty 0.4

## 2014-12-28 MED ORDER — LIDOCAINE HCL (PF) 2 % IJ SOLN
0.0000 mL | Freq: Once | INTRAMUSCULAR | Status: AC | PRN
  Filled 2014-12-28: qty 20

## 2014-12-28 MED ORDER — BOOST / RESOURCE BREEZE PO LIQD
1.0000 | Freq: Three times a day (TID) | ORAL | Status: DC
  Administered 2014-12-28 – 2014-12-31 (×6): 1 via ORAL

## 2014-12-28 NOTE — Progress Notes (Signed)
  Catheter Removal Procedure Note    Diagnosis: ESRD  Plan:  Remove left diatek catheter  Consent signed:  Yes.   Time out completed:  Yes.   Coumadin:  Yes.   PT/INR (if applicable):  8.33 Other labs:  Procedure: 1.  Sterile prepping and draping over catheter area 2. 6 ml 2% lidocaine plain instilled at removal site. 3.  left catheter removed in its entirety with cuff in tact. 4.  Complications:  no 5. Tip of catheter sent for culture:  Yes.     Patient tolerated procedure well:  Yes.   Pressure held, no bleeding noted, dressing applied Instructions given to the pt regarding wound care and bleeding.  Do not lay flat for 4 hours.  Hold tonight's dose of coumadin and restart tomorrow   Leontine Locket, PA-C 12/28/2014 3:35 PM

## 2014-12-28 NOTE — Progress Notes (Signed)
ANTICOAGULATION CONSULT NOTE - Follow Up Consult  Pharmacy Consult for Warfarin Indication: Hx recurrent DVT  Allergies  Allergen Reactions  . Lisinopril Swelling    Lips and tongue swell  . Niacin And Related Other (See Comments)    unknown  . Norvasc [Amlodipine Besylate] Rash    Flushing  . Penicillins Rash    Patient Measurements: Height: 6' (182.9 cm) Weight: 173 lb 1 oz (78.5 kg) IBW/kg (Calculated) : 77.6  Vital Signs: Temp: 97 F (36.1 C) (08/03 1135) Temp Source: Oral (08/03 1135) BP: 90/49 mmHg (08/03 1130) Pulse Rate: 86 (08/03 1130)  Labs:  Recent Labs  12/26/14 0250 12/26/14 2040 12/26/14 2315 12/27/14 0635 12/27/14 1110 12/28/14 0545  HGB 8.2* 7.8*  --  7.1* 7.0* 7.8*  HCT 25.5* 23.1*  --  22.1* 22.0* 24.2*  PLT 78* 53*  --  63*  --  55*  LABPROT 56.5*  --   --  18.7*  --  19.0*  INR 6.78*  --   --  1.56*  --  1.59*  CREATININE 6.50*  --  2.90* 3.32*  --  4.96*    Estimated Creatinine Clearance: 15.4 mL/min (by C-G formula based on Cr of 4.96).   Assessment: 40 YOM who continues on warfarin resumed from PTA for hx recurrent DVT. INR on admit was therapeutic at 2.39 on PTA dose of 5 mg daily. Due to an interaction with Levaquin (given on 7/29) - the INR elevated quickly requiring reversal with Vit K on 8/1. INR down to 1.56 on 8/2)  - confirmed with Dr. Cruzita Lederer that warfarin is to be resumed.   The patient is noted to have a small hematoma which is stable. Hgb 7.1 << 7.8, plts 55. The patient was re-educated on warfarin this admission  Goal of Therapy:  INR 2-3 Monitor platelets by anticoagulation protocol: Yes   Plan:  1. Warfarin 3 mg x 1 dose at 1800 today 2. Will continue to monitor for any signs/symptoms of bleeding and will follow up with PT/INR/CBC in the a.m.   Rosslyn Pasion C. Lennox Grumbles, PharmD Pharmacy Resident  Pager: [phone] 12/28/2014 11:44 AM

## 2014-12-28 NOTE — Progress Notes (Signed)
Nutrition Follow-up  DOCUMENTATION CODES:   Not applicable  INTERVENTION:  Provide Boost Breeze po TID, each supplement provides 250 kcal and 9 grams of protein.  Provide nourishment snacks. (Ordered).  Encourage adequate PO intake.   NUTRITION DIAGNOSIS:   Increased nutrient needs related to wound healing as evidenced by estimated needs; ongoing  GOAL:   Patient will meet greater than or equal to 90% of their needs; progressing  MONITOR:   PO intake, Supplement acceptance, Diet advancement, Labs, Weight trends, Skin, I & O's  REASON FOR ASSESSMENT:   Malnutrition Screening Tool    ASSESSMENT:   Brandon Sharp is a 69 y.o. male With a history of diabetes, stage renal disease on hemodialysis, hypertension, peripheral vascular disease, DVT on chronic anticoagulation, congestive heart failure status post heart transplant. He was recently hospitalized earlier this month with end-stage renal disease and symptomatic anemia. He was in skilled until the 20th of this month, where he was discharged to home. He reports one week of constipation with no stooling. He has been having flatulence. He presents with 24 hours of worsening abdominal pain, particularly in the left lower quadrant with no radiation.  Pt reports appetite has been improving. Meal completion has been 0-50%. Pt currently has [name] ordered and has been consuming them. RD to modify orders to TID to aid in caloric and protein needs. Pt additionally requests nourishment snacks (tuna salad sandwich). RD to order. Pt was encouraged to eat his food at meals and to drink his supplements.   Potassium and phosphorous WNL.  Diet Order:  Diet renal with fluid restriction Fluid restriction:: 1200 mL Fluid; Room service appropriate?: Yes; Fluid consistency:: Thin  Skin:  Wound (see comment) (Stage II pressure ulcer on sacrum, open incision R groin), +2 UE, +1 LE edema  Last BM:  8/2  Height:   Ht Readings from Last  1 Encounters:  12/23/14 6' (1.829 m)    Weight:   Wt Readings from Last 1 Encounters:  12/28/14 173 lb 1 oz (78.5 kg)    Ideal Body Weight:  80.9 kg  BMI:  Body mass index is 23.47 kg/(m^2).  Estimated Nutritional Needs:   Kcal:  2100-2300  Protein:  100-115 grams  Fluid:  1.2 L/day  EDUCATION NEEDS:   No education needs identified at this time  Corrin Parker, MS, RD, LDN Pager # [phone] After hours/ weekend pager # [phone]

## 2014-12-28 NOTE — Progress Notes (Signed)
PROGRESS NOTE  Brandon Sharp VFI:[id number] DOB: May 18, 1946 DOA: 12/22/2014 PCP: [name], MD  HPI: 69 year old male with ESRD on HD, type 2 diabetes mellitus, HTN, peripheral vascular disease, DVT on chronic anticoagulation, CHF s/p heart transplant presented to the ED for constipation x 1 week. He had been in a SNIF until 7/20 where he was discharge home after being hospitalized for ESRD with symptomatic anemia. He has not passed stool for one week, but has passed flatus. He complains of abdominal pain that was worsening 24 hours prior to admission and described as a cramping. Manual disimpaction and a smog enema were attempted in the ED, which the patient did not tolerate. Patient denies nausea, vomiting, diarrhea, chest pain, shortness of breath, melena, hematochezia, and urinary symptoms.   Subjective / 24 H Interval events - no chest pain, shortness of breath, no abdominal pain, nausea or vomiting. Seen in HD  Assessment/Plan: Principal Problem:   Sepsis Active Problems:   DVT (deep venous thrombosis)   Heart transplanted   CKD (chronic kidney disease), stage V   ESRD on hemodialysis   Constipation   Fecal impaction  Sepsis of unclear source, possible MRSA skin infection - sepsis physiology improving, initially on broad-spectrum antibiotics with vancomycin and aztreonam, discontinued levofloxacin 7/31 due to elevated INR. His blood cultures have remained negative, he is growing MRSA from his right groin wound - now on Vancomycin day 6, will get Vanc with HD, plan for total of 14 days  Anemia due to chronic kidney disease / worsening in the setting of sepsis - Patient did require one unit of packed red blood cells for hemoglobin of 6.4 on 7/28 - This is likely in the setting of sepsis, no bleeding noted, his hemoglobin has improved after the blood transfusion and has remained overall stable. Mild drop today, will transfuse a second unit.  Thrombocytopenia / leukopenia -  has been chronically low, no bleeding - sirolimus level 2.6  Constipation/Fecal Impaction - Per patient, has been constipated x 1 week - CT abdomen pelvis demonstrated increased stool burden without bowel obstruction  - This has resolved  S/P heart transplant - Transplant 07/10/2009 at [facility].  - On imuran and prednisone - Echo 07/2013: LVEF 55-60%, no wall motion abnormalities  Hx of DVT, on chronic anticoagulation - continue Coumadin  Hypertension - On carvedilol, continue to monitor   ESRD - History of renal transplant and rejection  - On Hemodialysis M/W/F - nephrology following, HD today, if tolerates using AVG will pull HD cath   Type 2 Diabetes Mellitus - Controlled with short acting insulin  Weakness - PT evaluation pending   Diet: Diet renal with fluid restriction Fluid restriction:: 1200 mL Fluid; Room service appropriate?: Yes; Fluid consistency:: Thin Fluids: NS DVT Prophylaxis: Heparin  Code Status: DNR Family Communication: Patient Disposition Plan: Remain inpatient, pending PT evaluation  Consultants:  Vascular surgery  Nephrology   Antibiotics   Levofloxacin 12/23/14 >>12/25/14  Vancomycin 12/23/14 >>   Aztreonam 12/23/14 >>8/1   Studies  No results found. Objective  Filed Vitals:   12/28/14 1230 12/28/14 1300 12/28/14 1325 12/28/14 1424  BP: 115/53 122/57 124/55 137/53  Pulse: 79 82 82 86  Temp:    97.9 F (36.6 C)  TempSrc:    Oral  Resp: 14 24 16 18   Height:      Weight:      SpO2:    100%    Intake/Output Summary (Last 24 hours) at 12/28/14 1513 Last data filed  at 12/28/14 0600  Gross per 24 hour  Intake    935 ml  Output      0 ml  Net    935 ml   Filed Weights   12/27/14 2132 12/28/14 0628 12/28/14 0900  Weight: 79.5 kg (175 lb 4.3 oz) 69.264 kg (152 lb 11.2 oz) 78.5 kg (173 lb 1 oz)   Exam:  General:  Patient laying in bed in no acute distress, undergoing HD.  HEENT: no scleral icterus  Cardiovascular: RRR  without MRG, 2+ peripheral pulses, no edema  Respiratory: CTA biL, good air movement, no wheezing, no crackles, no rales  Abdomen: soft, non tender, BS +, no guarding  MSK/Extremities: no clubbing/cyanosis, no joint swelling, right thigh wound without drainage, dressing clean dry and intact.  Data Reviewed: Basic Metabolic Panel:  Recent Labs Lab 12/25/14 0331 12/26/14 0250 12/26/14 2315 12/27/14 0635 12/28/14 0545  NA 138 137 139 141 139  K 4.2 4.1 4.9 3.1* 3.1*  CL 104 104 101 100* 103  CO2 27 25 28  32 30  GLUCOSE 102* 86 105* 87 112*  BUN 26* 38* 12 13 23*  CREATININE 5.22* 6.50* 2.90* 3.32* 4.96*  CALCIUM 8.8* 8.9 7.8* 7.9* 8.3*  PHOS  --  2.7 2.0* 1.7* 2.3*   Liver Function Tests:  Recent Labs Lab 12/22/14 0432 12/26/14 0250 12/26/14 2315 12/27/14 0635 12/28/14 0545  AST 28  --   --   --   --   ALT 12*  --   --   --   --   ALKPHOS 66  --   --   --   --   BILITOT 1.1  --   --   --   --   PROT 5.6*  --   --   --   --   ALBUMIN 2.9* 2.4* 2.2* 2.1* 2.1*   CBC:  Recent Labs Lab 12/25/14 0331 12/26/14 0250 12/26/14 2040 12/27/14 0635 12/27/14 1110 12/28/14 0545  WBC 3.4* 2.9* 2.6* 2.3*  --  2.5*  NEUTROABS  --   --   --   --   --  0.6*  HGB 8.0* 8.2* 7.8* 7.1* 7.0* 7.8*  HCT 25.4* 25.5* 23.1* 22.1* 22.0* 24.2*  MCV 91.7 92.1 88.2 92.1  --  90.6  PLT 72* 78* 53* 63*  --  55*    ProBNP (last 3 results)  Recent Labs  03/27/14 1747 04/12/14 0105  PROBNP 37853.0* 45045.0*    Recent Results (from the past 240 hour(s))  MRSA PCR Screening     Status: None   Collection Time: 12/22/14 12:37 PM  Result Value Ref Range Status   MRSA by PCR NEGATIVE NEGATIVE Final    Comment:        The GeneXpert MRSA Assay (FDA approved for NASAL specimens only), is one component of a comprehensive MRSA colonization surveillance program. It is not intended to diagnose MRSA infection nor to guide or monitor treatment for MRSA infections.   Wound culture      Status: None   Collection Time: 12/23/14  8:24 AM  Result Value Ref Range Status   Specimen Description WOUND RIGHT GROIN  Final   Special Requests Normal  Final   Gram Stain   Final    RARE WBC PRESENT, PREDOMINANTLY PMN FEW SQUAMOUS EPITHELIAL CELLS PRESENT FEW GRAM POSITIVE COCCI IN PAIRS Performed at [facility]    Culture   Final    MODERATE METHICILLIN RESISTANT STAPHYLOCOCCUS AUREUS Note: RIFAMPIN  AND GENTAMICIN SHOULD NOT BE USED AS SINGLE DRUGS FOR TREATMENT OF STAPH INFECTIONS. This organism DOES NOT demonstrate inducible Clindamycin resistance in vitro. CRITICAL RESULT CALLED TO, READ BACK BY AND VERIFIED WITH: REBECCA G 8/1  @216  BY REAMM Performed at [facility]    Report Status 12/26/2014 FINAL  Final   Organism ID, Bacteria METHICILLIN RESISTANT STAPHYLOCOCCUS AUREUS  Final      Susceptibility   Methicillin resistant staphylococcus aureus - MIC*    CLINDAMYCIN <=0.25 SENSITIVE Sensitive     ERYTHROMYCIN >=8 RESISTANT Resistant     GENTAMICIN <=0.5 SENSITIVE Sensitive     LEVOFLOXACIN <=0.12 SENSITIVE Sensitive     OXACILLIN >=4 RESISTANT Resistant     RIFAMPIN <=0.5 SENSITIVE Sensitive     TRIMETH/SULFA <=10 SENSITIVE Sensitive     VANCOMYCIN <=0.5 SENSITIVE Sensitive     TETRACYCLINE <=1 SENSITIVE Sensitive     * MODERATE METHICILLIN RESISTANT STAPHYLOCOCCUS AUREUS  Culture, blood (x 2)     Status: None   Collection Time: 12/23/14 10:10 AM  Result Value Ref Range Status   Specimen Description BLOOD LEFT HAND  Final   Special Requests IN PEDIATRIC BOTTLE 1CC  Final   Culture NO GROWTH 5 DAYS  Final   Report Status 12/28/2014 FINAL  Final  Culture, blood (x 2)     Status: None   Collection Time: 12/23/14 10:50 AM  Result Value Ref Range Status   Specimen Description BLOOD LEFT HAND  Final   Special Requests IN PEDIATRIC BOTTLE 3CC  Final   Culture NO GROWTH 5 DAYS  Final   Report Status 12/28/2014 FINAL  Final    Scheduled Meds: . sodium  chloride   Intravenous Once  . sodium chloride   Intravenous Once  . atorvastatin  40 mg Oral Daily  . azaTHIOprine  75 mg Oral q morning - 10a  . carvedilol  6.25 mg Oral QHS  . colesevelam  3,750 mg Oral Daily  . darbepoetin (ARANESP) injection - DIALYSIS  200 mcg Intravenous Q Wed-HD  . doxercalciferol  3 mcg Intravenous Q M,W,F-HD  . famotidine  10 mg Oral QHS  . feeding supplement  1 Container Oral TID BM  . isosorbide dinitrate  40 mg Oral TID  . multivitamin  1 tablet Oral QHS  . predniSONE  5 mg Oral Q breakfast  . senna-docusate  3 tablet Oral BID  . Sirolimus  3 tablet Oral Daily  . sodium chloride  500 mL Intravenous Once  . vancomycin  750 mg Intravenous Q M,W,F-HD  . [START ON 12/29/2014] warfarin  3 mg Oral ONCE-1800  . Warfarin - Pharmacist Dosing Inpatient   Does not apply [id number]   Marzetta Board, MD [facility] Pager [phone]. If 7 PM - 7 AM, please contact night-coverage at [email or web address], password [facility] 12/28/2014, 3:13 PM  LOS: 5 days

## 2014-12-28 NOTE — Progress Notes (Signed)
H&P    CC:  Catheter removal   HPI:  This is a 70 y.o. male who needs diatek catheter removal.  He was admitted to the hospital on 12/23/14 with fevers.  He had a right thigh graft placed on 10/20/14 by Dr. Bridgett Larsson.  This has been used successfully without difficulty and Dr. Marval Regal has asked that we remove his catheter.  He is on coumadin and his INR today is 1.59.  Per renal, pt will be on vanc for next couple of weeks for wound infection.    Please see full consult on 12/23/14 for full details.   Past Medical History  Diagnosis Date  . Diabetes mellitus   . Hypertension   . Myocardial infarction 1985; 1990  . Angina   . Dysrhythmia   . DVT (deep venous thrombosis) ~ 12/2010    LLE  . Anemia   . Blood transfusion 06/1999    post heart transplant  . Peripheral vascular disease   . Renal failure     Hemodialysis MWF last 2 years, sse dr [name] nephrology, goes to [facility]  . DVT (deep venous thrombosis) 08/25/2014    Nearly occluded R IJ, and L subclavian DVT  . CHF (congestive heart failure) 2001    beffore transplant; had AICD that was explanted 07/19/99 following successful heart transplant  . Coronary artery disease     transplant cardiologist Dr. Corky Mull [facility])  . Pneumonia     FH:  Non-Contributory  History   Social History  . Marital Status: Married    Spouse Name: N/A  . Number of Children: N/A  . Years of Education: N/A   Occupational History  . Not on file.   Social History Main Topics  . Smoking status: Former Smoker -- 1.00 packs/day for 20 years    Types: Cigarettes    Quit date: 05/27/1988  . Smokeless tobacco: Never Used  . Alcohol Use: Yes     Comment: "Holidays"  . Drug Use: No  . Sexual Activity: Not Currently   Other Topics Concern  . Not on file   Social History Narrative    Allergies  Allergen Reactions  . Lisinopril Swelling    Lips and tongue swell  . Niacin And Related Other (See Comments)    unknown   . Norvasc [Amlodipine Besylate] Rash    Flushing  . Penicillins Rash    Current Facility-Administered Medications  Medication Dose Route Frequency Provider Last Rate Last Dose  . 0.9 %  sodium chloride infusion   Intravenous Once Shelle Iron, NP      . 0.9 %  sodium chloride infusion   Intravenous Once Donato Heinz, MD      . acetaminophen (TYLENOL) tablet 650 mg  650 mg Oral Q6H PRN Tanna Savoy Stinson, DO   650 mg at 12/24/14 6834   Or  . acetaminophen (TYLENOL) suppository 650 mg  650 mg Rectal Q6H PRN Truett Mainland, DO      . alum & mag hydroxide-simeth (MAALOX/MYLANTA) 200-200-20 MG/5ML suspension 30 mL  30 mL Oral Q6H PRN Tanna Savoy Stinson, DO      . atorvastatin (LIPITOR) tablet 40 mg  40 mg Oral Daily Tanna Savoy Stinson, DO   40 mg at 12/27/14 1122  . azaTHIOprine (IMURAN) tablet 75 mg  75 mg Oral q morning - 10a Tanna Savoy Stinson, DO   75 mg at 12/27/14 1122  . carvedilol (COREG) tablet 6.25 mg  6.25 mg Oral QHS  Tanna Savoy Stinson, DO   6.25 mg at 12/27/14 2225  . colesevelam [facility]) tablet 3,750 mg  3,750 mg Oral Daily Tanna Savoy Stinson, DO   3,750 mg at 12/27/14 1123  . Darbepoetin Alfa (ARANESP) injection 200 mcg  200 mcg Intravenous Q Wed-HD Alric Seton, PA-C   200 mcg at 12/28/14 1039  . doxercalciferol (HECTOROL) injection 3 mcg  3 mcg Intravenous Q M,W,F-HD Alric Seton, PA-C   3 mcg at 12/28/14 1040  . famotidine (PEPCID) tablet 10 mg  10 mg Oral QHS Tanna Savoy Stinson, DO   10 mg at 12/27/14 2226  . feeding supplement (BOOST / RESOURCE BREEZE) liquid 1 Container  1 Container Oral TID BM Dale Bagdad, RD      . isosorbide dinitrate (DILATRATE-SR) capsule 40 mg  40 mg Oral TID Tanna Savoy Stinson, DO   40 mg at 12/27/14 2355  . lidocaine (XYLOCAINE) 2 % injection 0-20 mL  0-20 mL Intradermal Once PRN Alvia Grove, PA-C      . multivitamin (RENA-VIT) tablet 1 tablet  1 tablet Oral QHS Truett Mainland, DO   1 tablet at 12/27/14 2225  . mupirocin ointment (BACTROBAN) 2 % 1  application  1 application Nasal BID Caren Griffins, MD   1 application at 64/33/29 2233  . oxyCODONE-acetaminophen (PERCOCET/ROXICET) 5-325 MG per tablet 1 tablet  1 tablet Oral Q4H PRN Caren Griffins, MD   1 tablet at 12/26/14 1257  . predniSONE (DELTASONE) tablet 5 mg  5 mg Oral Q breakfast Tanna Savoy Stinson, DO   5 mg at 12/27/14 5188  . senna-docusate (Senokot-S) tablet 3 tablet  3 tablet Oral BID Truett Mainland, DO   3 tablet at 12/27/14 2225  . Sirolimus TABS 1.5 mg  3 tablet Oral Daily Donato Heinz, MD   1.5 mg at 12/27/14 1754  . sodium chloride 0.9 % bolus 500 mL  500 mL Intravenous Once Jeryl Columbia, NP      . sorbitol, milk of mag, mineral oil, glycerin (SMOG) enema  500 mL Rectal Q4H PRN Tanna Savoy Stinson, DO      . vancomycin (VANCOCIN) IVPB 750 mg/150 ml premix  750 mg Intravenous Q M,W,F-HD Cecilio Asper Batchelder, RPH   750 mg at 12/28/14 1218  . warfarin (COUMADIN) tablet 3 mg  3 mg Oral ONCE-1800 Meagan Ival Bible, RPH      . Warfarin - Pharmacist Dosing Inpatient   Does not apply Conway, DO   Stopped at 12/24/14 1800    ROS:  See HPI  PHYSICAL EXAM  Filed Vitals:   12/28/14 1424  BP: 137/53  Pulse: 86  Temp: 97.9 F (36.6 C)  Resp: 18    Gen:  Well developed well nourished HEENT:  normocephalic Neck:  Left IJ diatek in place. Lungs:  Non-labored Extremities:  Right groin wound looks good; +thrill within right thigh graft Skin:  No obvious rashes   Lab/X-ray:  INR 1.59 12/28/14  Impression: This is a 69 y.o. male here for diatek catheter removal  Plan:   -Removal of left diatek catheter today -will hold tonight's coumadin due to catheter removal and resume tomorrow -will send tip of catheter for culture   Leontine Locket, PA-C [facility] [phone] 12/28/2014 2:37 PM

## 2014-12-28 NOTE — Procedures (Signed)
Patient was seen on dialysis and the procedure was supervised. BFR 400 Via right thigh AVG BP is 98/50.  Patient appears to be tolerating treatment well.  Still a little weak.  Would complete two week course of vanco for wound infection.  Would also pull HD cath if he does well with next HD session (3 successful cannulations).

## 2014-12-28 NOTE — Progress Notes (Signed)
ANTIBIOTIC CONSULT NOTE - FOLLOW UP  Pharmacy Consult for Vancomycin  Indication: MRSA cellulitis  Allergies  Allergen Reactions  . Lisinopril Swelling    Lips and tongue swell  . Niacin And Related Other (See Comments)    unknown  . Norvasc [Amlodipine Besylate] Rash    Flushing  . Penicillins Rash    Patient Measurements: Height: 6' (182.9 cm) Weight: 173 lb 1 oz (78.5 kg) IBW/kg (Calculated) : 77.6   Vital Signs: Temp: 97 F (36.1 C) (08/03 1135) Temp Source: Oral (08/03 1135) BP: 95/53 mmHg (08/03 1147) Pulse Rate: 85 (08/03 1147) Intake/Output from previous day: 08/02 0701 - 08/03 0700 In: 8341 [P.O.:1080; Blood:335] Out: 0   Labs:  Recent Labs  12/26/14 2040 12/26/14 2315 12/27/14 0635 12/27/14 1110 12/28/14 0545  WBC 2.6*  --  2.3*  --  2.5*  HGB 7.8*  --  7.1* 7.0* 7.8*  PLT 53*  --  63*  --  55*  CREATININE  --  2.90* 3.32*  --  4.96*   Estimated Creatinine Clearance: 15.4 mL/min (by C-G formula based on Cr of 4.96). No results for input(s): VANCOTROUGH, VANCOPEAK, VANCORANDOM, GENTTROUGH, GENTPEAK, GENTRANDOM, TOBRATROUGH, TOBRAPEAK, TOBRARND, AMIKACINPEAK, AMIKACINTROU, AMIKACIN in the last 72 hours.   Microbiology: Recent Results (from the past 720 hour(s))  MRSA PCR Screening     Status: None   Collection Time: 12/22/14 12:37 PM  Result Value Ref Range Status   MRSA by PCR NEGATIVE NEGATIVE Final    Comment:        The GeneXpert MRSA Assay (FDA approved for NASAL specimens only), is one component of a comprehensive MRSA colonization surveillance program. It is not intended to diagnose MRSA infection nor to guide or monitor treatment for MRSA infections.   Wound culture     Status: None   Collection Time: 12/23/14  8:24 AM  Result Value Ref Range Status   Specimen Description WOUND RIGHT GROIN  Final   Special Requests Normal  Final   Gram Stain   Final    RARE WBC PRESENT, PREDOMINANTLY PMN FEW SQUAMOUS EPITHELIAL CELLS  PRESENT FEW GRAM POSITIVE COCCI IN PAIRS Performed at [facility]    Culture   Final    MODERATE METHICILLIN RESISTANT STAPHYLOCOCCUS AUREUS Note: RIFAMPIN AND GENTAMICIN SHOULD NOT BE USED AS SINGLE DRUGS FOR TREATMENT OF STAPH INFECTIONS. This organism DOES NOT demonstrate inducible Clindamycin resistance in vitro. CRITICAL RESULT CALLED TO, READ BACK BY AND VERIFIED WITH: REBECCA G 8/1  @216  BY REAMM Performed at [facility]    Report Status 12/26/2014 FINAL  Final   Organism ID, Bacteria METHICILLIN RESISTANT STAPHYLOCOCCUS AUREUS  Final      Susceptibility   Methicillin resistant staphylococcus aureus - MIC*    CLINDAMYCIN <=0.25 SENSITIVE Sensitive     ERYTHROMYCIN >=8 RESISTANT Resistant     GENTAMICIN <=0.5 SENSITIVE Sensitive     LEVOFLOXACIN <=0.12 SENSITIVE Sensitive     OXACILLIN >=4 RESISTANT Resistant     RIFAMPIN <=0.5 SENSITIVE Sensitive     TRIMETH/SULFA <=10 SENSITIVE Sensitive     VANCOMYCIN <=0.5 SENSITIVE Sensitive     TETRACYCLINE <=1 SENSITIVE Sensitive     * MODERATE METHICILLIN RESISTANT STAPHYLOCOCCUS AUREUS  Culture, blood (x 2)     Status: None (Preliminary result)   Collection Time: 12/23/14 10:10 AM  Result Value Ref Range Status   Specimen Description BLOOD LEFT HAND  Final   Special Requests IN PEDIATRIC BOTTLE 1CC  Final   Culture  NO GROWTH 4 DAYS  Final   Report Status PENDING  Incomplete  Culture, blood (x 2)     Status: None (Preliminary result)   Collection Time: 12/23/14 10:50 AM  Result Value Ref Range Status   Specimen Description BLOOD LEFT HAND  Final   Special Requests IN PEDIATRIC BOTTLE 3CC  Final   Culture NO GROWTH 4 DAYS  Final   Report Status PENDING  Incomplete    Anti-infectives    Start     Dose/Rate Route Frequency Ordered Stop   12/25/14 1000  levofloxacin (LEVAQUIN) IVPB 500 mg  Status:  Discontinued     500 mg 100 mL/hr over 60 Minutes Intravenous Every 48 hours 12/23/14 0830 12/25/14 0838    12/24/14 1000  aztreonam (AZACTAM) 1 g in dextrose 5 % 50 mL IVPB  Status:  Discontinued     1 g 100 mL/hr over 30 Minutes Intravenous Every 24 hours 12/23/14 0956 12/27/14 0700   12/23/14 1815  vancomycin (VANCOCIN) 500 mg in sodium chloride 0.9 % 100 mL IVPB     500 mg 100 mL/hr over 60 Minutes Intravenous  Once 12/23/14 1813 12/23/14 2006   12/23/14 1800  vancomycin (VANCOCIN) IVPB 750 mg/150 ml premix     750 mg 150 mL/hr over 60 Minutes Intravenous Every M-W-F (Hemodialysis) 12/23/14 0955     12/23/14 1315  vancomycin (VANCOCIN) 1,250 mg in sodium chloride 0.9 % 250 mL IVPB  Status:  Discontinued     1,250 mg 166.7 mL/hr over 90 Minutes Intravenous  Once 12/23/14 1305 12/23/14 1813   12/23/14 0830  levofloxacin (LEVAQUIN) IVPB 750 mg     750 mg 100 mL/hr over 90 Minutes Intravenous  Once 12/23/14 0823 12/23/14 1241   12/23/14 0830  aztreonam (AZACTAM) 2 g in dextrose 5 % 50 mL IVPB     2 g 100 mL/hr over 30 Minutes Intravenous  Once 12/23/14 0823 12/23/14 1020   12/23/14 0830  vancomycin (VANCOCIN) IVPB 1000 mg/200 mL premix  Status:  Discontinued     1,000 mg 200 mL/hr over 60 Minutes Intravenous  Once 12/23/14 0823 12/23/14 0826   12/23/14 0830  vancomycin (VANCOCIN) 1,250 mg in sodium chloride 0.9 % 250 mL IVPB  Status:  Discontinued     1,250 mg 166.7 mL/hr over 90 Minutes Intravenous  Once 12/23/14 0826 12/23/14 1305      Assessment 68 YOM with right groin wound. Currently being treated with vancomycin day #6 (3rd dose today) WBC 2.5 / afebrile  Aztreonam>>7/30>8/2 Vanc>>7/29 Levo>>7/29 x 1  7/28 MRSA PCR>>neg  7/29 BCx x2>>ngtd  7/29 wound Cx>>MRSA   Goal of Therapy:  Pre-hemodialysis vanc trough if warranted  Plan:  Aztreonam D/C'd  Continue Vanc 750mg  IV MWF with hemo for a total of 6 doses per Dr. Melton Alar C. Lennox Grumbles, PharmD Pharmacy Resident  Pager: [phone] 12/28/2014 11:55 AM

## 2014-12-29 ENCOUNTER — Other Ambulatory Visit: Payer: Self-pay | Admitting: *Deleted

## 2014-12-29 DIAGNOSIS — K5641 Fecal impaction: Secondary | ICD-10-CM

## 2014-12-29 LAB — TYPE AND SCREEN
ABO/RH(D): O POS
Antibody Screen: NEGATIVE
UNIT DIVISION: 0

## 2014-12-29 LAB — CBC
HCT: 30.3 % — ABNORMAL LOW (ref 39.0–52.0)
Hemoglobin: 9.9 g/dL — ABNORMAL LOW (ref 13.0–17.0)
MCH: 29 pg (ref 26.0–34.0)
MCHC: 32.7 g/dL (ref 30.0–36.0)
MCV: 88.9 fL (ref 78.0–100.0)
Platelets: 50 10*3/uL — ABNORMAL LOW (ref 150–400)
RBC: 3.41 MIL/uL — AB (ref 4.22–5.81)
RDW: 18.4 % — ABNORMAL HIGH (ref 11.5–15.5)
WBC: 5 10*3/uL (ref 4.0–10.5)

## 2014-12-29 LAB — PROTIME-INR
INR: 1.65 — ABNORMAL HIGH (ref 0.00–1.49)
Prothrombin Time: 19.5 seconds — ABNORMAL HIGH (ref 11.6–15.2)

## 2014-12-29 MED ORDER — WARFARIN SODIUM 4 MG PO TABS
4.0000 mg | ORAL_TABLET | Freq: Once | ORAL | Status: AC
  Administered 2014-12-29: 4 mg via ORAL
  Filled 2014-12-29: qty 1

## 2014-12-29 NOTE — Evaluation (Signed)
Physical Therapy Evaluation Patient Details Name: Brandon Sharp MRN: [id number] DOB: 06-06-1945 Today's Date: 12/29/2014   History of Present Illness  69 year old man with significant past medical history for ESRD with HD MWF, hypertension, diabetes, heart transplant with recent right groin hematoma evacuation 6/27, PVD  who presented with severe Abdominal pain, sepsis and fecal impactiont Rt thigh dialysis graft placed and found to have Large Rt groin hematoma. 6/27 evacuation of hematoma with VAC placed  Clinical Impression  Pt very pleasant and had been using RW and ambulating limited distances at home. Pt reports generalized fatigue from not being OOB. Pt will benefit from acute therapy to maximize mobility, function, gait and strength to decrease burden of care. Recommend daily ambulation and OOB with nursing assist.      Follow Up Recommendations Home health PT;Supervision for mobility/OOB    Equipment Recommendations  None recommended by PT    Recommendations for Other Services       Precautions / Restrictions Precautions Precautions: Fall      Mobility  Bed Mobility Overal bed mobility: Modified Independent                Transfers     Transfers: Sit to/from Stand Sit to Stand: Supervision         General transfer comment: cues for hand placement  Ambulation/Gait Ambulation/Gait assistance: Supervision Ambulation Distance (Feet): 100 Feet Assistive device: Rolling walker (2 wheeled) Gait Pattern/deviations: Step-through pattern;Decreased stride length;Trunk flexed   Gait velocity interpretation: Below normal speed for age/gender General Gait Details: steady gait with RW with decreased distance due to fatigue  Stairs            Wheelchair Mobility    Modified Rankin (Stroke Patients Only)       Balance                                             Pertinent Vitals/Pain Pain Assessment: No/denies pain    Home  Living Family/patient expects to be discharged to:: Private residence Living Arrangements: Spouse/significant other Available Help at Discharge: Available 24 hours/day Type of Home: House Home Access: Stairs to enter Entrance Stairs-Rails: None Entrance Stairs-Number of Steps: 2 Home Layout: One level Home Equipment: Cane - single point;Shower seat;Walker - 2 wheels;Wheelchair - manual      Prior Function Level of Independence: Needs assistance   Gait / Transfers Assistance Needed: mod I with RW for gait with distance limited to <300', assist for stairs  ADL's / Homemaking Assistance Needed: performs ADLs on his own wife does the housework and cooking        Hand Dominance        Extremity/Trunk Assessment   Upper Extremity Assessment: Overall WFL for tasks assessed           Lower Extremity Assessment: RLE deficits/detail;LLE deficits/detail RLE Deficits / Details: hip flexion 4/5, knee extension 4+/5 LLE Deficits / Details: hip flexion 4/5, knee extension 4+/5     Communication   Communication: No difficulties  Cognition Arousal/Alertness: Awake/alert Behavior During Therapy: WFL for tasks assessed/performed Overall Cognitive Status: Within Functional Limits for tasks assessed                      General Comments      Exercises General Exercises - Lower Extremity Long Arc Quad: AROM;Seated;Both;10 reps Hip Flexion/Marching: AROM;Seated;Both;15 reps  Assessment/Plan    PT Assessment Patient needs continued PT services  PT Diagnosis Difficulty walking;Generalized weakness   PT Problem List Decreased strength;Decreased activity tolerance;Decreased balance;Decreased mobility  PT Treatment Interventions DME instruction;Gait training;Stair training;Functional mobility training;Therapeutic activities;Therapeutic exercise;Balance training;Patient/family education   PT Goals (Current goals can be found in the Care Plan section) Acute Rehab PT  Goals Patient Stated Goal: to return home PT Goal Formulation: With patient/family Time For Goal Achievement: 01/12/15 Potential to Achieve Goals: Good    Frequency Min 3X/week   Barriers to discharge        Co-evaluation               End of Session Equipment Utilized During Treatment: Gait belt Activity Tolerance: Patient tolerated treatment well Patient left: in chair;with call bell/phone within Sharp;with family/visitor present Nurse Communication: Mobility status         Time: 3491-7915 PT Time Calculation (min) (ACUTE ONLY): 22 min   Charges:   PT Evaluation $Initial PT Evaluation Tier I: 1 Procedure     PT G CodesMelford Sharp 12/29/2014, 12:28 PM  Brandon Sharp, Brandon Sharp

## 2014-12-29 NOTE — Progress Notes (Signed)
PROGRESS NOTE  CATO LIBURD ERX:[id number] DOB: [birth year] DOA: 12/22/2014 PCP: [name], MD  HPI: 69 year old male with ESRD on HD, type 2 diabetes mellitus, HTN, peripheral vascular disease, DVT on chronic anticoagulation, CHF s/p heart transplant presented to the ED for constipation x 1 week. He had been in a SNIF until 7/20 where he was discharge home after being hospitalized for ESRD with symptomatic anemia. He has not passed stool for one week, but has passed flatus. He complains of abdominal pain that was worsening 24 hours prior to admission and described as a cramping. Manual disimpaction and a smog enema were attempted in the ED, which the patient did not tolerate. Patient denies nausea, vomiting, diarrhea, chest pain, shortness of breath, melena, hematochezia, and urinary symptoms.   Subjective / 24 H Interval events - no chest pain, shortness of breath, no abdominal pain, nausea or vomiting. Seen in HD  Assessment/Plan: Principal Problem:   Sepsis Active Problems:   DVT (deep venous thrombosis)   Heart transplanted   CKD (chronic kidney disease), stage V   ESRD on hemodialysis   Constipation   Fecal impaction   Pressure ulcer  Sepsis of unclear source, possible MRSA skin infection - sepsis physiology improving, initially on broad-spectrum antibiotics with vancomycin and aztreonam, discontinued levofloxacin 7/31 due to elevated INR. His blood cultures have remained negative, he is growing MRSA from his right groin wound - now on Vancomycin day 6, will get Vanc with HD, plan for total of 14 days  Anemia due to chronic kidney disease / worsening in the setting of sepsis - Patient did require one unit of packed red blood cells for hemoglobin of 6.4 on 7/28 - This is likely in the setting of sepsis, no bleeding noted, his hemoglobin has improved after the blood transfusion and has remained overall stable. - patient with a stool 2 days ago with a small streak of blood.  Likely due to severe constipation on admission. Without recurrence. Initial FOBT negative.   Thrombocytopenia / leukopenia - has been chronically low, no bleeding - sirolimus level 2.6  Constipation/Fecal Impaction - Per patient, has been constipated x 1 week - CT abdomen pelvis demonstrated increased stool burden without bowel obstruction  - This has resolved  S/P heart transplant - Transplant 07/10/[facility] at [facility].  - On imuran and prednisone - Echo 07/2013: LVEF 55-60%, no wall motion abnormalities  Hx of DVT, on chronic anticoagulation - continue Coumadin  Hypertension - On carvedilol, continue to monitor   ESRD - History of renal transplant and rejection  - On Hemodialysis M/W/F - nephrology following, HD today, if tolerates using AVG will pull HD cath   Type 2 Diabetes Mellitus - Controlled with short acting insulin  Weakness - PT evaluation pending   Diet: Diet renal with fluid restriction Fluid restriction:: 1200 mL Fluid; Room service appropriate?: Yes; Fluid consistency:: Thin Fluids: NS DVT Prophylaxis: Heparin  Code Status: DNR Family Communication: Patient Disposition Plan: Remain inpatient, pending PT evaluation, d/c tomorrow after HD  Consultants:  Vascular surgery  Nephrology   Antibiotics   Levofloxacin 12/23/14 >>12/25/14  Vancomycin 12/23/14 >>   Aztreonam 12/23/14 >>8/1   Studies  No results found. Objective  Filed Vitals:   12/28/14 1424 12/28/14 2110 12/29/14 0423 12/29/14 0812  BP: 137/53 112/49 99/39 108/53  Pulse: 86 87 89 88  Temp: 97.9 F (36.6 C) 100.2 F (37.9 C) 99.5 F (37.5 C) 98.5 F (36.9 C)  TempSrc: Oral Oral Oral Oral  Resp:  18 19 20 18   Height:      Weight:  76.386 kg (168 lb 6.4 oz)    SpO2: 100% 99% 99% 99%    Intake/Output Summary (Last 24 hours) at 12/29/14 1306 Last data filed at 12/29/14 0945  Gross per 24 hour  Intake    720 ml  Output   2000 ml  Net  -1280 ml   Filed Weights   12/28/14 0900  12/28/14 1330 12/28/14 2110  Weight: 78.5 kg (173 lb 1 oz) 76.9 kg (169 lb 8.5 oz) 76.386 kg (168 lb 6.4 oz)   Exam:  General:  Patient laying in bed in no acute distress, undergoing HD.  HEENT: no scleral icterus  Cardiovascular: RRR without MRG, 2+ peripheral pulses, no edema  Respiratory: CTA biL, good air movement, no wheezing, no crackles, no rales  Abdomen: soft, non tender, BS +, no guarding  MSK/Extremities: no clubbing/cyanosis, no joint swelling, right thigh wound without drainage, dressing clean dry and intact.  Data Reviewed: Basic Metabolic Panel:  Recent Labs Lab 12/25/14 0331 12/26/14 0250 12/26/14 2315 12/27/14 0635 12/28/14 0545  NA 138 137 139 141 139  K 4.2 4.1 4.9 3.1* 3.1*  CL 104 104 101 100* 103  CO2 27 25 28  32 30  GLUCOSE 102* 86 105* 87 112*  BUN 26* 38* 12 13 23*  CREATININE 5.22* 6.50* 2.90* 3.32* 4.96*  CALCIUM 8.8* 8.9 7.8* 7.9* 8.3*  PHOS  --  2.7 2.0* 1.7* 2.3*   Liver Function Tests:  Recent Labs Lab 12/26/14 0250 12/26/14 2315 12/27/14 0635 12/28/14 0545  ALBUMIN 2.4* 2.2* 2.1* 2.1*   CBC:  Recent Labs Lab 12/25/14 0331 12/26/14 0250 12/26/14 2040 12/27/14 0635 12/27/14 1110 12/28/14 0545  WBC 3.4* 2.9* 2.6* 2.3*  --  2.5*  NEUTROABS  --   --   --   --   --  0.6*  HGB 8.0* 8.2* 7.8* 7.1* 7.0* 7.8*  HCT 25.4* 25.5* 23.1* 22.1* 22.0* 24.2*  MCV 91.7 92.1 88.2 92.1  --  90.6  PLT 72* 78* 53* 63*  --  55*    ProBNP (last 3 results)  Recent Labs  03/27/14 1747 04/12/14 0105  PROBNP 37853.0* 45045.0*    Recent Results (from the past 240 hour(s))  MRSA PCR Screening     Status: None   Collection Time: 12/22/14 12:37 PM  Result Value Ref Range Status   MRSA by PCR NEGATIVE NEGATIVE Final    Comment:        The GeneXpert MRSA Assay (FDA approved for NASAL specimens only), is one component of a comprehensive MRSA colonization surveillance program. It is not intended to diagnose MRSA infection nor to guide  or monitor treatment for MRSA infections.   Wound culture     Status: None   Collection Time: 12/23/14  8:24 AM  Result Value Ref Range Status   Specimen Description WOUND RIGHT GROIN  Final   Special Requests Normal  Final   Gram Stain   Final    RARE WBC PRESENT, PREDOMINANTLY PMN FEW SQUAMOUS EPITHELIAL CELLS PRESENT FEW GRAM POSITIVE COCCI IN PAIRS Performed at [facility]    Culture   Final    MODERATE METHICILLIN RESISTANT STAPHYLOCOCCUS AUREUS Note: RIFAMPIN AND GENTAMICIN SHOULD NOT BE USED AS SINGLE DRUGS FOR TREATMENT OF STAPH INFECTIONS. This organism DOES NOT demonstrate inducible Clindamycin resistance in vitro. CRITICAL RESULT CALLED TO, READ BACK BY AND VERIFIED WITH: REBECCA G 8/1  @216  BY REAMM Performed  at [facility]    Report Status 12/26/2014 FINAL  Final   Organism ID, Bacteria METHICILLIN RESISTANT STAPHYLOCOCCUS AUREUS  Final      Susceptibility   Methicillin resistant staphylococcus aureus - MIC*    CLINDAMYCIN <=0.25 SENSITIVE Sensitive     ERYTHROMYCIN >=8 RESISTANT Resistant     GENTAMICIN <=0.5 SENSITIVE Sensitive     LEVOFLOXACIN <=0.12 SENSITIVE Sensitive     OXACILLIN >=4 RESISTANT Resistant     RIFAMPIN <=0.5 SENSITIVE Sensitive     TRIMETH/SULFA <=10 SENSITIVE Sensitive     VANCOMYCIN <=0.5 SENSITIVE Sensitive     TETRACYCLINE <=1 SENSITIVE Sensitive     * MODERATE METHICILLIN RESISTANT STAPHYLOCOCCUS AUREUS  Culture, blood (x 2)     Status: None   Collection Time: 12/23/14 10:10 AM  Result Value Ref Range Status   Specimen Description BLOOD LEFT HAND  Final   Special Requests IN PEDIATRIC BOTTLE 1CC  Final   Culture NO GROWTH 5 DAYS  Final   Report Status 12/28/2014 FINAL  Final  Culture, blood (x 2)     Status: None   Collection Time: 12/23/14 10:50 AM  Result Value Ref Range Status   Specimen Description BLOOD LEFT HAND  Final   Special Requests IN PEDIATRIC BOTTLE 3CC  Final   Culture NO GROWTH 5 DAYS  Final    Report Status 12/28/2014 FINAL  Final    Scheduled Meds: . sodium chloride   Intravenous Once  . sodium chloride   Intravenous Once  . atorvastatin  40 mg Oral Daily  . azaTHIOprine  75 mg Oral q morning - 10a  . carvedilol  6.25 mg Oral QHS  . colesevelam  3,750 mg Oral Daily  . darbepoetin (ARANESP) injection - DIALYSIS  200 mcg Intravenous Q Wed-HD  . doxercalciferol  3 mcg Intravenous Q M,W,F-HD  . famotidine  10 mg Oral QHS  . feeding supplement  1 Container Oral TID BM  . isosorbide dinitrate  40 mg Oral TID  . multivitamin  1 tablet Oral QHS  . predniSONE  5 mg Oral Q breakfast  . senna-docusate  3 tablet Oral BID  . Sirolimus  3 tablet Oral Daily  . sodium chloride  500 mL Intravenous Once  . vancomycin  750 mg Intravenous Q M,W,F-HD  . warfarin  3 mg Oral ONCE-1800  . Warfarin - Pharmacist Dosing Inpatient   Does not apply [id number]   Marzetta Board, MD [facility] Pager [phone]. If 7 PM - 7 AM, please contact night-coverage at [email or web address], password [facility] 12/29/2014, 1:06 PM  LOS: 6 days

## 2014-12-29 NOTE — Progress Notes (Signed)
Subjective:   Feeling better. hasnt worked with PT yet. Wants to stay for HD tomorrow AM then DC  Objective Filed Vitals:   12/28/14 1424 12/28/14 2110 12/29/14 0423 12/29/14 0812  BP: 137/53 112/49 99/39 108/53  Pulse: 86 87 89 88  Temp: 97.9 F (36.6 C) 100.2 F (37.9 C) 99.5 F (37.5 C) 98.5 F (36.9 C)  TempSrc: Oral Oral Oral Oral  Resp: 18 19 20 18   Height:      Weight:  76.386 kg (168 lb 6.4 oz)    SpO2: 100% 99% 99% 99%   Physical Exam General: alert and oriented. No acute distress. Eating breakfast Heart: RRR Lungs: CTA, unlabored Abdomen: soft, nontender +BS  Extremities:upper ext edema.  Dialysis Access: R thigh AVG +b/t   Cath removed 8/3  Dialysis Orders: Center: [facility] on MWF . EDW 68 kg HD Bath 2.0 K 2.0 Ca Time 4 hours No Heparin. Access LIJ per cath BFR 400 DFR Autoflow 1.5 Linear sodium  Aranesp 200 mcg IV (last dose 12/21/2014) Hgb 8.2 7/22 and 7.2 7/27 Hectoral 3 mcg IV MWF (Last dose 12/21/2014)  Assessment/Plan: 1. Sepsis - source unknown VVS,- wound culture +MRSA on Vanc, blood cultures no growth CXR neg; Afebrile/ HD cath removed 2. ESRD - MWF - no heparin HD - HD pending tomorrow 3. Anemia - Hgb 7.8 yesterday--heme neg; Aranesp 200 - last given 7/27 -has now received 3 u RBC  4. Secondary hyperparathyroidism - continue hectorol. phoslo DCd for phos 2.3 5. BP/volume - BP better- had been hypotensive, small right effusion on CXR - attempt to get standing wts 6. Nutrition - alb 2.1 renal diet. Brandon Sharp bid/vits- 7. Constipation - per primary 8. Hx heart tx, failed renal tx - continue current immunosuprresive tx  9. Hx DVT on coumadin INR jumped from 3.8 to 6- 1.56 today 10. Thrombocytopenia - acute drop likely related to infx  11. Leukopenia-  sirolimus level 2.6 12. Severe PAD/Right LE pain- xray neg tib/fib for fx- pain may be related to PAD- no pain today 13. DNR  Shelle Iron, NP [place]  [phone] 12/29/2014,9:34 AM  LOS: 6 days    Additional Objective Labs: Basic Metabolic Panel:  Recent Labs Lab 12/26/14 2315 12/27/14 0635 12/28/14 0545  NA 139 141 139  K 4.9 3.1* 3.1*  CL 101 100* 103  CO2 28 32 30  GLUCOSE 105* 87 112*  BUN 12 13 23*  CREATININE 2.90* 3.32* 4.96*  CALCIUM 7.8* 7.9* 8.3*  PHOS 2.0* 1.7* 2.3*   Liver Function Tests:  Recent Labs Lab 12/26/14 2315 12/27/14 0635 12/28/14 0545  ALBUMIN 2.2* 2.1* 2.1*   No results for input(s): LIPASE, AMYLASE in the last 168 hours. CBC:  Recent Labs Lab 12/25/14 0331 12/26/14 0250 12/26/14 2040 12/27/14 0635 12/27/14 1110 12/28/14 0545  WBC 3.4* 2.9* 2.6* 2.3*  --  2.5*  NEUTROABS  --   --   --   --   --  0.6*  HGB 8.0* 8.2* 7.8* 7.1* 7.0* 7.8*  HCT 25.4* 25.5* 23.1* 22.1* 22.0* 24.2*  MCV 91.7 92.1 88.2 92.1  --  90.6  PLT 72* 78* 53* 63*  --  55*   Blood Culture    Component Value Date/Time   SDES BLOOD LEFT HAND 12/23/2014 1050   SPECREQUEST IN PEDIATRIC BOTTLE 3CC 12/23/2014 1050   CULT NO GROWTH 5 DAYS 12/23/2014 1050   REPTSTATUS 12/28/2014 FINAL 12/23/2014 1050    Medications:   . sodium chloride  Intravenous Once  . sodium chloride   Intravenous Once  . atorvastatin  40 mg Oral Daily  . azaTHIOprine  75 mg Oral q morning - 10a  . carvedilol  6.25 mg Oral QHS  . colesevelam  3,750 mg Oral Daily  . darbepoetin (ARANESP) injection - DIALYSIS  200 mcg Intravenous Q Wed-HD  . doxercalciferol  3 mcg Intravenous Q M,W,F-HD  . famotidine  10 mg Oral QHS  . feeding supplement  1 Container Oral TID BM  . isosorbide dinitrate  40 mg Oral TID  . multivitamin  1 tablet Oral QHS  . predniSONE  5 mg Oral Q breakfast  . senna-docusate  3 tablet Oral BID  . Sirolimus  3 tablet Oral Daily  . sodium chloride  500 mL Intravenous Once  . vancomycin  750 mg Intravenous Q M,W,F-HD  . warfarin  3 mg Oral ONCE-1800  . Warfarin - Pharmacist Dosing Inpatient   Does not apply q1800     I  have seen and examined this patient and agree with plan as outlined by Shelle Iron, NP.  Awaiting PT/OT evaluation for disposition.  Pt is anxious to go home but not very mobile/strong. Caiden Monsivais A,MD 12/29/2014 11:03 AM

## 2014-12-29 NOTE — Care Management Note (Signed)
Case Management Note  Patient Details  Name: Brandon Sharp MRN: [id number] Date of Birth: 18-Sep-1945  Subjective/Objective:             CM following for progression and d/c planning.       Action/Plan: 12/29/2014 Noted consult re VAC that pt has in home, this CM spoke with pt and wife who state that the [facility] was ordered while an inpt and the pt d/c to rehab. The hospital [facility] was d/c and the home [facility] was sent home with the pt at the time of d/c from [facility] and was never used at home. [facility] has been providing [facility] for dressing changes and was unaware that the [facility] was in the home as it was not ordered to be used at the time of d/c. This CM contacted [facility] who will follow this hospitalization and make arrangements to pick up the [facility] from the pt home when he is d/c this time.  Expected Discharge Date:                  Expected Discharge Plan:  [place]  In-House Referral:  NA  Discharge planning Services  CM Consult  Post Acute Care Choice:    Choice offered to:     DME Arranged:    DME Agency:     HH Arranged:    HH Agency:     Status of Service:  In process, will continue to follow  Medicare Important Message Given:  Yes-second notification given Date Medicare IM Given:    Medicare IM give by:    Date Additional Medicare IM Given:    Additional Medicare Important Message give by:     If discussed at [place] of Stay Meetings, dates discussed:    Additional Comments:  Adron Bene, RN 12/29/2014, 4:28 PM

## 2014-12-30 ENCOUNTER — Ambulatory Visit: Payer: Medicare Other | Admitting: Vascular Surgery

## 2014-12-30 ENCOUNTER — Encounter (HOSPITAL_COMMUNITY)
Admission: EM | Disposition: A | Discharge status: Home Health | Payer: Self-pay | Source: Home / Self Care | Attending: Internal Medicine

## 2014-12-30 HISTORY — PX: FLEXIBLE SIGMOIDOSCOPY: SHX5431

## 2014-12-30 LAB — CBC
HCT: 26.5 % — ABNORMAL LOW (ref 39.0–52.0)
HEMOGLOBIN: 8.6 g/dL — AB (ref 13.0–17.0)
MCH: 28.9 pg (ref 26.0–34.0)
MCHC: 32.5 g/dL (ref 30.0–36.0)
MCV: 88.9 fL (ref 78.0–100.0)
Platelets: 49 10*3/uL — ABNORMAL LOW (ref 150–400)
RBC: 2.98 MIL/uL — AB (ref 4.22–5.81)
RDW: 18.3 % — AB (ref 11.5–15.5)
WBC: 2.8 10*3/uL — ABNORMAL LOW (ref 4.0–10.5)

## 2014-12-30 LAB — RENAL FUNCTION PANEL
ALBUMIN: 2.2 g/dL — AB (ref 3.5–5.0)
ANION GAP: 11 (ref 5–15)
BUN: 20 mg/dL (ref 6–20)
CHLORIDE: 97 mmol/L — AB (ref 101–111)
CO2: 28 mmol/L (ref 22–32)
Calcium: 8.3 mg/dL — ABNORMAL LOW (ref 8.9–10.3)
Creatinine, Ser: 5.03 mg/dL — ABNORMAL HIGH (ref 0.61–1.24)
GFR calc Af Amer: 12 mL/min — ABNORMAL LOW (ref >60)
GFR, EST NON AFRICAN AMERICAN: 11 mL/min — AB (ref >60)
Glucose, Bld: 111 mg/dL — ABNORMAL HIGH (ref 65–99)
Phosphorus: 2.7 mg/dL (ref 2.5–4.6)
Potassium: 2.7 mmol/L — CL (ref 3.5–5.1)
Sodium: 136 mmol/L (ref 135–145)

## 2014-12-30 LAB — PROTIME-INR
INR: 1.83 — ABNORMAL HIGH (ref 0.00–1.49)
Prothrombin Time: 21.1 seconds — ABNORMAL HIGH (ref 11.6–15.2)

## 2014-12-30 SURGERY — SIGMOIDOSCOPY, FLEXIBLE

## 2014-12-30 MED ORDER — POLYETHYLENE GLYCOL 3350 17 G PO PACK
17.0000 g | PACK | Freq: Every day | ORAL | Status: DC
  Administered 2014-12-30 – 2014-12-31 (×2): 17 g via ORAL
  Filled 2014-12-30 (×2): qty 1

## 2014-12-30 MED ORDER — DOXERCALCIFEROL 4 MCG/2ML IV SOLN
INTRAVENOUS | Status: AC
  Filled 2014-12-30: qty 2

## 2014-12-30 MED ORDER — WARFARIN SODIUM 4 MG PO TABS
4.0000 mg | ORAL_TABLET | Freq: Once | ORAL | Status: AC
  Administered 2014-12-30: 4 mg via ORAL
  Filled 2014-12-30: qty 1

## 2014-12-30 MED ORDER — POTASSIUM CHLORIDE CRYS ER 20 MEQ PO TBCR
40.0000 meq | EXTENDED_RELEASE_TABLET | Freq: Once | ORAL | Status: AC
  Administered 2014-12-30: 40 meq via ORAL
  Filled 2014-12-30: qty 2

## 2014-12-30 MED ORDER — SODIUM CHLORIDE 0.9 % IV SOLN: INTRAVENOUS | Status: DC

## 2014-12-30 MED ORDER — DARBEPOETIN ALFA 200 MCG/0.4ML IJ SOSY
PREFILLED_SYRINGE | INTRAMUSCULAR | Status: AC
  Filled 2014-12-30: qty 0.4

## 2014-12-30 NOTE — Op Note (Signed)
[facility] [place], [id number]   FLEXIBLE SIGMOIDOSCOPY PROCEDURE REPORT  PATIENT: Brandon Sharp, Brandon Sharp  MR#: [id number] BIRTHDATE: 06/30/1945 , 73  yrs. old GENDER: male ENDOSCOPIST: Ronald Lobo, MD REFERRED BY:  Dr. Dillon Bjork PROCEDURE DATE:  12/30/2014 PROCEDURE:   Flexible sigmoidoscopy ASA CLASS:    III INDICATIONS:  Rectal bleeding MEDICATIONS: None  DESCRIPTION OF PROCEDURE:   After the risks benefits and alternatives of the procedure were thoroughly explained, informed consent was obtained.  Digital exam disclosed no masses and no stool present in the rectum, and no blood on the examining glove.      The Pentax video pediatric colonoscope     was introduced through the anus  and advanced to 25 cm      , The exam was Without limitations.    The quality of the prep was satisfactory, though no prep was administered.      . Estimated blood loss is zero unless otherwise noted in this procedure report. The instrument was then slowly withdrawn as the mucosa was fully examined.       There was no free blood in the colonic lumen.  The rectal mucosa was significantly inflamed in a patchy pattern, characterized by being erythematous and somewhat hemorrhagic in appearance, with some pinpoint hemorrhages here and there. There was perhaps minimal friability. The normal vascular pattern of the rectal mucosa was absent. There were at least 4 discrete shallow rather broad ulcers measuring about 15 mm scattered in the distal rectum, including a serpiginous one in the anal canal. These were consistent in appearance with stercoral ulcerations.  Advancing the scope proximally in the colon, to a level of about 25 cm, there was formed brown stool, without any blood whatsoever, and normal-appearing mucosa with a normal vascular pattern. The patient did have significant diverticulosis, as previously noted.  I elected not to perform  biopsies because of the patient's thrombocytopenia and elevated INR, on Coumadin.  Retroflexion was otherwise unremarkable.         The scope was then withdrawn from the patient and the procedure terminated.  COMPLICATIONS: There were no immediate complications.  ENDOSCOPIC IMPRESSION: Significant rectal inflammation and discrete ulcerations, possibly stercoral ulcerations related to his recent constipation and fecal impaction, although rectal mucosal ischemia would be another possibility.  RECOMMENDATIONS: I would favor repeat sigmoidoscopy in about 5-7 days to assess for mucosal healing or, conversely, persistence of inflammation which might imply an ongoing ischemic process.  REPEAT EXAM: to be arranged  eSigned:  Ronald Lobo, MD 12/30/2014 5:08 PM   CC:  PATIENT NAME:  Brandon Sharp, Brandon Sharp MR#: [id number]

## 2014-12-30 NOTE — Procedures (Signed)
I have personally attended this patient's dialysis session.   Pt on 4K bath (K 2.7) (and to get 40 mEq po K as well) Weight discrepancy - have asked for standing weight post HD (EDW 72, pre bed wt 76) Ri thigh AVG running at 400. Venous side required 2 cannulations. BP stable. HR 80's.  [name], MD [facility] [phone] Pager 12/30/2014, 9:37 AM

## 2014-12-30 NOTE — Progress Notes (Signed)
CRITICAL VALUE ALERT  Critical value received:  Potassium level 2.7  Date of notification:  12/30/14  Time of notification:  06:02  Critical value read back:Yes.    Nurse who received alert:  Vinie Sill  MD notified (1st page):  L. Harduk,PA-C  Time of first page:  06:07  MD notified (2nd page):  Time of second page:  Responding MD: Dierdre Forth  Time MD responded:  06:18

## 2014-12-30 NOTE — Progress Notes (Signed)
PROGRESS NOTE  Brandon Sharp IWP:[id number] DOB: [birth year] DOA: 12/22/2014 PCP: [name], MD  HPI: 69 year old male with ESRD on HD, type 2 diabetes mellitus, HTN, peripheral vascular disease, DVT on chronic anticoagulation, CHF s/p heart transplant presented to the ED for constipation x 1 week. He had been in a SNIF until 7/20 where he was discharge home after being hospitalized for ESRD with symptomatic anemia. He has not passed stool for one week, but has passed flatus. He complains of abdominal pain that was worsening 24 hours prior to admission and described as a cramping. Manual disimpaction and a smog enema were attempted in the ED, which the patient did not tolerate. Patient denies nausea, vomiting, diarrhea, chest pain, shortness of breath, melena, hematochezia, and urinary symptoms.   Subjective / 24 H Interval events - no chest pain, shortness of breath, no abdominal pain, nausea or vomiting. Seen in HD  Assessment/Plan: Principal Problem:   Sepsis Active Problems:   DVT (deep venous thrombosis)   Heart transplanted   CKD (chronic kidney disease), stage V   ESRD on hemodialysis   Constipation   Fecal impaction   Pressure ulcer  Sepsis of unclear source, possible MRSA skin infection - sepsis physiology improving, initially on broad-spectrum antibiotics with vancomycin and aztreonam, discontinued levofloxacin 7/31 due to elevated INR. His blood cultures have remained negative, he is growing MRSA from his right groin wound - now on Vancomycin day 7, will get Vanc with HD, plan for total of 14 days  Constipation/Fecal Impaction/blood in stool - Per patient, has been constipated x 1 week - CT abdomen pelvis demonstrated increased stool burden without bowel obstruction  - This has resolved - patient with a stool 2 days ago with a small streak of blood. Likely due to severe constipation on admission. This recurred yesterday evening.  - GI consulted, I have spoke with  Dr. Cristina Gong, will undergo a flex sig this afternoon   Anemia due to chronic kidney disease / worsening in the setting of sepsis and rectal bleed - Patient did require one unit of packed red blood cells for hemoglobin of 6.4 on 7/28 - This is likely in the setting of sepsis, no bleeding noted, his hemoglobin has improved after the blood transfusion and has remained overall stable.  Thrombocytopenia / leukopenia - has been chronically low, no bleeding - sirolimus level 2.6  S/P heart transplant - Transplant 07/10/2009 at [facility].  - On imuran and prednisone - Echo 07/2013: LVEF 55-60%, no wall motion abnormalities  Hx of DVT, on chronic anticoagulation - continue Coumadin  Hypertension - On carvedilol, continue to monitor   ESRD - History of renal transplant and rejection  - On Hemodialysis M/W/F - nephrology following, HD today  Type 2 Diabetes Mellitus - Controlled with short acting insulin  Weakness - PT evaluation recommending HHPT   Diet: Diet regular Room service appropriate?: Yes; Fluid consistency:: Thin Fluids: NS DVT Prophylaxis: Heparin  Code Status: DNR Family Communication: wife in the room  Disposition Plan: Remain inpatient, d/c in am if CBC stable  Consultants:  Vascular surgery  Nephrology   Antibiotics   Levofloxacin 12/23/14 >>12/25/14  Vancomycin 12/23/14 >>   Aztreonam 12/23/14 >>8/1   Studies  No results found. Objective  Filed Vitals:   12/30/14 1200 12/30/14 1230 12/30/14 1235 12/30/14 1350  BP: 103/57 112/59 129/63 117/52  Pulse: 98 92  94  Temp:    97.7 F (36.5 C)  TempSrc:    Oral  Resp: 12  12  15  Height:      Weight:      SpO2:    100%    Intake/Output Summary (Last 24 hours) at 12/30/14 1426 Last data filed at 12/30/14 0815  Gross per 24 hour  Intake    840 ml  Output      1 ml  Net    839 ml   Filed Weights   12/28/14 2110 12/29/14 2007 12/30/14 0826  Weight: 76.386 kg (168 lb 6.4 oz) 76 kg (167 lb 8.8 oz) 79.7  kg (175 lb 11.3 oz)   Exam:  General:  Patient laying in bed in no acute distress, undergoing HD.  HEENT: no scleral icterus  Cardiovascular: RRR without MRG, 2+ peripheral pulses, no edema  Respiratory: CTA biL, good air movement, no wheezing, no crackles, no rales  Abdomen: soft, non tender, BS +, no guarding  MSK/Extremities: no clubbing/cyanosis, no joint swelling, right thigh wound without drainage, dressing clean dry and intact.  Neuro: non focal, strength 5/5 in all 4  Skin: no rashes  Psych: normal mood and affect  Data Reviewed: Basic Metabolic Panel:  Recent Labs Lab 12/26/14 0250 12/26/14 2315 12/27/14 0635 12/28/14 0545 12/30/14 0515  NA 137 139 141 139 136  K 4.1 4.9 3.1* 3.1* 2.7*  CL 104 101 100* 103 97*  CO2 25 28 32 30 28  GLUCOSE 86 105* 87 112* 111*  BUN 38* 12 13 23* 20  CREATININE 6.50* 2.90* 3.32* 4.96* 5.03*  CALCIUM 8.9 7.8* 7.9* 8.3* 8.3*  PHOS 2.7 2.0* 1.7* 2.3* 2.7   Liver Function Tests:  Recent Labs Lab 12/26/14 0250 12/26/14 2315 12/27/14 0635 12/28/14 0545 12/30/14 0515  ALBUMIN 2.4* 2.2* 2.1* 2.1* 2.2*   CBC:  Recent Labs Lab 12/26/14 2040 12/27/14 0635 12/27/14 1110 12/28/14 0545 12/29/14 1450 12/30/14 0515  WBC 2.6* 2.3*  --  2.5* 5.0 2.8*  NEUTROABS  --   --   --  0.6*  --   --   HGB 7.8* 7.1* 7.0* 7.8* 9.9* 8.6*  HCT 23.1* 22.1* 22.0* 24.2* 30.3* 26.5*  MCV 88.2 92.1  --  90.6 88.9 88.9  PLT 53* 63*  --  55* 50* 49*    ProBNP (last 3 results)  Recent Labs  03/27/14 1747 04/12/14 0105  PROBNP 37853.0* 45045.0*    Recent Results (from the past 240 hour(s))  MRSA PCR Screening     Status: None   Collection Time: 12/22/14 12:37 PM  Result Value Ref Range Status   MRSA by PCR NEGATIVE NEGATIVE Final    Comment:        The GeneXpert MRSA Assay (FDA approved for NASAL specimens only), is one component of a comprehensive MRSA colonization surveillance program. It is not intended to diagnose  MRSA infection nor to guide or monitor treatment for MRSA infections.   Wound culture     Status: None   Collection Time: 12/23/14  8:24 AM  Result Value Ref Range Status   Specimen Description WOUND RIGHT GROIN  Final   Special Requests Normal  Final   Gram Stain   Final    RARE WBC PRESENT, PREDOMINANTLY PMN FEW SQUAMOUS EPITHELIAL CELLS PRESENT FEW GRAM POSITIVE COCCI IN PAIRS Performed at [facility]    Culture   Final    MODERATE METHICILLIN RESISTANT STAPHYLOCOCCUS AUREUS Note: RIFAMPIN AND GENTAMICIN SHOULD NOT BE USED AS SINGLE DRUGS FOR TREATMENT OF STAPH INFECTIONS. This organism DOES NOT demonstrate inducible Clindamycin resistance in  vitro. CRITICAL RESULT CALLED TO, READ BACK BY AND VERIFIED WITH: REBECCA G 8/1  @216  BY REAMM Performed at [facility]    Report Status 12/26/2014 FINAL  Final   Organism ID, Bacteria METHICILLIN RESISTANT STAPHYLOCOCCUS AUREUS  Final      Susceptibility   Methicillin resistant staphylococcus aureus - MIC*    CLINDAMYCIN <=0.25 SENSITIVE Sensitive     ERYTHROMYCIN >=8 RESISTANT Resistant     GENTAMICIN <=0.5 SENSITIVE Sensitive     LEVOFLOXACIN <=0.12 SENSITIVE Sensitive     OXACILLIN >=4 RESISTANT Resistant     RIFAMPIN <=0.5 SENSITIVE Sensitive     TRIMETH/SULFA <=10 SENSITIVE Sensitive     VANCOMYCIN <=0.5 SENSITIVE Sensitive     TETRACYCLINE <=1 SENSITIVE Sensitive     * MODERATE METHICILLIN RESISTANT STAPHYLOCOCCUS AUREUS  Culture, blood (x 2)     Status: None   Collection Time: 12/23/14 10:10 AM  Result Value Ref Range Status   Specimen Description BLOOD LEFT HAND  Final   Special Requests IN PEDIATRIC BOTTLE 1CC  Final   Culture NO GROWTH 5 DAYS  Final   Report Status 12/28/2014 FINAL  Final  Culture, blood (x 2)     Status: None   Collection Time: 12/23/14 10:50 AM  Result Value Ref Range Status   Specimen Description BLOOD LEFT HAND  Final   Special Requests IN PEDIATRIC BOTTLE 3CC  Final   Culture  NO GROWTH 5 DAYS  Final   Report Status 12/28/2014 FINAL  Final  Cath Tip Culture     Status: None (Preliminary result)   Collection Time: 12/28/14  3:35 PM  Result Value Ref Range Status   Specimen Description CATH TIP  Final   Special Requests LEFT IJ  Final   Culture   Final    NO GROWTH 1 DAY Performed at [facility]    Report Status PENDING  Incomplete    Scheduled Meds: . sodium chloride   Intravenous Once  . sodium chloride   Intravenous Once  . atorvastatin  40 mg Oral Daily  . azaTHIOprine  75 mg Oral q morning - 10a  . carvedilol  6.25 mg Oral QHS  . colesevelam  3,750 mg Oral Daily  . darbepoetin (ARANESP) injection - DIALYSIS  200 mcg Intravenous Q Wed-HD  . doxercalciferol      . doxercalciferol  3 mcg Intravenous Q M,W,F-HD  . famotidine  10 mg Oral QHS  . feeding supplement  1 Container Oral TID BM  . isosorbide dinitrate  40 mg Oral TID  . multivitamin  1 tablet Oral QHS  . potassium chloride  40 mEq Oral Once  . predniSONE  5 mg Oral Q breakfast  . senna-docusate  3 tablet Oral BID  . Sirolimus  3 tablet Oral Daily  . sodium chloride  500 mL Intravenous Once  . vancomycin  750 mg Intravenous Q M,W,F-HD  . warfarin  4 mg Oral ONCE-1800  . Warfarin - Pharmacist Dosing Inpatient   Does not apply q1800   Time spent: 25 minutes, more than 50% bedside counseling/discussions with patient and his wife.   Marzetta Board, MD [facility] Pager [phone]. If 7 PM - 7 AM, please contact night-coverage at [email or web address], password [facility] 12/30/2014, 2:26 PM  LOS: 7 days

## 2014-12-30 NOTE — Consult Note (Signed)
Referring Provider:   Dr. Wardell Heath ([facility]) Primary Care Physician:  [name], MD Primary Gastroenterologist:  Dr. Michail Sermon  Reason for Consultation:  Rectal bleeding  HPI: Brandon Sharp is a 69 y.o. male with multiple medical issues including thrombocytopenia, heart transplant, and end-stage renal disease on dialysis who was admitted about a week ago with significant constipation, with difficulties achieving digital disimpaction in the emergency room, and minimal response to an enema.   He finally had a large hard bowel movement and had some rectal bleeding. The bleeding has occurred with bowel movements for the past couple of days, with a mild drop in hemoglobin which is probably more related to equilibration than rectal bleeding. He denies perianal pain.   Colonoscopy a year ago by Dr. Michail Sermon, for follow-up of colon polyps, disclosed numerous small to medium-size polyps and diffuse diverticulosis.   Past Medical History  Diagnosis Date  . Diabetes mellitus   . Hypertension   . Myocardial infarction 1985; 1990  . Angina   . Dysrhythmia   . DVT (deep venous thrombosis) ~ 12/2010    LLE  . Anemia   . Blood transfusion 06/1999    post heart transplant  . Peripheral vascular disease   . Renal failure     Hemodialysis MWF last 2 years, sse dr [name] nephrology, goes to [facility]  . DVT (deep venous thrombosis) 08/25/2014    Nearly occluded R IJ, and L subclavian DVT  . CHF (congestive heart failure) 2001    beffore transplant; had AICD that was explanted 07/19/99 following successful heart transplant  . Coronary artery disease     transplant cardiologist Dr. Corky Mull [facility])  . Pneumonia     Past Surgical History  Procedure Laterality Date  . Nephrectomy transplanted organ  2008  . Av fistula repair      rt. a=fore arm fistula  . Av fistula placement  ~ 01/2010    "this was my 2nd fistula placement"  . Heart transplant  06/1999   . Colonoscopy  03/17/2012    Procedure: COLONOSCOPY;  Surgeon: Lear Ng, MD;  Location: [facility];  Service: Endoscopy;  Laterality: N/A;  . Coronary artery bypass graft  1990    CABG X3  . Colonoscopy with propofol N/A 12/14/2013    Procedure: COLONOSCOPY WITH PROPOFOL;  Surgeon: Lear Ng, MD;  Location: [facility];  Service: Endoscopy;  Laterality: N/A;  . Video bronchoscopy Bilateral 04/14/2014    Procedure: VIDEO BRONCHOSCOPY WITH FLUORO;  Surgeon: Collene Gobble, MD;  Location: [place];  Service: Cardiopulmonary;  Laterality: Bilateral;  . Shuntogram Right 05/09/2014    Procedure: FISTULOGRAM;  Surgeon: Conrad Cramerton, MD;  Location: [facility] CATH LAB;  Service: Cardiovascular;  Laterality: Right;  . Coronary angioplasty with stent placement  1985; 1990    prior stents in the 80's and 90's then CABG; s/p heart transplant 2001 s/p BMS mid LAD and proximal RCA 04/21/13 [facility])  . Av fistula placement Right 10/20/2014    Procedure: INSERTION OF ARTERIOVENOUS (AV) GORE-TEX GRAFT THIGH;  Surgeon: Conrad Lantana, MD;  Location: [place];  Service: Vascular;  Laterality: Right;  . Hematoma evacuation Right 11/21/2014    Procedure: EVACUATION HEMATOMA Right Groin;  Surgeon: Conrad Parks, MD;  Location: [place];  Service: Vascular;  Laterality: Right;  . Application of wound vac Right 11/21/2014    Procedure: APPLICATION OF WOUND VAC Right Groin;  Surgeon: Conrad , MD;  Location:  [facility] OR;  Service: Vascular;  Laterality: Right;  . Peripheral vascular catheterization N/A 11/25/2014    Procedure: Abdominal Aortogram;  Surgeon: Elam Dutch, MD;  Location: [place] CV LAB;  Service: Cardiovascular;  Laterality: N/A;  . Lower extremity angiogram Right 11/25/2014    Procedure: Lower Extremity Angiogram;  Surgeon: Elam Dutch, MD;  Location: [place] CV LAB;  Service: Cardiovascular;  Laterality: Right;    Prior to Admission medications   Medication Sig Start Date End Date  Taking? Authorizing Provider  atorvastatin (LIPITOR) 40 MG tablet Take 40 mg by mouth daily.     Yes Historical Provider, MD  azaTHIOprine (IMURAN) 50 MG tablet Take 75 mg by mouth every morning.    Yes Historical Provider, MD  b complex-vitamin c-folic acid (NEPHRO-VITE) 0.8 MG TABS tablet Take 1 tablet by mouth at bedtime.   Yes Historical Provider, MD  calcium acetate (PHOSLO) 667 MG capsule Take 1 capsule (667 mg total) by mouth 2 (two) times daily with a meal. 12/14/14  Yes Ivan Anchors Love, PA-C  carvedilol (COREG) 6.25 MG tablet Take 1 tablet (6.25 mg total) by mouth at bedtime. 04/15/14  Yes Geradine Girt, DO  colesevelam (WELCHOL) 625 MG tablet Take 3,750 mg by mouth daily. Takes 6 tabs daily   Yes Historical Provider, MD  famotidine (PEPCID) 10 MG tablet Take 1 tablet (10 mg total) by mouth at bedtime. 12/14/14  Yes Ivan Anchors Love, PA-C  HUMALOG KWIKPEN 100 UNIT/ML SOPN Inject 4-10 Units into the skin 2 (two) times daily. Per sliding scale 11/17/12  Yes Historical Provider, MD  isosorbide dinitrate (ISOCHRON) 40 MG CR tablet Take 40 mg by mouth every 8 (eight) hours. 10/03/14  Yes Historical Provider, MD  oxyCODONE (OXY IR/ROXICODONE) 5 MG immediate release tablet Take 1 tablet (5 mg total) by mouth every 6 (six) hours as needed for moderate pain. 12/14/14  Yes Ivan Anchors Love, PA-C  predniSONE (DELTASONE) 5 MG tablet Take 1 tablet (5 mg total) by mouth daily with breakfast. 08/29/14  Yes Orson Eva, MD  senna-docusate (SENOKOT-S) 8.6-50 MG per tablet Take 3 tablets by mouth 2 (two) times daily. 12/14/14  Yes Ivan Anchors Love, PA-C  Sirolimus 0.5 MG TABS Take 3 tablets by mouth daily. 10/10/14  Yes Historical Provider, MD  warfarin (COUMADIN) 5 MG tablet Please consult: Inpatient pharmacy to continue dosing warfarin and monitor Patient taking differently: Take 5 mg by mouth daily. Please consult: Inpatient pharmacy to continue dosing warfarin and monitor 12/02/14  Yes Albertine Patricia, MD  B-D INS SYR  HALF-UNIT .3CC/31G 31G X 5/16" 0.3 ML MISC USE AS DIRECTED UP TO 2-4 TIMES A DAY 11/02/14   Historical Provider, MD  pantoprazole (PROTONIX) 40 MG tablet Take 1 tablet (40 mg total) by mouth daily. Patient not taking: Reported on 12/22/2014 12/02/14   Albertine Patricia, MD    Current Facility-Administered Medications  Medication Dose Route Frequency Provider Last Rate Last Dose  . 0.9 %  sodium chloride infusion   Intravenous Once Shelle Iron, NP      . 0.9 %  sodium chloride infusion   Intravenous Once Donato Heinz, MD      . 0.9 %  sodium chloride infusion   Intravenous Continuous Ronald Lobo, MD      . acetaminophen (TYLENOL) tablet 650 mg  650 mg Oral Q6H PRN Tanna Savoy Stinson, DO   650 mg at 12/24/14 7829   Or  . acetaminophen (TYLENOL) suppository 650 mg  650 mg Rectal Q6H PRN Tanna Savoy Stinson, DO      . alum & mag hydroxide-simeth (MAALOX/MYLANTA) 200-200-20 MG/5ML suspension 30 mL  30 mL Oral Q6H PRN Tanna Savoy Stinson, DO      . atorvastatin (LIPITOR) tablet 40 mg  40 mg Oral Daily Tanna Savoy Stinson, DO   40 mg at 12/30/14 1447  . azaTHIOprine (IMURAN) tablet 75 mg  75 mg Oral q morning - 10a Tanna Savoy Stinson, DO   75 mg at 12/30/14 1448  . carvedilol (COREG) tablet 6.25 mg  6.25 mg Oral QHS Tanna Savoy Stinson, DO   6.25 mg at 12/29/14 2230  . colesevelam [facility]) tablet 3,750 mg  3,750 mg Oral Daily Tanna Savoy Stinson, DO   3,750 mg at 12/30/14 1605  . Darbepoetin Alfa (ARANESP) injection 200 mcg  200 mcg Intravenous Q Wed-HD Alric Seton, PA-C   200 mcg at 12/28/14 1039  . doxercalciferol (HECTOROL) 4 MCG/2ML injection           . doxercalciferol (HECTOROL) injection 3 mcg  3 mcg Intravenous Q M,W,F-HD Alric Seton, PA-C   3 mcg at 12/30/14 1032  . famotidine (PEPCID) tablet 10 mg  10 mg Oral QHS Tanna Savoy Stinson, DO   10 mg at 12/29/14 2231  . feeding supplement (BOOST / RESOURCE BREEZE) liquid 1 Container  1 Container Oral TID BM Dale [place], RD   1 Container at 12/30/14 1400  .  isosorbide dinitrate (DILATRATE-SR) capsule 40 mg  40 mg Oral TID Tanna Savoy Stinson, DO   40 mg at 12/30/14 1605  . multivitamin (RENA-VIT) tablet 1 tablet  1 tablet Oral QHS Truett Mainland, DO   1 tablet at 12/29/14 2231  . oxyCODONE-acetaminophen (PERCOCET/ROXICET) 5-325 MG per tablet 1 tablet  1 tablet Oral Q4H PRN Caren Griffins, MD   1 tablet at 12/26/14 1257  . predniSONE (DELTASONE) tablet 5 mg  5 mg Oral Q breakfast Tanna Savoy Stinson, DO   5 mg at 12/30/14 1448  . senna-docusate (Senokot-S) tablet 3 tablet  3 tablet Oral BID Truett Mainland, DO   3 tablet at 12/30/14 1447  . Sirolimus TABS 1.5 mg  3 tablet Oral Daily Donato Heinz, MD   1.5 mg at 12/30/14 1605  . sodium chloride 0.9 % bolus 500 mL  500 mL Intravenous Once Jeryl Columbia, NP      . sorbitol, milk of mag, mineral oil, glycerin (SMOG) enema  500 mL Rectal Q4H PRN Tanna Savoy Stinson, DO      . vancomycin (VANCOCIN) IVPB 750 mg/150 ml premix  750 mg Intravenous Q M,W,F-HD Cecilio Asper Batchelder, RPH   750 mg at 12/28/14 1218  . warfarin (COUMADIN) tablet 4 mg  4 mg Oral ONCE-1800 Meagan Ival Bible, RPH      . Warfarin - Pharmacist Dosing Inpatient   Does not apply [place], DO   Stopped at 12/24/14 1800    Allergies as of 12/22/2014 - Review Complete 12/22/2014  Allergen Reaction Noted  . Lisinopril Swelling 09/27/2010  . Niacin and related Other (See Comments) 04/11/2014  . Norvasc [amlodipine besylate] Rash 09/27/2010  . Penicillins Rash 09/27/2010    Family History  Problem Relation Age of Onset  . Heart disease Mother   . Hyperlipidemia Mother   . Hypertension Mother   . Diabetes Mother   . Hyperlipidemia Father   . Hypertension Father   . Heart disease Father   . Diabetes Brother   .  Heart disease Brother     Heart Disease before age 51  . Hyperlipidemia Brother   . Hypertension Brother   . Heart attack Brother     History   Social History  . Marital Status: Married    Spouse Name: N/A  .  Number of Children: N/A  . Years of Education: N/A   Occupational History  . Not on file.   Social History Main Topics  . Smoking status: Former Smoker -- 1.00 packs/day for 20 years    Types: Cigarettes    Quit date: 05/27/1988  . Smokeless tobacco: Never Used  . Alcohol Use: Yes     Comment: "Holidays"  . Drug Use: No  . Sexual Activity: Not Currently   Other Topics Concern  . Not on file   Social History Narrative    Review of Systems: Denies abdominal pain  Physical Exam: Vital signs in last 24 hours: Temp:  [97.7 F (36.5 C)-98.6 F (37 C)] 97.7 F (36.5 C) (08/05 1350) Pulse Rate:  [83-98] 94 (08/05 1350) Resp:  [12-19] 15 (08/05 1350) BP: (103-138)/(47-66) 117/52 mmHg (08/05 1350) SpO2:  [100 %] 100 % (08/05 1350) Weight:  [76 kg (167 lb 8.8 oz)-79.7 kg (175 lb 11.3 oz)] 79.7 kg (175 lb 11.3 oz) (08/05 0826) Last BM Date: 12/30/14 General:   Alert,  Well-developed, well-nourished, pleasant and cooperative in NAD Head:  Normocephalic and atraumatic. Eyes:  Sclera clear, no icterus.  Mouth:   No ulcerations or lesions.  Oropharynx pink & moist. Neck:   No masses or thyromegaly. Lungs:  Clear throughout to auscultation.   No wheezes, crackles, or rhonchi. No evident respiratory distress. Heart:   Regular rate and rhythm; no murmurs, clicks, rubs,  or gallops. Abdomen:  Soft, nontender, nontympanitic, and nondistended. No masses, hepatosplenomegaly or ventral hernias noted. Normal bowel sounds, without bruits, guarding, or rebound.     Msk:   Symmetrical without gross deformities. Pulses:  Normal right radial pulse is noted. Extremities:   Without clubbing, cyanosis, or edema. Neurologic:  Alert and coherent;  grossly normal neurologically. Skin:  Intact without significant lesions or rashes. Cervical Nodes:  No significant cervical adenopathy. Psych:   Alert and cooperative. Normal mood and affect.  Intake/Output from previous day: 08/04 0701 - 08/05  0700 In: 960 [P.O.:960] Out: 1 [Stool:1] Intake/Output this shift: Total I/O In: 240 [P.O.:240] Out: -   Lab Results:  Recent Labs  12/28/14 0545 12/29/14 1450 12/30/14 0515  WBC 2.5* 5.0 2.8*  HGB 7.8* 9.9* 8.6*  HCT 24.2* 30.3* 26.5*  PLT 55* 50* 49*   BMET  Recent Labs  12/28/14 0545 12/30/14 0515  NA 139 136  K 3.1* 2.7*  CL 103 97*  CO2 30 28  GLUCOSE 112* 111*  BUN 23* 20  CREATININE 4.96* 5.03*  CALCIUM 8.3* 8.3*   LFT  Recent Labs  12/30/14 0515  ALBUMIN 2.2*   PT/INR  Recent Labs  12/29/14 0435 12/30/14 0515  LABPROT 19.5* 21.1*  INR 1.65* 1.83*    Studies/Results: No results found.  Impression: 1. Small volume hematochezia most compatible with rectal outlet bleeding from hemorrhoids. 2. Constipation  Plan: Unprepped sigmoidoscopy today to evaluate distribution of blood within the colon, to better confirm whether or not it is of rectal outlet origin, as expected.  Consider use of MiraLAX on a daily basis to maintain defecation.   LOS: 7 days   Curtina Grills V  12/30/2014, 4:33 PM   Pager [phone] If no answer  or after 5 PM call [phone]

## 2014-12-30 NOTE — Progress Notes (Signed)
Sigmoidoscopy, surprisingly, did not show significant hemorrhoids, but instead, significant distal rectal inflammation with multiple dime-sized shallow ulcerations.  Case discussed with Dr. Cruzita Lederer; please see dictated procedure report for further recommendations.  I have started the patient on MiraLAX to help maintain regular defecation, and would recommend that he remain on this following discharge. The patient could probably go home at any time, but I think an additional night of observation to monitor the trend on his platelet count and hemoglobin would be ideal.  Please feel free to call me at any time with any further questions pertaining to his case.  Cleotis Nipper, M.D. Pager [phone] If no answer or after 5 PM call [phone]

## 2014-12-30 NOTE — Progress Notes (Signed)
[facility] Progress Note  Assessment/Plan: 1. Sepsis - source unknown VVS,- wound culture +MRSA on Vanc, blood cultures no growth CXR neg; Afebrile/ HD cath removed; on day 7/14 Vanc - will complete course at outpt HD unit This is day 7. Needs full 2 week course. 2. ESRD - MWF - no heparin HD - HD today  K 2.7 today - to use 4 K bath, low K makes me think he didn't get an added K bath on Wed; also ordered 40 KCl po x1 3. Anemia - Hgb 8.6  yesterday--heme neg; Aranesp 200 - last given 8/3  -has now received 3 u RBC 1 on 7/30 1 on 8/2 and 1 on 8/3 4. Secondary hyperparathyroidism - continue hectorol. phoslo d/c'd with P up to 2.7- expect P to improve with intake at home; advised to hold binders at d/c until he is eating better then resume previous dose 5. BP/volume - BP better- had been hypotensive, small right effusion on CXR - standing weight not done pre HD- advised RN to get standing weight post HD due to significant variance from EDW 6. Nutrition - alb 2.2 Resource bid/vits- given low P and low K, and low Alb, will liberalize to regular diet with fluid restriction 7. Constipation - per primary 8. Hx heart tx, failed renal tx - continue current immunosuprresive tx  9. Hx DVT on coumadin INR 1.83 10. Thrombocytopenia - acute drop likely related to infx  11. Leukopenia- sirolimus level 2.6 12. Severe PAD - chronic right superficial femoral artery occlusion with reconstitution of the below-knee popliteal artery by angiogram 11/25/14.  No  RLE pain at this time, even with AVG cannulated and running on HD 13. DNR 14. Disp - ok for d/c today per renal  Myriam Jacobson, PA-C [facility] Beeper [phone] 12/30/2014,8:27 AM  LOS: 7 days   Subjective:   Feels better but not eating well yet. [place] working well  Objective Filed Vitals:   12/29/14 0812 12/29/14 1709 12/29/14 2007 12/30/14 0411  BP: 108/53 125/47 119/61 132/48  Pulse: 88 93 93 83  Temp: 98.5 F  (36.9 C) 98.4 F (36.9 C) 98.3 F (36.8 C) 97.8 F (36.6 C)  TempSrc: Oral Oral Oral Oral  Resp: 18 19 18 19   Height:      Weight:   76 kg (167 lb 8.8 oz)   SpO2: 99% 100% 100% 100%   Physical Exam General: good spirits, NAD Heart: RRR Lungs: no rales Abdomen: soft Extremities: no LE edema; still bilateral UE edema R>L, though improved from last weekend Dialysis Access: left IJ removed; exit ok - right thigh graft being cannulated  Dialysis Orders: Center: [facility] on MWF . EDW 68 kg HD Bath 2.0 K 2.0 Ca Time 4 hours No Heparin. Access LIJ per cath BFR 400 DFR Autoflow 1.5 Linear sodium  Aranesp 200 mcg IV (last dose 12/21/2014) Hgb 8.2 7/22 and 7.2 7/27 Hectoral 3 mcg IV MWF (Last dose 12/21/2014)  Additional Objective Labs: Basic Metabolic Panel:  Recent Labs Lab 12/27/14 0635 12/28/14 0545 12/30/14 0515  NA 141 139 136  K 3.1* 3.1* 2.7*  CL 100* 103 97*  CO2 32 30 28  GLUCOSE 87 112* 111*  BUN 13 23* 20  CREATININE 3.32* 4.96* 5.03*  CALCIUM 7.9* 8.3* 8.3*  PHOS 1.7* 2.3* 2.7   Liver Function Tests:  Recent Labs Lab 12/27/14 0635 12/28/14 0545 12/30/14 0515  ALBUMIN 2.1* 2.1* 2.2*   Lab Results  Component Value Date  INR 1.83* 12/30/2014   INR 1.65* 12/29/2014   INR 1.59* 12/28/2014    CBC:  Recent Labs Lab 12/26/14 2040 12/27/14 0635  12/28/14 0545 12/29/14 1450 12/30/14 0515  WBC 2.6* 2.3*  --  2.5* 5.0 2.8*  NEUTROABS  --   --   --  0.6*  --   --   HGB 7.8* 7.1*  < > 7.8* 9.9* 8.6*  HCT 23.1* 22.1*  < > 24.2* 30.3* 26.5*  MCV 88.2 92.1  --  90.6 88.9 88.9  PLT 53* 63*  --  55* 50* 49*  < > = values in this interval not displayed.   Medications:   . sodium chloride   Intravenous Once  . sodium chloride   Intravenous Once  . atorvastatin  40 mg Oral Daily  . azaTHIOprine  75 mg Oral q morning - 10a  . carvedilol  6.25 mg Oral QHS  . colesevelam  3,750 mg Oral Daily  . darbepoetin (ARANESP) injection - DIALYSIS  200 mcg  Intravenous Q Wed-HD  . doxercalciferol  3 mcg Intravenous Q M,W,F-HD  . famotidine  10 mg Oral QHS  . feeding supplement  1 Container Oral TID BM  . isosorbide dinitrate  40 mg Oral TID  . multivitamin  1 tablet Oral QHS  . predniSONE  5 mg Oral Q breakfast  . senna-docusate  3 tablet Oral BID  . Sirolimus  3 tablet Oral Daily  . sodium chloride  500 mL Intravenous Once  . vancomycin  750 mg Intravenous Q M,W,F-HD  . Warfarin - Pharmacist Dosing Inpatient   Does not apply q1800   I have seen and examined this patient and agree with plan as outlined in the above note of MBergman PAC. Pt is getting HD this AM. To complete another week of IV vanco at outpt HD unit for MRSA in right groin wound. Cannulating R AVG and it is running well at 400 (though required 2 cannulations of venous side today). Is not having any RLE pain. No binders at discharge, Max aranesp, primary care manages coumadin.  Should be OK for discharge today.  [name], MD [facility] [phone] Pager 12/30/2014, 9:31 AM

## 2014-12-30 NOTE — Progress Notes (Addendum)
ANTICOAGULATION CONSULT NOTE - Follow Up Consult  Pharmacy Consult for Warfarin Indication: Hx recurrent DVT  Allergies  Allergen Reactions  . Lisinopril Swelling    Lips and tongue swell  . Niacin And Related Other (See Comments)    unknown  . Norvasc [Amlodipine Besylate] Rash    Flushing  . Penicillins Rash    Patient Measurements: Height: 6' (182.9 cm) Weight: 175 lb 11.3 oz (79.7 kg) IBW/kg (Calculated) : 77.6  Vital Signs: Temp: 97.7 F (36.5 C) (08/05 1350) Temp Source: Oral (08/05 1350) BP: 117/52 mmHg (08/05 1350) Pulse Rate: 94 (08/05 1350)  Labs:  Recent Labs  12/28/14 0545 12/29/14 0435 12/29/14 1450 12/30/14 0515  HGB 7.8*  --  9.9* 8.6*  HCT 24.2*  --  30.3* 26.5*  PLT 55*  --  50* 49*  LABPROT 19.0* 19.5*  --  21.1*  INR 1.59* 1.65*  --  1.83*  CREATININE 4.96*  --   --  5.03*    Estimated Creatinine Clearance: 15.2 mL/min (by C-G formula based on Cr of 5.03).   Assessment: 79 YOM who continues on warfarin resumed from PTA for hx recurrent DVT. INR on admit was therapeutic at 2.39 on PTA dose of 5 mg daily. Due to an interaction with Levaquin (given on 7/29) - the INR elevated quickly requiring reversal with Vit K on 8/1. INR back down to 1.56 on (8/2). Warfarin 4mg  given 8/4 and INR today 1.83  The patient is noted to have a small hematoma which is stable. Hgb 8.6, plts 49. The patient was re-educated on warfarin this admission  Goal of Therapy:  INR 2-3 Monitor platelets by anticoagulation protocol: Yes   Plan:  1. Warfarin 4 mg x 1 dose at 1800 today if patient is not discharged 2. Will continue to monitor for any signs/symptoms of bleeding and will follow up with PT/INR/CBC in the a.m.  3. Recommend discharging patient back on home warfarin dose of 5mg  daily.   Bearl Talarico C. Lennox Grumbles, PharmD Pharmacy Resident  Pager: [phone] 12/30/2014 1:58 PM

## 2014-12-31 DIAGNOSIS — IMO0002 Reserved for concepts with insufficient information to code with codable children: Secondary | ICD-10-CM | POA: Insufficient documentation

## 2014-12-31 DIAGNOSIS — I10 Essential (primary) hypertension: Secondary | ICD-10-CM

## 2014-12-31 DIAGNOSIS — K922 Gastrointestinal hemorrhage, unspecified: Secondary | ICD-10-CM

## 2014-12-31 DIAGNOSIS — K625 Hemorrhage of anus and rectum: Secondary | ICD-10-CM | POA: Insufficient documentation

## 2014-12-31 DIAGNOSIS — D72819 Decreased white blood cell count, unspecified: Secondary | ICD-10-CM | POA: Insufficient documentation

## 2014-12-31 DIAGNOSIS — E785 Hyperlipidemia, unspecified: Secondary | ICD-10-CM

## 2014-12-31 DIAGNOSIS — E1165 Type 2 diabetes mellitus with hyperglycemia: Secondary | ICD-10-CM

## 2014-12-31 DIAGNOSIS — D696 Thrombocytopenia, unspecified: Secondary | ICD-10-CM

## 2014-12-31 DIAGNOSIS — I82409 Acute embolism and thrombosis of unspecified deep veins of unspecified lower extremity: Secondary | ICD-10-CM

## 2014-12-31 DIAGNOSIS — K5900 Constipation, unspecified: Secondary | ICD-10-CM | POA: Insufficient documentation

## 2014-12-31 DIAGNOSIS — R109 Unspecified abdominal pain: Secondary | ICD-10-CM | POA: Insufficient documentation

## 2014-12-31 DIAGNOSIS — N186 End stage renal disease: Secondary | ICD-10-CM | POA: Insufficient documentation

## 2014-12-31 DIAGNOSIS — N189 Chronic kidney disease, unspecified: Secondary | ICD-10-CM

## 2014-12-31 DIAGNOSIS — D631 Anemia in chronic kidney disease: Secondary | ICD-10-CM | POA: Insufficient documentation

## 2014-12-31 DIAGNOSIS — Z992 Dependence on renal dialysis: Secondary | ICD-10-CM

## 2014-12-31 DIAGNOSIS — A4102 Sepsis due to Methicillin resistant Staphylococcus aureus: Secondary | ICD-10-CM | POA: Insufficient documentation

## 2014-12-31 DIAGNOSIS — Z941 Heart transplant status: Secondary | ICD-10-CM

## 2014-12-31 LAB — RENAL FUNCTION PANEL
Albumin: 2.4 g/dL — ABNORMAL LOW (ref 3.5–5.0)
Anion gap: 6 (ref 5–15)
BUN: 14 mg/dL (ref 6–20)
CO2: 29 mmol/L (ref 22–32)
Calcium: 8.4 mg/dL — ABNORMAL LOW (ref 8.9–10.3)
Chloride: 102 mmol/L (ref 101–111)
Creatinine, Ser: 3.7 mg/dL — ABNORMAL HIGH (ref 0.61–1.24)
GFR, EST AFRICAN AMERICAN: 18 mL/min — AB (ref >60)
GFR, EST NON AFRICAN AMERICAN: 15 mL/min — AB (ref >60)
GLUCOSE: 122 mg/dL — AB (ref 65–99)
Phosphorus: 2.3 mg/dL — ABNORMAL LOW (ref 2.5–4.6)
Potassium: 4.4 mmol/L (ref 3.5–5.1)
Sodium: 137 mmol/L (ref 135–145)

## 2014-12-31 LAB — CATH TIP CULTURE: Culture: NO GROWTH

## 2014-12-31 LAB — CBC
HCT: 27.6 % — ABNORMAL LOW (ref 39.0–52.0)
HEMOGLOBIN: 9 g/dL — AB (ref 13.0–17.0)
MCH: 29.9 pg (ref 26.0–34.0)
MCHC: 32.6 g/dL (ref 30.0–36.0)
MCV: 91.7 fL (ref 78.0–100.0)
Platelets: 54 10*3/uL — ABNORMAL LOW (ref 150–400)
RBC: 3.01 MIL/uL — AB (ref 4.22–5.81)
RDW: 17.9 % — ABNORMAL HIGH (ref 11.5–15.5)
WBC: 3.8 10*3/uL — ABNORMAL LOW (ref 4.0–10.5)

## 2014-12-31 LAB — PROTIME-INR
INR: 1.96 — AB (ref 0.00–1.49)
PROTHROMBIN TIME: 22.2 s — AB (ref 11.6–15.2)

## 2014-12-31 MED ORDER — POLYETHYLENE GLYCOL 3350 17 G PO PACK: 17.0000 g | PACK | Freq: Every day | ORAL | Status: AC

## 2014-12-31 MED ORDER — DOXERCALCIFEROL 4 MCG/2ML IV SOLN: 3.0000 ug | INTRAVENOUS | Status: AC

## 2014-12-31 MED ORDER — PANTOPRAZOLE SODIUM 40 MG PO TBEC: 40.0000 mg | DELAYED_RELEASE_TABLET | Freq: Every day | ORAL | Status: DC

## 2014-12-31 MED ORDER — DARBEPOETIN ALFA 200 MCG/0.4ML IJ SOSY: 200.0000 ug | PREFILLED_SYRINGE | INTRAMUSCULAR | Status: AC

## 2014-12-31 MED ORDER — VANCOMYCIN HCL IN DEXTROSE 750-5 MG/150ML-% IV SOLN: 750.0000 mg | INTRAVENOUS | Status: AC

## 2014-12-31 NOTE — Progress Notes (Signed)
Plan for discharge noted, and I agree. Hemoglobin and platelets stable. No perianal pain.  I have contacted the medical assistant for Dr. Wilford Corner, who will contact the patient early during the coming week to arrange follow-up sigmoidoscopy, to assess healing.  Cleotis Nipper, M.D. Pager [phone] If no answer or after 5 PM call [phone]

## 2014-12-31 NOTE — Progress Notes (Signed)
Subjective:   Feeling great, wants to go home today. Says has been walking and feels he is ok for DC  Objective Filed Vitals:   12/30/14 1655 12/30/14 1733 12/30/14 2127 12/31/14 0521  BP: 154/60 110/56 120/50 114/47  Pulse:  95 97 85  Temp:  99.3 F (37.4 C) 99.8 F (37.7 C) 98.2 F (36.8 C)  TempSrc:  Oral Oral Oral  Resp: 21 20 18 12   Height:      Weight:   77.3 kg (170 lb 6.7 oz)   SpO2: 100% 98% 98% 99%   Physical Exam General: alert and oriented, no acute distress.  Heart: RRR Lungs: CTA Abdomen: soft, nontender +BS  Extremities: no LE edema. bilat upper ext edema.  Dialysis Access:  R thigh AVG +b/t    IJ cath removed  Dialysis Orders: Center: [facility] on MWF . EDW 68 kg HD Bath 2.0 K 2.0 Ca Time 4 hours No Heparin. Access LIJ per cath BFR 400 DFR Autoflow 1.5 Linear sodium  Aranesp 200 mcg IV (last dose 12/21/2014) Hgb 8.2 7/22 and 7.2 7/27 Hectoral 3 mcg IV MWF (Last dose 12/21/2014)  Assessment/Plan: 1. Sepsis - source unknown VVS,- wound culture +MRSA on Vanc, blood cultures no growth CXR neg; Afebrile/ HD cath removed; on day 8/14 Vanc - will complete course at outpt HD unit - stop date 8/12 2. ESRD - MWF - no heparin HD - HD today K 4.4 used 4 K bath yesterday and was replaced. 3. Anemia - Hgb9platlets stable--heme neg; Aranesp 200 - last given 8/3 -has now received 3 u RBC 1 on 7/30 1 on 8/2 and 1 on 8/3 4. Secondary hyperparathyroidism - continue hectorol. phoslo d/c'd with P up to 23- expect P to improve with intake at home; advised to hold binders at d/c until he is eating better then resume previous dose 5. BP/volume - BP better- had been hypotensive, small right effusion on CXR- 9kgs over edw per wts here- unsure it standing vs bed wts  6. Nutrition - alb 2.4Resource bid/vits- given low P and low K, and low Alb, will liberalize to regular diet with fluid restriction 7. Constipation - started miralax. Rectal inflammation and ulcerations on  sigmoidoscopy 8. Hx heart tx, failed renal tx - continue current immunosuprresive tx  9. Hx DVT on coumadin INR 1.83 10. Thrombocytopenia - acute drop likely related to infx  11. Leukopenia- sirolimus level 2.6 12. Severe PAD - chronic right superficial femoral artery occlusion with reconstitution of the below-knee popliteal artery by angiogram 11/25/14. No RLE pain 13. Rectal inflammation with discrete rectal ulcerations on flex sigmoidoscopy 8/5. Dr. Cristina Gong is OK with discharge and is arranging followup with Dr. Michail Sermon for a repeat flex sig 14. DNR 15. Disp - ok for d/c today   Shelle Iron, NP [place] [phone] 12/31/2014,9:58 AM  LOS: 8 days    Additional Objective Labs: Basic Metabolic Panel:  Recent Labs Lab 12/28/14 0545 12/30/14 0515 12/31/14 0504  NA 139 136 137  K 3.1* 2.7* 4.4  CL 103 97* 102  CO2 30 28 29   GLUCOSE 382* 111* 122*  BUN 23* 20 14  CREATININE 4.96* 5.03* 3.70*  CALCIUM 8.3* 8.3* 8.4*  PHOS 2.3* 2.7 2.3*   Liver Function Tests:  Recent Labs Lab 12/28/14 0545 12/30/14 0515 12/31/14 0504  ALBUMIN 2.1* 2.2* 2.4*   CBC:  Recent Labs Lab 12/27/14 0635  12/28/14 0545 12/29/14 1450 12/30/14 0515 12/31/14 0504  WBC 2.3*  --  2.5* 5.0 2.8* 3.8*  NEUTROABS  --   --  0.6*  --   --   --   HGB 7.1*  < > 7.8* 9.9* 8.6* 9.0*  HCT 22.1*  < > 24.2* 30.3* 26.5* 27.6*  MCV 92.1  --  90.6 88.9 88.9 91.7  PLT 63*  --  55* 50* 49* 54*  < > = values in this interval not displayed. Blood Culture    Component Value Date/Time   SDES CATH TIP 12/28/2014 1535   SPECREQUEST LEFT IJ 12/28/2014 1535   CULT  12/28/2014 1535    NO GROWTH 1 DAY Performed at [facility] PENDING 12/28/2014 1535     Studies/Results: No results found. Medications:   . sodium chloride   Intravenous Once  . sodium chloride   Intravenous Once  . atorvastatin  40 mg Oral Daily  . azaTHIOprine  75 mg Oral q morning -  10a  . carvedilol  6.25 mg Oral QHS  . colesevelam  3,750 mg Oral Daily  . darbepoetin (ARANESP) injection - DIALYSIS  200 mcg Intravenous Q Wed-HD  . doxercalciferol  3 mcg Intravenous Q M,W,F-HD  . famotidine  10 mg Oral QHS  . feeding supplement  1 Container Oral TID BM  . isosorbide dinitrate  40 mg Oral TID  . multivitamin  1 tablet Oral QHS  . polyethylene glycol  17 g Oral Daily  . predniSONE  5 mg Oral Q breakfast  . senna-docusate  3 tablet Oral BID  . Sirolimus  3 tablet Oral Daily  . sodium chloride  500 mL Intravenous Once  . vancomycin  750 mg Intravenous Q M,W,F-HD  . Warfarin - Pharmacist Dosing Inpatient   Does not apply q1800   I have seen and examined this patient and agree with plan and assessment in the above note of BWhelan NP with the highlighted additions. Should be OK for discharge today with resumption of usual outpt HD on Monday at [facility]. Stop date for vanco 8/12 for MRSA in groin wound.  Raelea Gosse B,MD 12/31/2014 12:21 PM

## 2014-12-31 NOTE — Discharge Summary (Signed)
Physician Discharge Summary  Brandon Sharp ZSW:[id number] DOB: 09/04/1945 DOA: 12/22/2014  PCP: [name], MD  Admit date: 12/22/2014 Discharge date: 12/31/2014  Time spent: 40 minutes  Recommendations for Outpatient Follow-up:  Sepsis/possible MRSA skin infection right groin wound - sepsis physiology resolved; most likely secondary to MRSA in right groin wound  -Growing MRSA from his right groin wound - now on Vancomycin day 7, will get Vanc with HD, plan for total of 14 days  Constipation/Fecal Impaction/blood in stool/rectal bleeding - Per patient, has been constipated x 1 week - CT abdomen pelvis demonstrated increased stool burden without bowel obstruction  - This has resolved -Follow-up with primary GI physician Dr. Michail Sermon in one week  Anemia due to chronic kidney disease / worsening in the setting of sepsis and rectal bleed - Patient did require1unit PRBC for hemoglobin of 6.4 on 7/28 - Continue anemia but stable -Follow-up with PCP and GI physician.  Thrombocytopenia / leukopenia - has been chronically low, no bleeding - sirolimus level 2.6 -Anemia and thrombocytopenia most likely secondary to Imuran  S/P heart transplant - Transplant 07/10/2009 at [facility].  - On imuran and prednisone - Echo 07/2013: LVEF 55-60%, no wall motion abnormalities   DVT, on chronic anticoagulation - continue Coumadin  Hypertension - On carvedilol, 6.25 mg daily    ESRD - History of renal transplant and rejection  - On Hemodialysis M/W/F - nephrology following, HD today  Type 2 Diabetes Mellitus uncontrolled -8/6 hemoglobin A1c= 9 - Controlled with short acting insulin -PCP to evaluate cholesterol level -PCP to titrate medication to effect  HLD -PCP to obtain cholesterol panel -Continue Lipitor 40 mg daily  Weakness - PT evaluation recommending HHPT   Discharge Diagnoses:  Principal Problem:   Sepsis Active Problems:   DVT (deep venous thrombosis)   Heart  transplanted   CKD (chronic kidney disease), stage V   ESRD on hemodialysis   Constipation   Fecal impaction   Pressure ulcer   Discharge Condition: Stable  Diet recommendation: Diabetic/heart healthy  Filed Weights   12/30/14 1250 12/30/14 1350 12/30/14 2127  Weight: 76.8 kg (169 lb 5 oz) 76.8 kg (169 lb 5 oz) 77.3 kg (170 lb 6.7 oz)    History of present illness:  69 y.o.BM PMHx Diabetes type II controlled, ESRD on HD M/W/F, HTN, PVD, DVT on chronic anticoagulation, Diastolic CHF S/P heart transplant. He was recently hospitalized earlier this month with end-stage renal disease and symptomatic anemia. He was in skilled until the 20th of this month, where he was discharged to home.   Reports one week of constipation with no stooling. He has been having flatulence. He presents with 24 hours of worsening abdominal pain, particularly in the left lower quadrant with no radiation. Pain is described as severe cramping. Partially relieved with flatulence. No other provoking or palliating factors. Manual disimpaction was attempted in the emergency department, which the patient did not tolerate. Additionally, a smog enema was attempted, however the patient did not tolerate this. While in emergency department the patient has had 3 very small stools that are very hard in consistency During his hospitalization patient received flexible sigmoidoscopy which revealed rectal inflammation with ulcerations see results below. Patient will follow-up with his regular GI specialist Dr. Michail Sermon in~1 week for possible repeat sigmoidoscopy. 8/6 patient states negative abdominal pain, negative N/V, negative blood per rectum overnight.  Procedures: 7/28 transfused one unit PRBC 8/5 flexible sigmoidoscopy;-Rectal inflammation and discrete ulcerations, possibly stercoral ulcerations related to his recent constipation  and fecal impaction; Rectal mucosal ischemia?    Consultations: Dr.Robert Buccini  (GI)  Culture 7/28 MRSA by PCR negative 7/29 right groin wound positive MRSA 7/29 blood left hand 2 negative 8/3 left IJ cath tip NGTD  Antibiotics    Levofloxacin 12/23/14 >>12/25/14  Vancomycin 12/23/14 >>   Aztreonam 12/23/14 >>8/1   Discharge Exam: Filed Vitals:   12/30/14 1655 12/30/14 1733 12/30/14 2127 12/31/14 0521  BP: 154/60 110/56 120/50 114/47  Pulse:  95 97 85  Temp:  99.3 F (37.4 C) 99.8 F (37.7 C) 98.2 F (36.8 C)  TempSrc:  Oral Oral Oral  Resp: 21 20 18 12   Height:      Weight:   77.3 kg (170 lb 6.7 oz)   SpO2: 100% 98% 98% 99%    General: A/O 4, NAD, negative acute respiratory failure. Cardiovascular: Regular rhythm and rate, negative murmurs rubs or gallops, normal S1/S2 Respiratory: Clear to auscultation bilateral Abdomen; soft, nondistended, plus bowel sounds, negative pain to palpation.  Discharge Instructions     Medication List    ASK your doctor about these medications        atorvastatin 40 MG tablet  Commonly known as:  LIPITOR  Take 40 mg by mouth daily.     azaTHIOprine 50 MG tablet  Commonly known as:  IMURAN  Take 75 mg by mouth every morning.     b complex-vitamin c-folic acid 0.8 MG Tabs tablet  Take 1 tablet by mouth at bedtime.     B-D INS SYR HALF-UNIT .3CC/31G 31G X 5/16" 0.3 ML Misc  Generic drug:  Insulin Syringe-Needle U-100  USE AS DIRECTED UP TO 2-4 TIMES A DAY     calcium acetate 667 MG capsule  Commonly known as:  PHOSLO  Take 1 capsule (667 mg total) by mouth 2 (two) times daily with a meal.     carvedilol 6.25 MG tablet  Commonly known as:  COREG  Take 1 tablet (6.25 mg total) by mouth at bedtime.     colesevelam 625 MG tablet  Commonly known as:  WELCHOL  Take 3,750 mg by mouth daily. Takes 6 tabs daily     famotidine 10 MG tablet  Commonly known as:  PEPCID  Take 1 tablet (10 mg total) by mouth at bedtime.     HUMALOG KWIKPEN 100 UNIT/ML KiwkPen  Generic drug:  insulin lispro  Inject 4-10  Units into the skin 2 (two) times daily. Per sliding scale     isosorbide dinitrate 40 MG CR tablet  Commonly known as:  ISOCHRON  Take 40 mg by mouth every 8 (eight) hours.     oxyCODONE 5 MG immediate release tablet  Commonly known as:  Oxy IR/ROXICODONE  Take 1 tablet (5 mg total) by mouth every 6 (six) hours as needed for moderate pain.     pantoprazole 40 MG tablet  Commonly known as:  PROTONIX  Take 1 tablet (40 mg total) by mouth daily.     predniSONE 5 MG tablet  Commonly known as:  DELTASONE  Take 1 tablet (5 mg total) by mouth daily with breakfast.     senna-docusate 8.6-50 MG per tablet  Commonly known as:  Senokot-S  Take 3 tablets by mouth 2 (two) times daily.     Sirolimus 0.5 MG Tabs  Take 3 tablets by mouth daily.     warfarin 5 MG tablet  Commonly known as:  COUMADIN  Please consult: Inpatient pharmacy to continue dosing warfarin and monitor  Allergies  Allergen Reactions  . Lisinopril Swelling    Lips and tongue swell  . Niacin And Related Other (See Comments)    unknown  . Norvasc [Amlodipine Besylate] Rash    Flushing  . Penicillins Rash      The results of significant diagnostics from this hospitalization (including imaging, microbiology, ancillary and laboratory) are listed below for reference.    Significant Diagnostic Studies: Ct Abdomen Pelvis W Contrast  12/22/2014   CLINICAL DATA:  Abdominal pain and constipation. Post right groin hematoma evacuation 1 month prior.  EXAM: CT ABDOMEN AND PELVIS WITH CONTRAST  TECHNIQUE: Multidetector CT imaging of the abdomen and pelvis was performed using the standard protocol following bolus administration of intravenous contrast.  CONTRAST:  137mL OMNIPAQUE IOHEXOL 300 MG/ML  SOLN  COMPARISON:  CT 05/06/2014  FINDINGS: Lower chest: Cardiomegaly. Mild subpleural reticulation consistent chronic lung disease in the lingula and right middle lobe.  Hepatobiliary: Scattered small hypodensities, likely cysts  or hemangiomas, with the largest cyst measuring 2.1 cm in the right lobe. Gallbladder is physiologically distended. No biliary dilatation.  Pancreas: Mildly atrophic without ductal dilatation or focal abnormality.  Spleen: Normal.  Adrenals/Urinary Tract: Bilateral renal parenchymal atrophy. Renal parenchyma near completely replaced with hypodense lesions of varying sizes. Some of the cyst superior hyperdense. No hydronephrosis. Partially calcified cysts the right renal hilum, unchanged in size from prior. The adrenal glands are normal. And transplant kidney in the right iliac fossa is again seen and not significantly changed in appearance. No transplant hydronephrosis.  Stomach/Bowel: Stomach is physiologically distended. There are no dilated or thickened bowel loops. The appendix is normal. Small to moderate volume of stool in the proximal colon. Increased stool burden distally with stool ball distends the rectum. Distal colonic diverticulosis without diverticulitis.  Vascular/Lymphatic: Dense atherosclerosis of the abdominal aorta and its branches. No aneurysm. No retroperitoneal adenopathy. Right groin fluid collection insinuating about the right femoral vessels and presumed graft material. Fluid collection measures approximately 5.1 cm in greatest dimension. There is no peripheral enhancement or thick wall. Overlying subcutaneous defect, with skin appearing intact.  Reproductive: Prostate gland is trauma in size.  Urinary bladder:  Near completely decompressed.  Other:  No ascites.  No intra-abdominal fluid collection.  Musculoskeletal: There are no acute or suspicious osseous abnormalities. Scattered degenerative change in the lumbar spine. Advanced degenerative change of the left hip.  IMPRESSION: 1. Increased stool burden distally with stool ball distending the rectum, supports clinical diagnosis of constipation. No bowel obstruction. 2. Elongated fluid density structure in the right groin, likely sequela of  prior hematoma. No CT findings of infection. 3. Additional stable chronic findings include diverticulosis without diverticulitis, atrophy and cystic replacement of the native kidneys with right lower quadrant renal transplant, hepatic cysts, and advanced atherosclerosis.   Electronically Signed   By: Jeb Levering M.D.   On: 12/22/2014 06:41   Dg Chest Port 1 View  12/23/2014   CLINICAL DATA:  Sepsis  EXAM: PORTABLE CHEST - 1 VIEW  COMPARISON:  November 11, 2014  FINDINGS: There is a small pleural effusion on the right with a right base atelectasis. Elsewhere lungs are clear. Heart is borderline enlarged with pulmonary vascularity within normal limits. Central catheter tip is at the cavoatrial junction. No pneumothorax. There are stents in each axillary region.  IMPRESSION: Small right pleural effusion with right base atelectasis. Lungs elsewhere clear. Heart prominent but stable. No pneumothorax.   Electronically Signed   By: Lowella Grip III M.D.  On: 12/23/2014 08:40   Dg Tibia/fibula Right Port  12/24/2014   CLINICAL DATA:  69 year old male with a history of pain in the right tibia with no known injury.  EXAM: PORTABLE RIGHT TIBIA AND FIBULA - 2 VIEW  COMPARISON:  None.  FINDINGS: No acute fracture line identified. No significant soft tissue swelling. No radiopaque foreign body. Degenerative changes of the ankle and knee.  Extensive vascular calcifications of the femoral popliteal vessels and tibial vessels.  IMPRESSION: No acute fracture identified.  Extensive vascular calcifications compatible with femoral popliteal disease and infrapopliteal disease. If the patient has not yet been evaluated for claudication/critical limb ischemia as a source of resting pain/leg pain, noninvasive testing with ABI, segmental duplex, and segmental pulse volume recording may be considered as well as office based evaluation.  Signed,  Dulcy Fanny. Earleen Newport, DO  [facility]  [facility]  Radiology   Electronically Signed   By: Corrie Mckusick D.O.   On: 12/24/2014 10:53    Microbiology: Recent Results (from the past 240 hour(s))  MRSA PCR Screening     Status: None   Collection Time: 12/22/14 12:37 PM  Result Value Ref Range Status   MRSA by PCR NEGATIVE NEGATIVE Final    Comment:        The GeneXpert MRSA Assay (FDA approved for NASAL specimens only), is one component of a comprehensive MRSA colonization surveillance program. It is not intended to diagnose MRSA infection nor to guide or monitor treatment for MRSA infections.   Wound culture     Status: None   Collection Time: 12/23/14  8:24 AM  Result Value Ref Range Status   Specimen Description WOUND RIGHT GROIN  Final   Special Requests Normal  Final   Gram Stain   Final    RARE WBC PRESENT, PREDOMINANTLY PMN FEW SQUAMOUS EPITHELIAL CELLS PRESENT FEW GRAM POSITIVE COCCI IN PAIRS Performed at [facility]    Culture   Final    MODERATE METHICILLIN RESISTANT STAPHYLOCOCCUS AUREUS Note: RIFAMPIN AND GENTAMICIN SHOULD NOT BE USED AS SINGLE DRUGS FOR TREATMENT OF STAPH INFECTIONS. This organism DOES NOT demonstrate inducible Clindamycin resistance in vitro. CRITICAL RESULT CALLED TO, READ BACK BY AND VERIFIED WITH: REBECCA G 8/1  @216  BY REAMM Performed at [facility]    Report Status 12/26/2014 FINAL  Final   Organism ID, Bacteria METHICILLIN RESISTANT STAPHYLOCOCCUS AUREUS  Final      Susceptibility   Methicillin resistant staphylococcus aureus - MIC*    CLINDAMYCIN <=0.25 SENSITIVE Sensitive     ERYTHROMYCIN >=8 RESISTANT Resistant     GENTAMICIN <=0.5 SENSITIVE Sensitive     LEVOFLOXACIN <=0.12 SENSITIVE Sensitive     OXACILLIN >=4 RESISTANT Resistant     RIFAMPIN <=0.5 SENSITIVE Sensitive     TRIMETH/SULFA <=10 SENSITIVE Sensitive     VANCOMYCIN <=0.5 SENSITIVE Sensitive     TETRACYCLINE <=1 SENSITIVE Sensitive     * MODERATE METHICILLIN RESISTANT STAPHYLOCOCCUS AUREUS  Culture,  blood (x 2)     Status: None   Collection Time: 12/23/14 10:10 AM  Result Value Ref Range Status   Specimen Description BLOOD LEFT HAND  Final   Special Requests IN PEDIATRIC BOTTLE 1CC  Final   Culture NO GROWTH 5 DAYS  Final   Report Status 12/28/2014 FINAL  Final  Culture, blood (x 2)     Status: None   Collection Time: 12/23/14 10:50 AM  Result Value Ref Range Status   Specimen Description BLOOD LEFT  HAND  Final   Special Requests IN PEDIATRIC BOTTLE 3CC  Final   Culture NO GROWTH 5 DAYS  Final   Report Status 12/28/2014 FINAL  Final  Cath Tip Culture     Status: None (Preliminary result)   Collection Time: 12/28/14  3:35 PM  Result Value Ref Range Status   Specimen Description CATH TIP  Final   Special Requests LEFT IJ  Final   Culture   Final    NO GROWTH 1 DAY Performed at [facility]    Report Status PENDING  Incomplete     Labs: Basic Metabolic Panel:  Recent Labs Lab 12/26/14 2315 12/27/14 0635 12/28/14 0545 12/30/14 0515 12/31/14 0504  NA 139 141 139 136 137  K 4.9 3.1* 3.1* 2.7* 4.4  CL 101 100* 103 97* 102  CO2 28 32 30 28 29   GLUCOSE 105* 87 112* 111* 122*  BUN 12 13 23* 20 14  CREATININE 2.90* 3.32* 4.96* 5.03* 3.70*  CALCIUM 7.8* 7.9* 8.3* 8.3* 8.4*  PHOS 2.0* 1.7* 2.3* 2.7 2.3*   Liver Function Tests:  Recent Labs Lab 12/26/14 2315 12/27/14 0635 12/28/14 0545 12/30/14 0515 12/31/14 0504  ALBUMIN 2.2* 2.1* 2.1* 2.2* 2.4*   No results for input(s): LIPASE, AMYLASE in the last 168 hours. No results for input(s): AMMONIA in the last 168 hours. CBC:  Recent Labs Lab 12/27/14 0635 12/27/14 1110 12/28/14 0545 12/29/14 1450 12/30/14 0515 12/31/14 0504  WBC 2.3*  --  2.5* 5.0 2.8* 3.8*  NEUTROABS  --   --  0.6*  --   --   --   HGB 7.1* 7.0* 7.8* 9.9* 8.6* 9.0*  HCT 22.1* 22.0* 24.2* 30.3* 26.5* 27.6*  MCV 92.1  --  90.6 88.9 88.9 91.7  PLT 63*  --  55* 50* 49* 54*   Cardiac Enzymes: No results for input(s): CKTOTAL, CKMB,  CKMBINDEX, TROPONINI in the last 168 hours. BNP: BNP (last 3 results) No results for input(s): BNP in the last 8760 hours.  ProBNP (last 3 results)  Recent Labs  03/27/14 1747 04/12/14 0105  PROBNP 37853.0* 45045.0*    CBG: No results for input(s): GLUCAP in the last 168 hours.     Signed:  Dia Crawford, MD [facility] [phone] pager

## 2014-12-31 NOTE — Care Management Note (Signed)
Case Management Note  Patient Details  Name: Brandon Sharp MRN: [id number] Date of Birth: [birth year]  Subjective/Objective:                  fecal impaction #2 constipation  Action/Plan: D/c home with HHRN/ HHPT/ HHOT  Expected Discharge Date:    12/31/2014              Expected Discharge Plan:  [place]  In-House Referral:  NA  Discharge planning Services  CM Consult  Post Acute Care Choice:    Choice offered to:     DME Arranged:    DME Agency:     HH Arranged:   HHRN, [place], [place] Agency:   [facility]  Status of Service:  Completed, sign off Medicare Important Message Given:  Yes-second notification given Date Medicare IM Given:    Medicare IM give by:    Date Additional Medicare IM Given:    Additional Medicare Important Message give by:     If discussed at [place] of Stay Meetings, dates discussed:    Additional Comments: Notified [facility] of patients discharge for today.  Patient had been client of there's prior to admission, they would be able to resume care after this discharge, information on [facility] orders are for d/c wound vac, per [facility] rep  they were not doing wound vac dressing changes on client prior to admission.  Contacted MD to add drsng change orders for [facility] all other [place] orders in place prior to patient's discharge. No additonal CM needs at this time.   Corine Shelter, RN 12/31/2014, 2:49 PM

## 2015-01-02 ENCOUNTER — Encounter (HOSPITAL_COMMUNITY): Payer: Self-pay | Admitting: Gastroenterology

## 2015-01-11 ENCOUNTER — Encounter: Payer: Self-pay | Admitting: Vascular Surgery

## 2015-01-11 LAB — AFB CULTURE, BLOOD: CULTURE: UNDETERMINED

## 2015-01-11 LAB — VIRAL CULTURE VIRC: Culture: UNDETERMINED

## 2015-01-12 ENCOUNTER — Ambulatory Visit (INDEPENDENT_AMBULATORY_CARE_PROVIDER_SITE_OTHER): Payer: Medicare Other | Admitting: Vascular Surgery

## 2015-01-12 ENCOUNTER — Encounter: Payer: Self-pay | Admitting: Vascular Surgery

## 2015-01-12 VITALS — BP 131/70 | HR 72 | Temp 98.0°F | Resp 16 | Ht 72.0 in | Wt 169.0 lb

## 2015-01-12 DIAGNOSIS — Z992 Dependence on renal dialysis: Secondary | ICD-10-CM

## 2015-01-12 DIAGNOSIS — N186 End stage renal disease: Secondary | ICD-10-CM

## 2015-01-12 NOTE — Progress Notes (Signed)
    Postoperative Access Visit   History of Present Illness  Brandon Sharp is a 69 y.o. year old male who presents for postoperative follow-up for: R thigh AVG (Date: 10/20/14).  This AVG placement was complicated by R thigh hematoma due to use Lovenox use in the perioperative period.  He had this hematoma evacuated on 11/21/14.  He also admitted to the hospital with sepsis felt due to his immunosuppressed state due to heart transplant and possible infected RIJV TDC.  While admitted, he also underwent an aortogram with right leg runoff for right leg ischemia sx on 11/25/14 with Dr. Oneida Alar.  This demonstrated SFA occlusion with reconstitution of a below-the-knee popliteal artery and peroneal artery runoff.  The patient elected to observe his right foot for further sx as his pain resolved.   For VQI Use Only  PRE-ADM LIVING: Home  AMB STATUS: Ambulatory  Physical Examination Filed Vitals:   01/12/15 1511  BP: 131/70  Pulse: 72  Temp: 98 F (36.7 C)  Resp: 16    RLE: groin Incision is healed, skin feels warm, fully distal motor and sensation, no palpable pedal pulses, strong thrill and bruit in thigh AVG  Medical Decision Making  Brandon Sharp is a 69 y.o. year old male who presents s/p  R thigh AVG with hematoma evacuation, BLE PAD   The patient's access is already being used.  Will check both legs with BLE ABI q3 months to watch his PAD for any progression given his ESRD-HD and immunosuppressed status.  Thank you for allowing [place] to participate in this patient's care.  Adele Barthel, MD [facility] [place] Office: [phone] Pager: [phone]  01/12/2015, 3:30 PM

## 2015-01-17 ENCOUNTER — Ambulatory Visit (HOSPITAL_COMMUNITY)
Admission: RE | Admit: 2015-01-17 | Discharge: 2015-01-17 | Disposition: A | Discharge status: Home / Self Care | Payer: Medicare Other | Source: Ambulatory Visit | Attending: Nephrology | Admitting: Nephrology

## 2015-01-17 DIAGNOSIS — D649 Anemia, unspecified: Secondary | ICD-10-CM | POA: Diagnosis not present

## 2015-01-17 LAB — PREPARE RBC (CROSSMATCH)

## 2015-01-17 LAB — ABO/RH: ABO/RH(D): O POS

## 2015-01-17 MED ORDER — SODIUM CHLORIDE 0.9 % IV SOLN: Freq: Once | INTRAVENOUS | Status: DC

## 2015-01-17 NOTE — Progress Notes (Signed)
MD: Dr. Lorrene Reid  Diagnosis: Anemia and chronic illness  10-D63.8  Procedure : Peripheral IV inserted, blood drawn for type and crossmatch. 2 units of prbc's infused.  Condition during procedure: Pt tolerated well.  Post procedure: Pt alert and oriented, ambulatory to wheelchair.

## 2015-01-18 LAB — TYPE AND SCREEN
ABO/RH(D): O POS
ANTIBODY SCREEN: NEGATIVE
Unit division: 0
Unit division: 0

## 2015-01-24 ENCOUNTER — Encounter (HOSPITAL_COMMUNITY): Payer: Self-pay | Admitting: *Deleted

## 2015-01-27 ENCOUNTER — Emergency Department (HOSPITAL_COMMUNITY)
Admission: EM | Admit: 2015-01-27 | Discharge: 2015-01-27 | Disposition: A | Discharge status: Home / Self Care | Payer: Medicare Other | Attending: Emergency Medicine | Admitting: Emergency Medicine

## 2015-01-27 ENCOUNTER — Encounter (HOSPITAL_COMMUNITY): Payer: Self-pay | Admitting: Emergency Medicine

## 2015-01-27 DIAGNOSIS — E119 Type 2 diabetes mellitus without complications: Secondary | ICD-10-CM | POA: Insufficient documentation

## 2015-01-27 DIAGNOSIS — Z880 Allergy status to penicillin: Secondary | ICD-10-CM | POA: Insufficient documentation

## 2015-01-27 DIAGNOSIS — I509 Heart failure, unspecified: Secondary | ICD-10-CM | POA: Insufficient documentation

## 2015-01-27 DIAGNOSIS — N186 End stage renal disease: Secondary | ICD-10-CM | POA: Insufficient documentation

## 2015-01-27 DIAGNOSIS — Z87891 Personal history of nicotine dependence: Secondary | ICD-10-CM | POA: Insufficient documentation

## 2015-01-27 DIAGNOSIS — Z8701 Personal history of pneumonia (recurrent): Secondary | ICD-10-CM | POA: Insufficient documentation

## 2015-01-27 DIAGNOSIS — D631 Anemia in chronic kidney disease: Secondary | ICD-10-CM | POA: Insufficient documentation

## 2015-01-27 DIAGNOSIS — Z992 Dependence on renal dialysis: Secondary | ICD-10-CM | POA: Insufficient documentation

## 2015-01-27 DIAGNOSIS — I252 Old myocardial infarction: Secondary | ICD-10-CM | POA: Insufficient documentation

## 2015-01-27 DIAGNOSIS — Z79899 Other long term (current) drug therapy: Secondary | ICD-10-CM | POA: Insufficient documentation

## 2015-01-27 DIAGNOSIS — I25119 Atherosclerotic heart disease of native coronary artery with unspecified angina pectoris: Secondary | ICD-10-CM | POA: Insufficient documentation

## 2015-01-27 DIAGNOSIS — Z86718 Personal history of other venous thrombosis and embolism: Secondary | ICD-10-CM | POA: Insufficient documentation

## 2015-01-27 DIAGNOSIS — I120 Hypertensive chronic kidney disease with stage 5 chronic kidney disease or end stage renal disease: Secondary | ICD-10-CM | POA: Insufficient documentation

## 2015-01-27 DIAGNOSIS — D591 Other autoimmune hemolytic anemias: Secondary | ICD-10-CM | POA: Insufficient documentation

## 2015-01-27 DIAGNOSIS — D649 Anemia, unspecified: Secondary | ICD-10-CM

## 2015-01-27 LAB — CBC
HEMATOCRIT: 19 % — AB (ref 39.0–52.0)
HEMOGLOBIN: 6.1 g/dL — AB (ref 13.0–17.0)
MCH: 28.5 pg (ref 26.0–34.0)
MCHC: 32.1 g/dL (ref 30.0–36.0)
MCV: 88.8 fL (ref 78.0–100.0)
Platelets: 44 10*3/uL — ABNORMAL LOW (ref 150–400)
RBC: 2.14 MIL/uL — ABNORMAL LOW (ref 4.22–5.81)
RDW: 16 % — ABNORMAL HIGH (ref 11.5–15.5)
WBC: 2.9 10*3/uL — ABNORMAL LOW (ref 4.0–10.5)

## 2015-01-27 LAB — BASIC METABOLIC PANEL
ANION GAP: 10 (ref 5–15)
BUN: 27 mg/dL — ABNORMAL HIGH (ref 6–20)
CO2: 29 mmol/L (ref 22–32)
Calcium: 8.5 mg/dL — ABNORMAL LOW (ref 8.9–10.3)
Chloride: 96 mmol/L — ABNORMAL LOW (ref 101–111)
Creatinine, Ser: 6.18 mg/dL — ABNORMAL HIGH (ref 0.61–1.24)
GFR calc Af Amer: 10 mL/min — ABNORMAL LOW (ref >60)
GFR, EST NON AFRICAN AMERICAN: 8 mL/min — AB (ref >60)
Glucose, Bld: 81 mg/dL (ref 65–99)
Potassium: 3.2 mmol/L — ABNORMAL LOW (ref 3.5–5.1)
Sodium: 135 mmol/L (ref 135–145)

## 2015-01-27 LAB — POC OCCULT BLOOD, ED: Fecal Occult Bld: NEGATIVE

## 2015-01-27 LAB — PROTIME-INR
INR: 2.24 — ABNORMAL HIGH (ref 0.00–1.49)
Prothrombin Time: 24.6 s — ABNORMAL HIGH (ref 11.6–15.2)

## 2015-01-27 LAB — PREPARE RBC (CROSSMATCH)

## 2015-01-27 MED ORDER — SODIUM CHLORIDE 0.9 % IV SOLN
10.0000 mL/h | Freq: Once | INTRAVENOUS | Status: AC
  Administered 2015-01-27: 10 mL/h via INTRAVENOUS

## 2015-01-27 MED ORDER — SODIUM CHLORIDE 0.9 % IV SOLN: INTRAVENOUS | Status: DC

## 2015-01-27 NOTE — ED Provider Notes (Addendum)
CSN: [id number]     Arrival date & time 01/27/15  1025 History   First MD Initiated Contact with Patient 01/27/15 1043     Chief Complaint  Patient presents with  . Anemia     (Consider location/radiation/quality/duration/timing/severity/associated sxs/prior Treatment) Patient is a 69 y.o. male presenting with anemia. The history is provided by the patient.  Anemia Pertinent negatives include no chest pain, no abdominal pain, no headaches and no shortness of breath.  Patient w hx heart and renal transplant, back on dialysis for past few years, presents after referral from hd center for hgb 6.  Pt indicates had labs 2 days ago, and that dialysis center did not want to do hd today without transfusion.  They sent for transfusion 1 unit prbc, and they have arranged to do dialysis tomorrow. Pt states recently has felt mildly 'sluggish'. No fainting. No chest pain or sob. Denies recent blood loss, no rectal bleeding or melena.  States has been worked up for same, and has been getting periodic transfusions for same, last of which was 2 weeks ago.     Past Medical History  Diagnosis Date  . Diabetes mellitus   . Hypertension   . Myocardial infarction 1985; 1990  . Angina   . Dysrhythmia   . DVT (deep venous thrombosis) ~ 12/2010    LLE  . Anemia   . Blood transfusion 06/1999    post heart transplant, last transfusion 2 weeks ago 01-17-15  . Peripheral vascular disease   . Renal failure     Hemodialysis MWF last 2 years, sse dr [name] nephrology, goes to [facility]  . DVT (deep venous thrombosis) 08/25/2014    Nearly occluded R IJ, and L subclavian DVT  . CHF (congestive heart failure) 2001    beffore transplant; had AICD that was explanted 07/19/99 following successful heart transplant  . Coronary artery disease     transplant cardiologist Dr. Corky Mull [facility])  . Pneumonia     resolved 1'16  . Hemodialysis patient     M_W_F- [facility] location  . Organ  transplant     2001-Heart Transplant([facility]), s/p Kidney transplant(failed)  . Mobility impaired     limited standing-uses wheelchair.   Past Surgical History  Procedure Laterality Date  . Nephrectomy transplanted organ  2008  . Av fistula repair      rt. a=fore arm fistula  . Av fistula placement  ~ 01/2010    "this was my 2nd fistula placement"  . Heart transplant  06/1999  . Colonoscopy  03/17/2012    Procedure: COLONOSCOPY;  Surgeon: Lear Ng, MD;  Location: [facility];  Service: Endoscopy;  Laterality: N/A;  . Coronary artery bypass graft  1990    CABG X3  . Colonoscopy with propofol N/A 12/14/2013    Procedure: COLONOSCOPY WITH PROPOFOL;  Surgeon: Lear Ng, MD;  Location: [facility];  Service: Endoscopy;  Laterality: N/A;  . Video bronchoscopy Bilateral 04/14/2014    Procedure: VIDEO BRONCHOSCOPY WITH FLUORO;  Surgeon: Collene Gobble, MD;  Location: [place];  Service: Cardiopulmonary;  Laterality: Bilateral;  . Shuntogram Right 05/09/2014    Procedure: FISTULOGRAM;  Surgeon: Conrad [place], MD;  Location: [facility] CATH LAB;  Service: Cardiovascular;  Laterality: Right;  . Coronary angioplasty with stent placement  1985; 1990    prior stents in the 80's and 90's then CABG; s/p heart transplant 2001 s/p BMS mid LAD and proximal RCA 04/21/13 [facility])  . Av fistula  placement Right 10/20/2014    Procedure: INSERTION OF ARTERIOVENOUS (AV) GORE-TEX GRAFT THIGH;  Surgeon: Conrad [place], MD;  Location: [place];  Service: Vascular;  Laterality: Right;  . Hematoma evacuation Right 11/21/2014    Procedure: EVACUATION HEMATOMA Right Groin;  Surgeon: Conrad Loudon, MD;  Location: [place];  Service: Vascular;  Laterality: Right;  . Application of wound vac Right 11/21/2014    Procedure: APPLICATION OF WOUND VAC Right Groin;  Surgeon: Conrad , MD;  Location: [place];  Service: Vascular;  Laterality: Right;  . Peripheral vascular catheterization N/A 11/25/2014    Procedure: Abdominal  Aortogram;  Surgeon: Elam Dutch, MD;  Location: [place] CV LAB;  Service: Cardiovascular;  Laterality: N/A;  . Lower extremity angiogram Right 11/25/2014    Procedure: Lower Extremity Angiogram;  Surgeon: Elam Dutch, MD;  Location: [place] CV LAB;  Service: Cardiovascular;  Laterality: Right;  . Flexible sigmoidoscopy N/A 12/30/2014    Procedure: FLEXIBLE SIGMOIDOSCOPY;  Surgeon: Ronald Lobo, MD;  Location: [facility] ENDOSCOPY;  Service: Endoscopy;  Laterality: N/A;   Family History  Problem Relation Age of Onset  . Heart disease Mother   . Hyperlipidemia Mother   . Hypertension Mother   . Diabetes Mother   . Hyperlipidemia Father   . Hypertension Father   . Heart disease Father   . Diabetes Brother   . Heart disease Brother     Heart Disease before age 56  . Hyperlipidemia Brother   . Hypertension Brother   . Heart attack Brother    Social History  Substance Use Topics  . Smoking status: Former Smoker -- 1.00 packs/day for 20 years    Types: Cigarettes    Quit date: 05/27/1988  . Smokeless tobacco: Never Used  . Alcohol Use: 0.0 oz/week    0 Standard drinks or equivalent per week     Comment: "Holidays"    Review of Systems  Constitutional: Negative for fever.  HENT: Negative for sore throat.   Eyes: Negative for redness.  Respiratory: Negative for shortness of breath.   Cardiovascular: Negative for chest pain.  Gastrointestinal: Negative for vomiting, abdominal pain and blood in stool.  Genitourinary: Negative for flank pain.  Musculoskeletal: Negative for back pain and neck pain.  Skin: Negative for rash.  Neurological: Negative for headaches.  Hematological: Does not bruise/bleed easily.  Psychiatric/Behavioral: Negative for confusion.      Allergies  Lisinopril; Niacin and related; Norvasc; and Penicillins  Home Medications   Prior to Admission medications   Medication Sig Start Date End Date Taking? Authorizing Provider  atorvastatin  (LIPITOR) 40 MG tablet Take 40 mg by mouth daily.      Historical Provider, MD  azaTHIOprine (IMURAN) 50 MG tablet Take 75 mg by mouth every morning.     Historical Provider, MD  b complex-vitamin c-folic acid (NEPHRO-VITE) 0.8 MG TABS tablet Take 1 tablet by mouth at bedtime.    Historical Provider, MD  calcium acetate (PHOSLO) 667 MG capsule Take 1 capsule (667 mg total) by mouth 2 (two) times daily with a meal. Patient not taking: Reported on 01/12/2015 12/14/14   Ivan Anchors Love, PA-C  carvedilol (COREG) 6.25 MG tablet Take 1 tablet (6.25 mg total) by mouth at bedtime. 04/15/14   Geradine Girt, DO  colesevelam (WELCHOL) 625 MG tablet Take 3,750 mg by mouth at bedtime. Takes 6 tabs daily    Historical Provider, MD  Darbepoetin Alfa (ARANESP) 200 MCG/0.4ML SOSY injection Inject 0.4 mLs (  200 mcg total) into the vein every Wednesday with hemodialysis. 12/31/14   Allie Bossier, MD  doxercalciferol (HECTOROL) 4 MCG/2ML injection Inject 1.5 mLs (3 mcg total) into the vein every Monday, Wednesday, and Friday with hemodialysis. 12/31/14   Allie Bossier, MD  HUMALOG KWIKPEN 100 UNIT/ML SOPN Inject 4-10 Units into the skin 2 (two) times daily. Per sliding scale 11/17/12   Historical Provider, MD  isosorbide dinitrate (ISOCHRON) 40 MG CR tablet Take 40 mg by mouth every 8 (eight) hours. 10/03/14   Historical Provider, MD  oxyCODONE (OXY IR/ROXICODONE) 5 MG immediate release tablet Take 1 tablet (5 mg total) by mouth every 6 (six) hours as needed for moderate pain. 12/14/14   Bary Leriche, PA-C  pantoprazole (PROTONIX) 40 MG tablet Take 1 tablet (40 mg total) by mouth daily. Patient not taking: Reported on 01/12/2015 12/02/14   Silver Huguenin Elgergawy, MD  pantoprazole (PROTONIX) 40 MG tablet Take 1 tablet (40 mg total) by mouth daily. Patient not taking: Reported on 01/12/2015 12/31/14   Allie Bossier, MD  polyethylene glycol [facility] Floria Raveling) packet Take 17 g by mouth daily. 12/31/14   Allie Bossier, MD  predniSONE  (DELTASONE) 5 MG tablet Take 1 tablet (5 mg total) by mouth daily with breakfast. 08/29/14   Orson Eva, MD  senna-docusate (SENOKOT-S) 8.6-50 MG per tablet Take 3 tablets by mouth 2 (two) times daily. 12/14/14   Bary Leriche, PA-C  Sirolimus 0.5 MG TABS Take 3 tablets by mouth every morning.  10/10/14   Historical Provider, MD  Vancomycin (VANCOCIN) 750 MG/150ML SOLN Inject 150 mLs (750 mg total) into the vein every Monday, Wednesday, and Friday with hemodialysis. 12/31/14   Allie Bossier, MD  warfarin (COUMADIN) 5 MG tablet Please consult: Inpatient pharmacy to continue dosing warfarin and monitor Patient taking differently: Take 5 mg by mouth at bedtime. Please consult: Inpatient pharmacy to continue dosing warfarin and monitor 12/02/14   Silver Huguenin Elgergawy, MD   BP 150/54 mmHg  Pulse 87  Temp(Src) 98 F (36.7 C) (Oral)  SpO2 96% Physical Exam  Constitutional: He is oriented to person, place, and time. He appears well-developed and well-nourished. No distress.  HENT:  Mouth/Throat: Oropharynx is clear and moist.  Eyes: Conjunctivae are normal. No scleral icterus.  Neck: Neck supple. No tracheal deviation present.  Cardiovascular: Normal rate, regular rhythm, normal heart sounds and intact distal pulses.   Pulmonary/Chest: Effort normal and breath sounds normal. No accessory muscle usage. No respiratory distress.  Abdominal: Soft. Bowel sounds are normal. He exhibits no distension. There is no tenderness.  Musculoskeletal: Normal range of motion. He exhibits no edema or tenderness.  Hd graft left prox thigh w palp thrill. No sign of infection.   Neurological: He is alert and oriented to person, place, and time.  Skin: Skin is warm and dry. He is not diaphoretic.  Psychiatric: He has a normal mood and affect.  Nursing note and vitals reviewed.   ED Course  Procedures (including critical care time) Labs Review   Results for orders placed or performed during the hospital encounter of  70/01/74  Basic metabolic panel  Result Value Ref Range   Sodium 135 135 - 145 mmol/L   Potassium 3.2 (L) 3.5 - 5.1 mmol/L   Chloride 96 (L) 101 - 111 mmol/L   CO2 29 22 - 32 mmol/L   Glucose, Bld 81 65 - 99 mg/dL   BUN 27 (H) 6 - 20 mg/dL  Creatinine, Ser 6.18 (H) 0.61 - 1.24 mg/dL   Calcium 8.5 (L) 8.9 - 10.3 mg/dL   GFR calc non Af Amer 8 (L) >60 mL/min   GFR calc Af Amer 10 (L) >60 mL/min   Anion gap 10 5 - 15  CBC  Result Value Ref Range   WBC 2.9 (L) 4.0 - 10.5 K/uL   RBC 2.14 (L) 4.22 - 5.81 MIL/uL   Hemoglobin 6.1 (LL) 13.0 - 17.0 g/dL   HCT 19.0 (L) 39.0 - 52.0 %   MCV 88.8 78.0 - 100.0 fL   MCH 28.5 26.0 - 34.0 pg   MCHC 32.1 30.0 - 36.0 g/dL   RDW 16.0 (H) 11.5 - 15.5 %   Platelets 44 (L) 150 - 400 K/uL  Protime-INR  Result Value Ref Range   Prothrombin Time 24.6 (H) 11.6 - 15.2 seconds   INR 2.24 (H) 0.00 - 1.49  Type and screen  Result Value Ref Range   ABO/RH(D) O POS    Antibody Screen NEG    Sample Expiration 01/30/2015    Unit Number [id number]    Blood Component Type RBC LR PHER2    Unit division 00    Status of Unit ALLOCATED    Transfusion Status OK TO TRANSFUSE    Crossmatch Result Compatible    Unit Number [id number]    Blood Component Type RED CELLS,LR    Unit division 00    Status of Unit ALLOCATED    Transfusion Status OK TO TRANSFUSE    Crossmatch Result Compatible   Prepare RBC  Result Value Ref Range   Order Confirmation ORDER PROCESSED BY BLOOD BANK       MDM   Iv ns. Labs.  Reviewed nursing notes and prior charts for additional history.   hgb 6.  Stool v light brown, sent for hemoccult.  Transfuse 2 units prbc.  Pt confirms has HD arranged for tomorrow AM.  Recheck pt comfortable, no distress. Transfusing prbc.  At shift change, signed out to oncoming provider, Dr Maryan Rued,  to recheck pt post transfusion and dispo appropriately.        Lajean Saver, MD 01/27/15 [phone]

## 2015-01-27 NOTE — ED Notes (Addendum)
y set with filter replaced.

## 2015-01-27 NOTE — ED Notes (Signed)
HGB of 6.1 reported to MD.

## 2015-01-27 NOTE — ED Notes (Signed)
Patient  received a call from his HD center stating that his HGB was 6.9.  He was told to come to the ED for a transfusion.Brandon Sharp

## 2015-01-27 NOTE — Discharge Instructions (Signed)
It was our pleasure to provide your ER care today - we hope that you feel better.  Follow up for dialysis tomorrow as planned.  Return to ER right away if worse, new symptoms, fevers, trouble breathing, weak/fainting, other concern.

## 2015-01-30 ENCOUNTER — Encounter (HOSPITAL_COMMUNITY): Payer: Self-pay | Admitting: Emergency Medicine

## 2015-01-30 ENCOUNTER — Emergency Department (HOSPITAL_COMMUNITY): Payer: Medicare Other

## 2015-01-30 ENCOUNTER — Inpatient Hospital Stay (HOSPITAL_COMMUNITY)
Admission: EM | Admit: 2015-01-30 | Discharge: 2015-02-03 | DRG: 480 | Disposition: A | Discharge status: Other Acute Inpatient Hospital | Payer: Medicare Other | Attending: Internal Medicine | Admitting: Internal Medicine

## 2015-01-30 DIAGNOSIS — Z792 Long term (current) use of antibiotics: Secondary | ICD-10-CM | POA: Diagnosis not present

## 2015-01-30 DIAGNOSIS — E1129 Type 2 diabetes mellitus with other diabetic kidney complication: Secondary | ICD-10-CM | POA: Diagnosis present

## 2015-01-30 DIAGNOSIS — Z880 Allergy status to penicillin: Secondary | ICD-10-CM

## 2015-01-30 DIAGNOSIS — I071 Rheumatic tricuspid insufficiency: Secondary | ICD-10-CM | POA: Diagnosis not present

## 2015-01-30 DIAGNOSIS — Z79899 Other long term (current) drug therapy: Secondary | ICD-10-CM

## 2015-01-30 DIAGNOSIS — E1151 Type 2 diabetes mellitus with diabetic peripheral angiopathy without gangrene: Secondary | ICD-10-CM | POA: Diagnosis present

## 2015-01-30 DIAGNOSIS — Z941 Heart transplant status: Secondary | ICD-10-CM | POA: Diagnosis not present

## 2015-01-30 DIAGNOSIS — Z992 Dependence on renal dialysis: Secondary | ICD-10-CM | POA: Diagnosis not present

## 2015-01-30 DIAGNOSIS — W1830XA Fall on same level, unspecified, initial encounter: Secondary | ICD-10-CM | POA: Diagnosis present

## 2015-01-30 DIAGNOSIS — Z6820 Body mass index (BMI) 20.0-20.9, adult: Secondary | ICD-10-CM | POA: Diagnosis not present

## 2015-01-30 DIAGNOSIS — N2581 Secondary hyperparathyroidism of renal origin: Secondary | ICD-10-CM | POA: Diagnosis present

## 2015-01-30 DIAGNOSIS — S72142A Displaced intertrochanteric fracture of left femur, initial encounter for closed fracture: Secondary | ICD-10-CM | POA: Diagnosis present

## 2015-01-30 DIAGNOSIS — Z01810 Encounter for preprocedural cardiovascular examination: Secondary | ICD-10-CM

## 2015-01-30 DIAGNOSIS — D631 Anemia in chronic kidney disease: Secondary | ICD-10-CM | POA: Diagnosis present

## 2015-01-30 DIAGNOSIS — N189 Chronic kidney disease, unspecified: Secondary | ICD-10-CM | POA: Diagnosis not present

## 2015-01-30 DIAGNOSIS — D649 Anemia, unspecified: Secondary | ICD-10-CM | POA: Insufficient documentation

## 2015-01-30 DIAGNOSIS — I252 Old myocardial infarction: Secondary | ICD-10-CM

## 2015-01-30 DIAGNOSIS — Z8249 Family history of ischemic heart disease and other diseases of the circulatory system: Secondary | ICD-10-CM | POA: Diagnosis not present

## 2015-01-30 DIAGNOSIS — S72002A Fracture of unspecified part of neck of left femur, initial encounter for closed fracture: Secondary | ICD-10-CM | POA: Diagnosis not present

## 2015-01-30 DIAGNOSIS — D899 Disorder involving the immune mechanism, unspecified: Secondary | ICD-10-CM

## 2015-01-30 DIAGNOSIS — Z86718 Personal history of other venous thrombosis and embolism: Secondary | ICD-10-CM | POA: Diagnosis not present

## 2015-01-30 DIAGNOSIS — K5900 Constipation, unspecified: Secondary | ICD-10-CM | POA: Diagnosis present

## 2015-01-30 DIAGNOSIS — Z66 Do not resuscitate: Secondary | ICD-10-CM | POA: Diagnosis present

## 2015-01-30 DIAGNOSIS — Z7901 Long term (current) use of anticoagulants: Secondary | ICD-10-CM

## 2015-01-30 DIAGNOSIS — Z833 Family history of diabetes mellitus: Secondary | ICD-10-CM | POA: Diagnosis not present

## 2015-01-30 DIAGNOSIS — Z7952 Long term (current) use of systemic steroids: Secondary | ICD-10-CM

## 2015-01-30 DIAGNOSIS — Y92009 Unspecified place in unspecified non-institutional (private) residence as the place of occurrence of the external cause: Secondary | ICD-10-CM

## 2015-01-30 DIAGNOSIS — E43 Unspecified severe protein-calorie malnutrition: Secondary | ICD-10-CM | POA: Diagnosis present

## 2015-01-30 DIAGNOSIS — E1122 Type 2 diabetes mellitus with diabetic chronic kidney disease: Secondary | ICD-10-CM | POA: Diagnosis not present

## 2015-01-30 DIAGNOSIS — Z951 Presence of aortocoronary bypass graft: Secondary | ICD-10-CM | POA: Diagnosis not present

## 2015-01-30 DIAGNOSIS — I120 Hypertensive chronic kidney disease with stage 5 chronic kidney disease or end stage renal disease: Secondary | ICD-10-CM | POA: Diagnosis not present

## 2015-01-30 DIAGNOSIS — Z87891 Personal history of nicotine dependence: Secondary | ICD-10-CM | POA: Diagnosis not present

## 2015-01-30 DIAGNOSIS — E1121 Type 2 diabetes mellitus with diabetic nephropathy: Secondary | ICD-10-CM | POA: Diagnosis not present

## 2015-01-30 DIAGNOSIS — Z955 Presence of coronary angioplasty implant and graft: Secondary | ICD-10-CM | POA: Diagnosis not present

## 2015-01-30 DIAGNOSIS — Z794 Long term (current) use of insulin: Secondary | ICD-10-CM

## 2015-01-30 DIAGNOSIS — N186 End stage renal disease: Secondary | ICD-10-CM

## 2015-01-30 DIAGNOSIS — I10 Essential (primary) hypertension: Secondary | ICD-10-CM | POA: Diagnosis present

## 2015-01-30 DIAGNOSIS — T8612 Kidney transplant failure: Secondary | ICD-10-CM | POA: Diagnosis present

## 2015-01-30 DIAGNOSIS — D696 Thrombocytopenia, unspecified: Secondary | ICD-10-CM | POA: Diagnosis not present

## 2015-01-30 DIAGNOSIS — D61818 Other pancytopenia: Secondary | ICD-10-CM | POA: Diagnosis not present

## 2015-01-30 DIAGNOSIS — D849 Immunodeficiency, unspecified: Secondary | ICD-10-CM | POA: Diagnosis present

## 2015-01-30 DIAGNOSIS — Z888 Allergy status to other drugs, medicaments and biological substances status: Secondary | ICD-10-CM

## 2015-01-30 DIAGNOSIS — E440 Moderate protein-calorie malnutrition: Secondary | ICD-10-CM | POA: Insufficient documentation

## 2015-01-30 DIAGNOSIS — Z419 Encounter for procedure for purposes other than remedying health state, unspecified: Secondary | ICD-10-CM

## 2015-01-30 DIAGNOSIS — M79605 Pain in left leg: Secondary | ICD-10-CM | POA: Diagnosis present

## 2015-01-30 DIAGNOSIS — D6959 Other secondary thrombocytopenia: Secondary | ICD-10-CM | POA: Diagnosis not present

## 2015-01-30 HISTORY — DX: Personal history of other infectious and parasitic diseases: Z86.19

## 2015-01-30 HISTORY — DX: End stage renal disease: N18.6

## 2015-01-30 HISTORY — DX: Kidney transplant status: Z94.0

## 2015-01-30 HISTORY — DX: Heart transplant status: Z94.1

## 2015-01-30 HISTORY — DX: Cardiac allograft vasculopathy: T86.290

## 2015-01-30 HISTORY — DX: Type 2 diabetes mellitus without complications: E11.9

## 2015-01-30 HISTORY — DX: Ischemic cardiomyopathy: I25.5

## 2015-01-30 HISTORY — DX: Personal history of other venous thrombosis and embolism: Z86.718

## 2015-01-30 HISTORY — DX: Essential (primary) hypertension: I10

## 2015-01-30 LAB — TYPE AND SCREEN
ABO/RH(D): O POS
ABO/RH(D): O POS
ANTIBODY SCREEN: NEGATIVE
ANTIBODY SCREEN: NEGATIVE
UNIT DIVISION: 0
Unit division: 0
Unit division: 0
Unit division: 0

## 2015-01-30 LAB — CBC WITH DIFFERENTIAL/PLATELET
BASOS ABS: 0 10*3/uL (ref 0.0–0.1)
BASOS ABS: 0 10*3/uL (ref 0.0–0.1)
Basophils Relative: 0 % (ref 0–1)
Basophils Relative: 0 % (ref 0–1)
EOS ABS: 0 10*3/uL (ref 0.0–0.7)
EOS ABS: 0 10*3/uL (ref 0.0–0.7)
EOS PCT: 1 % (ref 0–5)
EOS PCT: 1 % (ref 0–5)
HCT: 22.8 % — ABNORMAL LOW (ref 39.0–52.0)
HCT: 21.2 % — ABNORMAL LOW (ref 39.0–52.0)
Hemoglobin: 7.4 g/dL — ABNORMAL LOW (ref 13.0–17.0)
Hemoglobin: 6.9 g/dL — CL (ref 13.0–17.0)
LYMPHS PCT: 42 % (ref 12–46)
Lymphocytes Relative: 47 % — ABNORMAL HIGH (ref 12–46)
Lymphs Abs: 1.6 10*3/uL (ref 0.7–4.0)
Lymphs Abs: 1.4 10*3/uL (ref 0.7–4.0)
MCH: 29.2 pg (ref 26.0–34.0)
MCH: 28.9 pg (ref 26.0–34.0)
MCHC: 32.5 g/dL (ref 30.0–36.0)
MCHC: 32.5 g/dL (ref 30.0–36.0)
MCV: 90.1 fL (ref 78.0–100.0)
MCV: 88.7 fL (ref 78.0–100.0)
Monocytes Absolute: 0.1 10*3/uL (ref 0.1–1.0)
Monocytes Absolute: 0.2 10*3/uL (ref 0.1–1.0)
Monocytes Relative: 3 % (ref 3–12)
Monocytes Relative: 6 % (ref 3–12)
Neutro Abs: 2.1 10*3/uL (ref 1.7–7.7)
Neutro Abs: 1.4 10*3/uL — ABNORMAL LOW (ref 1.7–7.7)
Neutrophils Relative %: 54 % (ref 43–77)
Neutrophils Relative %: 47 % (ref 43–77)
PLATELETS: 56 10*3/uL — AB (ref 150–400)
PLATELETS: 34 10*3/uL — AB (ref 150–400)
RBC: 2.53 MIL/uL — AB (ref 4.22–5.81)
RBC: 2.39 MIL/uL — AB (ref 4.22–5.81)
RDW: 15.6 % — ABNORMAL HIGH (ref 11.5–15.5)
RDW: 15.5 % (ref 11.5–15.5)
WBC: 3.8 10*3/uL — AB (ref 4.0–10.5)
WBC: 3 10*3/uL — AB (ref 4.0–10.5)

## 2015-01-30 LAB — RENAL FUNCTION PANEL
ALBUMIN: 2.2 g/dL — AB (ref 3.5–5.0)
ANION GAP: 8 (ref 5–15)
BUN: 31 mg/dL — ABNORMAL HIGH (ref 6–20)
CALCIUM: 8.3 mg/dL — AB (ref 8.9–10.3)
CO2: 30 mmol/L (ref 22–32)
CREATININE: 6.24 mg/dL — AB (ref 0.61–1.24)
Chloride: 98 mmol/L — ABNORMAL LOW (ref 101–111)
GFR calc Af Amer: 9 mL/min — ABNORMAL LOW (ref >60)
GFR calc non Af Amer: 8 mL/min — ABNORMAL LOW (ref >60)
GLUCOSE: 113 mg/dL — AB (ref 65–99)
PHOSPHORUS: 3 mg/dL (ref 2.5–4.6)
Potassium: 3.6 mmol/L (ref 3.5–5.1)
SODIUM: 136 mmol/L (ref 135–145)

## 2015-01-30 LAB — BASIC METABOLIC PANEL
Anion gap: 9 (ref 5–15)
BUN: 29 mg/dL — AB (ref 6–20)
CO2: 29 mmol/L (ref 22–32)
Calcium: 8.4 mg/dL — ABNORMAL LOW (ref 8.9–10.3)
Chloride: 97 mmol/L — ABNORMAL LOW (ref 101–111)
Creatinine, Ser: 5.64 mg/dL — ABNORMAL HIGH (ref 0.61–1.24)
GFR, EST AFRICAN AMERICAN: 11 mL/min — AB (ref >60)
GFR, EST NON AFRICAN AMERICAN: 9 mL/min — AB (ref >60)
Glucose, Bld: 131 mg/dL — ABNORMAL HIGH (ref 65–99)
POTASSIUM: 3.8 mmol/L (ref 3.5–5.1)
SODIUM: 135 mmol/L (ref 135–145)

## 2015-01-30 LAB — PROTIME-INR
INR: 2.11 — ABNORMAL HIGH (ref 0.00–1.49)
INR: 2.21 — ABNORMAL HIGH (ref 0.00–1.49)
INR: 1.57 — AB (ref 0.00–1.49)
PROTHROMBIN TIME: 23.5 s — AB (ref 11.6–15.2)
PROTHROMBIN TIME: 24.3 s — AB (ref 11.6–15.2)
PROTHROMBIN TIME: 18.8 s — AB (ref 11.6–15.2)

## 2015-01-30 LAB — PREPARE RBC (CROSSMATCH)

## 2015-01-30 LAB — GLUCOSE, CAPILLARY
GLUCOSE-CAPILLARY: 120 mg/dL — AB (ref 65–99)
GLUCOSE-CAPILLARY: 128 mg/dL — AB (ref 65–99)
Glucose-Capillary: 100 mg/dL — ABNORMAL HIGH (ref 65–99)
Glucose-Capillary: 84 mg/dL (ref 65–99)

## 2015-01-30 LAB — MRSA PCR SCREENING: MRSA by PCR: NEGATIVE

## 2015-01-30 MED ORDER — INFLUENZA VAC SPLIT QUAD 0.5 ML IM SUSY
0.5000 mL | PREFILLED_SYRINGE | INTRAMUSCULAR | Status: DC
  Filled 2015-01-30: qty 0.5

## 2015-01-30 MED ORDER — LIDOCAINE HCL (PF) 1 % IJ SOLN: 5.0000 mL | INTRAMUSCULAR | Status: DC | PRN

## 2015-01-30 MED ORDER — LIDOCAINE-PRILOCAINE 2.5-2.5 % EX CREA
1.0000 "application " | TOPICAL_CREAM | CUTANEOUS | Status: DC | PRN
  Filled 2015-01-30: qty 5

## 2015-01-30 MED ORDER — SODIUM CHLORIDE 0.45 % IV SOLN: INTRAVENOUS | Status: DC

## 2015-01-30 MED ORDER — DEXTROSE 5 % IV SOLN
500.0000 mg | Freq: Four times a day (QID) | INTRAVENOUS | Status: DC | PRN
  Filled 2015-01-30: qty 5

## 2015-01-30 MED ORDER — ISOSORBIDE DINITRATE ER 40 MG PO CPCR
40.0000 mg | ORAL_CAPSULE | Freq: Three times a day (TID) | ORAL | Status: DC
Start: 2015-01-30 — End: 2015-02-03
  Administered 2015-01-30 – 2015-02-03 (×10): 40 mg via ORAL
  Filled 2015-01-30 (×20): qty 1

## 2015-01-30 MED ORDER — SODIUM CHLORIDE 0.9 % IV SOLN: 100.0000 mL | INTRAVENOUS | Status: DC | PRN

## 2015-01-30 MED ORDER — ATORVASTATIN CALCIUM 40 MG PO TABS
40.0000 mg | ORAL_TABLET | Freq: Every evening | ORAL | Status: DC
  Administered 2015-01-30 – 2015-02-03 (×5): 40 mg via ORAL
  Filled 2015-01-30 (×5): qty 1

## 2015-01-30 MED ORDER — MORPHINE SULFATE (PF) 2 MG/ML IV SOLN
2.0000 mg | INTRAVENOUS | Status: DC | PRN
  Administered 2015-01-30 – 2015-01-31 (×4): 2 mg via INTRAVENOUS
  Filled 2015-01-30 (×4): qty 1

## 2015-01-30 MED ORDER — INSULIN ASPART 100 UNIT/ML ~~LOC~~ SOLN
0.0000 [IU] | Freq: Four times a day (QID) | SUBCUTANEOUS | Status: DC
  Administered 2015-01-30 – 2015-02-02 (×2): 1 [IU] via SUBCUTANEOUS
  Administered 2015-02-02: 2 [IU] via SUBCUTANEOUS
  Administered 2015-02-03 (×2): 1 [IU] via SUBCUTANEOUS

## 2015-01-30 MED ORDER — SODIUM CHLORIDE 0.45 % IV SOLN
INTRAVENOUS | Status: DC
  Administered 2015-01-31: 10:00:00 via INTRAVENOUS

## 2015-01-30 MED ORDER — PANTOPRAZOLE SODIUM 40 MG PO TBEC
40.0000 mg | DELAYED_RELEASE_TABLET | Freq: Every day | ORAL | Status: DC
  Administered 2015-01-30 – 2015-02-03 (×5): 40 mg via ORAL
  Filled 2015-01-30 (×5): qty 1

## 2015-01-30 MED ORDER — DOXERCALCIFEROL 4 MCG/2ML IV SOLN
INTRAVENOUS | Status: AC
  Administered 2015-01-30: 3 ug
  Filled 2015-01-30: qty 2

## 2015-01-30 MED ORDER — PREDNISONE 5 MG PO TABS
5.0000 mg | ORAL_TABLET | Freq: Every day | ORAL | Status: DC
  Administered 2015-01-30 – 2015-02-02 (×4): 5 mg via ORAL
  Filled 2015-01-30 (×5): qty 1

## 2015-01-30 MED ORDER — CLINDAMYCIN PHOSPHATE 900 MG/50ML IV SOLN
900.0000 mg | INTRAVENOUS | Status: AC
  Administered 2015-01-31: 900 mg via INTRAVENOUS
  Filled 2015-01-30: qty 50

## 2015-01-30 MED ORDER — HYDROMORPHONE HCL 1 MG/ML IJ SOLN
1.0000 mg | Freq: Once | INTRAMUSCULAR | Status: DC
Start: 2015-01-30 — End: 2015-02-03
  Filled 2015-01-30: qty 1

## 2015-01-30 MED ORDER — PRO-STAT SUGAR FREE PO LIQD
30.0000 mL | Freq: Every day | ORAL | Status: DC
  Administered 2015-02-01 – 2015-02-02 (×2): 30 mL via ORAL
  Filled 2015-01-30: qty 30

## 2015-01-30 MED ORDER — DARBEPOETIN ALFA 200 MCG/0.4ML IJ SOSY
200.0000 ug | PREFILLED_SYRINGE | INTRAMUSCULAR | Status: DC
  Administered 2015-02-01: 200 ug via INTRAVENOUS
  Filled 2015-01-30: qty 0.4

## 2015-01-30 MED ORDER — CALCIUM ACETATE (PHOS BINDER) 667 MG PO CAPS
667.0000 mg | ORAL_CAPSULE | Freq: Two times a day (BID) | ORAL | Status: DC
  Administered 2015-02-01 – 2015-02-03 (×6): 667 mg via ORAL
  Filled 2015-01-30 (×8): qty 1

## 2015-01-30 MED ORDER — AZATHIOPRINE 50 MG PO TABS
75.0000 mg | ORAL_TABLET | Freq: Every morning | ORAL | Status: DC
  Administered 2015-01-30 – 2015-02-02 (×4): 75 mg via ORAL
  Filled 2015-01-30 (×4): qty 2

## 2015-01-30 MED ORDER — OXYCODONE HCL 5 MG PO TABS
5.0000 mg | ORAL_TABLET | Freq: Four times a day (QID) | ORAL | Status: DC | PRN
  Administered 2015-01-30 – 2015-02-02 (×5): 5 mg via ORAL
  Filled 2015-01-30 (×5): qty 1

## 2015-01-30 MED ORDER — ACETAMINOPHEN 325 MG PO TABS
ORAL_TABLET | ORAL | Status: AC
Start: 2015-01-30 — End: 2015-01-30
  Administered 2015-01-30: 325 mg
  Filled 2015-01-30: qty 2

## 2015-01-30 MED ORDER — METHOCARBAMOL 500 MG PO TABS
500.0000 mg | ORAL_TABLET | Freq: Four times a day (QID) | ORAL | Status: DC | PRN
  Administered 2015-01-30 – 2015-01-31 (×2): 500 mg via ORAL
  Filled 2015-01-30 (×2): qty 1

## 2015-01-30 MED ORDER — SIROLIMUS 0.5 MG PO TABS
1.5000 mg | ORAL_TABLET | Freq: Every morning | ORAL | Status: DC
  Administered 2015-01-30 – 2015-01-31 (×2): 1.5 mg via ORAL
  Filled 2015-01-30 (×3): qty 3

## 2015-01-30 MED ORDER — ALTEPLASE 2 MG IJ SOLR
2.0000 mg | Freq: Once | INTRAMUSCULAR | Status: DC | PRN
  Filled 2015-01-30: qty 2

## 2015-01-30 MED ORDER — SODIUM CHLORIDE 0.9 % IV SOLN: Freq: Once | INTRAVENOUS | Status: DC

## 2015-01-30 MED ORDER — COLESEVELAM HCL 625 MG PO TABS
1875.0000 mg | ORAL_TABLET | Freq: Two times a day (BID) | ORAL | Status: DC
  Administered 2015-01-30 – 2015-02-03 (×6): 1875 mg via ORAL
  Filled 2015-01-30 (×8): qty 3

## 2015-01-30 MED ORDER — OXYCODONE HCL 5 MG PO TABS
5.0000 mg | ORAL_TABLET | Freq: Four times a day (QID) | ORAL | Status: DC | PRN
  Administered 2015-02-02: 5 mg via ORAL
  Filled 2015-01-30: qty 1

## 2015-01-30 MED ORDER — FENTANYL CITRATE (PF) 100 MCG/2ML IJ SOLN
50.0000 ug | INTRAMUSCULAR | Status: AC | PRN
  Administered 2015-01-30 (×2): 50 ug via INTRAVENOUS
  Filled 2015-01-30 (×2): qty 2

## 2015-01-30 MED ORDER — CARVEDILOL 6.25 MG PO TABS
6.2500 mg | ORAL_TABLET | Freq: Every day | ORAL | Status: DC
  Administered 2015-01-30 – 2015-02-02 (×4): 6.25 mg via ORAL
  Filled 2015-01-30 (×4): qty 1

## 2015-01-30 MED ORDER — SENNOSIDES-DOCUSATE SODIUM 8.6-50 MG PO TABS
3.0000 | ORAL_TABLET | Freq: Two times a day (BID) | ORAL | Status: DC
  Administered 2015-01-30 – 2015-02-02 (×7): 3 via ORAL
  Filled 2015-01-30 (×6): qty 3

## 2015-01-30 MED ORDER — CLINDAMYCIN PHOSPHATE 900 MG/50ML IV SOLN: 900.0000 mg | INTRAVENOUS | Status: DC

## 2015-01-30 MED ORDER — BOOST / RESOURCE BREEZE PO LIQD
1.0000 | Freq: Two times a day (BID) | ORAL | Status: DC
Start: 2015-01-31 — End: 2015-02-03
  Administered 2015-02-02 – 2015-02-03 (×3): 1 via ORAL

## 2015-01-30 MED ORDER — HEPARIN SODIUM (PORCINE) 1000 UNIT/ML DIALYSIS: 1000.0000 [IU] | INTRAMUSCULAR | Status: DC | PRN

## 2015-01-30 MED ORDER — DOXERCALCIFEROL 4 MCG/2ML IV SOLN
3.0000 ug | INTRAVENOUS | Status: DC
  Administered 2015-02-01: 3 ug via INTRAVENOUS
  Filled 2015-01-30 (×3): qty 2

## 2015-01-30 MED ORDER — HYDROMORPHONE HCL 1 MG/ML IJ SOLN
1.0000 mg | Freq: Once | INTRAMUSCULAR | Status: AC
  Administered 2015-01-30: 1 mg via INTRAVENOUS

## 2015-01-30 MED ORDER — PENTAFLUOROPROP-TETRAFLUOROETH EX AERO: 1.0000 "application " | INHALATION_SPRAY | CUTANEOUS | Status: DC | PRN

## 2015-01-30 MED ORDER — ISOSORBIDE DINITRATE ER 40 MG PO TBCR
40.0000 mg | EXTENDED_RELEASE_TABLET | Freq: Three times a day (TID) | ORAL | Status: DC
  Filled 2015-01-30 (×4): qty 1

## 2015-01-30 MED ORDER — MORPHINE SULFATE (PF) 4 MG/ML IV SOLN
4.0000 mg | Freq: Once | INTRAVENOUS | Status: AC
  Administered 2015-01-30: 4 mg via INTRAVENOUS
  Filled 2015-01-30: qty 1

## 2015-01-30 MED ORDER — VITAMIN K1 10 MG/ML IJ SOLN
5.0000 mg | Freq: Once | INTRAVENOUS | Status: AC
  Administered 2015-01-30: 5 mg via INTRAVENOUS
  Filled 2015-01-30 (×2): qty 0.5

## 2015-01-30 NOTE — ED Notes (Addendum)
Pt from home c/o falling as he was leaving his bed. Pt reports he is having left lower leg pain and pain above the left knee. Pt is unable to straighten his leg. Pt has edema to feet. He is a dialysis patient (m,w,F)

## 2015-01-30 NOTE — Progress Notes (Signed)
Initial Nutrition Assessment  DOCUMENTATION CODES:   Non-severe (moderate) malnutrition in context of chronic illness  INTERVENTION:   Once diet advances,  Provide Boost Breeze po BID, each supplement provides 250 kcal and 9 grams of protein.  Provide 30 ml Prostat po once daily, each provides 100 kcal and 15 grams of protein.   NUTRITION DIAGNOSIS:   Malnutrition related to chronic illness as evidenced by severe depletion of muscle mass, moderate depletion of body fat.  GOAL:   Patient will meet greater than or equal to 90% of their needs  MONITOR:   Diet advancement, Weight trends, Labs, I & O's  REASON FOR ASSESSMENT:   Malnutrition Screening Tool    ASSESSMENT:   69 y.o. 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 Presents after fall.   Pt is currently NPO for surgery today. Pt reports eating well PTA with no other difficulties. Weight has been fluctuating. Pt is agreeable to nutritional supplements once diet advances. RD to order.   Nutrition-Focused physical exam completed. Findings are moderate fat depletion, moderate to severe muscle depletion, and mild edema.   Labs and medications reviewed.   Diet Order:  Diet NPO time specified Diet NPO time specified Except for: Ice Chips, Sips with Meds  Skin:   (non-pitting UE, +1 LE edema)  Last BM:  9/4  Height:   Ht Readings from Last 1 Encounters:  01/12/15 6' (1.829 m)    Weight:   Wt Readings from Last 1 Encounters:  01/30/15 156 lb 8.4 oz (71 kg)    Ideal Body Weight:  81 kg  BMI:  Body mass index is 21.22 kg/(m^2).  Estimated Nutritional Needs:   Kcal:  2100-2300  Protein:  100-120  grams  Fluid:  1.2 L/day  EDUCATION NEEDS:   No education needs identified at this time  Corrin Parker, MS, RD, LDN Pager # [phone] After hours/ weekend pager # [phone]

## 2015-01-30 NOTE — ED Notes (Signed)
Bed: FE07 Expected date:  Expected time:  Means of arrival:  Comments: EMS 69 yo male left lower leg pain s/p fall 45 minutes ago-unable to ambulate

## 2015-01-30 NOTE — Consult Note (Signed)
Primary cardiologist: Dr. Corky Mull, [facility] Consulting cardiologist: Dr. Satira Sark  Reason for consultation: Preoperative evaluation  Clinical Summary Brandon Sharp is a medically complex 68 y.o.male with history outlined below, currently admitted to the hospital after a mechanical fall with resulting closed left hip fracture. Patient denies any precipitating symptoms, no syncope, states that he was ambulating to bed with his walker and lost his footing. He is tentatively scheduled for hip surgery tomorrow afternoon at 4 PM with Dr. Sharol Given.  We are consulted for perioperative cardiac evaluation. Ms. Daniello is followed at [facility] by Dr. 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 He is on chronic immunosuppressant therapy, underwent cardiac catheterization in May 2015 that demonstrated 40% distal left main, mild atherosclerosis within the LAD and ramus, 50% proximal circumflex, and 50% proximal RCA (in-stent). LVEF was 50-55% at that time.  He reports no angina symptoms with typical ADLs except at times when exacerbated by anemia. He does have functional limitations and uses a walker, sometimes a wheelchair. He states he tolerates his hemodialysis sessions without major limitations related to hypotension or angina. In addition to his transplant medical regimen, he is also on Lipitor, WelChol and Coreg.   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 Lorrene Reid. Complex hospital stay at [facility] noted in August, please refer to discharge summary for details.   Allergies  Allergen Reactions  . Lisinopril Swelling      Lips and tongue swell  . Niacin And Related Other (See Comments)    unknown  . Norvasc [Amlodipine Besylate] Rash    Flushing  . Penicillins Rash    Medications Scheduled Medications: . sodium chloride   Intravenous Once  . sodium chloride   Intravenous Once  . atorvastatin  40 mg Oral QPM  . azaTHIOprine  75 mg Oral q morning - 10a  . calcium acetate  667 mg Oral BID WC  . carvedilol  6.25 mg Oral QHS  . colesevelam  1,875 mg Oral BID WC  . [START ON 02/01/2015] darbepoetin (ARANESP) injection - DIALYSIS  200 mcg Intravenous Q Wed-HD  . doxercalciferol  3 mcg Intravenous Q M,W,F-HD  . [START ON 01/31/2015] feeding supplement  1 Container Oral BID BM  . [START ON 01/31/2015] feeding supplement (PRO-STAT SUGAR FREE 64)  30 mL Oral Q1500  .  HYDROmorphone (DILAUDID) injection  1 mg Intramuscular Once  . [START ON 01/31/2015] Influenza vac split quadrivalent PF  0.5 mL Intramuscular Tomorrow-1000  . insulin aspart  0-9 Units Subcutaneous Q6H  . isosorbide dinitrate  40 mg Oral 3 times per day  . pantoprazole  40 mg Oral Daily  . phytonadione (VITAMIN K) IV  5 mg Intravenous Once  . predniSONE  5 mg Oral Q breakfast  . senna-docusate  3 tablet Oral BID  . Sirolimus  1.5 mg Oral q morning - 10a    PRN Medications: methocarbamol **OR** methocarbamol (ROBAXIN)  IV, morphine injection, oxyCODONE, oxyCODONE   Past Medical History  Diagnosis Date  . Type 2 diabetes mellitus   . Essential hypertension   . Myocardial infarction 1985, 1990  . History of DVT of lower extremity  Left leg  . Anemia   . Blood transfusion   . Peripheral vascular disease   . ESRD (end stage renal disease)     Hemodialysis MWF, Dr. [name], [facility]  . History of DVT (deep vein thrombosis) 08/25/2014    Nearly occluded RIJ and L subclavian DVT  . Ischemic cardiomyopathy     Status post cardiac transplant at [facility]  . Vasculopathy of cardiac allograft     Moderate CAD 09/2013 -  Dr. Corky Mull [facility])  . History of pulmonary aspergillosis   . Renal transplant recipient     Failed  . Mobility impaired     Uses walker and wheelchair  . Heart transplant, orthotopic, status     [facility] 2001, LVEF 50-55% 09/2013    Past Surgical History  Procedure Laterality Date  . Nephrectomy transplanted organ  2008  . Av fistula repair Right     Forearm fistula  . Av fistula placement  ~ 01/2010  . Heart transplant  06/1999  . Colonoscopy  03/17/2012    Procedure: COLONOSCOPY;  Surgeon: Lear Ng, MD;  Location: [facility] ENDOSCOPY;  Service: Endoscopy;  Laterality: N/A;  . Coronary artery bypass graft  1990    CABG X3  . Colonoscopy with propofol N/A 12/14/2013    Procedure: COLONOSCOPY WITH PROPOFOL;  Surgeon: Lear Ng, MD;  Location: [facility] ENDOSCOPY;  Service: Endoscopy;  Laterality: N/A;  . Video bronchoscopy Bilateral 04/14/2014    Procedure: VIDEO BRONCHOSCOPY WITH FLUORO;  Surgeon: Collene Gobble, MD;  Location: [place];  Service: Cardiopulmonary;  Laterality: Bilateral;  . Shuntogram Right 05/09/2014    Procedure: FISTULOGRAM;  Surgeon: Conrad [place], MD;  Location: [facility] CATH LAB;  Service: Cardiovascular;  Laterality: Right;  . Coronary angioplasty with stent placement  1985; 1990    prior stents in the 80's and 90's then CABG; s/p heart transplant 2001 s/p BMS mid LAD and proximal RCA 04/21/13 [facility])  . Av fistula placement Right 10/20/2014    Procedure: INSERTION OF ARTERIOVENOUS (AV) GORE-TEX GRAFT THIGH;  Surgeon: Conrad [place], MD;  Location: [place];  Service: Vascular;  Laterality: Right;  . Hematoma evacuation Right 11/21/2014    Procedure: EVACUATION HEMATOMA Right Groin;  Surgeon: Conrad [place], MD;  Location: [place];  Service: Vascular;  Laterality: Right;  . Application of wound vac Right 11/21/2014    Procedure: APPLICATION OF WOUND VAC Right Groin;  Surgeon: Conrad , MD;  Location: [place];  Service: Vascular;  Laterality: Right;  . Peripheral  vascular catheterization N/A 11/25/2014    Procedure: Abdominal Aortogram;  Surgeon: Elam Dutch, MD;  Location: [place] CV LAB;  Service: Cardiovascular;  Laterality: N/A;  . Lower extremity angiogram Right 11/25/2014    Procedure: Lower Extremity Angiogram;  Surgeon: Elam Dutch, MD;  Location: [place] CV LAB;  Service: Cardiovascular;  Laterality: Right;  . Flexible sigmoidoscopy N/A 12/30/2014    Procedure: FLEXIBLE SIGMOIDOSCOPY;  Surgeon: Ronald Lobo, MD;  Location: [facility] ENDOSCOPY;  Service: Endoscopy;  Laterality: N/A;    Family History  Problem Relation Age of Onset  . Heart disease Mother   . Hyperlipidemia Mother   . Hypertension Mother   . Diabetes Mother   . Hyperlipidemia Father   . Hypertension Father   . Heart disease Father   . Diabetes Brother   . Heart disease Brother     Heart Disease before age 74  . Hyperlipidemia Brother   .  Hypertension Brother   . Heart attack Brother     Social History Mr. Sprinkle reports that he quit smoking about 26 years ago. His smoking use included Cigarettes. He has a 20 pack-year smoking history. He has never used smokeless tobacco. Mr. Delorenzo reports that he drinks alcohol.  Review of Systems Complete review of systems negative except as otherwise outlined in the clinical summary and also the following. His problems with chronic anemia as reviewed in the chart. He is on Coumadin with history of recurrent DVTs.  Physical Examination Blood pressure 136/44, pulse 95, temperature 98.7 F (37.1 C), temperature source Oral, resp. rate 15, height 6' (1.829 m), weight 153 lb 14.1 oz (69.8 kg), SpO2 95 %.  Intake/Output Summary (Last 24 hours) at 01/30/15 1748 Last data filed at 01/30/15 1705  Gross per 24 hour  Intake    670 ml  Output    270 ml  Net    400 ml   Gen.: Patient in no distress. HEENT: Conjunctiva and lids normal, oropharynx clear. Neck: Supple, no elevated JVP or carotid bruits, no thyromegaly. Lungs:  Clear to auscultation, nonlabored breathing at rest. Cardiac: Regular rate and rhythm, no S3, 2/6 systolic murmur, no pericardial rub. Abdomen: Soft, nontender, bowel sounds present, no guarding or rebound. Extremities: No pitting edema, distal pulses 2+. Skin: Warm and dry. Musculoskeletal: No kyphosis. Neuropsychiatric: Alert and oriented x3, affect grossly appropriate.   Lab Results  Basic Metabolic Panel:  Recent Labs Lab 01/27/15 1220 01/30/15 0217 01/30/15 1300  NA 135 135 136  K 3.2* 3.8 3.6  CL 96* 97* 98*  CO2 29 29 30   GLUCOSE 81 131* 113*  BUN 27* 29* 31*  CREATININE 6.18* 5.64* 6.24*  CALCIUM 8.5* 8.4* 8.3*  PHOS  --   --  3.0    Liver Function Tests:  Recent Labs Lab 01/30/15 1300  ALBUMIN 2.2*    CBC:  Recent Labs Lab 01/27/15 1220 01/30/15 0217 01/30/15 1144  WBC 2.9* 3.8* 3.0*  NEUTROABS  --  2.1 1.4*  HGB 6.1* 7.4* 6.9*  HCT 19.0* 22.8* 21.2*  MCV 88.8 90.1 88.7  PLT 44* 56* 34*    Imaging Chest x-ray 01/30/2015: FINDINGS: Postoperative changes in the mediastinum. Vascular grafts in the upper chest bilaterally. Calcified and tortuous aorta. Since the previous study, the central line has been removed. Shallow inspiration. Cardiac enlargement without significant vascular congestion or edema. No focal consolidation. No blunting of costophrenic angles.  IMPRESSION: Cardiac enlargement. No evidence of active pulmonary disease.  Impression  1. Preoperative evaluation in a 69 year old male with history of cardiac transplantation in 2001, moderate transplant vasculopathy by cardiac catheterization in May 2015, and LVEF 50-55% at that time. In addition he is status post failed renal transplantation and is currently on hemodialysis regularly per Dr. Lorrene Reid. Further chronic problems include history of DVT within the last 6 months, on Coumadin at home, also chronic anemia requiring packed red cell transfusion. He is being considered for elective  left hip surgery by Dr. Sharol Given for correction of left intertrochanteric hip fracture after mechanical fall. From a cardiac perspective, he has been relatively stable over the last year based on review of the chart and discussion with him. He does not describe any accelerating angina or increasing shortness of breath beyond NYHA class II. He is on beta blocker and statin at baseline. Currently in sinus rhythm by ECG as well.  2. Failed renal transplantation, on hemodialysis, followed by Dr. Lorrene Reid.  3.  History of DVT, on Coumadin at home, currently held in anticipation of surgery.  4. Acute on chronic anemia.  5. Essential hypertension, blood pressure stable.  6. Type 2 diabetes mellitus.   Recommendations  Overall moderate to high perioperative risk for complication anticipated, although not specifically cardiac. Risk is mainly associated with complex comorbidities. He has been relatively stable from a cardiac perspective over the last year, would follow-up echocardiogram to ensure stability in LVEF as well. We will continue to follow with you, no further cardiac testing is anticipated at this time. He will need close monitoring by the internal medicine service as it relates to anemia, thromboembolic management, and general assessment for risk of infection. Nephrology following for hemodialysis.  Satira Sark, M.D., F.A.C.C.

## 2015-01-30 NOTE — ED Provider Notes (Signed)
CSN: [id number]     Arrival date & time 01/30/15  0151 History  This chart was scribed for Ripley Fraise, MD by Helane Gunther, ED Scribe. This patient was seen in room WA19/WA19 and the patient's care was started at 2:00 AM.    Chief Complaint  Patient presents with  . Fall  . Leg Pain   Patient is a 69 y.o. male presenting with fall and leg pain. The history is provided by the patient. No language interpreter was used.  Fall  Leg Pain Associated symptoms: no back pain and no neck pain    HPI Comments: KYION GAUTIER is a 69 y.o. male former smoker with a PMHx of DM, HTN, Rebnal failure, DVT, and heart transplant (2001), who presents to the Emergency Department complaining of a fall that occurred 2 hours ago. He states he was walking with his walker at around 12 AM when he fell, landing on his left leg. He notes exacerbation of the pain with movement from the hip down. He states he is currently on warfarin. Pt notes his next dialysis appointment is today (9/5). He denies weakness or dizziness before the fall. Pt also denies neck pain, back pain, as well as pain in the left ankle, foot, and knee.  Past Medical History  Diagnosis Date  . Diabetes mellitus   . Hypertension   . Myocardial infarction 1985; 1990  . Angina   . Dysrhythmia   . DVT (deep venous thrombosis) ~ 12/2010    LLE  . Anemia   . Blood transfusion 06/1999    post heart transplant, last transfusion 2 weeks ago 01-17-15  . Peripheral vascular disease   . Renal failure     Hemodialysis MWF last 2 years, sse dr [name] nephrology, goes to [facility]  . DVT (deep venous thrombosis) 08/25/2014    Nearly occluded R IJ, and L subclavian DVT  . CHF (congestive heart failure) 2001    beffore transplant; had AICD that was explanted 07/19/99 following successful heart transplant  . Coronary artery disease     transplant cardiologist Dr. Corky Mull [facility])  . Pneumonia     resolved 1'16  . Hemodialysis  patient     M_[id number]- [facility] location  . Organ transplant     2001-Heart Transplant([facility]), s/p Kidney transplant(failed)  . Mobility impaired     limited standing-uses wheelchair.   Past Surgical History  Procedure Laterality Date  . Nephrectomy transplanted organ  2008  . Av fistula repair      rt. a=fore arm fistula  . Av fistula placement  ~ 01/2010    "this was my 2nd fistula placement"  . Heart transplant  06/1999  . Colonoscopy  03/17/2012    Procedure: COLONOSCOPY;  Surgeon: Lear Ng, MD;  Location: [facility];  Service: Endoscopy;  Laterality: N/A;  . Coronary artery bypass graft  1990    CABG X3  . Colonoscopy with propofol N/A 12/14/2013    Procedure: COLONOSCOPY WITH PROPOFOL;  Surgeon: Lear Ng, MD;  Location: [facility];  Service: Endoscopy;  Laterality: N/A;  . Video bronchoscopy Bilateral 04/14/2014    Procedure: VIDEO BRONCHOSCOPY WITH FLUORO;  Surgeon: Collene Gobble, MD;  Location: [place];  Service: Cardiopulmonary;  Laterality: Bilateral;  . Shuntogram Right 05/09/2014    Procedure: FISTULOGRAM;  Surgeon: Conrad Arnold, MD;  Location: [facility] CATH LAB;  Service: Cardiovascular;  Laterality: Right;  . Coronary angioplasty with stent placement  1985; 1990  prior stents in the 80's and 90's then CABG; s/p heart transplant 2001 s/p BMS mid LAD and proximal RCA 04/21/13 [facility])  . Av fistula placement Right 10/20/2014    Procedure: INSERTION OF ARTERIOVENOUS (AV) GORE-TEX GRAFT THIGH;  Surgeon: Conrad [place], MD;  Location: [place];  Service: Vascular;  Laterality: Right;  . Hematoma evacuation Right 11/21/2014    Procedure: EVACUATION HEMATOMA Right Groin;  Surgeon: Conrad [place], MD;  Location: [place];  Service: Vascular;  Laterality: Right;  . Application of wound vac Right 11/21/2014    Procedure: APPLICATION OF WOUND VAC Right Groin;  Surgeon: Conrad Milton-Free[place], MD;  Location: [place];  Service: Vascular;  Laterality: Right;  . Peripheral vascular  catheterization N/A 11/25/2014    Procedure: Abdominal Aortogram;  Surgeon: Elam Dutch, MD;  Location: [place] CV LAB;  Service: Cardiovascular;  Laterality: N/A;  . Lower extremity angiogram Right 11/25/2014    Procedure: Lower Extremity Angiogram;  Surgeon: Elam Dutch, MD;  Location: [place] CV LAB;  Service: Cardiovascular;  Laterality: Right;  . Flexible sigmoidoscopy N/A 12/30/2014    Procedure: FLEXIBLE SIGMOIDOSCOPY;  Surgeon: Ronald Lobo, MD;  Location: [facility] ENDOSCOPY;  Service: Endoscopy;  Laterality: N/A;   Family History  Problem Relation Age of Onset  . Heart disease Mother   . Hyperlipidemia Mother   . Hypertension Mother   . Diabetes Mother   . Hyperlipidemia Father   . Hypertension Father   . Heart disease Father   . Diabetes Brother   . Heart disease Brother     Heart Disease before age 13  . Hyperlipidemia Brother   . Hypertension Brother   . Heart attack Brother    Social History  Substance Use Topics  . Smoking status: Former Smoker -- 1.00 packs/day for 20 years    Types: Cigarettes    Quit date: 05/27/1988  . Smokeless tobacco: Never Used  . Alcohol Use: 0.0 oz/week    0 Standard drinks or equivalent per week     Comment: "Holidays"    Review of Systems  Musculoskeletal: Positive for myalgias and arthralgias. Negative for back pain and neck pain.  Neurological: Negative for dizziness and weakness.  All other systems reviewed and are negative.   Allergies  Lisinopril; Niacin and related; Norvasc; and Penicillins  Home Medications   Prior to Admission medications   Medication Sig Start Date End Date Taking? Authorizing Provider  atorvastatin (LIPITOR) 40 MG tablet Take 40 mg by mouth every evening.     Historical Provider, MD  azaTHIOprine (IMURAN) 50 MG tablet Take 75 mg by mouth every morning.     Historical Provider, MD  b complex-vitamin c-folic acid (NEPHRO-VITE) 0.8 MG TABS tablet Take 1 tablet by mouth daily.     Historical  Provider, MD  calcium acetate (PHOSLO) 667 MG capsule Take 1 capsule (667 mg total) by mouth 2 (two) times daily with a meal. Patient not taking: Reported on 01/12/2015 12/14/14   Ivan Anchors Love, PA-C  carvedilol (COREG) 6.25 MG tablet Take 1 tablet (6.25 mg total) by mouth at bedtime. 04/15/14   Geradine Girt, DO  colesevelam (WELCHOL) 625 MG tablet Take 3,750 mg by mouth See admin instructions. Takes 6 tabs daily with meals per patient    Historical Provider, MD  Darbepoetin Alfa (ARANESP) 200 MCG/0.4ML SOSY injection Inject 0.4 mLs (200 mcg total) into the vein every Wednesday with hemodialysis. 12/31/14   Allie Bossier, MD  doxercalciferol (HECTOROL) 4  MCG/2ML injection Inject 1.5 mLs (3 mcg total) into the vein every Monday, Wednesday, and Friday with hemodialysis. 12/31/14   Allie Bossier, MD  HUMALOG KWIKPEN 100 UNIT/ML SOPN Inject 4-10 Units into the skin 2 (two) times daily. Per sliding scale 11/17/12   Historical Provider, MD  isosorbide dinitrate (ISOCHRON) 40 MG CR tablet Take 40 mg by mouth every 8 (eight) hours. Take every 8 hours per patient 10/03/14   Historical Provider, MD  oxyCODONE (OXY IR/ROXICODONE) 5 MG immediate release tablet Take 1 tablet (5 mg total) by mouth every 6 (six) hours as needed for moderate pain. 12/14/14   Bary Leriche, PA-C  pantoprazole (PROTONIX) 40 MG tablet Take 1 tablet (40 mg total) by mouth daily. Patient not taking: Reported on 01/12/2015 12/02/14   Silver Huguenin Elgergawy, MD  pantoprazole (PROTONIX) 40 MG tablet Take 1 tablet (40 mg total) by mouth daily. Patient not taking: Reported on 01/12/2015 12/31/14   Allie Bossier, MD  polyethylene glycol [facility] Floria Raveling) packet Take 17 g by mouth daily. 12/31/14   Allie Bossier, MD  predniSONE (DELTASONE) 5 MG tablet Take 1 tablet (5 mg total) by mouth daily with breakfast. 08/29/14   Orson Eva, MD  senna-docusate (SENOKOT-S) 8.6-50 MG per tablet Take 3 tablets by mouth 2 (two) times daily. 12/14/14   Bary Leriche, PA-C   Sirolimus 0.5 MG TABS Take 3 tablets by mouth every morning.  10/10/14   Historical Provider, MD  Vancomycin (VANCOCIN) 750 MG/150ML SOLN Inject 150 mLs (750 mg total) into the vein every Monday, Wednesday, and Friday with hemodialysis. 12/31/14   Allie Bossier, MD  warfarin (COUMADIN) 5 MG tablet Please consult: Inpatient pharmacy to continue dosing warfarin and monitor Patient taking differently: Take 5 mg by mouth at bedtime. Please consult: Inpatient pharmacy to continue dosing warfarin and monitor 12/02/14   Silver Huguenin Elgergawy, MD   BP 138/53 mmHg  Pulse 90  Temp(Src)   Resp 13  SpO2 94% Physical Exam CONSTITUTIONAL: Well developed/well nourished HEAD: Normocephalic/atraumatic EYES: EOMI/PERRL ENMT: Mucous membranes moist NECK: supple no meningeal signs SPINE/BACK: no c-spine TTP CV: S1/S2 noted, no murmurs/rubs/gallops noted LUNGS: Lungs are clear to auscultation bilaterally, no apparent distress ABDOMEN: soft, nontender, no rebound or guarding, bowel sounds noted throughout abdomen GU:no cva tenderness NEURO: Pt is awake/alert/appropriate, moves all extremitiesx4.  No facial droop.  Denies sensory loss to left foot.  He is able to move his left foot/toes EXTREMITIES: pulses normal/equal, TTP of L hip and L tib/fib region. Pt keeps L hip in a flexed position. Pt also has TTP to L little finger SKIN: warm, color normal PSYCH: no abnormalities of mood noted, alert and oriented to situation  ED Course  Procedures  DIAGNOSTIC STUDIES: Oxygen Saturation is 100% on RA, normal by my interpretation.    COORDINATION OF CARE: 2:06 AM - Discussed plans to order diagnostic imaging. Pt advised of plan for treatment and pt agrees. 4:53 AM Pt with Left intertroch fracture His pain is improved No other acute injuries D/w patel, he will arrange transfer to [place] as he is on dialysis Call placed to ortho on call 5:20 AM  D/w dr Sharol Given with ortho He is aware of patient and will see  patient Pt stable at this time BP 139/49 mmHg  Pulse 98  Temp(Src)   Resp 11  SpO2 97%  Labs Review Labs Reviewed  BASIC METABOLIC PANEL - Abnormal; Notable for the following:    Chloride  97 (*)    Glucose, Bld 131 (*)    BUN 29 (*)    Creatinine, Ser 5.64 (*)    Calcium 8.4 (*)    GFR calc non Af Amer 9 (*)    GFR calc Af Amer 11 (*)    All other components within normal limits  CBC WITH DIFFERENTIAL/PLATELET - Abnormal; Notable for the following:    WBC 3.8 (*)    RBC 2.53 (*)    Hemoglobin 7.4 (*)    HCT 22.8 (*)    RDW 15.6 (*)    Platelets 56 (*)    All other components within normal limits  PROTIME-INR - Abnormal; Notable for the following:    Prothrombin Time 23.5 (*)    INR 2.11 (*)    All other components within normal limits  TYPE AND SCREEN    Imaging Review Dg Chest 1 View  01/30/2015   CLINICAL DATA:  Patient fell this morning landing on the left side. Left hip pain and deformity.  EXAM: CHEST  1 VIEW  COMPARISON:  12/23/2014  FINDINGS: Postoperative changes in the mediastinum. Vascular grafts in the upper chest bilaterally. Calcified and tortuous aorta. Since the previous study, the central line has been removed. Shallow inspiration. Cardiac enlargement without significant vascular congestion or edema. No focal consolidation. No blunting of costophrenic angles.  IMPRESSION: Cardiac enlargement.  No evidence of active pulmonary disease.   Electronically Signed   By: Lucienne Capers M.D.   On: 01/30/2015 04:14   Dg Tibia/fibula Left  01/30/2015   CLINICAL DATA:  Patient fell this morning, landing on the left side. Pain.  EXAM: LEFT TIBIA AND FIBULA - 2 VIEW  COMPARISON:  None.  FINDINGS: Diffuse bone demineralization. No evidence of acute fracture or dislocation. No focal bone lesion or bone destruction. Extensive vascular calcifications. Surgical clips in the soft tissues. Old periosteal reaction in the fibular shaft.  IMPRESSION: Diffuse bone demineralization.  No  acute bony abnormalities.   Electronically Signed   By: Lucienne Capers M.D.   On: 01/30/2015 04:48   Dg Hand Complete Left  01/30/2015   CLINICAL DATA:  Patient fell this morning, landing on the left. Pain fifth metacarpal area.  EXAM: LEFT HAND - COMPLETE 3+ VIEW  COMPARISON:  None.  FINDINGS: Diffuse bone demineralization. Diffuse degenerative changes in the interphalangeal joints, first carpometacarpal and metacarpal phalangeal joints, and in the intercarpal joints. No acute fracture or dislocation demonstrated. Soft tissue swelling about the medial aspect of the left hand. Vascular calcifications.  IMPRESSION: Diffuse bone demineralization and degenerative changes. No acute bony abnormalities.   Electronically Signed   By: Lucienne Capers M.D.   On: 01/30/2015 04:10   Dg Hip Unilat With Pelvis 2-3 Views Left  01/30/2015   CLINICAL DATA:  Patient fell onto the left side this morning. Left hip pain and deformity.  EXAM: DG HIP (WITH OR WITHOUT PELVIS) 2-3V LEFT  COMPARISON:  None.  FINDINGS: Acute comminuted inter trochanteric fracture of the left hip with impaction and valgus orientation of the fracture fragments. Mildly displaced lesser trochanteric fragment. Prominent degenerative changes in both hips. No evidence of dislocation of the hip. The visualized pelvis appears intact. SI joints and symphysis pubis are not displaced. Extensive vascular calcifications.  IMPRESSION: Acute comminuted inter trochanteric fracture of the left proximal femur with impaction and valgus orientation of the fracture fragments.   Electronically Signed   By: Lucienne Capers M.D.   On: 01/30/2015 04:15   I  have personally reviewed and evaluated these images and lab results as part of my medical decision-making.   EKG Interpretation   Date/Time:  Monday January 30 2015 03:01:42 EDT Ventricular Rate:  87 PR Interval:  177 QRS Duration: 168 QT Interval:  447 QTC Calculation: 538 R Axis:   -73 Text Interpretation:   Sinus rhythm RBBB and LAFB LVH with secondary  repolarization abnormality No significant change since last tracing  Abnormal ekg Confirmed by Christy Gentles  MD, Elenore Rota ([id number]) on 01/30/2015 3:21:58  AM      MDM   Final diagnoses:  Chronic renal failure, unspecified stage  Fracture, intertrochanteric, left femur, closed, initial encounter    Nursing notes including past medical history and social history reviewed and considered in documentation xrays/imaging reviewed by myself and considered during evaluation Labs/vital reviewed myself and considered during evaluation   I personally performed the services described in this documentation, which was scribed in my presence. The recorded information has been reviewed and is accurate.      Ripley Fraise, MD 01/30/15 [phone]

## 2015-01-30 NOTE — Progress Notes (Signed)
ANTICOAGULATION CONSULT NOTE - Follow Up Consult  Pharmacy Consult for Heparin when INR < 2 Indication: hx DVT  Allergies  Allergen Reactions  . Lisinopril Swelling    Lips and tongue swell  . Niacin And Related Other (See Comments)    unknown  . Norvasc [Amlodipine Besylate] Rash    Flushing  . Penicillins Rash    Patient Measurements: Height: 6' (182.9 cm) Weight: 156 lb 8.4 oz (71 kg) IBW/kg (Calculated) : 77.6 Heparin Dosing Weight: 71 kg  Vital Signs: Temp: 97.7 F (36.5 C) (09/05 1539) Temp Source: Oral (09/05 1539) BP: 134/60 mmHg (09/05 1539) Pulse Rate: 94 (09/05 1539)  Labs:  Recent Labs  01/30/15 0217 01/30/15 1144 01/30/15 1300  HGB 7.4* 6.9*  --   HCT 22.8* 21.2*  --   PLT 56* 34*  --   LABPROT 23.5* 24.3*  --   INR 2.11* 2.21*  --   CREATININE 5.64*  --  6.24*    Estimated Creatinine Clearance: 11.1 mL/min (by C-G formula based on Cr of 6.24).  Assessment:   Home Coumadin on hold for hip surgery on 01/31/15 at ~4pm.   Home Coumadin dose: 4 mg daily. Last dose 01/29/15.   INR 2.11 this am, repeat at 12noon = 2.21.   Vitamin K 5 mg IV ordered early this am while still in [place] ED, but transferred to [facility] before dose given.  Just finishing with hemodialysis.  Vitamin K to be given on the floor.   Hgb 6.9.  2 units PRBCs given in HD.   Platelet count only 44>54>34K.   Seems to run in 40s-50s. No bleeding noted.    Goal of Therapy:  Heparin level 0.3-0.7 units/ml Monitor platelets by anticoagulation protocol: Yes   Plan:   Vitamin K 5 mg IV to be given.  PT/INR ~ 6 hrs after Vitamin K dose.  Begin heparin when INR < 2.  Ortho to indicate when to stop IV heparin pre-op.  Arty Baumgartner, [place] Pager: [phone] 01/30/2015,4:25 PM

## 2015-01-30 NOTE — H&P (Addendum)
[facility] History and Physical  Patient: Brandon Sharp  MRN: [id number]  DOB: [birth year]  DOS: the patient was seen and examined on 01/30/2015 PCP: [name], MD  Referring physician: Dr. Christy Gentles Chief Complaint: Fall  HPI: [name] is a 69 y.o. 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 The patient is presenting with a mechanical fall. The patient was trying to walk around with his walker and stumbled on something and fell on the ground. He fell on the left leg. Denies having any head injury or neck injury or passing out episode. Denies any loss of control of bowel or bladder. He was having significant pain and was not able to move his leg due to the pain and therefore decided to come to the ER. Patient was initially taken to [facility]  and later on due to weeks time decided to come to [facility] long ER.  Pt denies any fever, chills, headache,choking episodes, cough, rash anywhere,  chest pain, palpitation, shortness of breath,nausea, vomiting, diarrhea, constipation, abdominal pain, acid reflux, active bleeding, dizziness, pedal edema, difficulty swallowing, focal neurological deficit.  The patient is coming from home.  At his baseline ambulates with a walker And is independent for most of his ADL manages his medication on his own.  Review of Systems: as mentioned in the history of present illness.  A comprehensive review of the other systems is negative.  Past Medical History  Diagnosis Date  . Diabetes mellitus   . Hypertension   . Myocardial infarction 1985; 1990  . Angina   . Dysrhythmia   . DVT (deep venous thrombosis) ~ 12/2010    LLE    . Anemia   . Blood transfusion 06/1999    post heart transplant, last transfusion 2 weeks ago 01-17-15  . Peripheral vascular disease   . Renal failure     Hemodialysis MWF last 2 years, sse dr [name] nephrology, goes to [place] [facility]  . DVT (deep venous thrombosis) 08/25/2014    Nearly occluded R IJ, and L subclavian DVT  . CHF (congestive heart failure) 2001    beffore transplant; had AICD that was explanted 07/19/99 following successful heart transplant  . Coronary artery disease     transplant cardiologist Dr. Corky Mull [facility])  . Pneumonia     resolved 1'16  . Hemodialysis patient     M_W_F- [facility] location  . Organ transplant     2001-Heart Transplant([facility]), s/p Kidney transplant(failed)  . Mobility impaired     limited standing-uses wheelchair.   Past Surgical History  Procedure Laterality Date  . Nephrectomy transplanted organ  2008  . Av fistula repair      rt. a=fore arm fistula  . Av fistula placement  ~ 01/2010    "this was my 2nd fistula placement"  . Heart transplant  06/1999  . Colonoscopy  03/17/2012    Procedure: COLONOSCOPY;  Surgeon: Lear Ng, MD;  Location: [facility] ENDOSCOPY;  Service: Endoscopy;  Laterality: N/A;  . Coronary artery bypass graft  1990    CABG X3  . Colonoscopy with propofol N/A 12/14/2013    Procedure: COLONOSCOPY WITH PROPOFOL;  Surgeon: Lear Ng, MD;  Location: [facility] ENDOSCOPY;  Service: Endoscopy;  Laterality: N/A;  .  Video bronchoscopy Bilateral 04/14/2014    Procedure: VIDEO BRONCHOSCOPY WITH FLUORO;  Surgeon: Collene Gobble, MD;  Location: [place];  Service: Cardiopulmonary;  Laterality: Bilateral;  . Shuntogram Right 05/09/2014    Procedure: FISTULOGRAM;  Surgeon: Conrad Harrison, MD;  Location: [facility] CATH LAB;  Service: Cardiovascular;  Laterality: Right;  . Coronary angioplasty with stent placement  1985; 1990    prior stents in the 80's and 90's then CABG; s/p heart transplant 2001 s/p BMS mid  LAD and proximal RCA 04/21/13 [facility])  . Av fistula placement Right 10/20/2014    Procedure: INSERTION OF ARTERIOVENOUS (AV) GORE-TEX GRAFT THIGH;  Surgeon: Conrad Koloa, MD;  Location: [place];  Service: Vascular;  Laterality: Right;  . Hematoma evacuation Right 11/21/2014    Procedure: EVACUATION HEMATOMA Right Groin;  Surgeon: Conrad Cairnbrook, MD;  Location: [place];  Service: Vascular;  Laterality: Right;  . Application of wound vac Right 11/21/2014    Procedure: APPLICATION OF WOUND VAC Right Groin;  Surgeon: Conrad , MD;  Location: [place];  Service: Vascular;  Laterality: Right;  . Peripheral vascular catheterization N/A 11/25/2014    Procedure: Abdominal Aortogram;  Surgeon: Elam Dutch, MD;  Location: [place] CV LAB;  Service: Cardiovascular;  Laterality: N/A;  . Lower extremity angiogram Right 11/25/2014    Procedure: Lower Extremity Angiogram;  Surgeon: Elam Dutch, MD;  Location: [place] CV LAB;  Service: Cardiovascular;  Laterality: Right;  . Flexible sigmoidoscopy N/A 12/30/2014    Procedure: FLEXIBLE SIGMOIDOSCOPY;  Surgeon: Ronald Lobo, MD;  Location: [facility] ENDOSCOPY;  Service: Endoscopy;  Laterality: N/A;   Social History:  reports that he quit smoking about 26 years ago. His smoking use included Cigarettes. He has a 20 pack-year smoking history. He has never used smokeless tobacco. He reports that he drinks alcohol. He reports that he does not use illicit drugs.  Allergies  Allergen Reactions  . Lisinopril Swelling    Lips and tongue swell  . Niacin And Related Other (See Comments)    unknown  . Norvasc [Amlodipine Besylate] Rash    Flushing  . Penicillins Rash    Family History  Problem Relation Age of Onset  . Heart disease Mother   . Hyperlipidemia Mother   . Hypertension Mother   . Diabetes Mother   . Hyperlipidemia Father   . Hypertension Father   . Heart disease Father   . Diabetes Brother   . Heart disease Brother     Heart Disease before age 44  .  Hyperlipidemia Brother   . Hypertension Brother   . Heart attack Brother     Prior to Admission medications   Medication Sig Start Date End Date Taking? Authorizing Provider  atorvastatin (LIPITOR) 40 MG tablet Take 40 mg by mouth every evening.    Yes Historical Provider, MD  azaTHIOprine (IMURAN) 50 MG tablet Take 75 mg by mouth every morning.    Yes Historical Provider, MD  b complex-vitamin c-folic acid (NEPHRO-VITE) 0.8 MG TABS tablet Take 1 tablet by mouth daily.    Yes Historical Provider, MD  calcium acetate (PHOSLO) 667 MG capsule Take 1 capsule (667 mg total) by mouth 2 (two) times daily with a meal. 12/14/14  Yes Ivan Anchors Love, PA-C  carvedilol (COREG) 6.25 MG tablet Take 1 tablet (6.25 mg total) by mouth at bedtime. 04/15/14  Yes Geradine Girt, DO  colesevelam (WELCHOL) 625 MG tablet Take 3,750 mg by mouth See admin instructions. Takes  6 tabs daily with meals per patient   Yes Historical Provider, MD  Darbepoetin Alfa (ARANESP) 200 MCG/0.4ML SOSY injection Inject 0.4 mLs (200 mcg total) into the vein every Wednesday with hemodialysis. 12/31/14  Yes Allie Bossier, MD  doxercalciferol (HECTOROL) 4 MCG/2ML injection Inject 1.5 mLs (3 mcg total) into the vein every Monday, Wednesday, and Friday with hemodialysis. 12/31/14  Yes Allie Bossier, MD  HUMALOG KWIKPEN 100 UNIT/ML SOPN Inject 4-10 Units into the skin 2 (two) times daily. Per sliding scale 11/17/12  Yes Historical Provider, MD  isosorbide dinitrate (ISOCHRON) 40 MG CR tablet Take 40 mg by mouth every 8 (eight) hours. Take every 8 hours per patient 10/03/14  Yes Historical Provider, MD  oxyCODONE (OXY IR/ROXICODONE) 5 MG immediate release tablet Take 1 tablet (5 mg total) by mouth every 6 (six) hours as needed for moderate pain. 12/14/14  Yes Ivan Anchors Love, PA-C  pantoprazole (PROTONIX) 40 MG tablet Take 1 tablet (40 mg total) by mouth daily. 12/02/14  Yes Silver Huguenin Elgergawy, MD  polyethylene glycol (MIRALAX / GLYCOLAX) packet Take 17 g by  mouth daily. 12/31/14  Yes Allie Bossier, MD  predniSONE (DELTASONE) 5 MG tablet Take 1 tablet (5 mg total) by mouth daily with breakfast. 08/29/14  Yes Orson Eva, MD  senna-docusate (SENOKOT-S) 8.6-50 MG per tablet Take 3 tablets by mouth 2 (two) times daily. 12/14/14  Yes Ivan Anchors Love, PA-C  Sirolimus 0.5 MG TABS Take 3 tablets by mouth every morning.  10/10/14  Yes Historical Provider, MD  Vancomycin (VANCOCIN) 750 MG/150ML SOLN Inject 150 mLs (750 mg total) into the vein every Monday, Wednesday, and Friday with hemodialysis. 12/31/14  Yes Allie Bossier, MD  warfarin (COUMADIN) 5 MG tablet Please consult: Inpatient pharmacy to continue dosing warfarin and monitor Patient taking differently: Take 5 mg by mouth at bedtime. Please consult: Inpatient pharmacy to continue dosing warfarin and monitor 12/02/14  Yes Albertine Patricia, MD    Physical Exam: Filed Vitals:   01/30/15 0314 01/30/15 0400 01/30/15 0406 01/30/15 0500  BP: 138/53  146/51 139/49  Pulse: 90 97 98   Resp: 13 12 0 11  SpO2: 94% 89% 92% 97%    General: Alert, Awake and Oriented to Time, Place and Person. Appear in mild distress Eyes: PERRL ENT: Oral Mucosa clear moist. Neck: no JVD Cardiovascular: S1 and S2 Present, aortic systolic Murmur, Peripheral Pulses Present Respiratory: Bilateral Air entry equal and Decreased,  Clear to Auscultation, no Crackles, no wheezes Abdomen: Bowel Sound present, Soft and no tenderness Skin: no Rash Extremities: Bilateral Pedal edema, no calf tenderness Neurologic: Grossly no focal neuro deficit.  Labs on Admission:  CBC:  Recent Labs Lab 01/27/15 1220 01/30/15 0217  WBC 2.9* 3.8*  NEUTROABS  --  2.1  HGB 6.1* 7.4*  HCT 19.0* 22.8*  MCV 88.8 90.1  PLT 44* 56*    CMP     Component Value Date/Time   NA 135 01/30/2015 0217   K 3.8 01/30/2015 0217   CL 97* 01/30/2015 0217   CO2 29 01/30/2015 0217   GLUCOSE 131* 01/30/2015 0217   BUN 29* 01/30/2015 0217   CREATININE 5.64*  01/30/2015 0217   CALCIUM 8.4* 01/30/2015 0217   PROT 5.6* 12/22/2014 0432   ALBUMIN 2.4* 12/31/2014 0504   AST 28 12/22/2014 0432   ALT 12* 12/22/2014 0432   ALKPHOS 66 12/22/2014 0432   BILITOT 1.1 12/22/2014 0432   GFRNONAA 9* 01/30/2015 0217   GFRAA  11* 01/30/2015 0217    No results for input(s): LIPASE, AMYLASE in the last 168 hours.  No results for input(s): CKTOTAL, CKMB, CKMBINDEX, TROPONINI in the last 168 hours. BNP (last 3 results) No results for input(s): BNP in the last 8760 hours.  ProBNP (last 3 results)  Recent Labs  03/27/14 1747 04/12/14 0105  PROBNP 37853.0* 45045.0*     Radiological Exams on Admission: Dg Chest 1 View  01/30/2015   CLINICAL DATA:  Patient fell this morning landing on the left side. Left hip pain and deformity.  EXAM: CHEST  1 VIEW  COMPARISON:  12/23/2014  FINDINGS: Postoperative changes in the mediastinum. Vascular grafts in the upper chest bilaterally. Calcified and tortuous aorta. Since the previous study, the central line has been removed. Shallow inspiration. Cardiac enlargement without significant vascular congestion or edema. No focal consolidation. No blunting of costophrenic angles.  IMPRESSION: Cardiac enlargement.  No evidence of active pulmonary disease.   Electronically Signed   By: Lucienne Capers M.D.   On: 01/30/2015 04:14   Dg Tibia/fibula Left  01/30/2015   CLINICAL DATA:  Patient fell this morning, landing on the left side. Pain.  EXAM: LEFT TIBIA AND FIBULA - 2 VIEW  COMPARISON:  None.  FINDINGS: Diffuse bone demineralization. No evidence of acute fracture or dislocation. No focal bone lesion or bone destruction. Extensive vascular calcifications. Surgical clips in the soft tissues. Old periosteal reaction in the fibular shaft.  IMPRESSION: Diffuse bone demineralization.  No acute bony abnormalities.   Electronically Signed   By: Lucienne Capers M.D.   On: 01/30/2015 04:48   Dg Hand Complete Left  01/30/2015   CLINICAL DATA:   Patient fell this morning, landing on the left. Pain fifth metacarpal area.  EXAM: LEFT HAND - COMPLETE 3+ VIEW  COMPARISON:  None.  FINDINGS: Diffuse bone demineralization. Diffuse degenerative changes in the interphalangeal joints, first carpometacarpal and metacarpal phalangeal joints, and in the intercarpal joints. No acute fracture or dislocation demonstrated. Soft tissue swelling about the medial aspect of the left hand. Vascular calcifications.  IMPRESSION: Diffuse bone demineralization and degenerative changes. No acute bony abnormalities.   Electronically Signed   By: Lucienne Capers M.D.   On: 01/30/2015 04:10   Dg Hip Unilat With Pelvis 2-3 Views Left  01/30/2015   CLINICAL DATA:  Patient fell onto the left side this morning. Left hip pain and deformity.  EXAM: DG HIP (WITH OR WITHOUT PELVIS) 2-3V LEFT  COMPARISON:  None.  FINDINGS: Acute comminuted inter trochanteric fracture of the left hip with impaction and valgus orientation of the fracture fragments. Mildly displaced lesser trochanteric fragment. Prominent degenerative changes in both hips. No evidence of dislocation of the hip. The visualized pelvis appears intact. SI joints and symphysis pubis are not displaced. Extensive vascular calcifications.  IMPRESSION: Acute comminuted inter trochanteric fracture of the left proximal femur with impaction and valgus orientation of the fracture fragments.   Electronically Signed   By: Lucienne Capers M.D.   On: 01/30/2015 04:15   EKG: Independently reviewed. normal sinus rhythm, RBBB, LAFB.  Assessment/Plan Principal Problem:   Closed left hip fracture Active Problems:   Heart transplanted   S/P CABG (coronary artery bypass graft)   DM (diabetes mellitus), type 2 with renal complications   ESRD on hemodialysis   Immunosuppressed status   Kidney transplant failure   Protein-calorie malnutrition, severe   Thrombocytopenia   Anemia due to chronic kidney disease   Essential  hypertension   1. Closed  left hip fracture The patient is presenting with a fall. Patient is found to have left hip intertrochanteric fracture with impaction. Dr. Sharol Given from orthopedic surgery will be consulting on the patient. The patient will be admitted to [facility]. Pain management with morphine and oxycodone, additional management per hip fracture protocol.  2.preoperative evaluation.  A) Cardiac risk: Based on RCRI  >History of ischemic heart disease with cardiac transplant history >Diabetes mellitus requiring treatment with insulin >Preoperative serum creatinine >2.0 mg/dL  With this the patient is a high risk for adverse Cardiac outcome from surgery. Recommend further work up with echocardiogram. Depending on the finding of the echocardiogram patient may need cardiology clearance.  B) Pulmonary risk: Recommend incentive spirometry. Good pulmunary toilet.  C) bleeding risk With chronic anemia as well as thrombocytopenia the patient is at high risk for significant bleeding, on top of that the patient requires therapeutic anticoagulation for his DVT. Patient is agreeable with the risk. We will give him vitamin K 1. Recheck INR at 12. Heparin per pharmacy protocol without bolus after the INR is less than 2. Patient may require blood transfusion to maintain H&H preoperatively also may require platelet transfusion. We'll discuss with surgeon in the morning.  3. ESRD on hemodialysis. Continue management of his ephedra regimen. The patient is transferred to [facility]. Dialysis line is called.  4. Diabetes mellitus. Continuing sliding scale insulin.  5. Chronic anemia and chronic thrombocytopenia. Significant bleeding risk as mentioned above. Patient recently received 2 units of PRBC. Receiving supplements EPO  with hemodialysis. Continue iron .  6.constipation. Improved With Senokot regimen which the patient is continuing.  7.recent groin  infection. Patient completed a course of vancomycin. We'll continue to closely monitor.  8. Cardiac and renal transplant Pt on immunosuppressive medication which I will continue He will also need stress dose steroids preoperatively.  Advance goals of care discussion: DNR/DNI as per my discussion with patient Consult: Orthopedic  DVT Prophylaxis: on therapeutic anticoagulation. Nutrition: Nothing by mouth  Family Communication: family was present at bedside, opportunity was given to ask question and all questions were answered satisfactorily at the time of interview. Disposition: Admitted as inpatient, Telemetry unit.  Author: Berle Mull, MD [facility] Pager: [phone] 01/30/2015  If 7PM-7AM, please contact night-coverage [email or web address] Password [email or web address]

## 2015-01-30 NOTE — Consult Note (Signed)
Reason for Consult: Left intertrochanteric hip fracture Referring Physician: ER physician  Brandon Sharp is an 69 y.o. male.  HPI: Patient is a 69 year old gentleman who was trying to get into bed last night when he fell onto his left hip. Patient had acute onset of pain.   Patient is status post heart transplant subsequent developed renal failure and diabetes status post kidney transplant as well which has failed. Currently has received multiple transfusions with no identifiable origin of the blood loss.  Past Medical History  Diagnosis Date  . Diabetes mellitus   . Hypertension   . Myocardial infarction 1985; 1990  . Angina   . Dysrhythmia   . DVT (deep venous thrombosis) ~ 12/2010    LLE  . Anemia   . Blood transfusion 06/1999    post heart transplant, last transfusion 2 weeks ago 01-17-15  . Peripheral vascular disease   . Renal failure     Hemodialysis MWF last 2 years, sse dr [name] nephrology, goes to [place] [facility]  . DVT (deep venous thrombosis) 08/25/2014    Nearly occluded R IJ, and L subclavian DVT  . CHF (congestive heart failure) 2001    beffore transplant; had AICD that was explanted 07/19/99 following successful heart transplant  . Coronary artery disease     transplant cardiologist Dr. Corky Mull [facility])  . Pneumonia     resolved 1'16  . Hemodialysis patient     M_W_F- [facility] location  . Organ transplant     2001-Heart Transplant([facility]), s/p Kidney transplant(failed)  . Mobility impaired     limited standing-uses wheelchair.    Past Surgical History  Procedure Laterality Date  . Nephrectomy transplanted organ  2008  . Av fistula repair      rt. a=fore arm fistula  . Av fistula placement  ~ 01/2010    "this was my 2nd fistula placement"  . Heart transplant  06/1999  . Colonoscopy  03/17/2012    Procedure: COLONOSCOPY;  Surgeon: Lear Ng, MD;  [facility]: [facility];  Service: Endoscopy;  Laterality: N/A;  . Coronary  artery bypass graft  1990    CABG X3  . Colonoscopy with propofol N/A 12/14/2013    Procedure: COLONOSCOPY WITH PROPOFOL;  Surgeon: Lear Ng, MD;  [facility];  Service: Endoscopy;  Laterality: N/A;  . Video bronchoscopy Bilateral 04/14/2014    Procedure: VIDEO BRONCHOSCOPY WITH FLUORO;  Surgeon: Collene Gobble, MD;  Location: [place];  Service: Cardiopulmonary;  Laterality: Bilateral;  . Shuntogram Right 05/09/2014    Procedure: FISTULOGRAM;  Surgeon: Conrad Corinth, MD;  Location: [facility] CATH LAB;  Service: Cardiovascular;  Laterality: Right;  . Coronary angioplasty with stent placement  1985; 1990    prior stents in the 80's and 90's then CABG; s/p heart transplant 2001 s/p BMS mid LAD and proximal RCA 04/21/13 [facility])  . Av fistula placement Right 10/20/2014    Procedure: INSERTION OF ARTERIOVENOUS (AV) GORE-TEX GRAFT THIGH;  Surgeon: Conrad [place], MD;  Location: [place];  Service: Vascular;  Laterality: Right;  . Hematoma evacuation Right 11/21/2014    Procedure: EVACUATION HEMATOMA Right Groin;  Surgeon: Conrad Edgerton, MD;  Location: [place];  Service: Vascular;  Laterality: Right;  . Application of wound vac Right 11/21/2014    Procedure: APPLICATION OF WOUND VAC Right Groin;  Surgeon: Conrad [place], MD;  Location: [place];  Service: Vascular;  Laterality: Right;  . Peripheral vascular catheterization N/A 11/25/2014  Procedure: Abdominal Aortogram;  Surgeon: Elam Dutch, MD;  Location: [place] CV LAB;  Service: Cardiovascular;  Laterality: N/A;  . Lower extremity angiogram Right 11/25/2014    Procedure: Lower Extremity Angiogram;  Surgeon: Elam Dutch, MD;  Location: [place] CV LAB;  Service: Cardiovascular;  Laterality: Right;  . Flexible sigmoidoscopy N/A 12/30/2014    Procedure: FLEXIBLE SIGMOIDOSCOPY;  Surgeon: Ronald Lobo, MD;  Location: [facility] ENDOSCOPY;  Service: Endoscopy;  Laterality: N/A;    Family History  Problem Relation Age of Onset  . Heart disease  Mother   . Hyperlipidemia Mother   . Hypertension Mother   . Diabetes Mother   . Hyperlipidemia Father   . Hypertension Father   . Heart disease Father   . Diabetes Brother   . Heart disease Brother     Heart Disease before age 72  . Hyperlipidemia Brother   . Hypertension Brother   . Heart attack Brother     Social History:  reports that he quit smoking about 26 years ago. His smoking use included Cigarettes. He has a 20 pack-year smoking history. He has never used smokeless tobacco. He reports that he drinks alcohol. He reports that he does not use illicit drugs.  Allergies:  Allergies  Allergen Reactions  . Lisinopril Swelling    Lips and tongue swell  . Niacin And Related Other (See Comments)    unknown  . Norvasc [Amlodipine Besylate] Rash    Flushing  . Penicillins Rash    Medications: I have reviewed the patient's current medications.  Results for orders placed or performed during the hospital encounter of 01/30/15 (from the past 48 hour(s))  Basic metabolic panel     Status: Abnormal   Collection Time: 01/30/15  2:17 AM  Result Value Ref Range   Sodium 135 135 - 145 mmol/L   Potassium 3.8 3.5 - 5.1 mmol/L   Chloride 97 (L) 101 - 111 mmol/L   CO2 29 22 - 32 mmol/L   Glucose, Bld 131 (H) 65 - 99 mg/dL   BUN 29 (H) 6 - 20 mg/dL   Creatinine, Ser 5.64 (H) 0.61 - 1.24 mg/dL   Calcium 8.4 (L) 8.9 - 10.3 mg/dL   GFR calc non Af Amer 9 (L) >60 mL/min   GFR calc Af Amer 11 (L) >60 mL/min    Comment: (NOTE) The eGFR has been calculated using the CKD EPI equation. This calculation has not been validated in all clinical situations. eGFR's persistently <60 mL/min signify possible Chronic Kidney Disease.    Anion gap 9 5 - 15  CBC WITH DIFFERENTIAL     Status: Abnormal   Collection Time: 01/30/15  2:17 AM  Result Value Ref Range   WBC 3.8 (L) 4.0 - 10.5 K/uL   RBC 2.53 (L) 4.22 - 5.81 MIL/uL   Hemoglobin 7.4 (L) 13.0 - 17.0 g/dL   HCT 22.8 (L) 39.0 - 52.0 %   MCV  90.1 78.0 - 100.0 fL   MCH 29.2 26.0 - 34.0 pg   MCHC 32.5 30.0 - 36.0 g/dL   RDW 15.6 (H) 11.5 - 15.5 %   Platelets 56 (L) 150 - 400 K/uL    Comment: REPEATED TO VERIFY SPECIMEN CHECKED FOR CLOTS PLATELET COUNT CONFIRMED BY SMEAR LARGE PLATELETS PRESENT    Neutrophils Relative % 54 43 - 77 %   Neutro Abs 2.1 1.7 - 7.7 K/uL   Lymphocytes Relative 42 12 - 46 %   Lymphs Abs 1.6 0.7 -  4.0 K/uL   Monocytes Relative 3 3 - 12 %   Monocytes Absolute 0.1 0.1 - 1.0 K/uL   Eosinophils Relative 1 0 - 5 %   Eosinophils Absolute 0.0 0.0 - 0.7 K/uL   Basophils Relative 0 0 - 1 %   Basophils Absolute 0.0 0.0 - 0.1 K/uL  Protime-INR     Status: Abnormal   Collection Time: 01/30/15  2:17 AM  Result Value Ref Range   Prothrombin Time 23.5 (H) 11.6 - 15.2 seconds   INR 2.11 (H) 0.00 - 1.49  Type and screen     Status: None   Collection Time: 01/30/15  2:17 AM  Result Value Ref Range   ABO/RH(D) O POS    Antibody Screen NEG    Sample Expiration 02/02/2015   Glucose, capillary     Status: Abnormal   Collection Time: 01/30/15  6:50 AM  Result Value Ref Range   Glucose-Capillary 120 (H) 65 - 99 mg/dL    Dg Chest 1 View  01/30/2015   CLINICAL DATA:  Patient fell this morning landing on the left side. Left hip pain and deformity.  EXAM: CHEST  1 VIEW  COMPARISON:  12/23/2014  FINDINGS: Postoperative changes in the mediastinum. Vascular grafts in the upper chest bilaterally. Calcified and tortuous aorta. Since the previous study, the central line has been removed. Shallow inspiration. Cardiac enlargement without significant vascular congestion or edema. No focal consolidation. No blunting of costophrenic angles.  IMPRESSION: Cardiac enlargement.  No evidence of active pulmonary disease.   Electronically Signed   By: Lucienne Capers M.D.   On: 01/30/2015 04:14   Dg Tibia/fibula Left  01/30/2015   CLINICAL DATA:  Patient fell this morning, landing on the left side. Pain.  EXAM: LEFT TIBIA AND FIBULA - 2  VIEW  COMPARISON:  None.  FINDINGS: Diffuse bone demineralization. No evidence of acute fracture or dislocation. No focal bone lesion or bone destruction. Extensive vascular calcifications. Surgical clips in the soft tissues. Old periosteal reaction in the fibular shaft.  IMPRESSION: Diffuse bone demineralization.  No acute bony abnormalities.   Electronically Signed   By: Lucienne Capers M.D.   On: 01/30/2015 04:48   Dg Hand Complete Left  01/30/2015   CLINICAL DATA:  Patient fell this morning, landing on the left. Pain fifth metacarpal area.  EXAM: LEFT HAND - COMPLETE 3+ VIEW  COMPARISON:  None.  FINDINGS: Diffuse bone demineralization. Diffuse degenerative changes in the interphalangeal joints, first carpometacarpal and metacarpal phalangeal joints, and in the intercarpal joints. No acute fracture or dislocation demonstrated. Soft tissue swelling about the medial aspect of the left hand. Vascular calcifications.  IMPRESSION: Diffuse bone demineralization and degenerative changes. No acute bony abnormalities.   Electronically Signed   By: Lucienne Capers M.D.   On: 01/30/2015 04:10   Dg Hip Unilat With Pelvis 2-3 Views Left  01/30/2015   CLINICAL DATA:  Patient fell onto the left side this morning. Left hip pain and deformity.  EXAM: DG HIP (WITH OR WITHOUT PELVIS) 2-3V LEFT  COMPARISON:  None.  FINDINGS: Acute comminuted inter trochanteric fracture of the left hip with impaction and valgus orientation of the fracture fragments. Mildly displaced lesser trochanteric fragment. Prominent degenerative changes in both hips. No evidence of dislocation of the hip. The visualized pelvis appears intact. SI joints and symphysis pubis are not displaced. Extensive vascular calcifications.  IMPRESSION: Acute comminuted inter trochanteric fracture of the left proximal femur with impaction and valgus orientation of the fracture  fragments.   Electronically Signed   By: Lucienne Capers M.D.   On: 01/30/2015 04:15     Review of Systems  All other systems reviewed and are negative.  Blood pressure 156/50, pulse 114, temperature 98.5 F (36.9 C), temperature source Oral, resp. rate 18, SpO2 93 %. Physical Exam Patient has shortened external rotated left lower extremity. He has pain with attempted range of motion. Assessment/Plan: Assessment: Left intertrochanteric hip fracture with INR of 2 and a hemoglobin of 7 with dialysis Monday Wednesday Friday with recent transfusions including 2 transfusions with 2 units each over the past several months.  Plan: I wrote orders for transfusion of 2 units packed red blood cells. If patient can be dialyzed this morning I will proceed with surgery later this afternoon. This would allow for the transfusion to proceed with dialysis. If patient is dialyzed this afternoon I would plan for surgery tomorrow. If patient is dialyzed tomorrow would plan for transfusion this morning with surgery this afternoon.  Hassen Bruun V 01/30/2015, 8:32 AM

## 2015-01-30 NOTE — Consult Note (Addendum)
Renal Service Consult Note [facility]  Brandon Sharp 01/30/2015 Sol Blazing Requesting Physician:  Dr Wyline Copas  Reason for Consult:  ESRD patient with hip fracture HPI: The patient is a 69 y.o. year-old with hx of CAD/ heart transplant (2001, [facility]) on imuran/sirolimus and prednisone, ESRD on HD MWF at [facility] center.  DVT on coumadin, DM on SSI.  He was 5 times in Nov-Dec for FUO / pulm infiltrates, ultimately was found to have R IJ DVT and put on coumadin and abx for possible septic thrombophlebitis and hasn't had fever issues since.   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 Lifelong anticoagulation was recommended. Vasc surg felt that multiple vascular access complications and procedures to try and keep the central veins open contributed to the DVT risk.  Only Rx option was coumadin.   Here in March for failing RUA AVF and underwent R thigh AVG placement. In June had dehisced thigh graft wound and hematoma requiring evacuation and wound VAC. Went to rehab afterwards for deconditioning. Then was here in August for low BP/ sepsis, MRSA groin wound infection and constipation w fecal impaction.  Had colonoscopy which showed some rectal ulcers.   Last week was here for anemia and got transfused. Now is here after falling in his bedroom last night and has a left hip fracture.  R thigh wound fully healed and HD cath was pulled at last admit.  No recent fevers, no CP, no sob, no recent HD problems per pts wife. Patient not providing much history now, sedated after pain meds.  Past Medical History  Past Medical History  Diagnosis Date  . Diabetes mellitus   . Hypertension   . Myocardial infarction 1985; 1990  . Angina   . Dysrhythmia   . DVT (deep venous thrombosis) ~ 12/2010    LLE  . Anemia   . Blood transfusion 06/1999    post heart transplant, last transfusion 2 weeks ago  01-17-15  . Peripheral vascular disease   . Renal failure     Hemodialysis MWF last 2 years, sse dr [name] nephrology, goes to [place] [facility]  . DVT (deep venous thrombosis) 08/25/2014    Nearly occluded R IJ, and L subclavian DVT  . CHF (congestive heart failure) 2001    beffore transplant; had AICD that was explanted 07/19/99 following successful heart transplant  . Coronary artery disease     transplant cardiologist Dr. Corky Mull [facility])  . Pneumonia     resolved 1'16  . Hemodialysis patient     M_W_F- [facility] location  . Organ transplant     2001-Heart Transplant([facility]), s/p Kidney transplant(failed)  . Mobility impaired     limited standing-uses wheelchair.   Past Surgical History  Past Surgical History  Procedure Laterality Date  . Nephrectomy transplanted organ  2008  . Av fistula repair      rt. a=fore arm fistula  . Av fistula placement  ~ 01/2010    "this was my 2nd fistula placement"  . Heart transplant  06/1999  . Colonoscopy  03/17/2012    Procedure: COLONOSCOPY;  Surgeon: Lear Ng, MD;  [facility];  Service: Endoscopy;  Laterality: N/A;  . Coronary artery bypass graft  1990    CABG X3  . Colonoscopy with propofol N/A 12/14/2013    Procedure: COLONOSCOPY WITH PROPOFOL;  Surgeon: Evette Doffing  Aloha Gell, MD;  Location: [facility];  Service: Endoscopy;  Laterality: N/A;  . Video bronchoscopy Bilateral 04/14/2014    Procedure: VIDEO BRONCHOSCOPY WITH FLUORO;  Surgeon: Collene Gobble, MD;  Location: [place];  Service: Cardiopulmonary;  Laterality: Bilateral;  . Shuntogram Right 05/09/2014    Procedure: FISTULOGRAM;  Surgeon: Conrad Reddell, MD;  Location: [facility] CATH LAB;  Service: Cardiovascular;  Laterality: Right;  . Coronary angioplasty with stent placement  1985; 1990    prior stents in the 80's and 90's then CABG; s/p heart transplant 2001 s/p BMS mid LAD and proximal RCA 04/21/13 [facility])  . Av fistula placement Right  10/20/2014    Procedure: INSERTION OF ARTERIOVENOUS (AV) GORE-TEX GRAFT THIGH;  Surgeon: Conrad [place], MD;  Location: [place];  Service: Vascular;  Laterality: Right;  . Hematoma evacuation Right 11/21/2014    Procedure: EVACUATION HEMATOMA Right Groin;  Surgeon: Conrad [place], MD;  Location: [place];  Service: Vascular;  Laterality: Right;  . Application of wound vac Right 11/21/2014    Procedure: APPLICATION OF WOUND VAC Right Groin;  Surgeon: Conrad [employer], MD;  Location: [place];  Service: Vascular;  Laterality: Right;  . Peripheral vascular catheterization N/A 11/25/2014    Procedure: Abdominal Aortogram;  Surgeon: Elam Dutch, MD;  Location: [place] CV LAB;  Service: Cardiovascular;  Laterality: N/A;  . Lower extremity angiogram Right 11/25/2014    Procedure: Lower Extremity Angiogram;  Surgeon: Elam Dutch, MD;  Location: [place] CV LAB;  Service: Cardiovascular;  Laterality: Right;  . Flexible sigmoidoscopy N/A 12/30/2014    Procedure: FLEXIBLE SIGMOIDOSCOPY;  Surgeon: Ronald Lobo, MD;  Location: [facility] ENDOSCOPY;  Service: Endoscopy;  Laterality: N/A;   Family History  Family History  Problem Relation Age of Onset  . Heart disease Mother   . Hyperlipidemia Mother   . Hypertension Mother   . Diabetes Mother   . Hyperlipidemia Father   . Hypertension Father   . Heart disease Father   . Diabetes Brother   . Heart disease Brother     Heart Disease before age 79  . Hyperlipidemia Brother   . Hypertension Brother   . Heart attack Brother    Social History  reports that he quit smoking about 26 years ago. His smoking use included Cigarettes. He has a 20 pack-year smoking history. He has never used smokeless tobacco. He reports that he drinks alcohol. He reports that he does not use illicit drugs. Allergies  Allergies  Allergen Reactions  . Lisinopril Swelling    Lips and tongue swell  . Niacin And Related Other (See Comments)    unknown  . Norvasc [Amlodipine Besylate] Rash     Flushing  . Penicillins Rash   Home medications Prior to Admission medications   Medication Sig Start Date End Date Taking? Authorizing Provider  atorvastatin (LIPITOR) 40 MG tablet Take 40 mg by mouth every evening.    Yes Historical Provider, MD  azaTHIOprine (IMURAN) 50 MG tablet Take 75 mg by mouth every morning.    Yes Historical Provider, MD  b complex-vitamin c-folic acid (NEPHRO-VITE) 0.8 MG TABS tablet Take 1 tablet by mouth daily.    Yes Historical Provider, MD  calcium acetate (PHOSLO) 667 MG capsule Take 1 capsule (667 mg total) by mouth 2 (two) times daily with a meal. 12/14/14  Yes Ivan Anchors Love, PA-C  carvedilol (COREG) 6.25 MG tablet Take 1 tablet (6.25 mg total) by mouth at bedtime. 04/15/14  Yes  Geradine Girt, DO  colesevelam (WELCHOL) 625 MG tablet Take 3,750 mg by mouth See admin instructions. Takes 6 tabs daily with meals per patient   Yes Historical Provider, MD  Darbepoetin Alfa (ARANESP) 200 MCG/0.4ML SOSY injection Inject 0.4 mLs (200 mcg total) into the vein every Wednesday with hemodialysis. 12/31/14  Yes Allie Bossier, MD  doxercalciferol (HECTOROL) 4 MCG/2ML injection Inject 1.5 mLs (3 mcg total) into the vein every Monday, Wednesday, and Friday with hemodialysis. 12/31/14  Yes Allie Bossier, MD  HUMALOG KWIKPEN 100 UNIT/ML SOPN Inject 4-10 Units into the skin 2 (two) times daily. Per sliding scale 11/17/12  Yes Historical Provider, MD  isosorbide dinitrate (ISOCHRON) 40 MG CR tablet Take 40 mg by mouth every 8 (eight) hours. Take every 8 hours per patient 10/03/14  Yes Historical Provider, MD  oxyCODONE (OXY IR/ROXICODONE) 5 MG immediate release tablet Take 1 tablet (5 mg total) by mouth every 6 (six) hours as needed for moderate pain. 12/14/14  Yes Ivan Anchors Love, PA-C  pantoprazole (PROTONIX) 40 MG tablet Take 1 tablet (40 mg total) by mouth daily. 12/02/14  Yes Silver Huguenin Elgergawy, MD  polyethylene glycol (MIRALAX / GLYCOLAX) packet Take 17 g by mouth daily. 12/31/14  Yes  Allie Bossier, MD  predniSONE (DELTASONE) 5 MG tablet Take 1 tablet (5 mg total) by mouth daily with breakfast. 08/29/14  Yes Orson Eva, MD  senna-docusate (SENOKOT-S) 8.6-50 MG per tablet Take 3 tablets by mouth 2 (two) times daily. 12/14/14  Yes Ivan Anchors Love, PA-C  Sirolimus 0.5 MG TABS Take 3 tablets by mouth every morning.  10/10/14  Yes Historical Provider, MD  Vancomycin (VANCOCIN) 750 MG/150ML SOLN Inject 150 mLs (750 mg total) into the vein every Monday, Wednesday, and Friday with hemodialysis. 12/31/14  Yes Allie Bossier, MD  warfarin (COUMADIN) 5 MG tablet Please consult: Inpatient pharmacy to continue dosing warfarin and monitor Patient taking differently: Take 5 mg by mouth at bedtime. Please consult: Inpatient pharmacy to continue dosing warfarin and monitor 12/02/14  Yes Albertine Patricia, MD   Liver Function Tests No results for input(s): AST, ALT, ALKPHOS, BILITOT, PROT, ALBUMIN in the last 168 hours. No results for input(s): LIPASE, AMYLASE in the last 168 hours. CBC  Recent Labs Lab 01/27/15 1220 01/30/15 0217  WBC 2.9* 3.8*  NEUTROABS  --  2.1  HGB 6.1* 7.4*  HCT 19.0* 22.8*  MCV 88.8 90.1  PLT 44* 56*   Basic Metabolic Panel  Recent Labs Lab 01/27/15 1220 01/30/15 0217  NA 135 135  K 3.2* 3.8  CL 96* 97*  CO2 29 29  GLUCOSE 81 131*  BUN 27* 29*  CREATININE 6.18* 5.64*  CALCIUM 8.5* 8.4*    Filed Vitals:   01/30/15 0530 01/30/15 0541 01/30/15 0637 01/30/15 0811  BP: 155/49  146/60 156/50  Pulse: 103  113 114  Temp:  97.8 F (36.6 C) 98.4 F (36.9 C) 98.5 F (36.9 C)  TempSrc:  Oral Oral Oral  Resp: 20  18 18   SpO2: 97%  97% 93%   Exam Alert, no distress a bit frail No rash, cyanosis or gangrene Sclera anicteric, throat clear No jvd Chest clear bilat RRR no MRG Abd soft ntnd no mass or ascites +bs GU normal male 1+ pretib edema bilat R thigh AVG +bruit, wounds have healed over no signs of infection Neuro is alert, ox 3, nf   MWF NW   4h   68kg  2/2 bath  Heparin none  R thigh AVG Aranesp 200/ wed Hect 3 ug tiw  Assessment: 1. Fall / L hip fracture 2. ESRD on HD MWF 3. Anemia - recurrent issue, has low WBC/ plts as well. May be BM suppression from imuran 4. Heart Tx 2001 - on sirolimus/ pred/ imuran 5. 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 DM 2 on SSI 7. Debility - chronic issue 8. DNR   Plan- HD today 2nd shift, cont max aranesp, check Fe / tibc, transfuse w HD today 2u prbc in anticipation of surgery  Kelly Splinter MD (pgr) [phone]    [phone] 01/30/2015, 9:20 AM

## 2015-01-30 NOTE — Procedures (Signed)
Patient was seen on dialysis and the procedure was supervised.  BFR 400  Via AVG BP is  93/41.   Patient appears to be tolerating treatment well  Brandon Sharp A 01/30/2015

## 2015-01-30 NOTE — ED Notes (Signed)
MD Wickline at bedside. 

## 2015-01-30 NOTE — Progress Notes (Signed)
[facility]  Patient Status: Active (receiving services up to time of hospitalization)  [facility] is providing the following services: RN, PT and OT  If patient discharges after hours, please call [phone].   Brandon Sharp 01/30/2015, 10:29 AM

## 2015-01-30 NOTE — Progress Notes (Signed)
ANTICOAGULATION CONSULT NOTE - Initial Consult  Pharmacy Consult for Heparin (holdin warfarin) Indication: DVT  Allergies  Allergen Reactions  . Lisinopril Swelling    Lips and tongue swell  . Niacin And Related Other (See Comments)    unknown  . Norvasc [Amlodipine Besylate] Rash    Flushing  . Penicillins Rash   Vital Signs: Temp: 98.4 F (36.9 C) (09/05 0637) Temp Source: Oral (09/05 0637) BP: 146/60 mmHg (09/05 0637) Pulse Rate: 113 (09/05 0637)  Labs:  Recent Labs  01/27/15 1220 01/30/15 0217  HGB 6.1* 7.4*  HCT 19.0* 22.8*  PLT 44* 56*  LABPROT 24.6* 23.5*  INR 2.24* 2.11*  CREATININE 6.18* 5.64*    Medical History: Past Medical History  Diagnosis Date  . Diabetes mellitus   . Hypertension   . Myocardial infarction 1985; 1990  . Angina   . Dysrhythmia   . DVT (deep venous thrombosis) ~ 12/2010    LLE  . Anemia   . Blood transfusion 06/1999    post heart transplant, last transfusion 2 weeks ago 01-17-15  . Peripheral vascular disease   . Renal failure     Hemodialysis MWF last 2 years, sse dr [name] nephrology, goes to [place] [facility]  . DVT (deep venous thrombosis) 08/25/2014    Nearly occluded R IJ, and L subclavian DVT  . CHF (congestive heart failure) 2001    beffore transplant; had AICD that was explanted 07/19/99 following successful heart transplant  . Coronary artery disease     transplant cardiologist Dr. Corky Mull [facility])  . Pneumonia     resolved 1'16  . Hemodialysis patient     M_W_F- [facility] location  . Organ transplant     2001-Heart Transplant([facility]), s/p Kidney transplant(failed)  . Mobility impaired     limited standing-uses wheelchair.    Assessment: Holding warfarin and starting heparin while waiting ortho consult for hip fracture. INR is 2.11.   Goal of Therapy:  Heparin level 0.3-0.7 units/ml Monitor platelets by anticoagulation protocol: Yes   Plan:  -Check INR at 1200 per MD request -Start  heparin when INR < 2  Narda Bonds 01/30/2015,7:03 AM

## 2015-01-30 NOTE — ED Notes (Signed)
Patient returned from x-ray-medicated with Dilaudid as ordered for left hip pain, patient unable to straighten his left lower extremity in x-ray/medicated and left leg straightened with some difficulty-O2 sat decreased to 80's after Dilaudid, O2 applied 3 liters nasal cannula with sat increased to 100%-wife at bedside

## 2015-01-30 NOTE — Progress Notes (Signed)
[facility] PROGRESS NOTE  Brandon Sharp BJS:[id number] DOB: [birth year] DOA: 01/30/2015 PCP: [name], MD  Assessment/Plan:  1. Closed left hip fracture The patient is s/p mechanical fall without loc Patient is found to have left hip intertrochanteric fracture with impaction. Dr. Sharol Given has seen pt, plans for surgery on 9/6 Pain management with morphine and oxycodone, additional management per hip fracture protocol.  2.preoperative evaluation.  A) Cardiac risk: Based on RCRI  >History of ischemic heart disease with cardiac transplant history in 2001 >Diabetes mellitus requiring treatment with insulin >Preoperative serum creatinine >2.0 mg/dL  2D echocardiogram was ordered here, pending Last documented echo from 2015 at [facility] with EF 60-65%, mild AR Pt is s/p cath in 09/2013 at [facility] with mod non-obstructive disease  B) Pulmonary risk: Recommend incentive spirometry. Cont good pulmunary toilet.  C) bleeding risk With chronic anemia as well as thrombocytopenia the patient is at high risk for significant bleeding, on top of that the patient requires therapeutic anticoagulation for his DVT. Heparin per pharmacy protocol without bolus after the INR is less than 2.  3. ESRD on hemodialysis. S/p failed renal transplant Cont with HD as per Nephrology Stable thus far  4. Diabetes mellitus. Continuing sliding scale insulin.  5. Chronic anemia and chronic thrombocytopenia. Significant bleeding risk as mentioned above. Patient recently received 2 units of PRBC. Receiving supplements EPO with hemodialysis. Continue iron supplimentation.  6.Constipation. Improved With Senokot regimen which the patient is continuing.  7.recent groin infection. Patient completed a course of vancomycin.  8. Cardiac and renal transplant Continued on immunosuppressive medication He will also need stress dose steroids preoperatively.  Code Status: DNR Family Communication: Pt  in room (indicate person spoken with, relationship, and if by phone, the number) Disposition Plan: Pending   Consultants:  Orthopedic Surgery  Procedures:    Antibiotics:    HPI/Subjective: Pt without complaints. Denies cp or sob  Objective: Filed Vitals:   01/30/15 1524 01/30/15 1530 01/30/15 1539 01/30/15 1600  BP: 105/55 131/40 134/60   Pulse: 103 96 94   Temp: 97.6 F (36.4 C) 97.7 F (36.5 C) 97.7 F (36.5 C)   TempSrc: Oral Oral Oral   Resp:   14   Height:    6' (1.829 m)  Weight:      SpO2:        Intake/Output Summary (Last 24 hours) at 01/30/15 1646 Last data filed at 01/30/15 1539  Gross per 24 hour  Intake    670 ml  Output      0 ml  Net    670 ml   Filed Weights   01/30/15 1245  Weight: 71 kg (156 lb 8.4 oz)    Exam:   General:  Awake, in nad  Cardiovascular: regular, s1, s2  Respiratory: normal resp effort,no wheezing  Abdomen: soft,nondistended  Musculoskeletal: perfused, no clubbing   Data Reviewed: Basic Metabolic Panel:  Recent Labs Lab 01/27/15 1220 01/30/15 0217 01/30/15 1300  NA 135 135 136  K 3.2* 3.8 3.6  CL 96* 97* 98*  CO2 29 29 30   GLUCOSE 81 131* 113*  BUN 27* 29* 31*  CREATININE 6.18* 5.64* 6.24*  CALCIUM 8.5* 8.4* 8.3*  PHOS  --   --  3.0   Liver Function Tests:  Recent Labs Lab 01/30/15 1300  ALBUMIN 2.2*   No results for input(s): LIPASE, AMYLASE in the last 168 hours. No results for input(s): AMMONIA in the last 168 hours. CBC:  Recent Labs Lab 01/27/15 1220  01/30/15 0217 01/30/15 1144  WBC 2.9* 3.8* 3.0*  NEUTROABS  --  2.1 1.4*  HGB 6.1* 7.4* 6.9*  HCT 19.0* 22.8* 21.2*  MCV 88.8 90.1 88.7  PLT 44* 56* 34*   Cardiac Enzymes: No results for input(s): CKTOTAL, CKMB, CKMBINDEX, TROPONINI in the last 168 hours. BNP (last 3 results) No results for input(s): BNP in the last 8760 hours.  ProBNP (last 3 results)  Recent Labs  03/27/14 1747 04/12/14 0105  PROBNP 37853.0* 45045.0*     CBG:  Recent Labs Lab 01/30/15 0650 01/30/15 1146  GLUCAP 120* 100*    Recent Results (from the past 240 hour(s))  MRSA PCR Screening     Status: None   Collection Time: 01/30/15  8:45 AM  Result Value Ref Range Status   MRSA by PCR NEGATIVE NEGATIVE Final    Comment:        The GeneXpert MRSA Assay (FDA approved for NASAL specimens only), is one component of a comprehensive MRSA colonization surveillance program. It is not intended to diagnose MRSA infection nor to guide or monitor treatment for MRSA infections.      Studies: Dg Chest 1 View  01/30/2015   CLINICAL DATA:  Patient fell this morning landing on the left side. Left hip pain and deformity.  EXAM: CHEST  1 VIEW  COMPARISON:  12/23/2014  FINDINGS: Postoperative changes in the mediastinum. Vascular grafts in the upper chest bilaterally. Calcified and tortuous aorta. Since the previous study, the central line has been removed. Shallow inspiration. Cardiac enlargement without significant vascular congestion or edema. No focal consolidation. No blunting of costophrenic angles.  IMPRESSION: Cardiac enlargement.  No evidence of active pulmonary disease.   Electronically Signed   By: Lucienne Capers M.D.   On: 01/30/2015 04:14   Dg Tibia/fibula Left  01/30/2015   CLINICAL DATA:  Patient fell this morning, landing on the left side. Pain.  EXAM: LEFT TIBIA AND FIBULA - 2 VIEW  COMPARISON:  None.  FINDINGS: Diffuse bone demineralization. No evidence of acute fracture or dislocation. No focal bone lesion or bone destruction. Extensive vascular calcifications. Surgical clips in the soft tissues. Old periosteal reaction in the fibular shaft.  IMPRESSION: Diffuse bone demineralization.  No acute bony abnormalities.   Electronically Signed   By: Lucienne Capers M.D.   On: 01/30/2015 04:48   Dg Hand Complete Left  01/30/2015   CLINICAL DATA:  Patient fell this morning, landing on the left. Pain fifth metacarpal area.  EXAM: LEFT  HAND - COMPLETE 3+ VIEW  COMPARISON:  None.  FINDINGS: Diffuse bone demineralization. Diffuse degenerative changes in the interphalangeal joints, first carpometacarpal and metacarpal phalangeal joints, and in the intercarpal joints. No acute fracture or dislocation demonstrated. Soft tissue swelling about the medial aspect of the left hand. Vascular calcifications.  IMPRESSION: Diffuse bone demineralization and degenerative changes. No acute bony abnormalities.   Electronically Signed   By: Lucienne Capers M.D.   On: 01/30/2015 04:10   Dg Hip Unilat With Pelvis 2-3 Views Left  01/30/2015   CLINICAL DATA:  Patient fell onto the left side this morning. Left hip pain and deformity.  EXAM: DG HIP (WITH OR WITHOUT PELVIS) 2-3V LEFT  COMPARISON:  None.  FINDINGS: Acute comminuted inter trochanteric fracture of the left hip with impaction and valgus orientation of the fracture fragments. Mildly displaced lesser trochanteric fragment. Prominent degenerative changes in both hips. No evidence of dislocation of the hip. The visualized pelvis appears intact. SI joints and symphysis  pubis are not displaced. Extensive vascular calcifications.  IMPRESSION: Acute comminuted inter trochanteric fracture of the left proximal femur with impaction and valgus orientation of the fracture fragments.   Electronically Signed   By: Lucienne Capers M.D.   On: 01/30/2015 04:15    Scheduled Meds: . sodium chloride   Intravenous Once  . sodium chloride   Intravenous Once  . atorvastatin  40 mg Oral QPM  . azaTHIOprine  75 mg Oral q morning - 10a  . calcium acetate  667 mg Oral BID WC  . carvedilol  6.25 mg Oral QHS  . colesevelam  1,875 mg Oral BID WC  . [START ON 02/01/2015] darbepoetin (ARANESP) injection - DIALYSIS  200 mcg Intravenous Q Wed-HD  . doxercalciferol  3 mcg Intravenous Q M,W,F-HD  . [START ON 01/31/2015] feeding supplement  1 Container Oral BID BM  . [START ON 01/31/2015] feeding supplement (PRO-STAT SUGAR FREE 64)  30  mL Oral Q1500  .  HYDROmorphone (DILAUDID) injection  1 mg Intramuscular Once  . [START ON 01/31/2015] Influenza vac split quadrivalent PF  0.5 mL Intramuscular Tomorrow-1000  . insulin aspart  0-9 Units Subcutaneous Q6H  . isosorbide dinitrate  40 mg Oral 3 times per day  . pantoprazole  40 mg Oral Daily  . phytonadione (VITAMIN K) IV  5 mg Intravenous Once  . predniSONE  5 mg Oral Q breakfast  . senna-docusate  3 tablet Oral BID  . Sirolimus  1.5 mg Oral q morning - 10a   Continuous Infusions:   Principal Problem:   Closed left hip fracture Active Problems:   Heart transplanted   S/P CABG (coronary artery bypass graft)   DM (diabetes mellitus), type 2 with renal complications   ESRD on hemodialysis   Immunosuppressed status   Kidney transplant failure   Protein-calorie malnutrition, severe   Thrombocytopenia   Anemia due to chronic kidney disease   Essential hypertension   Brandon Sharp K  [facility] Pager [phone]. If 7PM-7AM, please contact night-coverage at [email or web address], password [facility] 01/30/2015, 4:46 PM  LOS: 0 days

## 2015-01-30 NOTE — Progress Notes (Signed)
Patient ID: Brandon Sharp, male   DOB: 1945/07/19, 69 y.o.   MRN: [id number] The operating room is on a holiday schedule today and will not be able to accommodate surgery today. Plan for surgery tomorrow after 4 PM

## 2015-01-30 NOTE — ED Notes (Signed)
Claiborne Billings RN called MD Christy Gentles and notified about unable to obtain temp.

## 2015-01-30 NOTE — ED Notes (Signed)
Patient states pain decreased to 6-7 left upper thigh area

## 2015-01-30 NOTE — Progress Notes (Signed)
Admission note:  Arrival Method: Pt arrived on stretcher with [facility] Orientation: Alert and oriented x 4 Telemetry: N/A Assessment: Completed, see doc flowsheets Skin: Unable to do a full skin assessment at this time due to patient being unable to turn due to pain. Pain medication administered, oncoming RN to do full skin assessment.  IV: Right AC IV saline locked. Site is clean, dry and intact.  Pain: Pt states pain is 8/10 in left leg. Pain medication administered  Tubes: Right thigh AV fistula. Positive for thrill and bruit. Old right upper arm fistula Safety Measures: Bed in lowest position, non-slip socks placed, call light within reach Fall Prevention Safety Plan: Reviewed with patient and patient family. Patient is a high fall risk Admission Screening: In progress 6700 Orientation: Patient has been oriented to the unit, staff and to the room. Patient lying in bed with wife present at bedside. Orders have been reviewed and implemented, report given to oncoming RN. Call light within reach.   Shelbie Hutching, RN, BSN

## 2015-01-31 ENCOUNTER — Inpatient Hospital Stay (HOSPITAL_COMMUNITY): Payer: Medicare Other | Admitting: Anesthesiology

## 2015-01-31 ENCOUNTER — Encounter (HOSPITAL_COMMUNITY)
Admission: EM | Disposition: A | Discharge status: Other Acute Inpatient Hospital | Payer: Self-pay | Source: Home / Self Care | Attending: Internal Medicine

## 2015-01-31 ENCOUNTER — Ambulatory Visit (HOSPITAL_COMMUNITY): Payer: Medicare Other

## 2015-01-31 ENCOUNTER — Inpatient Hospital Stay (HOSPITAL_COMMUNITY): Payer: Medicare Other

## 2015-01-31 DIAGNOSIS — Z951 Presence of aortocoronary bypass graft: Secondary | ICD-10-CM

## 2015-01-31 DIAGNOSIS — D696 Thrombocytopenia, unspecified: Secondary | ICD-10-CM

## 2015-01-31 DIAGNOSIS — E440 Moderate protein-calorie malnutrition: Secondary | ICD-10-CM | POA: Insufficient documentation

## 2015-01-31 HISTORY — PX: INTRAMEDULLARY (IM) NAIL INTERTROCHANTERIC: SHX5875

## 2015-01-31 LAB — COMPREHENSIVE METABOLIC PANEL
ALK PHOS: 69 U/L (ref 38–126)
ALT: 18 U/L (ref 17–63)
ANION GAP: 8 (ref 5–15)
AST: 25 U/L (ref 15–41)
Albumin: 2.3 g/dL — ABNORMAL LOW (ref 3.5–5.0)
BILIRUBIN TOTAL: 1.9 mg/dL — AB (ref 0.3–1.2)
BUN: 15 mg/dL (ref 6–20)
CALCIUM: 8 mg/dL — AB (ref 8.9–10.3)
CO2: 30 mmol/L (ref 22–32)
Chloride: 98 mmol/L — ABNORMAL LOW (ref 101–111)
Creatinine, Ser: 3.68 mg/dL — ABNORMAL HIGH (ref 0.61–1.24)
GFR, EST AFRICAN AMERICAN: 18 mL/min — AB (ref >60)
GFR, EST NON AFRICAN AMERICAN: 15 mL/min — AB (ref >60)
Glucose, Bld: 142 mg/dL — ABNORMAL HIGH (ref 65–99)
Potassium: 4.6 mmol/L (ref 3.5–5.1)
SODIUM: 136 mmol/L (ref 135–145)
TOTAL PROTEIN: 5.1 g/dL — AB (ref 6.5–8.1)

## 2015-01-31 LAB — BASIC METABOLIC PANEL
ANION GAP: 10 (ref 5–15)
BUN: 19 mg/dL (ref 6–20)
CALCIUM: 8.3 mg/dL — AB (ref 8.9–10.3)
CO2: 28 mmol/L (ref 22–32)
Chloride: 100 mmol/L — ABNORMAL LOW (ref 101–111)
Creatinine, Ser: 4.12 mg/dL — ABNORMAL HIGH (ref 0.61–1.24)
GFR calc Af Amer: 16 mL/min — ABNORMAL LOW (ref >60)
GFR calc non Af Amer: 13 mL/min — ABNORMAL LOW (ref >60)
GLUCOSE: 106 mg/dL — AB (ref 65–99)
Potassium: 5 mmol/L (ref 3.5–5.1)
Sodium: 138 mmol/L (ref 135–145)

## 2015-01-31 LAB — CBC
HCT: 28.5 % — ABNORMAL LOW (ref 39.0–52.0)
HEMOGLOBIN: 9.7 g/dL — AB (ref 13.0–17.0)
MCH: 29.7 pg (ref 26.0–34.0)
MCHC: 34 g/dL (ref 30.0–36.0)
MCV: 87.2 fL (ref 78.0–100.0)
Platelets: 27 10*3/uL — CL (ref 150–400)
RBC: 3.27 MIL/uL — ABNORMAL LOW (ref 4.22–5.81)
RDW: 15.7 % — ABNORMAL HIGH (ref 11.5–15.5)
WBC: 3.3 10*3/uL — ABNORMAL LOW (ref 4.0–10.5)

## 2015-01-31 LAB — POCT I-STAT 4, (NA,K, GLUC, HGB,HCT)
GLUCOSE: 106 mg/dL — AB (ref 65–99)
HCT: 26 % — ABNORMAL LOW (ref 39.0–52.0)
Hemoglobin: 8.8 g/dL — ABNORMAL LOW (ref 13.0–17.0)
Potassium: 4.5 mmol/L (ref 3.5–5.1)
Sodium: 137 mmol/L (ref 135–145)

## 2015-01-31 LAB — GLUCOSE, CAPILLARY
GLUCOSE-CAPILLARY: 100 mg/dL — AB (ref 65–99)
GLUCOSE-CAPILLARY: 116 mg/dL — AB (ref 65–99)
Glucose-Capillary: 97 mg/dL (ref 65–99)
Glucose-Capillary: 107 mg/dL — ABNORMAL HIGH (ref 65–99)
Glucose-Capillary: 110 mg/dL — ABNORMAL HIGH (ref 65–99)

## 2015-01-31 LAB — HEPARIN LEVEL (UNFRACTIONATED): HEPARIN UNFRACTIONATED: 0.15 [IU]/mL — AB (ref 0.30–0.70)

## 2015-01-31 LAB — PREPARE RBC (CROSSMATCH)

## 2015-01-31 SURGERY — FIXATION, FRACTURE, INTERTROCHANTERIC, WITH INTRAMEDULLARY ROD
Anesthesia: General | Site: Hip | Laterality: Left

## 2015-01-31 MED ORDER — MEPERIDINE HCL 25 MG/ML IJ SOLN: 6.2500 mg | INTRAMUSCULAR | Status: DC | PRN

## 2015-01-31 MED ORDER — PHENYLEPHRINE HCL 10 MG/ML IJ SOLN
10.0000 mg | INTRAVENOUS | Status: DC | PRN
  Administered 2015-01-31: 25 ug/min via INTRAVENOUS

## 2015-01-31 MED ORDER — SIROLIMUS 0.5 MG PO TABS
1.5000 mg | ORAL_TABLET | Freq: Every morning | ORAL | Status: DC
  Administered 2015-02-01 – 2015-02-02 (×2): 1.5 mg via ORAL
  Filled 2015-01-31: qty 3

## 2015-01-31 MED ORDER — HYDROMORPHONE HCL 1 MG/ML IJ SOLN
1.0000 mg | INTRAMUSCULAR | Status: DC | PRN
  Administered 2015-02-01: 1 mg via INTRAVENOUS
  Filled 2015-01-31: qty 1

## 2015-01-31 MED ORDER — HEPARIN (PORCINE) IN NACL 100-0.45 UNIT/ML-% IJ SOLN: 1000.0000 [IU]/h | INTRAMUSCULAR | Status: DC

## 2015-01-31 MED ORDER — HYDROCODONE-ACETAMINOPHEN 5-325 MG PO TABS
1.0000 | ORAL_TABLET | ORAL | Status: DC | PRN
  Administered 2015-01-31 – 2015-02-01 (×3): 2 via ORAL
  Administered 2015-02-02 – 2015-02-03 (×2): 1 via ORAL
  Filled 2015-01-31 (×3): qty 2
  Filled 2015-01-31: qty 1

## 2015-01-31 MED ORDER — PROMETHAZINE HCL 25 MG/ML IJ SOLN
12.5000 mg | Freq: Once | INTRAMUSCULAR | Status: AC
  Administered 2015-01-31: 12.5 mg via INTRAVENOUS
  Filled 2015-01-31: qty 1

## 2015-01-31 MED ORDER — SODIUM CHLORIDE 0.9 % IV SOLN
INTRAVENOUS | Status: DC
  Administered 2015-01-31: 15:00:00 via INTRAVENOUS

## 2015-01-31 MED ORDER — PROMETHAZINE HCL 25 MG/ML IJ SOLN: 6.2500 mg | INTRAMUSCULAR | Status: DC | PRN

## 2015-01-31 MED ORDER — HYDROCORTISONE NA SUCCINATE PF 100 MG IJ SOLR
INTRAMUSCULAR | Status: AC
  Filled 2015-01-31: qty 2

## 2015-01-31 MED ORDER — CLINDAMYCIN PHOSPHATE 600 MG/50ML IV SOLN
600.0000 mg | Freq: Four times a day (QID) | INTRAVENOUS | Status: AC
  Administered 2015-01-31 – 2015-02-01 (×3): 600 mg via INTRAVENOUS
  Filled 2015-01-31 (×3): qty 50

## 2015-01-31 MED ORDER — METHOCARBAMOL 1000 MG/10ML IJ SOLN
500.0000 mg | Freq: Four times a day (QID) | INTRAVENOUS | Status: DC | PRN
  Filled 2015-01-31: qty 5

## 2015-01-31 MED ORDER — ACETAMINOPHEN 650 MG RE SUPP: 650.0000 mg | Freq: Four times a day (QID) | RECTAL | Status: DC | PRN

## 2015-01-31 MED ORDER — SODIUM CHLORIDE 0.9 % IV SOLN: Freq: Once | INTRAVENOUS | Status: AC

## 2015-01-31 MED ORDER — ACETAMINOPHEN 325 MG PO TABS
650.0000 mg | ORAL_TABLET | Freq: Four times a day (QID) | ORAL | Status: DC | PRN
  Administered 2015-01-31: 650 mg via ORAL
  Filled 2015-01-31: qty 2

## 2015-01-31 MED ORDER — LIDOCAINE HCL (CARDIAC) 20 MG/ML IV SOLN
INTRAVENOUS | Status: DC | PRN
  Administered 2015-01-31: 20 mg via INTRAVENOUS

## 2015-01-31 MED ORDER — METOCLOPRAMIDE HCL 5 MG/ML IJ SOLN
5.0000 mg | Freq: Three times a day (TID) | INTRAMUSCULAR | Status: DC | PRN
Start: 2015-01-31 — End: 2015-02-03

## 2015-01-31 MED ORDER — METOCLOPRAMIDE HCL 5 MG PO TABS
5.0000 mg | ORAL_TABLET | Freq: Three times a day (TID) | ORAL | Status: DC | PRN
Start: 2015-01-31 — End: 2015-02-03

## 2015-01-31 MED ORDER — CETYLPYRIDINIUM CHLORIDE 0.05 % MT LIQD
7.0000 mL | Freq: Two times a day (BID) | OROMUCOSAL | Status: DC
Start: 2015-01-31 — End: 2015-02-03
  Administered 2015-01-31 – 2015-02-02 (×5): 7 mL via OROMUCOSAL

## 2015-01-31 MED ORDER — SODIUM CHLORIDE 0.9 % IV SOLN
INTRAVENOUS | Status: DC | PRN
Start: 2015-01-31 — End: 2015-01-31
  Administered 2015-01-31: 16:00:00 via INTRAVENOUS

## 2015-01-31 MED ORDER — METHOCARBAMOL 500 MG PO TABS
500.0000 mg | ORAL_TABLET | Freq: Four times a day (QID) | ORAL | Status: DC | PRN
  Administered 2015-02-02 (×2): 500 mg via ORAL
  Filled 2015-01-31 (×2): qty 1

## 2015-01-31 MED ORDER — FENTANYL CITRATE (PF) 100 MCG/2ML IJ SOLN
INTRAMUSCULAR | Status: DC | PRN
  Administered 2015-01-31: 50 ug via INTRAVENOUS
  Administered 2015-01-31: 100 ug via INTRAVENOUS

## 2015-01-31 MED ORDER — ROCURONIUM BROMIDE 100 MG/10ML IV SOLN
INTRAVENOUS | Status: DC | PRN
  Administered 2015-01-31: 25 mg via INTRAVENOUS

## 2015-01-31 MED ORDER — ACETAMINOPHEN 325 MG PO TABS: 650.0000 mg | ORAL_TABLET | Freq: Four times a day (QID) | ORAL | Status: DC | PRN

## 2015-01-31 MED ORDER — HYDROMORPHONE HCL 1 MG/ML IJ SOLN
INTRAMUSCULAR | Status: AC
  Filled 2015-01-31: qty 1

## 2015-01-31 MED ORDER — FENTANYL CITRATE (PF) 250 MCG/5ML IJ SOLN
INTRAMUSCULAR | Status: AC
  Filled 2015-01-31: qty 5

## 2015-01-31 MED ORDER — HYDROCORTISONE NA SUCCINATE PF 1000 MG IJ SOLR
INTRAMUSCULAR | Status: DC | PRN
  Administered 2015-01-31: 100 mg via INTRAVENOUS

## 2015-01-31 MED ORDER — NEOSTIGMINE METHYLSULFATE 10 MG/10ML IV SOLN
INTRAVENOUS | Status: DC | PRN
  Administered 2015-01-31: 3 mg via INTRAVENOUS

## 2015-01-31 MED ORDER — SODIUM CHLORIDE 0.9 % IV SOLN
Freq: Once | INTRAVENOUS | Status: AC
  Administered 2015-01-31: 12:00:00 via INTRAVENOUS

## 2015-01-31 MED ORDER — GLYCOPYRROLATE 0.2 MG/ML IJ SOLN
INTRAMUSCULAR | Status: DC | PRN
  Administered 2015-01-31: 0.4 mg via INTRAVENOUS

## 2015-01-31 MED ORDER — HYDROMORPHONE HCL 1 MG/ML IJ SOLN
0.2500 mg | INTRAMUSCULAR | Status: DC | PRN
  Administered 2015-01-31: 0.25 mg via INTRAVENOUS

## 2015-01-31 MED ORDER — HEPARIN (PORCINE) IN NACL 100-0.45 UNIT/ML-% IJ SOLN
1000.0000 [IU]/h | INTRAMUSCULAR | Status: DC
  Administered 2015-01-31: 1000 [IU]/h via INTRAVENOUS
  Filled 2015-01-31: qty 250

## 2015-01-31 MED ORDER — SODIUM CHLORIDE 0.9 % IV SOLN
INTRAVENOUS | Status: DC | PRN
  Administered 2015-01-31: 15:00:00 via INTRAVENOUS

## 2015-01-31 MED ORDER — SODIUM CHLORIDE 0.9 % IV SOLN: INTRAVENOUS | Status: DC

## 2015-01-31 MED ORDER — 0.9 % SODIUM CHLORIDE (POUR BTL) OPTIME
TOPICAL | Status: DC | PRN
  Administered 2015-01-31: 1000 mL

## 2015-01-31 MED ORDER — ONDANSETRON HCL 4 MG PO TABS
4.0000 mg | ORAL_TABLET | Freq: Four times a day (QID) | ORAL | Status: DC | PRN
  Administered 2015-01-31: 4 mg via ORAL
  Filled 2015-01-31: qty 1

## 2015-01-31 MED ORDER — PROPOFOL 10 MG/ML IV BOLUS
INTRAVENOUS | Status: DC | PRN
  Administered 2015-01-31: 80 mg via INTRAVENOUS

## 2015-01-31 MED ORDER — ONDANSETRON HCL 4 MG/2ML IJ SOLN: 4.0000 mg | Freq: Four times a day (QID) | INTRAMUSCULAR | Status: DC | PRN

## 2015-01-31 SURGICAL SUPPLY — 34 items
BIT DRILL CALIBRATED 4.2 (BIT) IMPLANT
BLADE SURG 15 STRL LF DISP TIS (BLADE) ×1 IMPLANT
BLADE SURG 15 STRL SS (BLADE) ×3
BLADE TFNA HELICAL 100 STRL (Anchor) ×2 IMPLANT
COVER PERINEAL POST (MISCELLANEOUS) ×3 IMPLANT
COVER SURGICAL LIGHT HANDLE (MISCELLANEOUS) ×6 IMPLANT
COVER TABLE BACK 60X90 (DRAPES) ×3 IMPLANT
DRAPE C-ARM 42X72 X-RAY (DRAPES) ×1 IMPLANT
DRAPE STERI IOBAN 125X83 (DRAPES) ×3 IMPLANT
DRILL BIT CALIBRATED 4.2 (BIT) ×3
DRSG ADAPTIC 3X8 NADH LF (GAUZE/BANDAGES/DRESSINGS) ×3 IMPLANT
DRSG MEPILEX BORDER 4X4 (GAUZE/BANDAGES/DRESSINGS) ×1 IMPLANT
DRSG MEPILEX BORDER 4X8 (GAUZE/BANDAGES/DRESSINGS) ×5 IMPLANT
ELECT REM PT RETURN 9FT ADLT (ELECTROSURGICAL) ×3
ELECTRODE REM PT RTRN 9FT ADLT (ELECTROSURGICAL) ×1 IMPLANT
EVACUATOR 1/8 PVC DRAIN (DRAIN) IMPLANT
GLOVE BIOGEL PI IND STRL 9 (GLOVE) ×1 IMPLANT
GLOVE BIOGEL PI INDICATOR 9 (GLOVE) ×2
GLOVE SURG ORTHO 9.0 STRL STRW (GLOVE) ×3 IMPLANT
GOWN STRL REUS W/ TWL XL LVL3 (GOWN DISPOSABLE) ×3 IMPLANT
GOWN STRL REUS W/TWL XL LVL3 (GOWN DISPOSABLE) ×9
GUIDEWIRE 3.2X400 (WIRE) ×4 IMPLANT
KIT BASIN OR (CUSTOM PROCEDURE TRAY) ×3 IMPLANT
KIT ROOM TURNOVER OR (KITS) ×3 IMPLANT
LINER BOOT UNIVERSAL DISP (MISCELLANEOUS) ×1 IMPLANT
MANIFOLD NEPTUNE II (INSTRUMENTS) ×1 IMPLANT
NAIL TROCH FIX 10X170 130 (Nail) ×2 IMPLANT
NS IRRIG 1000ML POUR BTL (IV SOLUTION) ×3 IMPLANT
PACK GENERAL/GYN (CUSTOM PROCEDURE TRAY) ×3 IMPLANT
PAD ARMBOARD 7.5X6 YLW CONV (MISCELLANEOUS) ×6 IMPLANT
SCREW LOCKING 5.0X34MM (Screw) ×2 IMPLANT
STAPLER VISISTAT 35W (STAPLE) IMPLANT
SUT VIC AB 2-0 CTB1 (SUTURE) IMPLANT
WATER STERILE IRR 1000ML POUR (IV SOLUTION) ×6 IMPLANT

## 2015-01-31 NOTE — Progress Notes (Signed)
Patient Name: Brandon Sharp Date of Encounter: 01/31/2015   SUBJECTIVE  Pt is sleepy. No cp, sob or palpitation.   CURRENT MEDS . sodium chloride   Intravenous Once  . sodium chloride   Intravenous Once  . sodium chloride   Intravenous Once  . atorvastatin  40 mg Oral QPM  . azaTHIOprine  75 mg Oral q morning - 10a  . calcium acetate  667 mg Oral BID WC  . carvedilol  6.25 mg Oral QHS  . clindamycin (CLEOCIN) IV  900 mg Intravenous To SSTC  . colesevelam  1,875 mg Oral BID WC  . [START ON 02/01/2015] darbepoetin (ARANESP) injection - DIALYSIS  200 mcg Intravenous Q Wed-HD  . doxercalciferol  3 mcg Intravenous Q M,W,F-HD  . feeding supplement  1 Container Oral BID BM  . feeding supplement (PRO-STAT SUGAR FREE 64)  30 mL Oral Q1500  .  HYDROmorphone (DILAUDID) injection  1 mg Intramuscular Once  . Influenza vac split quadrivalent PF  0.5 mL Intramuscular Tomorrow-1000  . insulin aspart  0-9 Units Subcutaneous Q6H  . isosorbide dinitrate  40 mg Oral 3 times per day  . pantoprazole  40 mg Oral Daily  . predniSONE  5 mg Oral Q breakfast  . senna-docusate  3 tablet Oral BID  . [START ON 02/01/2015] Sirolimus  1.5 mg Oral q morning - 10a    OBJECTIVE  Filed Vitals:   01/30/15 1705 01/30/15 2006 01/31/15 0550 01/31/15 0926  BP: 136/44 152/64 123/94 136/62  Pulse: 95 96 124 103  Temp: 98.7 F (37.1 C) 98.2 F (36.8 C) 99.1 F (37.3 C) 100.4 F (38 C)  TempSrc: Oral   Oral  Resp: 15 18 18 18   Height:      Weight:      SpO2: 95% 100% 94% 97%    Intake/Output Summary (Last 24 hours) at 01/31/15 1140 Last data filed at 01/31/15 1035  Gross per 24 hour  Intake   1020 ml  Output    270 ml  Net    750 ml   Filed Weights   01/30/15 1245 01/30/15 1610  Weight: 156 lb 8.4 oz (71 kg) 153 lb 14.1 oz (69.8 kg)    PHYSICAL EXAM  General: Pleasant, NAD. Neuro: Alert and oriented X 3. Moves all extremities spontaneously. Psych: Normal affect. HEENT:  Normal  Neck: Supple  without bruits. +JVD. Lungs:  Resp regular and unlabored, CTA. Heart: RRR no s3, s4. 2/6 systolic murmurs. Abdomen: Soft, non-tender, non-distended, BS + x 4.  Extremities: No clubbing, cyanosis or edema. DP/PT/Radials 2+ and equal bilaterally.  Accessory Clinical Findings  CBC  Recent Labs  01/30/15 1144 01/30/15 2315 01/31/15 0522  WBC 3.0* 2.9* 3.3*  NEUTROABS 1.4* 1.7  --   HGB 6.9* 9.4* 9.7*  HCT 21.2* 28.9* 28.5*  MCV 88.7 88.7 87.2  PLT 34* 27* 27*   Basic Metabolic Panel  Recent Labs  01/30/15 1300 01/30/15 2315 01/31/15 0522  NA 136 136 138  K 3.6 4.6 5.0  CL 98* 98* 100*  CO2 30 30 28   GLUCOSE 113* 142* 106*  BUN 31* 15 19  CREATININE 6.24* 3.68* 4.12*  CALCIUM 8.3* 8.0* 8.3*  PHOS 3.0  --   --    Liver Function Tests  Recent Labs  01/30/15 1300 01/30/15 2315  AST  --  25  ALT  --  18  ALKPHOS  --  69  BILITOT  --  1.9*  PROT  --  5.1*  ALBUMIN 2.2* 2.3*     Radiology/Studies  Dg Chest 1 View  01/30/2015   CLINICAL DATA:  Patient fell this morning landing on the left side. Left hip pain and deformity.  EXAM: CHEST  1 VIEW  COMPARISON:  12/23/2014  FINDINGS: Postoperative changes in the mediastinum. Vascular grafts in the upper chest bilaterally. Calcified and tortuous aorta. Since the previous study, the central line has been removed. Shallow inspiration. Cardiac enlargement without significant vascular congestion or edema. No focal consolidation. No blunting of costophrenic angles.  IMPRESSION: Cardiac enlargement.  No evidence of active pulmonary disease.   Electronically Signed   By: Lucienne Capers M.D.   On: 01/30/2015 04:14   Dg Tibia/fibula Left  01/30/2015   CLINICAL DATA:  Patient fell this morning, landing on the left side. Pain.  EXAM: LEFT TIBIA AND FIBULA - 2 VIEW  COMPARISON:  None.  FINDINGS: Diffuse bone demineralization. No evidence of acute fracture or dislocation. No focal bone lesion or bone destruction. Extensive vascular  calcifications. Surgical clips in the soft tissues. Old periosteal reaction in the fibular shaft.  IMPRESSION: Diffuse bone demineralization.  No acute bony abnormalities.   Electronically Signed   By: Lucienne Capers M.D.   On: 01/30/2015 04:48   Dg Hand Complete Left  01/30/2015   CLINICAL DATA:  Patient fell this morning, landing on the left. Pain fifth metacarpal area.  EXAM: LEFT HAND - COMPLETE 3+ VIEW  COMPARISON:  None.  FINDINGS: Diffuse bone demineralization. Diffuse degenerative changes in the interphalangeal joints, first carpometacarpal and metacarpal phalangeal joints, and in the intercarpal joints. No acute fracture or dislocation demonstrated. Soft tissue swelling about the medial aspect of the left hand. Vascular calcifications.  IMPRESSION: Diffuse bone demineralization and degenerative changes. No acute bony abnormalities.   Electronically Signed   By: Lucienne Capers M.D.   On: 01/30/2015 04:10   Dg Hip Unilat With Pelvis 2-3 Views Left  01/30/2015   CLINICAL DATA:  Patient fell onto the left side this morning. Left hip pain and deformity.  EXAM: DG HIP (WITH OR WITHOUT PELVIS) 2-3V LEFT  COMPARISON:  None.  FINDINGS: Acute comminuted inter trochanteric fracture of the left hip with impaction and valgus orientation of the fracture fragments. Mildly displaced lesser trochanteric fragment. Prominent degenerative changes in both hips. No evidence of dislocation of the hip. The visualized pelvis appears intact. SI joints and symphysis pubis are not displaced. Extensive vascular calcifications.  IMPRESSION: Acute comminuted inter trochanteric fracture of the left proximal femur with impaction and valgus orientation of the fracture fragments.   Electronically Signed   By: Lucienne Capers M.D.   On: 01/30/2015 04:15    ASSESSMENT AND PLAN Principal Problem:   Closed left hip fracture Active Problems:   Heart transplanted   S/P CABG (coronary artery bypass graft)   DM (diabetes mellitus),  type 2 with renal complications   ESRD on hemodialysis   Immunosuppressed status   Kidney transplant failure   Protein-calorie malnutrition, severe   Thrombocytopenia   Anemia due to chronic kidney disease   Essential hypertension   Chronic anemia   Chronic renal failure    Plan: No chest pain. Echo pending. He is at moderate to high perioperative risk given complex comorbidities. Coumadin on hold for surgery. Low platelets (27), follow closely. HD tomorrow (MWF). BP stable.   Signed, Bhagat,Bhavinkumar PA-C   As above, patient seen and examined. He denies chest pain or dyspnea. No plans for further ischemia evaluation  preoperatively. Patient is significantly thrombocytopenic. Question need for platelet transfusion prior to hip surgery. I will leave this to primary service and orthopedics. We will follow postoperatively. Continue statin for CAD; no aspirin give thrombocytopenia and need for coumadin.  Kirk Ruths

## 2015-01-31 NOTE — Progress Notes (Addendum)
[facility] PROGRESS NOTE  AMONI MORALES LDJ:[id number] DOB: 1945-11-22 DOA: 01/30/2015 PCP: [name], MD   Off Service Summary: Presents with closed L hip fx after mechanical fall. Has hx significant for heart transplant in 2001 and failed renal transplant, followed at [facility]. Pt underwent surgery on 9/6. Has hx of multiple DVT with plans to resume anticoagulation when OK with surgery.  Assessment/Plan:  1. Closed left hip fracture The patient is s/p mechanical fall without loc Patient is found to have left hip intertrochanteric fracture with impaction. Dr. Sharol Given following and pt underwent surgery on 9/6 Pain management with morphine and oxycodone, additional management per hip fracture protocol.  2.preoperative evaluation. A) Cardiac risk: Based on RCRI  >History of ischemic heart disease with cardiac transplant history in 2001 >Diabetes mellitus requiring treatment with insulin >Preoperative serum creatinine >2.0 mg/dL  2D echocardiogram was ordered here, pending Last documented echo from 2015 at [facility] with EF 60-65%, mild AR Pt is s/p cath in 09/2013 at [facility] with mod non-obstructive disease  B) Pulmonary risk: Recommend incentive spirometry. Cont good pulmunary toilet.  C) bleeding risk With chronic anemia as well as thrombocytopenia the patient is at high risk for significant bleeding, on top of that the patient requires therapeutic anticoagulation for his DVT. Heparin per pharmacy protocol without bolus after the INR is less than 2.  3. ESRD on hemodialysis. S/p failed renal transplant Cont with HD as per Nephrology Remains stable thus far  4. Diabetes mellitus. Continuing sliding scale insulin.  5. Chronic anemia and chronic thrombocytopenia. Significant bleeding risk as mentioned above. Patient recently received 2 units of PRBC. Receiving supplements EPO with hemodialysis. Continue iron supplimentation. Given platelet tx  today  6.Constipation. Improved With Senokot regimen which the patient is continuing.  7.recent groin infection. Patient completed a course of vancomycin.  8. Cardiac and renal transplant Continued on immunosuppressive medication  Code Status: DNR Family Communication: Pt in room Disposition Plan: Pending   Consultants:  Orthopedic Surgery  Procedures:    Antibiotics:    HPI/Subjective: Pt complains of L sided hip pain  Objective: Filed Vitals:   01/31/15 1754 01/31/15 1755 01/31/15 1756 01/31/15 1815  BP:    124/60  Pulse: 99 98 98 100  Temp:    97.5 F (36.4 C)  TempSrc:    Oral  Resp: 18 15 14 18   Height:      Weight:      SpO2: 96% 96% 97% 100%    Intake/Output Summary (Last 24 hours) at 01/31/15 1943 Last data filed at 01/31/15 1815  Gross per 24 hour  Intake    997 ml  Output     50 ml  Net    947 ml   Filed Weights   01/30/15 1245 01/30/15 1610  Weight: 71 kg (156 lb 8.4 oz) 69.8 kg (153 lb 14.1 oz)    Exam:   General:  Awake, laying in bed, in nad  Cardiovascular: regular, s1, s2  Respiratory: normal resp effort,no wheezing  Abdomen: soft,nondistended  Musculoskeletal: perfused, no clubbing   Data Reviewed: Basic Metabolic Panel:  Recent Labs Lab 01/27/15 1220 01/30/15 0217 01/30/15 1300 01/30/15 2315 01/31/15 0522 01/31/15 1550  NA 135 135 136 136 138 137  K 3.2* 3.8 3.6 4.6 5.0 4.5  CL 96* 97* 98* 98* 100*  --   CO2 29 29 30 30 28   --   GLUCOSE 81 131* 113* 142* 106* 106*  BUN 27* 29* 31* 15 19  --  CREATININE 6.18* 5.64* 6.24* 3.68* 4.12*  --   CALCIUM 8.5* 8.4* 8.3* 8.0* 8.3*  --   PHOS  --   --  3.0  --   --   --    Liver Function Tests:  Recent Labs Lab 01/30/15 1300 01/30/15 2315  AST  --  25  ALT  --  18  ALKPHOS  --  69  BILITOT  --  1.9*  PROT  --  5.1*  ALBUMIN 2.2* 2.3*   No results for input(s): LIPASE, AMYLASE in the last 168 hours. No results for input(s): AMMONIA in the last 168  hours. CBC:  Recent Labs Lab 01/30/15 0217 01/30/15 1144 01/30/15 2315 01/31/15 0522 01/31/15 1535 01/31/15 1550  WBC 3.8* 3.0* 2.9* 3.3* 2.7*  --   NEUTROABS 2.1 1.4* 1.7  --   --   --   HGB 7.4* 6.9* 9.4* 9.7* 8.3* 8.8*  HCT 22.8* 21.2* 28.9* 28.5* 25.5* 26.0*  MCV 90.1 88.7 88.7 87.2 89.2  --   PLT 56* 34* 27* 27* 28*  --    Cardiac Enzymes: No results for input(s): CKTOTAL, CKMB, CKMBINDEX, TROPONINI in the last 168 hours. BNP (last 3 results) No results for input(s): BNP in the last 8760 hours.  ProBNP (last 3 results)  Recent Labs  03/27/14 1747 04/12/14 0105  PROBNP 37853.0* 45045.0*    CBG:  Recent Labs Lab 01/31/15 0548 01/31/15 1141 01/31/15 1429 01/31/15 1636 01/31/15 1807  GLUCAP 100* 97 107* 110* 116*    Recent Results (from the past 240 hour(s))  MRSA PCR Screening     Status: None   Collection Time: 01/30/15  8:45 AM  Result Value Ref Range Status   MRSA by PCR NEGATIVE NEGATIVE Final    Comment:        The GeneXpert MRSA Assay (FDA approved for NASAL specimens only), is one component of a comprehensive MRSA colonization surveillance program. It is not intended to diagnose MRSA infection nor to guide or monitor treatment for MRSA infections.      Studies: Dg Chest 1 View  01/30/2015   CLINICAL DATA:  Patient fell this morning landing on the left side. Left hip pain and deformity.  EXAM: CHEST  1 VIEW  COMPARISON:  12/23/2014  FINDINGS: Postoperative changes in the mediastinum. Vascular grafts in the upper chest bilaterally. Calcified and tortuous aorta. Since the previous study, the central line has been removed. Shallow inspiration. Cardiac enlargement without significant vascular congestion or edema. No focal consolidation. No blunting of costophrenic angles.  IMPRESSION: Cardiac enlargement.  No evidence of active pulmonary disease.   Electronically Signed   By: Lucienne Capers M.D.   On: 01/30/2015 04:14   Dg Tibia/fibula  Left  01/30/2015   CLINICAL DATA:  Patient fell this morning, landing on the left side. Pain.  EXAM: LEFT TIBIA AND FIBULA - 2 VIEW  COMPARISON:  None.  FINDINGS: Diffuse bone demineralization. No evidence of acute fracture or dislocation. No focal bone lesion or bone destruction. Extensive vascular calcifications. Surgical clips in the soft tissues. Old periosteal reaction in the fibular shaft.  IMPRESSION: Diffuse bone demineralization.  No acute bony abnormalities.   Electronically Signed   By: Lucienne Capers M.D.   On: 01/30/2015 04:48   Dg Hand Complete Left  01/30/2015   CLINICAL DATA:  Patient fell this morning, landing on the left. Pain fifth metacarpal area.  EXAM: LEFT HAND - COMPLETE 3+ VIEW  COMPARISON:  None.  FINDINGS: Diffuse bone  demineralization. Diffuse degenerative changes in the interphalangeal joints, first carpometacarpal and metacarpal phalangeal joints, and in the intercarpal joints. No acute fracture or dislocation demonstrated. Soft tissue swelling about the medial aspect of the left hand. Vascular calcifications.  IMPRESSION: Diffuse bone demineralization and degenerative changes. No acute bony abnormalities.   Electronically Signed   By: Lucienne Capers M.D.   On: 01/30/2015 04:10   Dg Hip Operative Unilat With Pelvis Left  01/31/2015   CLINICAL DATA:  Status post ORIF of the left hip foreign intertrochanteric fracture  EXAM: OPERATIVE left HIP (WITH PELVIS IF PERFORMED) single AP portable VIEWS  TECHNIQUE: Fluoroscopic spot image(s) were submitted for interpretation post-operatively.  COMPARISON:  Preoperative study of January 30, 2015  FINDINGS: Reported fluoro time is 10 seconds. The single spot film reveals interval placement of a telescoping nail as well as intra medullary rod. The alignment of the fracture fragments is anatomic.  IMPRESSION: There is no immediate postprocedure complication following ORIF of the left hip.   Electronically Signed   By: David  Martinique M.D.   On:  01/31/2015 16:18   Dg Hip Unilat With Pelvis 2-3 Views Left  01/30/2015   CLINICAL DATA:  Patient fell onto the left side this morning. Left hip pain and deformity.  EXAM: DG HIP (WITH OR WITHOUT PELVIS) 2-3V LEFT  COMPARISON:  None.  FINDINGS: Acute comminuted inter trochanteric fracture of the left hip with impaction and valgus orientation of the fracture fragments. Mildly displaced lesser trochanteric fragment. Prominent degenerative changes in both hips. No evidence of dislocation of the hip. The visualized pelvis appears intact. SI joints and symphysis pubis are not displaced. Extensive vascular calcifications.  IMPRESSION: Acute comminuted inter trochanteric fracture of the left proximal femur with impaction and valgus orientation of the fracture fragments.   Electronically Signed   By: Lucienne Capers M.D.   On: 01/30/2015 04:15    Scheduled Meds: . sodium chloride   Intravenous Once  . sodium chloride   Intravenous Once  . antiseptic oral rinse  7 mL Mouth Rinse BID  . atorvastatin  40 mg Oral QPM  . azaTHIOprine  75 mg Oral q morning - 10a  . calcium acetate  667 mg Oral BID WC  . carvedilol  6.25 mg Oral QHS  . clindamycin (CLEOCIN) IV  600 mg Intravenous Q6H  . colesevelam  1,875 mg Oral BID WC  . [START ON 02/01/2015] darbepoetin (ARANESP) injection - DIALYSIS  200 mcg Intravenous Q Wed-HD  . doxercalciferol  3 mcg Intravenous Q M,W,F-HD  . feeding supplement  1 Container Oral BID BM  . feeding supplement (PRO-STAT SUGAR FREE 64)  30 mL Oral Q1500  .  HYDROmorphone (DILAUDID) injection  1 mg Intramuscular Once  . Influenza vac split quadrivalent PF  0.5 mL Intramuscular Tomorrow-1000  . insulin aspart  0-9 Units Subcutaneous Q6H  . isosorbide dinitrate  40 mg Oral 3 times per day  . pantoprazole  40 mg Oral Daily  . predniSONE  5 mg Oral Q breakfast  . senna-docusate  3 tablet Oral BID  . [START ON 02/01/2015] Sirolimus  1.5 mg Oral q morning - 10a   Continuous Infusions: .  sodium chloride 10 mL/hr at 01/31/15 1432  . sodium chloride 10 mL/hr at 01/31/15 1815  . [START ON 02/01/2015] heparin      Principal Problem:   Closed left hip fracture Active Problems:   Heart transplanted   S/P CABG (coronary artery bypass graft)   DM (diabetes  mellitus), type 2 with renal complications   ESRD on hemodialysis   Immunosuppressed status   Kidney transplant failure   Protein-calorie malnutrition, severe   Thrombocytopenia   Anemia due to chronic kidney disease   Essential hypertension   Chronic anemia   Chronic renal failure   Malnutrition of moderate degree   Richardson Dubree K  [facility] Pager [phone]. If 7PM-7AM, please contact night-coverage at [email or web address], password [facility] 01/31/2015, 7:43 PM  LOS: 1 day

## 2015-01-31 NOTE — Anesthesia Preprocedure Evaluation (Addendum)
Anesthesia Evaluation  Patient identified by MRN, date of birth, ID band Patient awake    Reviewed: Allergy & Precautions, NPO status , Patient's Chart, lab work & pertinent test results, reviewed documented beta blocker date and time   History of Anesthesia Complications Negative for: history of anesthetic complications  Airway Mallampati: II  TM Distance: >3 FB Neck ROM: Full    Dental  (+) Teeth Intact, Dental Advidsory Given   Pulmonary former smoker (quit 1990),  breath sounds clear to auscultation        Cardiovascular hypertension, Pt. on medications and Pt. on home beta blockers + CAD, + Past MI (1990), + Cardiac Stents, + Peripheral Vascular Disease, +CHF and DVT + dysrhythmias Ventricular Fibrillation Rhythm:Regular  Heart transplant 2001: EF 50-55% 2015 cath: 40% distal left main, mild atherosclerosis within the LAD and ramus, 50% proximal circumflex, and 50% proximal RCA (in-stent). LVEF was 50-55%   Neuro/Psych negative neurological ROS  negative psych ROS   GI/Hepatic negative GI ROS, Neg liver ROS, GERD-  Medicated and Controlled,  Endo/Other  negative endocrine ROSdiabetes, Type 2, Oral Hypoglycemic Agents  Renal/GU ESRF and DialysisRenal disease (K+ 5.0)negative Renal ROS  negative genitourinary   Musculoskeletal negative musculoskeletal ROS (+)   Abdominal   Peds  Hematology negative hematology ROS (+) Blood dyscrasia (coumadin INR 1.57, Hb 9.7, plt 27K), anemia ,   Anesthesia Other Findings   Reproductive/Obstetrics                         Anesthesia Physical  Anesthesia Plan  ASA: III  Anesthesia Plan: General   Post-op Pain Management:    Induction: Intravenous  Airway Management Planned: Oral ETT  Additional Equipment: None  Intra-op Plan:   Post-operative Plan: Extubation in OR  Informed Consent: I have reviewed the patients History and Physical, chart, labs  and discussed the procedure including the risks, benefits and alternatives for the proposed anesthesia with the patient or authorized representative who has indicated his/her understanding and acceptance.   Dental advisory given and Dental Advisory Given  Plan Discussed with: CRNA  Anesthesia Plan Comments:        Anesthesia Quick Evaluation                                  Anesthesia Evaluation  Patient identified by MRN, date of birth, ID band Patient awake    Reviewed: Allergy & Precautions, H&P , NPO status , Patient's Chart, lab work & pertinent test results  Airway Mallampati: II TM Distance: >3 FB Neck ROM: Full    Dental  (+) Caps, Dental Advisory Given   Pulmonary pneumonia -, former smoker,  breath sounds clear to auscultation        Cardiovascular hypertension, Pt. on home beta blockers and Pt. on medications + angina + CAD, + Past MI, + Cardiac Stents, + Peripheral Vascular Disease and +CHF + dysrhythmias Ventricular Fibrillation Rhythm:Regular Rate:Normal  Cardiac transplant   Neuro/Psych negative neurological ROS  negative psych ROS   GI/Hepatic negative GI ROS, Neg liver ROS,   Endo/Other  diabetes, Type 1, Insulin Dependent  Renal/GU Dialysis and CRFRenal disease  negative genitourinary   Musculoskeletal negative musculoskeletal ROS (+)   Abdominal   Peds negative pediatric ROS (+)  Hematology negative hematology ROS (+) anemia ,   Anesthesia Other Findings   Reproductive/Obstetrics  Anesthesia Physical Anesthesia Plan  ASA: IV  Anesthesia Plan: MAC   Post-op Pain Management:    Induction: Intravenous  Airway Management Planned: Simple Face Mask  Additional Equipment:   Intra-op Plan:   Post-operative Plan: Extubation in OR  Informed Consent: I have reviewed the patients History and Physical, chart, labs and discussed the procedure including the risks, benefits and  alternatives for the proposed anesthesia with the patient or authorized representative who has indicated his/her understanding and acceptance.   Dental advisory given  Plan Discussed with: CRNA  Anesthesia Plan Comments:         Anesthesia Quick Evaluation

## 2015-01-31 NOTE — H&P (View-Only) (Signed)
Patient ID: Brandon Sharp, male   DOB: 08-Mar-1946, 69 y.o.   MRN: [id number] The operating room is on a holiday schedule today and will not be able to accommodate surgery today. Plan for surgery tomorrow after 4 PM

## 2015-01-31 NOTE — Progress Notes (Signed)
CRITICAL VALUE ALERT  Critical value received:  Platelets 28  Date of notification:  01/31/2015  Time of notification:  0012  Critical value read back:Yes.    Nurse who received alert:  Arlyss Queen  MD notified (1st page):  Lamar Blinks NP  Time of first page:  [phone]

## 2015-01-31 NOTE — Progress Notes (Signed)
[facility] Progress Note  Assessment/Plan: 1. Left hip fx - ? For surgery today  -platelets low 2. ESRD -MWF- HD tomorrow  - no heparin 3. Anemia - recent transfusion; max ESA; Hgb 9.7, transfsue prn 4. Secondary hyperparathyroidism - hectorol  5. HTN/volume - UF only 270 Monday - post bed weight was 69.8 6. Nutrition - npo for surg/ on supplements and prostat when eating 7. DM 8. 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 Heart Tx 2001- current meds 10. Thrombocytopenia - platelets low down to 20s this am  Myriam Jacobson, PA-C [place] [phone] 01/31/2015,10:51 AM  LOS: 1 day   Pt seen, examined and agree w A/P as above.  Kelly Splinter MD pager [phone]    cell [phone] 01/31/2015, 4:25 PM    Subjective:     Objective Filed Vitals:   01/30/15 1705 01/30/15 2006 01/31/15 0550 01/31/15 0926  BP: 136/44 152/64 123/94 136/62  Pulse: 95 96 124 103  Temp: 98.7 F (37.1 C) 98.2 F (36.8 C) 99.1 F (37.3 C) 100.4 F (38 C)  TempSrc: Oral   Oral  Resp: 15 18 18 18   Height:      Weight:      SpO2: 95% 100% 94% 97%   Physical Exam General:uncomfortable Heart: tachy reg Lungs: grossly clear anteriorly Abdomen: soft NT Extremities: tr LE edema; bilateral UE edema chronic Dialysis Access:  Right thigh AVGG + bruit  Dialysis Orders: 4h 68kg 2/2 bath Heparin none R thigh AVG Aranesp 200/ wed Hect 3 ug tiw   Additional Objective Labs: Lab Results  Component Value Date   INR 1.57* 01/30/2015   INR 2.21* 01/30/2015   INR 2.11* 87/86/7672    Basic Metabolic Panel:  Recent Labs Lab 01/30/15 1300 01/30/15 2315 01/31/15 0522  NA 136 136 138  K 3.6 4.6 5.0  CL 98* 98* 100*  CO2 30 30 28   GLUCOSE 113* 142* 106*  BUN 31* 15 19  CREATININE 6.24* 3.68* 4.12*  CALCIUM 8.3* 8.0* 8.3*  PHOS 3.0  --   --    Liver Function Tests:  Recent Labs Lab 01/30/15 1300 01/30/15 2315  AST  --  25  ALT  --  18  ALKPHOS  --   69  BILITOT  --  1.9*  PROT  --  5.1*  ALBUMIN 2.2* 2.3*  CBC:  Recent Labs Lab 01/27/15 1220 01/30/15 0217 01/30/15 1144 01/30/15 2315 01/31/15 0522  WBC 2.9* 3.8* 3.0* 2.9* 3.3*  NEUTROABS  --  2.1 1.4* 1.7  --   HGB 6.1* 7.4* 6.9* 9.4* 9.7*  HCT 19.0* 22.8* 21.2* 28.9* 28.5*  MCV 88.8 90.1 88.7 88.7 87.2  PLT 44* 56* 34* 27* 27*   Blood Culture    Component Value Date/Time   SDES CATH TIP 12/28/2014 1535   SPECREQUEST LEFT IJ 12/28/2014 1535   CULT  12/28/2014 1535    NO GROWTH 2 DAYS Performed at [facility] 12/31/2014 FINAL 12/28/2014 1535    Cardiac Enzymes: No results for input(s): CKTOTAL, CKMB, CKMBINDEX, TROPONINI in the last 168 hours. CBG:  Recent Labs Lab 01/30/15 0650 01/30/15 1146 01/30/15 1721 01/30/15 2328 01/31/15 0548  GLUCAP 120* 100* 84 128* 100*   Iron Studies: No results for input(s): IRON, TIBC, TRANSFERRIN, FERRITIN in the last 72 hours. @lablastinr3 @ Studies/Results: Dg Chest 1 View  01/30/2015   CLINICAL DATA:  Patient fell this morning landing on the left side. Left hip pain  and deformity.  EXAM: CHEST  1 VIEW  COMPARISON:  12/23/2014  FINDINGS: Postoperative changes in the mediastinum. Vascular grafts in the upper chest bilaterally. Calcified and tortuous aorta. Since the previous study, the central line has been removed. Shallow inspiration. Cardiac enlargement without significant vascular congestion or edema. No focal consolidation. No blunting of costophrenic angles.  IMPRESSION: Cardiac enlargement.  No evidence of active pulmonary disease.   Electronically Signed   By: Lucienne Capers M.D.   On: 01/30/2015 04:14   Dg Tibia/fibula Left  01/30/2015   CLINICAL DATA:  Patient fell this morning, landing on the left side. Pain.  EXAM: LEFT TIBIA AND FIBULA - 2 VIEW  COMPARISON:  None.  FINDINGS: Diffuse bone demineralization. No evidence of acute fracture or dislocation. No focal bone lesion or bone destruction.  Extensive vascular calcifications. Surgical clips in the soft tissues. Old periosteal reaction in the fibular shaft.  IMPRESSION: Diffuse bone demineralization.  No acute bony abnormalities.   Electronically Signed   By: Lucienne Capers M.D.   On: 01/30/2015 04:48   Dg Hand Complete Left  01/30/2015   CLINICAL DATA:  Patient fell this morning, landing on the left. Pain fifth metacarpal area.  EXAM: LEFT HAND - COMPLETE 3+ VIEW  COMPARISON:  None.  FINDINGS: Diffuse bone demineralization. Diffuse degenerative changes in the interphalangeal joints, first carpometacarpal and metacarpal phalangeal joints, and in the intercarpal joints. No acute fracture or dislocation demonstrated. Soft tissue swelling about the medial aspect of the left hand. Vascular calcifications.  IMPRESSION: Diffuse bone demineralization and degenerative changes. No acute bony abnormalities.   Electronically Signed   By: Lucienne Capers M.D.   On: 01/30/2015 04:10   Dg Hip Unilat With Pelvis 2-3 Views Left  01/30/2015   CLINICAL DATA:  Patient fell onto the left side this morning. Left hip pain and deformity.  EXAM: DG HIP (WITH OR WITHOUT PELVIS) 2-3V LEFT  COMPARISON:  None.  FINDINGS: Acute comminuted inter trochanteric fracture of the left hip with impaction and valgus orientation of the fracture fragments. Mildly displaced lesser trochanteric fragment. Prominent degenerative changes in both hips. No evidence of dislocation of the hip. The visualized pelvis appears intact. SI joints and symphysis pubis are not displaced. Extensive vascular calcifications.  IMPRESSION: Acute comminuted inter trochanteric fracture of the left proximal femur with impaction and valgus orientation of the fracture fragments.   Electronically Signed   By: Lucienne Capers M.D.   On: 01/30/2015 04:15   Medications: . sodium chloride 10 mL/hr at 01/31/15 1042   . sodium chloride   Intravenous Once  . sodium chloride   Intravenous Once  . sodium chloride    Intravenous Once  . atorvastatin  40 mg Oral QPM  . azaTHIOprine  75 mg Oral q morning - 10a  . calcium acetate  667 mg Oral BID WC  . carvedilol  6.25 mg Oral QHS  . clindamycin (CLEOCIN) IV  900 mg Intravenous To SSTC  . colesevelam  1,875 mg Oral BID WC  . [START ON 02/01/2015] darbepoetin (ARANESP) injection - DIALYSIS  200 mcg Intravenous Q Wed-HD  . doxercalciferol  3 mcg Intravenous Q M,W,F-HD  . feeding supplement  1 Container Oral BID BM  . feeding supplement (PRO-STAT SUGAR FREE 64)  30 mL Oral Q1500  .  HYDROmorphone (DILAUDID) injection  1 mg Intramuscular Once  . Influenza vac split quadrivalent PF  0.5 mL Intramuscular Tomorrow-1000  . insulin aspart  0-9 Units Subcutaneous Q6H  .  isosorbide dinitrate  40 mg Oral 3 times per day  . pantoprazole  40 mg Oral Daily  . predniSONE  5 mg Oral Q breakfast  . senna-docusate  3 tablet Oral BID  . Sirolimus  1.5 mg Oral q morning - 10a

## 2015-01-31 NOTE — Progress Notes (Signed)
ANTICOAGULATION CONSULT NOTE - Follow Up Consult  Pharmacy Consult for Heparin (when INR<2) Indication: Hx DVT  Allergies  Allergen Reactions  . Lisinopril Swelling    Lips and tongue swell  . Niacin And Related Other (See Comments)    unknown  . Norvasc [Amlodipine Besylate] Rash    Flushing  . Penicillins Rash    Patient Measurements: Height: 6' (182.9 cm) Weight:  (pt refused to remove transfering linen to get weight.) IBW/kg (Calculated) : 77.6 Heparin Dosing Weight: 70 kg  Vital Signs: Temp: 100.4 F (38 C) (09/06 0926) Temp Source: Oral (09/06 0926) BP: 136/62 mmHg (09/06 0926) Pulse Rate: 103 (09/06 0926)  Labs:  Recent Labs  01/30/15 0217 01/30/15 1144 01/30/15 1300 01/30/15 2315 01/31/15 0522 01/31/15 0723  HGB 7.4* 6.9*  --  9.4* 9.7*  --   HCT 22.8* 21.2*  --  28.9* 28.5*  --   PLT 56* 34*  --  27* 27*  --   LABPROT 23.5* 24.3*  --  18.8*  --   --   INR 2.11* 2.21*  --  1.57*  --   --   HEPARINUNFRC  --   --   --   --   --  0.15*  CREATININE 5.64*  --  6.24* 3.68* 4.12*  --     Estimated Creatinine Clearance: 16.5 mL/min (by C-G formula based on Cr of 4.12).   Medications:  Heparin @ 1000 units/hr  Assessment: 69 YOM on warfarin PTA for hx DVT - held on admit for mechanical fall with L-intertrochanteric hip fracture - going to the OR on 9/6 for repair. Pharmacy was consulted to start heparin when INR<2.  The patient's heparin level this morning is slightly SUBtherapeutic (HL 0.15, goal of 0.3-0.7) however was drawn ~2 hours early. Plans are for the patient to go to the OR later this afternoon for repair.   The patient is noted to have severe thrombocytopenia (plts 27) - the use of IV heparin in this patient was discussed with the MD. A lot of sources cite holding anticoagulation when plts are <30 unless the patient is deemed high risk. The patient's last known clots were in March (~6 months ago) however Dr. Wyline Copas still desires to leave heparin on  as he considers the patient high risk for recurrent clots. He will transfuse platelets this morning in an attempt to get the plts >/= 30. The patient does have history of chronic thrombocytopenia however while admitted in July - his platelet range was higher and around 50-80.   **Update - Dr. Sharol Given desires to go ahead and hold the heparin this morning. He was also made aware of the low platelets and agrees with the platelet infusion. **  Goal of Therapy:  Heparin level 0.3-0.7 units/ml Monitor platelets by anticoagulation protocol: Yes   Plan:  1. Hold heparin per Dr. Sharol Given for surgery plans this afternoon 2. Will follow-up platelet count s/p transfusion, OR plans, and plans to resume heparin and/or warfarin as platelets recovery  Alycia Rossetti, PharmD, BCPS Clinical Pharmacist Pager: [phone] 01/31/2015 10:38 AM

## 2015-01-31 NOTE — Op Note (Signed)
01/30/2015 - 01/31/2015  4:11 PM  PATIENT:  Brandon Sharp    PRE-OPERATIVE DIAGNOSIS:  left intertroch fx  POST-OPERATIVE DIAGNOSIS:  Same  PROCEDURE:  INTRAMEDULLARY (IM) NAIL INTERTROCHANTRIC  SURGEON:  Levaughn Puccinelli V, MD  PHYSICIAN ASSISTANT:None ANESTHESIA:   General  PREOPERATIVE INDICATIONS:  [name] is a  69 y.o. male with a diagnosis of left intertroch fx who failed conservative measures and elected for surgical management.    The risks benefits and alternatives were discussed with the patient preoperatively including but not limited to the risks of infection, bleeding, nerve injury, cardiopulmonary complications, the need for revision surgery, among others, and the patient was willing to proceed.  OPERATIVE IMPLANTS: Synthes trochanteric nail 170 x 10 mm with a 100 mm spiral blade and 34 mm locking screw  OPERATIVE FINDINGS: Stable fixation  OPERATIVE PROCEDURE: Patient was brought to the operating room and underwent a general anesthesia. After adequate levels anesthesia obtained patient was placed on the [facility] fracture table the left lower extremity was placed in boot traction and the right lower extremity was placed in the dorsal lithotomy position care was taken to ensure that the Gore-Tex graft in the right lower extremity was not impacted by the position. Patient did have a palpable pulse after the leg was positioned. A timeout was called. The left lower extremity was prepped using DuraPrep draped into a sterile field with the shower curtain. Incision was made just proximal to the greater trochanter. Guidewire was inserted down the shaft this was over reamed proximally after checking with the C-arm in both AP and lateral planes. The nail was inserted guidewire was inserted just inferior to the center center of the femoral head. C-arm floss be verified alignment this was drilled the outer cortex and the spiral blade was inserted. C-arm floss be verified alignment. This  was locked proximally. The distal locking screw was placed at 34 mm. The wounds were irrigated normal saline. Hemostasis was obtained. The incision was closed using Vicryls staples and a Mepilex dressing. Patient once the implant was fixed was taken out of the dorsal lithotomy position taken out of traction and this was to minimize the time in a position which could compromise his graft. Patient was extubated taken to the PACU in stable condition.

## 2015-01-31 NOTE — Anesthesia Postprocedure Evaluation (Signed)
  Anesthesia Post-op Note  Patient: Brandon Sharp  Procedure(s) Performed: Procedure(s): INTRAMEDULLARY (IM) NAIL INTERTROCHANTRIC (Left)  Patient Location: PACU  Anesthesia Type:General  Level of Consciousness: oriented, sedated and patient cooperative  Airway and Oxygen Therapy: Patient Spontanous Breathing and Patient connected to nasal cannula oxygen  Post-op Pain: none  Post-op Assessment: Post-op Vital signs reviewed, Patient's Cardiovascular Status Stable, Respiratory Function Stable, Patent Airway, No signs of Nausea or vomiting and Pain level controlled              Post-op Vital Signs: Reviewed and stable  Last Vitals:  Filed Vitals:   01/31/15 1815  BP: 124/60  Pulse: 100  Temp: 36.4 C  Resp: 18    Complications: No apparent anesthesia complications

## 2015-01-31 NOTE — Progress Notes (Signed)
Spoke with Dr. Chiu>>referred to Dr. Sharol Given regarding when to resume IV heparin post-op. Dr Sharol Given was ok to resume heparin after hip surgery, but not necessarily now with plts 28. Hold off resuming heparin until AM. Re-evaluate platelets again 9/7.  Dashiel Bergquist S. Alford Highland, PharmD, [place] Clinical Staff Pharmacist Pager [phone]

## 2015-01-31 NOTE — Transfer of Care (Signed)
Immediate Anesthesia Transfer of Care Note  Patient: Brandon Sharp  Procedure(s) Performed: Procedure(s): INTRAMEDULLARY (IM) NAIL INTERTROCHANTRIC (Left)  Patient Location: PACU  Anesthesia Type:General  Level of Consciousness: awake, alert  and oriented  Airway & Oxygen Therapy: Patient Spontanous Breathing and Patient connected to nasal cannula oxygen  Post-op Assessment: Report given to RN, Post -op Vital signs reviewed and stable and Patient moving all extremities X 4  Post vital signs: Reviewed and stable  Last Vitals:  Filed Vitals:   01/31/15 1257  BP: 137/68  Pulse: 101  Temp: 36.8 C  Resp: 18    Complications: No apparent anesthesia complications

## 2015-01-31 NOTE — Interval H&P Note (Signed)
History and Physical Interval Note:  01/31/2015 6:04 AM  Saunders Revel  has presented today for surgery, with the diagnosis of left intertroch fx  The various methods of treatment have been discussed with the patient and family. After consideration of risks, benefits and other options for treatment, the patient has consented to  Procedure(s): INTRAMEDULLARY (IM) NAIL INTERTROCHANTRIC (Left) as a surgical intervention .  The patient's history has been reviewed, patient examined, no change in status, stable for surgery.  I have reviewed the patient's chart and labs.  Questions were answered to the patient's satisfaction.     DUDA,MARCUS V

## 2015-01-31 NOTE — Progress Notes (Signed)
[place] for Heparin when INR < 2 Indication: hx DVT  Allergies  Allergen Reactions  . Lisinopril Swelling    Lips and tongue swell  . Niacin And Related Other (See Comments)    unknown  . Norvasc [Amlodipine Besylate] Rash    Flushing  . Penicillins Rash    Patient Measurements: Height: 6' (182.9 cm) Weight: 153 lb 14.1 oz (69.8 kg) IBW/kg (Calculated) : 77.6 Heparin Dosing Weight: 71 kg  Vital Signs: Temp: 98.2 F (36.8 C) (09/05 2006) Temp Source: Oral (09/05 1705) BP: 152/64 mmHg (09/05 2006) Pulse Rate: 96 (09/05 2006)  Labs:  Recent Labs  01/30/15 0217 01/30/15 1144 01/30/15 1300 01/30/15 2315  HGB 7.4* 6.9*  --  9.4*  HCT 22.8* 21.2*  --  28.9*  PLT 56* 34*  --  27*  LABPROT 23.5* 24.3*  --  18.8*  INR 2.11* 2.21*  --  1.57*  CREATININE 5.64*  --  6.24*  --     Estimated Creatinine Clearance: 10.9 mL/min (by C-G formula based on Cr of 6.24).  Assessment: 69 y.o. male admitted with L hip fracture awiating surgery, h/o DVT Coumadin on hold, INR now < 2, for heparin bridge  Goal of Therapy:  Heparin level 0.3-0.7 units/ml Monitor platelets by anticoagulation protocol: Yes   Plan:   Start heparin 1000 units/hr Check heparin level in 8 hours.   Reida Hem, Bronson Curb 01/31/2015,12:15 AM

## 2015-01-31 NOTE — Progress Notes (Signed)
Utilization review completed. Kohlton Gilpatrick, RN, BSN. 

## 2015-02-01 ENCOUNTER — Ambulatory Visit (HOSPITAL_COMMUNITY): Payer: Medicare Other

## 2015-02-01 ENCOUNTER — Encounter (HOSPITAL_COMMUNITY): Payer: Self-pay | Admitting: Orthopedic Surgery

## 2015-02-01 DIAGNOSIS — E1121 Type 2 diabetes mellitus with diabetic nephropathy: Secondary | ICD-10-CM

## 2015-02-01 DIAGNOSIS — Z992 Dependence on renal dialysis: Secondary | ICD-10-CM

## 2015-02-01 DIAGNOSIS — Z941 Heart transplant status: Secondary | ICD-10-CM

## 2015-02-01 DIAGNOSIS — S72142A Displaced intertrochanteric fracture of left femur, initial encounter for closed fracture: Secondary | ICD-10-CM | POA: Insufficient documentation

## 2015-02-01 DIAGNOSIS — Z86718 Personal history of other venous thrombosis and embolism: Secondary | ICD-10-CM | POA: Insufficient documentation

## 2015-02-01 DIAGNOSIS — D631 Anemia in chronic kidney disease: Secondary | ICD-10-CM

## 2015-02-01 DIAGNOSIS — N189 Chronic kidney disease, unspecified: Secondary | ICD-10-CM

## 2015-02-01 DIAGNOSIS — N186 End stage renal disease: Secondary | ICD-10-CM

## 2015-02-01 DIAGNOSIS — D6959 Other secondary thrombocytopenia: Secondary | ICD-10-CM

## 2015-02-01 DIAGNOSIS — Z01810 Encounter for preprocedural cardiovascular examination: Secondary | ICD-10-CM

## 2015-02-01 DIAGNOSIS — E1129 Type 2 diabetes mellitus with other diabetic kidney complication: Secondary | ICD-10-CM

## 2015-02-01 LAB — PREPARE PLATELET PHERESIS
UNIT DIVISION: 0
UNIT DIVISION: 0
Unit division: 0

## 2015-02-01 LAB — CBC
HCT: 25.5 % — ABNORMAL LOW (ref 39.0–52.0)
HCT: 22 % — ABNORMAL LOW (ref 39.0–52.0)
HEMOGLOBIN: 8.3 g/dL — AB (ref 13.0–17.0)
Hemoglobin: 7.1 g/dL — ABNORMAL LOW (ref 13.0–17.0)
MCH: 29 pg (ref 26.0–34.0)
MCH: 29.2 pg (ref 26.0–34.0)
MCHC: 32.5 g/dL (ref 30.0–36.0)
MCHC: 32.3 g/dL (ref 30.0–36.0)
MCV: 89.2 fL (ref 78.0–100.0)
MCV: 90.5 fL (ref 78.0–100.0)
PLATELETS: 28 10*3/uL — AB (ref 150–400)
PLATELETS: 30 10*3/uL — AB (ref 150–400)
RBC: 2.86 MIL/uL — ABNORMAL LOW (ref 4.22–5.81)
RBC: 2.43 MIL/uL — ABNORMAL LOW (ref 4.22–5.81)
RDW: 15.6 % — AB (ref 11.5–15.5)
RDW: 15.7 % — AB (ref 11.5–15.5)
WBC: 2.7 10*3/uL — ABNORMAL LOW (ref 4.0–10.5)
WBC: 2.8 10*3/uL — AB (ref 4.0–10.5)

## 2015-02-01 LAB — CBC WITH DIFFERENTIAL/PLATELET
BASOS PCT: 0 % (ref 0–1)
Basophils Absolute: 0 10*3/uL (ref 0.0–0.1)
Basophils Absolute: 0 10*3/uL (ref 0.0–0.1)
Basophils Relative: 0 % (ref 0–1)
EOS ABS: 0 10*3/uL (ref 0.0–0.7)
EOS PCT: 1 % (ref 0–5)
Eosinophils Absolute: 0 10*3/uL (ref 0.0–0.7)
Eosinophils Relative: 1 % (ref 0–5)
HCT: 28.9 % — ABNORMAL LOW (ref 39.0–52.0)
HCT: 21.7 % — ABNORMAL LOW (ref 39.0–52.0)
Hemoglobin: 9.4 g/dL — ABNORMAL LOW (ref 13.0–17.0)
Hemoglobin: 7.1 g/dL — ABNORMAL LOW (ref 13.0–17.0)
LYMPHS ABS: 1 10*3/uL (ref 0.7–4.0)
Lymphocytes Relative: 35 % (ref 12–46)
Lymphocytes Relative: 41 % (ref 12–46)
Lymphs Abs: 0.9 10*3/uL (ref 0.7–4.0)
MCH: 28.8 pg (ref 26.0–34.0)
MCH: 29.7 pg (ref 26.0–34.0)
MCHC: 32.5 g/dL (ref 30.0–36.0)
MCHC: 32.7 g/dL (ref 30.0–36.0)
MCV: 88.7 fL (ref 78.0–100.0)
MCV: 90.8 fL (ref 78.0–100.0)
Monocytes Absolute: 0.2 10*3/uL (ref 0.1–1.0)
Monocytes Absolute: 0.1 10*3/uL (ref 0.1–1.0)
Monocytes Relative: 5 % (ref 3–12)
Monocytes Relative: 4 % (ref 3–12)
Neutro Abs: 1.7 10*3/uL (ref 1.7–7.7)
Neutro Abs: 1.2 10*3/uL — ABNORMAL LOW (ref 1.7–7.7)
Neutrophils Relative %: 59 % (ref 43–77)
Neutrophils Relative %: 55 % (ref 43–77)
PLATELETS: 27 10*3/uL — AB (ref 150–400)
Platelets: 27 10*3/uL — CL (ref 150–400)
RBC: 3.26 MIL/uL — AB (ref 4.22–5.81)
RBC: 2.39 MIL/uL — ABNORMAL LOW (ref 4.22–5.81)
RDW: 15.4 % (ref 11.5–15.5)
RDW: 15.8 % — ABNORMAL HIGH (ref 11.5–15.5)
WBC: 2.9 10*3/uL — AB (ref 4.0–10.5)
WBC: 2.3 10*3/uL — ABNORMAL LOW (ref 4.0–10.5)

## 2015-02-01 LAB — RENAL FUNCTION PANEL
Albumin: 1.9 g/dL — ABNORMAL LOW (ref 3.5–5.0)
Anion gap: 8 (ref 5–15)
BUN: 36 mg/dL — ABNORMAL HIGH (ref 6–20)
CO2: 30 mmol/L (ref 22–32)
Calcium: 8.2 mg/dL — ABNORMAL LOW (ref 8.9–10.3)
Chloride: 97 mmol/L — ABNORMAL LOW (ref 101–111)
Creatinine, Ser: 5.78 mg/dL — ABNORMAL HIGH (ref 0.61–1.24)
GFR calc Af Amer: 10 mL/min — ABNORMAL LOW (ref >60)
GFR calc non Af Amer: 9 mL/min — ABNORMAL LOW (ref >60)
Glucose, Bld: 102 mg/dL — ABNORMAL HIGH (ref 65–99)
Phosphorus: 5.3 mg/dL — ABNORMAL HIGH (ref 2.5–4.6)
Potassium: 4.7 mmol/L (ref 3.5–5.1)
Sodium: 135 mmol/L (ref 135–145)

## 2015-02-01 LAB — PREPARE RBC (CROSSMATCH)

## 2015-02-01 LAB — GLUCOSE, CAPILLARY
GLUCOSE-CAPILLARY: 92 mg/dL (ref 65–99)
Glucose-Capillary: 107 mg/dL — ABNORMAL HIGH (ref 65–99)
Glucose-Capillary: 74 mg/dL (ref 65–99)
Glucose-Capillary: 81 mg/dL (ref 65–99)

## 2015-02-01 MED ORDER — ASPIRIN EC 81 MG PO TBEC: 81.0000 mg | DELAYED_RELEASE_TABLET | Freq: Every day | ORAL | Status: AC

## 2015-02-01 MED ORDER — DARBEPOETIN ALFA 200 MCG/0.4ML IJ SOSY
PREFILLED_SYRINGE | INTRAMUSCULAR | Status: AC
  Administered 2015-02-01: 200 ug via INTRAVENOUS
  Filled 2015-02-01: qty 0.4

## 2015-02-01 MED ORDER — SODIUM CHLORIDE 0.9 % IV SOLN: 100.0000 mL | INTRAVENOUS | Status: DC | PRN

## 2015-02-01 MED ORDER — SODIUM CHLORIDE 0.9 % IV SOLN
100.0000 mL | INTRAVENOUS | Status: DC | PRN
Start: 2015-02-01 — End: 2015-02-02

## 2015-02-01 MED ORDER — ACETAMINOPHEN 500 MG PO TABS: 500.0000 mg | ORAL_TABLET | Freq: Four times a day (QID) | ORAL | Status: AC | PRN

## 2015-02-01 MED ORDER — HEPARIN SODIUM (PORCINE) 1000 UNIT/ML DIALYSIS
1000.0000 [IU] | INTRAMUSCULAR | Status: DC | PRN
Start: 2015-02-01 — End: 2015-02-01

## 2015-02-01 MED ORDER — DOXERCALCIFEROL 4 MCG/2ML IV SOLN
INTRAVENOUS | Status: AC
  Administered 2015-02-01: 3 ug via INTRAVENOUS
  Filled 2015-02-01: qty 2

## 2015-02-01 MED ORDER — SODIUM CHLORIDE 0.9 % IV SOLN
Freq: Once | INTRAVENOUS | Status: AC
  Administered 2015-02-01: 14:00:00 via INTRAVENOUS

## 2015-02-01 MED ORDER — ALTEPLASE 2 MG IJ SOLR
2.0000 mg | Freq: Once | INTRAMUSCULAR | Status: DC | PRN
  Filled 2015-02-01: qty 2

## 2015-02-01 MED ORDER — LIDOCAINE-PRILOCAINE 2.5-2.5 % EX CREA
1.0000 "application " | TOPICAL_CREAM | CUTANEOUS | Status: DC | PRN
  Filled 2015-02-01: qty 5

## 2015-02-01 MED ORDER — PENTAFLUOROPROP-TETRAFLUOROETH EX AERO: 1.0000 "application " | INHALATION_SPRAY | CUTANEOUS | Status: DC | PRN

## 2015-02-01 MED ORDER — LIDOCAINE HCL (PF) 1 % IJ SOLN: 5.0000 mL | INTRAMUSCULAR | Status: DC | PRN

## 2015-02-01 NOTE — Progress Notes (Signed)
PT Cancellation Note  Patient Details Name: Brandon Sharp MRN: [id number] DOB: [birth year]   Cancelled Treatment:    Reason Eval/Treat Not Completed: Patient at procedure or test/unavailable   Second attempt; Getting 2DEcho;   Will follow up later today as time allows;  Otherwise, will follow up for PT tomorrow;   Thank you,  Roney Marion, PT  Acute Rehabilitation Services Pager [phone] Office [phone]     Roney Marion [facility] 02/01/2015, 2:12 PM

## 2015-02-01 NOTE — Progress Notes (Signed)
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 Presented after a mechanical fall resulting in left hip fracture. He was hospitalized for further management. He underwent open reduction and internal fixation on 9/6. Has severe thrombocytopenia.  Past medical history:  Past Medical History  Diagnosis Date  . Type 2 diabetes mellitus   . Essential hypertension   . Myocardial infarction 1985, 1990  . History of DVT of lower extremity     Left leg  . Anemia   . Blood transfusion   . Peripheral vascular disease   . ESRD (end stage renal disease)     Hemodialysis MWF, Dr. [name], [facility]  . History of DVT (deep vein thrombosis) 08/25/2014    Nearly occluded RIJ and L subclavian DVT  . Ischemic cardiomyopathy     Status post cardiac transplant at [facility]  . Vasculopathy of cardiac allograft     Moderate CAD 09/2013 - Dr. Corky [facility])  . History of pulmonary aspergillosis   . Renal transplant recipient     Failed  . Mobility impaired     Uses walker and wheelchair  . Heart transplant, orthotopic, status     [facility] 2001, LVEF 50-55% 09/2013    Consultants: Orthopedics, cardiology, nephrology, hematology  Procedures:  ORIF 9/6 Hemodialysis  2-D echocardiogram is pending  Antibiotics: None  Subjective: Patient seen while he was getting dialyzed. He has pain in his left hip area, especially with movement. Denies any chest pain or shortness of breath. No episodes of bleeding.  Objective: Vital Signs  Filed Vitals:   02/01/15 1038  02/01/15 1048 02/01/15 1051 02/01/15 1109  BP: 86/47 99/50 98/49  111/55  Pulse: 96 97 96 96  Temp: 98.1 F (36.7 C) 98.1 F (36.7 C)  98.1 F (36.7 C)  TempSrc: Oral Oral  Oral  Resp: 18 18 18 18   Height:      Weight:      SpO2: 97%       Intake/Output Summary (Last 24 hours) at 02/01/15 1113 Last data filed at 02/01/15 1109  Gross per 24 hour  Intake   1612 ml  Output     50 ml  Net   1562 ml   Filed Weights   01/30/15 1610 01/31/15 2020 02/01/15 0849  Weight: 69.8 kg (153 lb 14.1 oz) 70.2 kg (154 lb 12.2 oz) 69.9 kg (154 lb 1.6 oz)    General appearance: alert, cooperative, appears stated age and no distress Resp: clear to auscultation bilaterally Cardio: regular rate and rhythm, S1, S2 normal, no murmur, click, rub or gallop GI: soft, non-tender; bowel sounds normal; no masses,  no organomegaly Extremities: No bruising noted in the left hip. Neurologic: No focal deficit  Lab Results:  Basic Metabolic Panel:  Recent Labs Lab 01/30/15 0217 01/30/15 1300 01/30/15 2315 01/31/15 0522 01/31/15 1550 02/01/15 0930  NA 135 136 136 138 137 135  K 3.8 3.6 4.6 5.0 4.5 4.7  CL 97* 98* 98* 100*  --  97*  CO2 29 30 30 28   --  30  GLUCOSE 131* 113* 142* 106* 106* 102*  BUN 29* 31* 15 19  --  36*  CREATININE  5.64* 6.24* 3.68* 4.12*  --  5.78*  CALCIUM 8.4* 8.3* 8.0* 8.3*  --  8.2*  PHOS  --  3.0  --   --   --  5.3*   Liver Function Tests:  Recent Labs Lab 01/30/15 1300 01/30/15 2315 02/01/15 0930  AST  --  25  --   ALT  --  18  --   ALKPHOS  --  69  --   BILITOT  --  1.9*  --   PROT  --  5.1*  --   ALBUMIN 2.2* 2.3* 1.9*   CBC:  Recent Labs Lab 01/30/15 0217 01/30/15 1144 01/30/15 2315 01/31/15 0522 01/31/15 1535 01/31/15 1550 02/01/15 0520 02/01/15 0930  WBC 3.8* 3.0* 2.9* 3.3* 2.7*  --  2.8* 2.3*  NEUTROABS 2.1 1.4* 1.7  --   --   --   --  1.2*  HGB 7.4* 6.9* 9.4* 9.7* 8.3* 8.8* 7.1* 7.1*  HCT 22.8* 21.2* 28.9* 28.5* 25.5* 26.0* 22.0* 21.7*    MCV 90.1 88.7 88.7 87.2 89.2  --  90.5 90.8  PLT 56* 34* 27* 27* 28*  --  30* 27*   CBG:  Recent Labs Lab 01/31/15 1429 01/31/15 1636 01/31/15 1807 02/01/15 0025 02/01/15 0604  GLUCAP 107* 110* 116* 107* 92    Recent Results (from the past 240 hour(s))  MRSA PCR Screening     Status: None   Collection Time: 01/30/15  8:45 AM  Result Value Ref Range Status   MRSA by PCR NEGATIVE NEGATIVE Final    Comment:        The GeneXpert MRSA Assay (FDA approved for NASAL specimens only), is one component of a comprehensive MRSA colonization surveillance program. It is not intended to diagnose MRSA infection nor to guide or monitor treatment for MRSA infections.       Studies/Results: Dg Hip Operative Unilat With Pelvis Left  01/31/2015   CLINICAL DATA:  Status post ORIF of the left hip foreign intertrochanteric fracture  EXAM: OPERATIVE left HIP (WITH PELVIS IF PERFORMED) single AP portable VIEWS  TECHNIQUE: Fluoroscopic spot image(s) were submitted for interpretation post-operatively.  COMPARISON:  Preoperative study of January 30, 2015  FINDINGS: Reported fluoro time is 10 seconds. The single spot film reveals interval placement of a telescoping nail as well as intra medullary rod. The alignment of the fracture fragments is anatomic.  IMPRESSION: There is no immediate postprocedure complication following ORIF of the left hip.   Electronically Signed   By: David  Martinique M.D.   On: 01/31/2015 16:18    Medications:  Scheduled: . sodium chloride   Intravenous Once  . sodium chloride   Intravenous Once  . antiseptic oral rinse  7 mL Mouth Rinse BID  . atorvastatin  40 mg Oral QPM  . azaTHIOprine  75 mg Oral q morning - 10a  . calcium acetate  667 mg Oral BID WC  . carvedilol  6.25 mg Oral QHS  . colesevelam  1,875 mg Oral BID WC  . darbepoetin (ARANESP) injection - DIALYSIS  200 mcg Intravenous Q Wed-HD  . doxercalciferol  3 mcg Intravenous Q M,W,F-HD  . feeding supplement  1  Container Oral BID BM  . feeding supplement (PRO-STAT SUGAR FREE 64)  30 mL Oral Q1500  .  HYDROmorphone (DILAUDID) injection  1 mg Intramuscular Once  . Influenza vac split quadrivalent PF  0.5 mL Intramuscular Tomorrow-1000  . insulin aspart  0-9 Units Subcutaneous Q6H  . isosorbide dinitrate  40  mg Oral 3 times per day  . pantoprazole  40 mg Oral Daily  . predniSONE  5 mg Oral Q breakfast  . senna-docusate  3 tablet Oral BID  . Sirolimus  1.5 mg Oral q morning - 10a   Continuous: . sodium chloride 10 mL/hr at 01/31/15 1432  . sodium chloride 10 mL/hr at 01/31/15 1815   NHA:FBXUXY chloride, sodium chloride, acetaminophen **OR** acetaminophen, alteplase, HYDROcodone-acetaminophen, HYDROmorphone (DILAUDID) injection, lidocaine (PF), lidocaine-prilocaine, methocarbamol **OR** methocarbamol (ROBAXIN)  IV, metoCLOPramide **OR** metoCLOPramide (REGLAN) injection, ondansetron **OR** ondansetron (ZOFRAN) IV, oxyCODONE, oxyCODONE, pentafluoroprop-tetrafluoroeth  Assessment/Plan:  Principal Problem:   Closed left hip fracture Active Problems:   Heart transplanted   S/P CABG (coronary artery bypass graft)   DM (diabetes mellitus), type 2 with renal complications   ESRD on hemodialysis   Immunosuppressed status   Kidney transplant failure   Protein-calorie malnutrition, severe   Thrombocytopenia   Anemia due to chronic kidney disease   Essential hypertension   Chronic anemia   Chronic renal failure   Malnutrition of moderate degree    Closed left hip fracture This was due to a mechanical fall. Patient was seen by orthopedics. Patient status post ORIF. Pain management. PT and OT to evaluate. Seen by cardiology for clearance. 2D echocardiogram was ordered here, pending. Last documented echo from 2015 at [facility] [address] with EF 60-65%, mild AR. Pt is s/p cath in 09/2013 at [facility] with mod non-obstructive disease  Pancytopenia with severe thrombocytopenia and anemia This is thought to be  secondary to patient's immunosuppressive agents. Patient counseled much lower than his baseline, which is between 50 and 100. Patient had seen hematology in the past. Discussed with Dr. Alvy Bimler today. She will consult on the patient. She recommends blood transfusion which the patient is getting with hemodialysis. She also recommends transfusing 1 unit of platelet, which has been ordered. No overt bleeding identified.  History of DVT Patient was on Coumadin at home. However, now his platelet counts are significantly lower than his baseline. Per hematology hold all forms of anticoagulation for now as the risk of bleeding is much high compared to benefits.  ESRD on hemodialysis. Monday, Wednesday, Friday. S/p failed renal transplant. Nephrology is following.  Diabetes mellitus 2 with renal complications Continuing sliding scale insulin.  Constipation. Improved With Senokot regimen which the patient is continuing.  Recent groin infection. Patient completed a course of vancomycin.  History of Cardiac and renal transplant Continue immunosuppressive medication  DVT Prophylaxis: Teds    Code Status: DO NOT RESUSCITATE  Family Communication: Discussed with the patient  Disposition Plan: Will need SNF when ready for discharge.    LOS: 2 days   [place] [facility] Pager [phone] 02/01/2015, 11:13 AM  If 7PM-7AM, please contact night-coverage at [email or web address], password [facility]

## 2015-02-01 NOTE — Progress Notes (Signed)
PT Cancellation Note  Patient Details Name: Brandon Sharp MRN: [id number] DOB: 1945-11-28   Cancelled Treatment:        Currently in HD;   Will follow up later today as time allows;  Otherwise, will follow up for PT tomorrow;   Thank you,  Roney Marion, PT  Acute Rehabilitation Services Pager [phone] Office [phone]     Roney Marion [facility] 02/01/2015, 12:10 PM

## 2015-02-01 NOTE — Progress Notes (Signed)
PT Cancellation Note  Patient Details Name: Brandon Sharp MRN: [id number] DOB: [birth year]   Cancelled Treatment:    Reason Eval/Treat Not Completed: Pain limiting ability to participate   Second attempt for PT eval after HD today, however, [name] pain significantly limited his ability to participate;   Will follow-up tomorrow;   Roney Marion, PT  Acute Rehabilitation Services Pager [phone] Office [phone]    Roney Marion [facility] 02/01/2015, 4:00 PM

## 2015-02-01 NOTE — Progress Notes (Signed)
[facility] Progress Note  Assessment/Plan: 1. Left hip fx - s/p surgery 9/6 -duda 2. ESRD -MWF- HD today - no heparin, labs pending -next HD Friday 3. Anemia - recent transfusion; max ESA; Hgb 9.7 down to 7.1 - transfuse 2 units PRBC on HD today - 4. Secondary hyperparathyroidism - hectorol  5. HTN/volume - UF only 270 Monday - post bed weight was 69.8 6. Nutrition - npo for surg/ on supplements and prostat when eating 7. DM 8. 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 Heart Tx 2001- current meds 10. Thrombocytopenia - platelets low down to 27 this am - had platelets during surgery yesterday  Myriam Jacobson, PA-C [place] [phone] 02/01/2015,10:08 AM  LOS: 2 days   Pt seen, examined and agree w A/P as above.  Kelly Splinter MD pager [phone]    cell [phone] 02/01/2015, 12:06 PM    Subjective:     Objective Filed Vitals:   02/01/15 0849 02/01/15 0850 02/01/15 0852 02/01/15 0939  BP: 112/56 98/52 101/54 101/49  Pulse: 96 94 92 94  Temp: 98.5 F (36.9 C)     TempSrc: Oral     Resp: 18     Height:      Weight: 69.9 kg (154 lb 1.6 oz)     SpO2: 97%      Physical Exam goal 1.5 General: NAD supine on HD Heart: RRR Lungs: no rales Abdomen: soft Extremities: left LE + edema, left hip dressing intact, RLE no overt edema, SCDs in place Dialysis Access: right thigh AVGG  Dialysis Orders: 4h 68kg 2/2 bath Heparin none R thigh AVG Aranesp 200/ wed Hect 3 ug tiw  Additional Objective Labs: Basic Metabolic Panel:  Recent Labs Lab 01/30/15 1300 01/30/15 2315 01/31/15 0522 01/31/15 1550  NA 136 136 138 137  K 3.6 4.6 5.0 4.5  CL 98* 98* 100*  --   CO2 30 30 28   --   GLUCOSE 113* 142* 106* 106*  BUN 31* 15 19  --   CREATININE 6.24* 3.68* 4.12*  --   CALCIUM 8.3* 8.0* 8.3*  --   PHOS 3.0  --   --   --    Liver Function Tests:  Recent Labs Lab 01/30/15 1300 01/30/15 2315  AST  --  25  ALT  --  18  ALKPHOS   --  69  BILITOT  --  1.9*  PROT  --  5.1*  ALBUMIN 2.2* 2.3*   CBC:  Recent Labs Lab 01/30/15 1144 01/30/15 2315 01/31/15 0522 01/31/15 1535 01/31/15 1550 02/01/15 0520 02/01/15 0930  WBC 3.0* 2.9* 3.3* 2.7*  --  2.8* 2.3*  NEUTROABS 1.4* 1.7  --   --   --   --  1.2*  HGB 6.9* 9.4* 9.7* 8.3* 8.8* 7.1* 7.1*  HCT 21.2* 28.9* 28.5* 25.5* 26.0* 22.0* 21.7*  MCV 88.7 88.7 87.2 89.2  --  90.5 90.8  PLT 34* 27* 27* 28*  --  30* 27*   Blood Culture    Component Value Date/Time   SDES CATH TIP 12/28/2014 1535   SPECREQUEST LEFT IJ 12/28/2014 1535   CULT  12/28/2014 1535    NO GROWTH 2 DAYS Performed at [facility] 12/31/2014 FINAL 12/28/2014 1535    Cardiac Enzymes: No results for input(s): CKTOTAL, CKMB, CKMBINDEX, TROPONINI in the last 168 hours. CBG:  Recent Labs Lab 01/31/15 1429 01/31/15 1636 01/31/15 1807 02/01/15 0025 02/01/15 0604  GLUCAP 107*  110* 116* 107* 92   Iron Studies: No results for input(s): IRON, TIBC, TRANSFERRIN, FERRITIN in the last 72 hours. @lablastinr3 @ Studies/Results: Dg Hip Operative Unilat With Pelvis Left  01/31/2015   CLINICAL DATA:  Status post ORIF of the left hip foreign intertrochanteric fracture  EXAM: OPERATIVE left HIP (WITH PELVIS IF PERFORMED) single AP portable VIEWS  TECHNIQUE: Fluoroscopic spot image(s) were submitted for interpretation post-operatively.  COMPARISON:  Preoperative study of January 30, 2015  FINDINGS: Reported fluoro time is 10 seconds. The single spot film reveals interval placement of a telescoping nail as well as intra medullary rod. The alignment of the fracture fragments is anatomic.  IMPRESSION: There is no immediate postprocedure complication following ORIF of the left hip.   Electronically Signed   By: David  Martinique M.D.   On: 01/31/2015 16:18   Medications: . sodium chloride 10 mL/hr at 01/31/15 1432  . sodium chloride 10 mL/hr at 01/31/15 1815   . sodium chloride   Intravenous  Once  . sodium chloride   Intravenous Once  . antiseptic oral rinse  7 mL Mouth Rinse BID  . atorvastatin  40 mg Oral QPM  . azaTHIOprine  75 mg Oral q morning - 10a  . calcium acetate  667 mg Oral BID WC  . carvedilol  6.25 mg Oral QHS  . clindamycin (CLEOCIN) IV  600 mg Intravenous Q6H  . colesevelam  1,875 mg Oral BID WC  . darbepoetin (ARANESP) injection - DIALYSIS  200 mcg Intravenous Q Wed-HD  . doxercalciferol  3 mcg Intravenous Q M,W,F-HD  . feeding supplement  1 Container Oral BID BM  . feeding supplement (PRO-STAT SUGAR FREE 64)  30 mL Oral Q1500  .  HYDROmorphone (DILAUDID) injection  1 mg Intramuscular Once  . Influenza vac split quadrivalent PF  0.5 mL Intramuscular Tomorrow-1000  . insulin aspart  0-9 Units Subcutaneous Q6H  . isosorbide dinitrate  40 mg Oral 3 times per day  . pantoprazole  40 mg Oral Daily  . predniSONE  5 mg Oral Q breakfast  . senna-docusate  3 tablet Oral BID  . Sirolimus  1.5 mg Oral q morning - 10a

## 2015-02-01 NOTE — Progress Notes (Signed)
Patient Name: Brandon Sharp Date of Encounter: 02/01/2015   SUBJECTIVE  Seen in dialysis center. No cp, sob or palpitation. S/p L hip surgery 01/31/15.  CURRENT MEDS . sodium chloride   Intravenous Once  . sodium chloride   Intravenous Once  . sodium chloride   Intravenous Once  . antiseptic oral rinse  7 mL Mouth Rinse BID  . atorvastatin  40 mg Oral QPM  . azaTHIOprine  75 mg Oral q morning - 10a  . calcium acetate  667 mg Oral BID WC  . carvedilol  6.25 mg Oral QHS  . clindamycin (CLEOCIN) IV  600 mg Intravenous Q6H  . colesevelam  1,875 mg Oral BID WC  . darbepoetin (ARANESP) injection - DIALYSIS  200 mcg Intravenous Q Wed-HD  . doxercalciferol  3 mcg Intravenous Q M,W,F-HD  . feeding supplement  1 Container Oral BID BM  . feeding supplement (PRO-STAT SUGAR FREE 64)  30 mL Oral Q1500  .  HYDROmorphone (DILAUDID) injection  1 mg Intramuscular Once  . Influenza vac split quadrivalent PF  0.5 mL Intramuscular Tomorrow-1000  . insulin aspart  0-9 Units Subcutaneous Q6H  . isosorbide dinitrate  40 mg Oral 3 times per day  . pantoprazole  40 mg Oral Daily  . predniSONE  5 mg Oral Q breakfast  . senna-docusate  3 tablet Oral BID  . Sirolimus  1.5 mg Oral q morning - 10a    OBJECTIVE  Filed Vitals:   02/01/15 0850 02/01/15 0852 02/01/15 0939 02/01/15 1013  BP: 98/52 101/54 101/49 99/47  Pulse: 94 92 94 95  Temp:      TempSrc:      Resp:      Height:      Weight:      SpO2:        Intake/Output Summary (Last 24 hours) at 02/01/15 1024 Last data filed at 02/01/15 0900  Gross per 24 hour  Intake   1347 ml  Output     50 ml  Net   1297 ml   Filed Weights   01/30/15 1610 01/31/15 2020 02/01/15 0849  Weight: 153 lb 14.1 oz (69.8 kg) 154 lb 12.2 oz (70.2 kg) 154 lb 1.6 oz (69.9 kg)    PHYSICAL EXAM  General: Pleasant, NAD. Neuro: Alert and oriented X 3. Moves all extremities spontaneously. Psych: Normal affect. HEENT:  Normal  Neck: Supple without bruits.  +JVD. Lungs:  Resp regular and unlabored, CTA. Heart: RRR no s3, s4. 2/6 systolic murmurs. Abdomen: Soft, non-tender, non-distended, BS + x 4.  Extremities: No clubbing, cyanosis. Trace Left Le edema. SCDs in place. DP 1+ and equal bilaterally.  Accessory Clinical Findings  CBC  Recent Labs  01/30/15 2315  02/01/15 0520 02/01/15 0930  WBC 2.9*  < > 2.8* 2.3*  NEUTROABS 1.7  --   --  1.2*  HGB 9.4*  < > 7.1* 7.1*  HCT 28.9*  < > 22.0* 21.7*  MCV 88.7  < > 90.5 90.8  PLT 27*  < > 30* 27*  < > = values in this interval not displayed. Basic Metabolic Panel  Recent Labs  01/30/15 1300  01/31/15 0522 01/31/15 1550 02/01/15 0930  NA 136  < > 138 137 135  K 3.6  < > 5.0 4.5 4.7  CL 98*  < > 100*  --  97*  CO2 30  < > 28  --  30  GLUCOSE 113*  < > 106* 106* 102*  BUN 31*  < > 19  --  36*  CREATININE 6.24*  < > 4.12*  --  5.78*  CALCIUM 8.3*  < > 8.3*  --  8.2*  PHOS 3.0  --   --   --  5.3*  < > = values in this interval not displayed. Liver Function Tests  Recent Labs  01/30/15 2315 02/01/15 0930  AST 25  --   ALT 18  --   ALKPHOS 69  --   BILITOT 1.9*  --   PROT 5.1*  --   ALBUMIN 2.3* 1.9*     Radiology/Studies  Dg Chest 1 View  01/30/2015   CLINICAL DATA:  Patient fell this morning landing on the left side. Left hip pain and deformity.  EXAM: CHEST  1 VIEW  COMPARISON:  12/23/2014  FINDINGS: Postoperative changes in the mediastinum. Vascular grafts in the upper chest bilaterally. Calcified and tortuous aorta. Since the previous study, the central line has been removed. Shallow inspiration. Cardiac enlargement without significant vascular congestion or edema. No focal consolidation. No blunting of costophrenic angles.  IMPRESSION: Cardiac enlargement.  No evidence of active pulmonary disease.   Electronically Signed   By: Lucienne Capers M.D.   On: 01/30/2015 04:14   Dg Tibia/fibula Left  01/30/2015   CLINICAL DATA:  Patient fell this morning, landing on the left  side. Pain.  EXAM: LEFT TIBIA AND FIBULA - 2 VIEW  COMPARISON:  None.  FINDINGS: Diffuse bone demineralization. No evidence of acute fracture or dislocation. No focal bone lesion or bone destruction. Extensive vascular calcifications. Surgical clips in the soft tissues. Old periosteal reaction in the fibular shaft.  IMPRESSION: Diffuse bone demineralization.  No acute bony abnormalities.   Electronically Signed   By: Lucienne Capers M.D.   On: 01/30/2015 04:48   Dg Hand Complete Left  01/30/2015   CLINICAL DATA:  Patient fell this morning, landing on the left. Pain fifth metacarpal area.  EXAM: LEFT HAND - COMPLETE 3+ VIEW  COMPARISON:  None.  FINDINGS: Diffuse bone demineralization. Diffuse degenerative changes in the interphalangeal joints, first carpometacarpal and metacarpal phalangeal joints, and in the intercarpal joints. No acute fracture or dislocation demonstrated. Soft tissue swelling about the medial aspect of the left hand. Vascular calcifications.  IMPRESSION: Diffuse bone demineralization and degenerative changes. No acute bony abnormalities.   Electronically Signed   By: Lucienne Capers M.D.   On: 01/30/2015 04:10   Dg Hip Operative Unilat With Pelvis Left  01/31/2015   CLINICAL DATA:  Status post ORIF of the left hip foreign intertrochanteric fracture  EXAM: OPERATIVE left HIP (WITH PELVIS IF PERFORMED) single AP portable VIEWS  TECHNIQUE: Fluoroscopic spot image(s) were submitted for interpretation post-operatively.  COMPARISON:  Preoperative study of January 30, 2015  FINDINGS: Reported fluoro time is 10 seconds. The single spot film reveals interval placement of a telescoping nail as well as intra medullary rod. The alignment of the fracture fragments is anatomic.  IMPRESSION: There is no immediate postprocedure complication following ORIF of the left hip.   Electronically Signed   By: David  Martinique M.D.   On: 01/31/2015 16:18   Dg Hip Unilat With Pelvis 2-3 Views Left  01/30/2015    CLINICAL DATA:  Patient fell onto the left side this morning. Left hip pain and deformity.  EXAM: DG HIP (WITH OR WITHOUT PELVIS) 2-3V LEFT  COMPARISON:  None.  FINDINGS: Acute comminuted inter trochanteric fracture of the left hip with impaction and valgus orientation  of the fracture fragments. Mildly displaced lesser trochanteric fragment. Prominent degenerative changes in both hips. No evidence of dislocation of the hip. The visualized pelvis appears intact. SI joints and symphysis pubis are not displaced. Extensive vascular calcifications.  IMPRESSION: Acute comminuted inter trochanteric fracture of the left proximal femur with impaction and valgus orientation of the fracture fragments.   Electronically Signed   By: Lucienne Capers M.D.   On: 01/30/2015 04:15    ASSESSMENT AND PLAN Principal Problem:   Closed left hip fracture Active Problems:   Heart transplanted   S/P CABG (coronary artery bypass graft)   DM (diabetes mellitus), type 2 with renal complications   ESRD on hemodialysis   Immunosuppressed status   Kidney transplant failure   Protein-calorie malnutrition, severe   Thrombocytopenia   Anemia due to chronic kidney disease   Essential hypertension   Chronic anemia   Chronic renal failure    Plan: No chest pain. No plan to ischemic work up. Echo still pending. S/p L hip surgery. Currently undergoing HD (MWF).  Coumadin on hold for surgery. Low platelets (27), received platelet treatment yesterday.  BP Low, follow closely. Coumadin discontinued. Heparin per pharmacy - will evaluate this AM.   Signed, Bhagat,Bhavinkumar PA-C  As above; patient seen and examined; no CP or dyspnea; continue present cardiac meds at DC. Plt count remains low; further evauation and management per primary care. Heparin/coumadin when plt count improves per primary care; we will sign off; please call with questions. Kirk Ruths

## 2015-02-01 NOTE — Consult Note (Signed)
[place]  [phone]   Requesting Provider: [facility]  Consulting Provider: Heath Lark, MD  Primary Oncologist: Heath Lark, MD  Patient Care Team: [name], MD as PCP - General (Family Medicine) Edrick Oh, MD as Consulting Physician (Nephrology)  HOSPITAL CONSULTATION  NOTE  Reason for Consultation: Thrombocytopenia  I have seen the patient, examined him and edited the notes as follows  HPI: Brandon Sharp is a 69 year old man with multiple medical issues listed below, admitted on 9/6 sustaining a mechanical fall, causing a closed left intertrochanteric hip fracture with impaction. The patient is slowly recuperating from surgery. He denies any respiratory or cardiac complaints. He denies any lower extremity swelling. He denies any nausea, heartburn. He was constipated, now resolved. His appetite is decreased. He tolerates hemodialysis adequately. Denies any bleeding issues such as epistaxis hematemesis hematuria hemoptysis or hematochezia. He has not been mobilizing much Due to increased fatigue due to chronic disease. 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 He had been seen at the clinic In May 2016, At which time he was clinically stable.He was on Coumadin 5 mg daily, For his anemia he was getting erythropoietin stimulating agent by Nephrology. During this admission, his hemoglobin was 7.4, white count 3.8, and platelets were 56,000. Today, he numbers are slowly trending down, With a white count of 2.3, hemoglobin 7.1 and platelets 27,000. MCV is normal. He was not on aspirin. During this hospitalization he did receive Heparin however this was discontinued on 02/01/15. He did receive 1 unit of platelets prior to surgery on 01/31/2015. Hematology has been asked to see the patient in consultation to closely monitor these abnormal counts.  SUMMARY OF  HEMATOLOGIC HISTORY: Please see detailed consult note dated 08/26/2014 for further details. He had remote history of right lower extremity DVT.  The patient had history for heart transplant for ischemic cardiomyopathy and renal transplant. His renal transplant failed 3 years ago and he had been HD dependent via fistula. He remained on chronic anti-rejection medications He has had several hospitalizations since April 2016  His last hospitalization was in July of 2016 for physical deconditioning. He was discharged on warfarin with PCP monitoring, goal INR 2.5-3 with duration life-long due to recurrent DVT. 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 According to him his INR had been therapeutic.    Past Medical History  Diagnosis Date  . Type 2 diabetes mellitus   . Essential hypertension   . Myocardial infarction 1985, 1990  . History of DVT of lower extremity     Left leg  . Anemia   . Blood transfusion   . Peripheral vascular disease   . ESRD (end stage renal disease)     Hemodialysis MWF, Dr. [name], [facility]  . History of DVT (deep vein thrombosis) 08/25/2014    Nearly occluded RIJ and L subclavian DVT  . Ischemic cardiomyopathy     Status post cardiac transplant at [facility]  . Vasculopathy of cardiac allograft     Moderate CAD 09/2013 - Dr. Corky Mull [facility]  . History of pulmonary aspergillosis   . Renal transplant recipient     Failed  . Mobility impaired     Uses walker and wheelchair  . Heart transplant, orthotopic, status     [facility] 2001, LVEF 50-55% 09/2013     MEDICATIONS:  Scheduled Meds: .  sodium chloride   Intravenous Once  . sodium chloride   Intravenous Once  . sodium chloride   Intravenous Once  . antiseptic oral rinse  7 mL Mouth Rinse BID  . atorvastatin  40 mg Oral QPM  . azaTHIOprine  75 mg Oral q morning - 10a  . calcium acetate  667 mg Oral BID WC  . carvedilol  6.25 mg Oral QHS  . clindamycin  (CLEOCIN) IV  600 mg Intravenous Q6H  . colesevelam  1,875 mg Oral BID WC  . Darbepoetin Alfa      . darbepoetin (ARANESP) injection - DIALYSIS  200 mcg Intravenous Q Wed-HD  . doxercalciferol      . doxercalciferol  3 mcg Intravenous Q M,W,F-HD  . feeding supplement  1 Container Oral BID BM  . feeding supplement (PRO-STAT SUGAR FREE 64)  30 mL Oral Q1500  .  HYDROmorphone (DILAUDID) injection  1 mg Intramuscular Once  . Influenza vac split quadrivalent PF  0.5 mL Intramuscular Tomorrow-1000  . insulin aspart  0-9 Units Subcutaneous Q6H  . isosorbide dinitrate  40 mg Oral 3 times per day  . pantoprazole  40 mg Oral Daily  . predniSONE  5 mg Oral Q breakfast  . senna-docusate  3 tablet Oral BID  . Sirolimus  1.5 mg Oral q morning - 10a   Continuous Infusions: . sodium chloride 10 mL/hr at 01/31/15 1432  . sodium chloride 10 mL/hr at 01/31/15 1815   PRN Meds:.sodium chloride, sodium chloride, acetaminophen **OR** acetaminophen, alteplase, HYDROcodone-acetaminophen, HYDROmorphone (DILAUDID) injection, lidocaine (PF), lidocaine-prilocaine, methocarbamol **OR** methocarbamol (ROBAXIN)  IV, metoCLOPramide **OR** metoCLOPramide (REGLAN) injection, ondansetron **OR** ondansetron (ZOFRAN) IV, oxyCODONE, oxyCODONE, pentafluoroprop-tetrafluoroeth  ALLERGIES:  Allergies  Allergen Reactions  . Lisinopril Swelling    Lips and tongue swell  . Niacin And Related Other (See Comments)    unknown  . Norvasc [Amlodipine Besylate] Rash    Flushing  . Penicillins Rash    Review of systems:   Constitutional: Denies fevers, chills or abnormal night sweats Eyes: Denies blurriness of vision, double vision or watery eyes Ears, nose, mouth, throat, and face: Denies mucositis or sore throat Respiratory: Denies cough, dyspnea or wheezes Cardiovascular: Denies palpitation, chest discomfort. Has mild bilateral lower extremity swelling Gastrointestinal:  Denies nausea, heartburn or change in bowel  habits Skin: Denies abnormal skin rashes Lymphatics: Denies new lymphadenopathy or easy bruising Neurological:Denies numbness, tingling or new weaknesses Musculoskeletal:  He has intact left hip dressing. Behavioral/Psych: Mood is stable, no new changes  All other systems were reviewed with the patient and are negative.    Family History  Problem Relation Age of Onset  . Heart disease Mother   . Hyperlipidemia Mother   . Hypertension Mother   . Diabetes Mother   . Hyperlipidemia Father   . Hypertension Father   . Heart disease Father   . Diabetes Brother   . Heart disease Brother     Heart Disease before age 89  . Hyperlipidemia Brother   . Hypertension Brother   . Heart attack Brother      Past Surgical History  Procedure Laterality Date  . Nephrectomy transplanted organ  2008  . Av fistula repair Right     Forearm fistula  . Av fistula placement  ~ 01/2010  . Heart transplant  06/1999  . Colonoscopy  03/17/2012    Procedure: COLONOSCOPY;  Surgeon: Lear Ng, MD;  Location: [facility];  Service: Endoscopy;  Laterality: N/A;  . Coronary artery bypass  graft  1990    CABG X3  . Colonoscopy with propofol N/A 12/14/2013    Procedure: COLONOSCOPY WITH PROPOFOL;  Surgeon: Lear Ng, MD;  Location: [facility];  Service: Endoscopy;  Laterality: N/A;  . Video bronchoscopy Bilateral 04/14/2014    Procedure: VIDEO BRONCHOSCOPY WITH FLUORO;  Surgeon: Collene Gobble, MD;  Location: [place];  Service: Cardiopulmonary;  Laterality: Bilateral;  . Shuntogram Right 05/09/2014    Procedure: FISTULOGRAM;  Surgeon: Conrad [place], MD;  Location: [facility] CATH LAB;  Service: Cardiovascular;  Laterality: Right;  . Coronary angioplasty with stent placement  1985; 1990    prior stents in the 80's and 90's then CABG; s/p heart transplant 2001 s/p BMS mid LAD and proximal RCA 04/21/13 [facility])  . Av fistula placement Right 10/20/2014    Procedure: INSERTION OF ARTERIOVENOUS (AV)  GORE-TEX GRAFT THIGH;  Surgeon: Conrad McNairy, MD;  Location: [place];  Service: Vascular;  Laterality: Right;  . Hematoma evacuation Right 11/21/2014    Procedure: EVACUATION HEMATOMA Right Groin;  Surgeon: Conrad Sibley, MD;  Location: [place];  Service: Vascular;  Laterality: Right;  . Application of wound vac Right 11/21/2014    Procedure: APPLICATION OF WOUND VAC Right Groin;  Surgeon: Conrad Dodson, MD;  Location: [place];  Service: Vascular;  Laterality: Right;  . Peripheral vascular catheterization N/A 11/25/2014    Procedure: Abdominal Aortogram;  Surgeon: Elam Dutch, MD;  Location: [place] CV LAB;  Service: Cardiovascular;  Laterality: N/A;  . Lower extremity angiogram Right 11/25/2014    Procedure: Lower Extremity Angiogram;  Surgeon: Elam Dutch, MD;  Location: [place] CV LAB;  Service: Cardiovascular;  Laterality: Right;  . Flexible sigmoidoscopy N/A 12/30/2014    Procedure: FLEXIBLE SIGMOIDOSCOPY;  Surgeon: Ronald Lobo, MD;  Location: [facility] ENDOSCOPY;  Service: Endoscopy;  Laterality: N/A;    Social History   Social History  . Marital Status: Married    Spouse Name: N/A  . Number of Children: N/A  . Years of Education: N/A   Occupational History  . Not on file.   Social History Main Topics  . Smoking status: Former Smoker -- 1.00 packs/day for 20 years    Types: Cigarettes    Quit date: 05/27/1988  . Smokeless tobacco: Never Used  . Alcohol Use: 0.0 oz/week    0 Standard drinks or equivalent per week     Comment: "Holidays"  . Drug Use: No  . Sexual Activity: Not Currently   Other Topics Concern  . Not on file   Social History Narrative     PHYSICAL EXAMINATION:  Filed Vitals:   02/01/15 1139  BP: 112/56  Pulse: 96  Temp: 98.1 F (36.7 C)  Resp: 18      Intake/Output Summary (Last 24 hours) at 02/01/15 1212 Last data filed at 02/01/15 1139  Gross per 24 hour  Intake   1687 ml  Output     50 ml  Net   1637 ml    GENERAL:alert, no distress and  comfortable, Somewhat somnolent this morning SKIN: skin color, texture, turgor are normal, no rashes or significant lesions or petechial rash EYES: normal, conjunctiva are pale and non-injected, sclera clear OROPHARYNX:no exudate, no erythema and lips, buccal mucosa, and tongue normal  NECK: supple, thyroid normal size, non-tender, without nodularity LYMPH:  no palpable lymphadenopathy in the cervical, axillary or inguinal LUNGS: clear to auscultation and percussion with normal breathing effort HEART: regular rate & rhythm and 2/6  systolic murmurs and bilateral 1 + lower extremity edema. Mechanical devices are in place. Right thigh AV graft ABDOMEN: soft, non-tender and normal bowel sounds Musculoskeletal:no cyanosis of digits and no clubbing.  status post left hip surgery PSYCH: alert & oriented x 3 with fluent speech NEURO: no focal motor/sensory deficits  LABORATORY/RADIOLOGY DATA:   Recent Labs Lab 01/30/15 0217 01/30/15 1144 01/30/15 2315 01/31/15 0522 01/31/15 1535 01/31/15 1550 02/01/15 0520 02/01/15 0930  WBC 3.8* 3.0* 2.9* 3.3* 2.7*  --  2.8* 2.3*  HGB 7.4* 6.9* 9.4* 9.7* 8.3* 8.8* 7.1* 7.1*  HCT 22.8* 21.2* 28.9* 28.5* 25.5* 26.0* 22.0* 21.7*  PLT 56* 34* 27* 27* 28*  --  30* 27*  MCV 90.1 88.7 88.7 87.2 89.2  --  90.5 90.8  MCH 29.2 28.9 28.8 29.7 29.0  --  29.2 29.7  MCHC 32.5 32.5 32.5 34.0 32.5  --  32.3 32.7  RDW 15.6* 15.5 15.4 15.7* 15.6*  --  15.7* 15.8*  LYMPHSABS 1.6 1.4 1.0  --   --   --   --  0.9  MONOABS 0.1 0.2 0.2  --   --   --   --  0.1  EOSABS 0.0 0.0 0.0  --   --   --   --  0.0  BASOSABS 0.0 0.0 0.0  --   --   --   --  0.0    CMP    Recent Labs Lab 01/30/15 0217 01/30/15 1300 01/30/15 2315 01/31/15 0522 01/31/15 1550 02/01/15 0930  NA 135 136 136 138 137 135  K 3.8 3.6 4.6 5.0 4.5 4.7  CL 97* 98* 98* 100*  --  97*  CO2 29 30 30 28   --  30  GLUCOSE 131* 113* 142* 106* 106* 102*  BUN 29* 31* 15 19  --  36*  CREATININE 5.64* 6.24*  3.68* 4.12*  --  5.78*  CALCIUM 8.4* 8.3* 8.0* 8.3*  --  8.2*  AST  --   --  25  --   --   --   ALT  --   --  18  --   --   --   ALKPHOS  --   --  69  --   --   --   BILITOT  --   --  1.9*  --   --   --         Component Value Date/Time   BILITOT 1.9* 01/30/2015 2315   BILIDIR <0.2 04/06/2014 1855   IBILI NOT CALCULATED 04/06/2014 1855         Component Value Date/Time   ESRSEDRATE 130* 04/12/2014 1710     Recent Labs Lab 01/27/15 1220 01/30/15 0217 01/30/15 1144 01/30/15 2315  INR 2.24* 2.11* 2.21* 1.57*      Urinalysis    Component Value Date/Time   COLORURINE YELLOW 04/17/2011 0918   APPEARANCEUR CLEAR 04/17/2011 0918   LABSPEC 1.012 04/17/2011 0918   PHURINE 5.0 04/17/2011 0918   GLUCOSEU NEGATIVE 04/17/2011 0918   HGBUR NEGATIVE 04/17/2011 0918   BILIRUBINUR NEGATIVE 04/17/2011 0918   KETONESUR NEGATIVE 04/17/2011 0918   PROTEINUR >300* 04/17/2011 0918   UROBILINOGEN 0.2 04/17/2011 0918   NITRITE NEGATIVE 04/17/2011 0918   LEUKOCYTESUR NEGATIVE 04/17/2011 0918     Liver Function Tests:  Recent Labs Lab 01/30/15 1300 01/30/15 2315 02/01/15 0930  AST  --  25  --   ALT  --  18  --   ALKPHOS  --  69  --   BILITOT  --  1.9*  --   PROT  --  5.1*  --   ALBUMIN 2.2* 2.3* 1.9*     Recent Labs Lab 01/31/15 1429 01/31/15 1636 01/31/15 1807 02/01/15 0025 02/01/15 0604  GLUCAP 107* 110* 116* 107* 92   Radiology Studies:  Dg Chest 1 View  01/30/2015   CLINICAL DATA:  Patient fell this morning landing on the left side. Left hip pain and deformity.  EXAM: CHEST  1 VIEW  COMPARISON:  12/23/2014  FINDINGS: Postoperative changes in the mediastinum. Vascular grafts in the upper chest bilaterally. Calcified and tortuous aorta. Since the previous study, the central line has been removed. Shallow inspiration. Cardiac enlargement without significant vascular congestion or edema. No focal consolidation. No blunting of costophrenic angles.  IMPRESSION:  Cardiac enlargement.  No evidence of active pulmonary disease.   Electronically Signed   By: Lucienne Capers M.D.   On: 01/30/2015 04:14   Dg Tibia/fibula Left  01/30/2015   CLINICAL DATA:  Patient fell this morning, landing on the left side. Pain.  EXAM: LEFT TIBIA AND FIBULA - 2 VIEW  COMPARISON:  None.  FINDINGS: Diffuse bone demineralization. No evidence of acute fracture or dislocation. No focal bone lesion or bone destruction. Extensive vascular calcifications. Surgical clips in the soft tissues. Old periosteal reaction in the fibular shaft.  IMPRESSION: Diffuse bone demineralization.  No acute bony abnormalities.   Electronically Signed   By: Lucienne Capers M.D.   On: 01/30/2015 04:48   Dg Hand Complete Left  01/30/2015   CLINICAL DATA:  Patient fell this morning, landing on the left. Pain fifth metacarpal area.  EXAM: LEFT HAND - COMPLETE 3+ VIEW  COMPARISON:  None.  FINDINGS: Diffuse bone demineralization. Diffuse degenerative changes in the interphalangeal joints, first carpometacarpal and metacarpal phalangeal joints, and in the intercarpal joints. No acute fracture or dislocation demonstrated. Soft tissue swelling about the medial aspect of the left hand. Vascular calcifications.  IMPRESSION: Diffuse bone demineralization and degenerative changes. No acute bony abnormalities.   Electronically Signed   By: Lucienne Capers M.D.   On: 01/30/2015 04:10   Dg Hip Operative Unilat With Pelvis Left  01/31/2015   CLINICAL DATA:  Status post ORIF of the left hip foreign intertrochanteric fracture  EXAM: OPERATIVE left HIP (WITH PELVIS IF PERFORMED) single AP portable VIEWS  TECHNIQUE: Fluoroscopic spot image(s) were submitted for interpretation post-operatively.  COMPARISON:  Preoperative study of January 30, 2015  FINDINGS: Reported fluoro time is 10 seconds. The single spot film reveals interval placement of a telescoping nail as well as intra medullary rod. The alignment of the fracture fragments is  anatomic.  IMPRESSION: There is no immediate postprocedure complication following ORIF of the left hip.   Electronically Signed   By: David  Martinique M.D.   On: 01/31/2015 16:18   Dg Hip Unilat With Pelvis 2-3 Views Left  01/30/2015   CLINICAL DATA:  Patient fell onto the left side this morning. Left hip pain and deformity.  EXAM: DG HIP (WITH OR WITHOUT PELVIS) 2-3V LEFT  COMPARISON:  None.  FINDINGS: Acute comminuted inter trochanteric fracture of the left hip with impaction and valgus orientation of the fracture fragments. Mildly displaced lesser trochanteric fragment. Prominent degenerative changes in both hips. No evidence of dislocation of the hip. The visualized pelvis appears intact. SI joints and symphysis pubis are not displaced. Extensive vascular calcifications.  IMPRESSION: Acute comminuted inter trochanteric fracture of the left proximal femur  with impaction and valgus orientation of the fracture fragments.   Electronically Signed   By: Lucienne Capers M.D.   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 He will be prone to develop blood clots. Due to the chronic renal failure status, he does not qualify for any other form of anticoagulation therapy other than Coumadin. His INR range is  2.5-3 and it is working well. He denies any excessive bleeding complication. Recommend holding off addition of aspirin due to bleeding risk from chronic thrombocytopenia, related to his chronic immunosuppressive therapy.  I recommend not to resume warfarin until platelet count is greater than 50,000.  Anemia in chronic illness This is multifactorial, related to anemia of chronic renal failure, In the setting of recent blood loss due to surgery He is getting erythropoietin stimulating agent through the nephrology office. He receives 200 g every Wednesday  His last transfusion was today, 02/01/2015, 2  units for hemoglobin of 7.1. Continue to transfuse to maintain a hemoglobin above 7, or if the patient is symptomatic. He is receiving blood transfusion today. Agree with iron supplementation as well. Continue present therapy  Closed Left Hip fracture This is due to a mechanical fall. S/p Left hip surgery on 9/6. He is recuperating well, with adequate pain control.  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  He is asymptomatic.  Continue the same. Will defer to the nephrologist for management of his antirejection medicine.  ESRD on hemodialysis He is getting hemodialysis 3 times a week on Mondays, Wednesdays and Fridays.   Heart transplanted The patient is chronically immunosuppressed due to the hot transplant status.  So far, his heart is working well.  Continue his chronic immunosuppressive therapy as above  Thrombocytopenia due to drugs This is likely due to immunosuppressive treatment, consumption from recent surgery and mild bone marrow suppression from recent surgery The patient denies recent history of bleeding such as epistaxis, hematuria or hematochezia.  He is asymptomatic from the low platelet count. He did receive 1 unit of platelets due to orthopedic surgery due to fracture on the left hip requiring repair. Heparin is on hold since 9/6 Coumadin is on hold. I recommend giving him another unit of platelets to keep platelet count above 30,000 due to increased risk of bleeding in the setting of hemodialysis and recent bone marrow suppression. I will reassess tomorrow.  DNR  Other medical issues as per admitting team    [facility], PA-C 02/01/2015, 12:12 PM [place], Christalynn [place], MD 02/01/2015

## 2015-02-01 NOTE — Progress Notes (Signed)
Patient ID: Brandon Sharp, male   DOB: 06-Nov-1945, 69 y.o.   MRN: [id number] Postoperative day 1 intramedullary nail fixation for intertrochanteric hip fracture on the left. Patient is comfortable this morning he is awake alert and oriented. Plan for physical therapy weightbearing as tolerated on the left.

## 2015-02-01 NOTE — Progress Notes (Signed)
Spoke with Dr. Sharol Given this morning regarding restart of IV heparin. Platelets remain low at 27 this morning. Per Dr. Sharol Given, ok for anticoag from surgical standpoint, but he will defer to primary in regards to what therapy. Dr. Sharol Given stated this type of patient would normally be on ASA or restart of warfarin.  Paged Dr. [place] Pink and relayed the above information. He will see patient this morning and enter orders. Heparin consult per pharmacy still entered. Orders for IV heparin discontinued.   Await new orders and/or further discussion.  Armon Orvis D. Colten Desroches, PharmD, BCPS Clinical Pharmacist Pager: [phone] 02/01/2015 11:04 AM

## 2015-02-01 NOTE — Progress Notes (Signed)
  Echocardiogram 2D Echocardiogram has been performed.  Jennette Dubin 02/01/2015, 3:02 PM

## 2015-02-02 LAB — TYPE AND SCREEN
ABO/RH(D): O POS
ANTIBODY SCREEN: NEGATIVE
UNIT DIVISION: 0
Unit division: 0
Unit division: 0
Unit division: 0

## 2015-02-02 LAB — CBC
HCT: 29.4 % — ABNORMAL LOW (ref 39.0–52.0)
HCT: 27.5 % — ABNORMAL LOW (ref 39.0–52.0)
Hemoglobin: 9.7 g/dL — ABNORMAL LOW (ref 13.0–17.0)
Hemoglobin: 9 g/dL — ABNORMAL LOW (ref 13.0–17.0)
MCH: 29.4 pg (ref 26.0–34.0)
MCH: 29.9 pg (ref 26.0–34.0)
MCHC: 33 g/dL (ref 30.0–36.0)
MCHC: 32.7 g/dL (ref 30.0–36.0)
MCV: 89.1 fL (ref 78.0–100.0)
MCV: 91.4 fL (ref 78.0–100.0)
PLATELETS: 26 10*3/uL — AB (ref 150–400)
Platelets: 20 10*3/uL — CL (ref 150–400)
RBC: 3.3 MIL/uL — ABNORMAL LOW (ref 4.22–5.81)
RBC: 3.01 MIL/uL — ABNORMAL LOW (ref 4.22–5.81)
RDW: 15.6 % — AB (ref 11.5–15.5)
RDW: 15.4 % (ref 11.5–15.5)
WBC: 2.5 10*3/uL — AB (ref 4.0–10.5)
WBC: 2.1 10*3/uL — ABNORMAL LOW (ref 4.0–10.5)

## 2015-02-02 LAB — RENAL FUNCTION PANEL
Albumin: 1.9 g/dL — ABNORMAL LOW (ref 3.5–5.0)
Anion gap: 8 (ref 5–15)
BUN: 25 mg/dL — ABNORMAL HIGH (ref 6–20)
CO2: 30 mmol/L (ref 22–32)
Calcium: 8.4 mg/dL — ABNORMAL LOW (ref 8.9–10.3)
Chloride: 97 mmol/L — ABNORMAL LOW (ref 101–111)
Creatinine, Ser: 4.56 mg/dL — ABNORMAL HIGH (ref 0.61–1.24)
GFR calc Af Amer: 14 mL/min — ABNORMAL LOW (ref >60)
GFR calc non Af Amer: 12 mL/min — ABNORMAL LOW (ref >60)
Glucose, Bld: 143 mg/dL — ABNORMAL HIGH (ref 65–99)
Phosphorus: 3.7 mg/dL (ref 2.5–4.6)
Potassium: 3.9 mmol/L (ref 3.5–5.1)
Sodium: 135 mmol/L (ref 135–145)

## 2015-02-02 LAB — HEPARIN LEVEL (UNFRACTIONATED): Heparin Unfractionated: <0.1 IU/mL — ABNORMAL LOW (ref 0.30–0.70)

## 2015-02-02 LAB — GLUCOSE, CAPILLARY
GLUCOSE-CAPILLARY: 103 mg/dL — AB (ref 65–99)
GLUCOSE-CAPILLARY: 145 mg/dL — AB (ref 65–99)
Glucose-Capillary: 163 mg/dL — ABNORMAL HIGH (ref 65–99)
Glucose-Capillary: 99 mg/dL (ref 65–99)

## 2015-02-02 MED ORDER — LIDOCAINE-PRILOCAINE 2.5-2.5 % EX CREA
1.0000 "application " | TOPICAL_CREAM | CUTANEOUS | Status: DC | PRN
  Filled 2015-02-02: qty 5

## 2015-02-02 MED ORDER — PREDNISONE 5 MG PO TABS
7.5000 mg | ORAL_TABLET | Freq: Every day | ORAL | Status: DC
Start: 2015-02-03 — End: 2015-02-03
  Administered 2015-02-03: 7.5 mg via ORAL
  Filled 2015-02-02: qty 2

## 2015-02-02 MED ORDER — SIROLIMUS 0.5 MG PO TABS
0.5000 mg | ORAL_TABLET | Freq: Every morning | ORAL | Status: DC
  Administered 2015-02-03: 0.5 mg via ORAL

## 2015-02-02 MED ORDER — LIDOCAINE HCL (PF) 1 % IJ SOLN: 5.0000 mL | INTRAMUSCULAR | Status: DC | PRN

## 2015-02-02 MED ORDER — ALTEPLASE 2 MG IJ SOLR
2.0000 mg | Freq: Once | INTRAMUSCULAR | Status: DC | PRN
  Filled 2015-02-02: qty 2

## 2015-02-02 MED ORDER — PENTAFLUOROPROP-TETRAFLUOROETH EX AERO: 1.0000 "application " | INHALATION_SPRAY | CUTANEOUS | Status: DC | PRN

## 2015-02-02 MED ORDER — SODIUM CHLORIDE 0.9 % IV SOLN: 100.0000 mL | INTRAVENOUS | Status: DC | PRN

## 2015-02-02 NOTE — Progress Notes (Signed)
I spoke with Dr. Kirtland Bouchard w cardiology at [facility], she is one of the physicians following Brandon Sharp's heart transplant. We discussed his low blood counts.  She was very concerned, noted that low blood counts could be due to imuran or sirolimus, a new bone marrow disorder, etc. She made the following recommendations>  1. Hold imuran 2. Increase pred 7.5/ day for now 3. Get trough sirolimus level in am 4. Decrease sirolimus to 0.5 mg daily for now 5. If transfer to [facility] is desired they would be happy to take him   Have d/w primary MD.  Brandon Splinter MD (pgr) [phone]    [phone] 02/02/2015, 1:15 PM

## 2015-02-02 NOTE — Care Management Important Message (Signed)
Important Message  Patient Details  Name: Brandon Sharp MRN: [id number] Date of Birth: 13-Jul-1945   Medicare Important Message Given:  Yes-second notification given    Delorse Lek 02/02/2015, 1:47 PM

## 2015-02-02 NOTE — Progress Notes (Signed)
Brandon Sharp   DOB:06-08-1945   HQ#:[id number]   UXL#:[id number]  I have seen the patient, examined him and edited the notes as follows  Subjective: Patient seen and examined. No new events overnight. Denies fevers, chills, night sweats, vision changes, or mucositis. Denies shortness of breath or cough. Denies any chest pain or palpitations. Denies lower extremity swelling. Denies nausea, heartburn or change in bowel habits. Appetite is normal. Denies abnormal skin rashes, or neuropathy. Denies any bleeding issues such as epistaxis, hematemesis, hematuria or hematochezia. Has not been ambulating due to left hip pain.  Scheduled Meds: . sodium chloride   Intravenous Once  . sodium chloride   Intravenous Once  . antiseptic oral rinse  7 mL Mouth Rinse BID  . atorvastatin  40 mg Oral QPM  . azaTHIOprine  75 mg Oral q morning - 10a  . calcium acetate  667 mg Oral BID WC  . carvedilol  6.25 mg Oral QHS  . colesevelam  1,875 mg Oral BID WC  . darbepoetin (ARANESP) injection - DIALYSIS  200 mcg Intravenous Q Wed-HD  . doxercalciferol  3 mcg Intravenous Q M,W,F-HD  . feeding supplement  1 Container Oral BID BM  . feeding supplement (PRO-STAT SUGAR FREE 64)  30 mL Oral Q1500  .  HYDROmorphone (DILAUDID) injection  1 mg Intramuscular Once  . Influenza vac split quadrivalent PF  0.5 mL Intramuscular Tomorrow-1000  . insulin aspart  0-9 Units Subcutaneous Q6H  . isosorbide dinitrate  40 mg Oral 3 times per day  . pantoprazole  40 mg Oral Daily  . predniSONE  5 mg Oral Q breakfast  . senna-docusate  3 tablet Oral BID  . Sirolimus  1.5 mg Oral q morning - 10a   Continuous Infusions: . sodium chloride 10 mL/hr at 01/31/15 1432  . sodium chloride 10 mL/hr at 01/31/15 1815   PRN Meds:.sodium chloride, sodium chloride, acetaminophen **OR** acetaminophen, alteplase, HYDROcodone-acetaminophen, HYDROmorphone (DILAUDID) injection, lidocaine (PF), lidocaine-prilocaine, methocarbamol **OR** methocarbamol  (ROBAXIN)  IV, metoCLOPramide **OR** metoCLOPramide (REGLAN) injection, ondansetron **OR** ondansetron (ZOFRAN) IV, oxyCODONE, oxyCODONE, pentafluoroprop-tetrafluoroeth  Objective:  Filed Vitals:   02/02/15 0937  BP: 132/45  Pulse: 93  Temp: 97.7 F (36.5 C)  Resp: 18    Body mass index is 21.43 kg/(m^2).  Intake/Output Summary (Last 24 hours) at 02/02/15 1015 Last data filed at 02/02/15 0938  Gross per 24 hour  Intake   1590 ml  Output    870 ml  Net    720 ml    GENERAL:alert, no distress and comfortable. He looks ill SKIN: skin color, texture, turgor are normal, no rashes or significant lesions or petechial rash EYES: normal, conjunctiva are pale and non-injected, sclera clear OROPHARYNX:no exudate, no erythema and lips, buccal mucosa, and tongue normal  NECK: supple, thyroid normal size, non-tender, without nodularity LYMPH: no palpable lymphadenopathy in the cervical, axillary or inguinal LUNGS: clear to auscultation and percussion with normal breathing effort HEART: regular rate & rhythm and 2/6 systolic murmurs and bilateral 1 + lower extremity edema. Mechanical devices are in place. Right thigh AV graft ABDOMEN: soft, non-tender and normal bowel sounds Musculoskeletal:no cyanosis of digits and no clubbing.He is status post left hip surgery PSYCH: alert & oriented x 3 with fluent speech NEURO: no focal motor/sensory deficits   Labs:   Recent Labs Lab 01/30/15 0217 01/30/15 1144 01/30/15 2315 01/31/15 0522 01/31/15 1535 01/31/15 1550 02/01/15 0520 02/01/15 0930 02/02/15 0530  WBC 3.8* 3.0* 2.9* 3.3* 2.7*  --  2.8* 2.3* 2.5*  HGB 7.4* 6.9* 9.4* 9.7* 8.3* 8.8* 7.1* 7.1* 9.7*  HCT 22.8* 21.2* 28.9* 28.5* 25.5* 26.0* 22.0* 21.7* 29.4*  PLT 56* 34* 27* 27* 28*  --  30* 27* 26*  MCV 90.1 88.7 88.7 87.2 89.2  --  90.5 90.8 89.1  MCH 29.2 28.9 28.8 29.7 29.0  --  29.2 29.7 29.4  MCHC 32.5 32.5 32.5 34.0 32.5  --  32.3 32.7 33.0  RDW 15.6* 15.5 15.4 15.7* 15.6*   --  15.7* 15.8* 15.6*  LYMPHSABS 1.6 1.4 1.0  --   --   --   --  0.9  --   MONOABS 0.1 0.2 0.2  --   --   --   --  0.1  --   EOSABS 0.0 0.0 0.0  --   --   --   --  0.0  --   BASOSABS 0.0 0.0 0.0  --   --   --   --  0.0  --      Chemistries:    Recent Labs Lab 01/30/15 0217 01/30/15 1300 01/30/15 2315 01/31/15 0522 01/31/15 1550 02/01/15 0930  NA 135 136 136 138 137 135  K 3.8 3.6 4.6 5.0 4.5 4.7  CL 97* 98* 98* 100*  --  97*  CO2 29 30 30 28   --  30  GLUCOSE 131* 113* 142* 106* 106* 102*  BUN 29* 31* 15 19  --  36*  CREATININE 5.64* 6.24* 3.68* 4.12*  --  5.78*  CALCIUM 8.4* 8.3* 8.0* 8.3*  --  8.2*  AST  --   --  25  --   --   --   ALT  --   --  18  --   --   --   ALKPHOS  --   --  69  --   --   --   BILITOT  --   --  1.9*  --   --   --     GFR Estimated Creatinine Clearance: 12.1 mL/min (by C-G formula based on Cr of 5.78). Liver Function Tests:  Recent Labs Lab 01/30/15 1300 01/30/15 2315 02/01/15 0930  AST  --  25  --   ALT  --  18  --   ALKPHOS  --  69  --   BILITOT  --  1.9*  --   PROT  --  5.1*  --   ALBUMIN 2.2* 2.3* 1.9*    Coagulation profile  Recent Labs Lab 01/27/15 1220 01/30/15 0217 01/30/15 1144 01/30/15 2315  INR 2.24* 2.11* 2.21* 1.57*    Studies:  Dg Hip Operative Unilat With Pelvis Left  01/31/2015   CLINICAL DATA:  Status post ORIF of the left hip foreign intertrochanteric fracture  EXAM: OPERATIVE left HIP (WITH PELVIS IF PERFORMED) single AP portable VIEWS  TECHNIQUE: Fluoroscopic spot image(s) were submitted for interpretation post-operatively.  COMPARISON:  Preoperative study of January 30, 2015  FINDINGS: Reported fluoro time is 10 seconds. The single spot film reveals interval placement of a telescoping nail as well as intra medullary rod. The alignment of the fracture fragments is anatomic.  IMPRESSION: There is no immediate postprocedure complication following ORIF of the left hip.   Electronically Signed   By: David  Martinique  M.D.   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 He will be prone to  develop blood clots. Due to the chronic renal failure status, he does not qualify for any other form of anticoagulation therapy other than Coumadin. His INR range is 2.5-3 and it is working well. He denies any excessive bleeding complication. Recommend holding off addition of aspirin due to bleeding risk from chronic thrombocytopenia, related to his chronic immunosuppressive therapy.  Recommend not to resume warfarin until platelet count is greater than 50,000.  Thrombocytopenia due to drugs This is likely due to immunosuppressive treatment, consumption from recent surgery and mild bone marrow suppression from recent surgery The patient denies recent history of bleeding such as epistaxis, hematuria or hematochezia.  Current platelet count is 27,0000 He is asymptomatic from the low platelet count. He did receive 1 unit of platelets due to orthopedic surgery due to fracture on the left hip requiring repair, and another unit on 9/7 to keep platelet count above 20,000 due to increased risk of bleeding in the setting of hemodialysis and recent bone marrow suppression. He has no response to platelet transfusion yesterday likely due to alloimmunization. For this reason, I recommend only transfusion if bleeding and to keep it over 20,000 Heparin and Coumadin on hold since 9/6 Will continue to monitor.  Anemia in chronic illness This is multifactorial, related to anemia of chronic renal failure, In the setting of recent blood loss due to surgery He is getting erythropoietin stimulating agent through the nephrology office. He receives 200 g every Wednesday  His last transfusion was on 02/01/2015, 2 units for hemoglobin of 7.1 with good response. Continue to transfuse to maintain a hemoglobin above 7, or if the patient is  symptomatic. He is receiving blood transfusion today. Agree with iron supplementation as well. Continue present therapy  Closed Left Hip fracture This is due to a mechanical fall. S/p Left hip surgery on 9/6. He is recuperating well, will need pain control to help patient mobilize.  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  He is asymptomatic.  Continue the same. Will defer to the nephrologist for management of his antirejection medicine.  ESRD on hemodialysis He is getting hemodialysis 3 times a week on Mondays, Wednesdays and Fridays.  He is s/p failed transplant. Appreciate Nephrology follow up.   Heart transplanted The patient is chronically immunosuppressed due to the heart transplant status.  So far, his heart is working well. Continue his chronic immunosuppressive therapy as above  DNR  Other medical issues as per admitting team Will follow next week Rondel Jumbo, PA-C 02/02/2015  10:15 AM Medical Oncology and Hematology [facility]  [place], [place], MD 02/02/2015

## 2015-02-02 NOTE — Evaluation (Signed)
Physical Therapy Evaluation Patient Details Name: Brandon Sharp MRN: [id number] DOB: July 16, 1945 Today's Date: 02/02/2015   History of Present Illness  Patient is a 69 yo male admitted 01/30/15 following fall with Lt hip fx.  Patient s/p IM nailing on 01/31/15.   PMH:  ESRD on HD, DM, HTN, MI, PVD, anemia, ICM, heart transplant  Clinical Impression  Patient presents with problems listed below.  Will benefit from acute PT to maximize mobility prior to discharge.  Patient currently requires total/+2 assist for mobility.  Recommend SNF at discharge for continued therapy.    Follow Up Recommendations SNF;Supervision/Assistance - 24 hour    Equipment Recommendations  3in1 (PT)    Recommendations for Other Services       Precautions / Restrictions Precautions Precautions: Fall Restrictions Weight Bearing Restrictions: Yes LLE Weight Bearing: Weight bearing as tolerated      Mobility  Bed Mobility Overal bed mobility: Needs Assistance;+2 for physical assistance Bed Mobility: Supine to Sit;Sit to Supine     Supine to sit: Total assist Sit to supine: Total assist   General bed mobility comments: Verbal cues for technique.  Encouragement from PT and wife.  Increased time to get patient to agree to work with PT.  Patient yelling out that he is in pain, and then falls asleep during conversation.  Used bed pad to move patients LE's off of bed and hips toward EOB.  Patient became agitated and hit PT.  Calmed patient.  PT and wife slowly able to elevate HOB to help patient raise trunk.  Patient continued to decline to sit EOB, though LE's and hips were in place.  Returned to supine with total assist.  Transfers                    Ambulation/Gait                Stairs            Wheelchair Mobility    Modified Rankin (Stroke Patients Only)       Balance                                             Pertinent Vitals/Pain Pain Assessment:  0-10 Pain Score: 10-Worst pain ever Pain Location: "Lt hip", but moans/yells when PT/wife touches Lt arm or Rt LE. Pain Descriptors / Indicators: Constant;Grimacing;Guarding;Moaning Pain Intervention(s): Premedicated before session;Monitored during session;Repositioned (Patient reports 10/10 pain and then falls asleep while talki)    Home Living Family/patient expects to be discharged to:: Skilled nursing facility Living Arrangements: Spouse/significant other             Home Equipment: Cane - single point;Shower seat;Walker - 2 wheels;Wheelchair - manual      Prior Function Level of Independence: Needs assistance   Gait / Transfers Assistance Needed: Using RW and w/c for mobility.           Hand Dominance        Extremity/Trunk Assessment   Upper Extremity Assessment: Generalized weakness (Noted BUE edema)           Lower Extremity Assessment: Generalized weakness;LLE deficits/detail   LLE Deficits / Details: Patient with decreased strength and ROM due to pain.     Communication   Communication: No difficulties  Cognition Arousal/Alertness: Lethargic;Suspect due to medications (Was given 2 types of pain meds prior to  PT) Behavior During Therapy: Agitated;Anxious (Patient hitting PT during session) Overall Cognitive Status: Impaired/Different from baseline Area of Impairment: Attention;Following commands;Safety/judgement;Awareness;Problem solving   Current Attention Level: Sustained Memory: Decreased short-term memory Following Commands: Follows one step commands inconsistently;Follows one step commands with increased time Safety/Judgement: Decreased awareness of safety;Decreased awareness of deficits   Problem Solving: Slow processing;Decreased initiation;Requires verbal cues;Difficulty sequencing General Comments: Patient states "you are lying to me".  PT and wife assured patient that we are not lying, and are trying to help him.    General Comments       Exercises        Assessment/Plan    PT Assessment Patient needs continued PT services  PT Diagnosis Difficulty walking;Generalized weakness;Acute pain;Altered mental status   PT Problem List Decreased strength;Decreased activity tolerance;Decreased balance;Decreased mobility;Decreased cognition;Decreased knowledge of use of DME;Decreased safety awareness;Cardiopulmonary status limiting activity;Pain  PT Treatment Interventions DME instruction;Gait training;Functional mobility training;Therapeutic activities;Therapeutic exercise;Balance training;Cognitive remediation;Patient/family education   PT Goals (Current goals can be found in the Care Plan section) Acute Rehab PT Goals Patient Stated Goal: "Not to hurt" PT Goal Formulation: With patient/family Time For Goal Achievement: 02/16/15 Potential to Achieve Goals: Fair    Frequency Min 5X/week   Barriers to discharge Decreased caregiver support Do not feel patient's wife would be able to provide current level of care patient requires.    Co-evaluation               End of Session Equipment Utilized During Treatment: Oxygen Activity Tolerance: Patient limited by pain;Patient limited by lethargy;Treatment limited secondary to agitation Patient left: in bed;with call bell/phone within reach;with bed alarm set;with family/visitor present Nurse Communication: Mobility status         Time: 6948-5462 PT Time Calculation (min) (ACUTE ONLY): 47 min   Charges:   PT Evaluation $Initial PT Evaluation Tier I: 1 Procedure PT Treatments $Therapeutic Activity: 23-37 mins   PT G Codes:        Despina Pole February 23, 2015, 8:17 PM Carita Pian. Sanjuana Kava, [place] Pager [phone]

## 2015-02-02 NOTE — Progress Notes (Signed)
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 Presented after a mechanical fall resulting in left hip fracture. He was hospitalized for further management. He underwent open reduction and internal fixation on 9/6. Has severe thrombocytopenia.  Past medical history:  Past Medical History  Diagnosis Date  . Type 2 diabetes mellitus   . Essential hypertension   . Myocardial infarction 1985, 1990  . History of DVT of lower extremity     Left leg  . Anemia   . Blood transfusion   . Peripheral vascular disease   . ESRD (end stage renal disease)     Hemodialysis MWF, Dr. [name], [facility]  . History of DVT (deep vein thrombosis) 08/25/2014    Nearly occluded RIJ and L subclavian DVT  . Ischemic cardiomyopathy     Status post cardiac transplant at [facility]  . Vasculopathy of cardiac allograft     Moderate CAD 09/2013 - Dr. Corky Mull [facility])  . History of pulmonary aspergillosis   . Renal transplant recipient     Failed  . Mobility impaired     Uses walker and wheelchair  . Heart transplant, orthotopic, status     [facility] 2001, LVEF 50-55% 09/2013    Consultants: Orthopedics, cardiology, nephrology, hematology  Procedures:  ORIF 9/6 Hemodialysis  2-D echocardiogram Study Conclusions - Left ventricle: The cavity size was normal. There was severefocal basal hypertrophy of the septum. Systolic function wasnormal. The estimated ejection fraction was in the range of 55%to 60%. Wall motion was normal; there were no regional wallmotion abnormalities.  Doppler parameters are consistent with abnormal left ventricular relaxation (grade 1 diastolicdysfunction). There was no evidence of elevated ventricularfilling pressure by Doppler parameters. - Aortic valve: There was mild stenosis. There was mildregurgitation. Valve area (VTI): 1.84 cm^2. Valve area (Vmax):1.47 cm^2. Valve area (Vmean): 1.49 cm^2. - Aortic root: The aortic root was normal in size. - Left atrium: The atrium was normal in size. - Right ventricle: Systolic function was normal. - Right atrium: The atrium was normal in size. - Tricuspid valve: There was mild regurgitation. - Pulmonary arteries: Systolic pressure was within the normalrange. - Inferior vena cava: The vessel was normal in size. - Pericardium, extracardiac: There was no pericardial effusion.   Antibiotics: None  Subjective: Patient seen while he was getting dialyzed. He has pain in his left hip area, especially with movement. Denies any chest pain or shortness of breath. No episodes of bleeding.  Objective: Vital Signs  Filed Vitals:   02/01/15 1701 02/01/15 2029 02/02/15 0548 02/02/15 0937  BP: 111/51 110/44 156/71 132/45  Pulse: 75 96 102 93  Temp: 99.3 F (37.4 C) 99.3 F (37.4 C) 98.2 F (36.8 C) 97.7 F (36.5 C)  TempSrc: Oral Oral Oral Oral  Resp: 17 18 17 18   Height:      Weight:  71.7 kg (158 lb 1.1 oz)    SpO2: 95% 98% 95% 100%    Intake/Output Summary (Last 24 hours) at 02/02/15 1005 Last data filed at 02/02/15 0938  Gross per 24 hour  Intake   1590 ml  Output    870 ml  Net    720 ml  Filed Weights   02/01/15 0849 02/01/15 1305 02/01/15 2029  Weight: 69.9 kg (154 lb 1.6 oz) 71.3 kg (157 lb 3 oz) 71.7 kg (158 lb 1.1 oz)    General appearance: alert, cooperative, appears stated age and no distress Resp: clear to auscultation bilaterally Cardio: regular rate and rhythm, S1, S2 normal, no murmur, click, rub or gallop GI: soft, non-tender; bowel sounds normal; no masses,   no organomegaly Extremities: No bruising noted in the left hip. Neurologic: No focal deficit  Lab Results:  Basic Metabolic Panel:  Recent Labs Lab 01/30/15 0217 01/30/15 1300 01/30/15 2315 01/31/15 0522 01/31/15 1550 02/01/15 0930  NA 135 136 136 138 137 135  K 3.8 3.6 4.6 5.0 4.5 4.7  CL 97* 98* 98* 100*  --  97*  CO2 29 30 30 28   --  30  GLUCOSE 131* 113* 142* 106* 106* 102*  BUN 29* 31* 15 19  --  36*  CREATININE 5.64* 6.24* 3.68* 4.12*  --  5.78*  CALCIUM 8.4* 8.3* 8.0* 8.3*  --  8.2*  PHOS  --  3.0  --   --   --  5.3*   Liver Function Tests:  Recent Labs Lab 01/30/15 1300 01/30/15 2315 02/01/15 0930  AST  --  25  --   ALT  --  18  --   ALKPHOS  --  69  --   BILITOT  --  1.9*  --   PROT  --  5.1*  --   ALBUMIN 2.2* 2.3* 1.9*   CBC:  Recent Labs Lab 01/30/15 0217 01/30/15 1144 01/30/15 2315 01/31/15 0522 01/31/15 1535 01/31/15 1550 02/01/15 0520 02/01/15 0930 02/02/15 0530  WBC 3.8* 3.0* 2.9* 3.3* 2.7*  --  2.8* 2.3* 2.5*  NEUTROABS 2.1 1.4* 1.7  --   --   --   --  1.2*  --   HGB 7.4* 6.9* 9.4* 9.7* 8.3* 8.8* 7.1* 7.1* 9.7*  HCT 22.8* 21.2* 28.9* 28.5* 25.5* 26.0* 22.0* 21.7* 29.4*  MCV 90.1 88.7 88.7 87.2 89.2  --  90.5 90.8 89.1  PLT 56* 34* 27* 27* 28*  --  30* 27* 26*   CBG:  Recent Labs Lab 02/01/15 0604 02/01/15 1355 02/01/15 1816 02/02/15 0017 02/02/15 0542  GLUCAP 92 74 81 99 103*    Recent Results (from the past 240 hour(s))  MRSA PCR Screening     Status: None   Collection Time: 01/30/15  8:45 AM  Result Value Ref Range Status   MRSA by PCR NEGATIVE NEGATIVE Final    Comment:        The GeneXpert MRSA Assay (FDA approved for NASAL specimens only), is one component of a comprehensive MRSA colonization surveillance program. It is not intended to diagnose MRSA infection nor to guide or monitor treatment for MRSA infections.       Studies/Results: Dg Hip Operative Unilat With Pelvis Left  01/31/2015   CLINICAL DATA:   Status post ORIF of the left hip foreign intertrochanteric fracture  EXAM: OPERATIVE left HIP (WITH PELVIS IF PERFORMED) single AP portable VIEWS  TECHNIQUE: Fluoroscopic spot image(s) were submitted for interpretation post-operatively.  COMPARISON:  Preoperative study of January 30, 2015  FINDINGS: Reported fluoro time is 10 seconds. The single spot film reveals interval placement of a telescoping nail as well as intra medullary rod. The alignment of the fracture fragments is anatomic.  IMPRESSION: There is no immediate postprocedure complication following ORIF of the left hip.   Electronically Signed  By: David  Martinique M.D.   On: 01/31/2015 16:18    Medications:  Scheduled: . sodium chloride   Intravenous Once  . sodium chloride   Intravenous Once  . antiseptic oral rinse  7 mL Mouth Rinse BID  . atorvastatin  40 mg Oral QPM  . azaTHIOprine  75 mg Oral q morning - 10a  . calcium acetate  667 mg Oral BID WC  . carvedilol  6.25 mg Oral QHS  . colesevelam  1,875 mg Oral BID WC  . darbepoetin (ARANESP) injection - DIALYSIS  200 mcg Intravenous Q Wed-HD  . doxercalciferol  3 mcg Intravenous Q M,W,F-HD  . feeding supplement  1 Container Oral BID BM  . feeding supplement (PRO-STAT SUGAR FREE 64)  30 mL Oral Q1500  .  HYDROmorphone (DILAUDID) injection  1 mg Intramuscular Once  . Influenza vac split quadrivalent PF  0.5 mL Intramuscular Tomorrow-1000  . insulin aspart  0-9 Units Subcutaneous Q6H  . isosorbide dinitrate  40 mg Oral 3 times per day  . pantoprazole  40 mg Oral Daily  . predniSONE  5 mg Oral Q breakfast  . senna-docusate  3 tablet Oral BID  . Sirolimus  1.5 mg Oral q morning - 10a   Continuous: . sodium chloride 10 mL/hr at 01/31/15 1432  . sodium chloride 10 mL/hr at 01/31/15 1815   CXK:GYJEHU chloride, sodium chloride, acetaminophen **OR** acetaminophen, alteplase, HYDROcodone-acetaminophen, HYDROmorphone (DILAUDID) injection, lidocaine (PF), lidocaine-prilocaine,  methocarbamol **OR** methocarbamol (ROBAXIN)  IV, metoCLOPramide **OR** metoCLOPramide (REGLAN) injection, ondansetron **OR** ondansetron (ZOFRAN) IV, oxyCODONE, oxyCODONE, pentafluoroprop-tetrafluoroeth  Assessment/Plan:  Principal Problem:   Closed left hip fracture Active Problems:   Heart transplanted   S/P CABG (coronary artery bypass graft)   DM (diabetes mellitus), type 2 with renal complications   ESRD on hemodialysis   Immunosuppressed status   Kidney transplant failure   Protein-calorie malnutrition, severe   Thrombocytopenia   Anemia due to chronic kidney disease   Essential hypertension   Chronic anemia   Chronic renal failure   Malnutrition of moderate degree   History of DVT (deep vein thrombosis)   Fracture, intertrochanteric, left femur     Closed left hip fracture This was due to a mechanical fall. Patient was seen by orthopedics. Patient status post ORIF. Pain management. PT and OT to evaluate. Seen by cardiology for clearance. 2D echocardiogram was ordered here, pending. Last documented echo from 2015 at [facility] with EF 60-65%, mild AR. Pt is s/p cath in 09/2013 at [facility] with mod non-obstructive disease  Pancytopenia with severe thrombocytopenia and anemia This is thought to be secondary to patient's immunosuppressive agents. Platelet counts are much lower than his baseline, which is between 50 and 100. Patient had seen hematology in the past. Discussed with Dr. Alvy Bimler who has seen the patient. Patient was transfused 2 units of packed red blood cells 9/8. Also transfused 1 unit of platelet. Discussed with nephrologist, Dr. Jonnie Finner who discussed patient's condition with his cardiologist at [facility]. They have recommended changes to his immunosuppressive medications. This includes holding the Imuran. Increasing the prednisone and decreasing the dose of sirolimus. If there is no improvement with this strategy in the next 2 days, patient may have to be transferred over  to that facility for further management.   History of DVT Patient was on Coumadin at home. However, now his platelet counts are significantly lower than his baseline. Per hematology hold all forms of anticoagulation for now as the risk of bleeding is much high  compared to benefits.  ESRD on hemodialysis. Monday, Wednesday, Friday. S/p failed renal transplant. Nephrology is following.  Diabetes mellitus 2 with renal complications Continuing sliding scale insulin.  Constipation. Improved with Senokot  Recent groin infection. Patient completed a course of vancomycin.  History of Cardiac and renal transplant Continue immunosuppressive medication with changes as discussed above.  DVT Prophylaxis: Teds    Code Status: DO NOT RESUSCITATE  Family Communication: Discussed with the patient. Also discussed with the patient's wife. Disposition Plan: Will need SNF when ready for discharge.    LOS: 3 days   [facility] Pager [phone] 02/02/2015, 10:05 AM  If 7PM-7AM, please contact night-coverage at [email or web address], password [email or web address]

## 2015-02-02 NOTE — Progress Notes (Signed)
[facility] Progress Note  Assessment/Plan: 1. Left hip fx - s/p surgery 9/6  Dr. Sharol Given; I do not think he is safe to go home and has not been able to work with PT yet due to pain.  He may need short term Rehab; especially given low platelets/weakness/reent surgery he is a huge fall risk and needs a supervised safe environment 2. ESRD -MWF- HD today - no heparin, next HD Friday 3. Anemia - recent transfusion; max ESA; Hgb 9.7 down to 7.1 - transfused 2 units PRBC on HD Wed up to 9.7 Thursday  Repeat Friday 4. Secondary hyperparathyroidism - hectorol P 5.3 5. HTN/volume - UF only 270 Monday -870 Wed with post bed weight of 71.7 up 3 kg from Monday post wt; BP up attempt to UF more volume on Friday as BP allows 6. Nutrition - renal/carb mod diet/ supplements and prostat 7. DM            8. 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 Heart Tx 2001- current meds 10. Thrombocytopenia - platelets low down to 26 this am - had platelets during surgery yesterday - Heme following - advises no coumadin until plts >50, off heparin for now 11. AMS - slightly paranoid today expressive feelings that we are holding back information from him.  Myriam Jacobson, PA-C [facility] Beeper [phone] 02/02/2015,11:12 AM  LOS: 3 days   Pt seen, examined and agree w A/P as above. WBC and plts remain low, will d/w cardiac transplant team at [facility], perhaps we can adjust his immunosuppression. Recovering from hip surgery, still sig pain.   Kelly Splinter MD pager [phone]    cell [phone] 02/02/2015, 12:11 PM    Subjective:   "why is everyone being secretive?" feels like he will be able to go home but also says he has not been up and walking  Objective Filed Vitals:   02/01/15 1701 02/01/15 2029 02/02/15 0548 02/02/15 0937  BP: 111/51 110/44 156/71 132/45  Pulse: 75 96 102 93  Temp: 99.3 F (37.4 C) 99.3 F (37.4 C) 98.2 F (36.8 C) 97.7 F (36.5 C)  TempSrc: Oral Oral Oral Oral   Resp: 17 18 17 18   Height:      Weight:  71.7 kg (158 lb 1.1 oz)    SpO2: 95% 98% 95% 100%   Physical Exam General: reserved NAD Heart: RRR Lungs: no rales Abdomen: soft NT Extremities:+ LLE edema Dialysis Access: right thigh AVGG+ bruit  Dialysis Orders: 4h 68kg 2/2 bath Heparin none R thigh AVG Aranesp 200/ wed Hect 3 ug tiw  Additional Objective Labs: Basic Metabolic Panel:  Recent Labs Lab 01/30/15 1300 01/30/15 2315 01/31/15 0522 01/31/15 1550 02/01/15 0930  NA 136 136 138 137 135  K 3.6 4.6 5.0 4.5 4.7  CL 98* 98* 100*  --  97*  CO2 30 30 28   --  30  GLUCOSE 113* 142* 106* 106* 102*  BUN 31* 15 19  --  36*  CREATININE 6.24* 3.68* 4.12*  --  5.78*  CALCIUM 8.3* 8.0* 8.3*  --  8.2*  PHOS 3.0  --   --   --  5.3*   Liver Function Tests:  Recent Labs Lab 01/30/15 1300 01/30/15 2315 02/01/15 0930  AST  --  25  --   ALT  --  18  --   ALKPHOS  --  69  --   BILITOT  --  1.9*  --   PROT  --  5.1*  --   ALBUMIN 2.2* 2.3* 1.9*   No results for input(s): LIPASE, AMYLASE in the last 168 hours. CBC:  Recent Labs Lab 01/30/15 1144 01/30/15 2315 01/31/15 0522 01/31/15 1535  02/01/15 0520 02/01/15 0930 02/02/15 0530  WBC 3.0* 2.9* 3.3* 2.7*  --  2.8* 2.3* 2.5*  NEUTROABS 1.4* 1.7  --   --   --   --  1.2*  --   HGB 6.9* 9.4* 9.7* 8.3*  < > 7.1* 7.1* 9.7*  HCT 21.2* 28.9* 28.5* 25.5*  < > 22.0* 21.7* 29.4*  MCV 88.7 88.7 87.2 89.2  --  90.5 90.8 89.1  PLT 34* 27* 27* 28*  --  30* 27* 26*  < > = values in this interval not displayed. Blood Culture    Component Value Date/Time   SDES CATH TIP 12/28/2014 1535   SPECREQUEST LEFT IJ 12/28/2014 1535   CULT  12/28/2014 1535    NO GROWTH 2 DAYS Performed at [place] 12/31/2014 FINAL 12/28/2014 1535    Cardiac Enzymes: No results for input(s): CKTOTAL, CKMB, CKMBINDEX, TROPONINI in the last 168 hours. CBG:  Recent Labs Lab 02/01/15 0604 02/01/15 1355 02/01/15 1816  02/02/15 0017 02/02/15 0542  GLUCAP 92 74 81 99 103*   Iron Studies: No results for input(s): IRON, TIBC, TRANSFERRIN, FERRITIN in the last 72 hours. @lablastinr3 @ Studies/Results: Dg Hip Operative Unilat With Pelvis Left  01/31/2015   CLINICAL DATA:  Status post ORIF of the left hip foreign intertrochanteric fracture  EXAM: OPERATIVE left HIP (WITH PELVIS IF PERFORMED) single AP portable VIEWS  TECHNIQUE: Fluoroscopic spot image(s) were submitted for interpretation post-operatively.  COMPARISON:  Preoperative study of January 30, 2015  FINDINGS: Reported fluoro time is 10 seconds. The single spot film reveals interval placement of a telescoping nail as well as intra medullary rod. The alignment of the fracture fragments is anatomic.  IMPRESSION: There is no immediate postprocedure complication following ORIF of the left hip.   Electronically Signed   By: David  Martinique M.D.   On: 01/31/2015 16:18   Medications: . sodium chloride 10 mL/hr at 01/31/15 1432  . sodium chloride 10 mL/hr at 01/31/15 1815   . sodium chloride   Intravenous Once  . sodium chloride   Intravenous Once  . antiseptic oral rinse  7 mL Mouth Rinse BID  . atorvastatin  40 mg Oral QPM  . azaTHIOprine  75 mg Oral q morning - 10a  . calcium acetate  667 mg Oral BID WC  . carvedilol  6.25 mg Oral QHS  . colesevelam  1,875 mg Oral BID WC  . darbepoetin (ARANESP) injection - DIALYSIS  200 mcg Intravenous Q Wed-HD  . doxercalciferol  3 mcg Intravenous Q M,W,F-HD  . feeding supplement  1 Container Oral BID BM  . feeding supplement (PRO-STAT SUGAR FREE 64)  30 mL Oral Q1500  .  HYDROmorphone (DILAUDID) injection  1 mg Intramuscular Once  . Influenza vac split quadrivalent PF  0.5 mL Intramuscular Tomorrow-1000  . insulin aspart  0-9 Units Subcutaneous Q6H  . isosorbide dinitrate  40 mg Oral 3 times per day  . pantoprazole  40 mg Oral Daily  . predniSONE  5 mg Oral Q breakfast  . senna-docusate  3 tablet Oral BID  .  Sirolimus  1.5 mg Oral q morning - 10a

## 2015-02-02 NOTE — Clinical Social Work Note (Signed)
CSW received consult for SNF placement. Talked to wife at the bedside today regarding discharge planning (full assessment to follow) and Mrs. Butzer in agreement. Bed search will be initiated.  Ashly Goethe Givens, MSW, LCSW Licensed Clinical Social Worker [place] [phone]

## 2015-02-03 LAB — RENAL FUNCTION PANEL
Albumin: 1.8 g/dL — ABNORMAL LOW (ref 3.5–5.0)
Anion gap: 8 (ref 5–15)
BUN: 32 mg/dL — ABNORMAL HIGH (ref 6–20)
CO2: 29 mmol/L (ref 22–32)
Calcium: 8.3 mg/dL — ABNORMAL LOW (ref 8.9–10.3)
Chloride: 98 mmol/L — ABNORMAL LOW (ref 101–111)
Creatinine, Ser: 5.38 mg/dL — ABNORMAL HIGH (ref 0.61–1.24)
GFR calc Af Amer: 11 mL/min — ABNORMAL LOW (ref >60)
GFR calc non Af Amer: 10 mL/min — ABNORMAL LOW (ref >60)
Glucose, Bld: 148 mg/dL — ABNORMAL HIGH (ref 65–99)
Phosphorus: 3.9 mg/dL (ref 2.5–4.6)
Potassium: 3.8 mmol/L (ref 3.5–5.1)
Sodium: 135 mmol/L (ref 135–145)

## 2015-02-03 LAB — CBC
HEMATOCRIT: 28.9 % — AB (ref 39.0–52.0)
HEMOGLOBIN: 9.2 g/dL — AB (ref 13.0–17.0)
MCH: 29.3 pg (ref 26.0–34.0)
MCHC: 31.8 g/dL (ref 30.0–36.0)
MCV: 92 fL (ref 78.0–100.0)
Platelets: 22 10*3/uL — CL (ref 150–400)
RBC: 3.14 MIL/uL — AB (ref 4.22–5.81)
RDW: 15.7 % — ABNORMAL HIGH (ref 11.5–15.5)
WBC: 2.5 10*3/uL — AB (ref 4.0–10.5)

## 2015-02-03 LAB — CBC WITH DIFFERENTIAL/PLATELET
Basophils Absolute: 0 10*3/uL (ref 0.0–0.1)
Basophils Relative: 0 % (ref 0–1)
Eosinophils Absolute: 0 10*3/uL (ref 0.0–0.7)
Eosinophils Relative: 0 % (ref 0–5)
HCT: 25.6 % — ABNORMAL LOW (ref 39.0–52.0)
Hemoglobin: 8.1 g/dL — ABNORMAL LOW (ref 13.0–17.0)
Lymphocytes Relative: 37 % (ref 12–46)
Lymphs Abs: 0.9 10*3/uL (ref 0.7–4.0)
MCH: 29 pg (ref 26.0–34.0)
MCHC: 31.6 g/dL (ref 30.0–36.0)
MCV: 91.8 fL (ref 78.0–100.0)
Monocytes Absolute: 0.2 10*3/uL (ref 0.1–1.0)
Monocytes Relative: 6 % (ref 3–12)
Neutro Abs: 1.4 10*3/uL — ABNORMAL LOW (ref 1.7–7.7)
Neutrophils Relative %: 57 % (ref 43–77)
Platelets: 23 10*3/uL — CL (ref 150–400)
RBC: 2.79 MIL/uL — ABNORMAL LOW (ref 4.22–5.81)
RDW: 15.5 % (ref 11.5–15.5)
WBC: 2.4 10*3/uL — ABNORMAL LOW (ref 4.0–10.5)

## 2015-02-03 LAB — GLUCOSE, CAPILLARY
GLUCOSE-CAPILLARY: 125 mg/dL — AB (ref 65–99)
GLUCOSE-CAPILLARY: 141 mg/dL — AB (ref 65–99)

## 2015-02-03 MED ORDER — SODIUM CHLORIDE 0.9 % IV SOLN: Freq: Once | INTRAVENOUS | Status: DC

## 2015-02-03 NOTE — Progress Notes (Signed)
PT Cancellation Note  Patient Details Name: Brandon Sharp MRN: [id number] DOB: 1946-03-04   Cancelled Treatment:    Reason Eval/Treat Not Completed: Patient at procedure or test/unavailable;Other (comment) Attempted to see patient x3 today.  Patient in HD x2.  Patient now awaiting transfer to [place].    Despina Pole 02/03/2015, 5:13 PM Carita Pian. Sanjuana Kava, [place] Pager [phone]

## 2015-02-03 NOTE — Progress Notes (Signed)
Patient ID: Brandon Sharp, male   DOB: May 10, 1946, 69 y.o.   MRN: [id number] Patient is resting comfortably this morning. Status post intertrochanteric fracture with placement of trochanteric nail. Plan for weight bearing as tolerated discharge to skilled nursing, I will follow-up in 2 weeks.

## 2015-02-03 NOTE — Progress Notes (Signed)
Subjective:   Pain limiting PT. Otherwise says feeling well.   Objective Filed Vitals:   02/02/15 1707 02/02/15 2109 02/03/15 0424 02/03/15 0806  BP: 105/41 95/38 113/44 137/50  Pulse: 93 85 86 80  Temp: 97.9 F (36.6 C) 98.9 F (37.2 C) 98.7 F (37.1 C) 97.5 F (36.4 C)  TempSrc: Oral Oral Oral Oral  Resp: 18 17 18 17   Height:      Weight:  71.8 kg (158 lb 4.6 oz)    SpO2: 99% 98% 90% 100%   Physical Exam General: alert and oriented. Very pleasant this AM.  Heart:  RRR Lungs: CTA, unlabored  Abdomen: soft, nontender +BS  Extremities: LLE edema and RUE edema. L hip dressing intact Dialysis Access:  R thigh AVG +b/t  Dialysis Orders: 4h 68kg 2/2 bath Heparin none R thigh AVG Aranesp 200/ wed Hect 3 ug tiw  Assessment/Plan: 1. Left hip fx - s/p surgery 9/6pain limiting PT. Plan for SNF 2. ESRD -MWF- HD pending today - no heparin,  3. Anemia - recent transfusion; max ESA; Hgb 9.7 down to 7.1 - transfused 2 units PRBC on HD Wed up to 9.7- 9.2 this AM 4. Secondary hyperparathyroidism - hectorol P 5.3 5. HTN/volume -  BP controlled. up 3 kg from Monday post wt;  attempt to UF more volume on Friday as BP allows 6. Nutrition - renal/carb mod diet/ supplements and prostat 7. DM  8. 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 Heart Tx 2001- Imuran on hold. Prednisone increased to 7.5mg s 10. Thrombocytopenia - platelets low down to 22 this am - had platelets during surgery and recommend 1 u platlets in HD today. Heme following - advises no coumadin until plts >50, off heparin for now- considering transfer to [place] 11. AMS - baseline this AM. Very pleasant and appropriate   Shelle Iron, NP [place] [phone] 02/03/2015,10:05 AM  LOS: 4 days   Pt seen, examined and agree w A/P as above.  Kelly Splinter MD pager [phone]    cell [phone] 02/03/2015, 12:37 PM     Additional Objective Labs: Basic Metabolic Panel:  Recent Labs Lab  01/30/15 1300  01/31/15 0522 01/31/15 1550 02/01/15 0930 02/02/15 2054  NA 136  < > 138 137 135 135  K 3.6  < > 5.0 4.5 4.7 3.9  CL 98*  < > 100*  --  97* 97*  CO2 30  < > 28  --  30 30  GLUCOSE 113*  < > 106* 106* 102* 143*  BUN 31*  < > 19  --  36* 25*  CREATININE 6.24*  < > 4.12*  --  5.78* 4.56*  CALCIUM 8.3*  < > 8.3*  --  8.2* 8.4*  PHOS 3.0  --   --   --  5.3* 3.7  < > = values in this interval not displayed. Liver Function Tests:  Recent Labs Lab 01/30/15 2315 02/01/15 0930 02/02/15 2054  AST 25  --   --   ALT 18  --   --   ALKPHOS 69  --   --   BILITOT 1.9*  --   --   PROT 5.1*  --   --   ALBUMIN 2.3* 1.9* 1.9*   No results for input(s): LIPASE, AMYLASE in the last 168 hours. CBC:  Recent Labs Lab 01/30/15 1144 01/30/15 2315  02/01/15 0520 02/01/15 0930 02/02/15 0530 02/02/15 2054 02/03/15 0654  WBC 3.0* 2.9*  < > 2.8* 2.3* 2.5*  2.1* 2.5*  NEUTROABS 1.4* 1.7  --   --  1.2*  --   --   --   HGB 6.9* 9.4*  < > 7.1* 7.1* 9.7* 9.0* 9.2*  HCT 21.2* 28.9*  < > 22.0* 21.7* 29.4* 27.5* 28.9*  MCV 88.7 88.7  < > 90.5 90.8 89.1 91.4 92.0  PLT 34* 27*  < > 30* 27* 26* 20* 22*  < > = values in this interval not displayed. Blood Culture    Component Value Date/Time   SDES CATH TIP 12/28/2014 1535   SPECREQUEST LEFT IJ 12/28/2014 1535   CULT  12/28/2014 1535    NO GROWTH 2 DAYS Performed at [place] 12/31/2014 FINAL 12/28/2014 1535    Cardiac Enzymes: No results for input(s): CKTOTAL, CKMB, CKMBINDEX, TROPONINI in the last 168 hours. CBG:  Recent Labs Lab 02/02/15 0542 02/02/15 1207 02/02/15 1815 02/03/15 0021 02/03/15 0603  GLUCAP 103* 145* 163* 141* 125*   Iron Studies: No results for input(s): IRON, TIBC, TRANSFERRIN, FERRITIN in the last 72 hours. @lablastinr3 @ Studies/Results: No results found. Medications: . sodium chloride 10 mL/hr at 01/31/15 1432  . sodium chloride 10 mL/hr at 01/31/15 1815   . sodium  chloride   Intravenous Once  . antiseptic oral rinse  7 mL Mouth Rinse BID  . atorvastatin  40 mg Oral QPM  . calcium acetate  667 mg Oral BID WC  . carvedilol  6.25 mg Oral QHS  . colesevelam  1,875 mg Oral BID WC  . darbepoetin (ARANESP) injection - DIALYSIS  200 mcg Intravenous Q Wed-HD  . doxercalciferol  3 mcg Intravenous Q M,W,F-HD  . feeding supplement  1 Container Oral BID BM  . feeding supplement (PRO-STAT SUGAR FREE 64)  30 mL Oral Q1500  .  HYDROmorphone (DILAUDID) injection  1 mg Intramuscular Once  . Influenza vac split quadrivalent PF  0.5 mL Intramuscular Tomorrow-1000  . insulin aspart  0-9 Units Subcutaneous Q6H  . isosorbide dinitrate  40 mg Oral 3 times per day  . pantoprazole  40 mg Oral Daily  . predniSONE  7.5 mg Oral Q breakfast  . senna-docusate  3 tablet Oral BID  . Sirolimus  0.5 mg Oral q morning - 10a

## 2015-02-03 NOTE — Progress Notes (Signed)
Spoke with Dr. Bonnielee Haff, [facility].  Dr. Alvy Bimler, Hematology, recommends:  1 unit of platelets during hemodialysis. Suspect it would drop further after HD Consider transfer to [facility] due to the complexity of his case. Dr. [place] Pink kindly agreed with the plan. Thank you for the opportunity to participate in the care of this nice patient, and to the outstanding care provided by the primary team and multispecialties.   Sharene Butters, PA-C [place], [place], MD 02/03/2015

## 2015-02-03 NOTE — Progress Notes (Signed)
Report called to [facility] RN, [facility] at bedside to transfer pt. Vitals stable.

## 2015-02-03 NOTE — Progress Notes (Signed)
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 Presented after a mechanical fall resulting in left hip fracture. He was hospitalized for further management. He underwent open reduction and internal fixation on 9/6. Has severe thrombocytopenia.  Past medical history:  Past Medical History  Diagnosis Date  . Type 2 diabetes mellitus   . Essential hypertension   . Myocardial infarction 1985, 1990  . History of DVT of lower extremity     Left leg  . Anemia   . Blood transfusion   . Peripheral vascular disease   . ESRD (end stage renal disease)     Hemodialysis MWF, Dr. [name], [facility]  . History of DVT (deep vein thrombosis) 08/25/2014    Nearly occluded RIJ and L subclavian DVT  . Ischemic cardiomyopathy     Status post cardiac transplant at [facility]  . Vasculopathy of cardiac allograft     Moderate CAD 09/2013 - Dr. Corky Mull [facility])  . History of pulmonary aspergillosis   . Renal transplant recipient     Failed  . Mobility impaired     Uses walker and wheelchair  . Heart transplant, orthotopic, status     [facility] 2001, LVEF 50-55% 09/2013    Consultants: Orthopedics, cardiology, nephrology, hematology  Procedures:  ORIF 9/6 Hemodialysis  2-D echocardiogram Study Conclusions - Left ventricle: The cavity size was normal. There was severefocal basal hypertrophy of the septum. Systolic function wasnormal. The estimated ejection fraction was in the range of 55%to 60%. Wall motion was normal; there were no regional wallmotion abnormalities.  Doppler parameters are consistent with abnormal left ventricular relaxation (grade 1 diastolicdysfunction). There was no evidence of elevated ventricularfilling pressure by Doppler parameters. - Aortic valve: There was mild stenosis. There was mildregurgitation. Valve area (VTI): 1.84 cm^2. Valve area (Vmax):1.47 cm^2. Valve area (Vmean): 1.49 cm^2. - Aortic root: The aortic root was normal in size. - Left atrium: The atrium was normal in size. - Right ventricle: Systolic function was normal. - Right atrium: The atrium was normal in size. - Tricuspid valve: There was mild regurgitation. - Pulmonary arteries: Systolic pressure was within the normalrange. - Inferior vena cava: The vessel was normal in size. - Pericardium, extracardiac: There was no pericardial effusion.   Antibiotics: None  Subjective: Patient has some pain in his left leg, especially with movement. Denies any nausea, vomiting. No chest pain, no shortness of breath. No episodes of bleeding.  Objective: Vital Signs  Filed Vitals:   02/02/15 1707 02/02/15 2109 02/03/15 0424 02/03/15 0806  BP: 105/41 95/38 113/44 137/50  Pulse: 93 85 86 80  Temp: 97.9 F (36.6 C) 98.9 F (37.2 C) 98.7 F (37.1 C) 97.5 F (36.4 C)  TempSrc: Oral Oral Oral Oral  Resp: 18 17 18 17   Height:      Weight:  71.8 kg (158 lb 4.6 oz)    SpO2: 99% 98% 90% 100%    Intake/Output Summary (Last 24 hours) at 02/03/15 0955 Last data filed at 02/03/15 0823  Gross per 24 hour  Intake    320 ml  Output      0 ml  Net    320 ml  Filed Weights   02/01/15 1305 02/01/15 2029 02/02/15 2109  Weight: 71.3 kg (157 lb 3 oz) 71.7 kg (158 lb 1.1 oz) 71.8 kg (158 lb 4.6 oz)    General appearance: alert, cooperative,  no distress Resp: clear to auscultation bilaterally Cardio: regular rate and rhythm, S1, S2 normal, no murmur, click, rub or gallop GI: soft, non-tender; bowel sounds normal; no masses,  no organomegaly Extremities: No bruising  noted in the left hip. Neurologic: No focal deficit  Lab Results:  Basic Metabolic Panel:  Recent Labs Lab 01/30/15 1300 01/30/15 2315 01/31/15 0522 01/31/15 1550 02/01/15 0930 02/02/15 2054  NA 136 136 138 137 135 135  K 3.6 4.6 5.0 4.5 4.7 3.9  CL 98* 98* 100*  --  97* 97*  CO2 30 30 28   --  30 30  GLUCOSE 113* 142* 106* 106* 102* 143*  BUN 31* 15 19  --  36* 25*  CREATININE 6.24* 3.68* 4.12*  --  5.78* 4.56*  CALCIUM 8.3* 8.0* 8.3*  --  8.2* 8.4*  PHOS 3.0  --   --   --  5.3* 3.7   Liver Function Tests:  Recent Labs Lab 01/30/15 1300 01/30/15 2315 02/01/15 0930 02/02/15 2054  AST  --  25  --   --   ALT  --  18  --   --   ALKPHOS  --  69  --   --   BILITOT  --  1.9*  --   --   PROT  --  5.1*  --   --   ALBUMIN 2.2* 2.3* 1.9* 1.9*   CBC:  Recent Labs Lab 01/30/15 0217 01/30/15 1144 01/30/15 2315  02/01/15 0520 02/01/15 0930 02/02/15 0530 02/02/15 2054 02/03/15 0654  WBC 3.8* 3.0* 2.9*  < > 2.8* 2.3* 2.5* 2.1* 2.5*  NEUTROABS 2.1 1.4* 1.7  --   --  1.2*  --   --   --   HGB 7.4* 6.9* 9.4*  < > 7.1* 7.1* 9.7* 9.0* 9.2*  HCT 22.8* 21.2* 28.9*  < > 22.0* 21.7* 29.4* 27.5* 28.9*  MCV 90.1 88.7 88.7  < > 90.5 90.8 89.1 91.4 92.0  PLT 56* 34* 27*  < > 30* 27* 26* 20* 22*  < > = values in this interval not displayed. CBG:  Recent Labs Lab 02/02/15 0542 02/02/15 1207 02/02/15 1815 02/03/15 0021 02/03/15 0603  GLUCAP 103* 145* 163* 141* 125*    Recent Results (from the past 240 hour(s))  MRSA PCR Screening     Status: None   Collection Time: 01/30/15  8:45 AM  Result Value Ref Range Status   MRSA by PCR NEGATIVE NEGATIVE Final    Comment:        The GeneXpert MRSA Assay (FDA approved for NASAL specimens only), is one component of a comprehensive MRSA colonization surveillance program. It is not intended to diagnose MRSA infection nor to guide or monitor treatment for MRSA infections.       Studies/Results: No results  found.  Medications:  Scheduled: . sodium chloride   Intravenous Once  . antiseptic oral rinse  7 mL Mouth Rinse BID  . atorvastatin  40 mg Oral QPM  . calcium acetate  667 mg Oral BID WC  . carvedilol  6.25 mg Oral QHS  . colesevelam  1,875 mg Oral BID WC  . darbepoetin (ARANESP) injection - DIALYSIS  200 mcg Intravenous Q Wed-HD  . doxercalciferol  3 mcg Intravenous Q M,W,F-HD  .  feeding supplement  1 Container Oral BID BM  . feeding supplement (PRO-STAT SUGAR FREE 64)  30 mL Oral Q1500  .  HYDROmorphone (DILAUDID) injection  1 mg Intramuscular Once  . Influenza vac split quadrivalent PF  0.5 mL Intramuscular Tomorrow-1000  . insulin aspart  0-9 Units Subcutaneous Q6H  . isosorbide dinitrate  40 mg Oral 3 times per day  . pantoprazole  40 mg Oral Daily  . predniSONE  7.5 mg Oral Q breakfast  . senna-docusate  3 tablet Oral BID  . Sirolimus  0.5 mg Oral q morning - 10a   Continuous: . sodium chloride 10 mL/hr at 01/31/15 1432  . sodium chloride 10 mL/hr at 01/31/15 1815   STM:HDQQIW chloride, sodium chloride, acetaminophen **OR** acetaminophen, alteplase, HYDROcodone-acetaminophen, HYDROmorphone (DILAUDID) injection, lidocaine (PF), lidocaine-prilocaine, methocarbamol **OR** methocarbamol (ROBAXIN)  IV, metoCLOPramide **OR** metoCLOPramide (REGLAN) injection, ondansetron **OR** ondansetron (ZOFRAN) IV, oxyCODONE, oxyCODONE, pentafluoroprop-tetrafluoroeth  Assessment/Plan:  Principal Problem:   Closed left hip fracture Active Problems:   Heart transplanted   S/P CABG (coronary artery bypass graft)   DM (diabetes mellitus), type 2 with renal complications   ESRD on hemodialysis   Immunosuppressed status   Kidney transplant failure   Protein-calorie malnutrition, severe   Thrombocytopenia   Anemia due to chronic kidney disease   Essential hypertension   Chronic anemia   Chronic renal failure   Malnutrition of moderate degree   History of DVT (deep vein thrombosis)    Fracture, intertrochanteric, left femur     Closed left hip fracture This was due to a mechanical fall. Patient was seen by orthopedics. Patient status post ORIF. Pain management. PT and OT.   Pancytopenia with severe thrombocytopenia and anemia This is thought to be secondary to patient's immunosuppressive agents. Platelet counts are much lower than his baseline, which is between 50 and 100. Patient had seen hematology in the past. Hematology is following closely. They recommend another unit of platelet transfusion today. Patient has received multiple units of packed red cells as well as platelets. There is some evidence for alloimmunization as her counts are not improving despite transfusion. Nephrologist, Dr. Jonnie Finner, discussed patient's condition with his cardiologist at [facility]. They recommended changes to his immunosuppressive medications. This includes holding the Imuran. Increasing the prednisone and decreasing the dose of sirolimus. Counts remained low. Hematology recommends transfer to [facility] Have spoken to the cardiac transplant team and they have accepted to the patient in transfer. However, [facility], does not have any beds currently.    History of DVT Patient was on Coumadin at home. However, now his platelet counts are significantly lower than his baseline. Per hematology hold all forms of anticoagulation for now as the risk of bleeding is much high compared to benefits.  History of Cardiac and renal transplant Continue immunosuppressive medication with changes as discussed above. Was seen by cardiology preoperatively. 2D echocardiogram as above. Last documented echo from 2015 at [facility] with EF 60-65%, mild AR. Pt is s/p cath in 09/2013 at [facility] with mod non-obstructive disease  ESRD on hemodialysis. Monday, Wednesday, Friday. S/p failed renal transplant. Nephrology is following.  Diabetes mellitus 2 with renal complications Continuing sliding scale  insulin.  Constipation. Improved with Senokot  Recent groin infection. Patient completed a course of vancomycin.   DVT Prophylaxis: Teds    Code Status: DO NOT RESUSCITATE  Family Communication: Discussed with the patient. Also discussed with the patient's wife. Disposition Plan: Await transfer to [facility]. To be dialyzed today.  LOS: 4 days   [facility] Pager [phone] 02/03/2015, 9:55 AM  If 7PM-7AM, please contact night-coverage at [email or web address], password [facility]

## 2015-02-03 NOTE — Discharge Instructions (Signed)
.  tom

## 2015-02-03 NOTE — Discharge Summary (Signed)
[facility]  Physician Discharge Summary   Patient ID: Brandon Sharp MRN: [id number] DOB/AGE: November 24, 1945 69 y.o.  Admit date: 01/30/2015 Discharge date: 02/03/2015  PCP: [name], MD  DISCHARGE DIAGNOSES:  Principal Problem:   Closed left hip fracture Active Problems:   Heart transplanted   S/P CABG (coronary artery bypass graft)   DM (diabetes mellitus), type 2 with renal complications   ESRD on hemodialysis   Immunosuppressed status   Kidney transplant failure   Protein-calorie malnutrition, severe   Thrombocytopenia   Anemia due to chronic kidney disease   Essential hypertension   Chronic anemia   Chronic renal failure   Malnutrition of moderate degree   History of DVT (deep vein thrombosis)   Fracture, intertrochanteric, left femur    THIS SUMMARY TO BE USED ONLY FOR PATENT TRANSFER TO [place]. NOT MEANT TO BE USED IF DISCHARGED TO HOME OR SNF  RECOMMENDATIONS FOR OUTPATIENT FOLLOW UP: 1. He will need to follow-up with his orthopedic surgeon, Dr. Sharol Given in 2 weeks from 9/6.   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 Presented after a mechanical fall resulting in left hip fracture. He was hospitalized for further management. He underwent open reduction and internal fixation on 9/6. Has severe thrombocytopenia.  Consultants: Orthopedics, cardiology, nephrology, hematology  Procedures:  ORIF 9/6 Hemodialysis  2-D echocardiogram Study Conclusions -  Left ventricle: The cavity size was normal. There was severefocal basal hypertrophy of the septum. Systolic function wasnormal. The estimated ejection fraction was in the range of 55%to 60%. Wall motion was normal; there were no regional wallmotion abnormalities. Doppler parameters are consistent with abnormal left ventricular relaxation (grade 1 diastolicdysfunction). There was no evidence of elevated ventricularfilling pressure by Doppler parameters. - Aortic valve: There was mild stenosis. There was mildregurgitation. Valve area (VTI): 1.84 cm^2. Valve area (Vmax):1.47 cm^2. Valve area (Vmean): 1.49 cm^2. - Aortic root: The aortic root was normal in size. - Left atrium: The atrium was normal in size. - Right ventricle: Systolic function was normal. - Right atrium: The atrium was normal in size. - Tricuspid valve: There was mild regurgitation. - Pulmonary arteries: Systolic pressure was within the normalrange. - Inferior vena cava: The vessel was normal in size. - Pericardium, extracardiac: There was no pericardial effusion.   HOSPITAL COURSE:   Closed left hip fracture This was due to a mechanical fall. Patient was seen by orthopedics. Patient status post ORIF. Pain management. PT and OT has been following. Mobility is limited due to pain.  Pancytopenia with severe thrombocytopenia and anemia This is thought to be secondary to patient's immunosuppressive agents. Platelet counts are much lower than his baseline, which is between 50 and 100. Patient had seen hematology in the past. Hematology has been following closely. Patient was transfused multiple units of blood as well as platelets. Hematology recommends another unit of platelet transfusion today. There is some evidence for alloimmunization as her counts are not improving despite transfusion. Nephrologist, Dr. Jonnie Finner, discussed patient's condition with his cardiologist (Dr. Kirtland Bouchard) at [facility]. They recommended changes to his  immunosuppressive  medications. This includes holding the Imuran. Increasing the prednisone and decreasing the dose of sirolimus. Counts remained low. Hematology recommends transfer to [facility]. Have spoken to the cardiac transplant team and they have accepted to the patient in transfer.   History of DVT Patient was on Coumadin at home. However, now his platelet counts are significantly lower than his baseline. Per hematology hold all forms of anticoagulation for now as the risk of bleeding is much high compared to benefits.  History of Cardiac and renal transplant Continue immunosuppressive medication with changes as discussed above. Was seen by cardiology preoperatively. 2D echocardiogram as above. Last documented echo from 2015 at [facility] with EF 60-65%, mild AR. Pt is s/p cath in 09/2013 at [facility] with mod non-obstructive disease  ESRD on hemodialysis. Monday, Wednesday, Friday. S/p failed renal transplant. Nephrology is following. Patient was dialyzed as per his usual schedule.  Diabetes mellitus 2 with renal complications Continuing sliding scale insulin.  Constipation. Improved with Senokot  Recent groin infection. Patient completed a course of vancomycin.  Await transfer to [facility].   PERTINENT LABS:  The results of significant diagnostics from this hospitalization (including imaging, microbiology, ancillary and laboratory) are listed below for reference.    Microbiology: Recent Results (from the past 240 hour(s))  MRSA PCR Screening     Status: None   Collection Time: 01/30/15  8:45 AM  Result Value Ref Range Status   MRSA by PCR NEGATIVE NEGATIVE Final    Comment:        The GeneXpert MRSA Assay (FDA approved for NASAL specimens only), is one component of a comprehensive MRSA colonization surveillance program. It is not intended to diagnose MRSA infection nor to guide or monitor treatment for MRSA infections.      Labs: Basic  Metabolic Panel:  Recent Labs Lab 01/30/15 1300 01/30/15 2315 01/31/15 0522 01/31/15 1550 02/01/15 0930 02/02/15 2054 02/03/15 1015  NA 136 136 138 137 135 135 135  K 3.6 4.6 5.0 4.5 4.7 3.9 3.8  CL 98* 98* 100*  --  97* 97* 98*  CO2 30 30 28   --  30 30 29   GLUCOSE 113* 142* 106* 106* 102* 143* 148*  BUN 31* 15 19  --  36* 25* 32*  CREATININE 6.24* 3.68* 4.12*  --  5.78* 4.56* 5.38*  CALCIUM 8.3* 8.0* 8.3*  --  8.2* 8.4* 8.3*  PHOS 3.0  --   --   --  5.3* 3.7 3.9   Liver Function Tests:  Recent Labs Lab 01/30/15 1300 01/30/15 2315 02/01/15 0930 02/02/15 2054 02/03/15 1015  AST  --  25  --   --   --   ALT  --  18  --   --   --   ALKPHOS  --  69  --   --   --   BILITOT  --  1.9*  --   --   --   PROT  --  5.1*  --   --   --   ALBUMIN 2.2* 2.3* 1.9* 1.9* 1.8*   CBC:  Recent Labs Lab 01/30/15 0217 01/30/15 1144 01/30/15 2315  02/01/15 0930 02/02/15 0530 02/02/15 2054 02/03/15 0654 02/03/15 1015  WBC 3.8* 3.0* 2.9*  < > 2.3* 2.5* 2.1* 2.5* 2.4*  NEUTROABS 2.1 1.4* 1.7  --  1.2*  --   --   --  1.4*  HGB 7.4* 6.9* 9.4*  < > 7.1* 9.7* 9.0* 9.2* 8.1*  HCT 22.8* 21.2*  28.9*  < > 21.7* 29.4* 27.5* 28.9* 25.6*  MCV 90.1 88.7 88.7  < > 90.8 89.1 91.4 92.0 91.8  PLT 56* 34* 27*  < > 27* 26* 20* 22* 23*  < > = values in this interval not displayed.   CBG:  Recent Labs Lab 02/02/15 0542 02/02/15 1207 02/02/15 1815 02/03/15 0021 02/03/15 0603  GLUCAP 103* 145* 163* 141* 125*     IMAGING STUDIES Dg Chest 1 View  01/30/2015   CLINICAL DATA:  Patient fell this morning landing on the left side. Left hip pain and deformity.  EXAM: CHEST  1 VIEW  COMPARISON:  12/23/2014  FINDINGS: Postoperative changes in the mediastinum. Vascular grafts in the upper chest bilaterally. Calcified and tortuous aorta. Since the previous study, the central line has been removed. Shallow inspiration. Cardiac enlargement without significant vascular congestion or edema. No focal  consolidation. No blunting of costophrenic angles.  IMPRESSION: Cardiac enlargement.  No evidence of active pulmonary disease.   Electronically Signed   By: Lucienne Capers M.D.   On: 01/30/2015 04:14   Dg Tibia/fibula Left  01/30/2015   CLINICAL DATA:  Patient fell this morning, landing on the left side. Pain.  EXAM: LEFT TIBIA AND FIBULA - 2 VIEW  COMPARISON:  None.  FINDINGS: Diffuse bone demineralization. No evidence of acute fracture or dislocation. No focal bone lesion or bone destruction. Extensive vascular calcifications. Surgical clips in the soft tissues. Old periosteal reaction in the fibular shaft.  IMPRESSION: Diffuse bone demineralization.  No acute bony abnormalities.   Electronically Signed   By: Lucienne Capers M.D.   On: 01/30/2015 04:48   Dg Hand Complete Left  01/30/2015   CLINICAL DATA:  Patient fell this morning, landing on the left. Pain fifth metacarpal area.  EXAM: LEFT HAND - COMPLETE 3+ VIEW  COMPARISON:  None.  FINDINGS: Diffuse bone demineralization. Diffuse degenerative changes in the interphalangeal joints, first carpometacarpal and metacarpal phalangeal joints, and in the intercarpal joints. No acute fracture or dislocation demonstrated. Soft tissue swelling about the medial aspect of the left hand. Vascular calcifications.  IMPRESSION: Diffuse bone demineralization and degenerative changes. No acute bony abnormalities.   Electronically Signed   By: Lucienne Capers M.D.   On: 01/30/2015 04:10   Dg Hip Operative Unilat With Pelvis Left  01/31/2015   CLINICAL DATA:  Status post ORIF of the left hip foreign intertrochanteric fracture  EXAM: OPERATIVE left HIP (WITH PELVIS IF PERFORMED) single AP portable VIEWS  TECHNIQUE: Fluoroscopic spot image(s) were submitted for interpretation post-operatively.  COMPARISON:  Preoperative study of January 30, 2015  FINDINGS: Reported fluoro time is 10 seconds. The single spot film reveals interval placement of a telescoping nail as well as  intra medullary rod. The alignment of the fracture fragments is anatomic.  IMPRESSION: There is no immediate postprocedure complication following ORIF of the left hip.   Electronically Signed   By: David  Martinique M.D.   On: 01/31/2015 16:18   Dg Hip Unilat With Pelvis 2-3 Views Left  01/30/2015   CLINICAL DATA:  Patient fell onto the left side this morning. Left hip pain and deformity.  EXAM: DG HIP (WITH OR WITHOUT PELVIS) 2-3V LEFT  COMPARISON:  None.  FINDINGS: Acute comminuted inter trochanteric fracture of the left hip with impaction and valgus orientation of the fracture fragments. Mildly displaced lesser trochanteric fragment. Prominent degenerative changes in both hips. No evidence of dislocation of the hip. The visualized pelvis appears intact. SI joints and symphysis  pubis are not displaced. Extensive vascular calcifications.  IMPRESSION: Acute comminuted inter trochanteric fracture of the left proximal femur with impaction and valgus orientation of the fracture fragments.   Electronically Signed   By: Lucienne Capers M.D.   On: 01/30/2015 04:15     Discharge Instructions    Weight bearing as tolerated    Complete by:  As directed   Laterality:  left  Extremity:  Lower           ALLERGIES:  Allergies  Allergen Reactions  . Lisinopril Swelling    Lips and tongue swell  . Niacin And Related Other (See Comments)    unknown  . Norvasc [Amlodipine Besylate] Rash    Flushing  . Penicillins Rash    Current Inpatient Medications:  Scheduled: . sodium chloride   Intravenous Once  . antiseptic oral rinse  7 mL Mouth Rinse BID  . atorvastatin  40 mg Oral QPM  . calcium acetate  667 mg Oral BID WC  . carvedilol  6.25 mg Oral QHS  . colesevelam  1,875 mg Oral BID WC  . darbepoetin (ARANESP) injection - DIALYSIS  200 mcg Intravenous Q Wed-HD  . doxercalciferol  3 mcg Intravenous Q M,W,F-HD  . feeding supplement  1 Container Oral BID BM  . feeding supplement (PRO-STAT SUGAR FREE 64)   30 mL Oral Q1500  .  HYDROmorphone (DILAUDID) injection  1 mg Intramuscular Once  . Influenza vac split quadrivalent PF  0.5 mL Intramuscular Tomorrow-1000  . insulin aspart  0-9 Units Subcutaneous Q6H  . isosorbide dinitrate  40 mg Oral 3 times per day  . pantoprazole  40 mg Oral Daily  . predniSONE  7.5 mg Oral Q breakfast  . senna-docusate  3 tablet Oral BID  . Sirolimus  0.5 mg Oral q morning - 10a   Continuous: . sodium chloride 10 mL/hr at 01/31/15 1432  . sodium chloride 10 mL/hr at 01/31/15 1815   SMO:LMBEML chloride, sodium chloride, acetaminophen **OR** acetaminophen, alteplase, HYDROcodone-acetaminophen, HYDROmorphone (DILAUDID) injection, lidocaine (PF), lidocaine-prilocaine, methocarbamol **OR** methocarbamol (ROBAXIN)  IV, metoCLOPramide **OR** metoCLOPramide (REGLAN) injection, ondansetron **OR** ondansetron (ZOFRAN) IV, oxyCODONE, oxyCODONE, pentafluoroprop-tetrafluoroeth   Follow-up Information    Follow up with DUDA,MARCUS V, MD In 2 weeks.   Specialty:  Orthopedic Surgery   Contact information:   [place] [address] [phone]        [facility] Pager [phone]  02/03/2015, 2:29 PM

## 2015-02-04 LAB — PREPARE PLATELET PHERESIS
UNIT DIVISION: 0
Unit division: 0
Unit division: 0

## 2015-02-06 LAB — GLUCOSE, CAPILLARY: Glucose-Capillary: 111 mg/dL — ABNORMAL HIGH (ref 65–99)

## 2015-02-07 LAB — SIROLIMUS LEVEL: SIROLIMUS (RAPAMYCIN): 2.7 ng/mL — AB (ref 3.0–20.0)

## 2015-02-09 ENCOUNTER — Encounter (HOSPITAL_COMMUNITY): Admission: RE | Payer: Self-pay | Source: Ambulatory Visit

## 2015-02-09 ENCOUNTER — Ambulatory Visit (HOSPITAL_COMMUNITY): Admission: RE | Admit: 2015-02-09 | Payer: Medicare Other | Source: Ambulatory Visit | Admitting: Gastroenterology

## 2015-02-09 HISTORY — DX: Other reduced mobility: Z74.09

## 2015-02-09 SURGERY — COLONOSCOPY WITH PROPOFOL
Anesthesia: Monitor Anesthesia Care

## 2015-02-25 DEATH — deceased

## 2015-05-03 ENCOUNTER — Ambulatory Visit: Payer: Medicare Other | Admitting: Family

## 2015-05-03 ENCOUNTER — Encounter (HOSPITAL_COMMUNITY): Payer: Medicare Other

## 2015-05-04 ENCOUNTER — Ambulatory Visit: Payer: Medicare Other | Admitting: Family

## 2015-05-04 ENCOUNTER — Encounter (HOSPITAL_COMMUNITY): Payer: Medicare Other

## 2015-09-08 ENCOUNTER — Telehealth: Payer: Self-pay | Admitting: Hematology and Oncology

## 2015-09-08 NOTE — Telephone Encounter (Signed)
s.w. pt wife....she advised that pt passed Aug 2016....emailed HIM

## 2015-09-11 NOTE — Telephone Encounter (Signed)
Patient status changed due to deceased per the message below.

## 2015-09-28 ENCOUNTER — Ambulatory Visit: Payer: Medicare Other | Admitting: Hematology and Oncology

## 2015-09-28 ENCOUNTER — Other Ambulatory Visit: Payer: Medicare Other

## 2016-06-22 IMAGING — CT CT CHEST W/O CM
2 of 4 series · 15 of 36 positions shown, 18 images · IV contrast (Omnipaque 300)
Comparison: 05/06/2014.

CLINICAL DATA: Followup health care associated pneumonia.

EXAM:
CT CHEST WITHOUT CONTRAST
TECHNIQUE: Multidetector CT imaging of the chest was performed following the
standard protocol without IV contrast..

[Series 2: chest routine with · axial · 0.69mm/px · z∈[-304,-38]mm · 12 of 63 slices shown, 15 images]
[im 5/63  mediastinal]
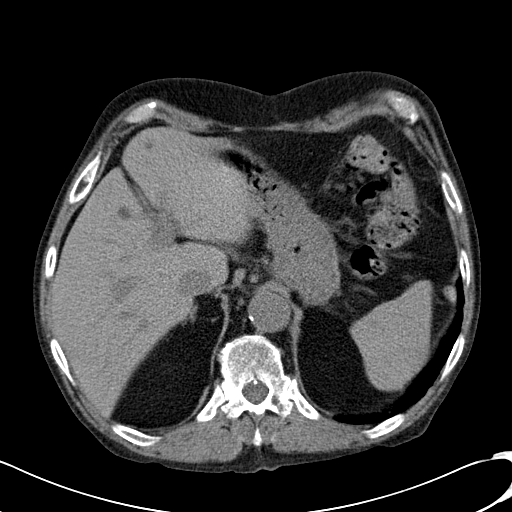
[im 5/63  lung]
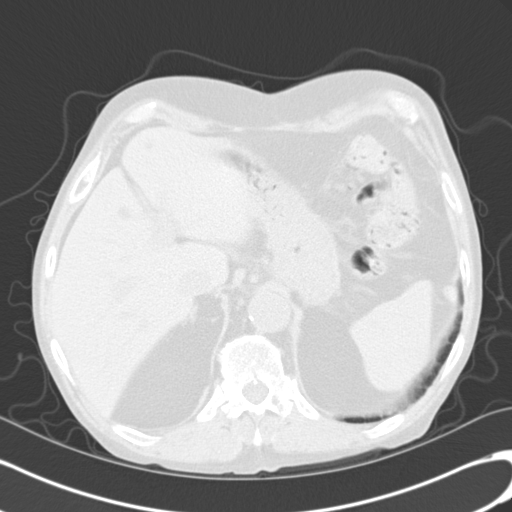
[im 10/63  lung]
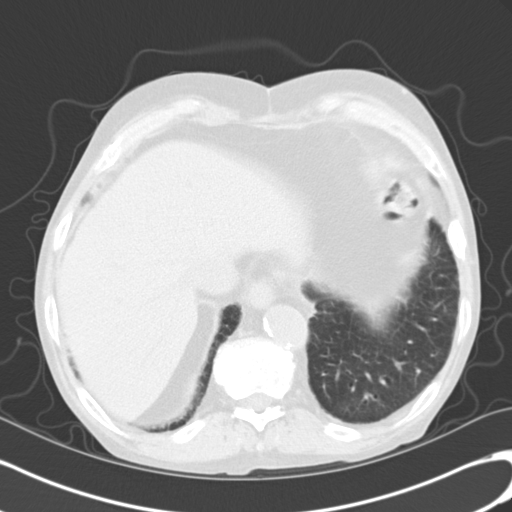
[im 15/63  lung]
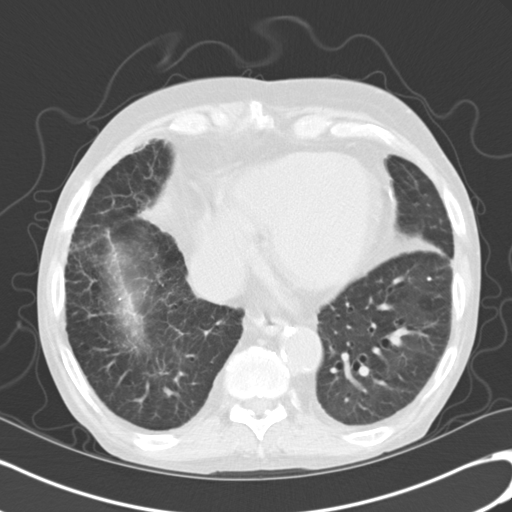
[im 20/63  lung]
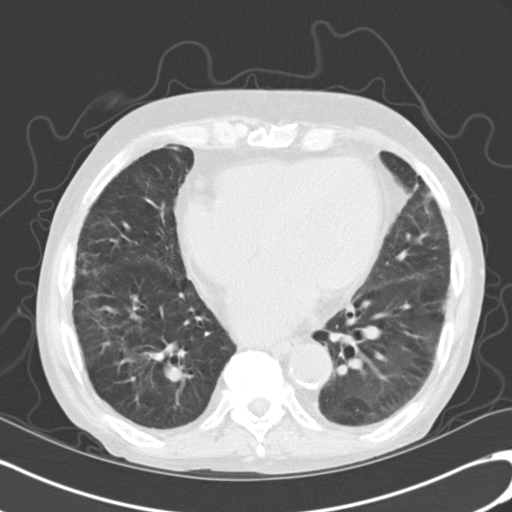
[im 24/63  mediastinal]
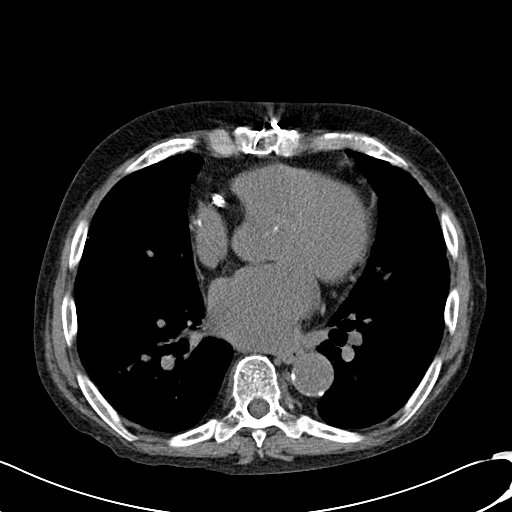
[im 24/63  lung]
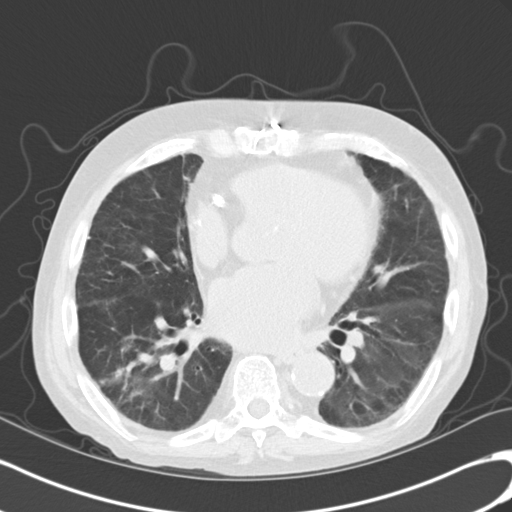
[im 29/63  lung]
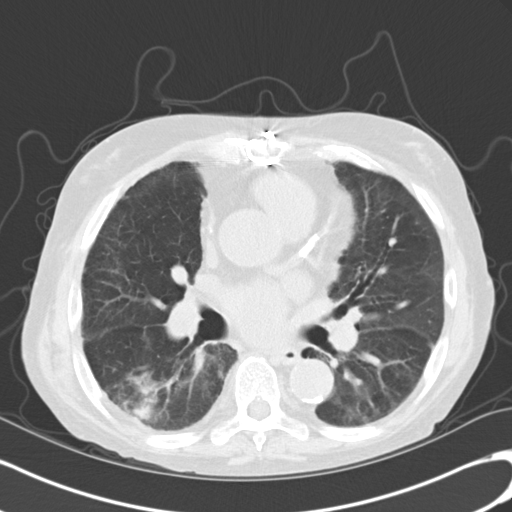
[im 34/63  lung]
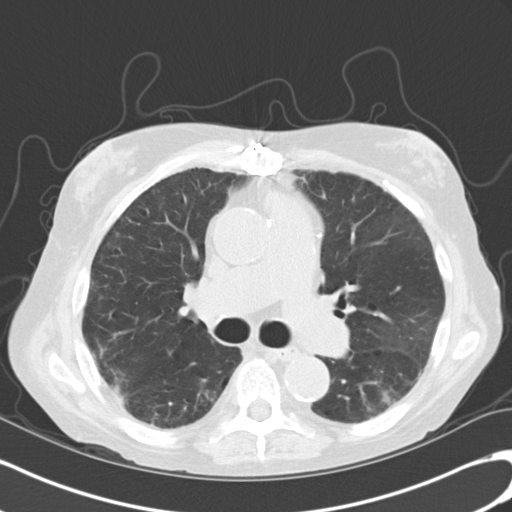
[im 39/63  lung]
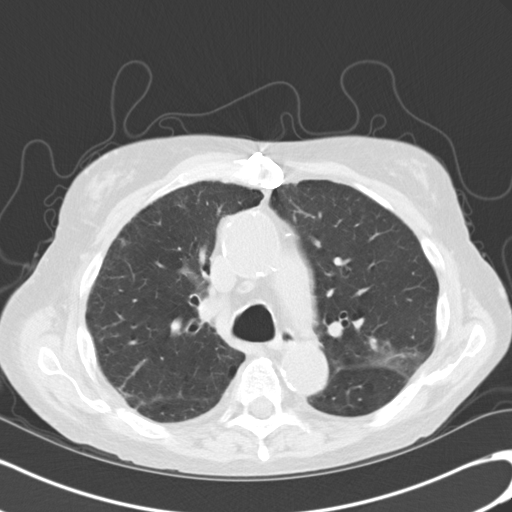
[im 43/63  mediastinal]
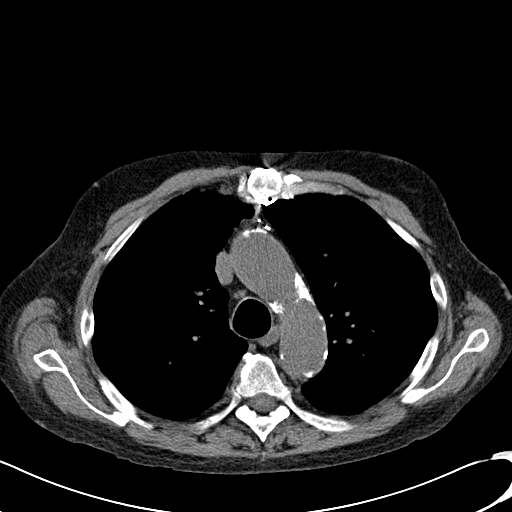
[im 43/63  lung]
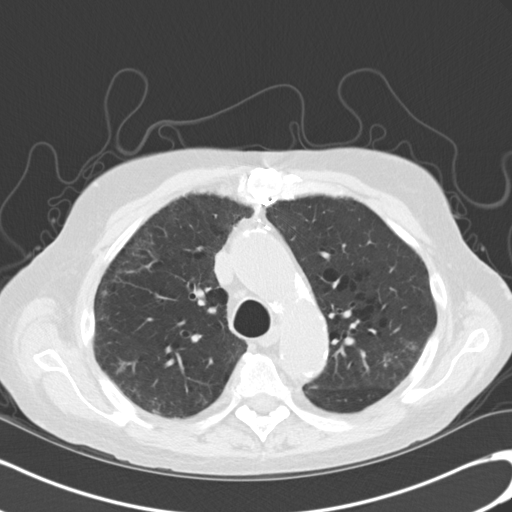
[im 48/63  lung]
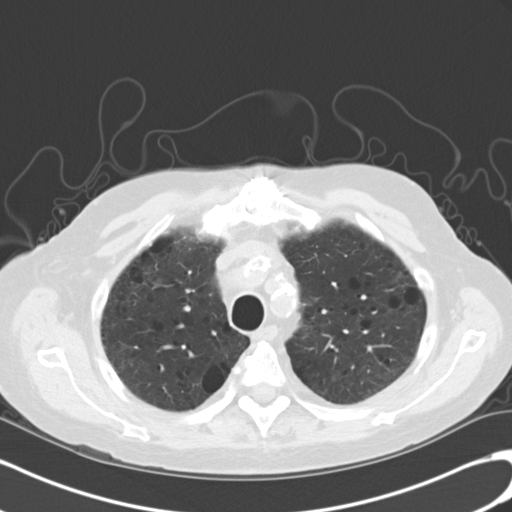
[im 53/63  lung]
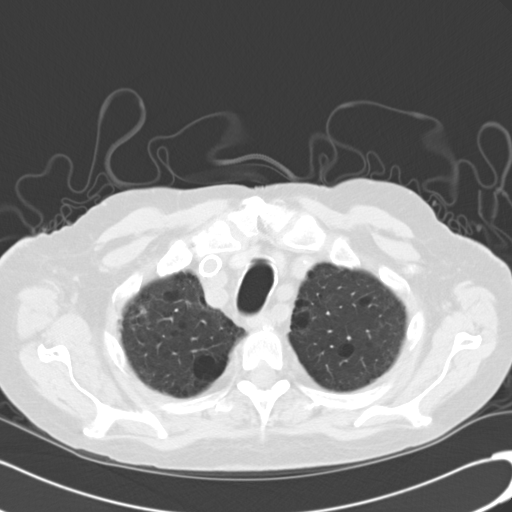
[im 58/63  lung]
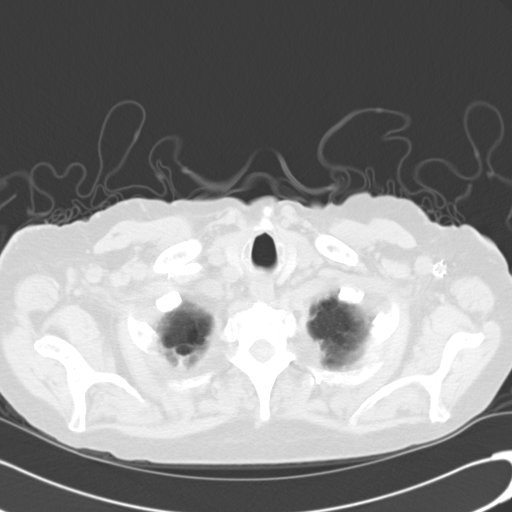

[Series 602: cor · coronal · 0.69mm/px · 3 of 109 slices shown]
[im 22/109  lung]
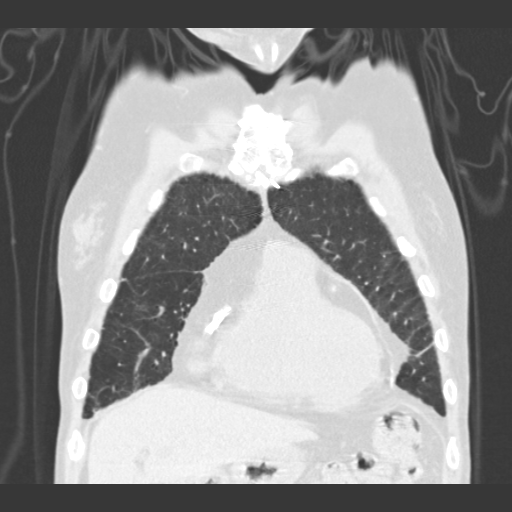
[im 44/109  lung]
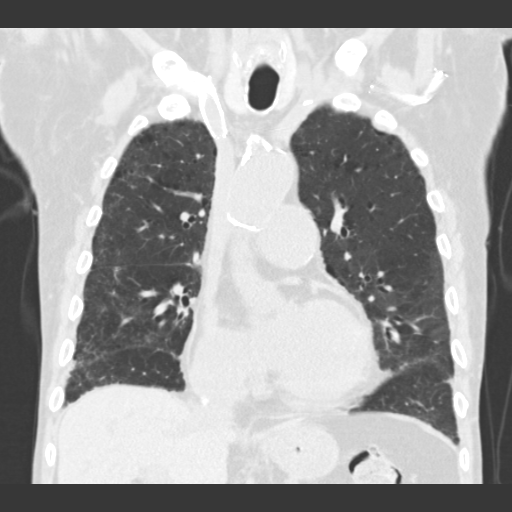
[im 65/109  lung]
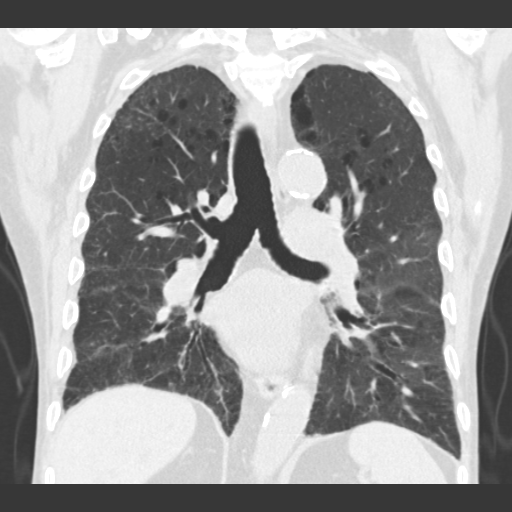

[15 of 36 positions shown; findings below may reference images not displayed]

FINDINGS: Mediastinum: Previous median sternotomy and CABG procedure. There is
moderate cardiac enlargement. No pericardial effusion. No enlarged
mediastinal or hilar lymph nodes. Stent is identified within the
right subclavian vein.

Lungs/Pleura: No pleural effusion. Moderate changes of centrilobular
and paraseptal emphysema identified. There is basilar predominant
subpleural reticulation identified within the right lung.
Significantly improved aeration to both lower lobes identified.
Residual subsegmental atelectasis/consolidation is noted in the
right lower lobe, image 34/series 3. Patchy area of ground-glass
attenuation is noted in the left upper low in the area of previous
airspace consolidation.

Upper Abdomen: Cysts within the liver are unchanged. The visualized
portions of the spleen and adrenal glands are normal. Right renal
cysts again noted.

Musculoskeletal: Review of the visualized osseous structures is
significant for thoracic spondylosis. No aggressive lytic or
sclerotic bone lesions.
IMPRESSION: 1. Interval improved aeration of both lungs compatible with
resolving pneumonia.
2. Emphysema
3. Cardiac enlargement.  Previous CABG procedure.
